# Patient Record
Sex: Female | Born: 1965 | Race: Black or African American | Hispanic: No | Marital: Single | Smoking: Former smoker
Health system: Southern US, Community
[De-identification: ages and names within clinical notes are randomized; demographics above are authoritative.]

## PROBLEM LIST (undated history)

## (undated) DIAGNOSIS — R1310 Dysphagia, unspecified: Secondary | ICD-10-CM

## (undated) DIAGNOSIS — G40909 Epilepsy, unspecified, not intractable, without status epilepticus: Secondary | ICD-10-CM

## (undated) DIAGNOSIS — K219 Gastro-esophageal reflux disease without esophagitis: Secondary | ICD-10-CM

## (undated) DIAGNOSIS — E119 Type 2 diabetes mellitus without complications: Secondary | ICD-10-CM

## (undated) DIAGNOSIS — I2699 Other pulmonary embolism without acute cor pulmonale: Secondary | ICD-10-CM

## (undated) DIAGNOSIS — E785 Hyperlipidemia, unspecified: Secondary | ICD-10-CM

## (undated) DIAGNOSIS — I82409 Acute embolism and thrombosis of unspecified deep veins of unspecified lower extremity: Secondary | ICD-10-CM

## (undated) DIAGNOSIS — I10 Essential (primary) hypertension: Secondary | ICD-10-CM

## (undated) DIAGNOSIS — I609 Nontraumatic subarachnoid hemorrhage, unspecified: Secondary | ICD-10-CM

## (undated) DIAGNOSIS — J9600 Acute respiratory failure, unspecified whether with hypoxia or hypercapnia: Secondary | ICD-10-CM

## (undated) DIAGNOSIS — K589 Irritable bowel syndrome without diarrhea: Secondary | ICD-10-CM

## (undated) DIAGNOSIS — R339 Retention of urine, unspecified: Secondary | ICD-10-CM

## (undated) HISTORY — PX: TRACHEOSTOMY: SUR1362

## (undated) HISTORY — PX: ANEURYSM COILING: SHX5349

## (undated) HISTORY — PX: ABDOMINAL SURGERY: SHX537

## (undated) HISTORY — DX: Other pulmonary embolism without acute cor pulmonale: I26.99

## (undated) HISTORY — DX: Nontraumatic subarachnoid hemorrhage, unspecified: I60.9

## (undated) HISTORY — DX: Acute embolism and thrombosis of unspecified deep veins of unspecified lower extremity: I82.409

---

## 2005-08-30 ENCOUNTER — Ambulatory Visit: Payer: Self-pay | Admitting: Internal Medicine

## 2005-08-30 ENCOUNTER — Ambulatory Visit (HOSPITAL_COMMUNITY): Admission: RE | Admit: 2005-08-30 | Discharge: 2005-09-02 | Payer: Self-pay | Admitting: Emergency Medicine

## 2005-08-30 ENCOUNTER — Inpatient Hospital Stay (HOSPITAL_COMMUNITY): Admission: EM | Admit: 2005-08-30 | Discharge: 2005-09-02 | Payer: Self-pay | Admitting: Emergency Medicine

## 2005-09-10 ENCOUNTER — Ambulatory Visit: Payer: Self-pay | Admitting: Internal Medicine

## 2005-09-18 ENCOUNTER — Encounter: Admission: RE | Admit: 2005-09-18 | Discharge: 2005-09-18 | Payer: Self-pay | Admitting: Obstetrics and Gynecology

## 2005-09-23 ENCOUNTER — Observation Stay (HOSPITAL_COMMUNITY): Admission: AD | Admit: 2005-09-23 | Discharge: 2005-09-24 | Payer: Self-pay | Admitting: Obstetrics and Gynecology

## 2005-09-26 ENCOUNTER — Ambulatory Visit: Payer: Self-pay | Admitting: Gynecology

## 2005-09-26 ENCOUNTER — Inpatient Hospital Stay (HOSPITAL_COMMUNITY): Admission: RE | Admit: 2005-09-26 | Discharge: 2005-09-28 | Payer: Self-pay | Admitting: Gynecology

## 2005-10-05 ENCOUNTER — Ambulatory Visit: Payer: Self-pay | Admitting: Gynecology

## 2005-10-25 ENCOUNTER — Ambulatory Visit: Payer: Self-pay | Admitting: Obstetrics & Gynecology

## 2015-01-13 ENCOUNTER — Encounter (HOSPITAL_COMMUNITY): Payer: Self-pay | Admitting: Nurse Practitioner

## 2015-01-13 ENCOUNTER — Encounter (HOSPITAL_COMMUNITY)
Admission: EM | Disposition: A | Discharge status: Skilled Nursing Facility | Payer: Self-pay | Source: Home / Self Care | Attending: Pulmonary Disease

## 2015-01-13 ENCOUNTER — Inpatient Hospital Stay (HOSPITAL_COMMUNITY)
Admission: EM | Admit: 2015-01-13 | Discharge: 2015-02-14 | DRG: 003 | Disposition: A | Discharge status: Skilled Nursing Facility | Payer: Medicaid Other | Attending: Internal Medicine | Admitting: Internal Medicine

## 2015-01-13 ENCOUNTER — Inpatient Hospital Stay (HOSPITAL_COMMUNITY): Payer: Medicaid Other

## 2015-01-13 ENCOUNTER — Inpatient Hospital Stay (HOSPITAL_COMMUNITY): Payer: Medicaid Other | Admitting: Anesthesiology

## 2015-01-13 ENCOUNTER — Emergency Department (HOSPITAL_COMMUNITY): Payer: Medicaid Other

## 2015-01-13 DIAGNOSIS — I119 Hypertensive heart disease without heart failure: Secondary | ICD-10-CM | POA: Diagnosis present

## 2015-01-13 DIAGNOSIS — I739 Peripheral vascular disease, unspecified: Secondary | ICD-10-CM | POA: Diagnosis present

## 2015-01-13 DIAGNOSIS — R1310 Dysphagia, unspecified: Secondary | ICD-10-CM | POA: Diagnosis present

## 2015-01-13 DIAGNOSIS — T380X5A Adverse effect of glucocorticoids and synthetic analogues, initial encounter: Secondary | ICD-10-CM | POA: Diagnosis present

## 2015-01-13 DIAGNOSIS — R748 Abnormal levels of other serum enzymes: Secondary | ICD-10-CM

## 2015-01-13 DIAGNOSIS — B963 Hemophilus influenzae [H. influenzae] as the cause of diseases classified elsewhere: Secondary | ICD-10-CM | POA: Diagnosis not present

## 2015-01-13 DIAGNOSIS — R110 Nausea: Secondary | ICD-10-CM

## 2015-01-13 DIAGNOSIS — D696 Thrombocytopenia, unspecified: Secondary | ICD-10-CM | POA: Diagnosis present

## 2015-01-13 DIAGNOSIS — R509 Fever, unspecified: Secondary | ICD-10-CM

## 2015-01-13 DIAGNOSIS — D649 Anemia, unspecified: Secondary | ICD-10-CM | POA: Diagnosis present

## 2015-01-13 DIAGNOSIS — I610 Nontraumatic intracerebral hemorrhage in hemisphere, subcortical: Secondary | ICD-10-CM | POA: Diagnosis not present

## 2015-01-13 DIAGNOSIS — R569 Unspecified convulsions: Secondary | ICD-10-CM | POA: Diagnosis present

## 2015-01-13 DIAGNOSIS — E872 Acidosis: Secondary | ICD-10-CM | POA: Diagnosis present

## 2015-01-13 DIAGNOSIS — F17200 Nicotine dependence, unspecified, uncomplicated: Secondary | ICD-10-CM | POA: Diagnosis present

## 2015-01-13 DIAGNOSIS — R7989 Other specified abnormal findings of blood chemistry: Secondary | ICD-10-CM | POA: Diagnosis not present

## 2015-01-13 DIAGNOSIS — J40 Bronchitis, not specified as acute or chronic: Secondary | ICD-10-CM | POA: Diagnosis not present

## 2015-01-13 DIAGNOSIS — I10 Essential (primary) hypertension: Secondary | ICD-10-CM | POA: Diagnosis not present

## 2015-01-13 DIAGNOSIS — A047 Enterocolitis due to Clostridium difficile: Secondary | ICD-10-CM | POA: Diagnosis not present

## 2015-01-13 DIAGNOSIS — Z66 Do not resuscitate: Secondary | ICD-10-CM | POA: Diagnosis not present

## 2015-01-13 DIAGNOSIS — I609 Nontraumatic subarachnoid hemorrhage, unspecified: Secondary | ICD-10-CM | POA: Insufficient documentation

## 2015-01-13 DIAGNOSIS — J9621 Acute and chronic respiratory failure with hypoxia: Secondary | ICD-10-CM | POA: Insufficient documentation

## 2015-01-13 DIAGNOSIS — I959 Hypotension, unspecified: Secondary | ICD-10-CM | POA: Diagnosis not present

## 2015-01-13 DIAGNOSIS — J1100 Influenza due to unidentified influenza virus with unspecified type of pneumonia: Secondary | ICD-10-CM | POA: Diagnosis not present

## 2015-01-13 DIAGNOSIS — G936 Cerebral edema: Secondary | ICD-10-CM | POA: Diagnosis not present

## 2015-01-13 DIAGNOSIS — J9602 Acute respiratory failure with hypercapnia: Secondary | ICD-10-CM | POA: Diagnosis present

## 2015-01-13 DIAGNOSIS — I161 Hypertensive emergency: Secondary | ICD-10-CM | POA: Diagnosis present

## 2015-01-13 DIAGNOSIS — E876 Hypokalemia: Secondary | ICD-10-CM | POA: Diagnosis present

## 2015-01-13 DIAGNOSIS — Z9289 Personal history of other medical treatment: Secondary | ICD-10-CM | POA: Insufficient documentation

## 2015-01-13 DIAGNOSIS — R40242 Glasgow coma scale score 9-12, unspecified time: Secondary | ICD-10-CM | POA: Diagnosis not present

## 2015-01-13 DIAGNOSIS — T829XXA Unspecified complication of cardiac and vascular prosthetic device, implant and graft, initial encounter: Secondary | ICD-10-CM | POA: Insufficient documentation

## 2015-01-13 DIAGNOSIS — R739 Hyperglycemia, unspecified: Secondary | ICD-10-CM | POA: Diagnosis present

## 2015-01-13 DIAGNOSIS — Z9114 Patient's other noncompliance with medication regimen: Secondary | ICD-10-CM | POA: Diagnosis not present

## 2015-01-13 DIAGNOSIS — R4020 Unspecified coma: Secondary | ICD-10-CM | POA: Diagnosis present

## 2015-01-13 DIAGNOSIS — I602 Nontraumatic subarachnoid hemorrhage from anterior communicating artery: Secondary | ICD-10-CM | POA: Diagnosis present

## 2015-01-13 DIAGNOSIS — Z452 Encounter for adjustment and management of vascular access device: Secondary | ICD-10-CM

## 2015-01-13 DIAGNOSIS — B9620 Unspecified Escherichia coli [E. coli] as the cause of diseases classified elsewhere: Secondary | ICD-10-CM | POA: Diagnosis not present

## 2015-01-13 DIAGNOSIS — R339 Retention of urine, unspecified: Secondary | ICD-10-CM | POA: Diagnosis not present

## 2015-01-13 DIAGNOSIS — I619 Nontraumatic intracerebral hemorrhage, unspecified: Secondary | ICD-10-CM

## 2015-01-13 DIAGNOSIS — J9601 Acute respiratory failure with hypoxia: Secondary | ICD-10-CM | POA: Diagnosis present

## 2015-01-13 DIAGNOSIS — I248 Other forms of acute ischemic heart disease: Secondary | ICD-10-CM | POA: Diagnosis present

## 2015-01-13 DIAGNOSIS — Z4659 Encounter for fitting and adjustment of other gastrointestinal appliance and device: Secondary | ICD-10-CM

## 2015-01-13 DIAGNOSIS — N390 Urinary tract infection, site not specified: Secondary | ICD-10-CM | POA: Diagnosis not present

## 2015-01-13 DIAGNOSIS — J9690 Respiratory failure, unspecified, unspecified whether with hypoxia or hypercapnia: Secondary | ICD-10-CM

## 2015-01-13 DIAGNOSIS — G919 Hydrocephalus, unspecified: Secondary | ICD-10-CM

## 2015-01-13 DIAGNOSIS — G9340 Encephalopathy, unspecified: Secondary | ICD-10-CM

## 2015-01-13 DIAGNOSIS — R29818 Other symptoms and signs involving the nervous system: Secondary | ICD-10-CM | POA: Insufficient documentation

## 2015-01-13 DIAGNOSIS — Z930 Tracheostomy status: Secondary | ICD-10-CM | POA: Insufficient documentation

## 2015-01-13 DIAGNOSIS — R4182 Altered mental status, unspecified: Secondary | ICD-10-CM | POA: Diagnosis present

## 2015-01-13 HISTORY — DX: Essential (primary) hypertension: I10

## 2015-01-13 HISTORY — PX: RADIOLOGY WITH ANESTHESIA: SHX6223

## 2015-01-13 LAB — LACTIC ACID, PLASMA: LACTIC ACID, VENOUS: 5.5 mmol/L — AB (ref 0.5–2.0)

## 2015-01-13 LAB — COMPREHENSIVE METABOLIC PANEL
ALBUMIN: 4 g/dL (ref 3.5–5.0)
ALK PHOS: 104 U/L (ref 38–126)
ALT: 26 U/L (ref 14–54)
AST: 33 U/L (ref 15–41)
Anion gap: 16 — ABNORMAL HIGH (ref 5–15)
BILIRUBIN TOTAL: 0.6 mg/dL (ref 0.3–1.2)
BUN: 13 mg/dL (ref 6–20)
CALCIUM: 9.5 mg/dL (ref 8.9–10.3)
CO2: 20 mmol/L — AB (ref 22–32)
CREATININE: 1.11 mg/dL — AB (ref 0.44–1.00)
Chloride: 102 mmol/L (ref 101–111)
GFR calc Af Amer: >60 mL/min (ref >60)
GFR calc non Af Amer: 56 mL/min — ABNORMAL LOW (ref >60)
GLUCOSE: 262 mg/dL — AB (ref 65–99)
Potassium: 3.1 mmol/L — ABNORMAL LOW (ref 3.5–5.1)
SODIUM: 138 mmol/L (ref 135–145)
TOTAL PROTEIN: 7.2 g/dL (ref 6.5–8.1)

## 2015-01-13 LAB — URINALYSIS, ROUTINE W REFLEX MICROSCOPIC
Bilirubin Urine: NEGATIVE
GLUCOSE, UA: 500 mg/dL — AB
Ketones, ur: NEGATIVE mg/dL
LEUKOCYTES UA: NEGATIVE
Nitrite: NEGATIVE
PH: 6 (ref 5.0–8.0)
SPECIFIC GRAVITY, URINE: 1.011 (ref 1.005–1.030)

## 2015-01-13 LAB — RAPID URINE DRUG SCREEN, HOSP PERFORMED
AMPHETAMINES: NOT DETECTED
Barbiturates: NOT DETECTED
Benzodiazepines: POSITIVE — AB
COCAINE: NOT DETECTED
OPIATES: NOT DETECTED
TETRAHYDROCANNABINOL: POSITIVE — AB

## 2015-01-13 LAB — CBC WITH DIFFERENTIAL/PLATELET
BASOS ABS: 0.2 10*3/uL — AB (ref 0.0–0.1)
BASOS PCT: 1 %
EOS ABS: 0.2 10*3/uL (ref 0.0–0.7)
Eosinophils Relative: 1 %
HCT: 44.1 % (ref 36.0–46.0)
HEMOGLOBIN: 14.4 g/dL (ref 12.0–15.0)
LYMPHS ABS: 6.5 10*3/uL — AB (ref 0.7–4.0)
LYMPHS PCT: 43 %
MCH: 31.1 pg (ref 26.0–34.0)
MCHC: 32.7 g/dL (ref 30.0–36.0)
MCV: 95.2 fL (ref 78.0–100.0)
MONO ABS: 1.2 10*3/uL — AB (ref 0.1–1.0)
Monocytes Relative: 8 %
NEUTROS ABS: 6.9 10*3/uL (ref 1.7–7.7)
Neutrophils Relative %: 47 %
Platelets: 246 10*3/uL (ref 150–400)
RBC: 4.63 MIL/uL (ref 3.87–5.11)
RDW: 14.2 % (ref 11.5–15.5)
WBC: 15 10*3/uL — ABNORMAL HIGH (ref 4.0–10.5)

## 2015-01-13 LAB — CBC
HCT: 43.2 % (ref 36.0–46.0)
HEMOGLOBIN: 14.1 g/dL (ref 12.0–15.0)
MCH: 31.1 pg (ref 26.0–34.0)
MCHC: 32.6 g/dL (ref 30.0–36.0)
MCV: 95.2 fL (ref 78.0–100.0)
Platelets: 214 10*3/uL (ref 150–400)
RBC: 4.54 MIL/uL (ref 3.87–5.11)
RDW: 14.2 % (ref 11.5–15.5)
WBC: 25.8 10*3/uL — ABNORMAL HIGH (ref 4.0–10.5)

## 2015-01-13 LAB — I-STAT ARTERIAL BLOOD GAS, ED
ACID-BASE DEFICIT: 9 mmol/L — AB (ref 0.0–2.0)
BICARBONATE: 21.1 meq/L (ref 20.0–24.0)
O2 SAT: 92 %
PO2 ART: 82 mmHg (ref 80.0–100.0)
Patient temperature: 36.6
TCO2: 23 mmol/L (ref 0–100)
pCO2 arterial: 59.8 mmHg (ref 35.0–45.0)
pH, Arterial: 7.152 — CL (ref 7.350–7.450)

## 2015-01-13 LAB — POCT I-STAT 7, (LYTES, BLD GAS, ICA,H+H)
ACID-BASE DEFICIT: 5 mmol/L — AB (ref 0.0–2.0)
BICARBONATE: 20 meq/L (ref 20.0–24.0)
Calcium, Ion: 1.06 mmol/L — ABNORMAL LOW (ref 1.12–1.23)
HCT: 37 % (ref 36.0–46.0)
Hemoglobin: 12.6 g/dL (ref 12.0–15.0)
O2 SAT: 99 %
PCO2 ART: 34.9 mmHg — AB (ref 35.0–45.0)
PH ART: 7.367 (ref 7.350–7.450)
PO2 ART: 122 mmHg — AB (ref 80.0–100.0)
POTASSIUM: 4.1 mmol/L (ref 3.5–5.1)
Sodium: 140 mmol/L (ref 135–145)
TCO2: 21 mmol/L (ref 0–100)

## 2015-01-13 LAB — PROTIME-INR
INR: 1.07 (ref 0.00–1.49)
PROTHROMBIN TIME: 14.1 s (ref 11.6–15.2)

## 2015-01-13 LAB — URINE MICROSCOPIC-ADD ON: Bacteria, UA: NONE SEEN

## 2015-01-13 LAB — PHOSPHORUS: Phosphorus: 2.3 mg/dL — ABNORMAL LOW (ref 2.5–4.6)

## 2015-01-13 LAB — MRSA PCR SCREENING: MRSA BY PCR: NEGATIVE

## 2015-01-13 LAB — I-STAT TROPONIN, ED: Troponin i, poc: 0.07 ng/mL (ref 0.00–0.08)

## 2015-01-13 LAB — MAGNESIUM: Magnesium: 1.7 mg/dL (ref 1.7–2.4)

## 2015-01-13 LAB — APTT: APTT: 28 s (ref 24–37)

## 2015-01-13 LAB — TROPONIN I
TROPONIN I: 2.29 ng/mL — AB (ref <0.031)
Troponin I: 1.51 ng/mL (ref <0.031)

## 2015-01-13 LAB — I-STAT CG4 LACTIC ACID, ED: LACTIC ACID, VENOUS: 7.95 mmol/L — AB (ref 0.5–2.0)

## 2015-01-13 SURGERY — RADIOLOGY WITH ANESTHESIA
Anesthesia: General

## 2015-01-13 MED ORDER — PROPOFOL 1000 MG/100ML IV EMUL
5.0000 ug/kg/min | Freq: Once | INTRAVENOUS | Status: DC
  Administered 2015-01-13: 10 ug/kg/min via INTRAVENOUS

## 2015-01-13 MED ORDER — LEVETIRACETAM 500 MG/5ML IV SOLN
500.0000 mg | Freq: Two times a day (BID) | INTRAVENOUS | Status: DC
  Filled 2015-01-13: qty 5

## 2015-01-13 MED ORDER — ETOMIDATE 2 MG/ML IV SOLN
INTRAVENOUS | Status: AC | PRN
  Administered 2015-01-13: 25 mg via INTRAVENOUS

## 2015-01-13 MED ORDER — ANTISEPTIC ORAL RINSE SOLUTION (CORINZ)
7.0000 mL | OROMUCOSAL | Status: DC
  Administered 2015-01-13 – 2015-01-28 (×144): 7 mL via OROMUCOSAL

## 2015-01-13 MED ORDER — NICARDIPINE HCL IN NACL 20-0.86 MG/200ML-% IV SOLN
INTRAVENOUS | Status: AC
  Administered 2015-01-13: 7.5 mg
  Filled 2015-01-13: qty 200

## 2015-01-13 MED ORDER — ONDANSETRON HCL 4 MG/2ML IJ SOLN
INTRAMUSCULAR | Status: AC
  Filled 2015-01-13: qty 2

## 2015-01-13 MED ORDER — IOHEXOL 300 MG/ML  SOLN
300.0000 mL | Freq: Once | INTRAMUSCULAR | Status: AC | PRN
  Administered 2015-01-13: 80 mL via INTRAVENOUS

## 2015-01-13 MED ORDER — PROPOFOL 1000 MG/100ML IV EMUL
INTRAVENOUS | Status: AC
  Administered 2015-01-13: 40 ug/kg/min via INTRAVENOUS
  Filled 2015-01-13: qty 100

## 2015-01-13 MED ORDER — SODIUM CHLORIDE 0.9 % IV SOLN
500.0000 mg | Freq: Two times a day (BID) | INTRAVENOUS | Status: DC
  Administered 2015-01-13 – 2015-01-15 (×4): 500 mg via INTRAVENOUS
  Filled 2015-01-13 (×5): qty 5

## 2015-01-13 MED ORDER — PANTOPRAZOLE SODIUM 40 MG IV SOLR
40.0000 mg | Freq: Every day | INTRAVENOUS | Status: DC
  Administered 2015-01-13 – 2015-01-14 (×2): 40 mg via INTRAVENOUS
  Filled 2015-01-13 (×2): qty 40

## 2015-01-13 MED ORDER — CEFAZOLIN SODIUM 1-5 GM-% IV SOLN
1.0000 g | Freq: Three times a day (TID) | INTRAVENOUS | Status: DC
  Administered 2015-01-13: 2 g via INTRAVENOUS
  Administered 2015-01-14 – 2015-01-15 (×5): 1 g via INTRAVENOUS
  Filled 2015-01-13 (×8): qty 50

## 2015-01-13 MED ORDER — DEXAMETHASONE SODIUM PHOSPHATE 4 MG/ML IJ SOLN
4.0000 mg | Freq: Four times a day (QID) | INTRAMUSCULAR | Status: DC
  Administered 2015-01-13 – 2015-01-27 (×55): 4 mg via INTRAVENOUS
  Filled 2015-01-13 (×55): qty 1

## 2015-01-13 MED ORDER — CHLORHEXIDINE GLUCONATE 0.12% ORAL RINSE (MEDLINE KIT)
15.0000 mL | Freq: Two times a day (BID) | OROMUCOSAL | Status: DC
  Administered 2015-01-13 – 2015-01-27 (×29): 15 mL via OROMUCOSAL

## 2015-01-13 MED ORDER — PROPOFOL 10 MG/ML IV BOLUS
INTRAVENOUS | Status: AC | PRN
  Administered 2015-01-13: 30 mg via INTRAVENOUS

## 2015-01-13 MED ORDER — DEXAMETHASONE SODIUM PHOSPHATE 4 MG/ML IJ SOLN: 4.0000 mg | Freq: Four times a day (QID) | INTRAMUSCULAR | Status: DC

## 2015-01-13 MED ORDER — CEFAZOLIN (ANCEF) 1 G IV SOLR: 1.0000 g | Freq: Three times a day (TID) | INTRAVENOUS | Status: DC

## 2015-01-13 MED ORDER — NICARDIPINE HCL IN NACL 20-0.86 MG/200ML-% IV SOLN
3.0000 mg/h | INTRAVENOUS | Status: DC
  Administered 2015-01-13: 7.5 mg/h via INTRAVENOUS
  Administered 2015-01-14: 10 mg/h via INTRAVENOUS
  Filled 2015-01-13 (×2): qty 200

## 2015-01-13 MED ORDER — DEXTROSE 5 % IV SOLN
10.0000 mg | INTRAVENOUS | Status: DC | PRN
  Administered 2015-01-13: 25 ug/min via INTRAVENOUS

## 2015-01-13 MED ORDER — ALBUMIN HUMAN 25 % IV SOLN
25.0000 g | Freq: Four times a day (QID) | INTRAVENOUS | Status: DC
  Administered 2015-01-13 – 2015-01-14 (×3): 25 g via INTRAVENOUS
  Filled 2015-01-13 (×11): qty 100

## 2015-01-13 MED ORDER — NICARDIPINE HCL IN NACL 20-0.86 MG/200ML-% IV SOLN
3.0000 mg/h | Freq: Once | INTRAVENOUS | Status: AC
  Administered 2015-01-13: 5 mg/h via INTRAVENOUS
  Filled 2015-01-13: qty 200

## 2015-01-13 MED ORDER — NICARDIPINE HCL IN NACL 20-0.86 MG/200ML-% IV SOLN
INTRAVENOUS | Status: DC | PRN
  Administered 2015-01-13 (×2): 5 mg/h via INTRAVENOUS

## 2015-01-13 MED ORDER — FENTANYL CITRATE (PF) 100 MCG/2ML IJ SOLN
100.0000 ug | Freq: Once | INTRAMUSCULAR | Status: AC
  Administered 2015-01-13: 100 ug via INTRAVENOUS

## 2015-01-13 MED ORDER — MANNITOL 25 % IV SOLN
25.0000 g | Freq: Four times a day (QID) | INTRAVENOUS | Status: DC
Start: 2015-01-13 — End: 2015-01-14
  Administered 2015-01-13 – 2015-01-14 (×2): 25 g via INTRAVENOUS
  Filled 2015-01-13: qty 50
  Filled 2015-01-13: qty 100
  Filled 2015-01-13 (×2): qty 50

## 2015-01-13 MED ORDER — SODIUM BICARBONATE 4.2 % IV SOLN
INTRAVENOUS | Status: DC | PRN
  Administered 2015-01-13: 50 meq via INTRAVENOUS

## 2015-01-13 MED ORDER — DILTIAZEM HCL 100 MG IV SOLR
INTRAVENOUS | Status: AC
  Filled 2015-01-13: qty 100

## 2015-01-13 MED ORDER — ROCURONIUM BROMIDE 50 MG/5ML IV SOLN
INTRAVENOUS | Status: AC | PRN
  Administered 2015-01-13: 80 mg via INTRAVENOUS

## 2015-01-13 MED ORDER — DEXAMETHASONE SODIUM PHOSPHATE 10 MG/ML IJ SOLN
10.0000 mg | Freq: Once | INTRAMUSCULAR | Status: AC
  Administered 2015-01-13: 10 mg via INTRAVENOUS
  Filled 2015-01-13: qty 1

## 2015-01-13 MED ORDER — CEFAZOLIN SODIUM-DEXTROSE 2-3 GM-% IV SOLR
2.0000 g | Freq: Once | INTRAVENOUS | Status: AC
  Administered 2015-01-13: 2 g via INTRAVENOUS
  Filled 2015-01-13: qty 50

## 2015-01-13 MED ORDER — FENTANYL CITRATE (PF) 100 MCG/2ML IJ SOLN
INTRAMUSCULAR | Status: AC
  Filled 2015-01-13: qty 2

## 2015-01-13 MED ORDER — IODIXANOL 320 MG/ML IV SOLN
22.0000 mL | Freq: Once | INTRAVENOUS | Status: AC | PRN
  Administered 2015-01-13: 100 mL via INTRA_ARTERIAL

## 2015-01-13 MED ORDER — FENTANYL CITRATE (PF) 100 MCG/2ML IJ SOLN
INTRAMUSCULAR | Status: DC | PRN
  Administered 2015-01-13: 100 ug via INTRAVENOUS
  Administered 2015-01-13: 150 ug via INTRAVENOUS

## 2015-01-13 MED ORDER — SODIUM CHLORIDE 0.9 % IV BOLUS (SEPSIS)
1000.0000 mL | Freq: Once | INTRAVENOUS | Status: AC
  Administered 2015-01-13: 1000 mL via INTRAVENOUS

## 2015-01-13 MED ORDER — MIDAZOLAM HCL 5 MG/ML IJ SOLN
2.0000 mg | Freq: Once | INTRAMUSCULAR | Status: AC
  Administered 2015-01-13: 2 mg via INTRAVENOUS
  Filled 2015-01-13: qty 1

## 2015-01-13 MED ORDER — CEFAZOLIN (ANCEF) 1 G IV SOLR: 2.0000 g | Freq: Once | INTRAVENOUS | Status: DC

## 2015-01-13 MED ORDER — PROPOFOL 1000 MG/100ML IV EMUL
5.0000 ug/kg/min | INTRAVENOUS | Status: DC
  Administered 2015-01-13: 60 ug/kg/min via INTRAVENOUS
  Administered 2015-01-13: 40 ug/kg/min via INTRAVENOUS
  Administered 2015-01-14 (×2): 50 ug/kg/min via INTRAVENOUS
  Administered 2015-01-14: 30 ug/kg/min via INTRAVENOUS
  Administered 2015-01-14: 60 ug/kg/min via INTRAVENOUS
  Administered 2015-01-15 (×2): 40 ug/kg/min via INTRAVENOUS
  Filled 2015-01-13 (×8): qty 100

## 2015-01-13 MED ORDER — DILTIAZEM HCL 100 MG IV SOLR: 5.0000 mg/h | Freq: Once | INTRAVENOUS | Status: DC

## 2015-01-13 MED ORDER — ROCURONIUM BROMIDE 100 MG/10ML IV SOLN
INTRAVENOUS | Status: DC | PRN
  Administered 2015-01-13: 25 mg via INTRAVENOUS
  Administered 2015-01-13 (×2): 50 mg via INTRAVENOUS

## 2015-01-13 MED ORDER — MIDAZOLAM HCL 2 MG/2ML IJ SOLN
INTRAMUSCULAR | Status: AC
  Filled 2015-01-13: qty 2

## 2015-01-13 MED ORDER — PROPOFOL 500 MG/50ML IV EMUL
INTRAVENOUS | Status: DC | PRN
  Administered 2015-01-13: 25 ug/kg/min via INTRAVENOUS

## 2015-01-13 MED ORDER — SODIUM CHLORIDE 0.9 % IV SOLN
INTRAVENOUS | Status: DC
  Administered 2015-01-13 – 2015-01-14 (×4): via INTRAVENOUS
  Administered 2015-01-15: 100 mL/h via INTRAVENOUS
  Administered 2015-01-16 – 2015-01-29 (×16): via INTRAVENOUS
  Administered 2015-01-30: 150 mL/h via INTRAVENOUS
  Administered 2015-01-30 – 2015-02-07 (×8): via INTRAVENOUS

## 2015-01-13 MED ORDER — SODIUM CHLORIDE 0.9 % IV SOLN
1000.0000 mg | Freq: Once | INTRAVENOUS | Status: AC
  Administered 2015-01-13: 1000 mg via INTRAVENOUS
  Filled 2015-01-13: qty 10

## 2015-01-13 MED ORDER — SODIUM CHLORIDE 0.9 % IV SOLN
250.0000 mL | INTRAVENOUS | Status: DC | PRN
  Administered 2015-01-13: 16:00:00 via INTRAVENOUS

## 2015-01-13 NOTE — Progress Notes (Signed)
Pt transported to IR on vent w/ no apparent complications, reported off to anesthesia-Richard, pt placed on anesthesia vent for procedure.

## 2015-01-13 NOTE — ED Provider Notes (Signed)
CSN: [id number]     Arrival date & time 01/13/15  1052 History   First MD Initiated Contact with Patient 01/13/15 1113     Chief Complaint  Patient presents with  . Altered Mental Status     (Consider location/radiation/quality/duration/timing/severity/associated sxs/prior Treatment) HPI Comments: 49 year old female with unknown past medical history who presents unresponsive. History limited because of the patient's AMS and obtained from EMS. They report that they were called to the scene for a neighbor finding the patient down on the ground. When they arrived, the patient was unresponsive outside of her home with her jaw clenched shut. They noticed unequal pupils. They attempted nasal intubation because of the patient's rigidity without success, so they placed a nasal trumpet and transported here. Patient has intermittently had decerebrate posturing during transport.  LEVEL 5 CAVEAT APPLIES 2/2 UNRESPONSIVENESS  The history is provided by the EMS personnel.    No past medical history on file. No past surgical history on file. No family history on file. Social History  Substance Use Topics  . Smoking status: Not on file  . Smokeless tobacco: Not on file  . Alcohol Use: Not on file   OB History    No data available     Review of Systems  Unable to perform ROS: Patient unresponsive      Allergies  Review of patient's allergies indicates not on file.  Home Medications   Prior to Admission medications   Not on File   BP 190/139 mmHg  Pulse 138  Temp(Src) 98.1 F (36.7 C)  Resp 14  Wt 160 lb (72.576 kg)  SpO2 94% Physical Exam  Constitutional: She appears well-developed and well-nourished.  Sonorous respirations, unresponsive, vomiting  HENT:  Head: Normocephalic and atraumatic.  Moist mucous membranes  Eyes: Conjunctivae are normal.  L pupil fixed and dilated, R pupil miotic  Neck:  In c-collar  Cardiovascular: Regular rhythm and normal heart sounds.   No  murmur heard. tachycardic  Pulmonary/Chest:  Mildly increased WOB, equal breath sounds, coarse  Abdominal: Soft. Bowel sounds are normal. She exhibits no distension. There is no tenderness.  Musculoskeletal: She exhibits no edema.  Neurological:  Unresponsive, occasional decerebrate posturing with R arm, pupils unequal, jaw clenched shut   Skin: Skin is warm and dry. No rash noted.  Nursing note and vitals reviewed.   ED Course  .Intubation Date/Time: 01/13/2015 12:45 PM Performed by: Laurence Spates Authorized by: Laurence Spates Consent: The procedure was performed in an emergent situation. Verbal consent not obtained. Indications: airway protection and  respiratory failure Intubation method: direct Patient status: paralyzed (RSI) Preoxygenation: nonrebreather mask Sedatives: etomidate Paralytic: rocuronium Laryngoscope size: Mac 4 Tube size: 7.5 mm Tube type: cuffed Number of attempts: 1 Cords visualized: yes Post-procedure assessment: chest rise,  ETCO2 monitor and CO2 detector Breath sounds: equal Cuff inflated: yes ETT to teeth: 23 cm Tube secured with: ETT holder Chest x-ray interpreted by me. Chest x-ray findings: endotracheal tube in appropriate position Patient tolerance: Patient tolerated the procedure well with no immediate complications  CRITICAL CARE Performed by: Ambrose Finland Bashir Marchetti   Total critical care time: 60 minutes  Critical care time was exclusive of separately billable procedures and treating other patients.  Critical care was necessary to treat or prevent imminent or life-threatening deterioration.  Critical care was time spent personally by me on the following activities: development of treatment plan with patient and/or surrogate as well as nursing, discussions with consultants, evaluation of patient's response to treatment,  examination of patient, obtaining history from patient or surrogate, ordering and performing treatments and  interventions, ordering and review of laboratory studies, ordering and review of radiographic studies, pulse oximetry and re-evaluation of patient's condition.    (including critical care time) Labs Review Labs Reviewed  COMPREHENSIVE METABOLIC PANEL - Abnormal; Notable for the following:    Potassium 3.1 (*)    CO2 20 (*)    Glucose, Bld 262 (*)    Creatinine, Ser 1.11 (*)    GFR calc non Af Amer 56 (*)    Anion gap 16 (*)    All other components within normal limits  CBC WITH DIFFERENTIAL/PLATELET - Abnormal; Notable for the following:    WBC 15.0 (*)    All other components within normal limits  I-STAT CG4 LACTIC ACID, ED - Abnormal; Notable for the following:    Lactic Acid, Venous 7.95 (*)    All other components within normal limits  PROTIME-INR  APTT  BLOOD GAS, ARTERIAL  URINALYSIS, ROUTINE W REFLEX MICROSCOPIC (NOT AT [facility])  MAGNESIUM  PHOSPHORUS  TROPONIN I  TROPONIN I  TROPONIN I  LACTIC ACID, PLASMA  CBC  URINE RAPID DRUG SCREEN, HOSP PERFORMED  I-STAT TROPOININ, ED    Imaging Review Ct Head Wo Contrast  01/13/2015  CLINICAL DATA:  Found down outside by neighbors after going to the grocery store, elevated blood pressure at presentation EXAM: CT HEAD WITHOUT CONTRAST CT CERVICAL SPINE WITHOUT CONTRAST TECHNIQUE: Multidetector CT imaging of the head and cervical spine was performed following the standard protocol without intravenous contrast. Multiplanar CT image reconstructions of the cervical spine were also generated. COMPARISON:  None FINDINGS: CT HEAD FINDINGS Normal ventricular morphology. Extensive high attenuation subarachnoid hemorrhage identified throughout sulci, sylvian fissures, basilar cisterns, and interhemispheric fissure particularly anteriorly. Findings are highly suspicious for ruptured intracranial aneurysm. No intraparenchymal hemorrhage, mass lesion or evidence acute infarction identified. Suspect minimal blood within fourth ventricle. Visualized  paranasal sinuses and mastoid air cells clear. Calvarial thickening superolateral LEFT orbit, of uncertain chronicity and etiology. No other osseous abnormalities. CT CERVICAL SPINE FINDINGS Endotracheal tube present anterior to cervical spine. Prevertebral soft tissues normal thickness. Vertebral body heights maintained. Minimal disc space narrowing and endplate spur formation C5-C6. No fracture, subluxation or bone destruction. Visualized skullbase intact. Interseptal thickening at lung apices. Subarachnoid hemorrhage extends into the upper cervical spinal canal. IMPRESSION: Extensive high attenuation acute subarachnoid hemorrhage throughout basilar cisterns, sylvian fissures, interhemispheric fissure, and BILATERAL cortical sulci high suspicious for ruptured intracranial aneurysm. No acute intraparenchymal brain abnormalities. Minimal degenerative disc disease changes cervical spine. No acute cervical spine abnormalities. Critical Value/emergent results were called by telephone at the time of interpretation on 01/13/2015 at 1131 hr to Dr. Frederick Peers , who verbally acknowledged these results. Electronically Signed   By: Ulyses Southward M.D.   On: 01/13/2015 11:50   Ct Cervical Spine Wo Contrast  01/13/2015  CLINICAL DATA:  Found down outside by neighbors after going to the grocery store, elevated blood pressure at presentation EXAM: CT HEAD WITHOUT CONTRAST CT CERVICAL SPINE WITHOUT CONTRAST TECHNIQUE: Multidetector CT imaging of the head and cervical spine was performed following the standard protocol without intravenous contrast. Multiplanar CT image reconstructions of the cervical spine were also generated. COMPARISON:  None FINDINGS: CT HEAD FINDINGS Normal ventricular morphology. Extensive high attenuation subarachnoid hemorrhage identified throughout sulci, sylvian fissures, basilar cisterns, and interhemispheric fissure particularly anteriorly. Findings are highly suspicious for ruptured intracranial  aneurysm. No intraparenchymal hemorrhage, mass lesion or evidence  acute infarction identified. Suspect minimal blood within fourth ventricle. Visualized paranasal sinuses and mastoid air cells clear. Calvarial thickening superolateral LEFT orbit, of uncertain chronicity and etiology. No other osseous abnormalities. CT CERVICAL SPINE FINDINGS Endotracheal tube present anterior to cervical spine. Prevertebral soft tissues normal thickness. Vertebral body heights maintained. Minimal disc space narrowing and endplate spur formation C5-C6. No fracture, subluxation or bone destruction. Visualized skullbase intact. Interseptal thickening at lung apices. Subarachnoid hemorrhage extends into the upper cervical spinal canal. IMPRESSION: Extensive high attenuation acute subarachnoid hemorrhage throughout basilar cisterns, sylvian fissures, interhemispheric fissure, and BILATERAL cortical sulci high suspicious for ruptured intracranial aneurysm. No acute intraparenchymal brain abnormalities. Minimal degenerative disc disease changes cervical spine. No acute cervical spine abnormalities. Critical Value/emergent results were called by telephone at the time of interpretation on 01/13/2015 at 1131 hr to Dr. Frederick PeersACHEL Birdell Frasier , who verbally acknowledged these results. Electronically Signed   By: Ulyses SouthwardMark  Boles M.D.   On: 01/13/2015 11:50   Dg Chest Portable 1 View  01/13/2015  CLINICAL DATA:  Check endotracheal tube placement EXAM: PORTABLE CHEST - 1 VIEW COMPARISON:  08/30/2005 FINDINGS: Cardiac shadow remains enlarged. An endotracheal tube is noted 4 cm above the carina. The lungs are well aerated bilaterally with mild peribronchial cuffing bilaterally which may represent bronchitis. No focal confluent infiltrate is seen. IMPRESSION: Endotracheal tube in satisfactory position. Mild central peribronchial cuffing Electronically Signed   By: Alcide CleverMark  Lukens M.D.   On: 01/13/2015 12:02   I have personally reviewed and evaluated these  images and lab results as part of my medical decision-making.   EKG Interpretation None     Medications  ondansetron (ZOFRAN) 4 MG/2ML injection (not administered)  dextrose 5 % with diltiazem (CARDIZEM) ADS Med (not administered)  propofol (DIPRIVAN) 1000 MG/100ML infusion (not administered)  midazolam (VERSED) 2 MG/2ML injection (not administered)  fentaNYL (SUBLIMAZE) 100 MCG/2ML injection (not administered)  dexamethasone (DECADRON) injection 10 mg (not administered)  levETIRAcetam (KEPPRA) 1,000 mg in sodium chloride 0.9 % 100 mL IVPB (not administered)  propofol (DIPRIVAN) 1000 MG/100ML infusion (not administered)  0.9 %  sodium chloride infusion (not administered)  0.9 %  sodium chloride infusion (not administered)  pantoprazole (PROTONIX) injection 40 mg (not administered)  fentaNYL (SUBLIMAZE) injection 100 mcg (100 mcg Intravenous Given 01/13/15 1144)  midazolam (VERSED) injection 2 mg (2 mg Intravenous Given 01/13/15 1143)  nicardipine (CARDENE) 20mg  in 0.86% saline 200ml IV infusion (0.1 mg/ml) (7.5 mg/hr Intravenous Rate/Dose Change 01/13/15 1203)  etomidate (AMIDATE) injection (25 mg Intravenous Given 01/13/15 1103)  rocuronium (ZEMURON) injection (80 mg Intravenous Given 01/13/15 1103)  sodium chloride 0.9 % bolus 1,000 mL (0 mLs Intravenous Stopped 01/13/15 1203)  propofol (DIPRIVAN) 10 mg/mL bolus/IV push ( Intravenous Stopped 01/13/15 1136)    MDM   Final diagnoses:  Subarachnoid hemorrhage (HCC)  Acute respiratory failure with hypoxia and hypercapnia (HCC)  Lactic acidosis  Pt presents via EMS unresponsive after being found down outside by a neighbor. Unknown PMH. On arrival by EMS, the patient was unresponsive with sonorous respirations, she immediately began vomiting and we rolled her to her side. Vital signs notable for severe hypertension and tachycardia. She had unequal pupils with dilated left pupil and decerebrate posturing of right arm. Because of her low  GCS, I immediately prepared for intubation with preoxygenation with nonrebreather. IV established and patient given rocuronium and etomidate for RSI; see procedure note for details. Obtained portable CXR to confirm tube placement. 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 Started propofol drip, nicardipine and contacted critical care and neurosurgery. Labs notable for initial lactate 7.95, pH 7.15 CO2 60-- increased RR on vent. AG 16, likely due to lactic acidosis in combination w/ respiratory acidosis.  Discussed case w/ Dr. Bevely Palmer, NSGY, and per his instruction gave IV decadron and keppra, ordered CTA head. On multiple re-examinations, the patient's BP and HR continue to improve with titration of propofol and nicardipine. I discussed the patient's presentation and findings with her cousin and later with the patient's son. Discussed with critical care and patient admitted in critical condition.  Laurence Spates, MD 01/13/15 [phone]

## 2015-01-13 NOTE — Transfer of Care (Signed)
Immediate Anesthesia Transfer of Care Note  Patient: Rachel Vang  Procedure(s) Performed: Procedure(s): RADIOLOGY WITH ANESTHESIA (N/A)  Patient Location: NICU  Anesthesia Type:General  Level of Consciousness: sedated, unresponsive and Patient remains intubated per anesthesia plan  Airway & Oxygen Therapy: Patient remains intubated per anesthesia plan and Patient placed on Ventilator (see vital sign flow sheet for setting)  Post-op Assessment: Report given to RN and Post -op Vital signs reviewed and stable  Post vital signs: Reviewed and stable  Last Vitals:  Filed Vitals:   01/13/15 1545 01/13/15 1600  BP: 99/68   Pulse: 106 107  Temp: 37.2 C 37.3 C  Resp: 24 24    Complications: No apparent anesthesia complications

## 2015-01-13 NOTE — Procedures (Signed)
Central Venous Catheter Insertion Procedure Note Lucienne MinksStephanie N Paino [id number] 02/17/1965  Procedure: Insertion of Central Venous Catheter Indications: Assessment of intravascular volume, Drug and/or fluid administration and Frequent blood sampling  Procedure Details Consent: Risks of procedure as well as the alternatives and risks of each were explained to the (patient/caregiver).  Consent for procedure obtained. Time Out: Verified patient identification, verified procedure, site/side was marked, verified correct patient position, special equipment/implants available, medications/allergies/relevent history reviewed, required imaging and test results available.  Performed  Maximum sterile technique was used including antiseptics, cap, gloves, gown, hand hygiene, mask and sheet. Skin prep: Chlorhexidine; local anesthetic administered A antimicrobial bonded/coated triple lumen catheter was placed in the left internal jugular vein using the Seldinger technique. Ultrasound guidance used.Yes.   Catheter placed to 20 cm. Blood aspirated via all 3 ports and then flushed x 3. Line sutured x 2 and dressing applied.  Evaluation Blood flow good Complications: No apparent complications Patient did tolerate procedure well. Chest X-ray ordered to verify placement.  CXR: pending.  Brett CanalesSteve Minor ACNP [facility] Pager [phone] till 3 pm If no answer page [phone] 01/13/2015, 12:51 PM  Alyson ReedyWesam G. Yacoub, M.D. [facility]. Pager: [phone]. After hours pager: [phone].

## 2015-01-13 NOTE — Progress Notes (Signed)
Pt transported to IR with [place], RN Jonny RuizJohn, RN , Wyvonnia DuskyJ Koch RT, Respiratory Therapy and anesthesia present at bedside for transport. No complications upon arrival. Received report from Jonny RuizJohn, 3MW RN. Pt vitals stable at this time.

## 2015-01-13 NOTE — ED Notes (Signed)
Pt here found down outside unresponsive pupils unequal at her house , EMS attempted nasal intubation without success pt arrived being bagged

## 2015-01-13 NOTE — Anesthesia Preprocedure Evaluation (Addendum)
Anesthesia Evaluation  Patient identified by MRN, date of birth, ID band Patient awake    Reviewed: Allergy & Precautions, NPO status , Patient's Chart, lab work & pertinent test results  Airway Mallampati: II  TM Distance: >3 FB Neck ROM: Full    Dental no notable dental hx.    Pulmonary neg pulmonary ROS,    Pulmonary exam normal breath sounds clear to auscultation       Cardiovascular hypertension, Pt. on medications Normal cardiovascular exam Rhythm:Regular Rate:Normal     Neuro/Psych negative neurological ROS  negative psych ROS   GI/Hepatic negative GI ROS, Neg liver ROS,   Endo/Other  negative endocrine ROS  Renal/GU negative Renal ROS     Musculoskeletal negative musculoskeletal ROS (+)   Abdominal   Peds  Hematology negative hematology ROS (+)   Anesthesia Other Findings   Reproductive/Obstetrics negative OB ROS                            Anesthesia Physical Anesthesia Plan  ASA: IV and emergent  Anesthesia Plan: General   Post-op Pain Management:    Induction: Intravenous  Airway Management Planned: Oral ETT  Additional Equipment: Arterial line  Intra-op Plan:   Post-operative Plan: Post-operative intubation/ventilation  Informed Consent: I have reviewed the patients History and Physical, chart, labs and discussed the procedure including the risks, benefits and alternatives for the proposed anesthesia with the patient or authorized representative who has indicated his/her understanding and acceptance.   Dental advisory given  Plan Discussed with: CRNA  Anesthesia Plan Comments:        Anesthesia Quick Evaluation

## 2015-01-13 NOTE — ED Notes (Addendum)
Pt belongings: black coat, blue jeans, Keys (black ford key, VIC card, MVP card, ACE card, yellow colored key, silver [employer] key chain) [employer] Card, 2 $5 bills, 3 $1 bills, 9 pennies and 1 quarter

## 2015-01-13 NOTE — Progress Notes (Signed)
Pt returned from IR and placed on previous settings.

## 2015-01-13 NOTE — Consult Note (Signed)
CC:  Chief Complaint  Patient presents with  . Altered Mental Status    HPI: Rachel Vang is a 49 y.o. female with history of HTN and smoking who was reportedly found down, possibly having seizure activity.  It's unknown how long she was down.  She was intubated for airway protection.  By report she had a dilated left pupil at presentation.  PMH: Past Medical History  Diagnosis Date  . Hypertension     PSH: Past Surgical History  Procedure Laterality Date  . Abdominal surgery      SH: Social History  Substance Use Topics  . Smoking status: None  . Smokeless tobacco: None  . Alcohol Use: None    MEDS: Prior to Admission medications   Not on File    ALLERGY: Allergies not on file  ROS: ROS  Cannot obtain because patient is intubated and unresponsive  NEUROLOGIC EXAM: Intubated Unresponsive Pupils 2mm and sluggishly reactive No motor response to painful stimulus  IMGAING: Diffuse SAH with large anterior interhemispheric collection suggestive of anterior communicating artery aneurysm.  No hydrocephalus.  IMPRESSION: - 49 y.o. female with HH5, Fisher 4 subarachnoid hemorrhage.  I suspect she will have a poor prognosis.  PLAN: - 1g Keppra IV - 10mg  Dexamethasone IV - EVD when admitted to ICU - Arteriogram, possible coil embolization by Dr. Conchita ParisNundkumar this afternoon

## 2015-01-13 NOTE — ED Notes (Signed)
Neuro surgery at bedside.

## 2015-01-13 NOTE — Procedures (Signed)
A timeout was performed. The right frontal area was clipped of hair then prepped and draped in the usual sterile fasion. The area of the planned incision and tunneled catheter was injected with lidocaine with epinephrine. An incision was made 10cm behind the glabella on the right mid pupillary line. The pericranium was scraped away. A burrhole was drilled at this location. The hole was cleaned of bone chips. The dura was sharply incised. A ventricular catheter was passed to 6.5cm at the skull and a "pop" was palpated when the ventricle was entered. The stylet was removed and there was brisk return of blood tinged CSF under pressure. The catheter was tunneled posteromedially. The incision was closed with staples and the catheter was secured at the exit site with a purse string suture. The catheter was secured to the scalp with staples. The catheter was connected to an external collection chamber. 

## 2015-01-13 NOTE — Progress Notes (Signed)
Pts history reviewed from the EMR. Pt found down outside this morning. Has history of HTN and smoking.  EXAM:  BP 116/75 mmHg  Pulse 54  Temp(Src) 99 F (37.2 C)  Resp 24  Wt 72.576 kg (160 lb)  SpO2 100%  LMP  (LMP Unknown)  Intubated, breathes over vent Pupils sluggishly reactive No motor responses  IMPRESSION:  [age] y.o. female Hunt-Hess 5, Fisher 3/4 SAH  PLAN: - Will proceed with diagnostic angiogram, possible coiling  I have reviewed the situation with the patient's family. I did briefly discuss the likely poor prognosis given her poor grade on admission. Risks of angiogram/coiling were reviewed including stroke and further hemorrhage. All questions were answered.

## 2015-01-13 NOTE — H&P (Signed)
PULMONARY / CRITICAL CARE MEDICINE   Name: Rachel MinksStephanie N Vang MRN: [id number] DOB: 11/24/1965    ADMISSION DATE:  01/13/2015   REFERRING MD:  EDP  CHIEF COMPLAINT:  AMS  HISTORY OF PRESENT ILLNESS:   49 yo AAF with HTN and non compliant with meds. 1 PD smoker and daily beer drinker. [place] [employer] and was found down with right pupil dilated and jaw clinched. EMS unable to intubate. [facility] and [facility] by EDP. Noted to have BP 245/194 and HR 165. Sedated with diprivan and fentanyl. CT head reported ICH large with suspected aneurysm. NS to evaluate. We will admit to ICU, she may ned to got to OR ? IR prior to ICU. Started on steroids and keppra per edp.   PAST MEDICAL HISTORY :  She  has no past medical history on file. HTN PAST SURGICAL HISTORY: She  has no past surgical history on file.  Allergies not on file  No current facility-administered medications on file prior to encounter.   No current outpatient prescriptions on file prior to encounter.    FAMILY HISTORY:  Her has no family status information on file.   SOCIAL HISTORY: 1 ppd smoker , daily beer drinker. REVIEW OF SYSTEMS:   NA  SUBJECTIVE:  Sedated on vent  VITAL SIGNS: BP 217/170 mmHg  Pulse 149  Resp 14  Wt 160 lb (72.576 kg)  SpO2 93%  HEMODYNAMICS:    VENTILATOR SETTINGS: Vent Mode:  [-] PRVC FiO2 (%):  [100 %] 100 % Set Rate:  [14 bmp] 14 bmp Vt Set:  [500 mL] 500 mL PEEP:  [10 cmH20] 10 cmH20 Plateau Pressure:  [24 cmH20] 24 cmH20  INTAKE / OUTPUT:    PHYSICAL EXAMINATION: General:  Sedated and with NMB. Unresponsive Neuro:  Unresponsive, rt pupil 5 mm, left 3. NMB on board HEENT: ETT in place Cardiovascular: HSR RRR ST 165 Lungs:  CTA Abdomen:Soft NT Musculoskeletal:  Intact Skin: warm and dry  LABS:  BMET No results for input(s): NA, K, CL, CO2, BUN, CREATININE, GLUCOSE in the last 168 hours.  Electrolytes No results for input(s): CALCIUM,  MG, PHOS in the last 168 hours.  CBC  Recent Labs Lab 01/13/15 1100  WBC 15.0*  HGB 14.4  HCT 44.1  PLT 246    Coag's  Recent Labs Lab 01/13/15 1100  APTT 28  INR 1.07    Sepsis Markers  Recent Labs Lab 01/13/15 1123  LATICACIDVEN 7.95*    ABG No results for input(s): PHART, PCO2ART, PO2ART in the last 168 hours.  Liver Enzymes No results for input(s): AST, ALT, ALKPHOS, BILITOT, ALBUMIN in the last 168 hours.  Cardiac Enzymes No results for input(s): TROPONINI, PROBNP in the last 168 hours.  Glucose No results for input(s): GLUCAP in the last 168 hours.  Imaging No results found.   STUDIES:  CT head see report  CULTURES:   ANTIBIOTICS:   SIGNIFICANT EVENTS: 12/15 FOUND DOWN WITH DILATED RT PUPIL  LINES/TUBES: 12/15 ott>>  DISCUSSION: 49 yo AAF with HTN and non compliant with meds. 1 PD smoker and daily beer drinker. She was walking [place] [employer] and was found down with right pupil dilated and jaw clinched. EMS unable to intubate. [facility] by EDP. Noted to have BP 245/194 and HR 165. Sedated with diprivan and fentanyl. CT head reported ICH large with suspected aneurysm. NS to evaluate. We will admit to ICU, she may ned to  got to OR ? IR prior to ICU. Started on steroids and keppra per edp.   ASSESSMENT / PLAN:  NEUROLOGIC A:   ICH, found down with left > right pupil. Suspected aneurysm  Attempted intubation in field by EMS Intubated and sedated ? seizure in field P:   RASS goal: -1 NS consult and neuro consult Admit to NSICU Keppra  Decadron  Further imaging to clarify bleed source. My need IR intervention.  PULMONARY A: VDRF secondary to acute neuro event. P:   Vent bundle  CARDIOVASCULAR A:  Known hypertension. Unknown medications P:  Lower sbp to 140-160 range for now. Neuro to see pt. Use sedation and Cardene drip  RENAL A:   Lactic acidosis. Suspect poor ventilation P:    check  creatine Recheck lactic acid and ABG  GASTROINTESTINAL A:   Gi Protection P:   PPI  HEMATOLOGIC A:   Not on anticoagulation as far as we know P:  Check coags  INFECTIOUS A:   No acute issue P:     ENDOCRINE A:   No acute issue   P:   Check labs for glucose     FAMILY  - Updates: Updated daughter at bedside  - Inter-disciplinary family meet or Palliative Care meeting due by:  day 7  Steve Minor ACNP [facility] PCCM Pager [phone] till 3 pm If no answer page [phone] 01/13/2015, 11:49 AM  Attending Note:  49 year old smoker and drinker hypertensive female that is not complying with her medications who presents to the hospital from the grocery store where she collapsed.  EMS was called but was unable to intubate patient due to jaw clinching.  Patient was brought the ED where she was paralyzed and intubated.  Head CT was done that showed a large SAH likely related to an aneurysm.  NS was called and will take up to the ICU and likely place a IVD then to IR for clipping.  Patient is unresponsive.  Cardene for BP control.  Adjust vent to ABG.  Will need to place an a-line and a central line.  Prognosis guarded.  The patient is critically ill with multiple organ systems failure and requires high complexity decision making for assessment and support, frequent evaluation and titration of therapies, application of advanced monitoring technologies and extensive interpretation of multiple databases.   Critical Care Time devoted to patient care services described in this note is  35  Minutes. This time reflects time of care of this signee Dr Koren Bound. This critical care time does not reflect procedure time, or teaching time or supervisory time of PA/NP/Med student/Med Resident etc but could involve care discussion time.  Alyson Reedy, M.D. [facility] Pulmonary/Critical Care Medicine. Pager: [phone]. After hours pager: [phone].

## 2015-01-13 NOTE — ED Notes (Signed)
Neurosurgery MD at bedside updating family on plan of care

## 2015-01-13 NOTE — Op Note (Signed)
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 Lisbeth RenshawNeelesh Alieah Brinton, MD  HISTORY:  The patient is a 49 y.o. female brought to the ED after being found down. She was comatose, and required intubation. CT demonstrated diffuse subarachnoid hemorrhage. An EVD was placed, and she was then brought for diagnostic angiogram and possible coil embolization.   APPROACH:  The technical aspects of the procedure as well as its potential risks and benefits were reviewed with the patient's family. These risks included but were not limited bleeding, infection, allergic reaction, damage to organs/vital structures, stroke, non-diagnostic procedure, and the catastrophic outcomes of heart attack, coma, and death. With an understanding of these risks, informed consent was obtained and witnessed.   The patient was placed in the supine position on the angiography table and the skin of right groin prepped in the usual sterile fashion. The procedure was performed under general anesthesia.  A 8- French sheath was introduced in the right common femoral artery using Seldinger technique. A fluorophase sequence was used to document the sheath position.   HEPARIN: 0 Units total.  CONTRAST AGENT: 100cc, Omnipaque 300  FLUOROSCOPY TIME: 31.6 combined AP and lateral minutes   CATHETER(S) AND WIRE(S):  5-French JB-1 glidecatheter 0.035" glidewire   90 cm 6 [place] NeuronMax guide sheath 120 cm 6 [place] Berenstein select guide catheter 115 cm 058 catalyst 5 guide catheter 150 cm XT-27 microcatheter XT-17 microcatheter Synchro 2 standard microwire  VESSELS CATHETERIZED:  Right common carotid   Right internal carotid Left common carotid Left internal carotid   Right vertebral   Left vertebral   Right common femoral  Left anterior cerebral artery, A3 segment  VESSELS STUDIED:  Right common carotid, neck Right internal carotid, head Right  vertebral Left common carotid, neck Left internal carotid, head Left internal carotid, 3D rotational angiogram Left vertebral Right femoral  Left internal carotid, pre-embolization Left internal carotid, during embolization Left internal carotid, immediate post embolization Left internal carotid, final control  COILS DEPLOYED (MRI COMPATIBLE): Target 360 Soft 5mm x 10cm Target 360 Ultra 4mm x 10cm Target 360 Ultra 3.685mm x 8cm Target 360 Nano 2mm x 4cm  PROCEDURAL NARRATIVE:  A 5-Fr JB-1 terumo glide catheter was advanced over a 0.035 glidewire into the aortic arch. The above vessels were then sequentially catheterized and cervical/cerebral angiograms taken. After review of images, the catheter was removed without incident.    The guide sheath was then introduced over the select guide catheter and 035 wire. The left common artery was then selected, and the guide sheath was advanced into the left common. Under roadmap guidance, the left internal was selected, and the guide sheath was advanced into the proximal left internal carotid. The select catheter was then removed without incident. The guide catheter was then introduced over the XT-27 microcatheter and Synchro standard microwire. The microcatheter was used to select the cavernous internal carotid artery and subsequently the M3 segment middle cerebral artery. The catalyst guide catheter was then advanced to its final position in the supraclinoid ICA. The XT-27 microcatheter was then removed, and the XT 17 microcatheter was introduced. The aneurysm was then catheterized. The above coils were then sequentially deployed, with angiogram taken to confirm good position. Immediate postembolization angiogram was then taken. The microcatheter was then removed without incident. The guide catheter and sheath were then withdrawn into the distal cervical internal carotid artery, and final control angiogram was taken. The entire catheter construct was then  removed synchronously  without incident.  INTERPRETATION:  Right femoral:  Normal vessel. No significant atherosclerotic disease. Arterial sheath in adequate position.   Right common carotid: neck:   There is no significant stenosis, occlusion, aneurysm or plaque visualized on this injection.    Right internal carotid: head:   Injection reveals the presence of a widely patent ICA, M1, and a small A1 segment. 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 The parenchymal and venous phases are normal. The venous sinuses are widely patent.    Left common carotid: neck:   There is no significant stenosis, occlusion, aneurysm or plaque visualized on this injection.    Left internal carotid: head:   Injection reveals the presence of a widely patent ICA, A1, and M1 segments and their branches. The A2 is noted to be azygous, and there is an aneurysm arising from the proximal left callosomarginal artery. This aneurysm is better delineated on the 3D rotational angiogram, but measures 5.5 x 3.8 mm. The parenchymal and venous phases are normal. The venous sinuses are widely patent.    Left internal carotid, 3D rotation: 3-dimensional rotational angiographic images were reconstructed on an independent workstation for review.  These images again demonstrate the approximately 5-1/2 mm aneurysm which arises from the left callosal marginal artery, at its bifurcation point.  Right vertebral:   Injection reveals the presence of a widely patent vertebral artery. This leads to a widely patent basilar artery that terminates in bilateral P1. The basilar apex is normal. There is no significant stenosis, occlusion, aneurysm, or vascular malformation visualized. The parenchymal and venous phases are normal. The venous sinuses are widely patent.    Left vertebral:    Normal vessel. No PICA aneurysm. See basilar description above.    Left internal carotid, pre-embolization: Again  demonstrated is the azygous A2, with the callosomarginal artery as described above.  Left internal carotid, during embolization: The callosal marginal artery is seen to be progressively excluded by placement of the coils.  There is no corneal prolapse seen.  The callosomarginal branches are widely patent, as is the distal anterior cerebral artery.  Left internal carotid, immediate postembolization: No masses stable within the aneurysm, without any identified aneurysm filling.  All anterior cerebral artery branches are patent.  No filling defects are seen.  Left internal carotid, final control: The left internal carotid artery, anterior cerebral, and middle cerebral arteries are widely patent.  Well mass is stable. No aneurysm filling is seen.  Capillary phase demonstrates normal perfusion in all territories.  Venous phase is unremarkable.  DISPOSITION:  Upon completion of the study, the femoral sheath was removed and hemostasis obtained using a 7-Fr ExoSeal closure device. Good proximal and distal lower extremity pulses were documented upon achievement of hemostasis.   The procedure was well tolerated and no early complications were observed.    The patient was extubated and taken to the postanesthesia care unit in stable hemodynamic condition for further care.  IMPRESSION:  1. 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  No aneurysm filling is seen post embolization.  The preliminary results of this procedure were shared with the patient and the patient's family.

## 2015-01-13 NOTE — Procedures (Signed)
Arterial Catheter Insertion Procedure Note Rachel Vang [id number] 01/01/1966  Procedure: Insertion of Arterial Catheter  Indications: Blood pressure monitoring  Procedure Details Consent: Risks of procedure as well as the alternatives and risks of each were explained to the (patient/caregiver).  Consent for procedure obtained. Time Out: Verified patient identification, verified procedure, site/side was marked, verified correct patient position, special equipment/implants available, medications/allergies/relevent history reviewed, required imaging and test results available.  Performed  Maximum sterile technique was used including antiseptics, cap, gloves, gown, hand hygiene, mask and sheet. Skin prep: Chlorhexidine; local anesthetic administered 20 gauge catheter was inserted into right radial artery using the Seldinger technique.  Evaluation Blood flow good; BP tracing good. Complications: No apparent complications.   Rachel Vang, Rachel Vang 01/13/2015

## 2015-01-13 NOTE — Anesthesia Postprocedure Evaluation (Signed)
Anesthesia Post Note  Patient: Rachel MinksStephanie N Vang  Procedure(s) Performed: Procedure(s) (LRB): RADIOLOGY WITH ANESTHESIA (N/A)  Patient location during evaluation: ICU Anesthesia Type: General Level of consciousness: awake and alert Pain management: pain level controlled Vital Signs Assessment: post-procedure vital signs reviewed and stable Respiratory status: respiratory function stable, patient remains intubated per anesthesia plan and patient on ventilator - see flowsheet for VS Cardiovascular status: blood pressure returned to baseline and stable Postop Assessment: no signs of nausea or vomiting Anesthetic complications: no    Last Vitals:  Filed Vitals:   01/13/15 2045 01/13/15 2100  BP: 109/81 99/71  Pulse: 102 85  Temp: 37 C 37.1 C  Resp: 25 24    Last Pain: There were no vitals filed for this visit.               Shaletha Humble A

## 2015-01-14 ENCOUNTER — Inpatient Hospital Stay (HOSPITAL_COMMUNITY): Payer: Medicaid Other

## 2015-01-14 ENCOUNTER — Encounter (HOSPITAL_COMMUNITY): Payer: Self-pay | Admitting: Neurosurgery

## 2015-01-14 DIAGNOSIS — I609 Nontraumatic subarachnoid hemorrhage, unspecified: Secondary | ICD-10-CM

## 2015-01-14 DIAGNOSIS — Z4659 Encounter for fitting and adjustment of other gastrointestinal appliance and device: Secondary | ICD-10-CM | POA: Insufficient documentation

## 2015-01-14 DIAGNOSIS — J9601 Acute respiratory failure with hypoxia: Secondary | ICD-10-CM | POA: Insufficient documentation

## 2015-01-14 DIAGNOSIS — J9602 Acute respiratory failure with hypercapnia: Secondary | ICD-10-CM

## 2015-01-14 DIAGNOSIS — I6789 Other cerebrovascular disease: Secondary | ICD-10-CM

## 2015-01-14 LAB — BASIC METABOLIC PANEL
Anion gap: 11 (ref 5–15)
BUN: 10 mg/dL (ref 6–20)
CALCIUM: 8.5 mg/dL — AB (ref 8.9–10.3)
CO2: 20 mmol/L — AB (ref 22–32)
CREATININE: 1.01 mg/dL — AB (ref 0.44–1.00)
Chloride: 111 mmol/L (ref 101–111)
GFR calc non Af Amer: >60 mL/min (ref >60)
GLUCOSE: 172 mg/dL — AB (ref 65–99)
Potassium: 3.5 mmol/L (ref 3.5–5.1)
Sodium: 142 mmol/L (ref 135–145)

## 2015-01-14 LAB — TRIGLYCERIDES: Triglycerides: 61 mg/dL (ref <150)

## 2015-01-14 LAB — GLUCOSE, CAPILLARY
GLUCOSE-CAPILLARY: 161 mg/dL — AB (ref 65–99)
Glucose-Capillary: 140 mg/dL — ABNORMAL HIGH (ref 65–99)
Glucose-Capillary: 139 mg/dL — ABNORMAL HIGH (ref 65–99)

## 2015-01-14 LAB — CBC
HEMATOCRIT: 34.7 % — AB (ref 36.0–46.0)
Hemoglobin: 11.7 g/dL — ABNORMAL LOW (ref 12.0–15.0)
MCH: 31.4 pg (ref 26.0–34.0)
MCHC: 33.7 g/dL (ref 30.0–36.0)
MCV: 93 fL (ref 78.0–100.0)
Platelets: 135 10*3/uL — ABNORMAL LOW (ref 150–400)
RBC: 3.73 MIL/uL — ABNORMAL LOW (ref 3.87–5.11)
RDW: 14.4 % (ref 11.5–15.5)
WBC: 16 10*3/uL — ABNORMAL HIGH (ref 4.0–10.5)

## 2015-01-14 LAB — TROPONIN I: TROPONIN I: 2.19 ng/mL — AB (ref <0.031)

## 2015-01-14 LAB — PATHOLOGIST SMEAR REVIEW: PATH REVIEW: NORMAL

## 2015-01-14 MED ORDER — VITAL HIGH PROTEIN PO LIQD
1000.0000 mL | ORAL | Status: DC
  Administered 2015-01-14 – 2015-01-19 (×6): 1000 mL
  Filled 2015-01-14 (×9): qty 1000

## 2015-01-14 MED ORDER — NIMODIPINE 60 MG/20ML PO SOLN
60.0000 mg | ORAL | Status: DC
  Administered 2015-01-14 – 2015-02-08 (×149): 60 mg via ORAL
  Filled 2015-01-14 (×156): qty 20

## 2015-01-14 MED ORDER — NICARDIPINE HCL IN NACL 40-0.83 MG/200ML-% IV SOLN
3.0000 mg/h | INTRAVENOUS | Status: DC
  Administered 2015-01-14: 14 mg/h via INTRAVENOUS
  Administered 2015-01-14 (×2): 8 mg/h via INTRAVENOUS
  Administered 2015-01-14: 12.5 mg/h via INTRAVENOUS
  Administered 2015-01-14: 8 mg/h via INTRAVENOUS
  Administered 2015-01-14: 8.5 mg/h via INTRAVENOUS
  Administered 2015-01-15 (×2): 8 mg/h via INTRAVENOUS
  Administered 2015-01-15: 9 mg/h via INTRAVENOUS
  Administered 2015-01-15: 8 mg/h via INTRAVENOUS
  Administered 2015-01-16: 5 mg/h via INTRAVENOUS
  Administered 2015-01-16: 8 mg/h via INTRAVENOUS
  Administered 2015-01-16: 10 mg/h via INTRAVENOUS
  Administered 2015-01-16: 6 mg/h via INTRAVENOUS
  Administered 2015-01-16: 8 mg/h via INTRAVENOUS
  Administered 2015-01-17 (×2): 7.5 mg/h via INTRAVENOUS
  Filled 2015-01-14 (×19): qty 200

## 2015-01-14 MED ORDER — FENTANYL CITRATE (PF) 100 MCG/2ML IJ SOLN
25.0000 ug | INTRAMUSCULAR | Status: DC | PRN
  Administered 2015-01-14 – 2015-01-15 (×7): 25 ug via INTRAVENOUS
  Filled 2015-01-14 (×8): qty 2

## 2015-01-14 MED ORDER — VITAL HIGH PROTEIN PO LIQD
1000.0000 mL | ORAL | Status: DC
  Filled 2015-01-14 (×3): qty 1000

## 2015-01-14 MED ORDER — INSULIN ASPART 100 UNIT/ML ~~LOC~~ SOLN
0.0000 [IU] | SUBCUTANEOUS | Status: DC
  Administered 2015-01-14 (×3): 1 [IU] via SUBCUTANEOUS
  Administered 2015-01-14: 2 [IU] via SUBCUTANEOUS
  Administered 2015-01-15 (×2): 1 [IU] via SUBCUTANEOUS
  Administered 2015-01-15: 2 [IU] via SUBCUTANEOUS
  Administered 2015-01-15 (×2): 1 [IU] via SUBCUTANEOUS
  Administered 2015-01-15 – 2015-01-16 (×7): 2 [IU] via SUBCUTANEOUS
  Administered 2015-01-17: 4 [IU] via SUBCUTANEOUS
  Administered 2015-01-17: 3 [IU] via SUBCUTANEOUS
  Administered 2015-01-17 – 2015-01-18 (×4): 2 [IU] via SUBCUTANEOUS
  Administered 2015-01-18: 1 [IU] via SUBCUTANEOUS
  Administered 2015-01-18: 2 [IU] via SUBCUTANEOUS
  Administered 2015-01-18: 1 [IU] via SUBCUTANEOUS
  Administered 2015-01-18 – 2015-01-19 (×6): 2 [IU] via SUBCUTANEOUS
  Administered 2015-01-19: 1 [IU] via SUBCUTANEOUS
  Administered 2015-01-19 (×2): 2 [IU] via SUBCUTANEOUS
  Administered 2015-01-20: 3 [IU] via SUBCUTANEOUS
  Administered 2015-01-20: 2 [IU] via SUBCUTANEOUS
  Administered 2015-01-20: 5 [IU] via SUBCUTANEOUS
  Administered 2015-01-20: 3 [IU] via SUBCUTANEOUS
  Administered 2015-01-20: 2 [IU] via SUBCUTANEOUS
  Administered 2015-01-21 (×2): 3 [IU] via SUBCUTANEOUS
  Administered 2015-01-21 (×2): 5 [IU] via SUBCUTANEOUS
  Administered 2015-01-21: 3 [IU] via SUBCUTANEOUS
  Administered 2015-01-21 (×2): 5 [IU] via SUBCUTANEOUS
  Administered 2015-01-22: 2 [IU] via SUBCUTANEOUS
  Administered 2015-01-22: 5 [IU] via SUBCUTANEOUS

## 2015-01-14 MED ORDER — NIMODIPINE 30 MG PO CAPS: 60.0000 mg | ORAL_CAPSULE | ORAL | Status: DC

## 2015-01-14 MED ORDER — NICARDIPINE HCL IN NACL 20-0.86 MG/200ML-% IV SOLN: 3.0000 mg/h | INTRAVENOUS | Status: DC

## 2015-01-14 NOTE — Progress Notes (Signed)
Pt seen and examined. No issues overnight. Remains intubated, on propofol  EXAM: Temp:  [97.9 F (36.6 C)-100.2 F (37.9 C)] 99.5 F (37.5 C) (12/16 1230) Pulse Rate:  [54-133] 91 (12/16 1230) Resp:  [14-28] 24 (12/16 1230) BP: (85-179)/(58-91) 104/63 mmHg (12/16 1230) SpO2:  [98 %-100 %] 98 % (12/16 1230) Arterial Line BP: (82-176)/(53-102) 110/53 mmHg (12/16 1230) FiO2 (%):  [40 %-100 %] 40 % (12/16 1214) Weight:  [73.4 kg (161 lb 13.1 oz)] 73.4 kg (161 lb 13.1 oz) (12/16 0400) Intake/Output      12/15 0701 - 12/16 0700 12/16 0701 - 12/17 0700   I.V. (mL/kg) 3715.8 (50.6) 1196 (16.3)   Other 1000    IV Piggyback 350 50   Total Intake(mL/kg) 5065.8 (69) 1246 (17)   Urine (mL/kg/hr) 3675 1850 (3.6)   Emesis/NG output 400    Drains 38 11 (0)   Total Output 4113 1861   Net +952.8 -615         On low-dose propofol: Opens eyes Pupils sluggish Breathes over vent Withdraws all extremities briskly, Not FC EVD in place, ICP 5-9010mmHg  LABS: Lab Results  Component Value Date   CREATININE 1.01* 01/14/2015   BUN 10 01/14/2015   NA 142 01/14/2015   K 3.5 01/14/2015   CL 111 01/14/2015   CO2 20* 01/14/2015   Lab Results  Component Value Date   WBC 16.0* 01/14/2015   HGB 11.7* 01/14/2015   HCT 34.7* 01/14/2015   MCV 93.0 01/14/2015   PLT 135* 01/14/2015    IMPRESSION: - 49 y.o. female SAH d# 2 s/p coiling distal ACA aneurysm, slightly improved from admission  PLAN: - Cont to wean propofol - Vent mgmt per PCCM - TCD, cont nimotop - Will d/c mannitol/albumin, liberalize systolic BP goal to 160mmHg or less for today

## 2015-01-14 NOTE — Procedures (Signed)
ELECTROENCEPHALOGRAM REPORT  Date of Study: 01/14/2015  Patient's Name: Rachel Vang MRN: [id number] Date of Birth: 08-12-1965  Referring Provider: Dr. Rory Percyaniel Feinstein  Clinical History: This is a 49 year old woman with unresponsiveness, found to have a subarachnoid bleed.   Medications: propofol (DIPRIVAN) 1000 MG/100ML infusion fentaNYL (SUBLIMAZE) injection 25 mcg levETIRAcetam (KEPPRA) 500 mg in sodium chloride 0.9 % 100 mL IVPB ceFAZolin (ANCEF) IVPB 1 g/50 mL premix dexamethasone (DECADRON) injection 4 mg nicardipine (CARDENE) 40mg  in 0.83% saline 200ml IV DOUBLE STRENGTH infusion (0.2 mg/ml) NiMODipine (NYMALIZE) 60 MG/20ML oral solution 60 mg pantoprazole (PROTONIX) injection 40 mg  Technical Summary: A multichannel digital EEG recording measured by the international 10-20 system with electrodes applied with paste and impedances below 5000 ohms performed as portable with EKG monitoring in an intubated and sedated patient.  Hyperventilation and photic stimulation were not performed.  The digital EEG was referentially recorded, reformatted, and digitally filtered in a variety of bipolar and referential montages for optimal display.   Description: The patient is intubated and sedated on Proprofol and Fentanyl during the recording. There is no clear posterior dominant rhythm. The background consists of a large amount of diffuse 4-5 Hz theta and 2-3 Hz delta slowing, with additional focal slowing over the left temporal region. Normal sleep architecture is not seen. There is minimal reactivity to noxious stimulation. Hyperventilation and photic stimulation were not performed.  There were no epileptiform discharges or electrographic seizures seen.    EKG lead was unremarkable.  Impression: This sedated EEG is abnormal due to moderate diffuse slowing of the background with additional focal slowing over the left temporal region.  Clinical Correlation of the above findings  indicates bilateral cerebral dysfunction that is non-specific in etiology and can be seen with hypoxic/ischemic injury, toxic/metabolic encephalopathies, or medication effect. Additional focal slowing over the left temporal region indicates focal cerebral dysfunction in this region suggestive of underlying structural or physiologic abnormality. There were no electrographic seizures seen in this study.    [name], M.D.

## 2015-01-14 NOTE — Progress Notes (Signed)
PULMONARY / CRITICAL CARE MEDICINE   Name: Rachel Vang MRN: [id number] DOB: [birth year]    ADMISSION DATE:  01/13/2015   REFERRING MD:  EDP  CHIEF COMPLAINT:  AMS  HISTORY OF PRESENT ILLNESS:   49 yo AAF with HTN and non compliant with meds. 1 PD smoker and daily beer drinker. She was walking to [employer] and was found down with right pupil dilated and jaw clinched. EMS unable to intubate. Transported  to [facility] and intubated by EDP. Noted to have BP 245/194 and HR 165. Sedated with diprivan and fentanyl. CT head reported ICH large with suspected aneurysm. NS to evaluate. We will admit to ICU, she may ned to got to OR ? IR prior to ICU. Started on steroids and keppra per edp.   SUBJECTIVE:  Sedated on vent. BPs decreased significantly from admission, currently in 90s systolic.  VITAL SIGNS: BP 85/79 mmHg  Pulse 112  Temp(Src) 99.7 F (37.6 C) (Core (Comment))  Resp 21  Wt 161 lb 13.1 oz (73.4 kg)  SpO2 99%  LMP  (LMP Unknown)  HEMODYNAMICS: CVP:  [4 mmHg-17 mmHg] 8 mmHg  VENTILATOR SETTINGS: Vent Mode:  [-] PRVC FiO2 (%):  [40 %-100 %] 40 % Set Rate:  [14 bmp-24 bmp] 24 bmp Vt Set:  [500 mL] 500 mL PEEP:  [5 cmH20-10 cmH20] 5 cmH20 Plateau Pressure:  [23 cmH20-31 cmH20] 31 cmH20  INTAKE / OUTPUT: I/O last 3 completed shifts: In: 5065.8 [I.V.:3715.8; Other:1000; IV Piggyback:350] Out: 4098 [JXBJY:7829; Emesis/NG output:400; Drains:38]  PHYSICAL EXAMINATION: General: middle aged woman sedated on vent Neuro:  Unresponsive. Pupils pinpoint bilaterally.  HEENT: Right frontal head w/ hair removed, ventriculostomy in place, staples present. ETT in place Cardiovascular: tachycardic in 110s, no m/g/r Lungs: coarse breath sounds bilaterally Abdomen: BS hypoactive, soft, obese Musculoskeletal:  No peripheral edema  Skin: warm and dry  LABS:  BMET  Recent Labs Lab 01/13/15 1100 01/13/15 1826 01/14/15 0600  NA 138 140 142  K 3.1* 4.1 3.5  CL 102  --  111   CO2 20*  --  20*  BUN 13  --  10  CREATININE 1.11*  --  1.01*  GLUCOSE 262*  --  172*    Electrolytes  Recent Labs Lab 01/13/15 1100 01/13/15 1328 01/14/15 0600  CALCIUM 9.5  --  8.5*  MG  --  1.7  --   PHOS  --  2.3*  --     CBC  Recent Labs Lab 01/13/15 1100 01/13/15 1328 01/13/15 1826 01/14/15 0600  WBC 15.0* 25.8*  --  16.0*  HGB 14.4 14.1 12.6 11.7*  HCT 44.1 43.2 37.0 34.7*  PLT 246 214  --  135*    Coag's  Recent Labs Lab 01/13/15 1100  APTT 28  INR 1.07    Sepsis Markers  Recent Labs Lab 01/13/15 1123 01/13/15 1500  LATICACIDVEN 7.95* 5.5*    ABG  Recent Labs Lab 01/13/15 1204 01/13/15 1826  PHART 7.152* 7.367  PCO2ART 59.8* 34.9*  PO2ART 82.0 122.0*    Liver Enzymes  Recent Labs Lab 01/13/15 1100  AST 33  ALT 26  ALKPHOS 104  BILITOT 0.6  ALBUMIN 4.0    Cardiac Enzymes  Recent Labs Lab 01/13/15 1328 01/13/15 2010 01/14/15 0030  TROPONINI 1.51* 2.29* 2.19*    Glucose No results for input(s): GLUCAP in the last 168 hours.  Imaging Ct Head Wo Contrast  01/13/2015  CLINICAL DATA:  Found down outside by neighbors after going to  the grocery store, elevated blood pressure at presentation EXAM: CT HEAD WITHOUT CONTRAST CT CERVICAL SPINE WITHOUT CONTRAST TECHNIQUE: Multidetector CT imaging of the head and cervical spine was performed following the standard protocol without intravenous contrast. Multiplanar CT image reconstructions of the cervical spine were also generated. COMPARISON:  None FINDINGS: CT HEAD FINDINGS Normal ventricular morphology. Extensive high attenuation subarachnoid hemorrhage identified throughout sulci, sylvian fissures, basilar cisterns, and interhemispheric fissure particularly anteriorly. Findings are highly suspicious for ruptured intracranial aneurysm. No intraparenchymal hemorrhage, mass lesion or evidence acute infarction identified. Suspect minimal blood within fourth ventricle. Visualized  paranasal sinuses and mastoid air cells clear. Calvarial thickening superolateral LEFT orbit, of uncertain chronicity and etiology. No other osseous abnormalities. CT CERVICAL SPINE FINDINGS Endotracheal tube present anterior to cervical spine. Prevertebral soft tissues normal thickness. Vertebral body heights maintained. Minimal disc space narrowing and endplate spur formation C5-C6. No fracture, subluxation or bone destruction. Visualized skullbase intact. Interseptal thickening at lung apices. Subarachnoid hemorrhage extends into the upper cervical spinal canal. IMPRESSION: Extensive high attenuation acute subarachnoid hemorrhage throughout basilar cisterns, sylvian fissures, interhemispheric fissure, and BILATERAL cortical sulci high suspicious for ruptured intracranial aneurysm. No acute intraparenchymal brain abnormalities. Minimal degenerative disc disease changes cervical spine. No acute cervical spine abnormalities. Critical Value/emergent results were called by telephone at the time of interpretation on 01/13/2015 at 1131 hr to Dr. Frederick Peers , who verbally acknowledged these results. Electronically Signed   By: Ulyses Southward M.D.   On: 01/13/2015 11:50   Ct Cervical Spine Wo Contrast  01/13/2015  CLINICAL DATA:  Found down outside by neighbors after going to the grocery store, elevated blood pressure at presentation EXAM: CT HEAD WITHOUT CONTRAST CT CERVICAL SPINE WITHOUT CONTRAST TECHNIQUE: Multidetector CT imaging of the head and cervical spine was performed following the standard protocol without intravenous contrast. Multiplanar CT image reconstructions of the cervical spine were also generated. COMPARISON:  None FINDINGS: CT HEAD FINDINGS Normal ventricular morphology. Extensive high attenuation subarachnoid hemorrhage identified throughout sulci, sylvian fissures, basilar cisterns, and interhemispheric fissure particularly anteriorly. Findings are highly suspicious for ruptured intracranial  aneurysm. No intraparenchymal hemorrhage, mass lesion or evidence acute infarction identified. Suspect minimal blood within fourth ventricle. Visualized paranasal sinuses and mastoid air cells clear. Calvarial thickening superolateral LEFT orbit, of uncertain chronicity and etiology. No other osseous abnormalities. CT CERVICAL SPINE FINDINGS Endotracheal tube present anterior to cervical spine. Prevertebral soft tissues normal thickness. Vertebral body heights maintained. Minimal disc space narrowing and endplate spur formation C5-C6. No fracture, subluxation or bone destruction. Visualized skullbase intact. Interseptal thickening at lung apices. Subarachnoid hemorrhage extends into the upper cervical spinal canal. IMPRESSION: Extensive high attenuation acute subarachnoid hemorrhage throughout basilar cisterns, sylvian fissures, interhemispheric fissure, and BILATERAL cortical sulci high suspicious for ruptured intracranial aneurysm. No acute intraparenchymal brain abnormalities. Minimal degenerative disc disease changes cervical spine. No acute cervical spine abnormalities. Critical Value/emergent results were called by telephone at the time of interpretation on 01/13/2015 at 1131 hr to Dr. Frederick Peers , who verbally acknowledged these results. Electronically Signed   By: Ulyses Southward M.D.   On: 01/13/2015 11:50   Dg Chest Port 1 View  01/14/2015  CLINICAL DATA:  Respiratory failure. EXAM: PORTABLE CHEST 1 VIEW COMPARISON:  01/13/2015. FINDINGS: Endotracheal tube tip is just above the orifice of the right mainstem bronchus. Proximal retraction of approximately 2 3 cm suggested. Left IJ line and NG tube in stable position. Stable cardiomegaly. Mild bilateral interstitial prominence. Mild congestive heart failure cannot be excluded.  Low lung volumes. No pneumothorax. IMPRESSION: 1. Endotracheal tube tip is at the orifice of the right mainstem bronchus. Proximal repositioning of 2 to 3 cm suggested. Remaining  lines and tubes in stable position. 2. Cardiomegaly with mild bilateral from interstitial prominence. A mild component of congestive heart failure cannot be excluded. 3. Low lung volumes . Critical Value/emergent results were called by telephone at the time of interpretation on 01/14/2015 at 7:45 am to nurse Suzanna, who verbally acknowledged these results. Electronically Signed   By: Maisie Fus  Register   On: 01/14/2015 07:47   Dg Chest Portable 1 View  01/13/2015  CLINICAL DATA:  Central catheter placement EXAM: PORTABLE CHEST 1 VIEW COMPARISON:  Study obtained earlier in the day FINDINGS: Central catheter tip is in the superior vena cava. Nasogastric tube tip and side port are below the diaphragm. Endotracheal tube tip is 2.3 cm above the carina. No pneumothorax. There is mild bibasilar atelectasis. Lungs elsewhere clear. Heart is mildly enlarged with pulmonary vascularity within normal limits. No adenopathy. IMPRESSION: Tube and catheter positions as described without pneumothorax. Mild bibasilar atelectasis. Lungs elsewhere clear. No change in cardiac silhouette. Electronically Signed   By: Bretta Bang III M.D.   On: 01/13/2015 13:36   Dg Chest Portable 1 View  01/13/2015  CLINICAL DATA:  Check endotracheal tube placement EXAM: PORTABLE CHEST - 1 VIEW COMPARISON:  08/30/2005 FINDINGS: Cardiac shadow remains enlarged. An endotracheal tube is noted 4 cm above the carina. The lungs are well aerated bilaterally with mild peribronchial cuffing bilaterally which may represent bronchitis. No focal confluent infiltrate is seen. IMPRESSION: Endotracheal tube in satisfactory position. Mild central peribronchial cuffing Electronically Signed   By: Alcide Clever M.D.   On: 01/13/2015 12:02   Dg Abd Portable 1v  01/13/2015  CLINICAL DATA:  OG tube placement. EXAM: PORTABLE ABDOMEN - 1 VIEW COMPARISON:  08/30/2005 FINDINGS: Enteric tube is present with tip in the right mid abdomen consistent with location in  the second-third portion of the duodenum. Evaluation of the abdomen is limited due to motion artifact but there is no gross evidence of any gaseous distention of bowel. IMPRESSION: Enteric tube tip is in the right mid abdomen consistent with location in the second-third portion of the duodenum. Electronically Signed   By: Burman Nieves M.D.   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 seizure in field P:   RASS goal: -1 Decadron 4 mg Q6H, consider dc Keppra 500 mg Q12H Mannitol 25 g Q6H, consider dc Allow pos balance Propofol, may need top dc as drop noted, consider MAP 75-105 Neurosurg and Neuro following Allow pos balance TCD at day 3 post coil then qod consider albumin dc  PULMONARY A: VDRF secondary to acute SAH P:   Vent support Keep same MV, avoid acidosis Need ABG this AM Need pCXR in am   CARDIOVASCULAR A:  Known hypertension. Unknown medications Elevated troponin - r/o catacholamines induced P:  Tele Nicardipine gtt, see neuro Echo No asa  RENAL A:   Lactic acidosis. Suspect poor ventilation Metabolic acidosis HHH at day  3 post coil At risk CSW / SIADH P:   Chem in AM Some increase volume out already - mannitol Consider dc all MAnnitol Consider dc albumin Avoid any free water Saline cvp assessments, no high goals now  GASTROINTESTINAL A:   Gi Protection P:   PPI Start feeds  HEMATOLOGIC A:   Not on anticoagulation as far as we know Leukocytosis- likely reactive P:  PT/INR  normal scd  INFECTIOUS A:   IVC risk at day 5 incfection P:   Afebrile Leukocytosis likely reactive Consider dc ancef  ENDOCRINE A:   Hyperglycemia P:   Add ISS  FAMILY  - Updates: Updated daughter at bedside  - Inter-disciplinary family meet or Palliative Care meeting due by:  day 7  Rich Numberarly Rivet, MD, MPH Internal Medicine Resident, PGY-II Pager: [phone]  STAFF NOTE: Cindi CarbonI, Daniel Feinstein, MD FACP have personally reviewed patient's available data, including medical history, events of note, physical examination and test results as part of my evaluation. I have discussed with resident/NP and other care providers such as pharmacist, RN and RRT. In addition, I personally evaluated patient and elicited key findings of: neuro exam encouraging, CT head impressive, ivc noted, vasospasm risk at day 3, allow pos balance now, avoid fevers, no free water, TCD day 3, nimodipine consideration as HTN noted anyway, consider goals MAP now 75-105, we are in that range now, seizure treatment keppra maintain, eeg consideration, consider reduce or dc steroids, mannitol, albumin, follow urine output closely once off mannitol, ABg reviewed, keep same MV, avoid any acidosis, may be able to sbt today, no extubation, consider dc ancef, risk infection IVC starts day 5, statr feeds, scd The patient is critically ill with multiple organ systems failure and requires high complexity decision making for assessment and support, frequent evaluation and titration of therapies, application of advanced monitoring technologies and extensive interpretation of multiple databases.   Critical Care Time devoted to patient care services described in this note is 30 Minutes. This time reflects time of care of this signee: Rory Percyaniel Feinstein, MD FACP. This critical care time does not reflect procedure time, or teaching time or supervisory time of PA/NP/Med student/Med Resident etc but could involve care discussion time. Rest per  NP/medical resident whose note is outlined above and that I agree with   Mcarthur Rossettianiel J. Tyson AliasFeinstein, MD, FACP Pgr: [phone] [facility] 01/14/2015 9:40 AM

## 2015-01-14 NOTE — Progress Notes (Signed)
Initial Nutrition Assessment  INTERVENTION:   Initiate Vital High Protein @ 20 ml/hr via OG tube and increase by 10 ml every 4 hours to goal rate of 50 ml/hr.   Tube feeding regimen provides 1200 kcal, 105 grams of protein, and 1003 ml of H2O.  TF regimen and propofol at current rate providing 1775 total kcal/day (99 % of kcal needs)  NUTRITION DIAGNOSIS:   Inadequate oral intake related to inability to eat as evidenced by NPO status.  GOAL:   Patient will meet greater than or equal to 90% of their needs  MONITOR:   TF tolerance, Weight trends, Vent status, Labs  REASON FOR ASSESSMENT:   Consult Enteral/tube feeding initiation and management  ASSESSMENT:   Pt with hx of HTN, non-compliant with medications and daily drinker admitted after being found down with extensive subarachnoid hemorrhage, likely ruptured aneurysm, coil done, drain IVC placed.    Patient is currently intubated on ventilator support MV: 12.4 L/min Temp (24hrs), Avg:99.2 F (37.3 C), Min:97.9 F (36.6 C), Max:100.2 F (37.9 C)  Propofol: 21.8 ml/hr provides: 575 kcal per day from lipid Pt being prepped for EEG currently therefore no nutrition-focused physical exam completed.  Medications reviewed and include: decadron No family at bedside.   Diet Order:  Diet NPO time specified  Skin:  Reviewed, no issues  Last BM:  unknown  Height:   Ht Readings from Last 1 Encounters:  No data found for Ht  Estimated to be 64"  Weight:   Wt Readings from Last 1 Encounters:  01/14/15 161 lb 13.1 oz (73.4 kg)   Ideal Body Weight:     BMI:  There is no height on file to calculate BMI.  Estimated Nutritional Needs:   Kcal:  1785  Protein:  90-110 grams  Fluid:  > 1.8 L/day  EDUCATION NEEDS:   No education needs identified at this time  Kendell BaneHeather Caleigh Rabelo RD, LDN, CNSC [phone] Pager [phone] After Hours Pager

## 2015-01-14 NOTE — Progress Notes (Signed)
   01/14/15 0958  Airway 7.5 mm  Placement Date/Time: 01/13/15 1100   Grade View: Grade 1  Placed By: ED Physician  Laryngoscope Blade: MAC;3  ETT Types: Subglottic;Oral  Size (mm): 7.5 mm  Cuffed: Cuffed  Insertion attempts: 1  Airway Equipment: Stylet  Placement Confirmation: ETCO2...  Secured at (cm) 24 cm (tube was at 26.  Moved to 24.)  Pulled back ett per md request from 26 at the lip to 24.  Patient is showing good exhaled volumes.  RN is aware.

## 2015-01-14 NOTE — Progress Notes (Signed)
EEG completed, results pending. 

## 2015-01-14 NOTE — Progress Notes (Signed)
  Echocardiogram 2D Echocardiogram has been performed.  Janalyn HarderWest, Melissa Tomaselli R 01/14/2015, 2:53 PM

## 2015-01-14 NOTE — Progress Notes (Signed)
Inpatient Diabetes Program Recommendations  AACE/ADA: New Consensus Statement on Inpatient Glycemic Control (2015)  Target Ranges:  Prepandial:   less than 140 mg/dL      Peak postprandial:   less than 180 mg/dL (1-2 hours)      Critically ill patients:  140 - 180 mg/dL  Results for Lucienne MinksHAGLER, Roan N (MRN [id number]) as of 01/14/2015 08:44  Ref. Range 01/13/2015 11:00 01/14/2015 06:00  Glucose Latest Ref Range: 65-99 mg/dL 409262 (H) 811172 (H)   Review of Glycemic Control   Outpatient Diabetes medications: NA Current orders for Inpatient glycemic control: None  Inpatient Diabetes Program Recommendations: Correction (SSI): While inpatient and ordered steroids, please consider ordering CBGs with Novolog correction Q4H; recommend using the ICU Glycemic Control order set. HgbA1C: Please consider ordering an A1C to evaluate glycemic control over the past 2-3 months.   Thanks, Orlando PennerMarie Aidyn Kellis, RN, MSN, CDE Diabetes Coordinator Inpatient Diabetes Program [phone] (Team Pager from 8am to 5pm) [phone] ([facility] office) [phone] [facility] office) [phone] [facility] office)

## 2015-01-15 ENCOUNTER — Inpatient Hospital Stay (HOSPITAL_COMMUNITY): Payer: Medicaid Other

## 2015-01-15 DIAGNOSIS — I609 Nontraumatic subarachnoid hemorrhage, unspecified: Secondary | ICD-10-CM

## 2015-01-15 LAB — GLUCOSE, CAPILLARY
GLUCOSE-CAPILLARY: 136 mg/dL — AB (ref 65–99)
GLUCOSE-CAPILLARY: 145 mg/dL — AB (ref 65–99)
Glucose-Capillary: 158 mg/dL — ABNORMAL HIGH (ref 65–99)
Glucose-Capillary: 166 mg/dL — ABNORMAL HIGH (ref 65–99)
Glucose-Capillary: 143 mg/dL — ABNORMAL HIGH (ref 65–99)
Glucose-Capillary: 138 mg/dL — ABNORMAL HIGH (ref 65–99)

## 2015-01-15 LAB — BASIC METABOLIC PANEL
Anion gap: 8 (ref 5–15)
BUN: 16 mg/dL (ref 6–20)
CALCIUM: 9.4 mg/dL (ref 8.9–10.3)
CO2: 21 mmol/L — AB (ref 22–32)
CREATININE: 0.84 mg/dL (ref 0.44–1.00)
Chloride: 118 mmol/L — ABNORMAL HIGH (ref 101–111)
GFR calc non Af Amer: >60 mL/min (ref >60)
Glucose, Bld: 173 mg/dL — ABNORMAL HIGH (ref 65–99)
Potassium: 3.3 mmol/L — ABNORMAL LOW (ref 3.5–5.1)
SODIUM: 147 mmol/L — AB (ref 135–145)

## 2015-01-15 LAB — CBC
HCT: 35.8 % — ABNORMAL LOW (ref 36.0–46.0)
Hemoglobin: 11.5 g/dL — ABNORMAL LOW (ref 12.0–15.0)
MCH: 30.4 pg (ref 26.0–34.0)
MCHC: 32.1 g/dL (ref 30.0–36.0)
MCV: 94.7 fL (ref 78.0–100.0)
PLATELETS: 161 10*3/uL (ref 150–400)
RBC: 3.78 MIL/uL — AB (ref 3.87–5.11)
RDW: 15.1 % (ref 11.5–15.5)
WBC: 21.3 10*3/uL — AB (ref 4.0–10.5)

## 2015-01-15 LAB — TRIGLYCERIDES: TRIGLYCERIDES: 137 mg/dL (ref <150)

## 2015-01-15 MED ORDER — BISACODYL 10 MG RE SUPP
10.0000 mg | Freq: Every day | RECTAL | Status: DC | PRN
  Administered 2015-01-18: 10 mg via RECTAL
  Filled 2015-01-15: qty 1

## 2015-01-15 MED ORDER — FENTANYL CITRATE (PF) 100 MCG/2ML IJ SOLN: 100.0000 ug | INTRAMUSCULAR | Status: DC | PRN

## 2015-01-15 MED ORDER — SIMVASTATIN 20 MG PO TABS: 20.0000 mg | ORAL_TABLET | Freq: Every day | ORAL | Status: DC

## 2015-01-15 MED ORDER — SENNOSIDES 8.8 MG/5ML PO SYRP: 5.0000 mL | ORAL_SOLUTION | Freq: Two times a day (BID) | ORAL | Status: DC | PRN

## 2015-01-15 MED ORDER — PANTOPRAZOLE SODIUM 40 MG PO PACK
40.0000 mg | PACK | ORAL | Status: DC
  Administered 2015-01-15 – 2015-02-05 (×22): 40 mg
  Filled 2015-01-15 (×20): qty 20

## 2015-01-15 MED ORDER — LEVETIRACETAM 100 MG/ML PO SOLN
500.0000 mg | Freq: Two times a day (BID) | ORAL | Status: DC
  Administered 2015-01-15 – 2015-02-14 (×61): 500 mg
  Filled 2015-01-15 (×74): qty 5

## 2015-01-15 MED ORDER — PROPOFOL 1000 MG/100ML IV EMUL
0.0000 ug/kg/min | INTRAVENOUS | Status: DC
  Administered 2015-01-15: 30 ug/kg/min via INTRAVENOUS

## 2015-01-15 MED ORDER — SODIUM CHLORIDE 0.9 % IV SOLN
25.0000 ug/h | INTRAVENOUS | Status: DC
  Administered 2015-01-15: 25 ug/h via INTRAVENOUS
  Administered 2015-01-16: 40 ug/h via INTRAVENOUS
  Administered 2015-01-18: 100 ug/h via INTRAVENOUS
  Administered 2015-01-21: 25 ug/h via INTRAVENOUS
  Administered 2015-01-22 – 2015-01-24 (×2): 50 ug/h via INTRAVENOUS
  Administered 2015-01-26 – 2015-01-27 (×2): 60 ug/h via INTRAVENOUS
  Administered 2015-01-29: 75 ug/h via INTRAVENOUS
  Filled 2015-01-15 (×9): qty 50

## 2015-01-15 MED ORDER — SIMVASTATIN 20 MG PO TABS
20.0000 mg | ORAL_TABLET | Freq: Every day | ORAL | Status: DC
  Administered 2015-01-15 – 2015-02-13 (×30): 20 mg
  Filled 2015-01-15 (×30): qty 1

## 2015-01-15 MED ORDER — FENTANYL CITRATE (PF) 100 MCG/2ML IJ SOLN
100.0000 ug | INTRAMUSCULAR | Status: DC | PRN
  Administered 2015-01-27: 100 ug via INTRAVENOUS

## 2015-01-15 NOTE — Progress Notes (Signed)
Found ETT at 22cm, advanced to 24cm which was previously documented.

## 2015-01-15 NOTE — Progress Notes (Signed)
PULMONARY / CRITICAL CARE MEDICINE   Name: Rachel Vang MRN: [id number] DOB: 09/21/1965    ADMISSION DATE:  01/13/2015  REFERRING MD:  EDP Lucienne Minks CHIEF COMPLAINT:  AMS  SUBJECTIVE:  Tolerating pressure support.  VITAL SIGNS: BP 128/78 mmHg  Pulse 77  Temp(Src) 98.4 F (36.9 C) (Core (Comment))  Resp 24  Wt 168 lb 14 oz (76.6 kg)  SpO2 99%  LMP  (LMP Unknown)  HEMODYNAMICS: CVP:  [5 mmHg-16 mmHg] 15 mmHg  VENTILATOR SETTINGS: Vent Mode:  [-] PRVC FiO2 (%):  [40 %] 40 % Set Rate:  [24 bmp] 24 bmp Vt Set:  [500 mL] 500 mL PEEP:  [5 cmH20] 5 cmH20 Plateau Pressure:  [15 cmH20-19 cmH20] 18 cmH20  INTAKE / OUTPUT: I/O last 3 completed shifts: In: 9603.7 [I.V.:8303.7; NG/GT:545; IV Piggyback:755] Out: 4098 [JXBJY:[id number] [Urine:6955; Emesis/NG output:702; Drains:95]  PHYSICAL EXAMINATION: General: sedated Neuro: RASS -2, moves extremities, not following commands HEENT: Pupils reactive Cardiovascular: regular, no murmur Lungs: scattered rhonchi Abdomen: soft, non tender Musculoskeletal: no edema Skin: no rashes  LABS:  BMET  Recent Labs Lab 01/13/15 1100 01/13/15 1826 01/14/15 0600  NA 138 140 142  K 3.1* 4.1 3.5  CL 102  --  111  CO2 20*  --  20*  BUN 13  --  10  CREATININE 1.11*  --  1.01*  GLUCOSE 262*  --  172*    Electrolytes  Recent Labs Lab 01/13/15 1100 01/13/15 1328 01/14/15 0600  CALCIUM 9.5  --  8.5*  MG  --  1.7  --   PHOS  --  2.3*  --     CBC  Recent Labs Lab 01/13/15 1100 01/13/15 1328 01/13/15 1826 01/14/15 0600  WBC 15.0* 25.8*  --  16.0*  HGB 14.4 14.1 12.6 11.7*  HCT 44.1 43.2 37.0 34.7*  PLT 246 214  --  135*    Coag's  Recent Labs Lab 01/13/15 1100  APTT 28  INR 1.07    Sepsis Markers  Recent Labs Lab 01/13/15 1123 01/13/15 1500  LATICACIDVEN 7.95* 5.5*    ABG  Recent Labs Lab 01/13/15 1204 01/13/15 1826  PHART 7.152* 7.367  PCO2ART 59.8* 34.9*  PO2ART 82.0 122.0*    Liver Enzymes  Recent  Labs Lab 01/13/15 1100  AST 33  ALT 26  ALKPHOS 104  BILITOT 0.6  ALBUMIN 4.0    Cardiac Enzymes  Recent Labs Lab 01/13/15 1328 01/13/15 2010 01/14/15 0030  TROPONINI 1.51* 2.29* 2.19*    Glucose  Recent Labs Lab 01/14/15 1152 01/14/15 1528 01/14/15 2033 01/14/15 2345 01/15/15 0320 01/15/15 0813  GLUCAP 140* 139* 161* 136* 158* 166*    Imaging No results found.   STUDIES:  12[facility]15 CT [facility] Echo >> severe LVH, EF 60 to 65%, grade 1 diastolic dysfx  CULTURES:  ANTIBIOTICS: 12/15 Cefazolin >>  SIGNIFICANT EVENTS: 12/15 Admit, SAH >> coiling distal ACA aneurysm, IVC drain placed  LINES/TUBES: 12/15 ETT >> 12/15 Rt IJ CVL >> 12/15 IVC >>   DISCUSSION:   49 yo female smoker presented with altered mental status, Rt pupil dilation from HTN emergency (BP 245/194) and SAH.  ASSESSMENT / PLAN:  NEUROLOGIC A:   [facility]. Possible seizure prior to admission. P:   IVC drain, decadron, AEDs per neurology Continue NS at 100 ml/hr for now Continue nimotop Add zocor 12/17 RASS goal 0  PULMONARY A: Compromise airway 2nd to acute encephalopathy. Tobacco abuse. P:  Pressure support wean as tolerated > extubation trial when mental status better F/u CXR  CARDIOVASCULAR A:  HTN emergency. Elevated troponin - demand ischemia. P:  Cardene to keep SBP < 140 Defer cardiology assessment for now  RENAL A:   Lactic acidosis. P:   F/u lactic acid F/u BMET Keep foley in for now >> need to monitor I/O closely during critical illness  GASTROINTESTINAL A:   Nutrition. P:   Tube feeds while on vent Protonix for SUP  HEMATOLOGIC A:   Mild anemia, thrombocytopenia of critical illness P:  F/u CBC SCDs for DVT prevention  INFECTIOUS A:   Post-IVC placement prophylaxis. P:   Ancef per neurosurgery  ENDOCRINE A:   Hyperglycemia. P:   SSI  CC time 34 minutes.  Coralyn Helling, MD [facility]  Pulmonary/Critical Care 01/15/2015, 9:03 AM Pager:  [phone] After 3pm call: [phone]

## 2015-01-15 NOTE — Progress Notes (Addendum)
Transcranial Doppler  Date POD PCO2 HCT BP  MCA ACA PCA OPHT SIPH VERT Basilar  01/15/15 JE     Right  Left   72  105   -47  -61   29  43   16  17   -54     -32     -32 ?           Right  Left                                            Right  Left                                             Right  Left                                             Right  Left                                            Right  Left                                            Right  Left                                        MCA = Middle Cerebral Artery      OPHT = Opthalmic Artery     BASILAR = Basilar Artery   ACA = Anterior Cerebral Artery     SIPH = Carotid Siphon PCA = Posterior Cerebral Artery   VERT = Verterbral Artery                   Normal MCA = 62+\-12 ACA = 50+\-12 PCA = 42+\-23   01/15/15 Unable to insonate left siphon (pt was becoming agitated at this point). Unable to attempt left vertebral due to left neck line. Unsure if basilar mean velocity is accurate due to poor window/ positioning. JE

## 2015-01-15 NOTE — Progress Notes (Signed)
Pt seen and examined. No issues overnight.   EXAM: Temp:  [97.9 F (36.6 C)-99.5 F (37.5 C)] 98.6 F (37 C) (12/17 0900) Pulse Rate:  [38-128] 78 (12/17 0900) Resp:  [11-28] 18 (12/17 0900) BP: (104-179)/(58-95) 127/82 mmHg (12/17 0900) SpO2:  [95 %-100 %] 98 % (12/17 0900) Arterial Line BP: (87-181)/(53-91) 170/67 mmHg (12/17 0900) FiO2 (%):  [40 %] 40 % (12/17 0850) Weight:  [76.6 kg (168 lb 14 oz)] 76.6 kg (168 lb 14 oz) (12/17 0351) Intake/Output      12/16 0701 - 12/17 0700 12/17 0701 - 12/18 0700   I.V. (mL/kg) 5337.9 (69.7) 849.1 (11.1)   Other     NG/GT 545 100   IV Piggyback 405    Total Intake(mL/kg) 6287.9 (82.1) 949.1 (12.4)   Urine (mL/kg/hr) 3780 (2.1)    Emesis/NG output 302 (0.2)    Drains 57 (0) 5 (0)   Total Output 4139 5   Net +2148.9 +944.1          On 40mcg Propofol: Opens eyes Breathing over vent Withdraws BUE/BLE, ?LUE>RUE EVD in place, blood tinged CSF draining  LABS: Lab Results  Component Value Date   CREATININE 1.01* 01/14/2015   BUN 10 01/14/2015   NA 142 01/14/2015   K 3.5 01/14/2015   CL 111 01/14/2015   CO2 20* 01/14/2015   Lab Results  Component Value Date   WBC 16.0* 01/14/2015   HGB 11.7* 01/14/2015   HCT 34.7* 01/14/2015   MCV 93.0 01/14/2015   PLT 135* 01/14/2015    IMPRESSION: - 49 y.o. female SAH d# 2 s/p coiling distal ACA aneurysm. - Remains comatose  PLAN: - Will drop EVD to 0 to drain CSF, may help reduce risk of spasm - Will d/c Ancef - Cont Nimotop/Zocor - Change Propofol to fentanyl gtt - Cont to liberalize SBP goal, ok to be up to 180mmHg - Wean vent per PCCM - TF started

## 2015-01-16 ENCOUNTER — Inpatient Hospital Stay (HOSPITAL_COMMUNITY): Payer: Medicaid Other

## 2015-01-16 LAB — GLUCOSE, CAPILLARY
GLUCOSE-CAPILLARY: 164 mg/dL — AB (ref 65–99)
GLUCOSE-CAPILLARY: 172 mg/dL — AB (ref 65–99)
GLUCOSE-CAPILLARY: 161 mg/dL — AB (ref 65–99)
Glucose-Capillary: 145 mg/dL — ABNORMAL HIGH (ref 65–99)
Glucose-Capillary: 170 mg/dL — ABNORMAL HIGH (ref 65–99)
Glucose-Capillary: 152 mg/dL — ABNORMAL HIGH (ref 65–99)
Glucose-Capillary: 161 mg/dL — ABNORMAL HIGH (ref 65–99)

## 2015-01-16 LAB — LACTIC ACID, PLASMA: Lactic Acid, Venous: 2 mmol/L (ref 0.5–2.0)

## 2015-01-16 MED ORDER — SODIUM CHLORIDE 0.9 % IJ SOLN: 10.0000 mL | INTRAMUSCULAR | Status: DC | PRN

## 2015-01-16 MED ORDER — SODIUM CHLORIDE 0.9 % IJ SOLN
10.0000 mL | Freq: Two times a day (BID) | INTRAMUSCULAR | Status: DC
  Administered 2015-01-16: 20 mL
  Administered 2015-01-17 – 2015-01-19 (×5): 10 mL
  Administered 2015-01-19: 30 mL
  Administered 2015-01-20: 20 mL
  Administered 2015-01-20 – 2015-01-21 (×2): 10 mL
  Administered 2015-01-22: 30 mL
  Administered 2015-01-22: 10 mL
  Administered 2015-01-23: 30 mL
  Administered 2015-01-23 – 2015-01-25 (×4): 10 mL
  Administered 2015-01-26: 30 mL
  Administered 2015-01-27: 10 mL
  Administered 2015-01-27: 30 mL
  Administered 2015-01-28: 20 mL
  Administered 2015-01-28 – 2015-01-30 (×4): 10 mL
  Administered 2015-01-31: 30 mL
  Administered 2015-01-31 – 2015-02-07 (×13): 10 mL

## 2015-01-16 MED ORDER — POTASSIUM CHLORIDE 20 MEQ/15ML (10%) PO SOLN
40.0000 meq | Freq: Once | ORAL | Status: AC
  Administered 2015-01-16: 40 meq
  Filled 2015-01-16: qty 30

## 2015-01-16 NOTE — Progress Notes (Signed)
Peripherally Inserted Central Catheter/Midline Placement  The IV Nurse has discussed with the patient and/or persons authorized to consent for the patient, the purpose of this procedure and the potential benefits and risks involved with this procedure.  The benefits include less needle sticks, lab draws from the catheter and patient may be discharged home with the catheter.  Risks include, but not limited to, infection, bleeding, blood clot (thrombus formation), and puncture of an artery; nerve damage and irregular heat beat.  Alternatives to this procedure were also discussed.  Cons[facility] phone.  PICC/Midline Placement Documentation  PICC Triple Lumen 01/16/15 PICC Right Basilic 40 cm 2 cm (Active)  Indication for Insertion or Continuance of Line Prolonged intravenous therapies 01/16/2015  5:57 PM  Exposed Catheter (cm) 2 cm 01/16/2015  5:57 PM  Site Assessment Clean;Dry;Intact 01/16/2015  5:57 PM  Lumen #1 Status Flushed;Saline locked;Blood return noted 01/16/2015  5:57 PM  Lumen #2 Status Flushed;Saline locked;Blood return noted 01/16/2015  5:57 PM  Lumen #3 Status Flushed;Saline locked;Blood return noted 01/16/2015  5:57 PM  Dressing Type Transparent 01/16/2015  5:57 PM  Dressing Status Clean;Dry;Intact 01/16/2015  5:57 PM  Dressing Change Due 01/23/15 01/16/2015  5:57 PM       Ethelda Chickurrie, Shelene Krage Robert 01/16/2015, 5:59 PM

## 2015-01-16 NOTE — Progress Notes (Signed)
Attempted to call sister for PICC line consent.  No answer from number provided. RN notified.

## 2015-01-16 NOTE — Progress Notes (Signed)
PULMONARY / CRITICAL CARE MEDICINE   Name: NIKOLE SWARTZENTRUBER MRN: [id number] DOB: 1965-02-15    ADMISSION DATE:  01/13/2015  REFERRING MD:  EDP  CHIEF COMPLAINT:  AMS  SUBJECTIVE:  Tolerating pressure support.  VITAL SIGNS: BP 135/77 mmHg  Pulse 65  Temp(Src) 97.9 F (36.6 C) (Axillary)  Resp 16  Wt 166 lb 3.6 oz (75.4 kg)  SpO2 96%  LMP  (LMP Unknown)  HEMODYNAMICS: CVP:  [10 mmHg-15 mmHg] 12 mmHg  VENTILATOR SETTINGS: Vent Mode:  [-] PRVC FiO2 (%):  [40 %] 40 % Set Rate:  [16 bmp] 16 bmp Vt Set:  [500 mL] 500 mL PEEP:  [5 cmH20] 5 cmH20 Pressure Support:  [10 cmH20] 10 cmH20 Plateau Pressure:  [10 cmH20-20 cmH20] 20 cmH20  INTAKE / OUTPUT: I/O last 3 completed shifts: In: 7827.5 [I.V.:5362.5; NG/GT:2260; IV Piggyback:205] Out: 5006 [Urine:4900; Drains:106]  PHYSICAL EXAMINATION: General: sedated Neuro: RASS -2, moves extremities, not following commands HEENT: Pupils pinpoint Cardiovascular: regular, no murmur Lungs: scattered rhonchi Abdomen: soft, non tender Musculoskeletal: no edema Skin: no rashes  LABS:  BMET  Recent Labs Lab 01/13/15 1100 01/13/15 1826 01/14/15 0600 01/15/15 1144  NA 138 140 142 147*  K 3.1* 4.1 3.5 3.3*  CL 102  --  111 118*  CO2 20*  --  20* 21*  BUN 13  --  10 16  CREATININE 1.11*  --  1.01* 0.84  GLUCOSE 262*  --  172* 173*    Electrolytes  Recent Labs Lab 01/13/15 1100 01/13/15 1328 01/14/15 0600 01/15/15 1144  CALCIUM 9.5  --  8.5* 9.4  MG  --  1.7  --   --   PHOS  --  2.3*  --   --     CBC  Recent Labs Lab 01/13/15 1328 01/13/15 1826 01/14/15 0600 01/15/15 1144  WBC 25.8*  --  16.0* 21.3*  HGB 14.1 12.6 11.7* 11.5*  HCT 43.2 37.0 34.7* 35.8*  PLT 214  --  135* 161    Coag's  Recent Labs Lab 01/13/15 1100  APTT 28  INR 1.07    Sepsis Markers  Recent Labs Lab 01/13/15 1123 01/13/15 1500 01/16/15 0600  LATICACIDVEN 7.95* 5.5* 2.0    ABG  Recent Labs Lab 01/13/15 1204  01/13/15 1826  PHART 7.152* 7.367  PCO2ART 59.8* 34.9*  PO2ART 82.0 122.0*    Liver Enzymes  Recent Labs Lab 01/13/15 1100  AST 33  ALT 26  ALKPHOS 104  BILITOT 0.6  ALBUMIN 4.0    Cardiac Enzymes  Recent Labs Lab 01/13/15 1328 01/13/15 2010 01/14/15 0030  TROPONINI 1.51* 2.29* 2.19*    Glucose  Recent Labs Lab 01/15/15 1155 01/15/15 1621 01/15/15 1919 01/15/15 2318 01/16/15 0350 01/16/15 0813  GLUCAP 143* 138* 145* 145* 164* 170*    Imaging No results found.   STUDIES:  12/15 CT head >> [facility] 12/16 Echo >> severe LVH, EF 60 to 65%, grade 1 diastolic dysfx  CULTURES:  ANTIBIOTICS: 12/15 Cefazolin >> 12/17  SIGNIFICANT EVENTS: 12/15 Admit, SAH >> coiling distal ACA aneurysm, IVC drain placed  LINES/TUBES: 12/15 ETT >> 12/15 Rt IJ CVL >> 12/15 IVC >>   DISCUSSION:   49 yo female smoker presented with altered mental status, Rt pupil dilation from HTN emergency (BP 245/194) and SAH.  ASSESSMENT / PLAN:  NEUROLOGIC A:   Acute encephalopathy 2nd to [facility] s/p coiling. Possible seizure prior to admission. P:   IVC drain, decadron, AEDs per neurology Continue NS at  100 ml/hr for now Continue nimotop, zocor RASS goal 0  PULMONARY A: Compromise airway 2nd to acute encephalopathy. Tobacco abuse. P:   Pressure support wean as tolerated > extubation trial when mental status better Might be candidate for early trach if family wishes to continue aggressive care F/u CXR  CARDIOVASCULAR A:  HTN emergency. Elevated troponin - demand ischemia. P:  Cardene to keep SBP < 140 Defer cardiology assessment for now Have PICC line placed  RENAL A:   Lactic acidosis >> resolved. Hypokalemia. P:   Replace electrolytes F/u BMET Keep foley in for now >> need to monitor I/O closely during critical illness  GASTROINTESTINAL A:   Nutrition. P:   Tube feeds while on vent Protonix for SUP  HEMATOLOGIC A:   Mild anemia, thrombocytopenia of  critical illness P:  F/u CBC SCDs for DVT prevention  INFECTIOUS A:   Post-IVC placement prophylaxis >> completed 12/17. P:   Monitor off Abx  ENDOCRINE A:   Hyperglycemia. P:   SSI  CC time 31 minutes.  Coralyn HellingVineet Geral Coker, MD [facility] 01/16/2015, 8:28 AM Pager:  [phone] After 3pm call: [phone]

## 2015-01-16 NOTE — Progress Notes (Signed)
Second attempt to contact sister for PICC placement consent unsuccessful.

## 2015-01-17 ENCOUNTER — Inpatient Hospital Stay (HOSPITAL_COMMUNITY): Payer: Medicaid Other

## 2015-01-17 DIAGNOSIS — I609 Nontraumatic subarachnoid hemorrhage, unspecified: Secondary | ICD-10-CM

## 2015-01-17 LAB — BASIC METABOLIC PANEL
Anion gap: 11 (ref 5–15)
Anion gap: 11 (ref 5–15)
BUN: 22 mg/dL — AB (ref 6–20)
BUN: 22 mg/dL — AB (ref 6–20)
CALCIUM: 9.6 mg/dL (ref 8.9–10.3)
CHLORIDE: 105 mmol/L (ref 101–111)
CO2: 25 mmol/L (ref 22–32)
CO2: 28 mmol/L (ref 22–32)
CREATININE: 0.76 mg/dL (ref 0.44–1.00)
CREATININE: 0.69 mg/dL (ref 0.44–1.00)
Calcium: 9.9 mg/dL (ref 8.9–10.3)
Chloride: 106 mmol/L (ref 101–111)
GFR calc Af Amer: >60 mL/min (ref >60)
GFR calc Af Amer: >60 mL/min (ref >60)
GFR calc non Af Amer: >60 mL/min (ref >60)
GLUCOSE: 271 mg/dL — AB (ref 65–99)
GLUCOSE: 170 mg/dL — AB (ref 65–99)
POTASSIUM: 3.8 mmol/L (ref 3.5–5.1)
Potassium: 3.6 mmol/L (ref 3.5–5.1)
SODIUM: 142 mmol/L (ref 135–145)
Sodium: 144 mmol/L (ref 135–145)

## 2015-01-17 LAB — GLUCOSE, CAPILLARY
GLUCOSE-CAPILLARY: 131 mg/dL — AB (ref 65–99)
GLUCOSE-CAPILLARY: 206 mg/dL — AB (ref 65–99)
Glucose-Capillary: 191 mg/dL — ABNORMAL HIGH (ref 65–99)
Glucose-Capillary: 198 mg/dL — ABNORMAL HIGH (ref 65–99)
Glucose-Capillary: 194 mg/dL — ABNORMAL HIGH (ref 65–99)
Glucose-Capillary: 155 mg/dL — ABNORMAL HIGH (ref 65–99)

## 2015-01-17 LAB — CBC
HCT: 38.3 % (ref 36.0–46.0)
Hemoglobin: 12.4 g/dL (ref 12.0–15.0)
MCH: 31.1 pg (ref 26.0–34.0)
MCHC: 32.4 g/dL (ref 30.0–36.0)
MCV: 96 fL (ref 78.0–100.0)
PLATELETS: 181 10*3/uL (ref 150–400)
RBC: 3.99 MIL/uL (ref 3.87–5.11)
RDW: 15 % (ref 11.5–15.5)
WBC: 31.9 10*3/uL — ABNORMAL HIGH (ref 4.0–10.5)

## 2015-01-17 LAB — MAGNESIUM: MAGNESIUM: 2 mg/dL (ref 1.7–2.4)

## 2015-01-17 MED ORDER — DOPAMINE-DEXTROSE 3.2-5 MG/ML-% IV SOLN
0.0000 ug/kg/min | INTRAVENOUS | Status: DC
  Administered 2015-01-18: 5 ug/kg/min via INTRAVENOUS
  Filled 2015-01-17: qty 250

## 2015-01-17 MED ORDER — POTASSIUM CHLORIDE 20 MEQ/15ML (10%) PO SOLN
20.0000 meq | ORAL | Status: AC
  Administered 2015-01-17 (×2): 20 meq
  Filled 2015-01-17 (×2): qty 15

## 2015-01-17 MED ORDER — SODIUM CHLORIDE 0.9 % IV SOLN: INTRAVENOUS | Status: DC | PRN

## 2015-01-17 NOTE — Clinical Social Work Note (Signed)
CSW received call from family member requesting guidance on applying for Medicaid for the patient. CSW informed family member [place] [name] She plans to start this today. CSW signing off at this time.    [name] Rachel Vang MSW, [place], [place], [phone]

## 2015-01-17 NOTE — Progress Notes (Signed)
During shift change assessment with MD at bedside, pt opened eyes to pain, did not follow commands and minimally withdrew on L side.  Slight flicker R side. PERRL 2 mm sluggish. EVD draining clear CSF. Orders received to titrate cardene off to achieve goal SBP 200, place A-line, and titrate saline for total IVF 150 cc/hr. May need to start pressors per MD if BP goal not achieved. Will continue to monitor. RT notified regarding A-line.

## 2015-01-17 NOTE — Progress Notes (Signed)
New A-line reading higher than cuff, currently 30-40 points higher systolic than Cuff 191/101. MD aware, no new orders received. will continue with goal SBP of 200. MD also updated that pt now more briskly withdrawing (L >R) and required turning fentanyl back on for RASS management. Will continue to monitor.

## 2015-01-17 NOTE — Procedures (Signed)
Arterial Catheter Insertion Procedure Note Lucienne MinksStephanie N Woolstenhulme [id number] 11/16/1965  Procedure: Insertion of Arterial Catheter  Indications: Blood pressure monitoring  Procedure Details Consent: Risks of procedure as well as the alternatives and risks of each were explained to the (patient/caregiver).  Consent for procedure obtained. Time Out: Verified patient identification, verified procedure, site/side was marked, verified correct patient position, special equipment/implants available, medications/allergies/relevent history reviewed, required imaging and test results available.  Performed  Maximum sterile technique was used including antiseptics. Skin prep: Chlorhexidine; local anesthetic administered 20 gauge catheter was inserted into left radial artery using the Seldinger technique.  Evaluation Blood flow good; BP tracing good. Complications: No apparent complications.   Newt LukesGroendal, Caroleann Casler Ann 01/17/2015

## 2015-01-17 NOTE — Progress Notes (Signed)
[facility] FOR AM LAB REPLACEMENT ONLY  The patient does apply for the [facility] Electrolyte Replac[facility] Protocol based on the criteria listed below:   1. Is GFR >/= 40 ml/min? Yes.    Patient's GFR today is >60 2. Is urine output >/= 0.5 ml/kg/hr for the last 6 hours? Yes.   Patient's UOP is 1.5 ml/kg/hr 3. Is BUN < 60 mg/dL? Yes.    Patient's BUN today is 22 4. Abnormal electrolyte(s): K+3.6 5. Ordered repletion with: protocol 6. If a panic level lab has been reported, has the CCM MD in charge been notified? No..   Physician:  Anselm PancoastSommer  Darl Brisbin, Renae Fickleaul Hilliard 01/17/2015 5:45 AM

## 2015-01-17 NOTE — Progress Notes (Signed)
Pt seen and examined. Remains intubated. Per RN, pt has become progressively less responsive to pain on the right side since yesterday.  EXAM: Temp:  [97.9 F (36.6 C)-98.6 F (37 C)] 98.2 F (36.8 C) (12/19 0430) Pulse Rate:  [57-84] 84 (12/19 0430) Resp:  [11-24] 24 (12/19 0430) BP: (124-177)/(72-96) 166/77 mmHg (12/19 0430) SpO2:  [94 %-100 %] 95 % (12/19 0430) Arterial Line BP: (103-191)/(69-121) 136/88 mmHg (12/19 0300) FiO2 (%):  [40 %] 40 % (12/19 0303) Weight:  [75.7 kg (166 lb 14.2 oz)] 75.7 kg (166 lb 14.2 oz) (12/19 0355) Intake/Output      12/18 0701 - 12/19 0700 12/19 0701 - 12/20 0700   I.V. (mL/kg) 3062.9 (40.5) 113.3 (1.5)   NG/GT 1100    Total Intake(mL/kg) 4162.9 (55) 113.3 (1.5)   Urine (mL/kg/hr) 2950 (1.6)    Drains 93 (0.1)    Total Output 3043     Net +1119.9 +113.3         On 3825mcg/hr fentanyl: Eyes open, blinks. Not tracking Pupils reactive W/D to pain LUE/LLE Minimal movements RUE./RLE EVD in place, draining at 0  LABS: Lab Results  Component Value Date   CREATININE 0.76 01/17/2015   BUN 22* 01/17/2015   NA 142 01/17/2015   K 3.6 01/17/2015   CL 106 01/17/2015   CO2 25 01/17/2015   Lab Results  Component Value Date   WBC 31.9* 01/17/2015   HGB 12.4 01/17/2015   HCT 38.3 01/17/2015   MCV 96.0 01/17/2015   PLT 181 01/17/2015    IMPRESSION: - 49 y.o. 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   This patient is at high risk for spasm given the amount of subarachnoid blood on admission. Her presenting high-grade and current condition makes her prognosis relatively poor. I would cont to attempt to treat her spasm medically, but I think endovascular treatment should it come to that would not be  reasonable. If she doesn't respond to medical hyperdynamic therapy and remains in her current state over the next few days, will likely have to discuss goals of care with her family.

## 2015-01-17 NOTE — Progress Notes (Signed)
Foley leaked, UOP increased at this time. Foley milked, emptied. Foley care done, linens changed. MD aware. Orders received for f/u labs this afternoon. Will continue to monitor UOP.

## 2015-01-17 NOTE — Progress Notes (Signed)
Art line pulled because unable to get to work again. No flow to catheter and no reading available. RN aware Art line had to be pulled and site was cleaned and secured per protocol.

## 2015-01-17 NOTE — Progress Notes (Signed)
Spoke with Dr. Franky Machoabbell about patient not meeting SBP goals. Orders received for dopamine. Will monitor.

## 2015-01-17 NOTE — Progress Notes (Signed)
PULMONARY / CRITICAL CARE MEDICINE   Name: Rachel Vang MRN: [id number] DOB: 10/31/1965    ADMISSION DATE:  01/13/2015  REFERRING MD:  EDP  CHIEF COMPLAINT:  AMS  SUBJECTIVE:    VITAL SIGNS: BP 174/95 mmHg  Pulse 80  Temp(Src) 98.6 F (37 C) (Core (Comment))  Resp 16  Wt 75.7 kg (166 lb 14.2 oz)  SpO2 100%  LMP  (LMP Unknown)  HEMODYNAMICS:    VENTILATOR SETTINGS: Vent Mode:  [-] PRVC FiO2 (%):  [40 %] 40 % Set Rate:  [16 bmp] 16 bmp Vt Set:  [500 mL] 500 mL PEEP:  [5 cmH20] 5 cmH20 Pressure Support:  [10 cmH20] 10 cmH20 Plateau Pressure:  [16 cmH20-23 cmH20] 16 cmH20  INTAKE / OUTPUT: I/O last 3 completed shifts: In: 7085.9 [I.V.:5135.9; NG/GT:1950] Out: 4697 [Urine:4550; Drains:147]  PHYSICAL EXAMINATION: General: sedated Neuro: RASS -2, moves extremities, not following commands HEENT: Pupils pinpoint Cardiovascular: regular, no murmur Lungs: scattered rhonchi Abdomen: soft, non tender Musculoskeletal: no edema Skin: no rashes  LABS:  BMET  Recent Labs Lab 01/14/15 0600 01/15/15 1144 01/17/15 0451  NA 142 147* 142  K 3.5 3.3* 3.6  CL 111 118* 106  CO2 20* 21* 25  BUN 10 16 22*  CREATININE 1.01* 0.84 0.76  GLUCOSE 172* 173* 271*    Electrolytes  Recent Labs Lab 01/13/15 1328 01/14/15 0600 01/15/15 1144 01/17/15 0451  CALCIUM  --  8.5* 9.4 9.6  MG 1.7  --   --  2.0  PHOS 2.3*  --   --   --     CBC  Recent Labs Lab 01/14/15 0600 01/15/15 1144 01/17/15 0451  WBC 16.0* 21.3* 31.9*  HGB 11.7* 11.5* 12.4  HCT 34.7* 35.8* 38.3  PLT 135* 161 181    Coag's  Recent Labs Lab 01/13/15 1100  APTT 28  INR 1.07    Sepsis Markers  Recent Labs Lab 01/13/15 1123 01/13/15 1500 01/16/15 0600  LATICACIDVEN 7.95* 5.5* 2.0    ABG  Recent Labs Lab 01/13/15 1204 01/13/15 1826  PHART 7.152* 7.367  PCO2ART 59.8* 34.9*  PO2ART 82.0 122.0*    Liver Enzymes  Recent Labs Lab 01/13/15 1100  AST 33  ALT 26   ALKPHOS 104  BILITOT 0.6  ALBUMIN 4.0    Cardiac Enzymes  Recent Labs Lab 01/13/15 1328 01/13/15 2010 01/14/15 0030  TROPONINI 1.51* 2.29* 2.19*    Glucose  Recent Labs Lab 01/16/15 1138 01/16/15 1551 01/16/15 1929 01/16/15 2323 01/17/15 0309 01/17/15 0731  GLUCAP 172* 152* 161* 161* 191* 198*    Imaging Dg Chest Port 1 View  01/17/2015  CLINICAL DATA:  Acute respiratory failure, acute intracranial hemorrhage. EXAM: PORTABLE CHEST 1 VIEW COMPARISON:  Portable chest x-ray of January 16, 2015 FINDINGS: There remains mild hypo inflation predominantly on the right. The pulmonary interstitial markings are minimally prominent though stable. There is no alveolar pneumonia. The endotracheal tube tip is 1.6 cm above the carina. The cardiac silhouette remains enlarged. The pulmonary vascularity is not engorged. The esophagogastric tube tip projects below the inferior margin of the image. The observed bony thorax is unremarkable. IMPRESSION: 1. The endotracheal tube tip lies approximately 1.6 cm above the carina. Withdrawal by 2 cm is recommended. 2. Mild hypo inflation especially on the right. Minimal stable interstitial prominence bilaterally. There is no alveolar pneumonia. Electronically Signed   By: David  Swazil[place]Jordan M.D.   On: 01/17/2015 08:27     STUDIES:  [facility]  12/16 Echo >> severe LVH, EF 60 to 65%, grade 1 diastolic dysfx  CULTURES:  ANTIBIOTICS: 12/15 Cefazolin >> 12/17  SIGNIFICANT EVENTS: 12/15 Admit, SAH >> coiling distal ACA aneurysm, IVC drain placed  LINES/TUBES: 12/15 ETT >> 12/15 Rt IJ CVL >> 12/15 IVC >>  12/18 R PICC >>   DISCUSSION:   49 yo female smoker presented with altered mental status, Rt pupil dilation from HTN emergency (BP 245/194) and SAH.  ASSESSMENT / PLAN:  NEUROLOGIC A:   Acute encephalopathy 2nd to [facility], s/p coiling. Possible seizure prior to admission. At risk vasospasm, note new decrease in MS 12/19 P:   IVC  drain, decadron, AEDs per neurology NS at 150 ml/hr Continue nimotop, zocor May need to add pressors to maintain new SBP goal of 200 RASS goal 0  PULMONARY A: Compromise airway 2nd to acute encephalopathy. Tobacco abuse. P:   Pressure support wean as tolerated > extubation trial when mental status better but not until then Might be candidate for early trach if family wishes to continue aggressive care F/u CXR  CARDIOVASCULAR A:  HTN emergency. Elevated troponin - demand ischemia. P:  Nicardipine d/c'd 12/19 in setting of new weakness and obtundation. New goal SBP 200, may need to start phenylephrine to maintain Defer cardiology assessment for now  RENAL A:   Lactic acidosis >> resolved. Hypokalemia. P:   Replace electrolytes F/u BMET Keep foley in for now >> need to monitor I/O closely during critical illness  GASTROINTESTINAL A:   Nutrition. P:   Tube feeds while on vent Protonix for SUP  HEMATOLOGIC A:   Mild anemia, thrombocytopenia of critical illness P:  F/u CBC SCDs for DVT prevention  INFECTIOUS A:   Post-IVC placement prophylaxis >> completed 12/17. P:   Monitor off Abx  ENDOCRINE A:   Hyperglycemia. P:   SSI  Independent CC time 35 minutes  Levy Pupa, MD, PhD 01/17/2015, 11:48 AM [facility] [phone] or if no answer [phone]

## 2015-01-17 NOTE — Progress Notes (Signed)
Transcranial Doppler  Date POD PCO2 HCT BP  MCA ACA PCA OPHT SIPH VERT Basilar  01/15/15 JE     Right  Left   72  105   -47  -61   29  43   16  17   -54     -32     -32 ?      01-17-15 jc     Right  Left   65  53   -67  -61   30  33   38  25     24     -47     -44         Right  Left                                             Right  Left                                             Right  Left                                            Right  Left                                            Right  Left                                        MCA = Middle Cerebral Artery      OPHT = Opthalmic Artery     BASILAR = Basilar Artery   ACA = Anterior Cerebral Artery     SIPH = Carotid Siphon PCA = Posterior Cerebral Artery   VERT = Verterbral Artery                   Normal MCA = 62+\-12 ACA = 50+\-12 PCA = 42+\-23   01/15/15 Unable to insonate left siphon (pt was becoming agitated at this point). Unable to attempt left vertebral due to left neck line. Unsure if basilar mean velocity is accurate due to poor window/ positioning. JE   Rachel ParkinHelene Audrina Vang, RVT 01/17/2015 3:33 PM

## 2015-01-18 DIAGNOSIS — I959 Hypotension, unspecified: Secondary | ICD-10-CM

## 2015-01-18 DIAGNOSIS — R4020 Unspecified coma: Secondary | ICD-10-CM

## 2015-01-18 LAB — BASIC METABOLIC PANEL
ANION GAP: 13 (ref 5–15)
BUN: 26 mg/dL — ABNORMAL HIGH (ref 6–20)
CALCIUM: 10.2 mg/dL (ref 8.9–10.3)
CO2: 27 mmol/L (ref 22–32)
Chloride: 105 mmol/L (ref 101–111)
Creatinine, Ser: 0.75 mg/dL (ref 0.44–1.00)
GFR calc Af Amer: >60 mL/min (ref >60)
GFR calc non Af Amer: >60 mL/min (ref >60)
GLUCOSE: 216 mg/dL — AB (ref 65–99)
Potassium: 3.6 mmol/L (ref 3.5–5.1)
Sodium: 145 mmol/L (ref 135–145)

## 2015-01-18 LAB — GLUCOSE, CAPILLARY
GLUCOSE-CAPILLARY: 191 mg/dL — AB (ref 65–99)
GLUCOSE-CAPILLARY: 147 mg/dL — AB (ref 65–99)
Glucose-Capillary: 174 mg/dL — ABNORMAL HIGH (ref 65–99)
Glucose-Capillary: 176 mg/dL — ABNORMAL HIGH (ref 65–99)
Glucose-Capillary: 165 mg/dL — ABNORMAL HIGH (ref 65–99)
Glucose-Capillary: 198 mg/dL — ABNORMAL HIGH (ref 65–99)

## 2015-01-18 LAB — CBC
HEMATOCRIT: 44 % (ref 36.0–46.0)
HEMOGLOBIN: 14.5 g/dL (ref 12.0–15.0)
MCH: 31.3 pg (ref 26.0–34.0)
MCHC: 33 g/dL (ref 30.0–36.0)
MCV: 95 fL (ref 78.0–100.0)
Platelets: 209 10*3/uL (ref 150–400)
RBC: 4.63 MIL/uL (ref 3.87–5.11)
RDW: 14.7 % (ref 11.5–15.5)
WBC: 23.3 10*3/uL — ABNORMAL HIGH (ref 4.0–10.5)

## 2015-01-18 MED ORDER — PHENYLEPHRINE HCL 10 MG/ML IJ SOLN
0.0000 ug/min | INTRAVENOUS | Status: DC
  Administered 2015-01-18: 20 ug/min via INTRAVENOUS
  Administered 2015-01-18: 80 ug/min via INTRAVENOUS
  Administered 2015-01-18: 120 ug/min via INTRAVENOUS
  Administered 2015-01-19 (×2): 140 ug/min via INTRAVENOUS
  Administered 2015-01-19: 120 ug/min via INTRAVENOUS
  Administered 2015-01-20: 190 ug/min via INTRAVENOUS
  Administered 2015-01-20: 195 ug/min via INTRAVENOUS
  Administered 2015-01-20: 180 ug/min via INTRAVENOUS
  Administered 2015-01-20: 185 ug/min via INTRAVENOUS
  Administered 2015-01-20: 225 ug/min via INTRAVENOUS
  Administered 2015-01-20: 195 ug/min via INTRAVENOUS
  Administered 2015-01-21 (×2): 225 ug/min via INTRAVENOUS
  Administered 2015-01-21: 250 ug/min via INTRAVENOUS
  Administered 2015-01-21: 240 ug/min via INTRAVENOUS
  Administered 2015-01-21: 230 ug/min via INTRAVENOUS
  Administered 2015-01-21: 250 ug/min via INTRAVENOUS
  Administered 2015-01-21: 235 ug/min via INTRAVENOUS
  Administered 2015-01-22: 360 ug/min via INTRAVENOUS
  Administered 2015-01-22: 330 ug/min via INTRAVENOUS
  Administered 2015-01-22: 260 ug/min via INTRAVENOUS
  Administered 2015-01-22: 340 ug/min via INTRAVENOUS
  Administered 2015-01-22 (×3): 350 ug/min via INTRAVENOUS
  Administered 2015-01-23: 314.667 ug/min via INTRAVENOUS
  Administered 2015-01-23: 300 ug/min via INTRAVENOUS
  Administered 2015-01-23: 320 ug/min via INTRAVENOUS
  Administered 2015-01-23: 400 ug/min via INTRAVENOUS
  Administered 2015-01-23: 390 ug/min via INTRAVENOUS
  Administered 2015-01-23: 400 ug/min via INTRAVENOUS
  Administered 2015-01-23: 325.333 ug/min via INTRAVENOUS
  Administered 2015-01-23: 380 ug/min via INTRAVENOUS
  Administered 2015-01-23: 320 ug/min via INTRAVENOUS
  Administered 2015-01-23: 390 ug/min via INTRAVENOUS
  Administered 2015-01-23: 320 ug/min via INTRAVENOUS
  Administered 2015-01-23: 400 ug/min via INTRAVENOUS
  Administered 2015-01-24: 200 ug/min via INTRAVENOUS
  Administered 2015-01-24: 225 ug/min via INTRAVENOUS
  Administered 2015-01-24 (×2): 200 ug/min via INTRAVENOUS
  Administered 2015-01-24: 240 ug/min via INTRAVENOUS
  Administered 2015-01-25: 190.133 ug/min via INTRAVENOUS
  Administered 2015-01-25 (×2): 190 ug/min via INTRAVENOUS
  Administered 2015-01-25 (×3): 200 ug/min via INTRAVENOUS
  Administered 2015-01-26: 210 ug/min via INTRAVENOUS
  Administered 2015-01-26: 209.6 ug/min via INTRAVENOUS
  Administered 2015-01-26: 230 ug/min via INTRAVENOUS
  Administered 2015-01-26: 185.067 ug/min via INTRAVENOUS
  Administered 2015-01-26: 210 ug/min via INTRAVENOUS
  Administered 2015-01-26: 220 ug/min via INTRAVENOUS
  Administered 2015-01-27: 90 ug/min via INTRAVENOUS
  Administered 2015-01-27: 260 ug/min via INTRAVENOUS
  Administered 2015-01-27: 220 ug/min via INTRAVENOUS
  Administered 2015-01-27: 230 ug/min via INTRAVENOUS
  Administered 2015-01-27 (×2): 250 ug/min via INTRAVENOUS
  Filled 2015-01-18 (×67): qty 4

## 2015-01-18 NOTE — Progress Notes (Signed)
PULMONARY / CRITICAL CARE MEDICINE   Name: Rachel Vang MRN: [id number] DOB: 01-27-1966    ADMISSION DATE:  01/13/2015  REFERRING MD:  EDP  CHIEF COMPLAINT:  AMS  SUBJECTIVE:  No events overnight, unresponsive.   VITAL SIGNS: BP 176/115 mmHg  Pulse 89  Temp(Src) 100 F (37.8 C) (Core (Comment))  Resp 14  Ht  (1.651 m)  Wt 70.9 kg (156 lb 4.9 oz)  BMI 26.01 kg/m2  SpO2 98%  LMP  (LMP Unknown)  HEMODYNAMICS:    VENTILATOR SETTINGS: Vent Mode:  [-] CPAP;PSV FiO2 (%):  [30 %-40 %] 30 % Set Rate:  [16 bmp] 16 bmp Vt Set:  [500 mL] 500 mL PEEP:  [5 cmH20] 5 cmH20 Pressure Support:  [5 cmH20] 5 cmH20 Plateau Pressure:  [11 cmH20-14 cmH20] 11 cmH20  INTAKE / OUTPUT: I/O last 3 completed shifts: In: 7161.7 [I.V.:5271.7; Other:40; NG/GT:1850] Out: 40981 [Urine:9940; Drains:119]  PHYSICAL EXAMINATION: General: Off sedation, comatose. Neuro: RASS -2, not withdrawing to pain. HEENT: Pupils pinpoint Cardiovascular: regular, no murmur Lungs: scattered rhonchi Abdomen: soft, non tender Musculoskeletal: no edema Skin: no rashes  LABS:  BMET  Recent Labs Lab 01/17/15 0451 01/17/15 1802 01/18/15 0600  NA 142 144 145  K 3.6 3.8 3.6  CL 106 105 105  CO2 BUN 22* 22* 26*  CREATININE 0.76 0.69 0.75  GLUCOSE 271* 170* 216*    Electrolytes  Recent Labs Lab 01/13/15 1328  01/17/15 0451 01/17/15 1802 01/18/15 0600  CALCIUM  --   < > 9.6 9.9 10.2  MG 1.7  --  2.0  --   --   PHOS 2.3*  --   --   --   --   < > = values in this interval not displayed.  CBC  Recent Labs Lab 01/15/15 1144 01/17/15 0451 01/18/15 0600  WBC 21.3* 31.9* 23.3*  HGB 11.5* 12.4 14.5  HCT 35.8* 38.3 44.0  PLT 161 181 209   Coag's  Recent Labs Lab 01/13/15 1100  APTT 28  INR 1.07   Sepsis Markers  Recent Labs Lab 01/13/15 1123 01/13/15 1500 01/16/15 0600  LATICACIDVEN 7.95* 5.5* 2.0   ABG  Recent Labs Lab 01/13/15 1204 01/13/15 1826   PHART 7.152* 7.367  PCO2ART 59.8* 34.9*  PO2ART 82.0 122.0*    Liver Enzymes  Recent Labs Lab 01/13/15 1100  AST 33  ALT 26  ALKPHOS 104  BILITOT 0.6  ALBUMIN 4.0   Cardiac Enzymes  Recent Labs Lab 01/13/15 1328 01/13/15 2010 01/14/15 0030  TROPONINI 1.51* 2.29* 2.19*   Glucose  Recent Labs Lab 01/17/15 1527 01/17/15 2012 01/17/15 2344 01/18/15 0350 01/18/15 0727 01/18/15 1227  GLUCAP 155* 206* 131* 191* 174* 176*   Imaging No results found.  STUDIES:  12/15 CT head >> [facility] 12/16 Echo >> severe LVH, EF 60 to 65%, grade 1 diastolic dysfx  CULTURES:  ANTIBIOTICS: 12/15 Cefazolin >> 12/17  SIGNIFICANT EVENTS: 12/15 Admit, SAH >> coiling distal ACA aneurysm, IVC drain placed  LINES/TUBES: 12/15 ETT >> 12/15 Rt IJ CVL >> 12/15 IVC >>  12/18 R PICC >>   DISCUSSION:   49 yo female smoker presented with altered mental status, Rt pupil dilation from HTN emergency (BP 245/194) and SAH.  ASSESSMENT / PLAN:  NEUROLOGIC A:   Acute encephalopathy 2nd to [facility], s/p coiling. Possible seizure prior to admission. At risk vasospasm, note new decrease in MS 12/19 P:   IVC drain, decadron, AEDs per neurology  NS at 150 ml/hr Continue nimotop, zocor Continue neo for target SBP of 200-220 per neuro's recommendations. RASS goal 0.  PULMONARY A: Compromise airway 2nd to acute encephalopathy. Tobacco abuse. P:   Pressure support wean as tolerated > extubation trial when mental status better but not until then Need to have a conversation with familiy regarding plan of care. Might be candidate for early trach if family wishes to continue aggressive care F/u CXR  CARDIOVASCULAR A:  HTN emergency. Elevated troponin - demand ischemia. P:  Nicardipine d/c'd 12/19 in setting of new weakness and obtundation. New goal SBP 200 Neo to SBP of 200-220. Defer cardiology assessment for now  RENAL A:   Lactic acidosis >> resolved. Hypokalemia. P:   Replace  electrolytes F/u BMET Keep foley in for now >> need to monitor I/O closely during critical illness  GASTROINTESTINAL A:   Nutrition. P:   Tube feeds while on vent Protonix for SUP  HEMATOLOGIC A:   Mild anemia, thrombocytopenia of critical illness P:  F/u CBC SCDs for DVT prevention  INFECTIOUS A:   Post-IVC placement prophylaxis >> completed 12/17. P:   Monitor off Abx  ENDOCRINE A:   Hyperglycemia. P:   SSI  The patient is critically ill with multiple organ systems failure and requires high complexity decision making for assessment and support, frequent evaluation and titration of therapies, application of advanced monitoring technologies and extensive interpretation of multiple databases.   Critical Care Time devoted to patient care services described in this note is  35  Minutes. This time reflects time of care of this signee Dr Koren BoundWesam Yacoub. This critical care time does not reflect procedure time, or teaching time or supervisory time of PA/NP/Med student/Med Resident etc but could involve care discussion time.  Alyson ReedyWesam G. Yacoub, M.D. [facility] Pulmonary/Critical Care Medicine. Pager: [phone]. After hours pager: [phone].

## 2015-01-18 NOTE — Progress Notes (Signed)
Notified Dr. Arsenio LoaderSommer of increased HR as dopamine was being increased. Order received for neo and to titrate dopamine off. Will monitor.

## 2015-01-18 NOTE — Care Management Note (Signed)
Case Management Note  Patient Details  Name: Rachel Vang MRN: [id number] Date of Birth: 11/24/1965  Subjective/Objective:  Pt admitted on 01/10/15 s/p ICH; remains unresponsive on vent.  PTA, pt independent, lives with spouse.                   Action/Plan: Will follow for discharge planning as pt progresses.    Expected Discharge Date:                  Expected Discharge Plan:  Hospice Medical Facility  In-House Referral:  Clinical Social Work  Discharge planning Services  CM Consult  Post Acute Care Choice:    Choice offered to:     DME Arranged:    DME Agency:     HH Arranged:    HH Agency:     Status of Service:  In process, will continue to follow  Medicare Important Message Given:    Date Medicare IM Given:    Medicare IM give by:    Date Additional Medicare IM Given:    Additional Medicare Important Message give by:     If discussed at Long Length of Stay Meetings, dates discussed:    Additional Comments:  Quintella BatonJulie W. Jersey Espinoza, RN, BSN  Trauma/Neuro ICU Case Manager [phone]

## 2015-01-18 NOTE — Progress Notes (Signed)
eLink Physician-Brief Progress Note Patient Name: Lucienne MinksStephanie N Blackston DOB: 01/02/1966 MRN: [id number]   Date of Service  01/18/2015  HPI/Events of Note  Dopamine started by Neurosurgery to push SBP up for cerebral vasospasm. Patient is now tachycardic most likely d/t Beta effect of Dopamine.   eICU Interventions  Will order: 1. Phenylephrine IV infusion. Titrate to SBP 200-220.  2. Wean Dopamine IV infusion off.      Intervention Category Major Interventions: Arrhythmia - evaluation and management  Sommer,Steven Eugene 01/18/2015, 5:59 AM

## 2015-01-19 ENCOUNTER — Ambulatory Visit (HOSPITAL_COMMUNITY): Payer: Medicaid Other | Attending: Neurosurgery

## 2015-01-19 ENCOUNTER — Inpatient Hospital Stay (HOSPITAL_COMMUNITY): Payer: Medicaid Other

## 2015-01-19 DIAGNOSIS — I67848 Other cerebrovascular vasospasm and vasoconstriction: Secondary | ICD-10-CM | POA: Insufficient documentation

## 2015-01-19 DIAGNOSIS — I609 Nontraumatic subarachnoid hemorrhage, unspecified: Secondary | ICD-10-CM

## 2015-01-19 LAB — URINALYSIS, ROUTINE W REFLEX MICROSCOPIC
BILIRUBIN URINE: NEGATIVE
GLUCOSE, UA: NEGATIVE mg/dL
Ketones, ur: NEGATIVE mg/dL
Nitrite: NEGATIVE
PH: 6 (ref 5.0–8.0)
Protein, ur: >300 mg/dL — AB
SPECIFIC GRAVITY, URINE: 1.021 (ref 1.005–1.030)

## 2015-01-19 LAB — BLOOD GAS, ARTERIAL
Acid-Base Excess: 1.7 mmol/L (ref 0.0–2.0)
BICARBONATE: 25.5 meq/L — AB (ref 20.0–24.0)
Drawn by: 24487
FIO2: 0.4
LHR: 16 {breaths}/min
O2 Saturation: 97.8 %
PATIENT TEMPERATURE: 98.6
PCO2 ART: 38.3 mmHg (ref 35.0–45.0)
PEEP: 5 cmH2O
PO2 ART: 107 mmHg — AB (ref 80.0–100.0)
TCO2: 26.7 mmol/L (ref 0–100)
VT: 0.5 mL
pH, Arterial: 7.439 (ref 7.350–7.450)

## 2015-01-19 LAB — BASIC METABOLIC PANEL
Anion gap: 9 (ref 5–15)
BUN: 29 mg/dL — AB (ref 6–20)
CHLORIDE: 111 mmol/L (ref 101–111)
CO2: 28 mmol/L (ref 22–32)
Calcium: 9.6 mg/dL (ref 8.9–10.3)
Creatinine, Ser: 0.63 mg/dL (ref 0.44–1.00)
GFR calc Af Amer: >60 mL/min (ref >60)
GFR calc non Af Amer: >60 mL/min (ref >60)
Glucose, Bld: 148 mg/dL — ABNORMAL HIGH (ref 65–99)
POTASSIUM: 3.3 mmol/L — AB (ref 3.5–5.1)
SODIUM: 148 mmol/L — AB (ref 135–145)

## 2015-01-19 LAB — GLUCOSE, CAPILLARY
GLUCOSE-CAPILLARY: 171 mg/dL — AB (ref 65–99)
GLUCOSE-CAPILLARY: 197 mg/dL — AB (ref 65–99)
GLUCOSE-CAPILLARY: 173 mg/dL — AB (ref 65–99)
GLUCOSE-CAPILLARY: 164 mg/dL — AB (ref 65–99)
Glucose-Capillary: 138 mg/dL — ABNORMAL HIGH (ref 65–99)
Glucose-Capillary: 163 mg/dL — ABNORMAL HIGH (ref 65–99)
Glucose-Capillary: 166 mg/dL — ABNORMAL HIGH (ref 65–99)
Glucose-Capillary: 174 mg/dL — ABNORMAL HIGH (ref 65–99)

## 2015-01-19 LAB — URINE MICROSCOPIC-ADD ON

## 2015-01-19 LAB — CBC
HEMATOCRIT: 44.5 % (ref 36.0–46.0)
HEMOGLOBIN: 14.4 g/dL (ref 12.0–15.0)
MCH: 31.2 pg (ref 26.0–34.0)
MCHC: 32.4 g/dL (ref 30.0–36.0)
MCV: 96.5 fL (ref 78.0–100.0)
Platelets: 242 10*3/uL (ref 150–400)
RBC: 4.61 MIL/uL (ref 3.87–5.11)
RDW: 14.7 % (ref 11.5–15.5)
WBC: 35.4 10*3/uL — ABNORMAL HIGH (ref 4.0–10.5)

## 2015-01-19 LAB — PHOSPHORUS: Phosphorus: 3.4 mg/dL (ref 2.5–4.6)

## 2015-01-19 LAB — C DIFFICILE QUICK SCREEN W PCR REFLEX
C DIFFICILE (CDIFF) INTERP: NEGATIVE
C DIFFICILE (CDIFF) TOXIN: NEGATIVE
C Diff antigen: NEGATIVE

## 2015-01-19 LAB — MAGNESIUM: MAGNESIUM: 2.1 mg/dL (ref 1.7–2.4)

## 2015-01-19 MED ORDER — PIPERACILLIN-TAZOBACTAM 3.375 G IVPB
3.3750 g | Freq: Three times a day (TID) | INTRAVENOUS | Status: DC
  Administered 2015-01-19 – 2015-01-22 (×10): 3.375 g via INTRAVENOUS
  Filled 2015-01-19 (×11): qty 50

## 2015-01-19 MED ORDER — VITAL AF 1.2 CAL PO LIQD
1000.0000 mL | ORAL | Status: DC
  Administered 2015-01-19 – 2015-01-26 (×7): 1000 mL
  Filled 2015-01-19 (×12): qty 1000

## 2015-01-19 MED ORDER — CHLORHEXIDINE GLUCONATE 0.12 % MT SOLN
OROMUCOSAL | Status: AC
  Administered 2015-01-19: 15 mL via OROMUCOSAL
  Filled 2015-01-19: qty 15

## 2015-01-19 MED ORDER — POTASSIUM CHLORIDE 20 MEQ/15ML (10%) PO SOLN
40.0000 meq | Freq: Three times a day (TID) | ORAL | Status: AC
  Administered 2015-01-19 (×2): 40 meq
  Filled 2015-01-19 (×2): qty 30

## 2015-01-19 MED ORDER — FENTANYL BOLUS VIA INFUSION
25.0000 ug | INTRAVENOUS | Status: DC | PRN
  Administered 2015-01-28 (×2): 50 ug via INTRAVENOUS
  Filled 2015-01-19: qty 50

## 2015-01-19 MED ORDER — VANCOMYCIN HCL IN DEXTROSE 750-5 MG/150ML-% IV SOLN
750.0000 mg | Freq: Three times a day (TID) | INTRAVENOUS | Status: DC
  Administered 2015-01-19 – 2015-01-22 (×10): 750 mg via INTRAVENOUS
  Filled 2015-01-19 (×11): qty 150

## 2015-01-19 NOTE — Progress Notes (Signed)
Transcranial Doppler  Date POD PCO2 HCT BP  MCA ACA PCA OPHT SIPH VERT Basilar  01/15/15 JE     Right  Left   72  105   -47  -61   29  43   16  17   -54     -32     -32 ?      01-17-15 hc     Right  Left   65  53   -67  -61   30  33   38  25     24     -47     -44    01/19/15 MS     Right  Left   72  81   -45  -77   32  40   24  31   33  *   *  *   *            Right  Left                                             Right  Left                                            Right  Left                                            Right  Left                                        MCA = Middle Cerebral Artery      OPHT = Opthalmic Artery     BASILAR = Basilar Artery   ACA = Anterior Cerebral Artery     SIPH = Carotid Siphon PCA = Posterior Cerebral Artery   VERT = Verterbral Artery                   Normal MCA = 62+\-12 ACA = 50+\-12 PCA = 42+\-23   01/15/15 Unable to insonate left siphon (pt was becoming agitated at this point). Unable to attempt left vertebral due to left neck line. Unsure if basilar mean velocity is accurate due to poor window/ positioning. JE  01/19/15- * Unable to insonate due to poor patient cooperation and poor acoustic window.  01/19/2015 11:46 AM Gertie FeyMichelle Marieliz Strang, RVT, RDCS, RDMS

## 2015-01-19 NOTE — Progress Notes (Signed)
PULMONARY / CRITICAL CARE MEDICINE   Name: Rachel Vang MRN: [id number] DOB: [birth year]    ADMISSION DATE:  01/13/2015  REFERRING MD:  EDP  CHIEF COMPLAINT:  AMS  SUBJECTIVE:  No events overnight, unresponsive.   VITAL SIGNS: BP 180/83 mmHg  Pulse 41  Temp(Src) 100.8 F (38.2 C) (Core (Comment))  Resp 20  Ht  (1.651 m)  Wt 69.5 kg (153 lb 3.5 oz)  BMI 25.50 kg/m2  SpO2 100%  LMP  (LMP Unknown)  HEMODYNAMICS:    VENTILATOR SETTINGS: Vent Mode:  [-] PRVC FiO2 (%):  [30 %-40 %] 40 % Set Rate:  [16 bmp] 16 bmp Vt Set:  [500 mL] 500 mL PEEP:  [5 cmH20] 5 cmH20 Pressure Support:  [5 cmH20] 5 cmH20 Plateau Pressure:  [14 cmH20] 14 cmH20  INTAKE / OUTPUT: I/O last 3 completed shifts: In: 7179.5 [I.V.:5429.5; NG/GT:1750] Out: 6708 [Urine:6630; Drains:78]  PHYSICAL EXAMINATION: General: Off sedation, comatose. Neuro: RASS -2, withdrawing to pain but not following any commands. HEENT: Pupils pinpoint. Cardiovascular: regular, no murmur. Lungs: scattered rhonchi Abdomen: soft, non tender Musculoskeletal: no edema Skin: no rashes  LABS:  BMET  Recent Labs Lab 01/17/15 1802 01/18/15 0600 01/19/15 0530  NA 144 145 148*  K 3.8 3.6 3.3*  CL 105 105 111  CO2 BUN 22* 26* 29*  CREATININE 0.69 0.75 0.63  GLUCOSE 170* 216* 148*   Electrolytes  Recent Labs Lab 01/13/15 1328  01/17/15 0451 01/17/15 1802 01/18/15 0600 01/19/15 0530  CALCIUM  --   < > 9.6 9.9 10.2 9.6  MG 1.7  --  2.0  --   --  2.1  PHOS 2.3*  --   --   --   --  3.4  < > = values in this interval not displayed.  CBC  Recent Labs Lab 01/17/15 0451 01/18/15 0600 01/19/15 0530  WBC 31.9* 23.3* 35.4*  HGB 12.4 14.5 14.4  HCT 38.3 44.0 44.5  PLT 181 209 242   Coag's  Recent Labs Lab 01/13/15 1100  APTT 28  INR 1.07   Sepsis Markers  Recent Labs Lab 01/13/15 1123 01/13/15 1500 01/16/15 0600  LATICACIDVEN 7.95* 5.5* 2.0   ABG  Recent Labs Lab  01/13/15 1204 01/13/15 1826 01/19/15 0405  PHART 7.152* 7.367 7.439  PCO2ART 59.8* 34.9* 38.3  PO2ART 82.0 122.0* 107*    Liver Enzymes  Recent Labs Lab 01/13/15 1100  AST 33  ALT 26  ALKPHOS 104  BILITOT 0.6  ALBUMIN 4.0   Cardiac Enzymes  Recent Labs Lab 01/13/15 1328 01/13/15 2010 01/14/15 0030  TROPONINI 1.51* 2.29* 2.19*   Glucose  Recent Labs Lab 01/18/15 1227 01/18/15 1556 01/18/15 1938 01/18/15 2329 01/19/15 0358 01/19/15 0846  GLUCAP 176* 165* 147* 198* 138* 166*   Imaging Dg Chest Port 1 View  01/19/2015  CLINICAL DATA:  Acute respiratory failure, subarachnoid hemorrhage. EXAM: PORTABLE CHEST 1 VIEW COMPARISON:  Portable chest x-ray of January 17, 2015 FINDINGS: The lungs are adequately inflated. There is subsegmental atelectasis in the retrocardiac region medially on the left and in the right infrahilar region. There is no significant pleural effusion. There is no pneumothorax. The cardiac silhouette is mildly enlarged. The pulmonary vascularity is not engorged. The endotracheal tube tip lies 2.6 cm above the carina. The esophagogastric tube tip projects below the inferior margin of the image. The PICC line tip projects over the midportion of the SVC. IMPRESSION: Mild bibasilar subsegmental atelectasis or infiltrate.  Stable cardiomegaly without pulmonary edema. The support tubes are in reasonable position. Electronically Signed   By: David  SwazilandJordan M.D.   On: 01/19/2015 07:38    STUDIES[facility]  [facility] Echo >> severe LVH, EF 60 to 65%, grade 1 diastolic dysfx  CULTURES: Blood 12/21>>> Urine 12/21>>> Sputum 12/21>>> C diff 12/21>>>  ANTIBIOTICS: 12/15 Cefazolin >> 12/17 Vanc 12/21>>> Zosyn 12/21>>>  SIGNIFICANT EVENTS: 12/15 Admit, SAH >> coiling distal ACA aneurysm, IVC drain placed  LINES/TUBES: 12/15 ETT >> 12/15 Rt IJ CVL >> 12/15 IVC >>  12/18 R PICC >>   DISCUSSION:   49 yo female smoker presented with altered mental  status, Rt pupil dilation from HTN emergency (BP 245/194) and SAH.  ASSESSMENT / PLAN:  NEUROLOGIC A:   [facility]. Possible seizure prior to admission. At risk vasospasm, note new decrease in MS 12/19 P:   IVC drain, decadron, AEDs per neurology NS at 150 ml/hr Continue neo for target SBP of 200-220 per neuro's recommendations. RASS goal 0.  PULMONARY A: Compromise airway 2nd to acute encephalopathy. Tobacco abuse. P:   Pressure support wean as tolerated > extubation trial when mental status better but not until then Need to have a conversation with familiy regarding plan of care. Might be candidate for early trach if family wishes to continue aggressive care F/u CXR  CARDIOVASCULAR A:  HTN emergency. Elevated troponin - demand ischemia. P:  Nicardipine d/c'd 12/19 in setting of new weakness and obtundation. New goal SBP 200 Neo to SBP of 200-220. Defer cardiology assessment for now  RENAL A:   Lactic acidosis >> resolved. Hypokalemia. P:   Replace electrolytes. F/u BMET. Keep foley in for now >> need to monitor I/O closely during critical illness.  GASTROINTESTINAL A:   Nutrition. P:   Tube feeds while on vent Protonix for SUP  HEMATOLOGIC A:   Mild anemia, thrombocytopenia of critical illness P:  F/u CBC SCDs for DVT prevention  INFECTIOUS A:   Post-IVC placement prophylaxis >> completed 12/17. Febrile overnight. P:   Pan culture. Vanc/zosyn as above. C. Diff testing.  ENDOCRINE A:   Hyperglycemia. P:   SSI  Will need discussion with family regarding trach/peg vs withdrawal.  No family bedside.  The patient is critically ill with multiple organ systems failure and requires high complexity decision making for assessment and support, frequent evaluation and titration of therapies, application of advanced monitoring technologies and extensive interpretation of multiple databases.   Critical Care Time devoted  to patient care services described in this note is  35  Minutes. This time reflects time of care of this signee Dr Koren BoundWesam Kayce Chismar. This critical care time does not reflect procedure time, or teaching time or supervisory time of PA/NP/Med student/Med Resident etc but could involve care discussion time.  Rachel Vang, M.D. [facility] Pulmonary/Critical Care Medicine. Pager: [phone]. After hours pager: [phone].

## 2015-01-19 NOTE — Progress Notes (Signed)
Pt's foley has not been draining well into chamber despite irrigation, milking and bladder scanning pt. Pt's foley leaked. MD made aware and order received to replace foley catheter. Foley d/c'd and RN noted tip occluded, pt had incontinent episode, then cleaned and new foley placed with sterile technique and two staff present during placement of new temp foley.

## 2015-01-19 NOTE — Progress Notes (Signed)
Pt seen and examined. No issues overnight. Remains intubated  EXAM: Temp:  [99.5 F (37.5 C)-101.3 F (38.5 C)] 101.3 F (38.5 C) (12/21 0600) Pulse Rate:  [25-140] 63 (12/21 0727) Resp:  [14-44] 16 (12/21 0727) BP: (149-263)/(77-150) 205/90 mmHg (12/21 0727) SpO2:  [94 %-100 %] 99 % (12/21 0727) FiO2 (%):  [30 %-40 %] 40 % (12/21 0727) Weight:  [69.5 kg (153 lb 3.5 oz)] 69.5 kg (153 lb 3.5 oz) (12/21 0332) Intake/Output      12/20 0701 - 12/21 0700 12/21 0701 - 12/22 0700   I.V. (mL/kg) 3826 (55.1)    Other     NG/GT 1200    Total Intake(mL/kg) 5026 (72.3)    Urine (mL/kg/hr) 3690 (2.2)    Drains 50 (0)    Total Output 3740     Net +1286           Opens eyes spontaneously Blinks to threat Left gaze preference Withdraws all extremities, L>R  LABS: Lab Results  Component Value Date   CREATININE 0.63 01/19/2015   BUN 29* 01/19/2015   NA 148* 01/19/2015   K 3.3* 01/19/2015   CL 111 01/19/2015   CO2 28 01/19/2015   Lab Results  Component Value Date   WBC 35.4* 01/19/2015   HGB 14.4 01/19/2015   HCT 44.5 01/19/2015   MCV 96.5 01/19/2015   PLT 242 01/19/2015   TCD reviewed, velocities WNL  IMPRESSION: - 49 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 Fever could be central but cant r/o infectious etiology  PLAN: - Will continue hyperdynamic therapy - Nimotop/Zocor - Vent mgmt per PCCM - Will send pan cultures

## 2015-01-19 NOTE — Progress Notes (Signed)
Nutrition Follow-up  INTERVENTION:   D/C Vital High Protein  Vital AF 1.2 @ 60 ml/hr Provides: 1728 kcal, 108 grams protein, and 1167 ml H2O.    NUTRITION DIAGNOSIS:   Inadequate oral intake related to inability to eat as evidenced by NPO status. Ongoing.   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   Patient is currently intubated on ventilator support. OG tube in place MV: 9.8 L/min Temp (24hrs), Avg:100.5 F (38.1 C), Min:99.5 F (37.5 C), Max:101.3 F (38.5 C)  Fever and diarrhea overnight, c.diff pending.  Per MD will need to consider trach/PEG or comfort Propofol off Medications reviewed and include: KCl Labs reviewed: potassium low (3.3), sodium elevated (148), magnesium and phosphorus are WNL.   Diet Order:  Diet NPO time specified  Skin:  Reviewed, no issues  Last BM:  12/21 - loose and watery, rectal pouch inserted 12/21  Height:   Ht Readings from Last 1 Encounters:  01/18/15 5' 5"  (1.651 m)   Weight:   Wt Readings from Last 1 Encounters:  01/19/15 153 lb 3.5 oz (69.5 kg)   Ideal Body Weight:  56.8 kg  BMI:  Body mass index is 25.5 kg/(m^2).  Estimated Nutritional Needs:   Kcal:  7944  Protein:  90-110 grams  Fluid:  > 1.8 L/day  EDUCATION NEEDS:   No education needs identified at this time  [place], [place], [place] Pager [phone] After Hours Pager

## 2015-01-19 NOTE — Progress Notes (Signed)
Pt temperature trending up. Pt's skin feels cool despite higher foley temperature.Ice packs placed on pt.  Elink made aware. No new orders at this time. Will continue to monitor.

## 2015-01-19 NOTE — Progress Notes (Signed)
ANTIBIOTIC CONSULT NOTE - INITIAL  Pharmacy Consult for Vanc/Zosyn Indication: rule out sepsis  Not on File  Patient Measurements: Height: 5\' 5"  (165.1 cm) Weight: 153 lb 3.5 oz (69.5 kg) IBW/kg (Calculated) : 57   Vital Signs: Temp: 100.8 F (38.2 C) (12/21 1015) Temp Source: Core (Comment) (12/21 0915) BP: 180/83 mmHg (12/21 1015) Pulse Rate: 41 (12/21 1015) Intake/Output from previous day: 12/20 0701 - 12/21 0700 In: 5026 [I.V.:3826; NG/GT:1200] Out: 3740 [Urine:3690; Drains:50] Intake/Output from this shift: Total I/O In: 715.6 [I.V.:475.6; NG/GT:240] Out: 609 [Urine:600; Drains:9]  Labs:  Recent Labs  01/17/15 0451 01/17/15 1802 01/18/15 0600 01/19/15 0530  WBC 31.9*  --  23.3* 35.4*  HGB 12.4  --  14.5 14.4  PLT 181  --  209 242  CREATININE 0.76 0.69 0.75 0.63   Estimated Creatinine Clearance: 81.4 mL/min (by C-G formula based on Cr of 0.63). No results for input(s): VANCOTROUGH, VANCOPEAK, VANCORANDOM, GENTTROUGH, GENTPEAK, GENTRANDOM, TOBRATROUGH, TOBRAPEAK, TOBRARND, AMIKACINPEAK, AMIKACINTROU, AMIKACIN in the last 72 hours.   Microbiology: Recent Results (from the past 720 hour(s))  MRSA PCR Screening     Status: None   Collection Time: 01/13/15  4:01 PM  Result Value Ref Range Status   MRSA by PCR NEGATIVE NEGATIVE Final    Comment:        The GeneXpert MRSA Assay (FDA approved for NASAL specimens only), is one component of a comprehensive MRSA colonization surveillance program. It is not intended to diagnose MRSA infection nor to guide or monitor treatment for MRSA infections.     Medical History: Past Medical History  Diagnosis Date  . Hypertension     Medications:  Scheduled:  . antiseptic oral rinse  7 mL Mouth Rinse 10 times per day  . chlorhexidine gluconate  15 mL Mouth Rinse BID  . dexamethasone  4 mg Intravenous 4 times per day  . insulin aspart  0-9 Units Subcutaneous 6 times per day  . levETIRAcetam  500 mg Per Tube BID   . NiMODipine  60 mg Oral Q4H  . pantoprazole sodium  40 mg Per Tube Q24H  . potassium chloride  40 mEq Per Tube TID  . simvastatin  20 mg Per Tube q1800  . sodium chloride  10-40 mL Intracatheter Q12H   Infusions:  . sodium chloride 103 mL/hr at 01/19/15 1000  . feeding supplement (VITAL HIGH PROTEIN) 1,000 mL (01/18/15 2000)  . fentaNYL infusion INTRAVENOUS 25 mcg/hr (01/18/15 2045)  . niCARDipine Stopped (01/17/15 0836)  . phenylephrine (NEO-SYNEPHRINE) Adult infusion 130 mcg/min (01/19/15 1015)  . propofol (DIPRIVAN) infusion Stopped (01/15/15 1745)   PRN: Place/Maintain arterial line **AND** sodium chloride, bisacodyl, fentaNYL, fentaNYL (SUBLIMAZE) injection, sennosides, sodium chloride Assessment: 49 YOF that presented with on 12/15 with AMS, now with fevers overnight and sepsis.  Pharmacy is consulted to dose vancomycin and zosyn.   WBC 25.8>16 >21.3>31.9>23.3>35.4, T 101.8  Vanc 12/21 >> Zosyn 12/21 >> Ancef 12/15 > 12/17  Goal of Therapy:  Vancomycin trough level 15-20 mcg/ml  Plan:  Zosyn 3.375 g IV q8h Vanc 750 mg q8h Measure antibiotic drug levels at steady state Follow up culture results, s/sx of worsening infection  Rachel Vang 01/19/2015,10:54 AM

## 2015-01-20 ENCOUNTER — Inpatient Hospital Stay (HOSPITAL_COMMUNITY): Payer: Medicaid Other

## 2015-01-20 LAB — GLUCOSE, CAPILLARY
GLUCOSE-CAPILLARY: 168 mg/dL — AB (ref 65–99)
GLUCOSE-CAPILLARY: 265 mg/dL — AB (ref 65–99)
GLUCOSE-CAPILLARY: 205 mg/dL — AB (ref 65–99)
GLUCOSE-CAPILLARY: 230 mg/dL — AB (ref 65–99)
Glucose-Capillary: 178 mg/dL — ABNORMAL HIGH (ref 65–99)

## 2015-01-20 LAB — BLOOD GAS, ARTERIAL
ACID-BASE DEFICIT: 0.1 mmol/L (ref 0.0–2.0)
Bicarbonate: 23.8 mEq/L (ref 20.0–24.0)
DRAWN BY: 312761
FIO2: 0.4
MECHVT: 500 mL
O2 Saturation: 98.5 %
PEEP/CPAP: 5 cmH2O
PO2 ART: 148 mmHg — AB (ref 80.0–100.0)
Patient temperature: 102.2
RATE: 16 resp/min
TCO2: 24.9 mmol/L (ref 0–100)
pCO2 arterial: 40.8 mmHg (ref 35.0–45.0)
pH, Arterial: 7.394 (ref 7.350–7.450)

## 2015-01-20 LAB — CBC
HCT: 45.7 % (ref 36.0–46.0)
Hemoglobin: 14.9 g/dL (ref 12.0–15.0)
MCH: 31.9 pg (ref 26.0–34.0)
MCHC: 32.6 g/dL (ref 30.0–36.0)
MCV: 97.9 fL (ref 78.0–100.0)
PLATELETS: 222 10*3/uL (ref 150–400)
RBC: 4.67 MIL/uL (ref 3.87–5.11)
RDW: 14.9 % (ref 11.5–15.5)
WBC: 32.6 10*3/uL — AB (ref 4.0–10.5)

## 2015-01-20 LAB — BASIC METABOLIC PANEL
ANION GAP: 13 (ref 5–15)
BUN: 28 mg/dL — ABNORMAL HIGH (ref 6–20)
CALCIUM: 9.4 mg/dL (ref 8.9–10.3)
CO2: 23 mmol/L (ref 22–32)
CREATININE: 0.79 mg/dL (ref 0.44–1.00)
Chloride: 108 mmol/L (ref 101–111)
Glucose, Bld: 295 mg/dL — ABNORMAL HIGH (ref 65–99)
Potassium: 3.3 mmol/L — ABNORMAL LOW (ref 3.5–5.1)
SODIUM: 144 mmol/L (ref 135–145)

## 2015-01-20 LAB — PHOSPHORUS: PHOSPHORUS: 3.4 mg/dL (ref 2.5–4.6)

## 2015-01-20 LAB — MAGNESIUM: MAGNESIUM: 2.1 mg/dL (ref 1.7–2.4)

## 2015-01-20 MED ORDER — LOPERAMIDE HCL 1 MG/5ML PO LIQD
2.0000 mg | ORAL | Status: DC | PRN
  Administered 2015-01-20 – 2015-01-21 (×5): 2 mg via ORAL
  Filled 2015-01-20 (×6): qty 10

## 2015-01-20 MED ORDER — ACETAMINOPHEN 160 MG/5ML PO SOLN
650.0000 mg | ORAL | Status: DC | PRN
  Administered 2015-01-20 – 2015-02-08 (×15): 650 mg
  Filled 2015-01-20 (×18): qty 20.3

## 2015-01-20 MED ORDER — POTASSIUM CHLORIDE 20 MEQ/15ML (10%) PO SOLN
40.0000 meq | Freq: Three times a day (TID) | ORAL | Status: AC
  Administered 2015-01-20 (×2): 40 meq
  Filled 2015-01-20 (×2): qty 30

## 2015-01-20 NOTE — Progress Notes (Signed)
Discussed blood culture results with Dr. Molli KnockYacoub. (G+ cocci in clusters in aerobic bottle).

## 2015-01-20 NOTE — Progress Notes (Addendum)
Pt temps climbing, despite tylenol and cool bath and ice packs and fan. Discussed with Dr. Conchita ParisNundkumar. And e link MD.

## 2015-01-20 NOTE — Progress Notes (Signed)
Pt seen and examined. No issues overnight. Remains unchanged, intubated  EXAM: Temp:  [97.8 F (36.6 C)-102.7 F (39.3 C)] 101.7 F (38.7 C) (12/22 0900) Pulse Rate:  [25-133] 96 (12/22 0900) Resp:  [15-86] 23 (12/22 0900) BP: (129-234)/(78-177) 173/103 mmHg (12/22 0900) SpO2:  [94 %-100 %] 100 % (12/22 0900) FiO2 (%):  [40 %] 40 % (12/22 0724) Weight:  [69.6 kg (153 lb 7 oz)] 69.6 kg (153 lb 7 oz) (12/22 0425) Intake/Output      12/21 0701 - 12/22 0700 12/22 0701 - 12/23 0700   I.V. (mL/kg) 3088.2 (44.4) 135 (1.9)   NG/GT 1524 120   IV Piggyback 537.5    Total Intake(mL/kg) 5149.7 (74) 255 (3.7)   Urine (mL/kg/hr) 2440 (1.5) 250 (1.2)   Drains 45 (0) 3 (0)   Stool 1295 (0.8)    Total Output 3780 253   Net +1369.7 +2        Urine Occurrence 1 x    Stool Occurrence 1 x      Eyes open spontaneously Blinks to threat W/D RUE/RLE > Left side Breathes over vent  LABS: Lab Results  Component Value Date   CREATININE 0.79 01/20/2015   BUN 28* 01/20/2015   NA 144 01/20/2015   K 3.3* 01/20/2015   CL 108 01/20/2015   CO2 23 01/20/2015   Lab Results  Component Value Date   WBC 32.6* 01/20/2015   HGB 14.9 01/20/2015   HCT 45.7 01/20/2015   MCV 97.9 01/20/2015   PLT 222 01/20/2015    IMPRESSION: - 49 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 Had asymmetric exam a few days ago which improved somewhat with initiation of hyperdynamic treatment - Fever, on empiric atiobiotics - VDRF  PLAN: - Cont current hyperdymanic therapy, nimotop/zocor - Will need to discuss with family regarding trach/PEG

## 2015-01-20 NOTE — Progress Notes (Signed)
~  [place] Nurse: E-LINK RN rounding. Pointed out pt has very red hands bilaterally and up to forearms. No other redness noted at time. Noted black raised area to left thumb as well a dark appearing areas to posterior wrist area. Discussed antibiotics/ pressor. Will pass along to MD. ~2015: Dr. Isaiah SergeMannam called this RN. Discussed redness of bilateral hands, and forearms, medications. Instructed to continue to monitor. Also pointed out frequency of bigeminy and trigeminy, previous labs and replacement. Will continue to monitor.

## 2015-01-20 NOTE — Progress Notes (Signed)
PULMONARY / CRITICAL CARE MEDICINE   Name: Rachel Vang MRN: [id number] DOB: 09/24/1965    ADMISSION DATE:  01/13/2015  REFERRING MD:  EDP  CHIEF COMPLAINT:  AMS  SUBJECTIVE:  Significant diarrhea overnight, c. Diff negative.   VITAL SIGNS: BP 173/103 mmHg  Pulse 96  Temp(Src) 101.7 F (38.7 C) (Rectal)  Resp 23  Ht 5\' 5"  (1.651 m)  Wt 69.6 kg (153 lb 7 oz)  BMI 25.53 kg/m2  SpO2 100%  LMP  (LMP Unknown)  HEMODYNAMICS:    VENTILATOR SETTINGS: Vent Mode:  [-] PRVC FiO2 (%):  [40 %] 40 % Set Rate:  [16 bmp] 16 bmp Vt Set:  [500 mL] 500 mL PEEP:  [5 cmH20] 5 cmH20 Plateau Pressure:  [14 cmH20-22 cmH20] 16 cmH20  INTAKE / OUTPUT: I/O last 3 completed shifts: In: 7424 [I.V.:4812.5; NG/GT:2074; IV Piggyback:537.5] Out: 5504 [Urine:4140; Drains:69; Stool:1295]  PHYSICAL EXAMINATION: General: Off sedation, comatose, opens eyes but not purposeful. Neuro: RASS -2, withdrawing to pain but not following any commands. HEENT: Pupils pinpoint. Cardiovascular: regular, no murmur. Lungs: scattered rhonchi Abdomen: soft, non tender Musculoskeletal: no edema Skin: no rashes  LABS:  BMET  Recent Labs Lab 01/18/15 0600 01/19/15 0530 01/20/15 0450  NA 145 148* 144  K 3.6 3.3* 3.3*  CL 105 111 108  CO2 27 28 23   BUN 26* 29* 28*  CREATININE 0.75 0.63 0.79  GLUCOSE 216* 148* 295*   Electrolytes  Recent Labs Lab 01/13/15 1328  01/17/15 0451  01/18/15 0600 01/19/15 0530 01/20/15 0450  CALCIUM  --   < > 9.6  < > 10.2 9.6 9.4  MG 1.7  --  2.0  --   --  2.1 2.1  PHOS 2.3*  --   --   --   --  3.4 3.4  < > = values in this interval not displayed.  CBC  Recent Labs Lab 01/18/15 0600 01/19/15 0530 01/20/15 0450  WBC 23.3* 35.4* 32.6*  HGB 14.5 14.4 14.9  HCT 44.0 44.5 45.7  PLT 209 242 222   Coag's  Recent Labs Lab 01/13/15 1100  APTT 28  INR 1.07   Sepsis Markers  Recent Labs Lab 01/13/15 1123 01/13/15 1500 01/16/15 0600  LATICACIDVEN  7.95* 5.5* 2.0   ABG  Recent Labs Lab 01/13/15 1826 01/19/15 0405 01/20/15 0325  PHART 7.367 7.439 7.394  PCO2ART 34.9* 38.3 40.8  PO2ART 122.0* 107* 148*    Liver Enzymes  Recent Labs Lab 01/13/15 1100  AST 33  ALT 26  ALKPHOS 104  BILITOT 0.6  ALBUMIN 4.0   Cardiac Enzymes  Recent Labs Lab 01/13/15 1328 01/13/15 2010 01/14/15 0030  TROPONINI 1.51* 2.29* 2.19*   Glucose  Recent Labs Lab 01/19/15 1608 01/19/15 1759 01/19/15 1947 01/19/15 2334 01/20/15 0326 01/20/15 0804  GLUCAP 173* 164* 163* 174* 168* 178*   Imaging Dg Chest Port 1 View  01/20/2015  CLINICAL DATA:  Intubated. EXAM: PORTABLE CHEST 1 VIEW COMPARISON:  01/19/2015 FINDINGS: The endotracheal tube lies 1 cm above the carina. Right PICC terminates over the cavoatrial junction. Cardiac silhouette remains mildly enlarged. Enteric tube courses into the left upper abdomen with tip not imaged. There is improved aeration of the right lung base. Retrocardiac opacity in the left lung base is similar to the prior study. No sizable pleural effusion or pneumothorax is identified. IMPRESSION: 1. Endotracheal tube 1 cm above the carina. Suggest retracting slightly. 2. Improved aeration of the right lung base. Unchanged left basilar atelectasis.  Electronically Signed   By: Sebastian Ache M.D.   On: 01/20/2015 07:51    STUDIES:  12/15 CT head >> [facility] 12/16 Echo >> severe LVH, EF 60 to 65%, grade 1 diastolic dysfx  CULTURES: Blood 12/21>>> Urine 12/21>>> Sputum 12/21>>> C diff 12/21>>>negative  ANTIBIOTICS: 12/15 Cefazolin >> 12/17 Vanc 12/21>>> Zosyn 12/21>>>  SIGNIFICANT EVENTS: 12/15 Admit, SAH >> coiling distal ACA aneurysm, IVC drain placed  LINES/TUBES: 12/15 ETT >> 12/15 Rt IJ CVL >> 12/15 IVC >>  12/18 R PICC >>   DISCUSSION:   49 yo female smoker presented with altered mental status, Rt pupil dilation from HTN emergency (BP 245/194) and SAH.  ASSESSMENT / PLAN:  NEUROLOGIC A:   Acute  encephalopathy 2nd to [facility], s/p coiling. Possible seizure prior to admission. At risk vasospasm, note new decrease in MS 12/19 P:   IVC drain, decadron, AEDs per neurology NS at 150 ml/hr Continue neo for target SBP of 200-220 per neuro's recommendations. RASS goal 0.  PULMONARY A: Compromise airway 2nd to acute encephalopathy. Tobacco abuse. P:   Failed PS trials due to tachypnea. Need to have a conversation with familiy regarding plan of care but neuro informed them we need a couple of days prior to serious discussion, so likely not til next week. Might be candidate for early trach if family wishes to continue aggressive care F/u CXR F/U ABG.  CARDIOVASCULAR A:  HTN emergency. Elevated troponin - demand ischemia. P:  Nicardipine d/c'd 12/19 in setting of new weakness and obtundation. New goal SBP 200 Neo to SBP of 200-220. Defer cardiology assessment for now  RENAL A:   Lactic acidosis >> resolved. Hypokalemia. P:   Replace electrolytes. F/u BMET. Keep foley in for now >> need to monitor I/O closely during critical illness.  GASTROINTESTINAL A:   Nutrition. P:   Tube feeds while on vent Protonix for SUP  HEMATOLOGIC A:   Mild anemia, thrombocytopenia of critical illness P:  F/u CBC SCDs for DVT prevention  INFECTIOUS A:   Post-IVC placement prophylaxis >> completed 12/17. Febrile overnight. P:   Pan culture. Vanc/zosyn as above. C. Diff negative.  ENDOCRINE A:   Hyperglycemia. P:   SSI  Will need discussion with family regarding trach/peg vs withdrawal.  No family bedside.  The patient is critically ill with multiple organ systems failure and requires high complexity decision making for assessment and support, frequent evaluation and titration of therapies, application of advanced monitoring technologies and extensive interpretation of multiple databases.   Critical Care Time devoted to patient care services described in this note is  35   Minutes. This time reflects time of care of this signee Dr Koren Bound. This critical care time does not reflect procedure time, or teaching time or supervisory time of PA/NP/Med student/Med Resident etc but could involve care discussion time.  Alyson Reedy, M.D. [facility] Pulmonary/Critical Care Medicine. Pager: [phone]. After hours pager: [phone].

## 2015-01-21 ENCOUNTER — Inpatient Hospital Stay (HOSPITAL_COMMUNITY): Payer: Medicaid Other

## 2015-01-21 DIAGNOSIS — E876 Hypokalemia: Secondary | ICD-10-CM

## 2015-01-21 DIAGNOSIS — I609 Nontraumatic subarachnoid hemorrhage, unspecified: Secondary | ICD-10-CM

## 2015-01-21 LAB — CBC
HCT: 42.6 % (ref 36.0–46.0)
HEMOGLOBIN: 13.9 g/dL (ref 12.0–15.0)
MCH: 31.6 pg (ref 26.0–34.0)
MCHC: 32.6 g/dL (ref 30.0–36.0)
MCV: 96.8 fL (ref 78.0–100.0)
PLATELETS: 225 10*3/uL (ref 150–400)
RBC: 4.4 MIL/uL (ref 3.87–5.11)
RDW: 14.7 % (ref 11.5–15.5)
WBC: 28.2 10*3/uL — ABNORMAL HIGH (ref 4.0–10.5)

## 2015-01-21 LAB — BASIC METABOLIC PANEL
Anion gap: 9 (ref 5–15)
BUN: 21 mg/dL — AB (ref 6–20)
CALCIUM: 8.5 mg/dL — AB (ref 8.9–10.3)
CHLORIDE: 109 mmol/L (ref 101–111)
CO2: 24 mmol/L (ref 22–32)
CREATININE: 0.57 mg/dL (ref 0.44–1.00)
Glucose, Bld: 271 mg/dL — ABNORMAL HIGH (ref 65–99)
Potassium: 3.4 mmol/L — ABNORMAL LOW (ref 3.5–5.1)
SODIUM: 142 mmol/L (ref 135–145)

## 2015-01-21 LAB — PHOSPHORUS: PHOSPHORUS: 2.7 mg/dL (ref 2.5–4.6)

## 2015-01-21 LAB — BLOOD GAS, ARTERIAL
Acid-base deficit: 1.9 mmol/L (ref 0.0–2.0)
Bicarbonate: 21.8 mEq/L (ref 20.0–24.0)
DRAWN BY: 418751
FIO2: 0.4
LHR: 16 {breaths}/min
MECHVT: 500 mL
O2 Saturation: 98.7 %
PATIENT TEMPERATURE: 100.4
PCO2 ART: 35.1 mmHg (ref 35.0–45.0)
PEEP: 5 cmH2O
PO2 ART: 137 mmHg — AB (ref 80.0–100.0)
TCO2: 22.8 mmol/L (ref 0–100)
pH, Arterial: 7.415 (ref 7.350–7.450)

## 2015-01-21 LAB — CULTURE, RESPIRATORY

## 2015-01-21 LAB — GLUCOSE, CAPILLARY
GLUCOSE-CAPILLARY: 219 mg/dL — AB (ref 65–99)
GLUCOSE-CAPILLARY: 243 mg/dL — AB (ref 65–99)
GLUCOSE-CAPILLARY: 273 mg/dL — AB (ref 65–99)
Glucose-Capillary: 268 mg/dL — ABNORMAL HIGH (ref 65–99)
Glucose-Capillary: 241 mg/dL — ABNORMAL HIGH (ref 65–99)
Glucose-Capillary: 262 mg/dL — ABNORMAL HIGH (ref 65–99)

## 2015-01-21 LAB — MAGNESIUM: MAGNESIUM: 1.8 mg/dL (ref 1.7–2.4)

## 2015-01-21 MED ORDER — POTASSIUM PHOSPHATES 15 MMOLE/5ML IV SOLN
30.0000 mmol | Freq: Once | INTRAVENOUS | Status: AC
  Administered 2015-01-21: 30 mmol via INTRAVENOUS
  Filled 2015-01-21: qty 10

## 2015-01-21 MED ORDER — INSULIN GLARGINE 100 UNIT/ML ~~LOC~~ SOLN
5.0000 [IU] | Freq: Two times a day (BID) | SUBCUTANEOUS | Status: DC
  Administered 2015-01-21 (×2): 5 [IU] via SUBCUTANEOUS
  Filled 2015-01-21 (×4): qty 0.05

## 2015-01-21 MED ORDER — MAGNESIUM SULFATE 2 GM/50ML IV SOLN
2.0000 g | Freq: Once | INTRAVENOUS | Status: AC
  Administered 2015-01-21: 2 g via INTRAVENOUS
  Filled 2015-01-21: qty 50

## 2015-01-21 NOTE — Progress Notes (Signed)
Transcranial Doppler  Date POD PCO2 HCT BP  MCA ACA PCA OPHT SIPH VERT Basilar  01/15/15 JE     Right  Left   72  105   -47  -61   29  43   16  17   -54     -32     -32 ?      01-17-15 hc     Right  Left   65  53   -67  -61   30  33   38  25     24     -47     -44    01/19/15 MS     Right  Left   72  81   -45  -77   32  40   24  31   33  *   *  *   *      01/21/15 MS      Right  Left   56  108   -48  -37   61  -21   19  19    *  *   -17  -38   *            Right  Left                                            Right  Left                                            Right  Left                                        MCA = Middle Cerebral Artery      OPHT = Opthalmic Artery     BASILAR = Basilar Artery   ACA = Anterior Cerebral Artery     SIPH = Carotid Siphon PCA = Posterior Cerebral Artery   VERT = Verterbral Artery                   Normal MCA = 62+\-12 ACA = 50+\-12 PCA = 42+\-23   01/15/15 Unable to insonate left siphon (pt was becoming agitated at this point). Unable to attempt left vertebral due to left neck line. Unsure if basilar mean velocity is accurate due to poor window/ positioning. JE  01/19/15- * Unable to insonate due to poor patient cooperation and poor acoustic window.  01/21/15- * Unable to insonate due to poor patient cooperation.  01/21/2015 3:04 PM Gertie FeyMichelle Brodi Nery, RVT, RDCS, RDMS

## 2015-01-21 NOTE — Progress Notes (Signed)
abg collected per Miriam, RRT  

## 2015-01-21 NOTE — Progress Notes (Signed)
Pt seen and examined. No issues overnight, is requiring more pressors this am  EXAM: Temp:  [100.6 F (38.1 C)-103.8 F (39.9 C)] 100.9 F (38.3 C) (12/23 1100) Pulse Rate:  [34-151] 61 (12/23 1100) Resp:  [16-42] 18 (12/23 1100) BP: (121-249)/(75-165) 214/112 mmHg (12/23 1100) SpO2:  [77 %-100 %] 100 % (12/23 1100) FiO2 (%):  [40 %] 40 % (12/23 0715) Weight:  [73.7 kg (162 lb 7.7 oz)] 73.7 kg (162 lb 7.7 oz) (12/23 0600) Intake/Output      12/22 0701 - 12/23 0700 12/23 0701 - 12/24 0700   I.V. (mL/kg) 1944.7 (26.4)    Other 725    NG/GT 960    IV Piggyback 600    Total Intake(mL/kg) 4229.7 (57.4)    Urine (mL/kg/hr) 2075 (1.2) 575 (1.6)   Drains 52 (0) 10 (0)   Stool 900 (0.5)    Total Output 3027 585   Net +1202.7 -585          Opens eyes spontaneously Breathes over vent Left gaze preference Blinks to threat W/D BUE, BLE, L>R  LABS: Lab Results  Component Value Date   CREATININE 0.57 01/21/2015   BUN 21* 01/21/2015   NA 142 01/21/2015   K 3.4* 01/21/2015   CL 109 01/21/2015   CO2 24 01/21/2015   Lab Results  Component Value Date   WBC 28.2* 01/21/2015   HGB 13.9 01/21/2015   HCT 42.6 01/21/2015   MCV 96.8 01/21/2015   PLT 225 01/21/2015    IMPRESSION: - 49 y.o. female SAH d# 8 presenting as H&H 5, coiled distal ACA aneurysm. Remains minimally conscious. - ?SIRS v Sepsis. Has 1 blood culture with (+) Gram stain - VDRF - Remains on hyperdynamic therapy  PLAN: - Cont with goal SBP 200-24820mmHg, pressors PRN - Vent mgmt per PCCM - Cont empiric abx  I did discuss the situation with the patients family this am. At this point she has had no significant improvement in mental status. We will have to decide if we will proceed with trach/PEG or take a palliative approach. They will discuss among other family members. All questions were answered.

## 2015-01-21 NOTE — Care Management (Signed)
UR updated . Missael Ferrari RN BSN [phone]  

## 2015-01-21 NOTE — Progress Notes (Signed)
PULMONARY / CRITICAL CARE MEDICINE   Name: Rachel MinksStephanie N Vang MRN: [id number] DOB: 03/06/1965    ADMISSION DATE:  01/13/2015  REFERRING MD:  EDP  CHIEF COMPLAINT:  AMS  SUBJECTIVE:  Becoming more hypotensive this AM, requiring more pressors.Marland Kitchen.   VITAL SIGNS: BP 222/115 mmHg  Pulse 53  Temp(Src) 100.6 F (38.1 C) (Core (Comment))  Resp 19  Ht 5\' 5"  (1.651 m)  Wt 73.7 kg (162 lb 7.7 oz)  BMI 27.04 kg/m2  SpO2 100%  LMP  (LMP Unknown)  HEMODYNAMICS:    VENTILATOR SETTINGS: Vent Mode:  [-] PRVC FiO2 (%):  [40 %] 40 % Set Rate:  [16 bmp] 16 bmp Vt Set:  [500 mL] 500 mL PEEP:  [5 cmH20] 5 cmH20 Plateau Pressure:  [11 cmH20-16 cmH20] 11 cmH20  INTAKE / OUTPUT: I/O last 3 completed shifts: In: 6963.9 [I.V.:3571.4; Other:725; NG/GT:1680; IV Piggyback:987.5] Out: 5379 [Urine:3185; Drains:69; Stool:2125]  PHYSICAL EXAMINATION: General: Off sedation, comatose, opens eyes but not purposeful. Neuro: RASS -2, withdrawing to pain but not following any commands. HEENT: Pupils pinpoint. Cardiovascular: regular, no murmur. Lungs: scattered rhonchi Abdomen: soft, non tender Musculoskeletal: no edema Skin: no rashes  LABS:  BMET  Recent Labs Lab 01/19/15 0530 01/20/15 0450 01/21/15 0621  NA 148* 144 142  K 3.3* 3.3* 3.4*  CL 111 108 109  CO2 28 23 24   BUN 29* 28* 21*  CREATININE 0.63 0.79 0.57  GLUCOSE 148* 295* 271*   Electrolytes  Recent Labs Lab 01/19/15 0530 01/20/15 0450 01/21/15 0621  CALCIUM 9.6 9.4 8.5*  MG 2.1 2.1 1.8  PHOS 3.4 3.4 2.7   CBC  Recent Labs Lab 01/19/15 0530 01/20/15 0450 01/21/15 0621  WBC 35.4* 32.6* 28.2*  HGB 14.4 14.9 13.9  HCT 44.5 45.7 42.6  PLT 242 222 225   Coag's No results for input(s): APTT, INR in the last 168 hours. Sepsis Markers  Recent Labs Lab 01/16/15 0600  LATICACIDVEN 2.0   ABG  Recent Labs Lab 01/19/15 0405 01/20/15 0325 01/21/15 0430  PHART 7.439 7.394 7.415  PCO2ART 38.3 40.8 35.1   PO2ART 107* 148* 137*   Liver Enzymes No results for input(s): AST, ALT, ALKPHOS, BILITOT, ALBUMIN in the last 168 hours. Cardiac Enzymes No results for input(s): TROPONINI, PROBNP in the last 168 hours. Glucose  Recent Labs Lab 01/20/15 1144 01/20/15 1529 01/20/15 1941 01/20/15 2352 01/21/15 0334 01/21/15 0736  GLUCAP 265* 205* 230* 219* 268* 241*   Imaging Dg Chest Port 1 View  01/21/2015  CLINICAL DATA:  Evaluate ET tube placement EXAM: PORTABLE CHEST 1 VIEW COMPARISON:  01/20/2015 FINDINGS: Right arm PICC line tip is in the cavoatrial junction. ET tube tip is above the carina. There is a nasogastric tube with tip in the stomach. Moderate cardiac enlargement. There is no pleural effusion or edema. No airspace consolidation. IMPRESSION: 1. ET tube tip in satisfactory position above the carina. 2. Stable cardiac enlargement. Electronically Signed   By: Signa Kellaylor  Stroud M.D.   On: 01/21/2015 07:57    STUDIES:  [facility] Echo >> severe LVH, EF 60 to 65%, grade 1 diastolic dysfx  CULTURES: Blood 12/21>>>GPC Sputum 12/21>>>NTD C diff 12/21>>>negative  ANTIBIOTICS: 12/15 Cefazolin >> 12/17 Vanc 12/21>>> Zosyn 12/21>>>  SIGNIFICANT EVENTS: 12/15 Admit, SAH >> coiling distal ACA aneurysm, IVC drain placed  LINES/TUBES: 12/15 ETT >> 12/15 Rt IJ CVL >> 12/15 IVC >>  12/18 R PICC >>   DISCUSSION:   49 yo female smoker  presented with altered mental status, Rt pupil dilation from HTN emergency (BP 245/194) and SAH.  ASSESSMENT / PLAN:  NEUROLOGIC A:   Acute encephalopathy 2nd to [facility], s/p coiling. Possible seizure prior to admission. At risk vasospasm, note new decrease in MS 12/19 P:   IVC drain, decadron, AEDs per neurology NS at 150 ml/hr Continue neo for target SBP of 200-220 per neuro's recommendations. Check CVP. RASS goal 0.  PULMONARY A: Compromise airway 2nd to acute encephalopathy. Tobacco abuse. P:   PS trials but no extubation  given mental status. Family deciding on trach/peg vs comfort. F/u CXR F/U ABG.  CARDIOVASCULAR A:  HTN emergency. Elevated troponin - demand ischemia. More hypotensive this AM and requiring more pressors. P:  Nicardipine d/c'd 12/19 in setting of new weakness and obtundation. New goal SBP 200 Neo to SBP of 200-220. Defer cardiology assessment for now  RENAL A:   Lactic acidosis >> resolved. Hypokalemia. P:   Replace electrolytes. F/u BMET. Keep foley in for now >> need to monitor I/O closely during critical illness.  GASTROINTESTINAL A:   Nutrition. P:   Tube feeds while on vent Protonix for SUP  HEMATOLOGIC A:   Mild anemia, thrombocytopenia of critical illness P:  F/u CBC SCDs for DVT prevention  INFECTIOUS A:   Post-IVC placement prophylaxis >> completed 12/17. Febrile overnight. P:   Pan culture. Vanc/zosyn as above. C. Diff negative.  ENDOCRINE A:   Hyperglycemia. P:   SSI - increase to regular. Add lantus 5 units sq BID.  NSG updated family, remains full code, they are discussing code status at this point.  The patient is critically ill with multiple organ systems failure and requires high complexity decision making for assessment and support, frequent evaluation and titration of therapies, application of advanced monitoring technologies and extensive interpretation of multiple databases.   Critical Care Time devoted to patient care services described in this note is  35  Minutes. This time reflects time of care of this signee Dr Koren Bound. This critical care time does not reflect procedure time, or teaching time or supervisory time of PA/NP/Med student/Med Resident etc but could involve care discussion time.  Alyson Reedy, M.D. [facility] Pulmonary/Critical Care Medicine. Pager: [phone]. After hours pager: [phone].

## 2015-01-21 NOTE — Progress Notes (Signed)
~  100cc of fentanyl wasted in sink, witnessed by Quitman LivingsEllie, Charity fundraiserN. Wasted d/t expiration date

## 2015-01-21 NOTE — Progress Notes (Signed)
I spoke with the patient's son over the phone. I reviewed the patient's condition and reviewed the options at this point including palliative approach vs trach/PEG for continued care. He indicated that he discussed the options with the rest of the family and they would like to proceed with Trach and PEG. All questions were answered.

## 2015-01-21 NOTE — Progress Notes (Signed)
BP quickly dropped from 200 SBP to 130 SBP. Neo adjusted accordingly, watching BP closely.

## 2015-01-22 ENCOUNTER — Inpatient Hospital Stay (HOSPITAL_COMMUNITY): Payer: Medicaid Other

## 2015-01-22 DIAGNOSIS — I619 Nontraumatic intracerebral hemorrhage, unspecified: Secondary | ICD-10-CM

## 2015-01-22 LAB — POCT I-STAT 3, ART BLOOD GAS (G3+)
Bicarbonate: 24.3 mEq/L — ABNORMAL HIGH (ref 20.0–24.0)
O2 SAT: 99 %
PH ART: 7.396 (ref 7.350–7.450)
PO2 ART: 126 mmHg — AB (ref 80.0–100.0)
TCO2: 26 mmol/L (ref 0–100)
pCO2 arterial: 39.7 mmHg (ref 35.0–45.0)

## 2015-01-22 LAB — BASIC METABOLIC PANEL
Anion gap: 9 (ref 5–15)
BUN: 23 mg/dL — AB (ref 6–20)
CHLORIDE: 103 mmol/L (ref 101–111)
CO2: 29 mmol/L (ref 22–32)
Calcium: 9.4 mg/dL (ref 8.9–10.3)
Creatinine, Ser: 0.62 mg/dL (ref 0.44–1.00)
GFR calc Af Amer: >60 mL/min (ref >60)
GFR calc non Af Amer: >60 mL/min (ref >60)
Glucose, Bld: 194 mg/dL — ABNORMAL HIGH (ref 65–99)
POTASSIUM: 3.9 mmol/L (ref 3.5–5.1)
SODIUM: 141 mmol/L (ref 135–145)

## 2015-01-22 LAB — GLUCOSE, CAPILLARY
GLUCOSE-CAPILLARY: 156 mg/dL — AB (ref 65–99)
GLUCOSE-CAPILLARY: 271 mg/dL — AB (ref 65–99)
GLUCOSE-CAPILLARY: 324 mg/dL — AB (ref 65–99)
Glucose-Capillary: 270 mg/dL — ABNORMAL HIGH (ref 65–99)
Glucose-Capillary: 215 mg/dL — ABNORMAL HIGH (ref 65–99)
Glucose-Capillary: 202 mg/dL — ABNORMAL HIGH (ref 65–99)

## 2015-01-22 LAB — CBC
HCT: 45.9 % (ref 36.0–46.0)
HEMOGLOBIN: 15.1 g/dL — AB (ref 12.0–15.0)
MCH: 31.7 pg (ref 26.0–34.0)
MCHC: 32.9 g/dL (ref 30.0–36.0)
MCV: 96.4 fL (ref 78.0–100.0)
Platelets: 269 10*3/uL (ref 150–400)
RBC: 4.76 MIL/uL (ref 3.87–5.11)
RDW: 14.7 % (ref 11.5–15.5)
WBC: 31.6 10*3/uL — ABNORMAL HIGH (ref 4.0–10.5)

## 2015-01-22 LAB — PHOSPHORUS: Phosphorus: 4.6 mg/dL (ref 2.5–4.6)

## 2015-01-22 LAB — MAGNESIUM: MAGNESIUM: 2.1 mg/dL (ref 1.7–2.4)

## 2015-01-22 MED ORDER — INSULIN ASPART 100 UNIT/ML ~~LOC~~ SOLN: 2.0000 [IU] | SUBCUTANEOUS | Status: DC

## 2015-01-22 MED ORDER — LOPERAMIDE HCL 1 MG/5ML PO LIQD
2.0000 mg | Freq: Every day | ORAL | Status: DC
  Administered 2015-01-22 – 2015-01-31 (×10): 2 mg
  Filled 2015-01-22 (×10): qty 10

## 2015-01-22 MED ORDER — INSULIN GLARGINE 100 UNIT/ML ~~LOC~~ SOLN
10.0000 [IU] | Freq: Two times a day (BID) | SUBCUTANEOUS | Status: DC
  Administered 2015-01-22 – 2015-02-14 (×45): 10 [IU] via SUBCUTANEOUS
  Filled 2015-01-22 (×49): qty 0.1

## 2015-01-22 MED ORDER — INSULIN ASPART 100 UNIT/ML ~~LOC~~ SOLN
0.0000 [IU] | SUBCUTANEOUS | Status: DC
  Administered 2015-01-22: 7 [IU] via SUBCUTANEOUS
  Administered 2015-01-22: 15 [IU] via SUBCUTANEOUS
  Administered 2015-01-22: 7 [IU] via SUBCUTANEOUS
  Administered 2015-01-23: 4 [IU] via SUBCUTANEOUS
  Administered 2015-01-23: 7 [IU] via SUBCUTANEOUS
  Administered 2015-01-23: 11 [IU] via SUBCUTANEOUS
  Administered 2015-01-23 (×4): 7 [IU] via SUBCUTANEOUS
  Administered 2015-01-24 (×2): 4 [IU] via SUBCUTANEOUS
  Administered 2015-01-24: 3 [IU] via SUBCUTANEOUS
  Administered 2015-01-24 (×2): 7 [IU] via SUBCUTANEOUS
  Administered 2015-01-24 – 2015-01-25 (×3): 4 [IU] via SUBCUTANEOUS
  Administered 2015-01-25: 7 [IU] via SUBCUTANEOUS
  Administered 2015-01-25: 4 [IU] via SUBCUTANEOUS
  Administered 2015-01-25: 7 [IU] via SUBCUTANEOUS
  Administered 2015-01-25: 4 [IU] via SUBCUTANEOUS
  Administered 2015-01-26: 7 [IU] via SUBCUTANEOUS
  Administered 2015-01-26: 4 [IU] via SUBCUTANEOUS
  Administered 2015-01-26: 7 [IU] via SUBCUTANEOUS
  Administered 2015-01-26 (×2): 4 [IU] via SUBCUTANEOUS
  Administered 2015-01-26: 7 [IU] via SUBCUTANEOUS
  Administered 2015-01-27 (×3): 4 [IU] via SUBCUTANEOUS
  Administered 2015-01-27 (×2): 3 [IU] via SUBCUTANEOUS
  Administered 2015-01-28: 4 [IU] via SUBCUTANEOUS
  Administered 2015-01-28: 3 [IU] via SUBCUTANEOUS
  Administered 2015-01-28 (×4): 4 [IU] via SUBCUTANEOUS
  Administered 2015-01-29 (×2): 3 [IU] via SUBCUTANEOUS
  Administered 2015-01-29: 4 [IU] via SUBCUTANEOUS
  Administered 2015-01-29 (×2): 3 [IU] via SUBCUTANEOUS
  Administered 2015-01-30: 4 [IU] via SUBCUTANEOUS
  Administered 2015-01-30 (×2): 3 [IU] via SUBCUTANEOUS
  Administered 2015-01-30: 4 [IU] via SUBCUTANEOUS
  Administered 2015-01-30 – 2015-02-01 (×7): 3 [IU] via SUBCUTANEOUS
  Administered 2015-02-01: 4 [IU] via SUBCUTANEOUS
  Administered 2015-02-01: 7 [IU] via SUBCUTANEOUS
  Administered 2015-02-02 – 2015-02-03 (×2): 4 [IU] via SUBCUTANEOUS
  Administered 2015-02-03 (×3): 3 [IU] via SUBCUTANEOUS
  Administered 2015-02-03: 4 [IU] via SUBCUTANEOUS
  Administered 2015-02-04 (×2): 3 [IU] via SUBCUTANEOUS
  Administered 2015-02-05 (×5): 4 [IU] via SUBCUTANEOUS
  Administered 2015-02-05: 3 [IU] via SUBCUTANEOUS
  Administered 2015-02-05 – 2015-02-06 (×2): 4 [IU] via SUBCUTANEOUS
  Administered 2015-02-06: 3 [IU] via SUBCUTANEOUS
  Administered 2015-02-06 (×2): 4 [IU] via SUBCUTANEOUS
  Administered 2015-02-07 – 2015-02-08 (×3): 3 [IU] via SUBCUTANEOUS
  Administered 2015-02-08 (×2): 4 [IU] via SUBCUTANEOUS
  Administered 2015-02-09 (×3): 3 [IU] via SUBCUTANEOUS
  Administered 2015-02-09 (×2): 4 [IU] via SUBCUTANEOUS
  Administered 2015-02-09 – 2015-02-10 (×3): 3 [IU] via SUBCUTANEOUS
  Administered 2015-02-10 (×2): 4 [IU] via SUBCUTANEOUS
  Administered 2015-02-10 – 2015-02-11 (×3): 3 [IU] via SUBCUTANEOUS
  Administered 2015-02-11: 4 [IU] via SUBCUTANEOUS
  Administered 2015-02-11 (×3): 3 [IU] via SUBCUTANEOUS
  Administered 2015-02-12: 4 [IU] via SUBCUTANEOUS
  Administered 2015-02-12 (×3): 3 [IU] via SUBCUTANEOUS
  Administered 2015-02-12: 4 [IU] via SUBCUTANEOUS
  Administered 2015-02-12: 3 [IU] via SUBCUTANEOUS
  Administered 2015-02-13 (×3): 4 [IU] via SUBCUTANEOUS
  Administered 2015-02-13 – 2015-02-14 (×5): 3 [IU] via SUBCUTANEOUS
  Administered 2015-02-14 (×2): 4 [IU] via SUBCUTANEOUS

## 2015-01-22 NOTE — Progress Notes (Signed)
~  0310: Called by Dr. Jamison NeighborNestor about [employer]. Discussed Radiologist reported. Instructed to use Central Line and pull RUA PICC line. Repeated and Verbalized understanding. Will continue to monitor.

## 2015-01-22 NOTE — Progress Notes (Signed)
PULMONARY / CRITICAL CARE MEDICINE   Name: Rachel MinksStephanie N Vang MRN: [id number] DOB: 06/24/1965    ADMISSION DATE:  01/13/2015  REFERRING MD:  EDP  CHIEF COMPLAINT:  AMS  SUBJECTIVE:  No events overnight, remains unresponsive.   VITAL SIGNS: BP 190/109 mmHg  Pulse 70  Temp(Src) 97.2 F (36.2 C) (Axillary)  Resp 16  Ht 5\' 5"  (1.651 m)  Wt 71.5 kg (157 lb 10.1 oz)  BMI 26.23 kg/m2  SpO2 100%  LMP  (LMP Unknown)  HEMODYNAMICS: CVP:  [6 mmHg-9 mmHg] 6 mmHg  VENTILATOR SETTINGS: Vent Mode:  [-] PRVC FiO2 (%):  [40 %] 40 % Set Rate:  [16 bmp] 16 bmp Vt Set:  [500 mL] 500 mL PEEP:  [5 cmH20] 5 cmH20 Pressure Support:  [10 cmH20] 10 cmH20 Plateau Pressure:  [5 cmH20-15 cmH20] 13 cmH20  INTAKE / OUTPUT: I/O last 3 completed shifts: In: 8367.9 [I.V.:5727.9; NG/GT:1440; IV Piggyback:1200] Out: 5379 [Urine:4400; Drains:79; Stool:900]  PHYSICAL EXAMINATION: General: Off sedation, comatose, opens eyes but not purposeful. Neuro: RASS -2, withdrawing to pain but not following any commands. HEENT: Pupils pinpoint. Cardiovascular: regular, no murmur. Lungs: scattered rhonchi Abdomen: soft, non tender Musculoskeletal: no edema Skin: no rashes  LABS:  BMET  Recent Labs Lab 01/20/15 0450 01/21/15 0621 01/22/15 0430  NA 144 142 141  K 3.3* 3.4* 3.9  CL 108 109 103  CO2 23 24 29   BUN 28* 21* 23*  CREATININE 0.79 0.57 0.62  GLUCOSE 295* 271* 194*   Electrolytes  Recent Labs Lab 01/20/15 0450 01/21/15 0621 01/22/15 0430  CALCIUM 9.4 8.5* 9.4  MG 2.1 1.8 2.1  PHOS 3.4 2.7 4.6   CBC  Recent Labs Lab 01/20/15 0450 01/21/15 0621 01/22/15 0430  WBC 32.6* 28.2* 31.6*  HGB 14.9 13.9 15.1*  HCT 45.7 42.6 45.9  PLT 222 225 269   Coag's No results for input(s): APTT, INR in the last 168 hours. Sepsis Markers  Recent Labs Lab 01/16/15 0600  LATICACIDVEN 2.0   ABG  Recent Labs Lab 01/20/15 0325 01/21/15 0430 01/22/15 0542  PHART 7.394 7.415 7.396   PCO2ART 40.8 35.1 39.7  PO2ART 148* 137* 126.0*   Liver Enzymes No results for input(s): AST, ALT, ALKPHOS, BILITOT, ALBUMIN in the last 168 hours. Cardiac Enzymes No results for input(s): TROPONINI, PROBNP in the last 168 hours. Glucose  Recent Labs Lab 01/21/15 1108 01/21/15 1516 01/21/15 1924 01/21/15 2325 01/22/15 0319 01/22/15 0719  GLUCAP 243* 273* 262* 270* 156* 271*   Imaging Dg Chest Port 1 View  01/22/2015  CLINICAL DATA:  Central line placement.  Initial encounter. EXAM: PORTABLE CHEST 1 VIEW COMPARISON:  Chest radiograph performed 01/21/2015 FINDINGS: The patient's new right IJ line is noted extending into the right atrium. This could be retracted approximately 5 cm, as deemed clinically appropriate. The endotracheal tube is seen ending 3-4 cm above the carina. A right PICC is noted ending about the proximal SVC. An enteric tube is noted extending below the diaphragm. The lungs are hypoexpanded. Mild left basilar atelectasis is noted. No pleural effusion or pneumothorax is seen. The cardiomediastinal silhouette is mildly enlarged. No acute osseous abnormalities are identified. IMPRESSION: 1. Right IJ line noted extending into the right atrium. This could be retracted approximately 5 cm to the cavoatrial junction, as deemed clinically appropriate. 2. Lungs hypoexpanded, with mild left basilar atelectasis. 3. Mild cardiomegaly. These results were called by telephone at the time of interpretation on 01/22/2015 at 2:19 am to [facility] HospitalJasmine RN on  [id number], who verbally acknowledged these results. Electronically Signed   By: Roanna Raider M.D.   On: 01/22/2015 02:19    STUDIES:  12/15 CT head >> [facility] 12/16 Echo >> severe LVH, EF 60 to 65%, grade 1 diastolic dysfx  CULTURES: Blood 12/21>>>GPC Sputum 12/21>>>NTD C diff 12/21>>>negative  ANTIBIOTICS: 12/15 Cefazolin >> 12/17 Vanc 12/21>>> Zosyn 12/21>>>  SIGNIFICANT EVENTS: 12/15 Admit, SAH >> coiling distal ACA aneurysm, IVC  drain placed  LINES/TUBES: 12/15 ETT >> 12/15 Rt IJ CVL >>12/26 12/15 IVC >>  12/18 R PICC >> 12/23 12/23 R IJ TLC>>>  DISCUSSION:   49 yo female smoker presented with altered mental status, Rt pupil dilation from HTN emergency (BP 245/194) and SAH.  ASSESSMENT / PLAN:  NEUROLOGIC A:   Acute encephalopathy 2nd to [facility], s/p coiling. Possible seizure prior to admission. At risk vasospasm, note new decrease in MS 12/19 P:   IVC drain, decadron, AEDs per neurology NS at 150 ml/hr Continue neo for target SBP of 200-220 per neuro's recommendations. Follow CVP. RASS goal 0.  PULMONARY A: Compromise airway 2nd to acute encephalopathy. Tobacco abuse. P:   PS trials but no extubation given mental status. Family deciding on trach/peg vs comfort. F/u CXR F/U ABG.  CARDIOVASCULAR A:  HTN emergency. Elevated troponin - demand ischemia. More hypotensive this AM and requiring more pressors. P:  Nicardipine d/c'd 12/19 in setting of new weakness and obtundation. New goal SBP 200 Neo to SBP of 200-220. Defer cardiology assessment for now  RENAL A:   Lactic acidosis >> resolved. Hypokalemia. P:   Replace electrolytes. F/u BMET. Keep foley in for now >> need to monitor I/O closely during critical illness.  GASTROINTESTINAL A:   Nutrition. P:   Tube feeds while on vent Protonix for SUP  HEMATOLOGIC A:   Mild anemia, thrombocytopenia of critical illness P:  F/u CBC SCDs for DVT prevention  INFECTIOUS A:   Post-IVC placement prophylaxis >> completed 12/17. Febrile overnight. P:   Pan culture. Vanc/zosyn as above. C. Diff negative.  ENDOCRINE A:   Hyperglycemia. P:   SSI - increase to regular. Add lantus 5 units sq BID.  NSG updated family, remains full code, they are discussing code status at this point.  The patient is critically ill with multiple organ systems failure and requires high complexity decision making for assessment and support, frequent  evaluation and titration of therapies, application of advanced monitoring technologies and extensive interpretation of multiple databases.   Critical Care Time devoted to patient care services described in this note is  35  Minutes. This time reflects time of care of this signee Dr Koren Bound. This critical care time does not reflect procedure time, or teaching time or supervisory time of PA/NP/Med student/Med Resident etc but could involve care discussion time.  Alyson Reedy, M.D. [facility] Pulmonary/Critical Care Medicine. Pager: [phone]. After hours pager: [phone].

## 2015-01-22 NOTE — Progress Notes (Signed)
eLink Physician-Brief Progress Note Patient Name: Rachel MinksStephanie N Vang DOB: 06/12/1965 MRN: [id number]   Date of Service  01/22/2015  HPI/Events of Note  Reviewed Portable CXR after line placement. Tip in RA but no pneumothorax.  eICU Interventions  1. Remove PICC 2. Use CVL 3. Plan to withdraw line 5cm in AM.     Intervention Category Intermediate Interventions: Other:  Lawanda CousinsJennings Bernard Slayden 01/22/2015, 3:10 AM

## 2015-01-22 NOTE — Progress Notes (Signed)
~  2330: Pt restless in bed. Moving LUA and LLE purposefully, pt noncompliant with vent, HR 130-140bpm. Sedation turned back on. Noted RUA PICC almost out. Dr. Jamison NeighborNestor notified via [facility]. Blood returned noted. Instructed to leave PICC in, continue vasopressor and sedation gtt until MD can get to unit. ~0030: Dr. Donnamae JudeLo Verde on unit, assess access. Ordered to place central line. Verbal consent from Dedra Skeensarlton Griffin, son,  obtained by this RN and Bonita QuinLinda, RN.  0200: Central line in, orders placed.  ~0220: Call MD to verify placement of CVC by portable xray, will call back. Radiologist called. Informed this RN that central catheter is right atrium and advised to pull back 5cm. Will update MD.

## 2015-01-22 NOTE — Progress Notes (Signed)
eLink Physician-Brief Progress Note Patient Name: Lucienne MinksStephanie N Loyer DOB: 01/29/1966 MRN: [id number]   Date of Service  01/22/2015  HPI/Events of Note  RN notified that patient's PICC line nearly completely removed. Still able to draw blood back. Receiving Vasopressor infusion & Fentanyl gtt with no peripheral IV access.  eICU Interventions  DLV to place central line ASAP.     Intervention Category Major Interventions: Hypotension - evaluation and management  Lawanda CousinsJennings Melodie Ashworth 01/22/2015, 12:38 AM

## 2015-01-22 NOTE — Procedures (Signed)
CENTRAL VENOUS CATHETER INSERTION   Indication: Vascular Access Consent: Emergent  Time out: yes Appropriate position: yes Hand washing: yes Patient Sterilized and Draped: yes Location: Right Internal jugular # of Attempts: 1 Ultrasound Guidance: yes Wire Confirmed with US: yes Insertion depth: 20 cm All Ports Draw and flush: yes CXR:   Pneumothorax: no  Line position appropriate: yes Line sutured in place: yes EBL: 5 cc Complications: none Patient Tolerated Procedure Well: yes  ** 3 failed attempts of Right subclavian.  Vessel was accessed but unable to advance guidewire after 10 cm this site was aborted.   Galvin Profferaniel LoVerde, DO., MS [facility]

## 2015-01-22 NOTE — Progress Notes (Signed)
Subjective: Patient continues on vent via ETT. Dr. Conchita Paris anticipates need for tracheostomy and PEG. Continues on fentanyl drip and Neo-Synephrine drip. Critical care aspects being managed by CCM.  Objective: Vital signs in last 24 hours: Filed Vitals:   01/22/15 0645 01/22/15 0700 01/22/15 0728 01/22/15 0750  BP: 172/107 190/109  154/101  Pulse: 69 70  64  Temp:   97.2 F (36.2 C)   TempSrc:   Axillary   Resp: Height:      Weight:      SpO2: 100% 100%  100%    Intake/Output from previous day: 12/23 0701 - 12/24 0700 In: 5534.5 [I.V.:4214.5; NG/GT:720; IV Piggyback:600] Out: 3326 [Urine:2775; Drains:51; Stool:500] Intake/Output this shift: Total I/O In: 276.3 [I.V.:216.3; NG/GT:60] Out: 132 [Urine:130; Drains:2]  Physical Exam:  Opening eyes spontaneously, and to voice and stimulation. Not following commands. No attempt at speech.  Withdraws to painful stimulation, left greater than right. No purposeful movement. Left gaze tendency.Pupils 2 mm, equal, round, reactive to light.  CBC  Recent Labs  01/21/15 0621 01/22/15 0430  WBC 28.2* 31.6*  HGB 13.9 15.1*  HCT 42.6 45.9  PLT 225 269   BMET  Recent Labs  01/21/15 0621 01/22/15 0430  NA 142 141  K 3.4* 3.9  CL 109 103  CO2 24 29  GLUCOSE 271* 194*  BUN 21* 23*  CREATININE 0.57 0.62  CALCIUM 8.5* 9.4   ABG    Component Value Date/Time   PHART 7.396 01/22/2015 0542   PCO2ART 39.7 01/22/2015 0542   PO2ART 126.0* 01/22/2015 0542   HCO3 24.3* 01/22/2015 0542   TCO2 26 01/22/2015 0542   ACIDBASEDEF 1.9 01/21/2015 0430   O2SAT 99.0 01/22/2015 0542    Studies/Results: Dg Chest Port 1 View  01/22/2015  CLINICAL DATA:  Central line placement.  Initial encounter. EXAM: PORTABLE CHEST 1 VIEW COMPARISON:  Chest radiograph performed 01/21/2015 FINDINGS: The patient's new right IJ line is noted extending into the right atrium. This could be retracted approximately 5 cm, as deemed clinically  appropriate. The endotracheal tube is seen ending 3-4 cm above the carina. A right PICC is noted ending about the proximal SVC. An enteric tube is noted extending below the diaphragm. The lungs are hypoexpanded. Mild left basilar atelectasis is noted. No pleural effusion or pneumothorax is seen. The cardiomediastinal silhouette is mildly enlarged. No acute osseous abnormalities are identified. IMPRESSION: 1. Right IJ line noted extending into the right atrium. This could be retracted approximately 5 cm to the cavoatrial junction, as deemed clinically appropriate. 2. Lungs hypoexpanded, with mild left basilar atelectasis. 3. Mild cardiomegaly. These results were called by telephone at the time of interpretation on 01/22/2015 at 2:19 am to [facility] on [facility], who verbally acknowledged these results. Electronically Signed   By: Roanna Raider M.D.   On: 01/22/2015 02:19   Dg Chest Port 1 View  01/21/2015  CLINICAL DATA:  Evaluate ET tube placement EXAM: PORTABLE CHEST 1 VIEW COMPARISON:  01/20/2015 FINDINGS: Right arm PICC line tip is in the cavoatrial junction. ET tube tip is above the carina. There is a nasogastric tube with tip in the stomach. Moderate cardiac enlargement. There is no pleural effusion or edema. No airspace consolidation. IMPRESSION: 1. ET tube tip in satisfactory position above the carina. 2. Stable cardiac enlargement. Electronically Signed   By: Signa Kell M.D.   On: 01/21/2015 07:57    Assessment/Plan: Continuing supportive care, day 10 SAH.   Hewitt Shorts,  MD 01/22/2015, 10:06 AM

## 2015-01-23 ENCOUNTER — Inpatient Hospital Stay (HOSPITAL_COMMUNITY): Payer: Medicaid Other

## 2015-01-23 LAB — CBC
HEMATOCRIT: 43.6 % (ref 36.0–46.0)
HEMOGLOBIN: 14.2 g/dL (ref 12.0–15.0)
MCH: 31.3 pg (ref 26.0–34.0)
MCHC: 32.6 g/dL (ref 30.0–36.0)
MCV: 96 fL (ref 78.0–100.0)
Platelets: 263 10*3/uL (ref 150–400)
RBC: 4.54 MIL/uL (ref 3.87–5.11)
RDW: 14.4 % (ref 11.5–15.5)
WBC: 33.7 10*3/uL — ABNORMAL HIGH (ref 4.0–10.5)

## 2015-01-23 LAB — BASIC METABOLIC PANEL
Anion gap: 11 (ref 5–15)
BUN: 24 mg/dL — AB (ref 6–20)
CALCIUM: 9.2 mg/dL (ref 8.9–10.3)
CHLORIDE: 97 mmol/L — AB (ref 101–111)
CO2: 27 mmol/L (ref 22–32)
CREATININE: 0.68 mg/dL (ref 0.44–1.00)
GFR calc non Af Amer: >60 mL/min (ref >60)
Glucose, Bld: 269 mg/dL — ABNORMAL HIGH (ref 65–99)
Potassium: 3.7 mmol/L (ref 3.5–5.1)
Sodium: 135 mmol/L (ref 135–145)

## 2015-01-23 LAB — POCT I-STAT 3, ART BLOOD GAS (G3+)
Acid-Base Excess: 1 mmol/L (ref 0.0–2.0)
Bicarbonate: 26.6 mEq/L — ABNORMAL HIGH (ref 20.0–24.0)
O2 Saturation: 99 %
PCO2 ART: 44.3 mmHg (ref 35.0–45.0)
TCO2: 28 mmol/L (ref 0–100)
pH, Arterial: 7.387 (ref 7.350–7.450)
pO2, Arterial: 141 mmHg — ABNORMAL HIGH (ref 80.0–100.0)

## 2015-01-23 LAB — CULTURE, BLOOD (ROUTINE X 2)

## 2015-01-23 LAB — GLUCOSE, CAPILLARY
GLUCOSE-CAPILLARY: 190 mg/dL — AB (ref 65–99)
GLUCOSE-CAPILLARY: 205 mg/dL — AB (ref 65–99)
GLUCOSE-CAPILLARY: 205 mg/dL — AB (ref 65–99)
GLUCOSE-CAPILLARY: 236 mg/dL — AB (ref 65–99)
GLUCOSE-CAPILLARY: 246 mg/dL — AB (ref 65–99)
GLUCOSE-CAPILLARY: 269 mg/dL — AB (ref 65–99)

## 2015-01-23 LAB — PHOSPHORUS: Phosphorus: 4.3 mg/dL (ref 2.5–4.6)

## 2015-01-23 LAB — MAGNESIUM: Magnesium: 1.9 mg/dL (ref 1.7–2.4)

## 2015-01-23 MED ORDER — PIPERACILLIN-TAZOBACTAM 3.375 G IVPB
3.3750 g | Freq: Three times a day (TID) | INTRAVENOUS | Status: AC
  Administered 2015-01-23 – 2015-01-25 (×7): 3.375 g via INTRAVENOUS
  Filled 2015-01-23 (×7): qty 50

## 2015-01-23 MED ORDER — "THROMBI-PAD 3""X3"" EX PADS"
1.0000 | MEDICATED_PAD | Freq: Once | CUTANEOUS | Status: AC
  Administered 2015-01-23: 1 via TOPICAL
  Filled 2015-01-23: qty 1

## 2015-01-23 MED ORDER — "THROMBI-PAD 3""X3"" EX PADS"
1.0000 | MEDICATED_PAD | Freq: Once | CUTANEOUS | Status: DC
  Filled 2015-01-23 (×2): qty 1

## 2015-01-23 NOTE — Progress Notes (Signed)
PULMONARY / CRITICAL CARE MEDICINE   Name: Rachel Vang MRN: [id number] DOB: Dec 02, 1965    ADMISSION DATE:  01/13/2015  REFERRING MD:  EDP  CHIEF COMPLAINT:  AMS  SUBJECTIVE:  No events overnight, remains unresponsive.   VITAL SIGNS: BP 211/117 mmHg  Pulse 63  Temp(Src) 99 F (37.2 C) (Axillary)  Resp 11  Ht  (1.651 m)  Wt 74.7 kg (164 lb 10.9 oz)  BMI 27.40 kg/m2  SpO2 100%  LMP  (LMP Unknown)  HEMODYNAMICS: CVP:  [7 mmHg-11 mmHg] 11 mmHg  VENTILATOR SETTINGS: Vent Mode:  [-] PRVC FiO2 (%):  [40 %] 40 % Set Rate:  [16 bmp] 16 bmp Vt Set:  [500 mL] 500 mL PEEP:  [5 cmH20] 5 cmH20 Pressure Support:  [5 cmH20-10 cmH20] 5 cmH20 Plateau Pressure:  [11 cmH20-15 cmH20] 11 cmH20  INTAKE / OUTPUT: I/O last 3 completed shifts: In: 7714.9 [I.V.:4692.4; NG/GT:2260; IV Piggyback:762.5] Out: 5261 [Urine:4595; Drains:66; Stool:600]  PHYSICAL EXAMINATION: General: Off sedation, comatose, opens eyes but not purposeful. Neuro: RASS -2, withdrawing to pain but not following any commands. HEENT: Pupils pinpoint. Cardiovascular: regular, no murmur. Lungs: scattered rhonchi Abdomen: soft, non tender Musculoskeletal: no edema Skin: no rashes  LABS:  BMET  Recent Labs Lab 01/21/15 0621 01/22/15 0430 01/23/15 0530  NA 142 141 135  K 3.4* 3.9 3.7  CL 109 103 97*  CO2 BUN 21* 23* 24*  CREATININE 0.57 0.62 0.68  GLUCOSE 271* 194* 269*   Electrolytes  Recent Labs Lab 01/21/15 0621 01/22/15 0430 01/23/15 0530  CALCIUM 8.5* 9.4 9.2  MG 1.8 2.1 1.9  PHOS 2.7 4.6 4.3   CBC  Recent Labs Lab 01/21/15 0621 01/22/15 0430 01/23/15 0530  WBC 28.2* 31.6* 33.7*  HGB 13.9 15.1* 14.2  HCT 42.6 45.9 43.6  PLT 225 269 263   Coag's No results for input(s): APTT, INR in the last 168 hours. Sepsis Markers No results for input(s): LATICACIDVEN, PROCALCITON, O2SATVEN in the last 168 hours. ABG  Recent Labs Lab 01/21/15 0430 01/22/15 0542  01/23/15 0419  PHART 7.415 7.396 7.387  PCO2ART 35.1 39.7 44.3  PO2ART 137* 126.0* 141.0*   Liver Enzymes No results for input(s): AST, ALT, ALKPHOS, BILITOT, ALBUMIN in the last 168 hours. Cardiac Enzymes No results for input(s): TROPONINI, PROBNP in the last 168 hours. Glucose  Recent Labs Lab 01/22/15 1238 01/22/15 1554 01/22/15 1925 01/22/15 2340 01/23/15 0321 01/23/15 0757  GLUCAP 324* 215* 202* 269* 246* 205*   Imaging No results found.  STUDIES:  12/15 CT head >> [facility] 12/16 Echo >> severe LVH, EF 60 to 65%, grade 1 diastolic dysfx  CULTURES: Blood 12/21>>>GPC Sputum 12/21>>>NTD C diff 12/21>>>negative  ANTIBIOTICS: 12/15 Cefazolin >> 12/17 Vanc 12/21>>>12/24 Zosyn 12/21>>>  SIGNIFICANT EVENTS: 12/15 Admit, SAH >> coiling distal ACA aneurysm, IVC drain placed  LINES/TUBES: 12/15 ETT >> 12/15 Rt IJ CVL >>12/26 12/15 IVC >>  12/18 R PICC >> 12/23 12/23 R IJ TLC>>>  DISCUSSION:   49 yo female smoker presented with altered mental status, Rt pupil dilation from HTN emergency (BP 245/194) and SAH.  ASSESSMENT / PLAN:  NEUROLOGIC A:   Acute encephalopathy 2nd to [facility], s/p coiling. Possible seizure prior to admission. At risk vasospasm, note new decrease in MS 12/19 P:   IVC drain, decadron, AEDs per neurology Decrease NS at 50 ml/hr Continue neo for target SBP of 170-180 per neuro's recommendations, discussed with Kritzer. No sedation.  PULMONARY A: Compromise airway  2nd to acute encephalopathy. Tobacco abuse. P:   PS trials but no extubation given mental status. Family deciding on trach/peg vs comfort. F/u CXR F/U ABG.  CARDIOVASCULAR A:  HTN emergency. Elevated troponin - demand ischemia. More hypotensive this AM and requiring more pressors. P:  Nicardipine d/c'd 12/19 in setting of new weakness and obtundation. New goal SBP 200 Neo to SBP of 170-180. Defer cardiology assessment for now.  RENAL A:   Lactic acidosis >>  resolved. Hypokalemia. P:   Replace electrolytes. F/u BMET. Keep foley in for now >> need to monitor I/O closely during critical illness.  GASTROINTESTINAL A:   Nutrition. P:   Tube feeds while on vent Protonix for SUP  HEMATOLOGIC A:   Mild anemia, thrombocytopenia of critical illness P:  F/u CBC SCDs for DVT prevention  INFECTIOUS A:   Post-IVC placement prophylaxis >> completed 12/17. Febrile overnight. P:   Pan culture. Zosyn as above. C. Diff negative.  ENDOCRINE A:   Hyperglycemia. P:   SSI - increase to regular. Continue lantus 5 units sq BID.  NSG updated family, remains full code, they are discussing code status at this point.  The patient is critically ill with multiple organ systems failure and requires high complexity decision making for assessment and support, frequent evaluation and titration of therapies, application of advanced monitoring technologies and extensive interpretation of multiple databases.   Critical Care Time devoted to patient care services described in this note is  35  Minutes. This time reflects time of care of this signee Dr Koren BoundWesam Chester Sibert. This critical care time does not reflect procedure time, or teaching time or supervisory time of PA/NP/Med student/Med Resident etc but could involve care discussion time.  Alyson ReedyWesam G. Shuayb Schepers, M.D. [facility]. Pager: [phone]. After hours pager: [phone].

## 2015-01-23 NOTE — Progress Notes (Signed)
RT found pt's ett at 16 at the lip. Pt cuff deflated and advanced back to 22 at the lip and secured. Pt with coarse bilateral BS throughout. RN made aware. RT will continue to monitor.

## 2015-01-23 NOTE — Progress Notes (Signed)
eLink Physician-Brief Progress Note Patient Name: Rachel MinksStephanie N Vang DOB: 12/23/1965 MRN: [id number]   Date of Service  01/23/2015  HPI/Events of Note  CVL SITE OOZING EASY   eICU Interventions  LOAL PRESSUR X 10 MIN THROMBIPA D AND ADDITIONAL 5 MINP PRESSURE CXR     Intervention Category Intermediate Interventions: Bleeding - evaluation and treatment with blood products  Maddix Heinz 01/23/2015, 5:19 AM

## 2015-01-23 NOTE — Progress Notes (Signed)
Patient ID: Lucienne MinksStephanie N Diener, female   DOB: 12/28/1965, 49 y.o.   MRN: [id number] Subjective: Patient reports unresponsive  Objective: Vital signs in last 24 hours: Temp:  [99 F (37.2 C)-100.4 F (38 C)] 99.3 F (37.4 C) (12/25 1000) Pulse Rate:  [57-92] 65 (12/25 1000) Resp:  [10-24] 12 (12/25 1000) BP: (90-230)/(54-148) 145/114 mmHg (12/25 1000) SpO2:  [100 %] 100 % (12/25 1000) FiO2 (%):  [40 %] 40 % (12/25 0800) Weight:  [74.7 kg (164 lb 10.9 oz)] 74.7 kg (164 lb 10.9 oz) (12/25 0300)  Intake/Output from previous day: 12/24 0701 - 12/25 0700 In: 5591.1 [I.V.:3888.6; NG/GT:1540; IV Piggyback:162.5] Out: 4098 [[id number] [Urine:3445; Drains:41; Stool:400] Intake/Output this shift: Total I/O In: 744.1 [I.V.:539.1; NG/GT:180; IV Piggyback:25] Out: 455 [Urine:450; Drains:5]  no response  Lab Results:  Recent Labs  01/22/15 0430 01/23/15 0530  WBC 31.6* 33.7*  HGB 15.1* 14.2  HCT 45.9 43.6  PLT 269 263   BMET  Recent Labs  01/22/15 0430 01/23/15 0530  NA 141 135  K 3.9 3.7  CL 103 97*  CO2 29 27  GLUCOSE 194* 269*  BUN 23* 24*  CREATININE 0.62 0.68  CALCIUM 9.4 9.2    Studies/Results: Dg Chest Port 1 View  01/23/2015  CLINICAL DATA:  Evaluate ET tube placement EXAM: PORTABLE CHEST 1 VIEW COMPARISON:  01/22/2015 FINDINGS: The endotracheal tube tip is above the carina. There is a right IJ catheter with tip in the right atrium. Nasogastric tube tip is below the field of view. Normal heart size. Mild pulmonary edema is identified, increased from previous exam. IMPRESSION: 1. Stable support apparatus. 2. Mild pulmonary edema. Electronically Signed   By: Signa Kellaylor  Stroud M.D.   On: 01/23/2015 09:55   Dg Chest Port 1 View  01/22/2015  CLINICAL DATA:  Central line placement.  Initial encounter. EXAM: PORTABLE CHEST 1 VIEW COMPARISON:  Chest radiograph performed 01/21/2015 FINDINGS: The patient's new right IJ line is noted extending into the right atrium. This could be retracted  approximately 5 cm, as deemed clinically appropriate. The endotracheal tube is seen ending 3-4 cm above the carina. A right PICC is noted ending about the proximal SVC. An enteric tube is noted extending below the diaphragm. The lungs are hypoexpanded. Mild left basilar atelectasis is noted. No pleural effusion or pneumothorax is seen. The cardiomediastinal silhouette is mildly enlarged. No acute osseous abnormalities are identified. IMPRESSION: 1. Right IJ line noted extending into the right atrium. This could be retracted approximately 5 cm to the cavoatrial junction, as deemed clinically appropriate. 2. Lungs hypoexpanded, with mild left basilar atelectasis. 3. Mild cardiomegaly. These results were called by telephone at the time of interpretation on 01/22/2015 at 2:19 am to [id number], who verbally acknowledged these results. Electronically Signed   By: Roanna RaiderJeffery  Chang M.D.   On: 01/22/2015 02:19    Assessment/Plan: Remains in poor shape; she has been on aggressive HHH therapy for 10 days; will start to slowly lower parameters for blood pressure. Otherwsie stable but doing very poorly.   LOS: 10 days  as above   Reinaldo MeekerKRITZER,Thurston Brendlinger O, MD 01/23/2015, 10:27 AM

## 2015-01-23 NOTE — Progress Notes (Signed)
Patient's blood pressure dropped below order parameters to keep SBP 200-220. Neo gtt maxed at 400 mcg. Increase in urine output, but CVP at 9. No neuro change. Dr. Newell CoralNudelman notified and aware. MD instructed to keep the same BP goal with Neo when possible. Would not like to add more pressors at this time. No new orders received. Will continue to monitor closely.

## 2015-01-24 ENCOUNTER — Inpatient Hospital Stay (HOSPITAL_COMMUNITY): Payer: Medicaid Other

## 2015-01-24 DIAGNOSIS — I609 Nontraumatic subarachnoid hemorrhage, unspecified: Secondary | ICD-10-CM

## 2015-01-24 LAB — CBC
HEMATOCRIT: 42.3 % (ref 36.0–46.0)
HEMOGLOBIN: 14.1 g/dL (ref 12.0–15.0)
MCH: 31.7 pg (ref 26.0–34.0)
MCHC: 33.3 g/dL (ref 30.0–36.0)
MCV: 95.1 fL (ref 78.0–100.0)
Platelets: 261 10*3/uL (ref 150–400)
RBC: 4.45 MIL/uL (ref 3.87–5.11)
RDW: 14.3 % (ref 11.5–15.5)
WBC: 35.8 10*3/uL — ABNORMAL HIGH (ref 4.0–10.5)

## 2015-01-24 LAB — GLUCOSE, CAPILLARY
GLUCOSE-CAPILLARY: 197 mg/dL — AB (ref 65–99)
GLUCOSE-CAPILLARY: 160 mg/dL — AB (ref 65–99)
Glucose-Capillary: 185 mg/dL — ABNORMAL HIGH (ref 65–99)
Glucose-Capillary: 185 mg/dL — ABNORMAL HIGH (ref 65–99)
Glucose-Capillary: 205 mg/dL — ABNORMAL HIGH (ref 65–99)
Glucose-Capillary: 205 mg/dL — ABNORMAL HIGH (ref 65–99)
Glucose-Capillary: 232 mg/dL — ABNORMAL HIGH (ref 65–99)

## 2015-01-24 LAB — BLOOD GAS, ARTERIAL
ACID-BASE EXCESS: 4.8 mmol/L — AB (ref 0.0–2.0)
Bicarbonate: 28.8 mEq/L — ABNORMAL HIGH (ref 20.0–24.0)
DRAWN BY: 42624
FIO2: 0.4
LHR: 16 {breaths}/min
MECHVT: 500 mL
O2 Saturation: 99.1 %
PATIENT TEMPERATURE: 98.6
PCO2 ART: 42.7 mmHg (ref 35.0–45.0)
PEEP/CPAP: 5 cmH2O
PH ART: 7.443 (ref 7.350–7.450)
PO2 ART: 154 mmHg — AB (ref 80.0–100.0)
TCO2: 30.1 mmol/L (ref 0–100)

## 2015-01-24 LAB — CULTURE, BLOOD (ROUTINE X 2): Culture: NO GROWTH

## 2015-01-24 LAB — BASIC METABOLIC PANEL
Anion gap: 10 (ref 5–15)
BUN: 20 mg/dL (ref 6–20)
CALCIUM: 9.3 mg/dL (ref 8.9–10.3)
CHLORIDE: 97 mmol/L — AB (ref 101–111)
CO2: 30 mmol/L (ref 22–32)
CREATININE: 0.66 mg/dL (ref 0.44–1.00)
GFR calc non Af Amer: >60 mL/min (ref >60)
Glucose, Bld: 255 mg/dL — ABNORMAL HIGH (ref 65–99)
Potassium: 4.4 mmol/L (ref 3.5–5.1)
SODIUM: 137 mmol/L (ref 135–145)

## 2015-01-24 LAB — MAGNESIUM: Magnesium: 2 mg/dL (ref 1.7–2.4)

## 2015-01-24 LAB — PHOSPHORUS: PHOSPHORUS: 3.3 mg/dL (ref 2.5–4.6)

## 2015-01-24 NOTE — Progress Notes (Signed)
No acute events AVSS Eyes open spontaneously, blinks to threat No spontaneous movements Withdraws EVD functioning Stable Continue current care

## 2015-01-24 NOTE — Progress Notes (Signed)
Transcranial Doppler  Date POD PCO2 HCT BP  MCA ACA PCA OPHT SIPH VERT Basilar  01/15/15 JE     Right  Left   72  105   -47  -61   29  43   16  17   -54     -32     -32 ?      01-17-15 hc     Right  Left   65  53   -67  -61   30  33   38  25     24     -47     -44    01/19/15 MS     Right  Left   72  81   -45  -77   32  40   24  31   33  *   *  *   *      01/21/15 MS      Right  Left   56  108   -48  -37   61  -21   19  19    *  *   -17  -38   *      01/24/15 MS      Right  Left   112  116   -29  *   29  36   16  22   *  *   *  *   *           Right  Left                                            Right  Left                                        MCA = Middle Cerebral Artery      OPHT = Opthalmic Artery     BASILAR = Basilar Artery   ACA = Anterior Cerebral Artery     SIPH = Carotid Siphon PCA = Posterior Cerebral Artery   VERT = Verterbral Artery                   Normal MCA = 62+\-12 ACA = 50+\-12 PCA = 42+\-23   01/15/15 Unable to insonate left siphon (pt was becoming agitated at this point). Unable to attempt left vertebral due to left neck line. Unsure if basilar mean velocity is accurate due to poor window/ positioning. JE  01/19/15- * Unable to insonate due to poor patient cooperation and poor acoustic window.  01/21/15- * Unable to insonate due to poor patient cooperation.  01/24/15- * Unable to insonate due to poor patient cooperation, bandaging, and patient position.  01/24/2015 10:11 AM Gertie FeyMichelle Mio Schellinger, RVT, RDCS, RDMS

## 2015-01-24 NOTE — Progress Notes (Signed)
PULMONARY / CRITICAL CARE MEDICINE   Name: Rachel Vang MRN: [id number] DOB: 04/26/1965    ADMISSION DATE:  01/13/2015  REFERRING MD:  EDP  CHIEF COMPLAINT:  AMS  SUBJECTIVE:  Tolerating pressure support.  VITAL SIGNS: BP 208/118 mmHg  Pulse 61  Temp(Src) 99.5 F (37.5 C) (Axillary)  Resp 16  Ht  (1.651 m)  Wt 162 lb 14.7 oz (73.9 kg)  BMI 27.11 kg/m2  SpO2 100%  LMP  (LMP Unknown)  HEMODYNAMICS: CVP:  [1 mmHg-7 mmHg] 5 mmHg  VENTILATOR SETTINGS: Vent Mode:  [-] PRVC FiO2 (%):  [30 %-40 %] 30 % Set Rate:  [16 bmp] 16 bmp Vt Set:  [500 mL] 500 mL PEEP:  [5 cmH20] 5 cmH20 Pressure Support:  [5 cmH20-10 cmH20] 5 cmH20 Plateau Pressure:  [12 cmH20-16 cmH20] 14 cmH20  INTAKE / OUTPUT: I/O last 3 completed shifts: In: 8038.3 [I.V.:5525.8; NG/GT:2350; IV Piggyback:162.5] Out: 7010 [Urine:6380; Drains:80; Stool:550]  PHYSICAL EXAMINATION: General: ill appearing Neuro: unresponsive HEENT: Pupils pinpoint. Cardiovascular: regular, no murmur Lungs: scattered rhonchi Abdomen: soft, non tender Musculoskeletal: no edema Skin: no rashes  LABS:  BMET  Recent Labs Lab 01/22/15 0430 01/23/15 0530 01/24/15 0455  NA 141 135 137  K 3.9 3.7 4.4  CL 103 97* 97*  CO2 BUN 23* 24* 20  CREATININE 0.62 0.68 0.66  GLUCOSE 194* 269* 255*   Electrolytes  Recent Labs Lab 01/22/15 0430 01/23/15 0530 01/24/15 0455  CALCIUM 9.4 9.2 9.3  MG 2.1 1.9 2.0  PHOS 4.6 4.3 3.3   CBC  Recent Labs Lab 01/22/15 0430 01/23/15 0530 01/24/15 0455  WBC 31.6* 33.7* 35.8*  HGB 15.1* 14.2 14.1  HCT 45.9 43.6 42.3  PLT 269 263 261   ABG  Recent Labs Lab 01/22/15 0542 01/23/15 0419 01/24/15 0400  PHART 7.396 7.387 7.443  PCO2ART 39.7 44.3 42.7  PO2ART 126.0* 141.0* 154*   Glucose  Recent Labs Lab 01/23/15 0757 01/23/15 1130 01/23/15 1551 01/23/15 1942 01/23/15 2336 01/24/15 0313  GLUCAP 205* 236* 190* 205* 232* 205*   Imaging Dg Chest  Port 1 View  01/24/2015  CLINICAL DATA:  Endotracheal tube placement. EXAM: PORTABLE CHEST 1 VIEW COMPARISON:  01/23/2015 and 01/22/2015. FINDINGS: 0555 hours. The endotracheal tube has been advanced and is now 1 cm above the carina. Right IJ central venous catheter projects to the mid right atrial level. Nasogastric tube projects below the diaphragm, tip not visualized. The heart size and mediastinal contours are stable. There is stable mild atelectasis at both lung bases. No pneumothorax or significant pleural effusion identified. Multiple lines overlie the chest. IMPRESSION: The endotracheal tube is low with its tip within 1 cm of carina. For more optimal positioning, this could be withdrawn 3-4 cm. No other significant changes. Electronically Signed   By: Carey Bullocks M.D.   On: 01/24/2015 08:07    STUDIES:  12/15 CT head >> [facility] 12/16 Echo >> severe LVH, EF 60 to 65%, grade 1 diastolic dysfx  CULTURES: Blood 12/21 >>> GPC >> Sputum 12/21 >>> Haemophilus influenzae C diff 12/21 >>> negative  ANTIBIOTICS: 12/15 Cefazolin >> 12/17 Vanc 12/21>>>12/24 Zosyn 12/21>>>  SIGNIFICANT EVENTS: 12/15 Admit, SAH >> coiling distal ACA aneurysm, IVC drain placed  LINES/TUBES: 12/15 ETT >> 12/15 Rt IJ CVL >>12/26 12/15 IVC >>  12/18 R PICC >> 12/23 12/23 R IJ TLC>>>  DISCUSSION:   49 yo female smoker presented with altered mental status, Rt pupil dilation from HTN emergency (  BP 245/194) and SAH.  ASSESSMENT / PLAN:  NEUROLOGIC A:   [facility] s/p coiling. Possible seizure prior to admission. P:   IVC drain, decadron, AEDs per neurology Continue nimodipine, zocor NS at 50 ml/hr Continue neo for target SBP of >170 per neuro's recommendations in setting of concern for vasospasm  PULMONARY A: Compromise airway 2nd to acute encephalopathy. Tobacco abuse. P:   PS trials but no extubation given mental status Family deciding on trach/peg vs  comfort  CARDIOVASCULAR A:  HTN emergency. Elevated troponin - demand ischemia. P:  Defer cardiology assessment for now  RENAL A:   Lactic acidosis >> resolved. Hypokalemia. P:   Monitor renal fx, urine outpt  GASTROINTESTINAL A:   Nutrition. P:   Tube feeds while on vent Protonix for SUP  HEMATOLOGIC A:   Mild anemia, thrombocytopenia of critical illness >> resolved. P:  F/u CBC SCDs for DVT prevention  INFECTIOUS A:   Post-IVC placement prophylaxis >> completed 12/17. Tracheo-bronchitis with H influenzae in sputum. GPC in blood cx from 12/21. P:   Day 6 of zosyn >> narrow once cx results finalized  ENDOCRINE A:   Steroid induced hyperglycemia. P:   SSI  Goals of Care >> family deciding about trach/PEG versus transition to comfort care.  She likely would not need vent support long term, but would require trach for airway control.  CC time 32 minutes.  Coralyn HellingVineet Rennee Coyne, MD [facility] 01/24/2015, 8:31 AM Pager:  [phone] After 3pm call: [phone]

## 2015-01-25 LAB — GLUCOSE, CAPILLARY
GLUCOSE-CAPILLARY: 200 mg/dL — AB (ref 65–99)
Glucose-Capillary: 216 mg/dL — ABNORMAL HIGH (ref 65–99)
Glucose-Capillary: 201 mg/dL — ABNORMAL HIGH (ref 65–99)
Glucose-Capillary: 158 mg/dL — ABNORMAL HIGH (ref 65–99)
Glucose-Capillary: 185 mg/dL — ABNORMAL HIGH (ref 65–99)
Glucose-Capillary: 200 mg/dL — ABNORMAL HIGH (ref 65–99)

## 2015-01-25 LAB — CBC
HCT: 41.5 % (ref 36.0–46.0)
Hemoglobin: 13.5 g/dL (ref 12.0–15.0)
MCH: 31.2 pg (ref 26.0–34.0)
MCHC: 32.5 g/dL (ref 30.0–36.0)
MCV: 95.8 fL (ref 78.0–100.0)
Platelets: 280 10*3/uL (ref 150–400)
RBC: 4.33 MIL/uL (ref 3.87–5.11)
RDW: 14.4 % (ref 11.5–15.5)
WBC: 37.5 10*3/uL — ABNORMAL HIGH (ref 4.0–10.5)

## 2015-01-25 LAB — BASIC METABOLIC PANEL
Anion gap: 9 (ref 5–15)
BUN: 21 mg/dL — AB (ref 6–20)
CHLORIDE: 97 mmol/L — AB (ref 101–111)
CO2: 31 mmol/L (ref 22–32)
CREATININE: 0.55 mg/dL (ref 0.44–1.00)
Calcium: 9.4 mg/dL (ref 8.9–10.3)
GFR calc Af Amer: >60 mL/min (ref >60)
GLUCOSE: 241 mg/dL — AB (ref 65–99)
Potassium: 3.9 mmol/L (ref 3.5–5.1)
SODIUM: 137 mmol/L (ref 135–145)

## 2015-01-25 MED ORDER — SODIUM CHLORIDE 0.9 % IV BOLUS (SEPSIS)
1000.0000 mL | Freq: Once | INTRAVENOUS | Status: AC
  Administered 2015-01-26: 1000 mL via INTRAVENOUS

## 2015-01-25 NOTE — Progress Notes (Signed)
Inpatient Diabetes Program Recommendations  AACE/ADA: New Consensus Statement on Inpatient Glycemic Control (2015)  Target Ranges:  Prepandial:   less than 140 mg/dL      Peak postprandial:   less than 180 mg/dL (1-2 hours)      Critically ill patients:  140 - 180 mg/dL   Review of Glycemic Control  Results for Rachel Vang, Rachel Vang (MRN [id number]) as of 01/25/2015 12:36  Ref. Range 01/24/2015 15:56 01/24/2015 19:42 01/24/2015 23:26 01/25/2015 03:39 01/25/2015 08:09  Glucose-Capillary Latest Ref Range: 65-99 mg/dL 562205 (H) 130160 (H) 865185 (H) 216 (H) 201 (H)   Needs insulin adjustment.  Inpatient Diabetes Program Recommendations: Increase Lantus to 12 units bid. Add Novolog 3 units Q4H for TF coverage.   Will continue to follow.  Thank you. Ailene Ardshonda Valora Norell, RD, LDN, CDE Inpatient Diabetes Coordinator [phone] you.

## 2015-01-25 NOTE — Progress Notes (Signed)
eLink Physician-Brief Progress Note Patient Name: Rachel MinksStephanie N Knieriem DOB: 03/20/1965 MRN: [id number]   Date of Service  01/25/2015  HPI/Events of Note  Multiple issues: 1. CVP = 3 and 2. Agitation - Fentanyl IV infusion is at goal of 130 mcg/hour.   eICU Interventions  Will order: 1. Bolus with 0.9 NaCl 1 liter IV over 1 hour now.  2. Increase Fentanyl IV infusion ceiling to 200 mcg/hour.      Intervention Category Intermediate Interventions: Hypotension - evaluation and management Minor Interventions: Agitation / anxiety - evaluation and management  Elanda Garmany Eugene 01/25/2015, 11:58 PM

## 2015-01-25 NOTE — Progress Notes (Signed)
PULMONARY / CRITICAL CARE MEDICINE   Name: Rachel Vang MRN: [id number] DOB: [birth year]    ADMISSION DATE:  01/13/2015  REFERRING MD:  EDP  CHIEF COMPLAINT:  AMS  SUBJECTIVE:  Tolerating pressure support.   VITAL SIGNS: BP 128/67 mmHg  Pulse 76  Temp(Src) 99.3 F (37.4 C) (Axillary)  Resp 14  Ht  (1.651 m)  Wt 161 lb 13.1 oz (73.4 kg)  BMI 26.93 kg/m2  SpO2 100%  LMP  (LMP Unknown)  HEMODYNAMICS: CVP:  [5 mmHg-7 mmHg] 7 mmHg  VENTILATOR SETTINGS: Vent Mode:  [-] PRVC FiO2 (%):  [30 %] 30 % Set Rate:  [16 bmp] 16 bmp Vt Set:  [500 mL] 500 mL PEEP:  [5 cmH20] 5 cmH20 Pressure Support:  [8 cmH20] 8 cmH20 Plateau Pressure:  [12 cmH20-26 cmH20] 26 cmH20  INTAKE / OUTPUT: I/O last 3 completed shifts: In: 7773.1 [I.V.:5220.6; NG/GT:2340; IV Piggyback:212.5] Out: 4098 [JXBJY:7829; Drains:157; Stool:950]  PHYSICAL EXAMINATION: General: ill appearing Neuro: opens eyes spontaneously, withdraws to pain, not following commands HEENT: ETT in place Cardiovascular: regular, no murmur Lungs: scattered rhonchi Abdomen: soft, non tender Musculoskeletal: no edema Skin: no rashes  LABS:  BMET  Recent Labs Lab 01/23/15 0530 01/24/15 0455 01/25/15 0355  NA 135 137 137  K 3.7 4.4 3.9  CL 97* 97* 97*  CO2 BUN 24* 20 21*  CREATININE 0.68 0.66 0.55  GLUCOSE 269* 255* 241*   Electrolytes  Recent Labs Lab 01/22/15 0430 01/23/15 0530 01/24/15 0455 01/25/15 0355  CALCIUM 9.4 9.2 9.3 9.4  MG 2.1 1.9 2.0  --   PHOS 4.6 4.3 3.3  --    CBC  Recent Labs Lab 01/23/15 0530 01/24/15 0455 01/25/15 0355  WBC 33.7* 35.8* 37.5*  HGB 14.2 14.1 13.5  HCT 43.6 42.3 41.5  PLT 263 261 280   ABG  Recent Labs Lab 01/22/15 0542 01/23/15 0419 01/24/15 0400  PHART 7.396 7.387 7.443  PCO2ART 39.7 44.3 42.7  PO2ART 126.0* 141.0* 154*   Glucose  Recent Labs Lab 01/24/15 1209 01/24/15 1556 01/24/15 1942 01/24/15 2326 01/25/15 0339  01/25/15 0809  GLUCAP 197* 205* 160* 185* 216* 201*   Imaging No results found.  STUDIES:  12/15 CT head >> [facility] 12/16 Echo >> severe LVH, EF 60 to 65%, grade 1 diastolic dysfx  CULTURES: Blood 12/21 >>> coag neg Staph Sputum 12/21 >>> Haemophilus influenzae C diff 12/21 >>> negative  ANTIBIOTICS: 12/15 Cefazolin >> 12/17 Vanc 12/21>>>12/24 Zosyn 12/21>>> 12/27  SIGNIFICANT EVENTS: 12/15 Admit, SAH >> coiling distal ACA aneurysm, IVC drain placed  LINES/TUBES: 12/15 ETT >> 12/15 Rt IJ CVL >>12/26 12/15 IVC >>  12/18 R PICC >> 12/23 12/23 R IJ TLC>>>  DISCUSSION:   49 yo female smoker presented with altered mental status, Rt pupil dilation from HTN emergency (BP 245/194) and SAH.  ASSESSMENT / PLAN:  NEUROLOGIC A:   Acute encephalopathy 2nd to [facility], s/p coiling. Possible seizure prior to admission. P:   IVC drain, decadron, AEDs per neurology Continue nimodipine, zocor NS at 70 ml/hr Continue neo for target SBP of >170 per neuro's recommendations in setting of concern for vasospasm  PULMONARY A: Compromise airway 2nd to acute encephalopathy. Tobacco abuse. P:   PS trials but no extubation given mental status Family deciding on trach/peg vs comfort care F/u CXR intermittently  CARDIOVASCULAR A:  HTN emergency. Elevated troponin - demand ischemia. P:  Defer cardiology assessment for now  RENAL A:   Lactic  acidosis >> resolved. Hypokalemia. P:   Monitor renal fx, urine outpt  GASTROINTESTINAL A:   Nutrition. P:   Tube feeds while on vent Protonix for SUP  HEMATOLOGIC A:   Mild anemia, thrombocytopenia of critical illness >> resolved. P:  F/u CBC SCDs for DVT prevention  INFECTIOUS A:   Post-IVC placement prophylaxis >> completed 12/17. Tracheo-bronchitis with H influenzae in sputum from 12/21. Coag neg Staph in blood cx from 12/21 >> contaminate. P:   D/c zosyn after dose on 12/27  ENDOCRINE A:   Steroid induced hyperglycemia. P:    SSI  Goals of Care >> family deciding about trach/PEG versus transition to comfort care.  She likely would not need vent support long term, but would require trach for airway control.  CC time 31 minutes.  C[place] [address] [facility] 01/25/2015, 9:34 AM Pager:  [phone] After 3pm call: [phone]

## 2015-01-26 ENCOUNTER — Ambulatory Visit (HOSPITAL_COMMUNITY): Payer: Medicaid Other | Attending: Neurosurgery

## 2015-01-26 ENCOUNTER — Inpatient Hospital Stay (HOSPITAL_COMMUNITY): Payer: Medicaid Other

## 2015-01-26 DIAGNOSIS — I10 Essential (primary) hypertension: Secondary | ICD-10-CM | POA: Insufficient documentation

## 2015-01-26 DIAGNOSIS — I609 Nontraumatic subarachnoid hemorrhage, unspecified: Secondary | ICD-10-CM

## 2015-01-26 LAB — GLUCOSE, CAPILLARY
Glucose-Capillary: 213 mg/dL — ABNORMAL HIGH (ref 65–99)
Glucose-Capillary: 179 mg/dL — ABNORMAL HIGH (ref 65–99)
Glucose-Capillary: 169 mg/dL — ABNORMAL HIGH (ref 65–99)
Glucose-Capillary: 210 mg/dL — ABNORMAL HIGH (ref 65–99)
Glucose-Capillary: 169 mg/dL — ABNORMAL HIGH (ref 65–99)

## 2015-01-26 LAB — BASIC METABOLIC PANEL
ANION GAP: 9 (ref 5–15)
BUN: 19 mg/dL (ref 6–20)
CALCIUM: 9.4 mg/dL (ref 8.9–10.3)
CO2: 29 mmol/L (ref 22–32)
Chloride: 97 mmol/L — ABNORMAL LOW (ref 101–111)
Creatinine, Ser: 0.48 mg/dL (ref 0.44–1.00)
Glucose, Bld: 206 mg/dL — ABNORMAL HIGH (ref 65–99)
Potassium: 4.2 mmol/L (ref 3.5–5.1)
Sodium: 135 mmol/L (ref 135–145)

## 2015-01-26 LAB — APTT: APTT: 25 s (ref 24–37)

## 2015-01-26 LAB — PROTIME-INR
INR: 1 (ref 0.00–1.49)
PROTHROMBIN TIME: 13.4 s (ref 11.6–15.2)

## 2015-01-26 MED ORDER — VECURONIUM BROMIDE 10 MG IV SOLR
10.0000 mg | Freq: Once | INTRAVENOUS | Status: AC
  Administered 2015-01-27: 10 mg via INTRAVENOUS
  Filled 2015-01-26: qty 10

## 2015-01-26 MED ORDER — VITAL AF 1.2 CAL PO LIQD
1000.0000 mL | ORAL | Status: DC
  Administered 2015-01-27 – 2015-02-02 (×7): 1000 mL
  Filled 2015-01-26 (×14): qty 1000

## 2015-01-26 MED ORDER — ETOMIDATE 2 MG/ML IV SOLN
40.0000 mg | Freq: Once | INTRAVENOUS | Status: AC
  Administered 2015-01-27: 20 mg via INTRAVENOUS
  Filled 2015-01-26: qty 20

## 2015-01-26 MED ORDER — PROPOFOL 500 MG/50ML IV EMUL
5.0000 ug/kg/min | Freq: Once | INTRAVENOUS | Status: DC
  Filled 2015-01-26: qty 50

## 2015-01-26 MED ORDER — FENTANYL CITRATE (PF) 100 MCG/2ML IJ SOLN
200.0000 ug | Freq: Once | INTRAMUSCULAR | Status: AC
  Administered 2015-01-27: 100 ug via INTRAVENOUS
  Filled 2015-01-26: qty 4

## 2015-01-26 MED ORDER — MIDAZOLAM HCL 2 MG/2ML IJ SOLN
4.0000 mg | Freq: Once | INTRAMUSCULAR | Status: AC
  Administered 2015-01-27: 2 mg via INTRAVENOUS
  Filled 2015-01-26: qty 4

## 2015-01-26 NOTE — Progress Notes (Signed)
PULMONARY / CRITICAL CARE MEDICINE   Name: Rachel MinksStephanie N Vang MRN: [id number] DOB: 12/20/1965    ADMISSION DATE:  01/13/2015  REFERRING MD:  EDP  CHIEF COMPLAINT:  AMS  SUBJECTIVE:  No further fever.  VITAL SIGNS: BP 151/97 mmHg  Pulse 68  Temp(Src) 99.1 F (37.3 C) (Core (Comment))  Resp 16  Ht 5\' 5"  (1.651 m)  Wt 163 lb 2.3 oz (74 kg)  BMI 27.15 kg/m2  SpO2 100%  LMP  (LMP Unknown)  HEMODYNAMICS: CVP:  [3 mmHg-8 mmHg] 6 mmHg  VENTILATOR SETTINGS: Vent Mode:  [-] PRVC FiO2 (%):  [0.8 %-30 %] 30 % Set Rate:  [16 bmp-1630 bmp] 1630 bmp Vt Set:  [500 mL] 500 mL PEEP:  [5 cmH20] 5 cmH20 Pressure Support:  [5 cmH20] 5 cmH20 Plateau Pressure:  [14 cmH20-16 cmH20] 14 cmH20  INTAKE / OUTPUT: I/O last 3 completed shifts: In: 7710.1 [I.V.:5240.1; NG/GT:2370; IV Piggyback:100] Out: 6486 [Urine:5320; Drains:166; Stool:1000]  PHYSICAL EXAMINATION: General: ill appearing Neuro: opens eyes spontaneously, withdraws to pain, not following commands HEENT: ETT in place Cardiovascular: regular, no murmur Lungs: scattered rhonchi Abdomen: soft, non tender Musculoskeletal: no edema Skin: no rashes  LABS:  BMET  Recent Labs Lab 01/24/15 0455 01/25/15 0355 01/26/15 0550  NA 137 137 135  K 4.4 3.9 4.2  CL 97* 97* 97*  CO2 30 31 29   BUN 20 21* 19  CREATININE 0.66 0.55 0.48  GLUCOSE 255* 241* 206*   Electrolytes  Recent Labs Lab 01/22/15 0430 01/23/15 0530 01/24/15 0455 01/25/15 0355 01/26/15 0550  CALCIUM 9.4 9.2 9.3 9.4 9.4  MG 2.1 1.9 2.0  --   --   PHOS 4.6 4.3 3.3  --   --    CBC  Recent Labs Lab 01/23/15 0530 01/24/15 0455 01/25/15 0355  WBC 33.7* 35.8* 37.5*  HGB 14.2 14.1 13.5  HCT 43.6 42.3 41.5  PLT 263 261 280   ABG  Recent Labs Lab 01/22/15 0542 01/23/15 0419 01/24/15 0400  PHART 7.396 7.387 7.443  PCO2ART 39.7 44.3 42.7  PO2ART 126.0* 141.0* 154*   Glucose  Recent Labs Lab 01/25/15 0809 01/25/15 1255 01/25/15 1712  01/25/15 1933 01/25/15 2326 01/26/15 0331  GLUCAP 201* 158* 185* 200* 200* 213*   Imaging Dg Chest Port 1 View  01/26/2015  CLINICAL DATA:  Respiratory failure. EXAM: PORTABLE CHEST 1 VIEW COMPARISON:  01/24/2015 FINDINGS: Endotracheal tube is 1.9 cm above the carina. Central line tip in the upper right atrium. Small nodular density in the right upper chest probably related to the ECG lead. Streaky densities in the left lower lung are suggestive for atelectasis. Heart size is normal. Negative for a pneumothorax. Nasogastric tube extends into the abdomen. IMPRESSION: Few densities at the left lung base are suggestive for atelectasis. Otherwise, no focal airspace disease. Support apparatuses as described. Subtle nodular density in the right upper chest is probably related to the overlying support apparatus as described. Recommend attention to this area on follow up imaging. Electronically Signed   By: Richarda OverlieAdam  Henn M.D.   On: 01/26/2015 07:42    STUDIES:  [facility] Echo >> severe LVH, EF 60 to 65%, grade 1 diastolic dysfx  CULTURES: Blood 12/21 >>> coag neg Staph Sputum 12/21 >>> Haemophilus influenzae C diff 12/21 >>> negative  ANTIBIOTICS: 12/15 Cefazolin >> 12/17 Vanc 12/21>>>12/24 Zosyn 12/21>>> 12/27  SIGNIFICANT EVENTS: 12/15 Admit, SAH >> coiling distal ACA aneurysm, IVC drain placed  LINES/TUBES: 12/15 ETT >> 12/15  Rt IJ CVL >>12/26 12/15 IVC >>  12/18 R PICC >> 12/23 12/23 R IJ TLC>>>  DISCUSSION:   49 yo female smoker presented with altered mental status, Rt pupil dilation from HTN emergency (BP 245/194) and SAH.  ASSESSMENT / PLAN:  NEUROLOGIC A:   Acute encephalopathy 2nd to [facility], s/p coiling. Possible seizure prior to admission. P:   IVC drain, decadron, AEDs per neurology Continue nimodipine, zocor NS at 60 ml/hr Continue neo for target SBP of >170 per neuro's recommendations in setting of concern for vasospasm  PULMONARY A: Compromise  airway 2nd to acute encephalopathy. Tobacco abuse. P:   PS trials but no extubation given mental status Family deciding on trach/peg vs comfort care F/u CXR intermittently  CARDIOVASCULAR A:  HTN emergency. Elevated troponin - demand ischemia. P:  Defer cardiology assessment for now  RENAL A:   Lactic acidosis >> resolved. Hypokalemia. P:   Monitor renal fx, urine outpt  GASTROINTESTINAL A:   Nutrition. P:   Tube feeds while on vent Protonix for SUP  HEMATOLOGIC A:   Mild anemia, thrombocytopenia of critical illness >> resolved. P:  F/u CBC intermittently SCDs for DVT prevention  INFECTIOUS A:   Post-IVC placement prophylaxis >> completed 12/17. Tracheo-bronchitis with H influenzae in sputum from 12/21 >> completed Abx 12/27. Coag neg Staph in blood cx from 12/21 >> contaminate. P:   Monitor off Abx  ENDOCRINE A:   Steroid induced hyperglycemia. P:   SSI  Goals of Care >> family deciding about trach/PEG versus transition to comfort care.  She likely would not need vent support long term, but would require trach for airway control.  Coralyn Helling, MD [facility] Pulmonary/Critical Care 01/26/2015, 7:52 AM Pager:  [phone] After 3pm call: [phone]

## 2015-01-26 NOTE — Progress Notes (Signed)
Notified MD of CVP 3. Pt BP supported by neo for goal 170-180. Urine Output more than adequate. Orders given for 1 L NS bolus. Will administer and continue to monitor.  Pt also agitated on vent, coughing, biting tube with bite block in place. Orders given to increase fent infusion ceiling to 29700mcg/hr. Will continue to monitor.

## 2015-01-26 NOTE — Progress Notes (Signed)
Pt IVC bag changed. Tedra CoupeLauren Kiser, RN, Danelle BerryJasmine Jones RN witnessed.

## 2015-01-26 NOTE — Progress Notes (Signed)
Transcranial Doppler  Date POD PCO2 HCT BP  MCA ACA PCA OPHT SIPH VERT Basilar  01/15/15 JE     Right  Left   72  105   -47  -61   29  43   16  17   -54     -32     -32 ?      01-17-15 hc     Right  Left   65  53   -67  -61   30  33   38  25     24     -47     -44    01/19/15 MS     Right  Left   72  81   -45  -77   32  40   24  31   33  *   *  *   *      01/21/15 MS      Right  Left   56  108   -48  -37   61  -21   19  19    *  *   -17  -38   *      01/24/15 MS      Right  Left   112  116   -29  *   29  36   16  22   *  *   *  *   *      01/26/15 JE     Right  Left   96  96   -45  -171   40  49   18  21   29  29                    Right  Left                                        MCA = Middle Cerebral Artery      OPHT = Opthalmic Artery     BASILAR = Basilar Artery   ACA = Anterior Cerebral Artery     SIPH = Carotid Siphon PCA = Posterior Cerebral Artery   VERT = Verterbral Artery                   Normal MCA = 62+\-12 ACA = 50+\-12 PCA = 42+\-23   01/15/15 Unable to insonate left siphon (pt was becoming agitated at this point). Unable to attempt left vertebral due to left neck line. Unsure if basilar mean velocity is accurate due to poor window/ positioning. JE  01/19/15- * Unable to insonate due to poor patient cooperation and poor acoustic window.  01/21/15- * Unable to insonate due to poor patient cooperation.  01/24/15- * Unable to insonate due to poor patient cooperation, bandaging, and patient position.  01/26/15- Unable to obtain bilateral vertebrals and basilar due to poor patient cooperation, bandaging, and patient position.

## 2015-01-26 NOTE — Progress Notes (Signed)
Nutrition Follow-up  DOCUMENTATION CODES:   Not applicable  INTERVENTION:  Decrease Vital AF 1.2 formula to new goal rate of 55 ml/hr via OGT to provide 1584 kcal, 99 grams of protein, and 1069 ml of free water.   Recommend stool bulking agent to aid with the loose stools.  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   Patient is currently intubated on ventilator support MV: 8.9 L/min Temp (24hrs), Avg:99.1 F (37.3 C), Min:98.6 F (37 C), Max:99.7 F (37.6 C) Propofol: none  Pt has been tolerating her tube feeds per RN, however continues to have loose stools. Pt is C.diff negative. Recommend a bulking agent (such as fibercon or similar) to aid in the loose stools. Per MD, family deciding about trach/PEG versus transition to comfort care.  Labs and medications reviewed.   Diet Order:  Diet NPO time specified  Skin:   (MASD on buttocks, incision on head)  Last BM:  12/27- loose and watery, rectal pouch 300 ml  Height:   Ht Readings from Last 1 Encounters:  01/18/15 5' 5"  (1.651 m)    Weight:   Wt Readings from Last 1 Encounters:  01/26/15 163 lb 2.3 oz (74 kg)    Ideal Body Weight:  56.8 kg  BMI:  Body mass index is 27.15 kg/(m^2).  Estimated Nutritional Needs:   Kcal:  1597  Protein:  90-110 grams  Fluid:  > 1.6 L/day  EDUCATION NEEDS:   No education needs identified at this time  Corrin Parker, MS, RD, LDN Pager # [phone] After hours/ weekend pager #  [phone]

## 2015-01-27 ENCOUNTER — Inpatient Hospital Stay (HOSPITAL_COMMUNITY): Payer: Medicaid Other

## 2015-01-27 LAB — GLUCOSE, CAPILLARY
GLUCOSE-CAPILLARY: 131 mg/dL — AB (ref 65–99)
GLUCOSE-CAPILLARY: 136 mg/dL — AB (ref 65–99)
GLUCOSE-CAPILLARY: 165 mg/dL — AB (ref 65–99)
GLUCOSE-CAPILLARY: 208 mg/dL — AB (ref 65–99)
Glucose-Capillary: 169 mg/dL — ABNORMAL HIGH (ref 65–99)
Glucose-Capillary: 187 mg/dL — ABNORMAL HIGH (ref 65–99)

## 2015-01-27 MED ORDER — FENTANYL CITRATE (PF) 100 MCG/2ML IJ SOLN: 100.0000 ug | Freq: Once | INTRAMUSCULAR | Status: AC

## 2015-01-27 MED ORDER — DEXAMETHASONE SODIUM PHOSPHATE 4 MG/ML IJ SOLN
2.0000 mg | Freq: Four times a day (QID) | INTRAMUSCULAR | Status: DC
  Administered 2015-01-27 – 2015-01-31 (×16): 2 mg via INTRAVENOUS
  Filled 2015-01-27 (×16): qty 1

## 2015-01-27 MED ORDER — MIDAZOLAM HCL 2 MG/2ML IJ SOLN
2.0000 mg | Freq: Once | INTRAMUSCULAR | Status: AC
  Administered 2015-01-27: 2 mg via INTRAVENOUS

## 2015-01-27 NOTE — Procedures (Signed)
Percutaneous Tracheostomy Placement  Consent from family.  Patient sedated, paralyzed and position.  Placed on 100% FiO2 and RR matched.  Area cleaned and draped.  Lidocaine/epi injected.  Skin incision done followed by blunt dissection.  Trachea palpated then punctured, catheter passed and visualized bronchoscopically.  Wire placed and visualized.  Catheter removed.  Airway then crushed and dilated.  Size 8 cuffed shiley trach placed and visualized bronchoscopically well above carina.  Good volume returns.  Patient tolerated the procedure well without complications.  Minimal blood loss.  CXR ordered and pending.  Tahjae Durr G. Adalynne Steffensmeier, M.D. [facility] Pulmonary/Critical Care Medicine. Pager: [phone]. After hours pager: [phone].  

## 2015-01-27 NOTE — Procedures (Signed)
Bronchoscopy Procedure Note Rachel Vang [id number] 03/07/1965  Procedure: Bronchoscopy Indications: 49 yo with SAH, VDRF, and failure to wean from vent.  Bronchoscopic assistance for percutaneous tracheostomy insertion.  Procedure Details Consent: Risks of procedure as well as the alternatives and risks of each were explained to the (patient/caregiver).  Consent for procedure obtained. Time Out: Verified patient identification, verified procedure, site/side was marked, verified correct patient position, special equipment/implants available, medications/allergies/relevent history reviewed, required imaging and test results available.  Performed  She was placed on 100% FiO2.  Bronchoscope inserted through ETT.  Carina visualized.  ETT withdrawn to 18 cm at lip.  Directly visualized finder needle >> guidewire >> dilators >> tracheostomy insertion into airway.  No evidence of injury to posterior tracheal wall.  Bronchoscope removed from ETT and placed through tracheostomy which was visualized in good position above the carina.   No immediate complications.  Post-procedure chest xray pending.  Rachel HellingVineet Jahlon Baines, MD Rachel Vang 01/27/2015, 9:46 AM Pager:  [phone] After 3pm call: [phone]

## 2015-01-27 NOTE — Progress Notes (Signed)
Found pt on full vent support post trach insertion.  Attempted to wean on psv, but low min. Volume and low RR noted.  Continued on full vent support for now.

## 2015-01-27 NOTE — Progress Notes (Signed)
01/27/2015- Respiratory care note- Respiratory assisted with trach placement at bedside this am using video bronchoscopy with MD.  Pt placed on full support and 100% oxygen prior to procedure.  Pt tolerated well with #8 shiley placed.  No complications noted.  Pt sedated for procedure and remained on full support post procedure with oxygen weaned to 30%.  Obturator at head of bed along with trach supply list.

## 2015-01-27 NOTE — Progress Notes (Signed)
Pt noted to mismatch with inhaled/exhaled Vt.  Inflated trach cuff multiple times, but not able to maintain volumes.  She is otherwise hemodynamically stable.  Her main issue has been airway protection rather than need for assisted ventilation.  Will transition to trach collar as tolerated.  Coralyn HellingVineet Yeimi Debnam, MD [facility] Pul[facility]/Critical [facility] 01/27/2015, 5:11 PM Pager:  [phone] After 3pm call: [phone]

## 2015-01-27 NOTE — Progress Notes (Signed)
PULMONARY / CRITICAL CARE MEDICINE   Name: Rachel MinksStephanie N Vang MRN: [id number] DOB: 03/01/1965    ADMISSION DATE:  01/13/2015  REFERRING MD:  EDP  CHIEF COMPLAINT:  AMS  SUBJECTIVE:  For trach this AM.  VITAL SIGNS: BP 96/71 mmHg  Pulse 112  Temp(Src) 99.1 F (37.3 C) (Core (Comment))  Resp 16  Ht 5\' 5"  (1.651 m)  Wt 154 lb 8.7 oz (70.1 kg)  BMI 25.72 kg/m2  SpO2 100%  LMP  (LMP Unknown)  HEMODYNAMICS: CVP:  [0 mmHg-9 mmHg] 8 mmHg  VENTILATOR SETTINGS: Vent Mode:  [-] PRVC FiO2 (%):  [30 %] 30 % Set Rate:  [16 bmp] 16 bmp Vt Set:  [500 mL] 500 mL PEEP:  [5 cmH20] 5 cmH20 Plateau Pressure:  [15 cmH20-18 cmH20] 15 cmH20  INTAKE / OUTPUT: I/O last 3 completed shifts: In: 7127.5 [I.V.:5302.5; NG/GT:1825] Out: 6824 [Urine:6075; Drains:174; Stool:575]  PHYSICAL EXAMINATION: General: ill appearing Neuro: opens eyes spontaneously, withdraws to pain, not following commands HEENT: ETT in place Cardiovascular: regular, no murmur Lungs: scattered rhonchi Abdomen: soft, non tender Musculoskeletal: no edema Skin: no rashes  LABS:  BMET  Recent Labs Lab 01/24/15 0455 01/25/15 0355 01/26/15 0550  NA 137 137 135  K 4.4 3.9 4.2  CL 97* 97* 97*  CO2 30 31 29   BUN 20 21* 19  CREATININE 0.66 0.55 0.48  GLUCOSE 255* 241* 206*   Electrolytes  Recent Labs Lab 01/22/15 0430 01/23/15 0530 01/24/15 0455 01/25/15 0355 01/26/15 0550  CALCIUM 9.4 9.2 9.3 9.4 9.4  MG 2.1 1.9 2.0  --   --   PHOS 4.6 4.3 3.3  --   --    CBC  Recent Labs Lab 01/23/15 0530 01/24/15 0455 01/25/15 0355  WBC 33.7* 35.8* 37.5*  HGB 14.2 14.1 13.5  HCT 43.6 42.3 41.5  PLT 263 261 280   ABG  Recent Labs Lab 01/22/15 0542 01/23/15 0419 01/24/15 0400  PHART 7.396 7.387 7.443  PCO2ART 39.7 44.3 42.7  PO2ART 126.0* 141.0* 154*   Glucose  Recent Labs Lab 01/26/15 1154 01/26/15 1522 01/26/15 1934 01/26/15 2353 01/27/15 0346 01/27/15 0801  GLUCAP 169* 210* 169* 208*  131* 136*   Imaging No results found.  STUDIES:  [facility] Echo >> severe LVH, EF 60 to 65%, grade 1 diastolic dysfx  CULTURES: Blood 12/21 >>> coag neg Staph Sputum 12/21 >>> Haemophilus influenzae C diff 12/21 >>> negative  ANTIBIOTICS: 12/15 Cefazolin >> 12/17 Vanc 12/21>>>12/24 Zosyn 12/21>>> 12/27  SIGNIFICANT EVENTS: 12/15 Admit, SAH >> coiling distal ACA aneurysm, IVC drain placed  LINES/TUBES: 12/15 ETT >> 12/15 Rt IJ CVL >>12/26 12/15 IVC >>  12/18 R PICC >> 12/23 12/23 R IJ TLC>>>  DISCUSSION:   49 yo female smoker presented with altered mental status, Rt pupil dilation from HTN emergency (BP 245/194) and SAH.  ASSESSMENT / PLAN:  NEUROLOGIC A:   [facility] s/p [facility]. Possible seizure prior to admission. P:   IVC drain, decadron, AEDs per neurology Continue nimodipine, zocor NS at 50 ml/hr Continue neo for target SBP of >170 per neuro's recommendations in setting of concern for vasospasm  PULMONARY A: Compromise airway 2nd to acute encephalopathy. Tobacco abuse. P:   Trach 12/29 >> likely can wean off vent quickly afterward F/u CXR intermittently  CARDIOVASCULAR A:  HTN emergency. Elevated troponin - demand ischemia. P:  Defer cardiology assessment for now  RENAL A:   Lactic acidosis >> resolved. Hypokalemia.  P:   Monitor renal fx, urine outpt  GASTROINTESTINAL A:   Nutrition. P:   Tube feeds while on vent Protonix for SUP  HEMATOLOGIC A:   Mild anemia, thrombocytopenia of critical illness >> resolved. P:  F/u CBC intermittently SCDs for DVT prevention  INFECTIOUS A:   Post-IVC placement prophylaxis >> completed 12/17. Tracheo-bronchitis with H influenzae in sputum from 12/21 >> completed Abx 12/27. Coag neg Staph in blood cx from 12/21 >> contaminate. P:   Monitor off Abx  ENDOCRINE A:   Steroid induced hyperglycemia. P:   SSI    Coralyn Helling, MD [facility]  Pulmonary/Critical Care 01/27/2015, 9:39 AM Pager:  [phone] After 3pm call: [phone]

## 2015-01-27 NOTE — Progress Notes (Signed)
Trach  Cuff requires pressure=56 to maintain seal.  Dr Molli KnockYacoub and RN aware.

## 2015-01-27 NOTE — Progress Notes (Signed)
S/p trach AVSS Eyes open spontaneously Withdraws EVD working Stable Raise drain to 10 cm H2O Taper steroids over the next 10 days, will reduce to 2q6 today

## 2015-01-28 ENCOUNTER — Inpatient Hospital Stay (HOSPITAL_COMMUNITY): Payer: Medicaid Other

## 2015-01-28 ENCOUNTER — Ambulatory Visit (HOSPITAL_COMMUNITY): Payer: Medicaid Other | Attending: Neurosurgery

## 2015-01-28 DIAGNOSIS — I609 Nontraumatic subarachnoid hemorrhage, unspecified: Secondary | ICD-10-CM | POA: Insufficient documentation

## 2015-01-28 LAB — CBC
HCT: 37.3 % (ref 36.0–46.0)
Hemoglobin: 12.4 g/dL (ref 12.0–15.0)
MCH: 31.2 pg (ref 26.0–34.0)
MCHC: 33.2 g/dL (ref 30.0–36.0)
MCV: 93.7 fL (ref 78.0–100.0)
Platelets: 259 10*3/uL (ref 150–400)
RBC: 3.98 MIL/uL (ref 3.87–5.11)
RDW: 14.6 % (ref 11.5–15.5)
WBC: 23.2 10*3/uL — AB (ref 4.0–10.5)

## 2015-01-28 LAB — GLUCOSE, CAPILLARY
GLUCOSE-CAPILLARY: 215 mg/dL — AB (ref 65–99)
GLUCOSE-CAPILLARY: 182 mg/dL — AB (ref 65–99)
GLUCOSE-CAPILLARY: 190 mg/dL — AB (ref 65–99)
GLUCOSE-CAPILLARY: 131 mg/dL — AB (ref 65–99)
GLUCOSE-CAPILLARY: 144 mg/dL — AB (ref 65–99)
Glucose-Capillary: 174 mg/dL — ABNORMAL HIGH (ref 65–99)
Glucose-Capillary: 180 mg/dL — ABNORMAL HIGH (ref 65–99)

## 2015-01-28 LAB — BASIC METABOLIC PANEL
Anion gap: 9 (ref 5–15)
BUN: 23 mg/dL — AB (ref 6–20)
CHLORIDE: 100 mmol/L — AB (ref 101–111)
CO2: 26 mmol/L (ref 22–32)
CREATININE: 0.62 mg/dL (ref 0.44–1.00)
Calcium: 9.2 mg/dL (ref 8.9–10.3)
GFR calc Af Amer: >60 mL/min (ref >60)
GFR calc non Af Amer: >60 mL/min (ref >60)
Glucose, Bld: 237 mg/dL — ABNORMAL HIGH (ref 65–99)
POTASSIUM: 4 mmol/L (ref 3.5–5.1)
SODIUM: 135 mmol/L (ref 135–145)

## 2015-01-28 MED ORDER — CHLORHEXIDINE GLUCONATE 0.12 % MT SOLN
15.0000 mL | Freq: Two times a day (BID) | OROMUCOSAL | Status: DC
  Administered 2015-01-28 – 2015-02-14 (×37): 15 mL via OROMUCOSAL
  Filled 2015-01-28 (×27): qty 15

## 2015-01-28 MED ORDER — CETYLPYRIDINIUM CHLORIDE 0.05 % MT LIQD
7.0000 mL | Freq: Two times a day (BID) | OROMUCOSAL | Status: DC
  Administered 2015-01-28 – 2015-02-13 (×34): 7 mL via OROMUCOSAL

## 2015-01-28 MED ORDER — BETHANECHOL CHLORIDE 10 MG PO TABS
10.0000 mg | ORAL_TABLET | Freq: Three times a day (TID) | ORAL | Status: DC
  Administered 2015-01-28 – 2015-02-14 (×51): 10 mg via ORAL
  Filled 2015-01-28 (×55): qty 1

## 2015-01-28 MED ORDER — PRO-STAT SUGAR FREE PO LIQD
30.0000 mL | Freq: Every day | ORAL | Status: DC
  Administered 2015-01-28 – 2015-02-03 (×7): 30 mL
  Filled 2015-01-28 (×7): qty 30

## 2015-01-28 NOTE — Progress Notes (Signed)
Nutrition Follow-up  INTERVENTION:   Continue Vital AF 1.2 @ 55 ml/hr Add 30 ml Prostat daily  Provides: 1684 kcal, 114 grams protein, and 1069 ml H2O.   NUTRITION DIAGNOSIS:   Inadequate oral intake related to inability to eat as evidenced by NPO status. Ongoing.   GOAL:   Patient will meet greater than or equal to 90% of their needs Met.   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   12/29 trach placed and has been on trach collar since, cortrak tube placed (tip in gastric antrum) Medications reviewed and include: decadron, lantus, novolog, imodium Labs reviewed: CBG's: 182-215 Weight stable.  Pt discussed during ICU rounds and with RN.  Ventric drain still in place.   Diet Order:    NPO  Skin:  Wound (see comment) (MASD on buttocks)  Last BM:  12/29 300 ml and unmeasured stool via rectal tube (placed 12/22)  Height:   Ht Readings from Last 1 Encounters:  01/18/15 5' 5"  (1.651 m)   Weight:   Wt Readings from Last 1 Encounters:  01/28/15 158 lb 8.2 oz (71.9 kg)   Ideal Body Weight:  56.8 kg  BMI:  Body mass index is 26.38 kg/(m^2).  Estimated Nutritional Needs:   Kcal:  1600-1800  Protein:  85-100 grams  Fluid:  > 1.6 L/day  EDUCATION NEEDS:   No education needs identified at this time  [place], [place], [place] Pager [phone] After Hours Pager

## 2015-01-28 NOTE — Progress Notes (Signed)
Transcranial Doppler  Date POD PCO2 HCT BP  MCA ACA PCA OPHT SIPH VERT Basilar  01/15/15 JE     Right  Left   72  105   -47  -61   29  43   16  17   -54     -32     -32 ?      01-17-15 hc     Right  Left   65  53   -67  -61   30  33   38  25     24     -47     -44    01/19/15 MS     Right  Left   72  81   -45  -77   32  40   24  31   33  *   *  *   *      01/21/15 MS      Right  Left   56  108   -48  -37   61  -21   19  19    *  *   -17  -38   *      01/24/15 MS      Right  Left   112  116   -29  *   29  36   16  22   *  *   *  *   *      01/26/15 JE     Right  Left   96  96   -45  -171   40  49   18  21   29  29               01/28/15 MS     Right  Left   58  70   67  *   51  20   23  28    *  *   *  *   *       MCA = Middle Cerebral Artery      OPHT = Opthalmic Artery     BASILAR = Basilar Artery   ACA = Anterior Cerebral Artery     SIPH = Carotid Siphon PCA = Posterior Cerebral Artery   VERT = Verterbral Artery                   Normal MCA = 62+\-12 ACA = 50+\-12 PCA = 42+\-23   01/15/15 Unable to insonate left siphon (pt was becoming agitated at this point). Unable to attempt left vertebral due to left neck line. Unsure if basilar mean velocity is accurate due to poor window/ positioning. JE  01/19/15- * Unable to insonate due to poor patient cooperation and poor acoustic window.  01/21/15- * Unable to insonate due to poor patient cooperation.  01/24/15- * Unable to insonate due to poor patient cooperation, bandaging, and patient position.  01/26/15- Unable to obtain bilateral vertebrals and basilar due to poor patient cooperation, bandaging, and patient position.  01/28/15-*Unable to insonate due to poor patient cooperation, bandaging, and patient position.

## 2015-01-28 NOTE — Progress Notes (Signed)
Orthopedic Tech Progress Note Patient Details:  Rachel Vang 04/25/1965 [id number]  Patient ID: Rachel Vang, female   DOB: 10/31/1965, 49 y.o.   MRN: [id number] Called in bio-tech brace order; spoke with Rachel MaltaStephanie  Kobi Vang 01/28/2015, 9:05 AM

## 2015-01-28 NOTE — Progress Notes (Signed)
PULMONARY / CRITICAL CARE MEDICINE   Name: Rachel MinksStephanie N Vang MRN: [id number] DOB: 04/09/1965    ADMISSION DATE:  01/13/2015  REFERRING MD:  EDP  CHIEF COMPLAINT:  AMS  SUBJECTIVE:  Fever over night.  Not moving as much.  VITAL SIGNS: BP 172/101 mmHg  Pulse 107  Temp(Src) 100.9 F (38.3 C) (Core (Comment))  Resp 26  Ht 5\' 5"  (1.651 m)  Wt 158 lb 8.2 oz (71.9 kg)  BMI 26.38 kg/m2  SpO2 100%  LMP  (LMP Unknown)  VENTILATOR SETTINGS: Vent Mode:  [-] PRVC FiO2 (%):  [30 %-40 %] 40 % Set Rate:  [16 bmp] 16 bmp Vt Set:  [500 mL] 500 mL PEEP:  [5 cmH20] 5 cmH20  INTAKE / OUTPUT: I/O last 3 completed shifts: In: 6371.6 [I.V.:4971.6; NG/GT:1400] Out: 5129 [Urine:4550; Drains:129; Stool:450]  PHYSICAL EXAMINATION: General: ill appearing Neuro: opens eyes spontaneously, withdraws to pain, not following commands HEENT: trach site clean Cardiovascular: regular, no murmur Lungs: scattered rhonchi Abdomen: soft, non tender Musculoskeletal: no edema Skin: no rashes  LABS:  BMET  Recent Labs Lab 01/25/15 0355 01/26/15 0550 01/28/15 0500  NA 137 135 135  K 3.9 4.2 4.0  CL 97* 97* 100*  CO2 31 29 26   BUN 21* 19 23*  CREATININE 0.55 0.48 0.62  GLUCOSE 241* 206* 237*   Electrolytes  Recent Labs Lab 01/22/15 0430 01/23/15 0530 01/24/15 0455 01/25/15 0355 01/26/15 0550 01/28/15 0500  CALCIUM 9.4 9.2 9.3 9.4 9.4 9.2  MG 2.1 1.9 2.0  --   --   --   PHOS 4.6 4.3 3.3  --   --   --    CBC  Recent Labs Lab 01/24/15 0455 01/25/15 0355 01/28/15 0500  WBC 35.8* 37.5* 23.2*  HGB 14.1 13.5 12.4  HCT 42.3 41.5 37.3  PLT 261 280 259   ABG  Recent Labs Lab 01/22/15 0542 01/23/15 0419 01/24/15 0400  PHART 7.396 7.387 7.443  PCO2ART 39.7 44.3 42.7  PO2ART 126.0* 141.0* 154*   Glucose  Recent Labs Lab 01/27/15 1208 01/27/15 1522 01/27/15 1923 01/27/15 2331 01/28/15 0323 01/28/15 0828  GLUCAP 165* 169* 187* 180* 215* 182*   Imaging Ct Head Wo  Contrast  01/28/2015  CLINICAL DATA:  Followup acute subarachnoid hemorrhage and coiling of anterior cerebral artery aneurysm EXAM: CT HEAD WITHOUT CONTRAST TECHNIQUE: Contiguous axial images were obtained from the base of the skull through the vertex without intravenous contrast. COMPARISON:  01/13/2015 FINDINGS: Ventriculostomy from a right frontal approach enters the frontal horn of the right lateral ventricle in has its tip in the region of the foramen of Monro. Ventricles are mildly larger than were seen on the presenting scan. Coils are present at the location of the anterior communicating artery aneurysm. Anterior interhemispheric blood collide is becoming smaller and less dense. Diffuse subarachnoid hemorrhage is becoming less dense. No definable hyper acute hemorrhage. No convexity subdural or effusion. Layering fluid is present in the sphenoid sinus. No CT evidence of ischemic infarction. IMPRESSION: Previous coiling of anterior communicating artery aneurysm. Right frontal ventriculostomy. Ventricular size is slightly larger than on the presenting examination. Diminishing density of diffuse subarachnoid blood and and interhemispheric hematoma anteriorly. Electronically Signed   By: Paulina FusiMark  Shogry M.D.   On: 01/28/2015 07:30   Dg Chest Port 1 View  01/28/2015  CLINICAL DATA:  Respiratory failure. EXAM: PORTABLE CHEST 1 VIEW COMPARISON:  01/27/2015 FINDINGS: Again noted is a tracheostomy tube. Jugular central line tip is in the upper right  atrium. Feeding tube extends into the abdomen. There is mild peribronchial thickening with increased densities at the lung bases. Slightly low lung volumes. Heart size is stable. IMPRESSION: Low lung volumes with mild peribronchial thickening. Cannot exclude mild interstitial edema. Support apparatuses as described. Electronically Signed   By: Richarda Overlie M.D.   On: 01/28/2015 08:04   Dg Chest Port 1 View  01/27/2015  CLINICAL DATA:  New tracheostomy EXAM: PORTABLE  CHEST 1 VIEW COMPARISON:  Chest radiograph from one day prior. FINDINGS: Tracheostomy tube tip overlies the tracheal air column just below the thoracic inlet. Enteric tube enters the stomach with the tip not seen on this image. Right internal jugular central venous catheter terminates at the cavoatrial junction. Stable cardiomediastinal silhouette with normal heart size. No pneumothorax. No pleural effusion. Stable mild left basilar atelectasis. No pulmonary edema. No new lung opacity. IMPRESSION: 1. Well-positioned tracheostomy tube. 2. Mild left basilar atelectasis. Otherwise no active cardiopulmonary disease. Electronically Signed   By: Delbert Phenix M.D.   On: 01/27/2015 11:33   Dg Abd Portable 1v  01/27/2015  CLINICAL DATA:  Patient status post feeding tube placement. EXAM: PORTABLE ABDOMEN - 1 VIEW COMPARISON:  Abdominal radiograph 01/13/2015. FINDINGS: Feeding tube is present with tip projecting at the gastric antrum. Gas is demonstrated throughout the colon. No definite evidence for free intraperitoneal air. Cardiomegaly. Focal consolidative opacity within the left lung base. IMPRESSION: Feeding tube tip projects at the gastric antrum. Focal nodular consolidative opacity within the left lung base potentially secondary to an infectious process or atelectasis. Continued radiographic follow-up is recommended to ensure resolution. Electronically Signed   By: Annia Belt M.D.   On: 01/27/2015 11:39    STUDIES:  12/15 CT head >> [facility] 12/16 Echo >> severe LVH, EF 60 to 65%, grade 1 diastolic dysfx  CULTURES: Blood 12/21 >>> coag neg Staph Sputum 12/21 >>> Haemophilus influenzae C diff 12/21 >>> negative  ANTIBIOTICS: 12/15 Cefazolin >> 12/17 Vanc 12/21>>>12/24 Zosyn 12/21>>> 12/27  SIGNIFICANT EVENTS: 12/15 Admit, SAH >> coiling distal ACA aneurysm, IVC drain placed 12/30 Fever, not moving as much  LINES/TUBES: 12/15 ETT >> 12/29 12/15 Rt IJ CVL >>12/26 12/15 IVC >>  12/18 R PICC >>  12/23 12/23 R IJ TLC>>> 12/29 Trach Ninetta Lights) >>  DISCUSSION:   49 yo female smoker presented with altered mental status, Rt pupil dilation from HTN emergency (BP 245/194) and SAH.  ASSESSMENT / PLAN:  NEUROLOGIC A:   Acute encephalopathy 2nd to [facility], s/p coiling >> mental status worse 12/30. Possible seizure prior to admission. P:   IVC drain, AEDs per neurolosurgery Wean off decadron per neurosurgery Continue nimodipine, zocor NS at 150 ml/hr Goal MAP > 80  PULMONARY A: Compromise airway 2nd to acute encephalopathy. Tobacco abuse. P:   Trach collar as tolerated F/u CXR intermittently  CARDIOVASCULAR A:  HTN emergency. Elevated troponin - demand ischemia. P:  Defer cardiology assessment for now  RENAL A:   Lactic acidosis >> resolved. Hypokalemia >> improved. Urine retention. P:   Monitor renal fx, urine outpt Keep foley in for now Add bethanechol  GASTROINTESTINAL A:   Nutrition. P:   Tube feeds while on vent Protonix for SUP  HEMATOLOGIC A:   Mild anemia, thrombocytopenia of critical illness >> resolved. P:  F/u CBC intermittently SCDs for DVT prevention  INFECTIOUS A:   Post-IVC placement prophylaxis >> completed 12/17. Tracheobronchitis with H influenzae in sputum from 12/21 >> completed Abx 12/27. Coag neg Staph in blood cx from  12/21 >> contaminate. Fever 12/30. P:   If fever recurs, then repeat cx's and consider restarting Abx  ENDOCRINE A:   Steroid induced hyperglycemia. P:   SSI    Coralyn Helling, MD [facility] Pulmonary/Critical Care 01/28/2015, 8:53 AM Pager:  [phone] After 3pm call: [phone]

## 2015-01-28 NOTE — Progress Notes (Signed)
CT reviewed Less blood Ventricles slightly larger Continue draining at popoff 0

## 2015-01-28 NOTE — Progress Notes (Signed)
Spoke with Dr. Bevely Palmeritty about patient not moving left side as well as previous exam. Order to return IVC drain to 0 and go for stat head CT. Will do so.

## 2015-01-28 NOTE — Progress Notes (Signed)
Patient transported from 3M12 to CT and back with no complications. 

## 2015-01-29 LAB — BASIC METABOLIC PANEL
Anion gap: 5 (ref 5–15)
BUN: 18 mg/dL (ref 6–20)
CHLORIDE: 100 mmol/L — AB (ref 101–111)
CO2: 29 mmol/L (ref 22–32)
CREATININE: 0.51 mg/dL (ref 0.44–1.00)
Calcium: 9.1 mg/dL (ref 8.9–10.3)
GFR calc Af Amer: >60 mL/min (ref >60)
GFR calc non Af Amer: >60 mL/min (ref >60)
GLUCOSE: 183 mg/dL — AB (ref 65–99)
Potassium: 4.1 mmol/L (ref 3.5–5.1)
SODIUM: 134 mmol/L — AB (ref 135–145)

## 2015-01-29 LAB — GLUCOSE, CAPILLARY
GLUCOSE-CAPILLARY: 135 mg/dL — AB (ref 65–99)
Glucose-Capillary: 137 mg/dL — ABNORMAL HIGH (ref 65–99)
Glucose-Capillary: 149 mg/dL — ABNORMAL HIGH (ref 65–99)
Glucose-Capillary: 112 mg/dL — ABNORMAL HIGH (ref 65–99)
Glucose-Capillary: 155 mg/dL — ABNORMAL HIGH (ref 65–99)
Glucose-Capillary: 106 mg/dL — ABNORMAL HIGH (ref 65–99)

## 2015-01-29 LAB — CBC
HCT: 34.2 % — ABNORMAL LOW (ref 36.0–46.0)
Hemoglobin: 11.1 g/dL — ABNORMAL LOW (ref 12.0–15.0)
MCH: 30.6 pg (ref 26.0–34.0)
MCHC: 32.5 g/dL (ref 30.0–36.0)
MCV: 94.2 fL (ref 78.0–100.0)
PLATELETS: 261 10*3/uL (ref 150–400)
RBC: 3.63 MIL/uL — ABNORMAL LOW (ref 3.87–5.11)
RDW: 14.5 % (ref 11.5–15.5)
WBC: 21.6 10*3/uL — AB (ref 4.0–10.5)

## 2015-01-29 MED ORDER — FENTANYL CITRATE (PF) 100 MCG/2ML IJ SOLN
50.0000 ug | INTRAMUSCULAR | Status: DC | PRN
  Administered 2015-01-29 – 2015-02-07 (×34): 100 ug via INTRAVENOUS
  Filled 2015-01-29 (×36): qty 2

## 2015-01-29 NOTE — Progress Notes (Signed)
Wasted 250 cc Fentanyl drip. Witness by Babette Relicammy, RN.

## 2015-01-29 NOTE — Progress Notes (Signed)
Per Dr. Craige CottaSood continue foley catheter until Monday.

## 2015-01-29 NOTE — Progress Notes (Signed)
Overall stable. Patient awakens to voice. Appears aware. Still not following commands. Ventricular drain output 5-10 mL/h. Fluid straw-colored.  Overall stable. Continue supportive efforts and external ventricular drainage. Decision regarding continued external drainage versus VP shunting deferred to Dr. Bevely Palmeritty next week.

## 2015-01-29 NOTE — Progress Notes (Signed)
PULMONARY / CRITICAL CARE MEDICINE   Name: Rachel Vang MRN: [id number] DOB: December 26, 1965    ADMISSION DATE:  01/13/2015  REFERRING MD:  EDP  CHIEF COMPLAINT:  AMS  SUBJECTIVE:  No further fever.  RN reports pt tracking better.  VITAL SIGNS: BP 129/86 mmHg  Pulse 106  Temp(Src) 100 F (37.8 C) (Core (Comment))  Resp 17  Ht  (1.651 m)  Wt 158 lb 11.7 oz (72 kg)  BMI 26.41 kg/m2  SpO2 99%  LMP  (LMP Unknown)  VENTILATOR SETTINGS: Vent Mode:  [-]  FiO2 (%):  [28 %-40 %] 28 %  INTAKE / OUTPUT: I/O last 3 completed shifts: In: 7332.4 [I.V.:5407.4; NG/GT:1925] Out: 5335 [Urine:4625; Drains:210; Stool:500]  PHYSICAL EXAMINATION: General: ill appearing Neuro: opens eyes spontaneously, withdraws to pain, not following commands HEENT: trach site clean Cardiovascular: regular, no murmur Lungs: scattered rhonchi Abdomen: soft, non tender Musculoskeletal: no edema Skin: no rashes  LABS:  BMET  Recent Labs Lab 01/26/15 0550 01/28/15 0500 01/29/15 0620  NA 135 135 134*  K 4.2 4.0 4.1  CL 97* 100* 100*  CO2 BUN 19 23* 18  CREATININE 0.48 0.62 0.51  GLUCOSE 206* 237* 183*   Electrolytes  Recent Labs Lab 01/23/15 0530 01/24/15 0455  01/26/15 0550 01/28/15 0500 01/29/15 0620  CALCIUM 9.2 9.3  < > 9.4 9.2 9.1  MG 1.9 2.0  --   --   --   --   PHOS 4.3 3.3  --   --   --   --   < > = values in this interval not displayed.   CBC  Recent Labs Lab 01/25/15 0355 01/28/15 0500 01/29/15 0620  WBC 37.5* 23.2* 21.6*  HGB 13.5 12.4 11.1*  HCT 41.5 37.3 34.2*  PLT 280 259 261   ABG  Recent Labs Lab 01/23/15 0419 01/24/15 0400  PHART 7.387 7.443  PCO2ART 44.3 42.7  PO2ART 141.0* 154*   Glucose  Recent Labs Lab 01/28/15 0828 01/28/15 1224 01/28/15 1549 01/28/15 1950 01/28/15 2324 01/29/15 0343  GLUCAP 182* 190* 174* 131* 144* 135*   Imaging No results found.  STUDIES:  12/15 CT head >> [facility] 12/16 Echo >> severe LVH, EF  60 to 65%, grade 1 diastolic dysfx  CULTURES: Blood 12/21 >>> coag neg Staph Sputum 12/21 >>> Haemophilus influenzae C diff 12/21 >>> negative  ANTIBIOTICS: 12/15 Cefazolin >> 12/17 Vanc 12/21>>>12/24 Zosyn 12/21>>> 12/27  SIGNIFICANT EVENTS: 12/15 Admit, SAH >> coiling distal ACA aneurysm, IVC drain placed 12/30 Fever, not moving as much  LINES/TUBES: 12/15 ETT >> 12/29 12/15 Rt IJ CVL >>12/26 12/15 IVC >>  12/18 R PICC >> 12/23 12/23 R IJ TLC>>> 12/29 Trach Ninetta Lights) >>  DISCUSSION:   49 yo female smoker presented with altered mental status, Rt pupil dilation from HTN emergency (BP 245/194) and SAH.  ASSESSMENT / PLAN:  NEUROLOGIC A:   Acute encephalopathy 2nd to [facility], s/p coiling >> mental status worse 12/30. Possible seizure prior to admission. P:   IVC drain, AEDs per neurolosurgery Wean off decadron per neurosurgery Continue nimodipine, zocor NS at 150 ml/hr Goal MAP > 80  PULMONARY A: Compromise airway 2nd to acute encephalopathy. Tobacco abuse. P:   Trach collar as tolerated F/u CXR intermittently  CARDIOVASCULAR A:  HTN emergency. Elevated troponin - demand ischemia. P:  Defer cardiology assessment for now  RENAL A:   Lactic acidosis >> resolved. Hypokalemia >> improved. Urine retention. P:   Monitor renal fx,  urine outpt Keep foley in for now >> plan to d/c on 1/02 Add bethanechol  GASTROINTESTINAL A:   Nutrition. P:   Tube feeds while on vent Protonix for SUP  HEMATOLOGIC A:   Mild anemia, thrombocytopenia of critical illness >> resolved. P:  F/u CBC intermittently SCDs for DVT prevention  INFECTIOUS A:   Post-IVC placement prophylaxis >> completed 12/17. Tracheobronchitis with H influenzae in sputum from 12/21 >> completed Abx 12/27. Coag neg Staph in blood cx from 12/21 >> contaminate. Fever 12/30. P:   If fever recurs, then repeat cx's and consider restarting Abx  ENDOCRINE A:   Steroid induced hyperglycemia. P:    SSI    Coralyn HellingVineet Matthew Cina, MD [facility] Pulmonary/Critical Care 01/29/2015, 7:18 AM Pager:  [phone] After 3pm call: [phone]

## 2015-01-30 LAB — CBC
HCT: 30.8 % — ABNORMAL LOW (ref 36.0–46.0)
Hemoglobin: 10.1 g/dL — ABNORMAL LOW (ref 12.0–15.0)
MCH: 31.1 pg (ref 26.0–34.0)
MCHC: 32.8 g/dL (ref 30.0–36.0)
MCV: 94.8 fL (ref 78.0–100.0)
PLATELETS: 230 10*3/uL (ref 150–400)
RBC: 3.25 MIL/uL — ABNORMAL LOW (ref 3.87–5.11)
RDW: 14.6 % (ref 11.5–15.5)
WBC: 20.7 10*3/uL — AB (ref 4.0–10.5)

## 2015-01-30 LAB — BASIC METABOLIC PANEL
Anion gap: 10 (ref 5–15)
BUN: 20 mg/dL (ref 6–20)
CHLORIDE: 100 mmol/L — AB (ref 101–111)
CO2: 27 mmol/L (ref 22–32)
CREATININE: 0.59 mg/dL (ref 0.44–1.00)
Calcium: 9.4 mg/dL (ref 8.9–10.3)
GFR calc Af Amer: >60 mL/min (ref >60)
GFR calc non Af Amer: >60 mL/min (ref >60)
Glucose, Bld: 196 mg/dL — ABNORMAL HIGH (ref 65–99)
Potassium: 3.8 mmol/L (ref 3.5–5.1)
SODIUM: 137 mmol/L (ref 135–145)

## 2015-01-30 LAB — GLUCOSE, CAPILLARY
GLUCOSE-CAPILLARY: 125 mg/dL — AB (ref 65–99)
GLUCOSE-CAPILLARY: 133 mg/dL — AB (ref 65–99)
Glucose-Capillary: 160 mg/dL — ABNORMAL HIGH (ref 65–99)
Glucose-Capillary: 124 mg/dL — ABNORMAL HIGH (ref 65–99)
Glucose-Capillary: 168 mg/dL — ABNORMAL HIGH (ref 65–99)
Glucose-Capillary: 116 mg/dL — ABNORMAL HIGH (ref 65–99)

## 2015-01-30 NOTE — Progress Notes (Signed)
Subjective: Patient reports opens eyes, tracks, not following commands  Objective: Vital signs in last 24 hours: Temp:  [98.1 F (36.7 C)-99.9 F (37.7 C)] 99.1 F (37.3 C) (01/01 0900) Pulse Rate:  [99-133] 99 (01/01 0900) Resp:  [15-29] 20 (01/01 0900) BP: (118-166)/(71-105) 127/86 mmHg (01/01 0900) SpO2:  [92 %-100 %] 100 % (01/01 0900) FiO2 (%):  [28 %] 28 % (01/01 0800) Weight:  [70.3 kg (154 lb 15.7 oz)] 70.3 kg (154 lb 15.7 oz) (01/01 0400)  Intake/Output from previous day: 12/31 0701 - 01/01 0700 In: 5141 [I.V.:3766; NG/GT:1375] Out: 5091 [Urine:4340; Drains:151; Stool:600] Intake/Output this shift: Total I/O In: 410 [I.V.:300; NG/GT:110] Out: 510 [Urine:500; Drains:10]  Physical Exam: Weak right arm, withdraws all other extremities.  Not following commands.  IVC draining well.  Lab Results:  Recent Labs  01/29/15 0620 01/30/15 0450  WBC 21.6* 20.7*  HGB 11.1* 10.1*  HCT 34.2* 30.8*  PLT 261 230   BMET  Recent Labs  01/29/15 0620 01/30/15 0450  NA 134* 137  K 4.1 3.8  CL 100* 100*  CO2 29 27  GLUCOSE 183* 196*  BUN 18 20  CREATININE 0.51 0.59  CALCIUM 9.1 9.4    Studies/Results: No results found.  Assessment/Plan: IVC draining well up to 15 cc per hour.  May require shunt.  Continue IVC at 0 cm.    LOS: 17 days    Dorian HeckleSTERN,Concetta Guion D, MD 01/30/2015, 9:31 AM

## 2015-01-30 NOTE — Progress Notes (Signed)
PULMONARY / CRITICAL CARE MEDICINE   Name: Lucienne MinksStephanie N Stahnke MRN: [id number] DOB: 06/07/1965    ADMISSION DATE:  01/13/2015  REFERRING MD:  EDP  CHIEF COMPLAINT:  AMS  SUBJECTIVE:  Intermittently receiving fentanyl for tachycardia.  VITAL SIGNS: BP 132/78 mmHg  Pulse 107  Temp(Src) 99.1 F (37.3 C) (Core (Comment))  Resp 20  Ht 5\' 5"  (1.651 m)  Wt 154 lb 15.7 oz (70.3 kg)  BMI 25.79 kg/m2  SpO2 98%  LMP  (LMP Unknown)  VENTILATOR SETTINGS: Vent Mode:  [-]  FiO2 (%):  [28 %] 28 %  INTAKE / OUTPUT: I/O last 3 completed shifts: In: 7478.5 [I.V.:5498.5; NG/GT:1980] Out: 7281 [Urine:6290; Drains:241; Stool:750]  PHYSICAL EXAMINATION: General: ill appearing Neuro: opens eyes spontaneously, withdraws to pain, not following commands HEENT: trach site clean Cardiovascular: regular, no murmur Lungs: scattered rhonchi Abdomen: soft, non tender Musculoskeletal: no edema Skin: no rashes  LABS:  BMET  Recent Labs Lab 01/28/15 0500 01/29/15 0620 01/30/15 0450  NA 135 134* 137  K 4.0 4.1 3.8  CL 100* 100* 100*  CO2 26 29 27   BUN 23* 18 20  CREATININE 0.62 0.51 0.59  GLUCOSE 237* 183* 196*   Electrolytes  Recent Labs Lab 01/24/15 0455  01/28/15 0500 01/29/15 0620 01/30/15 0450  CALCIUM 9.3  < > 9.2 9.1 9.4  MG 2.0  --   --   --   --   PHOS 3.3  --   --   --   --   < > = values in this interval not displayed.   CBC  Recent Labs Lab 01/28/15 0500 01/29/15 0620 01/30/15 0450  WBC 23.2* 21.6* 20.7*  HGB 12.4 11.1* 10.1*  HCT 37.3 34.2* 30.8*  PLT 259 261 230   ABG  Recent Labs Lab 01/24/15 0400  PHART 7.443  PCO2ART 42.7  PO2ART 154*   Glucose  Recent Labs Lab 01/29/15 1118 01/29/15 1540 01/29/15 1914 01/29/15 2309 01/30/15 0355 01/30/15 0735  GLUCAP 149* 112* 155* 106* 160* 124*   Imaging No results found.  STUDIES:  [facility]16 Echo >> severe LVH, EF 60 to 65%, grade 1 diastolic dysfx  CULTURES: Blood  12/21 >>> coag neg Staph Sputum 12/21 >>> Haemophilus influenzae C diff 12/21 >>> negative  ANTIBIOTICS: 12/15 Cefazolin >> 12/17 Vanc 12/21>>>12/24 Zosyn 12/21>>> 12/27  SIGNIFICANT EVENTS: 12/15 Admit, SAH >> coiling distal ACA aneurysm, IVC drain placed 12/30 Fever, not moving as much  LINES/TUBES: 12/15 ETT >> 12/29 12/15 Rt IJ CVL >>12/26 12/15 IVC >>  12/18 R PICC >> 12/23 12/23 R IJ TLC>>> 12/29 Trach Ninetta Lights(JY) >>  DISCUSSION:   50 yo female smoker presented with altered mental status, Rt pupil dilation from HTN emergency (BP 245/194) and SAH.  ASSESSMENT / PLAN:  NEUROLOGIC A:   [facility] >> mental status worse 12/30. Possible seizure prior to admission. P:   IVC drain, AEDs per neurolosurgery Wean off decadron per neurosurgery Continue nimodipine, zocor NS at 150 ml/hr Goal MAP > 80  PULMONARY A: Compromise airway 2nd to acute encephalopathy. Tobacco abuse. P:   Trach collar as tolerated F/u CXR intermittently  CARDIOVASCULAR A:  HTN emergency. Elevated troponin - demand ischemia. P:  Defer cardiology assessment for now  RENAL A:   Lactic acidosis >> resolved. Hypokalemia >> improved. Urine retention. P:   Monitor renal fx, urine outpt Keep foley in for now >> plan to d/c on 1/02 Added bethanechol  12/30  GASTROINTESTINAL A:   Nutrition. P:   Tube feeds while on vent Protonix for SUP  HEMATOLOGIC A:   Mild anemia, thrombocytopenia of critical illness >> resolved. P:  F/u CBC intermittently SCDs for DVT prevention  INFECTIOUS A:   Post-IVC placement prophylaxis >> completed 12/17. Tracheobronchitis with H influenzae in sputum from 12/21 >> completed Abx 12/27. Coag neg Staph in blood cx from 12/21 >> contaminate. Fever 12/30. P:   If fever recurs, then repeat cx's and consider restarting Abx  ENDOCRINE A:   Steroid induced hyperglycemia. P:   SSI    Coralyn Helling, MD [facility]  Pulmonary/Critical Care 01/30/2015, 8:26 AM Pager:  [phone] After 3pm call: [phone]

## 2015-01-31 ENCOUNTER — Inpatient Hospital Stay (HOSPITAL_COMMUNITY): Payer: Medicaid Other

## 2015-01-31 DIAGNOSIS — Z452 Encounter for adjustment and management of vascular access device: Secondary | ICD-10-CM | POA: Insufficient documentation

## 2015-01-31 DIAGNOSIS — Z4659 Encounter for fitting and adjustment of other gastrointestinal appliance and device: Secondary | ICD-10-CM

## 2015-01-31 DIAGNOSIS — T829XXA Unspecified complication of cardiac and vascular prosthetic device, implant and graft, initial encounter: Secondary | ICD-10-CM | POA: Insufficient documentation

## 2015-01-31 DIAGNOSIS — R29818 Other symptoms and signs involving the nervous system: Secondary | ICD-10-CM | POA: Insufficient documentation

## 2015-01-31 DIAGNOSIS — Z87898 Personal history of other specified conditions: Secondary | ICD-10-CM

## 2015-01-31 DIAGNOSIS — I609 Nontraumatic subarachnoid hemorrhage, unspecified: Secondary | ICD-10-CM

## 2015-01-31 LAB — URINALYSIS, ROUTINE W REFLEX MICROSCOPIC
Bilirubin Urine: NEGATIVE
GLUCOSE, UA: NEGATIVE mg/dL
Ketones, ur: NEGATIVE mg/dL
Nitrite: POSITIVE — AB
PROTEIN: NEGATIVE mg/dL
Specific Gravity, Urine: 1.02 (ref 1.005–1.030)
pH: 5 (ref 5.0–8.0)

## 2015-01-31 LAB — URINE MICROSCOPIC-ADD ON

## 2015-01-31 LAB — GLUCOSE, CAPILLARY
Glucose-Capillary: 135 mg/dL — ABNORMAL HIGH (ref 65–99)
Glucose-Capillary: 115 mg/dL — ABNORMAL HIGH (ref 65–99)
Glucose-Capillary: 131 mg/dL — ABNORMAL HIGH (ref 65–99)
Glucose-Capillary: 119 mg/dL — ABNORMAL HIGH (ref 65–99)
Glucose-Capillary: 127 mg/dL — ABNORMAL HIGH (ref 65–99)

## 2015-01-31 LAB — BASIC METABOLIC PANEL
Anion gap: 8 (ref 5–15)
BUN: 21 mg/dL — AB (ref 6–20)
CHLORIDE: 102 mmol/L (ref 101–111)
CO2: 28 mmol/L (ref 22–32)
CREATININE: 0.54 mg/dL (ref 0.44–1.00)
Calcium: 9.4 mg/dL (ref 8.9–10.3)
GFR calc Af Amer: >60 mL/min (ref >60)
Glucose, Bld: 179 mg/dL — ABNORMAL HIGH (ref 65–99)
Potassium: 3.8 mmol/L (ref 3.5–5.1)
SODIUM: 138 mmol/L (ref 135–145)

## 2015-01-31 LAB — CBC
HCT: 33.1 % — ABNORMAL LOW (ref 36.0–46.0)
Hemoglobin: 10.8 g/dL — ABNORMAL LOW (ref 12.0–15.0)
MCH: 30.9 pg (ref 26.0–34.0)
MCHC: 32.6 g/dL (ref 30.0–36.0)
MCV: 94.6 fL (ref 78.0–100.0)
PLATELETS: 299 10*3/uL (ref 150–400)
RBC: 3.5 MIL/uL — ABNORMAL LOW (ref 3.87–5.11)
RDW: 15 % (ref 11.5–15.5)
WBC: 16.3 10*3/uL — AB (ref 4.0–10.5)

## 2015-01-31 LAB — SODIUM, URINE, RANDOM: Sodium, Ur: 162 mmol/L

## 2015-01-31 LAB — OSMOLALITY, URINE: OSMOLALITY UR: 612 mosm/kg (ref 300–900)

## 2015-01-31 MED ORDER — DEXAMETHASONE SODIUM PHOSPHATE 4 MG/ML IJ SOLN
2.0000 mg | Freq: Two times a day (BID) | INTRAMUSCULAR | Status: DC
  Administered 2015-01-31 – 2015-02-02 (×5): 2 mg via INTRAVENOUS
  Filled 2015-01-31 (×5): qty 1

## 2015-01-31 MED ORDER — LOPERAMIDE HCL 1 MG/5ML PO LIQD
4.0000 mg | Freq: Every day | ORAL | Status: DC
  Administered 2015-02-01 – 2015-02-05 (×5): 4 mg
  Filled 2015-01-31 (×5): qty 20

## 2015-01-31 NOTE — Progress Notes (Signed)
Transcranial Doppler  Date POD PCO2 HCT BP  MCA ACA PCA OPHT SIPH VERT Basilar  01/15/15 JE     Right  Left   72  105   -47  -61   29  43   16  17   -54     -32     -32 ?      01-17-15 hc     Right  Left   65  53   -67  -61   30  33   38  25     24     -47     -44    01/19/15 MS     Right  Left   72  81   -45  -77   32  40   24  31   33  *   *  *   *      01/21/15 MS      Right  Left   56  108   -48  -37   61  -21   19  19    *  *   -17  -38   *      01/24/15 MS      Right  Left   112  116   -29  *   29  36   16  22   *  *   *  *   *      01/26/15 JE     Right  Left   96  96   -45  -171   40  49   18  21   29  29               01/28/15 MS     Right  Left   58  70   67  *   51  20   23  28    *  *   *  *   *      01-31-15 hc     Right  Left   65  75   46  71   *  26   *  *   *  *   *  *   *       MCA = Middle Cerebral Artery      OPHT = Opthalmic Artery     BASILAR = Basilar Artery   ACA = Anterior Cerebral Artery     SIPH = Carotid Siphon PCA = Posterior Cerebral Artery   VERT = Verterbral Artery                   Normal MCA = 62+\-12 ACA = 50+\-12 PCA = 42+\-23   01/15/15 Unable to insonate left siphon (pt was becoming agitated at this point). Unable to attempt left vertebral due to left neck line. Unsure if basilar mean velocity is accurate due to poor window/ positioning. JE  01/19/15- * Unable to insonate due to poor patient cooperation and poor acoustic window.  01/21/15- * Unable to insonate due to poor patient cooperation.  01/24/15- * Unable to insonate due to poor patient cooperation, bandaging, and patient position.  01/26/15- Unable to obtain bilateral vertebrals and basilar due to poor patient cooperation, bandaging, and patient position.  01/28/15-*Unable to insonate due to poor patient cooperation, bandaging, and patient position.    01-31-15-*Unable to insonate due to poort patient cooperation, bandanging and position   [place]  Dylana Shaw, RVT 01/31/2015 10:53 AM

## 2015-01-31 NOTE — Progress Notes (Signed)
PULMONARY / CRITICAL CARE MEDICINE   Name: Rachel Vang MRN: [id number] DOB: 1965-10-18    ADMISSION DATE:  01/13/2015  REFERRING MD:  EDP  CHIEF COMPLAINT:  AMS  SUBJECTIVE:  No change in neurostatus Urine output high Trach collar  VITAL SIGNS: BP 157/104 mmHg  Pulse 118  Temp(Src) 100 F (37.8 C) (Core (Comment))  Resp 25  Ht 5\' 5"  (1.651 m)  Wt 69.1 kg (152 lb 5.4 oz)  BMI 25.35 kg/m2  SpO2 100%  LMP  (LMP Unknown)  VENTILATOR SETTINGS: Vent Mode:  [-]  FiO2 (%):  [28 %] 28 %  INTAKE / OUTPUT: I/O last 3 completed shifts: In: 7380 [I.V.:5400; NG/GT:1980] Out: 7829 [FAOZH:0865; Drains:178; Stool:400]  PHYSICAL EXAMINATION: General: ill appearing Neuro: opens eyes spontaneously, withdraws to pain, not following commands - no changes HEENT: trach site clean Cardiovascular: s1 s2 regular, no murmur Lungs: scattered rhonchi no changes Abdomen: soft, non tender Musculoskeletal: no edema Skin: no rashes  LABS:  BMET  Recent Labs Lab 01/29/15 0620 01/30/15 0450 01/31/15 0405  NA 134* 137 138  K 4.1 3.8 3.8  CL 100* 100* 102  CO2 29 27 28   BUN 18 20 21*  CREATININE 0.51 0.59 0.54  GLUCOSE 183* 196* 179*   Electrolytes  Recent Labs Lab 01/29/15 0620 01/30/15 0450 01/31/15 0405  CALCIUM 9.1 9.4 9.4     CBC  Recent Labs Lab 01/29/15 0620 01/30/15 0450 01/31/15 0405  WBC 21.6* 20.7* 16.3*  HGB 11.1* 10.1* 10.8*  HCT 34.2* 30.8* 33.1*  PLT 261 230 299   ABG No results for input(s): PHART, PCO2ART, PO2ART in the last 168 hours. Glucose  Recent Labs Lab 01/30/15 1606 01/30/15 1927 01/30/15 2317 01/31/15 0314 01/31/15 0757 01/31/15 1220  GLUCAP 168* 133* 116* 135* 115* 131*   Imaging No results found.  STUDIES:  12/15 CT head >> [facility] 12/16 Echo >> severe LVH, EF 60 to 65%, grade 1 diastolic dysfx  CULTURES: Blood 12/21 >>> coag neg Staph Sputum 12/21 >>> Haemophilus influenzae C diff 12/21 >>>  negative  ANTIBIOTICS: 12/15 Cefazolin >> 12/17 Vanc 12/21>>>12/24 Zosyn 12/21>>> 12/27  SIGNIFICANT EVENTS: 12/15 Admit, SAH >> coiling distal ACA aneurysm, IVC drain placed 12/30 Fever, not moving as much 1/2- elevated urine output  LINES/TUBES: 12/15 ETT >> 12/29 12/15 Rt IJ CVL >>12/26 12/15 IVC >>  12/18 R PICC >> 12/23 12/23 R IJ TLC>>> 12/29 Trach Ninetta Lights) >>  DISCUSSION:   50 yo female smoker presented with altered mental status, Rt pupil dilation from HTN emergency (BP 245/194) and SAH.  ASSESSMENT / PLAN:  NEUROLOGIC A:   Acute encephalopathy 2nd to [facility], s/p coiling >> mental status worse 12/30. Possible seizure prior to admission. TCD 1/2 slight elevation MCA , ACA, low clinical suspicion vasospasm and out of typical window spasm P:   IVC drain, AEDs per neurolosurgery Wean off decadron no role Continue nimodipine x 21 days, zocor NS at 150 ml/hr - reduce rate Goal MAP > 70  PULMONARY A: Compromise airway 2nd to acute encephalopathy. Tobacco abuse. P:   Trach collar as tolerated Follow secretions  CARDIOVASCULAR A:  HTN emergency. Elevated troponin - demand ischemia. P:  MAP goal reduction  RENAL A:   Urine retention. High output, likely iatrogenic, at risk SIADH, CSW  P:   Monitor renal fx, urine outpt Keep foley in for now >> plan to d/c on 1/02 bethanechol 12/30 Re assess UA, urine osm, na Reduce saline and allow MAp to drop  GASTROINTESTINAL A:   Nutrition. P:   Tube feeds while on vent Protonix for SUP  HEMATOLOGIC A:   Mild anemia, thrombocytopenia of critical illness >> resolved. At risk hemoconcentration  P:  F/u CBC intermittently SCDs for DVT prevention  INFECTIOUS A:   Post-IVC placement prophylaxis >> completed 12/17. Tracheobronchitis with H influenzae in sputum from 12/21 >> completed Abx 12/27. Coag neg Staph in blood cx from 12/21 >> contaminate. Fever 12/30. P:   If fever recurs, then repeat cx's and consider  restarting Abx for drain with such high risk infection 5 days post placement Send UA  ENDOCRINE A:   Steroid induced hyperglycemia. P:   SSI  Ccm time 30 min   Mcarthur Rossettianiel J. Tyson AliasFeinstein, MD, FACP Pgr: [phone] [facility]

## 2015-01-31 NOTE — Progress Notes (Signed)
Patient ID: Rachel MinksStephanie N Vang, female   DOB: 05/02/1965, 50 y.o.   MRN: [id number] Patient appears alert and face has some animation however does not follow commands Question if patient is a phasic Hemiparetic Continues IVC drainage

## 2015-02-01 ENCOUNTER — Inpatient Hospital Stay (HOSPITAL_COMMUNITY): Payer: Medicaid Other

## 2015-02-01 DIAGNOSIS — G936 Cerebral edema: Secondary | ICD-10-CM | POA: Insufficient documentation

## 2015-02-01 DIAGNOSIS — J9621 Acute and chronic respiratory failure with hypoxia: Secondary | ICD-10-CM | POA: Insufficient documentation

## 2015-02-01 DIAGNOSIS — J9600 Acute respiratory failure, unspecified whether with hypoxia or hypercapnia: Secondary | ICD-10-CM

## 2015-02-01 LAB — CBC WITH DIFFERENTIAL/PLATELET
BASOS PCT: 0 %
Basophils Absolute: 0 10*3/uL (ref 0.0–0.1)
Eosinophils Absolute: 0.1 10*3/uL (ref 0.0–0.7)
Eosinophils Relative: 1 %
HEMATOCRIT: 35.2 % — AB (ref 36.0–46.0)
HEMOGLOBIN: 11.7 g/dL — AB (ref 12.0–15.0)
Lymphocytes Relative: 19 %
Lymphs Abs: 3 10*3/uL (ref 0.7–4.0)
MCH: 31.1 pg (ref 26.0–34.0)
MCHC: 33.2 g/dL (ref 30.0–36.0)
MCV: 93.6 fL (ref 78.0–100.0)
MONOS PCT: 6 %
Monocytes Absolute: 0.9 10*3/uL (ref 0.1–1.0)
NEUTROS ABS: 11.6 10*3/uL — AB (ref 1.7–7.7)
NEUTROS PCT: 74 %
Platelets: 326 10*3/uL (ref 150–400)
RBC: 3.76 MIL/uL — AB (ref 3.87–5.11)
RDW: 14.9 % (ref 11.5–15.5)
WBC: 15.6 10*3/uL — AB (ref 4.0–10.5)

## 2015-02-01 LAB — BASIC METABOLIC PANEL
ANION GAP: 7 (ref 5–15)
BUN: 20 mg/dL (ref 6–20)
CALCIUM: 9.4 mg/dL (ref 8.9–10.3)
CHLORIDE: 101 mmol/L (ref 101–111)
CO2: 28 mmol/L (ref 22–32)
Creatinine, Ser: 0.61 mg/dL (ref 0.44–1.00)
GFR calc non Af Amer: >60 mL/min (ref >60)
Glucose, Bld: 135 mg/dL — ABNORMAL HIGH (ref 65–99)
Potassium: 3.9 mmol/L (ref 3.5–5.1)
Sodium: 136 mmol/L (ref 135–145)

## 2015-02-01 LAB — GLUCOSE, CAPILLARY
GLUCOSE-CAPILLARY: 133 mg/dL — AB (ref 65–99)
GLUCOSE-CAPILLARY: 101 mg/dL — AB (ref 65–99)
GLUCOSE-CAPILLARY: 214 mg/dL — AB (ref 65–99)
Glucose-Capillary: 112 mg/dL — ABNORMAL HIGH (ref 65–99)
Glucose-Capillary: 123 mg/dL — ABNORMAL HIGH (ref 65–99)
Glucose-Capillary: 124 mg/dL — ABNORMAL HIGH (ref 65–99)

## 2015-02-01 MED ORDER — VANCOMYCIN HCL 10 G IV SOLR
1500.0000 mg | Freq: Once | INTRAVENOUS | Status: AC
  Administered 2015-02-01: 1500 mg via INTRAVENOUS
  Filled 2015-02-01: qty 1500

## 2015-02-01 MED ORDER — DEXTROSE 5 % IV SOLN
2.0000 g | Freq: Three times a day (TID) | INTRAVENOUS | Status: DC
  Administered 2015-02-01 – 2015-02-03 (×5): 2 g via INTRAVENOUS
  Filled 2015-02-01 (×8): qty 2

## 2015-02-01 MED ORDER — VANCOMYCIN HCL IN DEXTROSE 750-5 MG/150ML-% IV SOLN
750.0000 mg | Freq: Two times a day (BID) | INTRAVENOUS | Status: DC
  Administered 2015-02-02 – 2015-02-03 (×2): 750 mg via INTRAVENOUS
  Filled 2015-02-01 (×4): qty 150

## 2015-02-01 NOTE — Progress Notes (Signed)
35F foley inserted. Tedra CoupeLauren Kiser, RN assisted.

## 2015-02-01 NOTE — Progress Notes (Signed)
Pt seen and examined. No issues overnight.  EXAM: Temp:  [99.9 F (37.7 C)-102 F (38.9 C)] 102 F (38.9 C) (01/03 1112) Pulse Rate:  [86-128] 128 (01/03 1112) Resp:  [17-31] 28 (01/03 1112) BP: (114-179)/(76-123) 145/83 mmHg (01/03 1112) SpO2:  [96 %-100 %] 98 % (01/03 1112) FiO2 (%):  [28 %] 28 % (01/03 1112) Weight:  [67.3 kg (148 lb 5.9 oz)] 67.3 kg (148 lb 5.9 oz) (01/03 0400) Intake/Output      01/02 0701 - 01/03 0700 01/03 0701 - 01/04 0700   I.V. (mL/kg) 1876.7 (27.9) 60 (0.9)   NG/GT 1215    Total Intake(mL/kg) 3091.7 (45.9) 60 (0.9)   Urine (mL/kg/hr) 3195 (2) 490 (1.1)   Drains 117 (0.1) 6 (0)   Stool 200 (0.1)    Total Output 3512 496   Net -420.3 -436         Awake, alert Does not track, seems to have left gaze preference Move LUE/LLE spontaneously Minimal RUE/RLE movement to pain EVD in place, draininge well. Clear CSF  LABS: Lab Results  Component Value Date   CREATININE 0.61 02/01/2015   BUN 20 02/01/2015   NA 136 02/01/2015   K 3.9 02/01/2015   CL 101 02/01/2015   CO2 28 02/01/2015   CBC    Component Value Date/Time   WBC 15.6* 02/01/2015 0415   RBC 3.76* 02/01/2015 0415   HGB 11.7* 02/01/2015 0415   HCT 35.2* 02/01/2015 0415   PLT 326 02/01/2015 0415   MCV 93.6 02/01/2015 0415   MCH 31.1 02/01/2015 0415   MCHC 33.2 02/01/2015 0415   RDW 14.9 02/01/2015 0415   LYMPHSABS 3.0 02/01/2015 0415   MONOABS 0.9 02/01/2015 0415   EOSABS 0.1 02/01/2015 0415   BASOSABS 0.0 02/01/2015 0415    IMPRESSION: - 50 y.o. female SAH d# 21 s/p ACA coiling. Remains neurologically stable, awake but unresponsive - Febrile, may have UTI. Does not have evidence of ventriculitis as CSF appears clear and no change in neurologic exam.  PLAN: - Will need PEG - Raise EVD to 15mmHg today. Plan on CT head tomorrow prior to clamping.

## 2015-02-01 NOTE — Progress Notes (Signed)
ANTIBIOTIC CONSULT NOTE - INITIAL  Pharmacy Consult for vanc/ceftaz Indication: Possible UTI vs. IVC infxn, low-grade fever  Not on File  Patient Measurements: Height: 5\' 5"  (165.1 cm) Weight: 148 lb 5.9 oz (67.3 kg) IBW/kg (Calculated) : 57  Vital Signs: Temp: 102 F (38.9 C) (01/03 1112) Temp Source: Core (Comment) (01/03 0400) BP: 145/83 mmHg (01/03 1112) Pulse Rate: 128 (01/03 1112) Intake/Output from previous day: 01/02 0701 - 01/03 0700 In: 3091.7 [I.V.:1876.7; NG/GT:1215] Out: 3512 [Urine:3195; Drains:117; Stool:200] Intake/Output from this shift: Total I/O In: 50 [I.V.:50] Out: 496 [Urine:490; Drains:6]  Labs:  Recent Labs  01/30/15 0450 01/31/15 0405 02/01/15 0415  WBC 20.7* 16.3* 15.6*  HGB 10.1* 10.8* 11.7*  PLT 230 299 326  CREATININE 0.59 0.54 0.61   Estimated Creatinine Clearance: 74.9 mL/min (by C-G formula based on Cr of 0.61). No results for input(s): VANCOTROUGH, VANCOPEAK, VANCORANDOM, GENTTROUGH, GENTPEAK, GENTRANDOM, TOBRATROUGH, TOBRAPEAK, TOBRARND, AMIKACINPEAK, AMIKACINTROU, AMIKACIN in the last 72 hours.   Microbiology: Recent Results (from the past 720 hour(s))  MRSA PCR Screening     Status: None   Collection Time: 01/13/15  4:01 PM  Result Value Ref Range Status   MRSA by PCR NEGATIVE NEGATIVE Final    Comment:        The GeneXpert MRSA Assay (FDA approved for NASAL specimens only), is one component of a comprehensive MRSA colonization surveillance program. It is not intended to diagnose MRSA infection nor to guide or monitor treatment for MRSA infections.   Culture, blood (routine x 2)     Status: None   Collection Time: 01/19/15  9:40 AM  Result Value Ref Range Status   Specimen Description BLOOD LEFT HAND  Final   Special Requests BOTTLES DRAWN AEROBIC ONLY 3CCS  Final   Culture  Setup Time   Final    GRAM POSITIVE COCCI IN CLUSTERS AEROBIC BOTTLE ONLY CRITICAL RESULT CALLED TO, READ BACK BY AND VERIFIED WITH: Patty Sermons RN 10:20 01/20/15 ([email or web address])    Culture   Final    STAPHYLOCOCCUS SPECIES (COAGULASE NEGATIVE) THE SIGNIFICANCE OF ISOLATING THIS ORGANISM FROM A SINGLE SET OF BLOOD CULTURES WHEN MULTIPLE SETS ARE DRAWN IS UNCERTAIN. PLEASE NOTIFY THE MICROBIOLOGY DEPARTMENT WITHIN ONE WEEK IF SPECIATION AND SENSITIVITIES ARE REQUIRED.    Report Status 01/23/2015 FINAL  Final  Culture, blood (routine x 2)     Status: None   Collection Time: 01/19/15  9:50 AM  Result Value Ref Range Status   Specimen Description BLOOD LEFT HAND  Final   Special Requests IN PEDIATRIC BOTTLE 2CCS  Final   Culture NO GROWTH 5 DAYS  Final   Report Status 01/24/2015 FINAL  Final  Culture, respiratory (NON-Expectorated)     Status: None   Collection Time: 01/19/15  9:50 AM  Result Value Ref Range Status   Specimen Description ENDOTRACHEAL  Final   Special Requests NONE  Final   Gram Stain   Final    MODERATE WBC PRESENT, PREDOMINANTLY PMN NO SQUAMOUS EPITHELIAL CELLS SEEN ABUNDANT GRAM NEGATIVE COCCOBACILLI Performed at [facility]    Culture   Final    ABUNDANT HAEMOPHILUS INFLUENZAE Note: BETA LACTAMASE NEGATIVE Performed at [facility]    Report Status 01/21/2015 FINAL  Final  C difficile quick scan w PCR reflex     Status: None   Collection Time: 01/19/15  2:40 PM  Result Value Ref Range Status   C Diff antigen NEGATIVE NEGATIVE Final   C Diff toxin NEGATIVE NEGATIVE  Final   C Diff interpretation Negative for toxigenic C. difficile  Final    Medical History: Past Medical History  Diagnosis Date  . Hypertension     Assessment: [age] yof with low-grade fever, concern for UTI vs. IVC infxn. To restart on abx, previously on abx for HCAP. Pharmacy consulted to dose vanc/ceftaz. Tmax/24h 102, WBC down to 15.6. CrCl~75. To re-culture.  Ancef 12/15 > 12/17 Vanc 12/21 >> 12/24; 1/3>> Zosyn 12/21 >>12/27 Ceftaz 1/3 >>  MRSA PCR neg  12/21 resp cx: h.flu 12/21 blood cx: 1/2 CoNS 12/21  cdiff: neg 1/3 BCx2>> 1/3 resp cx>> 1/3 UC>>  Goal of Therapy:  Vancomycin trough level 15-20 mcg/ml  Plan:  -Vanc 1500mg  IV x1; then Vanc 750mg  IV q12h -Ceftaz 2g IV q8h -Monitor clinical progress, c/s, renal function, abx plan/LOT -VT@SS  as indicated  Babs BertinHaley Dorsel Flinn, PharmD, [facility] HospitalBCPS Clinical Pharmacist Pager [phone] 02/01/2015 12:59 PM

## 2015-02-01 NOTE — Progress Notes (Addendum)
PULMONARY / CRITICAL CARE MEDICINE   Name: Rachel MinksStephanie N Shaver MRN: [id number] DOB: 09/10/1965    ADMISSION DATE:  01/13/2015  REFERRING MD:  EDP  CHIEF COMPLAINT:  AMS  SUBJECTIVE:  No change in neurostatus Urine output decreased  fever  VITAL SIGNS: BP 149/96 mmHg  Pulse 103  Temp(Src) 100.8 F (38.2 C) (Core (Comment))  Resp 27  Ht 5\' 5"  (1.651 m)  Wt 148 lb 5.9 oz (67.3 kg)  BMI 24.69 kg/m2  SpO2 98%  LMP  (LMP Unknown)  VENTILATOR SETTINGS: Vent Mode:  [-]  FiO2 (%):  [28 %] 28 %  INTAKE / OUTPUT: I/O last 3 completed shifts: In: 5551.7 [I.V.:3676.7; NG/GT:1875] Out: 5588 [Urine:5195; Drains:193; Stool:200]  PHYSICAL EXAMINATION: General: ill appearing Neuro: opens eyes spontaneously, withdraws to pain, not following commands - no changes HEENT: trach site clean Cardiovascular: s1 s2 regular, no murmur Lungs: scattered rhonchi no changes Abdomen: soft, non tender Musculoskeletal: no edema Skin: no rashes.  LABS:  BMET  Recent Labs Lab 01/30/15 0450 01/31/15 0405 02/01/15 0415  NA 137 138 136  K 3.8 3.8 3.9  CL 100* 102 101  CO2 27 28 28   BUN 20 21* 20  CREATININE 0.59 0.54 0.61  GLUCOSE 196* 179* 135*   Electrolytes  Recent Labs Lab 01/30/15 0450 01/31/15 0405 02/01/15 0415  CALCIUM 9.4 9.4 9.4     CBC  Recent Labs Lab 01/30/15 0450 01/31/15 0405 02/01/15 0415  WBC 20.7* 16.3* 15.6*  HGB 10.1* 10.8* 11.7*  HCT 30.8* 33.1* 35.2*  PLT 230 299 326   ABG No results for input(s): PHART, PCO2ART, PO2ART in the last 168 hours. Glucose  Recent Labs Lab 01/31/15 1220 01/31/15 1654 01/31/15 2015 01/31/15 2355 02/01/15 0342 02/01/15 0755  GLUCAP 131* 119* 127* 112* 133* 101*   Imaging No results found.  STUDIES:  [facility] Echo >> severe LVH, EF 60 to 65%, grade 1 diastolic dysfx  CULTURES: Blood 12/21 >>> coag neg Staph Sputum 12/21 >>> Haemophilus influenzae C diff 12/21 >>>  negative  ANTIBIOTICS: 12/15 Cefazolin >> 12/17 Vanc 12/21>>>12/24 Zosyn 12/21>>> 12/27  SIGNIFICANT EVENTS: 12/15 Admit, SAH >> coiling distal ACA aneurysm, IVC drain placed 12/30 Fever, not moving as much 1/2- elevated urine output  LINES/TUBES: 12/15 ETT >> 12/29 12/15 Rt IJ CVL >>12/26 12/15 IVC >>  12/18 R PICC >> 12/23 12/23 R IJ TLC>>>plan 1/3 12/29 Trach Ninetta Lights(JY) >>  DISCUSSION:   50 yo female smoker presented with altered mental status, Rt pupil dilation from HTN emergency (BP 245/194) and SAH.  ASSESSMENT / PLAN:  NEUROLOGIC A:   [facility] >> mental status worse 12/30. Possible seizure prior to admission. TCD 1/2 slight elevation MCA , ACA, low clinical suspicion vasospasm and out of typical window spasm P:   IVC drain, AEDs per neurolosurgery Wean off decadron no role Continue nimodipine x 21 days likely we can dc zocor in 1 week also NS Goal MAP > 70  PULMONARY A: Compromise airway 2nd to acute encephalopathy. Tobacco abuse. P:   Trach collar as tolerated Follow secretions pcxr neg infiltrate  CARDIOVASCULAR A:  HTN emergency. Elevated troponin - demand ischemia. P:  MAP goal 70, out of HHH  RENAL Lab Results  Component Value Date   CREATININE 0.61 02/01/2015   CREATININE 0.54 01/31/2015   CREATININE 0.59 01/30/2015    A:   Urine retention. High output, likely iatrogenic, at risk SIADH, CSW  P:  Monitor renal fx, urine outpt Keep foley in for now >> plan to d/c on 1/04 bethanechol 12/30 Re assess UA, urine osm, na 1/2 contradictory Reduce saline and allow MAP to drop  GASTROINTESTINAL A:   Nutrition. P:   Tube feeds while on vent Protonix for SUP  HEMATOLOGIC  Recent Labs  01/31/15 0405 02/01/15 0415  HGB 10.8* 11.7*    A:   Mild anemia, thrombocytopenia of critical illness >> resolved. At risk hemoconcentration  P:  F/u CBC intermittently SCDs for DVT prevention  INFECTIOUS A:    Post-IVC placement prophylaxis >> completed 12/17. Tracheobronchitis with H influenzae in sputum from 12/21 >> completed Abx 12/27. Coag neg Staph in blood cx from 12/21 >> contaminate. Fever 12/30. 1/3 low grade temp 100.8 - concern urine P:   1/3 pan culture  ENDOCRINE A:   Steroid induced hyperglycemia. P:   SSI Steve Minor ACNP Adolph Pollack PCCM Pager [phone] till 3 pm If no answer page [phone] 02/01/2015, 10:46 AM   STAFF NOTE: I, Rory Percy, MD FACP have personally reviewed patient's available data, including medical history, events of note, physical examination and test results as part of my evaluation. I have discussed with resident/NP and other care providers such as pharmacist, RN and RRT. In addition, I personally evaluated patient and elicited key findings of: remains on trach collar, off vent, NO HCAP on pcxr, urine maybe source not clear to me, culture urine, drain head more of concern, will add empiric vanc, ceftaz, NS addressing safety to dc, PEG when fevers better, will call trauma, urine na likley relates to iatrogenic , csw possible, not clinically noted however, for CT head in am per NS, dc line neck, foley unfortunately is required with urine output and in bed etc, change foley needed, if we go without foley she will inevitably develop horrible ulcerations skin The patient is critically ill with multiple organ systems failure and requires high complexity decision making for assessment and support, frequent evaluation and titration of therapies, application of advanced monitoring technologies and extensive interpretation of multiple databases.   Critical Care Time devoted to patient care services described in this note is 30 Minutes. This time reflects time of care of this signee: Rory Percy, MD FACP. This critical care time does not reflect procedure time, or teaching time or supervisory time of PA/NP/Med student/Med Resident etc but could involve care  discussion time. Rest per NP/medical resident whose note is outlined above and that I agree with   Mcarthur Rossetti. Tyson Alias, MD, FACP Pgr: [phone] [facility] 02/01/2015 12:33 PM

## 2015-02-01 NOTE — Consult Note (Signed)
Reason for Consult:PEG placement Referring Physician: [name] is an 50 y.o. female.  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 She has not made enough progress to safely swallow. We have been consulted to place a PEG for permanent feeding access.  Past Medical History  Diagnosis Date  . Hypertension     Past Surgical History  Procedure Laterality Date  . Abdominal surgery    . Radiology with anesthesia N/A 01/13/2015    Procedure: RADIOLOGY WITH ANESTHESIA;  Surgeon: Consuella Lose, MD;  Location: [facility];  Service: Radiology;  Laterality: N/A;   Abd scar is lower midline -- should not prove to be an issue during PEG.  History reviewed. No pertinent family history.  Social History:  has no tobacco, alcohol, and drug history on file.  Allergies: Not on File  Medications: I have reviewed the patient's current medications.  Results for orders placed or performed during the hospital encounter of 01/13/15 (from the past 48 hour(s))  Glucose, capillary     Status: Abnormal   Collection Time: 01/30/15  4:06 PM  Result Value Ref Range   Glucose-Capillary 168 (H) 65 - 99 mg/dL   Comment 1 Notify RN   Glucose, capillary     Status: Abnormal   Collection Time: 01/30/15  7:27 PM  Result Value Ref Range   Glucose-Capillary 133 (H) 65 - 99 mg/dL  Glucose, capillary     Status: Abnormal   Collection Time: 01/30/15 11:17 PM  Result Value Ref Range   Glucose-Capillary 116 (H) 65 - 99 mg/dL  Glucose, capillary     Status: Abnormal   Collection Time: 01/31/15  3:14 AM  Result Value Ref Range   Glucose-Capillary 135 (H) 65 - 99 mg/dL  Basic metabolic panel     Status: Abnormal   Collection Time: 01/31/15  4:05 AM  Result Value Ref Range   Sodium 138 135 - 145 mmol/L   Potassium 3.8 3.5 - 5.1 mmol/L   Chloride 102 101 - 111 mmol/L   CO2 28 22 - 32 mmol/L   Glucose, Bld 179 (H) 65 - 99 mg/dL   BUN 21 (H) 6 - 20 mg/dL   Creatinine,  Ser 0.54 0.44 - 1.00 mg/dL   Calcium 9.4 8.9 - 10.3 mg/dL   GFR calc non Af Amer >60 >60 mL/min   GFR calc Af Amer >60 >60 mL/min    Comment: (NOTE) The eGFR has been calculated using the CKD EPI equation. This calculation has not been validated in all clinical situations. eGFR's persistently <60 mL/min signify possible Chronic Kidney Disease.    Anion gap 8 5 - 15  CBC     Status: Abnormal   Collection Time: 01/31/15  4:05 AM  Result Value Ref Range   WBC 16.3 (H) 4.0 - 10.5 K/uL   RBC 3.50 (L) 3.87 - 5.11 MIL/uL   Hemoglobin 10.8 (L) 12.0 - 15.0 g/dL   HCT 33.1 (L) 36.0 - 46.0 %   MCV 94.6 78.0 - 100.0 fL   MCH 30.9 26.0 - 34.0 pg   MCHC 32.6 30.0 - 36.0 g/dL   RDW 15.0 11.5 - 15.5 %   Platelets 299 150 - 400 K/uL  Glucose, capillary     Status: Abnormal   Collection Time: 01/31/15  7:57 AM  Result Value Ref Range   Glucose-Capillary 115 (H) 65 - 99 mg/dL   Comment 1 Document in Chart   Glucose, capillary  Status: Abnormal   Collection Time: 01/31/15 12:20 PM  Result Value Ref Range   Glucose-Capillary 131 (H) 65 - 99 mg/dL   Comment 1 Document in Chart   Sodium, urine, random     Status: None   Collection Time: 01/31/15  2:00 PM  Result Value Ref Range   Sodium, Ur 162 mmol/L  Osmolality, urine     Status: None   Collection Time: 01/31/15  2:00 PM  Result Value Ref Range   Osmolality, Ur 612 300 - 900 mOsm/kg  Urinalysis, Routine w reflex microscopic (not at [facility])     Status: Abnormal   Collection Time: 01/31/15  2:00 PM  Result Value Ref Range   Color, Urine YELLOW YELLOW   APPearance CLOUDY (A) CLEAR   Specific Gravity, Urine 1.020 1.005 - 1.030   pH 5.0 5.0 - 8.0   Glucose, UA NEGATIVE NEGATIVE mg/dL   Hgb urine dipstick SMALL (A) NEGATIVE   Bilirubin Urine NEGATIVE NEGATIVE   Ketones, ur NEGATIVE NEGATIVE mg/dL   Protein, ur NEGATIVE NEGATIVE mg/dL   Nitrite POSITIVE (A) NEGATIVE   Leukocytes, UA SMALL (A) NEGATIVE  Urine microscopic-add on     Status:  Abnormal   Collection Time: 01/31/15  2:00 PM  Result Value Ref Range   Squamous Epithelial / LPF 0-5 (A) NONE SEEN   WBC, UA 6-30 0 - 5 WBC/hpf   RBC / HPF 0-5 0 - 5 RBC/hpf   Bacteria, UA MANY (A) NONE SEEN  Glucose, capillary     Status: Abnormal   Collection Time: 01/31/15  4:54 PM  Result Value Ref Range   Glucose-Capillary 119 (H) 65 - 99 mg/dL   Comment 1 Notify RN    Comment 2 Document in Chart   Glucose, capillary     Status: Abnormal   Collection Time: 01/31/15  8:15 PM  Result Value Ref Range   Glucose-Capillary 127 (H) 65 - 99 mg/dL   Comment 1 Notify RN    Comment 2 Document in Chart   Glucose, capillary     Status: Abnormal   Collection Time: 01/31/15 11:55 PM  Result Value Ref Range   Glucose-Capillary 112 (H) 65 - 99 mg/dL   Comment 1 Notify RN   Glucose, capillary     Status: Abnormal   Collection Time: 02/01/15  3:42 AM  Result Value Ref Range   Glucose-Capillary 133 (H) 65 - 99 mg/dL  Basic metabolic panel     Status: Abnormal   Collection Time: 02/01/15  4:15 AM  Result Value Ref Range   Sodium 136 135 - 145 mmol/L   Potassium 3.9 3.5 - 5.1 mmol/L   Chloride 101 101 - 111 mmol/L   CO2 28 22 - 32 mmol/L   Glucose, Bld 135 (H) 65 - 99 mg/dL   BUN 20 6 - 20 mg/dL   Creatinine, Ser 0.61 0.44 - 1.00 mg/dL   Calcium 9.4 8.9 - 10.3 mg/dL   GFR calc non Af Amer >60 >60 mL/min   GFR calc Af Amer >60 >60 mL/min    Comment: (NOTE) The eGFR has been calculated using the CKD EPI equation. This calculation has not been validated in all clinical situations. eGFR's persistently <60 mL/min signify possible Chronic Kidney Disease.    Anion gap 7 5 - 15  CBC with Differential/Platelet     Status: Abnormal   Collection Time: 02/01/15  4:15 AM  Result Value Ref Range   WBC 15.6 (H) 4.0 - 10.5 K/uL  RBC 3.76 (L) 3.87 - 5.11 MIL/uL   Hemoglobin 11.7 (L) 12.0 - 15.0 g/dL   HCT 35.2 (L) 36.0 - 46.0 %   MCV 93.6 78.0 - 100.0 fL   MCH 31.1 26.0 - 34.0 pg   MCHC 33.2  30.0 - 36.0 g/dL   RDW 14.9 11.5 - 15.5 %   Platelets 326 150 - 400 K/uL   Neutrophils Relative % 74 %   Neutro Abs 11.6 (H) 1.7 - 7.7 K/uL   Lymphocytes Relative 19 %   Lymphs Abs 3.0 0.7 - 4.0 K/uL   Monocytes Relative 6 %   Monocytes Absolute 0.9 0.1 - 1.0 K/uL   Eosinophils Relative 1 %   Eosinophils Absolute 0.1 0.0 - 0.7 K/uL   Basophils Relative 0 %   Basophils Absolute 0.0 0.0 - 0.1 K/uL  Glucose, capillary     Status: Abnormal   Collection Time: 02/01/15  7:55 AM  Result Value Ref Range   Glucose-Capillary 101 (H) 65 - 99 mg/dL  Glucose, capillary     Status: Abnormal   Collection Time: 02/01/15 11:40 AM  Result Value Ref Range   Glucose-Capillary 214 (H) 65 - 99 mg/dL    Dg Chest Port 1 View  02/01/2015  CLINICAL DATA:  Respiratory failure. EXAM: PORTABLE CHEST 1 VIEW COMPARISON:  January 28, 2015. FINDINGS: Stable cardiomediastinal silhouette. No pneumothorax or pleural effusion is noted. Tracheostomy tube is unchanged in position. Distal tip of feeding tube is seen in expected position of distal stomach. Stable position of right internal jugular catheter line with distal tip in expected position of upper right atrium. Both lungs are clear. The visualized skeletal structures are unremarkable. IMPRESSION: Stable support apparatus. No acute cardiopulmonary abnormality seen. Electronically Signed   By: Marijo Conception, M.D.   On: 02/01/2015 12:21    Review of Systems  Unable to perform ROS: mental acuity   Blood pressure 145/83, pulse 128, temperature 102 F (38.9 C), temperature source Core (Comment), resp. rate 28, height 5' 5"  (1.651 m), weight 67.3 kg (148 lb 5.9 oz), SpO2 98 %. Physical Exam  Constitutional: She appears well-developed. No distress.  HENT:  Head: Normocephalic and atraumatic.  Neck: Neck supple.  Cardiovascular: Regular rhythm.  Tachycardia present.  Exam reveals no gallop and no friction rub.   No murmur heard. Respiratory: Effort normal and breath  sounds normal. No respiratory distress. She has no wheezes. She has no rales.  GI: Soft. Bowel sounds are normal. She exhibits no distension. There is no tenderness.  Neurological: She is unresponsive.  Skin: Skin is warm and dry.    Assessment/Plan: Dysphagia -- I spoke with one of her son's who consented for the procedure. Will try to plan for the next couple of days.    Lisette Abu, PA-C Pager: [phone] General Trauma PA Pager: [phone] 02/01/2015, 1:16 PM

## 2015-02-01 NOTE — Progress Notes (Signed)
Per report, pt foley was leaking during day shift. MD wants foley in for pt skin protection. Foley catheter was replaced (13F) on day shift.Marland Kitchen. Upon assessment at shift change, pt bed wet, foley leaking. Spoke with MD about replacing foley with 62F. Will replace foley and continue to monitor.

## 2015-02-02 ENCOUNTER — Inpatient Hospital Stay (HOSPITAL_COMMUNITY): Payer: Medicaid Other

## 2015-02-02 ENCOUNTER — Encounter (HOSPITAL_COMMUNITY)
Admission: EM | Disposition: A | Discharge status: Skilled Nursing Facility | Payer: Self-pay | Source: Home / Self Care | Attending: Pulmonary Disease

## 2015-02-02 ENCOUNTER — Encounter (HOSPITAL_COMMUNITY): Payer: Self-pay | Admitting: *Deleted

## 2015-02-02 ENCOUNTER — Inpatient Hospital Stay (HOSPITAL_COMMUNITY): Payer: Medicaid Other | Admitting: Certified Registered"

## 2015-02-02 DIAGNOSIS — I609 Nontraumatic subarachnoid hemorrhage, unspecified: Secondary | ICD-10-CM

## 2015-02-02 DIAGNOSIS — J9610 Chronic respiratory failure, unspecified whether with hypoxia or hypercapnia: Secondary | ICD-10-CM

## 2015-02-02 DIAGNOSIS — Z930 Tracheostomy status: Secondary | ICD-10-CM

## 2015-02-02 HISTORY — PX: PEG PLACEMENT: SHX5437

## 2015-02-02 HISTORY — PX: ESOPHAGOGASTRODUODENOSCOPY (EGD) WITH PROPOFOL: SHX5813

## 2015-02-02 LAB — PHOSPHORUS: Phosphorus: 4.1 mg/dL (ref 2.5–4.6)

## 2015-02-02 LAB — GLUCOSE, CAPILLARY
GLUCOSE-CAPILLARY: 101 mg/dL — AB (ref 65–99)
Glucose-Capillary: 188 mg/dL — ABNORMAL HIGH (ref 65–99)
Glucose-Capillary: 91 mg/dL (ref 65–99)
Glucose-Capillary: 183 mg/dL — ABNORMAL HIGH (ref 65–99)
Glucose-Capillary: 116 mg/dL — ABNORMAL HIGH (ref 65–99)

## 2015-02-02 LAB — BASIC METABOLIC PANEL
Anion gap: 10 (ref 5–15)
BUN: 19 mg/dL (ref 6–20)
CALCIUM: 9.7 mg/dL (ref 8.9–10.3)
CO2: 27 mmol/L (ref 22–32)
CREATININE: 0.5 mg/dL (ref 0.44–1.00)
Chloride: 104 mmol/L (ref 101–111)
GFR calc Af Amer: >60 mL/min (ref >60)
GFR calc non Af Amer: >60 mL/min (ref >60)
GLUCOSE: 78 mg/dL (ref 65–99)
Potassium: 4.1 mmol/L (ref 3.5–5.1)
Sodium: 141 mmol/L (ref 135–145)

## 2015-02-02 LAB — CBC
HEMATOCRIT: 38.5 % (ref 36.0–46.0)
Hemoglobin: 12.5 g/dL (ref 12.0–15.0)
MCH: 30.8 pg (ref 26.0–34.0)
MCHC: 32.5 g/dL (ref 30.0–36.0)
MCV: 94.8 fL (ref 78.0–100.0)
Platelets: 338 10*3/uL (ref 150–400)
RBC: 4.06 MIL/uL (ref 3.87–5.11)
RDW: 15.1 % (ref 11.5–15.5)
WBC: 15.7 10*3/uL — ABNORMAL HIGH (ref 4.0–10.5)

## 2015-02-02 LAB — MAGNESIUM: Magnesium: 2 mg/dL (ref 1.7–2.4)

## 2015-02-02 SURGERY — ESOPHAGOGASTRODUODENOSCOPY (EGD) WITH PROPOFOL
Anesthesia: Monitor Anesthesia Care

## 2015-02-02 MED ORDER — PROPOFOL 10 MG/ML IV BOLUS
INTRAVENOUS | Status: DC | PRN
  Administered 2015-02-02 (×4): 20 mg via INTRAVENOUS

## 2015-02-02 NOTE — Progress Notes (Signed)
OT Cancellation Note  Patient Details Name: Rachel MinksStephanie N Vang MRN: [id number] DOB: 04/29/1965   Cancelled Treatment:    Reason Eval/Treat Not Completed: Patient at procedure or test/ unavailable - Also note, pt with strict bedrest orders written 01/13/15, please update activity orders if appropriate.  Thanks!  Tovah Slavick [place], OTR/L [phone]  Jeani HawkingConarpe, Spiro Ausborn M 02/02/2015, 1:30 PM

## 2015-02-02 NOTE — Progress Notes (Addendum)
Transcranial Doppler  Date POD PCO2 HCT BP  MCA ACA PCA OPHT SIPH VERT Basilar  01/15/15 JE     Right  Left   72  105   -47  -61   29  43   16  17   -54     -32     -32 ?      01-17-15 hc     Right  Left   65  53   -67  -61   30  33   38  25     24     -47     -44    01/19/15 MS     Right  Left   72  81   -45  -77   32  40   24  31   33  *   *  *   *      01/21/15 MS      Right  Left   56  108   -48  -37   61  -21   19  19    *  *   -17  -38   *      01/24/15 MS      Right  Left   112  116   -29  *   29  36   16  22   *  *   *  *   *      01/26/15 JE     Right  Left   96  96   -45  -171   40  49   18  21   29  29               01/28/15 MS     Right  Left   58  70   67  *   51  20   23  28    *  *   *  *   *      01-31-15 hc     Right  Left   65  75   46  71   *  26   *  *   *  *   *  *   *      02/02/15 MS     Right Left 34 * -22 * * * * * * * * * *   MCA = Middle Cerebral Artery      OPHT = Opthalmic Artery     BASILAR = Basilar Artery   ACA = Anterior Cerebral Artery     SIPH = Carotid Siphon PCA = Posterior Cerebral Artery   VERT = Verterbral Artery                   Normal MCA = 62+\-12 ACA = 50+\-12 PCA = 42+\-23   01/15/15 Unable to insonate left siphon (pt was becoming agitated at this point). Unable to attempt left vertebral due to left neck line. Unsure if basilar mean velocity is accurate due to poor window/ positioning. JE  01/19/15- * Unable to insonate due to poor patient cooperation and poor acoustic window.  01/21/15- * Unable to insonate due to poor patient cooperation.  01/24/15- * Unable to insonate due to poor patient cooperation, bandaging, and patient position.  01/26/15- Unable to obtain bilateral vertebrals and basilar due to poor patient cooperation, bandaging, and patient position.  01/28/15-*Unable to insonate due to poor  patient cooperation, bandaging, and patient position.   01-31-15-*Unable to insonate due to poort patient cooperation, bandanging and position  02/02/15-*Unable to insonate due to extremely poor patient cooperate, bandaging, and position.   02/02/2015 4:05 PM [name], RVT, RDCS, RDMS

## 2015-02-02 NOTE — Anesthesia Postprocedure Evaluation (Signed)
Anesthesia Post Note  Patient: Rachel MinksStephanie N Vang  Procedure(s) Performed: Procedure(s) (LRB): ESOPHAGOGASTRODUODENOSCOPY (EGD) WITH PROPOFOL (N/A) PERCUTANEOUS ENDOSCOPIC GASTROSTOMY (PEG) PLACEMENT (N/A)  Patient location during evaluation: PACU Anesthesia Type: General Level of consciousness: awake Pain management: pain level controlled Vital Signs Assessment: post-procedure vital signs reviewed and stable Respiratory status: spontaneous breathing, nonlabored ventilation, respiratory function stable and aerosol facemask Cardiovascular status: stable and blood pressure returned to baseline Anesthetic complications: no    Last Vitals:  Filed Vitals:   02/02/15 1210 02/02/15 1353  BP: 140/98 129/99  Pulse: 119   Temp:    Resp: 27 28    Last Pain:  Filed Vitals:   02/02/15 1402  PainSc: 0-No pain                 Reino KentJudd, Ezme Duch J

## 2015-02-02 NOTE — Op Note (Signed)
OPERATIVE REPORT  DATE OF OPERATION:  02/02/2015  PATIENT:  Rachel Vang  50 y.o. female  PRE-OPERATIVE DIAGNOSIS:  dysphagia  POST-OPERATIVE DIAGNOSIS:  peg placement  PROCEDURE:  Procedure(s): ESOPHAGOGASTRODUODENOSCOPY (EGD) WITH PROPOFOL PERCUTANEOUS ENDOSCOPIC GASTROSTOMY (PEG) PLACEMENT  SURGEON:  Surgeon(s): Jimmye NormanJames Zenas Santa, MD  ASSISTANLeotis Shames: Jeffery, PA-C  ANESTHESIA:   general and with tracheostomy  EBL: <10 ml  BLOOD ADMINISTERED: none  DRAINS: Gastrostomy Tube   SPECIMEN:  No Specimen  COUNTS CORRECT:  YES  PROCEDURE DETAILS: The patient was taken to the endoscopy suite and the procedure was performed in the bed that the patient was transported event. She was given sedation through her tracheostomy of inhalation gas and IV sedation then her abdomen was prepped and draped in usual sterile manner.  A proper timeout was performed identifying the patient and procedure to be performed. The Pentax [id number] endoscope was passed oral pharyngeally we followed the patient's previous feeding tube into the esophagus, the stomach. It was not in a postpyloric position. We subsequent a past the endoscope through the pylorus into the duodenum where there was no evidence of pathology.  We retrieved the endoscope into the body of the stomach where we could see the impression of the assistance finger on the anterior abdominal wall. Was at that site that incision was made and an angiocatheter passed into the stomach under direct vision. A looped blue wire was passed through the angiocatheter which was snared by the endoscope and port out through the patient's mouth. We note that around the pull-through gastrostomy tube which is subsequently passed through the oropharynx to its resting position on the anterior gastric wall.  Pictures of the tube and is confirmed position are included.  All counts were correct. The patient tolerated procedure well and will be transferred back to the intensive  care unit in stable condition.  PATIENT DISPOSITION:  With tracheostomy back to ICU in stable condition       Lydie Stammen 1/4/20171:47 PM

## 2015-02-02 NOTE — Progress Notes (Signed)
PULMONARY / CRITICAL CARE MEDICINE   Name: Rachel Vang MRN: [id number] DOB: 07/02/1965    ADMISSION DATE:  01/13/2015  REFERRING MD:  EDP  CHIEF COMPLAINT:  AMS   CULTURES: Blood 12/21 >>> coag neg Staph Sputum 12/21 >>> Haemophilus influenzae C diff 12/21 >>> negative   ANTIBIOTICS: 12/15 Cefazolin >> 12/17 Vanc 12/21>>>12/24 Zosyn 12/21>>> 12/27   LINES/TUBES: 12/15 ETT >> 12/29 12/15 Rt IJ CVL >>12/26 12/15 IVC >>  12/18 R PICC >> 12/23 12/23 R IJ TLC>>>plan 1/3 12/29 Trach (JY) >>   SIGNIFICANT EVENTS: 12/15 Admit, SAH >> coiling distal ACA aneurysm, IVC drain placed 12[facility] Echo >> severe LVH, EF 60 to 65%, grade 1 diastolic dysfx 12/29 Trach [place](JY) >> 12/30 Fever, not moving as much 1/2- elevated urine output 02/01/15 -   No change in neurostatus Urine output decreased  Fever Lines removed Foley changed Abx expanded   SUBJECTIVE/OVERNIGHT/INTERVAL HX 02/02/15 - fever improved significantly. For PEG 02/02/15. Per neurosurg - not tracking but moving LEft side spont with minimal movement on right. Today is d#22 s/p ACA coiling.  Neurosrug does not think she has ventriculitis. On ATC  VITAL SIGNS: BP 161/125 mmHg  Pulse 120  Temp(Src) 97.4 F (36.3 C) (Axillary)  Resp 25  Ht 5\' 5"  (1.651 m)  Wt 67.3 kg (148 lb 5.9 oz)  BMI 24.69 kg/m2  SpO2 100%  LMP  (LMP Unknown)  VENTILATOR SETTINGS: Vent Mode:  [-]  FiO2 (%):  [28 %] 28 %  INTAKE / OUTPUT: I/O last 3 completed shifts: In: 3990 [I.V.:1890; NG/GT:1800; IV Piggyback:300] Out: 4183 [Urine:3465; Drains:68; Stool:650]  PHYSICAL EXAMINATION: General: ill appearing Neuro: opens eyes spontaneously, withdraws to pain, not following commands - no changes HEENT: trach site clean Cardiovascular: s1 s2 regular, no murmur Lungs: scattered rhonchi no changes Abdomen: soft, non tender Musculoskeletal: no edema Skin: no rashes.  LABS: PULMONARY No results for input(s): PHART,  PCO2ART, PO2ART, HCO3, TCO2, O2SAT in the last 168 hours.  Invalid input(s): PCO2, PO2  CBC  Recent Labs Lab 01/31/15 0405 02/01/15 0415 02/02/15 0255  HGB 10.8* 11.7* 12.5  HCT 33.1* 35.2* 38.5  WBC 16.3* 15.6* 15.7*  PLT 299 326 338    COAGULATION  Recent Labs Lab 01/26/15 2000  INR 1.00    CARDIAC  No results for input(s): TROPONINI in the last 168 hours. No results for input(s): PROBNP in the last 168 hours.   CHEMISTRY  Recent Labs Lab 01/29/15 0620 01/30/15 0450 01/31/15 0405 02/01/15 0415 02/02/15 0255  NA 134* 137 138 136 141  K 4.1 3.8 3.8 3.9 4.1  CL 100* 100* 102 101 104  CO2 29 27 28 28 27   GLUCOSE 183* 196* 179* 135* 78  BUN 18 20 21* 20 19  CREATININE 0.51 0.59 0.54 0.61 0.50  CALCIUM 9.1 9.4 9.4 9.4 9.7  MG  --   --   --   --  2.0  PHOS  --   --   --   --  4.1   Estimated Creatinine Clearance: 74.9 mL/min (by C-G formula based on Cr of 0.5).   LIVER  Recent Labs Lab 01/26/15 2000  INR 1.00     INFECTIOUS No results for input(s): LATICACIDVEN, PROCALCITON in the last 168 hours.   ENDOCRINE CBG (last 3)   Recent Labs  02/01/15 2312 02/02/15 0352 02/02/15 0842  GLUCAP 188* 101* 91         IMAGING x48h  -  image(s) personally visualized  -   highlighted in bold Dg Chest Port 1 View  02/01/2015  CLINICAL DATA:  Respiratory failure. EXAM: PORTABLE CHEST 1 VIEW COMPARISON:  January 28, 2015. FINDINGS: Stable cardiomediastinal silhouette. No pneumothorax or pleural effusion is noted. Tracheostomy tube is unchanged in position. Distal tip of feeding tube is seen in expected position of distal stomach. Stable position of right internal jugular catheter line with distal tip in expected position of upper right atrium. Both lungs are clear. The visualized skeletal structures are unremarkable. IMPRESSION: Stable support apparatus. No acute cardiopulmonary abnormality seen. Electronically Signed   By: Lupita Raider, M.D.   On:  02/01/2015 12:21        DISCUSSION:   50 yo female smoker presented with altered mental status, Rt pupil dilation from HTN emergency (BP 245/194) and SAH.  ASSESSMENT / PLAN:  NEUROLOGIC A:   Acute encephalopathy 2nd to [facility], s/p coiling >> mental status worse 12/30. Possible seizure prior to admission. TCD 1/2 slight elevation MCA , ACA, low clinical suspicion vasospasm and out of typical window spasm   - no new events P:   IVC drain, AEDs per neurolosurgery Wean off decadron no role Continue nimodipine x 21 days likely we can dc zocor in 1 week also NS Goal MAP > 70  PULMONARY A: Compromise airway 2nd to acute encephalopathy. Tobacco abuse. P:   Trach collar as tolerated Follow secretions pcxr neg infiltrate  CARDIOVASCULAR A:  HTN emergency. Elevated troponin - demand ischemia. P:  MAP goal 70, out of [facility]  RENAL Lab Results  Component Value Date   CREATININE 0.50 02/02/2015   CREATININE 0.61 02/01/2015   CREATININE 0.54 01/31/2015    A:   Urine retention. High output, likely iatrogenic, at risk SIADH, CSW  P:   Monitor renal fx, urine outpt Keep foley in for now >> plan to d/c on 1/04 bethanechol 12/30 Re assess UA, urine osm, na 1/2 contradictory Reduce saline and allow MAP to drop  GASTROINTESTINAL A:   Nutrition. P:   Tube feeds while on vent Protonix for SUP  HEMATOLOGIC  Recent Labs  02/01/15 0415 02/02/15 0255  HGB 11.7* 12.5    A:   Mild anemia, thrombocytopenia of critical illness >> resolved. At risk hemoconcentration  P:  F/u CBC intermittently SCDs for DVT prevention  INFECTIOUS Results for orders placed or performed during the hospital encounter of 01/13/15  MRSA PCR Screening     Status: None   Collection Time: 01/13/15  4:01 PM  Result Value Ref Range Status   MRSA by PCR NEGATIVE NEGATIVE Final    Comment:        The GeneXpert MRSA Assay (FDA approved for NASAL specimens only), is one component of  a comprehensive MRSA colonization surveillance program. It is not intended to diagnose MRSA infection nor to guide or monitor treatment for MRSA infections.   Culture, blood (routine x 2)     Status: None   Collection Time: 01/19/15  9:40 AM  Result Value Ref Range Status   Specimen Description BLOOD LEFT HAND  Final   Special Requests BOTTLES DRAWN AEROBIC ONLY 3CCS  Final   Culture  Setup Time   Final    GRAM POSITIVE COCCI IN CLUSTERS AEROBIC BOTTLE ONLY CRITICAL RESULT CALLED TO, READ BACK BY AND VERIFIED WITH: Patty Sermons RN 10:20 01/20/15 ([email or web address])    Culture   Final    STAPHYLOCOCCUS SPECIES (COAGULASE NEGATIVE) THE SIGNIFICANCE OF ISOLATING THIS ORGANISM  FROM A SINGLE SET OF BLOOD CULTURES WHEN MULTIPLE SETS ARE DRAWN IS UNCERTAIN. PLEASE NOTIFY THE MICROBIOLOGY DEPARTMENT WITHIN ONE WEEK IF SPECIATION AND SENSITIVITIES ARE REQUIRED.    Report Status 01/23/2015 FINAL  Final  Culture, blood (routine x 2)     Status: None   Collection Time: 01/19/15  9:50 AM  Result Value Ref Range Status   Specimen Description BLOOD LEFT HAND  Final   Special Requests IN PEDIATRIC BOTTLE 2CCS  Final   Culture NO GROWTH 5 DAYS  Final   Report Status 01/24/2015 FINAL  Final  Culture, respiratory (NON-Expectorated)     Status: None   Collection Time: 01/19/15  9:50 AM  Result Value Ref Range Status   Specimen Description ENDOTRACHEAL  Final   Special Requests NONE  Final   Gram Stain   Final    MODERATE WBC PRESENT, PREDOMINANTLY PMN NO SQUAMOUS EPITHELIAL CELLS SEEN ABUNDANT GRAM NEGATIVE COCCOBACILLI Performed at [facility]    Culture   Final    ABUNDANT HAEMOPHILUS INFLUENZAE Note: BETA LACTAMASE NEGATIVE Performed at [facility]    Report Status 01/21/2015 FINAL  Final  C difficile quick scan w PCR reflex     Status: None   Collection Time: 01/19/15  2:40 PM  Result Value Ref Range Status   C Diff antigen NEGATIVE NEGATIVE Final   C Diff toxin NEGATIVE  NEGATIVE Final   C Diff interpretation Negative for toxigenic C. difficile  Final  Culture, respiratory (NON-Expectorated)     Status: None (Preliminary result)   Collection Time: 02/01/15 11:15 AM  Result Value Ref Range Status   Specimen Description TRACHEAL ASPIRATE  Final   Special Requests Normal  Final   Gram Stain   Final    ABUNDANT WBC PRESENT,BOTH PMN AND MONONUCLEAR FEW SQUAMOUS EPITHELIAL CELLS PRESENT NO ORGANISMS SEEN Performed at [facility]    Culture   Final    Culture reincubated for better growth Performed at [facility]    Report Status PENDING  Incomplete    A:   Post-IVC placement prophylaxis >> completed 12/17. Tracheobronchitis with H influenzae in sputum from 12/21 >> completed Abx 12/27. Coag neg Staph in blood cx from 12/21 >> contaminate. Fever 12/30. 1/3 low grade temp 100.8 - concern urine   0 - improved fever P:   1/3 pan culture Anti-infectives    Start     Dose/Rate Route Frequency Ordered Stop   02/02/15 0200  vancomycin (VANCOCIN) IVPB 750 mg/150 ml premix     750 mg 150 mL/hr over 60 Minutes Intravenous Every 12 hours 02/01/15 1301     02/01/15 1400  cefTAZidime (FORTAZ) 2 g in dextrose 5 % 50 mL IVPB     2 g 100 mL/hr over 30 Minutes Intravenous 3 times per day 02/01/15 1258     02/01/15 1300  vancomycin (VANCOCIN) 1,500 mg in sodium chloride 0.9 % 500 mL IVPB     1,500 mg 250 mL/hr over 120 Minutes Intravenous  Once 02/01/15 1258 02/01/15 1518   01/23/15 0800  piperacillin-tazobactam (ZOSYN) IVPB 3.375 g     3.375 g 12.5 mL/hr over 240 Minutes Intravenous Every 8 hours 01/23/15 0745 01/25/15 1236   01/19/15 1130  vancomycin (VANCOCIN) IVPB 750 mg/150 ml premix  Status:  Discontinued     750 mg 150 mL/hr over 60 Minutes Intravenous Every 8 hours 01/19/15 1101 01/22/15 1329   01/19/15 1100  piperacillin-tazobactam (ZOSYN) IVPB 3.375 g  Status:  Discontinued     3.375 g 12.5 mL/hr over 240 Minutes Intravenous Every  8 hours 01/19/15 1058 01/22/15 1330   01/13/15 2300  ceFAZolin (ANCEF) IVPB 1 g/50 mL premix  Status:  Discontinued     1 g 100 mL/hr over 30 Minutes Intravenous Every 8 hours 01/13/15 1445 01/15/15 1530   01/13/15 1500  ceFAZolin (ANCEF) IVPB 2 g/50 mL premix     2 g 100 mL/hr over 30 Minutes Intravenous  Once 01/13/15 1445 01/13/15 1545   01/13/15 1445  ceFAZolin (ANCEF) powder 2 g  Status:  Discontinued     2 g Other  Once 01/13/15 1436 01/13/15 1443   01/13/15 1445  ceFAZolin (ANCEF) powder 1 g  Status:  Discontinued     1 g Other 3 times per day 01/13/15 1436 01/13/15 1445      ENDOCRINE A:   Steroid induced hyperglycemia. P:   SSI    Dr. Kalman Shan, M.D., [facility].C.P Pulmonary and Critical Care Medicine Staff Physician [facility] Pager: [phone], If no answer or between  15:00h - 7:00h: call [phone]  02/02/2015 9:57 AM

## 2015-02-02 NOTE — Progress Notes (Signed)
PT Cancellation Note  Patient Details Name: Rachel MinksStephanie N Vang MRN: [id number] DOB: 07/22/1965   Cancelled Treatment:    Reason Eval/Treat Not Completed: Other (comment) (Pt remains on strict bed rest).  Will need updated activity orders before PT evaluation can be completed.  PT will continue to follow acutely.  Michail JewelsAshley Parr PT, DPT [phone] Pager: [phone] 02/02/2015, 9:05 AM

## 2015-02-02 NOTE — Progress Notes (Signed)
Pt seen and examined. No issues overnight. Got PEG today.  EXAM: Temp:  [97.4 F (36.3 C)-101.1 F (38.4 C)] 97.4 F (36.3 C) (01/04 0800) Pulse Rate:  [104-121] 119 (01/04 1210) Resp:  [16-36] 28 (01/04 1353) BP: (124-167)/(83-126) 129/99 mmHg (01/04 1353) SpO2:  [97 %-100 %] 99 % (01/04 1353) FiO2 (%):  [28 %] 28 % (01/04 1210) Weight:  [67.3 kg (148 lb 5.9 oz)] 67.3 kg (148 lb 5.9 oz) (01/04 0340) Intake/Output      01/03 0701 - 01/04 0700 01/04 0701 - 01/05 0700   I.V. (mL/kg) 1210 (18) 100 (1.5)   NG/GT 1025    IV Piggyback 300    Total Intake(mL/kg) 2535 (37.7) 100 (1.5)   Urine (mL/kg/hr) 2165 (1.3) 650 (1.1)   Drains 16 (0) 0 (0)   Stool 450 (0.3)    Total Output 2631 650   Net -96 -550        Urine Occurrence 2 x     Awake, alert Grimaces to pain Not really tracking Spontaneous movements of LUE/LLE Minimal w/d on right EVD in place, ~9cc output over 24hrs @ 15.  LABS: Lab Results  Component Value Date   CREATININE 0.50 02/02/2015   BUN 19 02/02/2015   NA 141 02/02/2015   K 4.1 02/02/2015   CL 104 02/02/2015   CO2 27 02/02/2015   Lab Results  Component Value Date   WBC 15.7* 02/02/2015   HGB 12.5 02/02/2015   HCT 38.5 02/02/2015   MCV 94.8 02/02/2015   PLT 338 02/02/2015    IMAGING: CTH essentially stable, no significant change in ventricular size.  IMPRESSION: - 50 y.o. female SAH d# 22, neurologically stable - Likely UTI  PLAN: - Will clamp EVD today, plan on repeat CT in 24-48 hrs, if stable will d/c drain - Cont abx

## 2015-02-02 NOTE — Anesthesia Preprocedure Evaluation (Addendum)
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 12/16    GI/Hepatic   Endo/Other    Renal/GU      Musculoskeletal   Abdominal   Peds  Hematology   Anesthesia Other Findings   Reproductive/Obstetrics                           Anesthesia Physical Anesthesia Plan  ASA: V  Anesthesia Plan: MAC   Post-op Pain Management:    Induction: Intravenous  Airway Management Planned: Tracheostomy  Additional Equipment:   Intra-op Plan:   Post-operative Plan:   Informed Consent: I have reviewed the patients History and Physical, chart, labs and discussed the procedure including the risks, benefits and alternatives for the proposed anesthesia with the patient or authorized representative who has indicated his/her understanding and acceptance.   Dental advisory given  Plan Discussed with: Anesthesiologist and CRNA  Anesthesia Plan Comments: (Plan for propofol infusion Minimally responsive, has trach in place following neurological injury from hemorrhagic stroke Does have EVD in place and is currently clamped  Now needs sedation during PEG placement for long term GI access)       Anesthesia Quick Evaluation

## 2015-02-02 NOTE — Transfer of Care (Signed)
Immediate Anesthesia Transfer of Care Note  Patient: Lucienne MinksStephanie N Hanska  Procedure(s) Performed: Procedure(s): ESOPHAGOGASTRODUODENOSCOPY (EGD) WITH PROPOFOL (N/A) PERCUTANEOUS ENDOSCOPIC GASTROSTOMY (PEG) PLACEMENT (N/A)  Patient Location: Endoscopy Unit  Anesthesia Type:General  Level of Consciousness: responds to stimulation  Airway & Oxygen Therapy: Patient Spontanous Breathing  Post-op Assessment: Report given to RN and Post -op Vital signs reviewed and stable  Post vital signs: Reviewed and stable  Last Vitals:  Filed Vitals:   02/02/15 0907 02/02/15 1210  BP:  140/98  Pulse: 120 119  Temp:    Resp: 25 27    Complications: No apparent anesthesia complications

## 2015-02-02 NOTE — Progress Notes (Signed)
Clamped drain @ 1520 per Dr. Conchita ParisNundkumar.  Will continue to monitor pt.

## 2015-02-03 ENCOUNTER — Encounter (HOSPITAL_COMMUNITY): Payer: Self-pay | Admitting: General Surgery

## 2015-02-03 DIAGNOSIS — J9620 Acute and chronic respiratory failure, unspecified whether with hypoxia or hypercapnia: Secondary | ICD-10-CM

## 2015-02-03 LAB — CBC WITH DIFFERENTIAL/PLATELET
BASOS ABS: 0 10*3/uL (ref 0.0–0.1)
Basophils Relative: 0 %
EOS ABS: 0 10*3/uL (ref 0.0–0.7)
Eosinophils Relative: 0 %
HEMATOCRIT: 35.2 % — AB (ref 36.0–46.0)
Hemoglobin: 11.7 g/dL — ABNORMAL LOW (ref 12.0–15.0)
LYMPHS ABS: 1.9 10*3/uL (ref 0.7–4.0)
Lymphocytes Relative: 10 %
MCH: 31.9 pg (ref 26.0–34.0)
MCHC: 33.2 g/dL (ref 30.0–36.0)
MCV: 95.9 fL (ref 78.0–100.0)
MONO ABS: 0.9 10*3/uL (ref 0.1–1.0)
Monocytes Relative: 5 %
NEUTROS PCT: 85 %
Neutro Abs: 15.7 10*3/uL — ABNORMAL HIGH (ref 1.7–7.7)
PLATELETS: 311 10*3/uL (ref 150–400)
RBC: 3.67 MIL/uL — AB (ref 3.87–5.11)
RDW: 15.4 % (ref 11.5–15.5)
WBC: 18.5 10*3/uL — AB (ref 4.0–10.5)

## 2015-02-03 LAB — URINE CULTURE: Culture: >=100000

## 2015-02-03 LAB — GLUCOSE, CAPILLARY
GLUCOSE-CAPILLARY: 115 mg/dL — AB (ref 65–99)
GLUCOSE-CAPILLARY: 141 mg/dL — AB (ref 65–99)
GLUCOSE-CAPILLARY: 146 mg/dL — AB (ref 65–99)
Glucose-Capillary: 156 mg/dL — ABNORMAL HIGH (ref 65–99)
Glucose-Capillary: 165 mg/dL — ABNORMAL HIGH (ref 65–99)
Glucose-Capillary: 114 mg/dL — ABNORMAL HIGH (ref 65–99)

## 2015-02-03 LAB — BASIC METABOLIC PANEL
ANION GAP: 11 (ref 5–15)
BUN: 25 mg/dL — ABNORMAL HIGH (ref 6–20)
CO2: 21 mmol/L — ABNORMAL LOW (ref 22–32)
Calcium: 9.3 mg/dL (ref 8.9–10.3)
Chloride: 106 mmol/L (ref 101–111)
Creatinine, Ser: 0.5 mg/dL (ref 0.44–1.00)
Glucose, Bld: 154 mg/dL — ABNORMAL HIGH (ref 65–99)
POTASSIUM: 4.1 mmol/L (ref 3.5–5.1)
SODIUM: 138 mmol/L (ref 135–145)

## 2015-02-03 LAB — PHOSPHORUS: Phosphorus: 3.7 mg/dL (ref 2.5–4.6)

## 2015-02-03 LAB — CULTURE, RESPIRATORY W GRAM STAIN: Culture: NORMAL

## 2015-02-03 LAB — CULTURE, RESPIRATORY: SPECIAL REQUESTS: NORMAL

## 2015-02-03 LAB — MAGNESIUM: MAGNESIUM: 1.9 mg/dL (ref 1.7–2.4)

## 2015-02-03 MED ORDER — DEXAMETHASONE SODIUM PHOSPHATE 4 MG/ML IJ SOLN
1.0000 mg | Freq: Two times a day (BID) | INTRAMUSCULAR | Status: DC
  Administered 2015-02-03 – 2015-02-08 (×11): 1 mg via INTRAVENOUS
  Filled 2015-02-03 (×12): qty 1

## 2015-02-03 MED ORDER — JEVITY 1.2 CAL PO LIQD
1000.0000 mL | ORAL | Status: DC
  Administered 2015-02-03 – 2015-02-08 (×4): 1000 mL
  Administered 2015-02-09: 09:00:00
  Administered 2015-02-09 – 2015-02-12 (×3): 1000 mL
  Filled 2015-02-03 (×10): qty 1000
  Filled 2015-02-03: qty 237
  Filled 2015-02-03 (×5): qty 1000

## 2015-02-03 MED ORDER — MAGNESIUM SULFATE 2 GM/50ML IV SOLN
2.0000 g | Freq: Once | INTRAVENOUS | Status: AC
  Administered 2015-02-03: 2 g via INTRAVENOUS
  Filled 2015-02-03: qty 50

## 2015-02-03 MED ORDER — WHITE PETROLATUM GEL
Status: AC
Start: 2015-02-03 — End: 2015-02-03
  Administered 2015-02-03: 0.2
  Filled 2015-02-03: qty 1

## 2015-02-03 MED ORDER — PRO-STAT SUGAR FREE PO LIQD
30.0000 mL | Freq: Two times a day (BID) | ORAL | Status: DC
  Administered 2015-02-03 – 2015-02-14 (×22): 30 mL
  Filled 2015-02-03 (×22): qty 30

## 2015-02-03 MED ORDER — CEPHALEXIN 250 MG/5ML PO SUSR
500.0000 mg | Freq: Three times a day (TID) | ORAL | Status: AC
  Administered 2015-02-03 – 2015-02-07 (×14): 500 mg via ORAL
  Filled 2015-02-03 (×15): qty 10

## 2015-02-03 NOTE — Progress Notes (Signed)
[facility]  Progress Note   LOS: 21 days   Subjective: Pt awake.  Does not grimace to pain in abdomen.  Nurses/epic documentation says that tube feeds were started 2:37pm.  She has been tolerating tube feeds at 5755mL/hr.  No TF draining around tube.  BM yesterday.  Objective: Vital signs in last 24 hours: Temp:  [98.5 F (36.9 C)-99.5 F (37.5 C)] 98.5 F (36.9 C) (01/05 0400) Pulse Rate:  [94-121] 100 (01/05 0600) Resp:  [23-37] 26 (01/05 0600) BP: (107-168)/(74-106) 116/92 mmHg (01/05 0600) SpO2:  [97 %-100 %] 99 % (01/05 0600) FiO2 (%):  [28 %] 28 % (01/05 0600) Weight:  [68.5 kg (151 lb 0.2 oz)] 68.5 kg (151 lb 0.2 oz) (01/05 0500) Last BM Date: 02/02/15  Lab Results:  CBC  Recent Labs  02/02/15 0255 02/03/15 0249  WBC 15.7* 18.5*  HGB 12.5 11.7*  HCT 38.5 35.2*  PLT 338 311   BMET  Recent Labs  02/02/15 0255 02/03/15 0249  NA 141 138  K 4.1 4.1  CL 104 106  CO2 27 21*  GLUCOSE 78 154*  BUN 19 25*  CREATININE 0.50 0.50  CALCIUM 9.7 9.3    Imaging: Ct Head Wo Contrast  02/02/2015  CLINICAL DATA:  Cerebral edema.  Aneurysm coiling. EXAM: CT HEAD WITHOUT CONTRAST TECHNIQUE: Contiguous axial images were obtained from the base of the skull through the vertex without intravenous contrast. 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 Coil remains in good position. Mild diffuse high-density subarachnoid hemorrhage is unchanged. No new hemorrhage. Right frontal ventricular catheter tip at the foramina [place] unchanged. Ventricles are slightly larger compared with the prior study. Negative for acute infarct.  No shift of the midline structures. Air-fluid level in the sphenoid sinus bilaterally similar to the prior study. 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 No new hemorrhage. Ventricular catheter  remains in good position. Mild ventricular dilatation compared with the recent CT. Electronically Signed   By: Marlan Palauharles  Clark M.D.   On: 02/02/2015 12:29   Dg Chest Port 1 View  02/01/2015  CLINICAL DATA:  Respiratory failure. EXAM: PORTABLE CHEST 1 VIEW COMPARISON:  January 28, 2015. FINDINGS: Stable cardiomediastinal silhouette. No pneumothorax or pleural effusion is noted. Tracheostomy tube is unchanged in position. Distal tip of feeding tube is seen in expected position of distal stomach. Stable position of right internal jugular catheter line with distal tip in expected position of upper right atrium. Both lungs are clear. The visualized skeletal structures are unremarkable. IMPRESSION: Stable support apparatus. No acute cardiopulmonary abnormality seen. Electronically Signed   By: Lupita RaiderJames  Green Jr, M.D.   On: 02/01/2015 12:21     PE: General: pleasant, WD/WN AA female who is laying in bed in NAD Heart: regular, rate, and rhythm.   Lungs: On Trach collar Abd: soft, NT/ND, +BS, PEG tube in LUQ, site is clean, TF going at 7155mL/hr. 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  She was started on TF yesterday afternoon, and has been tolerating them well.  Abdominal binder in place.  Continue flushes, checking residuals.     Jorje GuildMegan Byrl Latin, PA-C Pager: [phone] General Trauma PA Pager: [phone]   02/03/2015

## 2015-02-03 NOTE — Progress Notes (Signed)
PULMONARY / CRITICAL CARE MEDICINE   Name: Rachel Vang MRN: [id number] DOB: 04/14/1965    ADMISSION DATE:  01/13/2015  REFERRING MD:  EDP  CHIEF COMPLAINT:  AMS   LINES/TUBES: 12/15 ETT >> 12/29 12/15 Rt IJ CVL >>12/26 12/15 IVC >>  12/18 R PICC >> 12/23 12/23 R IJ TLC>>>plan 1/3 12/29 Trach (JY) >>   SIGNIFICANT EVENTS: 12/15 Admit, SAH >> coiling distal ACA aneurysm, IVC drain placed 12/15 CT head >> [facility] 12/16 Echo >> severe LVH, EF 60 to 65%, grade 1 diastolic dysfx 12/29 Trach (JY) >> 12/30 Fever, not moving as much 1/2- elevated urine output 02/01/15 -   No change in neurostatus Urine output decreased  Fever Lines removed Foley changed Abx expanded   02/02/15 - fever improved significantly. s/p PEG 02/02/15 by trauma service. . Per neurosurg - not tracking but moving LEft side spont with minimal movement on right. Today is d#22 s/p ACA coiling.  Neurosrug does not think she has ventriculitis. On ATC   SUBJECTIVE/OVERNIGHT/INTERVAL HX 02/03/15 - d#23 s/p ACAP coioling. Afebrile but wbc rising again. EVD now clamped 02/02/15. CT head planned 02/04/15 and if well EVD can be unclamped per neurosurgery. PT eval in progress. Pan sensit e colii in urine. Likely needs placement. Unchanged neuro status  VITAL SIGNS: BP 148/96 mmHg  Pulse 106  Temp(Src) 98.9 F (37.2 C) (Oral)  Resp 25  Ht 5\' 5"  (1.651 m)  Wt 68.5 kg (151 lb 0.2 oz)  BMI 25.13 kg/m2  SpO2 100%  LMP  (LMP Unknown)  VENTILATOR SETTINGS: Vent Mode:  [-]  FiO2 (%):  [28 %] 28 %  INTAKE / OUTPUT: I/O last 3 completed shifts: In: 3466.1 [I.V.:1750; NG/GT:1266.1; IV Piggyback:450] Out: 3025 [Urine:2515; Drains:10; Stool:500]  PHYSICAL EXAMINATION: General: ill appearing Neuro: opens eyes spontaneously, makes faces, spont movement of left, withdraws to pain, not following commands - no changes HEENT: trach site clean Cardiovascular: s1 s2 regular, no murmur Lungs: scattered rhonchi no  changes Abdomen: soft, non tender Musculoskeletal: no edema Skin: no rashes.  LABS: PULMONARY No results for input(s): PHART, PCO2ART, PO2ART, HCO3, TCO2, O2SAT in the last 168 hours.  Invalid input(s): PCO2, PO2  CBC  Recent Labs Lab 02/01/15 0415 02/02/15 0255 02/03/15 0249  HGB 11.7* 12.5 11.7*  HCT 35.2* 38.5 35.2*  WBC 15.6* 15.7* 18.5*  PLT 326 338 311    COAGULATION No results for input(s): INR in the last 168 hours.  CARDIAC  No results for input(s): TROPONINI in the last 168 hours. No results for input(s): PROBNP in the last 168 hours.   CHEMISTRY  Recent Labs Lab 01/30/15 0450 01/31/15 0405 02/01/15 0415 02/02/15 0255 02/03/15 0249  NA 137 138 136 141 138  K 3.8 3.8 3.9 4.1 4.1  CL 100* 102 101 104 106  CO2 27 28 28 27  21*  GLUCOSE 196* 179* 135* 78 154*  BUN 20 21* 20 19 25*  CREATININE 0.59 0.54 0.61 0.50 0.50  CALCIUM 9.4 9.4 9.4 9.7 9.3  MG  --   --   --  2.0 1.9  PHOS  --   --   --  4.1 3.7   Estimated Creatinine Clearance: 80.9 mL/min (by C-G formula based on Cr of 0.5).   LIVER No results for input(s): AST, ALT, ALKPHOS, BILITOT, PROT, ALBUMIN, INR in the last 168 hours.   INFECTIOUS No results for input(s): LATICACIDVEN, PROCALCITON in the last 168 hours.   ENDOCRINE CBG (last 3)   Recent  Labs  02/02/15 2354 02/03/15 0352 02/03/15 0906  GLUCAP 146* 141* 156*         IMAGING x48h  - image(s) personally visualized  -   highlighted in bold Ct Head Wo Contrast  02/02/2015  CLINICAL DATA:  Cerebral edema.  Aneurysm coiling. EXAM: CT HEAD WITHOUT CONTRAST TECHNIQUE: Contiguous axial images were obtained from the base of the skull through the vertex without intravenous contrast. 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 Coil remains in good position. Mild diffuse high-density subarachnoid hemorrhage is unchanged. No new hemorrhage. Right frontal ventricular  catheter tip at the foramina [place] unchanged. Ventricles are slightly larger compared with the prior study. Negative for acute infarct.  No shift of the midline structures. Air-fluid level in the sphenoid sinus bilaterally similar to the prior study. 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 No new hemorrhage. Ventricular catheter remains in good position. Mild ventricular dilatation compared with the recent CT. Electronically Signed   By: Marlan Palauharles  Clark M.D.   On: 02/02/2015 12:29   Dg Chest Port 1 View  02/01/2015  CLINICAL DATA:  Respiratory failure. EXAM: PORTABLE CHEST 1 VIEW COMPARISON:  January 28, 2015. FINDINGS: Stable cardiomediastinal silhouette. No pneumothorax or pleural effusion is noted. Tracheostomy tube is unchanged in position. Distal tip of feeding tube is seen in expected position of distal stomach. Stable position of right internal jugular catheter line with distal tip in expected position of upper right atrium. Both lungs are clear. The visualized skeletal structures are unremarkable. IMPRESSION: Stable support apparatus. No acute cardiopulmonary abnormality seen. Electronically Signed   By: Lupita RaiderJames  Green Jr, M.D.   On: 02/01/2015 12:21        DISCUSSION:   50 yo female smoker presented with altered mental status, Rt pupil dilation from HTN emergency (BP 245/194) and SAH.  ASSESSMENT / PLAN:  NEUROLOGIC A:   [facility], s/p coiling >> mental status worse 12/30. Possible seizure prior to admission. TCD 1/2 slight elevation MCA , ACA, low clinical suspicion vasospasm and out of typical window spasm   - s/p EVD clamping 02/02/15  P:   CT head 02/04/15 and if ok neurosurg will removed EVD drain AEDs per neurolosurgery Wean off decadron no role - dose reduced 02/03/15 Continue nimodipine x 21 days (start date 01/14/15) Continue zocor 80mg  daily x 21 days (start date 01/15/15) - check  CK 02/04/15 NS at 50cc/h Goal MAP > 70  PULMONARY A: Compromise airway 2nd to acute encephalopathy. Tobacco abuse.   - tolerating ATC 02/03/15 P:   Trach collar as tolerated Follow secretions pcxr neg infiltrate  CARDIOVASCULAR A:  HTN emergency. 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 Ur op satisfactory  P:   replet mag Monitor renal fx, urine outpt DC foley 02/03/15 and monitor ( E colii UTI 02/01/15) Bethanechol 12/30 (cholinergic agent for urinary retention) NS at 50cc/h  GASTROINTESTINAL A:   Nutrition.s/p PEG byt Dr Janee Mornhompson 02/02/15  P:   Tube feeds while on vent Protonix for SUP  HEMATOLOGIC  Recent Labs  02/02/15 0255 02/03/15 0249  HGB 12.5 11.7*    A:   Mild anemia, thrombocytopenia of critical illness >> resolved. At risk hemoconcentration  P:  F/u CBC intermittently  SCDs for DVT prevention  INFECTIOUS   CULTURES: MRSAC pcr 12/15 - negative Blood 12/21 >>> coag neg Staph Sputum 12/21 >>> Haemophilus influenzae C diff 12/21 >>> negative ........ Urine 1/3 - pan sensit e colii Blood 1./3 -  Sputum 1/3 - normal flora    A:   Post-IVC placement prophylaxis >> completed 12/17. Tracheobronchitis with H influenzae in sputum from 12/21 >> completed Abx 12/27. Coag neg Staph in blood cx from 12/21 >> contaminate. E Colii UTI 02/01/15   - improved fever curve 02/03/15 afte Rx for e colii UTI 02/01/15  P:    ANTIBIOTICS: 12/15 Cefazolin >> 12/17 Vanc 12/21>>>12/24 Zosyn 12/21>>> 12/27 ............Marland Kitchen vanc 1/3 - 1/5 ceftaz 13 - 1/5 cepharlexin (e coii uti) 1/5  X 5 more days     ENDOCRINE A:   Steroid induced hyperglycemia. P:   SSI   FAMILy  1./5/17- brother updated at bedside. Social worjk and LTAC order placed for disposition,.     Dr. Kalman Shan, M.D., [facility].C.P Pulmonary and Critical Care  Medicine Staff Physician [facility] Pulmonary and Critical Care Pager: [phone], If no answer or between  15:00h - 7:00h: call [phone]  02/03/2015 11:31 AM

## 2015-02-03 NOTE — Evaluation (Signed)
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  pt with Seizure activity per EMS, intubated 12/15 - 12/19, and now post Trach and Peg.  pt with hx of HTN, Etoh, and Medical Non-compliance.   Clinical Impression   Pt admitted with above. She demonstrates the below listed deficits and will benefit from continued OT to maximize safety and independence with BADLs.  Pt presents to OT with Rt gaze preference and Rt neglect, but did track to Rt x 3.  Spontaneous knee/hip extension noted Rt LE x 3 in response to therapist flexing her Rt knee.  She questionable followed 4 one step commands, but there is a possibility that it was coincidentally timed movement as she was unable to replicate this later in session.   Currently, she requires total A for all ADLs.  Unsure of her PLOF, or her social supports at this time as no family is available.   Anticipate she will require SNF level rehab       Follow Up Recommendations  SNF;Supervision/Assistance - 24 hour    Equipment Recommendations  None recommended by OT    Recommendations for Other Services       Precautions / Restrictions Precautions Precautions: Fall;Other (comment) Precaution Comments: Ventric currently clamped.  ?Anoxic Injury as pt unable to be intubated by EMS due to jaw clenched and has to be paralyzed in ED for intubation.        Mobility Bed Mobility Overal bed mobility: Needs Assistance;+2 for physical assistance Bed Mobility: Supine to Sit;Sit to Supine     Supine to sit: Total assist;+2 for physical assistance Sit to supine: Total assist;+2 for physical assistance      Transfers                      Balance                                            ADL Overall ADL's : Needs assistance/impaired Eating/Feeding: NPO   Grooming: Wash/dry face;Bed level;Total assistance Grooming Details (indicate cue type and reason): Pt did not attempt to wash face despite max hand over hand assist  Upper Body Bathing: Total assistance;Bed level   Lower Body Bathing: Total assistance;Bed level   Upper Body Dressing : Total assistance;Bed level   Lower Body Dressing: Total assistance;Bed level   Toilet Transfer: Total assistance   Toileting- Clothing Manipulation and Hygiene: Total assistance;Bed level       Functional mobility during ADLs: Total assistance General ADL Comments: Pt unable to engage in ADL tasks      Vision Additional Comments: Pt with Lt gaze preference.  She will spontaneously and consistently track therapist on her Lt and to midline.  She did track several times just to the Rt of midline.  Head/neck rotated to the Rt.  Pt made eye contact with therapist on her Rt, and did track into the Rt fields when head turned to Rt.   At end of session, pt tracked therapist to the Rt corner of her bed.     Perception Perception Perception Tested?: Yes Perception Deficits: Inattention/neglect Inattention/Neglect: Does not attend to right visual  field;Does not attend to right side of body   Praxis Praxis Praxis tested?: Deficits    Pertinent Vitals/Pain Pain Assessment: Faces Faces Pain Scale: No hurt     Hand Dominance Right   Extremity/Trunk Assessment Upper Extremity Assessment Upper Extremity Assessment: RUE deficits/detail;LUE deficits/detail RUE Deficits / Details: Full PROM.  Pt noted with spasticity vs. minimal spontaneous movement Rt UE after head/neck rotated to the Rt.  RUE Coordination: decreased fine motor;decreased gross motor LUE Deficits / Details: Pt spontaneously moving Lt UE  LUE Coordination: decreased gross motor;decreased fine motor   Lower Extremity Assessment Lower Extremity Assessment:  Defer to PT evaluation RLE Deficits / Details: Pt noted to extend Rt LE x 3 in response to therapist flexing sliding heel up        Communication Communication Communication: Other (comment) (Pt non verbal during OT eval )   Cognition Arousal/Alertness: Awake/alert Behavior During Therapy: Flat affect Overall Cognitive Status: Impaired/Different from baseline Area of Impairment: Attention;Following commands   Current Attention Level: Focused   Following Commands: Follows one step commands inconsistently (questionably followed 4 one step commands )       General Comments: Pt with eyes open.  She potentially followed 4 one step commands to lift her Lt UE up x 2 and to pull it down x 2, and possibly to hold up two fingers.  Occurrences could have been coincidental spontaneous movement timed with the commands as unable to replicate this later in the session    General Comments       Exercises       Shoulder Instructions      Home Living Family/patient expects to be discharged to:: Skilled nursing facility                                 Additional Comments: Pt unable to provide info re: PLOF and no family present       Prior Functioning/Environment          Comments: Assume Independent as notes indicate pt was walking to [employer] PTA.    OT Diagnosis: Generalized weakness;Cognitive deficits;Disturbance of vision;Hemiplegia dominant side   OT Problem List: Decreased strength;Decreased range of motion;Decreased activity tolerance;Impaired balance (sitting and/or standing);Impaired vision/perception;Decreased coordination;Decreased cognition;Decreased safety awareness;Decreased knowledge of use of DME or AE;Impaired sensation;Impaired tone;Impaired UE functional use   OT Treatment/Interventions: Self-care/ADL training;Neuromuscular education;DME and/or AE instruction;Manual therapy;Modalities;Splinting;Therapeutic activities;Cognitive  remediation/compensation;Visual/perceptual remediation/compensation;Patient/family education;Balance training    OT Goals(Current goals can be found in the care plan section) Acute Rehab OT Goals OT Goal Formulation: Patient unable to participate in goal setting Potential to Achieve Goals: Good ADL Goals Additional ADL Goal #1: Pt will follow one step commands at least 50% of the time.  Additional ADL Goal #2: Pt will consistently track therapist/items into Rt visual field with min cues  Additional ADL Goal #3: Family will be independent with PROM Rt UE  Additional ADL Goal #4: Pt will wash face with min A   OT Frequency: Min 2X/week   Barriers to D/C: Other (comment) (unsure of family support )          Co-evaluation              End of Session Nurse Communication: Other (comment) (Pt performance during eval )  Activity Tolerance: Other (comment) (cognitive deficits ) Patient left: in bed;with call bell/phone within reach;with restraints reapplied (Lt mitten applied )   Time: [id number] OT  Time Calculation (min): 23 min Charges:  OT General Charges $OT Visit: 1 Procedure OT Evaluation $OT Eval High Complexity: 1 Procedure OT Treatments $Therapeutic Activity: 8-22 mins G-Codes:    Lemmie Vanlanen M 2015-02-22, 3:51 PM

## 2015-02-03 NOTE — Progress Notes (Signed)
Pt seen and examined. No issues overnight. PEG yesterday, tolerating TF. EVD clamped yesterday without changes  EXAM: Temp:  [98.5 F (36.9 C)-99.5 F (37.5 C)] 98.5 F (36.9 C) (01/05 0400) Pulse Rate:  [94-121] 111 (01/05 0848) Resp:  [23-37] 31 (01/05 0848) BP: (107-168)/(74-106) 138/98 mmHg (01/05 0800) SpO2:  [97 %-100 %] 100 % (01/05 0848) FiO2 (%):  [28 %] 28 % (01/05 0848) Weight:  [68.5 kg (151 lb 0.2 oz)] 68.5 kg (151 lb 0.2 oz) (01/05 0500) Intake/Output      01/04 0701 - 01/05 0700 01/05 0701 - 01/06 0700   I.V. (mL/kg) 1150 (16.8) 100 (1.5)   NG/GT 901.1 55   IV Piggyback 200 50   Total Intake(mL/kg) 2251.1 (32.9) 205 (3)   Urine (mL/kg/hr) 1515 (0.9) 150 (1.1)   Drains 0 (0)    Stool 200 (0.1)    Total Output 1715 150   Net +536.1 +55         Awake, alert Makes facial expressions Not really tracking Spontaneous movements of LUE/LLE Minimal w/d on right EVD in place, clamped  LABS: Lab Results  Component Value Date   CREATININE 0.50 02/03/2015   BUN 25* 02/03/2015   NA 138 02/03/2015   K 4.1 02/03/2015   CL 106 02/03/2015   CO2 21* 02/03/2015   Lab Results  Component Value Date   WBC 18.5* 02/03/2015   HGB 11.7* 02/03/2015   HCT 35.2* 02/03/2015   MCV 95.9 02/03/2015   PLT 311 02/03/2015   IMPRESSION: - 50 y.o. female SAH d# 23, neurologically stable after EVD clamping for <24 hrs - Likely UTI, has been afebrile on abx  PLAN: - Cont EVD clamping. Will plan on repeat CT and d/c tomorrow if remains stable - Cont abx - PT/OT eval/treatment - Will need to work on placement - SW/CMS

## 2015-02-03 NOTE — Progress Notes (Signed)
Received CM consult for SNF placement.  Referral sent to Clinical Social Worker for follow up.    Quintella BatonJulie W. Anitha Kreiser, RN, BSN  Trauma/Neuro ICU Case Manager [phone]

## 2015-02-03 NOTE — Progress Notes (Signed)
Nutrition Follow-up  INTERVENTION:  D/C Vital AF 1.2  Jevity 1.2 @ 50 ml/hr Increase Prostat to 30 ml BID  Provides: 1640 kcal, 97 grams protein, and 972 ml H2O.   NUTRITION DIAGNOSIS:   Inadequate oral intake related to inability to eat as evidenced by NPO status. Ongoing.   GOAL:   Patient will meet greater than or equal to 90% of their needs Met.   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   12/29 trach placed and has been on trach collar since 1/4 PEG placed Medications reviewed and include: imodium Labs reviewed: CBG's: 141-165 Pt discussed during ICU rounds and with RN.  Per RN pt with loose stool, c.diff negative, on imodium, some skin breakdown on buttocks (MASD)  Diet Order:  Diet NPO time specified  Skin:  Wound (see comment) (MASD on buttocks)  Last BM:  12/29 300 ml and unmeasured stool via rectal tube (placed 12/22)  Height:   Ht Readings from Last 1 Encounters:  01/18/15 5' 5"  (1.651 m)   Weight:   Wt Readings from Last 1 Encounters:  02/03/15 151 lb 0.2 oz (68.5 kg)   Ideal Body Weight:  56.8 kg  BMI:  Body mass index is 25.13 kg/(m^2).  Estimated Nutritional Needs:   Kcal:  1600-1800  Protein:  85-100 grams  Fluid:  > 1.6 L/day  EDUCATION NEEDS:   No education needs identified at this time  Geraldine, [place], [place] Pager [phone] After Hours Pager

## 2015-02-03 NOTE — Evaluation (Signed)
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  pt with Seizure activity per EMS, intubated 12/15 - 12/19, and now post Trach and Peg.  pt with hx of HTN, Etoh, and Medical Non-compliance.  Clinical Impression  Pt not following commands at this time.  Pt does visually attend to sound on L side, but only maintains visual attention for seconds.  Pt is able to track to midline, but does not cross midline and does not maintain midline gaze.  Pt does withdrawal to pain in L UE with delay, but on other 3 extremities no withdrawal, only delayed facial grimacing.  Pt on Trach collar throughout session and VSS, however HR maintained in 100 - 120's throughout session.  Pt would benefit from SNF level of care unless she is able to participate more, then possibly CIR.  Will continue to follow.      Follow Up Recommendations SNF    Equipment Recommendations  None recommended by PT    Recommendations for Other Services       Precautions / Restrictions Precautions Precautions: Fall;Other (comment) Precaution Comments: Ventric currently clamped.  ?Anoxic Injury as pt unable to be intubated by EMS due to jaw clenched and has to be paralyzed in ED for intubation.   Restrictions Weight Bearing Restrictions: No      Mobility  Bed Mobility Overal bed mobility: Needs Assistance;+2 for physical assistance Bed Mobility: Supine to Sit;Sit to Supine     Supine to sit: Total assist;+2 for physical assistance Sit to supine: Total assist;+2 for physical assistance   General bed mobility comments: pt without active participation in bed mobility.    Transfers                    Ambulation/Gait                Stairs             Wheelchair Mobility    Modified Rankin (Stroke Patients Only)       Balance Overall balance assessment: Needs assistance Sitting-balance support: Feet supported Sitting balance-Leahy Scale: Zero                                       Pertinent Vitals/Pain Pain Assessment: Faces Faces Pain Scale: No hurt    Home Living Family/patient expects to be discharged to:: Skilled nursing facility                      Prior Function           Comments: Assume Independent as notes indicate pt was walking to [employer] PTA.     Hand Dominance        Extremity/Trunk Assessment   Upper Extremity Assessment: Defer to OT evaluation           Lower Extremity Assessment: RLE deficits/detail;LLE deficits/detail;Difficult to assess due to impaired cognition RLE Deficits / Details: No activie movement noted.  pt does respond to pain with facial grimace, however sluggish and with delay.  No withdrawal.   LLE Deficits / Details: No active movement noted.  pt does respond to pain with facial grimace, however sluggish and  delayed.  No withdrawal.    Cervical / Trunk Assessment: Other exceptions  Communication   Communication:  (Non-verbal throughout session.)  Cognition Arousal/Alertness: Awake/alert Behavior During Therapy: Flat affect Overall Cognitive Status: Difficult to assess Area of Impairment: Following commands       Following Commands:  (Not following any directions)       General Comments: pt Awake, but remains very flat with gaze preference to L side, but can come to midline.  pt does visually attend to sounds on L side, but only maintains attention briefly.  pt without verbalizations throughout session.      General Comments      Exercises        Assessment/Plan    PT Assessment Patient needs continued PT services  PT Diagnosis Generalized weakness;Altered mental status   PT Problem List Decreased strength;Decreased range  of motion;Decreased activity tolerance;Decreased balance;Decreased mobility;Decreased coordination;Decreased cognition;Decreased knowledge of use of DME;Decreased safety awareness;Impaired sensation  PT Treatment Interventions DME instruction;Gait training;Stair training;Functional mobility training;Therapeutic activities;Therapeutic exercise;Balance training;Neuromuscular re-education;Cognitive remediation;Patient/family education   PT Goals (Current goals can be found in the Care Plan section) Acute Rehab PT Goals Patient Stated Goal: pt unable to state. PT Goal Formulation: Patient unable to participate in goal setting Time For Goal Achievement: 02/17/15 Potential to Achieve Goals: Fair    Frequency Min 3X/week   Barriers to discharge        Co-evaluation               End of Session Equipment Utilized During Treatment: Oxygen (Trach collar) Activity Tolerance: Patient tolerated treatment well Patient left: in bed Nurse Communication: Mobility status;Need for lift equipment         Time: [id number] PT Time Calculation (min) (ACUTE ONLY): 25 min   Charges:   PT Evaluation $PT Eval High Complexity: 1 Procedure PT Treatments $Therapeutic Activity: 8-22 mins   PT G CodesSunny Schlein:        Marycarmen Hagey F, [place]PT [phone] 02/03/2015, 10:48 AM

## 2015-02-04 ENCOUNTER — Inpatient Hospital Stay (HOSPITAL_COMMUNITY): Payer: Medicaid Other

## 2015-02-04 ENCOUNTER — Encounter (HOSPITAL_COMMUNITY): Payer: Self-pay

## 2015-02-04 DIAGNOSIS — Z9289 Personal history of other medical treatment: Secondary | ICD-10-CM

## 2015-02-04 DIAGNOSIS — G919 Hydrocephalus, unspecified: Secondary | ICD-10-CM | POA: Insufficient documentation

## 2015-02-04 LAB — CBC WITH DIFFERENTIAL/PLATELET
Basophils Absolute: 0 10*3/uL (ref 0.0–0.1)
Basophils Relative: 0 %
Eosinophils Absolute: 0.2 10*3/uL (ref 0.0–0.7)
Eosinophils Relative: 2 %
HCT: 32.4 % — ABNORMAL LOW (ref 36.0–46.0)
HEMOGLOBIN: 10.5 g/dL — AB (ref 12.0–15.0)
LYMPHS ABS: 2.3 10*3/uL (ref 0.7–4.0)
LYMPHS PCT: 21 %
MCH: 31.2 pg (ref 26.0–34.0)
MCHC: 32.4 g/dL (ref 30.0–36.0)
MCV: 96.1 fL (ref 78.0–100.0)
Monocytes Absolute: 0.7 10*3/uL (ref 0.1–1.0)
Monocytes Relative: 6 %
NEUTROS PCT: 71 %
Neutro Abs: 8 10*3/uL — ABNORMAL HIGH (ref 1.7–7.7)
Platelets: 337 10*3/uL (ref 150–400)
RBC: 3.37 MIL/uL — AB (ref 3.87–5.11)
RDW: 15.7 % — ABNORMAL HIGH (ref 11.5–15.5)
WBC: 11.3 10*3/uL — AB (ref 4.0–10.5)

## 2015-02-04 LAB — BASIC METABOLIC PANEL
Anion gap: 9 (ref 5–15)
BUN: 21 mg/dL — AB (ref 6–20)
CHLORIDE: 111 mmol/L (ref 101–111)
CO2: 22 mmol/L (ref 22–32)
Calcium: 9.2 mg/dL (ref 8.9–10.3)
Creatinine, Ser: 0.55 mg/dL (ref 0.44–1.00)
GFR calc Af Amer: >60 mL/min (ref >60)
GFR calc non Af Amer: >60 mL/min (ref >60)
GLUCOSE: 143 mg/dL — AB (ref 65–99)
POTASSIUM: 3.7 mmol/L (ref 3.5–5.1)
Sodium: 142 mmol/L (ref 135–145)

## 2015-02-04 LAB — GLUCOSE, CAPILLARY
GLUCOSE-CAPILLARY: 131 mg/dL — AB (ref 65–99)
GLUCOSE-CAPILLARY: 138 mg/dL — AB (ref 65–99)
Glucose-Capillary: 102 mg/dL — ABNORMAL HIGH (ref 65–99)
Glucose-Capillary: 94 mg/dL (ref 65–99)
Glucose-Capillary: 101 mg/dL — ABNORMAL HIGH (ref 65–99)
Glucose-Capillary: 124 mg/dL — ABNORMAL HIGH (ref 65–99)

## 2015-02-04 LAB — CK TOTAL AND CKMB (NOT AT ARMC)
CK, MB: 5.4 ng/mL — AB (ref 0.5–5.0)
RELATIVE INDEX: INVALID (ref 0.0–2.5)
Total CK: 33 U/L — ABNORMAL LOW (ref 38–234)

## 2015-02-04 LAB — PHOSPHORUS: Phosphorus: 3.5 mg/dL (ref 2.5–4.6)

## 2015-02-04 LAB — MAGNESIUM: MAGNESIUM: 1.9 mg/dL (ref 1.7–2.4)

## 2015-02-04 MED ORDER — METOPROLOL TARTRATE 1 MG/ML IV SOLN
5.0000 mg | INTRAVENOUS | Status: DC | PRN
  Administered 2015-02-04 – 2015-02-08 (×6): 5 mg via INTRAVENOUS
  Filled 2015-02-04 (×5): qty 5

## 2015-02-04 MED ORDER — METOPROLOL TARTRATE 1 MG/ML IV SOLN
INTRAVENOUS | Status: AC
  Filled 2015-02-04: qty 5

## 2015-02-04 NOTE — Progress Notes (Signed)
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  pt with Seizure activity per EMS, intubated 12/15 - 12/19, and now post Trach and Peg.  pt with hx of HTN, Etoh, and Medical Non-compliance.   OT comments  Pt less responsive today.  Did not follow commands, or appear to follow commands.  Did track therapist into Rt visual field and does make eye contact at midline, but no movement noted Rt UE or LE today.  Will follow.   Follow Up Recommendations  SNF;Supervision/Assistance - 24 hour    Equipment Recommendations  None recommended by OT    Recommendations for Other Services      Precautions / Restrictions Precautions Precautions: Fall;Other (comment) Precaution Comments: Ventric currently clamped.  ?Anoxic Injury as pt unable to be intubated by EMS due to jaw clenched and has to be paralyzed in ED for intubation.         Mobility Bed Mobility   Bed Mobility: Rolling Rolling: Total assist            Transfers                      Balance                                   ADL Overall ADL's : Needs assistance/impaired Eating/Feeding: NPO   Grooming: Wash/dry face;Bed level Grooming Details (indicate cue type and reason): Attempted hand over hand assist, but pt resists moving washcloth to face.  Does not make any attempt to assist with washing face                              Functional mobility during ADLs: Total assistance        Vision                 Additional Comments: Pt continues with Lt gaze preference.  She will consistently make eye contact at midline and will track occasionally into Rt field.      Perception     Praxis      Cognition   Behavior During  Therapy: Flat affect Overall Cognitive Status: Impaired/Different from baseline Area of Impairment: Attention   Current Attention Level: Focused            General Comments: Pt with eyes open 50% of time.  She will make eye contact, and did track to Rt.  She did not attempt, or appear to attempt to follow any commands    Extremity/Trunk Assessment               Exercises Other Exercises Other Exercises: PROM neck to Lt with pt grimmacing.  PROM Rt UE, no movement noted this date    Shoulder Instructions       General Comments      Pertinent Vitals/ Pain       Pain Assessment: Faces Faces Pain Scale: Hurts little more Pain Location: neck with ROM to Rt  Pain Descriptors / Indicators: Grimacing;Guarding Pain Intervention(s): Monitored during session;Repositioned  Home Living  Prior Functioning/Environment              Frequency Min 2X/week     Progress Toward Goals  OT Goals(current goals can now be found in the care plan section)  Progress towards OT goals: Not progressing toward goals - comment  ADL Goals Additional ADL Goal #1: Pt will follow one step commands at least 50% of the time.  Additional ADL Goal #2: Pt will consistently track therapist/items into Rt visual field with min cues  Additional ADL Goal #3: Family will be independent with PROM Rt UE  Additional ADL Goal #4: Pt will wash face with min A   Plan Discharge plan remains appropriate    Co-evaluation                 End of Session     Activity Tolerance Other (comment) (impaired cognition )   Patient Left in bed;with call bell/phone within reach   Nurse Communication Other (comment) (performance with OT )        Time: 1324-4010: 1134-1148 OT Time Calculation (min): 14 min  Charges: OT General Charges $OT Visit: 1 Procedure OT Treatments $Therapeutic Activity: 8-22 mins  Jaylaa Gallion M 02/04/2015, 1:49 PM

## 2015-02-04 NOTE — Progress Notes (Signed)
Pt seen and examined. No issues overnight. EVD remains clamped without changes.  EXAM: Temp:  [98.2 F (36.8 C)-99.6 F (37.6 C)] 99.6 F (37.6 C) (01/06 0800) Pulse Rate:  [93-108] 99 (01/06 0900) Resp:  [18-33] 21 (01/06 0900) BP: (110-145)/(76-97) 137/90 mmHg (01/06 0900) SpO2:  [98 %-100 %] 100 % (01/06 0900) FiO2 (%):  [28 %] 28 % (01/06 1000) Weight:  [69.8 kg (153 lb 14.1 oz)] 69.8 kg (153 lb 14.1 oz) (01/06 0418) Intake/Output      01/05 0701 - 01/06 0700 01/06 0701 - 01/07 0700   I.V. (mL/kg) 1200 (17.2) 200 (2.9)   Other  125   NG/GT 1088.3 200   IV Piggyback 100    Total Intake(mL/kg) 2388.3 (34.2) 525 (7.5)   Urine (mL/kg/hr) 1710 (1) 185 (0.7)   Drains     Stool 950 (0.6)    Total Output 2660 185   Net -271.7 +340         Awake, alert Makes facial expressions Not really tracking Spontaneous movements of LUE/LLE Minimal w/d on right EVD in place, clamped  LABS: Lab Results  Component Value Date   CREATININE 0.55 02/04/2015   BUN 21* 02/04/2015   NA 142 02/04/2015   K 3.7 02/04/2015   CL 111 02/04/2015   CO2 22 02/04/2015   Lab Results  Component Value Date   WBC 11.3* 02/04/2015   HGB 10.5* 02/04/2015   HCT 32.4* 02/04/2015   MCV 96.1 02/04/2015   PLT 337 02/04/2015   IMPRESSION: - 50 y.o. female SAH d# 24, neurologically stable after EVD clamping for nearly 48 hrs. - Likely UTI, has been afebrile on abx, leukocytosis improving.  PLAN: - Repeat CT, will likely d/c EVD - Cont abx - Will plan on SNF placement assuming we're able to remove EVD

## 2015-02-04 NOTE — Progress Notes (Signed)
PULMONARY / CRITICAL CARE MEDICINE   Name: Rachel Vang MRN: [id number] DOB: March 24, 1965    ADMISSION DATE:  01/13/2015  REFERRING MD:  EDP  CHIEF COMPLAINT:  AMS   LINES/TUBES: 12/15 ETT >> 12/29 12/15 Rt IJ CVL >>12/26 12/15 IVC >>  12/18 R PICC >> 12/23 12/23 R IJ TLC>>>plan 1/3 12/29 Trach (JY) >>   SIGNIFICANT EVENTS: 12/15 Admit, SAH >> coiling distal ACA aneurysm, IVC drain placed 12/15 CT head >> [facility] 12/16 Echo >> severe LVH, EF 60 to 65%, grade 1 diastolic dysfx 12/29 Trach (JY) >> 12/30 Fever, not moving as much 1/2- elevated urine output 02/01/15 -   No change in neurostatus Urine output decreased  Fever Lines removed Foley changed Abx expanded   02/02/15 - fever improved significantly. s/p PEG 02/02/15 by trauma service. . Per neurosurg - not tracking but moving LEft side spont with minimal movement on right. Today is d#22 s/p ACA coiling.  Neurosrug does not think she has ventriculitis. On ATC   SUBJECTIVE/OVERNIGHT/INTERVAL HX 02/03/15 - d#23 s/p ACAP coioling. Afebrile but wbc rising again. EVD now clamped 02/02/15. CT head planned 02/04/15 and if well EVD can be unclamped per neurosurgery. PT eval in progress. Pan sensit e colii in urine. Likely needs placement. Unchanged neuro status  VITAL SIGNS: BP 137/90 mmHg  Pulse 99  Temp(Src) 99.6 F (37.6 C) (Oral)  Resp 21  Ht 5\' 5"  (1.651 m)  Wt 153 lb 14.1 oz (69.8 kg)  BMI 25.61 kg/m2  SpO2 100%  LMP  (LMP Unknown)  VENTILATOR SETTINGS: Vent Mode:  [-]  FiO2 (%):  [28 %] 28 %  INTAKE / OUTPUT: I/O last 3 completed shifts: In: 3798.3 [I.V.:1750; NG/GT:1748.3; IV Piggyback:300] Out: 3390 [Urine:2365; Stool:1025]  PHYSICAL EXAMINATION: General: ill appearing Neuro: opens eyes spontaneously, makes faces, spont movement of left, withdraws to pain, not following commands - no changes HEENT: trach site clean Cardiovascular: s1 s2 regular, no murmur Lungs: scattered rhonchi no changes Abdomen: soft,  non tender Musculoskeletal: no edema Skin: no rashes.  LABS: PULMONARY No results for input(s): PHART, PCO2ART, PO2ART, HCO3, TCO2, O2SAT in the last 168 hours.  Invalid input(s): PCO2, PO2  CBC  Recent Labs Lab 02/02/15 0255 02/03/15 0249 02/04/15 0542  HGB 12.5 11.7* 10.5*  HCT 38.5 35.2* 32.4*  WBC 15.7* 18.5* 11.3*  PLT 338 311 337    COAGULATION No results for input(s): INR in the last 168 hours.  CARDIAC  No results for input(s): TROPONINI in the last 168 hours. No results for input(s): PROBNP in the last 168 hours.   CHEMISTRY  Recent Labs Lab 01/31/15 0405 02/01/15 0415 02/02/15 0255 02/03/15 0249 02/04/15 0542  NA 138 136 141 138 142  K 3.8 3.9 4.1 4.1 3.7  CL 102 101 104 106 111  CO2 28 28 27  21* 22  GLUCOSE 179* 135* 78 154* 143*  BUN 21* 20 19 25* 21*  CREATININE 0.54 0.61 0.50 0.50 0.55  CALCIUM 9.4 9.4 9.7 9.3 9.2  MG  --   --  2.0 1.9 1.9  PHOS  --   --  4.1 3.7 3.5   Estimated Creatinine Clearance: 81.6 mL/min (by C-G formula based on Cr of 0.55).   LIVER No results for input(s): AST, ALT, ALKPHOS, BILITOT, PROT, ALBUMIN, INR in the last 168 hours.   INFECTIOUS No results for input(s): LATICACIDVEN, PROCALCITON in the last 168 hours.   ENDOCRINE CBG (last 3)   Recent Labs  02/03/15 1944 02/03/15  2337 02/04/15 0331  GLUCAP 115* 131* 102*         IMAGING x48h  - image(s) personally visualized  -   highlighted in bold Ct Head Wo Contrast  02/02/2015  CLINICAL DATA:  Cerebral edema.  Aneurysm coiling. EXAM: CT HEAD WITHOUT CONTRAST TECHNIQUE: Contiguous axial images were obtained from the base of the skull through the vertex without intravenous contrast. 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 Coil remains in good position. Mild diffuse high-density subarachnoid hemorrhage is unchanged. No new hemorrhage. Right frontal ventricular catheter tip at the  foramina [place] unchanged. Ventricles are slightly larger compared with the prior study. Negative for acute infarct.  No shift of the midline structures. Air-fluid level in the sphenoid sinus bilaterally similar to the prior study. 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 No new hemorrhage. Ventricular catheter remains in good position. Mild ventricular dilatation compared with the recent CT. Electronically Signed   By: Marlan Palauharles  Clark M.D.   On: 02/02/2015 12:29        DISCUSSION:   50 yo female smoker presented with altered mental status, Rt pupil dilation from HTN emergency (BP 245/194) and SAH.  ASSESSMENT / PLAN:  NEUROLOGIC A:   Ac[facility]nd [facility], s/p coiling >> mental status worse 12/30. Possible seizure prior to admission. TCD 1/2 slight elevation MCA , ACA, low clinical suspicion vasospasm and out of typical window spasm   - s/p EVD clamping 02/02/15  P:   CT head 02/04/15 and if ok neurosurg will removed EVD drain AEDs per neurolosurgery Wean off decadron no role - dose reduced 02/03/15 Continue nimodipine x 21 days (start date 01/14/15) Continue zocor 80mg  daily x 21 days (start date 01/15/15) - check CK 02/04/15 NS at 50cc/h Goal MAP > 70  PULMONARY A: Compromise airway 2nd to acute encephalopathy. Tobacco abuse.   - tolerating ATC 02/04/15 P:   Trach collar as tolerated Follow secretions pcxr neg infiltrate  CARDIOVASCULAR A:  HTN emergency. 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 Ur op satisfactory  P:   replet mag Monitor renal fx, urine outpt DC foley 02/03/15 and monitor ( E colii UTI 02/01/15) Bethanechol 12/30 (cholinergic agent for urinary retention) NS at 50cc/h  GASTROINTESTINAL A:   Nutrition.s/p PEG byt Dr Janee Mornhompson 02/02/15  P:    Tube feeds while on vent Protonix for SUP  HEMATOLOGIC  Recent Labs  02/03/15 0249 02/04/15 0542  HGB 11.7* 10.5*    A:   Mild anemia, thrombocytopenia of critical illness >> resolved. At risk hemoconcentration  P:  F/u CBC intermittently SCDs for DVT prevention  INFECTIOUS   CULTURES: MRSAC pcr 12/15 - negative Blood 12/21 >>> coag neg Staph Sputum 12/21 >>> Haemophilus influenzae C diff 12/21 >>> negative ........ Urine 1/3 - pan sensitive e colii Blood 1./3 -  Sputum 1/3 - normal flora    A:   Post-IVC placement prophylaxis >> completed 12/17. Tracheobronchitis with H influenzae in sputum from 12/21 >> completed Abx 12/27. Coag neg Staph in blood cx from 12/21 >> contaminate. E Colii UTI 02/01/15   - improved fever curve  after Rx for e colii UTI 02/01/15  P:    ANTIBIOTICS:  Vanc 12/21>>>12/24 Zosyn 12/21>>> 12/27 .............Marland Kitchen  vanc 1/3 - 1/5 ceftaz 13 - 1/5 cepharlexin (e coii uti) 1/5  X 5 more days     ENDOCRINE A:   Steroid induced hyperglycemia. P:   SSI   FAMILy  1./5/17- brother updated at bedside. Social worjk and LTAC order placed for disposition,.     Brett Canales Minor ACNP [facility] PCCM Pager [phone] till 3 pm If no answer page [phone] 02/04/2015, 10:13 AM   STAFF NOTE: I, Rory Percy, MD FACP have personally reviewed patient's available data, including medical history, events of note, physical examination and test results as part of my evaluation. I have discussed with resident/NP and other care providers such as pharmacist, RN and RRT. In addition, I personally evaluated patient and elicited key findings of: she is more alert overall, maintain trach collar, EVD per NS, for repeat imaging clamped, continued abx keflex for urine, consider total 7 days and repeat UA at end, limit foley when able, if EVD out can move to sdu, follow stool output that was an issue this week, cdiff neg, low threshold to repeat, fevers better after ID  urine, no role HHH at this stage  Mcarthur Rossetti. Tyson Alias, MD, FACP Pgr: [phone],  [facility] 02/04/2015 2:19 PM

## 2015-02-05 ENCOUNTER — Inpatient Hospital Stay (HOSPITAL_COMMUNITY): Payer: Medicaid Other

## 2015-02-05 DIAGNOSIS — R509 Fever, unspecified: Secondary | ICD-10-CM

## 2015-02-05 DIAGNOSIS — A047 Enterocolitis due to Clostridium difficile: Secondary | ICD-10-CM

## 2015-02-05 DIAGNOSIS — R000 Tachycardia, unspecified: Secondary | ICD-10-CM

## 2015-02-05 LAB — URINALYSIS, ROUTINE W REFLEX MICROSCOPIC
Bilirubin Urine: NEGATIVE
Glucose, UA: NEGATIVE mg/dL
Ketones, ur: NEGATIVE mg/dL
NITRITE: NEGATIVE
PROTEIN: NEGATIVE mg/dL
SPECIFIC GRAVITY, URINE: 1.024 (ref 1.005–1.030)
pH: 5 (ref 5.0–8.0)

## 2015-02-05 LAB — BASIC METABOLIC PANEL
Anion gap: 10 (ref 5–15)
BUN: 17 mg/dL (ref 6–20)
CHLORIDE: 106 mmol/L (ref 101–111)
CO2: 23 mmol/L (ref 22–32)
CREATININE: 0.54 mg/dL (ref 0.44–1.00)
Calcium: 9.3 mg/dL (ref 8.9–10.3)
GFR calc Af Amer: >60 mL/min (ref >60)
GFR calc non Af Amer: >60 mL/min (ref >60)
GLUCOSE: 139 mg/dL — AB (ref 65–99)
POTASSIUM: 4.1 mmol/L (ref 3.5–5.1)
Sodium: 139 mmol/L (ref 135–145)

## 2015-02-05 LAB — CBC WITH DIFFERENTIAL/PLATELET
Basophils Absolute: 0 10*3/uL (ref 0.0–0.1)
Basophils Relative: 0 %
EOS PCT: 0 %
Eosinophils Absolute: 0 10*3/uL (ref 0.0–0.7)
HCT: 34 % — ABNORMAL LOW (ref 36.0–46.0)
HEMOGLOBIN: 10.9 g/dL — AB (ref 12.0–15.0)
LYMPHS ABS: 1.7 10*3/uL (ref 0.7–4.0)
LYMPHS PCT: 11 %
MCH: 30.7 pg (ref 26.0–34.0)
MCHC: 32.1 g/dL (ref 30.0–36.0)
MCV: 95.8 fL (ref 78.0–100.0)
MONOS PCT: 7 %
Monocytes Absolute: 1 10*3/uL (ref 0.1–1.0)
Neutro Abs: 12.6 10*3/uL — ABNORMAL HIGH (ref 1.7–7.7)
Neutrophils Relative %: 82 %
PLATELETS: 283 10*3/uL (ref 150–400)
RBC: 3.55 MIL/uL — AB (ref 3.87–5.11)
RDW: 15.5 % (ref 11.5–15.5)
WBC: 15.4 10*3/uL — ABNORMAL HIGH (ref 4.0–10.5)

## 2015-02-05 LAB — PHOSPHORUS: Phosphorus: 3.7 mg/dL (ref 2.5–4.6)

## 2015-02-05 LAB — LACTIC ACID, PLASMA
LACTIC ACID, VENOUS: 1.8 mmol/L (ref 0.5–2.0)
LACTIC ACID, VENOUS: 1.8 mmol/L (ref 0.5–2.0)

## 2015-02-05 LAB — URINE MICROSCOPIC-ADD ON

## 2015-02-05 LAB — GLUCOSE, CAPILLARY
GLUCOSE-CAPILLARY: 141 mg/dL — AB (ref 65–99)
GLUCOSE-CAPILLARY: 190 mg/dL — AB (ref 65–99)
GLUCOSE-CAPILLARY: 157 mg/dL — AB (ref 65–99)
Glucose-Capillary: 173 mg/dL — ABNORMAL HIGH (ref 65–99)
Glucose-Capillary: 152 mg/dL — ABNORMAL HIGH (ref 65–99)
Glucose-Capillary: 183 mg/dL — ABNORMAL HIGH (ref 65–99)

## 2015-02-05 LAB — PROCALCITONIN: Procalcitonin: 0.41 ng/mL

## 2015-02-05 LAB — MAGNESIUM: Magnesium: 1.8 mg/dL (ref 1.7–2.4)

## 2015-02-05 MED ORDER — SODIUM CHLORIDE 0.9 % IV BOLUS (SEPSIS): 1000.0000 mL | Freq: Once | INTRAVENOUS | Status: DC

## 2015-02-05 MED ORDER — VANCOMYCIN 50 MG/ML ORAL SOLUTION
500.0000 mg | Freq: Four times a day (QID) | ORAL | Status: DC
  Administered 2015-02-05 – 2015-02-13 (×31): 500 mg via ORAL
  Filled 2015-02-05 (×37): qty 10

## 2015-02-05 MED ORDER — SODIUM CHLORIDE 0.9 % IV BOLUS (SEPSIS)
500.0000 mL | Freq: Once | INTRAVENOUS | Status: AC
Start: 2015-02-05 — End: 2015-02-05
  Administered 2015-02-05: 500 mL via INTRAVENOUS

## 2015-02-05 MED ORDER — FAMOTIDINE 40 MG/5ML PO SUSR
20.0000 mg | Freq: Two times a day (BID) | ORAL | Status: DC
  Administered 2015-02-05 – 2015-02-14 (×17): 20 mg
  Filled 2015-02-05 (×22): qty 2.5

## 2015-02-05 NOTE — Progress Notes (Signed)
Sutures removed from trach with no complications. Pt stable throughout. RT will continue to monitor.

## 2015-02-05 NOTE — Progress Notes (Signed)
eLink Physician-Brief Progress Note Patient Name: Rachel MinksStephanie N Koslowski DOB: 05/20/1965 MRN: [id number]   Date of Service  02/05/2015  HPI/Events of Note  c diff pos  eICU Interventions  Stop PPI, start H2 blocker for sup     Intervention Category Intermediate Interventions: Other:  Shan Levansatrick Wright 02/05/2015, 6:53 PM

## 2015-02-05 NOTE — Progress Notes (Signed)
CT head reviewed, no significant changes. Ventricular size remains stable. EVD d/c'ed without complication.

## 2015-02-05 NOTE — Progress Notes (Signed)
No acute events EVD removed yesterday 1 time fever to 101.4 yesterday Neurologically unchanged Leukocytosis with white count 15 Neurologically stable We'll order chest x-ray, UA, lactic acid, and give normal saline bolus

## 2015-02-05 NOTE — Progress Notes (Signed)
PULMONARY / CRITICAL CARE MEDICINE   Name: Rachel Vang MRN: [id number] DOB: 10/28/1965    ADMISSION DATE:  01/13/2015  REFERRING MD:  EDP  CHIEF COMPLAINT:  AMS   LINES/TUBES: 12/15 ETT >> 12/29 12/15 Rt IJ CVL >>12/26 12/15 IVC >> 1/6 12/18 R PICC >> 12/23 12/23 R IJ TLC>>>plan 1/3 12/29 Trach (JY) >>   SIGNIFICANT EVENTS: 12/15 Admit, SAH >> coiling distal ACA aneurysm, IVC drain placed 12/15 CT [facility] 12/16 Echo >> severe LVH, EF 60 to 65%, grade 1 diastolic dysfx 12/29 Trach (JY) >> 12/30 Fever, not moving as much 1/2- elevated urine output 02/01/15 -   No change in neurostatus Urine output decreased  Fever Lines removed Foley changed Abx expanded   02/02/15 - fever improved significantly. s/p PEG 02/02/15 by trauma service. . Per neurosurg - not tracking but moving LEft side spont with minimal movement on right. Today is d#22 s/p ACA coiling.  Neurosrug does not think she has ventriculitis. On ATC   SUBJECTIVE/OVERNIGHT/INTERVAL HX 02/03/15 - d#23 s/p ACAP coioling. Afebrile but wbc rising again. EVD now clamped 02/02/15. CT head planned 02/04/15 and if well EVD can be unclamped per neurosurgery. PT eval in progress. Pan sensit e colii in urine. Likely needs placement. Unchanged neuro status  VITAL SIGNS: BP 124/91 mmHg  Pulse 115  Temp(Src) 100.3 F (37.9 C) (Axillary)  Resp 30  Ht 5\' 5"  (1.651 m)  Wt 151 lb 7.3 oz (68.7 kg)  BMI 25.20 kg/m2  SpO2 100%  LMP  (LMP Unknown)  VENTILATOR SETTINGS: Vent Mode:  [-]  FiO2 (%):  [28 %] 28 %  INTAKE / OUTPUT: I/O last 3 completed shifts: In: 3725 [I.V.:1800; Other:125; NG/GT:1800] Out: 4155 [Urine:3195; Stool:960]  PHYSICAL EXAMINATION: General: ill appearing Neuro: opens eyes spontaneously, makes faces, spont movement of left, withdraws to pain, not following commands - no changes. IVC drain out HEENT: trach site clean Cardiovascular: s1 s2 regular, no murmur st 133 Lungs: scattered rhonchi no  changes Abdomen: soft, non tender Musculoskeletal: no edema Skin: no rashes.  LABS: PULMONARY No results for input(s): PHART, PCO2ART, PO2ART, HCO3, TCO2, O2SAT in the last 168 hours.  Invalid input(s): PCO2, PO2  CBC  Recent Labs Lab 02/03/15 0249 02/04/15 0542 02/05/15 0233  HGB 11.7* 10.5* 10.9*  HCT 35.2* 32.4* 34.0*  WBC 18.5* 11.3* 15.4*  PLT 311 337 283    COAGULATION No results for input(s): INR in the last 168 hours.  CARDIAC  No results for input(s): TROPONINI in the last 168 hours. No results for input(s): PROBNP in the last 168 hours.   CHEMISTRY  Recent Labs Lab 02/01/15 0415 02/02/15 0255 02/03/15 0249 02/04/15 0542 02/05/15 0233  NA 136 141 138 142 139  K 3.9 4.1 4.1 3.7 4.1  CL 101 104 106 111 106  CO2 28 27 21* 22 23  GLUCOSE 135* 78 154* 143* 139*  BUN 20 19 25* 21* 17  CREATININE 0.61 0.50 0.50 0.55 0.54  CALCIUM 9.4 9.7 9.3 9.2 9.3  MG  --  2.0 1.9 1.9 1.8  PHOS  --  4.1 3.7 3.5 3.7   Estimated Creatinine Clearance: 81 mL/min (by C-G formula based on Cr of 0.54).   LIVER No results for input(s): AST, ALT, ALKPHOS, BILITOT, PROT, ALBUMIN, INR in the last 168 hours.   INFECTIOUS No results for input(s): LATICACIDVEN, PROCALCITON in the last 168 hours.   ENDOCRINE CBG (last 3)   Recent Labs  02/04/15 2319 02/05/15  0303 02/05/15 0807  GLUCAP 173* 141* 152*         IMAGING x48h  - image(s) personally visualized  -   highlighted in bold Ct Head Wo Contrast  02/04/2015  CLINICAL DATA:  Followup scan.  EVD clamping for 48 hours. EXAM: CT HEAD WITHOUT CONTRAST TECHNIQUE: Contiguous axial images were obtained from the base of the skull through the vertex without intravenous contrast. COMPARISON:  02/02/2015 FINDINGS: Ventriculostomy catheter position is stable, tip near the foramina [place]. Persistent frothy secretions and mucosal thickening in the sphenoid sinuses in this patient with previous nasal intubation. No new  calvarial or orbital findings. 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 There is mild lateral ventriculomegaly, best seen at the temporal horns, which is stable. No third or fourth ventricular enlargement. Scattered subarachnoid hemorrhage which is much improved compared to admission and stable from most recent scan. An ovoid hematoma along the anterior interhemispheric fissure is stable in size and density is fading. Stable low-density in the bilateral inferior frontal lobes. No evidence of acute infarct. IMPRESSION: 1. Stable mild lateral ventriculomegaly. 2. Stable subarachnoid hemorrhage compared to 2 days ago. 3. Sphenoid sinusitis Electronically Signed   By: Marnee Spring M.D.   On: 02/04/2015 15:47   Dg Chest Port 1 View  02/05/2015  CLINICAL DATA:  50 year old female with fever EXAM: PORTABLE CHEST 1 VIEW COMPARISON:  Prior chest x-ray obtained earlier today at 6 a.m. FINDINGS: Tracheostomy tube remains in stable position. Unchanged cardiomegaly. Unchanged bilateral retrocardiac airspace opacities with a linear configuration slightly more prominent on the left than the right remains unchanged. No acute osseous abnormality. IMPRESSION: No interval change in the appearance of the chest compared to earlier this morning. Persistent left greater than right basilar atelectasis. Electronically Signed   By: Malachy Moan M.D.   On: 02/05/2015 08:55   Dg Chest Port 1 View  02/05/2015  CLINICAL DATA:  Respiratory failure EXAM: PORTABLE CHEST 1 VIEW COMPARISON:  02/01/2015 chest radiograph. FINDINGS: Tracheostomy tube tip overlies the tracheal air column 3.5 cm above the carina. Stable cardiomediastinal silhouette with mild cardiomegaly. No pneumothorax. No pleural effusion. Stable mild platelike atelectasis at the left lung base. No pulmonary edema. IMPRESSION:  Well-positioned tracheostomy tube. Stable mild cardiomegaly without overt pulmonary edema. Stable mild platelike atelectasis at the left lung base. Electronically Signed   By: Delbert Phenix M.D.   On: 02/05/2015 07:42        DISCUSSION:   50 yo female smoker presented with altered mental status, Rt pupil dilation from HTN emergency (BP 245/194) and SAH.  ASSESSMENT / PLAN:  NEUROLOGIC A:   Acute encephalopathy 2nd to [facility], s/p coiling >> mental status worse 12/30. Possible seizure prior to admission. TCD 1/2 slight elevation MCA , ACA, low clinical suspicion vasospasm and out of typical window spasm   - s/p EVD clamping 02/02/15  P:   CT head 02/04/15 and if ok neurosurg removed EVD drain 1/6 AEDs per neurolosurgery Wean off decadron no role - dose reduced 02/03/15 Continue nimodipine x 21 days (start date 01/14/15) Continue zocor 80mg  daily x 21 days (start date 01/15/15) - check CK 02/04/15 NS at 50cc/h Goal MAP > 70  PULMONARY A: Compromise airway 2nd to acute encephalopathy. Tobacco abuse.   - tolerating ATC 02/04/15 P:   Trach collar as tolerated Follow secretions pcxr neg infiltrate  CARDIOVASCULAR A:  HTN emergency. 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 Ur op satisfactory  P:   replet mag Monitor renal fx, urine outpt DC foley 02/03/15 and monitor ( E colii UTI 02/01/15) Bethanechol 12/30 (cholinergic agent for urinary retention) NS at 50cc/h  GASTROINTESTINAL A:   Nutrition.s/p PEG byt Dr Janee Morn 02/02/15  P:   Tube feeds while on vent Protonix for SUP  HEMATOLOGIC  Recent Labs  02/04/15 0542 02/05/15 0233  HGB 10.5* 10.9*    A:   Mild anemia, thrombocytopenia of critical  illness >> resolved. At risk hemoconcentration  P:  F/u CBC intermittently SCDs for DVT prevention  INFECTIOUS   CULTURES: MRSAC pcr 12/15 - negative Blood 12/21 >>> coag neg Staph Sputum 12/21 >>> Haemophilus influenzae C diff 12/21 >>> negative ........ Urine 1/3 - pan sensitive e colii Blood 1./3 - ngtd on 1/7 Sputum 1/3 - normal flora ------ 1/7 uc>> 1/7 bc x 2>> 1/7 c dif>>   A:   Post-IVC placement prophylaxis >> completed 12/17. IVC removed 1/6 Tracheobronchitis with H influenzae in sputum from 12/21 >> completed Abx 12/27. Coag neg Staph in blood cx from 12/21 >> contaminate. E Colii UTI 02/01/15   - improved fever curve  after Rx for e colii UTI 02/01/15 - 1/7 t max 100.3 axillary`on decadron therefore will pan culture, IVC out therefore unable to culture csf.  P:    ANTIBIOTICS:  Vanc 12/21>>>12/24 Zosyn 12/21>>> 12/27 ............Marland Kitchen vanc 1/3 - 1/5 ceftaz 13 - 1/5 cepharlexin (e coii uti) 1/5  X 5 days, stop date 1/10 Hold off on new abx for now 1/7     ENDOCRINE CBG (last 3)   Recent Labs  02/04/15 2319 02/05/15 0303 02/05/15 0807  GLUCAP 173* 141* 152*     A:   Steroid induced hyperglycemia. P:   SSI   Family: 1./5/17- brother updated at bedside. Social worjk and LTAC order placed for disposition,.     Brett Canales Minor ACNP [facility] PCCM Pager [phone] till 3 pm If no answer page [phone] 02/05/2015, 9:25 AM

## 2015-02-06 ENCOUNTER — Inpatient Hospital Stay (HOSPITAL_COMMUNITY): Payer: Medicaid Other

## 2015-02-06 LAB — GLUCOSE, CAPILLARY
GLUCOSE-CAPILLARY: 151 mg/dL — AB (ref 65–99)
GLUCOSE-CAPILLARY: 106 mg/dL — AB (ref 65–99)
GLUCOSE-CAPILLARY: 162 mg/dL — AB (ref 65–99)
Glucose-Capillary: 154 mg/dL — ABNORMAL HIGH (ref 65–99)
Glucose-Capillary: 146 mg/dL — ABNORMAL HIGH (ref 65–99)
Glucose-Capillary: 105 mg/dL — ABNORMAL HIGH (ref 65–99)
Glucose-Capillary: 166 mg/dL — ABNORMAL HIGH (ref 65–99)

## 2015-02-06 LAB — BASIC METABOLIC PANEL
Anion gap: 9 (ref 5–15)
BUN: 18 mg/dL (ref 6–20)
CALCIUM: 9.3 mg/dL (ref 8.9–10.3)
CHLORIDE: 115 mmol/L — AB (ref 101–111)
CO2: 18 mmol/L — ABNORMAL LOW (ref 22–32)
CREATININE: 0.54 mg/dL (ref 0.44–1.00)
GFR calc non Af Amer: >60 mL/min (ref >60)
Glucose, Bld: 182 mg/dL — ABNORMAL HIGH (ref 65–99)
Potassium: 3.8 mmol/L (ref 3.5–5.1)
SODIUM: 142 mmol/L (ref 135–145)

## 2015-02-06 LAB — CULTURE, BLOOD (ROUTINE X 2)
Culture: NO GROWTH
Culture: NO GROWTH

## 2015-02-06 LAB — PHOSPHORUS: PHOSPHORUS: 2.6 mg/dL (ref 2.5–4.6)

## 2015-02-06 LAB — URINE CULTURE: CULTURE: NO GROWTH

## 2015-02-06 LAB — C DIFFICILE QUICK SCREEN W PCR REFLEX
C DIFFICILE (CDIFF) TOXIN: POSITIVE — AB
C DIFFICLE (CDIFF) ANTIGEN: POSITIVE — AB
C Diff interpretation: POSITIVE

## 2015-02-06 LAB — CBC WITH DIFFERENTIAL/PLATELET
BASOS ABS: 0 10*3/uL (ref 0.0–0.1)
BASOS PCT: 0 %
EOS ABS: 0 10*3/uL (ref 0.0–0.7)
Eosinophils Relative: 0 %
HCT: 32.3 % — ABNORMAL LOW (ref 36.0–46.0)
Hemoglobin: 11.1 g/dL — ABNORMAL LOW (ref 12.0–15.0)
LYMPHS ABS: 1.4 10*3/uL (ref 0.7–4.0)
Lymphocytes Relative: 5 %
MCH: 32.9 pg (ref 26.0–34.0)
MCHC: 34.4 g/dL (ref 30.0–36.0)
MCV: 95.8 fL (ref 78.0–100.0)
MONO ABS: 1.4 10*3/uL — AB (ref 0.1–1.0)
Monocytes Relative: 5 %
NEUTROS ABS: 24.2 10*3/uL — AB (ref 1.7–7.7)
Neutrophils Relative %: 90 %
Platelets: 367 10*3/uL (ref 150–400)
RBC: 3.37 MIL/uL — ABNORMAL LOW (ref 3.87–5.11)
RDW: 16.6 % — AB (ref 11.5–15.5)
WBC: 27 10*3/uL — ABNORMAL HIGH (ref 4.0–10.5)

## 2015-02-06 LAB — PROCALCITONIN: Procalcitonin: 4.79 ng/mL

## 2015-02-06 LAB — MAGNESIUM: MAGNESIUM: 1.9 mg/dL (ref 1.7–2.4)

## 2015-02-06 NOTE — Progress Notes (Signed)
No acute events MAXIMUM TEMPERATURE 101 Tachycardic Neurologically unchanged Found to have C. difficile in stool Neurologically stable Medical management per PCCM

## 2015-02-06 NOTE — Progress Notes (Signed)
PULMONARY / CRITICAL CARE MEDICINE   Name: Rachel Vang MRN: [id number] DOB: December 15, 1965    ADMISSION DATE:  01/13/2015  REFERRING MD:  EDP  CHIEF COMPLAINT:  AMS   LINES/TUBES: 12/15 ETT >> 12/29 12/15 Rt IJ CVL >>12/26 12/15 IVC >> 1/6 12/18 R PICC >> 12/23 12/23 R IJ TLC>>>plan 1/3 12/29 Trach (JY) >>   SIGNIFICANT EVENTS: 12/15 Admit, SAH >> coiling distal ACA aneurysm, IVC drain placed 12/15 CT head >> [facility] 12/16 Echo >> severe LVH, EF 60 to 65%, grade 1 diastolic dysfx 12/29 Trach (JY) >> 12/30 Fever, not moving as much 1/2- elevated urine output 02/01/15 -   No change in neurostatus Urine output decreased  Fever Lines removed Foley changed Abx expanded 1/7 + c diff  02/02/15 - fever improved significantly. s/p PEG 02/02/15 by trauma service. . Per neurosurg - not tracking but moving LEft side spont with minimal movement on right. Today is d#22 s/p ACA coiling.  Neurosrug does not think she has ventriculitis. On ATC   SUBJECTIVE/OVERNIGHT/INTERVAL HX 02/03/15 - d#23 s/p ACAP coioling. Afebrile but wbc rising again. EVD now clamped 02/02/15. CT head planned 02/04/15 and if well EVD can be unclamped per neurosurgery. PT eval in progress. Pan sensit e colii in urine. Likely needs placement. Unchanged neuro status 1/8 for [facility] when bed available  VITAL SIGNS: BP 117/85 mmHg  Pulse 126  Temp(Src) 99.6 F (37.6 C) (Axillary)  Resp 21  Ht 5\' 5"  (1.651 m)  Wt 147 lb 7.8 oz (66.9 kg)  BMI 24.54 kg/m2  SpO2 100%  LMP  (LMP Unknown)  VENTILATOR SETTINGS: Vent Mode:  [-]  FiO2 (%):  [28 %] 28 %  INTAKE / OUTPUT: I/O last 3 completed shifts: In: 4110 [I.V.:1810; NG/GT:1800; IV Piggyback:500] Out: 3405 [Urine:2820; Drains:400; Stool:185]  PHYSICAL EXAMINATION: General: ill appearing, nsc Neuro: opens eyes spontaneously, makes faces, spont movement of left, withdraws to pain, not following commands - no changes. IVC drain out HEENT: trach site clean, suture  closed Cardiovascular: s1 s2 regular, no murmur st 133 Lungs: scattered rhonchi no changes Abdomen: soft, non tender Musculoskeletal: no edema Skin: no rashes.  LABS: PULMONARY No results for input(s): PHART, PCO2ART, PO2ART, HCO3, TCO2, O2SAT in the last 168 hours.  Invalid input(s): PCO2, PO2  CBC  Recent Labs Lab 02/04/15 0542 02/05/15 0233 02/06/15 0442  HGB 10.5* 10.9* 11.1*  HCT 32.4* 34.0* 32.3*  WBC 11.3* 15.4* 27.0*  PLT 337 283 367    COAGULATION No results for input(s): INR in the last 168 hours.  CARDIAC  No results for input(s): TROPONINI in the last 168 hours. No results for input(s): PROBNP in the last 168 hours.   CHEMISTRY  Recent Labs Lab 02/02/15 0255 02/03/15 0249 02/04/15 0542 02/05/15 0233 02/06/15 0442  NA 141 138 142 139 142  K 4.1 4.1 3.7 4.1 3.8  CL 104 106 111 106 115*  CO2 27 21* 22 23 18*  GLUCOSE 78 154* 143* 139* 182*  BUN 19 25* 21* 17 18  CREATININE 0.50 0.50 0.55 0.54 0.54  CALCIUM 9.7 9.3 9.2 9.3 9.3  MG 2.0 1.9 1.9 1.8 1.9  PHOS 4.1 3.7 3.5 3.7 2.6   Estimated Creatinine Clearance: 74.9 mL/min (by C-G formula based on Cr of 0.54).   LIVER No results for input(s): AST, ALT, ALKPHOS, BILITOT, PROT, ALBUMIN, INR in the last 168 hours.   INFECTIOUS  Recent Labs Lab 02/05/15 1125 02/05/15 1435 02/06/15 0442  LATICACIDVEN 1.8 1.8  --  PROCALCITON 0.41  --  4.79     ENDOCRINE CBG (last 3)   Recent Labs  02/05/15 2254 02/06/15 0413 02/06/15 0730  GLUCAP 154* 151* 106*         IMAGING x48h  - image(s) personally visualized  -   highlighted in bold Ct Head Wo Contrast  02/04/2015  CLINICAL DATA:  Followup scan.  EVD clamping for 48 hours. EXAM: CT HEAD WITHOUT CONTRAST TECHNIQUE: Contiguous axial images were obtained from the base of the skull through the vertex without intravenous contrast. COMPARISON:  02/02/2015 FINDINGS: Ventriculostomy catheter position is stable, tip near the [place].  Persistent frothy secretions and mucosal thickening in the sphenoid sinuses in this patient with previous nasal intubation. No new calvarial or orbital findings. 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 There is mild lateral ventriculomegaly, best seen at the temporal horns, which is stable. No third or fourth ventricular enlargement. Scattered subarachnoid hemorrhage which is much improved compared to admission and stable from most recent scan. An ovoid hematoma along the anterior interhemispheric fissure is stable in size and density is fading. Stable low-density in the bilateral inferior frontal lobes. No evidence of acute infarct. IMPRESSION: 1. Stable mild lateral ventriculomegaly. 2. Stable subarachnoid hemorrhage compared to 2 days ago. 3. Sphenoid sinusitis Electronically Signed   By: Marnee SpringJonathon  Watts M.D.   On: 02/04/2015 15:47   Dg Chest Port 1 View  02/06/2015  CLINICAL DATA:  Patient with history of fever. EXAM: PORTABLE CHEST 1 VIEW COMPARISON:  Chest radiograph 02/05/2015. FINDINGS: Tracheostomy tube terminates the mid trachea. Multiple monitoring leads overlie the patient. Stable enlarged cardiac and mediastinal contours. Elevation of the right hemidiaphragm. Unchanged heterogeneous opacities left lung base. No pleural effusion or pneumothorax. IMPRESSION: Unchanged retrocardiac opacity which may represent atelectasis or infection. Electronically Signed   By: Annia Beltrew  Davis M.D.   On: 02/06/2015 08:51   Dg Chest Port 1 View  02/05/2015  CLINICAL DATA:  50 year old female with fever EXAM: PORTABLE CHEST 1 VIEW COMPARISON:  Prior chest x-ray obtained earlier today at 6 a.m. FINDINGS: Tracheostomy tube remains in stable position. Unchanged cardiomegaly. Unchanged bilateral retrocardiac airspace opacities with a linear configuration slightly more prominent on the  left than the right remains unchanged. No acute osseous abnormality. IMPRESSION: No interval change in the appearance of the chest compared to earlier this morning. Persistent left greater than right basilar atelectasis. Electronically Signed   By: Malachy MoanHeath  McCullough M.D.   On: 02/05/2015 08:55   Dg Chest Port 1 View  02/05/2015  CLINICAL DATA:  Respiratory failure EXAM: PORTABLE CHEST 1 VIEW COMPARISON:  02/01/2015 chest radiograph. FINDINGS: Tracheostomy tube tip overlies the tracheal air column 3.5 cm above the carina. Stable cardiomediastinal silhouette with mild cardiomegaly. No pneumothorax. No pleural effusion. Stable mild platelike atelectasis at the left lung base. No pulmonary edema. IMPRESSION: Well-positioned tracheostomy tube. Stable mild cardiomegaly without overt pulmonary edema. Stable mild platelike atelectasis at the left lung base. Electronically Signed   By: Delbert PhenixJason A Poff M.D.   On: 02/05/2015 07:42        DISCUSSION:   50 yo female smoker presented with altered mental status, Rt pupil dilation from HTN emergency (BP 245/194) and SAH.  ASSESSMENT / PLAN:  NEUROLOGIC A:   [facility]ute [facility], s/p coiling >> mental status worse 12/30. Possible seizure prior to admission. TCD 1/2 slight elevation MCA , ACA, low  clinical suspicion vasospasm and out of typical window spasm   - s/p EVD clamping 02/02/15  P:   CT head 02/04/15 and if ok neurosurg removed EVD drain 1/6 AEDs per neurolosurgery Wean off decadron no role - dose reduced 02/03/15 Continue nimodipine x 21 days (start date 01/14/15) Continue zocor 80mg  daily x 21 days (start date 01/15/15) - check CK 02/04/15 NS at 50cc/h Goal MAP > 70  PULMONARY A: Compromise airway 2nd to acute encephalopathy. Tobacco abuse.   - tolerating ATC 02/04/15 P:   Trach collar as tolerated Follow secretions pcxr neg infiltrate  CARDIOVASCULAR A:  HTN emergency. Elevated troponin  Admit 12/15- demand ischemia (normal echo  01/14/15) 1/7 st 133 with bp150/100, PRN lopressor ordered P:  MAP goal 70, out of [facility] PRN lopressor  RENAL  Recent Labs Lab 02/04/15 0542 02/05/15 0233 02/06/15 0442  NA 142 139 142    A:   At risk SIADH, CSW    02/03/15 - Mild low mag < 2gm%. Ur op satisfactory  P:   replet mag Monitor renal fx, urine outpt DC foley 02/03/15 and monitor ( E colii UTI 02/01/15) Bethanechol 12/30 (cholinergic agent for urinary retention) NS at 50cc/h  GASTROINTESTINAL A:   Nutrition.s/p PEG byt Dr Janee Morn 02/02/15  P:   Tube feeds while on vent Protonix for SUP  HEMATOLOGIC  Recent Labs  02/05/15 0233 02/06/15 0442  HGB 10.9* 11.1*    A:   Mild anemia, thrombocytopenia of critical illness >> resolved. At risk hemoconcentration  P:  F/u CBC intermittently SCDs for DVT prevention  INFECTIOUS   CULTURES: MRSAC pcr 12/15 - negative Blood 12/21 >>> coag neg Staph Sputum 12/21 >>> Haemophilus influenzae C diff 12/21 >>> negative ........ Urine 1/3 - pan sensitive e colii Blood 1./3 - ngtd on 1/7 Sputum 1/3 - normal flora ------ 1/7 uc>> 1/7 bc x 2>> 1/7 c dif>>++   A:   Post-IVC placement prophylaxis >> completed 12/17. IVC removed 1/6 Tracheobronchitis with H influenzae in sputum from 12/21 >> completed Abx 12/27. Coag neg Staph in blood cx from 12/21 >> contaminate. E Colii UTI 02/01/15   - improved fever curve  after Rx for e colii UTI 02/01/15 - 1/7 t max 100.3 axillary`on decadron therefore will pan culture, IVC out therefore unable to culture csf. 1/7 ++cdif  P:    ANTIBIOTICS:  Vanc 12/21>>>12/24 Zosyn 12/21>>> 12/27 ............Marland Kitchen vanc 1/3 - 1/5 ceftaz 13 - 1/5 cepharlexin (e coii uti) 1/5  X 5 days, stop date 1/10 consider dc now with c diff Hold off on new abx for now 1/7 1/7 po vanc for c dif>>     ENDOCRINE CBG (last 3)   Recent Labs  02/05/15 2254 02/06/15 0413 02/06/15 0730  GLUCAP 154* 151* 106*     A:   Steroid induced  hyperglycemia. P:   SSI   Family: 02/03/15- brother updated at bedside. Social worjk and LTAC order placed for disposition,.     Brett Canales Verma Grothaus ACNP [facility] PCCM Pager [phone] till 3 pm If no answer page [phone] 02/06/2015, 11:17 AM

## 2015-02-07 ENCOUNTER — Other Ambulatory Visit (HOSPITAL_COMMUNITY): Payer: Self-pay

## 2015-02-07 LAB — GLUCOSE, CAPILLARY
GLUCOSE-CAPILLARY: 112 mg/dL — AB (ref 65–99)
GLUCOSE-CAPILLARY: 125 mg/dL — AB (ref 65–99)
Glucose-Capillary: 109 mg/dL — ABNORMAL HIGH (ref 65–99)
Glucose-Capillary: 133 mg/dL — ABNORMAL HIGH (ref 65–99)

## 2015-02-07 LAB — PROCALCITONIN: PROCALCITONIN: 2.37 ng/mL

## 2015-02-07 MED ORDER — LIDOCAINE HCL (PF) 1 % IJ SOLN
INTRAMUSCULAR | Status: AC
  Filled 2015-02-07: qty 5

## 2015-02-07 NOTE — Progress Notes (Signed)
PULMONARY / CRITICAL CARE MEDICINE   Name: Rachel Vang MRN: [id number] DOB: 01/04/1966    ADMISSION DATE:  01/13/2015  REFERRING MD:  EDP  CHIEF COMPLAINT:  Altered mental status  SUBJECTIVE: Fever curve better.  VITAL SIGNS: BP 127/93 mmHg  Pulse 104  Temp(Src) 97.9 F (36.6 C) (Oral)  Resp 25  Ht 5\' 5"  (1.651 m)  Wt 147 lb 4.3 oz (66.8 kg)  BMI 24.51 kg/m2  SpO2 100%  LMP  (LMP Unknown)  INTAKE / OUTPUT: I/O last 3 completed shifts: In: 3528.3 [I.V.:1628.3; Other:100; NG/GT:1800] Out: 2670 [Urine:2270; Stool:400]  PHYSICAL EXAMINATION: General: awake Neuro: no following commands HEENT: trach site clean Cardiac: regular, tachycardic Chest: no wheeze Abdomen: G tube site clean Ext: no edema Skin: no rashes   CBC Recent Labs     02/05/15  0233  02/06/15  0442  WBC  15.4*  27.0*  HGB  10.9*  11.1*  HCT  34.0*  32.3*  PLT  283  367    BMET Recent Labs     02/05/15  0233  02/06/15  0442  NA  139  142  K  4.1  3.8  CL  106  115*  CO2  23  18*  BUN  17  18  CREATININE  0.54  0.54  GLUCOSE  139*  182*    Electrolytes Recent Labs     02/05/15  0233  02/06/15  0442  CALCIUM  9.3  9.3  MG  1.8  1.9  PHOS  3.7  2.6    Sepsis Markers Recent Labs     02/05/15  1125  02/06/15  0442  02/07/15  0248  PROCALCITON  0.41  4.79  2.37    Glucose Recent Labs     02/06/15  0730  02/06/15  1136  02/06/15  1629  02/06/15  1933  02/06/15  2310  02/07/15  0407  GLUCAP  106*  146*  162*  105*  166*  109*    Imaging Dg Chest Port 1 View  02/06/2015  CLINICAL DATA:  Patient with history of fever. EXAM: PORTABLE CHEST 1 VIEW COMPARISON:  Chest radiograph 02/05/2015. FINDINGS: Tracheostomy tube terminates the mid trachea. Multiple monitoring leads overlie the patient. Stable enlarged cardiac and mediastinal contours. Elevation of the right hemidiaphragm. Unchanged heterogeneous opacities left lung base. No pleural effusion or pneumothorax.  IMPRESSION: Unchanged retrocardiac opacity which may represent atelectasis or infection. Electronically Signed   By: Annia Beltrew  Davis M.D.   On: 02/06/2015 08:51    LINES/TUBES: 12/15 ETT >> 12/29 12/15 Rt IJ CVL >>12/26 12/15 IVC >> 1/6 12/18 R PICC >> 12/23 12/23 R IJ TLC>>>plan 1/3 12/29 Trach (JY) >>  STUDIES: 12/15 CT head [facility] 12/16 Echo >> severe LVH, EF 60 to 65%, grade 1 diastolic dysfx  SIGNIFICANT EVENTS: 12/15 Admit, SAH >> coiling distal ACA aneurysm, IVC drain placed 12/29 Trach (JY) >> 12/30 Fever 01/03 Fever >> lines changed 01/07 C diff positive 01/09 To telemetry  DISCUSSION:   50 yo female smoker presented with altered mental status, Rt pupil dilation from HTN emergency (BP 245/194) and SAH.  ASSESSMENT / PLAN:  [facility]ute [facility], s/p coiling. Possible seizure prior to admission. P:   Decadron, AED's per neurosurgery Nimotop for 21 days >> started 01/14/15 Zocor for 21 days >> started 01/15/15 Continue NS at 50 ml/hr for now  Compromise airway 2nd to acute encephalopathy. Tobacco abuse. P:   Trach collar as tolerated  HTN emergency >> resolved. Elevated troponin on admission - Likely demand ischemia (normal echo 01/14/15). Sinus tachycardia in setting of sepsis. P:  Monitor hemodynamics  Dysphagia s/p G tube. P: Continue tube feeds  Haemophilus influenzae tracheobronchitis 12/21 >> completed therapy. E coli UTI 1/03. C diff colitis 1/07. P: Day 2 of enteral vancomycin D/c keflex after dose on 1/09 for UTI  Steroid induced hyperglycemia. P: SSI  Irritable bladder. P: Continue bethanechol  DVT prophylaxis >> SCDs SUP >> Pepcid Goals of care >> full code Disposition >> To telemetry.  Will need SNF when okay with neurosurgery.  Will ask [facility] to assume care from 1/10 and PCCM will follow for tracheostomy care.  Coralyn Helling, MD [facility] Pulmonary/Critical Care 02/07/2015, 10:10 AM Pager:  [phone] After 3pm call:  [phone]

## 2015-02-07 NOTE — Care Management Note (Signed)
Case Management Note  Patient Details  Name: Rachel Vang MRN: [id number] Date of Birth: 08/15/1965  Subjective/Objective:  Pt s/p SAH s/p coiling with respiratory failure requiring tracheostomy on 01/27/15.                   Action/Plan: Noted CM referral for LTAC, but pt has no insurance.  Will need SNF placement at dc with LOG.  CSW following pt to facilitate this.    Expected Discharge Date:         Expected Discharge Plan:  Skilled Nursing Facility  In-House Referral:  Clinical Social Work  Discharge planning Services  CM Consult  Post Acute Care Choice:    Choice offered to:     DME Arranged:    DME Agency:     HH Arranged:    HH Agency:     Status of Service:  In process, will continue to follow  Medicare Important Message Given:    Date Medicare IM Given:    Medicare IM give by:    Date Additional Medicare IM Given:    Additional Medicare Important Message give by:     If discussed at Long Length of Stay Meetings, dates discussed:    Additional Comments:  Quintella BatonJulie W. Addilyne Backs, RN, BSN  Trauma/Neuro ICU Case Manager [phone]

## 2015-02-07 NOTE — Progress Notes (Signed)
No issues overnight.  EXAM:  BP 156/97 mmHg  Pulse 100  Temp(Src) 98.8 F (37.1 C) (Axillary)  Resp 17  Ht 5\' 5"  (1.651 m)  Wt 66.8 kg (147 lb 4.3 oz)  BMI 24.51 kg/m2  SpO2 100%  LMP  (LMP Unknown)  Awake, alert Not following commands Moves LUE/LLE spontaneously Minimal W/D on right  IMPRESSION:  [age] y.o. female s/p distal ACA coiling C diff colitis  PLAN: - Cont abx - placement

## 2015-02-07 NOTE — Progress Notes (Signed)
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  pt with Seizure activity per EMS, intubated 12/15 - 12/19, and now post Trach and Peg.  pt with hx of HTN, Etoh, and Medical Non-compliance.    PT Comments    Pt continues to be non-verbal and without participation in mobility.  Pt did seem to follow one direction today, but unable to replicate.  Pt with strong trunk/neck rotation towards L side and resists rotation to R side.  Continue to feel pt will need SNF level of care at D/C.    Follow Up Recommendations  SNF     Equipment Recommendations  None recommended by PT    Recommendations for Other Services       Precautions / Restrictions Precautions Precautions: Fall Precaution Comments: ?Anoxic Injury as pt unable to be intubated by EMS due to jaw clenched and has to be paralyzed in ED for intubation.   Restrictions Weight Bearing Restrictions: No    Mobility  Bed Mobility Overal bed mobility: Needs Assistance;+2 for physical assistance Bed Mobility: Supine to Sit;Sit to Supine;Rolling Rolling: Total assist;+2 for physical assistance   Supine to sit: Total assist;+2 for physical assistance Sit to supine: Total assist;+2 for physical assistance   General bed mobility comments: pt without active participation in bed mobility.    Transfers                    Ambulation/Gait                 Stairs            Wheelchair Mobility    Modified Rankin (Stroke Patients Only)       Balance Overall balance assessment: Needs assistance Sitting-balance support: No upper extremity supported;Feet supported Sitting balance-Leahy Scale: Zero                              Cognition  Arousal/Alertness: Lethargic Behavior During Therapy: Flat affect Overall Cognitive Status: Impaired/Different from baseline Area of Impairment: Following commands       Following Commands: Follows one step commands inconsistently (? followed one direction)       General Comments: pt seemed to follow one direction today, though unable to replicate.  pt with L sided gaze preference, but did track past midline towards R side, but only minimally to R.  No verbalizations throughout session.      Exercises      General Comments        Pertinent Vitals/Pain Pain Assessment: Faces Faces Pain Scale: Hurts little more Pain Location: Grimaces when attempting neck ROM to R side.   Pain Descriptors / Indicators: Grimacing;Guarding Pain Intervention(s): Monitored during session;Repositioned    Home Living                      Prior Function            PT Goals (current goals can now be found in the care plan section) Acute Rehab PT Goals Patient Stated Goal: pt unable to state. PT Goal Formulation: Patient unable to participate in goal setting Time For Goal Achievement: 02/17/15 Potential to Achieve Goals: Fair Progress towards PT goals: Progressing toward goals  Frequency  Min 3X/week    PT Plan Current plan remains appropriate    Co-evaluation             End of Session Equipment Utilized During Treatment: Oxygen (Trach Collar) Activity Tolerance: Patient tolerated treatment well Patient left: in bed     Time: 9604-5409 PT Time Calculation (min) (ACUTE ONLY): 27 min  Charges:  $Therapeutic Activity: 23-37 mins                    G CodesSunny Vang, Rachel Vang 02/07/2015, 3:02 PM

## 2015-02-08 DIAGNOSIS — I610 Nontraumatic intracerebral hemorrhage in hemisphere, subcortical: Secondary | ICD-10-CM

## 2015-02-08 LAB — GLUCOSE, CAPILLARY
GLUCOSE-CAPILLARY: 144 mg/dL — AB (ref 65–99)
GLUCOSE-CAPILLARY: 113 mg/dL — AB (ref 65–99)
GLUCOSE-CAPILLARY: 165 mg/dL — AB (ref 65–99)
Glucose-Capillary: 134 mg/dL — ABNORMAL HIGH (ref 65–99)
Glucose-Capillary: 153 mg/dL — ABNORMAL HIGH (ref 65–99)
Glucose-Capillary: 165 mg/dL — ABNORMAL HIGH (ref 65–99)
Glucose-Capillary: 83 mg/dL (ref 65–99)

## 2015-02-08 LAB — BASIC METABOLIC PANEL
Anion gap: 8 (ref 5–15)
BUN: 20 mg/dL (ref 6–20)
CALCIUM: 8.9 mg/dL (ref 8.9–10.3)
CHLORIDE: 119 mmol/L — AB (ref 101–111)
CO2: 22 mmol/L (ref 22–32)
Creatinine, Ser: 0.53 mg/dL (ref 0.44–1.00)
GFR calc Af Amer: >60 mL/min (ref >60)
GFR calc non Af Amer: >60 mL/min (ref >60)
Glucose, Bld: 129 mg/dL — ABNORMAL HIGH (ref 65–99)
Potassium: 3.7 mmol/L (ref 3.5–5.1)
Sodium: 149 mmol/L — ABNORMAL HIGH (ref 135–145)

## 2015-02-08 LAB — CBC
HEMATOCRIT: 29.4 % — AB (ref 36.0–46.0)
HEMOGLOBIN: 9.5 g/dL — AB (ref 12.0–15.0)
MCH: 31.1 pg (ref 26.0–34.0)
MCHC: 32.3 g/dL (ref 30.0–36.0)
MCV: 96.4 fL (ref 78.0–100.0)
Platelets: 289 10*3/uL (ref 150–400)
RBC: 3.05 MIL/uL — ABNORMAL LOW (ref 3.87–5.11)
RDW: 16 % — ABNORMAL HIGH (ref 11.5–15.5)
WBC: 15.1 10*3/uL — ABNORMAL HIGH (ref 4.0–10.5)

## 2015-02-08 MED ORDER — AMLODIPINE BESYLATE 10 MG PO TABS
10.0000 mg | ORAL_TABLET | Freq: Every day | ORAL | Status: DC
Start: 2015-02-08 — End: 2015-02-14
  Administered 2015-02-08 – 2015-02-14 (×7): 10 mg via ORAL
  Filled 2015-02-08 (×7): qty 1

## 2015-02-08 MED ORDER — METOPROLOL TARTRATE 50 MG PO TABS
100.0000 mg | ORAL_TABLET | Freq: Two times a day (BID) | ORAL | Status: DC
  Administered 2015-02-08 – 2015-02-10 (×5): 100 mg via ORAL
  Filled 2015-02-08 (×6): qty 2

## 2015-02-08 NOTE — Progress Notes (Signed)
Patient Demographics:    Rachel Vang, is a 50 y.o. female, DOB - 1965/08/14, QMV:[id number]  Admit date - 01/13/2015   Admitting Physician Alyson Reedy, MD  Outpatient Primary MD for the patient is No primary care provider on file.  LOS - 26   Chief Complaint  Patient presents with  . Altered Mental Status     Summary  50yr old AA female with H/O HTN who was noncompliant with her medications, one pack-a-day smoker, daily beer drinker, who was shopping at [employer] and fell down, when she was brought to the ER she was found to have right-sided pupil dilation, in the ER workup was consistent with intracranial bleed with suspected aneurysm, she was intubated, she was noted to the ICU, she was seen by trauma and neurosurgery. 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 She was subsequently treated, PEG tube was placed. During her hospital stay she developed C. difficile colitis, influenza infection and Escherichia coli UTI.  She remains unresponsive and only moves her extremities to painful stimuli, she was transferred out of ICU under hospitalist service on 02/08/2015 on day 26 of her hospital stay.    Subjective:    Rachel Vang today remains unresponsive in bed, in no discomfort.   Assessment  & Plan :     1. Subarachnoid bleed. Status post ACA aneurysm coiling and IVC drain placement (now out) . She remains minimally responsive and moves only to painful stimuli, she has trach and PEG. She is currently on pneumonia pain which will be stopped since 21 days are over, Decadron 1 mg IV twice a day which will be stopped, continue Keppra along with statin. I discussed this with neurosurgeon on call Dr. Conchita Paris on 02/08/2015.  Continue supportive care will require placement.   2. Dysphagia - Unable to maintain Airway , low functional state and extreme deconditioning- due to #1 above. Supportive care. Has tracheostomy with trach collar, PEG tube for tube feeds.   3. C. difficile colitis. Continue vancomycin orally why a PEG tube.   4. Essential hypertension. In poor control. Place on Lopressor and Norvasc along with as needed IV Lopressor and monitor.   5. Irritable bladder. Continue bethanechol.   6.Elevated troponin on admission - Likely demand ischemia (normal echo 01/14/15). On beta blocker and statin, cannot use aspirin due to bleed. Currently not a candidate for further workup.   7. Possible seizure prior to admission. On Keppra continue.    8. Haemophilus influenza tracheobronchitis, Escherichia coli UTI. Has finished treatment for both.   9.  Likely undiagnosed DM2 - check A1c, SSI + Lantus   No results found for: HGBA1C  CBG (last 3)   Recent Labs  02/08/15 0309 02/08/15 0836 02/08/15 1346  GLUCAP 144* 113* 165*      Code Status : DNR  Family Communication  : None   Disposition Plan  : SNF  Consults  :  N.Surg, PCCM, Trauma  Procedures  :   LINES/TUBES: 12/15 ETT >> 12/29 12/15 Rt IJ CVL >>12/26 12/15 IVC >> 1/6 12/18 R PICC >> 12/23 12/23 R IJ TLC>>>plan 1/3  Trach [place]) 12/29 >> PEG (JW) 1/4>> Foley 1/3>> PIV x1  STUDIES: 12/15 CT head >> [facility] 12/16 Echo >> severe LVH, EF 60 to 65%, grade 1 diastolic dysfx  SIGNIFICANT EVENTS: 12/15 Admit, [facility] >> coiling distal ACA aneurysm, IVC drain placed 12/29 Trach (JY) >> 12/30 Fever 01/03 Fever >> lines changed 01/07 C diff positive 01/09 To telemetry -   to hospitalist care on 02/08/2015  DVT Prophylaxis  :   SCDs    Lab Results  Component Value Date   PLT 289 02/08/2015    Inpatient Medications  Scheduled Meds: . antiseptic oral rinse  7 mL Mouth Rinse q12n4p  . bethanechol  10 mg Oral 3 times per day    . chlorhexidine  15 mL Mouth Rinse BID  . dexamethasone  1 mg Intravenous Q12H  . famotidine  20 mg Per Tube BID  . feeding supplement (PRO-STAT SUGAR FREE 64)  30 mL Per Tube BID  . insulin aspart  0-20 Units Subcutaneous 6 times per day  . insulin glargine  10 Units Subcutaneous BID  . levETIRAcetam  500 mg Per Tube BID  . NiMODipine  60 mg Oral Q4H  . simvastatin  20 mg Per Tube q1800  . sodium chloride  10-40 mL Intracatheter Q12H  . vancomycin  500 mg Oral 4 times per day   Continuous Infusions: . sodium chloride 50 mL/hr at 02/07/15 1100  . feeding supplement (JEVITY 1.2 CAL) 1,000 mL (02/06/15 1857)   PRN Meds:.acetaminophen (TYLENOL) oral liquid 160 mg/5 mL, fentaNYL (SUBLIMAZE) injection, metoprolol  Antibiotics  :    Anti-infectives    Start     Dose/Rate Route Frequency Ordered Stop   02/05/15 1200  vancomycin (VANCOCIN) 50 mg/mL oral solution 500 mg     500 mg Oral 4 times per day 02/05/15 1141     02/03/15 1400  cephALEXin (KEFLEX) 250 MG/5ML suspension 500 mg     500 mg Oral 3 times per day 02/03/15 1146 02/07/15 2351   02/02/15 0200  vancomycin (VANCOCIN) IVPB 750 mg/150 ml premix  Status:  Discontinued     750 mg 150 mL/hr over 60 Minutes Intravenous Every 12 hours 02/01/15 1301 02/03/15 1147   02/01/15 1400  cefTAZidime (FORTAZ) 2 g in dextrose 5 % 50 mL IVPB  Status:  Discontinued     2 g 100 mL/hr over 30 Minutes Intravenous 3 times per day 02/01/15 1258 02/03/15 1147   02/01/15 1300  vancomycin (VANCOCIN) 1,500 mg in sodium chloride 0.9 % 500 mL IVPB     1,500 mg 250 mL/hr over 120 Minutes Intravenous  Once 02/01/15 1258 02/01/15 1518   01/23/15 0800  piperacillin-tazobactam (ZOSYN) IVPB 3.375 g     3.375 g 12.5 mL/hr over 240 Minutes Intravenous Every 8 hours 01/23/15 0745 01/25/15 1236   01/19/15 1130  vancomycin (VANCOCIN) IVPB 750 mg/150 ml premix  Status:  Discontinued     750 mg 150 mL/hr over 60 Minutes Intravenous Every 8 hours 01/19/15 1101  01/22/15 1329   01/19/15 1100  piperacillin-tazobactam (ZOSYN) IVPB 3.375 g  Status:  Discontinued     3.375 g 12.5 mL/hr over 240 Minutes Intravenous Every 8 hours 01/19/15 1058 01/22/15 1330   01/13/15 2300  ceFAZolin (ANCEF) IVPB 1 g/50 mL premix  Status:  Discontinued     1 g 100 mL/hr over 30 Minutes Intravenous Every 8 hours 01/13/15 1445 01/15/15 1530   01/13/15 1500  ceFAZolin (ANCEF) IVPB 2 g/50 mL premix     2 g 100 mL/hr over 30 Minutes Intravenous  Once 01/13/15 1445 01/13/15 1545   01/13/15 1445  ceFAZolin (ANCEF) powder 2 g  Status:  Discontinued     2 g Other  Once 01/13/15 1436 01/13/15 1443   01/13/15 1445  ceFAZolin (ANCEF) powder 1 g  Status:  Discontinued     1 g Other 3 times per day 01/13/15 1436 01/13/15 1445        Objective:   Filed Vitals:   02/08/15 0600 02/08/15 0814 02/08/15 1036 02/08/15 1045  BP: 157/68  172/111   Pulse: 91 110 106 174  Temp: 98.4 F (36.9 C)  99.1 F (37.3 C)   TempSrc: Axillary  Axillary   Resp: 19 21 20    Height:      Weight: 73.2 kg (161 lb 6 oz)     SpO2: 98% 100% 100%     Wt Readings from Last 3 Encounters:  02/08/15 73.2 kg (161 lb 6 oz)     Intake/Output Summary (Last 24 hours) at 02/08/15 1330 Last data filed at 02/08/15 0900  Gross per 24 hour  Intake      0 ml  Output    575 ml  Net   -575 ml     Physical Exam  Awake but unresponsive, moves all 4 extremities to painful stimuli [place].AT,PERRAL Supple Neck, Trach site clean, No JVD, No cervical lymphadenopathy appriciated.  Symmetrical Chest wall movement, Good air movement bilaterally, CTAB RRR,No Gallops,Rubs or new Murmurs, No Parasternal Heave +ve B.Sounds, Abd Soft, No tenderness, No organomegaly appriciated, No rebound - guarding or rigidity. PEG site clean, Foley in place No Cyanosis, Clubbing or edema, No new Rash or bruise       Data Review:   Micro Results Recent Results (from the past 240 hour(s))  Culture, Urine     Status: None    Collection Time: 02/01/15 10:49 AM  Result Value Ref Range Status   Specimen Description URINE, CATHETERIZED  Final   Special Requests NONE  Final   Culture >=100,000 COLONIES/mL ESCHERICHIA COLI  Final   Report Status 02/03/2015 FINAL  Final   Organism ID, Bacteria ESCHERICHIA COLI  Final      Susceptibility   Escherichia coli - MIC*    AMPICILLIN 4 SENSITIVE Sensitive     CEFAZOLIN <=4 SENSITIVE Sensitive     CEFTRIAXONE <=1 SENSITIVE Sensitive     CIPROFLOXACIN <=0.25 SENSITIVE Sensitive     GENTAMICIN <=1 SENSITIVE Sensitive     IMIPENEM <=0.25 SENSITIVE Sensitive     NITROFURANTOIN <=16 SENSITIVE Sensitive     TRIMETH/SULFA <=20 SENSITIVE Sensitive     AMPICILLIN/SULBACTAM 4 SENSITIVE Sensitive     PIP/TAZO <=4 SENSITIVE Sensitive     * >=100,000 COLONIES/mL ESCHERICHIA COLI  Culture, respiratory (NON-Expectorated)     Status: None   Collection Time: 02/01/15 11:15 AM  Result Value Ref Range Status   Specimen Description TRACHEAL ASPIRATE  Final   Special Requests Normal  Final   Gram Stain   Final    ABUNDANT WBC PRESENT,BOTH PMN AND MONONUCLEAR FEW SQUAMOUS EPITHELIAL CELLS PRESENT NO ORGANISMS SEEN Performed at [facility]    Culture   Final    NORMAL OROPHARYNGEAL FLORA Performed at [facility]    Report Status 02/03/2015 FINAL  Final  Culture, blood (Routine X 2) w Reflex to ID Panel     Status: None   Collection Time: 02/01/15 11:54 AM  Result Value Ref Range Status   Specimen Description BLOOD RIGHT HAND  Final  Special Requests BOTTLES DRAWN AEROBIC AND ANAEROBIC 5CCS  Final   Culture NO GROWTH 5 DAYS  Final   Report Status 02/06/2015 FINAL  Final  Culture, blood (Routine X 2) w Reflex to ID Panel     Status: None   Collection Time: 02/01/15 11:57 AM  Result Value Ref Range Status   Specimen Description BLOOD LEFT HAND  Final   Special Requests BOTTLES DRAWN AEROBIC ONLY 5CCS  Final   Culture NO GROWTH 5 DAYS  Final   Report Status  02/06/2015 FINAL  Final  C difficile quick scan w PCR reflex     Status: Abnormal   Collection Time: 02/05/15  9:45 AM  Result Value Ref Range Status   C Diff antigen POSITIVE (A) NEGATIVE Final   C Diff toxin POSITIVE (A) NEGATIVE Final   C Diff interpretation Positive for toxigenic C. difficile  Final    Comment: CRITICAL RESULT CALLED TO, READ BACK BY AND VERIFIED WITH: SPOKE TO RN A.SMITH @ 8:18 ON 02/06/2015 K.PEELE   Culture, blood (Routine X 2) w Reflex to ID Panel     Status: None (Preliminary result)   Collection Time: 02/05/15 11:25 AM  Result Value Ref Range Status   Specimen Description BLOOD LEFT HAND  Final   Special Requests IN PEDIATRIC BOTTLE 5CC  Final   Culture NO GROWTH 3 DAYS  Final   Report Status PENDING  Incomplete  Culture, blood (Routine X 2) w Reflex to ID Panel     Status: None (Preliminary result)   Collection Time: 02/05/15 11:30 AM  Result Value Ref Range Status   Specimen Description BLOOD LEFT ANTECUBITAL  Final   Special Requests BOTTLES DRAWN AEROBIC AND ANAEROBIC 5CC  Final   Culture NO GROWTH 3 DAYS  Final   Report Status PENDING  Incomplete  Culture, Urine     Status: None   Collection Time: 02/05/15 11:43 AM  Result Value Ref Range Status   Specimen Description URINE, CATHETERIZED  Final   Special Requests NONE  Final   Culture NO GROWTH 1 DAY  Final   Report Status 02/06/2015 FINAL  Final    Radiology Reports Ct Head Wo Contrast  02/04/2015  CLINICAL DATA:  Followup scan.  EVD clamping for 48 hours. EXAM: CT HEAD WITHOUT CONTRAST TECHNIQUE: Contiguous axial images were obtained from the base of the skull through the vertex without intravenous contrast. COMPARISON:  02/02/2015 FINDINGS: Ventriculostomy catheter position is stable, tip near the foramina [place]. Persistent frothy secretions and mucosal thickening in the sphenoid sinuses in this patient with previous nasal intubation. No new calvarial or orbital findings. 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 There is mild lateral ventriculomegaly, best seen at the temporal horns, which is stable. No third or fourth ventricular enlargement. Scattered subarachnoid hemorrhage which is much improved compared to admission and stable from most recent scan. An ovoid hematoma along the anterior interhemispheric fissure is stable in size and density is fading. Stable low-density in the bilateral inferior frontal lobes. No evidence of acute infarct. IMPRESSION: 1. Stable mild lateral ventriculomegaly. 2. Stable subarachnoid hemorrhage compared to 2 days ago. 3. Sphenoid sinusitis Electronically Signed   By: Marnee Spring M.D.   On: 02/04/2015 15:47   Ct Head Wo Contrast  02/02/2015  CLINICAL DATA:  Cerebral edema.  Aneurysm coiling. EXAM: CT HEAD WITHOUT CONTRAST TECHNIQUE: Contiguous axial images were obtained from  the base of the skull through the vertex without intravenous contrast. 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 Coil remains in good position. Mild diffuse high-density subarachnoid hemorrhage is unchanged. No new hemorrhage. Right frontal ventricular catheter tip at the foramina [place] unchanged. Ventricles are slightly larger compared with the prior study. Negative for acute infarct.  No shift of the midline structures. Air-fluid level in the sphenoid sinus bilaterally similar to the prior study. 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 No new hemorrhage. Ventricular catheter remains in good position. Mild ventricular dilatation compared with the recent CT. Electronically Signed   By: Marlan Palau M.D.   On: 02/02/2015 12:29   Ct Head Wo Contrast  01/28/2015  CLINICAL DATA:  Followup acute subarachnoid  hemorrhage and coiling of anterior cerebral artery aneurysm EXAM: CT HEAD WITHOUT CONTRAST TECHNIQUE: Contiguous axial images were obtained from the base of the skull through the vertex without intravenous contrast. COMPARISON:  01/13/2015 FINDINGS: Ventriculostomy from a right frontal approach enters the frontal horn of the right lateral ventricle in has its tip in the region of the foramen of [place]. Ventricles are mildly larger than were seen on the presenting scan. Coils are present at the location of the anterior communicating artery aneurysm. Anterior interhemispheric blood collide is becoming smaller and less dense. Diffuse subarachnoid hemorrhage is becoming less dense. No definable hyper acute hemorrhage. No convexity subdural or effusion. Layering fluid is present in the sphenoid sinus. No CT evidence of ischemic infarction. IMPRESSION: Previous coiling of anterior communicating artery aneurysm. Right frontal ventriculostomy. Ventricular size is slightly larger than on the presenting examination. Diminishing density of diffuse subarachnoid blood and and interhemispheric hematoma anteriorly. Electronically Signed   By: Paulina Fusi M.D.   On: 01/28/2015 07:30   Ct Head Wo Contrast  01/13/2015  CLINICAL DATA:  Found down outside by neighbors after going to the grocery store, elevated blood pressure at presentation EXAM: CT HEAD WITHOUT CONTRAST CT CERVICAL SPINE WITHOUT CONTRAST TECHNIQUE: Multidetector CT imaging of the head and cervical spine was performed following the standard protocol without intravenous contrast. Multiplanar CT image reconstructions of the cervical spine were also generated. COMPARISON:  None FINDINGS: CT HEAD FINDINGS Normal ventricular morphology. Extensive high attenuation subarachnoid hemorrhage identified throughout sulci, sylvian fissures, basilar cisterns, and interhemispheric fissure particularly anteriorly. Findings are highly suspicious for ruptured intracranial aneurysm.  No intraparenchymal hemorrhage, mass lesion or evidence acute infarction identified. Suspect minimal blood within fourth ventricle. Visualized paranasal sinuses and mastoid air cells clear. Calvarial thickening superolateral LEFT orbit, of uncertain chronicity and etiology. No other osseous abnormalities. CT CERVICAL SPINE FINDINGS Endotracheal tube present anterior to cervical spine. Prevertebral soft tissues normal thickness. Vertebral body heights maintained. Minimal disc space narrowing and endplate spur formation C5-C6. No fracture, subluxation or bone destruction. Visualized skullbase intact. Interseptal thickening at lung apices. Subarachnoid hemorrhage extends into the upper cervical spinal canal. IMPRESSION: Extensive high attenuation acute subarachnoid hemorrhage throughout basilar cisterns, sylvian fissures, interhemispheric fissure, and BILATERAL cortical sulci high suspicious for ruptured intracranial aneurysm. No acute intraparenchymal brain abnormalities. Minimal degenerative disc disease changes cervical spine. No acute cervical spine abnormalities. Critical Value/emergent results were called by telephone at the time of interpretation on 01/13/2015 at 1131 hr to Dr. Frederick Peers , who verbally acknowledged these results. Electronically Signed   By: Ulyses Southward M.D.   On: 01/13/2015 11:50   Ct Cervical Spine Wo Contrast  01/13/2015  CLINICAL DATA:  Found down outside by neighbors after going to the grocery store, elevated blood pressure at presentation EXAM: CT HEAD WITHOUT CONTRAST CT CERVICAL SPINE WITHOUT CONTRAST TECHNIQUE: Multidetector CT imaging of the head and cervical spine was performed following the standard protocol without intravenous contrast. Multiplanar CT image reconstructions of the cervical spine were also generated. COMPARISON:  None FINDINGS: CT HEAD FINDINGS Normal ventricular morphology. Extensive high attenuation subarachnoid hemorrhage identified throughout sulci, sylvian  fissures, basilar cisterns, and interhemispheric fissure particularly anteriorly. Findings are highly suspicious for ruptured intracranial aneurysm. No intraparenchymal hemorrhage, mass lesion or evidence acute infarction identified. Suspect minimal blood within fourth ventricle. Visualized paranasal sinuses and mastoid air cells clear. Calvarial thickening superolateral LEFT orbit, of uncertain chronicity and etiology. No other osseous abnormalities. CT CERVICAL SPINE FINDINGS Endotracheal tube present anterior to cervical spine. Prevertebral soft tissues normal thickness. Vertebral body heights maintained. Minimal disc space narrowing and endplate spur formation C5-C6. No fracture, subluxation or bone destruction. Visualized skullbase intact. Interseptal thickening at lung apices. Subarachnoid hemorrhage extends into the upper cervical spinal canal. IMPRESSION: Extensive high attenuation acute subarachnoid hemorrhage throughout basilar cisterns, sylvian fissures, interhemispheric fissure, and BILATERAL cortical sulci high suspicious for ruptured intracranial aneurysm. No acute intraparenchymal brain abnormalities. Minimal degenerative disc disease changes cervical spine. No acute cervical spine abnormalities. Critical Value/emergent results were called by telephone at the time of interpretation on 01/13/2015 at 1131 hr to Dr. Frederick Peers , who verbally acknowledged these results. Electronically Signed   By: Ulyses Southward M.D.   On: 01/13/2015 11:50   Dg Chest Port 1 View  02/06/2015  CLINICAL DATA:  Patient with history of fever. EXAM: PORTABLE CHEST 1 VIEW COMPARISON:  Chest radiograph 02/05/2015. FINDINGS: Tracheostomy tube terminates the mid trachea. Multiple monitoring leads overlie the patient. Stable enlarged cardiac and mediastinal contours. Elevation of the right hemidiaphragm. Unchanged heterogeneous opacities left lung base. No pleural effusion or pneumothorax. IMPRESSION: Unchanged retrocardiac  opacity which may represent atelectasis or infection. Electronically Signed   By: Annia Belt M.D.   On: 02/06/2015 08:51   Dg Chest Port 1 View  02/05/2015  CLINICAL DATA:  50 year old female with fever EXAM: PORTABLE CHEST 1 VIEW COMPARISON:  Prior chest x-ray obtained earlier today at 6 a.m. FINDINGS: Tracheostomy tube remains in stable position. Unchanged cardiomegaly. Unchanged bilateral retrocardiac airspace opacities with a linear configuration slightly more prominent on the left than the right remains unchanged. No acute osseous abnormality. IMPRESSION: No interval change in the appearance of the chest compared to earlier this morning. Persistent left greater than right basilar atelectasis. Electronically Signed   By: Malachy Moan M.D.   On: 02/05/2015 08:55   Dg Chest Port 1 View  02/05/2015  CLINICAL DATA:  Respiratory failure EXAM: PORTABLE CHEST 1 VIEW COMPARISON:  02/01/2015 chest radiograph. FINDINGS: Tracheostomy tube tip overlies the tracheal air column 3.5 cm above the carina. Stable cardiomediastinal silhouette with mild cardiomegaly. No pneumothorax. No pleural effusion. Stable mild platelike atelectasis at the left lung base. No pulmonary edema. IMPRESSION: Well-positioned tracheostomy tube. Stable mild cardiomegaly without overt pulmonary edema. Stable mild platelike atelectasis at the left lung base. Electronically Signed   By: Delbert Phenix M.D.   On: 02/05/2015 07:42   Dg Chest Port 1 View  02/01/2015  CLINICAL DATA:  Respiratory failure. EXAM: PORTABLE CHEST 1 VIEW COMPARISON:  January 28, 2015. FINDINGS: Stable cardiomediastinal silhouette. No pneumothorax or pleural effusion is noted. Tracheostomy tube is unchanged in position. Distal tip of feeding tube  is seen in expected position of distal stomach. Stable position of right internal jugular catheter line with distal tip in expected position of upper right atrium. Both lungs are clear. The visualized skeletal structures are  unremarkable. IMPRESSION: Stable support apparatus. No acute cardiopulmonary abnormality seen. Electronically Signed   By: Lupita RaiderJames  Green Jr, M.D.   On: 02/01/2015 12:21   Dg Chest Port 1 View  01/28/2015  CLINICAL DATA:  Respiratory failure. EXAM: PORTABLE CHEST 1 VIEW COMPARISON:  01/27/2015 FINDINGS: Again noted is a tracheostomy tube. Jugular central line tip is in the upper right atrium. Feeding tube extends into the abdomen. There is mild peribronchial thickening with increased densities at the lung bases. Slightly low lung volumes. Heart size is stable. IMPRESSION: Low lung volumes with mild peribronchial thickening. Cannot exclude mild interstitial edema. Support apparatuses as described. Electronically Signed   By: Richarda OverlieAdam  Henn M.D.   On: 01/28/2015 08:04   Dg Chest Port 1 View  01/27/2015  CLINICAL DATA:  New tracheostomy EXAM: PORTABLE CHEST 1 VIEW COMPARISON:  Chest radiograph from one day prior. FINDINGS: Tracheostomy tube tip overlies the tracheal air column just below the thoracic inlet. Enteric tube enters the stomach with the tip not seen on this image. Right internal jugular central venous catheter terminates at the cavoatrial junction. Stable cardiomediastinal silhouette with normal heart size. No pneumothorax. No pleural effusion. Stable mild left basilar atelectasis. No pulmonary edema. No new lung opacity. IMPRESSION: 1. Well-positioned tracheostomy tube. 2. Mild left basilar atelectasis. Otherwise no active cardiopulmonary disease. Electronically Signed   By: Delbert PhenixJason A Poff M.D.   On: 01/27/2015 11:33   Dg Chest Port 1 View  01/26/2015  CLINICAL DATA:  Respiratory failure. EXAM: PORTABLE CHEST 1 VIEW COMPARISON:  01/24/2015 FINDINGS: Endotracheal tube is 1.9 cm above the carina. Central line tip in the upper right atrium. Small nodular density in the right upper chest probably related to the ECG lead. Streaky densities in the left lower lung are suggestive for atelectasis. Heart size is  normal. Negative for a pneumothorax. Nasogastric tube extends into the abdomen. IMPRESSION: Few densities at the left lung base are suggestive for atelectasis. Otherwise, no focal airspace disease. Support apparatuses as described. Subtle nodular density in the right upper chest is probably related to the overlying support apparatus as described. Recommend attention to this area on follow up imaging. Electronically Signed   By: Richarda OverlieAdam  Henn M.D.   On: 01/26/2015 07:42   Dg Chest Port 1 View  01/24/2015  CLINICAL DATA:  Endotracheal tube placement. EXAM: PORTABLE CHEST 1 VIEW COMPARISON:  01/23/2015 and 01/22/2015. FINDINGS: 0555 hours. The endotracheal tube has been advanced and is now 1 cm above the carina. Right IJ central venous catheter projects to the mid right atrial level. Nasogastric tube projects below the diaphragm, tip not visualized. The heart size and mediastinal contours are stable. There is stable mild atelectasis at both lung bases. No pneumothorax or significant pleural effusion identified. Multiple lines overlie the chest. IMPRESSION: The endotracheal tube is low with its tip within 1 cm of carina. For more optimal positioning, this could be withdrawn 3-4 cm. No other significant changes. Electronically Signed   By: Carey BullocksWilliam  Veazey M.D.   On: 01/24/2015 08:07   Dg Chest Port 1 View  01/23/2015  CLINICAL DATA:  Evaluate ET tube placement EXAM: PORTABLE CHEST 1 VIEW COMPARISON:  01/22/2015 FINDINGS: The endotracheal tube tip is above the carina. There is a right IJ catheter with tip in the right atrium.  Nasogastric tube tip is below the field of view. Normal heart size. Mild pulmonary edema is identified, increased from previous exam. IMPRESSION: 1. Stable support apparatus. 2. Mild pulmonary edema. Electronically Signed   By: Signa Kell M.D.   On: 01/23/2015 09:55   Dg Chest Port 1 View  01/22/2015  CLINICAL DATA:  Central line placement.  Initial encounter. EXAM: PORTABLE CHEST 1 VIEW  COMPARISON:  Chest radiograph performed 01/21/2015 FINDINGS: The patient's new right IJ line is noted extending into the right atrium. This could be retracted approximately 5 cm, as deemed clinically appropriate. The endotracheal tube is seen ending 3-4 cm above the carina. A right PICC is noted ending about the proximal SVC. An enteric tube is noted extending below the diaphragm. The lungs are hypoexpanded. Mild left basilar atelectasis is noted. No pleural effusion or pneumothorax is seen. The cardiomediastinal silhouette is mildly enlarged. No acute osseous abnormalities are identified. IMPRESSION: 1. Right IJ line noted extending into the right atrium. This could be retracted approximately 5 cm to the cavoatrial junction, as deemed clinically appropriate. 2. Lungs hypoexpanded, with mild left basilar atelectasis. 3. Mild cardiomegaly. These results were called by telephone at the time of interpretation on 01/22/2015 at 2:19 am to [facility] on [facility], who verbally acknowledged these results. Electronically Signed   By: Roanna Raider M.D.   On: 01/22/2015 02:19   Dg Chest Port 1 View  01/21/2015  CLINICAL DATA:  Evaluate ET tube placement EXAM: PORTABLE CHEST 1 VIEW COMPARISON:  01/20/2015 FINDINGS: Right arm PICC line tip is in the cavoatrial junction. ET tube tip is above the carina. There is a nasogastric tube with tip in the stomach. Moderate cardiac enlargement. There is no pleural effusion or edema. No airspace consolidation. IMPRESSION: 1. ET tube tip in satisfactory position above the carina. 2. Stable cardiac enlargement. Electronically Signed   By: Signa Kell M.D.   On: 01/21/2015 07:57   Dg Chest Port 1 View  01/20/2015  CLINICAL DATA:  Intubated. EXAM: PORTABLE CHEST 1 VIEW COMPARISON:  01/19/2015 FINDINGS: The endotracheal tube lies 1 cm above the carina. Right PICC terminates over the cavoatrial junction. Cardiac silhouette remains mildly enlarged. Enteric tube courses into the left  upper abdomen with tip not imaged. There is improved aeration of the right lung base. Retrocardiac opacity in the left lung base is similar to the prior study. No sizable pleural effusion or pneumothorax is identified. IMPRESSION: 1. Endotracheal tube 1 cm above the carina. Suggest retracting slightly. 2. Improved aeration of the right lung base. Unchanged left basilar atelectasis. Electronically Signed   By: Sebastian Ache M.D.   On: 01/20/2015 07:51   Dg Chest Port 1 View  01/19/2015  CLINICAL DATA:  Acute respiratory failure, subarachnoid hemorrhage. EXAM: PORTABLE CHEST 1 VIEW COMPARISON:  Portable chest x-ray of January 17, 2015 FINDINGS: The lungs are adequately inflated. There is subsegmental atelectasis in the retrocardiac region medially on the left and in the right infrahilar region. There is no significant pleural effusion. There is no pneumothorax. The cardiac silhouette is mildly enlarged. The pulmonary vascularity is not engorged. The endotracheal tube tip lies 2.6 cm above the carina. The esophagogastric tube tip projects below the inferior margin of the image. The PICC line tip projects over the midportion of the SVC. IMPRESSION: Mild bibasilar subsegmental atelectasis or infiltrate. Stable cardiomegaly without pulmonary edema. The support tubes are in reasonable position. Electronically Signed   By: David  [place] M.D.   On: 01/19/2015 07:38  Dg Chest Port 1 View  01/17/2015  CLINICAL DATA:  Acute respiratory failure, acute intracranial hemorrhage. EXAM: PORTABLE CHEST 1 VIEW COMPARISON:  Portable chest x-ray of January 16, 2015 FINDINGS: There remains mild hypo inflation predominantly on the right. The pulmonary interstitial markings are minimally prominent though stable. There is no alveolar pneumonia. The endotracheal tube tip is 1.6 cm above the carina. The cardiac silhouette remains enlarged. The pulmonary vascularity is not engorged. The esophagogastric tube tip projects below the  inferior margin of the image. The observed bony thorax is unremarkable. IMPRESSION: 1. The endotracheal tube tip lies approximately 1.6 cm above the carina. Withdrawal by 2 cm is recommended. 2. Mild hypo inflation especially on the right. Minimal stable interstitial prominence bilaterally. There is no alveolar pneumonia. Electronically Signed   By: David  [place] M.D.   On: 01/17/2015 08:27   Dg Chest Port 1 View  01/16/2015  CLINICAL DATA:  50 year old female with respiratory failure. EXAM: PORTABLE CHEST 1 VIEW COMPARISON:  01/14/2015 FINDINGS: Cardiomegaly, endotracheal tube with tip 1.8 cm above the carina, left IJ central venous catheter with tip overlying the upper SVC, and NG tube entering the stomach with tip off the field of view again noted. Pulmonary vascular congestion has decreased. Slightly low lung volumes noted with bibasilar atelectasis. There is no evidence of pneumothorax. IMPRESSION: Decreased pulmonary vascular congestion without other significant change. Electronically Signed   By: Harmon Pier M.D.   On: 01/16/2015 08:46   Dg Chest Port 1 View  01/14/2015  CLINICAL DATA:  Respiratory failure. EXAM: PORTABLE CHEST 1 VIEW COMPARISON:  01/13/2015. FINDINGS: Endotracheal tube tip is just above the orifice of the right mainstem bronchus. Proximal retraction of approximately 2 3 cm suggested. Left IJ line and NG tube in stable position. Stable cardiomegaly. Mild bilateral interstitial prominence. Mild congestive heart failure cannot be excluded. Low lung volumes. No pneumothorax. IMPRESSION: 1. Endotracheal tube tip is at the orifice of the right mainstem bronchus. Proximal repositioning of 2 to 3 cm suggested. Remaining lines and tubes in stable position. 2. Cardiomegaly with mild bilateral from interstitial prominence. A mild component of congestive heart failure cannot be excluded. 3. Low lung volumes . Critical Value/emergent results were called by telephone at the time of interpretation  on 01/14/2015 at 7:45 am to nurse Suzanna, who verbally acknowledged these results. Electronically Signed   By: Maisie Fus  Register   On: 01/14/2015 07:47   Dg Chest Portable 1 View  01/13/2015  CLINICAL DATA:  Central catheter placement EXAM: PORTABLE CHEST 1 VIEW COMPARISON:  Study obtained earlier in the day FINDINGS: Central catheter tip is in the superior vena cava. Nasogastric tube tip and side port are below the diaphragm. Endotracheal tube tip is 2.3 cm above the carina. No pneumothorax. There is mild bibasilar atelectasis. Lungs elsewhere clear. Heart is mildly enlarged with pulmonary vascularity within normal limits. No adenopathy. IMPRESSION: Tube and catheter positions as described without pneumothorax. Mild bibasilar atelectasis. Lungs elsewhere clear. No change in cardiac silhouette. Electronically Signed   By: Bretta Bang III M.D.   On: 01/13/2015 13:36   Dg Chest Portable 1 View  01/13/2015  CLINICAL DATA:  Check endotracheal tube placement EXAM: PORTABLE CHEST - 1 VIEW COMPARISON:  08/30/2005 FINDINGS: Cardiac shadow remains enlarged. An endotracheal tube is noted 4 cm above the carina. The lungs are well aerated bilaterally with mild peribronchial cuffing bilaterally which may represent bronchitis. No focal confluent infiltrate is seen. IMPRESSION: Endotracheal tube in satisfactory position. Mild central peribronchial  cuffing Electronically Signed   By: Alcide Clever M.D.   On: 01/13/2015 12:02   Dg Abd Portable 1v  01/27/2015  CLINICAL DATA:  Patient status post feeding tube placement. EXAM: PORTABLE ABDOMEN - 1 VIEW COMPARISON:  Abdominal radiograph 01/13/2015. FINDINGS: Feeding tube is present with tip projecting at the gastric antrum. Gas is demonstrated throughout the colon. No definite evidence for free intraperitoneal air. Cardiomegaly. Focal consolidative opacity within the left lung base. IMPRESSION: Feeding tube tip projects at the gastric antrum. Focal nodular consolidative  opacity within the left lung base potentially secondary to an infectious process or atelectasis. Continued radiographic follow-up is recommended to ensure resolution. Electronically Signed   By: Annia Belt M.D.   On: 01/27/2015 11:39   Dg Abd Portable 1v  01/13/2015  CLINICAL DATA:  OG tube placement. EXAM: PORTABLE ABDOMEN - 1 VIEW COMPARISON:  08/30/2005 FINDINGS: Enteric tube is present with tip in the right mid abdomen consistent with location in the second-third portion of the duodenum. Evaluation of the abdomen is limited due to motion artifact but there is no gross evidence of any gaseous distention of bowel. IMPRESSION: Enteric tube tip is in the right mid abdomen consistent with location in the second-third portion of the duodenum. Electronically Signed   By: Burman Nieves M.D.   On: 01/13/2015 22:48     CBC  Recent Labs Lab 02/03/15 0249 02/04/15 0542 02/05/15 0233 02/06/15 0442 02/08/15 0508  WBC 18.5* 11.3* 15.4* 27.0* 15.1*  HGB 11.7* 10.5* 10.9* 11.1* 9.5*  HCT 35.2* 32.4* 34.0* 32.3* 29.4*  PLT 311 337 283 367 289  MCV 95.9 96.1 95.8 95.8 96.4  MCH 31.9 31.2 30.7 32.9 31.1  MCHC 33.2 32.4 32.1 34.4 32.3  RDW 15.4 15.7* 15.5 16.6* 16.0*  LYMPHSABS 1.9 2.3 1.7 1.4  --   MONOABS 0.9 0.7 1.0 1.4*  --   EOSABS 0.0 0.2 0.0 0.0  --   BASOSABS 0.0 0.0 0.0 0.0  --     Chemistries   Recent Labs Lab 02/02/15 0255 02/03/15 0249 02/04/15 0542 02/05/15 0233 02/06/15 0442 02/08/15 0508  NA 141 138 142 139 142 149*  K 4.1 4.1 3.7 4.1 3.8 3.7  CL 104 106 111 106 115* 119*  CO2 27 21* 22 23 18* 22  GLUCOSE 78 154* 143* 139* 182* 129*  BUN 19 25* 21* 17 18 20   CREATININE 0.50 0.50 0.55 0.54 0.54 0.53  CALCIUM 9.7 9.3 9.2 9.3 9.3 8.9  MG 2.0 1.9 1.9 1.8 1.9  --    ------------------------------------------------------------------------------------------------------------------ estimated creatinine clearance is 83.4 mL/min (by C-G formula based on Cr of  0.53). ------------------------------------------------------------------------------------------------------------------ No results for input(s): HGBA1C in the last 72 hours. ------------------------------------------------------------------------------------------------------------------ No results for input(s): CHOL, HDL, LDLCALC, TRIG, CHOLHDL, LDLDIRECT in the last 72 hours. ------------------------------------------------------------------------------------------------------------------ No results for input(s): TSH, T4TOTAL, T3FREE, THYROIDAB in the last 72 hours.  Invalid input(s): FREET3 ------------------------------------------------------------------------------------------------------------------ No results for input(s): VITAMINB12, FOLATE, FERRITIN, TIBC, IRON, RETICCTPCT in the last 72 hours.  Coagulation profile No results for input(s): INR, PROTIME in the last 168 hours.  No results for input(s): DDIMER in the last 72 hours.  Cardiac Enzymes  Recent Labs Lab 02/04/15 0542  CKMB 5.4*   ------------------------------------------------------------------------------------------------------------------ Invalid input(s): POCBNP   Time Spent in minutes   45   Melea Prezioso K M.D on 02/08/2015 at 1:30 PM  Between 7am to 7pm - Pager - [phone]  After 7pm go to [email or web address] - password [email or web address]  [facility] -  Office  [phone]      

## 2015-02-08 NOTE — Progress Notes (Addendum)
No issues overnight.  EXAM:  BP 138/85 mmHg  Pulse 107  Temp(Src) 99.1 F (37.3 C) (Axillary)  Resp 18  Ht 5\' 5"  (1.651 m)  Wt 73.2 kg (161 lb 6 oz)  BMI 26.85 kg/m2  SpO2 100%  LMP  (LMP Unknown)  Awake, alert Non-responsive, not following commands Moves left, some w/d on right  IMPRESSION:  50 y.o. female s/p SAH, coiling of distal ACA aneurysm C diff colitis No clinical or radiographic evidence of hydrocephalus  PLAN: - Cont abx - Has completed Nimotop course - Can wean decadron to off - Will not need CSF diversion surgery (shunt) - Placement in SNF. Can f/u with me in about 1 month. - [employer] can come out next Monday

## 2015-02-08 NOTE — Progress Notes (Signed)
Nutrition Follow-up   INTERVENTION:  Continue Jevity 1.2 @ 50 ml/hr with 30 ml Prostat  BID to provide 1640 kcal, 97 grams protein, and 972 ml H2O.   Recommend d/c IV fluids and provide 150 ml free water flushes every 4 hours to provide an additional 900 ml free water for a total of 1872 ml of water daily.    NUTRITION DIAGNOSIS:   Inadequate oral intake related to inability to eat as evidenced by NPO status.  Ongoing  GOAL:   Patient will meet greater than or equal to 90% of their needs  Being met  MONITOR:   TF tolerance, Skin, I & O's, Labs, Weight trends  REASON FOR ASSESSMENT:   Consult Enteral/tube feeding initiation and management  ASSESSMENT:   Pt with hx of HTN, non-compliant with medications and daily drinker admitted after being found down with extensive subarachnoid hemorrhage, likely ruptured aneurysm, coil done, drain IVC placed.   Pt continues to receive continuous tube feedings of Jevity 1.2 @ 50 ml/hr via PEG; tolerating. Pt awake but unresponsive. Per nursing notes, she continues to have diarrhea. IV fluids at 50 ml/hr. Pt's weight has fluctuated from 160 lbs to 168 lbs, down to 147 lbs, and back up to 161 lbs. She seems to have some mild muscle wasting of thighs/patellar region. Will continue to monitor weight trends.   Labs: glucose ranging 105 to 182 mg/dL, low hemoglobin, elevated sodium, elevated chloride  Diet Order:  Diet NPO time specified  Skin:  Wound (see comment) (MSAD on buttocks; closed incision on head)  Last BM:  1/9 400 ml via rectal tube  Height:   Ht Readings from Last 1 Encounters:  01/18/15 5' 5"  (1.651 m)    Weight:   Wt Readings from Last 1 Encounters:  02/08/15 161 lb 6 oz (73.2 kg)    Ideal Body Weight:  56.8 kg  BMI:  Body mass index is 26.85 kg/(m^2).  Estimated Nutritional Needs:   Kcal:  1600-1800  Protein:  85-100 grams  Fluid:  > 1.6 L/day  EDUCATION NEEDS:   No education needs identified at this  time  [place], LDN Inpatient Clinical Dietitian Pager: [phone] After Hours Pager: [phone]

## 2015-02-08 NOTE — Progress Notes (Signed)
PULMONARY / CRITICAL CARE MEDICINE   Name: Lucienne MinksStephanie N Seay MRN: [id number] DOB: 02/06/1965    ADMISSION DATE:  01/13/2015  REFERRING MD:  EDP  CHIEF COMPLAINT:  Altered mental status  LINES/TUBES: 12/15 ETT >> 12/29 12/15 Rt IJ CVL >>12/26 12/15 IVC >> 1/6 12/18 R PICC >> 12/23 12/23 R IJ TLC>>>plan 1/3  Trach [place] Lights(JY) 12/29 >> PEG (JW) 1/4>> Foley 1/3>> PIV x1  STUDIES: [facility] Echo >> severe LVH, EF 60 to 65%, grade 1 diastolic dysfx  SIGNIFICANT EVENTS: 12/15 Admit, SAH >> coiling distal ACA aneurysm, IVC drain placed 12/29 Trach (JY) >> 12/30 Fever 01/03 Fever >> lines changed 01/07 C diff positive 01/09 To telemetry  SUBJECTIVE: Patient with altered mentation and unable to provide history. Nurse reports no events overnight. Tolerating tube feeds. Tolerating tracheostomy collar.  REVIEW OF SYSTEMS: Unable to obtain given altered mentation.  VITAL SIGNS: BP 157/68 mmHg  Pulse 110  Temp(Src) 98.4 F (36.9 C) (Axillary)  Resp 21  Ht 5\' 5"  (1.651 m)  Wt 161 lb 6 oz (73.2 kg)  BMI 26.85 kg/m2  SpO2 100%  LMP  (LMP Unknown)  INTAKE / OUTPUT: I/O last 3 completed shifts: In: 1600 [I.V.:800; NG/GT:800] Out: 1775 [Urine:1375; Stool:400]  PHYSICAL EXAMINATION: General:  Awake. No acute distress. Laying in bed with nurse at bedside. Integument:  Warm & dry. No rash on exposed skin.  HEENT:  Tracheostomy in place. No scleral injection. Moist rate. Cardiovascular:  Mild tachycardia but regular rhythm. Normal S1 & S2. No appreciable JVD.  Pulmonary:  Good aeration & clear to auscultation bilaterally. Symmetric chest wall rise on tracheostomy collar. Abdomen: Soft. Normal bowel sounds. PEG tube in place.   CBC Recent Labs     02/06/15  0442  02/08/15  0508  WBC  27.0*  15.1*  HGB  11.1*  9.5*  HCT  32.3*  29.4*  PLT  367  289    BMET Recent Labs     02/06/15  0442  02/08/15  0508  NA  142  149*  K  3.8  3.7  CL  115*  119*  CO2   18*  22  BUN  18  20  CREATININE  0.54  0.53  GLUCOSE  182*  129*    Electrolytes Recent Labs     02/06/15  0442  02/08/15  0508  CALCIUM  9.3  8.9  MG  1.9   --   PHOS  2.6   --     Sepsis Markers Recent Labs     02/05/15  1125  02/06/15  0442  02/07/15  0248  PROCALCITON  0.41  4.79  2.37    Glucose Recent Labs     02/07/15  0828  02/07/15  1758  02/07/15  2028  02/08/15  0025  02/08/15  0309  02/08/15  0836  GLUCAP  133*  112*  125*  165*  144*  113*    Imaging No results found.  ASSESSMENT / PLAN:  50 yo female smoker presented with altered mental status, Rt pupil dilation from HTN emergency (BP 245/194) and SAH. Patient has tolerated transition to tracheostomy collar after percutaneous tracheostomy was placed on 12/29.  1. Compromised Airway:  Secondary to acute encephalopathy. S/P Tracheostomy & PEG placement. Plan for trach change later this week. 2. 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 3. SAH:  S/P Coiling. Care per Neurosurgery.  Remainder of patient's care  per primary service.  Donna Christen Jamison Neighbor, M.D. [facility] Pager:  [phone] After 3pm or if no response, call [phone]  02/08/2015, 9:31 AM

## 2015-02-09 ENCOUNTER — Inpatient Hospital Stay (HOSPITAL_COMMUNITY): Payer: Medicaid Other

## 2015-02-09 LAB — GLUCOSE, CAPILLARY
GLUCOSE-CAPILLARY: 122 mg/dL — AB (ref 65–99)
GLUCOSE-CAPILLARY: 177 mg/dL — AB (ref 65–99)
Glucose-Capillary: 125 mg/dL — ABNORMAL HIGH (ref 65–99)
Glucose-Capillary: 151 mg/dL — ABNORMAL HIGH (ref 65–99)
Glucose-Capillary: 131 mg/dL — ABNORMAL HIGH (ref 65–99)

## 2015-02-09 LAB — HEMOGLOBIN A1C
HEMOGLOBIN A1C: 7.1 % — AB (ref 4.8–5.6)
MEAN PLASMA GLUCOSE: 157 mg/dL

## 2015-02-09 MED ORDER — LISINOPRIL 10 MG PO TABS
10.0000 mg | ORAL_TABLET | Freq: Every day | ORAL | Status: DC
  Administered 2015-02-09 – 2015-02-14 (×6): 10 mg via ORAL
  Filled 2015-02-09 (×6): qty 1

## 2015-02-09 MED ORDER — FENTANYL 25 MCG/HR TD PT72
25.0000 ug | MEDICATED_PATCH | TRANSDERMAL | Status: DC
  Administered 2015-02-09 – 2015-02-12 (×2): 25 ug via TRANSDERMAL
  Filled 2015-02-09 (×2): qty 1

## 2015-02-09 MED ORDER — WHITE PETROLATUM GEL
Status: AC
  Administered 2015-02-09: 0.2
  Filled 2015-02-09: qty 1

## 2015-02-09 NOTE — Progress Notes (Signed)
Patient Demographics:    Rachel Vang, is a 50 y.o. female, DOB - 07/01/1965, NUU:[id number]  Admit date - 01/13/2015   Admitting Physician Alyson Reedy, MD  Outpatient Primary MD for the patient is No primary care provider on file.  LOS - 27   Chief Complaint  Patient presents with  . Altered Mental Status     Summary  50yr old AA female with H/O HTN who was noncompliant with her medications, one pack-a-day smoker, daily beer drinker, who was shopping at [employer] and fell down, when she was brought to the ER she was found to have right-sided pupil dilation, in the ER workup was consistent with intracranial bleed with suspected aneurysm, she was intubated, she was noted to the ICU, she was seen by trauma and neurosurgery. 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 She was subsequently treated, PEG tube was placed. During her hospital stay she developed C. difficile colitis, influenza infection and Escherichia coli UTI.  She remains unresponsive and only moves her extremities to painful stimuli, she was transferred out of ICU under hospitalist service on 02/08/2015 on day 26 of her hospital stay.    Subjective:    Carroll Kinds today remains unresponsive in bed, in no discomfort. Moves all 4 extremities to painful stimuli.   Assessment  & Plan :     1. Subarachnoid bleed. Status post ACA aneurysm coiling and IVC drain placement (now out) . She remains minimally responsive and moves only to painful stimuli, she has trach and PEG. She is currently on pneumonia pain which will be stopped since 21 days are over, Decadron 1 mg IV twice a day which will be stopped, continue Keppra along with statin. I discussed this with  neurosurgeon on call Dr. Conchita Paris on 02/08/2015. Continue supportive care will require placement.   2. Dysphagia - Unable to maintain Airway , low functional state and extreme deconditioning- due to #1 above. Supportive care. Has tracheostomy with trach collar, PEG tube for tube feeds.   3. C. difficile colitis. Continue vancomycin orally why a PEG tube. DC rectal tube on 02/09/2015.   4. Essential hypertension. In poor control. Place on Lopressor and Norvasc along with as needed IV Lopressor and monitor.   5. Irritable bladder. Continue bethanechol.   6.Elevated troponin on admission - Likely demand ischemia (normal echo 01/14/15). On beta blocker and statin, cannot use aspirin due to bleed. Currently not a candidate for further workup.   7. Possible seizure prior to admission. On Keppra continue.    8. Haemophilus influenza tracheobronchitis, Escherichia coli UTI. Has finished treatment for both.   9. Undiagnosed DM2 - SSI + Lantus   Lab Results  Component Value Date   HGBA1C 7.1* 02/08/2015    CBG (last 3)   Recent Labs  02/08/15 2355 02/09/15 0355 02/09/15 0824  GLUCAP 134* 125* 151*      Code Status : DNR  Family Communication  : None   Disposition Plan  : SNF  Consults  :  N.Surg, PCCM, Trauma  Procedures  :   LINES/TUBES: 12/15 ETT >> 12/29 12/15 Rt IJ CVL >>12/26 12/15 IVC >> 1/6 12/18 R PICC >> 12/23 12/23 R  IJ TLC>>>plan 1/3  Trach [place]) 12/29 >> PEG (JW) 1/4>> Foley 1/3>> PIV x1  STUDIES: 12/15 CT head >> [facility] 12/16 Echo >> severe LVH, EF 60 to 65%, grade 1 diastolic dysfx  SIGNIFICANT EVENTS: 12/15 Admit, [facility] >> coiling distal ACA aneurysm, IVC drain placed 12/29 Trach (JY) >> 12/30 Fever 01/03 Fever >> lines changed 01/07 C diff positive 01/09 To telemetry -   to hospitalist care on 02/08/2015  DVT Prophylaxis  :   SCDs    Lab Results  Component Value Date   PLT 289 02/08/2015    Inpatient Medications  Scheduled  Meds: . amLODipine  10 mg Oral Daily  . antiseptic oral rinse  7 mL Mouth Rinse q12n4p  . bethanechol  10 mg Oral 3 times per day  . chlorhexidine  15 mL Mouth Rinse BID  . famotidine  20 mg Per Tube BID  . feeding supplement (PRO-STAT SUGAR FREE 64)  30 mL Per Tube BID  . fentaNYL  25 mcg Transdermal Q72H  . insulin aspart  0-20 Units Subcutaneous 6 times per day  . insulin glargine  10 Units Subcutaneous BID  . levETIRAcetam  500 mg Per Tube BID  . lisinopril  10 mg Oral Daily  . metoprolol tartrate  100 mg Oral BID  . simvastatin  20 mg Per Tube q1800  . vancomycin  500 mg Oral 4 times per day   Continuous Infusions: . feeding supplement (JEVITY 1.2 CAL) 50 mL/hr at 02/09/15 0842   PRN Meds:.acetaminophen (TYLENOL) oral liquid 160 mg/5 mL, metoprolol  Antibiotics  :    Anti-infectives    Start     Dose/Rate Route Frequency Ordered Stop   02/05/15 1200  vancomycin (VANCOCIN) 50 mg/mL oral solution 500 mg     500 mg Oral 4 times per day 02/05/15 1141     02/03/15 1400  cephALEXin (KEFLEX) 250 MG/5ML suspension 500 mg     500 mg Oral 3 times per day 02/03/15 1146 02/07/15 2351   02/02/15 0200  vancomycin (VANCOCIN) IVPB 750 mg/150 ml premix  Status:  Discontinued     750 mg 150 mL/hr over 60 Minutes Intravenous Every 12 hours 02/01/15 1301 02/03/15 1147   02/01/15 1400  cefTAZidime (FORTAZ) 2 g in dextrose 5 % 50 mL IVPB  Status:  Discontinued     2 g 100 mL/hr over 30 Minutes Intravenous 3 times per day 02/01/15 1258 02/03/15 1147   02/01/15 1300  vancomycin (VANCOCIN) 1,500 mg in sodium chloride 0.9 % 500 mL IVPB     1,500 mg 250 mL/hr over 120 Minutes Intravenous  Once 02/01/15 1258 02/01/15 1518   01/23/15 0800  piperacillin-tazobactam (ZOSYN) IVPB 3.375 g     3.375 g 12.5 mL/hr over 240 Minutes Intravenous Every 8 hours 01/23/15 0745 01/25/15 1236   01/19/15 1130  vancomycin (VANCOCIN) IVPB 750 mg/150 ml premix  Status:  Discontinued     750 mg 150 mL/hr over 60  Minutes Intravenous Every 8 hours 01/19/15 1101 01/22/15 1329   01/19/15 1100  piperacillin-tazobactam (ZOSYN) IVPB 3.375 g  Status:  Discontinued     3.375 g 12.5 mL/hr over 240 Minutes Intravenous Every 8 hours 01/19/15 1058 01/22/15 1330   01/13/15 2300  ceFAZolin (ANCEF) IVPB 1 g/50 mL premix  Status:  Discontinued     1 g 100 mL/hr over 30 Minutes Intravenous Every 8 hours 01/13/15 1445 01/15/15 1530   01/13/15 1500  ceFAZolin (ANCEF) IVPB 2 g/50 mL premix  2 g 100 mL/hr over 30 Minutes Intravenous  Once 01/13/15 1445 01/13/15 1545   01/13/15 1445  ceFAZolin (ANCEF) powder 2 g  Status:  Discontinued     2 g Other  Once 01/13/15 1436 01/13/15 1443   01/13/15 1445  ceFAZolin (ANCEF) powder 1 g  Status:  Discontinued     1 g Other 3 times per day 01/13/15 1436 01/13/15 1445        Objective:   Filed Vitals:   02/09/15 0430 02/09/15 0521 02/09/15 0602 02/09/15 0745  BP:   171/96   Pulse:  105 103 105  Temp:   99.1 F (37.3 C)   TempSrc:   Axillary   Resp:  18 18 20   Height:      Weight: 70.1 kg (154 lb 8.7 oz)     SpO2:  100% 100% 100%    Wt Readings from Last 3 Encounters:  02/09/15 70.1 kg (154 lb 8.7 oz)     Intake/Output Summary (Last 24 hours) at 02/09/15 0916 Last data filed at 02/09/15 0603  Gross per 24 hour  Intake    100 ml  Output   1625 ml  Net  -1525 ml     Physical Exam  Awake but unresponsive, moves all 4 extremities to painful stimuli Craig.AT,PERRAL Supple Neck, Trach site clean, No JVD, No cervical lymphadenopathy appriciated.  Symmetrical Chest wall movement, Good air movement bilaterally, CTAB RRR,No Gallops,Rubs or new Murmurs, No Parasternal Heave +ve B.Sounds, Abd Soft, No tenderness, No organomegaly appriciated, No rebound - guarding or rigidity. PEG site clean, Foley in place No Cyanosis, Clubbing or edema, No new Rash or bruise       Data Review:   Micro Results Recent Results (from the past 240 hour(s))  Culture, Urine      Status: None   Collection Time: 02/01/15 10:49 AM  Result Value Ref Range Status   Specimen Description URINE, CATHETERIZED  Final   Special Requests NONE  Final   Culture >=100,000 COLONIES/mL ESCHERICHIA COLI  Final   Report Status 02/03/2015 FINAL  Final   Organism ID, Bacteria ESCHERICHIA COLI  Final      Susceptibility   Escherichia coli - MIC*    AMPICILLIN 4 SENSITIVE Sensitive     CEFAZOLIN <=4 SENSITIVE Sensitive     CEFTRIAXONE <=1 SENSITIVE Sensitive     CIPROFLOXACIN <=0.25 SENSITIVE Sensitive     GENTAMICIN <=1 SENSITIVE Sensitive     IMIPENEM <=0.25 SENSITIVE Sensitive     NITROFURANTOIN <=16 SENSITIVE Sensitive     TRIMETH/SULFA <=20 SENSITIVE Sensitive     AMPICILLIN/SULBACTAM 4 SENSITIVE Sensitive     PIP/TAZO <=4 SENSITIVE Sensitive     * >=100,000 COLONIES/mL ESCHERICHIA COLI  Culture, respiratory (NON-Expectorated)     Status: None   Collection Time: 02/01/15 11:15 AM  Result Value Ref Range Status   Specimen Description TRACHEAL ASPIRATE  Final   Special Requests Normal  Final   Gram Stain   Final    ABUNDANT WBC PRESENT,BOTH PMN AND MONONUCLEAR FEW SQUAMOUS EPITHELIAL CELLS PRESENT NO ORGANISMS SEEN Performed at [facility]    Culture   Final    NORMAL OROPHARYNGEAL FLORA Performed at [facility]    Report Status 02/03/2015 FINAL  Final  Culture, blood (Routine X 2) w Reflex to ID Panel     Status: None   Collection Time: 02/01/15 11:54 AM  Result Value Ref Range Status   Specimen Description BLOOD RIGHT HAND  Final  Special Requests BOTTLES DRAWN AEROBIC AND ANAEROBIC 5CCS  Final   Culture NO GROWTH 5 DAYS  Final   Report Status 02/06/2015 FINAL  Final  Culture, blood (Routine X 2) w Reflex to ID Panel     Status: None   Collection Time: 02/01/15 11:57 AM  Result Value Ref Range Status   Specimen Description BLOOD LEFT HAND  Final   Special Requests BOTTLES DRAWN AEROBIC ONLY 5CCS  Final   Culture NO GROWTH 5 DAYS  Final    Report Status 02/06/2015 FINAL  Final  C difficile quick scan w PCR reflex     Status: Abnormal   Collection Time: 02/05/15  9:45 AM  Result Value Ref Range Status   C Diff antigen POSITIVE (A) NEGATIVE Final   C Diff toxin POSITIVE (A) NEGATIVE Final   C Diff interpretation Positive for toxigenic C. difficile  Final    Comment: CRITICAL RESULT CALLED TO, READ BACK BY AND VERIFIED WITH: SPOKE TO RN A.SMITH @ 8:18 ON 02/06/2015 K.PEELE   Culture, blood (Routine X 2) w Reflex to ID Panel     Status: None (Preliminary result)   Collection Time: 02/05/15 11:25 AM  Result Value Ref Range Status   Specimen Description BLOOD LEFT HAND  Final   Special Requests IN PEDIATRIC BOTTLE 5CC  Final   Culture NO GROWTH 3 DAYS  Final   Report Status PENDING  Incomplete  Culture, blood (Routine X 2) w Reflex to ID Panel     Status: None (Preliminary result)   Collection Time: 02/05/15 11:30 AM  Result Value Ref Range Status   Specimen Description BLOOD LEFT ANTECUBITAL  Final   Special Requests BOTTLES DRAWN AEROBIC AND ANAEROBIC 5CC  Final   Culture NO GROWTH 3 DAYS  Final   Report Status PENDING  Incomplete  Culture, Urine     Status: None   Collection Time: 02/05/15 11:43 AM  Result Value Ref Range Status   Specimen Description URINE, CATHETERIZED  Final   Special Requests NONE  Final   Culture NO GROWTH 1 DAY  Final   Report Status 02/06/2015 FINAL  Final    Radiology Reports Ct Head Wo Contrast  02/04/2015  CLINICAL DATA:  Followup scan.  EVD clamping for 48 hours. EXAM: CT HEAD WITHOUT CONTRAST TECHNIQUE: Contiguous axial images were obtained from the base of the skull through the vertex without intravenous contrast. COMPARISON:  02/02/2015 FINDINGS: Ventriculostomy catheter position is stable, tip near the foramina Cunningham. Persistent frothy secretions and mucosal thickening in the sphenoid sinuses in this patient with previous nasal intubation. No new calvarial or orbital findings.  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 There is mild lateral ventriculomegaly, best seen at the temporal horns, which is stable. No third or fourth ventricular enlargement. Scattered subarachnoid hemorrhage which is much improved compared to admission and stable from most recent scan. An ovoid hematoma along the anterior interhemispheric fissure is stable in size and density is fading. Stable low-density in the bilateral inferior frontal lobes. No evidence of acute infarct. IMPRESSION: 1. Stable mild lateral ventriculomegaly. 2. Stable subarachnoid hemorrhage compared to 2 days ago. 3. Sphenoid sinusitis Electronically Signed   By: Marnee Spring M.D.   On: 02/04/2015 15:47   Ct Head Wo Contrast  02/02/2015  CLINICAL DATA:  Cerebral edema.  Aneurysm coiling. EXAM: CT HEAD WITHOUT CONTRAST TECHNIQUE: Contiguous axial images were obtained from  the base of the skull through the vertex without intravenous contrast. 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 Coil remains in good position. Mild diffuse high-density subarachnoid hemorrhage is unchanged. No new hemorrhage. Right frontal ventricular catheter tip at the foramina Monroe unchanged. Ventricles are slightly larger compared with the prior study. Negative for acute infarct.  No shift of the midline structures. Air-fluid level in the sphenoid sinus bilaterally similar to the prior study. 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 No new hemorrhage. Ventricular catheter remains in good position. Mild ventricular dilatation compared with the recent CT. Electronically Signed   By: Marlan Palau M.D.   On: 02/02/2015 12:29   Ct Head Wo Contrast  01/28/2015  CLINICAL DATA:   Followup acute subarachnoid hemorrhage and coiling of anterior cerebral artery aneurysm EXAM: CT HEAD WITHOUT CONTRAST TECHNIQUE: Contiguous axial images were obtained from the base of the skull through the vertex without intravenous contrast. COMPARISON:  01/13/2015 FINDINGS: Ventriculostomy from a right frontal approach enters the frontal horn of the right lateral ventricle in has its tip in the region of the foramen of Monro. Ventricles are mildly larger than were seen on the presenting scan. Coils are present at the location of the anterior communicating artery aneurysm. Anterior interhemispheric blood collide is becoming smaller and less dense. Diffuse subarachnoid hemorrhage is becoming less dense. No definable hyper acute hemorrhage. No convexity subdural or effusion. Layering fluid is present in the sphenoid sinus. No CT evidence of ischemic infarction. IMPRESSION: Previous coiling of anterior communicating artery aneurysm. Right frontal ventriculostomy. Ventricular size is slightly larger than on the presenting examination. Diminishing density of diffuse subarachnoid blood and and interhemispheric hematoma anteriorly. Electronically Signed   By: Paulina Fusi M.D.   On: 01/28/2015 07:30   Ct Head Wo Contrast  01/13/2015  CLINICAL DATA:  Found down outside by neighbors after going to the grocery store, elevated blood pressure at presentation EXAM: CT HEAD WITHOUT CONTRAST CT CERVICAL SPINE WITHOUT CONTRAST TECHNIQUE: Multidetector CT imaging of the head and cervical spine was performed following the standard protocol without intravenous contrast. Multiplanar CT image reconstructions of the cervical spine were also generated. COMPARISON:  None FINDINGS: CT HEAD FINDINGS Normal ventricular morphology. Extensive high attenuation subarachnoid hemorrhage identified throughout sulci, sylvian fissures, basilar cisterns, and interhemispheric fissure particularly anteriorly. Findings are highly suspicious for  ruptured intracranial aneurysm. No intraparenchymal hemorrhage, mass lesion or evidence acute infarction identified. Suspect minimal blood within fourth ventricle. Visualized paranasal sinuses and mastoid air cells clear. Calvarial thickening superolateral LEFT orbit, of uncertain chronicity and etiology. No other osseous abnormalities. CT CERVICAL SPINE FINDINGS Endotracheal tube present anterior to cervical spine. Prevertebral soft tissues normal thickness. Vertebral body heights maintained. Minimal disc space narrowing and endplate spur formation C5-C6. No fracture, subluxation or bone destruction. Visualized skullbase intact. Interseptal thickening at lung apices. Subarachnoid hemorrhage extends into the upper cervical spinal canal. IMPRESSION: Extensive high attenuation acute subarachnoid hemorrhage throughout basilar cisterns, sylvian fissures, interhemispheric fissure, and BILATERAL cortical sulci high suspicious for ruptured intracranial aneurysm. No acute intraparenchymal brain abnormalities. Minimal degenerative disc disease changes cervical spine. No acute cervical spine abnormalities. Critical Value/emergent results were called by telephone at the time of interpretation on 01/13/2015 at 1131 hr to Dr. Frederick Peers , who verbally acknowledged these results. Electronically Signed   By: Ulyses Southward M.D.   On: 01/13/2015 11:50   Ct Cervical Spine Wo Contrast  01/13/2015  CLINICAL DATA:  Found down outside by neighbors after going to the grocery store, elevated blood pressure at presentation EXAM: CT HEAD WITHOUT CONTRAST CT CERVICAL SPINE WITHOUT CONTRAST TECHNIQUE: Multidetector CT imaging of the head and cervical spine was performed following the standard protocol without intravenous contrast. Multiplanar CT image reconstructions of the cervical spine were also generated. COMPARISON:  None FINDINGS: CT HEAD FINDINGS Normal ventricular morphology. Extensive high attenuation subarachnoid hemorrhage  identified throughout sulci, sylvian fissures, basilar cisterns, and interhemispheric fissure particularly anteriorly. Findings are highly suspicious for ruptured intracranial aneurysm. No intraparenchymal hemorrhage, mass lesion or evidence acute infarction identified. Suspect minimal blood within fourth ventricle. Visualized paranasal sinuses and mastoid air cells clear. Calvarial thickening superolateral LEFT orbit, of uncertain chronicity and etiology. No other osseous abnormalities. CT CERVICAL SPINE FINDINGS Endotracheal tube present anterior to cervical spine. Prevertebral soft tissues normal thickness. Vertebral body heights maintained. Minimal disc space narrowing and endplate spur formation C5-C6. No fracture, subluxation or bone destruction. Visualized skullbase intact. Interseptal thickening at lung apices. Subarachnoid hemorrhage extends into the upper cervical spinal canal. IMPRESSION: Extensive high attenuation acute subarachnoid hemorrhage throughout basilar cisterns, sylvian fissures, interhemispheric fissure, and BILATERAL cortical sulci high suspicious for ruptured intracranial aneurysm. No acute intraparenchymal brain abnormalities. Minimal degenerative disc disease changes cervical spine. No acute cervical spine abnormalities. Critical Value/emergent results were called by telephone at the time of interpretation on 01/13/2015 at 1131 hr to Dr. Frederick Peers , who verbally acknowledged these results. Electronically Signed   By: Ulyses Southward M.D.   On: 01/13/2015 11:50   Dg Chest Port 1 View  02/06/2015  CLINICAL DATA:  Patient with history of fever. EXAM: PORTABLE CHEST 1 VIEW COMPARISON:  Chest radiograph 02/05/2015. FINDINGS: Tracheostomy tube terminates the mid trachea. Multiple monitoring leads overlie the patient. Stable enlarged cardiac and mediastinal contours. Elevation of the right hemidiaphragm. Unchanged heterogeneous opacities left lung base. No pleural effusion or pneumothorax.  IMPRESSION: Unchanged retrocardiac opacity which may represent atelectasis or infection. Electronically Signed   By: Annia Belt M.D.   On: 02/06/2015 08:51   Dg Chest Port 1 View  02/05/2015  CLINICAL DATA:  50 year old female with fever EXAM: PORTABLE CHEST 1 VIEW COMPARISON:  Prior chest x-ray obtained earlier today at 6 a.m. FINDINGS: Tracheostomy tube remains in stable position. Unchanged cardiomegaly. Unchanged bilateral retrocardiac airspace opacities with a linear configuration slightly more prominent on the left than the right remains unchanged. No acute osseous abnormality. IMPRESSION: No interval change in the appearance of the chest compared to earlier this morning. Persistent left greater than right basilar atelectasis. Electronically Signed   By: Malachy Moan M.D.   On: 02/05/2015 08:55   Dg Chest Port 1 View  02/05/2015  CLINICAL DATA:  Respiratory failure EXAM: PORTABLE CHEST 1 VIEW COMPARISON:  02/01/2015 chest radiograph. FINDINGS: Tracheostomy tube tip overlies the tracheal air column 3.5 cm above the carina. Stable cardiomediastinal silhouette with mild cardiomegaly. No pneumothorax. No pleural effusion. Stable mild platelike atelectasis at the left lung base. No pulmonary edema. IMPRESSION: Well-positioned tracheostomy tube. Stable mild cardiomegaly without overt pulmonary edema. Stable mild platelike atelectasis at the left lung base. Electronically Signed   By: Delbert Phenix M.D.   On: 02/05/2015 07:42   Dg Chest Port 1 View  02/01/2015  CLINICAL DATA:  Respiratory failure. EXAM: PORTABLE CHEST 1 VIEW COMPARISON:  January 28, 2015. FINDINGS: Stable cardiomediastinal silhouette. No pneumothorax or pleural effusion is noted. Tracheostomy tube is unchanged in position. Distal tip of feeding tube  is seen in expected position of distal stomach. Stable position of right internal jugular catheter line with distal tip in expected position of upper right atrium. Both lungs are clear. The  visualized skeletal structures are unremarkable. IMPRESSION: Stable support apparatus. No acute cardiopulmonary abnormality seen. Electronically Signed   By: Lupita Raider, M.D.   On: 02/01/2015 12:21   Dg Chest Port 1 View  01/28/2015  CLINICAL DATA:  Respiratory failure. EXAM: PORTABLE CHEST 1 VIEW COMPARISON:  01/27/2015 FINDINGS: Again noted is a tracheostomy tube. Jugular central line tip is in the upper right atrium. Feeding tube extends into the abdomen. There is mild peribronchial thickening with increased densities at the lung bases. Slightly low lung volumes. Heart size is stable. IMPRESSION: Low lung volumes with mild peribronchial thickening. Cannot exclude mild interstitial edema. Support apparatuses as described. Electronically Signed   By: Richarda Overlie M.D.   On: 01/28/2015 08:04   Dg Chest Port 1 View  01/27/2015  CLINICAL DATA:  New tracheostomy EXAM: PORTABLE CHEST 1 VIEW COMPARISON:  Chest radiograph from one day prior. FINDINGS: Tracheostomy tube tip overlies the tracheal air column just below the thoracic inlet. Enteric tube enters the stomach with the tip not seen on this image. Right internal jugular central venous catheter terminates at the cavoatrial junction. Stable cardiomediastinal silhouette with normal heart size. No pneumothorax. No pleural effusion. Stable mild left basilar atelectasis. No pulmonary edema. No new lung opacity. IMPRESSION: 1. Well-positioned tracheostomy tube. 2. Mild left basilar atelectasis. Otherwise no active cardiopulmonary disease. Electronically Signed   By: Delbert Phenix M.D.   On: 01/27/2015 11:33   Dg Chest Port 1 View  01/26/2015  CLINICAL DATA:  Respiratory failure. EXAM: PORTABLE CHEST 1 VIEW COMPARISON:  01/24/2015 FINDINGS: Endotracheal tube is 1.9 cm above the carina. Central line tip in the upper right atrium. Small nodular density in the right upper chest probably related to the ECG lead. Streaky densities in the left lower lung are  suggestive for atelectasis. Heart size is normal. Negative for a pneumothorax. Nasogastric tube extends into the abdomen. IMPRESSION: Few densities at the left lung base are suggestive for atelectasis. Otherwise, no focal airspace disease. Support apparatuses as described. Subtle nodular density in the right upper chest is probably related to the overlying support apparatus as described. Recommend attention to this area on follow up imaging. Electronically Signed   By: Richarda Overlie M.D.   On: 01/26/2015 07:42   Dg Chest Port 1 View  01/24/2015  CLINICAL DATA:  Endotracheal tube placement. EXAM: PORTABLE CHEST 1 VIEW COMPARISON:  01/23/2015 and 01/22/2015. FINDINGS: 0555 hours. The endotracheal tube has been advanced and is now 1 cm above the carina. Right IJ central venous catheter projects to the mid right atrial level. Nasogastric tube projects below the diaphragm, tip not visualized. The heart size and mediastinal contours are stable. There is stable mild atelectasis at both lung bases. No pneumothorax or significant pleural effusion identified. Multiple lines overlie the chest. IMPRESSION: The endotracheal tube is low with its tip within 1 cm of carina. For more optimal positioning, this could be withdrawn 3-4 cm. No other significant changes. Electronically Signed   By: Carey Bullocks M.D.   On: 01/24/2015 08:07   Dg Chest Port 1 View  01/23/2015  CLINICAL DATA:  Evaluate ET tube placement EXAM: PORTABLE CHEST 1 VIEW COMPARISON:  01/22/2015 FINDINGS: The endotracheal tube tip is above the carina. There is a right IJ catheter with tip in the right atrium.  Nasogastric tube tip is below the field of view. Normal heart size. Mild pulmonary edema is identified, increased from previous exam. IMPRESSION: 1. Stable support apparatus. 2. Mild pulmonary edema. Electronically Signed   By: Signa Kellaylor  Stroud M.D.   On: 01/23/2015 09:55   Dg Chest Port 1 View  01/22/2015  CLINICAL DATA:  Central line placement.   Initial encounter. EXAM: PORTABLE CHEST 1 VIEW COMPARISON:  Chest radiograph performed 01/21/2015 FINDINGS: The patient's new right IJ line is noted extending into the right atrium. This could be retracted approximately 5 cm, as deemed clinically appropriate. The endotracheal tube is seen ending 3-4 cm above the carina. A right PICC is noted ending about the proximal SVC. An enteric tube is noted extending below the diaphragm. The lungs are hypoexpanded. Mild left basilar atelectasis is noted. No pleural effusion or pneumothorax is seen. The cardiomediastinal silhouette is mildly enlarged. No acute osseous abnormalities are identified. IMPRESSION: 1. Right IJ line noted extending into the right atrium. This could be retracted approximately 5 cm to the cavoatrial junction, as deemed clinically appropriate. 2. Lungs hypoexpanded, with mild left basilar atelectasis. 3. Mild cardiomegaly. These results were called by telephone at the time of interpretation on 01/22/2015 at 2:19 am to [id number], who verbally acknowledged these results. Electronically Signed   By: Roanna RaiderJeffery  Chang M.D.   On: 01/22/2015 02:19   Dg Chest Port 1 View  01/21/2015  CLINICAL DATA:  Evaluate ET tube placement EXAM: PORTABLE CHEST 1 VIEW COMPARISON:  01/20/2015 FINDINGS: Right arm PICC line tip is in the cavoatrial junction. ET tube tip is above the carina. There is a nasogastric tube with tip in the stomach. Moderate cardiac enlargement. There is no pleural effusion or edema. No airspace consolidation. IMPRESSION: 1. ET tube tip in satisfactory position above the carina. 2. Stable cardiac enlargement. Electronically Signed   By: Signa Kellaylor  Stroud M.D.   On: 01/21/2015 07:57   Dg Chest Port 1 View  01/20/2015  CLINICAL DATA:  Intubated. EXAM: PORTABLE CHEST 1 VIEW COMPARISON:  01/19/2015 FINDINGS: The endotracheal tube lies 1 cm above the carina. Right PICC terminates over the cavoatrial junction. Cardiac silhouette remains mildly  enlarged. Enteric tube courses into the left upper abdomen with tip not imaged. There is improved aeration of the right lung base. Retrocardiac opacity in the left lung base is similar to the prior study. No sizable pleural effusion or pneumothorax is identified. IMPRESSION: 1. Endotracheal tube 1 cm above the carina. Suggest retracting slightly. 2. Improved aeration of the right lung base. Unchanged left basilar atelectasis. Electronically Signed   By: Sebastian AcheAllen  Grady M.D.   On: 01/20/2015 07:51   Dg Chest Port 1 View  01/19/2015  CLINICAL DATA:  Acute respiratory failure, subarachnoid hemorrhage. EXAM: PORTABLE CHEST 1 VIEW COMPARISON:  Portable chest x-ray of January 17, 2015 FINDINGS: The lungs are adequately inflated. There is subsegmental atelectasis in the retrocardiac region medially on the left and in the right infrahilar region. There is no significant pleural effusion. There is no pneumothorax. The cardiac silhouette is mildly enlarged. The pulmonary vascularity is not engorged. The endotracheal tube tip lies 2.6 cm above the carina. The esophagogastric tube tip projects below the inferior margin of the image. The PICC line tip projects over the midportion of the SVC. IMPRESSION: Mild bibasilar subsegmental atelectasis or infiltrate. Stable cardiomegaly without pulmonary edema. The support tubes are in reasonable position. Electronically Signed   By: David  SwazilandJordan M.D.   On: 01/19/2015 07:38  Dg Chest Port 1 View  01/17/2015  CLINICAL DATA:  Acute respiratory failure, acute intracranial hemorrhage. EXAM: PORTABLE CHEST 1 VIEW COMPARISON:  Portable chest x-ray of January 16, 2015 FINDINGS: There remains mild hypo inflation predominantly on the right. The pulmonary interstitial markings are minimally prominent though stable. There is no alveolar pneumonia. The endotracheal tube tip is 1.6 cm above the carina. The cardiac silhouette remains enlarged. The pulmonary vascularity is not engorged. The  esophagogastric tube tip projects below the inferior margin of the image. The observed bony thorax is unremarkable. IMPRESSION: 1. The endotracheal tube tip lies approximately 1.6 cm above the carina. Withdrawal by 2 cm is recommended. 2. Mild hypo inflation especially on the right. Minimal stable interstitial prominence bilaterally. There is no alveolar pneumonia. Electronically Signed   By: David  [place]and M.D.   On: 01/17/2015 08:27   Dg Chest Port 1 View  01/16/2015  CLINICAL DATA:  50 year old female with respiratory failure. EXAM: PORTABLE CHEST 1 VIEW COMPARISON:  01/14/2015 FINDINGS: Cardiomegaly, endotracheal tube with tip 1.8 cm above the carina, left IJ central venous catheter with tip overlying the upper SVC, and NG tube entering the stomach with tip off the field of view again noted. Pulmonary vascular congestion has decreased. Slightly low lung volumes noted with bibasilar atelectasis. There is no evidence of pneumothorax. IMPRESSION: Decreased pulmonary vascular congestion without other significant change. Electronically Signed   By: Harmon Pier M.D.   On: 01/16/2015 08:46   Dg Chest Port 1 View  01/14/2015  CLINICAL DATA:  Respiratory failure. EXAM: PORTABLE CHEST 1 VIEW COMPARISON:  01/13/2015. FINDINGS: Endotracheal tube tip is just above the orifice of the right mainstem bronchus. Proximal retraction of approximately 2 3 cm suggested. Left IJ line and NG tube in stable position. Stable cardiomegaly. Mild bilateral interstitial prominence. Mild congestive heart failure cannot be excluded. Low lung volumes. No pneumothorax. IMPRESSION: 1. Endotracheal tube tip is at the orifice of the right mainstem bronchus. Proximal repositioning of 2 to 3 cm suggested. Remaining lines and tubes in stable position. 2. Cardiomegaly with mild bilateral from interstitial prominence. A mild component of congestive heart failure cannot be excluded. 3. Low lung volumes . Critical Value/emergent results were called  by telephone at the time of interpretation on 01/14/2015 at 7:45 am to nurse Suzanna, who verbally acknowledged these results. Electronically Signed   By: Maisie Fus  Register   On: 01/14/2015 07:47   Dg Chest Portable 1 View  01/13/2015  CLINICAL DATA:  Central catheter placement EXAM: PORTABLE CHEST 1 VIEW COMPARISON:  Study obtained earlier in the day FINDINGS: Central catheter tip is in the superior vena cava. Nasogastric tube tip and side port are below the diaphragm. Endotracheal tube tip is 2.3 cm above the carina. No pneumothorax. There is mild bibasilar atelectasis. Lungs elsewhere clear. Heart is mildly enlarged with pulmonary vascularity within normal limits. No adenopathy. IMPRESSION: Tube and catheter positions as described without pneumothorax. Mild bibasilar atelectasis. Lungs elsewhere clear. No change in cardiac silhouette. Electronically Signed   By: Bretta Bang III M.D.   On: 01/13/2015 13:36   Dg Chest Portable 1 View  01/13/2015  CLINICAL DATA:  Check endotracheal tube placement EXAM: PORTABLE CHEST - 1 VIEW COMPARISON:  08/30/2005 FINDINGS: Cardiac shadow remains enlarged. An endotracheal tube is noted 4 cm above the carina. The lungs are well aerated bilaterally with mild peribronchial cuffing bilaterally which may represent bronchitis. No focal confluent infiltrate is seen. IMPRESSION: Endotracheal tube in satisfactory position. Mild central peribronchial  cuffing Electronically Signed   By: Alcide Clever M.D.   On: 01/13/2015 12:02   Dg Abd Portable 1v  02/09/2015  CLINICAL DATA:  New onset nausea. EXAM: PORTABLE ABDOMEN - 1 VIEW COMPARISON:  01/27/2015 FINDINGS: There is a gastrostomy tube present. There is no bowel dilatation to suggest obstruction. There is no evidence of pneumoperitoneum, portal venous gas or pneumatosis. There are no pathologic calcifications along the expected course of the ureters. The osseous structures are unremarkable. IMPRESSION: Negative.  Electronically Signed   By: Elige Ko   On: 02/09/2015 08:17   Dg Abd Portable 1v  01/27/2015  CLINICAL DATA:  Patient status post feeding tube placement. EXAM: PORTABLE ABDOMEN - 1 VIEW COMPARISON:  Abdominal radiograph 01/13/2015. FINDINGS: Feeding tube is present with tip projecting at the gastric antrum. Gas is demonstrated throughout the colon. No definite evidence for free intraperitoneal air. Cardiomegaly. Focal consolidative opacity within the left lung base. IMPRESSION: Feeding tube tip projects at the gastric antrum. Focal nodular consolidative opacity within the left lung base potentially secondary to an infectious process or atelectasis. Continued radiographic follow-up is recommended to ensure resolution. Electronically Signed   By: Annia Belt M.D.   On: 01/27/2015 11:39   Dg Abd Portable 1v  01/13/2015  CLINICAL DATA:  OG tube placement. EXAM: PORTABLE ABDOMEN - 1 VIEW COMPARISON:  08/30/2005 FINDINGS: Enteric tube is present with tip in the right mid abdomen consistent with location in the second-third portion of the duodenum. Evaluation of the abdomen is limited due to motion artifact but there is no gross evidence of any gaseous distention of bowel. IMPRESSION: Enteric tube tip is in the right mid abdomen consistent with location in the second-third portion of the duodenum. Electronically Signed   By: Burman Nieves M.D.   On: 01/13/2015 22:48     CBC  Recent Labs Lab 02/03/15 0249 02/04/15 0542 02/05/15 0233 02/06/15 0442 02/08/15 0508  WBC 18.5* 11.3* 15.4* 27.0* 15.1*  HGB 11.7* 10.5* 10.9* 11.1* 9.5*  HCT 35.2* 32.4* 34.0* 32.3* 29.4*  PLT 311 337 283 367 289  MCV 95.9 96.1 95.8 95.8 96.4  MCH 31.9 31.2 30.7 32.9 31.1  MCHC 33.2 32.4 32.1 34.4 32.3  RDW 15.4 15.7* 15.5 16.6* 16.0*  LYMPHSABS 1.9 2.3 1.7 1.4  --   MONOABS 0.9 0.7 1.0 1.4*  --   EOSABS 0.0 0.2 0.0 0.0  --   BASOSABS 0.0 0.0 0.0 0.0  --     Chemistries   Recent Labs Lab 02/03/15 0249  02/04/15 0542 02/05/15 0233 02/06/15 0442 02/08/15 0508  NA 138 142 139 142 149*  K 4.1 3.7 4.1 3.8 3.7  CL 106 111 106 115* 119*  CO2 21* 22 23 18* 22  GLUCOSE 154* 143* 139* 182* 129*  BUN 25* 21* 17 18 20   CREATININE 0.50 0.55 0.54 0.54 0.53  CALCIUM 9.3 9.2 9.3 9.3 8.9  MG 1.9 1.9 1.8 1.9  --    ------------------------------------------------------------------------------------------------------------------ estimated creatinine clearance is 81.7 mL/min (by C-G formula based on Cr of 0.53). ------------------------------------------------------------------------------------------------------------------  Recent Labs  02/08/15 0508  HGBA1C 7.1*   ------------------------------------------------------------------------------------------------------------------ No results for input(s): CHOL, HDL, LDLCALC, TRIG, CHOLHDL, LDLDIRECT in the last 72 hours. ------------------------------------------------------------------------------------------------------------------ No results for input(s): TSH, T4TOTAL, T3FREE, THYROIDAB in the last 72 hours.  Invalid input(s): FREET3 ------------------------------------------------------------------------------------------------------------------ No results for input(s): VITAMINB12, FOLATE, FERRITIN, TIBC, IRON, RETICCTPCT in the last 72 hours.  Coagulation profile No results for input(s): INR, PROTIME in the last 168 hours.  No results for input(s): DDIMER in the last 72 hours.  Cardiac Enzymes  Recent Labs Lab 02/04/15 0542  CKMB 5.4*   ------------------------------------------------------------------------------------------------------------------ Invalid input(s): POCBNP   Time Spent in minutes   35   Averill Pons K M.D on 02/09/2015 at 9:16 AM  Between 7am to 7pm - Pager - [phone]  After 7pm go to [email or web address] - password [facility]  [facility] -  Office  [phone]

## 2015-02-09 NOTE — Clinical Social Work Note (Signed)
Clinical Social Worker has contacted Wellsite geologistmedical director to pursue additional facilities for LOG placement. [facility].   Pt's son, Jeannett SeniorStephen upset in regards to pt's medical readiness and discharge planning. Pt's son requesting to speak with Attending MD. CSW notified MD. MD planning to contact son [phone].  CSW to notify RNCM. CSW will continue to follow pt and pt's family for continued support and to facilitate pt's discharge needs.   Derenda FennelBashira Sahan Pen, MSW, LCSWA [phone] 02/09/2015 1:57 PM

## 2015-02-09 NOTE — NC FL2 (Signed)
[place] MEDICAID FL2 LEVEL OF CARE SCREENING TOOL     IDENTIFICATION  Patient Name: Rachel MinksStephanie N Vang Birthdate: 08/25/1965 Sex: female Admission Date (Current Location): 01/13/2015  [facility] [place]   Facility and Address:  [facility], [address], [place], [place] [address]      Provider Number: [id number]  Attending Physician Name and Address:  Leroy SeaPrashant K Singh, MD  Relative Name and Phone Number:       Current Level of Care: Hospital Recommended Level of Care: Skilled Nursing Facility Prior Approval Number:    Date Approved/Denied:   PASRR Number:   [id number]   Discharge Plan: SNF    Current Diagnoses: Patient Active Problem List   Diagnosis Date Noted  . History of ETT   . Hydrocephalus   . 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 Cerebral edema (HCC)   . Respiratory failure (HCC)   . Central line complication   . Encounter for central line care   . Encounter for feeding tube placement   . Neurological abnormality   . Subarachnoid hemorrhage (HCC)   . Acute respiratory failure with hypoxia and hypercapnia (HCC)   . Encounter for orogastric (OG) tube placement   . SAH (subarachnoid hemorrhage) (HCC)   . ICH (intracerebral hemorrhage) (HCC) 01/13/2015    Orientation RESPIRATION BLADDER Height & Weight    Self  Tracheostomy Incontinent 5\' 5"  (165.1 cm) 154 lbs.  BEHAVIORAL SYMPTOMS/MOOD NEUROLOGICAL BOWEL NUTRITION STATUS   (NONE )  (NONE ) Incontinent Diet, Feeding tube  AMBULATORY STATUS COMMUNICATION OF NEEDS Skin   Extensive Assist Non-Verbally Surgical wounds                       Personal Care Assistance Level of Assistance  Bathing, Dressing, Feeding Bathing Assistance: Maximum assistance Feeding assistance: Maximum assistance Dressing Assistance: Maximum assistance     Functional Limitations Info  Speech     Speech Info: Impaired    SPECIAL CARE FACTORS FREQUENCY  PT (By licensed  PT), OT (By licensed OT)     PT Frequency: 3 OT Frequency: 2            Contractures      Additional Factors Info  Code Status, Allergies Code Status Info: FULL CODE  Allergies Info: N/A           Current Medications (02/09/2015):  This is the current hospital active medication list Current Facility-Administered Medications  Medication Dose Route Frequency Provider Last Rate Last Dose  . acetaminophen (TYLENOL) solution 650 mg  650 mg Per Tube Q4H PRN Merwyn Katosavid B Simonds, MD   650 mg at 02/08/15 1053  . amLODipine (NORVASC) tablet 10 mg  10 mg Oral Daily Leroy SeaPrashant K Singh, MD   10 mg at 02/08/15 1632  . antiseptic oral rinse (CPC / CETYLPYRIDINIUM CHLORIDE 0.05%) solution 7 mL  7 mL Mouth Rinse q12n4p Alyson ReedyWesam G Yacoub, MD   7 mL at 02/08/15 1633  . bethanechol (URECHOLINE) tablet 10 mg  10 mg Oral 3 times per day Coralyn HellingVineet Sood, MD   10 mg at 02/09/15 0608  . chlorhexidine (PERIDEX) 0.12 % solution 15 mL  15 mL Mouth Rinse BID Alyson ReedyWesam G Yacoub, MD   15 mL at 02/08/15 2121  . famotidine (PEPCID) 40 MG/5ML suspension 20 mg  20 mg Per Tube BID Storm FriskPatrick E Wright, MD   20 mg at 02/08/15 2137  . feeding supplement (JEVITY 1.2 CAL) liquid  1,000 mL  1,000 mL Per Tube Continuous Arlyss Gandy, RD 50 mL/hr at 02/08/15 2121 1,000 mL at 02/08/15 2121  . feeding supplement (PRO-STAT SUGAR FREE 64) liquid 30 mL  30 mL Per Tube BID Arlyss Gandy, RD   30 mL at 02/08/15 2121  . fentaNYL (DURAGESIC - dosed mcg/hr) patch 25 mcg  25 mcg Transdermal Q72H Leroy Sea, MD   25 mcg at 02/09/15 0820  . insulin aspart (novoLOG) injection 0-20 Units  0-20 Units Subcutaneous 6 times per day Alyson Reedy, MD   3 Units at 02/09/15 0430  . insulin glargine (LANTUS) injection 10 Units  10 Units Subcutaneous BID Alyson Reedy, MD   10 Units at 02/08/15 2137  . levETIRAcetam (KEPPRA) 100 MG/ML solution 500 mg  500 mg Per Tube BID Coralyn Helling, MD   500 mg at 02/08/15 2121  . lisinopril (PRINIVIL,ZESTRIL) tablet 10  mg  10 mg Oral Daily Leroy Sea, MD      . metoprolol (LOPRESSOR) injection 5 mg  5 mg Intravenous Q4H PRN Coralyn Helling, MD   5 mg at 02/08/15 1053  . metoprolol (LOPRESSOR) tablet 100 mg  100 mg Oral BID Leroy Sea, MD   100 mg at 02/08/15 2121  . simvastatin (ZOCOR) tablet 20 mg  20 mg Per Tube q1800 Coralyn Helling, MD   20 mg at 02/08/15 1814  . vancomycin (VANCOCIN) 50 mg/mL oral solution 500 mg  500 mg Oral 4 times per day Lupita Leash, MD   500 mg at 02/09/15 0608     Discharge Medications: Please see discharge summary for a list of discharge medications.  Relevant Imaging Results:  Relevant Lab Results:   Additional Information    Vaughan Browner, LCSW

## 2015-02-09 NOTE — Clinical Social Work Placement (Signed)
   CLINICAL SOCIAL WORK PLACEMENT  NOTE  Date:  02/09/2015  Patient Details  Name: Rachel Vang MRN: [id number] Date of Birth: 12/13/1965  Clinical Social Work is seeking post-discharge placement for this patient at the Skilled  Nursing Facility level of care (*CSW will initial, date and re-position this form in  chart as items are completed):  Yes   Patient/family provided with [facility] Clinical Social Work Department's list of facilities offering this level of care within the geographic area requested by the patient (or if unable, by the patient's family).  Yes   Patient/family informed of their freedom to choose among providers that offer the needed level of care, that participate in Medicare, Medicaid or managed care program needed by the patient, have an available bed and are willing to accept the patient.  Yes   Patient/family informed of [facility]'s ownership interest in [facility] and [facility], as well as of the fact that they are under no obligation to receive care at these facilities.  PASRR submitted to EDS on 02/09/15     PASRR number received on 02/09/15     Existing PASRR number confirmed on       FL2 transmitted to all facilities in geographic area requested by pt/family on 02/09/15     FL2 transmitted to all facilities within larger geographic area on 02/09/15     Patient informed that his/her managed care company has contracts with or will negotiate with certain facilities, including the following:            Patient/family informed of bed offers received.  Patient chooses bed at       Physician recommends and patient chooses bed at      Patient to be transferred to   on  .  Patient to be transferred to facility by       Patient family notified on   of transfer.  Name of family member notified:        PHYSICIAN Please prepare prescriptions, Please prepare priority discharge summary, including medications, Please sign FL2      Additional Comment:    _______________________________________________ Vaughan BrownerNixon, Rachel Debarge A, LCSW 02/09/2015, 10:12 PM

## 2015-02-09 NOTE — Clinical Social Work Note (Signed)
Clinical Social Work Assessment  Patient Details  Name: Rachel MinksStephanie N Vang MRN: [id number] Date of Birth: 05/04/1965  Date of referral:  02/09/15               Reason for consult:  Facility Placement, Discharge Planning                Permission sought to share information with:  Family Supports, Magazine features editoracility Contact Representative, Case Estate manager/land agentManager Permission granted to share information::  Yes, Verbal Permission Granted  Name::     Rachel BlackbirdStephen Vang  Agency::   (SNF's )  Relationship::   (Son)  Contact Information:   [phone]  Housing/Transportation Living arrangements for the past 2 months:  Single Family Home Source of Information:  Adult Children Patient Interpreter Needed:  None Criminal Activity/Legal Involvement Pertinent to Current Situation/Hospitalization:  No - Comment as needed Significant Relationships:  Adult Children Lives with:  Self Do you feel safe going back to the place where you live?  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 Patient unresponsive/non verbal and requiring SNF placement. Family unable to manage patient at home.   Social Worker assessment / plan:  Visual merchandiserClinical Social Worker spoke with patient's son, Rachel SeniorStephen several times throughout the day in regards to post-acute placement for SNF. CSW explained Letter of Guarantee (LOG) placement and potential LOG options. Pt's son expressed desire for pt to remain in [place] area. CSW again reviewed LOG process and option for private pay if family wishes for patient to remain local. Family unable to pay privately. Pt's son expressed further concerns of pt not being medically stable for discharge. CSW shared that per MD pt is stable to transition to the next level of care (SNF). CSW extended offer for pt's son to speak directly to Attending MD for medical updates. Pt's son accepted and CSW paged MD to notify. MD planning to contact family in regards to  discharge planning. CSW has discussed case with MD, RNCM, Medical Director and CSW AD. CSW has also received approval from medical director to [facility] [place] [facility]. Covering CSW to follow up with facilities in the morning.   +C DIFF/ vancomycin Fentanyl patch  Trach at 28% Non-verbal   No further concerns reported by family at this time. CSW will continue to follow pt and pt's family for continued support and to facilitate pt's discharge needs once medically stable.   Employment status:  Unemployed Health and safety inspectornsurance information:  Self Pay (Medicaid Pending) PT Recommendations:  Skilled Nursing Facility Information / Referral to community resources:  Skilled Nursing Facility  Patient/Family's Response to care: Pt disoriented. Pt's son, Rachel SeniorStephen agreeable to SNF however upset regarding out of county placement. Pt's son has fully expressed concerns of believing pt is NOT medically stable. MD informed to contact family.   Patient/Family's Understanding of and Emotional Response to Diagnosis, Current Treatment, and Prognosis:  CSW unsure if family fully understands pt's diagnosis/prognosis and/or if family has realistic expectations moving forward.   Emotional Assessment Appearance:  Appears stated age Attitude/Demeanor/Rapport:  Unable to Assess Affect (typically observed):  Unable to Assess Orientation:  Oriented to Self Alcohol / Substance use:  Not Applicable Psych involvement (Current and /or in the community):  No (Comment)  Discharge Needs  Concerns to be addressed:  Adjustment to Illness, Care Coordination Readmission within the last 30 days:  No Current discharge risk:  Cognitively Impaired, Dependent with Mobility,  Substance Abuse Barriers to Discharge:  No SNF bed   Derenda Fennel, MSW, LCSWA [phone] 02/09/2015 10:15 PM

## 2015-02-09 NOTE — Progress Notes (Addendum)
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  pt with Seizure activity per EMS, intubated 12/15 - 12/19, and now post Trach and Peg.  pt with hx of HTN, Etoh, and Medical Non-compliance.    PT Comments    Pt progressing towards physical therapy goals. Focus of session was repositioning in bed. Was able to tolerate repositioning on her R side and diastolic BP decreased from 102 at beginning of session to 74 at end of session. Head positioned in neutral against the mattress with no pillow (to discourage L cervical rotation and encourage stretch of the L side of neck). Pt appears comfortable and in no distress. Will continue to follow.   Follow Up Recommendations  SNF     Equipment Recommendations  None recommended by PT    Recommendations for Other Services       Precautions / Restrictions Precautions Precautions: Fall Precaution Comments: ?Anoxic Injury as pt unable to be intubated by EMS due to jaw clenched and has to be paralyzed in ED for intubation.   Restrictions Weight Bearing Restrictions: No    Mobility  Bed Mobility Overal bed mobility: Needs Assistance;+2 for physical assistance Bed Mobility: Rolling Rolling: Total assist;+2 for physical assistance         General bed mobility comments: Focus of session was repositioning in bed and rolling to right side for neck stretch/alignment. No active movement during transition to R side.  Transfers                    Ambulation/Gait                 Stairs            Wheelchair Mobility    Modified Rankin (Stroke Patients Only)       Balance                                    Cognition Arousal/Alertness: Lethargic Behavior  During Therapy: Flat affect Overall Cognitive Status: Impaired/Different from baseline Area of Impairment: Attention;Following commands   Current Attention Level: Focused   Following Commands: Follows one step commands inconsistently       General Comments: Pt awake mostly, and was able to squeeze my hand with her L hand when cued (not sure if this was purposeful or reflexive). Not able to do so with the R hand. Looks at therapist briefly to her first name.     Exercises Other Exercises Other Exercises: PROM of the neck to the right for stretch    General Comments General comments (skin integrity, edema, etc.): Diastolic BP upon entering room was 102 (taken ~2 minutes before therapist entered). Check BP again and was 184/99. Upon R-sided roll, BP continued to drop and was stable at 124/74 at end of session. Nursing updated.       Pertinent Vitals/Pain Pain Assessment: Faces Faces Pain Scale: No hurt    Home Living                      Prior Function            PT Goals (current goals can now be found in the  care plan section) Acute Rehab PT Goals Patient Stated Goal: pt unable to state. PT Goal Formulation: Patient unable to participate in goal setting Time For Goal Achievement: 02/17/15 Potential to Achieve Goals: Fair Progress towards PT goals: Progressing toward goals    Frequency  Min 2X/week    PT Plan Frequency needs to be updated    Co-evaluation             End of Session Equipment Utilized During Treatment: Oxygen (trach collar) Activity Tolerance: Patient tolerated treatment well Patient left: in bed     Time: 1106-1130 PT Time Calculation (min) (ACUTE ONLY): 24 min  Charges:  $Therapeutic Activity: 23-37 mins                    G Codes:      Conni SlipperKirkman, Devinne Epstein 02/09/2015, 1:40 PM   Conni SlipperLaura Gracy Ehly, PT, DPT Acute Rehabilitation Services Pager: [phone]

## 2015-02-09 NOTE — Clinical Social Work Note (Signed)
Clinical Social Worker has assessed patient and pt's family. Full psychosocial assessment to follow.   Family agreeable to SNF placement. FL-2 completed and faxed to LOG SNF's. Currently no bed offers.   CSW remains available as needed.  Rachel Vang, MSW, LCSWA [phone] 02/09/2015 11:25 AM

## 2015-02-10 LAB — CULTURE, BLOOD (ROUTINE X 2)
CULTURE: NO GROWTH
CULTURE: NO GROWTH

## 2015-02-10 LAB — COMPREHENSIVE METABOLIC PANEL
ALT: 134 U/L — AB (ref 14–54)
AST: 62 U/L — AB (ref 15–41)
Albumin: 2.3 g/dL — ABNORMAL LOW (ref 3.5–5.0)
Alkaline Phosphatase: 136 U/L — ABNORMAL HIGH (ref 38–126)
Anion gap: 9 (ref 5–15)
BUN: 22 mg/dL — AB (ref 6–20)
CHLORIDE: 112 mmol/L — AB (ref 101–111)
CO2: 25 mmol/L (ref 22–32)
CREATININE: 0.54 mg/dL (ref 0.44–1.00)
Calcium: 9.1 mg/dL (ref 8.9–10.3)
GFR calc non Af Amer: >60 mL/min (ref >60)
Glucose, Bld: 182 mg/dL — ABNORMAL HIGH (ref 65–99)
POTASSIUM: 3.8 mmol/L (ref 3.5–5.1)
SODIUM: 146 mmol/L — AB (ref 135–145)
Total Bilirubin: 1.2 mg/dL (ref 0.3–1.2)
Total Protein: 6.1 g/dL — ABNORMAL LOW (ref 6.5–8.1)

## 2015-02-10 LAB — CBC
HEMATOCRIT: 31.1 % — AB (ref 36.0–46.0)
HEMOGLOBIN: 9.9 g/dL — AB (ref 12.0–15.0)
MCH: 31.1 pg (ref 26.0–34.0)
MCHC: 31.8 g/dL (ref 30.0–36.0)
MCV: 97.8 fL (ref 78.0–100.0)
Platelets: 272 10*3/uL (ref 150–400)
RBC: 3.18 MIL/uL — ABNORMAL LOW (ref 3.87–5.11)
RDW: 16.1 % — AB (ref 11.5–15.5)
WBC: 6.2 10*3/uL (ref 4.0–10.5)

## 2015-02-10 LAB — MAGNESIUM: MAGNESIUM: 2 mg/dL (ref 1.7–2.4)

## 2015-02-10 LAB — GLUCOSE, CAPILLARY
GLUCOSE-CAPILLARY: 163 mg/dL — AB (ref 65–99)
GLUCOSE-CAPILLARY: 145 mg/dL — AB (ref 65–99)
Glucose-Capillary: 138 mg/dL — ABNORMAL HIGH (ref 65–99)
Glucose-Capillary: 142 mg/dL — ABNORMAL HIGH (ref 65–99)
Glucose-Capillary: 151 mg/dL — ABNORMAL HIGH (ref 65–99)
Glucose-Capillary: 93 mg/dL (ref 65–99)

## 2015-02-10 MED ORDER — METOPROLOL TARTRATE 50 MG PO TABS
125.0000 mg | ORAL_TABLET | Freq: Two times a day (BID) | ORAL | Status: DC
  Administered 2015-02-11 – 2015-02-14 (×8): 125 mg via ORAL
  Filled 2015-02-10 (×8): qty 1

## 2015-02-10 MED ORDER — CLONIDINE HCL 0.1 MG PO TABS
0.1000 mg | ORAL_TABLET | Freq: Three times a day (TID) | ORAL | Status: DC
  Administered 2015-02-10 – 2015-02-14 (×13): 0.1 mg via ORAL
  Filled 2015-02-10 (×13): qty 1

## 2015-02-10 NOTE — Procedures (Signed)
Tracheostomy tube change:    The old  # 8 cuffed trach was carefully removed. the tracheostomy site appeared: clean. A new #  6 Cuffed trach was easily placed in the tracheostomy stoma and secured with velcro trach ties. The tracheostomy was patent, good color change observed via EZ-CAP. Pt tolerated the procedure well with no immediate complications.   Will need to be changed to 6 cuffless prior to d/c to SNF.   Simonne MartinetPeter E Donata Reddick ACNP-BC [facility] Pager # [phone] OR # [phone] if no answer

## 2015-02-10 NOTE — Progress Notes (Signed)
Per NP to hold 10pm meds until after pt returns from ultrasound.  Will continue to monitor.   Estanislado EmmsAshley Schwarz, RN

## 2015-02-10 NOTE — Progress Notes (Signed)
Patient Demographics:    Rachel Vang, is a 50 y.o. female, DOB - Mar 09, 1965, ZOX:[id number]  Admit date - 01/13/2015   Admitting Physician Alyson Reedy, MD  Outpatient Primary MD for the patient is No primary care provider on file.  LOS - 28   Chief Complaint  Patient presents with  . Altered Mental Status     Summary  50yr old AA female with H/O HTN who was noncompliant with her medications, one pack-a-day smoker, daily beer drinker, who was shopping at [employer] and fell down, when she was brought to the ER she was found to have right-sided pupil dilation, in the ER workup was consistent with intracranial bleed with suspected aneurysm, she was intubated, she was noted to the ICU, she was seen by trauma and neurosurgery. 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 She was subsequently treated, PEG tube was placed. During her hospital stay she developed C. difficile colitis, influenza infection and Escherichia coli UTI.  She remains unresponsive and only moves her extremities to painful stimuli, she was transferred out of ICU under hospitalist service on 02/08/2015 on day 26 of her hospital stay.    Subjective:    Rachel Vang today remains unresponsive in bed, in no discomfort. Moves all 4 extremities to painful stimuli.   Assessment  & Plan :     1. Subarachnoid bleed. Status post ACA aneurysm coiling and IVC drain placement (now out) . She remains minimally responsive and moves only to painful stimuli, she has trach and PEG. She is currently on pneumonia pain which will be stopped since 21 days are over, Decadron 1 mg IV twice a day which will be stopped, continue Keppra along with statin. I discussed this with  neurosurgeon on call Dr. Conchita Paris on 02/08/2015. Continue supportive care will require placement.   2. Dysphagia - Unable to maintain Airway , low functional state and extreme deconditioning- due to #1 above. Supportive care. Has tracheostomy with trach collar, PEG tube for tube feeds.   3. C. difficile colitis. Continue vancomycin orally why a PEG tube. DC rectal tube on 02/09/2015.   4. Essential hypertension. In poor control. Place on Lopressor and Norvasc along with as needed IV Lopressor and monitor.   5. Irritable bladder. Continue bethanechol.   6.Elevated troponin on admission - Likely demand ischemia (normal echo 01/14/15). On beta blocker and statin, cannot use aspirin due to bleed. Currently not a candidate for further workup.   7. Possible seizure prior to admission. On Keppra continue.    8. Haemophilus influenza tracheobronchitis, Escherichia coli UTI. Has finished treatment for both.   9. Mildly elevated liver enzymes. Patient unable to answer whether she has abdominal pain, right upper quadrant is soft without any rebound or guarding, will trend liver enzymes and check a right upper quadrant ultrasound. Might be elevated likely due to tube feeds. We will monitor.    10. Undiagnosed DM2 - SSI + Lantus   Lab Results  Component Value Date   HGBA1C 7.1* 02/08/2015    CBG (last 3)   Recent Labs  02/10/15 0004 02/10/15 0416 02/10/15 0736  GLUCAP 142* 151* 163*      Code Status :  DNR  Family Communication  :   Called and left a detailed message on son's Mr. Koryn Charlot cell phone on 02/09/2015 phone number [phone].  I called again and talked to the son on 02/10/2015. He was complaining that so far no physician has called him to update and her mother has been admitted for 28 days, I informed him that I resumed his mother's care 2 days ago and I have already called them twice.   He also complains that he was never told when his mother will be  moving out of the ICU. I requested him to get in touch with the ICU team to clarify this.  Further says that he wants his mother to stay in this hospital as he would have hard time commuting to rehabilitation facilities outside [place] as he lives in [place]. I told him to get in touch with social worker for placement issues.   I informed him in detail that his mother is not acutely sick however her long-term prognosis is not very good due to a near nonfunctional state, being bedbound, being fed through tube feeds, she remains high risk for aspiration and decubitus ulcer along with UTI and other potential complications in the future.    Disposition Plan  : SNF  Consults  :  N.Surg, PCCM, Trauma  Procedures  :   LINES/TUBES: 12/15 ETT >> 12/29 12/15 Rt IJ CVL >>12/26 12/15 IVC >> 1/6 12/18 R PICC >> 12/23 12/23 R IJ TLC>>>plan 1/3  Trach (JY) 12/29 >> PEG (JW) 1/4>> Foley 1/3>> PIV x1  STUDIES: 12/15 CT head >> [facility] 12/16 Echo >> severe LVH, EF 60 to 65%, grade 1 diastolic dysfx  SIGNIFICANT EVENTS: 12/15 Admit, [facility] >> coiling distal ACA aneurysm, IVC drain placed 12/29 Trach (JY) >> 12/30 Fever 01/03 Fever >> lines changed 01/07 C diff positive 01/09 To telemetry -   to hospitalist care on 02/08/2015  DVT Prophylaxis  :   SCDs    Lab Results  Component Value Date   PLT 272 02/10/2015    Inpatient Medications  Scheduled Meds: . amLODipine  10 mg Oral Daily  . antiseptic oral rinse  7 mL Mouth Rinse q12n4p  . bethanechol  10 mg Oral 3 times per day  . chlorhexidine  15 mL Mouth Rinse BID  . famotidine  20 mg Per Tube BID  . feeding supplement (PRO-STAT SUGAR FREE 64)  30 mL Per Tube BID  . fentaNYL  25 mcg Transdermal Q72H  . insulin aspart  0-20 Units Subcutaneous 6 times per day  . insulin glargine  10 Units Subcutaneous BID  . levETIRAcetam  500 mg Per Tube BID  . lisinopril  10 mg Oral Daily  . metoprolol tartrate  100 mg Oral BID  . simvastatin   20 mg Per Tube q1800  . vancomycin  500 mg Oral 4 times per day   Continuous Infusions: . feeding supplement (JEVITY 1.2 CAL) 1,000 mL (02/09/15 2214)   PRN Meds:.acetaminophen (TYLENOL) oral liquid 160 mg/5 mL, metoprolol  Antibiotics  :    Anti-infectives    Start     Dose/Rate Route Frequency Ordered Stop   02/05/15 1200  vancomycin (VANCOCIN) 50 mg/mL oral solution 500 mg     500 mg Oral 4 times per day 02/05/15 1141     02/03/15 1400  cephALEXin (KEFLEX) 250 MG/5ML suspension 500 mg     500 mg Oral 3 times per day 02/03/15 1146 02/07/15 2351   02/02/15 0200  vancomycin (VANCOCIN) IVPB 750 mg/150 ml premix  Status:  Discontinued     750 mg 150 mL/hr over 60 Minutes Intravenous Every 12 hours 02/01/15 1301 02/03/15 1147   02/01/15 1400  cefTAZidime (FORTAZ) 2 g in dextrose 5 % 50 mL IVPB  Status:  Discontinued     2 g 100 mL/hr over 30 Minutes Intravenous 3 times per day 02/01/15 1258 02/03/15 1147   02/01/15 1300  vancomycin (VANCOCIN) 1,500 mg in sodium chloride 0.9 % 500 mL IVPB     1,500 mg 250 mL/hr over 120 Minutes Intravenous  Once 02/01/15 1258 02/01/15 1518   01/23/15 0800  piperacillin-tazobactam (ZOSYN) IVPB 3.375 g     3.375 g 12.5 mL/hr over 240 Minutes Intravenous Every 8 hours 01/23/15 0745 01/25/15 1236   01/19/15 1130  vancomycin (VANCOCIN) IVPB 750 mg/150 ml premix  Status:  Discontinued     750 mg 150 mL/hr over 60 Minutes Intravenous Every 8 hours 01/19/15 1101 01/22/15 1329   01/19/15 1100  piperacillin-tazobactam (ZOSYN) IVPB 3.375 g  Status:  Discontinued     3.375 g 12.5 mL/hr over 240 Minutes Intravenous Every 8 hours 01/19/15 1058 01/22/15 1330   01/13/15 2300  ceFAZolin (ANCEF) IVPB 1 g/50 mL premix  Status:  Discontinued     1 g 100 mL/hr over 30 Minutes Intravenous Every 8 hours 01/13/15 1445 01/15/15 1530   01/13/15 1500  ceFAZolin (ANCEF) IVPB 2 g/50 mL premix     2 g 100 mL/hr over 30 Minutes Intravenous  Once 01/13/15 1445 01/13/15 1545    01/13/15 1445  ceFAZolin (ANCEF) powder 2 g  Status:  Discontinued     2 g Other  Once 01/13/15 1436 01/13/15 1443   01/13/15 1445  ceFAZolin (ANCEF) powder 1 g  Status:  Discontinued     1 g Other 3 times per day 01/13/15 1436 01/13/15 1445        Objective:   Filed Vitals:   02/10/15 0441 02/10/15 0451 02/10/15 0459 02/10/15 1007  BP:   153/89   Pulse: 109  100 109  Temp:   99.3 F (37.4 C)   TempSrc:   Axillary   Resp: 18  20 20   Height:      Weight:  69.8 kg (153 lb 14.1 oz)    SpO2: 100%  100% 100%    Wt Readings from Last 3 Encounters:  02/10/15 69.8 kg (153 lb 14.1 oz)     Intake/Output Summary (Last 24 hours) at 02/10/15 1045 Last data filed at 02/10/15 0859  Gross per 24 hour  Intake    200 ml  Output   1700 ml  Net  -1500 ml     Physical Exam  Awake but unresponsive, moves all 4 extremities to painful stimuli [place].AT,PERRAL Supple Neck, Trach site clean, No JVD, No cervical lymphadenopathy appriciated.  Symmetrical Chest wall movement, Good air movement bilaterally, CTAB RRR,No Gallops,Rubs or new Murmurs, No Parasternal Heave +ve B.Sounds, Abd Soft, No tenderness, No organomegaly appriciated, No rebound - guarding or rigidity. PEG site clean, Foley in place No Cyanosis, Clubbing or edema, No new Rash or bruise       Data Review:   Micro Results Recent Results (from the past 240 hour(s))  Culture, Urine     Status: None   Collection Time: 02/01/15 10:49 AM  Result Value Ref Range Status   Specimen Description URINE, CATHETERIZED  Final   Special Requests NONE  Final   Culture >=100,000 COLONIES/mL ESCHERICHIA  COLI  Final   Report Status 02/03/2015 FINAL  Final   Organism ID, Bacteria ESCHERICHIA COLI  Final      Susceptibility   Escherichia coli - MIC*    AMPICILLIN 4 SENSITIVE Sensitive     CEFAZOLIN <=4 SENSITIVE Sensitive     CEFTRIAXONE <=1 SENSITIVE Sensitive     CIPROFLOXACIN <=0.25 SENSITIVE Sensitive     GENTAMICIN <=1 SENSITIVE  Sensitive     IMIPENEM <=0.25 SENSITIVE Sensitive     NITROFURANTOIN <=16 SENSITIVE Sensitive     TRIMETH/SULFA <=20 SENSITIVE Sensitive     AMPICILLIN/SULBACTAM 4 SENSITIVE Sensitive     PIP/TAZO <=4 SENSITIVE Sensitive     * >=100,000 COLONIES/mL ESCHERICHIA COLI  Culture, respiratory (NON-Expectorated)     Status: None   Collection Time: 02/01/15 11:15 AM  Result Value Ref Range Status   Specimen Description TRACHEAL ASPIRATE  Final   Special Requests Normal  Final   Gram Stain   Final    ABUNDANT WBC PRESENT,BOTH PMN AND MONONUCLEAR FEW SQUAMOUS EPITHELIAL CELLS PRESENT NO ORGANISMS SEEN Performed at [facility]    Culture   Final    NORMAL OROPHARYNGEAL FLORA Performed at [facility]    Report Status 02/03/2015 FINAL  Final  Culture, blood (Routine X 2) w Reflex to ID Panel     Status: None   Collection Time: 02/01/15 11:54 AM  Result Value Ref Range Status   Specimen Description BLOOD RIGHT HAND  Final   Special Requests BOTTLES DRAWN AEROBIC AND ANAEROBIC 5CCS  Final   Culture NO GROWTH 5 DAYS  Final   Report Status 02/06/2015 FINAL  Final  Culture, blood (Routine X 2) w Reflex to ID Panel     Status: None   Collection Time: 02/01/15 11:57 AM  Result Value Ref Range Status   Specimen Description BLOOD LEFT HAND  Final   Special Requests BOTTLES DRAWN AEROBIC ONLY 5CCS  Final   Culture NO GROWTH 5 DAYS  Final   Report Status 02/06/2015 FINAL  Final  C difficile quick scan w PCR reflex     Status: Abnormal   Collection Time: 02/05/15  9:45 AM  Result Value Ref Range Status   C Diff antigen POSITIVE (A) NEGATIVE Final   C Diff toxin POSITIVE (A) NEGATIVE Final   C Diff interpretation Positive for toxigenic C. difficile  Final    Comment: CRITICAL RESULT CALLED TO, READ BACK BY AND VERIFIED WITH: SPOKE TO RN A.SMITH @ 8:18 ON 02/06/2015 K.PEELE   Culture, blood (Routine X 2) w Reflex to ID Panel     Status: None (Preliminary result)   Collection  Time: 02/05/15 11:25 AM  Result Value Ref Range Status   Specimen Description BLOOD LEFT HAND  Final   Special Requests IN PEDIATRIC BOTTLE 5CC  Final   Culture NO GROWTH 4 DAYS  Final   Report Status PENDING  Incomplete  Culture, blood (Routine X 2) w Reflex to ID Panel     Status: None (Preliminary result)   Collection Time: 02/05/15 11:30 AM  Result Value Ref Range Status   Specimen Description BLOOD LEFT ANTECUBITAL  Final   Special Requests BOTTLES DRAWN AEROBIC AND ANAEROBIC 5CC  Final   Culture NO GROWTH 4 DAYS  Final   Report Status PENDING  Incomplete  Culture, Urine     Status: None   Collection Time: 02/05/15 11:43 AM  Result Value Ref Range Status   Specimen Description URINE, CATHETERIZED  Final  Special Requests NONE  Final   Culture NO GROWTH 1 DAY  Final   Report Status 02/06/2015 FINAL  Final    Radiology Reports Ct Head Wo Contrast  02/04/2015  CLINICAL DATA:  Followup scan.  EVD clamping for 48 hours. EXAM: CT HEAD WITHOUT CONTRAST TECHNIQUE: Contiguous axial images were obtained from the base of the skull through the vertex without intravenous contrast. COMPARISON:  02/02/2015 FINDINGS: Ventriculostomy catheter position is stable, tip near the foramina [place]. Persistent frothy secretions and mucosal thickening in the sphenoid sinuses in this patient with previous nasal intubation. No new calvarial or orbital findings. 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 There is mild lateral ventriculomegaly, best seen at the temporal horns, which is stable. No third or fourth ventricular enlargement. Scattered subarachnoid hemorrhage which is much improved compared to admission and stable from most recent scan. An ovoid hematoma along the anterior interhemispheric fissure is stable in size and density is fading. Stable low-density in the  bilateral inferior frontal lobes. No evidence of acute infarct. IMPRESSION: 1. Stable mild lateral ventriculomegaly. 2. Stable subarachnoid hemorrhage compared to 2 days ago. 3. Sphenoid sinusitis Electronically Signed   By: Marnee Spring M.D.   On: 02/04/2015 15:47   Ct Head Wo Contrast  02/02/2015  CLINICAL DATA:  Cerebral edema.  Aneurysm coiling. EXAM: CT HEAD WITHOUT CONTRAST TECHNIQUE: Contiguous axial images were obtained from the base of the skull through the vertex without intravenous contrast. 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 Coil remains in good position. Mild diffuse high-density subarachnoid hemorrhage is unchanged. No new hemorrhage. Right frontal ventricular catheter tip at the foramina [place] unchanged. Ventricles are slightly larger compared with the prior study. Negative for acute infarct.  No shift of the midline structures. Air-fluid level in the sphenoid sinus bilaterally similar to the prior study. 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 No new hemorrhage. Ventricular catheter remains in good position. Mild ventricular dilatation compared with the recent CT. Electronically Signed   By: Marlan Palau M.D.   On: 02/02/2015 12:29   Ct Head Wo Contrast  01/28/2015  CLINICAL DATA:  Followup acute subarachnoid hemorrhage and coiling of anterior cerebral artery aneurysm EXAM: CT HEAD WITHOUT CONTRAST TECHNIQUE: Contiguous axial images were obtained from the base of the skull through the vertex without intravenous contrast. COMPARISON:  01/13/2015 FINDINGS: Ventriculostomy from a right frontal approach enters the frontal horn of the right lateral ventricle in has its tip in the region of the foramen of [place]. Ventricles are mildly larger than were seen on the presenting scan. Coils are present at the location of the anterior communicating  artery aneurysm. Anterior interhemispheric blood collide is becoming smaller and less dense. Diffuse subarachnoid hemorrhage is becoming less dense. No definable hyper acute hemorrhage. No convexity subdural or effusion. Layering fluid is present in the sphenoid sinus. No CT evidence of ischemic infarction. IMPRESSION: Previous coiling of anterior communicating artery aneurysm. Right frontal ventriculostomy. Ventricular size is slightly larger than on the presenting examination. Diminishing density of diffuse subarachnoid blood and and interhemispheric hematoma anteriorly. Electronically Signed   By: Paulina Fusi M.D.   On: 01/28/2015 07:30   Ct Head Wo Contrast  01/13/2015  CLINICAL DATA:  Found down outside by neighbors after going to the grocery store, elevated blood pressure at presentation EXAM: CT HEAD  WITHOUT CONTRAST CT CERVICAL SPINE WITHOUT CONTRAST TECHNIQUE: Multidetector CT imaging of the head and cervical spine was performed following the standard protocol without intravenous contrast. Multiplanar CT image reconstructions of the cervical spine were also generated. COMPARISON:  None FINDINGS: CT HEAD FINDINGS Normal ventricular morphology. Extensive high attenuation subarachnoid hemorrhage identified throughout sulci, sylvian fissures, basilar cisterns, and interhemispheric fissure particularly anteriorly. Findings are highly suspicious for ruptured intracranial aneurysm. No intraparenchymal hemorrhage, mass lesion or evidence acute infarction identified. Suspect minimal blood within fourth ventricle. Visualized paranasal sinuses and mastoid air cells clear. Calvarial thickening superolateral LEFT orbit, of uncertain chronicity and etiology. No other osseous abnormalities. CT CERVICAL SPINE FINDINGS Endotracheal tube present anterior to cervical spine. Prevertebral soft tissues normal thickness. Vertebral body heights maintained. Minimal disc space narrowing and endplate spur formation C5-C6. No  fracture, subluxation or bone destruction. Visualized skullbase intact. Interseptal thickening at lung apices. Subarachnoid hemorrhage extends into the upper cervical spinal canal. IMPRESSION: Extensive high attenuation acute subarachnoid hemorrhage throughout basilar cisterns, sylvian fissures, interhemispheric fissure, and BILATERAL cortical sulci high suspicious for ruptured intracranial aneurysm. No acute intraparenchymal brain abnormalities. Minimal degenerative disc disease changes cervical spine. No acute cervical spine abnormalities. Critical Value/emergent results were called by telephone at the time of interpretation on 01/13/2015 at 1131 hr to Dr. Frederick Peers , who verbally acknowledged these results. Electronically Signed   By: Ulyses Southward M.D.   On: 01/13/2015 11:50   Ct Cervical Spine Wo Contrast  01/13/2015  CLINICAL DATA:  Found down outside by neighbors after going to the grocery store, elevated blood pressure at presentation EXAM: CT HEAD WITHOUT CONTRAST CT CERVICAL SPINE WITHOUT CONTRAST TECHNIQUE: Multidetector CT imaging of the head and cervical spine was performed following the standard protocol without intravenous contrast. Multiplanar CT image reconstructions of the cervical spine were also generated. COMPARISON:  None FINDINGS: CT HEAD FINDINGS Normal ventricular morphology. Extensive high attenuation subarachnoid hemorrhage identified throughout sulci, sylvian fissures, basilar cisterns, and interhemispheric fissure particularly anteriorly. Findings are highly suspicious for ruptured intracranial aneurysm. No intraparenchymal hemorrhage, mass lesion or evidence acute infarction identified. Suspect minimal blood within fourth ventricle. Visualized paranasal sinuses and mastoid air cells clear. Calvarial thickening superolateral LEFT orbit, of uncertain chronicity and etiology. No other osseous abnormalities. CT CERVICAL SPINE FINDINGS Endotracheal tube present anterior to cervical  spine. Prevertebral soft tissues normal thickness. Vertebral body heights maintained. Minimal disc space narrowing and endplate spur formation C5-C6. No fracture, subluxation or bone destruction. Visualized skullbase intact. Interseptal thickening at lung apices. Subarachnoid hemorrhage extends into the upper cervical spinal canal. IMPRESSION: Extensive high attenuation acute subarachnoid hemorrhage throughout basilar cisterns, sylvian fissures, interhemispheric fissure, and BILATERAL cortical sulci high suspicious for ruptured intracranial aneurysm. No acute intraparenchymal brain abnormalities. Minimal degenerative disc disease changes cervical spine. No acute cervical spine abnormalities. Critical Value/emergent results were called by telephone at the time of interpretation on 01/13/2015 at 1131 hr to Dr. Frederick Peers , who verbally acknowledged these results. Electronically Signed   By: Ulyses Southward M.D.   On: 01/13/2015 11:50   Dg Chest Port 1 View  02/06/2015  CLINICAL DATA:  Patient with history of fever. EXAM: PORTABLE CHEST 1 VIEW COMPARISON:  Chest radiograph 02/05/2015. FINDINGS: Tracheostomy tube terminates the mid trachea. Multiple monitoring leads overlie the patient. Stable enlarged cardiac and mediastinal contours. Elevation of the right hemidiaphragm. Unchanged heterogeneous opacities left lung base. No pleural effusion or pneumothorax. IMPRESSION: Unchanged retrocardiac opacity which may represent atelectasis or infection. Electronically Signed   By:  Annia Beltrew  Davis M.D.   On: 02/06/2015 08:51   Dg Chest Port 1 View  02/05/2015  CLINICAL DATA:  50 year old female with fever EXAM: PORTABLE CHEST 1 VIEW COMPARISON:  Prior chest x-ray obtained earlier today at 6 a.m. FINDINGS: Tracheostomy tube remains in stable position. Unchanged cardiomegaly. Unchanged bilateral retrocardiac airspace opacities with a linear configuration slightly more prominent on the left than the right remains unchanged. No acute  osseous abnormality. IMPRESSION: No interval change in the appearance of the chest compared to earlier this morning. Persistent left greater than right basilar atelectasis. Electronically Signed   By: Malachy MoanHeath  McCullough M.D.   On: 02/05/2015 08:55   Dg Chest Port 1 View  02/05/2015  CLINICAL DATA:  Respiratory failure EXAM: PORTABLE CHEST 1 VIEW COMPARISON:  02/01/2015 chest radiograph. FINDINGS: Tracheostomy tube tip overlies the tracheal air column 3.5 cm above the carina. Stable cardiomediastinal silhouette with mild cardiomegaly. No pneumothorax. No pleural effusion. Stable mild platelike atelectasis at the left lung base. No pulmonary edema. IMPRESSION: Well-positioned tracheostomy tube. Stable mild cardiomegaly without overt pulmonary edema. Stable mild platelike atelectasis at the left lung base. Electronically Signed   By: Delbert PhenixJason A Poff M.D.   On: 02/05/2015 07:42   Dg Chest Port 1 View  02/01/2015  CLINICAL DATA:  Respiratory failure. EXAM: PORTABLE CHEST 1 VIEW COMPARISON:  January 28, 2015. FINDINGS: Stable cardiomediastinal silhouette. No pneumothorax or pleural effusion is noted. Tracheostomy tube is unchanged in position. Distal tip of feeding tube is seen in expected position of distal stomach. Stable position of right internal jugular catheter line with distal tip in expected position of upper right atrium. Both lungs are clear. The visualized skeletal structures are unremarkable. IMPRESSION: Stable support apparatus. No acute cardiopulmonary abnormality seen. Electronically Signed   By: Lupita RaiderJames  Green Jr, M.D.   On: 02/01/2015 12:21   Dg Chest Port 1 View  01/28/2015  CLINICAL DATA:  Respiratory failure. EXAM: PORTABLE CHEST 1 VIEW COMPARISON:  01/27/2015 FINDINGS: Again noted is a tracheostomy tube. Jugular central line tip is in the upper right atrium. Feeding tube extends into the abdomen. There is mild peribronchial thickening with increased densities at the lung bases. Slightly low lung  volumes. Heart size is stable. IMPRESSION: Low lung volumes with mild peribronchial thickening. Cannot exclude mild interstitial edema. Support apparatuses as described. Electronically Signed   By: Richarda OverlieAdam  Henn M.D.   On: 01/28/2015 08:04   Dg Chest Port 1 View  01/27/2015  CLINICAL DATA:  New tracheostomy EXAM: PORTABLE CHEST 1 VIEW COMPARISON:  Chest radiograph from one day prior. FINDINGS: Tracheostomy tube tip overlies the tracheal air column just below the thoracic inlet. Enteric tube enters the stomach with the tip not seen on this image. Right internal jugular central venous catheter terminates at the cavoatrial junction. Stable cardiomediastinal silhouette with normal heart size. No pneumothorax. No pleural effusion. Stable mild left basilar atelectasis. No pulmonary edema. No new lung opacity. IMPRESSION: 1. Well-positioned tracheostomy tube. 2. Mild left basilar atelectasis. Otherwise no active cardiopulmonary disease. Electronically Signed   By: Delbert PhenixJason A Poff M.D.   On: 01/27/2015 11:33   Dg Chest Port 1 View  01/26/2015  CLINICAL DATA:  Respiratory failure. EXAM: PORTABLE CHEST 1 VIEW COMPARISON:  01/24/2015 FINDINGS: Endotracheal tube is 1.9 cm above the carina. Central line tip in the upper right atrium. Small nodular density in the right upper chest probably related to the ECG lead. Streaky densities in the left lower lung are suggestive for atelectasis. Heart size is  normal. Negative for a pneumothorax. Nasogastric tube extends into the abdomen. IMPRESSION: Few densities at the left lung base are suggestive for atelectasis. Otherwise, no focal airspace disease. Support apparatuses as described. Subtle nodular density in the right upper chest is probably related to the overlying support apparatus as described. Recommend attention to this area on follow up imaging. Electronically Signed   By: Richarda Overlie M.D.   On: 01/26/2015 07:42   Dg Chest Port 1 View  01/24/2015  CLINICAL DATA:   Endotracheal tube placement. EXAM: PORTABLE CHEST 1 VIEW COMPARISON:  01/23/2015 and 01/22/2015. FINDINGS: 0555 hours. The endotracheal tube has been advanced and is now 1 cm above the carina. Right IJ central venous catheter projects to the mid right atrial level. Nasogastric tube projects below the diaphragm, tip not visualized. The heart size and mediastinal contours are stable. There is stable mild atelectasis at both lung bases. No pneumothorax or significant pleural effusion identified. Multiple lines overlie the chest. IMPRESSION: The endotracheal tube is low with its tip within 1 cm of carina. For more optimal positioning, this could be withdrawn 3-4 cm. No other significant changes. Electronically Signed   By: Carey Bullocks M.D.   On: 01/24/2015 08:07   Dg Chest Port 1 View  01/23/2015  CLINICAL DATA:  Evaluate ET tube placement EXAM: PORTABLE CHEST 1 VIEW COMPARISON:  01/22/2015 FINDINGS: The endotracheal tube tip is above the carina. There is a right IJ catheter with tip in the right atrium. Nasogastric tube tip is below the field of view. Normal heart size. Mild pulmonary edema is identified, increased from previous exam. IMPRESSION: 1. Stable support apparatus. 2. Mild pulmonary edema. Electronically Signed   By: Signa Kell M.D.   On: 01/23/2015 09:55   Dg Chest Port 1 View  01/22/2015  CLINICAL DATA:  Central line placement.  Initial encounter. EXAM: PORTABLE CHEST 1 VIEW COMPARISON:  Chest radiograph performed 01/21/2015 FINDINGS: The patient's new right IJ line is noted extending into the right atrium. This could be retracted approximately 5 cm, as deemed clinically appropriate. The endotracheal tube is seen ending 3-4 cm above the carina. A right PICC is noted ending about the proximal SVC. An enteric tube is noted extending below the diaphragm. The lungs are hypoexpanded. Mild left basilar atelectasis is noted. No pleural effusion or pneumothorax is seen. The cardiomediastinal  silhouette is mildly enlarged. No acute osseous abnormalities are identified. IMPRESSION: 1. Right IJ line noted extending into the right atrium. This could be retracted approximately 5 cm to the cavoatrial junction, as deemed clinically appropriate. 2. Lungs hypoexpanded, with mild left basilar atelectasis. 3. Mild cardiomegaly. These results were called by telephone at the time of interpretation on 01/22/2015 at 2:19 am to [facility] on [facility], who verbally acknowledged these results. Electronically Signed   By: Roanna Raider M.D.   On: 01/22/2015 02:19   Dg Chest Port 1 View  01/21/2015  CLINICAL DATA:  Evaluate ET tube placement EXAM: PORTABLE CHEST 1 VIEW COMPARISON:  01/20/2015 FINDINGS: Right arm PICC line tip is in the cavoatrial junction. ET tube tip is above the carina. There is a nasogastric tube with tip in the stomach. Moderate cardiac enlargement. There is no pleural effusion or edema. No airspace consolidation. IMPRESSION: 1. ET tube tip in satisfactory position above the carina. 2. Stable cardiac enlargement. Electronically Signed   By: Signa Kell M.D.   On: 01/21/2015 07:57   Dg Chest Port 1 View  01/20/2015  CLINICAL DATA:  Intubated.  EXAM: PORTABLE CHEST 1 VIEW COMPARISON:  01/19/2015 FINDINGS: The endotracheal tube lies 1 cm above the carina. Right PICC terminates over the cavoatrial junction. Cardiac silhouette remains mildly enlarged. Enteric tube courses into the left upper abdomen with tip not imaged. There is improved aeration of the right lung base. Retrocardiac opacity in the left lung base is similar to the prior study. No sizable pleural effusion or pneumothorax is identified. IMPRESSION: 1. Endotracheal tube 1 cm above the carina. Suggest retracting slightly. 2. Improved aeration of the right lung base. Unchanged left basilar atelectasis. Electronically Signed   By: Sebastian Ache M.D.   On: 01/20/2015 07:51   Dg Chest Port 1 View  01/19/2015  CLINICAL DATA:  Acute  respiratory failure, subarachnoid hemorrhage. EXAM: PORTABLE CHEST 1 VIEW COMPARISON:  Portable chest x-ray of January 17, 2015 FINDINGS: The lungs are adequately inflated. There is subsegmental atelectasis in the retrocardiac region medially on the left and in the right infrahilar region. There is no significant pleural effusion. There is no pneumothorax. The cardiac silhouette is mildly enlarged. The pulmonary vascularity is not engorged. The endotracheal tube tip lies 2.6 cm above the carina. The esophagogastric tube tip projects below the inferior margin of the image. The PICC line tip projects over the midportion of the SVC. IMPRESSION: Mild bibasilar subsegmental atelectasis or infiltrate. Stable cardiomegaly without pulmonary edema. The support tubes are in reasonable position. Electronically Signed   By: David  [place] M.D.   On: 01/19/2015 07:38   Dg Chest Port 1 View  01/17/2015  CLINICAL DATA:  Acute respiratory failure, acute intracranial hemorrhage. EXAM: PORTABLE CHEST 1 VIEW COMPARISON:  Portable chest x-ray of January 16, 2015 FINDINGS: There remains mild hypo inflation predominantly on the right. The pulmonary interstitial markings are minimally prominent though stable. There is no alveolar pneumonia. The endotracheal tube tip is 1.6 cm above the carina. The cardiac silhouette remains enlarged. The pulmonary vascularity is not engorged. The esophagogastric tube tip projects below the inferior margin of the image. The observed bony thorax is unremarkable. IMPRESSION: 1. The endotracheal tube tip lies approximately 1.6 cm above the carina. Withdrawal by 2 cm is recommended. 2. Mild hypo inflation especially on the right. Minimal stable interstitial prominence bilaterally. There is no alveolar pneumonia. Electronically Signed   By: David  [place] M.D.   On: 01/17/2015 08:27   Dg Chest Port 1 View  01/16/2015  CLINICAL DATA:  50 year old female with respiratory failure. EXAM: PORTABLE CHEST 1  VIEW COMPARISON:  01/14/2015 FINDINGS: Cardiomegaly, endotracheal tube with tip 1.8 cm above the carina, left IJ central venous catheter with tip overlying the upper SVC, and NG tube entering the stomach with tip off the field of view again noted. Pulmonary vascular congestion has decreased. Slightly low lung volumes noted with bibasilar atelectasis. There is no evidence of pneumothorax. IMPRESSION: Decreased pulmonary vascular congestion without other significant change. Electronically Signed   By: Harmon Pier M.D.   On: 01/16/2015 08:46   Dg Chest Port 1 View  01/14/2015  CLINICAL DATA:  Respiratory failure. EXAM: PORTABLE CHEST 1 VIEW COMPARISON:  01/13/2015. FINDINGS: Endotracheal tube tip is just above the orifice of the right mainstem bronchus. Proximal retraction of approximately 2 3 cm suggested. Left IJ line and NG tube in stable position. Stable cardiomegaly. Mild bilateral interstitial prominence. Mild congestive heart failure cannot be excluded. Low lung volumes. No pneumothorax. IMPRESSION: 1. Endotracheal tube tip is at the orifice of the right mainstem bronchus. Proximal repositioning of 2 to 3  cm suggested. Remaining lines and tubes in stable position. 2. Cardiomegaly with mild bilateral from interstitial prominence. A mild component of congestive heart failure cannot be excluded. 3. Low lung volumes . Critical Value/emergent results were called by telephone at the time of interpretation on 01/14/2015 at 7:45 am to nurse Suzanna, who verbally acknowledged these results. Electronically Signed   By: Maisie Fus  Register   On: 01/14/2015 07:47   Dg Chest Portable 1 View  01/13/2015  CLINICAL DATA:  Central catheter placement EXAM: PORTABLE CHEST 1 VIEW COMPARISON:  Study obtained earlier in the day FINDINGS: Central catheter tip is in the superior vena cava. Nasogastric tube tip and side port are below the diaphragm. Endotracheal tube tip is 2.3 cm above the carina. No pneumothorax. There is mild  bibasilar atelectasis. Lungs elsewhere clear. Heart is mildly enlarged with pulmonary vascularity within normal limits. No adenopathy. IMPRESSION: Tube and catheter positions as described without pneumothorax. Mild bibasilar atelectasis. Lungs elsewhere clear. No change in cardiac silhouette. Electronically Signed   By: Bretta Bang III M.D.   On: 01/13/2015 13:36   Dg Chest Portable 1 View  01/13/2015  CLINICAL DATA:  Check endotracheal tube placement EXAM: PORTABLE CHEST - 1 VIEW COMPARISON:  08/30/2005 FINDINGS: Cardiac shadow remains enlarged. An endotracheal tube is noted 4 cm above the carina. The lungs are well aerated bilaterally with mild peribronchial cuffing bilaterally which may represent bronchitis. No focal confluent infiltrate is seen. IMPRESSION: Endotracheal tube in satisfactory position. Mild central peribronchial cuffing Electronically Signed   By: Alcide Clever M.D.   On: 01/13/2015 12:02   Dg Abd Portable 1v  02/09/2015  CLINICAL DATA:  New onset nausea. EXAM: PORTABLE ABDOMEN - 1 VIEW COMPARISON:  01/27/2015 FINDINGS: There is a gastrostomy tube present. There is no bowel dilatation to suggest obstruction. There is no evidence of pneumoperitoneum, portal venous gas or pneumatosis. There are no pathologic calcifications along the expected course of the ureters. The osseous structures are unremarkable. IMPRESSION: Negative. Electronically Signed   By: Elige Ko   On: 02/09/2015 08:17   Dg Abd Portable 1v  01/27/2015  CLINICAL DATA:  Patient status post feeding tube placement. EXAM: PORTABLE ABDOMEN - 1 VIEW COMPARISON:  Abdominal radiograph 01/13/2015. FINDINGS: Feeding tube is present with tip projecting at the gastric antrum. Gas is demonstrated throughout the colon. No definite evidence for free intraperitoneal air. Cardiomegaly. Focal consolidative opacity within the left lung base. IMPRESSION: Feeding tube tip projects at the gastric antrum. Focal nodular consolidative  opacity within the left lung base potentially secondary to an infectious process or atelectasis. Continued radiographic follow-up is recommended to ensure resolution. Electronically Signed   By: Annia Belt M.D.   On: 01/27/2015 11:39   Dg Abd Portable 1v  01/13/2015  CLINICAL DATA:  OG tube placement. EXAM: PORTABLE ABDOMEN - 1 VIEW COMPARISON:  08/30/2005 FINDINGS: Enteric tube is present with tip in the right mid abdomen consistent with location in the second-third portion of the duodenum. Evaluation of the abdomen is limited due to motion artifact but there is no gross evidence of any gaseous distention of bowel. IMPRESSION: Enteric tube tip is in the right mid abdomen consistent with location in the second-third portion of the duodenum. Electronically Signed   By: Burman Nieves M.D.   On: 01/13/2015 22:48     CBC  Recent Labs Lab 02/04/15 0542 02/05/15 0233 02/06/15 0442 02/08/15 0508 02/10/15 0603  WBC 11.3* 15.4* 27.0* 15.1* 6.2  HGB 10.5* 10.9* 11.1* 9.5*  9.9*  HCT 32.4* 34.0* 32.3* 29.4* 31.1*  PLT 337 283 367 289 272  MCV 96.1 95.8 95.8 96.4 97.8  MCH 31.2 30.7 32.9 31.1 31.1  MCHC 32.4 32.1 34.4 32.3 31.8  RDW 15.7* 15.5 16.6* 16.0* 16.1*  LYMPHSABS 2.3 1.7 1.4  --   --   MONOABS 0.7 1.0 1.4*  --   --   EOSABS 0.2 0.0 0.0  --   --   BASOSABS 0.0 0.0 0.0  --   --     Chemistries   Recent Labs Lab 02/04/15 0542 02/05/15 0233 02/06/15 0442 02/08/15 0508 02/10/15 0603  NA 142 139 142 149* 146*  K 3.7 4.1 3.8 3.7 3.8  CL 111 106 115* 119* 112*  CO2 22 23 18* 22 25  GLUCOSE 143* 139* 182* 129* 182*  BUN 21* 17 18 20  22*  CREATININE 0.55 0.54 0.54 0.53 0.54  CALCIUM 9.2 9.3 9.3 8.9 9.1  MG 1.9 1.8 1.9  --  2.0  AST  --   --   --   --  62*  ALT  --   --   --   --  134*  ALKPHOS  --   --   --   --  136*  BILITOT  --   --   --   --  1.2    ------------------------------------------------------------------------------------------------------------------ estimated creatinine clearance is 81.6 mL/min (by C-G formula based on Cr of 0.54). ------------------------------------------------------------------------------------------------------------------  Recent Labs  02/08/15 0508  HGBA1C 7.1*   ------------------------------------------------------------------------------------------------------------------ No results for input(s): CHOL, HDL, LDLCALC, TRIG, CHOLHDL, LDLDIRECT in the last 72 hours. ------------------------------------------------------------------------------------------------------------------ No results for input(s): TSH, T4TOTAL, T3FREE, THYROIDAB in the last 72 hours.  Invalid input(s): FREET3 ------------------------------------------------------------------------------------------------------------------ No results for input(s): VITAMINB12, FOLATE, FERRITIN, TIBC, IRON, RETICCTPCT in the last 72 hours.  Coagulation profile No results for input(s): INR, PROTIME in the last 168 hours.  No results for input(s): DDIMER in the last 72 hours.  Cardiac Enzymes  Recent Labs Lab 02/04/15 0542  CKMB 5.4*   ------------------------------------------------------------------------------------------------------------------ Invalid input(s): POCBNP   Time Spent in minutes   35   Suhailah Kwan K M.D on 02/10/2015 at 10:45 AM  Between 7am to 7pm - Pager - [phone]  After 7pm go to [email or web address] - password [facility]  [facility] -  Office  [phone]

## 2015-02-10 NOTE — Progress Notes (Signed)
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  pt with Seizure activity per EMS, intubated 12/15 - 12/19, and now post Trach and Peg.  pt with hx of HTN, Etoh, and Medical Non-compliance.   OT comments  Pt visually tracking therapist into R visual field past midline and able to sustain gaze @ 3-5 seconds. Pt able to sustain gaze briefly on TV at midline before returning to L gaze. Inconsistently following commands with LUE. Recommend nsg keep RUE supported on 2 pillows due to subluxed R shoulder. Continue to recommend rehab at [facility].   Follow Up Recommendations  SNF;Supervision/Assistance - 24 hour    Equipment Recommendations  None recommended by OT    Recommendations for Other Services      Precautions / Restrictions Precautions Precautions: Fall              ADL                                         General ADL Comments: total A with all ADL tasks. Pt held wash cloth and attmepted to wipe mouth once placed toward mouthj      Vision  Able to track therapist into R visual field and maintain gaze at midline. Looking at TV which was positionined in R visual field.                    Perception     Praxis      Cognition   Behavior During Therapy: Flat affect Overall Cognitive Status: Impaired/Different from baseline Area of Impairment: Attention;Following commands   Current Attention Level: Focused    Following Commands: Follows one step commands inconsistently       General Comments: Would squeeze L hand after delay of 5 seoncds. would follow command to look at therapist.     Extremity/Trunk Assessment               Exercises Other Exercises Other Exercises: PROM of the neck  to the right for stretch Other Exercises: RUE PROM - supported on 2 pillows due to subluxed shoulder Other Exercises: LUE AAROM all planes   Shoulder Instructions       General Comments      Pertinent Vitals/ Pain       Pain Assessment: Faces Faces Pain Scale: Hurts little more Pain Location: with cervical ROM  Home Living                                          Prior Functioning/Environment              Frequency Min 2X/week     Progress Toward Goals  OT Goals(current goals can now be found in the care plan section)  Progress towards OT goals: Progressing toward goals  Acute Rehab OT Goals Patient Stated Goal: pt unable to state. OT Goal Formulation: Patient unable to participate in goal setting Potential to Achieve Goals: Good ADL Goals Additional ADL Goal #1: Pt will follow one step commands at least 50% of the time.  Additional  ADL Goal #2: Pt will consistently track therapist/items into Rt visual field with min cues  Additional ADL Goal #3: Family will be independent with PROM Rt UE  Additional ADL Goal #4: Pt will wash face with min A   Plan Discharge plan remains appropriate    Co-evaluation                 End of Session Equipment Utilized During Treatment: Oxygen;Other (comment) (TC/peg)   Activity Tolerance Patient limited by lethargy   Patient Left in bed;with call bell/phone within reach;with bed alarm set   Nurse Communication Mobility status        Time: 1520-1535 OT Time Calculation (min): 15 min  Charges: OT General Charges $OT Visit: 1 Procedure OT Treatments $Therapeutic Activity: 8-22 mins  Kord Monette,HILLARY 02/10/2015, 3:40 PM   [facility] Mikell Camp, OTR/L  [phone] 02/10/2015

## 2015-02-10 NOTE — Progress Notes (Signed)
Pt tube feeding Jevity 1.2  on hold at this time per ultrasound department for test due for midnight.

## 2015-02-10 NOTE — Care Management Note (Signed)
Case Management Note  Patient Details  Name: Rachel Vang MRN: [id number] Date of Birth: 10/23/1965  Subjective/Objective:                    Action/Plan: Plan is SNF at discharge. CM will continue to follow for discharge needs.  Expected Discharge Date:  02/18/15               Expected Discharge Plan:  Skilled Nursing Facility  In-House Referral:  Clinical Social Work  Discharge planning Services  CM Consult  Post Acute Care Choice:    Choice offered to:     DME Arranged:    DME Agency:     HH Arranged:    HH Agency:     Status of Service:  In process, will continue to follow  Medicare Important Message Given:    Date Medicare IM Given:    Medicare IM give by:    Date Additional Medicare IM Given:    Additional Medicare Important Message give by:     If discussed at Long Length of Stay Meetings, dates discussed:    Additional Comments:  Rachel BaloKelli F [name], RN 02/10/2015, 2:30 PM

## 2015-02-10 NOTE — Progress Notes (Signed)
This CSW has communicated with [facility] and awaiting their review and decision.  This CSW rec'd call from son, Rachel Vang, wanting to hear from Doctor which I was able to arrange earlier today.  CSW has stressed to family that patient will need to be released to SNF where we are able to secure a bed- the options are limited due to her Medicaid Pending status. Awaiting further word on SNF beds.    Rachel Vang, MSW, Theresia MajorsLCSWA [phone]

## 2015-02-10 NOTE — Progress Notes (Signed)
CSW has updated patient's son  Jeannett SeniorStephen that we have an offer for SNF bed tomorrow at [facility]; which he states,  "I don't even know where that is".  Patient's son [facility] also [facility] who has offered a bed for Monday- both beds are LOG's with the Medicaid pending. Son requesting that she remain in the hospital until Monday and states "we are willing to pay for her to stay".  CSW has placed a call to Medical Director for further input on disposition and awaiting callback. CSW covering tomorrow to follow up for further dc planning.  Reece LevyJanet Westly Hinnant, MSW, Theresia MajorsLCSWA  [phone]

## 2015-02-10 NOTE — Progress Notes (Signed)
PULMONARY / CRITICAL CARE MEDICINE   Name: Rachel Vang MRN: [id number] DOB: Mar 12, 1965    ADMISSION DATE:  01/13/2015  REFERRING MD:  EDP  CHIEF COMPLAINT:  Altered mental status  LINES/TUBES: 12/15 ETT >> 12/29 12/15 Rt IJ CVL >>12/26 12/15 IVC >> 1/6 12/18 R PICC >> 12/23 12/23 R IJ TLC>>>plan 1/3  Trach [place]) 12/29 >> PEG (JW) 1/4>> Foley 1/3>> PIV x1  STUDIES: 12/15 CT head >> [facility] 12/16 Echo >> severe LVH, EF 60 to 65%, grade 1 diastolic dysfx  SIGNIFICANT EVENTS: 12/15 Admit, SAH >> coiling distal ACA aneurysm, IVC drain placed 12/29 Trach (JY) >> 12/30 Fever 01/03 Fever >> lines changed 01/07 C diff positive 01/09 To telemetry  SUBJECTIVE: Respiratory therapist reports patient has been without humidified oxygen for some time. She has noted increased thick secretions. No other acute events noted.  REVIEW OF SYSTEMS: Unable to obtain given altered mentation.  VITAL SIGNS: BP 153/89 mmHg  Pulse 100  Temp(Src) 99.3 F (37.4 C) (Axillary)  Resp 20  Ht 5\' 5"  (1.651 m)  Wt 153 lb 14.1 oz (69.8 kg)  BMI 25.61 kg/m2  SpO2 100%  LMP  (LMP Unknown)  INTAKE / OUTPUT: I/O last 3 completed shifts: In: 350 [Other:350] Out: 2500 [Urine:2000; Stool:500]  PHYSICAL EXAMINATION: General: Eyes open. No distress. Laying in bed. Integument:  Warm & dry. No rash on exposed skin.  HEENT:  Tracheostomy in place with #8 Shiley cuffed. No scleral injection. Moist mucous membranes. Cardiovascular:  Regular rate. Normal S1 & S2. No appreciable JVD.  Pulmonary:  Clear bilaterally to auscultation anteriorly. Symmetric chest wall rise on tracheostomy collar. Abdomen: Soft. Normal bowel sounds. PEG tube in place.   CBC Recent Labs     02/08/15  0508  02/10/15  0603  WBC  15.1*  6.2  HGB  9.5*  9.9*  HCT  29.4*  31.1*  PLT  289  272    BMET Recent Labs     02/08/15  0508  02/10/15  0603  NA  149*  146*  K  3.7  3.8  CL  119*  112*  CO2  22  25  BUN  20   22*  CREATININE  0.53  0.54  GLUCOSE  129*  182*    Electrolytes Recent Labs     02/08/15  0508  02/10/15  0603  CALCIUM  8.9  9.1  MG   --   2.0    Sepsis Markers No results for input(s): PROCALCITON, O2SATVEN in the last 72 hours.  Invalid input(s): LACTICACIDVEN  Glucose Recent Labs     02/09/15  1223  02/09/15  1623  02/09/15  2123  02/10/15  0004  02/10/15  0416  02/10/15  0736  GLUCAP  122*  131*  177*  142*  151*  163*    Imaging Dg Abd Portable 1v  02/09/2015  CLINICAL DATA:  New onset nausea. EXAM: PORTABLE ABDOMEN - 1 VIEW COMPARISON:  01/27/2015 FINDINGS: There is a gastrostomy tube present. There is no bowel dilatation to suggest obstruction. There is no evidence of pneumoperitoneum, portal venous gas or pneumatosis. There are no pathologic calcifications along the expected course of the ureters. The osseous structures are unremarkable. IMPRESSION: Negative. Electronically Signed   By: Elige Ko   On: 02/09/2015 08:17    ASSESSMENT / PLAN:  50 yo female smoker presented with altered mental status, Rt pupil dilation from HTN emergency (BP 245/194) and SAH. Patient has tolerated  transition to tracheostomy collar after percutaneous tracheostomy was placed on 12/29. Suspect secretions are due to lack of humidified oxygen. No signs of infection with fever or leukocytosis.  1. Compromised Airway:  Secondary to acute encephalopathy. S/P Tracheostomy & PEG placement. Spoke with nurse to request Shiley #6 cuffed tracheostomy be brought to bedside for trach change. 2. 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 His secretions due to improve with humidification recommend repeat chest x-ray imaging. 3. SAH:  S/P Coiling. Care per Neurosurgery.  Remainder of patient's care per primary service.  Donna ChristenJennings E. Jamison NeighborNestor, M.D. [facility] Pager:  [phone] After 3pm or if no response, call [phone]  02/10/2015, 8:12  AM

## 2015-02-11 ENCOUNTER — Inpatient Hospital Stay (HOSPITAL_COMMUNITY): Payer: Medicaid Other

## 2015-02-11 LAB — COMPREHENSIVE METABOLIC PANEL
ALT: 106 U/L — AB (ref 14–54)
ANION GAP: 8 (ref 5–15)
AST: 34 U/L (ref 15–41)
Albumin: 2.3 g/dL — ABNORMAL LOW (ref 3.5–5.0)
Alkaline Phosphatase: 124 U/L (ref 38–126)
BUN: 21 mg/dL — ABNORMAL HIGH (ref 6–20)
CHLORIDE: 109 mmol/L (ref 101–111)
CO2: 27 mmol/L (ref 22–32)
CREATININE: 0.58 mg/dL (ref 0.44–1.00)
Calcium: 9.2 mg/dL (ref 8.9–10.3)
Glucose, Bld: 154 mg/dL — ABNORMAL HIGH (ref 65–99)
POTASSIUM: 3.7 mmol/L (ref 3.5–5.1)
SODIUM: 144 mmol/L (ref 135–145)
Total Bilirubin: 0.5 mg/dL (ref 0.3–1.2)
Total Protein: 6.1 g/dL — ABNORMAL LOW (ref 6.5–8.1)

## 2015-02-11 LAB — GLUCOSE, CAPILLARY
GLUCOSE-CAPILLARY: 130 mg/dL — AB (ref 65–99)
GLUCOSE-CAPILLARY: 135 mg/dL — AB (ref 65–99)
GLUCOSE-CAPILLARY: 151 mg/dL — AB (ref 65–99)
Glucose-Capillary: 143 mg/dL — ABNORMAL HIGH (ref 65–99)
Glucose-Capillary: 136 mg/dL — ABNORMAL HIGH (ref 65–99)
Glucose-Capillary: 124 mg/dL — ABNORMAL HIGH (ref 65–99)
Glucose-Capillary: 139 mg/dL — ABNORMAL HIGH (ref 65–99)
Glucose-Capillary: 139 mg/dL — ABNORMAL HIGH (ref 65–99)
Glucose-Capillary: 147 mg/dL — ABNORMAL HIGH (ref 65–99)

## 2015-02-11 MED ORDER — PRO-STAT SUGAR FREE PO LIQD: 30.0000 mL | Freq: Two times a day (BID) | ORAL | Status: DC

## 2015-02-11 MED ORDER — INSULIN GLARGINE 100 UNIT/ML ~~LOC~~ SOLN: 10.0000 [IU] | Freq: Two times a day (BID) | SUBCUTANEOUS | Status: DC

## 2015-02-11 MED ORDER — JEVITY 1.2 CAL PO LIQD: 1000.0000 mL | ORAL | Status: DC

## 2015-02-11 MED ORDER — INSULIN ASPART 100 UNIT/ML ~~LOC~~ SOLN
0.0000 [IU] | SUBCUTANEOUS | Status: DC
Start: 2015-02-11 — End: 2015-02-22

## 2015-02-11 MED ORDER — SIMVASTATIN 20 MG PO TABS: 20.0000 mg | ORAL_TABLET | Freq: Every day | ORAL | Status: DC

## 2015-02-11 MED ORDER — LEVETIRACETAM 100 MG/ML PO SOLN: 500.0000 mg | Freq: Two times a day (BID) | ORAL | Status: DC

## 2015-02-11 MED ORDER — CETYLPYRIDINIUM CHLORIDE 0.05 % MT LIQD: 7.0000 mL | Freq: Two times a day (BID) | OROMUCOSAL | Status: DC

## 2015-02-11 MED ORDER — VANCOMYCIN 50 MG/ML ORAL SOLUTION: 500.0000 mg | Freq: Four times a day (QID) | ORAL | Status: DC

## 2015-02-11 MED ORDER — LISINOPRIL 10 MG PO TABS: 10.0000 mg | ORAL_TABLET | Freq: Every day | ORAL | Status: DC

## 2015-02-11 MED ORDER — BETHANECHOL CHLORIDE 10 MG PO TABS: 10.0000 mg | ORAL_TABLET | Freq: Three times a day (TID) | ORAL | Status: DC

## 2015-02-11 MED ORDER — METOPROLOL TARTRATE 25 MG PO TABS: 125.0000 mg | ORAL_TABLET | Freq: Two times a day (BID) | ORAL | Status: DC

## 2015-02-11 MED ORDER — AMLODIPINE BESYLATE 10 MG PO TABS: 10.0000 mg | ORAL_TABLET | Freq: Every day | ORAL | Status: DC

## 2015-02-11 MED ORDER — FAMOTIDINE 40 MG/5ML PO SUSR: 20.0000 mg | Freq: Two times a day (BID) | ORAL | Status: DC

## 2015-02-11 MED ORDER — CLONIDINE HCL 0.1 MG PO TABS: 0.1000 mg | ORAL_TABLET | Freq: Three times a day (TID) | ORAL | Status: DC

## 2015-02-11 MED ORDER — FENTANYL 25 MCG/HR TD PT72: 25.0000 ug | MEDICATED_PATCH | TRANSDERMAL | Status: DC

## 2015-02-11 NOTE — Discharge Instructions (Signed)
Follow with SNF MD in 3-4 days days   Get CBC, CMP, 2 view Chest X ray checked  by SNF MD in 3-4 days    Activity: As tolerated with Full fall precautions use walker/cane & assistance as needed   Disposition SNF   Diet:   All medications and tube feeds via PEG tube nothing by mouth by mouth.  For Heart failure patients - Check your Weight same time everyday, if you gain over 2 pounds, or you develop in leg swelling, experience more shortness of breath or chest pain, call your Primary MD immediately. Follow Cardiac Low Salt Diet and 1.5 lit/day fluid restriction.   On your next visit with your primary care physician please Get Medicines reviewed and adjusted.   Please request your Prim.MD to go over all Hospital Tests and Procedure/Radiological results at the follow up, please get all Hospital records sent to your Prim MD by signing hospital release before you go home.   If you experience worsening of your admission symptoms, develop shortness of breath, life threatening emergency, suicidal or homicidal thoughts you must seek medical attention immediately by calling 911 or calling your MD immediately  if symptoms less severe.  You Must read complete instructions/literature along with all the possible adverse reactions/side effects for all the Medicines you take and that have been prescribed to you. Take any new Medicines after you have completely understood and accpet all the possible adverse reactions/side effects.   Do not drive, operating heavy machinery, perform activities at heights, swimming or participation in water activities or provide baby sitting services if your were admitted for syncope or siezures until you have seen by Primary MD or a Neurologist and advised to do so again.  Do not drive when taking Pain medications.    Do not take more than prescribed Pain, Sleep and Anxiety Medications  Special Instructions: If you have smoked or chewed Tobacco  in the last 2 yrs  please stop smoking, stop any regular Alcohol  and or any Recreational drug use.  Wear Seat belts while driving.   Please note  You were cared for by a hospitalist during your hospital stay. If you have any questions about your discharge medications or the care you received while you were in the hospital after you are discharged, you can call the unit and asked to speak with the hospitalist on call if the hospitalist that took care of you is not available. Once you are discharged, your primary care physician will handle any further medical issues. Please note that NO REFILLS for any discharge medications will be authorized once you are discharged, as it is imperative that you return to your primary care physician (or establish a relationship with a primary care physician if you do not have one) for your aftercare needs so that they can reassess your need for medications and monitor your lab values.

## 2015-02-11 NOTE — Progress Notes (Signed)
Pt has not voided in 6 hours. Bladder scanned paged Md. Spoke with MD received verbal order to monitor pt, bladder scan q4hrs, straight cath if more than . Abdomen soft. Pt no response to touch. Will continue to monitor.

## 2015-02-11 NOTE — Progress Notes (Signed)
CSW contacted Pt's son Delman Cheadle([name] Ide  [phone]) to discuss discharge plans for Pt transferring to [facility].   Pt's son was pleasant at first to speak with, however Pt's son's tone became stern and he was firmly denying the Pt's discharge to accepted facility. CSW explained to the Pt's son Jeannett SeniorStephen that this facility is the only facility who an accept Pt today with a LOG. Pt's son discussed with CSW about feeling like the d/c process was moving "too quickly and yaw didn't give us time to make a decision." CSW explained the discharge process and reasons why Pt was difficult to place. CSW explained that since Pt does not have insurance and care for Pt will be completed be Pt's current condition, [facility] will be the best qualified facility with an opening today. CSW emphasized with Pt's son that cost of facility will be covered by the hospital and that it would give the family time to make permanent arrangements if need be.   CSW explained everything to other CSW Minerva Areola(Eric) who will go speak with the family at the bedside.    Lenord CarboCassandra Lilee Aldea LCSWA  [facility]  [phone]

## 2015-02-11 NOTE — Progress Notes (Signed)
Foley removed per MD order.  

## 2015-02-11 NOTE — Progress Notes (Signed)
Spoke with Trey PaulaJeff with Respiratory to notify of new order for 6 cuffless trach to be placed prior to pt tranfer to SNF. SW notified.

## 2015-02-11 NOTE — Clinical Social Work Note (Addendum)
CSW received phone call from Gavin PoundDeborah at [facility], who said patient's son Delman CheadleStephen Nisley who is not agreeing to complete paperwork for admission to facility.  CSW attempted to call patient's son at [phone] to discuss if there is anyone else who can fill out paperwork or if paperwork can be faxed to him, CSW left a message, awaiting call back.  CSW updated clinical social work Chiropodistassistant director Zack Shon BatonBrooks [phone] who had spoken to patient's son earlier in the day about filling out paperwork as well.  CSW to continue to follow patient's progress.  5:15pm CSW spoke to clinical social work Building surveyorassistant director Zack Brooks, who is going to contact admissions Interior and spatial designerdirector of [facility] on Saturday morning to see if there will be a bed available over the weekend and update CSW.  [facility] had informed previous CSW and patient's family that [facility].  CSW was informed by previous Child psychotherapistsocial worker who spoke with patient's son Delman CheadleStephen Appleby who lives out of the area and is in the [employer] that Patient does not have anyone who can provide 24 hour care for patient at home.  CSW to continue to follow patient's progress.  Ervin KnackEric R. Serafin Decatur, MSW, Theresia MajorsLCSWA [phone] 02/11/2015 4:12 PM

## 2015-02-11 NOTE — Procedures (Signed)
Tracheostomy Change Note  Patient Details:   Name: Rachel MinksStephanie N Vang DOB: 05/04/1965 MRN: [id number]    Airway Documentation:     Evaluation  O2 sats: stable throughout Complications: No apparent complications Patient did tolerate procedure well. Bilateral Breath Sounds: Rhonchi Suctioning: Airway  PT sats are stable [name] is secure   [name], Duane LopeJeffrey D 02/11/2015, 12:17 PM

## 2015-02-11 NOTE — Progress Notes (Signed)
Pt to be discharged to SNF today. Instructed by Respiratory to page Md to get an order placed to have 6 cuffed trach to be changed to 6 cuffless trach prior to d/c. Spoke to Md.

## 2015-02-11 NOTE — Discharge Summary (Addendum)
Rachel Vang, is a 50 y.o. female  DOB 07-13-1965  MRN [id number].  Admission date:  01/13/2015  Admitting Physician  Alyson Reedy, MD  Discharge Date:  02/14/2015   Primary MD  No primary care provider on file.  Recommendations for primary care physician for things to follow:   Check CBC, CMP, 2 view chest x-ray within a week.  She remains at extremely high risk for aspiration pneumonia, developing decubitus ulcer, urinary retention/UTI.  Outpatient neurosurgery and urology follow-up.  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 Continue routene daily Trach and PEG site care.  Free water 250cc VIA PEG q6hrs.   Admission Diagnosis  Subarachnoid hemorrhage (HCC) [I60.9] SAH (subarachnoid hemorrhage) (HCC) [I60.9] Acute respiratory failure with hypoxia and hypercapnia (HCC) [J96.01, J96.02]   Discharge Diagnosis  Subarachnoid hemorrhage (HCC) [I60.9] SAH (subarachnoid hemorrhage) (HCC) [I60.9] Acute respiratory failure with hypoxia and hypercapnia (HCC) [J96.01, J96.02]     Active Problems:   ICH (intracerebral hemorrhage) (HCC)   Subarachnoid hemorrhage (HCC)   Acute respiratory failure with hypoxia and hypercapnia (HCC)   Encounter for orogastric (OG) tube placement   SAH (subarachnoid hemorrhage) (HCC)   Central line complication   Encounter for central line care   Encounter for feeding tube placement   Neurological abnormality   Cerebral edema (HCC)   Respiratory failure (HCC)   Tracheostomy in place [facility])   History of ETT   Hydrocephalus      Past Medical History  Diagnosis Date  . Hypertension     Past Surgical History  Procedure Laterality Date  . Abdominal surgery    . Radiology with anesthesia N/A 01/13/2015    Procedure: RADIOLOGY  WITH ANESTHESIA;  Surgeon: Lisbeth Renshaw, MD;  Location: [facility] OR;  Service: Radiology;  Laterality: N/A;  . Esophagogastroduodenoscopy (egd) with propofol N/A 02/02/2015    Procedure: ESOPHAGOGASTRODUODENOSCOPY (EGD) WITH PROPOFOL;  Surgeon: Jimmye Norman, MD;  Location: [facility] ENDOSCOPY;  Service: General;  Laterality: N/A;  . Peg placement N/A 02/02/2015    Procedure: PERCUTANEOUS ENDOSCOPIC GASTROSTOMY (PEG) PLACEMENT;  Surgeon: Jimmye Norman, MD;  Location: [facility] ENDOSCOPY;  Service: General;  Laterality: N/A;       HPI  from the history and physical done on the day of admission:   50yr old AA female with H/O HTN who was noncompliant with her medications, one pack-a-day smoker, daily beer drinker, who was shopping at [employer] and fell down, when she was brought to the ER she was found to have right-sided pupil dilation, in the ER workup was consistent with intracranial bleed with suspected aneurysm, she was intubated, she was noted to the ICU, she was seen by trauma and neurosurgery. 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 She was subsequently treated, PEG tube was placed. During her hospital stay she developed C. difficile colitis, influenza infection and Escherichia coli UTI.  She remains unresponsive and only moves her extremities to  painful stimuli, she was transferred out of ICU under hospitalist service on 02/08/2015 on day 26 of her hospital stay.     Hospital Course:     1. Subarachnoid bleed. Status post ACA aneurysm coiling and IVC drain placement (now out) . She remains minimally responsive and moves only to painful stimuli, she has trach and PEG. She is currently on pneumonia pain which will be stopped since 21 days are over, Decadron 1 mg IV twice a day which will be stopped, continue Keppra along with statin. I discussed this with neurosurgeon on call Dr. Conchita Paris on 02/08/2015. Continue supportive care will require  placement.  Unfortunately she remains at very high risk for developing aspiration pneumonia, decubitus ulcer, urinary retention with UTI, unfortunately her mental status is not likely to improve much and neither is her quality of life. If declines further I have strongly encouraged family to focus on comfort care.   2. Dysphagia - Unable to maintain Airway , low functional state and extreme deconditioning- due to #1 above. Supportive care. Has tracheostomy with trach collar, PEG tube for tube feeds.   3. C. difficile colitis. As placed on oral vancomycin via PEG tube, diarrhea has completely resolved, now constipated and no BM since 02/11/2015. Stop vancomycin. Place on Colace.   4. Essential hypertension. In poor control. Placed on Lopressor, ACE, clonidine and Norvasc along with as needed IV Lopressor and monitor.   5. Irritable bladder. Continue bethanechol.   6.Elevated troponin on admission - Likely demand ischemia (normal echo 01/14/15). On beta blocker and statin, cannot use aspirin due to bleed. Currently not a candidate for further workup.   7. Possible seizure prior to admission. On Keppra continue.    8. Haemophilus influenza tracheobronchitis, Escherichia coli UTI. Has finished treatment for both.   9. Mildly elevated liver enzymes. Patient unable to answer whether she has abdominal pain, right upper quadrant is soft without any rebound or guarding, and is improving, stable right upper quadrant ultrasound. Recheck CMP in a week.    10. Undiagnosed DM2 - SSI + Lantus, continue to monitor CBGs every 4 hours.   11. Urinary retention. Failed Foley catheter removal trial, placed on Flomax, Foley replaced. Outpatient urology follow-up in 1-2 weeks.   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 Continue routene daily Trach and PEG site care.    Family Communication :   Called and left a detailed message on son's Mr. Belma Dyches cell phone on 02/09/2015 phone number [phone].  I called again and talked to the son on 02/10/2015. He was complaining that so far no physician has called him to update and her mother has been admitted for 28 days, I informed him that I resumed his mother's care 2 days ago and I have already called them twice.   He also complains that he was never told when his mother will be moving out of the ICU. I requested him to get in touch with the ICU team to clarify this.  Further says that he wants his mother to stay in this hospital as he would have hard time commuting to rehabilitation facilities outside [place] as he lives in [place]. I told him to get in touch with social worker for placement issues.   I informed him in detail that his mother is not acutely sick however her long-term prognosis is not very good due to a near nonfunctional state, being bedbound, being fed through tube feeds,  she remains high risk for aspiration and decubitus ulcer along with UTI and other potential complications in the future.   Discharge Condition: Guarded  Follow UP  Follow-up Information    Follow up with [facility], NEELESH, C, MD. Schedule an appointment as soon as possible for a visit in 1 week.   Specialty:  Neurosurgery   Contact information:   [address] [place] [address] [phone]        Consults obtained -  N.Surg, PCCM, Trauma  Diet and Activity recommendation: See Discharge Instructions below  Discharge Instructions           Discharge Instructions    Discharge instructions    Complete by:  As directed   Follow with SNF MD in 3-4 days days   Get CBC, CMP, 2 view Chest X ray checked  by SNF MD in 3-4 days    Activity: As tolerated with Full fall precautions use walker/cane & assistance as needed   Disposition SNF   Diet:   All medications and tube feeds via PEG tube nothing by mouth by mouth.  For Heart failure patients - Check  your Weight same time everyday, if you gain over 2 pounds, or you develop in leg swelling, experience more shortness of breath or chest pain, call your Primary MD immediately. Follow Cardiac Low Salt Diet and 1.5 lit/day fluid restriction.   On your next visit with your primary care physician please Get Medicines reviewed and adjusted.   Please request your Prim.MD to go over all Hospital Tests and Procedure/Radiological results at the follow up, please get all Hospital records sent to your Prim MD by signing hospital release before you go home.   If you experience worsening of your admission symptoms, develop shortness of breath, life threatening emergency, suicidal or homicidal thoughts you must seek medical attention immediately by calling 911 or calling your MD immediately  if symptoms less severe.  You Must read complete instructions/literature along with all the possible adverse reactions/side effects for all the Medicines you take and that have been prescribed to you. Take any new Medicines after you have completely understood and accpet all the possible adverse reactions/side effects.   Do not drive, operating heavy machinery, perform activities at heights, swimming or participation in water activities or provide baby sitting services if your were admitted for syncope or siezures until you have seen by Primary MD or a Neurologist and advised to do so again.  Do not drive when taking Pain medications.    Do not take more than prescribed Pain, Sleep and Anxiety Medications  Special Instructions: If you have smoked or chewed Tobacco  in the last 2 yrs please stop smoking, stop any regular Alcohol  and or any Recreational drug use.  Wear Seat belts while driving.   Please note  You were cared for by a hospitalist during your hospital stay. If you have any questions about your discharge medications or the care you received while you were in the hospital after you are discharged, you can  call the unit and asked to speak with the hospitalist on call if the hospitalist that took care of you is not available. Once you are discharged, your primary care physician will handle any further medical issues. Please note that NO REFILLS for any discharge medications will be authorized once you are discharged, as it is imperative that you return to your primary care physician (or establish a relationship with a primary care physician if  you do not have one) for your aftercare needs so that they can reassess your need for medications and monitor your lab values.     Increase activity slowly    Complete by:  As directed              Discharge Medications       Medication List    TAKE these medications        amLODipine 10 MG tablet  Commonly known as:  NORVASC  Take 1 tablet (10 mg total) by mouth daily.     antiseptic oral rinse 0.05 % Liqd solution  Commonly known as:  CPC / CETYLPYRIDINIUM CHLORIDE 0.05%  7 mLs by Mouth Rinse route 2 times daily at 12 noon and 4 pm.     bethanechol 10 MG tablet  Commonly known as:  URECHOLINE  Take 1 tablet (10 mg total) by mouth every 8 (eight) hours.     cloNIDine 0.1 MG tablet  Commonly known as:  CATAPRES  Take 1 tablet (0.1 mg total) by mouth 3 (three) times daily.     docusate sodium 100 MG capsule  Commonly known as:  COLACE  Take 2 capsules (200 mg total) by mouth daily.     famotidine 40 MG/5ML suspension  Commonly known as:  PEPCID  Place 2.5 mLs (20 mg total) into feeding tube 2 (two) times daily.     feeding supplement (JEVITY 1.2 CAL) Liqd  Place 1,000 mLs into feeding tube continuous.     feeding supplement (PRO-STAT SUGAR FREE 64) Liqd  Place 30 mLs into feeding tube 2 (two) times daily.     fentaNYL 25 MCG/HR patch  Commonly known as:  DURAGESIC - dosed mcg/hr  Place 1 patch (25 mcg total) onto the skin every 3 (three) days.     insulin aspart 100 UNIT/ML injection  Commonly known as:  novoLOG  Inject 0-20  Units into the skin every 4 (four) hours.     insulin glargine 100 UNIT/ML injection  Commonly known as:  LANTUS  Inject 0.1 mLs (10 Units total) into the skin 2 (two) times daily.     levETIRAcetam 100 MG/ML solution  Commonly known as:  KEPPRA  Place 5 mLs (500 mg total) into feeding tube 2 (two) times daily.     lisinopril 10 MG tablet  Commonly known as:  PRINIVIL,ZESTRIL  Take 1 tablet (10 mg total) by mouth daily.     metoprolol tartrate 25 MG tablet  Commonly known as:  LOPRESSOR  Take 5 tablets (125 mg total) by mouth 2 (two) times daily.     simvastatin 20 MG tablet  Commonly known as:  ZOCOR  Place 1 tablet (20 mg total) into feeding tube daily at 6 PM.     tamsulosin 0.4 MG Caps capsule  Commonly known as:  FLOMAX  Take 1 capsule (0.4 mg total) by mouth daily.        Major procedures and Radiology Reports - PLEASE review detailed and final reports for all details, in brief -   12/15 ETT >> 12/29 12/15 Rt IJ CVL >>12/26 12/15 IVC >> 1/6 12/18 R PICC >> 12/23 12/23 R IJ TLC>>>plan 1/3  Trach (JY) 12/29 >> PEG (JW) 1/4>> Foley 1/3>> DC 02/11/15 PIV x1  STUDIES: 12/15 CT head >> [facility] 12/16 Echo >> severe LVH, EF 60 to 65%, grade 1 diastolic dysfx  SIGNIFICANT EVENTS: 12/15 Admit, SAH >> coiling distal ACA aneurysm, IVC drain placed 12/29 Trach (JY) >>  12/30 Fever 01/03 Fever >> lines changed 01/07 C diff positive 01/09 To telemetry - to hospitalist care on 02/08/2015    Ct Head Wo Contrast  02/04/2015  CLINICAL DATA:  Followup scan.  EVD clamping for 48 hours. EXAM: CT HEAD WITHOUT CONTRAST TECHNIQUE: Contiguous axial images were obtained from the base of the skull through the vertex without intravenous contrast. COMPARISON:  02/02/2015 FINDINGS: Ventriculostomy catheter position is stable, tip near the foramina Roscoe. Persistent frothy secretions and mucosal thickening in the sphenoid sinuses in this patient with previous nasal intubation. No new  calvarial or orbital findings. 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 There is mild lateral ventriculomegaly, best seen at the temporal horns, which is stable. No third or fourth ventricular enlargement. Scattered subarachnoid hemorrhage which is much improved compared to admission and stable from most recent scan. An ovoid hematoma along the anterior interhemispheric fissure is stable in size and density is fading. Stable low-density in the bilateral inferior frontal lobes. No evidence of acute infarct. IMPRESSION: 1. Stable mild lateral ventriculomegaly. 2. Stable subarachnoid hemorrhage compared to 2 days ago. 3. Sphenoid sinusitis Electronically Signed   By: Marnee Spring M.D.   On: 02/04/2015 15:47   Ct Head Wo Contrast  02/02/2015  CLINICAL DATA:  Cerebral edema.  Aneurysm coiling. EXAM: CT HEAD WITHOUT CONTRAST TECHNIQUE: Contiguous axial images were obtained from the base of the skull through the vertex without intravenous contrast. 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 Coil remains in good position. Mild diffuse high-density subarachnoid hemorrhage is unchanged. No new hemorrhage. Right frontal ventricular catheter tip at the foramina [place] unchanged. Ventricles are slightly larger compared with the prior study. Negative for acute infarct.  No shift of the midline structures. Air-fluid level in the sphenoid sinus bilaterally similar to the prior study. 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 No new hemorrhage. Ventricular catheter remains in good position. Mild ventricular dilatation compared with the recent CT. Electronically Signed   By: Marlan Palau M.D.   On: 02/02/2015 12:29   Ct Head Wo  Contrast  01/28/2015  CLINICAL DATA:  Followup acute subarachnoid hemorrhage and coiling of anterior cerebral artery aneurysm EXAM: CT HEAD WITHOUT CONTRAST TECHNIQUE: Contiguous axial images were obtained from the base of the skull through the vertex without intravenous contrast. COMPARISON:  01/13/2015 FINDINGS: Ventriculostomy from a right frontal approach enters the frontal horn of the right lateral ventricle in has its tip in the region of the foramen of Monro. Ventricles are mildly larger than were seen on the presenting scan. Coils are present at the location of the anterior communicating artery aneurysm. Anterior interhemispheric blood collide is becoming smaller and less dense. Diffuse subarachnoid hemorrhage is becoming less dense. No definable hyper acute hemorrhage. No convexity subdural or effusion. Layering fluid is present in the sphenoid sinus. No CT evidence of ischemic infarction. IMPRESSION: Previous coiling of anterior communicating artery aneurysm. Right frontal ventriculostomy. Ventricular size is slightly larger than on the presenting examination. Diminishing density of diffuse subarachnoid blood and and interhemispheric hematoma anteriorly. Electronically Signed   By: Paulina Fusi M.D.   On: 01/28/2015 07:30   Dg Chest Port 1 View  02/06/2015  CLINICAL DATA:  Patient with history of fever. EXAM: PORTABLE CHEST 1 VIEW COMPARISON:  Chest radiograph 02/05/2015. FINDINGS: Tracheostomy tube terminates the mid trachea.  Multiple monitoring leads overlie the patient. Stable enlarged cardiac and mediastinal contours. Elevation of the right hemidiaphragm. Unchanged heterogeneous opacities left lung base. No pleural effusion or pneumothorax. IMPRESSION: Unchanged retrocardiac opacity which may represent atelectasis or infection. Electronically Signed   By: Annia Belt M.D.   On: 02/06/2015 08:51   Dg Chest Port 1 View  02/05/2015  CLINICAL DATA:  50 year old female with fever EXAM: PORTABLE CHEST  1 VIEW COMPARISON:  Prior chest x-ray obtained earlier today at 6 a.m. FINDINGS: Tracheostomy tube remains in stable position. Unchanged cardiomegaly. Unchanged bilateral retrocardiac airspace opacities with a linear configuration slightly more prominent on the left than the right remains unchanged. No acute osseous abnormality. IMPRESSION: No interval change in the appearance of the chest compared to earlier this morning. Persistent left greater than right basilar atelectasis. Electronically Signed   By: Malachy Moan M.D.   On: 02/05/2015 08:55   Dg Chest Port 1 View  02/05/2015  CLINICAL DATA:  Respiratory failure EXAM: PORTABLE CHEST 1 VIEW COMPARISON:  02/01/2015 chest radiograph. FINDINGS: Tracheostomy tube tip overlies the tracheal air column 3.5 cm above the carina. Stable cardiomediastinal silhouette with mild cardiomegaly. No pneumothorax. No pleural effusion. Stable mild platelike atelectasis at the left lung base. No pulmonary edema. IMPRESSION: Well-positioned tracheostomy tube. Stable mild cardiomegaly without overt pulmonary edema. Stable mild platelike atelectasis at the left lung base. Electronically Signed   By: Delbert Phenix M.D.   On: 02/05/2015 07:42   Dg Chest Port 1 View  02/01/2015  CLINICAL DATA:  Respiratory failure. EXAM: PORTABLE CHEST 1 VIEW COMPARISON:  January 28, 2015. FINDINGS: Stable cardiomediastinal silhouette. No pneumothorax or pleural effusion is noted. Tracheostomy tube is unchanged in position. Distal tip of feeding tube is seen in expected position of distal stomach. Stable position of right internal jugular catheter line with distal tip in expected position of upper right atrium. Both lungs are clear. The visualized skeletal structures are unremarkable. IMPRESSION: Stable support apparatus. No acute cardiopulmonary abnormality seen. Electronically Signed   By: Lupita Raider, M.D.   On: 02/01/2015 12:21   Dg Chest Port 1 View  01/28/2015  CLINICAL DATA:   Respiratory failure. EXAM: PORTABLE CHEST 1 VIEW COMPARISON:  01/27/2015 FINDINGS: Again noted is a tracheostomy tube. Jugular central line tip is in the upper right atrium. Feeding tube extends into the abdomen. There is mild peribronchial thickening with increased densities at the lung bases. Slightly low lung volumes. Heart size is stable. IMPRESSION: Low lung volumes with mild peribronchial thickening. Cannot exclude mild interstitial edema. Support apparatuses as described. Electronically Signed   By: Richarda Overlie M.D.   On: 01/28/2015 08:04   Dg Chest Port 1 View  01/27/2015  CLINICAL DATA:  New tracheostomy EXAM: PORTABLE CHEST 1 VIEW COMPARISON:  Chest radiograph from one day prior. FINDINGS: Tracheostomy tube tip overlies the tracheal air column just below the thoracic inlet. Enteric tube enters the stomach with the tip not seen on this image. Right internal jugular central venous catheter terminates at the cavoatrial junction. Stable cardiomediastinal silhouette with normal heart size. No pneumothorax. No pleural effusion. Stable mild left basilar atelectasis. No pulmonary edema. No new lung opacity. IMPRESSION: 1. Well-positioned tracheostomy tube. 2. Mild left basilar atelectasis. Otherwise no active cardiopulmonary disease. Electronically Signed   By: Delbert Phenix M.D.   On: 01/27/2015 11:33   Dg Chest Port 1 View  01/26/2015  CLINICAL DATA:  Respiratory failure. EXAM: PORTABLE CHEST 1 VIEW COMPARISON:  01/24/2015 FINDINGS: Endotracheal  tube is 1.9 cm above the carina. Central line tip in the upper right atrium. Small nodular density in the right upper chest probably related to the ECG lead. Streaky densities in the left lower lung are suggestive for atelectasis. Heart size is normal. Negative for a pneumothorax. Nasogastric tube extends into the abdomen. IMPRESSION: Few densities at the left lung base are suggestive for atelectasis. Otherwise, no focal airspace disease. Support apparatuses as  described. Subtle nodular density in the right upper chest is probably related to the overlying support apparatus as described. Recommend attention to this area on follow up imaging. Electronically Signed   By: Richarda Overlie M.D.   On: 01/26/2015 07:42   Dg Chest Port 1 View  01/24/2015  CLINICAL DATA:  Endotracheal tube placement. EXAM: PORTABLE CHEST 1 VIEW COMPARISON:  01/23/2015 and 01/22/2015. FINDINGS: 0555 hours. The endotracheal tube has been advanced and is now 1 cm above the carina. Right IJ central venous catheter projects to the mid right atrial level. Nasogastric tube projects below the diaphragm, tip not visualized. The heart size and mediastinal contours are stable. There is stable mild atelectasis at both lung bases. No pneumothorax or significant pleural effusion identified. Multiple lines overlie the chest. IMPRESSION: The endotracheal tube is low with its tip within 1 cm of carina. For more optimal positioning, this could be withdrawn 3-4 cm. No other significant changes. Electronically Signed   By: Carey Bullocks M.D.   On: 01/24/2015 08:07   Dg Chest Port 1 View  01/23/2015  CLINICAL DATA:  Evaluate ET tube placement EXAM: PORTABLE CHEST 1 VIEW COMPARISON:  01/22/2015 FINDINGS: The endotracheal tube tip is above the carina. There is a right IJ catheter with tip in the right atrium. Nasogastric tube tip is below the field of view. Normal heart size. Mild pulmonary edema is identified, increased from previous exam. IMPRESSION: 1. Stable support apparatus. 2. Mild pulmonary edema. Electronically Signed   By: Signa Kell M.D.   On: 01/23/2015 09:55   Dg Chest Port 1 View  01/22/2015  CLINICAL DATA:  Central line placement.  Initial encounter. EXAM: PORTABLE CHEST 1 VIEW COMPARISON:  Chest radiograph performed 01/21/2015 FINDINGS: The patient's new right IJ line is noted extending into the right atrium. This could be retracted approximately 5 cm, as deemed clinically appropriate. The  endotracheal tube is seen ending 3-4 cm above the carina. A right PICC is noted ending about the proximal SVC. An enteric tube is noted extending below the diaphragm. The lungs are hypoexpanded. Mild left basilar atelectasis is noted. No pleural effusion or pneumothorax is seen. The cardiomediastinal silhouette is mildly enlarged. No acute osseous abnormalities are identified. IMPRESSION: 1. Right IJ line noted extending into the right atrium. This could be retracted approximately 5 cm to the cavoatrial junction, as deemed clinically appropriate. 2. Lungs hypoexpanded, with mild left basilar atelectasis. 3. Mild cardiomegaly. These results were called by telephone at the time of interpretation on 01/22/2015 at 2:19 am to [facility] on [facility], who verbally acknowledged these results. Electronically Signed   By: Roanna Raider M.D.   On: 01/22/2015 02:19   Dg Chest Port 1 View  01/21/2015  CLINICAL DATA:  Evaluate ET tube placement EXAM: PORTABLE CHEST 1 VIEW COMPARISON:  01/20/2015 FINDINGS: Right arm PICC line tip is in the cavoatrial junction. ET tube tip is above the carina. There is a nasogastric tube with tip in the stomach. Moderate cardiac enlargement. There is no pleural effusion or edema. No airspace consolidation.  IMPRESSION: 1. ET tube tip in satisfactory position above the carina. 2. Stable cardiac enlargement. Electronically Signed   By: Signa Kellaylor  Stroud M.D.   On: 01/21/2015 07:57   Dg Chest Port 1 View  01/20/2015  CLINICAL DATA:  Intubated. EXAM: PORTABLE CHEST 1 VIEW COMPARISON:  01/19/2015 FINDINGS: The endotracheal tube lies 1 cm above the carina. Right PICC terminates over the cavoatrial junction. Cardiac silhouette remains mildly enlarged. Enteric tube courses into the left upper abdomen with tip not imaged. There is improved aeration of the right lung base. Retrocardiac opacity in the left lung base is similar to the prior study. No sizable pleural effusion or pneumothorax is identified.  IMPRESSION: 1. Endotracheal tube 1 cm above the carina. Suggest retracting slightly. 2. Improved aeration of the right lung base. Unchanged left basilar atelectasis. Electronically Signed   By: Sebastian AcheAllen  Grady M.D.   On: 01/20/2015 07:51   Dg Chest Port 1 View  01/19/2015  CLINICAL DATA:  Acute respiratory failure, subarachnoid hemorrhage. EXAM: PORTABLE CHEST 1 VIEW COMPARISON:  Portable chest x-ray of January 17, 2015 FINDINGS: The lungs are adequately inflated. There is subsegmental atelectasis in the retrocardiac region medially on the left and in the right infrahilar region. There is no significant pleural effusion. There is no pneumothorax. The cardiac silhouette is mildly enlarged. The pulmonary vascularity is not engorged. The endotracheal tube tip lies 2.6 cm above the carina. The esophagogastric tube tip projects below the inferior margin of the image. The PICC line tip projects over the midportion of the SVC. IMPRESSION: Mild bibasilar subsegmental atelectasis or infiltrate. Stable cardiomegaly without pulmonary edema. The support tubes are in reasonable position. Electronically Signed   By: David  SwazilandJordan M.D.   On: 01/19/2015 07:38   Dg Chest Port 1 View  01/17/2015  CLINICAL DATA:  Acute respiratory failure, acute intracranial hemorrhage. EXAM: PORTABLE CHEST 1 VIEW COMPARISON:  Portable chest x-ray of January 16, 2015 FINDINGS: There remains mild hypo inflation predominantly on the right. The pulmonary interstitial markings are minimally prominent though stable. There is no alveolar pneumonia. The endotracheal tube tip is 1.6 cm above the carina. The cardiac silhouette remains enlarged. The pulmonary vascularity is not engorged. The esophagogastric tube tip projects below the inferior margin of the image. The observed bony thorax is unremarkable. IMPRESSION: 1. The endotracheal tube tip lies approximately 1.6 cm above the carina. Withdrawal by 2 cm is recommended. 2. Mild hypo inflation  especially on the right. Minimal stable interstitial prominence bilaterally. There is no alveolar pneumonia. Electronically Signed   By: David  [place]azilandJordan M.D.   On: 01/17/2015 08:27   Dg Chest Port 1 View  01/16/2015  CLINICAL DATA:  50 year old female with respiratory failure. EXAM: PORTABLE CHEST 1 VIEW COMPARISON:  01/14/2015 FINDINGS: Cardiomegaly, endotracheal tube with tip 1.8 cm above the carina, left IJ central venous catheter with tip overlying the upper SVC, and NG tube entering the stomach with tip off the field of view again noted. Pulmonary vascular congestion has decreased. Slightly low lung volumes noted with bibasilar atelectasis. There is no evidence of pneumothorax. IMPRESSION: Decreased pulmonary vascular congestion without other significant change. Electronically Signed   By: Harmon PierJeffrey  Hu M.D.   On: 01/16/2015 08:46   Dg Abd Portable 1v  02/09/2015  CLINICAL DATA:  New onset nausea. EXAM: PORTABLE ABDOMEN - 1 VIEW COMPARISON:  01/27/2015 FINDINGS: There is a gastrostomy tube present. There is no bowel dilatation to suggest obstruction. There is no evidence of pneumoperitoneum, portal venous gas or  pneumatosis. There are no pathologic calcifications along the expected course of the ureters. The osseous structures are unremarkable. IMPRESSION: Negative. Electronically Signed   By: Elige Ko   On: 02/09/2015 08:17   Dg Abd Portable 1v  01/27/2015  CLINICAL DATA:  Patient status post feeding tube placement. EXAM: PORTABLE ABDOMEN - 1 VIEW COMPARISON:  Abdominal radiograph 01/13/2015. FINDINGS: Feeding tube is present with tip projecting at the gastric antrum. Gas is demonstrated throughout the colon. No definite evidence for free intraperitoneal air. Cardiomegaly. Focal consolidative opacity within the left lung base. IMPRESSION: Feeding tube tip projects at the gastric antrum. Focal nodular consolidative opacity within the left lung base potentially secondary to an infectious process or  atelectasis. Continued radiographic follow-up is recommended to ensure resolution. Electronically Signed   By: Annia Belt M.D.   On: 01/27/2015 11:39   US Abdomen Limited Ruq  02/11/2015  CLINICAL DATA:  Elevated liver enzymes. EXAM: US ABDOMEN LIMITED - RIGHT UPPER QUADRANT COMPARISON:  None. FINDINGS: Gallbladder: No gallstones or wall thickening visualized. No sonographic Murphy sign noted by sonographer. Common bile duct: Diameter: 2.7 mm Liver: No focal lesion identified. Within normal limits in parenchymal echogenicity. IMPRESSION: Negative exam. Electronically Signed   By: Maisie Fus  Register   On: 02/11/2015 07:42    Micro Results      Recent Results (from the past 240 hour(s))  C difficile quick scan w PCR reflex     Status: Abnormal   Collection Time: 02/05/15  9:45 AM  Result Value Ref Range Status   C Diff antigen POSITIVE (A) NEGATIVE Final   C Diff toxin POSITIVE (A) NEGATIVE Final   C Diff interpretation Positive for toxigenic C. difficile  Final    Comment: CRITICAL RESULT CALLED TO, READ BACK BY AND VERIFIED WITH: SPOKE TO RN A.SMITH @ 8:18 ON 02/06/2015 K.PEELE   Culture, blood (Routine X 2) w Reflex to ID Panel     Status: None   Collection Time: 02/05/15 11:25 AM  Result Value Ref Range Status   Specimen Description BLOOD LEFT HAND  Final   Special Requests IN PEDIATRIC BOTTLE 5CC  Final   Culture NO GROWTH 5 DAYS  Final   Report Status 02/10/2015 FINAL  Final  Culture, blood (Routine X 2) w Reflex to ID Panel     Status: None   Collection Time: 02/05/15 11:30 AM  Result Value Ref Range Status   Specimen Description BLOOD LEFT ANTECUBITAL  Final   Special Requests BOTTLES DRAWN AEROBIC AND ANAEROBIC 5CC  Final   Culture NO GROWTH 5 DAYS  Final   Report Status 02/10/2015 FINAL  Final  Culture, Urine     Status: None   Collection Time: 02/05/15 11:43 AM  Result Value Ref Range Status   Specimen Description URINE, CATHETERIZED  Final   Special Requests NONE  Final    Culture NO GROWTH 1 DAY  Final   Report Status 02/06/2015 FINAL  Final    Today   Subjective   Carroll Kinds today has no headache,no chest abdominal pain,no new weakness tingling or numbness, feels much better wants to go home today.   Objective   Blood pressure 136/90, pulse 106, temperature 98.1 F (36.7 C), temperature source Axillary, resp. rate 20, height 5\' 5"  (1.651 m), weight 72.2 kg (159 lb 2.8 oz), SpO2 98 %.   Intake/Output Summary (Last 24 hours) at 02/14/15 1009 Last data filed at 02/14/15 0620  Gross per 24 hour  Intake  100 ml  Output    825 ml  Net   -725 ml    Exam Awake , responds to painfull stimuli, [place].AT,  Supple Neck, Tracheostomy in place with #6 cuffless.No JVD, No cervical lymphadenopathy appriciated.  Symmetrical Chest wall movement, Good air movement bilaterally, CTAB RRR,No Gallops,Rubs or new Murmurs, No Parasternal Heave +ve B.Sounds, Abd Soft, Non tender, No organomegaly appriciated, No rebound -guarding or rigidity. PEG site stable No Cyanosis, Clubbing or edema, No new Rash or bruise   Data Review   CBC w Diff:  Lab Results  Component Value Date   WBC 6.2 02/10/2015   HGB 9.9* 02/10/2015   HCT 31.1* 02/10/2015   PLT 272 02/10/2015   LYMPHOPCT 5 02/06/2015   MONOPCT 5 02/06/2015   EOSPCT 0 02/06/2015   BASOPCT 0 02/06/2015    CMP:  Lab Results  Component Value Date   NA 144 02/11/2015   K 3.7 02/11/2015   CL 109 02/11/2015   CO2 27 02/11/2015   BUN 21* 02/11/2015   CREATININE 0.58 02/11/2015   PROT 6.1* 02/11/2015   ALBUMIN 2.3* 02/11/2015   BILITOT 0.5 02/11/2015   ALKPHOS 124 02/11/2015   AST 34 02/11/2015   ALT 106* 02/11/2015    Total Time in preparing paper work, data evaluation and todays exam - 35 minutes  Susa Raring K M.D on 02/14/2015 at 10:09 AM  [facility]   Office  [phone]

## 2015-02-11 NOTE — Progress Notes (Signed)
CM spoke with bedside RN to discuss the need for tracheostomy to be changed to cuffless for planned discharge today.  Bedside RN to page respiratory therapy for change.  Elmer Balesourtney Neftali Thurow RN, MSN [phone]

## 2015-02-12 LAB — GLUCOSE, CAPILLARY
GLUCOSE-CAPILLARY: 136 mg/dL — AB (ref 65–99)
GLUCOSE-CAPILLARY: 143 mg/dL — AB (ref 65–99)
Glucose-Capillary: 138 mg/dL — ABNORMAL HIGH (ref 65–99)
Glucose-Capillary: 149 mg/dL — ABNORMAL HIGH (ref 65–99)
Glucose-Capillary: 158 mg/dL — ABNORMAL HIGH (ref 65–99)
Glucose-Capillary: 153 mg/dL — ABNORMAL HIGH (ref 65–99)

## 2015-02-12 MED ORDER — TAMSULOSIN HCL 0.4 MG PO CAPS: 0.4000 mg | ORAL_CAPSULE | Freq: Every day | ORAL | Status: DC

## 2015-02-12 MED ORDER — TAMSULOSIN HCL 0.4 MG PO CAPS
0.4000 mg | ORAL_CAPSULE | Freq: Every day | ORAL | Status: DC
  Administered 2015-02-12 – 2015-02-14 (×3): 0.4 mg via ORAL
  Filled 2015-02-12 (×3): qty 1

## 2015-02-12 NOTE — Clinical Social Work Note (Addendum)
CSW was informed by [facility] that they can not take patient until Monday.  CSW updated clinical social work Chiropodistassistant director [place] SilosZachary Brooks and physician.  CSW to continue to follow patient's progress throughout discharge planning.  CSW attempted to call patient's son left a message awaiting call back.  Ervin KnackEric R. Yi Falletta, MSW, LCSWA [phone] 02/12/2015 11:15 AM

## 2015-02-12 NOTE — Progress Notes (Signed)
Patient ID: Rachel MinksStephanie N Vang, female   DOB: 01/04/1966, 50 y.o.   MRN: [id number] Subjective:  The patient is attentive but does not follow commands. She is in no apparent distress.  Objective: Vital signs in last 24 hours: Temp:  [98.2 F (36.8 C)-100 F (37.8 C)] 98.2 F (36.8 C) (01/14 0519) Pulse Rate:  [87-102] 88 (01/14 0519) Resp:  [16-20] 16 (01/14 0519) BP: (111-151)/(71-91) 121/72 mmHg (01/14 0519) SpO2:  [99 %-100 %] 99 % (01/14 0519) FiO2 (%):  [28 %] 28 % (01/14 0416)  Intake/Output from previous day: 01/13 0701 - 01/14 0700 In: -  Out: 550 [Urine:550] Intake/Output this shift:    Physical exam the patient is attentive. Glasgow Coma Scale 10, [id number]. She lies is on the left. She had normal reflexes on the right. Her scalp wounds are healing well without drainage.  Lab Results:  Recent Labs  02/10/15 0603  WBC 6.2  HGB 9.9*  HCT 31.1*  PLT 272   BMET  Recent Labs  02/10/15 0603 02/11/15 0455  NA 146* 144  K 3.8 3.7  CL 112* 109  CO2 25 27  GLUCOSE 182* 154*  BUN 22* 21*  CREATININE 0.54 0.58  CALCIUM 9.1 9.2    Studies/Results: [place] Abdomen Limited Ruq  02/11/2015  CLINICAL DATA:  Elevated liver enzymes. EXAM: US ABDOMEN LIMITED - RIGHT UPPER QUADRANT COMPARISON:  None. FINDINGS: Gallbladder: No gallstones or wall thickening visualized. No sonographic Murphy sign noted by sonographer. Common bile duct: Diameter: 2.7 mm Liver: No focal lesion identified. Within normal limits in parenchymal echogenicity. IMPRESSION: Negative exam. Electronically Signed   By: Maisie Fushomas  Register   On: 02/11/2015 07:42    Assessment/Plan: Subarachnoid hemorrhage: She is awaiting nursing home placement.  LOS: 30 days     Reeves Musick D 02/12/2015, 8:08 AM

## 2015-02-12 NOTE — Progress Notes (Signed)
Scanned patient's bladder, amount was 302 ml. Will continue to monitor at this time.

## 2015-02-13 LAB — GLUCOSE, CAPILLARY
GLUCOSE-CAPILLARY: 138 mg/dL — AB (ref 65–99)
GLUCOSE-CAPILLARY: 156 mg/dL — AB (ref 65–99)
GLUCOSE-CAPILLARY: 143 mg/dL — AB (ref 65–99)
GLUCOSE-CAPILLARY: 135 mg/dL — AB (ref 65–99)
Glucose-Capillary: 165 mg/dL — ABNORMAL HIGH (ref 65–99)
Glucose-Capillary: 155 mg/dL — ABNORMAL HIGH (ref 65–99)

## 2015-02-13 MED ORDER — DOCUSATE SODIUM 100 MG PO CAPS
200.0000 mg | ORAL_CAPSULE | Freq: Every day | ORAL | Status: DC
  Administered 2015-02-13: 200 mg via ORAL
  Filled 2015-02-13: qty 2

## 2015-02-13 MED ORDER — DOCUSATE SODIUM 100 MG PO CAPS
200.0000 mg | ORAL_CAPSULE | Freq: Every day | ORAL | Status: DC
Start: 2015-02-13 — End: 2015-02-22

## 2015-02-13 NOTE — Progress Notes (Signed)
Patient ID: Rachel MinksStephanie N Vang, female   DOB: 10/01/1965, 50 y.o.   MRN: [id number] Neuro unchanged. No new medical problems. No f/c

## 2015-02-13 NOTE — Progress Notes (Signed)
Pt is stable at this time sleeping comfortably, no complications noted.

## 2015-02-14 ENCOUNTER — Non-Acute Institutional Stay (SKILLED_NURSING_FACILITY): Payer: Medicaid Other | Admitting: Internal Medicine

## 2015-02-14 DIAGNOSIS — B9620 Unspecified Escherichia coli [E. coli] as the cause of diseases classified elsewhere: Secondary | ICD-10-CM

## 2015-02-14 DIAGNOSIS — A047 Enterocolitis due to Clostridium difficile: Secondary | ICD-10-CM | POA: Diagnosis not present

## 2015-02-14 DIAGNOSIS — I609 Nontraumatic subarachnoid hemorrhage, unspecified: Secondary | ICD-10-CM | POA: Diagnosis not present

## 2015-02-14 DIAGNOSIS — I610 Nontraumatic intracerebral hemorrhage in hemisphere, subcortical: Secondary | ICD-10-CM | POA: Diagnosis not present

## 2015-02-14 DIAGNOSIS — B963 Hemophilus influenzae [H. influenzae] as the cause of diseases classified elsewhere: Secondary | ICD-10-CM

## 2015-02-14 DIAGNOSIS — R778 Other specified abnormalities of plasma proteins: Secondary | ICD-10-CM

## 2015-02-14 DIAGNOSIS — R1310 Dysphagia, unspecified: Secondary | ICD-10-CM

## 2015-02-14 DIAGNOSIS — J9602 Acute respiratory failure with hypercapnia: Secondary | ICD-10-CM

## 2015-02-14 DIAGNOSIS — I10 Essential (primary) hypertension: Secondary | ICD-10-CM

## 2015-02-14 DIAGNOSIS — R569 Unspecified convulsions: Secondary | ICD-10-CM

## 2015-02-14 DIAGNOSIS — J9601 Acute respiratory failure with hypoxia: Secondary | ICD-10-CM

## 2015-02-14 DIAGNOSIS — A0472 Enterocolitis due to Clostridium difficile, not specified as recurrent: Secondary | ICD-10-CM

## 2015-02-14 DIAGNOSIS — J40 Bronchitis, not specified as acute or chronic: Secondary | ICD-10-CM

## 2015-02-14 DIAGNOSIS — R339 Retention of urine, unspecified: Secondary | ICD-10-CM

## 2015-02-14 DIAGNOSIS — R7989 Other specified abnormal findings of blood chemistry: Secondary | ICD-10-CM

## 2015-02-14 DIAGNOSIS — N390 Urinary tract infection, site not specified: Secondary | ICD-10-CM

## 2015-02-14 LAB — GLUCOSE, CAPILLARY
GLUCOSE-CAPILLARY: 136 mg/dL — AB (ref 65–99)
GLUCOSE-CAPILLARY: 161 mg/dL — AB (ref 65–99)
Glucose-Capillary: 133 mg/dL — ABNORMAL HIGH (ref 65–99)

## 2015-02-14 NOTE — Care Management Note (Signed)
Case Management Note  Patient Details  Name: Rachel MinksStephanie N Vang MRN: [id number] Date of Birth: 08/29/1965  Subjective/Objective:                    Action/Plan: Patient discharging to SNF today. No further needs per CM.   Expected Discharge Date:  02/18/15               Expected Discharge Plan:  Skilled Nursing Facility  In-House Referral:  Clinical Social Work  Discharge planning Services  CM Consult  Post Acute Care Choice:    Choice offered to:     DME Arranged:    DME Agency:     HH Arranged:    HH Agency:     Status of Service:  In process, will continue to follow  Medicare Important Message Given:    Date Medicare IM Given:    Medicare IM give by:    Date Additional Medicare IM Given:    Additional Medicare Important Message give by:     If discussed at Long Length of Stay Meetings, dates discussed:    Additional Comments:  Kermit BaloKelli F Roselani Grajeda, RN 02/14/2015, 11:41 AM

## 2015-02-14 NOTE — Progress Notes (Signed)
CSW spoke with the Pt's son Rachel Vang(Rachel Vang) to arrange d/c to [facility].   Pt's son stated that his cousin is going to call Rachel Vang and set up a time to sign paperwork.   Rachel Vang contacted CSW requesting some additional information prior to sending Pt. CSW provided information and faxed d/c summary and SNF report for d/c.   CSW will contact nurse for discharge around 11:30 (per facilities availability.   CSW will continue to follow for d/c planning and support.    Rachel CarboCassandra Giavanni Vang LCSWA  [facility]  [phone]

## 2015-02-14 NOTE — Progress Notes (Signed)
Pt to be d/c today to [facility].  Pt and family agreeable. Confirmed plans with facility.  Plan transfer via EMS.    Leron Croakassandra Khai Arrona LCSWA  [facility]  5C and 6N

## 2015-02-14 NOTE — Progress Notes (Signed)
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 Trach changed to #6 cuffless on 1/13  REVIEW OF SYSTEMS: Unable to obtain given altered mentation.  VITAL SIGNS: BP 136/90 mmHg  Pulse 106  Temp(Src) 98.1 F (36.7 C) (Axillary)  Resp 20  Ht 5\' 5"  (1.651 m)  Wt 159 lb 2.8 oz (72.2 kg)  BMI 26.49 kg/m2  SpO2 98%  LMP  (LMP Unknown)  INTAKE / OUTPUT: I/O last 3 completed shifts: In: 100 [P.O.:100] Out: 1125 [Urine:1125]  PHYSICAL EXAMINATION: General: Eyes open. No distress.  Integument:  Warm & dry. No rash, HEENT:  Tracheostomy in place with #6 cuffless. Moist mucous membranes. Cardiovascular:  Regular rate. No MRG Pulmonary:  Clear bilaterally to auscultation anteriorly.  Abdomen: Soft. Normal bowel sounds. PEG tube in place.   CBC No results for input(s): WBC, HGB, HCT, PLT in the last 72 hours.  BMET No results for input(s): NA, K, CL, CO2, BUN, CREATININE, GLUCOSE in the last 72 hours.  Electrolytes No results for input(s): CALCIUM, MG, PHOS in the last 72 hours.  Sepsis Markers No results for input(s): PROCALCITON, O2SATVEN in the last 72 hours.  Invalid input(s): LACTICACIDVEN  Glucose Recent Labs     02/13/15  1136  02/13/15  1634  02/13/15  2017  02/13/15  2351  02/14/15  0408  02/14/15  0823   GLUCAP  155*  143*  135*  136*  133*  161*    Imaging No results found.  ASSESSMENT / PLAN:  50 yo female smoker presented with altered mental status, Rt pupil dilation from HTN emergency (BP 245/194) and SAH. Patient has tolerated transition to tracheostomy collar after percutaneous tracheostomy was placed on 12/29. Suspect secretions are due to lack of humidified oxygen. No signs of infection with fever or leukocytosis.  1. Compromised Airway:  Secondary to acute encephalopathy. S/P Tracheostomy & PEG placement. Now with #6 cuffless since Friday. 2. 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 SAH:  S/P Coiling. Care per Neurosurgery.  Remainder of patient's care per primary service.  Chilton GreathousePraveen Meeghan Skipper MD [facility] Pager [phone] If no answer or after 3pm call: [phone] 02/14/2015, 8:46 AM

## 2015-02-14 NOTE — Progress Notes (Signed)
Discharge orders received. Report [facility]. IV removed. RT paged to get patient ready for transport. Pt dressed and belongings packed. [facility] transporting pt to SNF.

## 2015-02-14 NOTE — Progress Notes (Signed)
Came to assess pt, pt inner cannula was clean with trach cleaning kit with NS and sterile water. Pt was suctioned, pt was stable throughout procedure no complications noted and O2 saturations were stable throughout. Got back thick sticky clear/tan secretions. After assessment and trach care pt is now back resting comfortably. No distress noted.

## 2015-02-14 NOTE — Progress Notes (Signed)
CSW attenpted to contact Pt's son Jeannett SeniorStephen for d/c planning. Pt's son did not answer the phone. CSW left a message.   CSW contacted Reyne Dumasammy Blakely for d/c planning to [facility]. Ms. Emelia LoronBlakely stated that they are waiting for the bed to be ready and that after her meeting she will contact facility to check on bed availability.   CSW will continue to follow for d/c planning.    Lenord CarboCassandra Sarabi Sockwell LCSWA  [facility]  [phone]

## 2015-02-15 LAB — BASIC METABOLIC PANEL
BUN: 19 mg/dL (ref 4–21)
CREATININE: 0.5 mg/dL (ref 0.5–1.1)
GLUCOSE: 198 mg/dL
POTASSIUM: 4.6 mmol/L (ref 3.4–5.3)
SODIUM: 141 mmol/L (ref 137–147)

## 2015-02-15 LAB — CBC AND DIFFERENTIAL
HCT: 33 % — AB (ref 36–46)
Hemoglobin: 10.7 g/dL — AB (ref 12.0–16.0)
Platelets: 237 10*3/uL (ref 150–399)
WBC: 11.4 10*3/mL

## 2015-02-15 LAB — GLUCOSE, CAPILLARY: GLUCOSE-CAPILLARY: 157 mg/dL — AB (ref 65–99)

## 2015-02-17 ENCOUNTER — Inpatient Hospital Stay (HOSPITAL_COMMUNITY)
Admission: EM | Admit: 2015-02-17 | Discharge: 2015-02-22 | DRG: 871 | Disposition: A | Discharge status: Skilled Nursing Facility | Payer: Medicaid Other | Attending: Internal Medicine | Admitting: Internal Medicine

## 2015-02-17 ENCOUNTER — Inpatient Hospital Stay (HOSPITAL_COMMUNITY): Payer: Medicaid Other

## 2015-02-17 ENCOUNTER — Encounter (HOSPITAL_COMMUNITY): Payer: Self-pay

## 2015-02-17 ENCOUNTER — Emergency Department (HOSPITAL_COMMUNITY): Payer: Medicaid Other

## 2015-02-17 ENCOUNTER — Non-Acute Institutional Stay (SKILLED_NURSING_FACILITY): Payer: Medicaid Other | Admitting: Internal Medicine

## 2015-02-17 DIAGNOSIS — K589 Irritable bowel syndrome without diarrhea: Secondary | ICD-10-CM | POA: Diagnosis present

## 2015-02-17 DIAGNOSIS — Z794 Long term (current) use of insulin: Secondary | ICD-10-CM | POA: Diagnosis not present

## 2015-02-17 DIAGNOSIS — J9621 Acute and chronic respiratory failure with hypoxia: Secondary | ICD-10-CM | POA: Diagnosis not present

## 2015-02-17 DIAGNOSIS — N390 Urinary tract infection, site not specified: Secondary | ICD-10-CM | POA: Diagnosis present

## 2015-02-17 DIAGNOSIS — K5730 Diverticulosis of large intestine without perforation or abscess without bleeding: Secondary | ICD-10-CM | POA: Diagnosis present

## 2015-02-17 DIAGNOSIS — A4151 Sepsis due to Escherichia coli [E. coli]: Principal | ICD-10-CM | POA: Diagnosis present

## 2015-02-17 DIAGNOSIS — Z87891 Personal history of nicotine dependence: Secondary | ICD-10-CM

## 2015-02-17 DIAGNOSIS — I10 Essential (primary) hypertension: Secondary | ICD-10-CM | POA: Diagnosis present

## 2015-02-17 DIAGNOSIS — Z930 Tracheostomy status: Secondary | ICD-10-CM

## 2015-02-17 DIAGNOSIS — G40909 Epilepsy, unspecified, not intractable, without status epilepticus: Secondary | ICD-10-CM | POA: Diagnosis present

## 2015-02-17 DIAGNOSIS — A419 Sepsis, unspecified organism: Secondary | ICD-10-CM | POA: Diagnosis present

## 2015-02-17 DIAGNOSIS — O2230 Deep phlebothrombosis in pregnancy, unspecified trimester: Secondary | ICD-10-CM

## 2015-02-17 DIAGNOSIS — N920 Excessive and frequent menstruation with regular cycle: Secondary | ICD-10-CM

## 2015-02-17 DIAGNOSIS — Z90710 Acquired absence of both cervix and uterus: Secondary | ICD-10-CM | POA: Diagnosis not present

## 2015-02-17 DIAGNOSIS — I619 Nontraumatic intracerebral hemorrhage, unspecified: Secondary | ICD-10-CM

## 2015-02-17 DIAGNOSIS — D62 Acute posthemorrhagic anemia: Secondary | ICD-10-CM | POA: Diagnosis not present

## 2015-02-17 DIAGNOSIS — I82402 Acute embolism and thrombosis of unspecified deep veins of left lower extremity: Secondary | ICD-10-CM | POA: Diagnosis present

## 2015-02-17 DIAGNOSIS — J9610 Chronic respiratory failure, unspecified whether with hypoxia or hypercapnia: Secondary | ICD-10-CM | POA: Diagnosis present

## 2015-02-17 DIAGNOSIS — Z931 Gastrostomy status: Secondary | ICD-10-CM

## 2015-02-17 DIAGNOSIS — R1310 Dysphagia, unspecified: Secondary | ICD-10-CM | POA: Diagnosis present

## 2015-02-17 DIAGNOSIS — Z8249 Family history of ischemic heart disease and other diseases of the circulatory system: Secondary | ICD-10-CM | POA: Diagnosis not present

## 2015-02-17 DIAGNOSIS — K922 Gastrointestinal hemorrhage, unspecified: Secondary | ICD-10-CM

## 2015-02-17 DIAGNOSIS — E785 Hyperlipidemia, unspecified: Secondary | ICD-10-CM | POA: Diagnosis present

## 2015-02-17 DIAGNOSIS — E872 Acidosis: Secondary | ICD-10-CM | POA: Diagnosis present

## 2015-02-17 DIAGNOSIS — E119 Type 2 diabetes mellitus without complications: Secondary | ICD-10-CM | POA: Diagnosis present

## 2015-02-17 DIAGNOSIS — I610 Nontraumatic intracerebral hemorrhage in hemisphere, subcortical: Secondary | ICD-10-CM

## 2015-02-17 DIAGNOSIS — I2699 Other pulmonary embolism without acute cor pulmonale: Secondary | ICD-10-CM | POA: Diagnosis present

## 2015-02-17 DIAGNOSIS — K648 Other hemorrhoids: Secondary | ICD-10-CM | POA: Diagnosis present

## 2015-02-17 HISTORY — DX: Irritable bowel syndrome, unspecified: K58.9

## 2015-02-17 HISTORY — DX: Dysphagia, unspecified: R13.10

## 2015-02-17 HISTORY — DX: Retention of urine, unspecified: R33.9

## 2015-02-17 HISTORY — DX: Epilepsy, unspecified, not intractable, without status epilepticus: G40.909

## 2015-02-17 HISTORY — DX: Hyperlipidemia, unspecified: E78.5

## 2015-02-17 HISTORY — DX: Acute respiratory failure, unspecified whether with hypoxia or hypercapnia: J96.00

## 2015-02-17 HISTORY — DX: Gastro-esophageal reflux disease without esophagitis: K21.9

## 2015-02-17 HISTORY — DX: Nontraumatic subarachnoid hemorrhage, unspecified: I60.9

## 2015-02-17 HISTORY — DX: Type 2 diabetes mellitus without complications: E11.9

## 2015-02-17 LAB — CBC
HCT: 37.4 % (ref 36.0–46.0)
HEMOGLOBIN: 11.9 g/dL — AB (ref 12.0–15.0)
MCH: 30.9 pg (ref 26.0–34.0)
MCHC: 31.8 g/dL (ref 30.0–36.0)
MCV: 97.1 fL (ref 78.0–100.0)
Platelets: 250 10*3/uL (ref 150–400)
RBC: 3.85 MIL/uL — AB (ref 3.87–5.11)
RDW: 16.4 % — ABNORMAL HIGH (ref 11.5–15.5)
WBC: 15.9 10*3/uL — ABNORMAL HIGH (ref 4.0–10.5)

## 2015-02-17 LAB — I-STAT ARTERIAL BLOOD GAS, ED
ACID-BASE EXCESS: 4 mmol/L — AB (ref 0.0–2.0)
Bicarbonate: 27.4 mEq/L — ABNORMAL HIGH (ref 20.0–24.0)
O2 Saturation: 99 %
PCO2 ART: 35.9 mmHg (ref 35.0–45.0)
PH ART: 7.49 — AB (ref 7.350–7.450)
PO2 ART: 150 mmHg — AB (ref 80.0–100.0)
Patient temperature: 98.6
TCO2: 28 mmol/L (ref 0–100)

## 2015-02-17 LAB — COMPREHENSIVE METABOLIC PANEL
ALK PHOS: 144 U/L — AB (ref 38–126)
ALT: 57 U/L — AB (ref 14–54)
ANION GAP: 14 (ref 5–15)
AST: 27 U/L (ref 15–41)
Albumin: 2.9 g/dL — ABNORMAL LOW (ref 3.5–5.0)
BILIRUBIN TOTAL: 0.1 mg/dL — AB (ref 0.3–1.2)
BUN: 22 mg/dL — ABNORMAL HIGH (ref 6–20)
CO2: 25 mmol/L (ref 22–32)
CREATININE: 0.84 mg/dL (ref 0.44–1.00)
Calcium: 9.8 mg/dL (ref 8.9–10.3)
Chloride: 103 mmol/L (ref 101–111)
Glucose, Bld: 201 mg/dL — ABNORMAL HIGH (ref 65–99)
Potassium: 4.4 mmol/L (ref 3.5–5.1)
SODIUM: 142 mmol/L (ref 135–145)
Total Protein: 7.4 g/dL (ref 6.5–8.1)

## 2015-02-17 LAB — PROTIME-INR
INR: 1.21 (ref 0.00–1.49)
PROTHROMBIN TIME: 15.5 s — AB (ref 11.6–15.2)

## 2015-02-17 LAB — URINE MICROSCOPIC-ADD ON

## 2015-02-17 LAB — URINALYSIS, ROUTINE W REFLEX MICROSCOPIC
BILIRUBIN URINE: NEGATIVE
GLUCOSE, UA: NEGATIVE mg/dL
Ketones, ur: NEGATIVE mg/dL
NITRITE: POSITIVE — AB
PH: 7.5 (ref 5.0–8.0)
Protein, ur: 100 mg/dL — AB
SPECIFIC GRAVITY, URINE: 1.023 (ref 1.005–1.030)

## 2015-02-17 LAB — CG4 I-STAT (LACTIC ACID): Lactic Acid, Venous: 1.66 mmol/L (ref 0.5–2.0)

## 2015-02-17 LAB — I-STAT TROPONIN, ED: TROPONIN I, POC: 0.02 ng/mL (ref 0.00–0.08)

## 2015-02-17 LAB — I-STAT CG4 LACTIC ACID, ED: LACTIC ACID, VENOUS: 2.36 mmol/L — AB (ref 0.5–2.0)

## 2015-02-17 LAB — MRSA PCR SCREENING: MRSA by PCR: NEGATIVE

## 2015-02-17 LAB — GLUCOSE, CAPILLARY: GLUCOSE-CAPILLARY: 120 mg/dL — AB (ref 65–99)

## 2015-02-17 LAB — BRAIN NATRIURETIC PEPTIDE: B NATRIURETIC PEPTIDE 5: 55.6 pg/mL (ref 0.0–100.0)

## 2015-02-17 MED ORDER — LEVETIRACETAM 100 MG/ML PO SOLN
500.0000 mg | Freq: Two times a day (BID) | ORAL | Status: DC
  Administered 2015-02-18 – 2015-02-22 (×10): 500 mg
  Filled 2015-02-17 (×13): qty 5

## 2015-02-17 MED ORDER — VANCOMYCIN HCL 10 G IV SOLR
1500.0000 mg | Freq: Once | INTRAVENOUS | Status: AC
  Administered 2015-02-17: 1500 mg via INTRAVENOUS
  Filled 2015-02-17: qty 1500

## 2015-02-17 MED ORDER — SODIUM CHLORIDE 0.9 % IV BOLUS (SEPSIS)
1000.0000 mL | Freq: Once | INTRAVENOUS | Status: AC
  Administered 2015-02-17: 1000 mL via INTRAVENOUS

## 2015-02-17 MED ORDER — PIPERACILLIN-TAZOBACTAM 3.375 G IVPB
3.3750 g | Freq: Three times a day (TID) | INTRAVENOUS | Status: DC
  Administered 2015-02-17 – 2015-02-20 (×8): 3.375 g via INTRAVENOUS
  Filled 2015-02-17 (×11): qty 50

## 2015-02-17 MED ORDER — IOHEXOL 350 MG/ML SOLN
80.0000 mL | Freq: Once | INTRAVENOUS | Status: AC | PRN
  Administered 2015-02-17: 80 mL via INTRAVENOUS

## 2015-02-17 MED ORDER — BETHANECHOL CHLORIDE 10 MG PO TABS
10.0000 mg | ORAL_TABLET | Freq: Three times a day (TID) | ORAL | Status: DC
  Administered 2015-02-17 – 2015-02-22 (×15): 10 mg via ORAL
  Filled 2015-02-17 (×17): qty 1

## 2015-02-17 MED ORDER — SODIUM CHLORIDE 0.9 % IJ SOLN
3.0000 mL | Freq: Two times a day (BID) | INTRAMUSCULAR | Status: DC
  Administered 2015-02-17 – 2015-02-21 (×8): 3 mL via INTRAVENOUS

## 2015-02-17 MED ORDER — FENTANYL 25 MCG/HR TD PT72
25.0000 ug | MEDICATED_PATCH | TRANSDERMAL | Status: DC
  Administered 2015-02-17 – 2015-02-20 (×2): 25 ug via TRANSDERMAL
  Filled 2015-02-17 (×2): qty 1

## 2015-02-17 MED ORDER — SODIUM CHLORIDE 0.9 % IV BOLUS (SEPSIS)
250.0000 mL | Freq: Once | INTRAVENOUS | Status: AC
  Administered 2015-02-17: 250 mL via INTRAVENOUS

## 2015-02-17 MED ORDER — PIPERACILLIN-TAZOBACTAM 3.375 G IVPB 30 MIN
3.3750 g | Freq: Once | INTRAVENOUS | Status: AC
  Administered 2015-02-17: 3.375 g via INTRAVENOUS
  Filled 2015-02-17: qty 50

## 2015-02-17 MED ORDER — METOPROLOL TARTRATE 25 MG PO TABS
125.0000 mg | ORAL_TABLET | Freq: Two times a day (BID) | ORAL | Status: DC
  Administered 2015-02-17 – 2015-02-22 (×10): 125 mg via ORAL
  Filled 2015-02-17 (×10): qty 1

## 2015-02-17 MED ORDER — JEVITY 1.2 CAL PO LIQD
1000.0000 mL | ORAL | Status: DC
  Administered 2015-02-18 (×2): 1000 mL
  Administered 2015-02-20: 15:00:00
  Administered 2015-02-22: 50 mL/h
  Filled 2015-02-17 (×11): qty 1000

## 2015-02-17 MED ORDER — ALBUTEROL SULFATE (2.5 MG/3ML) 0.083% IN NEBU: 2.5000 mg | INHALATION_SOLUTION | RESPIRATORY_TRACT | Status: DC | PRN

## 2015-02-17 MED ORDER — VANCOMYCIN HCL IN DEXTROSE 1-5 GM/200ML-% IV SOLN
1000.0000 mg | Freq: Two times a day (BID) | INTRAVENOUS | Status: DC
  Administered 2015-02-18 – 2015-02-19 (×3): 1000 mg via INTRAVENOUS
  Filled 2015-02-17 (×5): qty 200

## 2015-02-17 MED ORDER — PRO-STAT SUGAR FREE PO LIQD
30.0000 mL | Freq: Two times a day (BID) | ORAL | Status: DC
  Administered 2015-02-17 – 2015-02-22 (×10): 30 mL
  Filled 2015-02-17 (×10): qty 30

## 2015-02-17 MED ORDER — INSULIN ASPART 100 UNIT/ML ~~LOC~~ SOLN
0.0000 [IU] | SUBCUTANEOUS | Status: DC
  Administered 2015-02-18 (×3): 1 [IU] via SUBCUTANEOUS
  Administered 2015-02-18 – 2015-02-19 (×3): 2 [IU] via SUBCUTANEOUS
  Administered 2015-02-19: 1 [IU] via SUBCUTANEOUS
  Administered 2015-02-19 – 2015-02-20 (×2): 2 [IU] via SUBCUTANEOUS
  Administered 2015-02-20 – 2015-02-21 (×3): 1 [IU] via SUBCUTANEOUS
  Administered 2015-02-21: 2 [IU] via SUBCUTANEOUS
  Administered 2015-02-21 – 2015-02-22 (×5): 1 [IU] via SUBCUTANEOUS

## 2015-02-17 MED ORDER — SODIUM CHLORIDE 0.9 % IV SOLN
INTRAVENOUS | Status: DC
  Administered 2015-02-17 – 2015-02-18 (×3): via INTRAVENOUS

## 2015-02-17 NOTE — ED Notes (Signed)
Attempted Report x1.   

## 2015-02-17 NOTE — ED Notes (Signed)
Phlebotomy at the bedside  

## 2015-02-17 NOTE — ED Notes (Signed)
Resident at the bedside

## 2015-02-17 NOTE — H&P (Signed)
Patient Demographics  Rachel Vang, is a 50 y.o. female  MRN: [id number]   DOB - [birth year]  Admit Date - 02/17/2015  Outpatient Primary MD for the patient is No primary care provider on file.   With History of -  Past Medical History  Diagnosis Date  . Hypertension   . Acute respiratory failure (HCC)   . Epilepsy (HCC)   . GERD (gastroesophageal reflux disease)   . Nontraumatic subarachnoid hemorrhage (HCC)   . Urinary retention   . Dysphagia   . Hyperlipidemia   . IBS (irritable bowel syndrome)   . Diabetes mellitus without complication (HCC)     Type 2, W/o complications      Past Surgical History  Procedure Laterality Date  . Radiology with anesthesia N/A 01/13/2015    Procedure: RADIOLOGY WITH ANESTHESIA;  Surgeon: Lisbeth Renshaw, MD;  Location: [facility] OR;  Service: Radiology;  Laterality: N/A;  . Esophagogastroduodenoscopy (egd) with propofol N/A 02/02/2015    Procedure: ESOPHAGOGASTRODUODENOSCOPY (EGD) WITH PROPOFOL;  Surgeon: Jimmye Norman, MD;  Location: [facility] ENDOSCOPY;  Service: General;  Laterality: N/A;  . Peg placement N/A 02/02/2015    Procedure: PERCUTANEOUS ENDOSCOPIC GASTROSTOMY (PEG) PLACEMENT;  Surgeon: Jimmye Norman, MD;  Location: [facility] ENDOSCOPY;  Service: General;  Laterality: N/A;  . Abdominal surgery    . Aneurysm coiling    . Tracheostomy      in for   Chief Complaint  Patient presents with  . Shortness of Breath     HPI  Rachel Vang  is a 50 y.o. female, with past medical history of hypertension, recent lengthy hospitalization, secondary to intracranial hemorrhage, status post tracheostomy, PEG tube and chronic Foley, discharged on 02/14/2015 to SNF, EMS were called secondary to hypoxia, saturating in the 80s on 5 L trach collar, respiratory distress, patient was Ambu bag by EMS, in ED workup significant for fever 100.6, hypoxia, tachypnea, and leukocytosis, ABG showing no evidence of CO2 retention or hypoxia, urinalysis positive, sepsis  protocol was initiated, started on IV vancomycin and Zosyn, ED consulted critical care, who recommended hospitalist to admit to stepdown.    Review of Systems    Patient is noncommunicative, nonverbal, cannot provide any review of system.   Social History Social History  Substance Use Topics  . Smoking status: Former Games developer  . Smokeless tobacco: Never Used  . Alcohol Use: No    Family History Family history significant for hypertension and family.  Prior to Admission medications   Medication Sig Start Date End Date Taking? Authorizing Provider  Amino Acids-Protein Hydrolys (FEEDING SUPPLEMENT, PRO-STAT SUGAR FREE 64,) LIQD Place 30 mLs into feeding tube 2 (two) times daily. 02/11/15  Yes Leroy Sea, MD  bethanechol (URECHOLINE) 10 MG tablet Take 1 tablet (10 mg total) by mouth every 8 (eight) hours. 02/11/15  Yes Leroy Sea, MD  cloNIDine (CATAPRES) 0.1 MG tablet Take 1 tablet (0.1 mg total) by mouth 3 (three) times daily. 02/11/15  Yes Leroy Sea, MD  fentaNYL (DURAGESIC - DOSED MCG/HR) 25 MCG/HR patch Place 1 patch (25 mcg total) onto the skin every 3 (three) days. 02/11/15  Yes Leroy Sea, MD  insulin aspart (NOVOLOG) 100 UNIT/ML injection Inject 0-20 Units into the skin every 4 (four) hours. 02/11/15  Yes Leroy Sea, MD  insulin glargine (LANTUS) 100 UNIT/ML injection Inject 0.1 mLs (10 Units total) into the skin 2 (two) times daily. 02/11/15  Yes Leroy Sea, MD  levETIRAcetam (KEPPRA) 100 MG/ML solution  Place 5 mLs (500 mg total) into feeding tube 2 (two) times daily. 02/11/15  Yes Leroy Sea, MD  metoprolol tartrate (LOPRESSOR) 25 MG tablet Take 5 tablets (125 mg total) by mouth 2 (two) times daily. 02/11/15  Yes Leroy Sea, MD  Nutritional Supplements (FEEDING SUPPLEMENT, JEVITY 1.2 CAL,) LIQD Place 1,000 mLs into feeding tube continuous. 02/11/15  Yes Leroy Sea, MD  amLODipine (NORVASC) 10 MG tablet Take 1 tablet (10 mg total) by  mouth daily. Patient not taking: Reported on 02/17/2015 02/11/15   Leroy Sea, MD  antiseptic oral rinse (CPC / CETYLPYRIDINIUM CHLORIDE 0.05%) 0.05 % LIQD solution 7 mLs by Mouth Rinse route 2 times daily at 12 noon and 4 pm. Patient not taking: Reported on 02/17/2015 02/11/15   Leroy Sea, MD  docusate sodium (COLACE) 100 MG capsule Take 2 capsules (200 mg total) by mouth daily. Patient not taking: Reported on 02/17/2015 02/13/15   Leroy Sea, MD  famotidine (PEPCID) 40 MG/5ML suspension Place 2.5 mLs (20 mg total) into feeding tube 2 (two) times daily. Patient not taking: Reported on 02/17/2015 02/11/15   Leroy Sea, MD  lisinopril (PRINIVIL,ZESTRIL) 10 MG tablet Take 1 tablet (10 mg total) by mouth daily. Patient not taking: Reported on 02/17/2015 02/11/15   Leroy Sea, MD  simvastatin (ZOCOR) 20 MG tablet Place 1 tablet (20 mg total) into feeding tube daily at 6 PM. Patient not taking: Reported on 02/17/2015 02/11/15   Leroy Sea, MD  tamsulosin (FLOMAX) 0.4 MG CAPS capsule Take 1 capsule (0.4 mg total) by mouth daily. Patient not taking: Reported on 02/17/2015 02/12/15   Leroy Sea, MD    No Known Allergies  Physical Exam  Vitals  Blood pressure 132/83, pulse 116, temperature 100.6 F (38.1 C), temperature source Rectal, resp. rate 28, SpO2 100 %.   1. General , frail, chronically ill-appearing female lying in bed in NAD,    2. Patient is nonverbal, noncommunicative, does not follow command .  3. Tracheostomy, no bleeding or significant outside, and with staples in scalp area, from previous neurosurgery. Unable to perform neurological exam as patient does not follow command, will her toes and left lower extremity occasionally, otherwise nonambulatory.  4. Ears and Eyes appear Normal, Conjunctivae clear, . Moist Oral Mucosa.  5. Supple Neck, No JVD, tracheostomy +, size 4 non-cuffed, No cervical lymphadenopathy appriciated, No Carotid Bruits.  6.  Symmetrical Chest wall movement, diminished air entry at bases.  7. Tachycardic but regular, No Gallops, Rubs or Murmurs, No Parasternal Heave.  8. Positive Bowel Sounds, Abdomen Soft, No tenderness, No organomegaly appriciated , PEG +..  9.  No Cyanosis, Normal Skin Turgor, No Skin Rash or Bruise.  10. Good muscle tone,  joints appear normal , no effusions.  11. No Palpable Lymph Nodes in Neck or Axillae    Data Review  CBC  Recent Labs Lab 02/17/15 1518  WBC 15.9*  HGB 11.9*  HCT 37.4  PLT 250  MCV 97.1  MCH 30.9  MCHC 31.8  RDW 16.4*   ------------------------------------------------------------------------------------------------------------------  Chemistries   Recent Labs Lab 02/11/15 0455 02/17/15 1518  NA 144 142  K 3.7 4.4  CL 109 103  CO2 27 25  GLUCOSE 154* 201*  BUN 21* 22*  CREATININE 0.58 0.84  CALCIUM 9.2 9.8  AST 34 27  ALT 106* 57*  ALKPHOS 124 144*  BILITOT 0.5 0.1*   ------------------------------------------------------------------------------------------------------------------ estimated creatinine clearance is 78.9 mL/min (by  C-G formula based on Cr of 0.84). ------------------------------------------------------------------------------------------------------------------ No results for input(s): TSH, T4TOTAL, T3FREE, THYROIDAB in the last 72 hours.  Invalid input(s): FREET3   Coagulation profile  Recent Labs Lab 02/17/15 1636  INR 1.21   ------------------------------------------------------------------------------------------------------------------- No results for input(s): DDIMER in the last 72 hours. -------------------------------------------------------------------------------------------------------------------  Cardiac Enzymes No results for input(s): CKMB, TROPONINI, MYOGLOBIN in the last 168 hours.  Invalid input(s):  CK ------------------------------------------------------------------------------------------------------------------ Invalid input(s): POCBNP   ---------------------------------------------------------------------------------------------------------------  Urinalysis    Component Value Date/Time   COLORURINE AMBER* 02/17/2015 1607   APPEARANCEUR TURBID* 02/17/2015 1607   LABSPEC 1.023 02/17/2015 1607   PHURINE 7.5 02/17/2015 1607   GLUCOSEU NEGATIVE 02/17/2015 1607   HGBUR LARGE* 02/17/2015 1607   BILIRUBINUR NEGATIVE 02/17/2015 1607   KETONESUR NEGATIVE 02/17/2015 1607   PROTEINUR 100* 02/17/2015 1607   NITRITE POSITIVE* 02/17/2015 1607   LEUKOCYTESUR LARGE* 02/17/2015 1607    ----------------------------------------------------------------------------------------------------------------  Imaging results:   Dg Chest Portable 1 View  02/17/2015  CLINICAL DATA:  Shortness of breath.  Trauma.  Unresponsive patient. EXAM: PORTABLE CHEST 1 VIEW COMPARISON:  02/06/2015 FINDINGS: Tracheostomy tube projects along the tracheal air column. Low lung volumes are present, causing crowding of the pulmonary vasculature. Mild enlargement of the cardiopericardial silhouette, without edema. No discrete airspace opacity identified. No pneumothorax. IMPRESSION: 1. Low lung volumes but the lungs appear clear. Tracheostomy tube noted. 2. Mild enlargement of the cardiopericardial silhouette. Electronically Signed   By: Gaylyn Rong M.D.   On: 02/17/2015 15:40        Assessment & Plan  Principal Problem:   Sepsis (HCC) Active Problems:   ICH (intracerebral hemorrhage) (HCC)   Respiratory failure (HCC)   Diabetes mellitus (HCC)  Sepsis - Patient presents with fever, tachypnea, tachycardia, leukocytosis, sepsis protocol initiated, on IV fluids, started on broad-spectrum IV antibiotic vancomycin and Zosyn, chest x-ray with no evidence of pneumonia, check tracheal aspirate , CT chest  pending, urinalysis positive, patient with chronic indwelling Foley, will follow on urine culture, will change Foley catheter, blood cultures were sent, as elevated lactic acid, will trend. - Patient with history of C. difficile colitis, no diarrhea, abdominal is not on physical exam, monitor closely.  Intracranial hemorrhage - As an admission last December with intracranial hemorrhage, SCD for DVT prophylaxis, she is on tube feed, nursing order to discontinue staples from previous surgical site.  Chronic respiratory failure/tracheostomy dependent - Currently patient in no significant respiratory distress, ABG with no evidence of hypoxemia or hypercapnia, will admit to stepdown, respiratory care consulted for trach care, will check tracheal aspirate for possible tracheitis. - Continue with Keppra for seizure prophylaxis   Diabetes mellitus - Continue with insulin sliding scale every 4 hours  Hypertension - Continue with metoprolol, hold amlodipine and clonidine  DVT Prophylaxis SCD  AM Labs Ordered, also please review Full Orders  Family Communication: Admission, patients condition and plan of care including tests being ordered have been discussed with son and brother who indicate understanding and agree with the plan and Code Status.  Code Status Full  Likely DC to  SNF  Condition GUARDED    Time spent in minutes : 65 minutes    Tyiesha Brackney M.D on 02/17/2015 at 6:07 PM  Between 7am to 7pm - Pager - [phone]  After 7pm go to [email or web address] - password [email or web address]  And look for the night coverage person covering me after hours  [facility] Office  [phone]

## 2015-02-17 NOTE — Progress Notes (Signed)
ANTIBIOTIC CONSULT NOTE - INITIAL  Pharmacy Consult for Vancomycin and Zosyn Indication: sepsis  No Known Allergies  Patient Measurements:  Total Body Weight: 72.2 Ideal Body Weight: 57  Vital Signs: Temp: 100.6 F (38.1 C) (01/19 1555) Temp Source: Rectal (01/19 1555) BP: 132/83 mmHg (01/19 1745) Pulse Rate: 116 (01/19 1745) Intake/Output from previous day:   Intake/Output from this shift: Total I/O In: 2000 [I.V.:2000] Out: -   Labs:  Recent Labs  02/17/15 1518  WBC 15.9*  HGB 11.9*  PLT 250  CREATININE 0.84   Estimated Creatinine Clearance: 78.9 mL/min (by C-G formula based on Cr of 0.84). No results for input(s): VANCOTROUGH, VANCOPEAK, VANCORANDOM, GENTTROUGH, GENTPEAK, GENTRANDOM, TOBRATROUGH, TOBRAPEAK, TOBRARND, AMIKACINPEAK, AMIKACINTROU, AMIKACIN in the last 72 hours.   Microbiology: Recent Results (from the past 720 hour(s))  Culture, blood (routine x 2)     Status: None   Collection Time: 01/19/15  9:40 AM  Result Value Ref Range Status   Specimen Description BLOOD LEFT HAND  Final   Special Requests BOTTLES DRAWN AEROBIC ONLY 3CCS  Final   Culture  Setup Time   Final    GRAM POSITIVE COCCI IN CLUSTERS AEROBIC BOTTLE ONLY CRITICAL RESULT CALLED TO, READ BACK BY AND VERIFIED WITH: Patty Sermons RN 10:20 01/20/15 ([email or web address])    Culture   Final    STAPHYLOCOCCUS SPECIES (COAGULASE NEGATIVE) THE SIGNIFICANCE OF ISOLATING THIS ORGANISM FROM A SINGLE SET OF BLOOD CULTURES WHEN MULTIPLE SETS ARE DRAWN IS UNCERTAIN. PLEASE NOTIFY THE MICROBIOLOGY DEPARTMENT WITHIN ONE WEEK IF SPECIATION AND SENSITIVITIES ARE REQUIRED.    Report Status 01/23/2015 FINAL  Final  Culture, blood (routine x 2)     Status: None   Collection Time: 01/19/15  9:50 AM  Result Value Ref Range Status   Specimen Description BLOOD LEFT HAND  Final   Special Requests IN PEDIATRIC BOTTLE 2CCS  Final   Culture NO GROWTH 5 DAYS  Final   Report Status 01/24/2015 FINAL  Final  Culture,  respiratory (NON-Expectorated)     Status: None   Collection Time: 01/19/15  9:50 AM  Result Value Ref Range Status   Specimen Description ENDOTRACHEAL  Final   Special Requests NONE  Final   Gram Stain   Final    MODERATE WBC PRESENT, PREDOMINANTLY PMN NO SQUAMOUS EPITHELIAL CELLS SEEN ABUNDANT GRAM NEGATIVE COCCOBACILLI Performed at [facility]    Culture   Final    ABUNDANT HAEMOPHILUS INFLUENZAE Note: BETA LACTAMASE NEGATIVE Performed at [facility]    Report Status 01/21/2015 FINAL  Final  C difficile quick scan w PCR reflex     Status: None   Collection Time: 01/19/15  2:40 PM  Result Value Ref Range Status   C Diff antigen NEGATIVE NEGATIVE Final   C Diff toxin NEGATIVE NEGATIVE Final   C Diff interpretation Negative for toxigenic C. difficile  Final  Culture, Urine     Status: None   Collection Time: 02/01/15 10:49 AM  Result Value Ref Range Status   Specimen Description URINE, CATHETERIZED  Final   Special Requests NONE  Final   Culture >=100,000 COLONIES/mL ESCHERICHIA COLI  Final   Report Status 02/03/2015 FINAL  Final   Organism ID, Bacteria ESCHERICHIA COLI  Final      Susceptibility   Escherichia coli - MIC*    AMPICILLIN 4 SENSITIVE Sensitive     CEFAZOLIN <=4 SENSITIVE Sensitive     CEFTRIAXONE <=1 SENSITIVE Sensitive     CIPROFLOXACIN <=0.25 SENSITIVE  Sensitive     GENTAMICIN <=1 SENSITIVE Sensitive     IMIPENEM <=0.25 SENSITIVE Sensitive     NITROFURANTOIN <=16 SENSITIVE Sensitive     TRIMETH/SULFA <=20 SENSITIVE Sensitive     AMPICILLIN/SULBACTAM 4 SENSITIVE Sensitive     PIP/TAZO <=4 SENSITIVE Sensitive     * >=100,000 COLONIES/mL ESCHERICHIA COLI  Culture, respiratory (NON-Expectorated)     Status: None   Collection Time: 02/01/15 11:15 AM  Result Value Ref Range Status   Specimen Description TRACHEAL ASPIRATE  Final   Special Requests Normal  Final   Gram Stain   Final    ABUNDANT WBC PRESENT,BOTH PMN AND MONONUCLEAR FEW  SQUAMOUS EPITHELIAL CELLS PRESENT NO ORGANISMS SEEN Performed at [facility]    Culture   Final    NORMAL OROPHARYNGEAL FLORA Performed at [facility]    Report Status 02/03/2015 FINAL  Final  Culture, blood (Routine X 2) w Reflex to ID Panel     Status: None   Collection Time: 02/01/15 11:54 AM  Result Value Ref Range Status   Specimen Description BLOOD RIGHT HAND  Final   Special Requests BOTTLES DRAWN AEROBIC AND ANAEROBIC 5CCS  Final   Culture NO GROWTH 5 DAYS  Final   Report Status 02/06/2015 FINAL  Final  Culture, blood (Routine X 2) w Reflex to ID Panel     Status: None   Collection Time: 02/01/15 11:57 AM  Result Value Ref Range Status   Specimen Description BLOOD LEFT HAND  Final   Special Requests BOTTLES DRAWN AEROBIC ONLY 5CCS  Final   Culture NO GROWTH 5 DAYS  Final   Report Status 02/06/2015 FINAL  Final  C difficile quick scan w PCR reflex     Status: Abnormal   Collection Time: 02/05/15  9:45 AM  Result Value Ref Range Status   C Diff antigen POSITIVE (A) NEGATIVE Final   C Diff toxin POSITIVE (A) NEGATIVE Final   C Diff interpretation Positive for toxigenic C. difficile  Final    Comment: CRITICAL RESULT CALLED TO, READ BACK BY AND VERIFIED WITH: SPOKE TO RN A.SMITH @ 8:18 ON 02/06/2015 K.PEELE   Culture, blood (Routine X 2) w Reflex to ID Panel     Status: None   Collection Time: 02/05/15 11:25 AM  Result Value Ref Range Status   Specimen Description BLOOD LEFT HAND  Final   Special Requests IN PEDIATRIC BOTTLE 5CC  Final   Culture NO GROWTH 5 DAYS  Final   Report Status 02/10/2015 FINAL  Final  Culture, blood (Routine X 2) w Reflex to ID Panel     Status: None   Collection Time: 02/05/15 11:30 AM  Result Value Ref Range Status   Specimen Description BLOOD LEFT ANTECUBITAL  Final   Special Requests BOTTLES DRAWN AEROBIC AND ANAEROBIC 5CC  Final   Culture NO GROWTH 5 DAYS  Final   Report Status 02/10/2015 FINAL  Final  Culture, Urine      Status: None   Collection Time: 02/05/15 11:43 AM  Result Value Ref Range Status   Specimen Description URINE, CATHETERIZED  Final   Special Requests NONE  Final   Culture NO GROWTH 1 DAY  Final   Report Status 02/06/2015 FINAL  Final    Medical History: Past Medical History  Diagnosis Date  . Hypertension   . Acute respiratory failure (HCC)   . Epilepsy (HCC)   . GERD (gastroesophageal reflux disease)   . Nontraumatic subarachnoid hemorrhage (HCC)   .  Urinary retention   . Dysphagia   . Hyperlipidemia   . IBS (irritable bowel syndrome)   . Diabetes mellitus without complication (HCC)     Type 2, W/o complications    Medications:  Prescriptions prior to admission  Medication Sig Dispense Refill Last Dose  . Amino Acids-Protein Hydrolys (FEEDING SUPPLEMENT, PRO-STAT SUGAR FREE 64,) LIQD Place 30 mLs into feeding tube 2 (two) times daily. 900 mL 0 02/17/2015 at Unknown time  . bethanechol (URECHOLINE) 10 MG tablet Take 1 tablet (10 mg total) by mouth every 8 (eight) hours.   02/17/2015 at 0800  . cloNIDine (CATAPRES) 0.1 MG tablet Take 1 tablet (0.1 mg total) by mouth 3 (three) times daily. 60 tablet 11 02/17/2015 at 1000 am  . fentaNYL (DURAGESIC - DOSED MCG/HR) 25 MCG/HR patch Place 1 patch (25 mcg total) onto the skin every 3 (three) days. 5 patch 0 02/14/2015  . insulin aspart (NOVOLOG) 100 UNIT/ML injection Inject 0-20 Units into the skin every 4 (four) hours. 10 mL 11 02/17/2015 at Unknown time  . insulin glargine (LANTUS) 100 UNIT/ML injection Inject 0.1 mLs (10 Units total) into the skin 2 (two) times daily. 10 mL 11 02/17/2015 at 1000  . levETIRAcetam (KEPPRA) 100 MG/ML solution Place 5 mLs (500 mg total) into feeding tube 2 (two) times daily. 473 mL 12 02/17/2015 at Unknown time  . metoprolol tartrate (LOPRESSOR) 25 MG tablet Take 5 tablets (125 mg total) by mouth 2 (two) times daily.   02/17/2015 at 1000 am  . Nutritional Supplements (FEEDING SUPPLEMENT, JEVITY 1.2 CAL,) LIQD  Place 1,000 mLs into feeding tube continuous.  0 02/17/2015 at Unknown time  . amLODipine (NORVASC) 10 MG tablet Take 1 tablet (10 mg total) by mouth daily. (Patient not taking: Reported on 02/17/2015)   Not Taking at Unknown time  . antiseptic oral rinse (CPC / CETYLPYRIDINIUM CHLORIDE 0.05%) 0.05 % LIQD solution 7 mLs by Mouth Rinse route 2 times daily at 12 noon and 4 pm. (Patient not taking: Reported on 02/17/2015)  0 Not Taking at Unknown time  . docusate sodium (COLACE) 100 MG capsule Take 2 capsules (200 mg total) by mouth daily. (Patient not taking: Reported on 02/17/2015) 10 capsule 0 Not Taking at Unknown time  . famotidine (PEPCID) 40 MG/5ML suspension Place 2.5 mLs (20 mg total) into feeding tube 2 (two) times daily. (Patient not taking: Reported on 02/17/2015) 50 mL 0 Not Taking at Unknown time  . lisinopril (PRINIVIL,ZESTRIL) 10 MG tablet Take 1 tablet (10 mg total) by mouth daily. (Patient not taking: Reported on 02/17/2015)   Not Taking at Unknown time  . simvastatin (ZOCOR) 20 MG tablet Place 1 tablet (20 mg total) into feeding tube daily at 6 PM. (Patient not taking: Reported on 02/17/2015) 30 tablet  Not Taking at Unknown time  . tamsulosin (FLOMAX) 0.4 MG CAPS capsule Take 1 capsule (0.4 mg total) by mouth daily. (Patient not taking: Reported on 02/17/2015) 30 capsule  Not Taking at Unknown time   Scheduled:  . bethanechol  10 mg Oral 3 times per day  . feeding supplement (PRO-STAT SUGAR FREE 64)  30 mL Per Tube BID  . fentaNYL  25 mcg Transdermal Q72H  . insulin aspart  0-9 Units Subcutaneous 6 times per day  . levETIRAcetam  500 mg Per Tube BID  . metoprolol tartrate  125 mg Oral BID  . sodium chloride  3 mL Intravenous Q12H   Infusions:  . sodium chloride    .  feeding supplement (JEVITY 1.2 CAL)     Assessment: 49yo F w/ hx of HTN, recent lengthy hospitalization, secondary to intracranial hemorrhage, status post tracheostomy, PEG tube and chronic Foley, discharged on 02/14/15 to  SNF. EMS were called secondary to hypoxia, sat in 80s on 5L trach collar, respiratory distress, pt was Ambu bag by EMS. In ED presented w/ fever, hypoxia, tachypnea, and leukocytosis, ABG showed no evidence of CO2 retention, urinalysis (+). Pharmacy consulted to dose Vanc and Zosyn for sepsis. Afebrile, WBC 15.9, sCr 0.84, CrCl 78.9, LA 2.36>1.66, no cx results.  Pt received Vanc IV 1500mg  and Zosyn IV 3.375g one time doses in the ED.  1/19>>Vancomycin>> 1/19>>Zosyn>>  1/19 Blood(x2)>> 1/19 UC>> 1/19 Expectorated sputum>> 1/19 MRSA PCR>>  Goal of Therapy:  Vancomycin trough level 15-20 mcg/ml  Eradication of infection  Plan:  Vancomycin 1000mg  q12h Zosyn 3.375g IV q8h Measure antibiotic drug levels at steady state Follow up culture results, clinical course, abx tx, renal fxn  Charolette Child, PharmD Student   I agree with the assessment and plan.   Agapito Games, PharmD, BCPS Clinical Pharmacist Pager: [phone] 02/17/2015 9:07 PM

## 2015-02-17 NOTE — ED Provider Notes (Signed)
CSN: [id number]     Arrival date & time 02/17/15  1510 History   First MD Initiated Contact with Patient 02/17/15 1516     Chief Complaint  Patient presents with  . Shortness of Breath     (Consider location/radiation/quality/duration/timing/severity/associated sxs/prior Treatment) HPI   50 y F w PMH recent ICH, tracheostomy, dm, coming in with respiratory distress  Patient lives at a SNF and today was noticed to have increased WOB this afternoon.  EMS was called out and pt was satting in low 80's on 5L trach collar.  Patient was bagged by EMS and they initially felt resistance with bagging that improved on the way to the ED.  Patient is unable to offer any hx 2/2 her recent ICH but she is accompanied by her sons who say that she has had a little bit of cough for the past day.  Past Medical History  Diagnosis Date  . Hypertension   . Acute respiratory failure (HCC)   . Epilepsy (HCC)   . GERD (gastroesophageal reflux disease)   . Nontraumatic subarachnoid hemorrhage (HCC)   . Urinary retention   . Dysphagia   . Hyperlipidemia   . IBS (irritable bowel syndrome)   . Diabetes mellitus without complication (HCC)     Type 2, W/o complications   Past Surgical History  Procedure Laterality Date  . Radiology with anesthesia N/A 01/13/2015    Procedure: RADIOLOGY WITH ANESTHESIA;  Surgeon: Lisbeth Renshaw, MD;  Location: [facility] OR;  Service: Radiology;  Laterality: N/A;  . Esophagogastroduodenoscopy (egd) with propofol N/A 02/02/2015    Procedure: ESOPHAGOGASTRODUODENOSCOPY (EGD) WITH PROPOFOL;  Surgeon: Jimmye Norman, MD;  Location: [facility] ENDOSCOPY;  Service: General;  Laterality: N/A;  . Peg placement N/A 02/02/2015    Procedure: PERCUTANEOUS ENDOSCOPIC GASTROSTOMY (PEG) PLACEMENT;  Surgeon: Jimmye Norman, MD;  Location[facility] ENDOSCOPY;  Service: General;  Laterality: N/A;  . Abdominal surgery    . Aneurysm coiling    . Tracheostomy     No family history on file. Social History  Substance Use  Topics  . Smoking status: Former Games developer  . Smokeless tobacco: Never Used  . Alcohol Use: No   OB History    No data available     Review of Systems  Unable to perform ROS: Patient nonverbal      Allergies  Review of patient's allergies indicates no known allergies.  Home Medications   Prior to Admission medications   Medication Sig Start Date End Date Taking? Authorizing Provider  Amino Acids-Protein Hydrolys (FEEDING SUPPLEMENT, PRO-STAT SUGAR FREE 64,) LIQD Place 30 mLs into feeding tube 2 (two) times daily. 02/11/15  Yes Leroy Sea, MD  bethanechol (URECHOLINE) 10 MG tablet Take 1 tablet (10 mg total) by mouth every 8 (eight) hours. 02/11/15  Yes Leroy Sea, MD  cloNIDine (CATAPRES) 0.1 MG tablet Take 1 tablet (0.1 mg total) by mouth 3 (three) times daily. 02/11/15  Yes Leroy Sea, MD  fentaNYL (DURAGESIC - DOSED MCG/HR) 25 MCG/HR patch Place 1 patch (25 mcg total) onto the skin every 3 (three) days. 02/11/15  Yes Leroy Sea, MD  insulin aspart (NOVOLOG) 100 UNIT/ML injection Inject 0-20 Units into the skin every 4 (four) hours. 02/11/15  Yes Leroy Sea, MD  insulin glargine (LANTUS) 100 UNIT/ML injection Inject 0.1 mLs (10 Units total) into the skin 2 (two) times daily. 02/11/15  Yes Leroy Sea, MD  levETIRAcetam (KEPPRA) 100 MG/ML solution Place 5 mLs (500 mg total) into  feeding tube 2 (two) times daily. 02/11/15  Yes Leroy Sea, MD  metoprolol tartrate (LOPRESSOR) 25 MG tablet Take 5 tablets (125 mg total) by mouth 2 (two) times daily. 02/11/15  Yes Leroy Sea, MD  Nutritional Supplements (FEEDING SUPPLEMENT, JEVITY 1.2 CAL,) LIQD Place 1,000 mLs into feeding tube continuous. 02/11/15  Yes Leroy Sea, MD  amLODipine (NORVASC) 10 MG tablet Take 1 tablet (10 mg total) by mouth daily. Patient not taking: Reported on 02/17/2015 02/11/15   Leroy Sea, MD  antiseptic oral rinse (CPC / CETYLPYRIDINIUM CHLORIDE 0.05%) 0.05 % LIQD  solution 7 mLs by Mouth Rinse route 2 times daily at 12 noon and 4 pm. Patient not taking: Reported on 02/17/2015 02/11/15   Leroy Sea, MD  docusate sodium (COLACE) 100 MG capsule Take 2 capsules (200 mg total) by mouth daily. Patient not taking: Reported on 02/17/2015 02/13/15   Leroy Sea, MD  famotidine (PEPCID) 40 MG/5ML suspension Place 2.5 mLs (20 mg total) into feeding tube 2 (two) times daily. Patient not taking: Reported on 02/17/2015 02/11/15   Leroy Sea, MD  lisinopril (PRINIVIL,ZESTRIL) 10 MG tablet Take 1 tablet (10 mg total) by mouth daily. Patient not taking: Reported on 02/17/2015 02/11/15   Leroy Sea, MD  simvastatin (ZOCOR) 20 MG tablet Place 1 tablet (20 mg total) into feeding tube daily at 6 PM. Patient not taking: Reported on 02/17/2015 02/11/15   Leroy Sea, MD  tamsulosin (FLOMAX) 0.4 MG CAPS capsule Take 1 capsule (0.4 mg total) by mouth daily. Patient not taking: Reported on 02/17/2015 02/12/15   Leroy Sea, MD   BP 126/81 mmHg  Pulse 105  Temp(Src) 100.4 F (38 C) (Rectal)  Resp 27  SpO2 98%  LMP  (LMP Unknown) Physical Exam  HENT:  Head: Normocephalic and atraumatic.  Eyes: EOM are normal. Pupils are equal, round, and reactive to light.  Neck: Normal range of motion. Neck supple.  Trach in place  Cardiovascular: Normal rate and intact distal pulses.   Pulmonary/Chest: She is in respiratory distress.  Rhonchi bilaterally  Abdominal: Soft. There is no tenderness.  Musculoskeletal: Normal range of motion.  Neurological:  Eye open, will look around room  Skin: No rash noted. She is not diaphoretic.  Psychiatric: She has a normal mood and affect.    ED Course  Procedures (including critical care time) Labs Review Labs Reviewed  CBC - Abnormal; Notable for the following:    WBC 15.9 (*)    RBC 3.85 (*)    Hemoglobin 11.9 (*)    RDW 16.4 (*)    All other components within normal limits  COMPREHENSIVE METABOLIC PANEL -  Abnormal; Notable for the following:    Glucose, Bld 201 (*)    BUN 22 (*)    Albumin 2.9 (*)    ALT 57 (*)    Alkaline Phosphatase 144 (*)    Total Bilirubin 0.1 (*)    All other components within normal limits  URINALYSIS, ROUTINE W REFLEX MICROSCOPIC (NOT AT [facility]) - Abnormal; Notable for the following:    Color, Urine AMBER (*)    APPearance TURBID (*)    Hgb urine dipstick LARGE (*)    Protein, ur 100 (*)    Nitrite POSITIVE (*)    Leukocytes, UA LARGE (*)    All other components within normal limits  PROTIME-INR - Abnormal; Notable for the following:    Prothrombin Time 15.5 (*)    All  other components within normal limits  URINE MICROSCOPIC-ADD ON - Abnormal; Notable for the following:    Squamous Epithelial / LPF 0-5 (*)    Bacteria, UA MANY (*)    All other components within normal limits  GLUCOSE, CAPILLARY - Abnormal; Notable for the following:    Glucose-Capillary 120 (*)    All other components within normal limits  I-STAT CG4 LACTIC ACID, ED - Abnormal; Notable for the following:    Lactic Acid, Venous 2.36 (*)    All other components within normal limits  I-STAT ARTERIAL BLOOD GAS, ED - Abnormal; Notable for the following:    pH, Arterial 7.490 (*)    pO2, Arterial 150.0 (*)    Bicarbonate 27.4 (*)    Acid-Base Excess 4.0 (*)    All other components within normal limits  MRSA PCR SCREENING  CULTURE, BLOOD (ROUTINE X 2)  CULTURE, BLOOD (ROUTINE X 2)  URINE CULTURE  CULTURE, EXPECTORATED SPUTUM-ASSESSMENT  BRAIN NATRIURETIC PEPTIDE  CBC  BASIC METABOLIC PANEL  I-STAT TROPOININ, ED  I-STAT CG4 LACTIC ACID, ED  CG4 I-STAT (LACTIC ACID)  I-STAT CG4 LACTIC ACID, ED    Imaging Review Ct Angio Chest Pe W/cm &/or Wo Cm  02/17/2015  CLINICAL DATA:  50 year old female with shallow respirations and shortness of breath. Low oxygen saturations. EXAM: CT ANGIOGRAPHY CHEST WITH CONTRAST TECHNIQUE: Multidetector CT imaging of the chest was performed using the standard  protocol during bolus administration of intravenous contrast. Multiplanar CT image reconstructions and MIPs were obtained to evaluate the vascular anatomy. CONTRAST:  80mL OMNIPAQUE IOHEXOL 350 MG/ML SOLN COMPARISON:  No priors. FINDINGS: Mediastinum/Lymph Nodes: There are filling defects in the distal main pulmonary arteries bilaterally extending into lobar, segmental and subsegmental sized branches throughout most of the lungs. No saddle embolus is identified. Pulmonic trunk measures 2.8 cm in diameter. Right ventricle measures up to 3.2 cm in diameter. Left ventricle measures up to 4.1 cm in diameter. Mild cardiomegaly. Left ventricular concentric hypertrophy. There is no significant pericardial fluid, thickening or pericardial calcification. There is atherosclerosis of the thoracic aorta, the great vessels of the mediastinum and the coronary arteries, including calcified atherosclerotic plaque in the left anterior descending, left circumflex and right coronary arteries. No pathologically enlarged mediastinal or hilar lymph nodes. Esophagus is unremarkable in appearance. No axillary lymphadenopathy. Tracheostomy tube in position with tip terminating approximately 4.6 cm above the carina. Lungs/Pleura: Areas of dependent atelectasis are noted in lower lobes of the lungs bilaterally. No acute consolidative airspace disease. No pleural effusions. No definite suspicious appearing pulmonary nodules or masses. Upper Abdomen: Unremarkable. Musculoskeletal/Soft Tissues: There are no aggressive appearing lytic or blastic lesions noted in the visualized portions of the skeleton. Review of the MIP images confirms the above findings. IMPRESSION: 1. Extensive pulmonary emboli involving distal main pulmonary arteries extending into lobar, segmental and subsegmental sized branches throughout the lungs bilaterally. At this time, there are no overt findings to suggest right heart strain. 2. Cardiomegaly with left ventricular  concentric hypertrophy. 3. Dependent atelectasis throughout the lower lobes of the lungs bilaterally. Critical Value/emergent results were called by telephone at the time of interpretation on 02/17/2015 at 7:40 pm to Dr. Silas Flood, who verbally acknowledged these results. Electronically Signed   By: Trudie Reed M.D.   On: 02/17/2015 19:42   Dg Chest Portable 1 View  02/17/2015  CLINICAL DATA:  Shortness of breath.  Trauma.  Unresponsive patient. EXAM: PORTABLE CHEST 1 VIEW COMPARISON:  02/06/2015 FINDINGS: Tracheostomy tube projects along  the tracheal air column. Low lung volumes are present, causing crowding of the pulmonary vasculature. Mild enlargement of the cardiopericardial silhouette, without edema. No discrete airspace opacity identified. No pneumothorax. IMPRESSION: 1. Low lung volumes but the lungs appear clear. Tracheostomy tube noted. 2. Mild enlargement of the cardiopericardial silhouette. Electronically Signed   By: Gaylyn Rong M.D.   On: 02/17/2015 15:40   I have personally reviewed and evaluated these images and lab results as part of my medical decision-making.   EKG Interpretation   Date/Time:  Thursday February 17 2015 15:19:39 EST Ventricular Rate:  128 PR Interval:  143 QRS Duration: 83 QT Interval:  301 QTC Calculation: 439 R Axis:   60 Text Interpretation:  Sinus tachycardia Multiform ventricular premature  complexes LAE, consider biatrial enlargement Probable LVH with secondary  repol abnrm ST depr, consider ischemia, inferior leads no sig change from  previous. c/w rate related incresed ST depression. Confirmed by Donnald Garre,  MD, [facility] Conners [phone]) on 02/17/2015 4:04:35 PM      MDM   Final diagnoses:  1.  Sepsis (HCC) 2.  Multiple pulmonary emboli     56 y F w PMH recent ICH, tracheostomy, dm, coming in with respiratory distress  Exam as above.  Febrile, tachycardic, tachypnea, rhonchi.  Ddx:  Mucous plug, HCAP, PE  Will obtain cxr, blood gas,  cbc/cmp/lactic acid.  Will cover with vanc/zosyn.  Blood cx x2.  Ua/ucx. Will give 30 ml/kg NS  cxr unremarkable.  Labs sig for lactic acidosis.  Pt covered with vanc/zosyn for possible HCAP and will obtain cta pe study to eval for PE.  Patient able to be weaned down onto trach collar and WOB improved.  Will admit to step down for further eval.  After patient left ED, called by radiology regarding pe study which shows mult segmental pulmonary emboli.  I paged medicine to pass this information along.  The patient likely won't be able to be anticoagulated given recent ICH.  No acute events in the ED   Silas Flood, MD 02/18/15 0045  Arby Barrette, MD 02/21/15 [phone]

## 2015-02-17 NOTE — ED Notes (Signed)
RT suctioned patient to open airway

## 2015-02-17 NOTE — ED Notes (Addendum)
Per EMS, Patient is coming from [facility]. Was last seen normal at 1000. AT 1420, Pt was found with shallow respirations and SOB. Patient was on Trach Oxygen. Pt had saturations in 80s. When EMS arrived, utilized BVM to open up airway. Pt has been alert during treatment. When pt arrived Saturation was at 100% on BVM. Pt is alert. RT at the bedside upon arrival. Vitals per EMS: 148/100, 130 HR, 40 RR Shallow, 100% on BVM, no report of fever. Baseline is non-verbal and non-reactive. Pt is at baseline cognition per facility.

## 2015-02-18 ENCOUNTER — Encounter: Payer: Self-pay | Admitting: Internal Medicine

## 2015-02-18 ENCOUNTER — Inpatient Hospital Stay (HOSPITAL_COMMUNITY): Payer: Medicaid Other

## 2015-02-18 DIAGNOSIS — N390 Urinary tract infection, site not specified: Secondary | ICD-10-CM

## 2015-02-18 DIAGNOSIS — R778 Other specified abnormalities of plasma proteins: Secondary | ICD-10-CM | POA: Insufficient documentation

## 2015-02-18 DIAGNOSIS — A0472 Enterocolitis due to Clostridium difficile, not specified as recurrent: Secondary | ICD-10-CM | POA: Insufficient documentation

## 2015-02-18 DIAGNOSIS — I2699 Other pulmonary embolism without acute cor pulmonale: Secondary | ICD-10-CM

## 2015-02-18 DIAGNOSIS — R1310 Dysphagia, unspecified: Secondary | ICD-10-CM | POA: Insufficient documentation

## 2015-02-18 DIAGNOSIS — R569 Unspecified convulsions: Secondary | ICD-10-CM | POA: Insufficient documentation

## 2015-02-18 DIAGNOSIS — J40 Bronchitis, not specified as acute or chronic: Secondary | ICD-10-CM | POA: Insufficient documentation

## 2015-02-18 DIAGNOSIS — B963 Hemophilus influenzae [H. influenzae] as the cause of diseases classified elsewhere: Secondary | ICD-10-CM | POA: Insufficient documentation

## 2015-02-18 DIAGNOSIS — R7989 Other specified abnormal findings of blood chemistry: Secondary | ICD-10-CM

## 2015-02-18 DIAGNOSIS — R339 Retention of urine, unspecified: Secondary | ICD-10-CM | POA: Insufficient documentation

## 2015-02-18 DIAGNOSIS — I10 Essential (primary) hypertension: Secondary | ICD-10-CM | POA: Insufficient documentation

## 2015-02-18 DIAGNOSIS — B9620 Unspecified Escherichia coli [E. coli] as the cause of diseases classified elsewhere: Secondary | ICD-10-CM | POA: Insufficient documentation

## 2015-02-18 LAB — BASIC METABOLIC PANEL
Anion gap: 10 (ref 5–15)
BUN: 19 mg/dL (ref 6–20)
CALCIUM: 8.7 mg/dL — AB (ref 8.9–10.3)
CO2: 25 mmol/L (ref 22–32)
CREATININE: 0.67 mg/dL (ref 0.44–1.00)
Chloride: 109 mmol/L (ref 101–111)
GFR calc Af Amer: >60 mL/min (ref >60)
GFR calc non Af Amer: >60 mL/min (ref >60)
GLUCOSE: 126 mg/dL — AB (ref 65–99)
Potassium: 3.7 mmol/L (ref 3.5–5.1)
Sodium: 144 mmol/L (ref 135–145)

## 2015-02-18 LAB — CBC
HEMATOCRIT: 28.6 % — AB (ref 36.0–46.0)
Hemoglobin: 9 g/dL — ABNORMAL LOW (ref 12.0–15.0)
MCH: 30.6 pg (ref 26.0–34.0)
MCHC: 31.5 g/dL (ref 30.0–36.0)
MCV: 97.3 fL (ref 78.0–100.0)
PLATELETS: 218 10*3/uL (ref 150–400)
RBC: 2.94 MIL/uL — ABNORMAL LOW (ref 3.87–5.11)
RDW: 16.5 % — AB (ref 11.5–15.5)
WBC: 13.5 10*3/uL — ABNORMAL HIGH (ref 4.0–10.5)

## 2015-02-18 LAB — GLUCOSE, CAPILLARY
GLUCOSE-CAPILLARY: 114 mg/dL — AB (ref 65–99)
GLUCOSE-CAPILLARY: 127 mg/dL — AB (ref 65–99)
GLUCOSE-CAPILLARY: 138 mg/dL — AB (ref 65–99)
GLUCOSE-CAPILLARY: 156 mg/dL — AB (ref 65–99)
Glucose-Capillary: 120 mg/dL — ABNORMAL HIGH (ref 65–99)
Glucose-Capillary: 150 mg/dL — ABNORMAL HIGH (ref 65–99)
Glucose-Capillary: 149 mg/dL — ABNORMAL HIGH (ref 65–99)

## 2015-02-18 LAB — C DIFFICILE QUICK SCREEN W PCR REFLEX
C Diff antigen: NEGATIVE
C Diff interpretation: NEGATIVE
C Diff toxin: NEGATIVE

## 2015-02-18 LAB — HEPARIN LEVEL (UNFRACTIONATED): HEPARIN UNFRACTIONATED: 0.46 [IU]/mL (ref 0.30–0.70)

## 2015-02-18 MED ORDER — HEPARIN (PORCINE) IN NACL 100-0.45 UNIT/ML-% IJ SOLN
1150.0000 [IU]/h | INTRAMUSCULAR | Status: DC
Start: 2015-02-18 — End: 2015-02-19
  Administered 2015-02-18 (×2): 1150 [IU]/h via INTRAVENOUS
  Filled 2015-02-18 (×2): qty 250

## 2015-02-18 MED ORDER — HEPARIN BOLUS VIA INFUSION
4000.0000 [IU] | Freq: Once | INTRAVENOUS | Status: AC
  Administered 2015-02-18: 4000 [IU] via INTRAVENOUS
  Filled 2015-02-18: qty 4000

## 2015-02-18 NOTE — Progress Notes (Signed)
MRN: [id number] Name: Rachel Vang  Sex: female Age: 50 y.o. DOB: 03-16-1965  PSC #: Ronni Rumble Facility/Room: Level Of Care: SNF Provider: Merrilee Seashore D Emergency Contacts: Extended Emergency Contact Information Primary Emergency Contact: Pruitt,Carlton  [place] Mobile Phone: [phone] Relation: Son Secondary Emergency Contact: Debroah [place] Mobile Phone: [phone] Relation: Son  Code Status:   Allergies: Review of patient's allergies indicates no known allergies.  No chief complaint on file.   HPI: Patient is 50 y.o. female with HTN who was noncompliant with her medications, one pack-a-day smoker, daily beer drinker, who was shopping at [employer] and fell down, when she was brought to the ER she was found to have right-sided pupil dilation, in the ER workup was consistent with intracranial bleed with suspected aneurysm, she was intubated, she was admitted to the ICU.  Pt was admitted to [facility] from 12/15-1/16/2017.She was seen by neurosurgery and underwent before ACA aneurysm coiling, she also required IVC drain placement. She was subsequently treated, PEG tube was placed, trach placed. During her hospital stay she developed C. difficile colitis, influenza infection and Escherichia coli UTI. On d/c she remains unresponsive and only moves her extremities to painful stimuli. She is admitted to [facility] for skilled nursing care for her PEG, trach, unresposive status. While at [facility] pt will be fololwed for HTN, tx with norvasc, clonidine,, lisinopril and metoprolol, DM2 , tx with insulin and HLD, tx with zocor.   Past Medical History  Diagnosis Date  . Hypertension   . Acute respiratory failure (HCC)   . Epilepsy (HCC)   . GERD (gastroesophageal reflux disease)   . Nontraumatic subarachnoid hemorrhage (HCC)   . Urinary retention   . Dysphagia   . Hyperlipidemia   . IBS (irritable bowel syndrome)   . Diabetes mellitus without  complication (HCC)     Type 2, W/o complications    Past Surgical History  Procedure Laterality Date  . Radiology with anesthesia N/A 01/13/2015    Procedure: RADIOLOGY WITH ANESTHESIA;  Surgeon: Lisbeth Renshaw, MD;  Location: [facility] OR;  Service: Radiology;  Laterality: N/A;  . Esophagogastroduodenoscopy (egd) with propofol N/A 02/02/2015    Procedure: ESOPHAGOGASTRODUODENOSCOPY (EGD) WITH PROPOFOL;  Surgeon: Jimmye Norman, MD;  Location: [facility] ENDOSCOPY;  Service: General;  Laterality: N/A;  . Peg placement N/A 02/02/2015    Procedure: PERCUTANEOUS ENDOSCOPIC GASTROSTOMY (PEG) PLACEMENT;  Surgeon: Jimmye Norman, MD;  Location: [facility] ENDOSCOPY;  Service: General;  Laterality: N/A;  . Abdominal surgery    . Aneurysm coiling    . Tracheostomy        Medication List    Notice    This visit is during an admission. Changes to the med list made in this visit will be reflected in the After Visit Summary of the admission.     No current facility-administered medications on file prior to visit.   Current Outpatient Prescriptions on File Prior to Visit  Medication Sig Dispense Refill  . Amino Acids-Protein Hydrolys (FEEDING SUPPLEMENT, PRO-STAT SUGAR FREE 64,) LIQD Place 30 mLs into feeding tube 2 (two) times daily. 900 mL 0  . amLODipine (NORVASC) 10 MG tablet Take 1 tablet (10 mg total) by mouth daily. (Patient not taking: Reported on 02/17/2015)    . antiseptic oral rinse (CPC / CETYLPYRIDINIUM CHLORIDE 0.05%) 0.05 % LIQD solution 7 mLs by Mouth Rinse route 2 times daily at 12 noon and 4 pm. (Patient not taking: Reported on 02/17/2015)  0  . bethanechol (URECHOLINE)  10 MG tablet Take 1 tablet (10 mg total) by mouth every 8 (eight) hours.    . cloNIDine (CATAPRES) 0.1 MG tablet Take 1 tablet (0.1 mg total) by mouth 3 (three) times daily. 60 tablet 11  . docusate sodium (COLACE) 100 MG capsule Take 2 capsules (200 mg total) by mouth daily. (Patient not taking: Reported on 02/17/2015) 10 capsule 0  .  famotidine (PEPCID) 40 MG/5ML suspension Place 2.5 mLs (20 mg total) into feeding tube 2 (two) times daily. (Patient not taking: Reported on 02/17/2015) 50 mL 0  . fentaNYL (DURAGESIC - DOSED MCG/HR) 25 MCG/HR patch Place 1 patch (25 mcg total) onto the skin every 3 (three) days. 5 patch 0  . insulin aspart (NOVOLOG) 100 UNIT/ML injection Inject 0-20 Units into the skin every 4 (four) hours. 10 mL 11  . insulin glargine (LANTUS) 100 UNIT/ML injection Inject 0.1 mLs (10 Units total) into the skin 2 (two) times daily. 10 mL 11  . levETIRAcetam (KEPPRA) 100 MG/ML solution Place 5 mLs (500 mg total) into feeding tube 2 (two) times daily. 473 mL 12  . lisinopril (PRINIVIL,ZESTRIL) 10 MG tablet Take 1 tablet (10 mg total) by mouth daily. (Patient not taking: Reported on 02/17/2015)    . metoprolol tartrate (LOPRESSOR) 25 MG tablet Take 5 tablets (125 mg total) by mouth 2 (two) times daily.    . Nutritional Supplements (FEEDING SUPPLEMENT, JEVITY 1.2 CAL,) LIQD Place 1,000 mLs into feeding tube continuous.  0  . simvastatin (ZOCOR) 20 MG tablet Place 1 tablet (20 mg total) into feeding tube daily at 6 PM. (Patient not taking: Reported on 02/17/2015) 30 tablet   . tamsulosin (FLOMAX) 0.4 MG CAPS capsule Take 1 capsule (0.4 mg total) by mouth daily. (Patient not taking: Reported on 02/17/2015) 30 capsule      No orders of the defined types were placed in this encounter.     There is no immunization history on file for this patient.  Social History  Substance Use Topics  . Smoking status: Former Games developer  . Smokeless tobacco: Never Used  . Alcohol Use: No    Family history is + HTN, DM   Review of Systems UTO 2/2 minimally repsonsive state     Filed Vitals:   02/18/15 1052  BP: 95/66  Pulse: 102  Temp: 98.7 F (37.1 C)  Resp: 24    SpO2 Readings from Last 1 Encounters:  02/18/15 100%        Physical Exam  GENERAL APPEARANCE: eyes open No acute distress.  SKIN: No diaphoresis  rash HEAD: Normocephalic, atraumatic  EYES: Conjunctiva/lids clear. Pupils round, reactive. EOMs intact.  EARS: External exam WNL, canals clear. Hearing grossly normal.  NOSE: No deformity or discharge.  MOUTH/THROAT: Lips w/o lesions  RESPIRATORY: Breathing is even, unlabored. Lung sounds are mild rhonchi, trach   CARDIOVASCULAR: Heart RRR no murmurs, rubs or gallops. No peripheral edema.   GASTROINTESTINAL: Abdomen is soft, non-tender, not distended w/ normal bowel sounds, PEG GENITOURINARY: Bladder non tender, not distended  MUSCULOSKELETAL: No abnormal joints or musculature NEUROLOGIC:  Eyes open appeared to respond at least once to voice of family, head is leftward facing , flacid paralysis RUE,RLE, spontaneously moved L hand, has some tone in L leg PSYCHIATRIC: n/a  Patient Active Problem List   Diagnosis Date Noted  . Dysphagia 02/18/2015  . Enteritis due to Clostridium difficile 02/18/2015  . HTN (hypertension) 02/18/2015  . Hemophilus influenzae (H. influenzae) as the cause of diseases classified elsewhere  02/18/2015  . Tracheobronchitis 02/18/2015  . E. coli UTI 02/18/2015  . Seizures (HCC) 02/18/2015  . Elevated troponin 02/18/2015  . Urinary retention 02/18/2015  . Sepsis (HCC) 02/17/2015  . Diabetes mellitus (HCC) 02/17/2015  . History of ETT   . Hydrocephalus   . Tracheostomy in place [facility])   . Cerebral edema (HCC)   . Respiratory failure (HCC)   . Central line complication   . Encounter for central line care   . Encounter for feeding tube placement   . Neurological abnormality   . Subarachnoid hemorrhage (HCC)   . Acute respiratory failure with hypoxia and hypercapnia (HCC)   . Encounter for orogastric (OG) tube placement   . SAH (subarachnoid hemorrhage) (HCC)   . ICH (intracerebral hemorrhage) (HCC) 01/13/2015    CBC    Component Value Date/Time   WBC 13.5* 02/18/2015 0222   RBC 2.94* 02/18/2015 0222   HGB 9.0* 02/18/2015 0222   HCT 28.6* 02/18/2015  0222   PLT 218 02/18/2015 0222   MCV 97.3 02/18/2015 0222   LYMPHSABS 1.4 02/06/2015 0442   MONOABS 1.4* 02/06/2015 0442   EOSABS 0.0 02/06/2015 0442   BASOSABS 0.0 02/06/2015 0442    CMP     Component Value Date/Time   NA 144 02/18/2015 0222   K 3.7 02/18/2015 0222   CL 109 02/18/2015 0222   CO2 25 02/18/2015 0222   GLUCOSE 126* 02/18/2015 0222   BUN 19 02/18/2015 0222   CREATININE 0.67 02/18/2015 0222   CALCIUM 8.7* 02/18/2015 0222   PROT 7.4 02/17/2015 1518   ALBUMIN 2.9* 02/17/2015 1518   AST 27 02/17/2015 1518   ALT 57* 02/17/2015 1518   ALKPHOS 144* 02/17/2015 1518   BILITOT 0.1* 02/17/2015 1518   GFRNONAA >60 02/18/2015 0222   GFRAA >60 02/18/2015 0222    Lab Results  Component Value Date   HGBA1C 7.1* 02/08/2015     Ct Angio Chest Pe W/cm &/or Wo Cm  02/17/2015  CLINICAL DATA:  50 year old female with shallow respirations and shortness of breath. Low oxygen saturations. EXAM: CT ANGIOGRAPHY CHEST WITH CONTRAST TECHNIQUE: Multidetector CT imaging of the chest was performed using the standard protocol during bolus administration of intravenous contrast. Multiplanar CT image reconstructions and MIPs were obtained to evaluate the vascular anatomy. CONTRAST:  80mL OMNIPAQUE IOHEXOL 350 MG/ML SOLN COMPARISON:  No priors. FINDINGS: Mediastinum/Lymph Nodes: There are filling defects in the distal main pulmonary arteries bilaterally extending into lobar, segmental and subsegmental sized branches throughout most of the lungs. No saddle embolus is identified. Pulmonic trunk measures 2.8 cm in diameter. Right ventricle measures up to 3.2 cm in diameter. Left ventricle measures up to 4.1 cm in diameter. Mild cardiomegaly. Left ventricular concentric hypertrophy. There is no significant pericardial fluid, thickening or pericardial calcification. There is atherosclerosis of the thoracic aorta, the great vessels of the mediastinum and the coronary arteries, including calcified  atherosclerotic plaque in the left anterior descending, left circumflex and right coronary arteries. No pathologically enlarged mediastinal or hilar lymph nodes. Esophagus is unremarkable in appearance. No axillary lymphadenopathy. Tracheostomy tube in position with tip terminating approximately 4.6 cm above the carina. Lungs/Pleura: Areas of dependent atelectasis are noted in lower lobes of the lungs bilaterally. No acute consolidative airspace disease. No pleural effusions. No definite suspicious appearing pulmonary nodules or masses. Upper Abdomen: Unremarkable. Musculoskeletal/Soft Tissues: There are no aggressive appearing lytic or blastic lesions noted in the visualized portions of the skeleton. Review of the MIP images confirms the above  findings. IMPRESSION: 1. Extensive pulmonary emboli involving distal main pulmonary arteries extending into lobar, segmental and subsegmental sized branches throughout the lungs bilaterally. At this time, there are no overt findings to suggest right heart strain. 2. Cardiomegaly with left ventricular concentric hypertrophy. 3. Dependent atelectasis throughout the lower lobes of the lungs bilaterally. Critical Value/emergent results were called by telephone at the time of interpretation on 02/17/2015 at 7:40 pm to Dr. Silas Flood, who verbally acknowledged these results. Electronically Signed   By: Trudie Reed M.D.   On: 02/17/2015 19:42   Dg Chest Portable 1 View  02/17/2015  CLINICAL DATA:  Shortness of breath.  Trauma.  Unresponsive patient. EXAM: PORTABLE CHEST 1 VIEW COMPARISON:  02/06/2015 FINDINGS: Tracheostomy tube projects along the tracheal air column. Low lung volumes are present, causing crowding of the pulmonary vasculature. Mild enlargement of the cardiopericardial silhouette, without edema. No discrete airspace opacity identified. No pneumothorax. IMPRESSION: 1. Low lung volumes but the lungs appear clear. Tracheostomy tube noted. 2. Mild enlargement of  the cardiopericardial silhouette. Electronically Signed   By: Gaylyn Rong M.D.   On: 02/17/2015 15:40    Not all labs, radiology exams or other studies done during hospitalization come through on my EPIC note; however they are reviewed by me.    Assessment and Plan  SAH (subarachnoid hemorrhage) (HCC) Status post ACA aneurysm coiling and IVC drain placement (now out) . She remains minimally responsive and moves only to painful stimuli, she has trach and PEG. She is currently on pneumonia pain which will be stopped since 21 days are over, Decadron 1 mg IV twice a day which will be stopped, continue Keppra along with statin. I discussed this with neurosurgeon on call Dr. Conchita Paris on 02/08/2015. Continue supportive care will require placement.  Unfortunately she remains at very high risk for developing aspiration pneumonia, decubitus ulcer, urinary retention with UTI, unfortunately her mental status is not likely to improve much and neither is her quality of life. If declines further I have strongly encouraged family to focus on comfort care SNF - supportive care  ICH (intracerebral hemorrhage) (HCC) Status post ACA aneurysm coiling and IVC drain placement (now out) . She remains minimally responsive and moves only to painful stimuli, she has trach and PEG. She is currently on pneumonia pain which will be stopped since 21 days are over, Decadron 1 mg IV twice a day which will be stopped, continue Keppra along with statin. I discussed this with neurosurgeon on call Dr. Conchita Paris on 02/08/2015. Continue supportive care will require placement.  Unfortunately she remains at very high risk for developing aspiration pneumonia, decubitus ulcer, urinary retention with UTI, unfortunately her mental status is not likely to improve much and neither is her quality of life. If declines further I have strongly encouraged family to focus on comfort care SNF - supportive care  Dysphagia Unable to maintain  Airway , low functional state and extreme deconditioning- due to #1 above. Supportive care. Has tracheostomy with trach collar, PEG tube for tube feeds. SNF - cont PEG tube feeding  Enteritis due to Clostridium difficile As placed on oral vancomycin via PEG tube, diarrhea has completely resolved, now constipated and no BM since 02/11/2015. Stop vancomycin. Place on Colace SNF - monitor for reoccurence C Diff  HTN (hypertension) SNF - well controlled on Lopressor, lisinopril, clonidine and Norvasc  Tracheobronchitis 2/2 h Flu - Abx treatment finished  Hemophilus influenzae (H. influenzae) as the cause of diseases classified elsewhere Tracheobronchitis - abx tx  concluded  E. coli UTI Abx tx concluded prior to d/c  Seizures (HCC) Pt felt to have had a seizure pTA to hospital, has intracranial bleed;SNF- cont keppra 500 BID  Elevated troponin Likely demand ischemia (normal echo 01/14/15). On beta blocker and statin, cannot use aspirin due to bleed. Currently not a candidate for further workup.   Diabetes mellitus (HCC) Undiagnosed DM2 ; SNF -  SSI + Lantus, continue to monitor CBGs every 4 hours.  Urinary retention Failed Foley catheter removal trial, placed on Flomax, Foley replaced. SNF - cont foley;Outpatient urology follow-up in 1-2 weeks.   Acute respiratory failure with hypoxia and hypercapnia (HCC) 2/2 SAH, pt intubated and able to be extubated with now chronic respiratory failure with hypoxia SNF - cont trach, o2 and trach care   Time spent > 45 min;> 50% of time with patient was spent reviewing records, labs, tests and studies, counseling and developing plan of care  Margit Hanks, MD

## 2015-02-18 NOTE — Progress Notes (Signed)
MRN: [id number] Name: Rachel Vang  Sex: female Age: 50 y.o. DOB: January 21, 1966  PSC #: Ronni Rumble Facility/Room:130 Level Of Care: SNF Provider: Merrilee Seashore D Emergency Contacts: Extended Emergency Contact Information Primary Emergency Contact: Zarling,Carlton  [place] Mobile Phone: [phone] Relation: Son Secondary Emergency Contact: Debroah [place] Mobile Phone: [phone] Relation: Son  Code Status:   Allergies: Review of patient's allergies indicates no known allergies.  Chief Complaint  Patient presents with  . Acute Visit    HPI: Patient is 50 y.o. female recent SAH, PEG tube was placed, trach placed. During her hospital stay she developed C. difficile colitis, influenza infection and Escherichia coli UTI. On hospital d/c she remained unresponsive and only moved her extremities to painful stimuli. She was admitted to SNF for skilled nursing care for her PEG, trach, unresposive status. I was asked to see pt urgently for status change. Nursing walked into room to find pt in extreme respiratory distress, unchanged upon my arrival.. RR 40-50, maybe higher, extremely regular, accessory muscle use.. Presumed sudden change. HR 134, O2 sat 88% on 28% to trach.Skin warm, predict febrile, and diaphoretic. O2 via concentrator, can not be increased. BS [facility] called immediately. Suspect PE.  Past Medical History  Diagnosis Date  . Hypertension   . Acute respiratory failure (HCC)   . Epilepsy (HCC)   . GERD (gastroesophageal reflux disease)   . Nontraumatic subarachnoid hemorrhage (HCC)   . Urinary retention   . Dysphagia   . Hyperlipidemia   . IBS (irritable bowel syndrome)   . Diabetes mellitus without complication (HCC)     Type 2, W/o complications    Past Surgical History  Procedure Laterality Date  . Radiology with anesthesia N/A 01/13/2015    Procedure: RADIOLOGY WITH ANESTHESIA;  Surgeon: Lisbeth Renshaw, MD;   Location: [facility] OR;  Service: Radiology;  Laterality: N/A;  . Esophagogastroduodenoscopy (egd) with propofol N/A 02/02/2015    Procedure: ESOPHAGOGASTRODUODENOSCOPY (EGD) WITH PROPOFOL;  Surgeon: Jimmye Norman, MD;  Location: [facility] ENDOSCOPY;  Service: General;  Laterality: N/A;  . Peg placement N/A 02/02/2015    Procedure: PERCUTANEOUS ENDOSCOPIC GASTROSTOMY (PEG) PLACEMENT;  Surgeon: Jimmye Norman, MD;  Location: [facility] ENDOSCOPY;  Service: General;  Laterality: N/A;  . Abdominal surgery    . Aneurysm coiling    . Tracheostomy        Medication List    Notice    This visit is during an admission. Changes to the med list made in this visit will be reflected in the After Visit Summary of the admission.     Current Facility-Administered Medications on File Prior to Visit  Medication Dose Route Frequency Provider Last Rate Last Dose  . 0.9 %  sodium chloride infusion   Intravenous Continuous Starleen Arms, MD 75 mL/hr at 02/18/15 1203    . albuterol (PROVENTIL) (2.5 MG/3ML) 0.083% nebulizer solution 2.5 mg  2.5 mg Nebulization Q2H PRN Starleen Arms, MD      . bethanechol (URECHOLINE) tablet 10 mg  10 mg Oral 3 times per day Starleen Arms, MD   10 mg at 02/18/15 0600  . feeding supplement (JEVITY 1.2 CAL) liquid 1,000 mL  1,000 mL Per Tube Continuous Starleen Arms, MD 50 mL/hr at 02/18/15 0020 1,000 mL at 02/18/15 0020  . feeding supplement (PRO-STAT SUGAR FREE 64) liquid 30 mL  30 mL Per Tube BID Starleen Arms, MD   30 mL at 02/18/15 1021  . fentaNYL (DURAGESIC -  dosed mcg/hr) patch 25 mcg  25 mcg Transdermal Q72H Leana Roe Elgergawy, MD   25 mcg at 02/17/15 2223  . heparin ADULT infusion 100 units/mL (25000 units/250 mL)  1,150 Units/hr Intravenous Continuous Armandina Stammer, RPH 11.5 mL/hr at 02/18/15 0904 1,150 Units/hr at 02/18/15 0904  . insulin aspart (novoLOG) injection 0-9 Units  0-9 Units Subcutaneous 6 times per day Starleen Arms, MD   2 Units at 02/18/15 0850  .  levETIRAcetam (KEPPRA) 100 MG/ML solution 500 mg  500 mg Per Tube BID Starleen Arms, MD   500 mg at 02/18/15 1020  . metoprolol tartrate (LOPRESSOR) tablet 125 mg  125 mg Oral BID Starleen Arms, MD   125 mg at 02/18/15 1021  . piperacillin-tazobactam (ZOSYN) IVPB 3.375 g  3.375 g Intravenous 3 times per day Baldemar Friday, RPH   3.375 g at 02/18/15 0617  . sodium chloride 0.9 % injection 3 mL  3 mL Intravenous Q12H Starleen Arms, MD   3 mL at 02/18/15 1022  . vancomycin (VANCOCIN) IVPB 1000 mg/200 mL premix  1,000 mg Intravenous Q12H Darl Householder Masters, RPH   1,000 mg at 02/18/15 1610   Current Outpatient Prescriptions on File Prior to Visit  Medication Sig Dispense Refill  . Amino Acids-Protein Hydrolys (FEEDING SUPPLEMENT, PRO-STAT SUGAR FREE 64,) LIQD Place 30 mLs into feeding tube 2 (two) times daily. 900 mL 0  . amLODipine (NORVASC) 10 MG tablet Take 1 tablet (10 mg total) by mouth daily. (Patient not taking: Reported on 02/17/2015)    . antiseptic oral rinse (CPC / CETYLPYRIDINIUM CHLORIDE 0.05%) 0.05 % LIQD solution 7 mLs by Mouth Rinse route 2 times daily at 12 noon and 4 pm. (Patient not taking: Reported on 02/17/2015)  0  . bethanechol (URECHOLINE) 10 MG tablet Take 1 tablet (10 mg total) by mouth every 8 (eight) hours.    . cloNIDine (CATAPRES) 0.1 MG tablet Take 1 tablet (0.1 mg total) by mouth 3 (three) times daily. 60 tablet 11  . docusate sodium (COLACE) 100 MG capsule Take 2 capsules (200 mg total) by mouth daily. (Patient not taking: Reported on 02/17/2015) 10 capsule 0  . famotidine (PEPCID) 40 MG/5ML suspension Place 2.5 mLs (20 mg total) into feeding tube 2 (two) times daily. (Patient not taking: Reported on 02/17/2015) 50 mL 0  . fentaNYL (DURAGESIC - DOSED MCG/HR) 25 MCG/HR patch Place 1 patch (25 mcg total) onto the skin every 3 (three) days. 5 patch 0  . insulin aspart (NOVOLOG) 100 UNIT/ML injection Inject 0-20 Units into the skin every 4 (four) hours. 10 mL 11  .  insulin glargine (LANTUS) 100 UNIT/ML injection Inject 0.1 mLs (10 Units total) into the skin 2 (two) times daily. 10 mL 11  . levETIRAcetam (KEPPRA) 100 MG/ML solution Place 5 mLs (500 mg total) into feeding tube 2 (two) times daily. 473 mL 12  . lisinopril (PRINIVIL,ZESTRIL) 10 MG tablet Take 1 tablet (10 mg total) by mouth daily. (Patient not taking: Reported on 02/17/2015)    . metoprolol tartrate (LOPRESSOR) 25 MG tablet Take 5 tablets (125 mg total) by mouth 2 (two) times daily.    . Nutritional Supplements (FEEDING SUPPLEMENT, JEVITY 1.2 CAL,) LIQD Place 1,000 mLs into feeding tube continuous.  0  . simvastatin (ZOCOR) 20 MG tablet Place 1 tablet (20 mg total) into feeding tube daily at 6 PM. (Patient not taking: Reported on 02/17/2015) 30 tablet   . tamsulosin (FLOMAX) 0.4 MG CAPS capsule  Take 1 capsule (0.4 mg total) by mouth daily. (Patient not taking: Reported on 02/17/2015) 30 capsule      No orders of the defined types were placed in this encounter.     There is no immunization history on file for this patient.  Social History  Substance Use Topics  . Smoking status: Former Games developer  . Smokeless tobacco: Never Used  . Alcohol Use: No    Review of Systems  -Pt non verbal; nursing as per HPI    Filed Vitals:   02/18/15 1433  BP: 149/100  Pulse: 133  Resp: 40    Physical Exam  GENERAL APPEARANCE: eyes closed, extreme resp distress  SKIN: + diaphoresis, warm HEENT: Unremarkable RESPIRATORY: labored, rapid , rhonchi CARDIOVASCULAR: Heart reg, very rapid. No peripheral edema  GASTROINTESTINAL: Abdomen is soft, non-tender, not distended w/ normal bowel sounds;PEG.  GENITOURINARY: Bladder non tender, not distended  MUSCULOSKELETAL: No abnormal joints or musculature NEUROLOGIC: unresponsive, did not move except to breath PSYCHIATRIC: n/a  Patient Active Problem List   Diagnosis Date Noted  . Dysphagia 02/18/2015  . Enteritis due to Clostridium difficile 02/18/2015  .  HTN (hypertension) 02/18/2015  . Hemophilus influenzae (H. influenzae) as the cause of diseases classified elsewhere 02/18/2015  . Tracheobronchitis 02/18/2015  . E. coli UTI 02/18/2015  . Seizures (HCC) 02/18/2015  . Elevated troponin 02/18/2015  . Urinary retention 02/18/2015  . Sepsis (HCC) 02/17/2015  . Diabetes mellitus (HCC) 02/17/2015  . History of ETT   . Hydrocephalus   . Tracheostomy in place [employer])   . Cerebral edema (HCC)   . Acute and chronic respiratory failure with hypoxia (HCC)   . Central line complication   . Encounter for central line care   . Encounter for feeding tube placement   . Neurological abnormality   . Subarachnoid hemorrhage (HCC)   . Acute respiratory failure with hypoxia and hypercapnia (HCC)   . Encounter for orogastric (OG) tube placement   . SAH (subarachnoid hemorrhage) (HCC)   . ICH (intracerebral hemorrhage) (HCC) 01/13/2015    CBC    Component Value Date/Time   WBC 13.5* 02/18/2015 0222   RBC 2.94* 02/18/2015 0222   HGB 9.0* 02/18/2015 0222   HCT 28.6* 02/18/2015 0222   PLT 218 02/18/2015 0222   MCV 97.3 02/18/2015 0222   LYMPHSABS 1.4 02/06/2015 0442   MONOABS 1.4* 02/06/2015 0442   EOSABS 0.0 02/06/2015 0442   BASOSABS 0.0 02/06/2015 0442    CMP     Component Value Date/Time   NA 144 02/18/2015 0222   K 3.7 02/18/2015 0222   CL 109 02/18/2015 0222   CO2 25 02/18/2015 0222   GLUCOSE 126* 02/18/2015 0222   BUN 19 02/18/2015 0222   CREATININE 0.67 02/18/2015 0222   CALCIUM 8.7* 02/18/2015 0222   PROT 7.4 02/17/2015 1518   ALBUMIN 2.9* 02/17/2015 1518   AST 27 02/17/2015 1518   ALT 57* 02/17/2015 1518   ALKPHOS 144* 02/17/2015 1518   BILITOT 0.1* 02/17/2015 1518   GFRNONAA >60 02/18/2015 0222   GFRAA >60 02/18/2015 0222    Assessment and Plan  Acute and chronic respiratory failure with hypoxia (HCC) Presumed sudden, suspect PE. Aspiration PNA always possibility, pt appears febrile. Of course, immediate send to  hospital.   Time spent > 35 min;> 50% of time with patient was spent reviewing records, labs, tests and studies, counseling and developing plan of care  Margit Hanks, MD

## 2015-02-18 NOTE — Assessment & Plan Note (Signed)
Failed Foley catheter removal trial, placed on Flomax, Foley replaced. SNF - cont foley;Outpatient urology follow-up in 1-2 weeks.

## 2015-02-18 NOTE — Assessment & Plan Note (Signed)
Unable to maintain Airway , low functional state and extreme deconditioning- due to #1 above. Supportive care. Has tracheostomy with trach collar, PEG tube for tube feeds. SNF - cont PEG tube feeding

## 2015-02-18 NOTE — Assessment & Plan Note (Signed)
Likely demand ischemia (normal echo 01/14/15). On beta blocker and statin, cannot use aspirin due to bleed. Currently not a candidate for further workup.

## 2015-02-18 NOTE — Progress Notes (Signed)
ANTICOAGULATION CONSULT NOTE - Initial Consult  Pharmacy Consult for heparin Indication: pulmonary embolus  No Known Allergies  Patient Measurements: Height:  (165.1 cm) Weight: 152 lb 5.4 oz (69.1 kg) IBW/kg (Calculated) : 57  Vital Signs: Temp: 98.9 F (37.2 C) (01/20 0700) Temp Source: Axillary (01/20 0700) BP: 104/77 mmHg (01/20 0700) Pulse Rate: 102 (01/20 0700)  Labs:  Recent Labs  02/17/15 1518 02/17/15 1636 02/18/15 0222  HGB 11.9*  --  9.0*  HCT 37.4  --  28.6*  PLT 250  --  218  LABPROT  --  15.5*  --   INR  --  1.21  --   CREATININE 0.84  --  0.67    Estimated Creatinine Clearance: 81.2 mL/min (by C-G formula based on Cr of 0.67).   Medical History: Past Medical History  Diagnosis Date  . Hypertension   . Acute respiratory failure (HCC)   . Epilepsy (HCC)   . GERD (gastroesophageal reflux disease)   . Nontraumatic subarachnoid hemorrhage (HCC)   . Urinary retention   . Dysphagia   . Hyperlipidemia   . IBS (irritable bowel syndrome)   . Diabetes mellitus without complication (HCC)     Type 2, W/o complications    Assessment: 50 yo F presents on 1/19 secondary to hypoxia. CT of chest shows extensive PE. Pharmacy consulted to start heparin. No anticoag PTA. Hgb low at 9.0, plts wnl. No s/s of bleed.  Goal of Therapy:  Heparin level 0.3-0.7 units/ml Monitor platelets by anticoagulation protocol: Yes   Plan:  Give 4,000 unit heparin BOLUS Start heparin gtt at 1,150 units/hr Check 6 hr HL Monitor daily HL, CBC, s/s of bleed  Enzo Bi, PharmD, [facility] Clinical Pharmacist Pager [phone] 02/18/2015 7:47 AM

## 2015-02-18 NOTE — Assessment & Plan Note (Signed)
Pt felt to have had a seizure pTA to hospital, has intracranial bleed;SNF- cont keppra 500 BID

## 2015-02-18 NOTE — Assessment & Plan Note (Signed)
Status post ACA aneurysm coiling and IVC drain placement (now out) . She remains minimally responsive and moves only to painful stimuli, she has trach and PEG. She is currently on pneumonia pain which will be stopped since 21 days are over, Decadron 1 mg IV twice a day which will be stopped, continue Keppra along with statin. I discussed this with neurosurgeon on call Dr. Conchita Paris on 02/08/2015. Continue supportive care will require placement.  Unfortunately she remains at very high risk for developing aspiration pneumonia, decubitus ulcer, urinary retention with UTI, unfortunately her mental status is not likely to improve much and neither is her quality of life. If declines further I have strongly encouraged family to focus on comfort care SNF - supportive care

## 2015-02-18 NOTE — Progress Notes (Signed)
Patient Demographics  Rachel Vang, is a 50 y.o. female, DOB - October 26, 1965, ZOX:[id number]  Admit date - 02/17/2015   Admitting Physician Rachel Arms, MD  Outpatient Primary MD for the patient is No primary care provider on file.  LOS - 1   Chief Complaint  Patient presents with  . Shortness of Breath       Admission HPI/Brief narrative: 50 y.o. female, with past medical history of hypertension, recent lengthy hospitalization, secondary to intracranial hemorrhage, status post tracheostomy, PEG tube and chronic Foley, discharged on 02/14/2015 to SNF, presents with respiratory distress, CTA chest significant for extensive bilateral PE, as well sepsis secondary to UTI.  Subjective:   Rachel Vang today on communicative, can't provide any complaints . Assessment & Plan    Principal Problem:   Sepsis (HCC) Active Problems:   ICH (intracerebral hemorrhage) (HCC)   Respiratory failure (HCC)   Diabetes mellitus (HCC)  Sepsis  - Presents with fever, tachypnea, tachycardia and leukocytosis  - Most likely related to UTI , follow on urine cultures. - follow on Blood cultures, tracheal aspirate . - On broad-spectrum antibiotics vancomycin and Zosyn, would discontinue vancomycin in 24 hours if cultures remain negative.  Chronic respiratory failure/tracheostomy dependent - Oxygen demand at baseline, remains on aerosolized trach collar. - Continue with tracheostomy care, when necessary nebulizers.  Pulmonary embolism/left lower extremity DVT - CTA chest significant for extensive bilateral pulmonary embolism extending into lobar, segmental and subsegmental sinus branches throughout the lungs bilaterally. - Patient with recent intracranial hemorrhage, this was discussed with her primary neurosurgeon Dr. 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 - This was discussed with patient sister, explained risks versus benefits, and she is agreeable to plan. - Venous Doppler significant for left lower extremity DVT  Recent intracranial hemorrhage - Continue with supportive care. - Continue with tube feed - Remove staples from surgical site  Diabetes mellitus - Continue with insulin sliding scale every 4 hours  Hypertension - Continue with metoprolol, hold amlodipine and clonidine   Code Status: Full  Family Communication: spoke with sister Ms Rachel Vang via phone  Disposition Plan:back to SNF when stable.   Procedures  none   Consults   none   Medications  Scheduled Meds: . bethanechol  10 mg Oral 3 times per day  . feeding supplement (PRO-STAT SUGAR FREE 64)  30 mL Per Tube BID  . fentaNYL  25 mcg Transdermal Q72H  . insulin aspart  0-9 Units Subcutaneous 6 times per day  . levETIRAcetam  500 mg Per Tube BID  . metoprolol tartrate  125 mg Oral BID  . piperacillin-tazobactam (ZOSYN)  IV  3.375 g Intravenous 3 times per day  . sodium chloride  3 mL Intravenous Q12H  . vancomycin  1,000 mg Intravenous Q12H   Continuous Infusions: . sodium chloride 75 mL/hr at 02/17/15 2222  . feeding supplement (JEVITY 1.2 CAL) 1,000 mL (02/18/15 0020)  . heparin 1,150 Units/hr (02/18/15 0904)   PRN Meds:.albuterol  DVT Prophylaxis on heparin GTT  Lab Results  Component Value Date   PLT 218 02/18/2015    Antibiotics    Anti-infectives  Start     Dose/Rate Route Frequency Ordered Stop   02/18/15 0400  vancomycin (VANCOCIN) IVPB 1000 mg/200 mL premix     1,000 mg 200 mL/hr over 60 Minutes Intravenous Every 12 hours 02/17/15 2108     02/17/15 2200  piperacillin-tazobactam (ZOSYN) IVPB 3.375 g     3.375 g 12.5 mL/hr over 240 Minutes Intravenous 3 times per day 02/17/15 2108     02/17/15 1530  vancomycin (VANCOCIN) 1,500 mg in sodium chloride 0.9 % 500 mL IVPB      1,500 mg 250 mL/hr over 120 Minutes Intravenous  Once 02/17/15 1518 02/17/15 1803   02/17/15 1530  piperacillin-tazobactam (ZOSYN) IVPB 3.375 g     3.375 g 100 mL/hr over 30 Minutes Intravenous  Once 02/17/15 1518 02/17/15 1600          Objective:   Filed Vitals:   02/18/15 0326 02/18/15 0400 02/18/15 0700 02/18/15 0840  BP:  141/78 104/77 110/68  Pulse: 97 98 102   Temp:  99.5 F (37.5 C) 98.9 F (37.2 C)   TempSrc:  Axillary Axillary   Resp: Height:      Weight:      SpO2: 99% 97% 98%     Wt Readings from Last 3 Encounters:  02/17/15 69.1 kg (152 lb 5.4 oz)  02/14/15 72.2 kg (159 lb 2.8 oz)     Intake/Output Summary (Last 24 hours) at 02/18/15 1044 Last data filed at 02/18/15 0743  Gross per 24 hour  Intake 2855.83 ml  Output   1900 ml  Net 955.83 ml     Physical Exam Laying in bed in no apparent distress, appears comfortable  Supple Neck,No JVD,tracheostomy +, site looks clean  Symmetrical Chest wall movement, Good air movement bilaterally, no wheezing. RRR,No Gallops,Rubs or new Murmurs, No Parasternal Heave +ve B.Sounds, Abd Soft, No tenderness, PEG +,No rebound - guarding or rigidity. No Cyanosis, Clubbing or edema, No new Rash or bruise     Data Review   Micro Results Recent Results (from the past 240 hour(s))  MRSA PCR Screening     Status: None   Collection Time: 02/17/15  8:13 PM  Result Value Ref Range Status   MRSA by PCR NEGATIVE NEGATIVE Final    Comment:        The GeneXpert MRSA Assay (FDA approved for NASAL specimens only), is one component of a comprehensive MRSA colonization surveillance program. It is not intended to diagnose MRSA infection nor to guide or monitor treatment for MRSA infections.     Radiology Reports Ct Head Wo Contrast  02/04/2015  CLINICAL DATA:  Followup scan.  EVD clamping for 48 hours. EXAM: CT HEAD WITHOUT CONTRAST TECHNIQUE: Contiguous axial images were obtained from the base of the skull  through the vertex without intravenous contrast. COMPARISON:  02/02/2015 FINDINGS: Ventriculostomy catheter position is stable, tip near the foramina [place]. Persistent frothy secretions and mucosal thickening in the sphenoid sinuses in this patient with previous nasal intubation. No new calvarial or orbital findings. 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 There is mild lateral ventriculomegaly, best seen at the temporal horns, which is stable. No third or fourth ventricular enlargement. Scattered subarachnoid hemorrhage which is much improved compared to admission and stable from most recent scan. An ovoid hematoma along the anterior interhemispheric fissure is stable in size and density is fading. Stable low-density in  the bilateral inferior frontal lobes. No evidence of acute infarct. IMPRESSION: 1. Stable mild lateral ventriculomegaly. 2. Stable subarachnoid hemorrhage compared to 2 days ago. 3. Sphenoid sinusitis Electronically Signed   By: Marnee Spring M.D.   On: 02/04/2015 15:47   Ct Head Wo Contrast  02/02/2015  CLINICAL DATA:  Cerebral edema.  Aneurysm coiling. EXAM: CT HEAD WITHOUT CONTRAST TECHNIQUE: Contiguous axial images were obtained from the base of the skull through the vertex without intravenous contrast. 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 Coil remains in good position. Mild diffuse high-density subarachnoid hemorrhage is unchanged. No new hemorrhage. Right frontal ventricular catheter tip at the foramina [place] unchanged. Ventricles are slightly larger compared with the prior study. Negative for acute infarct.  No shift of the midline structures. Air-fluid level in the sphenoid sinus bilaterally similar to the prior study. IMPRESSION: Anterior communicating artery aneurysm  coiling with resolving diffuse subarachnoid hemorrhage unchanged. No new hemorrhage. Ventricular catheter remains in good position. Mild ventricular dilatation compared with the recent CT. Electronically Signed   By: Marlan Palau M.D.   On: 02/02/2015 12:29   Ct Head Wo Contrast  01/28/2015  CLINICAL DATA:  Followup acute subarachnoid hemorrhage and coiling of anterior cerebral artery aneurysm EXAM: CT HEAD WITHOUT CONTRAST TECHNIQUE: Contiguous axial images were obtained from the base of the skull through the vertex without intravenous contrast. COMPARISON:  01/13/2015 FINDINGS: Ventriculostomy from a right frontal approach enters the frontal horn of the right lateral ventricle in has its tip in the region of the foramen of [place]. Ventricles are mildly larger than were seen on the presenting scan. Coils are present at the location of the anterior communicating artery aneurysm. Anterior interhemispheric blood collide is becoming smaller and less dense. Diffuse subarachnoid hemorrhage is becoming less dense. No definable hyper acute hemorrhage. No convexity subdural or effusion. Layering fluid is present in the sphenoid sinus. No CT evidence of ischemic infarction. IMPRESSION: Previous coiling of anterior communicating artery aneurysm. Right frontal ventriculostomy. Ventricular size is slightly larger than on the presenting examination. Diminishing density of diffuse subarachnoid blood and and interhemispheric hematoma anteriorly. Electronically Signed   By: Paulina Fusi M.D.   On: 01/28/2015 07:30   Ct Angio Chest Pe W/cm &/or Wo Cm  02/17/2015  CLINICAL DATA:  50 year old female with shallow respirations and shortness of breath. Low oxygen saturations. EXAM: CT ANGIOGRAPHY CHEST WITH CONTRAST TECHNIQUE: Multidetector CT imaging of the chest was performed using the standard protocol during bolus administration of intravenous contrast. Multiplanar CT image reconstructions and MIPs were obtained to evaluate  the vascular anatomy. CONTRAST:  80mL OMNIPAQUE IOHEXOL 350 MG/ML SOLN COMPARISON:  No priors. FINDINGS: Mediastinum/Lymph Nodes: There are filling defects in the distal main pulmonary arteries bilaterally extending into lobar, segmental and subsegmental sized branches throughout most of the lungs. No saddle embolus is identified. Pulmonic trunk measures 2.8 cm in diameter. Right ventricle measures up to 3.2 cm in diameter. Left ventricle measures up to 4.1 cm in diameter. Mild cardiomegaly. Left ventricular concentric hypertrophy. There is no significant pericardial fluid, thickening or pericardial calcification. There is atherosclerosis of the thoracic aorta, the great vessels of the mediastinum and the coronary arteries, including calcified atherosclerotic plaque in the left anterior descending, left circumflex and right coronary arteries. No pathologically enlarged mediastinal or hilar lymph nodes. Esophagus is unremarkable in appearance. No axillary lymphadenopathy. Tracheostomy tube in position with tip terminating approximately 4.6 cm above the carina. Lungs/Pleura: Areas  of dependent atelectasis are noted in lower lobes of the lungs bilaterally. No acute consolidative airspace disease. No pleural effusions. No definite suspicious appearing pulmonary nodules or masses. Upper Abdomen: Unremarkable. Musculoskeletal/Soft Tissues: There are no aggressive appearing lytic or blastic lesions noted in the visualized portions of the skeleton. Review of the MIP images confirms the above findings. IMPRESSION: 1. Extensive pulmonary emboli involving distal main pulmonary arteries extending into lobar, segmental and subsegmental sized branches throughout the lungs bilaterally. At this time, there are no overt findings to suggest right heart strain. 2. Cardiomegaly with left ventricular concentric hypertrophy. 3. Dependent atelectasis throughout the lower lobes of the lungs bilaterally. Critical Value/emergent results were  called by telephone at the time of interpretation on 02/17/2015 at 7:40 pm to Dr. Silas Flood, who verbally acknowledged these results. Electronically Signed   By: Trudie Reed M.D.   On: 02/17/2015 19:42   Dg Chest Portable 1 View  02/17/2015  CLINICAL DATA:  Shortness of breath.  Trauma.  Unresponsive patient. EXAM: PORTABLE CHEST 1 VIEW COMPARISON:  02/06/2015 FINDINGS: Tracheostomy tube projects along the tracheal air column. Low lung volumes are present, causing crowding of the pulmonary vasculature. Mild enlargement of the cardiopericardial silhouette, without edema. No discrete airspace opacity identified. No pneumothorax. IMPRESSION: 1. Low lung volumes but the lungs appear clear. Tracheostomy tube noted. 2. Mild enlargement of the cardiopericardial silhouette. Electronically Signed   By: Gaylyn Rong M.D.   On: 02/17/2015 15:40   Dg Chest Port 1 View  02/06/2015  CLINICAL DATA:  Patient with history of fever. EXAM: PORTABLE CHEST 1 VIEW COMPARISON:  Chest radiograph 02/05/2015. FINDINGS: Tracheostomy tube terminates the mid trachea. Multiple monitoring leads overlie the patient. Stable enlarged cardiac and mediastinal contours. Elevation of the right hemidiaphragm. Unchanged heterogeneous opacities left lung base. No pleural effusion or pneumothorax. IMPRESSION: Unchanged retrocardiac opacity which may represent atelectasis or infection. Electronically Signed   By: Annia Belt M.D.   On: 02/06/2015 08:51   Dg Chest Port 1 View  02/05/2015  CLINICAL DATA:  50 year old female with fever EXAM: PORTABLE CHEST 1 VIEW COMPARISON:  Prior chest x-ray obtained earlier today at 6 a.m. FINDINGS: Tracheostomy tube remains in stable position. Unchanged cardiomegaly. Unchanged bilateral retrocardiac airspace opacities with a linear configuration slightly more prominent on the left than the right remains unchanged. No acute osseous abnormality. IMPRESSION: No interval change in the appearance of the chest  compared to earlier this morning. Persistent left greater than right basilar atelectasis. Electronically Signed   By: Malachy Moan M.D.   On: 02/05/2015 08:55   Dg Chest Port 1 View  02/05/2015  CLINICAL DATA:  Respiratory failure EXAM: PORTABLE CHEST 1 VIEW COMPARISON:  02/01/2015 chest radiograph. FINDINGS: Tracheostomy tube tip overlies the tracheal air column 3.5 cm above the carina. Stable cardiomediastinal silhouette with mild cardiomegaly. No pneumothorax. No pleural effusion. Stable mild platelike atelectasis at the left lung base. No pulmonary edema. IMPRESSION: Well-positioned tracheostomy tube. Stable mild cardiomegaly without overt pulmonary edema. Stable mild platelike atelectasis at the left lung base. Electronically Signed   By: Delbert Phenix M.D.   On: 02/05/2015 07:42   Dg Chest Port 1 View  02/01/2015  CLINICAL DATA:  Respiratory failure. EXAM: PORTABLE CHEST 1 VIEW COMPARISON:  January 28, 2015. FINDINGS: Stable cardiomediastinal silhouette. No pneumothorax or pleural effusion is noted. Tracheostomy tube is unchanged in position. Distal tip of feeding tube is seen in expected position of distal stomach. Stable position of right internal jugular catheter line  with distal tip in expected position of upper right atrium. Both lungs are clear. The visualized skeletal structures are unremarkable. IMPRESSION: Stable support apparatus. No acute cardiopulmonary abnormality seen. Electronically Signed   By: Lupita Raider, M.D.   On: 02/01/2015 12:21   Dg Chest Port 1 View  01/28/2015  CLINICAL DATA:  Respiratory failure. EXAM: PORTABLE CHEST 1 VIEW COMPARISON:  01/27/2015 FINDINGS: Again noted is a tracheostomy tube. Jugular central line tip is in the upper right atrium. Feeding tube extends into the abdomen. There is mild peribronchial thickening with increased densities at the lung bases. Slightly low lung volumes. Heart size is stable. IMPRESSION: Low lung volumes with mild peribronchial  thickening. Cannot exclude mild interstitial edema. Support apparatuses as described. Electronically Signed   By: Richarda Overlie M.D.   On: 01/28/2015 08:04   Dg Chest Port 1 View  01/27/2015  CLINICAL DATA:  New tracheostomy EXAM: PORTABLE CHEST 1 VIEW COMPARISON:  Chest radiograph from one day prior. FINDINGS: Tracheostomy tube tip overlies the tracheal air column just below the thoracic inlet. Enteric tube enters the stomach with the tip not seen on this image. Right internal jugular central venous catheter terminates at the cavoatrial junction. Stable cardiomediastinal silhouette with normal heart size. No pneumothorax. No pleural effusion. Stable mild left basilar atelectasis. No pulmonary edema. No new lung opacity. IMPRESSION: 1. Well-positioned tracheostomy tube. 2. Mild left basilar atelectasis. Otherwise no active cardiopulmonary disease. Electronically Signed   By: Delbert Phenix M.D.   On: 01/27/2015 11:33   Dg Chest Port 1 View  01/26/2015  CLINICAL DATA:  Respiratory failure. EXAM: PORTABLE CHEST 1 VIEW COMPARISON:  01/24/2015 FINDINGS: Endotracheal tube is 1.9 cm above the carina. Central line tip in the upper right atrium. Small nodular density in the right upper chest probably related to the ECG lead. Streaky densities in the left lower lung are suggestive for atelectasis. Heart size is normal. Negative for a pneumothorax. Nasogastric tube extends into the abdomen. IMPRESSION: Few densities at the left lung base are suggestive for atelectasis. Otherwise, no focal airspace disease. Support apparatuses as described. Subtle nodular density in the right upper chest is probably related to the overlying support apparatus as described. Recommend attention to this area on follow up imaging. Electronically Signed   By: Richarda Overlie M.D.   On: 01/26/2015 07:42   Dg Chest Port 1 View  01/24/2015  CLINICAL DATA:  Endotracheal tube placement. EXAM: PORTABLE CHEST 1 VIEW COMPARISON:  01/23/2015 and  01/22/2015. FINDINGS: 0555 hours. The endotracheal tube has been advanced and is now 1 cm above the carina. Right IJ central venous catheter projects to the mid right atrial level. Nasogastric tube projects below the diaphragm, tip not visualized. The heart size and mediastinal contours are stable. There is stable mild atelectasis at both lung bases. No pneumothorax or significant pleural effusion identified. Multiple lines overlie the chest. IMPRESSION: The endotracheal tube is low with its tip within 1 cm of carina. For more optimal positioning, this could be withdrawn 3-4 cm. No other significant changes. Electronically Signed   By: Carey Bullocks M.D.   On: 01/24/2015 08:07   Dg Chest Port 1 View  01/23/2015  CLINICAL DATA:  Evaluate ET tube placement EXAM: PORTABLE CHEST 1 VIEW COMPARISON:  01/22/2015 FINDINGS: The endotracheal tube tip is above the carina. There is a right IJ catheter with tip in the right atrium. Nasogastric tube tip is below the field of view. Normal heart size. Mild pulmonary edema is  identified, increased from previous exam. IMPRESSION: 1. Stable support apparatus. 2. Mild pulmonary edema. Electronically Signed   By: Signa Kell M.D.   On: 01/23/2015 09:55   Dg Chest Port 1 View  01/22/2015  CLINICAL DATA:  Central line placement.  Initial encounter. EXAM: PORTABLE CHEST 1 VIEW COMPARISON:  Chest radiograph performed 01/21/2015 FINDINGS: The patient's new right IJ line is noted extending into the right atrium. This could be retracted approximately 5 cm, as deemed clinically appropriate. The endotracheal tube is seen ending 3-4 cm above the carina. A right PICC is noted ending about the proximal SVC. An enteric tube is noted extending below the diaphragm. The lungs are hypoexpanded. Mild left basilar atelectasis is noted. No pleural effusion or pneumothorax is seen. The cardiomediastinal silhouette is mildly enlarged. No acute osseous abnormalities are identified. IMPRESSION: 1.  Right IJ line noted extending into the right atrium. This could be retracted approximately 5 cm to the cavoatrial junction, as deemed clinically appropriate. 2. Lungs hypoexpanded, with mild left basilar atelectasis. 3. Mild cardiomegaly. These results were called by telephone at the time of interpretation on 01/22/2015 at 2:19 am to [facility], who verbally acknowledged these results. Electronically Signed   By: Roanna Raider M.D.   On: 01/22/2015 02:19   Dg Chest Port 1 View  01/21/2015  CLINICAL DATA:  Evaluate ET tube placement EXAM: PORTABLE CHEST 1 VIEW COMPARISON:  01/20/2015 FINDINGS: Right arm PICC line tip is in the cavoatrial junction. ET tube tip is above the carina. There is a nasogastric tube with tip in the stomach. Moderate cardiac enlargement. There is no pleural effusion or edema. No airspace consolidation. IMPRESSION: 1. ET tube tip in satisfactory position above the carina. 2. Stable cardiac enlargement. Electronically Signed   By: Signa Kell M.D.   On: 01/21/2015 07:57   Dg Chest Port 1 View  01/20/2015  CLINICAL DATA:  Intubated. EXAM: PORTABLE CHEST 1 VIEW COMPARISON:  01/19/2015 FINDINGS: The endotracheal tube lies 1 cm above the carina. Right PICC terminates over the cavoatrial junction. Cardiac silhouette remains mildly enlarged. Enteric tube courses into the left upper abdomen with tip not imaged. There is improved aeration of the right lung base. Retrocardiac opacity in the left lung base is similar to the prior study. No sizable pleural effusion or pneumothorax is identified. IMPRESSION: 1. Endotracheal tube 1 cm above the carina. Suggest retracting slightly. 2. Improved aeration of the right lung base. Unchanged left basilar atelectasis. Electronically Signed   By: Sebastian Ache M.D.   On: 01/20/2015 07:51   Dg Abd Portable 1v  02/09/2015  CLINICAL DATA:  New onset nausea. EXAM: PORTABLE ABDOMEN - 1 VIEW COMPARISON:  01/27/2015 FINDINGS: There is a gastrostomy  tube present. There is no bowel dilatation to suggest obstruction. There is no evidence of pneumoperitoneum, portal venous gas or pneumatosis. There are no pathologic calcifications along the expected course of the ureters. The osseous structures are unremarkable. IMPRESSION: Negative. Electronically Signed   By: Elige Ko   On: 02/09/2015 08:17   Dg Abd Portable 1v  01/27/2015  CLINICAL DATA:  Patient status post feeding tube placement. EXAM: PORTABLE ABDOMEN - 1 VIEW COMPARISON:  Abdominal radiograph 01/13/2015. FINDINGS: Feeding tube is present with tip projecting at the gastric antrum. Gas is demonstrated throughout the colon. No definite evidence for free intraperitoneal air. Cardiomegaly. Focal consolidative opacity within the left lung base. IMPRESSION: Feeding tube tip projects at the gastric antrum. Focal nodular consolidative opacity within the  left lung base potentially secondary to an infectious process or atelectasis. Continued radiographic follow-up is recommended to ensure resolution. Electronically Signed   By: Annia Belt M.D.   On: 01/27/2015 11:39   US Abdomen Limited Ruq  02/11/2015  CLINICAL DATA:  Elevated liver enzymes. EXAM: US ABDOMEN LIMITED - RIGHT UPPER QUADRANT COMPARISON:  None. FINDINGS: Gallbladder: No gallstones or wall thickening visualized. No sonographic Murphy sign noted by sonographer. Common bile duct: Diameter: 2.7 mm Liver: No focal lesion identified. Within normal limits in parenchymal echogenicity. IMPRESSION: Negative exam. Electronically Signed   By: Maisie Fus  Register   On: 02/11/2015 07:42     CBC  Recent Labs Lab 02/17/15 1518 02/18/15 0222  WBC 15.9* 13.5*  HGB 11.9* 9.0*  HCT 37.4 28.6*  PLT 250 218  MCV 97.1 97.3  MCH 30.9 30.6  MCHC 31.8 31.5  RDW 16.4* 16.5*    Chemistries   Recent Labs Lab 02/17/15 1518 02/18/15 0222  NA 142 144  K 4.4 3.7  CL 103 109  CO2 25 25  GLUCOSE 201* 126*  BUN 22* 19  CREATININE 0.84 0.67  CALCIUM  9.8 8.7*  AST 27  --   ALT 57*  --   ALKPHOS 144*  --   BILITOT 0.1*  --    ------------------------------------------------------------------------------------------------------------------ estimated creatinine clearance is 81.2 mL/min (by C-G formula based on Cr of 0.67). ------------------------------------------------------------------------------------------------------------------ No results for input(s): HGBA1C in the last 72 hours. ------------------------------------------------------------------------------------------------------------------ No results for input(s): CHOL, HDL, LDLCALC, TRIG, CHOLHDL, LDLDIRECT in the last 72 hours. ------------------------------------------------------------------------------------------------------------------ No results for input(s): TSH, T4TOTAL, T3FREE, THYROIDAB in the last 72 hours.  Invalid input(s): FREET3 ------------------------------------------------------------------------------------------------------------------ No results for input(s): VITAMINB12, FOLATE, FERRITIN, TIBC, IRON, RETICCTPCT in the last 72 hours.  Coagulation profile  Recent Labs Lab 02/17/15 1636  INR 1.21    No results for input(s): DDIMER in the last 72 hours.  Cardiac Enzymes No results for input(s): CKMB, TROPONINI, MYOGLOBIN in the last 168 hours.  Invalid input(s): CK ------------------------------------------------------------------------------------------------------------------ Invalid input(s): POCBNP     Time Spent in minutes   35 minutes   Odies Desa M.D on 02/18/2015 at 10:44 AM  Between 7am to 7pm - Pager - [phone]  After 7pm go to [email or web address] - password [facility]  [facility]   Office  [phone]

## 2015-02-18 NOTE — Progress Notes (Signed)
Pts history reviewed, spoke with primary service. Pt is known to me from recent admission for high-grade SAH. 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 She did not require permanent CSF diversion. She remained relatively obtunded throughout her hospitalization. Most recent CT on 1/6 was unremarkable. She has been re-admitted from her SNF for hypoxia and found to have large bilateral PE. I believe her risk of further intracranial hemorrhage is low given the aneurysm is occluded and I would certainly proceed with anticoagulation if needed as this is probably a much greater threat to her than the risk of intracranial hemorrhage.

## 2015-02-18 NOTE — Assessment & Plan Note (Signed)
Status post ACA aneurysm coiling and IVC drain placement (now out) . She remains minimally responsive and moves only to painful stimuli, she has trach and PEG. She is currently on pneumonia pain which will be stopped since 21 days are over, Decadron 1 mg IV twice a day which will be stopped, continue Keppra along with statin. I discussed this with neurosurgeon on call Dr. [email or web address] on 02/08/2015. Continue supportive care will require placement.  Unfortunately she remains at very high risk for developing aspiration pneumonia, decubitus ulcer, urinary retention with UTI, unfortunately her mental status is not likely to improve much and neither is her quality of life. If declines further I have strongly encouraged family to focus on comfort care SNF - supportive care 

## 2015-02-18 NOTE — Assessment & Plan Note (Signed)
2/2 SAH, pt intubated and able to be extubated with now chronic respiratory failure with hypoxia SNF - cont trach, o2 and trach care

## 2015-02-18 NOTE — Progress Notes (Signed)
Utilization Review Completed.Rachel Vang T1/20/2017  

## 2015-02-18 NOTE — Progress Notes (Signed)
02/18/2015 Patient foley cath 16 french that she came into the hospital with was removed at 1220. The tech had empty 250cc from the bag. When foley was removed she had 50cc of clear yellow urine. New foley cath 14 french placed on 02/18/2015 and she had a small amount of clear, yellow urine. [facility] RN.

## 2015-02-18 NOTE — Progress Notes (Signed)
02/18/2015 Vascular tech called the ultrasound that was performed on patient lower extremity. Rn notified Dr Randol Kern at 1120. The preliminary finding said DVT noted in the left common femoral, proximal femoral profunda femoral. Lovie Macadamia RN

## 2015-02-18 NOTE — Progress Notes (Signed)
D/W Dr Conchita Paris regarding new extensive PE findings and anticoagulation recommendation in the setting of recent ICH, at this point he agrees with anticoagulation given her event was more than month ago, and her  extensive and life threathing PE. Huey Bienenstock MD

## 2015-02-18 NOTE — Assessment & Plan Note (Signed)
Abx tx concluded prior to d/c

## 2015-02-18 NOTE — Assessment & Plan Note (Signed)
2/2 h Flu - Abx treatment finished

## 2015-02-18 NOTE — Assessment & Plan Note (Signed)
As placed on oral vancomycin via PEG tube, diarrhea has completely resolved, now constipated and no BM since 02/11/2015. Stop vancomycin. Place on Colace SNF - monitor for reoccurence C Diff

## 2015-02-18 NOTE — Assessment & Plan Note (Signed)
Undiagnosed DM2 ; SNF -  SSI + Lantus, continue to monitor CBGs every 4 hours.

## 2015-02-18 NOTE — Progress Notes (Signed)
*  PRELIMINARY RESULTS* Vascular Ultrasound Lower extremity venous duplex has been completed.  Preliminary findings: DVT noted in the left common femoral, proximal femoral and proximal profunda femoral.  No DVT RLE.   Farrel Demark, RDMS, RVT  02/18/2015, 10:51 AM

## 2015-02-18 NOTE — Assessment & Plan Note (Signed)
Tracheobronchitis - abx tx concluded

## 2015-02-18 NOTE — Assessment & Plan Note (Signed)
SNF - well controlled on Lopressor, lisinopril, clonidine and Norvasc

## 2015-02-18 NOTE — Assessment & Plan Note (Addendum)
Presumed sudden, suspect PE. Aspiration PNA always possibility, pt appears febrile. Of course, immediate send to hospital.

## 2015-02-19 ENCOUNTER — Inpatient Hospital Stay (HOSPITAL_COMMUNITY): Payer: Medicaid Other

## 2015-02-19 DIAGNOSIS — I2699 Other pulmonary embolism without acute cor pulmonale: Secondary | ICD-10-CM

## 2015-02-19 DIAGNOSIS — O2230 Deep phlebothrombosis in pregnancy, unspecified trimester: Secondary | ICD-10-CM

## 2015-02-19 DIAGNOSIS — J9621 Acute and chronic respiratory failure with hypoxia: Secondary | ICD-10-CM

## 2015-02-19 DIAGNOSIS — D62 Acute posthemorrhagic anemia: Secondary | ICD-10-CM

## 2015-02-19 DIAGNOSIS — K922 Gastrointestinal hemorrhage, unspecified: Secondary | ICD-10-CM

## 2015-02-19 HISTORY — DX: Other pulmonary embolism without acute cor pulmonale: I26.99

## 2015-02-19 LAB — CBC WITH DIFFERENTIAL/PLATELET
Basophils Absolute: 0 10*3/uL (ref 0.0–0.1)
Basophils Relative: 0 %
EOS ABS: 0.1 10*3/uL (ref 0.0–0.7)
EOS PCT: 1 %
HCT: 25.3 % — ABNORMAL LOW (ref 36.0–46.0)
Hemoglobin: 8 g/dL — ABNORMAL LOW (ref 12.0–15.0)
LYMPHS ABS: 2.2 10*3/uL (ref 0.7–4.0)
LYMPHS PCT: 18 %
MCH: 30.3 pg (ref 26.0–34.0)
MCHC: 31.6 g/dL (ref 30.0–36.0)
MCV: 95.8 fL (ref 78.0–100.0)
MONO ABS: 1 10*3/uL (ref 0.1–1.0)
MONOS PCT: 8 %
Neutro Abs: 8.9 10*3/uL — ABNORMAL HIGH (ref 1.7–7.7)
Neutrophils Relative %: 73 %
PLATELETS: 203 10*3/uL (ref 150–400)
RBC: 2.64 MIL/uL — ABNORMAL LOW (ref 3.87–5.11)
RDW: 16 % — ABNORMAL HIGH (ref 11.5–15.5)
WBC: 12.2 10*3/uL — AB (ref 4.0–10.5)

## 2015-02-19 LAB — CBC
HCT: 25.2 % — ABNORMAL LOW (ref 36.0–46.0)
Hemoglobin: 8 g/dL — ABNORMAL LOW (ref 12.0–15.0)
MCH: 30.4 pg (ref 26.0–34.0)
MCHC: 31.7 g/dL (ref 30.0–36.0)
MCV: 95.8 fL (ref 78.0–100.0)
PLATELETS: 199 10*3/uL (ref 150–400)
RBC: 2.63 MIL/uL — AB (ref 3.87–5.11)
RDW: 16 % — ABNORMAL HIGH (ref 11.5–15.5)
WBC: 11.9 10*3/uL — AB (ref 4.0–10.5)

## 2015-02-19 LAB — BASIC METABOLIC PANEL
Anion gap: 11 (ref 5–15)
BUN: 13 mg/dL (ref 6–20)
CHLORIDE: 108 mmol/L (ref 101–111)
CO2: 22 mmol/L (ref 22–32)
CREATININE: 0.47 mg/dL (ref 0.44–1.00)
Calcium: 8.6 mg/dL — ABNORMAL LOW (ref 8.9–10.3)
GFR calc Af Amer: >60 mL/min (ref >60)
GFR calc non Af Amer: >60 mL/min (ref >60)
GLUCOSE: 205 mg/dL — AB (ref 65–99)
POTASSIUM: 3.6 mmol/L (ref 3.5–5.1)
SODIUM: 141 mmol/L (ref 135–145)

## 2015-02-19 LAB — HEMOGLOBIN: Hemoglobin: 7.5 g/dL — ABNORMAL LOW (ref 12.0–15.0)

## 2015-02-19 LAB — HEPARIN LEVEL (UNFRACTIONATED): HEPARIN UNFRACTIONATED: 0.16 [IU]/mL — AB (ref 0.30–0.70)

## 2015-02-19 LAB — GLUCOSE, CAPILLARY
GLUCOSE-CAPILLARY: 158 mg/dL — AB (ref 65–99)
GLUCOSE-CAPILLARY: 156 mg/dL — AB (ref 65–99)
GLUCOSE-CAPILLARY: 126 mg/dL — AB (ref 65–99)
Glucose-Capillary: 125 mg/dL — ABNORMAL HIGH (ref 65–99)
Glucose-Capillary: 136 mg/dL — ABNORMAL HIGH (ref 65–99)
Glucose-Capillary: 184 mg/dL — ABNORMAL HIGH (ref 65–99)

## 2015-02-19 LAB — PREPARE RBC (CROSSMATCH)

## 2015-02-19 MED ORDER — PEG 3350-KCL-NA BICARB-NACL 420 G PO SOLR
2000.0000 mL | Freq: Once | ORAL | Status: AC
  Administered 2015-02-19: 2000 mL via ORAL
  Filled 2015-02-19: qty 4000

## 2015-02-19 MED ORDER — MEGESTROL ACETATE 40 MG PO TABS
80.0000 mg | ORAL_TABLET | Freq: Three times a day (TID) | ORAL | Status: DC
  Administered 2015-02-19: 80 mg via ORAL
  Filled 2015-02-19 (×4): qty 2

## 2015-02-19 MED ORDER — KCL IN DEXTROSE-NACL 20-5-0.9 MEQ/L-%-% IV SOLN
INTRAVENOUS | Status: DC
  Administered 2015-02-19 – 2015-02-20 (×3): via INTRAVENOUS
  Filled 2015-02-19 (×4): qty 1000

## 2015-02-19 MED ORDER — METOCLOPRAMIDE HCL 5 MG/ML IJ SOLN
5.0000 mg | Freq: Once | INTRAMUSCULAR | Status: AC
  Administered 2015-02-20: 5 mg via INTRAVENOUS
  Filled 2015-02-19: qty 2

## 2015-02-19 MED ORDER — METOCLOPRAMIDE HCL 5 MG/ML IJ SOLN
5.0000 mg | Freq: Once | INTRAMUSCULAR | Status: AC
  Administered 2015-02-19: 5 mg via INTRAVENOUS
  Filled 2015-02-19: qty 2

## 2015-02-19 MED ORDER — PEG 3350-KCL-NA BICARB-NACL 420 G PO SOLR
2000.0000 mL | Freq: Once | ORAL | Status: AC
  Administered 2015-02-20: 2000 mL via ORAL

## 2015-02-19 MED ORDER — SODIUM CHLORIDE 0.9 % IV SOLN
Freq: Once | INTRAVENOUS | Status: AC
  Administered 2015-02-19: 17:00:00 via INTRAVENOUS

## 2015-02-19 NOTE — Plan of Care (Signed)
Problem: Fluid Volume: Goal: Hemodynamic stability will improve Outcome: Progressing Patient currently receiving IVF.

## 2015-02-19 NOTE — Consult Note (Signed)
Consultation  Referring Provider:     [facility] Primary Care Physician:  No primary care provider on file. Primary Gastroenterologist:        none Reason for Consultation:     GI bleed     Impression / Plan:   Lower GI bleed in setting of heparin started for bilateral pulmonary emboli Acute blood loss anemia on top of chronic anemia Recent subarachnoid hemorrhage/cerebral aneurysm rupture C diff PTA - negative now S/p PEG and tracheostomy Unable to consent   Colonoscopy tomorrow - off heparin The risks and benefits as well as alternatives of endoscopic procedure(s) have been discussed and reviewed. All questions answered. The patient's son agrees to proceed. I have discussed w/ both sons today. She is at higher risk of problems given co-morbidities.  Recheck Hgb 1200 - could need transfusion Type and screen ordered Stop enteric precautions - C dif screen negative  She did have some diarrhea prior to the bleeding and given her scenario could have ischemic colitis - BP's here not too low but might have been at SNF          HPI:   Rachel Vang is a 50 y.o. female with recent intracranial hemorrhage as above, readmitted 1/19 post dc 1/13 with bilat pulm emboli. Started on heparin and after a few loose stools started to have copious rectal bleeding reported. Hgb 9 to 8.  She is unable to communicate. Heparin has been stopped. No melena.     Past Medical History  Diagnosis Date  . Hypertension   . Acute respiratory failure (HCC)   . Epilepsy (HCC)   . GERD (gastroesophageal reflux disease)   . Nontraumatic subarachnoid hemorrhage (HCC)   . Urinary retention   . Dysphagia   . Hyperlipidemia   . IBS (irritable bowel syndrome)   . Diabetes mellitus without complication (HCC)     Type 2, W/o complications    Past Surgical History  Procedure Laterality Date  . Radiology with anesthesia N/A 01/13/2015    Procedure: RADIOLOGY WITH ANESTHESIA;  Surgeon: Lisbeth Renshaw, MD;  Location: [facility] OR;  Service: Radiology;  Laterality: N/A;  . Esophagogastroduodenoscopy (egd) with propofol N/A 02/02/2015    Procedure: ESOPHAGOGASTRODUODENOSCOPY (EGD) WITH PROPOFOL;  Surgeon: Jimmye Norman, MD;  Location: [facility] ENDOSCOPY;  Service: General;  Laterality: N/A;  . Peg placement N/A 02/02/2015    Procedure: PERCUTANEOUS ENDOSCOPIC GASTROSTOMY (PEG) PLACEMENT;  Surgeon: Jimmye Norman, MD;  Location: [facility] ENDOSCOPY;  Service: General;  Laterality: N/A;  . Abdominal surgery    . Aneurysm coiling    . Tracheostomy      Not contributory  Social History  Substance Use Topics  . Smoking status: Former Games developer  . Smokeless tobacco: Never Used  . Alcohol Use: No    Prior to Admission medications   Medication Sig Start Date End Date Taking? Authorizing Provider  Amino Acids-Protein Hydrolys (FEEDING SUPPLEMENT, PRO-STAT SUGAR FREE 64,) LIQD Place 30 mLs into feeding tube 2 (two) times daily. 02/11/15  Yes Leroy Sea, MD  bethanechol (URECHOLINE) 10 MG tablet Take 1 tablet (10 mg total) by mouth every 8 (eight) hours. 02/11/15  Yes Leroy Sea, MD  cloNIDine (CATAPRES) 0.1 MG tablet Take 1 tablet (0.1 mg total) by mouth 3 (three) times daily. 02/11/15  Yes Leroy Sea, MD  fentaNYL (DURAGESIC - DOSED MCG/HR) 25 MCG/HR patch Place 1 patch (25 mcg total) onto the skin every 3 (three) days. 02/11/15  Yes Prashant Curlene Labrum,  MD  insulin aspart (NOVOLOG) 100 UNIT/ML injection Inject 0-20 Units into the skin every 4 (four) hours. 02/11/15  Yes Leroy Sea, MD  insulin glargine (LANTUS) 100 UNIT/ML injection Inject 0.1 mLs (10 Units total) into the skin 2 (two) times daily. 02/11/15  Yes Leroy Sea, MD  levETIRAcetam (KEPPRA) 100 MG/ML solution Place 5 mLs (500 mg total) into feeding tube 2 (two) times daily. 02/11/15  Yes Leroy Sea, MD  metoprolol tartrate (LOPRESSOR) 25 MG tablet Take 5 tablets (125 mg total) by mouth 2 (two) times daily. 02/11/15  Yes Leroy Sea, MD  Nutritional Supplements (FEEDING SUPPLEMENT, JEVITY 1.2 CAL,) LIQD Place 1,000 mLs into feeding tube continuous. 02/11/15  Yes Leroy Sea, MD  amLODipine (NORVASC) 10 MG tablet Take 1 tablet (10 mg total) by mouth daily. Patient not taking: Reported on 02/17/2015 02/11/15   Leroy Sea, MD  antiseptic oral rinse (CPC / CETYLPYRIDINIUM CHLORIDE 0.05%) 0.05 % LIQD solution 7 mLs by Mouth Rinse route 2 times daily at 12 noon and 4 pm. Patient not taking: Reported on 02/17/2015 02/11/15   Leroy Sea, MD  docusate sodium (COLACE) 100 MG capsule Take 2 capsules (200 mg total) by mouth daily. Patient not taking: Reported on 02/17/2015 02/13/15   Leroy Sea, MD  famotidine (PEPCID) 40 MG/5ML suspension Place 2.5 mLs (20 mg total) into feeding tube 2 (two) times daily. Patient not taking: Reported on 02/17/2015 02/11/15   Leroy Sea, MD  lisinopril (PRINIVIL,ZESTRIL) 10 MG tablet Take 1 tablet (10 mg total) by mouth daily. Patient not taking: Reported on 02/17/2015 02/11/15   Leroy Sea, MD  simvastatin (ZOCOR) 20 MG tablet Place 1 tablet (20 mg total) into feeding tube daily at 6 PM. Patient not taking: Reported on 02/17/2015 02/11/15   Leroy Sea, MD  tamsulosin (FLOMAX) 0.4 MG CAPS capsule Take 1 capsule (0.4 mg total) by mouth daily. Patient not taking: Reported on 02/17/2015 02/12/15   Leroy Sea, MD    Current Facility-Administered Medications  Medication Dose Route Frequency Provider Last Rate Last Dose  . albuterol (PROVENTIL) (2.5 MG/3ML) 0.083% nebulizer solution 2.5 mg  2.5 mg Nebulization Q2H PRN Starleen Arms, MD      . bethanechol (URECHOLINE) tablet 10 mg  10 mg Oral 3 times per day Starleen Arms, MD   10 mg at 02/19/15 0503  . dextrose 5 % and 0.9 % NaCl with KCl 20 mEq/L infusion   Intravenous Continuous Iva Boop, MD      . feeding supplement (JEVITY 1.2 CAL) liquid 1,000 mL  1,000 mL Per Tube Continuous Starleen Arms, MD  50 mL/hr at 02/18/15 2348 1,000 mL at 02/18/15 2348  . feeding supplement (PRO-STAT SUGAR FREE 64) liquid 30 mL  30 mL Per Tube BID Starleen Arms, MD   30 mL at 02/19/15 1057  . fentaNYL (DURAGESIC - dosed mcg/hr) patch 25 mcg  25 mcg Transdermal Q72H Starleen Arms, MD   25 mcg at 02/17/15 2223  . insulin aspart (novoLOG) injection 0-9 Units  0-9 Units Subcutaneous 6 times per day Starleen Arms, MD   1 Units at 02/19/15 0829  . levETIRAcetam (KEPPRA) 100 MG/ML solution 500 mg  500 mg Per Tube BID Starleen Arms, MD   500 mg at 02/19/15 1058  . megestrol (MEGACE) tablet 80 mg  80 mg Oral TID PC Ugonna A Anyanwu, MD   80 mg at  02/19/15 1059  . metoCLOPramide (REGLAN) injection 5 mg  5 mg Intravenous Once Iva Boop, MD      . Melene Muller ON 02/20/2015] metoCLOPramide (REGLAN) injection 5 mg  5 mg Intravenous Once Iva Boop, MD      . metoprolol tartrate (LOPRESSOR) tablet 125 mg  125 mg Oral BID Starleen Arms, MD   125 mg at 02/19/15 1058  . piperacillin-tazobactam (ZOSYN) IVPB 3.375 g  3.375 g Intravenous 3 times per day Baldemar Friday, RPH   3.375 g at 02/19/15 0502  . polyethylene glycol-electrolytes (NuLYTELY/GoLYTELY) solution 2,000 mL  2,000 mL Oral Once Iva Boop, MD      . Melene Muller ON 02/20/2015] polyethylene glycol-electrolytes (NuLYTELY/GoLYTELY) solution 2,000 mL  2,000 mL Oral Once Iva Boop, MD      . sodium chloride 0.9 % injection 3 mL  3 mL Intravenous Q12H Starleen Arms, MD   3 mL at 02/18/15 1022  . vancomycin (VANCOCIN) IVPB 1000 mg/200 mL premix  1,000 mg Intravenous Q12H Darl Householder Masters, RPH   1,000 mg at 02/19/15 0359    Allergies as of 02/17/2015  . (No Known Allergies)     Review of Systems:    This is positive for those things mentioned in the HPI, also positive for tracheostomy, PEG. All other review of systems are negative/unobtainable      Physical Exam:  Vital signs in last 24 hours: Temp:  [97.3 F (36.3 C)-99.2 F  (37.3 C)] 98.7 F (37.1 C) (01/21 0700) Pulse Rate:  [50-133] 105 (01/21 1000) Resp:  [19-42] 30 (01/21 1000) BP: (100-152)/(70-103) 122/76 mmHg (01/21 1000) SpO2:  [94 %-100 %] 100 % (01/21 1000) FiO2 (%):  [28 %] 28 % (01/21 0803) Last BM Date: 02/18/15  General:  Chronically and acutely ill - does not speak Eyes:  anicteric. ENT:   Mouth and posterior pharynx free of lesions.  Neck:   trachostomy  Lungs: Clear to auscultation bilaterally.anterior Heart:  S1S2, no rubs, murmurs, gallops. Abdomen:  soft, non-tender, no hepatosplenomegaly, hernia, or mass and BS+. Gastrostomy in LUQ Rectal: Reddish liquid Extremities:   no edema Skin   no rash. Neuro:  Non-communicative, left gaze predominance .   Data Reviewed:   LAB RESULTS:  Recent Labs  02/18/15 0222 02/19/15 0130 02/19/15 0453  WBC 13.5* 12.2* 11.9*  HGB 9.0* 8.0* 8.0*  HCT 28.6* 25.3* 25.2*  PLT 218 203 199   BMET  Recent Labs  02/17/15 1518 02/18/15 0222 02/19/15 0453  NA 142 144 141  K 4.4 3.7 3.6  CL 103 109 108  CO2 GLUCOSE 201* 126* 205*  BUN 22* 19 13  CREATININE 0.84 0.67 0.47  CALCIUM 9.8 8.7* 8.6*   LFT  Recent Labs  02/17/15 1518  PROT 7.4  ALBUMIN 2.9*  AST 27  ALT 57*  ALKPHOS 144*  BILITOT 0.1*   PT/INR  Recent Labs  02/17/15 1636  LABPROT 15.5*  INR 1.21    STUDIES: Ct Angio Chest Pe W/cm &/or Wo Cm  02/17/2015  CLINICAL DATA:  50 year old female with shallow respirations and shortness of breath. Low oxygen saturations. EXAM: CT ANGIOGRAPHY CHEST WITH CONTRAST TECHNIQUE: Multidetector CT imaging of the chest was performed using the standard protocol during bolus administration of intravenous contrast. Multiplanar CT image reconstructions and MIPs were obtained to evaluate the vascular anatomy. CONTRAST:  80mL OMNIPAQUE IOHEXOL 350 MG/ML SOLN COMPARISON:  No priors. FINDINGS: Mediastinum/Lymph Nodes: There are filling  defects in the distal main pulmonary  arteries bilaterally extending into lobar, segmental and subsegmental sized branches throughout most of the lungs. No saddle embolus is identified. Pulmonic trunk measures 2.8 cm in diameter. Right ventricle measures up to 3.2 cm in diameter. Left ventricle measures up to 4.1 cm in diameter. Mild cardiomegaly. Left ventricular concentric hypertrophy. There is no significant pericardial fluid, thickening or pericardial calcification. There is atherosclerosis of the thoracic aorta, the great vessels of the mediastinum and the coronary arteries, including calcified atherosclerotic plaque in the left anterior descending, left circumflex and right coronary arteries. No pathologically enlarged mediastinal or hilar lymph nodes. Esophagus is unremarkable in appearance. No axillary lymphadenopathy. Tracheostomy tube in position with tip terminating approximately 4.6 cm above the carina. Lungs/Pleura: Areas of dependent atelectasis are noted in lower lobes of the lungs bilaterally. No acute consolidative airspace disease. No pleural effusions. No definite suspicious appearing pulmonary nodules or masses. Upper Abdomen: Unremarkable. Musculoskeletal/Soft Tissues: There are no aggressive appearing lytic or blastic lesions noted in the visualized portions of the skeleton. Review of the MIP images confirms the above findings. IMPRESSION: 1. Extensive pulmonary emboli involving distal main pulmonary arteries extending into lobar, segmental and subsegmental sized branches throughout the lungs bilaterally. At this time, there are no overt findings to suggest right heart strain. 2. Cardiomegaly with left ventricular concentric hypertrophy. 3. Dependent atelectasis throughout the lower lobes of the lungs bilaterally. Critical Value/emergent results were called by telephone at the time of interpretation on 02/17/2015 at 7:40 pm to Dr. Silas Flood, who verbally acknowledged these results. Electronically Signed   By: Trudie Reed M.D.    On: 02/17/2015 19:42   Dg Chest Portable 1 View  02/17/2015  CLINICAL DATA:  Shortness of breath.  Trauma.  Unresponsive patient. EXAM: PORTABLE CHEST 1 VIEW COMPARISON:  02/06/2015 FINDINGS: Tracheostomy tube projects along the tracheal air column. Low lung volumes are present, causing crowding of the pulmonary vasculature. Mild enlargement of the cardiopericardial silhouette, without edema. No discrete airspace opacity identified. No pneumothorax. IMPRESSION: 1. Low lung volumes but the lungs appear clear. Tracheostomy tube noted. 2. Mild enlargement of the cardiopericardial silhouette. Electronically Signed   By: Gaylyn Rong M.D.   On: 02/17/2015 15:40     PREVIOUS ENDOSCOPIES:            PEG EGD - negative 02/02/2015 dr. Lindie Spruce   Thanks   LOS: 2 days    Sena Slate, MD, [facility] @  02/19/2015, 11:06 AM

## 2015-02-19 NOTE — Progress Notes (Signed)
ANTICOAGULATION CONSULT NOTE - Initial Consult  Pharmacy Consult for heparin Indication: pulmonary embolus  No Known Allergies  Patient Measurements: Height:  (165.1 cm) Weight: 152 lb 5.4 oz (69.1 kg) IBW/kg (Calculated) : 57  Vital Signs: Temp: 97.8 F (36.6 C) (01/21 1200) Temp Source: Axillary (01/21 1200) BP: 114/80 mmHg (01/21 1200) Pulse Rate: 91 (01/21 1200)  Labs:  Recent Labs  02/17/15 1518 02/17/15 1636 02/18/15 0222 02/18/15 1620 02/19/15 0130 02/19/15 0453  HGB 11.9*  --  9.0*  --  8.0* 8.0*  HCT 37.4  --  28.6*  --  25.3* 25.2*  PLT 250  --  218  --  203 199  LABPROT  --  15.5*  --   --   --   --   INR  --  1.21  --   --   --   --   HEPARINUNFRC  --   --   --  0.46 0.16*  --   CREATININE 0.84  --  0.67  --   --  0.47    Estimated Creatinine Clearance: 81.2 mL/min (by C-G formula based on Cr of 0.47).   Medical History: Past Medical History  Diagnosis Date  . Hypertension   . Acute respiratory failure (HCC)   . Epilepsy (HCC)   . GERD (gastroesophageal reflux disease)   . Nontraumatic subarachnoid hemorrhage (HCC)   . Urinary retention   . Dysphagia   . Hyperlipidemia   . IBS (irritable bowel syndrome)   . Diabetes mellitus without complication (HCC)     Type 2, W/o complications    Assessment: 50 yo F presents on 1/19 secondary to hypoxia. CT of chest shows extensive PE. Heparin started but now with significant hematochezia. Hgb low at 8.0, plts wnl. Currently holding heparin. GI consulted and plan a colonoscopy on 1/22  Goal of Therapy:  Heparin level 0.3-0.7 units/ml Monitor platelets by anticoagulation protocol: Yes   Plan:  Hold heparin gtt for now F/U restart for PE when able Monitor CBC, s/s of bleed  Enzo Bi, PharmD, BCPS Clinical Pharmacist Pager [phone] 02/19/2015 1:04 PM

## 2015-02-19 NOTE — Progress Notes (Signed)
Gram positive cocci and clusters in Aerobic Blood Culture called into on call MD.

## 2015-02-19 NOTE — Progress Notes (Signed)
Patient had large blood clots coming from vaginal region.  MD called and STAT labs drawn. (See MAR)

## 2015-02-19 NOTE — Progress Notes (Addendum)
Patient Demographics  Rachel Vang, is a 50 y.o. female, DOB - [birth year], ZDG:[id number]  Admit date - 02/17/2015   Admitting Physician Starleen Arms, MD  Outpatient Primary MD for the patient is No primary care provider on file.  LOS - 2   Chief Complaint  Patient presents with  . Shortness of Breath       Admission HPI/Brief narrative: 50 y.o. female, with past medical history of hypertension, recent lengthy hospitalization, secondary to intracranial hemorrhage, status post tracheostomy, PEG tube and chronic Foley, discharged on 02/14/2015 to SNF, presents with respiratory distress, CTA chest significant for extensive bilateral PE, as well sepsis secondary to UTI, cleared by neurosurgery to start on anticoagulation, she developed lower GI bleed(hematochezia) on heparin drip which is currently on hold.  Subjective:   Rachel Vang today on communicative, can't provide any complaints . Assessment & Plan    Principal Problem:   Sepsis (HCC) Active Problems:   ICH (intracerebral hemorrhage) (HCC)   Acute and chronic respiratory failure with hypoxia (HCC)   Diabetes mellitus (HCC)  Sepsis  - Presents with fever, tachypnea, tachycardia and leukocytosis  - Most likely related to UTI ,  - urine culture growing gram-negative rods - follow on Blood cultures, tracheal aspirate . - On broad-spectrum antibiotics vancomycin and Zosyn, stop IV vancomycin, continue with IV Zosyn pending gram-negative fraud sensitivity .  Chronic respiratory failure/tracheostomy dependent - Oxygen demand at baseline, remains on aerosolized trach collar. - Continue with tracheostomy care, when necessary nebulizers.  Pulmonary embolism/left lower extremity DVT - CTA chest significant for extensive bilateral pulmonary embolism extending into lobar, segmental and subsegmental sinus branches throughout the lungs  bilaterally. - Patient with recent intracranial hemorrhage, this was discussed with her primary neurosurgeon Dr. 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 - This was discussed with patient sister and son , explained risks versus benefits, and they are agreeable to plan. - Venous Doppler significant for left lower extremity DVT venous Doppler right - Heparin GTT currently on hold given hematochezia developed overnight.  Lower GI bleed - In the setting of heparin drip secondary to bilateral PE. - GI consult appreciated, plan for colonoscopy in a.m. - Monitor hemoglobin closely and transfuse as needed. - Continue to hold heparin drip giving her significant hematochezia. - Question about vaginal bleed, patient with known history of hysterectomy, as well patient with hematochezia on digital rectal exam, pelvic ultrasound pending.  Recent intracranial hemorrhage - Continue with supportive care. - Continue with tube feed - Remove staples from surgical site  Diabetes mellitus - Continue with insulin sliding scale every 4 hours  Hypertension - Continue with metoprolol, hold amlodipine and clonidine   Code Status: Full  Family Communication: spoke with Aunt at bedside.  Disposition Plan:back to SNF when stable.   Procedures  none   Consults   none   Medications  Scheduled Meds: . bethanechol  10 mg Oral 3 times per day  . feeding supplement (PRO-STAT SUGAR FREE 64)  30 mL Per Tube BID  . fentaNYL  25 mcg Transdermal Q72H  . insulin aspart  0-9 Units Subcutaneous 6 times per day  . levETIRAcetam  500 mg Per Tube BID  . metoCLOPramide (REGLAN) injection  5 mg Intravenous Once  . [START ON 02/20/2015] metoCLOPramide (REGLAN) injection  5 mg Intravenous Once  . metoprolol tartrate  125 mg Oral BID  . piperacillin-tazobactam (ZOSYN)  IV  3.375 g Intravenous 3 times per day   . polyethylene glycol-electrolytes  2,000 mL Oral Once  . [START ON 02/20/2015] polyethylene glycol-electrolytes  2,000 mL Oral Once  . sodium chloride  3 mL Intravenous Q12H   Continuous Infusions: . dextrose 5 % and 0.9 % NaCl with KCl 20 mEq/L    . feeding supplement (JEVITY 1.2 CAL) 1,000 mL (02/18/15 2348)   PRN Meds:.albuterol  DVT Prophylaxis SCD right lower extremity  Lab Results  Component Value Date   PLT 199 02/19/2015    Antibiotics    Anti-infectives    Start     Dose/Rate Route Frequency Ordered Stop   02/18/15 0400  vancomycin (VANCOCIN) IVPB 1000 mg/200 mL premix  Status:  Discontinued     1,000 mg 200 mL/hr over 60 Minutes Intravenous Every 12 hours 02/17/15 2108 02/19/15 1120   02/17/15 2200  piperacillin-tazobactam (ZOSYN) IVPB 3.375 g     3.375 g 12.5 mL/hr over 240 Minutes Intravenous 3 times per day 02/17/15 2108     02/17/15 1530  vancomycin (VANCOCIN) 1,500 mg in sodium chloride 0.9 % 500 mL IVPB     1,500 mg 250 mL/hr over 120 Minutes Intravenous  Once 02/17/15 1518 02/17/15 1803   02/17/15 1530  piperacillin-tazobactam (ZOSYN) IVPB 3.375 g     3.375 g 100 mL/hr over 30 Minutes Intravenous  Once 02/17/15 1518 02/17/15 1600          Objective:   Filed Vitals:   02/19/15 0800 02/19/15 0803 02/19/15 0900 02/19/15 1000  BP: 125/83  135/88 122/76  Pulse: 111 50 110 105  Temp:      TempSrc:      Resp: 25 19 19 30   Height:      Weight:      SpO2: 97% 98% 100% 100%    Wt Readings from Last 3 Encounters:  02/17/15 69.1 kg (152 lb 5.4 oz)  02/14/15 72.2 kg (159 lb 2.8 oz)     Intake/Output Summary (Last 24 hours) at 02/19/15 1122 Last data filed at 02/19/15 0600  Gross per 24 hour  Intake 3448.44 ml  Output   1300 ml  Net 2148.44 ml     Physical Exam Laying in bed in no apparent distress, appears comfortable  Supple Neck,No JVD,tracheostomy +, site looks clean  Symmetrical Chest wall movement, Good air movement bilaterally, no  wheezing. RRR,No Gallops,Rubs or new Murmurs, No Parasternal Heave +ve B.Sounds, Abd Soft, No tenderness, PEG +,No rebound - guarding or rigidity. Significant amount of hematochezia on digital rectal exam. No Cyanosis, Clubbing or edema, No new Rash or bruise     Data Review   Micro Results Recent Results (from the past 240 hour(s))  Blood culture (routine x 2)     Status: None (Preliminary result)   Collection Time: 02/17/15  3:18 PM  Result Value Ref Range Status   Specimen Description BLOOD RIGHT HAND  Final   Special Requests BOTTLES DRAWN AEROBIC AND ANAEROBIC 5CC  Final   Culture  Setup Time   Final    GRAM POSITIVE COCCI IN CLUSTERS AEROBIC BOTTLE ONLY CRITICAL RESULT CALLED TO, READ BACK BY AND VERIFIED WITH: Foye Deer RN 2013 02/18/15 A BROWNING    Culture  Final    GRAM POSITIVE COCCI CULTURE REINCUBATED FOR BETTER GROWTH    Report Status PENDING  Incomplete  Blood culture (routine x 2)     Status: None (Preliminary result)   Collection Time: 02/17/15  3:20 PM  Result Value Ref Range Status   Specimen Description BLOOD BLOOD LEFT FOREARM  Final   Special Requests   Final    BOTTLES DRAWN AEROBIC AND ANAEROBIC 5CC AER 4CC ANA   Culture NO GROWTH < 24 HOURS  Final   Report Status PENDING  Incomplete  Urine culture     Status: None (Preliminary result)   Collection Time: 02/17/15  4:07 PM  Result Value Ref Range Status   Specimen Description URINE, CATHETERIZED  Final   Special Requests NONE  Final   Culture >=100,000 COLONIES/mL GRAM NEGATIVE RODS  Final   Report Status PENDING  Incomplete  MRSA PCR Screening     Status: None   Collection Time: 02/17/15  8:13 PM  Result Value Ref Range Status   MRSA by PCR NEGATIVE NEGATIVE Final    Comment:        The GeneXpert MRSA Assay (FDA approved for NASAL specimens only), is one component of a comprehensive MRSA colonization surveillance program. It is not intended to diagnose MRSA infection nor to guide or monitor  treatment for MRSA infections.   C difficile quick scan w PCR reflex     Status: None   Collection Time: 02/18/15  8:34 PM  Result Value Ref Range Status   C Diff antigen NEGATIVE NEGATIVE Final   C Diff toxin NEGATIVE NEGATIVE Final   C Diff interpretation Negative for toxigenic C. difficile  Final    Radiology Reports Ct Head Wo Contrast  02/04/2015  CLINICAL DATA:  Followup scan.  EVD clamping for 48 hours. EXAM: CT HEAD WITHOUT CONTRAST TECHNIQUE: Contiguous axial images were obtained from the base of the skull through the vertex without intravenous contrast. COMPARISON:  02/02/2015 FINDINGS: Ventriculostomy catheter position is stable, tip near the foramina [place]. Persistent frothy secretions and mucosal thickening in the sphenoid sinuses in this patient with previous nasal intubation. No new calvarial or orbital findings. 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 There is mild lateral ventriculomegaly, best seen at the temporal horns, which is stable. No third or fourth ventricular enlargement. Scattered subarachnoid hemorrhage which is much improved compared to admission and stable from most recent scan. An ovoid hematoma along the anterior interhemispheric fissure is stable in size and density is fading. Stable low-density in the bilateral inferior frontal lobes. No evidence of acute infarct. IMPRESSION: 1. Stable mild lateral ventriculomegaly. 2. Stable subarachnoid hemorrhage compared to 2 days ago. 3. Sphenoid sinusitis Electronically Signed   By: Marnee Spring M.D.   On: 02/04/2015 15:47   Ct Head Wo Contrast  02/02/2015  CLINICAL DATA:  Cerebral edema.  Aneurysm coiling. EXAM: CT HEAD WITHOUT CONTRAST TECHNIQUE: Contiguous axial images were obtained from the base of the skull through the vertex without intravenous contrast. 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 Coil remains in good position. Mild diffuse high-density subarachnoid hemorrhage is unchanged. No new hemorrhage. Right frontal ventricular catheter tip at the foramina [place] unchanged. Ventricles are slightly larger compared with the prior study. Negative for acute infarct.  No shift of the midline structures. Air-fluid level in the sphenoid sinus bilaterally  similar to the prior study. 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 No new hemorrhage. Ventricular catheter remains in good position. Mild ventricular dilatation compared with the recent CT. Electronically Signed   By: Marlan Palau M.D.   On: 02/02/2015 12:29   Ct Head Wo Contrast  01/28/2015  CLINICAL DATA:  Followup acute subarachnoid hemorrhage and coiling of anterior cerebral artery aneurysm EXAM: CT HEAD WITHOUT CONTRAST TECHNIQUE: Contiguous axial images were obtained from the base of the skull through the vertex without intravenous contrast. COMPARISON:  01/13/2015 FINDINGS: Ventriculostomy from a right frontal approach enters the frontal horn of the right lateral ventricle in has its tip in the region of the foramen of [place]. Ventricles are mildly larger than were seen on the presenting scan. Coils are present at the location of the anterior communicating artery aneurysm. Anterior interhemispheric blood collide is becoming smaller and less dense. Diffuse subarachnoid hemorrhage is becoming less dense. No definable hyper acute hemorrhage. No convexity subdural or effusion. Layering fluid is present in the sphenoid sinus. No CT evidence of ischemic infarction. IMPRESSION: Previous coiling of anterior communicating artery aneurysm. Right frontal ventriculostomy. Ventricular size is slightly larger than on the presenting examination. Diminishing density of diffuse subarachnoid blood  and and interhemispheric hematoma anteriorly. Electronically Signed   By: Paulina Fusi M.D.   On: 01/28/2015 07:30   Ct Angio Chest Pe W/cm &/or Wo Cm  02/17/2015  CLINICAL DATA:  50 year old female with shallow respirations and shortness of breath. Low oxygen saturations. EXAM: CT ANGIOGRAPHY CHEST WITH CONTRAST TECHNIQUE: Multidetector CT imaging of the chest was performed using the standard protocol during bolus administration of intravenous contrast. Multiplanar CT image reconstructions and MIPs were obtained to evaluate the vascular anatomy. CONTRAST:  80mL OMNIPAQUE IOHEXOL 350 MG/ML SOLN COMPARISON:  No priors. FINDINGS: Mediastinum/Lymph Nodes: There are filling defects in the distal main pulmonary arteries bilaterally extending into lobar, segmental and subsegmental sized branches throughout most of the lungs. No saddle embolus is identified. Pulmonic trunk measures 2.8 cm in diameter. Right ventricle measures up to 3.2 cm in diameter. Left ventricle measures up to 4.1 cm in diameter. Mild cardiomegaly. Left ventricular concentric hypertrophy. There is no significant pericardial fluid, thickening or pericardial calcification. There is atherosclerosis of the thoracic aorta, the great vessels of the mediastinum and the coronary arteries, including calcified atherosclerotic plaque in the left anterior descending, left circumflex and right coronary arteries. No pathologically enlarged mediastinal or hilar lymph nodes. Esophagus is unremarkable in appearance. No axillary lymphadenopathy. Tracheostomy tube in position with tip terminating approximately 4.6 cm above the carina. Lungs/Pleura: Areas of dependent atelectasis are noted in lower lobes of the lungs bilaterally. No acute consolidative airspace disease. No pleural effusions. No definite suspicious appearing pulmonary nodules or masses. Upper Abdomen: Unremarkable. Musculoskeletal/Soft Tissues: There are no aggressive appearing lytic or blastic lesions  noted in the visualized portions of the skeleton. Review of the MIP images confirms the above findings. IMPRESSION: 1. Extensive pulmonary emboli involving distal main pulmonary arteries extending into lobar, segmental and subsegmental sized branches throughout the lungs bilaterally. At this time, there are no overt findings to suggest right heart strain. 2. Cardiomegaly with left ventricular concentric hypertrophy. 3. Dependent atelectasis throughout the lower lobes of the lungs bilaterally. Critical Value/emergent results were called by telephone at the time of interpretation on 02/17/2015 at 7:40 pm to Dr. Silas Flood, who verbally acknowledged these results. Electronically Signed   By: Brayton Mars.D.  On: 02/17/2015 19:42   Dg Chest Portable 1 View  02/17/2015  CLINICAL DATA:  Shortness of breath.  Trauma.  Unresponsive patient. EXAM: PORTABLE CHEST 1 VIEW COMPARISON:  02/06/2015 FINDINGS: Tracheostomy tube projects along the tracheal air column. Low lung volumes are present, causing crowding of the pulmonary vasculature. Mild enlargement of the cardiopericardial silhouette, without edema. No discrete airspace opacity identified. No pneumothorax. IMPRESSION: 1. Low lung volumes but the lungs appear clear. Tracheostomy tube noted. 2. Mild enlargement of the cardiopericardial silhouette. Electronically Signed   By: Gaylyn Rong M.D.   On: 02/17/2015 15:40   Dg Chest Port 1 View  02/06/2015  CLINICAL DATA:  Patient with history of fever. EXAM: PORTABLE CHEST 1 VIEW COMPARISON:  Chest radiograph 02/05/2015. FINDINGS: Tracheostomy tube terminates the mid trachea. Multiple monitoring leads overlie the patient. Stable enlarged cardiac and mediastinal contours. Elevation of the right hemidiaphragm. Unchanged heterogeneous opacities left lung base. No pleural effusion or pneumothorax. IMPRESSION: Unchanged retrocardiac opacity which may represent atelectasis or infection. Electronically Signed   By:  Annia Belt M.D.   On: 02/06/2015 08:51   Dg Chest Port 1 View  02/05/2015  CLINICAL DATA:  50 year old female with fever EXAM: PORTABLE CHEST 1 VIEW COMPARISON:  Prior chest x-ray obtained earlier today at 6 a.m. FINDINGS: Tracheostomy tube remains in stable position. Unchanged cardiomegaly. Unchanged bilateral retrocardiac airspace opacities with a linear configuration slightly more prominent on the left than the right remains unchanged. No acute osseous abnormality. IMPRESSION: No interval change in the appearance of the chest compared to earlier this morning. Persistent left greater than right basilar atelectasis. Electronically Signed   By: Malachy Moan M.D.   On: 02/05/2015 08:55   Dg Chest Port 1 View  02/05/2015  CLINICAL DATA:  Respiratory failure EXAM: PORTABLE CHEST 1 VIEW COMPARISON:  02/01/2015 chest radiograph. FINDINGS: Tracheostomy tube tip overlies the tracheal air column 3.5 cm above the carina. Stable cardiomediastinal silhouette with mild cardiomegaly. No pneumothorax. No pleural effusion. Stable mild platelike atelectasis at the left lung base. No pulmonary edema. IMPRESSION: Well-positioned tracheostomy tube. Stable mild cardiomegaly without overt pulmonary edema. Stable mild platelike atelectasis at the left lung base. Electronically Signed   By: Delbert Phenix M.D.   On: 02/05/2015 07:42   Dg Chest Port 1 View  02/01/2015  CLINICAL DATA:  Respiratory failure. EXAM: PORTABLE CHEST 1 VIEW COMPARISON:  January 28, 2015. FINDINGS: Stable cardiomediastinal silhouette. No pneumothorax or pleural effusion is noted. Tracheostomy tube is unchanged in position. Distal tip of feeding tube is seen in expected position of distal stomach. Stable position of right internal jugular catheter line with distal tip in expected position of upper right atrium. Both lungs are clear. The visualized skeletal structures are unremarkable. IMPRESSION: Stable support apparatus. No acute cardiopulmonary  abnormality seen. Electronically Signed   By: Lupita Raider, M.D.   On: 02/01/2015 12:21   Dg Chest Port 1 View  01/28/2015  CLINICAL DATA:  Respiratory failure. EXAM: PORTABLE CHEST 1 VIEW COMPARISON:  01/27/2015 FINDINGS: Again noted is a tracheostomy tube. Jugular central line tip is in the upper right atrium. Feeding tube extends into the abdomen. There is mild peribronchial thickening with increased densities at the lung bases. Slightly low lung volumes. Heart size is stable. IMPRESSION: Low lung volumes with mild peribronchial thickening. Cannot exclude mild interstitial edema. Support apparatuses as described. Electronically Signed   By: Richarda Overlie M.D.   On: 01/28/2015 08:04   Dg Chest [facility] [address]  01/27/2015  CLINICAL DATA:  New tracheostomy EXAM: PORTABLE CHEST 1 VIEW COMPARISON:  Chest radiograph from one day prior. FINDINGS: Tracheostomy tube tip overlies the tracheal air column just below the thoracic inlet. Enteric tube enters the stomach with the tip not seen on this image. Right internal jugular central venous catheter terminates at the cavoatrial junction. Stable cardiomediastinal silhouette with normal heart size. No pneumothorax. No pleural effusion. Stable mild left basilar atelectasis. No pulmonary edema. No new lung opacity. IMPRESSION: 1. Well-positioned tracheostomy tube. 2. Mild left basilar atelectasis. Otherwise no active cardiopulmonary disease. Electronically Signed   By: Delbert Phenix M.D.   On: 01/27/2015 11:33   Dg Chest Port 1 View  01/26/2015  CLINICAL DATA:  Respiratory failure. EXAM: PORTABLE CHEST 1 VIEW COMPARISON:  01/24/2015 FINDINGS: Endotracheal tube is 1.9 cm above the carina. Central line tip in the upper right atrium. Small nodular density in the right upper chest probably related to the ECG lead. Streaky densities in the left lower lung are suggestive for atelectasis. Heart size is normal. Negative for a pneumothorax. Nasogastric tube extends into the  abdomen. IMPRESSION: Few densities at the left lung base are suggestive for atelectasis. Otherwise, no focal airspace disease. Support apparatuses as described. Subtle nodular density in the right upper chest is probably related to the overlying support apparatus as described. Recommend attention to this area on follow up imaging. Electronically Signed   By: Richarda Overlie M.D.   On: 01/26/2015 07:42   Dg Chest Port 1 View  01/24/2015  CLINICAL DATA:  Endotracheal tube placement. EXAM: PORTABLE CHEST 1 VIEW COMPARISON:  01/23/2015 and 01/22/2015. FINDINGS: 0555 hours. The endotracheal tube has been advanced and is now 1 cm above the carina. Right IJ central venous catheter projects to the mid right atrial level. Nasogastric tube projects below the diaphragm, tip not visualized. The heart size and mediastinal contours are stable. There is stable mild atelectasis at both lung bases. No pneumothorax or significant pleural effusion identified. Multiple lines overlie the chest. IMPRESSION: The endotracheal tube is low with its tip within 1 cm of carina. For more optimal positioning, this could be withdrawn 3-4 cm. No other significant changes. Electronically Signed   By: Carey Bullocks M.D.   On: 01/24/2015 08:07   Dg Chest Port 1 View  01/23/2015  CLINICAL DATA:  Evaluate ET tube placement EXAM: PORTABLE CHEST 1 VIEW COMPARISON:  01/22/2015 FINDINGS: The endotracheal tube tip is above the carina. There is a right IJ catheter with tip in the right atrium. Nasogastric tube tip is below the field of view. Normal heart size. Mild pulmonary edema is identified, increased from previous exam. IMPRESSION: 1. Stable support apparatus. 2. Mild pulmonary edema. Electronically Signed   By: Signa Kell M.D.   On: 01/23/2015 09:55   Dg Chest Port 1 View  01/22/2015  CLINICAL DATA:  Central line placement.  Initial encounter. EXAM: PORTABLE CHEST 1 VIEW COMPARISON:  Chest radiograph performed 01/21/2015 FINDINGS: The  patient's new right IJ line is noted extending into the right atrium. This could be retracted approximately 5 cm, as deemed clinically appropriate. The endotracheal tube is seen ending 3-4 cm above the carina. A right PICC is noted ending about the proximal SVC. An enteric tube is noted extending below the diaphragm. The lungs are hypoexpanded. Mild left basilar atelectasis is noted. No pleural effusion or pneumothorax is seen. The cardiomediastinal silhouette is mildly enlarged. No acute osseous abnormalities are identified. IMPRESSION: 1. Right IJ line noted extending  into the right atrium. This could be retracted approximately 5 cm to the cavoatrial junction, as deemed clinically appropriate. 2. Lungs hypoexpanded, with mild left basilar atelectasis. 3. Mild cardiomegaly. These results were called by telephone at the time of interpretation on 01/22/2015 at 2:19 am to [facility], who verbally acknowledged these results. Electronically Signed   By: Roanna Raider M.D.   On: 01/22/2015 02:19   Dg Chest Port 1 View  01/21/2015  CLINICAL DATA:  Evaluate ET tube placement EXAM: PORTABLE CHEST 1 VIEW COMPARISON:  01/20/2015 FINDINGS: Right arm PICC line tip is in the cavoatrial junction. ET tube tip is above the carina. There is a nasogastric tube with tip in the stomach. Moderate cardiac enlargement. There is no pleural effusion or edema. No airspace consolidation. IMPRESSION: 1. ET tube tip in satisfactory position above the carina. 2. Stable cardiac enlargement. Electronically Signed   By: Signa Kell M.D.   On: 01/21/2015 07:57   Dg Abd Portable 1v  02/09/2015  CLINICAL DATA:  New onset nausea. EXAM: PORTABLE ABDOMEN - 1 VIEW COMPARISON:  01/27/2015 FINDINGS: There is a gastrostomy tube present. There is no bowel dilatation to suggest obstruction. There is no evidence of pneumoperitoneum, portal venous gas or pneumatosis. There are no pathologic calcifications along the expected course of the  ureters. The osseous structures are unremarkable. IMPRESSION: Negative. Electronically Signed   By: Elige Ko   On: 02/09/2015 08:17   Dg Abd Portable 1v  01/27/2015  CLINICAL DATA:  Patient status post feeding tube placement. EXAM: PORTABLE ABDOMEN - 1 VIEW COMPARISON:  Abdominal radiograph 01/13/2015. FINDINGS: Feeding tube is present with tip projecting at the gastric antrum. Gas is demonstrated throughout the colon. No definite evidence for free intraperitoneal air. Cardiomegaly. Focal consolidative opacity within the left lung base. IMPRESSION: Feeding tube tip projects at the gastric antrum. Focal nodular consolidative opacity within the left lung base potentially secondary to an infectious process or atelectasis. Continued radiographic follow-up is recommended to ensure resolution. Electronically Signed   By: Annia Belt M.D.   On: 01/27/2015 11:39   US Abdomen Limited Ruq  02/11/2015  CLINICAL DATA:  Elevated liver enzymes. EXAM: US ABDOMEN LIMITED - RIGHT UPPER QUADRANT COMPARISON:  None. FINDINGS: Gallbladder: No gallstones or wall thickening visualized. No sonographic Murphy sign noted by sonographer. Common bile duct: Diameter: 2.7 mm Liver: No focal lesion identified. Within normal limits in parenchymal echogenicity. IMPRESSION: Negative exam. Electronically Signed   ByMaisie Fus  Register   On: 02/11/2015 07:42     CBC  Recent Labs Lab 02/17/15 1518 02/18/15 0222 02/19/15 0130 02/19/15 0453  WBC 15.9* 13.5* 12.2* 11.9*  HGB 11.9* 9.0* 8.0* 8.0*  HCT 37.4 28.6* 25.3* 25.2*  PLT 250 218 203 199  MCV 97.1 97.3 95.8 95.8  MCH 30.9 30.6 30.3 30.4  MCHC 31.8 31.5 31.6 31.7  RDW 16.4* 16.5* 16.0* 16.0*  LYMPHSABS  --   --  2.2  --   MONOABS  --   --  1.0  --   EOSABS  --   --  0.1  --   BASOSABS  --   --  0.0  --     Chemistries   Recent Labs Lab 02/17/15 1518 02/18/15 0222 02/19/15 0453  NA 142 144 141  K 4.4 3.7 3.6  CL 103 109 108  CO2 GLUCOSE 201* 126*  205*  BUN 22* 19 13  CREATININE 0.84 0.67 0.47  CALCIUM 9.8  8.7* 8.6*  AST 27  --   --   ALT 57*  --   --   ALKPHOS 144*  --   --   BILITOT 0.1*  --   --    ------------------------------------------------------------------------------------------------------------------ estimated creatinine clearance is 81.2 mL/min (by C-G formula based on Cr of 0.47). ------------------------------------------------------------------------------------------------------------------ No results for input(s): HGBA1C in the last 72 hours. ------------------------------------------------------------------------------------------------------------------ No results for input(s): CHOL, HDL, LDLCALC, TRIG, CHOLHDL, LDLDIRECT in the last 72 hours. ------------------------------------------------------------------------------------------------------------------ No results for input(s): TSH, T4TOTAL, T3FREE, THYROIDAB in the last 72 hours.  Invalid input(s): FREET3 ------------------------------------------------------------------------------------------------------------------ No results for input(s): VITAMINB12, FOLATE, FERRITIN, TIBC, IRON, RETICCTPCT in the last 72 hours.  Coagulation profile  Recent Labs Lab 02/17/15 1636  INR 1.21    No results for input(s): DDIMER in the last 72 hours.  Cardiac Enzymes No results for input(s): CKMB, TROPONINI, MYOGLOBIN in the last 168 hours.  Invalid input(s): CK ------------------------------------------------------------------------------------------------------------------ Invalid input(s): POCBNP     Time Spent in minutes   35 minutes   Aianna Fahs M.D on 02/19/2015 at 11:22 AM  Between 7am to 7pm - Pager - [phone]  After 7pm go to [email or web address] - password [facility]  [facility]   Office  [phone]

## 2015-02-19 NOTE — Progress Notes (Signed)
02/19/2015  While changing patient she had maroon color blood from rectum. Dr Randol Kern was made aware. [facility] RN.

## 2015-02-19 NOTE — Progress Notes (Signed)
Patient had another bloody discharge with large clots from her vaginal region; MD aware.

## 2015-02-20 ENCOUNTER — Inpatient Hospital Stay (HOSPITAL_COMMUNITY): Payer: Medicaid Other

## 2015-02-20 ENCOUNTER — Encounter (HOSPITAL_COMMUNITY): Payer: Self-pay

## 2015-02-20 ENCOUNTER — Encounter (HOSPITAL_COMMUNITY)
Admission: EM | Disposition: A | Discharge status: Skilled Nursing Facility | Payer: Self-pay | Source: Home / Self Care | Attending: Internal Medicine

## 2015-02-20 DIAGNOSIS — K648 Other hemorrhoids: Secondary | ICD-10-CM | POA: Insufficient documentation

## 2015-02-20 DIAGNOSIS — K5730 Diverticulosis of large intestine without perforation or abscess without bleeding: Secondary | ICD-10-CM

## 2015-02-20 HISTORY — PX: COLONOSCOPY: SHX5424

## 2015-02-20 LAB — BASIC METABOLIC PANEL
ANION GAP: 11 (ref 5–15)
BUN: 6 mg/dL (ref 6–20)
CALCIUM: 9 mg/dL (ref 8.9–10.3)
CO2: 20 mmol/L — AB (ref 22–32)
Chloride: 113 mmol/L — ABNORMAL HIGH (ref 101–111)
Creatinine, Ser: 0.55 mg/dL (ref 0.44–1.00)
GFR calc Af Amer: >60 mL/min (ref >60)
GFR calc non Af Amer: >60 mL/min (ref >60)
GLUCOSE: 133 mg/dL — AB (ref 65–99)
Potassium: 5 mmol/L (ref 3.5–5.1)
Sodium: 144 mmol/L (ref 135–145)

## 2015-02-20 LAB — CBC
HEMATOCRIT: 32.5 % — AB (ref 36.0–46.0)
HEMOGLOBIN: 11.1 g/dL — AB (ref 12.0–15.0)
MCH: 30.7 pg (ref 26.0–34.0)
MCHC: 34.2 g/dL (ref 30.0–36.0)
MCV: 89.8 fL (ref 78.0–100.0)
Platelets: 213 10*3/uL (ref 150–400)
RBC: 3.62 MIL/uL — ABNORMAL LOW (ref 3.87–5.11)
RDW: 15.9 % — ABNORMAL HIGH (ref 11.5–15.5)
WBC: 20.1 10*3/uL — ABNORMAL HIGH (ref 4.0–10.5)

## 2015-02-20 LAB — GLUCOSE, CAPILLARY
GLUCOSE-CAPILLARY: 151 mg/dL — AB (ref 65–99)
Glucose-Capillary: 133 mg/dL — ABNORMAL HIGH (ref 65–99)
Glucose-Capillary: 112 mg/dL — ABNORMAL HIGH (ref 65–99)
Glucose-Capillary: 99 mg/dL (ref 65–99)
Glucose-Capillary: 143 mg/dL — ABNORMAL HIGH (ref 65–99)

## 2015-02-20 LAB — CULTURE, BLOOD (ROUTINE X 2)

## 2015-02-20 LAB — URINE CULTURE: Culture: >=100000

## 2015-02-20 LAB — HEMOGLOBIN AND HEMATOCRIT, BLOOD
HEMATOCRIT: 29.3 % — AB (ref 36.0–46.0)
HEMATOCRIT: 28.9 % — AB (ref 36.0–46.0)
Hemoglobin: 9.9 g/dL — ABNORMAL LOW (ref 12.0–15.0)
Hemoglobin: 9.5 g/dL — ABNORMAL LOW (ref 12.0–15.0)

## 2015-02-20 LAB — HEPARIN LEVEL (UNFRACTIONATED)

## 2015-02-20 SURGERY — COLONOSCOPY
Anesthesia: Moderate Sedation

## 2015-02-20 MED ORDER — FENTANYL CITRATE (PF) 100 MCG/2ML IJ SOLN
INTRAMUSCULAR | Status: DC | PRN
  Administered 2015-02-20 (×2): 25 ug via INTRAVENOUS

## 2015-02-20 MED ORDER — DEXTROSE 5 % IV SOLN
1.0000 g | INTRAVENOUS | Status: DC
  Administered 2015-02-20 – 2015-02-21 (×2): 1 g via INTRAVENOUS
  Filled 2015-02-20 (×3): qty 10

## 2015-02-20 MED ORDER — HYDROCORTISONE 2.5 % RE CREA
TOPICAL_CREAM | Freq: Two times a day (BID) | RECTAL | Status: DC
  Administered 2015-02-20 – 2015-02-22 (×5): via RECTAL
  Filled 2015-02-20: qty 28.35

## 2015-02-20 MED ORDER — MIDAZOLAM HCL 5 MG/ML IJ SOLN
INTRAMUSCULAR | Status: AC
  Filled 2015-02-20: qty 2

## 2015-02-20 MED ORDER — DIPHENHYDRAMINE HCL 50 MG/ML IJ SOLN
INTRAMUSCULAR | Status: AC
  Filled 2015-02-20: qty 1

## 2015-02-20 MED ORDER — FENTANYL CITRATE (PF) 100 MCG/2ML IJ SOLN
INTRAMUSCULAR | Status: AC
  Filled 2015-02-20: qty 4

## 2015-02-20 MED ORDER — MIDAZOLAM HCL 5 MG/5ML IJ SOLN
INTRAMUSCULAR | Status: DC | PRN
  Administered 2015-02-20 (×2): 2 mg via INTRAVENOUS

## 2015-02-20 MED ORDER — HEPARIN (PORCINE) IN NACL 100-0.45 UNIT/ML-% IJ SOLN
1450.0000 [IU]/h | INTRAMUSCULAR | Status: DC
  Administered 2015-02-20: 1150 [IU]/h via INTRAVENOUS
  Administered 2015-02-21 (×2): 1450 [IU]/h via INTRAVENOUS
  Filled 2015-02-20 (×3): qty 250

## 2015-02-20 MED ORDER — SODIUM CHLORIDE 0.9 % IV SOLN
INTRAVENOUS | Status: DC
  Administered 2015-02-20 – 2015-02-21 (×3): via INTRAVENOUS

## 2015-02-20 MED ORDER — SODIUM CHLORIDE 0.9 % IV SOLN: INTRAVENOUS | Status: DC

## 2015-02-20 NOTE — Progress Notes (Signed)
ANTICOAGULATION CONSULT NOTE - Initial Consult  Pharmacy Consult for heparin Indication: pulmonary embolus  No Known Allergies  Patient Measurements: Height:  (165.1 cm) Weight: 152 lb 5.4 oz (69.1 kg) IBW/kg (Calculated) : 57  Vital Signs: Temp: 98.2 F (36.8 C) (01/22 0700) Temp Source: Oral (01/22 0700) BP: 176/128 mmHg (01/22 0700) Pulse Rate: 45 (01/22 0700)  Labs:  Recent Labs  02/17/15 1518 02/17/15 1636 02/18/15 0222 02/18/15 1620 02/19/15 0130 02/19/15 0453 02/19/15 1307  HGB 11.9*  --  9.0*  --  8.0* 8.0* 7.5*  HCT 37.4  --  28.6*  --  25.3* 25.2*  --   PLT 250  --  218  --  203 199  --   LABPROT  --  15.5*  --   --   --   --   --   INR  --  1.21  --   --   --   --   --   HEPARINUNFRC  --   --   --  0.46 0.16*  --   --   CREATININE 0.84  --  0.67  --   --  0.47  --     Estimated Creatinine Clearance: 81.2 mL/min (by C-G formula based on Cr of 0.47).   Medical History: Past Medical History  Diagnosis Date  . Hypertension   . Acute respiratory failure (HCC)   . Epilepsy (HCC)   . GERD (gastroesophageal reflux disease)   . Nontraumatic subarachnoid hemorrhage (HCC)   . Urinary retention   . Dysphagia   . Hyperlipidemia   . IBS (irritable bowel syndrome)   . Diabetes mellitus without complication (HCC)     Type 2, W/o complications    Assessment: 50 yo F presents on 1/19 secondary to hypoxia. CT of chest shows extensive PE. Heparin started but now with significant hematochezia. Hgb low at 7.5, plts wnl. Currently holding heparin. Had some maroon colored blood from rectum last night. GI consulted and plan a colonoscopy on 1/22  Goal of Therapy:  Heparin level 0.3-0.7 units/ml Monitor platelets by anticoagulation protocol: Yes   Plan:  Hold heparin gtt for now F/U restart for PE when able Monitor CBC, s/s of bleed  Enzo Bi, PharmD, BCPS Clinical Pharmacist Pager [phone] 02/20/2015 8:11 AM

## 2015-02-20 NOTE — Progress Notes (Signed)
Could not get to pt.  Procedure in process in pt's room

## 2015-02-20 NOTE — Op Note (Signed)
[facility] [address] [place], [address]   COLONOSCOPY PROCEDURE REPORT  PATIENT: Rachel, Vang  MR#: [id number] BIRTHDATE: October 17, 1965 , 49  yrs. old GENDER: female ENDOSCOPIST: Iva Boop, MD, [facility] PROCEDURE DATE:  02/20/2015 PROCEDURE:   Colonoscopy, diagnostic First Screening Colonoscopy - Avg.  risk and is 50 yrs.  old or older - No.  Prior Negative Screening - Now for repeat screening. N/A  History of Adenoma - Now for follow-up colonoscopy & has been > or = to 3 yrs.  N/A  Polyps removed today? No Recommend repeat exam, <10 yrs? No ASA CLASS:   Class III INDICATIONS:Evaluation of unexplained GI bleeding and Patient is not applicable for Colorectal Neoplasm Risk Assessment for this procedure. MEDICATIONS: Fentanyl 50 mcg IV and Versed 4 mg IV   administered by RN under my supervision> 15 < 30 min  DESCRIPTION OF PROCEDURE:   After the risks benefits and alternatives of the procedure were thoroughly explained, informed consent was obtained.  The digital rectal exam revealed no abnormalities of the rectum.   The Pentax Ped Colon [id number] endoscope was introduced through the anus and advanced to the cecum, which was identified by both the appendix and ileocecal valve. No adverse events experienced.   The quality of the prep was adequate (Nulytley was used)  The instrument was then slowly withdrawn as the colon was fully examined. Estimated blood loss is zero unless otherwise noted in this procedure report.      COLON FINDINGS: There was mild diverticulosis noted in the sigmoid colon.   Internal hemorrhoids were found.   The examination was otherwise normal.  Retroflexed views revealed internal hemorrhoids. Withdrawal time = per RN notes   The scope was withdrawn and the procedure completed. COMPLICATIONS: There were no immediate complications.  ENDOSCOPIC IMPRESSION: 1.   Mild diverticulosis was noted in the sigmoid colon -  could have bled also 2.   Internal hemorrhoids - suspect cause of bleeding 3.   The examination was otherwise normal  RECOMMENDATIONS: Treat hemorrhoids w/ hydrocortisone cream, resume heparin for bilateral pulmonary emboli  eSigned:  Iva Boop, MD, [facility] 02/20/2015 1:02 PM

## 2015-02-20 NOTE — Brief Op Note (Addendum)
02/17/2015 - 02/20/2015  12:06 PM  PATIENT:  Rachel Vang  50 y.o. female  PRE-OPERATIVE DIAGNOSIS:  Lower GI bleed  POST-OPERATIVE DIAGNOSIS:  diverticulosis and hemorrhoids  PROCEDURE:  Procedure(s): COLONOSCOPY (N/A)  SURGEON:  Surgeon(s) and Role:    * Iva Boop, MD - Primary   ANESTHESIA:   Moderate sedation 50 uq fentanyl and 4 mg Versed  Findings - exam to cecum  Adequate prep  1) Internal hemorrhoids 2) Mild sigmoid diverticulosis 3) Otherwise normal  Think bleeding probably hemorrhoidal - anorectal   Will Tx hemorrhoids with hydrocortisone cream  Dr. Loreta Ave or [place] can f/u prn

## 2015-02-20 NOTE — Progress Notes (Signed)
Pharmacy Antibiotic Follow-up Note  Rachel Vang is a 50 y.o. year-old female admitted on 02/17/2015.  The patient is currently on day #4 of abx for sepsis.  Assessment/Plan: Day #4 of abx for sepsis. Afebrile, WBC elevated at 11.9. Awaiting sensitivities to urine cx to de-escalate abx.  Plan: Continue Zosyn 3.375 gm IV q8h (4 hour infusion) Monitor clinical picture, renal function F/U C&S, abx deescalation / LOT  Temp (24hrs), Avg:98.5 F (36.9 C), Min:97.8 F (36.6 C), Max:99.1 F (37.3 C)   Recent Labs Lab 02/17/15 1518 02/18/15 0222 02/19/15 0130 02/19/15 0453  WBC 15.9* 13.5* 12.2* 11.9*    Recent Labs Lab 02/17/15 1518 02/18/15 0222 02/19/15 0453  CREATININE 0.84 0.67 0.47   Estimated Creatinine Clearance: 81.2 mL/min (by C-G formula based on Cr of 0.47).    No Known Allergies  Antimicrobials this admission: 1/19 vanc >> 1/21 1/19 zosyn >>  Microbiology results: Blood cx > 1/2 GPC in clusters (most likely contaminant) Urine cx > 100k of E. coli  Thank you for allowing pharmacy to be a part of this patient's care.  Enzo Bi, PharmD, BCPS Clinical Pharmacist Pager [phone] 02/20/2015 8:12 AM

## 2015-02-20 NOTE — Progress Notes (Signed)
ANTICOAGULATION CONSULT NOTE - Follow Up Consult  Pharmacy Consult for heparin Indication: pulmonary embolus  No Known Allergies  Patient Measurements: Height:  (165.1 cm) Weight: 152 lb 5.4 oz (69.1 kg) IBW/kg (Calculated) : 57  Vital Signs: Temp: 98.2 F (36.8 C) (01/22 0700) Temp Source: Oral (01/22 0700) BP: 129/72 mmHg (01/22 1205) Pulse Rate: 94 (01/22 1205)  Labs:  Recent Labs  02/17/15 1636 02/18/15 0222 02/18/15 1620 02/19/15 0130 02/19/15 0453  02/20/15 0948 02/20/15 1053  HGB  --  9.0*  --  8.0* 8.0*  < > 11.1* 9.9*  HCT  --  28.6*  --  25.3* 25.2*  --  32.5* 29.3*  PLT  --  218  --  203 199  --  213  --   LABPROT 15.5*  --   --   --   --   --   --   --   INR 1.21  --   --   --   --   --   --   --   HEPARINUNFRC  --   --  0.46 0.16*  --   --   --   --   CREATININE  --  0.67  --   --  0.47  --  0.55  --   < > = values in this interval not displayed.  Estimated Creatinine Clearance: 81.2 mL/min (by C-G formula based on Cr of 0.55).  Assessment: 50 yo F presents on 1/19 secondary to hypoxia. CT of chest shows extensive PE. Heparin started but now with significant hematochezia. Hgb low at 7.5, plts wnl. Currently holding heparin. Had some maroon colored blood from rectum last night. GI consulted and did colonoscopy today which showed internal hemorrhoids, mild sigmoid diverticulosis, but otherwise normal. Plan to restart heparin gtt now per GI.  Goal of Therapy:  Heparin level 0.3-0.7 units/ml Monitor platelets by anticoagulation protocol: Yes   Plan:  No heparin BOLUS Restart heparin gtt at 1,150 units/hr Check 6 hr HL Monitor daily HL, CBC, s/s of bleed  Enzo Bi, PharmD, BCPS Clinical Pharmacist Pager [phone] 02/20/2015 1:26 PM

## 2015-02-20 NOTE — Progress Notes (Signed)
ANTICOAGULATION CONSULT NOTE - Follow Up Consult  Pharmacy Consult for heparin Indication: pulmonary embolus  No Known Allergies  Patient Measurements: Height:  (165.1 cm) Weight: 152 lb 5.4 oz (69.1 kg) IBW/kg (Calculated) : 57  Heparin Dosing Weight: 69.1 kg  Vital Signs: Temp: 98.9 F (37.2 C) (01/22 2011) Temp Source: Oral (01/22 2011) BP: 148/97 mmHg (01/22 2147) Pulse Rate: 115 (01/22 2147)  Labs:  Recent Labs  02/18/15 0222 02/18/15 1620 02/19/15 0130 02/19/15 0453  02/20/15 0948 02/20/15 1053 02/20/15 1833 02/20/15 2036  HGB 9.0*  --  8.0* 8.0*  < > 11.1* 9.9* 9.5*  --   HCT 28.6*  --  25.3* 25.2*  --  32.5* 29.3* 28.9*  --   PLT 218  --  203 199  --  213  --   --   --   HEPARINUNFRC  --  0.46 0.16*  --   --   --   --   --  <0.10*  CREATININE 0.67  --   --  0.47  --  0.55  --   --   --   < > = values in this interval not displayed.  Estimated Creatinine Clearance: 81.2 mL/min (by C-G formula based on Cr of 0.55).  Assessment: 50 yo F presents on 1/19 secondary to hypoxia. CT of chest shows extensive PE. Heparin started but then held with significant hematochezia. Hgb low at 7.5, plts wnl.  Had some maroon colored blood from rectum last night. GI consulted and did colonoscopy today which showed internal hemorrhoids, mild sigmoid diverticulosis, but otherwise normal. Heparin restarted per GI.   Heparin level this evening is SUBtherapeutic (HL <0.1, goal of 0.3-0.7). RN reports some minimal rectal bleeding thought to be more related to hemorrhoids now. No interruptions in the drip noted per RN report.   Goal of Therapy:  Heparin level 0.3-0.7 units/ml Monitor platelets by anticoagulation protocol: Yes   Plan:  1.Increase Heparin to 1350 units/hr (13.5 ml/hr) 2. Will continue to monitor for any signs/symptoms of bleeding and will follow up with heparin level in 6 hours   Georgina Pillion, PharmD, BCPS Clinical Pharmacist Pager: [phone] 02/20/2015  10:05 PM

## 2015-02-20 NOTE — Progress Notes (Signed)
Patient Demographics  Rachel Vang, is a 50 y.o. female, DOB - [birth year], ZOX:[id number]  Admit date - 02/17/2015   Admitting Physician Starleen Arms, MD  Outpatient Primary MD for the patient is No primary care provider on file.  LOS - 3   Chief Complaint  Patient presents with  . Shortness of Breath       Admission HPI/Brief narrative: 50 y.o. female, with past medical history of hypertension, recent lengthy hospitalization, secondary to intracranial hemorrhage, status post tracheostomy, PEG tube and chronic Foley, discharged on 02/14/2015 to SNF, presents with respiratory distress, CTA chest significant for extensive bilateral PE, as well sepsis secondary to UTI, cleared by neurosurgery to start on anticoagulation, she developed lower GI bleed(hematochezia) on heparin drip , required 2 units PRBC on 1/21 ,colonoscopy significant for internal hemorrhoid, resumed on anticoagulation.  Subjective:   Rachel Vang today on communicative, can't provide any complaints . Assessment & Plan    Principal Problem:   Sepsis (HCC) Active Problems:   ICH (intracerebral hemorrhage) (HCC)   Acute and chronic respiratory failure with hypoxia (HCC)   Diabetes mellitus (HCC)   Acute pulmonary embolism (HCC)   DVT (deep vein thrombosis) in pregnancy   Lower GI bleeding   Hemorrhoids with complication   Diverticulosis of colon without hemorrhage  Sepsis  - Presents with fever, tachypnea, tachycardia and leukocytosis  - Most likely related to UTI ,  - urine culture growing Escherichia coli and Enterobacter, both of them sensitive to Rocephin. - Blood culture growing Staphylococcus coag negative, most likely contaminant, no indication for treatment. - On broad-spectrum antibiotics vancomycin and Zosyn, IV vancomycin stopped 1/21, will transition Zosyn to Rocephin today  - Leukocytosis of 20,000, most  likely reactive secondary to transfusion, recheck in a.m. she is afebrile.  Chronic respiratory failure/tracheostomy dependent - Oxygen demand at baseline, remains on aerosolized trach collar. - Continue with tracheostomy care, when necessary nebulizers.  Pulmonary embolism/left lower extremity DVT - CTA chest significant for extensive bilateral pulmonary embolism extending into lobar, segmental and subsegmental sinus branches throughout the lungs bilaterally. - Patient with recent intracranial hemorrhage, this was discussed with her primary neurosurgeon Dr. 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 - This was discussed with patient sister and son , explained risks versus benefits, and they are agreeable to plan. - Venous Doppler significant for left lower extremity DVT venous Doppler right - Resume on heparin GTT status post colonoscopy.  Lower GI bleed - In the setting of heparin drip secondary to bilateral PE. - GI consult appreciated, colonoscopy 1/22 significant for diverticulosis and internal hemorrhoid, no evidence of active GI bleed. - Transfused 2 units PRBC on 1/21. - Monitor hemoglobin closely and transfuse as needed. - Unlikely vaginal bleed , patient with known history of hysterectomy, as well patient with hematochezia on digital rectal exam, pelvic ultrasound confirms hysterectomy history.  Recent intracranial hemorrhage - Continue with supportive care. - Continue with tube feed - Remove staples from surgical site  Diabetes mellitus - Continue with insulin sliding scale every 4 hours  Hypertension - Continue with metoprolol, hold amlodipine and clonidine   Code Status: Full  Family Communication:  spoke with son at bedside.  Disposition Plan:back to SNF when stable.  Overall guarded prognosis in this female with extensive bilateral PE, evidence of GI bleed  with anticoagulation, as well vegetative  State, with high chance of recurrence of her UTI, aspiration, pneumonia, pressure ulcer.   Procedures  Colonoscopy 1/22 by Dr. Leone Payor 2. PRBC transfusion 1/21 Consults   GI   Medications  Scheduled Meds: . bethanechol  10 mg Oral 3 times per day  . feeding supplement (PRO-STAT SUGAR FREE 64)  30 mL Per Tube BID  . fentaNYL  25 mcg Transdermal Q72H  . hydrocortisone   Rectal BID  . insulin aspart  0-9 Units Subcutaneous 6 times per day  . levETIRAcetam  500 mg Per Tube BID  . metoprolol tartrate  125 mg Oral BID  . piperacillin-tazobactam (ZOSYN)  IV  3.375 g Intravenous 3 times per day  . sodium chloride  3 mL Intravenous Q12H   Continuous Infusions: . sodium chloride    . feeding supplement (JEVITY 1.2 CAL) 1,000 mL (02/18/15 2348)  . heparin     PRN Meds:.albuterol  DVT Prophylaxis SCD right lower extremity  Lab Results  Component Value Date   PLT 213 02/20/2015    Antibiotics    Anti-infectives    Start     Dose/Rate Route Frequency Ordered Stop   02/18/15 0400  vancomycin (VANCOCIN) IVPB 1000 mg/200 mL premix  Status:  Discontinued     1,000 mg 200 mL/hr over 60 Minutes Intravenous Every 12 hours 02/17/15 2108 02/19/15 1120   02/17/15 2200  piperacillin-tazobactam (ZOSYN) IVPB 3.375 g     3.375 g 12.5 mL/hr over 240 Minutes Intravenous 3 times per day 02/17/15 2108     02/17/15 1530  vancomycin (VANCOCIN) 1,500 mg in sodium chloride 0.9 % 500 mL IVPB     1,500 mg 250 mL/hr over 120 Minutes Intravenous  Once 02/17/15 1518 02/17/15 1803   02/17/15 1530  piperacillin-tazobactam (ZOSYN) IVPB 3.375 g     3.375 g 100 mL/hr over 30 Minutes Intravenous  Once 02/17/15 1518 02/17/15 1600          Objective:   Filed Vitals:   02/20/15 1150 02/20/15 1200 02/20/15 1205 02/20/15 1300  BP: 142/85 135/68 129/72 137/84  Pulse: 96 95 94 92  Temp:    98.5 F (36.9 C)  TempSrc:    Axillary  Resp: 38 46 49 25  Height:       Weight:      SpO2: 99% 100% 98% 100%    Wt Readings from Last 3 Encounters:  02/17/15 69.1 kg (152 lb 5.4 oz)  02/14/15 72.2 kg (159 lb 2.8 oz)     Intake/Output Summary (Last 24 hours) at 02/20/15 1405 Last data filed at 02/20/15 4696  Gross per 24 hour  Intake   2625 ml  Output   2650 ml  Net    -25 ml     Physical Exam Laying in bed in no apparent distress, appears comfortable  Supple Neck,No JVD,tracheostomy +, site looks clean  Symmetrical Chest wall movement, Good air movement bilaterally, no wheezing. RRR,No Gallops,Rubs or new Murmurs, No Parasternal Heave +ve B.Sounds, Abd Soft, No tenderness, PEG +,No rebound - guarding or rigidity.  No Cyanosis, Clubbing or edema, No new Rash or bruise     Data Review   Micro Results Recent Results (from the past 240 hour(s))  Blood culture (routine x 2)     Status: None   Collection  Time: 02/17/15  3:18 PM  Result Value Ref Range Status   Specimen Description BLOOD RIGHT HAND  Final   Special Requests BOTTLES DRAWN AEROBIC AND ANAEROBIC 5CC  Final   Culture  Setup Time   Final    GRAM POSITIVE COCCI IN CLUSTERS AEROBIC BOTTLE ONLY CRITICAL RESULT CALLED TO, READ BACK BY AND VERIFIED WITH: Foye Deer RN 2013 02/18/15 A BROWNING    Culture   Final    STAPHYLOCOCCUS SPECIES (COAGULASE NEGATIVE) THE SIGNIFICANCE OF ISOLATING THIS ORGANISM FROM A SINGLE SET OF BLOOD CULTURES WHEN MULTIPLE SETS ARE DRAWN IS UNCERTAIN. PLEASE NOTIFY THE MICROBIOLOGY DEPARTMENT WITHIN ONE WEEK IF SPECIATION AND SENSITIVITIES ARE REQUIRED.    Report Status 02/20/2015 FINAL  Final  Blood culture (routine x 2)     Status: None (Preliminary result)   Collection Time: 02/17/15  3:20 PM  Result Value Ref Range Status   Specimen Description BLOOD BLOOD LEFT FOREARM  Final   Special Requests   Final    BOTTLES DRAWN AEROBIC AND ANAEROBIC 5CC AER 4CC ANA   Culture NO GROWTH 2 DAYS  Final   Report Status PENDING  Incomplete  Urine culture     Status:  None   Collection Time: 02/17/15  4:07 PM  Result Value Ref Range Status   Specimen Description URINE, CATHETERIZED  Final   Special Requests NONE  Final   Culture   Final    >=100,000 COLONIES/mL ESCHERICHIA COLI >=100,000 COLONIES/mL ENTEROBACTER CLOACAE    Report Status 02/20/2015 FINAL  Final   Organism ID, Bacteria ESCHERICHIA COLI  Final   Organism ID, Bacteria ENTEROBACTER CLOACAE  Final      Susceptibility   Enterobacter cloacae - MIC*    CEFAZOLIN >=64 RESISTANT Resistant     CEFTRIAXONE <=1 SENSITIVE Sensitive     CIPROFLOXACIN <=0.25 SENSITIVE Sensitive     GENTAMICIN <=1 SENSITIVE Sensitive     IMIPENEM 0.5 SENSITIVE Sensitive     NITROFURANTOIN 64 INTERMEDIATE Intermediate     TRIMETH/SULFA <=20 SENSITIVE Sensitive     PIP/TAZO 8 SENSITIVE Sensitive     * >=100,000 COLONIES/mL ENTEROBACTER CLOACAE   Escherichia coli - MIC*    AMPICILLIN 8 SENSITIVE Sensitive     CEFAZOLIN <=4 SENSITIVE Sensitive     CEFTRIAXONE <=1 SENSITIVE Sensitive     CIPROFLOXACIN <=0.25 SENSITIVE Sensitive     GENTAMICIN <=1 SENSITIVE Sensitive     IMIPENEM <=0.25 SENSITIVE Sensitive     NITROFURANTOIN <=16 SENSITIVE Sensitive     TRIMETH/SULFA <=20 SENSITIVE Sensitive     AMPICILLIN/SULBACTAM 4 SENSITIVE Sensitive     PIP/TAZO <=4 SENSITIVE Sensitive     * >=100,000 COLONIES/mL ESCHERICHIA COLI  MRSA PCR Screening     Status: None   Collection Time: 02/17/15  8:13 PM  Result Value Ref Range Status   MRSA by PCR NEGATIVE NEGATIVE Final    Comment:        The GeneXpert MRSA Assay (FDA approved for NASAL specimens only), is one component of a comprehensive MRSA colonization surveillance program. It is not intended to diagnose MRSA infection nor to guide or monitor treatment for MRSA infections.   C difficile quick scan w PCR reflex     Status: None   Collection Time: 02/18/15  8:34 PM  Result Value Ref Range Status   C Diff antigen NEGATIVE NEGATIVE Final   C Diff toxin NEGATIVE  NEGATIVE Final   C Diff interpretation Negative for toxigenic C. difficile  Final  Radiology Reports Ct Head Wo Contrast  02/04/2015  CLINICAL DATA:  Followup scan.  EVD clamping for 48 hours. EXAM: CT HEAD WITHOUT CONTRAST TECHNIQUE: Contiguous axial images were obtained from the base of the skull through the vertex without intravenous contrast. COMPARISON:  02/02/2015 FINDINGS: Ventriculostomy catheter position is stable, tip near the foramina [place]. Persistent frothy secretions and mucosal thickening in the sphenoid sinuses in this patient with previous nasal intubation. No new calvarial or orbital findings. 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 There is mild lateral ventriculomegaly, best seen at the temporal horns, which is stable. No third or fourth ventricular enlargement. Scattered subarachnoid hemorrhage which is much improved compared to admission and stable from most recent scan. An ovoid hematoma along the anterior interhemispheric fissure is stable in size and density is fading. Stable low-density in the bilateral inferior frontal lobes. No evidence of acute infarct. IMPRESSION: 1. Stable mild lateral ventriculomegaly. 2. Stable subarachnoid hemorrhage compared to 2 days ago. 3. Sphenoid sinusitis Electronically Signed   By: Marnee Spring M.D.   On: 02/04/2015 15:47   Ct Head Wo Contrast  02/02/2015  CLINICAL DATA:  Cerebral edema.  Aneurysm coiling. EXAM: CT HEAD WITHOUT CONTRAST TECHNIQUE: Contiguous axial images were obtained from the base of the skull through the vertex without intravenous contrast. 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 Coil remains in good position. Mild diffuse high-density subarachnoid hemorrhage is unchanged. No new hemorrhage. Right frontal  ventricular catheter tip at the foramina [place] unchanged. Ventricles are slightly larger compared with the prior study. Negative for acute infarct.  No shift of the midline structures. Air-fluid level in the sphenoid sinus bilaterally similar to the prior study. 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 No new hemorrhage. Ventricular catheter remains in good position. Mild ventricular dilatation compared with the recent CT. Electronically Signed   By: Marlan Palau M.D.   On: 02/02/2015 12:29   Ct Head Wo Contrast  01/28/2015  CLINICAL DATA:  Followup acute subarachnoid hemorrhage and coiling of anterior cerebral artery aneurysm EXAM: CT HEAD WITHOUT CONTRAST TECHNIQUE: Contiguous axial images were obtained from the base of the skull through the vertex without intravenous contrast. COMPARISON:  01/13/2015 FINDINGS: Ventriculostomy from a right frontal approach enters the frontal horn of the right lateral ventricle in has its tip in the region of the foramen of [place]. Ventricles are mildly larger than were seen on the presenting scan. Coils are present at the location of the anterior communicating artery aneurysm. Anterior interhemispheric blood collide is becoming smaller and less dense. Diffuse subarachnoid hemorrhage is becoming less dense. No definable hyper acute hemorrhage. No convexity subdural or effusion. Layering fluid is present in the sphenoid sinus. No CT evidence of ischemic infarction. IMPRESSION: Previous coiling of anterior communicating artery aneurysm. Right frontal ventriculostomy. Ventricular size is slightly larger than on the presenting examination. Diminishing density of diffuse subarachnoid blood and and interhemispheric hematoma anteriorly. Electronically Signed   By: Paulina Fusi M.D.   On: 01/28/2015 07:30   Ct Angio Chest Pe W/cm &/or Wo Cm  02/17/2015  CLINICAL DATA:  50 year old female with shallow  respirations and shortness of breath. Low oxygen saturations. EXAM: CT ANGIOGRAPHY CHEST WITH CONTRAST TECHNIQUE: Multidetector CT imaging of the chest was performed using the standard protocol during bolus administration of intravenous contrast. Multiplanar CT image  reconstructions and MIPs were obtained to evaluate the vascular anatomy. CONTRAST:  80mL OMNIPAQUE IOHEXOL 350 MG/ML SOLN COMPARISON:  No priors. FINDINGS: Mediastinum/Lymph Nodes: There are filling defects in the distal main pulmonary arteries bilaterally extending into lobar, segmental and subsegmental sized branches throughout most of the lungs. No saddle embolus is identified. Pulmonic trunk measures 2.8 cm in diameter. Right ventricle measures up to 3.2 cm in diameter. Left ventricle measures up to 4.1 cm in diameter. Mild cardiomegaly. Left ventricular concentric hypertrophy. There is no significant pericardial fluid, thickening or pericardial calcification. There is atherosclerosis of the thoracic aorta, the great vessels of the mediastinum and the coronary arteries, including calcified atherosclerotic plaque in the left anterior descending, left circumflex and right coronary arteries. No pathologically enlarged mediastinal or hilar lymph nodes. Esophagus is unremarkable in appearance. No axillary lymphadenopathy. Tracheostomy tube in position with tip terminating approximately 4.6 cm above the carina. Lungs/Pleura: Areas of dependent atelectasis are noted in lower lobes of the lungs bilaterally. No acute consolidative airspace disease. No pleural effusions. No definite suspicious appearing pulmonary nodules or masses. Upper Abdomen: Unremarkable. Musculoskeletal/Soft Tissues: There are no aggressive appearing lytic or blastic lesions noted in the visualized portions of the skeleton. Review of the MIP images confirms the above findings. IMPRESSION: 1. Extensive pulmonary emboli involving distal main pulmonary arteries extending into lobar,  segmental and subsegmental sized branches throughout the lungs bilaterally. At this time, there are no overt findings to suggest right heart strain. 2. Cardiomegaly with left ventricular concentric hypertrophy. 3. Dependent atelectasis throughout the lower lobes of the lungs bilaterally. Critical Value/emergent results were called by telephone at the time of interpretation on 02/17/2015 at 7:40 pm to Dr. Silas Flood, who verbally acknowledged these results. Electronically Signed   By: Trudie Reed M.D.   On: 02/17/2015 19:42   US Pelvis Complete  02/19/2015  CLINICAL DATA:  Bleeding. Unsure of bleeding is vaginal or rectal. Verify history of hysterectomy. EXAM: TRANSABDOMINAL ULTRASOUND OF PELVIS TECHNIQUE: Transabdominal ultrasound examination of the pelvis was performed including evaluation of the uterus, ovaries, adnexal regions, and pelvic cul-de-sac. COMPARISON:  None. FINDINGS: Uterus Not seen, presumed hysterectomy. No mass or fluid identified within the midline pelvis. Right ovary Not seen. No mass or free fluid identified in the right adnexal region. Fairly prominent fluid-filled bowel loops are seen within the right pelvis. Left ovary Not seen. No mass or free fluid identified in the left adnexal region. Fairly prominent fluid-filled bowel loops are also appreciated in the left hemipelvis. Other findings:  No free fluid seen. IMPRESSION: 1. Uterus not identified, presumed hysterectomy. No mass or free fluid seen within the midline pelvis. 2. Neither ovary is seen, perhaps bilateral oophorectomies, perhaps obscured by the fairly prominent fluid-filled bowel loops in the pelvis. No mass or free fluid seen within either adnexal region. Electronically Signed   By: Bary Richard M.D.   On: 02/19/2015 16:25   Dg Chest Portable 1 View  02/20/2015  CLINICAL DATA:  50 year old female with possible aspiration. EXAM: PORTABLE CHEST 1 VIEW COMPARISON:  Radiograph dated 02/17/2015 and chest CT dated 02/17/2015  for go FINDINGS: Tracheostomy with tip above the carina. Single portable view of the chest does not demonstrate a focal consolidation. There is no pleural effusion or pneumothorax. Mild cardiomegaly. No acute osseous pathology. IMPRESSION: No focal consolidation. Electronically Signed   By: Elgie Collard M.D.   On: 02/20/2015 06:04   Dg Chest Portable 1 View  02/17/2015  CLINICAL DATA:  Shortness of breath.  Trauma.  Unresponsive patient. EXAM: PORTABLE CHEST 1 VIEW COMPARISON:  02/06/2015 FINDINGS: Tracheostomy tube projects along the tracheal air column. Low lung volumes are present, causing crowding of the pulmonary vasculature. Mild enlargement of the cardiopericardial silhouette, without edema. No discrete airspace opacity identified. No pneumothorax. IMPRESSION: 1. Low lung volumes but the lungs appear clear. Tracheostomy tube noted. 2. Mild enlargement of the cardiopericardial silhouette. Electronically Signed   By: Gaylyn Rong M.D.   On: 02/17/2015 15:40   Dg Chest Port 1 View  02/06/2015  CLINICAL DATA:  Patient with history of fever. EXAM: PORTABLE CHEST 1 VIEW COMPARISON:  Chest radiograph 02/05/2015. FINDINGS: Tracheostomy tube terminates the mid trachea. Multiple monitoring leads overlie the patient. Stable enlarged cardiac and mediastinal contours. Elevation of the right hemidiaphragm. Unchanged heterogeneous opacities left lung base. No pleural effusion or pneumothorax. IMPRESSION: Unchanged retrocardiac opacity which may represent atelectasis or infection. Electronically Signed   By: Annia Belt M.D.   On: 02/06/2015 08:51   Dg Chest Port 1 View  02/05/2015  CLINICAL DATA:  50 year old female with fever EXAM: PORTABLE CHEST 1 VIEW COMPARISON:  Prior chest x-ray obtained earlier today at 6 a.m. FINDINGS: Tracheostomy tube remains in stable position. Unchanged cardiomegaly. Unchanged bilateral retrocardiac airspace opacities with a linear configuration slightly more prominent on the  left than the right remains unchanged. No acute osseous abnormality. IMPRESSION: No interval change in the appearance of the chest compared to earlier this morning. Persistent left greater than right basilar atelectasis. Electronically Signed   By: Malachy Moan M.D.   On: 02/05/2015 08:55   Dg Chest Port 1 View  02/05/2015  CLINICAL DATA:  Respiratory failure EXAM: PORTABLE CHEST 1 VIEW COMPARISON:  02/01/2015 chest radiograph. FINDINGS: Tracheostomy tube tip overlies the tracheal air column 3.5 cm above the carina. Stable cardiomediastinal silhouette with mild cardiomegaly. No pneumothorax. No pleural effusion. Stable mild platelike atelectasis at the left lung base. No pulmonary edema. IMPRESSION: Well-positioned tracheostomy tube. Stable mild cardiomegaly without overt pulmonary edema. Stable mild platelike atelectasis at the left lung base. Electronically Signed   By: Delbert Phenix M.D.   On: 02/05/2015 07:42   Dg Chest Port 1 View  02/01/2015  CLINICAL DATA:  Respiratory failure. EXAM: PORTABLE CHEST 1 VIEW COMPARISON:  January 28, 2015. FINDINGS: Stable cardiomediastinal silhouette. No pneumothorax or pleural effusion is noted. Tracheostomy tube is unchanged in position. Distal tip of feeding tube is seen in expected position of distal stomach. Stable position of right internal jugular catheter line with distal tip in expected position of upper right atrium. Both lungs are clear. The visualized skeletal structures are unremarkable. IMPRESSION: Stable support apparatus. No acute cardiopulmonary abnormality seen. Electronically Signed   By: Lupita Raider, M.D.   On: 02/01/2015 12:21   Dg Chest Port 1 View  01/28/2015  CLINICAL DATA:  Respiratory failure. EXAM: PORTABLE CHEST 1 VIEW COMPARISON:  01/27/2015 FINDINGS: Again noted is a tracheostomy tube. Jugular central line tip is in the upper right atrium. Feeding tube extends into the abdomen. There is mild peribronchial thickening with increased  densities at the lung bases. Slightly low lung volumes. Heart size is stable. IMPRESSION: Low lung volumes with mild peribronchial thickening. Cannot exclude mild interstitial edema. Support apparatuses as described. Electronically Signed   By: Richarda Overlie M.D.   On: 01/28/2015 08:04   Dg Chest Port 1 View  01/27/2015  CLINICAL DATA:  New tracheostomy EXAM: PORTABLE CHEST 1 VIEW COMPARISON:  Chest radiograph from one day prior.  FINDINGS: Tracheostomy tube tip overlies the tracheal air column just below the thoracic inlet. Enteric tube enters the stomach with the tip not seen on this image. Right internal jugular central venous catheter terminates at the cavoatrial junction. Stable cardiomediastinal silhouette with normal heart size. No pneumothorax. No pleural effusion. Stable mild left basilar atelectasis. No pulmonary edema. No new lung opacity. IMPRESSION: 1. Well-positioned tracheostomy tube. 2. Mild left basilar atelectasis. Otherwise no active cardiopulmonary disease. Electronically Signed   By: Delbert Phenix M.D.   On: 01/27/2015 11:33   Dg Chest Port 1 View  01/26/2015  CLINICAL DATA:  Respiratory failure. EXAM: PORTABLE CHEST 1 VIEW COMPARISON:  01/24/2015 FINDINGS: Endotracheal tube is 1.9 cm above the carina. Central line tip in the upper right atrium. Small nodular density in the right upper chest probably related to the ECG lead. Streaky densities in the left lower lung are suggestive for atelectasis. Heart size is normal. Negative for a pneumothorax. Nasogastric tube extends into the abdomen. IMPRESSION: Few densities at the left lung base are suggestive for atelectasis. Otherwise, no focal airspace disease. Support apparatuses as described. Subtle nodular density in the right upper chest is probably related to the overlying support apparatus as described. Recommend attention to this area on follow up imaging. Electronically Signed   By: Richarda Overlie M.D.   On: 01/26/2015 07:42   Dg Chest Port 1  View  01/24/2015  CLINICAL DATA:  Endotracheal tube placement. EXAM: PORTABLE CHEST 1 VIEW COMPARISON:  01/23/2015 and 01/22/2015. FINDINGS: 0555 hours. The endotracheal tube has been advanced and is now 1 cm above the carina. Right IJ central venous catheter projects to the mid right atrial level. Nasogastric tube projects below the diaphragm, tip not visualized. The heart size and mediastinal contours are stable. There is stable mild atelectasis at both lung bases. No pneumothorax or significant pleural effusion identified. Multiple lines overlie the chest. IMPRESSION: The endotracheal tube is low with its tip within 1 cm of carina. For more optimal positioning, this could be withdrawn 3-4 cm. No other significant changes. Electronically Signed   By: Carey Bullocks M.D.   On: 01/24/2015 08:07   Dg Chest Port 1 View  01/23/2015  CLINICAL DATA:  Evaluate ET tube placement EXAM: PORTABLE CHEST 1 VIEW COMPARISON:  01/22/2015 FINDINGS: The endotracheal tube tip is above the carina. There is a right IJ catheter with tip in the right atrium. Nasogastric tube tip is below the field of view. Normal heart size. Mild pulmonary edema is identified, increased from previous exam. IMPRESSION: 1. Stable support apparatus. 2. Mild pulmonary edema. Electronically Signed   By: Signa Kell M.D.   On: 01/23/2015 09:55   Dg Chest Port 1 View  01/22/2015  CLINICAL DATA:  Central line placement.  Initial encounter. EXAM: PORTABLE CHEST 1 VIEW COMPARISON:  Chest radiograph performed 01/21/2015 FINDINGS: The patient's new right IJ line is noted extending into the right atrium. This could be retracted approximately 5 cm, as deemed clinically appropriate. The endotracheal tube is seen ending 3-4 cm above the carina. A right PICC is noted ending about the proximal SVC. An enteric tube is noted extending below the diaphragm. The lungs are hypoexpanded. Mild left basilar atelectasis is noted. No pleural effusion or pneumothorax is  seen. The cardiomediastinal silhouette is mildly enlarged. No acute osseous abnormalities are identified. IMPRESSION: 1. Right IJ line noted extending into the right atrium. This could be retracted approximately 5 cm to the cavoatrial junction, as deemed clinically appropriate. 2.  Lungs hypoexpanded, with mild left basilar atelectasis. 3. Mild cardiomegaly. These results were called by telephone at the time of interpretation on 01/22/2015 at 2:19 am to [facility] on [facility], who verbally acknowledged these results. Electronically Signed   By: Roanna Raider M.D.   On: 01/22/2015 02:19   Dg Abd Portable 1v  02/09/2015  CLINICAL DATA:  New onset nausea. EXAM: PORTABLE ABDOMEN - 1 VIEW COMPARISON:  01/27/2015 FINDINGS: There is a gastrostomy tube present. There is no bowel dilatation to suggest obstruction. There is no evidence of pneumoperitoneum, portal venous gas or pneumatosis. There are no pathologic calcifications along the expected course of the ureters. The osseous structures are unremarkable. IMPRESSION: Negative. Electronically Signed   By: Elige Ko   On: 02/09/2015 08:17   Dg Abd Portable 1v  01/27/2015  CLINICAL DATA:  Patient status post feeding tube placement. EXAM: PORTABLE ABDOMEN - 1 VIEW COMPARISON:  Abdominal radiograph 01/13/2015. FINDINGS: Feeding tube is present with tip projecting at the gastric antrum. Gas is demonstrated throughout the colon. No definite evidence for free intraperitoneal air. Cardiomegaly. Focal consolidative opacity within the left lung base. IMPRESSION: Feeding tube tip projects at the gastric antrum. Focal nodular consolidative opacity within the left lung base potentially secondary to an infectious process or atelectasis. Continued radiographic follow-up is recommended to ensure resolution. Electronically Signed   By: Annia Belt M.D.   On: 01/27/2015 11:39   US Abdomen Limited Ruq  02/11/2015  CLINICAL DATA:  Elevated liver enzymes. EXAM: US ABDOMEN LIMITED -  RIGHT UPPER QUADRANT COMPARISON:  None. FINDINGS: Gallbladder: No gallstones or wall thickening visualized. No sonographic Murphy sign noted by sonographer. Common bile duct: Diameter: 2.7 mm Liver: No focal lesion identified. Within normal limits in parenchymal echogenicity. IMPRESSION: Negative exam. Electronically Signed   ByMaisie Fus  Register   On: 02/11/2015 07:42     CBC  Recent Labs Lab 02/17/15 1518 02/18/15 0222 02/19/15 0130 02/19/15 0453 02/19/15 1307 02/20/15 0948 02/20/15 1053  WBC 15.9* 13.5* 12.2* 11.9*  --  20.1*  --   HGB 11.9* 9.0* 8.0* 8.0* 7.5* 11.1* 9.9*  HCT 37.4 28.6* 25.3* 25.2*  --  32.5* 29.3*  PLT 250 218 203 199  --  213  --   MCV 97.1 97.3 95.8 95.8  --  89.8  --   MCH 30.9 30.6 30.3 30.4  --  30.7  --   MCHC 31.8 31.5 31.6 31.7  --  34.2  --   RDW 16.4* 16.5* 16.0* 16.0*  --  15.9*  --   LYMPHSABS  --   --  2.2  --   --   --   --   MONOABS  --   --  1.0  --   --   --   --   EOSABS  --   --  0.1  --   --   --   --   BASOSABS  --   --  0.0  --   --   --   --     Chemistries   Recent Labs Lab 02/17/15 1518 02/18/15 0222 02/19/15 0453 02/20/15 0948  NA 142 144 141 144  K 4.4 3.7 3.6 5.0  CL 103 109 108 113*  CO2 25 25 22  20*  GLUCOSE 201* 126* 205* 133*  BUN 22* 19 13 6   CREATININE 0.84 0.67 0.47 0.55  CALCIUM 9.8 8.7* 8.6* 9.0  AST 27  --   --   --  ALT 57*  --   --   --   ALKPHOS 144*  --   --   --   BILITOT 0.1*  --   --   --    ------------------------------------------------------------------------------------------------------------------ estimated creatinine clearance is 81.2 mL/min (by C-G formula based on Cr of 0.55). ------------------------------------------------------------------------------------------------------------------ No results for input(s): HGBA1C in the last 72 hours. ------------------------------------------------------------------------------------------------------------------ No results for input(s): CHOL,  HDL, LDLCALC, TRIG, CHOLHDL, LDLDIRECT in the last 72 hours. ------------------------------------------------------------------------------------------------------------------ No results for input(s): TSH, T4TOTAL, T3FREE, THYROIDAB in the last 72 hours.  Invalid input(s): FREET3 ------------------------------------------------------------------------------------------------------------------ No results for input(s): VITAMINB12, FOLATE, FERRITIN, TIBC, IRON, RETICCTPCT in the last 72 hours.  Coagulation profile  Recent Labs Lab 02/17/15 1636  INR 1.21    No results for input(s): DDIMER in the last 72 hours.  Cardiac Enzymes No results for input(s): CKMB, TROPONINI, MYOGLOBIN in the last 168 hours.  Invalid input(s): CK ------------------------------------------------------------------------------------------------------------------ Invalid input(s): POCBNP     Time Spent in minutes   30 minutes   Blaise Grieshaber M.D on 02/20/2015 at 2:05 PM  Between 7am to 7pm - Pager - [phone]  After 7pm go to [email or web address] - password [facility]  [facility]   Office  [phone]

## 2015-02-20 NOTE — Progress Notes (Signed)
Pt tolerated procedure well. Vital signs are stable; 96 NSR, 99% on tracheostomy collar, RR 27, BP 123/76. Pt received 50 Fen and 4 Versed. Report given to ICU RN upon departure.

## 2015-02-20 NOTE — Progress Notes (Signed)
Patient vomited moderate amount. Patient did have a desaturation so oxygen was increased to 40%

## 2015-02-21 ENCOUNTER — Encounter (HOSPITAL_COMMUNITY): Payer: Self-pay | Admitting: Internal Medicine

## 2015-02-21 DIAGNOSIS — A4151 Sepsis due to Escherichia coli [E. coli]: Principal | ICD-10-CM

## 2015-02-21 DIAGNOSIS — I2690 Septic pulmonary embolism without acute cor pulmonale: Secondary | ICD-10-CM

## 2015-02-21 DIAGNOSIS — E0801 Diabetes mellitus due to underlying condition with hyperosmolarity with coma: Secondary | ICD-10-CM

## 2015-02-21 LAB — TYPE AND SCREEN
ABO/RH(D): O NEG
Antibody Screen: NEGATIVE
UNIT DIVISION: 0
Unit division: 0

## 2015-02-21 LAB — GLUCOSE, CAPILLARY
GLUCOSE-CAPILLARY: 178 mg/dL — AB (ref 65–99)
GLUCOSE-CAPILLARY: 123 mg/dL — AB (ref 65–99)
Glucose-Capillary: 121 mg/dL — ABNORMAL HIGH (ref 65–99)
Glucose-Capillary: 125 mg/dL — ABNORMAL HIGH (ref 65–99)
Glucose-Capillary: 140 mg/dL — ABNORMAL HIGH (ref 65–99)
Glucose-Capillary: 140 mg/dL — ABNORMAL HIGH (ref 65–99)
Glucose-Capillary: 143 mg/dL — ABNORMAL HIGH (ref 65–99)

## 2015-02-21 LAB — HEMOGLOBIN AND HEMATOCRIT, BLOOD
HCT: 30.9 % — ABNORMAL LOW (ref 36.0–46.0)
HEMOGLOBIN: 9.7 g/dL — AB (ref 12.0–15.0)

## 2015-02-21 LAB — CBC
HEMATOCRIT: 30 % — AB (ref 36.0–46.0)
HEMOGLOBIN: 9.9 g/dL — AB (ref 12.0–15.0)
MCH: 30.3 pg (ref 26.0–34.0)
MCHC: 33 g/dL (ref 30.0–36.0)
MCV: 91.7 fL (ref 78.0–100.0)
Platelets: 197 10*3/uL (ref 150–400)
RBC: 3.27 MIL/uL — ABNORMAL LOW (ref 3.87–5.11)
RDW: 16 % — ABNORMAL HIGH (ref 11.5–15.5)
WBC: 12.3 10*3/uL — ABNORMAL HIGH (ref 4.0–10.5)

## 2015-02-21 LAB — HEPARIN LEVEL (UNFRACTIONATED)
HEPARIN UNFRACTIONATED: 0.29 [IU]/mL — AB (ref 0.30–0.70)
HEPARIN UNFRACTIONATED: 0.36 [IU]/mL (ref 0.30–0.70)
Heparin Unfractionated: 0.51 IU/mL (ref 0.30–0.70)

## 2015-02-21 MED ORDER — AMLODIPINE BESYLATE 10 MG PO TABS
10.0000 mg | ORAL_TABLET | Freq: Every day | ORAL | Status: DC
  Administered 2015-02-21 – 2015-02-22 (×2): 10 mg
  Filled 2015-02-21 (×2): qty 1

## 2015-02-21 NOTE — Progress Notes (Addendum)
Patient Demographics  Rachel Vang, is a 50 y.o. female, DOB - 1965/02/16, ZOX:[id number]  Admit date - 02/17/2015   Admitting Physician Starleen Arms, MD  Outpatient Primary MD for the patient is No primary care provider on file.  LOS - 4   Chief Complaint  Patient presents with  . Shortness of Breath       Admission HPI/Brief narrative: 50 y.o. female, with past medical history of hypertension, recent lengthy hospitalization, secondary to intracranial hemorrhage, status post tracheostomy, PEG tube and chronic Foley, discharged on 02/14/2015 to SNF, presents with respiratory distress, CTA chest significant for extensive bilateral PE, as well sepsis secondary to UTI, cleared by neurosurgery to start on anticoagulation, she developed lower GI bleed(hematochezia) on heparin drip , required 2 units PRBC on 1/21 ,colonoscopy significant for internal hemorrhoid, resumed on anticoagulation, hemoglobin remained stable, no recurrence of GI bleed.  Subjective:   Rachel Vang is noncommunicative, can't provide any complaints , no significant events overnight Assessment & Plan    Principal Problem:   Sepsis (HCC) Active Problems:   ICH (intracerebral hemorrhage) (HCC)   Acute and chronic respiratory failure with hypoxia (HCC)   Diabetes mellitus (HCC)   Acute pulmonary embolism (HCC)   DVT (deep vein thrombosis) in pregnancy   Lower GI bleeding   Hemorrhoids with complication   Diverticulosis of colon without hemorrhage  Sepsis  - Presents with fever, tachypnea, tachycardia and leukocytosis  - Most likely related to UTI ,  - urine culture growing Escherichia coli and Enterobacter, both of them sensitive to Rocephin. - Blood culture growing Staphylococcus coag negative, most likely contaminant,  - On broad-spectrum antibiotics vancomycin and Zosyn, IV vancomycin stopped 1/21, transitioned  Zosyn to Rocephin 1/22  - Leukocytosis of 20,000, most likely reactive secondary to transfusion, is 12.3 this a.m.  Chronic respiratory failure/tracheostomy dependent - Oxygen demand at baseline, remains on aerosolized trach collar. - Continue with tracheostomy care, when necessary nebulizers.  Pulmonary embolism/left lower extremity DVT - CTA chest significant for extensive bilateral pulmonary embolism extending into lobar, segmental and subsegmental sinus branches throughout the lungs bilaterally. - Patient with recent intracranial hemorrhage, this was discussed with her primary neurosurgeon Dr. 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 - This was discussed with patient sister and son , explained risks versus benefits, and they are agreeable to plan. - Venous Doppler significant for left lower extremity DVT venous Doppler right - Resumed on heparin GTT status post colonoscopy, hemoglobin remained stable, no evidence of recurrent bleed . - Will transition to warfarin next 24 hours if remains stable and no recurrence of GI bleed.  Lower GI bleed - In the setting of heparin drip secondary to bilateral PE. - GI consult appreciated, colonoscopy 1/22 significant for diverticulosis and internal hemorrhoid, no evidence of active GI bleed. - Transfused 2 units PRBC on 1/21. - Monitor hemoglobin closely and transfuse as needed. -  patient with known history of hysterectomy, as well patient with hematochezia on digital rectal exam, pelvic ultrasound confirms hysterectomy history, unlikely Vaginal bleed .  Recent intracranial hemorrhage - Continue with supportive care. - Continue with tube feed - Remove staples from  surgical site  Diabetes mellitus - Continue with insulin sliding scale every 4 hours  Hypertension -  started to increase, will resume on home medication including  metoprolol, amlodipine , continue to hold clonidine .  Code Status: Full  Family Communication: spoke with Aunt at bedside.  Disposition Plan:back to SNF when stable.  Overall guarded prognosis in this female with extensive bilateral PE, evidence of GI bleed with anticoagulation, as well vegetative  State, with high chance of recurrence of her UTI, aspiration, pneumonia, pressure ulcer.   Procedures  Colonoscopy 1/22 by Dr. Leone Payor 2. PRBC transfusion 1/21 Consults   GI   Medications  Scheduled Meds: . bethanechol  10 mg Oral 3 times per day  . cefTRIAXone (ROCEPHIN)  IV  1 g Intravenous Q24H  . feeding supplement (PRO-STAT SUGAR FREE 64)  30 mL Per Tube BID  . fentaNYL  25 mcg Transdermal Q72H  . hydrocortisone   Rectal BID  . insulin aspart  0-9 Units Subcutaneous 6 times per day  . levETIRAcetam  500 mg Per Tube BID  . metoprolol tartrate  125 mg Oral BID  . sodium chloride  3 mL Intravenous Q12H   Continuous Infusions: . sodium chloride 50 mL/hr at 02/21/15 0941  . feeding supplement (JEVITY 1.2 CAL) 50 mL/hr at 02/20/15 1521  . heparin 1,450 Units/hr (02/21/15 0941)   PRN Meds:.albuterol  DVT Prophylaxis SCD right lower extremity  Lab Results  Component Value Date   PLT 197 02/21/2015    Antibiotics    Anti-infectives    Start     Dose/Rate Route Frequency Ordered Stop   02/20/15 1500  cefTRIAXone (ROCEPHIN) 1 g in dextrose 5 % 50 mL IVPB     1 g 100 mL/hr over 30 Minutes Intravenous Every 24 hours 02/20/15 1417     02/18/15 0400  vancomycin (VANCOCIN) IVPB 1000 mg/200 mL premix  Status:  Discontinued     1,000 mg 200 mL/hr over 60 Minutes Intravenous Every 12 hours 02/17/15 2108 02/19/15 1120   02/17/15 2200  piperacillin-tazobactam (ZOSYN) IVPB 3.375 g  Status:  Discontinued     3.375 g 12.5 mL/hr over 240 Minutes Intravenous 3 times per day 02/17/15 2108 02/20/15 1417   02/17/15 1530  vancomycin (VANCOCIN) 1,500 mg in sodium chloride 0.9 % 500 mL IVPB      1,500 mg 250 mL/hr over 120 Minutes Intravenous  Once 02/17/15 1518 02/17/15 1803   02/17/15 1530  piperacillin-tazobactam (ZOSYN) IVPB 3.375 g     3.375 g 100 mL/hr over 30 Minutes Intravenous  Once 02/17/15 1518 02/17/15 1600          Objective:   Filed Vitals:   02/21/15 0900 02/21/15 0941 02/21/15 1000 02/21/15 1100  BP: 153/95  140/93 154/100  Pulse: 101 99 93 87  Temp:      TempSrc:      Resp: 23  24 27   Height:      Weight:      SpO2: 100%  98% 100%    Wt Readings from Last 3 Encounters:  02/17/15 69.1 kg (152 lb 5.4 oz)  02/14/15 72.2 kg (159 lb 2.8 oz)     Intake/Output Summary (Last 24 hours) at 02/21/15 1142 Last data filed at 02/21/15 0900  Gross per 24 hour  Intake 2231.64 ml  Output   3151 ml  Net -919.36 ml     Physical Exam Laying in bed in no apparent distress, appears comfortable  Supple Neck,No JVD,tracheostomy +, site looks  clean  Symmetrical Chest wall movement, Good air movement bilaterally, no wheezing. RRR,No Gallops,Rubs or new Murmurs, No Parasternal Heave +ve B.Sounds, Abd Soft, No tenderness, PEG +,No rebound - guarding or rigidity.  No Cyanosis, Clubbing or edema, No new Rash or bruise     Data Review   Micro Results Recent Results (from the past 240 hour(s))  Blood culture (routine x 2)     Status: None   Collection Time: 02/17/15  3:18 PM  Result Value Ref Range Status   Specimen Description BLOOD RIGHT HAND  Final   Special Requests BOTTLES DRAWN AEROBIC AND ANAEROBIC 5CC  Final   Culture  Setup Time   Final    GRAM POSITIVE COCCI IN CLUSTERS AEROBIC BOTTLE ONLY CRITICAL RESULT CALLED TO, READ BACK BY AND VERIFIED WITH: Foye Deer RN 2013 02/18/15 A BROWNING    Culture   Final    STAPHYLOCOCCUS SPECIES (COAGULASE NEGATIVE) THE SIGNIFICANCE OF ISOLATING THIS ORGANISM FROM A SINGLE SET OF BLOOD CULTURES WHEN MULTIPLE SETS ARE DRAWN IS UNCERTAIN. PLEASE NOTIFY THE MICROBIOLOGY DEPARTMENT WITHIN ONE WEEK IF SPECIATION AND  SENSITIVITIES ARE REQUIRED.    Report Status 02/20/2015 FINAL  Final  Blood culture (routine x 2)     Status: None (Preliminary result)   Collection Time: 02/17/15  3:20 PM  Result Value Ref Range Status   Specimen Description BLOOD BLOOD LEFT FOREARM  Final   Special Requests   Final    BOTTLES DRAWN AEROBIC AND ANAEROBIC 5CC AER 4CC ANA   Culture NO GROWTH 3 DAYS  Final   Report Status PENDING  Incomplete  Urine culture     Status: None   Collection Time: 02/17/15  4:07 PM  Result Value Ref Range Status   Specimen Description URINE, CATHETERIZED  Final   Special Requests NONE  Final   Culture   Final    >=100,000 COLONIES/mL ESCHERICHIA COLI >=100,000 COLONIES/mL ENTEROBACTER CLOACAE    Report Status 02/20/2015 FINAL  Final   Organism ID, Bacteria ESCHERICHIA COLI  Final   Organism ID, Bacteria ENTEROBACTER CLOACAE  Final      Susceptibility   Enterobacter cloacae - MIC*    CEFAZOLIN >=64 RESISTANT Resistant     CEFTRIAXONE <=1 SENSITIVE Sensitive     CIPROFLOXACIN <=0.25 SENSITIVE Sensitive     GENTAMICIN <=1 SENSITIVE Sensitive     IMIPENEM 0.5 SENSITIVE Sensitive     NITROFURANTOIN 64 INTERMEDIATE Intermediate     TRIMETH/SULFA <=20 SENSITIVE Sensitive     PIP/TAZO 8 SENSITIVE Sensitive     * >=100,000 COLONIES/mL ENTEROBACTER CLOACAE   Escherichia coli - MIC*    AMPICILLIN 8 SENSITIVE Sensitive     CEFAZOLIN <=4 SENSITIVE Sensitive     CEFTRIAXONE <=1 SENSITIVE Sensitive     CIPROFLOXACIN <=0.25 SENSITIVE Sensitive     GENTAMICIN <=1 SENSITIVE Sensitive     IMIPENEM <=0.25 SENSITIVE Sensitive     NITROFURANTOIN <=16 SENSITIVE Sensitive     TRIMETH/SULFA <=20 SENSITIVE Sensitive     AMPICILLIN/SULBACTAM 4 SENSITIVE Sensitive     PIP/TAZO <=4 SENSITIVE Sensitive     * >=100,000 COLONIES/mL ESCHERICHIA COLI  MRSA PCR Screening     Status: None   Collection Time: 02/17/15  8:13 PM  Result Value Ref Range Status   MRSA by PCR NEGATIVE NEGATIVE Final    Comment:         The GeneXpert MRSA Assay (FDA approved for NASAL specimens only), is one component of a comprehensive MRSA colonization surveillance program.  It is not intended to diagnose MRSA infection nor to guide or monitor treatment for MRSA infections.   C difficile quick scan w PCR reflex     Status: None   Collection Time: 02/18/15  8:34 PM  Result Value Ref Range Status   C Diff antigen NEGATIVE NEGATIVE Final   C Diff toxin NEGATIVE NEGATIVE Final   C Diff interpretation Negative for toxigenic C. difficile  Final    Radiology Reports Ct Head Wo Contrast  02/04/2015  CLINICAL DATA:  Followup scan.  EVD clamping for 48 hours. EXAM: CT HEAD WITHOUT CONTRAST TECHNIQUE: Contiguous axial images were obtained from the base of the skull through the vertex without intravenous contrast. COMPARISON:  02/02/2015 FINDINGS: Ventriculostomy catheter position is stable, tip near the foramina Bassett. Persistent frothy secretions and mucosal thickening in the sphenoid sinuses in this patient with previous nasal intubation. No new calvarial or orbital findings. 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 There is mild lateral ventriculomegaly, best seen at the temporal horns, which is stable. No third or fourth ventricular enlargement. Scattered subarachnoid hemorrhage which is much improved compared to admission and stable from most recent scan. An ovoid hematoma along the anterior interhemispheric fissure is stable in size and density is fading. Stable low-density in the bilateral inferior frontal lobes. No evidence of acute infarct. IMPRESSION: 1. Stable mild lateral ventriculomegaly. 2. Stable subarachnoid hemorrhage compared to 2 days ago. 3. Sphenoid sinusitis Electronically Signed   By: Marnee Spring M.D.   On: 02/04/2015 15:47   Ct Head Wo Contrast  02/02/2015  CLINICAL  DATA:  Cerebral edema.  Aneurysm coiling. EXAM: CT HEAD WITHOUT CONTRAST TECHNIQUE: Contiguous axial images were obtained from the base of the skull through the vertex without intravenous contrast. 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 Coil remains in good position. Mild diffuse high-density subarachnoid hemorrhage is unchanged. No new hemorrhage. Right frontal ventricular catheter tip at the foramina [place] unchanged. Ventricles are slightly larger compared with the prior study. Negative for acute infarct.  No shift of the midline structures. Air-fluid level in the sphenoid sinus bilaterally similar to the prior study. 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 No new hemorrhage. Ventricular catheter remains in good position. Mild ventricular dilatation compared with the recent CT. Electronically Signed   By: Marlan Palau M.D.   On: 02/02/2015 12:29   Ct Head Wo Contrast  01/28/2015  CLINICAL DATA:  Followup acute subarachnoid hemorrhage and coiling of anterior cerebral artery aneurysm EXAM: CT HEAD WITHOUT CONTRAST TECHNIQUE: Contiguous axial images were obtained from the base of the skull through the vertex without intravenous contrast. COMPARISON:  01/13/2015 FINDINGS: Ventriculostomy from a right frontal approach enters the frontal horn of the right lateral ventricle in has its tip in the region of the foramen of Monro. Ventricles are mildly larger than were seen on the presenting scan. Coils are present at the location of the anterior communicating artery aneurysm. Anterior interhemispheric blood collide is becoming smaller and less dense. Diffuse subarachnoid hemorrhage is becoming less dense. No definable hyper acute hemorrhage. No convexity subdural or effusion. Layering fluid is present in the sphenoid sinus. No CT evidence of ischemic infarction.  IMPRESSION: Previous coiling of anterior communicating artery aneurysm. Right frontal ventriculostomy. Ventricular size is slightly larger than on the presenting examination. Diminishing density of diffuse  subarachnoid blood and and interhemispheric hematoma anteriorly. Electronically Signed   By: Paulina Fusi M.D.   On: 01/28/2015 07:30   Ct Angio Chest Pe W/cm &/or Wo Cm  02/17/2015  CLINICAL DATA:  50 year old female with shallow respirations and shortness of breath. Low oxygen saturations. EXAM: CT ANGIOGRAPHY CHEST WITH CONTRAST TECHNIQUE: Multidetector CT imaging of the chest was performed using the standard protocol during bolus administration of intravenous contrast. Multiplanar CT image reconstructions and MIPs were obtained to evaluate the vascular anatomy. CONTRAST:  80mL OMNIPAQUE IOHEXOL 350 MG/ML SOLN COMPARISON:  No priors. FINDINGS: Mediastinum/Lymph Nodes: There are filling defects in the distal main pulmonary arteries bilaterally extending into lobar, segmental and subsegmental sized branches throughout most of the lungs. No saddle embolus is identified. Pulmonic trunk measures 2.8 cm in diameter. Right ventricle measures up to 3.2 cm in diameter. Left ventricle measures up to 4.1 cm in diameter. Mild cardiomegaly. Left ventricular concentric hypertrophy. There is no significant pericardial fluid, thickening or pericardial calcification. There is atherosclerosis of the thoracic aorta, the great vessels of the mediastinum and the coronary arteries, including calcified atherosclerotic plaque in the left anterior descending, left circumflex and right coronary arteries. No pathologically enlarged mediastinal or hilar lymph nodes. Esophagus is unremarkable in appearance. No axillary lymphadenopathy. Tracheostomy tube in position with tip terminating approximately 4.6 cm above the carina. Lungs/Pleura: Areas of dependent atelectasis are noted in lower lobes of the lungs bilaterally. No acute  consolidative airspace disease. No pleural effusions. No definite suspicious appearing pulmonary nodules or masses. Upper Abdomen: Unremarkable. Musculoskeletal/Soft Tissues: There are no aggressive appearing lytic or blastic lesions noted in the visualized portions of the skeleton. Review of the MIP images confirms the above findings. IMPRESSION: 1. Extensive pulmonary emboli involving distal main pulmonary arteries extending into lobar, segmental and subsegmental sized branches throughout the lungs bilaterally. At this time, there are no overt findings to suggest right heart strain. 2. Cardiomegaly with left ventricular concentric hypertrophy. 3. Dependent atelectasis throughout the lower lobes of the lungs bilaterally. Critical Value/emergent results were called by telephone at the time of interpretation on 02/17/2015 at 7:40 pm to Dr. Silas Flood, who verbally acknowledged these results. Electronically Signed   By: Trudie Reed M.D.   On: 02/17/2015 19:42   US Pelvis Complete  02/19/2015  CLINICAL DATA:  Bleeding. Unsure of bleeding is vaginal or rectal. Verify history of hysterectomy. EXAM: TRANSABDOMINAL ULTRASOUND OF PELVIS TECHNIQUE: Transabdominal ultrasound examination of the pelvis was performed including evaluation of the uterus, ovaries, adnexal regions, and pelvic cul-de-sac. COMPARISON:  None. FINDINGS: Uterus Not seen, presumed hysterectomy. No mass or fluid identified within the midline pelvis. Right ovary Not seen. No mass or free fluid identified in the right adnexal region. Fairly prominent fluid-filled bowel loops are seen within the right pelvis. Left ovary Not seen. No mass or free fluid identified in the left adnexal region. Fairly prominent fluid-filled bowel loops are also appreciated in the left hemipelvis. Other findings:  No free fluid seen. IMPRESSION: 1. Uterus not identified, presumed hysterectomy. No mass or free fluid seen within the midline pelvis. 2. Neither ovary is seen,  perhaps bilateral oophorectomies, perhaps obscured by the fairly prominent fluid-filled bowel loops in the pelvis. No mass or free fluid seen within either adnexal region. Electronically Signed   By: Bary Richard M.D.   On: 02/19/2015 16:25   Dg Chest Portable 1 View  02/20/2015  CLINICAL DATA:  50 year old female with possible aspiration. EXAM: PORTABLE CHEST 1 VIEW COMPARISON:  Radiograph dated 02/17/2015 and chest CT dated 02/17/2015 for go FINDINGS: Tracheostomy with tip above the carina. Single portable view of the chest does not demonstrate a focal consolidation. There is no pleural effusion or pneumothorax. Mild cardiomegaly. No acute osseous pathology. IMPRESSION: No focal consolidation. Electronically Signed   By: Elgie Collard M.D.   On: 02/20/2015 06:04   Dg Chest Portable 1 View  02/17/2015  CLINICAL DATA:  Shortness of breath.  Trauma.  Unresponsive patient. EXAM: PORTABLE CHEST 1 VIEW COMPARISON:  02/06/2015 FINDINGS: Tracheostomy tube projects along the tracheal air column. Low lung volumes are present, causing crowding of the pulmonary vasculature. Mild enlargement of the cardiopericardial silhouette, without edema. No discrete airspace opacity identified. No pneumothorax. IMPRESSION: 1. Low lung volumes but the lungs appear clear. Tracheostomy tube noted. 2. Mild enlargement of the cardiopericardial silhouette. Electronically Signed   By: Gaylyn Rong M.D.   On: 02/17/2015 15:40   Dg Chest Port 1 View  02/06/2015  CLINICAL DATA:  Patient with history of fever. EXAM: PORTABLE CHEST 1 VIEW COMPARISON:  Chest radiograph 02/05/2015. FINDINGS: Tracheostomy tube terminates the mid trachea. Multiple monitoring leads overlie the patient. Stable enlarged cardiac and mediastinal contours. Elevation of the right hemidiaphragm. Unchanged heterogeneous opacities left lung base. No pleural effusion or pneumothorax. IMPRESSION: Unchanged retrocardiac opacity which may represent atelectasis or  infection. Electronically Signed   By: Annia Belt M.D.   On: 02/06/2015 08:51   Dg Chest Port 1 View  02/05/2015  CLINICAL DATA:  50 year old female with fever EXAM: PORTABLE CHEST 1 VIEW COMPARISON:  Prior chest x-ray obtained earlier today at 6 a.m. FINDINGS: Tracheostomy tube remains in stable position. Unchanged cardiomegaly. Unchanged bilateral retrocardiac airspace opacities with a linear configuration slightly more prominent on the left than the right remains unchanged. No acute osseous abnormality. IMPRESSION: No interval change in the appearance of the chest compared to earlier this morning. Persistent left greater than right basilar atelectasis. Electronically Signed   By: Malachy Moan M.D.   On: 02/05/2015 08:55   Dg Chest Port 1 View  02/05/2015  CLINICAL DATA:  Respiratory failure EXAM: PORTABLE CHEST 1 VIEW COMPARISON:  02/01/2015 chest radiograph. FINDINGS: Tracheostomy tube tip overlies the tracheal air column 3.5 cm above the carina. Stable cardiomediastinal silhouette with mild cardiomegaly. No pneumothorax. No pleural effusion. Stable mild platelike atelectasis at the left lung base. No pulmonary edema. IMPRESSION: Well-positioned tracheostomy tube. Stable mild cardiomegaly without overt pulmonary edema. Stable mild platelike atelectasis at the left lung base. Electronically Signed   By: Delbert Phenix M.D.   On: 02/05/2015 07:42   Dg Chest Port 1 View  02/01/2015  CLINICAL DATA:  Respiratory failure. EXAM: PORTABLE CHEST 1 VIEW COMPARISON:  January 28, 2015. FINDINGS: Stable cardiomediastinal silhouette. No pneumothorax or pleural effusion is noted. Tracheostomy tube is unchanged in position. Distal tip of feeding tube is seen in expected position of distal stomach. Stable position of right internal jugular catheter line with distal tip in expected position of upper right atrium. Both lungs are clear. The visualized skeletal structures are unremarkable. IMPRESSION: Stable support  apparatus. No acute cardiopulmonary abnormality seen. Electronically Signed   By: Lupita Raider, M.D.   On: 02/01/2015 12:21   Dg Chest Port 1 View  01/28/2015  CLINICAL DATA:  Respiratory failure. EXAM: PORTABLE CHEST 1 VIEW COMPARISON:  01/27/2015 FINDINGS: Again noted is a tracheostomy tube. Jugular central line tip is in the upper right atrium. Feeding tube extends into the abdomen. There is  mild peribronchial thickening with increased densities at the lung bases. Slightly low lung volumes. Heart size is stable. IMPRESSION: Low lung volumes with mild peribronchial thickening. Cannot exclude mild interstitial edema. Support apparatuses as described. Electronically Signed   By: Richarda Overlie M.D.   On: 01/28/2015 08:04   Dg Chest Port 1 View  01/27/2015  CLINICAL DATA:  New tracheostomy EXAM: PORTABLE CHEST 1 VIEW COMPARISON:  Chest radiograph from one day prior. FINDINGS: Tracheostomy tube tip overlies the tracheal air column just below the thoracic inlet. Enteric tube enters the stomach with the tip not seen on this image. Right internal jugular central venous catheter terminates at the cavoatrial junction. Stable cardiomediastinal silhouette with normal heart size. No pneumothorax. No pleural effusion. Stable mild left basilar atelectasis. No pulmonary edema. No new lung opacity. IMPRESSION: 1. Well-positioned tracheostomy tube. 2. Mild left basilar atelectasis. Otherwise no active cardiopulmonary disease. Electronically Signed   By: Delbert Phenix M.D.   On: 01/27/2015 11:33   Dg Chest Port 1 View  01/26/2015  CLINICAL DATA:  Respiratory failure. EXAM: PORTABLE CHEST 1 VIEW COMPARISON:  01/24/2015 FINDINGS: Endotracheal tube is 1.9 cm above the carina. Central line tip in the upper right atrium. Small nodular density in the right upper chest probably related to the ECG lead. Streaky densities in the left lower lung are suggestive for atelectasis. Heart size is normal. Negative for a pneumothorax.  Nasogastric tube extends into the abdomen. IMPRESSION: Few densities at the left lung base are suggestive for atelectasis. Otherwise, no focal airspace disease. Support apparatuses as described. Subtle nodular density in the right upper chest is probably related to the overlying support apparatus as described. Recommend attention to this area on follow up imaging. Electronically Signed   By: Richarda Overlie M.D.   On: 01/26/2015 07:42   Dg Chest Port 1 View  01/24/2015  CLINICAL DATA:  Endotracheal tube placement. EXAM: PORTABLE CHEST 1 VIEW COMPARISON:  01/23/2015 and 01/22/2015. FINDINGS: 0555 hours. The endotracheal tube has been advanced and is now 1 cm above the carina. Right IJ central venous catheter projects to the mid right atrial level. Nasogastric tube projects below the diaphragm, tip not visualized. The heart size and mediastinal contours are stable. There is stable mild atelectasis at both lung bases. No pneumothorax or significant pleural effusion identified. Multiple lines overlie the chest. IMPRESSION: The endotracheal tube is low with its tip within 1 cm of carina. For more optimal positioning, this could be withdrawn 3-4 cm. No other significant changes. Electronically Signed   By: Carey Bullocks M.D.   On: 01/24/2015 08:07   Dg Chest Port 1 View  01/23/2015  CLINICAL DATA:  Evaluate ET tube placement EXAM: PORTABLE CHEST 1 VIEW COMPARISON:  01/22/2015 FINDINGS: The endotracheal tube tip is above the carina. There is a right IJ catheter with tip in the right atrium. Nasogastric tube tip is below the field of view. Normal heart size. Mild pulmonary edema is identified, increased from previous exam. IMPRESSION: 1. Stable support apparatus. 2. Mild pulmonary edema. Electronically Signed   By: Signa Kell M.D.   On: 01/23/2015 09:55   Dg Abd Portable 1v  02/09/2015  CLINICAL DATA:  New onset nausea. EXAM: PORTABLE ABDOMEN - 1 VIEW COMPARISON:  01/27/2015 FINDINGS: There is a gastrostomy tube  present. There is no bowel dilatation to suggest obstruction. There is no evidence of pneumoperitoneum, portal venous gas or pneumatosis. There are no pathologic calcifications along the expected course of the ureters. The osseous  structures are unremarkable. IMPRESSION: Negative. Electronically Signed   By: Elige Ko   On: 02/09/2015 08:17   Dg Abd Portable 1v  01/27/2015  CLINICAL DATA:  Patient status post feeding tube placement. EXAM: PORTABLE ABDOMEN - 1 VIEW COMPARISON:  Abdominal radiograph 01/13/2015. FINDINGS: Feeding tube is present with tip projecting at the gastric antrum. Gas is demonstrated throughout the colon. No definite evidence for free intraperitoneal air. Cardiomegaly. Focal consolidative opacity within the left lung base. IMPRESSION: Feeding tube tip projects at the gastric antrum. Focal nodular consolidative opacity within the left lung base potentially secondary to an infectious process or atelectasis. Continued radiographic follow-up is recommended to ensure resolution. Electronically Signed   By: Annia Belt M.D.   On: 01/27/2015 11:39   US Abdomen Limited Ruq  02/11/2015  CLINICAL DATA:  Elevated liver enzymes. EXAM: US ABDOMEN LIMITED - RIGHT UPPER QUADRANT COMPARISON:  None. FINDINGS: Gallbladder: No gallstones or wall thickening visualized. No sonographic Murphy sign noted by sonographer. Common bile duct: Diameter: 2.7 mm Liver: No focal lesion identified. Within normal limits in parenchymal echogenicity. IMPRESSION: Negative exam. Electronically Signed   ByMaisie Fus  Register   On: 02/11/2015 07:42     CBC  Recent Labs Lab 02/18/15 0222 02/19/15 0130 02/19/15 0453 02/19/15 1307 02/20/15 0948 02/20/15 1053 02/20/15 1833 02/21/15 0610  WBC 13.5* 12.2* 11.9*  --  20.1*  --   --  12.3*  HGB 9.0* 8.0* 8.0* 7.5* 11.1* 9.9* 9.5* 9.9*  HCT 28.6* 25.3* 25.2*  --  32.5* 29.3* 28.9* 30.0*  PLT 218 203 199  --  213  --   --  197  MCV 97.3 95.8 95.8  --  89.8  --   --   91.7  MCH 30.6 30.3 30.4  --  30.7  --   --  30.3  MCHC 31.5 31.6 31.7  --  34.2  --   --  33.0  RDW 16.5* 16.0* 16.0*  --  15.9*  --   --  16.0*  LYMPHSABS  --  2.2  --   --   --   --   --   --   MONOABS  --  1.0  --   --   --   --   --   --   EOSABS  --  0.1  --   --   --   --   --   --   BASOSABS  --  0.0  --   --   --   --   --   --     Chemistries   Recent Labs Lab 02/17/15 1518 02/18/15 0222 02/19/15 0453 02/20/15 0948  NA 142 144 141 144  K 4.4 3.7 3.6 5.0  CL 103 109 108 113*  CO2 25 25 22  20*  GLUCOSE 201* 126* 205* 133*  BUN 22* 19 13 6   CREATININE 0.84 0.67 0.47 0.55  CALCIUM 9.8 8.7* 8.6* 9.0  AST 27  --   --   --   ALT 57*  --   --   --   ALKPHOS 144*  --   --   --   BILITOT 0.1*  --   --   --    ------------------------------------------------------------------------------------------------------------------ estimated creatinine clearance is 81.2 mL/min (by C-G formula based on Cr of 0.55). ------------------------------------------------------------------------------------------------------------------ No results for input(s): HGBA1C in the last 72 hours. ------------------------------------------------------------------------------------------------------------------ No results for input(s): CHOL, HDL, LDLCALC, TRIG, CHOLHDL, LDLDIRECT in the last 72 hours. ------------------------------------------------------------------------------------------------------------------  No results for input(s): TSH, T4TOTAL, T3FREE, THYROIDAB in the last 72 hours.  Invalid input(s): FREET3 ------------------------------------------------------------------------------------------------------------------ No results for input(s): VITAMINB12, FOLATE, FERRITIN, TIBC, IRON, RETICCTPCT in the last 72 hours.  Coagulation profile  Recent Labs Lab 02/17/15 1636  INR 1.21    No results for input(s): DDIMER in the last 72 hours.  Cardiac Enzymes No results for input(s):  CKMB, TROPONINI, MYOGLOBIN in the last 168 hours.  Invalid input(s): CK ------------------------------------------------------------------------------------------------------------------ Invalid input(s): POCBNP     Time Spent in minutes   25 minutes   Octavia Mottola M.D on 02/21/2015 at 11:42 AM  Between 7am to 7pm - Pager - [phone]  After 7pm go to [email or web address] - password [facility]  [facility]   Office  [phone]

## 2015-02-21 NOTE — Progress Notes (Signed)
ANTICOAGULATION CONSULT NOTE - Follow Up Consult  Pharmacy Consult for Heparin  Indication: pulmonary embolus  No Known Allergies  Patient Measurements: Height:  (165.1 cm) Weight: 152 lb 5.4 oz (69.1 kg) IBW/kg (Calculated) : 57  Vital Signs: Temp: 98.1 F (36.7 C) (01/23 0330) Temp Source: Axillary (01/23 0330) BP: 168/89 mmHg (01/23 0330) Pulse Rate: 99 (01/23 0500)  Labs:  Recent Labs  02/19/15 0130 02/19/15 0453  02/20/15 0948 02/20/15 1053 02/20/15 1833 02/20/15 2036 02/21/15 0610 02/21/15 0611  HGB 8.0* 8.0*  < > 11.1* 9.9* 9.5*  --  9.9*  --   HCT 25.3* 25.2*  --  32.5* 29.3* 28.9*  --  30.0*  --   PLT 203 199  --  213  --   --   --  197  --   HEPARINUNFRC 0.16*  --   --   --   --   --  <0.10*  --  0.29*  CREATININE  --  0.47  --  0.55  --   --   --   --   --   < > = values in this interval not displayed.  Estimated Creatinine Clearance: 81.2 mL/min (by C-G formula based on Cr of 0.55).   Assessment: Heparin drip is sub-therapeutic despite rate increase, hemorrhoidal bleeding stopped, no issues per RN>   Goal of Therapy:  Heparin level 0.3-0.7 units/ml Monitor platelets by anticoagulation protocol: Yes   Plan:  -Increase heparin slightly to 1450 units/hr -1300 HL -Continue to monitor for bleeding  Abran Duke 02/21/2015,6:33 AM

## 2015-02-21 NOTE — NC FL2 (Signed)
[place] MEDICAID FL2 LEVEL OF CARE SCREENING TOOL     IDENTIFICATION  Patient Name: Rachel Vang Birthdate: 03-06-1965 Sex: female Admission Date (Current Location): 02/17/2015  [facility] and [place] Number:  Producer, television/film/video and Address:  [facility], [address], [place], [place] [address]      Provider Number: [id number]  Attending Physician Name and Address:  Starleen Arms, MD  Relative Name and Phone Number:  [name], son, [phone]    Current Level of Care: Hospital Recommended Level of Care: Skilled Nursing Facility Prior Approval Number:    Date Approved/Denied:   PASRR Number: [id number]  Discharge Plan: SNF    Current Diagnoses: Patient Active Problem List   Diagnosis Date Noted  . Hemorrhoids with complication   . Diverticulosis of colon without hemorrhage   . Acute pulmonary embolism (HCC) 02/19/2015  . DVT (deep vein thrombosis) in pregnancy 02/19/2015  . Lower GI bleeding 02/19/2015  . Dysphagia 02/18/2015  . Enteritis due to Clostridium difficile 02/18/2015  . HTN (hypertension) 02/18/2015  . Hemophilus influenzae (H. influenzae) as the cause of diseases classified elsewhere 02/18/2015  . Tracheobronchitis 02/18/2015  . E. coli UTI 02/18/2015  . Seizures (HCC) 02/18/2015  . Elevated troponin 02/18/2015  . Urinary retention 02/18/2015  . Sepsis (HCC) 02/17/2015  . Diabetes mellitus (HCC) 02/17/2015  . History of ETT   . Hydrocephalus   . Tracheostomy in place [facility])   . Cerebral edema (HCC)   . Acute and chronic respiratory failure with hypoxia (HCC)   . Central line complication   . Encounter for central line care   . Encounter for feeding tube placement   . Neurological abnormality   . Subarachnoid hemorrhage (HCC)   . Acute respiratory failure with hypoxia and hypercapnia (HCC)   . Encounter for orogastric (OG) tube placement   . SAH (subarachnoid hemorrhage) (HCC)   . ICH (intracerebral  hemorrhage) (HCC) 01/13/2015    Orientation RESPIRATION BLADDER Height & Weight     (Unable to follow commands; traech;)  Tracheostomy Indwelling catheter, Incontinent (Urinary catheter)  (165.1 cm) 152 lbs.  BEHAVIORAL SYMPTOMS/MOOD NEUROLOGICAL BOWEL NUTRITION STATUS   (N/A)   Continent (Gastrostomy)  (Please see DC summary)  AMBULATORY STATUS COMMUNICATION OF NEEDS Skin   Extensive Assist Non-Verbally Surgical wounds                       Personal Care Assistance Level of Assistance  Bathing, Feeding, Dressing Bathing Assistance: Maximum assistance Feeding assistance: Maximum assistance Dressing Assistance: Maximum assistance     Functional Limitations Info  Speech     Speech Info: Impaired (Intubated)    SPECIAL CARE FACTORS FREQUENCY  PT (By licensed PT)     PT Frequency: 3 OT Frequency: 2            Contractures      Additional Factors Info  Code Status, Allergies, Insulin Sliding Scale Code Status Info: Full Allergies Info: NKA   Insulin Sliding Scale Info: insulin aspart (novoLOG) injection 0-9 Units 6 times per day       Current Medications (02/21/2015):  This is the current hospital active medication list Current Facility-Administered Medications  Medication Dose Route Frequency Provider Last Rate Last Dose  . 0.9 %  sodium chloride infusion   Intravenous Continuous Starleen Arms, MD 50 mL/hr at 02/21/15 1100    . albuterol (PROVENTIL) (2.5 MG/3ML) 0.083% nebulizer solution 2.5 mg  2.5 mg  Nebulization Q2H PRN Starleen Arms, MD      . amLODipine (NORVASC) tablet 10 mg  10 mg Per Tube Daily Starleen Arms, MD      . bethanechol (URECHOLINE) tablet 10 mg  10 mg Oral 3 times per day Starleen Arms, MD   10 mg at 02/21/15 0553  . cefTRIAXone (ROCEPHIN) 1 g in dextrose 5 % 50 mL IVPB  1 g Intravenous Q24H Starleen Arms, MD   1 g at 02/20/15 1449  . feeding supplement (JEVITY 1.2 CAL) liquid 1,000 mL  1,000 mL Per Tube  Continuous Starleen Arms, MD 50 mL/hr at 02/21/15 1100 1,000 mL at 02/21/15 1100  . feeding supplement (PRO-STAT SUGAR FREE 64) liquid 30 mL  30 mL Per Tube BID Starleen Arms, MD   30 mL at 02/21/15 0942  . fentaNYL (DURAGESIC - dosed mcg/hr) patch 25 mcg  25 mcg Transdermal Q72H Leana Roe Elgergawy, MD   25 mcg at 02/20/15 2007  . heparin ADULT infusion 100 units/mL (25000 units/250 mL)  1,450 Units/hr Intravenous Continuous Stevphen Rochester, RPH 14.5 mL/hr at 02/21/15 1100 1,450 Units/hr at 02/21/15 1100  . hydrocortisone (ANUSOL-HC) 2.5 % rectal cream   Rectal BID Iva Boop, MD      . insulin aspart (novoLOG) injection 0-9 Units  0-9 Units Subcutaneous 6 times per day Starleen Arms, MD   2 Units at 02/21/15 [phone]  . levETIRAcetam (KEPPRA) 100 MG/ML solution 500 mg  500 mg Per Tube BID Starleen Arms, MD   500 mg at 02/21/15 0957  . metoprolol tartrate (LOPRESSOR) tablet 125 mg  125 mg Oral BID Starleen Arms, MD   125 mg at 02/21/15 0941  . sodium chloride 0.9 % injection 3 mL  3 mL Intravenous Q12H Starleen Arms, MD   3 mL at 02/21/15 1478     Discharge Medications: Please see discharge summary for a list of discharge medications.  Relevant Imaging Results:  Relevant Lab Results:   Additional Information SSN: [id number] [place], [place]

## 2015-02-21 NOTE — Progress Notes (Signed)
ANTICOAGULATION CONSULT NOTE - Follow Up Consult  Pharmacy Consult for Heparin  Indication: pulmonary embolus  No Known Allergies  Patient Measurements: Height:  (165.1 cm) Weight: 152 lb 5.4 oz (69.1 kg) IBW/kg (Calculated) : 57  Vital Signs: Temp: 98.8 F (37.1 C) (01/23 2000) Temp Source: Oral (01/23 2000) BP: 152/95 mmHg (01/23 2000) Pulse Rate: 98 (01/23 2000)  Labs:  Recent Labs  02/19/15 0453  02/20/15 0948  02/20/15 1833  02/21/15 0610 02/21/15 0611 02/21/15 1400 02/21/15 2030  HGB 8.0*  < > 11.1*  < > 9.5*  --  9.9*  --   --  9.7*  HCT 25.2*  --  32.5*  < > 28.9*  --  30.0*  --   --  30.9*  PLT 199  --  213  --   --   --  197  --   --   --   HEPARINUNFRC  --   --   --   --   --   < >  --  0.29* 0.51 0.36  CREATININE 0.47  --  0.55  --   --   --   --   --   --   --   < > = values in this interval not displayed.  Estimated Creatinine Clearance: 81.2 mL/min (by C-G formula based on Cr of 0.55).   Assessment: 50 yo F presents on 1/19 secondary to hypoxia. CT of chest shows extensive PE. Heparin started but then held with significant hematochezia. Had some maroon colored blood from rectum last night. GI consulted and did colonoscopy which showed internal hemorrhoids, mild sigmoid diverticulosis, but otherwise normal. Heparin restarted per GI.   Currently on IV heparin at 1450 units/hr. Heparin level remains therapeutic.   Goal of Therapy:  Heparin level 0.3-0.7 units/ml Monitor platelets by anticoagulation protocol: Yes   Plan:  - Continue IV heparin at 1450 units/hr. Avoid boluses when adjusting  - F/u AM heparin level and CBC  Lysle Pearl, PharmD, BCPS Pager # [phone] 02/21/2015 9:17 PM

## 2015-02-21 NOTE — Progress Notes (Signed)
ANTICOAGULATION CONSULT NOTE - Follow Up Consult  Pharmacy Consult for Heparin  Indication: pulmonary embolus  No Known Allergies  Patient Measurements: Height:  (165.1 cm) Weight: 152 lb 5.4 oz (69.1 kg) IBW/kg (Calculated) : 57  Vital Signs: Temp: 97.7 F (36.5 C) (01/23 1244) Temp Source: Axillary (01/23 1244) BP: 140/98 mmHg (01/23 1500) Pulse Rate: 85 (01/23 1500)  Labs:  Recent Labs  02/19/15 0453  02/20/15 0948 02/20/15 1053 02/20/15 1833 02/20/15 2036 02/21/15 0610 02/21/15 0611 02/21/15 1400  HGB 8.0*  < > 11.1* 9.9* 9.5*  --  9.9*  --   --   HCT 25.2*  --  32.5* 29.3* 28.9*  --  30.0*  --   --   PLT 199  --  213  --   --   --  197  --   --   HEPARINUNFRC  --   --   --   --   --  <0.10*  --  0.29* 0.51  CREATININE 0.47  --  0.55  --   --   --   --   --   --   < > = values in this interval not displayed.  Estimated Creatinine Clearance: 81.2 mL/min (by C-G formula based on Cr of 0.55).   Assessment: 50 yo F presents on 1/19 secondary to hypoxia. CT of chest shows extensive PE. Heparin started but then held with significant hematochezia. Had some maroon colored blood from rectum last night. GI consulted and did colonoscopy which showed internal hemorrhoids, mild sigmoid diverticulosis, but otherwise normal. Heparin restarted per GI.   Currently on IV heparin at 1450 units/hr. Heparin level is now therapeutic. RN reports no s/s of bleeding  Goal of Therapy:  Heparin level 0.3-0.7 units/ml Monitor platelets by anticoagulation protocol: Yes   Plan:  -Continue IV heparin at 1450 units/hr. Avoid boluses when adjusting  -2000 confirmatory HL  -Continue to monitor for bleeding  Vinnie Level, PharmD., BCPS Clinical Pharmacist Pager [phone]

## 2015-02-22 LAB — GLUCOSE, CAPILLARY
GLUCOSE-CAPILLARY: 131 mg/dL — AB (ref 65–99)
Glucose-Capillary: 138 mg/dL — ABNORMAL HIGH (ref 65–99)
Glucose-Capillary: 129 mg/dL — ABNORMAL HIGH (ref 65–99)

## 2015-02-22 LAB — CBC
HEMATOCRIT: 32.3 % — AB (ref 36.0–46.0)
HEMOGLOBIN: 10.2 g/dL — AB (ref 12.0–15.0)
MCH: 29.3 pg (ref 26.0–34.0)
MCHC: 31.6 g/dL (ref 30.0–36.0)
MCV: 92.8 fL (ref 78.0–100.0)
Platelets: 256 10*3/uL (ref 150–400)
RBC: 3.48 MIL/uL — ABNORMAL LOW (ref 3.87–5.11)
RDW: 16 % — ABNORMAL HIGH (ref 11.5–15.5)
WBC: 10.4 10*3/uL (ref 4.0–10.5)

## 2015-02-22 LAB — BASIC METABOLIC PANEL
Anion gap: 9 (ref 5–15)
BUN: 7 mg/dL (ref 6–20)
CHLORIDE: 106 mmol/L (ref 101–111)
CO2: 24 mmol/L (ref 22–32)
CREATININE: 0.5 mg/dL (ref 0.44–1.00)
Calcium: 9 mg/dL (ref 8.9–10.3)
Glucose, Bld: 158 mg/dL — ABNORMAL HIGH (ref 65–99)
POTASSIUM: 3.5 mmol/L (ref 3.5–5.1)
SODIUM: 139 mmol/L (ref 135–145)

## 2015-02-22 LAB — PROTIME-INR
INR: 1.14 (ref 0.00–1.49)
PROTHROMBIN TIME: 14.8 s (ref 11.6–15.2)

## 2015-02-22 LAB — CULTURE, BLOOD (ROUTINE X 2): CULTURE: NO GROWTH

## 2015-02-22 LAB — HEPARIN LEVEL (UNFRACTIONATED): HEPARIN UNFRACTIONATED: 0.41 [IU]/mL (ref 0.30–0.70)

## 2015-02-22 MED ORDER — ENOXAPARIN SODIUM 100 MG/ML ~~LOC~~ SOLN: 100.0000 mg | SUBCUTANEOUS | Status: DC

## 2015-02-22 MED ORDER — CLONIDINE HCL 0.1 MG PO TABS
0.1000 mg | ORAL_TABLET | Freq: Two times a day (BID) | ORAL | Status: DC
Start: 2015-02-22 — End: 2015-06-23

## 2015-02-22 MED ORDER — WARFARIN SODIUM 5 MG PO TABS: ORAL_TABLET | ORAL | Status: DC

## 2015-02-22 MED ORDER — WARFARIN SODIUM 5 MG PO TABS: 5.0000 mg | ORAL_TABLET | Freq: Once | ORAL | Status: DC

## 2015-02-22 MED ORDER — INSULIN ASPART 100 UNIT/ML ~~LOC~~ SOLN: 0.0000 [IU] | SUBCUTANEOUS | Status: DC

## 2015-02-22 MED ORDER — INSULIN GLARGINE 100 UNIT/ML ~~LOC~~ SOLN: 10.0000 [IU] | Freq: Every day | SUBCUTANEOUS | Status: DC

## 2015-02-22 MED ORDER — WARFARIN - PHARMACIST DOSING INPATIENT: Freq: Every day | Status: DC

## 2015-02-22 MED ORDER — ENOXAPARIN SODIUM 80 MG/0.8ML ~~LOC~~ SOLN
1.0000 mg/kg | Freq: Two times a day (BID) | SUBCUTANEOUS | Status: DC
  Administered 2015-02-22: 70 mg via SUBCUTANEOUS
  Filled 2015-02-22 (×2): qty 0.8

## 2015-02-22 MED ORDER — HYDROCORTISONE 2.5 % RE CREA
TOPICAL_CREAM | Freq: Two times a day (BID) | RECTAL | Status: DC
Start: 2015-02-22 — End: 2015-05-13

## 2015-02-22 MED ORDER — FENTANYL 25 MCG/HR TD PT72: 25.0000 ug | MEDICATED_PATCH | TRANSDERMAL | Status: DC

## 2015-02-22 NOTE — Progress Notes (Signed)
Report called to [facility] and patient transported by [facility]

## 2015-02-22 NOTE — Discharge Instructions (Signed)
Follow with SNF physician in 2-3 days at facility - Please check daily INR starting from 1/26 - Please check CBC, BMP in 48 hours   Activity: As tolerated with Full fall precautions use walker/cane & assistance as needed   Disposition SNF   Diet: Patient is strict nothing by mouth , oral feeding and medication has to be done through PEG tube.  - continue with tracheostomy collar as per SNF protocol.  For Heart failure patients - Check your Weight same time everyday, if you gain over 2 pounds, or you develop in leg swelling, experience more shortness of breath or chest pain, call your Primary MD immediately. Follow Cardiac Low Salt Diet and 1.5 lit/day fluid restriction.   On your next visit with your primary care physician please Get Medicines reviewed and adjusted.   Please request your Prim.MD to go over all Hospital Tests and Procedure/Radiological results at the follow up, please get all Hospital records sent to your Prim MD by signing hospital release before you go home.   If you experience worsening of your admission symptoms, develop shortness of breath, life threatening emergency, suicidal or homicidal thoughts you must seek medical attention immediately by calling 911 or calling your MD immediately  if symptoms less severe.  You Must read complete instructions/literature along with all the possible adverse reactions/side effects for all the Medicines you take and that have been prescribed to you. Take any new Medicines after you have completely understood and accpet all the possible adverse reactions/side effects.   Do not drive, operating heavy machinery, perform activities at heights, swimming or participation in water activities or provide baby sitting services if your were admitted for syncope or siezures until you have seen by Primary MD or a Neurologist and advised to do so again.  Do not drive when taking Pain medications.    Do not take more than prescribed Pain,  Sleep and Anxiety Medications  Special Instructions: If you have smoked or chewed Tobacco  in the last 2 yrs please stop smoking, stop any regular Alcohol  and or any Recreational drug use.  Wear Seat belts while driving.   Please note  You were cared for by a hospitalist during your hospital stay. If you have any questions about your discharge medications or the care you received while you were in the hospital after you are discharged, you can call the unit and asked to speak with the hospitalist on call if the hospitalist that took care of you is not available. Once you are discharged, your primary care physician will handle any further medical issues. Please note that NO REFILLS for any discharge medications will be authorized once you are discharged, as it is imperative that you return to your primary care physician (or establish a relationship with a primary care physician if you do not have one) for your aftercare needs so that they can reassess your need for medications and monitor your lab values.  Information on my medicine - Coumadin   (Warfarin)  This medication education was reviewed with me or my healthcare representative as part of my discharge preparation.  The pharmacist that spoke with me during my hospital stay was:  Sampson Si, [facility]  Why was Coumadin prescribed for you? Coumadin was prescribed for you because you have a blood clot or a medical condition that can cause an increased risk of forming blood clots. Blood clots can cause serious health problems by blocking the flow of blood to the heart, lung,  or brain. Coumadin can prevent harmful blood clots from forming. As a reminder your indication for Coumadin is:   Pulmonary Embolism Treatment  What test will check on my response to Coumadin? While on Coumadin (warfarin) you will need to have an INR test regularly to ensure that your dose is keeping you in the desired range. The INR (international normalized ratio)  number is calculated from the result of the laboratory test called prothrombin time (PT).  If an INR APPOINTMENT HAS NOT ALREADY BEEN MADE FOR YOU please schedule an appointment to have this lab work done by your health care provider within 7 days. Your INR goal is usually a number between:  2 to 3 or your provider may give you a more narrow range like 2-2.5.  Ask your health care provider during an office visit what your goal INR is.  What  do you need to  know  About  COUMADIN? Take Coumadin (warfarin) exactly as prescribed by your healthcare provider about the same time each day.  DO NOT stop taking without talking to the doctor who prescribed the medication.  Stopping without other blood clot prevention medication to take the place of Coumadin may increase your risk of developing a new clot or stroke.  Get refills before you run out.  What do you do if you miss a dose? If you miss a dose, take it as soon as you remember on the same day then continue your regularly scheduled regimen the next day.  Do not take two doses of Coumadin at the same time.  Important Safety Information A possible side effect of Coumadin (Warfarin) is an increased risk of bleeding. You should call your healthcare provider right away if you experience any of the following: ? Bleeding from an injury or your nose that does not stop. ? Unusual colored urine (red or dark brown) or unusual colored stools (red or black). ? Unusual bruising for unknown reasons. ? A serious fall or if you hit your head (even if there is no bleeding).  Some foods or medicines interact with Coumadin (warfarin) and might alter your response to warfarin. To help avoid this: ? Eat a balanced diet, maintaining a consistent amount of Vitamin K. ? Notify your provider about major diet changes you plan to make. ? Avoid alcohol or limit your intake to 1 drink for women and 2 drinks for men per day. (1 drink is 5 oz. wine, 12 oz. beer, or 1.5 oz.  liquor.)  Make sure that ANY health care provider who prescribes medication for you knows that you are taking Coumadin (warfarin).  Also make sure the healthcare provider who is monitoring your Coumadin knows when you have started a new medication including herbals and non-prescription products.  Coumadin (Warfarin)  Major Drug Interactions  Increased Warfarin Effect Decreased Warfarin Effect  Alcohol (large quantities) Antibiotics (esp. Septra/Bactrim, Flagyl, Cipro) Amiodarone (Cordarone) Aspirin (ASA) Cimetidine (Tagamet) Megestrol (Megace) NSAIDs (ibuprofen, naproxen, etc.) Piroxicam (Feldene) Propafenone (Rythmol SR) Propranolol (Inderal) Isoniazid (INH) Posaconazole (Noxafil) Barbiturates (Phenobarbital) Carbamazepine (Tegretol) Chlordiazepoxide (Librium) Cholestyramine (Questran) Griseofulvin Oral Contraceptives Rifampin Sucralfate (Carafate) Vitamin K   Coumadin (Warfarin) Major Herbal Interactions  Increased Warfarin Effect Decreased Warfarin Effect  Garlic Ginseng Ginkgo biloba Coenzyme Q10 Green tea St. Johns wort    Coumadin (Warfarin) FOOD Interactions  Eat a consistent number of servings per week of foods HIGH in Vitamin K (1 serving =  cup)  Collards (cooked, or boiled & drained) Kale (cooked, or boiled & drained)  Mustard greens (cooked, or boiled & drained) Parsley *serving size only =  cup Spinach (cooked, or boiled & drained) Swiss chard (cooked, or boiled & drained) Turnip greens (cooked, or boiled & drained)  Eat a consistent number of servings per week of foods MEDIUM-HIGH in Vitamin K (1 serving = 1 cup)  Asparagus (cooked, or boiled & drained) Broccoli (cooked, boiled & drained, or raw & chopped) Brussel sprouts (cooked, or boiled & drained) *serving size only =  cup Lettuce, raw (green leaf, endive, romaine) Spinach, raw Turnip greens, raw & chopped   These websites have more information on Coumadin (warfarin):   http://www.king-russell.com/; https://www.hines.net/;

## 2015-02-22 NOTE — Progress Notes (Signed)
CSW informed pt is ready for return to [facility] today- confirmed availability with facility  CSW left message for son, [name], concerning return to rehab- awaiting return call  Merlyn Lot, [facility] Clinical Social Worker [phone]

## 2015-02-22 NOTE — Progress Notes (Addendum)
ANTICOAGULATION CONSULT NOTE - Follow Up Consult  Pharmacy Consult for Heparin/Warfarin  Indication: pulmonary embolus  No Known Allergies  Patient Measurements: Height:  (165.1 cm) Weight: 152 lb 5.4 oz (69.1 kg) IBW/kg (Calculated) : 57  Vital Signs: Temp: 98.8 F (37.1 C) (01/24 0331) Temp Source: Oral (01/24 0331) BP: 146/97 mmHg (01/24 0600) Pulse Rate: 90 (01/24 0600)  Labs:  Recent Labs  02/20/15 0948  02/21/15 0610  02/21/15 1400 02/21/15 2030 02/22/15 0536  HGB 11.1*  < > 9.9*  --   --  9.7* 10.2*  HCT 32.5*  < > 30.0*  --   --  30.9* 32.3*  PLT 213  --  197  --   --   --  256  HEPARINUNFRC  --   < >  --   < > 0.51 0.36 0.41  CREATININE 0.55  --   --   --   --   --   --   < > = values in this interval not displayed.  Estimated Creatinine Clearance: 81.2 mL/min (by C-G formula based on Cr of 0.55).   Assessment: Heparin drip is therapeutic, now starting warfarin, will be day 1/5 overlap  Goal of Therapy:  Heparin level 0.3-0.7 units/ml Monitor platelets by anticoagulation protocol: Yes   Plan:  -Cont heparin slightly at 1450 units/hr -Daily CBC/HL -Warfarin 5 mg PO x 1 at 1800 -Daily PT/INR -Monitor for bleeding -Pt will need 5 day overlap with heparin/warfarin given new PE  Abran Duke 02/22/2015,7:01 AM  ============================= Addendum 7:18 AM Changing heparin to Lovenox -Lovenox 70 mg subcutaneous q12h Abran Duke, PharmD

## 2015-02-22 NOTE — Discharge Summary (Signed)
DEANDRA GOERING, is a 50 y.o. female  DOB Jun 01, 1965  MRN [id number].  Admission date:  02/17/2015  Admitting Physician  Starleen Arms, MD  Discharge Date:  02/22/2015   Primary MD  No primary care provider on file.  Recommendations for primary care physician for things to follow:  - Patient to be seen by SNF physician in 2-3 days - Continue monitor INR daily, with start date 1/26, patient started on warfarin 5 mg oral at 6 PM, first dose tonight, while she is being bridged with Lovenox 1.5 mg/Kg Q24H (100mg  [place] daily), discontinue Lovenox when once INR is therapeutic(equal or more than 2) - Please CBC, Q48H X2.   Admission Diagnosis  Sepsis (HCC) [A41.9]   Discharge Diagnosis  Sepsis (HCC) [A41.9]   Principal Problem:   Sepsis (HCC) Active Problems:   ICH (intracerebral hemorrhage) (HCC)   Acute and chronic respiratory failure with hypoxia (HCC)   Diabetes mellitus (HCC)   Acute pulmonary embolism (HCC)   DVT (deep vein thrombosis) in pregnancy   Lower GI bleeding   Hemorrhoids with complication   Diverticulosis of colon without hemorrhage      Past Medical History  Diagnosis Date  . Hypertension   . Acute respiratory failure (HCC)   . Epilepsy (HCC)   . GERD (gastroesophageal reflux disease)   . Nontraumatic subarachnoid hemorrhage (HCC)   . Urinary retention   . Dysphagia   . Hyperlipidemia   . IBS (irritable bowel syndrome)   . Diabetes mellitus without complication (HCC)     Type 2, W/o complications    Past Surgical History  Procedure Laterality Date  . Radiology with anesthesia N/A 01/13/2015    Procedure: RADIOLOGY WITH ANESTHESIA;  Surgeon: Lisbeth Renshaw, MD;  Location: [facility] OR;  Service: Radiology;  Laterality: N/A;  . Esophagogastroduodenoscopy (egd) with propofol N/A 02/02/2015    Procedure: ESOPHAGOGASTRODUODENOSCOPY (EGD) WITH PROPOFOL;  Surgeon: Jimmye Norman, MD;   Location: [facility] ENDOSCOPY;  Service: General;  Laterality: N/A;  . Peg placement N/A 02/02/2015    Procedure: PERCUTANEOUS ENDOSCOPIC GASTROSTOMY (PEG) PLACEMENT;  Surgeon: Jimmye Norman, MD;  Location: [facility] ENDOSCOPY;  Service: General;  Laterality: N/A;  . Abdominal surgery    . Aneurysm coiling    . Tracheostomy    . Colonoscopy N/A 02/20/2015    Procedure: COLONOSCOPY;  Surgeon: Iva Boop, MD;  Location: [facility] ENDOSCOPY;  Service: Endoscopy;  Laterality: N/A;       History of present illness and  Hospital Course:     Kindly see H&P for history of present illness and admission details, please review complete Labs, Consult reports and Test reports for all details in brief  HPI  from the history and physical done on the day of admission 02/17/2015  Lavanna Rog is a 50 y.o. female, with past medical history of hypertension, recent lengthy hospitalization, secondary to intracranial hemorrhage, status post tracheostomy, PEG tube and chronic Foley, discharged on 02/14/2015 to SNF, EMS were called secondary to hypoxia, saturating in the 80s on 5  L trach collar, respiratory distress, patient was Ambu bag by EMS, in ED workup significant for fever 100.6, hypoxia, tachypnea, and leukocytosis, ABG showing no evidence of CO2 retention or hypoxia, urinalysis positive, sepsis protocol was initiated, started on IV vancomycin and Zosyn, ED consulted critical care, who recommended hospitalist to admit to stepdown.   Hospital Course  50 y.o. female, with past medical history of hypertension, recent lengthy hospitalization, secondary to intracranial hemorrhage, status post tracheostomy, PEG tube and chronic Foley, discharged on 02/14/2015 to SNF, presents with respiratory distress, CTA chest significant for extensive bilateral PE, as well sepsis secondary to UTI, cleared by neurosurgery to start on anticoagulation, she developed lower GI bleed(hematochezia) on heparin drip , required 2 units PRBC on 1/21  ,colonoscopy significant for internal hemorrhoid, resumed on anticoagulation, hemoglobin remained stable, no recurrence of GI bleed.  Sepsis  - Presents with fever, tachypnea, tachycardia and leukocytosis  -  related to UTI ,  - urine culture growing Escherichia coli and Enterobacter, both of them sensitive to Rocephin. - Blood culture growing Staphylococcus coag negative, most likely contaminant,  - Initially on broad-spectrum antibiotics vancomycin and Zosyn, IV vancomycin stopped 1/21, transitioned Zosyn to Rocephin 1/22 >1/24, no further antibiotics on discharge.  - physiology of  sepsis resolved  Pulmonary embolism/left lower extremity DVT - CTA chest significant for extensive bilateral pulmonary embolism extending into lobar, segmental and subsegmental sinus branches throughout the lungs bilaterally. - Patient with recent intracranial hemorrhage, this was discussed with her primary neurosurgeon Dr. 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 - This was discussed with patient sister and son , explained risks versus benefits, and they are agreeable to plan. - Venous Doppler significant for left lower extremity DVT venous Doppler right - Resumed on heparin GTT status post colonoscopy, hemoglobin remained stable, no evidence of recurrent bleed . - long Term anticoagulation plan is with warfarin as it easily reversible, will bridge with Lovenox as INR is therapeutic. - INR to be monitored daily at SNF once INR is therapeutic , then discontinue Lovenox .  Chronic respiratory failure/tracheostomy dependent - Oxygen demand at baseline, remains on aerosolized trach collar. - Continue with tracheostomy care, when necessary nebulizers.   Lower GI bleed - In the setting of heparin drip secondary to bilateral PE. - GI consult appreciated, colonoscopy 1/22 significant for diverticulosis  and internal hemorrhoid, no evidence of active GI bleed. - Transfused 2 units PRBC on 1/21. - patient with known history of hysterectomy, as well patient with hematochezia on digital rectal exam, pelvic ultrasound confirms hysterectomy history, unlikely Vaginal bleed .  Recent intracranial hemorrhage - Continue with supportive care. - Continue with tube feed - Removed staples from neurosurgical site  Diabetes mellitus - CBG has been acceptable during hospital stay, insulin regimen changed on discharge, Lantus 10 units subcutaneous daily, to continue with lower insulin sliding scale  Hypertension - Initially soft, then started to increase, resumed on metoprolol, clonidine, started on amlodipine.   Discharge Condition:  Overall guarded prognosis in this female with extensive bilateral PE, ICH and  GI bleed with anticoagulation, as well vegetative State, with high chance of recurrence of her UTI, aspiration, pneumonia, pressure ulcer, this was communicated to the family.  Follow UP  Follow-up Information    Follow up with [facility], NEELESH, C, MD In 1 week.   Specialty:  Neurosurgery   Contact information:   [address]  [address] [place] [address] [phone]         Discharge Instructions  and  Discharge Medications     Discharge Instructions    Discharge instructions    Complete by:  As directed   Follow with SNF physician in 2-3 days at facility - Please check daily INR starting from 1/26 - Please check CBC, BMP in 48 hours   Activity: As tolerated with Full fall precautions use walker/cane & assistance as needed   Disposition SNF   Diet: Patient is strict nothing by mouth , oral feeding and medication has to be done through PEG tube.  - continue with tracheostomy collar as per SNF protocol.  For Heart failure patients - Check your Weight same time everyday, if you gain over 2 pounds, or you develop in leg swelling, experience more shortness of  breath or chest pain, call your Primary MD immediately. Follow Cardiac Low Salt Diet and 1.5 lit/day fluid restriction.   On your next visit with your primary care physician please Get Medicines reviewed and adjusted.   Please request your Prim.MD to go over all Hospital Tests and Procedure/Radiological results at the follow up, please get all Hospital records sent to your Prim MD by signing hospital release before you go home.   If you experience worsening of your admission symptoms, develop shortness of breath, life threatening emergency, suicidal or homicidal thoughts you must seek medical attention immediately by calling 911 or calling your MD immediately  if symptoms less severe.  You Must read complete instructions/literature along with all the possible adverse reactions/side effects for all the Medicines you take and that have been prescribed to you. Take any new Medicines after you have completely understood and accpet all the possible adverse reactions/side effects.   Do not drive, operating heavy machinery, perform activities at heights, swimming or participation in water activities or provide baby sitting services if your were admitted for syncope or siezures until you have seen by Primary MD or a Neurologist and advised to do so again.  Do not drive when taking Pain medications.    Do not take more than prescribed Pain, Sleep and Anxiety Medications  Special Instructions: If you have smoked or chewed Tobacco  in the last 2 yrs please stop smoking, stop any regular Alcohol  and or any Recreational drug use.  Wear Seat belts while driving.   Please note  You were cared for by a hospitalist during your hospital stay. If you have any questions about your discharge medications or the care you received while you were in the hospital after you are discharged, you can call the unit and asked to speak with the hospitalist on call if the hospitalist that took care of you is not  available. Once you are discharged, your primary care physician will handle any further medical issues. Please note that NO REFILLS for any discharge medications will be authorized once you are discharged, as it is imperative that you return to your primary care physician (or establish a relationship with a primary care physician if you do not have one) for your aftercare needs so that they can reassess your need for medications and monitor your lab values.     Increase activity slowly    Complete by:  As directed             Medication List    STOP taking these medications        antiseptic oral rinse 0.05 % Liqd solution  Commonly known as:  CPC / CETYLPYRIDINIUM CHLORIDE 0.05%     docusate sodium 100 MG capsule  Commonly known as:  COLACE     famotidine 40 MG/5ML suspension  Commonly known as:  PEPCID     lisinopril 10 MG tablet  Commonly known as:  PRINIVIL,ZESTRIL     simvastatin 20 MG tablet  Commonly known as:  ZOCOR     tamsulosin 0.4 MG Caps capsule  Commonly known as:  FLOMAX      TAKE these medications        amLODipine 10 MG tablet  Commonly known as:  NORVASC  Take 1 tablet (10 mg total) by mouth daily.     bethanechol 10 MG tablet  Commonly known as:  URECHOLINE  Take 1 tablet (10 mg total) by mouth every 8 (eight) hours.     cloNIDine 0.1 MG tablet  Commonly known as:  CATAPRES  Take 1 tablet (0.1 mg total) by mouth 2 (two) times daily.     enoxaparin 100 MG/ML injection  Commonly known as:  LOVENOX  Inject 1 mL (100 mg total) into the skin daily. Lovenox  to be stopped when INR is equal or more than 2, as patient is on warfarin.  Start taking on:  02/23/2015     feeding supplement (PRO-STAT SUGAR FREE 64) Liqd  Place 30 mLs into feeding tube 2 (two) times daily.     fentaNYL 25 MCG/HR patch  Commonly known as:  DURAGESIC - dosed mcg/hr  Place 1 patch (25 mcg total) onto the skin every 3 (three) days.     hydrocortisone 2.5 % rectal cream    Commonly known as:  ANUSOL-HC  Place rectally 2 (two) times daily. For 3 days then stop     insulin aspart 100 UNIT/ML injection  Commonly known as:  novoLOG  Inject 0-9 Units into the skin every 4 (four) hours.     insulin glargine 100 UNIT/ML injection  Commonly known as:  LANTUS  Inject 0.1 mLs (10 Units total) into the skin daily.     levETIRAcetam 100 MG/ML solution  Commonly known as:  KEPPRA  Place 5 mLs (500 mg total) into feeding tube 2 (two) times daily.     metoprolol tartrate 25 MG tablet  Commonly known as:  LOPRESSOR  Take 5 tablets (125 mg total) by mouth 2 (two) times daily.     warfarin 5 MG tablet  Commonly known as:  COUMADIN  Please give 5 mg at 6 PM, patient INR to be monitored daily,  and dose to be adjusted keep INR value 2-3.      ASK your doctor about these medications        feeding supplement (JEVITY 1.2 CAL) Liqd  Place 1,000 mLs into feeding tube continuous.          Diet and Activity recommendation: See Discharge Instructions above   Consults obtained -  - GI - [facility] with [facility] CM and GYN via phone.  Major procedures and Radiology Reports - PLEASE review detailed and final reports for all details, in brief -  - Colonoscopy 1/22 by Dr. Leone Payor 1. Mild diverticulosis was noted in the sigmoid colon - could have bled also 2. Internal hemorrhoids - suspect cause of bleeding 3. The examination was otherwise normal  - PRBC transfusion 1/21   Ct Head Wo Contrast  02/04/2015  CLINICAL DATA:  Followup scan.  EVD clamping for 48 hours. EXAM: CT HEAD WITHOUT CONTRAST TECHNIQUE: Contiguous axial  images were obtained from the base of the skull through the vertex without intravenous contrast. COMPARISON:  02/02/2015 FINDINGS: Ventriculostomy catheter position is stable, tip near the foramina [place]. Persistent frothy secretions and mucosal thickening in the sphenoid sinuses in this patient with previous nasal intubation. No new calvarial or orbital  findings. 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 There is mild lateral ventriculomegaly, best seen at the temporal horns, which is stable. No third or fourth ventricular enlargement. Scattered subarachnoid hemorrhage which is much improved compared to admission and stable from most recent scan. An ovoid hematoma along the anterior interhemispheric fissure is stable in size and density is fading. Stable low-density in the bilateral inferior frontal lobes. No evidence of acute infarct. IMPRESSION: 1. Stable mild lateral ventriculomegaly. 2. Stable subarachnoid hemorrhage compared to 2 days ago. 3. Sphenoid sinusitis Electronically Signed   By: Marnee Spring M.D.   On: 02/04/2015 15:47   Ct Head Wo Contrast  02/02/2015  CLINICAL DATA:  Cerebral edema.  Aneurysm coiling. EXAM: CT HEAD WITHOUT CONTRAST TECHNIQUE: Contiguous axial images were obtained from the base of the skull through the vertex without intravenous contrast. 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 Coil remains in good position. Mild diffuse high-density subarachnoid hemorrhage is unchanged. No new hemorrhage. Right frontal ventricular catheter tip at the foramina [place] unchanged. Ventricles are slightly larger compared with the prior study. Negative for acute infarct.  No shift of the midline structures. Air-fluid level in the sphenoid sinus bilaterally similar to the prior study. 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 No new hemorrhage. Ventricular catheter remains in good position. Mild ventricular dilatation compared with the recent CT. Electronically Signed   By: Marlan Palau M.D.   On: 02/02/2015 12:29   Ct Head Wo Contrast  01/28/2015  CLINICAL  DATA:  Followup acute subarachnoid hemorrhage and coiling of anterior cerebral artery aneurysm EXAM: CT HEAD WITHOUT CONTRAST TECHNIQUE: Contiguous axial images were obtained from the base of the skull through the vertex without intravenous contrast. COMPARISON:  01/13/2015 FINDINGS: Ventriculostomy from a right frontal approach enters the frontal horn of the right lateral ventricle in has its tip in the region of the foramen of [place]. Ventricles are mildly larger than were seen on the presenting scan. Coils are present at the location of the anterior communicating artery aneurysm. Anterior interhemispheric blood collide is becoming smaller and less dense. Diffuse subarachnoid hemorrhage is becoming less dense. No definable hyper acute hemorrhage. No convexity subdural or effusion. Layering fluid is present in the sphenoid sinus. No CT evidence of ischemic infarction. IMPRESSION: Previous coiling of anterior communicating artery aneurysm. Right frontal ventriculostomy. Ventricular size is slightly larger than on the presenting examination. Diminishing density of diffuse subarachnoid blood and and interhemispheric hematoma anteriorly. Electronically Signed   By: Paulina Fusi M.D.   On: 01/28/2015 07:30   Ct Angio Chest Pe W/cm &/or Wo Cm  02/17/2015  CLINICAL DATA:  50 year old female with shallow respirations and shortness of breath. Low oxygen saturations. EXAM: CT ANGIOGRAPHY CHEST WITH CONTRAST TECHNIQUE: Multidetector CT imaging of the chest was performed using the standard protocol during bolus administration of intravenous contrast. Multiplanar CT image reconstructions and MIPs were obtained to evaluate the vascular anatomy. CONTRAST:  80mL OMNIPAQUE IOHEXOL 350 MG/ML SOLN COMPARISON:  No priors. FINDINGS: Mediastinum/Lymph Nodes: There are filling  defects in the distal main pulmonary arteries bilaterally extending into lobar, segmental and subsegmental sized branches throughout most of the lungs. No saddle  embolus is identified. Pulmonic trunk measures 2.8 cm in diameter. Right ventricle measures up to 3.2 cm in diameter. Left ventricle measures up to 4.1 cm in diameter. Mild cardiomegaly. Left ventricular concentric hypertrophy. There is no significant pericardial fluid, thickening or pericardial calcification. There is atherosclerosis of the thoracic aorta, the great vessels of the mediastinum and the coronary arteries, including calcified atherosclerotic plaque in the left anterior descending, left circumflex and right coronary arteries. No pathologically enlarged mediastinal or hilar lymph nodes. Esophagus is unremarkable in appearance. No axillary lymphadenopathy. Tracheostomy tube in position with tip terminating approximately 4.6 cm above the carina. Lungs/Pleura: Areas of dependent atelectasis are noted in lower lobes of the lungs bilaterally. No acute consolidative airspace disease. No pleural effusions. No definite suspicious appearing pulmonary nodules or masses. Upper Abdomen: Unremarkable. Musculoskeletal/Soft Tissues: There are no aggressive appearing lytic or blastic lesions noted in the visualized portions of the skeleton. Review of the MIP images confirms the above findings. IMPRESSION: 1. Extensive pulmonary emboli involving distal main pulmonary arteries extending into lobar, segmental and subsegmental sized branches throughout the lungs bilaterally. At this time, there are no overt findings to suggest right heart strain. 2. Cardiomegaly with left ventricular concentric hypertrophy. 3. Dependent atelectasis throughout the lower lobes of the lungs bilaterally. Critical Value/emergent results were called by telephone at the time of interpretation on 02/17/2015 at 7:40 pm to Dr. Silas Flood, who verbally acknowledged these results. Electronically Signed   By: Trudie Reed M.D.   On: 02/17/2015 19:42   US Pelvis Complete  02/19/2015  CLINICAL DATA:  Bleeding. Unsure of bleeding is vaginal or  rectal. Verify history of hysterectomy. EXAM: TRANSABDOMINAL ULTRASOUND OF PELVIS TECHNIQUE: Transabdominal ultrasound examination of the pelvis was performed including evaluation of the uterus, ovaries, adnexal regions, and pelvic cul-de-sac. COMPARISON:  None. FINDINGS: Uterus Not seen, presumed hysterectomy. No mass or fluid identified within the midline pelvis. Right ovary Not seen. No mass or free fluid identified in the right adnexal region. Fairly prominent fluid-filled bowel loops are seen within the right pelvis. Left ovary Not seen. No mass or free fluid identified in the left adnexal region. Fairly prominent fluid-filled bowel loops are also appreciated in the left hemipelvis. Other findings:  No free fluid seen. IMPRESSION: 1. Uterus not identified, presumed hysterectomy. No mass or free fluid seen within the midline pelvis. 2. Neither ovary is seen, perhaps bilateral oophorectomies, perhaps obscured by the fairly prominent fluid-filled bowel loops in the pelvis. No mass or free fluid seen within either adnexal region. Electronically Signed   By: Bary Richard M.D.   On: 02/19/2015 16:25   Dg Chest Portable 1 View  02/20/2015  CLINICAL DATA:  50 year old female with possible aspiration. EXAM: PORTABLE CHEST 1 VIEW COMPARISON:  Radiograph dated 02/17/2015 and chest CT dated 02/17/2015 for go FINDINGS: Tracheostomy with tip above the carina. Single portable view of the chest does not demonstrate a focal consolidation. There is no pleural effusion or pneumothorax. Mild cardiomegaly. No acute osseous pathology. IMPRESSION: No focal consolidation. Electronically Signed   By: Elgie Collard M.D.   On: 02/20/2015 06:04   Dg Chest Portable 1 View  02/17/2015  CLINICAL DATA:  Shortness of breath.  Trauma.  Unresponsive patient. EXAM: PORTABLE CHEST 1 VIEW COMPARISON:  02/06/2015 FINDINGS: Tracheostomy tube projects along the tracheal air column. Low lung volumes are present, causing  crowding of the  pulmonary vasculature. Mild enlargement of the cardiopericardial silhouette, without edema. No discrete airspace opacity identified. No pneumothorax. IMPRESSION: 1. Low lung volumes but the lungs appear clear. Tracheostomy tube noted. 2. Mild enlargement of the cardiopericardial silhouette. Electronically Signed   By: Gaylyn Rong M.D.   On: 02/17/2015 15:40   Dg Chest Port 1 View  02/06/2015  CLINICAL DATA:  Patient with history of fever. EXAM: PORTABLE CHEST 1 VIEW COMPARISON:  Chest radiograph 02/05/2015. FINDINGS: Tracheostomy tube terminates the mid trachea. Multiple monitoring leads overlie the patient. Stable enlarged cardiac and mediastinal contours. Elevation of the right hemidiaphragm. Unchanged heterogeneous opacities left lung base. No pleural effusion or pneumothorax. IMPRESSION: Unchanged retrocardiac opacity which may represent atelectasis or infection. Electronically Signed   By: Annia Belt M.D.   On: 02/06/2015 08:51   Dg Chest Port 1 View  02/05/2015  CLINICAL DATA:  50 year old female with fever EXAM: PORTABLE CHEST 1 VIEW COMPARISON:  Prior chest x-ray obtained earlier today at 6 a.m. FINDINGS: Tracheostomy tube remains in stable position. Unchanged cardiomegaly. Unchanged bilateral retrocardiac airspace opacities with a linear configuration slightly more prominent on the left than the right remains unchanged. No acute osseous abnormality. IMPRESSION: No interval change in the appearance of the chest compared to earlier this morning. Persistent left greater than right basilar atelectasis. Electronically Signed   By: Malachy Moan M.D.   On: 02/05/2015 08:55   Dg Chest Port 1 View  02/05/2015  CLINICAL DATA:  Respiratory failure EXAM: PORTABLE CHEST 1 VIEW COMPARISON:  02/01/2015 chest radiograph. FINDINGS: Tracheostomy tube tip overlies the tracheal air column 3.5 cm above the carina. Stable cardiomediastinal silhouette with mild cardiomegaly. No pneumothorax. No pleural  effusion. Stable mild platelike atelectasis at the left lung base. No pulmonary edema. IMPRESSION: Well-positioned tracheostomy tube. Stable mild cardiomegaly without overt pulmonary edema. Stable mild platelike atelectasis at the left lung base. Electronically Signed   By: Delbert Phenix M.D.   On: 02/05/2015 07:42   Dg Chest Port 1 View  02/01/2015  CLINICAL DATA:  Respiratory failure. EXAM: PORTABLE CHEST 1 VIEW COMPARISON:  January 28, 2015. FINDINGS: Stable cardiomediastinal silhouette. No pneumothorax or pleural effusion is noted. Tracheostomy tube is unchanged in position. Distal tip of feeding tube is seen in expected position of distal stomach. Stable position of right internal jugular catheter line with distal tip in expected position of upper right atrium. Both lungs are clear. The visualized skeletal structures are unremarkable. IMPRESSION: Stable support apparatus. No acute cardiopulmonary abnormality seen. Electronically Signed   By: Lupita Raider, M.D.   On: 02/01/2015 12:21   Dg Chest Port 1 View  01/28/2015  CLINICAL DATA:  Respiratory failure. EXAM: PORTABLE CHEST 1 VIEW COMPARISON:  01/27/2015 FINDINGS: Again noted is a tracheostomy tube. Jugular central line tip is in the upper right atrium. Feeding tube extends into the abdomen. There is mild peribronchial thickening with increased densities at the lung bases. Slightly low lung volumes. Heart size is stable. IMPRESSION: Low lung volumes with mild peribronchial thickening. Cannot exclude mild interstitial edema. Support apparatuses as described. Electronically Signed   By: Richarda Overlie M.D.   On: 01/28/2015 08:04   Dg Chest Port 1 View  01/27/2015  CLINICAL DATA:  New tracheostomy EXAM: PORTABLE CHEST 1 VIEW COMPARISON:  Chest radiograph from one day prior. FINDINGS: Tracheostomy tube tip overlies the tracheal air column just below the thoracic inlet. Enteric tube enters the stomach with the tip not seen on this image.  Right internal  jugular central venous catheter terminates at the cavoatrial junction. Stable cardiomediastinal silhouette with normal heart size. No pneumothorax. No pleural effusion. Stable mild left basilar atelectasis. No pulmonary edema. No new lung opacity. IMPRESSION: 1. Well-positioned tracheostomy tube. 2. Mild left basilar atelectasis. Otherwise no active cardiopulmonary disease. Electronically Signed   By: Delbert Phenix M.D.   On: 01/27/2015 11:33   Dg Chest Port 1 View  01/26/2015  CLINICAL DATA:  Respiratory failure. EXAM: PORTABLE CHEST 1 VIEW COMPARISON:  01/24/2015 FINDINGS: Endotracheal tube is 1.9 cm above the carina. Central line tip in the upper right atrium. Small nodular density in the right upper chest probably related to the ECG lead. Streaky densities in the left lower lung are suggestive for atelectasis. Heart size is normal. Negative for a pneumothorax. Nasogastric tube extends into the abdomen. IMPRESSION: Few densities at the left lung base are suggestive for atelectasis. Otherwise, no focal airspace disease. Support apparatuses as described. Subtle nodular density in the right upper chest is probably related to the overlying support apparatus as described. Recommend attention to this area on follow up imaging. Electronically Signed   By: Richarda Overlie M.D.   On: 01/26/2015 07:42   Dg Chest Port 1 View  01/24/2015  CLINICAL DATA:  Endotracheal tube placement. EXAM: PORTABLE CHEST 1 VIEW COMPARISON:  01/23/2015 and 01/22/2015. FINDINGS: 0555 hours. The endotracheal tube has been advanced and is now 1 cm above the carina. Right IJ central venous catheter projects to the mid right atrial level. Nasogastric tube projects below the diaphragm, tip not visualized. The heart size and mediastinal contours are stable. There is stable mild atelectasis at both lung bases. No pneumothorax or significant pleural effusion identified. Multiple lines overlie the chest. IMPRESSION: The endotracheal tube is low with  its tip within 1 cm of carina. For more optimal positioning, this could be withdrawn 3-4 cm. No other significant changes. Electronically Signed   By: Carey Bullocks M.D.   On: 01/24/2015 08:07   Dg Abd Portable 1v  02/09/2015  CLINICAL DATA:  New onset nausea. EXAM: PORTABLE ABDOMEN - 1 VIEW COMPARISON:  01/27/2015 FINDINGS: There is a gastrostomy tube present. There is no bowel dilatation to suggest obstruction. There is no evidence of pneumoperitoneum, portal venous gas or pneumatosis. There are no pathologic calcifications along the expected course of the ureters. The osseous structures are unremarkable. IMPRESSION: Negative. Electronically Signed   By: Elige Ko   On: 02/09/2015 08:17   Dg Abd Portable 1v  01/27/2015  CLINICAL DATA:  Patient status post feeding tube placement. EXAM: PORTABLE ABDOMEN - 1 VIEW COMPARISON:  Abdominal radiograph 01/13/2015. FINDINGS: Feeding tube is present with tip projecting at the gastric antrum. Gas is demonstrated throughout the colon. No definite evidence for free intraperitoneal air. Cardiomegaly. Focal consolidative opacity within the left lung base. IMPRESSION: Feeding tube tip projects at the gastric antrum. Focal nodular consolidative opacity within the left lung base potentially secondary to an infectious process or atelectasis. Continued radiographic follow-up is recommended to ensure resolution. Electronically Signed   By: Annia Belt M.D.   On: 01/27/2015 11:39   US Abdomen Limited Ruq  02/11/2015  CLINICAL DATA:  Elevated liver enzymes. EXAM: US ABDOMEN LIMITED - RIGHT UPPER QUADRANT COMPARISON:  None. FINDINGS: Gallbladder: No gallstones or wall thickening visualized. No sonographic Murphy sign noted by sonographer. Common bile duct: Diameter: 2.7 mm Liver: No focal lesion identified. Within normal limits in parenchymal echogenicity. IMPRESSION: Negative exam. Electronically Signed  ByMaisie Fus  Register   On: 02/11/2015 07:42    Micro Results      Recent Results (from the past 240 hour(s))  Blood culture (routine x 2)     Status: None   Collection Time: 02/17/15  3:18 PM  Result Value Ref Range Status   Specimen Description BLOOD RIGHT HAND  Final   Special Requests BOTTLES DRAWN AEROBIC AND ANAEROBIC 5CC  Final   Culture  Setup Time   Final    GRAM POSITIVE COCCI IN CLUSTERS AEROBIC BOTTLE ONLY CRITICAL RESULT CALLED TO, READ BACK BY AND VERIFIED WITH: Foye Deer RN 2013 02/18/15 A BROWNING    Culture   Final    STAPHYLOCOCCUS SPECIES (COAGULASE NEGATIVE) THE SIGNIFICANCE OF ISOLATING THIS ORGANISM FROM A SINGLE SET OF BLOOD CULTURES WHEN MULTIPLE SETS ARE DRAWN IS UNCERTAIN. PLEASE NOTIFY THE MICROBIOLOGY DEPARTMENT WITHIN ONE WEEK IF SPECIATION AND SENSITIVITIES ARE REQUIRED.    Report Status 02/20/2015 FINAL  Final  Blood culture (routine x 2)     Status: None (Preliminary result)   Collection Time: 02/17/15  3:20 PM  Result Value Ref Range Status   Specimen Description BLOOD BLOOD LEFT FOREARM  Final   Special Requests   Final    BOTTLES DRAWN AEROBIC AND ANAEROBIC 5CC AER 4CC ANA   Culture NO GROWTH 4 DAYS  Final   Report Status PENDING  Incomplete  Urine culture     Status: None   Collection Time: 02/17/15  4:07 PM  Result Value Ref Range Status   Specimen Description URINE, CATHETERIZED  Final   Special Requests NONE  Final   Culture   Final    >=100,000 COLONIES/mL ESCHERICHIA COLI >=100,000 COLONIES/mL ENTEROBACTER CLOACAE    Report Status 02/20/2015 FINAL  Final   Organism ID, Bacteria ESCHERICHIA COLI  Final   Organism ID, Bacteria ENTEROBACTER CLOACAE  Final      Susceptibility   Enterobacter cloacae - MIC*    CEFAZOLIN >=64 RESISTANT Resistant     CEFTRIAXONE <=1 SENSITIVE Sensitive     CIPROFLOXACIN <=0.25 SENSITIVE Sensitive     GENTAMICIN <=1 SENSITIVE Sensitive     IMIPENEM 0.5 SENSITIVE Sensitive     NITROFURANTOIN 64 INTERMEDIATE Intermediate     TRIMETH/SULFA <=20 SENSITIVE Sensitive      PIP/TAZO 8 SENSITIVE Sensitive     * >=100,000 COLONIES/mL ENTEROBACTER CLOACAE   Escherichia coli - MIC*    AMPICILLIN 8 SENSITIVE Sensitive     CEFAZOLIN <=4 SENSITIVE Sensitive     CEFTRIAXONE <=1 SENSITIVE Sensitive     CIPROFLOXACIN <=0.25 SENSITIVE Sensitive     GENTAMICIN <=1 SENSITIVE Sensitive     IMIPENEM <=0.25 SENSITIVE Sensitive     NITROFURANTOIN <=16 SENSITIVE Sensitive     TRIMETH/SULFA <=20 SENSITIVE Sensitive     AMPICILLIN/SULBACTAM 4 SENSITIVE Sensitive     PIP/TAZO <=4 SENSITIVE Sensitive     * >=100,000 COLONIES/mL ESCHERICHIA COLI  MRSA PCR Screening     Status: None   Collection Time: 02/17/15  8:13 PM  Result Value Ref Range Status   MRSA by PCR NEGATIVE NEGATIVE Final    Comment:        The GeneXpert MRSA Assay (FDA approved for NASAL specimens only), is one component of a comprehensive MRSA colonization surveillance program. It is not intended to diagnose MRSA infection nor to guide or monitor treatment for MRSA infections.   C difficile quick scan w PCR reflex     Status: None   Collection Time:  02/18/15  8:34 PM  Result Value Ref Range Status   C Diff antigen NEGATIVE NEGATIVE Final   C Diff toxin NEGATIVE NEGATIVE Final   C Diff interpretation Negative for toxigenic C. difficile  Final  Culture, respiratory (NON-Expectorated)     Status: None (Preliminary result)   Collection Time: 02/21/15  6:33 AM  Result Value Ref Range Status   Specimen Description TRACHEAL ASPIRATE  Final   Special Requests NONE  Final   Gram Stain   Final    MODERATE WBC PRESENT,BOTH PMN AND MONONUCLEAR NO SQUAMOUS EPITHELIAL CELLS SEEN RARE GRAM NEGATIVE RODS Performed at [facility]    Culture   Final    Culture reincubated for better growth Performed at [facility]    Report Status PENDING  Incomplete       Today   Subjective:   Syra Sirmons is noncommunicative, can't provide any complaints , no significant events overnight    Objective:   Blood pressure 157/101, pulse 97, temperature 99.2 F (37.3 C), temperature source Oral, resp. rate 24, height  (1.651 m), weight 69.1 kg (152 lb 5.4 oz), SpO2 99 %.   Intake/Output Summary (Last 24 hours) at 02/22/15 1015 Last data filed at 02/22/15 0700  Gross per 24 hour  Intake 2404.5 ml  Output   2800 ml  Net -395.5 ml    Exam Laying in bed in no apparent distress, appears comfortable  Supple Neck,No JVD,tracheostomy +, site looks clean  Symmetrical Chest wall movement, Good air movement bilaterally, no wheezing. RRR,No Gallops,Rubs or new Murmurs, No Parasternal Heave +ve B.Sounds, Abd Soft, No tenderness, PEG +,No rebound - guarding or rigidity.  No Cyanosis, Clubbing or edema, No new Rash or bruise  Data Review   CBC w Diff: Lab Results  Component Value Date   WBC 10.4 02/22/2015   HGB 10.2* 02/22/2015   HCT 32.3* 02/22/2015   PLT 256 02/22/2015   LYMPHOPCT 18 02/19/2015   MONOPCT 8 02/19/2015   EOSPCT 1 02/19/2015   BASOPCT 0 02/19/2015    CMP: Lab Results  Component Value Date   NA 139 02/22/2015   K 3.5 02/22/2015   CL 106 02/22/2015   CO2 24 02/22/2015   BUN 7 02/22/2015   CREATININE 0.50 02/22/2015   PROT 7.4 02/17/2015   ALBUMIN 2.9* 02/17/2015   BILITOT 0.1* 02/17/2015   ALKPHOS 144* 02/17/2015   AST 27 02/17/2015   ALT 57* 02/17/2015  .   Total Time in preparing paper work, data evaluation and todays exam - 35 minutes  Selby Foisy M.D on 02/22/2015 at 10:15 AM  [facility]   Office  [phone]

## 2015-02-22 NOTE — Progress Notes (Signed)
Patient will discharge to [facility] Anticipated discharge date:1/24 Family notified: pt son Transportation by [employer]- scheduled for 2:30pm  CSW signing off.  Merlyn Lot, LCSWA Clinical Social Worker [phone]

## 2015-02-23 ENCOUNTER — Encounter: Payer: Self-pay | Admitting: Adult Health

## 2015-02-23 ENCOUNTER — Non-Acute Institutional Stay (SKILLED_NURSING_FACILITY): Payer: Medicaid Other | Admitting: Adult Health

## 2015-02-23 DIAGNOSIS — I10 Essential (primary) hypertension: Secondary | ICD-10-CM

## 2015-02-23 DIAGNOSIS — E1159 Type 2 diabetes mellitus with other circulatory complications: Secondary | ICD-10-CM | POA: Diagnosis not present

## 2015-02-23 DIAGNOSIS — I610 Nontraumatic intracerebral hemorrhage in hemisphere, subcortical: Secondary | ICD-10-CM

## 2015-02-23 DIAGNOSIS — J9611 Chronic respiratory failure with hypoxia: Secondary | ICD-10-CM | POA: Diagnosis not present

## 2015-02-23 DIAGNOSIS — I824Y3 Acute embolism and thrombosis of unspecified deep veins of proximal lower extremity, bilateral: Secondary | ICD-10-CM | POA: Diagnosis not present

## 2015-02-23 DIAGNOSIS — R1310 Dysphagia, unspecified: Secondary | ICD-10-CM | POA: Diagnosis not present

## 2015-02-23 DIAGNOSIS — I2692 Saddle embolus of pulmonary artery without acute cor pulmonale: Secondary | ICD-10-CM

## 2015-02-23 DIAGNOSIS — R339 Retention of urine, unspecified: Secondary | ICD-10-CM

## 2015-02-24 ENCOUNTER — Non-Acute Institutional Stay (SKILLED_NURSING_FACILITY): Payer: Medicaid Other | Admitting: Internal Medicine

## 2015-02-24 DIAGNOSIS — A4151 Sepsis due to Escherichia coli [E. coli]: Secondary | ICD-10-CM | POA: Diagnosis not present

## 2015-02-24 DIAGNOSIS — I2692 Saddle embolus of pulmonary artery without acute cor pulmonale: Secondary | ICD-10-CM | POA: Diagnosis not present

## 2015-02-24 DIAGNOSIS — J9611 Chronic respiratory failure with hypoxia: Secondary | ICD-10-CM

## 2015-02-24 DIAGNOSIS — R569 Unspecified convulsions: Secondary | ICD-10-CM | POA: Diagnosis not present

## 2015-02-24 DIAGNOSIS — K922 Gastrointestinal hemorrhage, unspecified: Secondary | ICD-10-CM

## 2015-02-24 DIAGNOSIS — I10 Essential (primary) hypertension: Secondary | ICD-10-CM

## 2015-02-24 DIAGNOSIS — I610 Nontraumatic intracerebral hemorrhage in hemisphere, subcortical: Secondary | ICD-10-CM | POA: Diagnosis not present

## 2015-02-24 DIAGNOSIS — E1159 Type 2 diabetes mellitus with other circulatory complications: Secondary | ICD-10-CM | POA: Diagnosis not present

## 2015-02-24 LAB — CULTURE, RESPIRATORY W GRAM STAIN

## 2015-02-24 LAB — CULTURE, RESPIRATORY

## 2015-02-28 NOTE — Progress Notes (Signed)
MRN: [id number] Name: Rachel Vang  Sex: female Age: 50 y.o. DOB: 08-07-1965  PSC #: Ronni Rumble Facility/Room: Level Of Care: SNF Provider: Merrilee Seashore D Emergency Contacts: Extended Emergency Contact Information Primary Emergency Contact: Watt,Carlton  [place] Mobile Phone: [phone] Relation: Son Secondary Emergency Contact: Debroah [place] Mobile Phone: [phone] Relation: Son  Code Status:   Allergies: Review of patient's allergies indicates no known allergies.  Chief Complaint  Patient presents with  . Readmit To SNF    HPI: Patient is 50 y.o. female with past medical history of hypertension, recent lengthy hospitalization, secondary to intracranial hemorrhage, status post tracheostomy, PEG tube and chronic Foley, discharged on 02/14/2015 to SNF, EMS were called secondary to hypoxia, saturating in the 80s on 5 L trach collar, respiratory distress, patient was Ambu bag by EMS, in ED workup significant for fever 100.6, hypoxia, tachypnea, and leukocytosis. Pt was admitted to [facility] from 1/19-1/24 where she was treated for sepsis related to UTI and was dx with B PE. Hospital course was complicated by acute blood loss anemia form heparin drip requring transfusion. Hb was stable after. Pt is admitted to SNF for care of her vegitative state along with many complicated problems. While at SNF pt will be follwed for HTN, tx with norvasc,clonidine and metoprolol, DM, tx with insulin and seizures tx with keppra.  Past Medical History  Diagnosis Date  . Hypertension   . Acute respiratory failure (HCC)   . Epilepsy (HCC)   . GERD (gastroesophageal reflux disease)   . Nontraumatic subarachnoid hemorrhage (HCC)   . Urinary retention   . Dysphagia   . Hyperlipidemia   . IBS (irritable bowel syndrome)   . Diabetes mellitus without complication (HCC)     Type 2, W/o complications    Past Surgical History  Procedure Laterality Date   . Radiology with anesthesia N/A 01/13/2015    Procedure: RADIOLOGY WITH ANESTHESIA;  Surgeon: Lisbeth Renshaw, MD;  Location: [facility] OR;  Service: Radiology;  Laterality: N/A;  . Esophagogastroduodenoscopy (egd) with propofol N/A 02/02/2015    Procedure: ESOPHAGOGASTRODUODENOSCOPY (EGD) WITH PROPOFOL;  Surgeon: Jimmye Norman, MD;  Location: [facility] ENDOSCOPY;  Service: General;  Laterality: N/A;  . Peg placement N/A 02/02/2015    Procedure: PERCUTANEOUS ENDOSCOPIC GASTROSTOMY (PEG) PLACEMENT;  Surgeon: Jimmye Norman, MD;  Location: [facility] ENDOSCOPY;  Service: General;  Laterality: N/A;  . Abdominal surgery    . Aneurysm coiling    . Tracheostomy    . Colonoscopy N/A 02/20/2015    Procedure: COLONOSCOPY;  Surgeon: Iva Boop, MD;  Location: [facility] ENDOSCOPY;  Service: Endoscopy;  Laterality: N/A;      Medication List       This list is accurate as of: 02/24/15 11:59 PM.  Always use your most recent med list.               amLODipine 10 MG tablet  Commonly known as:  NORVASC  Take 1 tablet (10 mg total) by mouth daily.     bethanechol 10 MG tablet  Commonly known as:  URECHOLINE  Take 1 tablet (10 mg total) by mouth every 8 (eight) hours.     cloNIDine 0.1 MG tablet  Commonly known as:  CATAPRES  Take 1 tablet (0.1 mg total) by mouth 2 (two) times daily.     enoxaparin 100 MG/ML injection  Commonly known as:  LOVENOX  Inject 1 mL (100 mg total) into the skin daily. Lovenox  to be stopped when  INR is equal or more than 2, as patient is on warfarin.     feeding supplement (JEVITY 1.2 CAL) Liqd  Place 1,000 mLs into feeding tube continuous.     feeding supplement (PRO-STAT SUGAR FREE 64) Liqd  Place 30 mLs into feeding tube 2 (two) times daily.     fentaNYL 25 MCG/HR patch  Commonly known as:  DURAGESIC - dosed mcg/hr  Place 1 patch (25 mcg total) onto the skin every 3 (three) days.     hydrocortisone 2.5 % rectal cream  Commonly known as:  ANUSOL-HC  Place rectally 2 (two) times daily.  For 3 days then stop     insulin aspart 100 UNIT/ML injection  Commonly known as:  novoLOG  Inject 0-9 Units into the skin every 4 (four) hours.     insulin glargine 100 UNIT/ML injection  Commonly known as:  LANTUS  Inject 0.1 mLs (10 Units total) into the skin daily.     levETIRAcetam 100 MG/ML solution  Commonly known as:  KEPPRA  Place 5 mLs (500 mg total) into feeding tube 2 (two) times daily.     metoprolol tartrate 25 MG tablet  Commonly known as:  LOPRESSOR  Take 5 tablets (125 mg total) by mouth 2 (two) times daily.     warfarin 5 MG tablet  Commonly known as:  COUMADIN  Please give 5 mg at 6 PM, patient INR to be monitored daily,  and dose to be adjusted keep INR value 2-3.        No orders of the defined types were placed in this encounter.     There is no immunization history on file for this patient.  Social History  Substance Use Topics  . Smoking status: Former Games developer  . Smokeless tobacco: Never Used  . Alcohol Use: No    Family history is + HTN, DM   Review of Systems UTO, pt vegetative; per nursing no concerns    Filed Vitals:   02/28/15 1905  BP: 147/100  Pulse: 106  Temp: 98.9 F (37.2 C)  Resp: 20    SpO2 Readings from Last 1 Encounters:  02/28/15 97%        Physical Exam  GENERAL APPEARANCE: eyes open, vegetative   No acute distress.  SKIN: No diaphoresis rash HEAD: Normocephalic, atraumatic , turned chronically to the left EYES: Conjunctiva/lids clear. Pupils round, reactive. EOM without purpose  EARS: External exam WNL, canals clear. Hearing grossly normal- pt appears to respond at times to voice NOSE: No deformity or discharge.  MOUTH/THROAT: Lips w/o lesions  RESPIRATORY: Breathing is even, unlabored. Lung sounds are rhonchi   CARDIOVASCULAR: Heart RRR no murmurs, rubs or gallops. No peripheral edema.   GASTROINTESTINAL: Abdomen is soft, non-tender, not distended w/ normal bowel sounds;PEG tube. GENITOURINARY: Bladder  non tender, not distended  MUSCULOSKELETAL: No abnormal joints or musculature NEUROLOGIC:  Pt vegetative, sometimes moves L hand PSYCHIATRIC: n/a  Patient Active Problem List   Diagnosis Date Noted  . Chronic respiratory failure (HCC) 03/06/2015  . Hemorrhoids with complication   . Diverticulosis of colon without hemorrhage   . Acute pulmonary embolism (HCC) 02/19/2015  . DVT (deep vein thrombosis) in pregnancy 02/19/2015  . Lower GI bleeding 02/19/2015  . Dysphagia 02/18/2015  . Enteritis due to Clostridium difficile 02/18/2015  . HTN (hypertension) 02/18/2015  . Hemophilus influenzae (H. influenzae) as the cause of diseases classified elsewhere 02/18/2015  . Tracheobronchitis 02/18/2015  . E. coli UTI 02/18/2015  . Seizures (HCC)  02/18/2015  . Elevated troponin 02/18/2015  . Urinary retention 02/18/2015  . Sepsis (HCC) 02/17/2015  . Diabetes mellitus (HCC) 02/17/2015  . History of ETT   . Hydrocephalus   . Tracheostomy in place [facility])   . Cerebral edema (HCC)   . Acute and chronic respiratory failure with hypoxia (HCC)   . Central line complication   . Encounter for central line care   . Encounter for feeding tube placement   . Neurological abnormality   . Subarachnoid hemorrhage (HCC)   . Acute respiratory failure with hypoxia and hypercapnia (HCC)   . Encounter for orogastric (OG) tube placement   . SAH (subarachnoid hemorrhage) (HCC)   . ICH (intracerebral hemorrhage) (HCC) 01/13/2015    CBC    Component Value Date/Time   WBC 10.4 02/22/2015 0536   RBC 3.48* 02/22/2015 0536   HGB 10.2* 02/22/2015 0536   HCT 32.3* 02/22/2015 0536   PLT 256 02/22/2015 0536   MCV 92.8 02/22/2015 0536   LYMPHSABS 2.2 02/19/2015 0130   MONOABS 1.0 02/19/2015 0130   EOSABS 0.1 02/19/2015 0130   BASOSABS 0.0 02/19/2015 0130    CMP     Component Value Date/Time   NA 139 02/22/2015 0536   K 3.5 02/22/2015 0536   CL 106 02/22/2015 0536   CO2 24 02/22/2015 0536   GLUCOSE 158*  02/22/2015 0536   BUN 7 02/22/2015 0536   CREATININE 0.50 02/22/2015 0536   CALCIUM 9.0 02/22/2015 0536   PROT 7.4 02/17/2015 1518   ALBUMIN 2.9* 02/17/2015 1518   AST 27 02/17/2015 1518   ALT 57* 02/17/2015 1518   ALKPHOS 144* 02/17/2015 1518   BILITOT 0.1* 02/17/2015 1518   GFRNONAA >60 02/22/2015 0536   GFRAA >60 02/22/2015 0536    Lab Results  Component Value Date   HGBA1C 7.1* 02/08/2015     Ct Angio Chest Pe W/cm &/or Wo Cm  02/17/2015  CLINICAL DATA:  50 year old female with shallow respirations and shortness of breath. Low oxygen saturations. EXAM: CT ANGIOGRAPHY CHEST WITH CONTRAST TECHNIQUE: Multidetector CT imaging of the chest was performed using the standard protocol during bolus administration of intravenous contrast. Multiplanar CT image reconstructions and MIPs were obtained to evaluate the vascular anatomy. CONTRAST:  80mL OMNIPAQUE IOHEXOL 350 MG/ML SOLN COMPARISON:  No priors. FINDINGS: Mediastinum/Lymph Nodes: There are filling defects in the distal main pulmonary arteries bilaterally extending into lobar, segmental and subsegmental sized branches throughout most of the lungs. No saddle embolus is identified. Pulmonic trunk measures 2.8 cm in diameter. Right ventricle measures up to 3.2 cm in diameter. Left ventricle measures up to 4.1 cm in diameter. Mild cardiomegaly. Left ventricular concentric hypertrophy. There is no significant pericardial fluid, thickening or pericardial calcification. There is atherosclerosis of the thoracic aorta, the great vessels of the mediastinum and the coronary arteries, including calcified atherosclerotic plaque in the left anterior descending, left circumflex and right coronary arteries. No pathologically enlarged mediastinal or hilar lymph nodes. Esophagus is unremarkable in appearance. No axillary lymphadenopathy. Tracheostomy tube in position with tip terminating approximately 4.6 cm above the carina. Lungs/Pleura: Areas of dependent  atelectasis are noted in lower lobes of the lungs bilaterally. No acute consolidative airspace disease. No pleural effusions. No definite suspicious appearing pulmonary nodules or masses. Upper Abdomen: Unremarkable. Musculoskeletal/Soft Tissues: There are no aggressive appearing lytic or blastic lesions noted in the visualized portions of the skeleton. Review of the MIP images confirms the above findings. IMPRESSION: 1. Extensive pulmonary emboli involving distal main pulmonary  arteries extending into lobar, segmental and subsegmental sized branches throughout the lungs bilaterally. At this time, there are no overt findings to suggest right heart strain. 2. Cardiomegaly with left ventricular concentric hypertrophy. 3. Dependent atelectasis throughout the lower lobes of the lungs bilaterally. Critical Value/emergent results were called by telephone at the time of interpretation on 02/17/2015 at 7:40 pm to Dr. Silas Flood, who verbally acknowledged these results. Electronically Signed   By: Trudie Reed M.D.   On: 02/17/2015 19:42   Dg Chest Portable 1 View  02/17/2015  CLINICAL DATA:  Shortness of breath.  Trauma.  Unresponsive patient. EXAM: PORTABLE CHEST 1 VIEW COMPARISON:  02/06/2015 FINDINGS: Tracheostomy tube projects along the tracheal air column. Low lung volumes are present, causing crowding of the pulmonary vasculature. Mild enlargement of the cardiopericardial silhouette, without edema. No discrete airspace opacity identified. No pneumothorax. IMPRESSION: 1. Low lung volumes but the lungs appear clear. Tracheostomy tube noted. 2. Mild enlargement of the cardiopericardial silhouette. Electronically Signed   By: Gaylyn Rong M.D.   On: 02/17/2015 15:40    Not all labs, radiology exams or other studies done during hospitalization come through on my EPIC note; however they are reviewed by me.    Assessment and Plan  Sepsis (HCC) Presents with fever, tachypnea, tachycardia and  leukocytosis  - related to UTI ,  - urine culture growing Escherichia coli and Enterobacter, both of them sensitive to Rocephin. - Blood culture growing Staphylococcus coag negative, most likely contaminant,  - Initially on broad-spectrum antibiotics vancomycin and Zosyn, IV vancomycin stopped 1/21, transitioned Zosyn to Rocephin 1/22 >1/24, no further antibiotics on discharge.  - physiology of sepsis resolved  Acute pulmonary embolism (HCC) CTA chest significant for extensive bilateral pulmonary embolism extending into lobar, segmental and subsegmental sinus branches throughout the lungs bilaterally. - Patient with recent intracranial hemorrhage, this was discussed with her primary neurosurgeon Dr. 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 - This was discussed with patient sister and son , explained risks versus benefits, and they are agreeable to plan. - Venous Doppler significant for left lower extremity DVT venous Doppler right - Resumed on heparin GTT status post colonoscopy, hemoglobin remained stable, no evidence of recurrent bleed . - long Term anticoagulation plan is with warfarin as it easily reversible, will bridge with Lovenox as INR is therapeutic. SNF-INR to be monitored daily; once INR is therapeutic , then discontinue Lovenox;warfarin will be actively titrated   Chronic respiratory failure (HCC) Oxygen demand at baseline, remains on aerosolized trach collar. SNF -  Continue with tracheostomy care, when necessary nebulizers.   Lower GI bleeding In the setting of heparin drip secondary to bilateral PE. - GI consult appreciated, colonoscopy 1/22 significant for diverticulosis and internal hemorrhoid, no evidence of active GI bleed. - Transfused 2 units PRBC on 1/21. - patient with known history of hysterectomy, as well patient with hematochezia on digital rectal  exam, pelvic ultrasound confirms hysterectomy history, unlikely Vaginal bleed . SNF - follow up CBC  ICH (intracerebral hemorrhage) (HCC) SNF -Continue with supportive care. - Continue with tube feed  Diabetes mellitus (HCC) CBG has been acceptable during hospital stay, insulin regimen changed on discharge,;SNF -  Lantus 10 units subcutaneous daily, to continue with lower insulin sliding scale   HTN (hypertension) SNF - cont  Metoprolol 125 mg BID, clonidine 0.1 mg BID, and amlodipine.10 mg daily  Seizures (HCC) SNF -  stable; cont keppra 500 mg BID   Time spent > 45 min;> 50% of time with patient was spent reviewing records, labs, tests and studies, counseling and developing plan of care  Margit Hanks, MD

## 2015-03-06 ENCOUNTER — Encounter: Payer: Self-pay | Admitting: Internal Medicine

## 2015-03-06 DIAGNOSIS — J9610 Chronic respiratory failure, unspecified whether with hypoxia or hypercapnia: Secondary | ICD-10-CM | POA: Insufficient documentation

## 2015-03-06 NOTE — Assessment & Plan Note (Signed)
In the setting of heparin drip secondary to bilateral PE. - GI consult appreciated, colonoscopy 1/22 significant for diverticulosis and internal hemorrhoid, no evidence of active GI bleed. - Transfused 2 units PRBC on 1/21. - patient with known history of hysterectomy, as well patient with hematochezia on digital rectal exam, pelvic ultrasound confirms hysterectomy history, unlikely Vaginal bleed . SNF - follow up CBC

## 2015-03-06 NOTE — Assessment & Plan Note (Signed)
Presents with fever, tachypnea, tachycardia and leukocytosis  - related to UTI ,  - urine culture growing Escherichia coli and Enterobacter, both of them sensitive to Rocephin. - Blood culture growing Staphylococcus coag negative, most likely contaminant,  - Initially on broad-spectrum antibiotics vancomycin and Zosyn, IV vancomycin stopped 1/21, transitioned Zosyn to Rocephin 1/22 >1/24, no further antibiotics on discharge.  - physiology of sepsis resolved

## 2015-03-06 NOTE — Assessment & Plan Note (Signed)
SNF - cont  Metoprolol 125 mg BID, clonidine 0.1 mg BID, and amlodipine.10 mg daily

## 2015-03-06 NOTE — Assessment & Plan Note (Signed)
CBG has been acceptable during hospital stay, insulin regimen changed on discharge,;SNF -  Lantus 10 units subcutaneous daily, to continue with lower insulin sliding scale

## 2015-03-06 NOTE — Assessment & Plan Note (Signed)
SNF -Continue with supportive care. - Continue with tube feed

## 2015-03-06 NOTE — Assessment & Plan Note (Signed)
Oxygen demand at baseline, remains on aerosolized trach collar. SNF -  Continue with tracheostomy care, when necessary nebulizers.

## 2015-03-06 NOTE — Assessment & Plan Note (Signed)
SNF - stable; cont keppra 500 mg BID

## 2015-03-06 NOTE — Assessment & Plan Note (Signed)
CTA chest significant for extensive bilateral pulmonary embolism extending into lobar, segmental and subsegmental sinus branches throughout the lungs bilaterally. - Patient with recent intracranial hemorrhage, this was discussed with her primary neurosurgeon Dr. 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 - This was discussed with patient sister and son , explained risks versus benefits, and they are agreeable to plan. - Venous Doppler significant for left lower extremity DVT venous Doppler right - Resumed on heparin GTT status post colonoscopy, hemoglobin remained stable, no evidence of recurrent bleed . - long Term anticoagulation plan is with warfarin as it easily reversible, will bridge with Lovenox as INR is therapeutic. SNF-INR to be monitored daily; once INR is therapeutic , then discontinue Lovenox;warfarin will be actively titrated

## 2015-03-11 LAB — POCT INR: INR: 1.7 — AB (ref 0.9–1.1)

## 2015-03-11 LAB — PROTIME-INR: Protime: 19.5 seconds — AB (ref 10.0–13.8)

## 2015-03-14 ENCOUNTER — Inpatient Hospital Stay (HOSPITAL_COMMUNITY)
Admission: EM | Admit: 2015-03-14 | Discharge: 2015-03-22 | DRG: 206 | Disposition: A | Discharge status: Skilled Nursing Facility | Payer: Medicaid Other | Attending: Pulmonary Disease | Admitting: Pulmonary Disease

## 2015-03-14 ENCOUNTER — Encounter (HOSPITAL_COMMUNITY): Payer: Self-pay | Admitting: Emergency Medicine

## 2015-03-14 DIAGNOSIS — Z79891 Long term (current) use of opiate analgesic: Secondary | ICD-10-CM

## 2015-03-14 DIAGNOSIS — Z7401 Bed confinement status: Secondary | ICD-10-CM

## 2015-03-14 DIAGNOSIS — Z79899 Other long term (current) drug therapy: Secondary | ICD-10-CM

## 2015-03-14 DIAGNOSIS — Z86711 Personal history of pulmonary embolism: Secondary | ICD-10-CM

## 2015-03-14 DIAGNOSIS — E46 Unspecified protein-calorie malnutrition: Secondary | ICD-10-CM | POA: Diagnosis present

## 2015-03-14 DIAGNOSIS — N390 Urinary tract infection, site not specified: Secondary | ICD-10-CM | POA: Insufficient documentation

## 2015-03-14 DIAGNOSIS — E119 Type 2 diabetes mellitus without complications: Secondary | ICD-10-CM | POA: Diagnosis present

## 2015-03-14 DIAGNOSIS — Z7901 Long term (current) use of anticoagulants: Secondary | ICD-10-CM

## 2015-03-14 DIAGNOSIS — E1165 Type 2 diabetes mellitus with hyperglycemia: Secondary | ICD-10-CM | POA: Diagnosis present

## 2015-03-14 DIAGNOSIS — Z6823 Body mass index (BMI) 23.0-23.9, adult: Secondary | ICD-10-CM | POA: Diagnosis not present

## 2015-03-14 DIAGNOSIS — B9689 Other specified bacterial agents as the cause of diseases classified elsewhere: Secondary | ICD-10-CM | POA: Diagnosis present

## 2015-03-14 DIAGNOSIS — Z794 Long term (current) use of insulin: Secondary | ICD-10-CM

## 2015-03-14 DIAGNOSIS — Z9981 Dependence on supplemental oxygen: Secondary | ICD-10-CM | POA: Diagnosis not present

## 2015-03-14 DIAGNOSIS — L97419 Non-pressure chronic ulcer of right heel and midfoot with unspecified severity: Secondary | ICD-10-CM | POA: Diagnosis present

## 2015-03-14 DIAGNOSIS — D649 Anemia, unspecified: Secondary | ICD-10-CM | POA: Diagnosis present

## 2015-03-14 DIAGNOSIS — Z87891 Personal history of nicotine dependence: Secondary | ICD-10-CM | POA: Diagnosis not present

## 2015-03-14 DIAGNOSIS — J9509 Other tracheostomy complication: Principal | ICD-10-CM | POA: Diagnosis present

## 2015-03-14 DIAGNOSIS — Z86718 Personal history of other venous thrombosis and embolism: Secondary | ICD-10-CM

## 2015-03-14 DIAGNOSIS — I69298 Other sequelae of other nontraumatic intracranial hemorrhage: Secondary | ICD-10-CM | POA: Diagnosis not present

## 2015-03-14 DIAGNOSIS — R400 Somnolence: Secondary | ICD-10-CM | POA: Insufficient documentation

## 2015-03-14 DIAGNOSIS — R403 Persistent vegetative state: Secondary | ICD-10-CM | POA: Diagnosis present

## 2015-03-14 DIAGNOSIS — B9620 Unspecified Escherichia coli [E. coli] as the cause of diseases classified elsewhere: Secondary | ICD-10-CM | POA: Diagnosis present

## 2015-03-14 DIAGNOSIS — G4710 Hypersomnia, unspecified: Secondary | ICD-10-CM | POA: Diagnosis not present

## 2015-03-14 DIAGNOSIS — E11621 Type 2 diabetes mellitus with foot ulcer: Secondary | ICD-10-CM | POA: Diagnosis present

## 2015-03-14 DIAGNOSIS — R000 Tachycardia, unspecified: Secondary | ICD-10-CM | POA: Diagnosis present

## 2015-03-14 DIAGNOSIS — K219 Gastro-esophageal reflux disease without esophagitis: Secondary | ICD-10-CM | POA: Diagnosis present

## 2015-03-14 DIAGNOSIS — G40909 Epilepsy, unspecified, not intractable, without status epilepticus: Secondary | ICD-10-CM | POA: Diagnosis present

## 2015-03-14 DIAGNOSIS — R1310 Dysphagia, unspecified: Secondary | ICD-10-CM | POA: Diagnosis present

## 2015-03-14 DIAGNOSIS — L89152 Pressure ulcer of sacral region, stage 2: Secondary | ICD-10-CM | POA: Diagnosis present

## 2015-03-14 DIAGNOSIS — Y838 Other surgical procedures as the cause of abnormal reaction of the patient, or of later complication, without mention of misadventure at the time of the procedure: Secondary | ICD-10-CM | POA: Diagnosis present

## 2015-03-14 DIAGNOSIS — G8929 Other chronic pain: Secondary | ICD-10-CM | POA: Diagnosis present

## 2015-03-14 DIAGNOSIS — Z936 Other artificial openings of urinary tract status: Secondary | ICD-10-CM | POA: Diagnosis not present

## 2015-03-14 DIAGNOSIS — A047 Enterocolitis due to Clostridium difficile: Secondary | ICD-10-CM | POA: Diagnosis not present

## 2015-03-14 DIAGNOSIS — E785 Hyperlipidemia, unspecified: Secondary | ICD-10-CM | POA: Diagnosis present

## 2015-03-14 DIAGNOSIS — K589 Irritable bowel syndrome without diarrhea: Secondary | ICD-10-CM | POA: Diagnosis present

## 2015-03-14 DIAGNOSIS — Z931 Gastrostomy status: Secondary | ICD-10-CM

## 2015-03-14 DIAGNOSIS — L8990 Pressure ulcer of unspecified site, unspecified stage: Secondary | ICD-10-CM | POA: Insufficient documentation

## 2015-03-14 DIAGNOSIS — J9500 Unspecified tracheostomy complication: Secondary | ICD-10-CM

## 2015-03-14 DIAGNOSIS — I10 Essential (primary) hypertension: Secondary | ICD-10-CM | POA: Diagnosis present

## 2015-03-14 LAB — CBC WITH DIFFERENTIAL/PLATELET
BASOS ABS: 0 10*3/uL (ref 0.0–0.1)
Basophils Relative: 0 %
EOS PCT: 1 %
Eosinophils Absolute: 0.2 10*3/uL (ref 0.0–0.7)
HEMATOCRIT: 34.2 % — AB (ref 36.0–46.0)
HEMOGLOBIN: 10.8 g/dL — AB (ref 12.0–15.0)
LYMPHS ABS: 2.6 10*3/uL (ref 0.7–4.0)
LYMPHS PCT: 13 %
MCH: 28.5 pg (ref 26.0–34.0)
MCHC: 31.6 g/dL (ref 30.0–36.0)
MCV: 90.2 fL (ref 78.0–100.0)
Monocytes Absolute: 1.8 10*3/uL — ABNORMAL HIGH (ref 0.1–1.0)
Monocytes Relative: 9 %
NEUTROS ABS: 15.4 10*3/uL — AB (ref 1.7–7.7)
NEUTROS PCT: 77 %
PLATELETS: 313 10*3/uL (ref 150–400)
RBC: 3.79 MIL/uL — AB (ref 3.87–5.11)
RDW: 15.7 % — ABNORMAL HIGH (ref 11.5–15.5)
WBC: 20.1 10*3/uL — AB (ref 4.0–10.5)

## 2015-03-14 LAB — COMPREHENSIVE METABOLIC PANEL
ALT: 45 U/L (ref 14–54)
ANION GAP: 8 (ref 5–15)
AST: 24 U/L (ref 15–41)
Albumin: 3.1 g/dL — ABNORMAL LOW (ref 3.5–5.0)
Alkaline Phosphatase: 144 U/L — ABNORMAL HIGH (ref 38–126)
BILIRUBIN TOTAL: 0.7 mg/dL (ref 0.3–1.2)
BUN: 13 mg/dL (ref 6–20)
CHLORIDE: 97 mmol/L — AB (ref 101–111)
CO2: 26 mmol/L (ref 22–32)
Calcium: 9.3 mg/dL (ref 8.9–10.3)
Creatinine, Ser: 0.46 mg/dL (ref 0.44–1.00)
Glucose, Bld: 119 mg/dL — ABNORMAL HIGH (ref 65–99)
POTASSIUM: 4.1 mmol/L (ref 3.5–5.1)
Sodium: 131 mmol/L — ABNORMAL LOW (ref 135–145)
TOTAL PROTEIN: 8.1 g/dL (ref 6.5–8.1)

## 2015-03-14 LAB — BLOOD GAS, ARTERIAL
ACID-BASE EXCESS: 5.2 mmol/L — AB (ref 0.0–2.0)
BICARBONATE: 28 meq/L — AB (ref 20.0–24.0)
Drawn by: 307971
FIO2: 0.21
O2 SAT: 95.8 %
PATIENT TEMPERATURE: 98.6
TCO2: 24.2 mmol/L (ref 0–100)
pCO2 arterial: 35.6 mmHg (ref 35.0–45.0)
pH, Arterial: 7.507 — ABNORMAL HIGH (ref 7.350–7.450)
pO2, Arterial: 78.4 mmHg — ABNORMAL LOW (ref 80.0–100.0)

## 2015-03-14 LAB — PROTIME-INR
INR: 1.51 — AB (ref 0.00–1.49)
PROTHROMBIN TIME: 18.3 s — AB (ref 11.6–15.2)

## 2015-03-14 LAB — APTT: aPTT: 39 seconds — ABNORMAL HIGH (ref 24–37)

## 2015-03-14 MED ORDER — MIDAZOLAM HCL 2 MG/2ML IJ SOLN: 4.0000 mg | Freq: Once | INTRAMUSCULAR | Status: DC

## 2015-03-14 MED ORDER — ETOMIDATE 2 MG/ML IV SOLN: 40.0000 mg | Freq: Once | INTRAVENOUS | Status: DC

## 2015-03-14 MED ORDER — SODIUM CHLORIDE 0.9 % IV SOLN: 250.0000 mL | INTRAVENOUS | Status: DC | PRN

## 2015-03-14 MED ORDER — VECURONIUM BROMIDE 10 MG IV SOLR: 10.0000 mg | Freq: Once | INTRAVENOUS | Status: DC

## 2015-03-14 MED ORDER — PROPOFOL 1000 MG/100ML IV EMUL
30.0000 ug/kg/min | INTRAVENOUS | Status: DC
  Filled 2015-03-14: qty 50

## 2015-03-14 MED ORDER — PROPOFOL 500 MG/50ML IV EMUL: 30.0000 ug/kg/min | INTRAVENOUS | Status: DC

## 2015-03-14 MED ORDER — ONDANSETRON HCL 4 MG/2ML IJ SOLN: 4.0000 mg | Freq: Four times a day (QID) | INTRAMUSCULAR | Status: DC | PRN

## 2015-03-14 MED ORDER — FENTANYL CITRATE (PF) 100 MCG/2ML IJ SOLN: 200.0000 ug | Freq: Once | INTRAMUSCULAR | Status: DC

## 2015-03-14 MED ORDER — ACETAMINOPHEN 325 MG PO TABS
650.0000 mg | ORAL_TABLET | ORAL | Status: DC | PRN
  Administered 2015-03-19: 650 mg via ORAL
  Filled 2015-03-14: qty 2

## 2015-03-14 MED ORDER — DEXTROSE-NACL 5-0.45 % IV SOLN
INTRAVENOUS | Status: DC
  Administered 2015-03-15 (×2): via INTRAVENOUS

## 2015-03-14 NOTE — ED Notes (Addendum)
Respiratory unable to replace trach.  EDP notified and contacted Pulmonary for evaluation.  4mm Uncuffed trach and trach tie located at bedside.

## 2015-03-14 NOTE — ED Notes (Addendum)
Pt from [facility]. Pt pulled out her trachea earlier today. Small amount of bleeding at site. A&O to baseline. Mostly nonverbal. Hx of cerebral hemorrhage. No other complaints. Unable to add EMS vitals in chart - BP 140/92, O2 99% on room air. HR 102

## 2015-03-14 NOTE — Progress Notes (Signed)
CSW attempted to speak with patient at bedside. However, patient was asleep, and there was no family present.  CSW consulted with nurse who states patient has not been effectively comunicative. CSW checked patient's chart which confirms that she is from [place].  Trish Mage [phone] ED CSW 03/14/2015 7:53 PM

## 2015-03-14 NOTE — H&P (Signed)
Name: Rachel Vang MRN: [id number] DOB: 07/12/1965    ADMISSION DATE:  03/14/2015   CHIEF COMPLAINT:  Pt's shiley tracheostomy fell off.   HISTORY OF PRESENT ILLNESS:    Rachel Vang is a 51 y.o. female, with past medical history of hypertension, recent lengthy hospitalization in 12/2014, secondary to intracranial hemorrhage, status post tracheostomy, PEG tube and chronic Foley, discharged on 02/14/2015 to SNF. Pt was admitted in 01/2015 for resp failure 2 to saddle embolus/ LLE DVT. She was discharged on 02/22/15 to SNF.  Patient was brought in to ER today  with a tracheostomy tube became out. Reportedly found to the nursing home without today. 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  I spoke with the son -- he states she was her usual self. No recent signs or sx of infxn. Pt has a PEG tube as well. Apparently, pt pulled out her trache tube today.   At the ER, her sats were 98-99% on RA. Her stoma was very small. She had a 4 Shiley. We tried to probe the stoma but could not insert any finger. She was protecting her airway, moving chest symmetrically.   She is on coumadin but no labs yet at the time of examination.  Pt is in a vegetative state so limited history. I reviewed chart as well as talking with pt's son.     PAST MEDICAL HISTORY :   has a past medical history of Hypertension; Acute respiratory failure (HCC); Epilepsy (HCC); GERD (gastroesophageal reflux disease); Nontraumatic subarachnoid hemorrhage (HCC); Urinary retention; Dysphagia; Hyperlipidemia; IBS (irritable bowel syndrome); and Diabetes mellitus without complication (HCC).  has past surgical history that includes Radiology with anesthesia (N/A, 01/13/2015); Esophagogastroduodenoscopy (egd) with propofol (N/A, 02/02/2015); PEG placement (N/A, 02/02/2015); Abdominal surgery; Aneurysm coiling; Tracheostomy; and Colonoscopy (N/A, 02/20/2015). Prior to Admission medications   Medication Sig Start  Date End Date Taking? Authorizing Provider  Amino Acids-Protein Hydrolys (FEEDING SUPPLEMENT, PRO-STAT SUGAR FREE 64,) LIQD Place 30 mLs into feeding tube 2 (two) times daily. 02/11/15  Yes Leroy Sea, MD  amLODipine (NORVASC) 10 MG tablet Take 1 tablet (10 mg total) by mouth daily. Patient taking differently: Place 10 mg into feeding tube daily.  02/11/15  Yes Leroy Sea, MD  bethanechol (URECHOLINE) 10 MG tablet Take 1 tablet (10 mg total) by mouth every 8 (eight) hours. Patient taking differently: Place 10 mg into feeding tube every 8 (eight) hours.  02/11/15  Yes Leroy Sea, MD  cloNIDine (CATAPRES) 0.1 MG tablet Take 1 tablet (0.1 mg total) by mouth 2 (two) times daily. Patient taking differently: Place 0.1 mg into feeding tube every 8 (eight) hours.  02/22/15  Yes Leana Roe Elgergawy, MD  enoxaparin (LOVENOX) 100 MG/ML injection Inject 1 mL (100 mg total) into the skin daily. Lovenox  to be stopped when INR is equal or more than 2, as patient is on warfarin. 02/23/15  Yes Starleen Arms, MD  fentaNYL (DURAGESIC - DOSED MCG/HR) 25 MCG/HR patch Place 1 patch (25 mcg total) onto the skin every 3 (three) days. 02/22/15  Yes Leana Roe Elgergawy, MD  insulin aspart (NOVOLOG) 100 UNIT/ML injection Inject 0-9 Units into the skin every 4 (four) hours. Patient taking differently: Inject 5 Units into the skin every 4 (four) hours. For cbg >=150 02/22/15  Yes Leana Roe Elgergawy, MD  insulin glargine (LANTUS) 100 UNIT/ML injection Inject 0.1 mLs (10 Units total) into the skin daily. 02/22/15  Yes Dawood S Elgergawy,  MD  levETIRAcetam (KEPPRA) 100 MG/ML solution Place 5 mLs (500 mg total) into feeding tube 2 (two) times daily. 02/11/15  Yes Leroy Sea, MD  metoprolol tartrate (LOPRESSOR) 25 MG tablet Take 5 tablets (125 mg total) by mouth 2 (two) times daily. Patient taking differently: Place 125 mg into feeding tube 2 (two) times daily.  02/11/15  Yes Leroy Sea, MD  warfarin  (COUMADIN) 4 MG tablet Take 8 mg by mouth daily at 6 PM.   Yes Historical Provider, MD  hydrocortisone (ANUSOL-HC) 2.5 % rectal cream Place rectally 2 (two) times daily. For 3 days then stop Patient not taking: Reported on 03/14/2015 02/22/15   Leana Roe Elgergawy, MD  Nutritional Supplements (FEEDING SUPPLEMENT, JEVITY 1.2 CAL,) LIQD Place 1,000 mLs into feeding tube continuous. Patient not taking: Reported on 03/14/2015 02/11/15   Leroy Sea, MD  warfarin (COUMADIN) 5 MG tablet Please give 5 mg at 6 PM, patient INR to be monitored daily,  and dose to be adjusted keep INR value 2-3. Patient not taking: Reported on 03/14/2015 02/22/15   Starleen Arms, MD   No Known Allergies  FAMILY HISTORY:  family history is not on file.  Son denied any family history.   SOCIAL HISTORY:  reports that she has quit smoking. She has never used smokeless tobacco. She reports that she does not drink alcohol.  Limited as pt is non communicative.   REVIEW OF SYSTEMS:   Limited as pt is non communicative. (-) recent cough, SOB, fever, chills, bleeding.  Tolerating TF.  Rest of ROS (-) per son.   Constitutional: Negative for fever, chills, weight loss, malaise/fatigue and diaphoresis.  HENT: Negative for hearing loss, ear pain, nosebleeds, congestion, sore throat, neck pain, tinnitus and ear discharge.   Eyes: Negative for blurred vision, double vision, photophobia, pain, discharge and redness.  Respiratory: Negative for cough, hemoptysis, sputum production, shortness of breath, wheezing and stridor.   Cardiovascular: Negative for chest pain, palpitations, orthopnea, claudication, leg swelling and PND.  Gastrointestinal: Negative for heartburn, nausea, vomiting, abdominal pain, diarrhea, constipation, blood in stool and melena.  Genitourinary: Negative for dysuria, urgency, frequency, hematuria and flank pain.  Musculoskeletal: Negative for myalgias, back pain, joint pain and falls.  Skin: Negative for  itching and rash.  Neurological: Negative for dizziness, tingling, tremors, sensory change, speech change, focal weakness, seizures, loss of consciousness, weakness and headaches.  Endo/Heme/Allergies: Negative for environmental allergies and polydipsia. Does not bruise/bleed easily.  SUBJECTIVE:   VITAL SIGNS: Temp:  [98.9 F (37.2 C)] 98.9 F (37.2 C) (02/13 1717) Pulse Rate:  [90-97] 90 (02/13 1717) Resp:  [14-18] 18 (02/13 1717) BP: (126-138)/(90-94) 138/94 mmHg (02/13 1717) SpO2:  [98 %-99 %] 98 % (02/13 1717) Weight:  [154 lb (69.854 kg)] 154 lb (69.854 kg) (02/13 1753)  PHYSICAL EXAMINATION: Vitals:  Filed Vitals:   03/14/15 1603 03/14/15 1610 03/14/15 1717 03/14/15 1753  BP:  126/90 138/94   Pulse:  97 90   Temp:   98.9 F (37.2 C)   TempSrc:   Oral   Resp:  14 18   Weight:    154 lb (69.854 kg)  SpO2: 99% 99% 98%     Constitutional/General:  Pleasant, well-nourished, well-developed, not in any distress,  Comfortably seating.  Well kempt  Body mass index is 25.63 kg/(m^2). Wt Readings from Last 3 Encounters:  03/14/15 154 lb (69.854 kg)  02/23/15 154 lb (69.854 kg)  02/17/15 152 lb 5.4 oz (69.1 kg)  Neck circumference:   HEENT: Pupils equal and reactive to light and accommodation. Anicteric sclerae. Normal nasal mucosa.   No oral  lesions,  mouth clear,  oropharynx clear, no postnasal drip. (-) Oral thrush. No dental caries.  Airway - Mallampati class III Preferential gaze to left.   Neck: (+) small stoma -- some old blood. Not actively bleeding. Midline trachea. No JVD, (-) LAD. (-) bruits appreciated.  Respiratory/Chest: Grossly normal chest. (-) deformity. (-) Accessory muscle use.  Symmetric expansion. (-) Tenderness on palpation.  Resonant on percussion.  Diminished BS on both lower lung zones. (-) wheezing, rhonchi (-) egophony Bibasilar crackles.   Cardiovascular: Regular rate and  rhythm, heart sounds normal, no murmur or gallops,    Gastrointestinal:  Normal bowel sounds. Soft, non-tender. No hepatosplenomegaly.  (-) masses.   Musculoskeletal:  Normal muscle tone. Normal gait.   Extremities: Grossly normal. (-) clubbing, cyanosis.  (-) edema  Skin: (-) rash,lesions seen.   Neurological/Psychiatric : limited as pt is non communicative.    Recent Labs Lab 03/14/15 1750  NA 131*  K 4.1  CL 97*  CO2 26  BUN 13  CREATININE 0.46  GLUCOSE 119*    Recent Labs Lab 03/14/15 1750  HGB 10.8*  HCT 34.2*  WBC 20.1*  PLT 313   No results found.  ASSESSMENT / PLAN: 54F, chronically bed bound (vegetative state) 2 to IC bleed (12/2014), with complicated hospitalization in 12/2014. Pt also with PE and LLL DVT in 01/2015 admission.  Pt has a trache at a SNF and she pulled it out today. Also, has a PEG. No signs and sx of infections.  Apparently, pt was O2 dependent at SNF. At the ED, her O2 sats were 97-99 % on RA. Not sure if she has OSA.  A> S/P Tracheostomy for prolonged intubation/vegetative state/IC bleed (12/2014)       S/P Accidental removal of trache tube (03/14/15) by pt       PE, LLL DVT 91/2017)       Not sure if with OSA.       S/P PEG (12/2014)       Deconditioning, bed bound   P> 1. I extensively d/w son [name] re: over all condition of pt. He makes decision together with his brother. He wants to see how she does w/o trache.  If she decompensates, he is OK with doing trache.  I think, given the fact that pt is a full code, plan to go ahead with the trache tomorrow. I spoke with Dr. Oswaldo Conroy -- plan to do it tomorrow. Pt may have a difficult airway and son knows the risks and cx with procedure.  2. Check INR. Needs correction if > 1.5 3. Cont other meds. 4. Keep NPO after MN. 5. No signs or sx of infxn. Will hold off on abx for now.  6. Admit to SD to better observe and monitor airway. 7. Check abg -- see if she retains co2.    Lance Sell A. Christene Slates, MD Pulmonary and Critical Care  Medicine [facility] Pager [phone] [phone], Fax: [phone]       Pulmonary and Critical Care Medicine [facility] Pager: [phone]  03/14/2015, 6:32 PM

## 2015-03-14 NOTE — ED Notes (Signed)
Triage vitals just put in.

## 2015-03-14 NOTE — Progress Notes (Signed)
Called to replace trach . Upon arrival to room patient breathing with O2 sats 96% on room air. Inspected stoma and found stoma had already closed. CCm entered room and confirmed stoma closure. CCM asked for a 2lpm Spencer and observe patient for awhile while they held family conference.

## 2015-03-14 NOTE — ED Provider Notes (Signed)
CSN: [id number]     Arrival date & time 03/14/15  1547 History   First MD Initiated Contact with Patient 03/14/15 1616     Chief Complaint  Patient presents with  . Tracheostomy Tube Change     The history is provided by the patient.   Patient presents with a tracheostomy tube became out. Reportedly found to the nursing home without today. She is reportedly in a vegetative state after a intracranial hemorrhage.    Past Medical History  Diagnosis Date  . Hypertension   . Acute respiratory failure (HCC)   . Epilepsy (HCC)   . GERD (gastroesophageal reflux disease)   . Nontraumatic subarachnoid hemorrhage (HCC)   . Urinary retention   . Dysphagia   . Hyperlipidemia   . IBS (irritable bowel syndrome)   . Diabetes mellitus without complication (HCC)     Type 2, W/o complications   Past Surgical History  Procedure Laterality Date  . Radiology with anesthesia N/A 01/13/2015    Procedure: RADIOLOGY WITH ANESTHESIA;  Surgeon: Lisbeth Renshaw, MD;  Location[facility] OR;  Service: Radiology;  Laterality: N/A;  . Esophagogastroduodenoscopy (egd) with propofol N/A 02/02/2015    Procedure: ESOPHAGOGASTRODUODENOSCOPY (EGD) WITH PROPOFOL;  Surgeon: Jimmye Norman, MD;  Location[facility] ENDOSCOPY;  Service: General;  Laterality: N/A;  . Peg placement N/A 02/02/2015    Procedure: PERCUTANEOUS ENDOSCOPIC GASTROSTOMY (PEG) PLACEMENT;  Surgeon: Jimmye Norman, MD;  Location[facility] ENDOSCOPY;  Service: General;  Laterality: N/A;  . Abdominal surgery    . Aneurysm coiling    . Tracheostomy    . Colonoscopy N/A 02/20/2015    Procedure: COLONOSCOPY;  Surgeon: Iva Boop, MD;  Location: [facility] ENDOSCOPY;  Service: Endoscopy;  Laterality: N/A;   No family history on file. Social History  Substance Use Topics  . Smoking status: Former Games developer  . Smokeless tobacco: Never Used  . Alcohol Use: No   OB History    No data available     Review of Systems  Unable to perform ROS: Patient nonverbal      Allergies   Review of patient's allergies indicates no known allergies.  Home Medications   Prior to Admission medications   Medication Sig Start Date End Date Taking? Authorizing Provider  Amino Acids-Protein Hydrolys (FEEDING SUPPLEMENT, PRO-STAT SUGAR FREE 64,) LIQD Place 30 mLs into feeding tube 2 (two) times daily. 02/11/15  Yes Leroy Sea, MD  amLODipine (NORVASC) 10 MG tablet Take 1 tablet (10 mg total) by mouth daily. Patient taking differently: Place 10 mg into feeding tube daily.  02/11/15  Yes Leroy Sea, MD  bethanechol (URECHOLINE) 10 MG tablet Take 1 tablet (10 mg total) by mouth every 8 (eight) hours. Patient taking differently: Place 10 mg into feeding tube every 8 (eight) hours.  02/11/15  Yes Leroy Sea, MD  cloNIDine (CATAPRES) 0.1 MG tablet Take 1 tablet (0.1 mg total) by mouth 2 (two) times daily. Patient taking differently: Place 0.1 mg into feeding tube every 8 (eight) hours.  02/22/15  Yes Leana Roe Elgergawy, MD  enoxaparin (LOVENOX) 100 MG/ML injection Inject 1 mL (100 mg total) into the skin daily. Lovenox  to be stopped when INR is equal or more than 2, as patient is on warfarin. 02/23/15  Yes Starleen Arms, MD  fentaNYL (DURAGESIC - DOSED MCG/HR) 25 MCG/HR patch Place 1 patch (25 mcg total) onto the skin every 3 (three) days. 02/22/15  Yes Leana Roe Elgergawy, MD  insulin aspart (NOVOLOG) 100 UNIT/ML  injection Inject 0-9 Units into the skin every 4 (four) hours. Patient taking differently: Inject 5 Units into the skin every 4 (four) hours. For cbg >=150 02/22/15  Yes Leana Roe Elgergawy, MD  insulin glargine (LANTUS) 100 UNIT/ML injection Inject 0.1 mLs (10 Units total) into the skin daily. 02/22/15  Yes Starleen Arms, MD  levETIRAcetam (KEPPRA) 100 MG/ML solution Place 5 mLs (500 mg total) into feeding tube 2 (two) times daily. 02/11/15  Yes Leroy Sea, MD  metoprolol tartrate (LOPRESSOR) 25 MG tablet Take 5 tablets (125 mg total) by mouth 2 (two) times  daily. Patient taking differently: Place 125 mg into feeding tube 2 (two) times daily.  02/11/15  Yes Leroy Sea, MD  warfarin (COUMADIN) 4 MG tablet Take 8 mg by mouth daily at 6 PM.   Yes Historical Provider, MD  hydrocortisone (ANUSOL-HC) 2.5 % rectal cream Place rectally 2 (two) times daily. For 3 days then stop Patient not taking: Reported on 03/14/2015 02/22/15   Leana Roe Elgergawy, MD  Nutritional Supplements (FEEDING SUPPLEMENT, JEVITY 1.2 CAL,) LIQD Place 1,000 mLs into feeding tube continuous. Patient not taking: Reported on 03/14/2015 02/11/15   Leroy Sea, MD  warfarin (COUMADIN) 5 MG tablet Please give 5 mg at 6 PM, patient INR to be monitored daily,  and dose to be adjusted keep INR value 2-3. Patient not taking: Reported on 03/14/2015 02/22/15   Leana Roe Elgergawy, MD   BP 131/94 mmHg  Pulse 124  Temp(Src) 98.9 F (37.2 C) (Oral)  Resp 26  Wt 154 lb (69.854 kg)  SpO2 97%  LMP  (LMP Unknown) Physical Exam  Constitutional: She appears well-developed.  Neck:  Tracheostomy hole on neck.  no air escaping from the neck.  Cardiovascular: Normal rate.   Pulmonary/Chest: Effort normal.  Abdominal: There is no tenderness.  Neurological:  Patient was able to answer some questions.   Skin: Skin is warm.    ED Course  Procedures (including critical care time) Labs Review Labs Reviewed  BLOOD GAS, ARTERIAL - Abnormal; Notable for the following:    pH, Arterial 7.507 (*)    pO2, Arterial 78.4 (*)    Bicarbonate 28.0 (*)    Acid-Base Excess 5.2 (*)    All other components within normal limits  COMPREHENSIVE METABOLIC PANEL - Abnormal; Notable for the following:    Sodium 131 (*)    Chloride 97 (*)    Glucose, Bld 119 (*)    Albumin 3.1 (*)    Alkaline Phosphatase 144 (*)    All other components within normal limits  CBC WITH DIFFERENTIAL/PLATELET - Abnormal; Notable for the following:    WBC 20.1 (*)    RBC 3.79 (*)    Hemoglobin 10.8 (*)    HCT 34.2 (*)    RDW  15.7 (*)    Neutro Abs 15.4 (*)    Monocytes Absolute 1.8 (*)    All other components within normal limits  PROTIME-INR - Abnormal; Notable for the following:    Prothrombin Time 18.3 (*)    INR 1.51 (*)    All other components within normal limits  APTT - Abnormal; Notable for the following:    aPTT 39 (*)    All other components within normal limits  CBC  BASIC METABOLIC PANEL    Imaging Review No results found. I have personally reviewed and evaluated these images and lab results as part of my medical decision-making.   EKG Interpretation None  MDM   Final diagnoses:  Tracheostomy complication, unspecified complication type [facility])    Patient had her tracheostomy come out. Unable to replace. Has been admitted by pulmonary for replacement.    Benjiman Core, MD 03/15/15 [phone]

## 2015-03-15 ENCOUNTER — Encounter (HOSPITAL_COMMUNITY): Payer: Self-pay

## 2015-03-15 ENCOUNTER — Inpatient Hospital Stay (HOSPITAL_COMMUNITY): Payer: Medicaid Other

## 2015-03-15 DIAGNOSIS — I2699 Other pulmonary embolism without acute cor pulmonale: Secondary | ICD-10-CM

## 2015-03-15 DIAGNOSIS — J9500 Unspecified tracheostomy complication: Secondary | ICD-10-CM

## 2015-03-15 DIAGNOSIS — L8990 Pressure ulcer of unspecified site, unspecified stage: Secondary | ICD-10-CM | POA: Insufficient documentation

## 2015-03-15 LAB — BASIC METABOLIC PANEL
Anion gap: 11 (ref 5–15)
BUN: 14 mg/dL (ref 6–20)
CALCIUM: 9.8 mg/dL (ref 8.9–10.3)
CO2: 27 mmol/L (ref 22–32)
CREATININE: 0.5 mg/dL (ref 0.44–1.00)
Chloride: 97 mmol/L — ABNORMAL LOW (ref 101–111)
GFR calc non Af Amer: >60 mL/min (ref >60)
Glucose, Bld: 166 mg/dL — ABNORMAL HIGH (ref 65–99)
Potassium: 4.2 mmol/L (ref 3.5–5.1)
SODIUM: 135 mmol/L (ref 135–145)

## 2015-03-15 LAB — MRSA PCR SCREENING: MRSA by PCR: NEGATIVE

## 2015-03-15 LAB — CBC
HEMATOCRIT: 39.4 % (ref 36.0–46.0)
Hemoglobin: 12.1 g/dL (ref 12.0–15.0)
MCH: 28.7 pg (ref 26.0–34.0)
MCHC: 30.7 g/dL (ref 30.0–36.0)
MCV: 93.4 fL (ref 78.0–100.0)
Platelets: 393 10*3/uL (ref 150–400)
RBC: 4.22 MIL/uL (ref 3.87–5.11)
RDW: 16.2 % — AB (ref 11.5–15.5)
WBC: 21.2 10*3/uL — ABNORMAL HIGH (ref 4.0–10.5)

## 2015-03-15 MED ORDER — CHLORHEXIDINE GLUCONATE 0.12 % MT SOLN
15.0000 mL | Freq: Two times a day (BID) | OROMUCOSAL | Status: DC
  Administered 2015-03-15 – 2015-03-22 (×14): 15 mL via OROMUCOSAL
  Filled 2015-03-15 (×13): qty 15

## 2015-03-15 MED ORDER — AMLODIPINE BESYLATE 10 MG PO TABS
10.0000 mg | ORAL_TABLET | Freq: Every day | ORAL | Status: DC
  Administered 2015-03-15 – 2015-03-22 (×8): 10 mg
  Filled 2015-03-15 (×8): qty 1

## 2015-03-15 MED ORDER — WARFARIN - PHARMACIST DOSING INPATIENT
Freq: Every day | Status: DC
  Administered 2015-03-21: 1

## 2015-03-15 MED ORDER — LEVETIRACETAM 100 MG/ML PO SOLN
500.0000 mg | Freq: Two times a day (BID) | ORAL | Status: DC
  Administered 2015-03-15 – 2015-03-22 (×14): 500 mg
  Filled 2015-03-15 (×15): qty 5

## 2015-03-15 MED ORDER — VITAL HIGH PROTEIN PO LIQD
1000.0000 mL | ORAL | Status: DC
  Filled 2015-03-15: qty 1000

## 2015-03-15 MED ORDER — JEVITY 1.2 CAL PO LIQD
1000.0000 mL | ORAL | Status: DC
  Administered 2015-03-15 – 2015-03-22 (×8): 1000 mL
  Filled 2015-03-15 (×9): qty 1000

## 2015-03-15 MED ORDER — WARFARIN SODIUM 4 MG PO TABS
8.0000 mg | ORAL_TABLET | Freq: Once | ORAL | Status: AC
  Administered 2015-03-15: 8 mg via ORAL
  Filled 2015-03-15: qty 2

## 2015-03-15 MED ORDER — BETHANECHOL CHLORIDE 10 MG PO TABS
10.0000 mg | ORAL_TABLET | Freq: Three times a day (TID) | ORAL | Status: DC
  Administered 2015-03-15 – 2015-03-22 (×21): 10 mg
  Filled 2015-03-15 (×25): qty 1

## 2015-03-15 MED ORDER — CLONIDINE HCL 0.1 MG PO TABS
0.1000 mg | ORAL_TABLET | Freq: Three times a day (TID) | ORAL | Status: DC
  Administered 2015-03-15 – 2015-03-22 (×21): 0.1 mg
  Filled 2015-03-15 (×24): qty 1

## 2015-03-15 MED ORDER — METOPROLOL TARTRATE 25 MG PO TABS: 125.0000 mg | ORAL_TABLET | Freq: Two times a day (BID) | ORAL | Status: DC

## 2015-03-15 MED ORDER — METOPROLOL TARTRATE 25 MG/10 ML ORAL SUSPENSION
125.0000 mg | Freq: Two times a day (BID) | ORAL | Status: DC
  Administered 2015-03-15 – 2015-03-21 (×13): 125 mg
  Filled 2015-03-15 (×18): qty 60

## 2015-03-15 MED ORDER — PRO-STAT SUGAR FREE PO LIQD: 30.0000 mL | Freq: Two times a day (BID) | ORAL | Status: DC

## 2015-03-15 MED ORDER — CETYLPYRIDINIUM CHLORIDE 0.05 % MT LIQD
7.0000 mL | Freq: Two times a day (BID) | OROMUCOSAL | Status: DC
  Administered 2015-03-16 – 2015-03-21 (×11): 7 mL via OROMUCOSAL

## 2015-03-15 NOTE — Progress Notes (Signed)
Name: Rachel Vang MRN: [id number] DOB: December 26, 1965    ADMISSION DATE:  03/14/2015   CHIEF COMPLAINT:  Tracheostomy dislodgement    SUBJECTIVE: RN reports no acute events.  Patient respiratory status stable on room air.    VITAL SIGNS: Temp:  [98.9 F (37.2 C)-100 F (37.8 C)] 100 F (37.8 C) (02/14 0403) Pulse Rate:  [90-124] 119 (02/14 0403) Resp:  [14-29] 14 (02/14 0403) BP: (117-156)/(86-99) 142/88 mmHg (02/14 0403) SpO2:  [95 %-99 %] 97 % (02/14 0403) Weight:  [143 lb 15.4 oz (65.3 kg)-154 lb (69.854 kg)] 143 lb 15.4 oz (65.3 kg) (02/14 0232)  PHYSICAL EXAMINATION: General: chronically ill appearing female in NAD Neuro: awake, smiles when spoken to, nods yes/no to questions, reached up and grabbed my badge, verbalized "alright" when I said I would see her later CV: s1s2 rrr, no m/r/g PULM: even/non-labored, lungs bilaterally clear, moving good air, no wheezing, able to phonate intermittently GI: obese, soft, bsx4 active  Extremities: warm/dry, no edema     Recent Labs Lab 03/14/15 1750 03/15/15 0328  NA 131* 135  K 4.1 4.2  CL 97* 97*  CO2 26 27  BUN 13 14  CREATININE 0.46 0.50  GLUCOSE 119* 166*    Recent Labs Lab 03/14/15 1750 03/15/15 0328  HGB 10.8* 12.1  HCT 34.2* 39.4  WBC 20.1* 21.2*  PLT 313 393   Dg Chest Port 1 View  03/15/2015  CLINICAL DATA:  Tracheostomy dislodgement. EXAM: PORTABLE CHEST 1 VIEW COMPARISON:  02/20/2015 FINDINGS: The tracheostomy tube is currently absent.  No endotracheal tube. Linear subsegmental atelectasis at the left lung base. Mild enlargement of the cardiopericardial silhouette, without edema. No pneumomediastinum are pneumothorax. IMPRESSION: 1. Tracheostomy tube is newly absent. No pneumomediastinum or pneumothorax. 2. Stable mild enlargement of the cardiopericardial silhouette. 3. Linear subsegmental atelectasis in the left lower lobe. Electronically Signed   By: Gaylyn Rong M.D.   On: 03/15/2015 07:27     ASSESSMENT / PLAN:  DISCUSSION:  45F, chronically bed bound secondary to IC bleed (12/2014), with complicated hospitalization in 12/2014. Pt also with PE and LLL DVT in 01/2015 admission on coumadin.  Discharged with a trach to SNF and she pulled it out 2/14. Also, has a PEG. No signs and sx of infections.   Apparently, pt was O2 dependent at SNF. At the ED, her O2 sats were 97-99 % on RA. Unclear if she has OSA.    S/P Tracheostomy for prolonged intubation / IC bleed (12/2014) S/P Accidental removal of trach (03/14/15) by patient Unclear if baseline OSA  Plan: [name] out since 2/13 without any distress, remains on room air.  No secretions.  Further discussion with family and they agree to hold off on replacement of tracheostomy at this time.  If she were to decompensate, the family would be ok with replacement in the future. Pulmonary hygiene as able - upright positioning, mobilize Aspiration precautions  Keep trach site covered until completely closed   PE, LLL DVT (01/2015) on Coumadin   Plan: Resume Coumadin per pharmacy 2/14  Trend INR    S/P PEG (12/2014) Protein Calorie Malnutrition   Plan: Resume TF  Nutrition consult  PEG care per protocol   Deconditioning, bed bound Hx CVA / ICH  Plan: Chair position as tolerated  Continue home keppra    HTN   Plan: Resume home norvasc, clonidine, metoprolol   GLOBAL:  Updated son Rachel Vang) on plan of care.  He is in agreement with plan and  understands that in the future she could need support but at this point does not.  Hold trach placement for now.  Attempted to call Rachel Vang as well, left message. If she continues to do well, may be able to discharge back to SNF tomorrow 2/15.     Rachel Brim, NP-C [facility] Pgr: [phone] or if no answer [phone] 03/15/2015, 11:51 AM  PCCM Attending Note: Patient seen and examined with nurse practitioner. Please refer to her progress note which I reviewed  in detail. Status post tracheal dislodgment. Patient reportedly verbalizing earlier. Appears comfortable on room air with no signs of distress.   BP 113/79 mmHg  Pulse 137  Temp(Src) 98.7 F (37.1 C) (Axillary)  Resp 25  Ht  (1.651 m)  Wt 143 lb 15.4 oz (65.3 kg)  BMI 23.96 kg/m2  SpO2 99%  LMP  (LMP Unknown) Gen.: Laying in bed. Head turned to left side. No distress. Pulmonary: Normal work of breathing on room air. Symmetric chest rise. Neurological: Patient tracks to voice. Does not verbalize. Spontaneously moving extremity.  CBC Latest Ref Rng 03/15/2015 03/14/2015 02/22/2015  WBC 4.0 - 10.5 K/uL 21.2(H) 20.1(H) 10.4  Hemoglobin 12.0 - 15.0 g/dL 14.7 10.8(L) 10.2(L)  Hematocrit 36.0 - 46.0 % 39.4 34.2(L) 32.3(L)  Platelets 150 - 400 K/uL 393 313 76   A/P:  50 year old female status post CVA/ICH and tracheostomy. Status post tracheostomy dislodgment. Doing well. No signs of distress. NP discussed leaving out tracheostomy with patient's son and feel this is reasonable at this time.  1. Status post tracheostomy: Placed for prolonged intubation from intracranial hemorrhage. Plan to leave tracheostomy out for now. With close monitoring. 2. PE/left lower extremity DVT: Resuming home Coumadin per pharmacy protocol. 3. CVA/ICH: Continuing Keppra. 4. Diet: Resuming tube feeds via PEG.  Donna Christen Jamison Neighbor, M.D. [place] Pager:  [phone] After 3pm or if no response, call [phone]

## 2015-03-15 NOTE — Progress Notes (Addendum)
Initial Nutrition Assessment  INTERVENTION:   Initiate Jevity 1.2 @ 25 ml/hr via PEG and increase by 10 ml every 4 hours to goal rate of 65 ml/hr.  D/c Prostat Recommend free water flushes to 100 ml every 4 hours.  Tube feeding regimen provides 1872 kcal (100% of needs), 87 grams of protein, and 1859 ml of H2O.   RD to continue to monitor  NUTRITION DIAGNOSIS:   Inadequate oral intake related to inability to eat as evidenced by NPO status.  GOAL:   Patient will meet greater than or equal to 90% of their needs  MONITOR:   Labs, Weight trends, I & O's, Skin  REASON FOR ASSESSMENT:   Low Braden    ASSESSMENT:   50 y.o. female, with past medical history of hypertension, recent lengthy hospitalization in 12/2014, secondary to intracranial hemorrhage, status post tracheostomy, PEG tube and chronic Foley, discharged on 02/14/2015 to SNF. Pt was admitted in 01/2015 for resp failure 2 to saddle embolus/ LLE DVT  Pt in room being assessed by RT. Unable to speak with patient at this time. Per H&P, pt not very communicative. Per chart review, pt with unstageable wound on heel and stage II sacral ulcers. PEG was placed during previous admission in December. Was recommended to use Jevity 1.2 @ 50 ml/hr with 30 ml Prostat BID. Pt removed tracheostomy. There is a hold on replacing her trach at this time.   Per weight history, pt has lost 16 lb since 1/16 (10% weight loss x 1 month, significant for time frame). Will perform NFPE at follow-up.   Labs reviewed: CBGs: 129-138  Diet Order:  Diet NPO time specified  Skin:  Wound (see comment) (Unstageable heel ulcer, Stage II sacral ulcer)  Last BM:  2/14  Height:   Ht Readings from Last 1 Encounters:  03/15/15  (1.651 m)    Weight:   Wt Readings from Last 1 Encounters:  03/15/15 143 lb 15.4 oz (65.3 kg)    Ideal Body Weight:  56.8 kg  BMI:  Body mass index is 23.96 kg/(m^2).  Estimated Nutritional Needs:   Kcal:   1600-1800  Protein:  85-95g  Fluid:  1.8L/day  EDUCATION NEEDS:   No education needs identified at this time  Tilda Franco, MS, RD, LDN Pager: [phone] After Hours Pager: [phone]

## 2015-03-15 NOTE — Progress Notes (Signed)
Stoma site covered with gauze. Site looks dry, no swelling, dried blood. Patient is tolerating well on room air. No distress at this time. HR 127. BBS clear and diminished in the bases. RT will continue to monitor.

## 2015-03-15 NOTE — Progress Notes (Signed)
ANTICOAGULATION CONSULT NOTE - Initial Consult  Pharmacy Consult for Warfarin Indication: DVT/PE  No Known Allergies  Patient Measurements: Height:  (165.1 cm) Weight: 143 lb 15.4 oz (65.3 kg) IBW/kg (Calculated) : 57  Vital Signs: Temp: 100 F (37.8 C) (02/14 0403) Temp Source: Axillary (02/14 0403) BP: 142/88 mmHg (02/14 0403) Pulse Rate: 119 (02/14 0403)  Labs:  Recent Labs  03/14/15 1750 03/14/15 1907 03/15/15 0328  HGB 10.8*  --  12.1  HCT 34.2*  --  39.4  PLT 313  --  393  APTT  --  39*  --   LABPROT  --  18.3*  --   INR  --  1.51*  --   CREATININE 0.46  --  0.50    Estimated Creatinine Clearance: 74.9 mL/min (by C-G formula based on Cr of 0.5).   Medical History: Past Medical History  Diagnosis Date  . Hypertension   . Acute respiratory failure (HCC)   . Epilepsy (HCC)   . GERD (gastroesophageal reflux disease)   . Nontraumatic subarachnoid hemorrhage (HCC)   . Urinary retention   . Dysphagia   . Hyperlipidemia   . IBS (irritable bowel syndrome)   . Diabetes mellitus without complication (HCC)     Type 2, W/o complications    Medications:  Scheduled:  . amLODipine  10 mg Per Tube Daily  . bethanechol  10 mg Per Tube 3 times per day  . cloNIDine  0.1 mg Per Tube 3 times per day  . etomidate  40 mg Intravenous Once  . feeding supplement (PRO-STAT SUGAR FREE 64)  30 mL Per Tube BID  . fentaNYL (SUBLIMAZE) injection  200 mcg Intravenous Once  . levETIRAcetam  500 mg Per Tube BID  . metoprolol tartrate  125 mg Per Tube BID  . midazolam  4 mg Intravenous Once  . vecuronium  10 mg Intravenous Once   Infusions:  . dextrose 5 % and 0.45% NaCl 50 mL/hr at 03/15/15 0100  . 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  She is s/p tracheostomy, PEG tube and chronic Foley, discharged on 02/14/2015 to SNF. Pt was admitted in 01/2015 for  resp failure 2 to saddle embolus/ LLE DVT for which she was started on warfarin. She was discharged on 02/22/15 to SNF.  She was readmitted 2/14 after pulling out her trach.  Warfarin was initially held until plans were determined for replacing trach.  PTA dose of warfarin reported as  daily, INR subtherapeutic (1.5) on admit.  Since patient is in no distress with trach out, PCCM will hold off on replacement.  Pharmacy has been consulted to resume warfarin dosing.  Goal of Therapy:  INR 2-3   Plan:   Warfarin  PO x 1 today  Daily PT/INR  Monitor closely for signs/symptoms of bleeding  Loralee Pacas, PharmD, BCPS Pager: [phone] 03/15/2015,12:19 PM

## 2015-03-15 NOTE — Care Management Note (Signed)
Case Management Note  Patient Details  Name: Rachel Vang MRN: [id number] Date of Birth: 23-Dec-1965  Subjective/Objective:             Sepsis protocol       Action/Plan:Date: March 15, 2015 Chart reviewed for concurrent status and case management needs. Will continue to follow patient for changes and needs: Marcelle Smiling, BSN, RN, [place]   [phone]   Expected Discharge Date:   (unknown)               Expected Discharge Plan:  Home/Self Care  In-House Referral:  NA  Discharge planning Services  CM Consult  Post Acute Care Choice:  NA Choice offered to:  NA  DME Arranged:    DME Agency:     HH Arranged:    HH Agency:     Status of Service:  Completed, signed off  Medicare Important Message Given:    Date Medicare IM Given:    Medicare IM give by:    Date Additional Medicare IM Given:    Additional Medicare Important Message give by:     If discussed at Long Length of Stay Meetings, dates discussed:    Additional Comments:  Golda Acre, RN 03/15/2015, 9:45 AM

## 2015-03-16 ENCOUNTER — Inpatient Hospital Stay (HOSPITAL_COMMUNITY): Payer: Medicaid Other

## 2015-03-16 DIAGNOSIS — J9509 Other tracheostomy complication: Principal | ICD-10-CM

## 2015-03-16 DIAGNOSIS — N390 Urinary tract infection, site not specified: Secondary | ICD-10-CM

## 2015-03-16 LAB — URINE MICROSCOPIC-ADD ON

## 2015-03-16 LAB — GLUCOSE, CAPILLARY
GLUCOSE-CAPILLARY: 172 mg/dL — AB (ref 65–99)
GLUCOSE-CAPILLARY: 195 mg/dL — AB (ref 65–99)
GLUCOSE-CAPILLARY: 129 mg/dL — AB (ref 65–99)
GLUCOSE-CAPILLARY: 127 mg/dL — AB (ref 65–99)
Glucose-Capillary: 164 mg/dL — ABNORMAL HIGH (ref 65–99)
Glucose-Capillary: 169 mg/dL — ABNORMAL HIGH (ref 65–99)

## 2015-03-16 LAB — BASIC METABOLIC PANEL
Anion gap: 11 (ref 5–15)
BUN: 14 mg/dL (ref 6–20)
CALCIUM: 9.3 mg/dL (ref 8.9–10.3)
CO2: 24 mmol/L (ref 22–32)
CREATININE: 0.51 mg/dL (ref 0.44–1.00)
Chloride: 99 mmol/L — ABNORMAL LOW (ref 101–111)
GFR calc Af Amer: >60 mL/min (ref >60)
Glucose, Bld: 213 mg/dL — ABNORMAL HIGH (ref 65–99)
POTASSIUM: 3.8 mmol/L (ref 3.5–5.1)
SODIUM: 134 mmol/L — AB (ref 135–145)

## 2015-03-16 LAB — URINALYSIS, ROUTINE W REFLEX MICROSCOPIC
BILIRUBIN URINE: NEGATIVE
Glucose, UA: NEGATIVE mg/dL
Ketones, ur: NEGATIVE mg/dL
NITRITE: NEGATIVE
PROTEIN: 100 mg/dL — AB
Specific Gravity, Urine: 1.025 (ref 1.005–1.030)
pH: 5 (ref 5.0–8.0)

## 2015-03-16 LAB — CBC
HCT: 36.8 % (ref 36.0–46.0)
HEMOGLOBIN: 11.3 g/dL — AB (ref 12.0–15.0)
MCH: 28.7 pg (ref 26.0–34.0)
MCHC: 30.7 g/dL (ref 30.0–36.0)
MCV: 93.4 fL (ref 78.0–100.0)
Platelets: 414 10*3/uL — ABNORMAL HIGH (ref 150–400)
RBC: 3.94 MIL/uL (ref 3.87–5.11)
RDW: 16.3 % — ABNORMAL HIGH (ref 11.5–15.5)
WBC: 23.4 10*3/uL — ABNORMAL HIGH (ref 4.0–10.5)

## 2015-03-16 LAB — PROTIME-INR
INR: 1.8 — ABNORMAL HIGH (ref 0.00–1.49)
PROTHROMBIN TIME: 20.8 s — AB (ref 11.6–15.2)

## 2015-03-16 MED ORDER — WARFARIN SODIUM 4 MG PO TABS
8.0000 mg | ORAL_TABLET | Freq: Once | ORAL | Status: AC
  Administered 2015-03-16: 8 mg via ORAL
  Filled 2015-03-16: qty 2

## 2015-03-16 MED ORDER — METOPROLOL TARTRATE 1 MG/ML IV SOLN
2.5000 mg | INTRAVENOUS | Status: DC | PRN
  Administered 2015-03-16: 2.5 mg via INTRAVENOUS
  Filled 2015-03-16 (×2): qty 5

## 2015-03-16 MED ORDER — PIPERACILLIN-TAZOBACTAM 3.375 G IVPB
3.3750 g | Freq: Three times a day (TID) | INTRAVENOUS | Status: DC
  Administered 2015-03-16 – 2015-03-20 (×12): 3.375 g via INTRAVENOUS
  Filled 2015-03-16 (×13): qty 50

## 2015-03-16 MED ORDER — FENTANYL 12 MCG/HR TD PT72
12.5000 ug | MEDICATED_PATCH | TRANSDERMAL | Status: DC
  Administered 2015-03-16: 12.5 ug via TRANSDERMAL
  Filled 2015-03-16 (×2): qty 1

## 2015-03-16 MED ORDER — LACTATED RINGERS IV SOLN
INTRAVENOUS | Status: DC
  Administered 2015-03-16 – 2015-03-19 (×5): via INTRAVENOUS

## 2015-03-16 NOTE — Progress Notes (Signed)
UA concerning for UTI, urine culture pending. WBC rising.  Hold discharge for now.    Plan: Await urine culture Hold discharge 2/15 Begin zosyn IV    Canary Brim, NP-C [facility] Pgr: [phone] or if no answer [phone] 03/16/2015, 12:33 PM

## 2015-03-16 NOTE — Progress Notes (Signed)
ANTICOAGULATION CONSULT NOTE   Pharmacy Consult for Warfarin Indication: DVT/PE  No Known Allergies  Patient Measurements: Height:  (165.1 cm) Weight: 141 lb 8.6 oz (64.2 kg) IBW/kg (Calculated) : 57  Vital Signs: Temp: 98 F (36.7 C) (02/15 0800) Temp Source: Oral (02/15 0002) BP: 109/77 mmHg (02/15 1000) Pulse Rate: 104 (02/15 1000)  Labs:  Recent Labs  03/14/15 1750 03/14/15 1907 03/15/15 0328 03/16/15 0314 03/16/15 0633  HGB 10.8*  --  12.1 11.3*  --   HCT 34.2*  --  39.4 36.8  --   PLT 313  --  393 414*  --   APTT  --  39*  --   --   --   LABPROT  --  18.3*  --  20.8*  --   INR  --  1.51*  --  1.80*  --   CREATININE 0.46  --  0.50  --  0.51    Estimated Creatinine Clearance: 74.9 mL/min (by C-G formula based on Cr of 0.51).   Medications:  Scheduled:  . amLODipine  10 mg Per Tube Daily  . antiseptic oral rinse  7 mL Mouth Rinse q12n4p  . bethanechol  10 mg Per Tube 3 times per day  . chlorhexidine  15 mL Mouth Rinse BID  . cloNIDine  0.1 mg Per Tube 3 times per day  . fentaNYL  12.5 mcg Transdermal Q72H  . levETIRAcetam  500 mg Per Tube BID  . metoprolol tartrate  125 mg Per Tube BID  . Warfarin - Pharmacist Dosing Inpatient   Does not apply q1800   Infusions:  . feeding supplement (JEVITY 1.2 CAL) 1,000 mL (03/16/15 0807)  . 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  She is s/p tracheostomy, PEG tube and chronic Foley, discharged on 02/14/2015 to SNF. Pt was admitted in 01/2015 for resp failure 2 to saddle embolus/ LLE DVT for which she was started on warfarin. She was discharged on 02/22/15 to SNF.  She was readmitted 2/14 after pulling out her trach.  Warfarin was initially held until plans were determined for replacing trach.  PTA dose of warfarin reported as  daily, INR subtherapeutic  (1.5) on admit.  Since patient is in no distress with trach out, PCCM will hold off on replacement.  Pharmacy has been consulted to resume warfarin dosing.  Today, 03/16/2015:  INR subtherapeutic but trending up  CBC: Hgb and Pltc WNL  TF resumed 2/14   No major drug interactions   Goal of Therapy:  INR 2-3   Plan:   Warfarin  PO x 1 today (same as prior to admission dose as reported on SNF MAR)  Daily PT/INR - watch INR trend  Monitor closely for signs/symptoms of bleeding  Juliette Alcide, PharmD, BCPS.   Pager: [phone] 03/16/2015 11:55 AM

## 2015-03-16 NOTE — Progress Notes (Signed)
Inpatient Diabetes Program Recommendations  AACE/ADA: New Consensus Statement on Inpatient Glycemic Control (2015)  Target Ranges:  Prepandial:   less than 140 mg/dL      Peak postprandial:   less than 180 mg/dL (1-2 hours)      Critically ill patients:  140 - 180 mg/dL   Review of Glycemic Control   Results for JALEIA, HANKE (MRN [id number]) as of 03/16/2015 12:19  Ref. Range 03/16/2015 00:01 03/16/2015 03:54 03/16/2015 08:07 03/16/2015 11:54  Glucose-Capillary Latest Ref Range: 65-99 mg/dL 952 (H) 841 (H) 324 (H) 169 (H)   Inpatient Diabetes Program Recommendations:    Please consider Novolog sensitive Q4H (home meds) Lantus 5 units Q24H. Home meds - 10 units.  Will follow. Thank you. Ailene Ards, RD, LDN, CDE Inpatient Diabetes Coordinator [phone]

## 2015-03-16 NOTE — Clinical Social Work Note (Signed)
Clinical Social Work Assessment  Patient Details  Name: Rachel Vang MRN: [id number] Date of Birth: 10-01-1965  Date of referral:  03/16/15               Reason for consult:  Discharge Planning, Facility Placement                Permission sought to share information with:  Facility Industrial/product designer granted to share information::     Name::        Agency::     Relationship::     Contact Information:     Housing/Transportation Living arrangements for the past 2 months:  Skilled Building surveyor of Information:  Facility, Adult Children Patient Interpreter Needed:  None Criminal Activity/Legal Involvement Pertinent to Current Situation/Hospitalization:  No - Comment as needed Significant Relationships:  Adult Children Lives with:  Facility Resident Do you feel safe going back to the place where you live?  Yes Need for family participation in patient care:  Yes (Comment)  Care giving concerns: No concerns reported at this time.    Social Worker assessment / plan: Pt hospitalized on 03/14/15 due to the accidental removal of trach tube by pt. Pt is from [facility]. Pt is unable to participate in d.c planning. CSW has spoken with pt's sons, Rachel Vang and Rachel Vang to assist with d/c planning. MD plans to d/c pt back to SNF today. Family is in agreement plan. SNF contacted and updated. SNF is able to accept pt today. [facility] has provided 30 day LOG. CSW will assist with d/c planning to SNF today.  Employment status:  Unemployed Health and safety inspector:  Other (Comment Required) (SNF LOG) PT Recommendations:  Not assessed at this time Information / Referral to community resources:     Patient/Family's Response to care:  Family is in agreement with plan for pt to return to SNF today.  Patient/Family's Understanding of and Emotional Response to Diagnosis, Current Treatment, and Prognosis:  Family is aware of pt's medical status. Pt no longer requires trach.    Emotional Assessment Appearance:  Appears stated age Attitude/Demeanor/Rapport:  Unable to Assess Affect (typically observed):  Unable to Assess Orientation:    Alcohol / Substance use:  Not Applicable Psych involvement (Current and /or in the community):  No (Comment)  Discharge Needs  Concerns to be addressed:  No discharge needs identified Readmission within the last 30 days:  Yes Current discharge risk:  None Barriers to Discharge:  No Barriers Identified   Royetta Asal, LCSW  [phone] 03/16/2015, 11:24 AM

## 2015-03-16 NOTE — NC FL2 (Signed)
[place] MEDICAID FL2 LEVEL OF CARE SCREENING TOOL     IDENTIFICATION  Patient Name: Rachel Vang Birthdate: 04-10-1965 Sex: female Admission Date (Current Location): 03/14/2015  [facility] and [place] Number:  Producer, television/film/video and Address:  [facility],  [address] [place], [place] [address]      Provider Number: [id number]  Attending Physician Name and Address:  Louann Sjogren, MD  Relative Name and Phone Number:  [name], son, [phone]    Current Level of Care: Hospital Recommended Level of Care: Skilled Nursing Facility Prior Approval Number:    Date Approved/Denied:   PASRR Number: [id number]  Discharge Plan: SNF    Current Diagnoses: Patient Active Problem List   Diagnosis Date Noted  . Pressure ulcer 03/15/2015  . Tracheostomy complication (HCC) 03/14/2015  . Chronic respiratory failure (HCC) 03/06/2015  . Hemorrhoids with complication   . Diverticulosis of colon without hemorrhage   . Acute pulmonary embolism (HCC) 02/19/2015  . DVT (deep vein thrombosis) in pregnancy 02/19/2015  . Lower GI bleeding 02/19/2015  . Dysphagia 02/18/2015  . Enteritis due to Clostridium difficile 02/18/2015  . HTN (hypertension) 02/18/2015  . Hemophilus influenzae (H. influenzae) as the cause of diseases classified elsewhere 02/18/2015  . Tracheobronchitis 02/18/2015  . E. coli UTI 02/18/2015  . Seizures (HCC) 02/18/2015  . Elevated troponin 02/18/2015  . Urinary retention 02/18/2015  . Sepsis (HCC) 02/17/2015  . Diabetes mellitus (HCC) 02/17/2015  . History of ETT   . Hydrocephalus   . Tracheostomy in place [facility])   . Cerebral edema (HCC)   . Acute and chronic respiratory failure with hypoxia (HCC)   . Central line complication   . Encounter for central line care   . Encounter for feeding tube placement   . Neurological abnormality   . Subarachnoid hemorrhage (HCC)   . Acute respiratory failure with hypoxia and hypercapnia (HCC)   .  Encounter for orogastric (OG) tube placement   . SAH (subarachnoid hemorrhage) (HCC)   . ICH (intracerebral hemorrhage) (HCC) 01/13/2015    Orientation RESPIRATION BLADDER Height & Weight        Normal Indwelling catheter Weight: 64.2 kg (141 lb 8.6 oz) Height:   (165.1 cm)  BEHAVIORAL SYMPTOMS/MOOD NEUROLOGICAL BOWEL NUTRITION STATUS  Other (Comment) (No Behaviors)   Incontinent Feeding tube  AMBULATORY STATUS COMMUNICATION OF NEEDS Skin   Extensive Assist Non-Verbally Surgical wounds (Dressing covering trach site.)                       Personal Care Assistance Level of Assistance  Bathing, Feeding, Dressing, Total care Bathing Assistance: Maximum assistance Feeding assistance: Maximum assistance Dressing Assistance: Maximum assistance Total Care Assistance: Maximum assistance   Functional Limitations Info  Sight, Hearing, Speech Sight Info: Adequate Hearing Info: Adequate Speech Info: Impaired    SPECIAL CARE FACTORS FREQUENCY                       Contractures Contractures Info: Not present    Additional Factors Info  Insulin Sliding Scale, Code Status Code Status Info: Full Code Allergies Info: NKA           Current Medications (03/16/2015):  This is the current hospital active medication list Current Facility-Administered Medications  Medication Dose Route Frequency Provider Last Rate Last Dose  . 0.9 %  sodium chloride infusion  250 mL Intravenous PRN Jeanella Craze, NP      .  acetaminophen (TYLENOL) tablet 650 mg  650 mg Oral Q4H PRN Brandi L Ollis, NPJeanella CrazeODipine (NORVASC) tablet 10 mg  10 mg Per Tube Daily Jeanella Craze, NP   10 mg at 03/16/15 0929  . antiseptic oral rinse (CPC / CETYLPYRIDINIUM CHLORIDE 0.05%) solution 7 mL  7 mL Mouth Rinse q12n4p Jose Angelo A de [place], MD      . bethanechol (URECHOLINE) tablet 10 mg  10 mg Per Tube 3 times per day Jeanella Craze, NP   10 mg at 03/16/15 0656  . chlorhexidine (PERIDEX) 0.12 %  solution 15 mL  15 mL Mouth Rinse BID Jose Alexis Frock, MD   15 mL at 03/16/15 0930  . cloNIDine (CATAPRES) tablet 0.1 mg  0.1 mg Per Tube 3 times per day Jeanella Craze, NP   0.1 mg at 03/16/15 0656  . feeding supplement (JEVITY 1.2 CAL) liquid 1,000 mL  1,000 mL Per Tube Continuous Jose Alexis Frock, MD 65 mL/hr at 03/16/15 0807 1,000 mL at 03/16/15 0807  . fentaNYL (DURAGESIC - dosed mcg/hr) 12.5 mcg  12.5 mcg Transdermal Q72H Jeanella Craze, NP      . lactated ringers infusion   Intravenous Continuous Roslynn Amble, MD 50 mL/hr at 03/16/15 [phone]    . levETIRAcetam (KEPPRA) 100 MG/ML solution 500 mg  500 mg Per Tube BID Jeanella Craze, NP   500 mg at 03/16/15 0929  . metoprolol (LOPRESSOR) injection 2.5 mg  2.5 mg Intravenous Q3H PRN Karl Ito, MD   2.5 mg at 03/16/15 0448  . metoprolol tartrate (LOPRESSOR) 25 mg/10 mL oral suspension 125 mg  125 mg Per Tube BID Jose Alexis Frock, MD   125 mg at 03/16/15 9604  . ondansetron (ZOFRAN) injection 4 mg  4 mg Intravenous Q6H PRN Jeanella Craze, NP      . Warfarin - Pharmacist Dosing Inpatient   Does not apply q1800 Rollene Fare, [facility]   0  at 03/15/15 1408     Discharge Medications: Please see discharge summary for a list of discharge medications.  Relevant Imaging Results:  Relevant Lab Results:   Additional Information SSN: [id number] Dickey Gave, LCSW

## 2015-03-16 NOTE — Progress Notes (Signed)
Pharmacy Antibiotic Note  Rachel Vang is a 50 y.o. female admitted on 03/14/2015 with possible UTI.  Pharmacy has been consulted for piperacillin/tazobactam dosing. She came to hospital for dislodged tracheostomy.  She is bedbound following ICH.  Recently hospitalized for sepsis with  E. Coli and enterobacter in urine cx.  Has urinary catheter.  WBC elevated  Plan:  Zosyn 3.375gm IV q8h over 4h infusion  Height:  (165.1 cm) Weight: 141 lb 8.6 oz (64.2 kg) IBW/kg (Calculated) : 57  Temp (24hrs), Avg:98.7 F (37.1 C), Min:98 F (36.7 C), Max:99.4 F (37.4 C)   Recent Labs Lab 03/14/15 1750 03/15/15 0328 03/16/15 0314 03/16/15 0633  WBC 20.1* 21.2* 23.4*  --   CREATININE 0.46 0.50  --  0.51    Estimated Creatinine Clearance: 74.9 mL/min (by C-G formula based on Cr of 0.51).    No Known Allergies  Antimicrobials this admission: 2/15 >> pip/tazo  Dose adjustments this admission:   Microbiology results: 1/19 urine: E. Coli (pan-sens), E. Cloacae (R to cefazolin, I to NTF) 1/26 respiratory: E. Cloacae (R to cefazolin only) 2/15 urine:   Thank you for allowing pharmacy to be a part of this patient's care.  Juliette Alcide, PharmD, BCPS.   Pager: [phone] 03/16/2015 12:36 PM

## 2015-03-16 NOTE — Clinical Documentation Improvement (Signed)
Critical Care Please update your documentation within the medical record to reflect your response to this query. Thank you  Can the diagnosis of pressure ulcer be further specified?   Document if pressure ulcer with stage is Present on Admission   Document Site with laterality - Elbow, Back (upper/lower), Sacral, Hip, Buttock, Ankle, Heel, Head, Other (Specify)  Pressure Ulcer Stage - Stage1, Stage 2, Stage 3, Stage 4, Unstageable, Unspecified, Unable to Clinically Determine  Document any associated diagnoses/conditions such as: with gangrene  Other  Clinically Undetermined  Supporting Information: Noted per Nutrition eval 03/15/15.Marland KitchenMarland Kitchen"Skin: Wound (see comment) (Unstageable heel ulcer, Stage II sacral ulcer)..."  Please exercise your independent, professional judgment when responding. A specific answer is not anticipated or expected.  Thank You,  Toribio Harbour, RN, BSN, CCDS Certified Clinical Documentation Specialist [place]: Health Information Management [phone]

## 2015-03-16 NOTE — Progress Notes (Signed)
eLink Physician-Brief Progress Note Patient Name: Rachel Vang DOB: [birth year] MRN: [id number]   Date of Service  03/16/2015  HPI/Events of Note  Sinus Tachycardia - HR = 135. No fever. Patient is already on Metoprolol 125 mg PO Q 12 hours.   eICU Interventions  Will add Metoprolol 2.5 mg IV Q 3 hours PRN HR > 115.     Intervention Category Major Interventions: Arrhythmia - evaluation and management  Ry Moody Eugene 03/16/2015, 4:25 AM

## 2015-03-16 NOTE — [place] FL2 (Deleted)
[place] MEDICAID FL2 LEVEL OF CARE SCREENING TOOL     IDENTIFICATION  Patient Name: Rachel Vang Birthdate: 04-10-1965 Sex: female Admission Date (Current Location): 03/14/2015  [facility] and [place] Number:  Producer, television/film/video and Address:  [facility],  [address] [place], [place] [address]      Provider Number: [id number]  Attending Physician Name and Address:  Louann Sjogren, MD  Relative Name and Phone Number:  [name], son, [phone]    Current Level of Care: Hospital Recommended Level of Care: Skilled Nursing Facility Prior Approval Number:    Date Approved/Denied:   PASRR Number: [id number]  Discharge Plan: SNF    Current Diagnoses: Patient Active Problem List   Diagnosis Date Noted  . Pressure ulcer 03/15/2015  . Tracheostomy complication (HCC) 03/14/2015  . Chronic respiratory failure (HCC) 03/06/2015  . Hemorrhoids with complication   . Diverticulosis of colon without hemorrhage   . Acute pulmonary embolism (HCC) 02/19/2015  . DVT (deep vein thrombosis) in pregnancy 02/19/2015  . Lower GI bleeding 02/19/2015  . Dysphagia 02/18/2015  . Enteritis due to Clostridium difficile 02/18/2015  . HTN (hypertension) 02/18/2015  . Hemophilus influenzae (H. influenzae) as the cause of diseases classified elsewhere 02/18/2015  . Tracheobronchitis 02/18/2015  . E. coli UTI 02/18/2015  . Seizures (HCC) 02/18/2015  . Elevated troponin 02/18/2015  . Urinary retention 02/18/2015  . Sepsis (HCC) 02/17/2015  . Diabetes mellitus (HCC) 02/17/2015  . History of ETT   . Hydrocephalus   . Tracheostomy in place [facility])   . Cerebral edema (HCC)   . Acute and chronic respiratory failure with hypoxia (HCC)   . Central line complication   . Encounter for central line care   . Encounter for feeding tube placement   . Neurological abnormality   . Subarachnoid hemorrhage (HCC)   . Acute respiratory failure with hypoxia and hypercapnia (HCC)   .  Encounter for orogastric (OG) tube placement   . SAH (subarachnoid hemorrhage) (HCC)   . ICH (intracerebral hemorrhage) (HCC) 01/13/2015    Orientation RESPIRATION BLADDER Height & Weight        Normal Indwelling catheter Weight: 64.2 kg (141 lb 8.6 oz) Height:   (165.1 cm)  BEHAVIORAL SYMPTOMS/MOOD NEUROLOGICAL BOWEL NUTRITION STATUS  Other (Comment) (No Behaviors)   Incontinent Feeding tube  AMBULATORY STATUS COMMUNICATION OF NEEDS Skin   Extensive Assist Non-Verbally Surgical wounds (Dressing covering trach site.)                       Personal Care Assistance Level of Assistance  Bathing, Feeding, Dressing, Total care Bathing Assistance: Maximum assistance Feeding assistance: Maximum assistance Dressing Assistance: Maximum assistance Total Care Assistance: Maximum assistance   Functional Limitations Info  Sight, Hearing, Speech Sight Info: Adequate Hearing Info: Adequate Speech Info: Impaired    SPECIAL CARE FACTORS FREQUENCY                       Contractures Contractures Info: Not present    Additional Factors Info  Insulin Sliding Scale, Code Status Code Status Info: Full Code Allergies Info: NKA           Current Medications (03/16/2015):  This is the current hospital active medication list Current Facility-Administered Medications  Medication Dose Route Frequency Provider Last Rate Last Dose  . 0.9 %  sodium chloride infusion  250 mL Intravenous PRN Jeanella Craze, NP      .  acetaminophen (TYLENOL) tablet 650 mg  650 mg Oral Q4H PRN Brandi L Ollis, NP      . amLODipine (NORVASC) tablet 10 mg  10 mg Per Tube Daily Brandi L Ollis, NP   10 mg at 03/16/15 0929  . antiseptic oral rinse (CPC / CETYLPYRIDINIUM CHLORIDE 0.05%) solution 7 mL  7 mL Mouth Rinse q12n4p Jose Angelo A de Dios, MD      . bethanechol (URECHOLINE) tablet 10 mg  10 mg Per Tube 3 times per day Brandi L Ollis, NP   10 mg at 03/16/15 0656  . chlorhexidine (PERIDEX) 0.12 %  solution 15 mL  15 mL Mouth Rinse BID Jose Angelo A de Dios, MD   15 mL at 03/16/15 0930  . cloNIDine (CATAPRES) tablet 0.1 mg  0.1 mg Per Tube 3 times per day Brandi L Ollis, NP   0.1 mg at 03/16/15 0656  . feeding supplement (JEVITY 1.2 CAL) liquid 1,000 mL  1,000 mL Per Tube Continuous Jose Angelo A de Dios, MD 65 mL/hr at 03/16/15 0807 1,000 mL at 03/16/15 0807  . fentaNYL (DURAGESIC - dosed mcg/hr) 12.5 mcg  12.5 mcg Transdermal Q72H Brandi L Ollis, NP      . lactated ringers infusion   Intravenous Continuous Jennings E Nestor, MD 50 mL/hr at 03/16/15 0927    . levETIRAcetam (KEPPRA) 100 MG/ML solution 500 mg  500 mg Per Tube BID Brandi L Ollis, NP   500 mg at 03/16/15 0929  . metoprolol (LOPRESSOR) injection 2.5 mg  2.5 mg Intravenous Q3H PRN Steven E Sommer, MD   2.5 mg at 03/16/15 0448  . metoprolol tartrate (LOPRESSOR) 25 mg/10 mL oral suspension 125 mg  125 mg Per Tube BID Jose Angelo A de Dios, MD   125 mg at 03/16/15 0927  . ondansetron (ZOFRAN) injection 4 mg  4 mg Intravenous Q6H PRN Brandi L Ollis, NP      . Warfarin - Pharmacist Dosing Inpatient   Does not apply q1800 Erin R Williamson, RPH   0  at 03/15/15 1408     Discharge Medications: Please see discharge summary for a list of discharge medications.  Relevant Imaging Results:  Relevant Lab Results:   Additional Information SSN: [id number]  Carly Sabo Lee, LCSW     

## 2015-03-16 NOTE — Progress Notes (Signed)
CSW assisting with d/c planning. D/c is on hold due to medical concerns. NSG to contact family with update. CSW has contacted [facility] with update.   Cori Razor LCSW [phone]

## 2015-03-16 NOTE — Progress Notes (Signed)
Name: Rachel Vang MRN: [id number] DOB: [birth year]    ADMISSION DATE:  03/14/2015   CHIEF COMPLAINT:  Tracheostomy dislodgement    SUBJECTIVE: RN reports no acute events.  Patient respiratory status stable on room air.  WBC 23k  VITAL SIGNS: Temp:  [98 F (36.7 C)-99.4 F (37.4 C)] 98 F (36.7 C) (02/15 0800) Pulse Rate:  [122-137] 133 (02/15 0800) Resp:  [23-31] 27 (02/15 0800) BP: (113-146)/(79-97) 129/87 mmHg (02/15 0800) SpO2:  [96 %-100 %] 100 % (02/15 0800) Weight:  [141 lb 8.6 oz (64.2 kg)] 141 lb 8.6 oz (64.2 kg) (02/15 0215)  PHYSICAL EXAMINATION: General: chronically ill appearing female in NAD Neuro: awake, smiles when spoken to, nods yes/no to questions CV: s1s2 rrr, no m/r/g PULM: even/non-labored, lungs bilaterally clear, moving good air, no wheezing, able to phonate intermittently GI: obese, soft, bsx4 active  Extremities: warm/dry, no edema     Recent Labs Lab 03/14/15 1750 03/15/15 0328 03/16/15 0633  NA 131* 135 134*  K 4.1 4.2 3.8  CL 97* 97* 99*  CO2 BUN CREATININE 0.46 0.50 0.51  GLUCOSE 119* 166* 213*    Recent Labs Lab 03/14/15 1750 03/15/15 0328 03/16/15 0314  HGB 10.8* 12.1 11.3*  HCT 34.2* 39.4 36.8  WBC 20.1* 21.2* 23.4*  PLT 313 393 414*   Dg Chest Port 1 View  03/16/2015  CLINICAL DATA:  Hypertension.  Reported tracheostomy complication EXAM: PORTABLE CHEST 1 VIEW COMPARISON:  February 20, 2015 and March 15, 2015 FINDINGS: There is no demonstrable tracheostomy or endotracheal tube. Lungs are clear. Heart is borderline prominent with pulmonary vascularity within normal limits. No adenopathy. No pneumothorax or pneumomediastinum. IMPRESSION: Lungs clear.  Heart prominent but stable. Electronically Signed   By: Bretta Bang III M.D.   On: 03/16/2015 07:24   Dg Chest Port 1 View  03/15/2015  CLINICAL DATA:  Tracheostomy dislodgement. EXAM: PORTABLE CHEST 1 VIEW COMPARISON:  02/20/2015 FINDINGS: The  tracheostomy tube is currently absent.  No endotracheal tube. Linear subsegmental atelectasis at the left lung base. Mild enlargement of the cardiopericardial silhouette, without edema. No pneumomediastinum are pneumothorax. IMPRESSION: 1. Tracheostomy tube is newly absent. No pneumomediastinum or pneumothorax. 2. Stable mild enlargement of the cardiopericardial silhouette. 3. Linear subsegmental atelectasis in the left lower lobe. Electronically Signed   By: Gaylyn Rong M.D.   On: 03/15/2015 07:27    ASSESSMENT / PLAN:  DISCUSSION:  56F, chronically bed bound secondary to IC bleed (12/2014), with complicated hospitalization in 12/2014. Pt also with PE and LLL DVT in 01/2015 admission on coumadin.  Discharged with a trach to SNF and she pulled it out 2/14. Also, has a PEG. No signs and sx of infections.  Apparently, pt was O2 dependent at SNF. At the ED, her O2 sats were 97-99 % on RA. Unclear if she has OSA.    S/P Tracheostomy for prolonged intubation / IC bleed (12/2014) S/P Accidental removal of trach (03/14/15) by patient Unclear if baseline OSA  Plan: [name] out since 2/13 without any distress, remains on room air.  No secretions.  Further discussion with family and they agree to hold off on replacement of tracheostomy at this time.  If she were to decompensate, the family would be ok with replacement in the future. Pulmonary hygiene as able - upright positioning, mobilize Aspiration precautions  Keep trach site covered until completely closed   Leukocytosis - admit WBC 20k, rise to 23k 2/15  Plan:  Replace foley catheter Assess UA post replacement  PE, LLL DVT (01/2015) on Coumadin   Plan: Resume Coumadin per pharmacy 2/14  Trend INR    S/P PEG (12/2014) Protein Calorie Malnutrition   Plan: Resume TF  Nutrition consult  PEG care per protocol   Deconditioning, bed bound Hx CVA / ICH Chronic Pain - ? Difficult to assess, on fentanyl patch, pt denies  pain  Plan: Chair position as tolerated  Continue home keppra  Resume fentanyl patch at 12.5 mcg, recommend taper to off    HTN Tachycardia    Plan: Resume home norvasc, clonidine, metoprolol   Stage II Sacral Ulcer - present on admission Unstageable R Heel Ulcer - present on admission   Plan: Protective sacral dressing  WOC consult for heel ulcer recomendations  GLOBAL:   No family at bedside am 2/15.  Pending results of UA, may be able to return to [facility] 2/15.     Canary Brim, NP-C [place] [facility] Pgr: [phone] or if no answer [phone] 03/16/2015, 10:19 AM   PCCM Attending Note: Patient seen and examined with nurse practitioner. Please refer to her progress note which I reviewed in detail. Status post tracheal dislodgment. Patient reportedly verbalizing earlier. Appears comfortable on room air with no signs of distress.   BP 109/77 mmHg  Pulse 104  Temp(Src) 98 F (36.7 C) (Oral)  Resp 26  Ht  (1.651 m)  Wt 141 lb 8.6 oz (64.2 kg)  BMI 23.55 kg/m2  SpO2 100%  LMP  (LMP Unknown) Gen.: Laying in bed. Head turned to left side. No distress. Pulmonary: Normal work of breathing on room air. Symmetric chest rise. Neurological: Patient tracks to voice. Does not verbalize. Spontaneously moving extremity.  CBC Latest Ref Rng 03/16/2015 03/15/2015 03/14/2015  WBC 4.0 - 10.5 K/uL 23.4(H) 21.2(H) 20.1(H)  Hemoglobin 12.0 - 15.0 g/dL 11.3(L) 12.1 10.8(L)  Hematocrit 36.0 - 46.0 % 36.8 39.4 34.2(L)  Platelets 150 - 400 K/uL 414(H) 393 313    BMP Latest Ref Rng 03/16/2015 03/15/2015 03/14/2015  Glucose 65 - 99 mg/dL 454(U) 981(X) 914(N)  BUN 6 - 20 mg/dL Creatinine 0.44 - 1.00 mg/dL 8.29 5.62 1.30  Sodium 135 - 145 mmol/L 134(L) 135 131(L)  Potassium 3.5 - 5.1 mmol/L 3.8 4.2 4.1  Chloride 101 - 111 mmol/L 99(L) 97(L) 97(L)  CO2 22 - 32 mmol/L Calcium 8.9 - 10.3 mg/dL 9.3 9.8 9.4    A/P: 50 year old female status post CVA/ICH and  tracheostomy. Status post tracheostomy dislodgment. Patient may have a urinary tract infection. Foley catheter change.  1. Status post tracheostomy: Placed for prolonged intubation from intracranial hemorrhage. Plan to leave tracheostomy out for now. With close monitoring. 2. Probable UTI: Empiric Zosyn. Sending urine culture. Foley changed.  3. PE/left lower extremity DVT: Coumadin per pharmacy protocol. 4. CVA/ICH: Continuing Keppra. 5. Diet: Continuing tube feeds via PEG. 6. Disposition:  D/C patient back to SNF once urine culture results.  Donna Christen Jamison Neighbor, M.D. [place] [facility] Pager: [phone] After 3pm or if no response, call [phone]

## 2015-03-16 NOTE — Consult Note (Signed)
WOC wound consult note Reason for Consult:Right heel deep tissue injury, present on admission.  Wound type:deep tissue injury present on admission Pressure Ulcer POA: Yes Measurement: 4 cm x 2.2 cm intact maroon discoloration, present on admission.  Wound ZOX:WRUEAV dark wound center Drainage (amount, consistency, odor) none Periwound:Blanchable erythema Dressing procedure/placement/frequency: Offload pressure to bilateral heels.  Prevalon boot to bilateral heels  Will not follow at this time.  Please re-consult if needed.  Maple Hudson RN BSN CWON Pager [phone]

## 2015-03-17 DIAGNOSIS — R739 Hyperglycemia, unspecified: Secondary | ICD-10-CM

## 2015-03-17 DIAGNOSIS — T8351XA Infection and inflammatory reaction due to indwelling urinary catheter, initial encounter: Secondary | ICD-10-CM

## 2015-03-17 LAB — CBC
HEMATOCRIT: 34.6 % — AB (ref 36.0–46.0)
HEMOGLOBIN: 10.9 g/dL — AB (ref 12.0–15.0)
MCH: 29.1 pg (ref 26.0–34.0)
MCHC: 31.5 g/dL (ref 30.0–36.0)
MCV: 92.5 fL (ref 78.0–100.0)
Platelets: 309 10*3/uL (ref 150–400)
RBC: 3.74 MIL/uL — AB (ref 3.87–5.11)
RDW: 16 % — ABNORMAL HIGH (ref 11.5–15.5)
WBC: 19.7 10*3/uL — ABNORMAL HIGH (ref 4.0–10.5)

## 2015-03-17 LAB — BASIC METABOLIC PANEL
ANION GAP: 11 (ref 5–15)
BUN: 14 mg/dL (ref 6–20)
CALCIUM: 8.8 mg/dL — AB (ref 8.9–10.3)
CO2: 24 mmol/L (ref 22–32)
Chloride: 100 mmol/L — ABNORMAL LOW (ref 101–111)
Creatinine, Ser: 0.49 mg/dL (ref 0.44–1.00)
GFR calc non Af Amer: >60 mL/min (ref >60)
Glucose, Bld: 193 mg/dL — ABNORMAL HIGH (ref 65–99)
POTASSIUM: 3.5 mmol/L (ref 3.5–5.1)
Sodium: 135 mmol/L (ref 135–145)

## 2015-03-17 LAB — GLUCOSE, CAPILLARY
GLUCOSE-CAPILLARY: 191 mg/dL — AB (ref 65–99)
GLUCOSE-CAPILLARY: 232 mg/dL — AB (ref 65–99)
Glucose-Capillary: 222 mg/dL — ABNORMAL HIGH (ref 65–99)
Glucose-Capillary: 230 mg/dL — ABNORMAL HIGH (ref 65–99)
Glucose-Capillary: 145 mg/dL — ABNORMAL HIGH (ref 65–99)
Glucose-Capillary: 137 mg/dL — ABNORMAL HIGH (ref 65–99)

## 2015-03-17 LAB — PROTIME-INR
INR: 1.86 — ABNORMAL HIGH (ref 0.00–1.49)
PROTHROMBIN TIME: 21.4 s — AB (ref 11.6–15.2)

## 2015-03-17 MED ORDER — INSULIN ASPART 100 UNIT/ML ~~LOC~~ SOLN
0.0000 [IU] | SUBCUTANEOUS | Status: DC
  Administered 2015-03-17: 3 [IU] via SUBCUTANEOUS
  Administered 2015-03-17 – 2015-03-18 (×4): 1 [IU] via SUBCUTANEOUS
  Administered 2015-03-18 (×2): 3 [IU] via SUBCUTANEOUS
  Administered 2015-03-19 (×2): 2 [IU] via SUBCUTANEOUS
  Administered 2015-03-19: 1 [IU] via SUBCUTANEOUS
  Administered 2015-03-19 – 2015-03-20 (×6): 2 [IU] via SUBCUTANEOUS
  Administered 2015-03-20: 1 [IU] via SUBCUTANEOUS
  Administered 2015-03-20: 2 [IU] via SUBCUTANEOUS
  Administered 2015-03-20 – 2015-03-21 (×2): 1 [IU] via SUBCUTANEOUS
  Administered 2015-03-21: 2 [IU] via SUBCUTANEOUS
  Administered 2015-03-21 (×2): 1 [IU] via SUBCUTANEOUS
  Administered 2015-03-21: 2 [IU] via SUBCUTANEOUS
  Administered 2015-03-22: 3 [IU] via SUBCUTANEOUS
  Administered 2015-03-22: 2 [IU] via SUBCUTANEOUS
  Administered 2015-03-22: 1 [IU] via SUBCUTANEOUS

## 2015-03-17 MED ORDER — WARFARIN SODIUM 4 MG PO TABS
8.0000 mg | ORAL_TABLET | Freq: Once | ORAL | Status: AC
  Administered 2015-03-17: 8 mg via ORAL
  Filled 2015-03-17: qty 2

## 2015-03-17 NOTE — Progress Notes (Signed)
Pt trans from ICU/STEPDOWN to 6E. NSG reports that pt is not ready for d/c today. [facility] updated. SNF bed will be available tomorrow if pt is stable for d/c.  Cori Razor LCSW [phone]

## 2015-03-17 NOTE — Progress Notes (Signed)
Name: Rachel Vang MRN: [id number] DOB: 1965/11/12    ADMISSION DATE:  03/14/2015   CHIEF COMPLAINT:  Tracheostomy dislodgement    SUBJECTIVE: RN reports no acute events.  Pt more alert, responsive / communicative.  Urine culture pending   VITAL SIGNS: Temp:  [98.8 F (37.1 C)-100.4 F (38 C)] 98.8 F (37.1 C) (02/16 0704) Pulse Rate:  [101-114] 112 (02/16 0800) Resp:  [20-33] 26 (02/16 0800) BP: (106-138)/(62-95) 125/89 mmHg (02/16 0800) SpO2:  [97 %-100 %] 100 % (02/16 0800) Weight:  [157 lb 10.1 oz (71.5 kg)] 157 lb 10.1 oz (71.5 kg) (02/16 0800)  PHYSICAL EXAMINATION: General: chronically ill appearing female in NAD Neuro: awake, smiles when spoken to, nods yes/no to questions, will phonate some simple answers but slow to respond CV: s1s2 rrr, no m/r/g PULM: even/non-labored, lungs bilaterally clear, moving good air, no wheezing, able to phonate intermittently GI: obese, soft, bsx4 active  Extremities: warm/dry, no edema     Recent Labs Lab 03/15/15 0328 03/16/15 0633 03/17/15 0306  NA 135 134* 135  K 4.2 3.8 3.5  CL 97* 99* 100*  CO2 BUN CREATININE 0.50 0.51 0.49  GLUCOSE 166* 213* 193*    Recent Labs Lab 03/15/15 0328 03/16/15 0314 03/17/15 0306  HGB 12.1 11.3* 10.9*  HCT 39.4 36.8 34.6*  WBC 21.2* 23.4* 19.7*  PLT 393 414* 309   Dg Chest Port 1 View  03/16/2015  CLINICAL DATA:  Hypertension.  Reported tracheostomy complication EXAM: PORTABLE CHEST 1 VIEW COMPARISON:  February 20, 2015 and March 15, 2015 FINDINGS: There is no demonstrable tracheostomy or endotracheal tube. Lungs are clear. Heart is borderline prominent with pulmonary vascularity within normal limits. No adenopathy. No pneumothorax or pneumomediastinum. IMPRESSION: Lungs clear.  Heart prominent but stable. Electronically Signed   By: Bretta Bang III M.D.   On: 03/16/2015 07:24    ASSESSMENT / PLAN:  DISCUSSION:  69F, chronically bed bound secondary  to IC bleed (12/2014), with complicated hospitalization in 12/2014. Pt also with PE and LLL DVT in 01/2015 admission on coumadin.  Discharged with a trach to SNF and she pulled it out 2/14. Also, has a PEG. No signs and sx of infections.  Apparently, pt was O2 dependent at SNF. At the ED, her O2 sats were 97-99 % on RA. Unclear if she has OSA.    S/P Tracheostomy for prolonged intubation / IC bleed (12/2014) S/P Accidental removal of trach (03/14/15) by patient Unclear if baseline OSA  Plan: [name] out since 2/13 without any distress, remains on room air.  No secretions.  After further discussion with family and they agree to hold off on replacement of tracheostomy at this time.  If she were to decompensate, the family would be ok with replacement in the future. Pulmonary hygiene as able - upright positioning, mobilize Aspiration precautions  Keep trach site covered until completely closed   Leukocytosis - admit WBC 20k, rise to 23k 2/15, improving.    Plan:  Replace foley catheter Assess UA / UC post replacement Await UC prior to d/c Zosyn D2/x  PE, LLL DVT (01/2015) on Coumadin  Anemia   Plan: Resume Coumadin per pharmacy 2/14  Trend INR  Trend CBC   S/P PEG (12/2014) Protein Calorie Malnutrition   Plan: Resume TF  Nutrition consult  PEG care per protocol   Deconditioning, bed bound Hx CVA / ICH Chronic Pain - ? Difficult to assess, on fentanyl patch, pt  denies pain  Plan: Chair position as tolerated  Continue home keppra  Resume fentanyl patch at 12.5 mcg, recommend taper to off    HTN Tachycardia    Plan: Resume home norvasc, clonidine, metoprolol   Stage II Sacral Ulcer - present on admission Unstageable R Heel Ulcer - present on admission   Plan: Protective sacral dressing  WOC consult for heel ulcer recomendations  GLOBAL:   No family at bedside am 2/16.  Pending results of UC, may be able to return to [facility] 2/16.     Canary Brim, NP-C [place]  [facility] Pgr: [phone] or if no answer [phone] 03/17/2015, 9:58 AM   PCCM Attending Note: Patient seen and examined with nurse practitioner. Please refer to her progress note which I reviewed in detail. Patient with improving interaction. Still not answering questions appropriately for me.  BP 132/91 mmHg  Pulse 117  Temp(Src) 98.8 F (37.1 C) (Axillary)  Resp 27  Ht  (1.651 m)  Wt 157 lb 10.1 oz (71.5 kg)  BMI 26.23 kg/m2  SpO2 97%  LMP  (LMP Unknown) Gen.: Laying in bed. No distress. Awake & smiling. Pulmonary: Normal work of breathing on room air. Symmetric chest rise. Neurological: Patient tracks to voice. Attempts to answer questions. Spontaneously moving extremity.  CBC Latest Ref Rng 03/17/2015 03/16/2015 03/15/2015  WBC 4.0 - 10.5 K/uL 19.7(H) 23.4(H) 21.2(H)  Hemoglobin 12.0 - 15.0 g/dL 10.9(L) 11.3(L) 12.1  Hematocrit 36.0 - 46.0 % 34.6(L) 36.8 39.4  Platelets 150 - 400 K/uL 309 414(H) 393    BMP Latest Ref Rng 03/17/2015 03/16/2015 03/15/2015  Glucose 65 - 99 mg/dL 454(U) 981(X) 914(N)  BUN 6 - 20 mg/dL Creatinine 0.44 - 1.00 mg/dL 8.29 5.62 1.30  Sodium 135 - 145 mmol/L 135 134(L) 135  Potassium 3.5 - 5.1 mmol/L 3.5 3.8 4.2  Chloride 101 - 111 mmol/L 100(L) 99(L) 97(L)  CO2 22 - 32 mmol/L Calcium 8.9 - 10.3 mg/dL 8.6(V) 9.3 9.39    A/P: 50 year old female status post CVA/ICH and tracheostomy. Status post tracheostomy dislodgment. Respiratory status is stable. Patient's leukocytosis improving indicating response to UTI treatment. Awaiting urine culture results before planning disposition.   1. Status post tracheostomy: Placed for prolonged intubation from intracranial hemorrhage. Plan to leave tracheostomy out for now.  2. Probable UTI: Empiric Zosyn. Foley changed 2/15. Awaiting culture results.  3. PE/left lower extremity DVT: Coumadin per pharmacy protocol. 4. CVA/ICH: Continuing Keppra. 5. Hyperglycemia:  Continuing  accu-checks q4hr w/ SSI per algorithm. 6. Diet: Continuing tube feeds via PEG. 7. Disposition: D/C patient back to SNF once urine culture results.  Donna Christen Jamison Neighbor, M.D. [place] [facility] Pager: [phone] After 3pm or if no response, call [phone]

## 2015-03-17 NOTE — Progress Notes (Signed)
Nutrition Follow-up  DOCUMENTATION CODES:   Not applicable  INTERVENTION:  -Continue Jevity 1.2 @ 65 mL/hr via PEG - free water every 4 hours  -TF regimen provides 1872 calories, 87g protein, and 1859 ml H2O.  -RD to continue to monitor for needs.   NUTRITION DIAGNOSIS:   Inadequate oral intake related to inability to eat as evidenced by NPO status.  ongoing  GOAL:   Patient will meet greater than or equal to 90% of their needs  meeting  MONITOR:   Labs, Weight trends, I & O's, Skin  REASON FOR ASSESSMENT:   Low Braden, Consult Enteral/tube feeding initiation and management  ASSESSMENT:   50 y.o. female, with past medical history of hypertension, recent lengthy hospitalization in 12/2014, secondary to intracranial hemorrhage, status post tracheostomy, PEG tube and chronic Foley, discharged on 02/14/2015 to SNF. Pt was admitted in 01/2015 for resp failure 2 to saddle embolus/ LLE DVT  2/16 - Pt has been fine without trach at this time. Tolerating Jevity 1.2 no issues. Pt is non-verbal but occasionally nods or shakes head to questions.  Nutrition-Focused physical exam completed. Findings are mild fat depletion, mild muscle depletion, and no edema.   No family at bedside. Per NP note, concern for UTI preventing discharge.   Labs and Medications reviewed. K, Mg, Phos WNL   2/14 -   Pt in room being assessed by RT. Unable to speak with patient at this time. Per H&P, pt not very communicative. Per chart review, pt with unstageable wound on heel and stage II sacral ulcers. PEG was placed during previous admission in December. Was recommended to use Jevity 1.2 @ 50 ml/hr with 30 ml Prostat BID. Pt removed tracheostomy. There is a hold on replacing her trach at this time.   Per weight history, pt has lost 16 lb since 1/16 (10% weight loss x 1 month, significant for time frame). Will perform NFPE at follow-up.   Labs reviewed: Diet Order:     Skin:  Wound (see  comment) (Unstageable heel ulcer, Stage II sacral ulcer)  Last BM:  2/14  Height:   Ht Readings from Last 1 Encounters:  03/15/15  (1.651 m)    Weight:   Wt Readings from Last 1 Encounters:  03/17/15 157 lb 10.1 oz (71.5 kg)    Ideal Body Weight:  56.8 kg  BMI:  Body mass index is 26.23 kg/(m^2).  Estimated Nutritional Needs:   Kcal:  1800-2000  Protein:  85-95g  Fluid:  1.8L/day  EDUCATION NEEDS:   No education needs identified at this time  Dionne Ano. Evans Levee, MS, RD LDN After Hours/Weekend Pager [phone]

## 2015-03-17 NOTE — Progress Notes (Signed)
ANTICOAGULATION CONSULT NOTE   Pharmacy Consult for Warfarin Indication: DVT/PE  No Known Allergies  Patient Measurements: Height:  (165.1 cm) Weight: 157 lb 10.1 oz (71.5 kg) IBW/kg (Calculated) : 57  Vital Signs: Temp: 98.8 F (37.1 C) (02/16 0704) Temp Source: Axillary (02/16 0704) BP: 132/91 mmHg (02/16 1000) Pulse Rate: 117 (02/16 1000)  Labs:  Recent Labs  03/14/15 1907 03/15/15 0328 03/16/15 0314 03/16/15 0633 03/17/15 0306  HGB  --  12.1 11.3*  --  10.9*  HCT  --  39.4 36.8  --  34.6*  PLT  --  393 414*  --  309  APTT 39*  --   --   --   --   LABPROT 18.3*  --  20.8*  --  21.4*  INR 1.51*  --  1.80*  --  1.86*  CREATININE  --  0.50  --  0.51 0.49    Estimated Creatinine Clearance: 82.5 mL/min (by C-G formula based on Cr of 0.49).   Medications:  Scheduled:  . amLODipine  10 mg Per Tube Daily  . antiseptic oral rinse  7 mL Mouth Rinse q12n4p  . bethanechol  10 mg Per Tube 3 times per day  . chlorhexidine  15 mL Mouth Rinse BID  . cloNIDine  0.1 mg Per Tube 3 times per day  . fentaNYL  12.5 mcg Transdermal Q72H  . levETIRAcetam  500 mg Per Tube BID  . metoprolol tartrate  125 mg Per Tube BID  . piperacillin-tazobactam (ZOSYN)  IV  3.375 g Intravenous 3 times per day  . Warfarin - Pharmacist Dosing Inpatient   Does not apply q1800   Infusions:  . feeding supplement (JEVITY 1.2 CAL) 1,000 mL (03/17/15 0846)  . 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  She is s/p tracheostomy, PEG tube and chronic Foley, discharged on 02/14/2015 to SNF. Pt was admitted in 01/2015 for resp failure 2 to saddle embolus/ LLE DVT for which she was started on warfarin. She was discharged on 02/22/15 to SNF.  She was readmitted 2/14 after pulling out her trach.  Warfarin was initially held until plans were determined  for replacing trach.  PTA dose of warfarin reported as  daily, INR subtherapeutic (1.5) on admit.  Since patient is in no distress with trach out, PCCM will hold off on replacement.  Pharmacy has been consulted to resume warfarin dosing.  Today, 03/17/2015:  INR subtherapeutic but trending up toward goal  CBC: Hgb relatively stable and Pltc WNL  TF resumed 2/14   No major drug interactions   Goal of Therapy:  INR 2-3   Plan:   Warfarin  PO x 1 today (same as prior to admission dose as reported on SNF MAR)  Daily PT/INR - watch INR trend  Monitor closely for signs/symptoms of bleeding  Juliette Alcide, PharmD, BCPS.   Pager: [phone] 03/17/2015 10:57 AM

## 2015-03-17 NOTE — Progress Notes (Signed)
Inpatient Diabetes Program Recommendations  AACE/ADA: New Consensus Statement on Inpatient Glycemic Control (2015)  Target Ranges:  Prepandial:   less than 140 mg/dL      Peak postprandial:   less than 180 mg/dL (1-2 hours)      Critically ill patients:  140 - 180 mg/dL   Review of Glycemic Control  Results for Rachel Vang, Rachel Vang (MRN [id number]) as of 03/17/2015 12:12  Ref. Range 03/16/2015 19:56 03/16/2015 23:56 03/17/2015 03:27 03/17/2015 08:29  Glucose-Capillary Latest Ref Range: 65-99 mg/dL 409 (H) 811 (H) 914 (H) 222 (H)   Needs TF coverage.  Inpatient Diabetes Program Recommendations:    Add Novolog 3 units Q4H for TF coverage.  Will follow. Thank you. Ailene Ards, RD, LDN, CDE Inpatient Diabetes Coordinator [phone]

## 2015-03-18 LAB — GLUCOSE, CAPILLARY
GLUCOSE-CAPILLARY: 139 mg/dL — AB (ref 65–99)
GLUCOSE-CAPILLARY: 240 mg/dL — AB (ref 65–99)
GLUCOSE-CAPILLARY: 89 mg/dL (ref 65–99)
Glucose-Capillary: 100 mg/dL — ABNORMAL HIGH (ref 65–99)
Glucose-Capillary: 136 mg/dL — ABNORMAL HIGH (ref 65–99)
Glucose-Capillary: 210 mg/dL — ABNORMAL HIGH (ref 65–99)

## 2015-03-18 LAB — URINE CULTURE

## 2015-03-18 LAB — PROTIME-INR
INR: 1.89 — AB (ref 0.00–1.49)
PROTHROMBIN TIME: 21.6 s — AB (ref 11.6–15.2)

## 2015-03-18 MED ORDER — WARFARIN SODIUM 6 MG PO TABS
9.0000 mg | ORAL_TABLET | Freq: Once | ORAL | Status: AC
  Administered 2015-03-18: 9 mg via ORAL
  Filled 2015-03-18: qty 1

## 2015-03-18 NOTE — Progress Notes (Signed)
Name: Rachel Vang MRN: [id number] DOB: Mar 13, 1965    ADMISSION DATE:  03/14/2015   CHIEF COMPLAINT:  Tracheostomy dislodgement    SUBJECTIVE:   Pt reports "feeling bad" today.  No acute events.  Denies SOB.  VITAL SIGNS: Temp:  [98.2 F (36.8 C)-99.9 F (37.7 C)] 99.9 F (37.7 C) (02/17 1239) Pulse Rate:  [29-108] 95 (02/17 1239) Resp:  [20-25] 20 (02/17 1239) BP: (120-146)/(66-87) 126/66 mmHg (02/17 1239) SpO2:  [94 %-100 %] 100 % (02/17 1239) Weight:  [162 lb 0.6 oz (73.5 kg)] 162 lb 0.6 oz (73.5 kg) (02/17 0700)  PHYSICAL EXAMINATION: General: chronically ill appearing female in NAD Neuro: awake, nods yes/no to questions, will phonate some simple answers but slow to respond CV: s1s2 rrr, no m/r/g PULM: even/non-labored, lungs bilaterally clear, moving good air, no wheezing, able to phonate intermittently GI: obese, soft, bsx4 active  Extremities: warm/dry, no edema     Recent Labs Lab 03/15/15 0328 03/16/15 0633 03/17/15 0306  NA 135 134* 135  K 4.2 3.8 3.5  CL 97* 99* 100*  CO2 BUN CREATININE 0.50 0.51 0.49  GLUCOSE 166* 213* 193*    Recent Labs Lab 03/15/15 0328 03/16/15 0314 03/17/15 0306  HGB 12.1 11.3* 10.9*  HCT 39.4 36.8 34.6*  WBC 21.2* 23.4* 19.7*  PLT 393 414* 309   No results found.  ASSESSMENT / PLAN:  DISCUSSION:  16F, chronically bed bound secondary to IC bleed (12/2014), with complicated hospitalization in 12/2014. Pt also with PE and LLL DVT in 01/2015 admission on coumadin.  Discharged with a trach to SNF and she pulled it out 2/14. Also, has a PEG. No signs and sx of infections.  Apparently, pt was O2 dependent at SNF. At the ED, her O2 sats were 97-99 % on RA. Unclear if she has baseline OSA.    S/P Tracheostomy for prolonged intubation / IC bleed (12/2014) S/P Accidental removal of trach (03/14/15) by patient Unclear if baseline OSA  Plan: [name] out since 2/13 without any distress, remains on room  air.  No secretions.  After further discussion with family and they agree to hold off on replacement of tracheostomy at this time.  If she were to decompensate, the family would be ok with replacement in the future. Pulmonary hygiene as able - upright positioning, mobilize Aspiration precautions  Keep trach site covered until completely closed   Leukocytosis - admit WBC 20k, rise to 23k 2/15, improving.   E-Coli UTI - 100k E-Coli with sensitivities pending  Plan:  Foley replaced 2/15 Await UC prior to d/c Zosyn D3/x  PE, LLL DVT (01/2015) on Coumadin  Anemia   Plan: Coumadin per pharmacy   Trend INR  Trend CBC   S/P PEG (12/2014) Protein Calorie Malnutrition   Plan: Continue TF  Nutrition consult  PEG care per protocol   Deconditioning, bed bound Hx CVA / ICH Chronic Pain - ? Difficult to assess, on fentanyl patch, pt denies pain  Plan: Chair position as tolerated  Continue home keppra  Resume fentanyl patch at 12.5 mcg, recommend taper to off    HTN Tachycardia    Plan: Resume home norvasc, clonidine, metoprolol   Stage II Sacral Ulcer - present on admission Unstageable R Heel Ulcer - present SONIA BROMELLPlan: Protective sacral dressing  WOC consult for heel ulcer recomendations  GLOBAL:   No family at bedside am 2/17.  Pending results of UC, will be  able to return to SNF.     Canary Brim, NP-C [facility] Pgr: [phone] or if no answer [phone] 03/18/2015, 1:33 PM  PCCM Attending Note: Patient seen and examined with nurse practitioner. Please refer to her progress note which I reviewed in detail. Patient with improving interaction. Still not answering questions appropriately for me.  BP 126/83 mmHg  Pulse 95  Temp(Src) 99.5 F (37.5 C) (Oral)  Resp 18  Ht  (1.651 m)  Wt 162 lb 0.6 oz (73.5 kg)  BMI 26.96 kg/m2  SpO2 100%  LMP  (LMP Unknown) Gen.: Sitting up in bed. No distress. Appears comfortable. Pulmonary:  Normal work of breathing on room air. Symmetric chest rise. Neurological: Patient tracks to voice. Attempts to answer questions. Smiling.  CBC Latest Ref Rng 03/17/2015 03/16/2015 03/15/2015  WBC 4.0 - 10.5 K/uL 19.7(H) 23.4(H) 21.2(H)  Hemoglobin 12.0 - 15.0 g/dL 10.9(L) 11.3(L) 12.1  Hematocrit 36.0 - 46.0 % 34.6(L) 36.8 39.4  Platelets 150 - 400 K/uL 309 414(H) 393   BMP Latest Ref Rng 03/17/2015 03/16/2015 03/15/2015  Glucose 65 - 99 mg/dL 454(U) 981(X) 914(N)  BUN 6 - 20 mg/dL Creatinine 0.44 - 1.00 mg/dL 8.29 5.62 1.30  Sodium 135 - 145 mmol/L 135 134(L) 135  Potassium 3.5 - 5.1 mmol/L 3.5 3.8 4.2  Chloride 101 - 111 mmol/L 100(L) 99(L) 97(L)  CO2 22 - 32 mmol/L Calcium 8.9 - 10.3 mg/dL 8.6(V) 9.3 9.107    A/P: 50 year old female status post CVA/ICH and tracheostomy. Status post tracheostomy dislodgment. Her respiratory status continues to remain stable. Awaiting finalization of urine culture before discharge back to skilled nursing facility given potential for resistant organisms.  1. Status post tracheostomy: Placed for prolonged intubation from intracranial hemorrhage. Plan to leave tracheostomy out for now.  2. Probable UTI: Empiric Zosyn. Foley changed 2/15. Awaiting culture results.  3. PE/left lower extremity DVT: Coumadin per pharmacy protocol. 4. CVA/ICH: Continuing Keppra. 5. Hyperglycemia: Continuing accu-checks q4hr w/ SSI per algorithm. 6. Diet: Continuing tube feeds via PEG. 7. Disposition: D/C patient back to SNF once urine culture results.  Donna Christen Jamison Neighbor, M.D. [facility] Pager: [phone] After 3pm or if no response, call [phone]

## 2015-03-18 NOTE — Progress Notes (Signed)
ANTICOAGULATION CONSULT NOTE   Pharmacy Consult for Warfarin Indication: DVT/PE  No Known Allergies  Patient Measurements: Height:  (165.1 cm) Weight: 162 lb 0.6 oz (73.5 kg) IBW/kg (Calculated) : 57  Vital Signs: Temp: 98.8 F (37.1 C) (02/17 0618) Temp Source: Axillary (02/17 0618) BP: 122/72 mmHg (02/17 0618) Pulse Rate: 101 (02/17 0618)  Labs:  Recent Labs  03/16/15 0314 03/16/15 1610 03/17/15 0306 03/18/15 0354  HGB 11.3*  --  10.9*  --   HCT 36.8  --  34.6*  --   PLT 414*  --  309  --   LABPROT 20.8*  --  21.4* 21.6*  INR 1.80*  --  1.86* 1.89*  CREATININE  --  0.51 0.49  --     Estimated Creatinine Clearance: 83.5 mL/min (by C-G formula based on Cr of 0.49).   Medications:  Scheduled:  . amLODipine  10 mg Per Tube Daily  . antiseptic oral rinse  7 mL Mouth Rinse q12n4p  . bethanechol  10 mg Per Tube 3 times per day  . chlorhexidine  15 mL Mouth Rinse BID  . cloNIDine  0.1 mg Per Tube 3 times per day  . fentaNYL  12.5 mcg Transdermal Q72H  . insulin aspart  0-9 Units Subcutaneous 6 times per day  . levETIRAcetam  500 mg Per Tube BID  . metoprolol tartrate  125 mg Per Tube BID  . piperacillin-tazobactam (ZOSYN)  IV  3.375 g Intravenous 3 times per day  . Warfarin - Pharmacist Dosing Inpatient   Does not apply q1800   Infusions:  . feeding supplement (JEVITY 1.2 CAL) 1,000 mL (03/18/15 0025)  . 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  She is s/p tracheostomy, PEG tube and chronic Foley, discharged on 02/14/2015 to SNF. Pt was admitted in 01/2015 for resp failure 2 to saddle embolus/ LLE DVT for which she was started on warfarin. She was discharged on 02/22/15 to SNF.  She was readmitted 2/14 after pulling out her trach.  Warfarin was initially held until plans were determined for replacing  trach.  PTA dose of warfarin reported as  daily, INR subtherapeutic (1.5) on admit.  Since patient is in no distress with trach out, PCCM will hold off on replacement.  Pharmacy has been consulted to resume warfarin dosing.  Today, 03/18/2015:  INR subtherapeutic (1.89) and not moving much - likely due to resuming TF  CBC: Hgb relatively stable and Pltc WNL (2/16)  TF resumed 2/14   No major drug interactions   Goal of Therapy:  INR 2-3   Plan:   Boost Warfarin slightly to  PO x 1 today   Daily PT/INR   Monitor closely for signs/symptoms of bleeding  Loralee Pacas, PharmD, BCPS Pager: [phone] 03/18/2015 10:22 AM

## 2015-03-18 NOTE — Progress Notes (Signed)
Pharmacy Antibiotic Note  Rachel Vang is a 50 y.o. female admitted on 03/14/2015 with possible UTI.  Pharmacy has been consulted for piperacillin/tazobactam dosing. She came to hospital for dislodged tracheostomy.  She is bedbound following ICH.  Recently hospitalized for sepsis with  E. Coli and enterobacter in urine cx.  Has urinary catheter.  Afebrile since 2/15, WBC elevated but improving, renal function stable.  Plan:  Zosyn 3.375gm IV q8h over 4h infusion  F/u final urine cultures and de-escalate as appropriate  Height:  (165.1 cm) Weight: 162 lb 0.6 oz (73.5 kg) IBW/kg (Calculated) : 57  Temp (24hrs), Avg:98.7 F (37.1 C), Min:98.2 F (36.8 C), Max:98.9 F (37.2 C)   Recent Labs Lab 03/14/15 1750 03/15/15 0328 03/16/15 0314 03/16/15 0633 03/17/15 0306  WBC 20.1* 21.2* 23.4*  --  19.7*  CREATININE 0.46 0.50  --  0.51 0.49    Estimated Creatinine Clearance: 83.5 mL/min (by C-G formula based on Cr of 0.49).    No Known Allergies  Antimicrobials this admission: 2/15 >> pip/tazo  Dose adjustments this admission:   Microbiology results: 1/19 urine: E. Coli (pan-sens), E. Cloacae (R to cefazolin, I to NTF) 1/26 respiratory: E. Cloacae (R to cefazolin only) 2/15 urine: >100k colonies GNR (re-incubated for better growth)  Thank you for allowing pharmacy to be a part of this patient's care.  Loralee Pacas, PharmD, BCPS Pager: [phone]  03/18/2015 10:27 AM

## 2015-03-19 DIAGNOSIS — N390 Urinary tract infection, site not specified: Secondary | ICD-10-CM | POA: Insufficient documentation

## 2015-03-19 DIAGNOSIS — R400 Somnolence: Secondary | ICD-10-CM | POA: Insufficient documentation

## 2015-03-19 LAB — URINE CULTURE: Culture: >=100000

## 2015-03-19 LAB — GLUCOSE, CAPILLARY
GLUCOSE-CAPILLARY: 181 mg/dL — AB (ref 65–99)
GLUCOSE-CAPILLARY: 159 mg/dL — AB (ref 65–99)
GLUCOSE-CAPILLARY: 141 mg/dL — AB (ref 65–99)
Glucose-Capillary: 154 mg/dL — ABNORMAL HIGH (ref 65–99)
Glucose-Capillary: 155 mg/dL — ABNORMAL HIGH (ref 65–99)
Glucose-Capillary: 157 mg/dL — ABNORMAL HIGH (ref 65–99)
Glucose-Capillary: 177 mg/dL — ABNORMAL HIGH (ref 65–99)

## 2015-03-19 LAB — PROTIME-INR
INR: 2.12 — AB (ref 0.00–1.49)
PROTHROMBIN TIME: 23.6 s — AB (ref 11.6–15.2)

## 2015-03-19 LAB — BLOOD GAS, ARTERIAL
ACID-BASE EXCESS: 0.8 mmol/L (ref 0.0–2.0)
BICARBONATE: 24.1 meq/L — AB (ref 20.0–24.0)
FIO2: 0.21
O2 Saturation: 96.7 %
PCO2 ART: 35.3 mmHg (ref 35.0–45.0)
PH ART: 7.449 (ref 7.350–7.450)
PO2 ART: 90.4 mmHg (ref 80.0–100.0)
Patient temperature: 98.6
TCO2: 22.2 mmol/L (ref 0–100)

## 2015-03-19 MED ORDER — WARFARIN SODIUM 4 MG PO TABS
8.0000 mg | ORAL_TABLET | Freq: Once | ORAL | Status: AC
  Administered 2015-03-19: 8 mg via ORAL
  Filled 2015-03-19: qty 2

## 2015-03-19 MED ORDER — LIP MEDEX EX OINT
TOPICAL_OINTMENT | CUTANEOUS | Status: AC
  Administered 2015-03-19: 20:00:00
  Filled 2015-03-19: qty 7

## 2015-03-19 NOTE — Progress Notes (Signed)
ANTICOAGULATION CONSULT NOTE   Pharmacy Consult for Warfarin Indication: DVT/PE  No Known Allergies  Patient Measurements: Height:  (165.1 cm) Weight: 161 lb 2.5 oz (73.1 kg) IBW/kg (Calculated) : 57  Vital Signs: Temp: 97.4 F (36.3 C) (02/18 1427) Temp Source: Axillary (02/18 1427) BP: 105/76 mmHg (02/18 1427) Pulse Rate: 94 (02/18 1427)  Labs:  Recent Labs  03/17/15 0306 03/18/15 0354 03/19/15 0430  HGB 10.9*  --   --   HCT 34.6*  --   --   PLT 309  --   --   LABPROT 21.4* 21.6* 23.6*  INR 1.86* 1.89* 2.12*  CREATININE 0.49  --   --     Estimated Creatinine Clearance: 83.3 mL/min (by C-G formula based on Cr of 0.49).   Medications:  Scheduled:  . amLODipine  10 mg Per Tube Daily  . antiseptic oral rinse  7 mL Mouth Rinse q12n4p  . bethanechol  10 mg Per Tube 3 times per day  . chlorhexidine  15 mL Mouth Rinse BID  . cloNIDine  0.1 mg Per Tube 3 times per day  . insulin aspart  0-9 Units Subcutaneous 6 times per day  . levETIRAcetam  500 mg Per Tube BID  . metoprolol tartrate  125 mg Per Tube BID  . piperacillin-tazobactam (ZOSYN)  IV  3.375 g Intravenous 3 times per day  . 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  She was admitted in 01/2015 for resp failure 2 to saddle embolus/ LLE DVT for which she was started on warfarin. She was discharged on 02/22/15 to SNF. She was readmitted 2/14 after pulling out her trach.  Warfarin was initially held until plans were determined for replacing trach.  PTA dose of warfarin reported as  daily, INR subtherapeutic (1.5) on admit.  Since patient is in no distress with trach out, PCCM will hold off on replacement.  Pharmacy has been consulted to resume warfarin dosing.  Today, 03/19/2015:  INR 2.12, increased to therapeutic range.  Required boosted dose,  likely due to TF.  CBC: Hgb relatively stable and Pltc WNL (2/16)  TF resumed 2/14   No major drug interactions   Goal of Therapy:  INR 2-3   Plan:   Warfarin  PO x 1 today at 1800  Daily PT/INR   Monitor closely for signs/symptoms of bleeding   Lynann Beaver PharmD, BCPS Pager [phone] 03/19/2015 2:50 PM

## 2015-03-19 NOTE — Progress Notes (Signed)
Utilization review completed.  

## 2015-03-19 NOTE — Progress Notes (Signed)
Name: Rachel Vang MRN: [id number] DOB: Jan 05, 1966    ADMISSION DATE:  03/14/2015   CHIEF COMPLAINT:  Tracheostomy dislodgement   SUBJECTIVE:    Hypersomnolence this am  VITAL SIGNS: Temp:  [97.9 F (36.6 C)-99.9 F (37.7 C)] 97.9 F (36.6 C) (02/18 0501) Pulse Rate:  [95-107] 107 (02/18 0501) Resp:  [18-20] 18 (02/18 0501) BP: (124-145)/(66-94) 145/94 mmHg (02/18 0501) SpO2:  [100 %] 100 % (02/18 0501)  PHYSICAL EXAMINATION: General: chronically ill appearing female, asleep HEENT: stoma is closed Neuro: asleep, difficult to wake but opened eyes, not interacting CV: s1s2 rrr, no m/r/g PULM: even/non-labored, lungs bilaterally clear, moving good air, snoring respirations, no witnessed apneas GI: obese, soft, bsx4 active  Extremities: warm/dry, no edema     Recent Labs Lab 03/15/15 0328 03/16/15 0633 03/17/15 0306  NA 135 134* 135  K 4.2 3.8 3.5  CL 97* 99* 100*  CO2 BUN CREATININE 0.50 0.51 0.49  GLUCOSE 166* 213* 193*    Recent Labs Lab 03/15/15 0328 03/16/15 0314 03/17/15 0306  HGB 12.1 11.3* 10.9*  HCT 39.4 36.8 34.6*  WBC 21.2* 23.4* 19.7*  PLT 393 414* 309   No results found.  ASSESSMENT / PLAN:  DISCUSSION:  72F, chronically bed bound secondary to IC bleed (12/2014), with complicated hospitalization in 12/2014. Pt also with PE and LLL DVT in 01/2015 admission on coumadin.  Discharged with a trach to SNF and she pulled it out 2/14. Also, has a PEG. No signs and sx of infections.  Apparently, pt was O2 dependent at SNF. At the ED, her O2 sats were 97-99 % on RA. Unclear if she has baseline OSA. Trach left out, following    S/P Tracheostomy for prolonged intubation / IC bleed (12/2014) S/P Accidental removal of trach (03/14/15) by patient Unclear if baseline OSA  Plan: [name] out since 2/13 without any distress, remains on room air.  No secretions.  Hypersomnolent am 2/18, ? Whether she is ventilating adequately. Probably  not a BiPAP candidate due to airway protection. Would consider trach replacement at some point if she is hypercapneic Pulmonary hygiene as able - upright positioning, mobilize Aspiration precautions    Leukocytosis - admit WBC 20k, rise to 23k 2/15, improving.   E-Coli UTI - 100k E-Coli with sensitivities pending  Plan:  Foley replaced 2/15 Await UC prior to d/c Zosyn D4/x Recheck WBC on 2/19  PE, LLL DVT (01/2015) on Coumadin  Anemia   Plan: Coumadin per pharmacy   Trend INR  Trend CBC   S/P PEG (12/2014) Protein Calorie Malnutrition   Plan: Continue TF  Nutrition consult  PEG care per protocol   Deconditioning, bed bound Hx CVA / ICH Chronic Pain - ? Difficult to assess, on fentanyl patch, pt denies pain  Plan: Chair position as tolerated  Continue home keppra  Fentanyl patch at 12.5 mcg > remove 2/18   HTN Tachycardia    Plan: Home norvasc, clonidine, metoprolol   Stage II Sacral Ulcer - present on admission Unstageable R Heel Ulcer - present on admission   Plan: Protective sacral dressing  WOC consult for heel ulcer recomendations  GLOBAL:   Discussed with son at bedside 2/18. Concerned that she may be hypercapneic. I will d/c fentanyl patch, check ABG. Will have to decide what this means w regard to possible trach-redo.   Levy Pupa, MD, PhD 03/19/2015, 10:28 AM [facility] [phone] or if no answer  [phone]  

## 2015-03-20 DIAGNOSIS — L8990 Pressure ulcer of unspecified site, unspecified stage: Secondary | ICD-10-CM

## 2015-03-20 LAB — CBC
HCT: 32 % — ABNORMAL LOW (ref 36.0–46.0)
Hemoglobin: 10 g/dL — ABNORMAL LOW (ref 12.0–15.0)
MCH: 29 pg (ref 26.0–34.0)
MCHC: 31.3 g/dL (ref 30.0–36.0)
MCV: 92.8 fL (ref 78.0–100.0)
PLATELETS: 312 10*3/uL (ref 150–400)
RBC: 3.45 MIL/uL — AB (ref 3.87–5.11)
RDW: 16.2 % — AB (ref 11.5–15.5)
WBC: 15.1 10*3/uL — AB (ref 4.0–10.5)

## 2015-03-20 LAB — GLUCOSE, CAPILLARY
GLUCOSE-CAPILLARY: 158 mg/dL — AB (ref 65–99)
Glucose-Capillary: 149 mg/dL — ABNORMAL HIGH (ref 65–99)
Glucose-Capillary: 174 mg/dL — ABNORMAL HIGH (ref 65–99)
Glucose-Capillary: 106 mg/dL — ABNORMAL HIGH (ref 65–99)
Glucose-Capillary: 149 mg/dL — ABNORMAL HIGH (ref 65–99)
Glucose-Capillary: 189 mg/dL — ABNORMAL HIGH (ref 65–99)

## 2015-03-20 LAB — PROTIME-INR
INR: 2.15 — AB (ref 0.00–1.49)
Prothrombin Time: 23.8 seconds — ABNORMAL HIGH (ref 11.6–15.2)

## 2015-03-20 LAB — C DIFFICILE QUICK SCREEN W PCR REFLEX
C DIFFICLE (CDIFF) ANTIGEN: POSITIVE — AB
C Diff interpretation: POSITIVE
C Diff toxin: POSITIVE — AB

## 2015-03-20 MED ORDER — VANCOMYCIN 50 MG/ML ORAL SOLUTION: 125.0000 mg | Freq: Every day | ORAL | Status: DC

## 2015-03-20 MED ORDER — WARFARIN SODIUM 6 MG PO TABS
6.0000 mg | ORAL_TABLET | Freq: Once | ORAL | Status: AC
  Administered 2015-03-20: 6 mg via ORAL
  Filled 2015-03-20: qty 1

## 2015-03-20 MED ORDER — CIPROFLOXACIN IN D5W 400 MG/200ML IV SOLN
400.0000 mg | Freq: Two times a day (BID) | INTRAVENOUS | Status: DC
  Administered 2015-03-20 – 2015-03-22 (×5): 400 mg via INTRAVENOUS
  Filled 2015-03-20 (×6): qty 200

## 2015-03-20 MED ORDER — VANCOMYCIN 50 MG/ML ORAL SOLUTION: 125.0000 mg | ORAL | Status: DC

## 2015-03-20 MED ORDER — VANCOMYCIN 50 MG/ML ORAL SOLUTION
125.0000 mg | Freq: Four times a day (QID) | ORAL | Status: DC
  Administered 2015-03-20 – 2015-03-22 (×6): 125 mg via ORAL
  Filled 2015-03-20 (×9): qty 2.5

## 2015-03-20 MED ORDER — VANCOMYCIN 50 MG/ML ORAL SOLUTION: 125.0000 mg | Freq: Two times a day (BID) | ORAL | Status: DC

## 2015-03-20 MED ORDER — VANCOMYCIN 50 MG/ML ORAL SOLUTION: 125.0000 mg | Freq: Four times a day (QID) | ORAL | Status: DC

## 2015-03-20 NOTE — Progress Notes (Signed)
Name: Rachel Vang MRN: [id number] DOB: [birth year]    ADMISSION DATE:  03/14/2015   CHIEF COMPLAINT:  Tracheostomy dislodgement   SUBJECTIVE:    ABG from 2/18 reassuring Easier to wake this am Note new cx data, Enterobacter in urine in addition to E coli  VITAL SIGNS: Temp:  [97.4 F (36.3 C)-99.3 F (37.4 C)] 98.8 F (37.1 C) (02/19 0532) Pulse Rate:  [92-102] 92 (02/19 0532) Resp:  [15-18] 16 (02/19 0532) BP: (105-150)/(76-82) 133/81 mmHg (02/19 0532) SpO2:  [99 %-100 %] 100 % (02/19 0532) Weight:  [71.9 kg (158 lb 8.2 oz)-73.1 kg (161 lb 2.5 oz)] 71.9 kg (158 lb 8.2 oz) (02/19 0500)  PHYSICAL EXAMINATION: General: chronically ill appearing female, asleep HEENT: stoma is closed Neuro: asleep, easier to wake 2/19 am but she will not speak CV: s1s2 rrr, no m/r/g PULM: even/non-labored, lungs bilaterally clear, moving good air, no witnessed apneas GI: obese, soft, bsx4 active  Extremities: warm/dry, no edema     Recent Labs Lab 03/15/15 0328 03/16/15 0633 03/17/15 0306  NA 135 134* 135  K 4.2 3.8 3.5  CL 97* 99* 100*  CO2 BUN CREATININE 0.50 0.51 0.49  GLUCOSE 166* 213* 193*    Recent Labs Lab 03/16/15 0314 03/17/15 0306 03/20/15 0507  HGB 11.3* 10.9* 10.0*  HCT 36.8 34.6* 32.0*  WBC 23.4* 19.7* 15.1*  PLT 414* 309 312    Recent Labs Lab 03/14/15 1800 03/19/15 1103  PHART 7.507* 7.449  PCO2ART 35.6 35.3  PO2ART 78.4* 90.4  HCO3 28.0* 24.1*  TCO2 24.2 22.2  O2SAT 95.8 96.7    No results found.  ASSESSMENT / PLAN:  DISCUSSION:  30F, chronically bed bound secondary to IC bleed (12/2014), with complicated hospitalization in 12/2014. Pt also with PE and LLL DVT in 01/2015 admission on coumadin.  Discharged with a trach to SNF and she pulled it out 2/14. Also, has a PEG. No signs and sx of infections.  Apparently, pt was O2 dependent at SNF. At the ED, her O2 sats were 97-99 % on RA. Unclear if she has baseline OSA. Trach  left out, following   S/P Tracheostomy for prolonged intubation / IC bleed (12/2014) S/P Accidental removal of trach (03/14/15) by patient Unclear if baseline OSA  Plan: [name] out since 2/13 without any distress, remains on room air.  No secretions.  Hypersomnolent am 2/18, but ABG reassuring and improved some 2/19 am. Seems reasonable to continue to observe and avoid trach Pulmonary hygiene as able - upright positioning, mobilize Aspiration precautions    Leukocytosis - admit WBC 20k, rise to 23k 2/15, improving.   E-Coli UTI - 100k E-Coli >> pan sensitive Enterobacter UTI, new data as of 2/19, R to zosyn  Plan:  Foley replaced 2/15 Stop Zosyn 2/19 as enterobacter not covered; cipro should cover both organisms, will start 2/19 Follow WBC intermittently  PE, LLL DVT (01/2015) on Coumadin  Anemia   Plan: Coumadin per pharmacy   Trend INR  Trend CBC   S/P PEG (12/2014) Protein Calorie Malnutrition   Plan: Continue TF  Nutrition consult  PEG care per protocol   Deconditioning, bed bound Hx CVA / ICH Chronic Pain - ? Difficult to assess, on fentanyl patch, pt denies pain  Plan: Chair position as tolerated  Continue home keppra  Fentanyl patch at 12.5 mcg > removed 2/18, would d/c altogether on transition back to SNF   HTN Tachycardia  Plan: Home norvasc, clonidine, metoprolol   Stage II Sacral Ulcer - present on admission Unstageable R Heel Ulcer - present on admission   Plan: Protective sacral dressing  WOC consult for heel ulcer recomendations  GLOBAL:   Discussed with son at bedside 2/18. Change abx to cipro on 2/19. Back to SNF without trach as long as stable.    Levy Pupa, MD, PhD 03/20/2015, 7:44 AM [facility] [phone] or if no answer [phone]

## 2015-03-20 NOTE — Progress Notes (Signed)
ANTICOAGULATION CONSULT NOTE   Pharmacy Consult for Warfarin Indication: DVT/PE  No Known Allergies  Patient Measurements: Height:  (165.1 cm) Weight: 158 lb 8.2 oz (71.9 kg) IBW/kg (Calculated) : 57  Vital Signs: Temp: 98.8 F (37.1 C) (02/19 0532) Temp Source: Oral (02/19 0532) BP: 133/81 mmHg (02/19 0532) Pulse Rate: 92 (02/19 0532)  Labs:  Recent Labs  03/18/15 0354 03/19/15 0430 03/20/15 0507  HGB  --   --  10.0*  HCT  --   --  32.0*  PLT  --   --  312  LABPROT 21.6* 23.6* 23.8*  INR 1.89* 2.12* 2.15*    Estimated Creatinine Clearance: 82.7 mL/min (by C-G formula based on Cr of 0.49).   Medications:  Scheduled:  . amLODipine  10 mg Per Tube Daily  . antiseptic oral rinse  7 mL Mouth Rinse q12n4p  . bethanechol  10 mg Per Tube 3 times per day  . chlorhexidine  15 mL Mouth Rinse BID  . ciprofloxacin  400 mg Intravenous Q12H  . cloNIDine  0.1 mg Per Tube 3 times per day  . insulin aspart  0-9 Units Subcutaneous 6 times per day  . levETIRAcetam  500 mg Per Tube BID  . metoprolol tartrate  125 mg Per Tube BID  . 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  She was admitted in 01/2015 for resp failure 2 to saddle embolus/ LLE DVT for which she was started on warfarin. She was discharged on 02/22/15 to SNF. She was readmitted 2/14 after pulling out her trach.  Warfarin was initially held until plans were determined for replacing trach.  PTA dose of warfarin reported as  daily, INR subtherapeutic (1.5) on admit.  Since patient is in no distress with trach out, PCCM will hold off on replacement.  Pharmacy has been consulted to resume warfarin dosing.  Today, 03/20/2015:  INR 2.15, remains therapeutic  CBC: Hgb decreased but relatively stable and Pltc WNL (2/19)  Diet:  TF resumed 2/14   Drug-drug  interactions: Tube feeds may decrease warfarin absorption.  Cipro (started 2/19) will likely increase INR; warfarin dose will be decreased in anticipation of this interaction.  Note, when cipro is stopped, dose should be re-adjusted.    Goal of Therapy:  INR 2-3   Plan:   Warfarin  PO x 1 today at 1800  Daily PT/INR   Monitor closely for signs/symptoms of bleeding  For discharge, recommend warfarin  daily (reduced dose d/t cipro interaction) with close INR f/u in 3 days.   Lynann Beaver PharmD, BCPS Pager [phone] 03/20/2015 8:03 AM

## 2015-03-21 DIAGNOSIS — A047 Enterocolitis due to Clostridium difficile: Secondary | ICD-10-CM

## 2015-03-21 LAB — BASIC METABOLIC PANEL
ANION GAP: 9 (ref 5–15)
BUN: 9 mg/dL (ref 6–20)
CO2: 27 mmol/L (ref 22–32)
Calcium: 9.1 mg/dL (ref 8.9–10.3)
Chloride: 103 mmol/L (ref 101–111)
Creatinine, Ser: 0.49 mg/dL (ref 0.44–1.00)
Glucose, Bld: 161 mg/dL — ABNORMAL HIGH (ref 65–99)
POTASSIUM: 3.7 mmol/L (ref 3.5–5.1)
SODIUM: 139 mmol/L (ref 135–145)

## 2015-03-21 LAB — PROTIME-INR
INR: 2.03 — AB (ref 0.00–1.49)
Prothrombin Time: 22.8 seconds — ABNORMAL HIGH (ref 11.6–15.2)

## 2015-03-21 LAB — GLUCOSE, CAPILLARY
GLUCOSE-CAPILLARY: 152 mg/dL — AB (ref 65–99)
Glucose-Capillary: 134 mg/dL — ABNORMAL HIGH (ref 65–99)
Glucose-Capillary: 144 mg/dL — ABNORMAL HIGH (ref 65–99)

## 2015-03-21 MED ORDER — WARFARIN SODIUM 6 MG PO TABS
6.0000 mg | ORAL_TABLET | Freq: Once | ORAL | Status: AC
  Administered 2015-03-21: 6 mg via ORAL
  Filled 2015-03-21: qty 1

## 2015-03-21 NOTE — Care Management Note (Signed)
Case Management Note  Patient Details  Name: Rachel Vang MRN: [id number] Date of Birth: 1965-09-05  Subjective/Objective:  Admitted post Tracheostomy dislodgement at SNF                Action/Plan: Discharge planning per CSW  Expected Discharge Date:   (unknown)               Expected Discharge Plan:  Skilled Nursing Facility  In-House Referral:  NA  Discharge planning Services  CM Consult  Post Acute Care Choice:  NA Choice offered to:  NA  DME Arranged:  N/A DME Agency:  NA  HH Arranged:  NA HH Agency:  NA  Status of Service:  Completed, signed off  Medicare Important Message Given:    Date Medicare IM Given:    Medicare IM give by:    Date Additional Medicare IM Given:    Additional Medicare Important Message give by:     If discussed at Long Length of Stay Meetings, dates discussed:    Additional Comments:  Alexis Goodell, RN 03/21/2015, 10:35 AM [phone]

## 2015-03-21 NOTE — Progress Notes (Signed)
Name: Rachel Vang MRN: [id number] DOB: 03-Sep-1965    ADMISSION DATE:  03/14/2015   CHIEF COMPLAINT:  Tracheostomy dislodgement   SUBJECTIVE:    No acute events Has c diff  VITAL SIGNS: Temp:  [98.3 F (36.8 C)-98.7 F (37.1 C)] 98.3 F (36.8 C) (02/20 1442) Pulse Rate:  [92-100] 98 (02/20 1442) Resp:  [15-18] 15 (02/20 1442) BP: (118-141)/(69-93) 124/83 mmHg (02/20 1442) SpO2:  [100 %] 100 % (02/20 1442) Weight:  [68.7 kg (151 lb 7.3 oz)] 68.7 kg (151 lb 7.3 oz) (02/20 0500)  PHYSICAL EXAMINATION: General: chronically ill appearing female, awake, non-verbal PULM: CTA B HENT: Trach dressing c/d/i PULM: CTA B CV: RRR, no mgr GI: BS+, soft, nontender MSK: normal bulk and tone Neuro: awake, tracks, moves spontaneously but doesn't talk to me   Recent Labs Lab 03/15/15 0328 03/16/15 0633 03/17/15 0306  NA 135 134* 135  K 4.2 3.8 3.5  CL 97* 99* 100*  CO2 BUN CREATININE 0.50 0.51 0.49  GLUCOSE 166* 213* 193*    Recent Labs Lab 03/16/15 0314 03/17/15 0306 03/20/15 0507  HGB 11.3* 10.9* 10.0*  HCT 36.8 34.6* 32.0*  WBC 23.4* 19.7* 15.1*  PLT 414* 309 312    Recent Labs Lab 03/14/15 1800 03/19/15 1103  PHART 7.507* 7.449  PCO2ART 35.6 35.3  PO2ART 78.4* 90.4  HCO3 28.0* 24.1*  TCO2 24.2 22.2  O2SAT 95.8 96.7    No results found.  ASSESSMENT / PLAN:  DISCUSSION:  70F, chronically bed bound secondary to IC bleed (12/2014), with complicated hospitalization in 12/2014. Pt also with PE and LLL DVT in 01/2015 admission on coumadin.  Discharged with a trach to SNF and she pulled it out 2/14. Also, has a PEG. No signs and sx of infections.  Apparently, pt was O2 dependent at SNF. At the ED, her O2 sats were 97-99 % on RA. Unclear if she has baseline OSA. Trach left out, following and now doing OK without it.  Now has C diff  C diff, new problem. 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  No secretions.  Hypersomnolent am 2/18, but ABG reassuring and improved some 2/19 am. Seems reasonable to continue to observe and avoid trach Pulmonary hygiene as able - upright positioning, mobilize Aspiration precautions    Leukocytosis - admit WBC 20k, rise to 23k 2/15, improving.   E-Coli UTI - 100k E-Coli >> pan sensitive Enterobacter UTI, new data as of 2/19, R to zosyn  Plan:  Foley replaced 2/15 Continue cipro for 7 day course Follow WBC intermittently  PE, LLL DVT (01/2015) on Coumadin  Anemia   Plan: Coumadin per pharmacy   Trend INR  Trend CBC   S/P PEG (12/2014) Protein Calorie Malnutrition   Plan: Continue TF  Nutrition consult  PEG care per protocol   Deconditioning, bed bound Hx CVA / ICH Chronic Pain - ? Difficult to assess, on fentanyl patch, pt denies pain  Plan: Chair position as tolerated  Continue home keppra  Fentanyl patch at 12.5 mcg > removed 2/18, would d/c altogether on transition back to SNF   HTN Tachycardia  Plan: Home norvasc, clonidine, metoprolol   Stage II Sacral Ulcer - present on admission Unstageable R Heel Ulcer - present on admission   Plan: Protective sacral dressing  WOC consult for heel ulcer recomendations  GLOBAL:   No family bedside 2/20   Heber [place], MD [place] [facility] Pager: [phone] Cell: [phone] After 3pm or if no response, call [phone]

## 2015-03-21 NOTE — Progress Notes (Signed)
ANTICOAGULATION CONSULT NOTE   Pharmacy Consult for Warfarin Indication: DVT/PE  No Known Allergies  Patient Measurements: Height:  (165.1 cm) Weight: 151 lb 7.3 oz (68.7 kg) IBW/kg (Calculated) : 57  Vital Signs: Temp: 98.5 F (36.9 C) (02/20 0526) Temp Source: Axillary (02/20 0526) BP: 128/88 mmHg (02/20 0526) Pulse Rate: 100 (02/19 2227)  Labs:  Recent Labs  03/19/15 0430 03/20/15 0507 03/21/15 0415  HGB  --  10.0*  --   HCT  --  32.0*  --   PLT  --  312  --   LABPROT 23.6* 23.8* 22.8*  INR 2.12* 2.15* 2.03*    Estimated Creatinine Clearance: 81 mL/min (by C-G formula based on Cr of 0.49).   Medications:  Scheduled:  . amLODipine  10 mg Per Tube Daily  . antiseptic oral rinse  7 mL Mouth Rinse q12n4p  . bethanechol  10 mg Per Tube 3 times per day  . chlorhexidine  15 mL Mouth Rinse BID  . ciprofloxacin  400 mg Intravenous Q12H  . cloNIDine  0.1 mg Per Tube 3 times per day  . insulin aspart  0-9 Units Subcutaneous 6 times per day  . levETIRAcetam  500 mg Per Tube BID  . metoprolol tartrate  125 mg Per Tube BID  . vancomycin  125 mg Oral QID   Followed by  . [START ON 04/03/2015] vancomycin  125 mg Oral BID   Followed by  . [START ON 04/11/2015] vancomycin  125 mg Oral Daily   Followed by  . [START ON 04/18/2015] vancomycin  125 mg Oral QODAY   Followed by  . [START ON 04/26/2015] vancomycin  125 mg Oral Q3 days  . 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  She was admitted in 01/2015 for resp failure 2 to saddle embolus/ LLE DVT for which she was started on warfarin. She was discharged on 02/22/15 to SNF. She was readmitted 2/14 after pulling out her trach.  Warfarin was initially held until plans were determined for replacing trach.  PTA dose of warfarin reported as  daily, INR subtherapeutic  (1.5) on admit.  Since patient is in no distress with trach out, PCCM will hold off on replacement.  Pharmacy has been consulted to resume warfarin dosing.  Today, 03/21/2015:  INR 2.03, remains therapeutic  CBC: Hgb decreased but relatively stable and Pltc WNL (2/19)  Diet:  TF resumed 2/14   Drug-drug interactions: Tube feeds may decrease warfarin absorption.  Cipro (started 2/19) will likely increase INR; warfarin dose will be decreased in anticipation of this interaction.  Note, when cipro is stopped, dose should be re-adjusted.    Goal of Therapy:  INR 2-3   Plan:   Warfarin  PO x 1 today at 1800  Daily PT/INR   Monitor closely for signs/symptoms of bleeding  For discharge, recommend warfarin  daily (reduced dose d/t cipro interaction) with close INR f/u in 3 days.   Arley Phenix RPh 03/21/2015, 7:25 AM Pager [phone]

## 2015-03-22 LAB — BASIC METABOLIC PANEL
Anion gap: 10 (ref 5–15)
BUN: 9 mg/dL (ref 6–20)
CHLORIDE: 103 mmol/L (ref 101–111)
CO2: 25 mmol/L (ref 22–32)
CREATININE: 0.48 mg/dL (ref 0.44–1.00)
Calcium: 9.5 mg/dL (ref 8.9–10.3)
GFR calc Af Amer: >60 mL/min (ref >60)
GFR calc non Af Amer: >60 mL/min (ref >60)
Glucose, Bld: 145 mg/dL — ABNORMAL HIGH (ref 65–99)
Potassium: 4.1 mmol/L (ref 3.5–5.1)
SODIUM: 138 mmol/L (ref 135–145)

## 2015-03-22 LAB — GLUCOSE, CAPILLARY
GLUCOSE-CAPILLARY: 137 mg/dL — AB (ref 65–99)
GLUCOSE-CAPILLARY: 158 mg/dL — AB (ref 65–99)
GLUCOSE-CAPILLARY: 219 mg/dL — AB (ref 65–99)
Glucose-Capillary: 137 mg/dL — ABNORMAL HIGH (ref 65–99)
Glucose-Capillary: 151 mg/dL — ABNORMAL HIGH (ref 65–99)
Glucose-Capillary: 212 mg/dL — ABNORMAL HIGH (ref 65–99)

## 2015-03-22 LAB — PROTIME-INR
INR: 1.56 — AB (ref 0.00–1.49)
Prothrombin Time: 18.8 seconds — ABNORMAL HIGH (ref 11.6–15.2)

## 2015-03-22 MED ORDER — CIPROFLOXACIN HCL 500 MG PO TABS: 500.0000 mg | ORAL_TABLET | Freq: Two times a day (BID) | ORAL | Status: AC

## 2015-03-22 MED ORDER — WARFARIN SODIUM 4 MG PO TABS
8.0000 mg | ORAL_TABLET | Freq: Once | ORAL | Status: DC
  Filled 2015-03-22: qty 2

## 2015-03-22 MED ORDER — JEVITY 1.2 CAL PO LIQD: 1000.0000 mL | ORAL | Status: DC

## 2015-03-22 MED ORDER — WARFARIN SODIUM 4 MG PO TABS: 8.0000 mg | ORAL_TABLET | Freq: Once | ORAL | Status: DC

## 2015-03-22 MED ORDER — VANCOMYCIN 50 MG/ML ORAL SOLUTION: 125.0000 mg | Freq: Four times a day (QID) | ORAL | Status: DC

## 2015-03-22 MED ORDER — ACETAMINOPHEN 325 MG PO TABS: 650.0000 mg | ORAL_TABLET | ORAL | Status: DC | PRN

## 2015-03-22 MED ORDER — METOPROLOL TARTRATE 25 MG/10 ML ORAL SUSPENSION: 125.0000 mg | Freq: Two times a day (BID) | ORAL | Status: DC

## 2015-03-22 MED ORDER — METOPROLOL TARTRATE 25 MG/10 ML ORAL SUSPENSION
125.0000 mg | Freq: Two times a day (BID) | ORAL | Status: DC
  Administered 2015-03-22: 125 mg
  Filled 2015-03-22 (×2): qty 60

## 2015-03-22 NOTE — Progress Notes (Signed)
ANTICOAGULATION CONSULT NOTE   Pharmacy Consult for Warfarin Indication: DVT/PE  No Known Allergies  Patient Measurements: Height:  (165.1 cm) Weight: 157 lb 3 oz (71.3 kg) IBW/kg (Calculated) : 57  Vital Signs: Temp: 98.7 F (37.1 C) (02/21 0630) Temp Source: Oral (02/21 0630) BP: 153/86 mmHg (02/21 0630) Pulse Rate: 103 (02/21 0630)  Labs:  Recent Labs  03/20/15 0507 03/21/15 0415 03/21/15 1550 03/22/15 0417  HGB 10.0*  --   --   --   HCT 32.0*  --   --   --   PLT 312  --   --   --   LABPROT 23.8* 22.8*  --  18.8*  INR 2.15* 2.03*  --  1.56*  CREATININE  --   --  0.49 0.48    Estimated Creatinine Clearance: 82.3 mL/min (by C-G formula based on Cr of 0.48).   Medications:  Scheduled:  . amLODipine  10 mg Per Tube Daily  . antiseptic oral rinse  7 mL Mouth Rinse q12n4p  . bethanechol  10 mg Per Tube 3 times per day  . chlorhexidine  15 mL Mouth Rinse BID  . ciprofloxacin  400 mg Intravenous Q12H  . cloNIDine  0.1 mg Per Tube 3 times per day  . insulin aspart  0-9 Units Subcutaneous 6 times per day  . levETIRAcetam  500 mg Per Tube BID  . metoprolol tartrate  125 mg Per Tube BID  . vancomycin  125 mg Oral QID   Followed by  . [START ON 04/03/2015] vancomycin  125 mg Oral BID   Followed by  . [START ON 04/11/2015] vancomycin  125 mg Oral Daily   Followed by  . [START ON 04/18/2015] vancomycin  125 mg Oral QODAY   Followed by  . [START ON 04/26/2015] vancomycin  125 mg Oral Q3 days  . 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  She was admitted in 01/2015 for resp failure 2 to saddle embolus/ LLE DVT for which she was started on warfarin. She was discharged on 02/22/15 to SNF. She was readmitted 2/14 after pulling out her trach.  Warfarin was initially held until plans were determined for replacing  trach.  PTA dose of warfarin reported as  daily, INR subtherapeutic (1.5) on admit.  Since patient is in no distress with trach out, PCCM will hold off on replacement.  Pharmacy has been consulted to resume warfarin dosing.  Today, 03/22/2015:  INR 1.56, subtherapeutic  CBC: Hgb decreased but relatively stable and Pltc WNL (2/19)  Diet:  TF resumed 2/14   Drug-drug interactions: Tube feeds may decrease warfarin absorption.  Cipro (started 2/19) will likely increase INR;     Goal of Therapy:  INR 2-3   Plan:   Warfarin  PO x 1 today at 1800  Daily PT/INR   Monitor closely for signs/symptoms of bleeding   recommend close monitoring of INR when discharged due to cipro interaction  Arley Phenix RPh 03/22/2015, 8:30 AM Pager [phone]

## 2015-03-22 NOTE — Progress Notes (Signed)
Report/Update given to Shannon at [facility]. All questions answered. Pt discharged with PTAR in NAD.

## 2015-03-22 NOTE — Progress Notes (Signed)
[facility] is able to admit pt today if stable for d/c. CSW will assist with d/c planning to SNF.  Cori Razor LCSW [phone]

## 2015-03-22 NOTE — Discharge Summary (Signed)
Physician Discharge Summary       Patient ID: Rachel Vang MRN: [id number] DOB/AGE: [birth year] 50 y.o.  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 Pt also with PE and LLL DVT in 01/2015 admission on coumadin. Discharged with a trach to SNF and she pulled it out 2/14. Also, has a PEG. No signs and sx of infections. Apparently, pt was O2 dependent at SNF. At the ED, her O2 sats were 97-99 % on RA. Unclear if she has baseline OSA. Trach left out. Admitted for monitoring to assure airway protection. ER workup was notable for UTI as well as Cdiff. She was initially started on zosyn on 2/15 . Cultures grew out both e-coli and enterobacter. The enterobacter was resistant to zosyn so she was switched to cipro which both organisms were sensitive too. Her foley was also replaced on 2/15. Subsequently she began to have loose stools and was found to have C diff on 2/19. For this she was started on oral vanc via feeding tube. She was monitored over the weekend to ensure stability of electrolytes and blood counts. As of 2.21 she was deemed stable for d/c back to SNF with the current plan as outlined below.    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 No secretions. Hypersomnolent am 2/18, but ABG reassuring and improved some 2/19 am. Seems reasonable to continue to observe and avoid trach Plan Pulmonary hygiene as able - upright positioning, mobilize Aspiration precautions     E-Coli & Enterobacter UTI (resistent to zosyn) Foley replaced 2/15 Plan:  Continue cipro thru 2/25 Follow WBC intermittently  C diff, new problem. Minimal stool output Plan Oral vanc 14 days after completing cipro   PE, LLL DVT (01/2015) on Coumadin  Anemia  Plan: Coumadin per pharmacy  Trend INR (consider close monitoring while on cipro) Trend CBC   S/P PEG (12/2014) Protein Calorie Malnutrition  Plan: Continue TF  Nutrition consult  PEG care per protocol   Deconditioning, bed bound Hx CVA / ICH Chronic Pain - ? Difficult to assess, on fentanyl patch, pt denies pain Plan: Chair position as tolerated  Continue home keppra    HTN Tachycardia  Plan: Home norvasc, clonidine, metoprolol   Stage II Sacral Ulcer - present on admission Unstageable R Heel Ulcer - present on admission  Plan: Protective sacral dressing  WOC consult for heel ulcer recomendations    Significant Hospital tests/ studies  Consults: none   Discharge Exam: BP 158/93 mmHg  Pulse 102  Temp(Src) 98.7 F (37.1 C) (Oral)  Resp 20  Ht  (1.651 m)  Wt 157 lb 3 oz (71.3 kg)  BMI 26.16 kg/m2  SpO2 100%  LMP  (LMP Unknown)  General: chronically ill appearing female, awake, non-verbal PULM: CTA B HENT:  Trach dressing c/d/i PULM: CTA B CV: RRR, no mgr GI: BS+, soft, nontender MSK: normal bulk and tone Neuro: awake, tracks, moves spontaneously but doesn't talk, does nod and respond appropriately.   Labs at discharge Lab Results  Component Value Date   CREATININE 0.48 03/22/2015   BUN 9 03/22/2015   NA 138 03/22/2015   K 4.1 03/22/2015   CL 103 03/22/2015   CO2 25 03/22/2015   Lab Results  Component Value  Date   WBC 15.1* 03/20/2015   HGB 10.0* 03/20/2015   HCT 32.0* 03/20/2015   MCV 92.8 03/20/2015   PLT 312 03/20/2015   Lab Results  Component Value Date   ALT 45 03/14/2015   AST 24 03/14/2015   ALKPHOS 144* 03/14/2015   BILITOT 0.7 03/14/2015   Lab Results  Component Value Date   INR 1.56* 03/22/2015   INR 2.03* 03/21/2015   INR 2.15* 03/20/2015    Current radiology studies No results found.  Disposition:  03-Skilled Nursing Facility      Discharge Instructions    Increase activity slowly    Complete by:  As directed             Medication List    STOP taking these medications        enoxaparin 100 MG/ML injection  Commonly known as:  LOVENOX     fentaNYL 25 MCG/HR patch  Commonly known as:  DURAGESIC - dosed mcg/hr     metoprolol tartrate 25 MG tablet  Commonly known as:  LOPRESSOR  Replaced by:  metoprolol tartrate 25 mg/10 mL Susp      TAKE these medications        acetaminophen 325 MG tablet  Commonly known as:  TYLENOL  Place 2 tablets (650 mg total) into feeding tube every 4 (four) hours as needed for mild pain (temp > 101.5).     amLODipine 10 MG tablet  Commonly known as:  NORVASC  Take 1 tablet (10 mg total) by mouth daily.     bethanechol 10 MG tablet  Commonly known as:  URECHOLINE  Take 1 tablet (10 mg total) by mouth every 8 (eight) hours.     ciprofloxacin 500 MG tablet  Commonly known as:  CIPRO  Place 1 tablet (500 mg total) into feeding tube 2 (two) times daily.     cloNIDine 0.1 MG tablet  Commonly known as:  CATAPRES  Take 1 tablet (0.1 mg total) by mouth 2 (two) times daily.     feeding supplement (JEVITY 1.2 CAL) Liqd  Place 1,000 mLs into feeding tube continuous.     feeding supplement (JEVITY 1.2 CAL) Liqd  Place 1,000 mLs into feeding tube continuous.     feeding supplement (PRO-STAT SUGAR FREE 64) Liqd  Place 30 mLs into feeding tube 2 (two) times daily.     hydrocortisone 2.5 % rectal cream  Commonly known  as:  ANUSOL-HC  Place rectally 2 (two) times daily. For 3 days then stop     insulin aspart 100 UNIT/ML injection  Commonly known as:  novoLOG  Inject 0-9 Units into the skin every 4 (four) hours.     insulin glargine 100 UNIT/ML injection  Commonly known as:  LANTUS  Inject 0.1 mLs (10 Units total) into the skin daily.     levETIRAcetam 100 MG/ML solution  Commonly known as:  KEPPRA  Place 5 mLs (500 mg total) into feeding tube 2 (two) times daily.     metoprolol tartrate 25  mg/10 mL Susp  Commonly known as:  LOPRESSOR  Place 50 mLs (125 mg total) into feeding tube 2 (two) times daily.     vancomycin 50 mg/mL oral solution  Commonly known as:  VANCOCIN  Take 2.5 mLs (125 mg total) by mouth 4 (four) times daily.        warfarin 4 MG tablet  Commonly known as:  COUMADIN  Take 2 tablets (8 mg total)  Via tube daily (adjust per INR)         Discharged Condition: good  Physician Statement:   The Patient was personally examined, the discharge assessment and plan has been personally reviewed and I agree with ACNP Romy Ipock's assessment and plan. > 30 minutes of time have been dedicated to discharge assessment, planning and discharge instructions.   Signed: Shelby Mattocks 03/22/2015, 12:53 PM

## 2015-03-23 ENCOUNTER — Non-Acute Institutional Stay (SKILLED_NURSING_FACILITY): Payer: Medicaid Other | Admitting: Adult Health

## 2015-03-23 ENCOUNTER — Encounter: Payer: Self-pay | Admitting: Adult Health

## 2015-03-23 DIAGNOSIS — I824Y2 Acute embolism and thrombosis of unspecified deep veins of left proximal lower extremity: Secondary | ICD-10-CM

## 2015-03-23 DIAGNOSIS — I610 Nontraumatic intracerebral hemorrhage in hemisphere, subcortical: Secondary | ICD-10-CM

## 2015-03-23 DIAGNOSIS — N390 Urinary tract infection, site not specified: Secondary | ICD-10-CM | POA: Diagnosis not present

## 2015-03-23 DIAGNOSIS — I10 Essential (primary) hypertension: Secondary | ICD-10-CM

## 2015-03-23 DIAGNOSIS — E1159 Type 2 diabetes mellitus with other circulatory complications: Secondary | ICD-10-CM | POA: Diagnosis not present

## 2015-03-23 DIAGNOSIS — A047 Enterocolitis due to Clostridium difficile: Secondary | ICD-10-CM | POA: Diagnosis not present

## 2015-03-23 DIAGNOSIS — R1310 Dysphagia, unspecified: Secondary | ICD-10-CM

## 2015-03-23 DIAGNOSIS — J9621 Acute and chronic respiratory failure with hypoxia: Secondary | ICD-10-CM | POA: Diagnosis not present

## 2015-03-23 DIAGNOSIS — A0472 Enterocolitis due to Clostridium difficile, not specified as recurrent: Secondary | ICD-10-CM

## 2015-03-23 NOTE — Progress Notes (Signed)
CSW assisted with d/c planning to [facility] on 03/22/15. Pt required PTAR transport. Medical necessity form completed. Son, [name], contacted and was in agreement with d/c plan. Pt was unable to participate in d/c planning due to cognitive deficits. D/C Summary was sent to SNF for review prior to d/c. Scripts included in d/c packet. [facility] has provided a 30 day LOG.   Cori Razor LCSW [phone]

## 2015-03-23 NOTE — Progress Notes (Signed)
Patient ID: Rachel Vang, female   DOB: 1965/08/11, 50 y.o.   MRN: [id number]   Facility:[facility]       No Known Allergies  Chief Complaint  Patient presents with  . Hospitalization Follow-up    HPI:  She is a long term resident of this facility who was hospitalized after pulling out her trach. She has been able to leave the trach out. She was treated for c-diff and uti. She is unable to participate in the hpi or ros. There are no nursing concerns at this time.    Past Medical History  Diagnosis Date  . Hypertension   . Acute respiratory failure (HCC)   . Epilepsy (HCC)   . GERD (gastroesophageal reflux disease)   . Nontraumatic subarachnoid hemorrhage (HCC)   . Urinary retention   . Dysphagia   . Hyperlipidemia   . IBS (irritable bowel syndrome)   . Diabetes mellitus without complication (HCC)     Type 2, W/o complications    Past Surgical History  Procedure Laterality Date  . Radiology with anesthesia N/A 01/13/2015    Procedure: RADIOLOGY WITH ANESTHESIA;  Surgeon: Lisbeth Renshaw, MD;  Location: [facility] OR;  Service: Radiology;  Laterality: N/A;  . Esophagogastroduodenoscopy (egd) with propofol N/A 02/02/2015    Procedure: ESOPHAGOGASTRODUODENOSCOPY (EGD) WITH PROPOFOL;  Surgeon: Jimmye Norman, MD;  Location: [facility] ENDOSCOPY;  Service: General;  Laterality: N/A;  . Peg placement N/A 02/02/2015    Procedure: PERCUTANEOUS ENDOSCOPIC GASTROSTOMY (PEG) PLACEMENT;  Surgeon: Jimmye Norman, MD;  Location: [facility] ENDOSCOPY;  Service: General;  Laterality: N/A;  . Abdominal surgery    . Aneurysm coiling    . Tracheostomy    . Colonoscopy N/A 02/20/2015    Procedure: COLONOSCOPY;  Surgeon: Iva Boop, MD;  Location: [facility] ENDOSCOPY;  Service: Endoscopy;  Laterality: N/A;    VITAL SIGNS BP 173/93 mmHg  Pulse 95  Temp(Src) 98.5 F (36.9 C) (Oral)  Resp 20  Ht  (1.6 m)  Wt 145 lb (65.772 kg)  BMI 25.69 kg/m2  SpO2 95%  Patient's Medications  New Prescriptions   No  medications on file  Previous Medications   ACETAMINOPHEN (TYLENOL) 325 MG TABLET    Place 2 tablets (650 mg total) into feeding tube every 4 (four) hours as needed for mild pain (temp > 101.5).   AMINO ACIDS-PROTEIN HYDROLYS (FEEDING SUPPLEMENT, PRO-STAT SUGAR FREE 64,) LIQD    Place 30 mLs into feeding tube 2 (two) times daily.   AMLODIPINE (NORVASC) 10 MG TABLET    Take 1 tablet (10 mg total) by mouth daily.   BETHANECHOL (URECHOLINE) 10 MG TABLET    Take 1 tablet (10 mg total) by mouth every 8 (eight) hours.   CIPROFLOXACIN (CIPRO) 500 MG TABLET    Place 1 tablet (500 mg total) into feeding tube 2 (two) times daily.   CLONIDINE (CATAPRES) 0.1 MG TABLET    Take 1 tablet (0.1 mg total) by mouth 2 (two) times daily.   HYDROCORTISONE (ANUSOL-HC) 2.5 % RECTAL CREAM    Place rectally 2 (two) times daily. For 3 days then stop   INSULIN ASPART (NOVOLOG) 100 UNIT/ML INJECTION    Inject 0-9 Units into the skin every 4 (four) hours.   INSULIN GLARGINE (LANTUS) 100 UNIT/ML INJECTION    Inject 0.1 mLs (10 Units total) into the skin daily.   LEVETIRACETAM (KEPPRA) 100 MG/ML SOLUTION    Place 5 mLs (500 mg total) into feeding tube 2 (two) times daily.   METOPROLOL  TARTRATE (LOPRESSOR) 25 MG/10 ML SUSP    Place 50 mLs (125 mg total) into feeding tube 2 (two) times daily.   NUTRITIONAL SUPPLEMENTS (FEEDING SUPPLEMENT, JEVITY 1.2 CAL,) LIQD    Place 1,000 mLs into feeding tube continuous.   NUTRITIONAL SUPPLEMENTS (FEEDING SUPPLEMENT, JEVITY 1.2 CAL,) LIQD    Place 1,000 mLs into feeding tube continuous.   VANCOMYCIN (VANCOCIN) 50 MG/ML ORAL SOLUTION    Take 2.5 mLs (125 mg total) by mouth 4 (four) times daily.   WARFARIN (COUMADIN) 4 MG TABLET    Take 2 tablets (8 mg total) by mouth one time only at 6 PM.  Modified Medications   No medications on file  Discontinued Medications     SIGNIFICANT DIAGNOSTIC EXAMS  02-17-15: ct angio of chest: 1. Extensive pulmonary emboli involving distal main pulmonary  arteries extending into lobar, segmental and subsegmental sized branches throughout the lungs bilaterally. At this time, there are no overt findings to suggest right heart strain. 2. Cardiomegaly with left ventricular concentric hypertrophy. 3. Dependent atelectasis throughout the lower lobes of the lungs bilaterally.  02-18-15: bilateral lower extremity doppler: Findings consistent with acute deep vein thrombosis involving the left common femoral vein, left proximal profunda femoris vein, and left proximal femoral vein. Incidental findings are consistent with: enlarged lymph node on the right. - No evidence of deep vein thrombosis involving the right lower extremity. - No evidence of Baker&'s cyst on the right or left.  02-19-15: pelvic ultrasound: 1. Uterus not identified, presumed hysterectomy. No mass or free fluid seen within the midline pelvis. 2. Neither ovary is seen, perhaps bilateral oophorectomies, perhaps obscured by the fairly prominent fluid-filled bowel loops in the pelvis. No mass or free fluid seen within either adnexal region.  03-15-15: chest x-ray: 1. Tracheostomy tube is newly absent. No pneumomediastinum or pneumothorax. 2. Stable mild enlargement of the cardiopericardial silhouette. 3. Linear subsegmental atelectasis in the left lower lobe.  03-16-15: chest x-ray: Lungs clear.  Heart prominent but stable   LABS REVIEWED:   02-08-15: hgb a1c 7.1 03-14-15: wbc 20.1; hgb 10.8; hct 34.2; mcv 90.2; plt 313; glucose 119; bun 13; creat 0.46; k+ 4.1; na++131; liver normal albumin 3.1 03-20-15: wbc 15.1; hgb 10.0; hct 32.0; mcv 92.8; plt 312; stool for c-diff: +  03-21-15: glucose 161; bun 9; creat 0.46; k+ 3.7; na++139        Review of Systems  Unable to perform ROS: patient nonverbal     Physical Exam  Constitutional: No distress.  Eyes: Conjunctivae are normal.  Neck: Neck supple. No JVD present. No thyromegaly present.  Cardiovascular: Normal rate, regular rhythm and  intact distal pulses.   Respiratory: Effort normal and breath sounds normal. No respiratory distress. She has no wheezes.  Her trach is out   GI: Soft. Bowel sounds are normal. She exhibits no distension. There is no tenderness.  Musculoskeletal: She exhibits no edema.  Right hemiparesis   Lymphadenopathy:    She has no cervical adenopathy.  Neurological: She is alert.  Skin: Skin is warm and dry. She is not diaphoretic.  Psychiatric: She has a normal mood and affect.      ASSESSMENT/ PLAN:  1. Hypertension: will continue clonidine 0.1 mg twice daily and norvasc 10 mg daily and lopressor 125 mg twice daily   2. Diabetes :will continue lantus 10 units daily; novolog SSI hgb a1c is 7.1  3. Seizure: no reports of seizure activity present; will continue keppra 500 mg twice daily   4.  CVA: subarachnoid hemorrhage: is neurologically without change. Has right hemiparesis;   5. Chronic respiratory failure: her trach is out; she is maintaining her 02 sats. Will monitor  6. Dysphagia: is npo; is dependent upon peg tube feedings for her nutritional status no signs of aspiration present  7. UTI: is on urecholine 10 mg three times daily and is on cipro 500 mg twice daily   8. PE and left lower extremity dvt: is presently stable is on long term coumadin therapy.   9. c-diff infection: will complete vancomycin and will continue florastor will monitor    Time spent with patient  50   minutes >50% time spent counseling; reviewing medical record; tests; labs; and developing future plan of care     Synthia Innocent NP [facility]  Contact [phone] Monday through Friday 8am- 5pm  After hours call [phone]

## 2015-03-24 ENCOUNTER — Encounter: Payer: Self-pay | Admitting: Internal Medicine

## 2015-03-24 ENCOUNTER — Non-Acute Institutional Stay (SKILLED_NURSING_FACILITY): Payer: Medicaid Other | Admitting: Internal Medicine

## 2015-03-24 DIAGNOSIS — B9689 Other specified bacterial agents as the cause of diseases classified elsewhere: Secondary | ICD-10-CM | POA: Diagnosis not present

## 2015-03-24 DIAGNOSIS — A047 Enterocolitis due to Clostridium difficile: Secondary | ICD-10-CM | POA: Diagnosis not present

## 2015-03-24 DIAGNOSIS — I610 Nontraumatic intracerebral hemorrhage in hemisphere, subcortical: Secondary | ICD-10-CM | POA: Diagnosis not present

## 2015-03-24 DIAGNOSIS — I2692 Saddle embolus of pulmonary artery without acute cor pulmonale: Secondary | ICD-10-CM | POA: Diagnosis not present

## 2015-03-24 DIAGNOSIS — R569 Unspecified convulsions: Secondary | ICD-10-CM

## 2015-03-24 DIAGNOSIS — E43 Unspecified severe protein-calorie malnutrition: Secondary | ICD-10-CM | POA: Diagnosis not present

## 2015-03-24 DIAGNOSIS — J9509 Other tracheostomy complication: Secondary | ICD-10-CM

## 2015-03-24 DIAGNOSIS — B9620 Unspecified Escherichia coli [E. coli] as the cause of diseases classified elsewhere: Secondary | ICD-10-CM | POA: Diagnosis not present

## 2015-03-24 DIAGNOSIS — N3090 Cystitis, unspecified without hematuria: Secondary | ICD-10-CM

## 2015-03-24 DIAGNOSIS — N390 Urinary tract infection, site not specified: Secondary | ICD-10-CM

## 2015-03-24 DIAGNOSIS — I10 Essential (primary) hypertension: Secondary | ICD-10-CM | POA: Diagnosis not present

## 2015-03-24 DIAGNOSIS — L8990 Pressure ulcer of unspecified site, unspecified stage: Secondary | ICD-10-CM

## 2015-03-24 DIAGNOSIS — A0472 Enterocolitis due to Clostridium difficile, not specified as recurrent: Secondary | ICD-10-CM

## 2015-03-24 NOTE — Progress Notes (Signed)
MRN: [id number] Name: Rachel Vang  Sex: female Age: 50 y.o. DOB: 03/12/1965  PSC #: Ronni Rumble Facility/Room:111 Level Of Care: SNF Provider: Merrilee Seashore D Emergency Contacts: Extended Emergency Contact Information Primary Emergency Contact: Asante,Carlton  [place] Mobile Phone: [phone] Relation: Son Secondary Emergency Contact: Debroah [place] Mobile Phone: [phone] Relation: Son  Code Status:   Allergies: Review of patient's allergies indicates no known allergies.  Chief Complaint  Patient presents with  . Readmit To SNF    HPI: Patient is 50 y.o. 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 Pt was admitted to [facility] from 2/13-21 where it was determined that pt would do OK without trach. Also pt was dx and treated for a E Coli and Klebsiella UTI and C. Diff colitis. Pt is admitted back to SNF for care of vegetative state with PEG.While art SNF pt will be followed for s/p PE, tx with coumadin, HTN, tx with norvasc, clonidine and metoprolol seizures, tx with keppra.  Past Medical History  Diagnosis Date  . Hypertension   . Acute respiratory failure (HCC)   . Epilepsy (HCC)   . GERD (gastroesophageal reflux disease)   . Nontraumatic subarachnoid hemorrhage (HCC)   . Urinary retention   . Dysphagia   . Hyperlipidemia   . IBS (irritable bowel syndrome)   . Diabetes mellitus without complication (HCC)     Type 2, W/o complications    Past Surgical History  Procedure Laterality Date  . Radiology with anesthesia N/A 01/13/2015    Procedure: RADIOLOGY WITH ANESTHESIA;  Surgeon: Lisbeth Renshaw, MD;  Location: [facility] OR;  Service: Radiology;  Laterality: N/A;  . Esophagogastroduodenoscopy (egd) with propofol N/A 02/02/2015    Procedure: ESOPHAGOGASTRODUODENOSCOPY (EGD) WITH PROPOFOL;  Surgeon: Jimmye Norman, MD;  Location: [facility] ENDOSCOPY;  Service: General;  Laterality: N/A;  . Peg  placement N/A 02/02/2015    Procedure: PERCUTANEOUS ENDOSCOPIC GASTROSTOMY (PEG) PLACEMENT;  Surgeon: Jimmye Norman, MD;  Location: [facility] ENDOSCOPY;  Service: General;  Laterality: N/A;  . Abdominal surgery    . Aneurysm coiling    . Tracheostomy    . Colonoscopy N/A 02/20/2015    Procedure: COLONOSCOPY;  Surgeon: Iva Boop, MD;  Location: [facility] ENDOSCOPY;  Service: Endoscopy;  Laterality: N/A;      Medication List       This list is accurate as of: 03/24/15 11:59 PM.  Always use your most recent med list.               acetaminophen 325 MG tablet  Commonly known as:  TYLENOL  Place 2 tablets (650 mg total) into feeding tube every 4 (four) hours as needed for mild pain (temp > 101.5).     amLODipine 10 MG tablet  Commonly known as:  NORVASC  Take 1 tablet (10 mg total) by mouth daily.     bethanechol 10 MG tablet  Commonly known as:  URECHOLINE  Take 1 tablet (10 mg total) by mouth every 8 (eight) hours.     ciprofloxacin 500 MG tablet  Commonly known as:  CIPRO  Place 1 tablet (500 mg total) into feeding tube 2 (two) times daily.     cloNIDine 0.1 MG tablet  Commonly known as:  CATAPRES  Take 1 tablet (0.1 mg total) by mouth 2 (two) times daily.     feeding supplement (JEVITY 1.2 CAL) Liqd  Place 1,000 mLs into feeding tube continuous.  feeding supplement (JEVITY 1.2 CAL) Liqd  Place 1,000 mLs into feeding tube continuous.     feeding supplement (PRO-STAT SUGAR FREE 64) Liqd  Place 30 mLs into feeding tube 2 (two) times daily.     hydrocortisone 2.5 % rectal cream  Commonly known as:  ANUSOL-HC  Place rectally 2 (two) times daily. For 3 days then stop     insulin aspart 100 UNIT/ML injection  Commonly known as:  novoLOG  Inject 0-9 Units into the skin every 4 (four) hours.     insulin glargine 100 UNIT/ML injection  Commonly known as:  LANTUS  Inject 0.1 mLs (10 Units total) into the skin daily.     levETIRAcetam 100 MG/ML solution  Commonly known as:  KEPPRA   Place 5 mLs (500 mg total) into feeding tube 2 (two) times daily.     metoprolol tartrate 25 mg/10 mL Susp  Commonly known as:  LOPRESSOR  Place 50 mLs (125 mg total) into feeding tube 2 (two) times daily.     vancomycin 50 mg/mL oral solution  Commonly known as:  VANCOCIN  Take 2.5 mLs (125 mg total) by mouth 4 (four) times daily.     warfarin 4 MG tablet  Commonly known as:  COUMADIN  Take 2 tablets (8 mg total) by mouth one time only at 6 PM.        No orders of the defined types were placed in this encounter.     There is no immunization history on file for this patient.  Social History  Substance Use Topics  . Smoking status: Former Games developer  . Smokeless tobacco: Never Used  . Alcohol Use: No    Family history is + HTN, DM  Review of Systems  UTO 2/2 vegetative    Filed Vitals:   03/25/15 0716  BP: 110/69  Pulse: 78  Temp: 96.9 F (36.1 C)  Resp: 20    SpO2 Readings from Last 1 Encounters:  03/23/15 95%        Physical Exam  GENERAL APPEARANCE: eyes openNo acute distress.  SKIN: No diaphoresis rash HEAD: Normocephalic, atraumatic  EYES: Conjunctiva/lids clear; eye move randomly, did not track  EARS: External exam WNL, canals clear. Hearing grossly normal- occ some apparent response to voice  NOSE: No deformity or discharge.  MOUTH/THROAT: Lips w/o lesions; no ttrch  RESPIRATORY: Breathing is even, unlabored. Lung sounds are clear   CARDIOVASCULAR: Heart RRR no murmurs, rubs or gallops. No peripheral edema.   GASTROINTESTINAL: Abdomen is soft, non-tender, not distended w/ normal bowel sounds; PEG GENITOURINARY: Bladder non tender, not distended  MUSCULOSKELETAL: No abnormal joints or musculature NEUROLOGIC:  Vegetative, did not move during interview; head always looking to Left PSYCHIATRIC: n/a  Patient Active Problem List   Diagnosis Date Noted  . Klebsiella cystitis 03/25/2015  . Protein-calorie malnutrition, severe (HCC) 03/25/2015  .  UTI (lower urinary tract infection)   . Somnolence   . Pressure ulcer 03/15/2015  . Tracheostomy complication (HCC) 03/14/2015  . Chronic respiratory failure (HCC) 03/06/2015  . Hemorrhoids with complication   . Diverticulosis of colon without hemorrhage   . Acute pulmonary embolism (HCC) 02/19/2015  . DVT (deep vein thrombosis) in pregnancy 02/19/2015  . Lower GI bleeding 02/19/2015  . Dysphagia 02/18/2015  . Enteritis due to Clostridium difficile 02/18/2015  . HTN (hypertension) 02/18/2015  . Hemophilus influenzae (H. influenzae) as the cause of diseases classified elsewhere 02/18/2015  . Tracheobronchitis 02/18/2015  . E. coli UTI 02/18/2015  .  Seizures (HCC) 02/18/2015  . Elevated troponin 02/18/2015  . Urinary retention 02/18/2015  . Sepsis (HCC) 02/17/2015  . Diabetes mellitus (HCC) 02/17/2015  . History of ETT   . Hydrocephalus   . Tracheostomy in place [facility])   . Cerebral edema (HCC)   . Acute and chronic respiratory failure with hypoxia (HCC)   . Central line complication   . Encounter for central line care   . Encounter for feeding tube placement   . Neurological abnormality   . Subarachnoid hemorrhage (HCC)   . Acute respiratory failure with hypoxia and hypercapnia (HCC)   . Encounter for orogastric (OG) tube placement   . SAH (subarachnoid hemorrhage) (HCC)   . ICH (intracerebral hemorrhage) (HCC) 01/13/2015    CBC    Component Value Date/Time   WBC 15.1* 03/20/2015 0507   RBC 3.45* 03/20/2015 0507   HGB 10.0* 03/20/2015 0507   HCT 32.0* 03/20/2015 0507   PLT 312 03/20/2015 0507   MCV 92.8 03/20/2015 0507   LYMPHSABS 2.6 03/14/2015 1750   MONOABS 1.8* 03/14/2015 1750   EOSABS 0.2 03/14/2015 1750   BASOSABS 0.0 03/14/2015 1750    CMP     Component Value Date/Time   NA 138 03/22/2015 0417   K 4.1 03/22/2015 0417   CL 103 03/22/2015 0417   CO2 25 03/22/2015 0417   GLUCOSE 145* 03/22/2015 0417   BUN 9 03/22/2015 0417   CREATININE 0.48 03/22/2015  0417   CALCIUM 9.5 03/22/2015 0417   PROT 8.1 03/14/2015 1750   ALBUMIN 3.1* 03/14/2015 1750   AST 24 03/14/2015 1750   ALT 45 03/14/2015 1750   ALKPHOS 144* 03/14/2015 1750   BILITOT 0.7 03/14/2015 1750   GFRNONAA >60 03/22/2015 0417   GFRAA >60 03/22/2015 0417    Lab Results  Component Value Date   HGBA1C 7.1* 02/08/2015     Dg Chest Port 1 View  03/15/2015  CLINICAL DATA:  Tracheostomy dislodgement. EXAM: PORTABLE CHEST 1 VIEW COMPARISON:  02/20/2015 FINDINGS: The tracheostomy tube is currently absent.  No endotracheal tube. Linear subsegmental atelectasis at the left lung base. Mild enlargement of the cardiopericardial silhouette, without edema. No pneumomediastinum are pneumothorax. IMPRESSION: 1. Tracheostomy tube is newly absent. No pneumomediastinum or pneumothorax. 2. Stable mild enlargement of the cardiopericardial silhouette. 3. Linear subsegmental atelectasis in the left lower lobe. Electronically Signed   By: Gaylyn Rong M.D.   On: 03/15/2015 07:27    Not all labs, radiology exams or other studies done during hospitalization come through on my EPIC note; however they are reviewed by me.    Assessment and Plan  Tracheostomy complication (HCC) Trach out since 2/13 without any distress, remains on room air. No secretions. Hypersomnolent am 2/18, but ABG reassuring and improved some 2/19 am. Seems reasonable to continue to observe and avoid trach Plan Pulmonary hygiene as able - upright positioning, mobilize Aspiration precautions   E. coli UTI (resistent to zosyn) Foley replaced 2/15 SNF - Continue cipro thru 2/25  Klebsiella cystitis  (resistent to zosyn) Foley replaced 2/15 SNF - Continue cipro thru 2/25  Enteritis due to Clostridium difficile Minimal stool output SNF - Oral vanc 14 days after completing cipro  Acute pulmonary embolism (HCC) SNF - cont coumadin which will be actively titrated  Protein-calorie malnutrition, severe (HCC) SNF -  Continue TF  Nutrition consult  PEG care per protocol  HTN (hypertension) SNF - controlled; cont norvasc 10 mg, clonidine  0.1 mg BID, metoprolol 125 mg BID  Pressure ulcer  Stage II Sacral Ulcer - present on admission Unstageable R Heel Ulcer - present on admission  SNF - Protective sacral dressing  WOC consult for heel ulcer recomendations  ICH (intracerebral hemorrhage) (HCC) SNF - so far bedbound;supportive care; trach out, PEG feeding  Seizures (HCC) SNF - cont Keppra 500 mg BID   Time spent >45 min ;> 50% of time with patient was spent reviewing records, labs, tests and studies, counseling and developing plan of care  Margit Hanks, MD

## 2015-03-25 ENCOUNTER — Encounter: Payer: Self-pay | Admitting: Internal Medicine

## 2015-03-25 DIAGNOSIS — B9689 Other specified bacterial agents as the cause of diseases classified elsewhere: Secondary | ICD-10-CM | POA: Insufficient documentation

## 2015-03-25 DIAGNOSIS — E43 Unspecified severe protein-calorie malnutrition: Secondary | ICD-10-CM | POA: Insufficient documentation

## 2015-03-25 DIAGNOSIS — N3090 Cystitis, unspecified without hematuria: Secondary | ICD-10-CM

## 2015-03-25 NOTE — Assessment & Plan Note (Signed)
Stage II Sacral Ulcer - present on admission Unstageable R Heel Ulcer - present on admission  SNF - Protective sacral dressing  WOC consult for heel ulcer recomendations

## 2015-03-25 NOTE — Assessment & Plan Note (Signed)
(  resistent to zosyn) Foley replaced 2/15 SNF - Continue cipro thru 2/25

## 2015-03-25 NOTE — Assessment & Plan Note (Signed)
SNF - cont coumadin which will be actively titrated

## 2015-03-25 NOTE — Assessment & Plan Note (Signed)
SNF - Continue TF  Nutrition consult  PEG care per protocol

## 2015-03-25 NOTE — Assessment & Plan Note (Signed)
SNF - cont Keppra 500 mg BID

## 2015-03-25 NOTE — Assessment & Plan Note (Signed)
SNF - so far bedbound;supportive care; trach out, PEG feeding

## 2015-03-25 NOTE — Assessment & Plan Note (Signed)
Minimal stool output SNF - Oral vanc 14 days after completing cipro

## 2015-03-25 NOTE — Assessment & Plan Note (Signed)
SNF - controlled; cont norvasc 10 mg, clonidine  0.1 mg BID, metoprolol 125 mg BID

## 2015-03-25 NOTE — Assessment & Plan Note (Signed)
(  resistent to zosyn) Foley replaced 2/15 SNF - Continue cipro thru 2/25 

## 2015-03-25 NOTE — Assessment & Plan Note (Signed)
Rachel Vang out since 2/13 without any distress, remains on room air. No secretions. Hypersomnolent am 2/18, but ABG reassuring and improved some 2/19 am. Seems reasonable to continue to observe and avoid trach Plan Pulmonary hygiene as able - upright positioning, mobilize Aspiration precautions

## 2015-04-05 DIAGNOSIS — I82409 Acute embolism and thrombosis of unspecified deep veins of unspecified lower extremity: Secondary | ICD-10-CM | POA: Insufficient documentation

## 2015-04-05 HISTORY — DX: Acute embolism and thrombosis of unspecified deep veins of unspecified lower extremity: I82.409

## 2015-04-05 NOTE — Progress Notes (Signed)
Patient ID: Rachel Vang, female   DOB: Sep 25, 1965, 50 y.o.   MRN: [id number]    Facility:  [facility]       No Known Allergies  Chief Complaint  Patient presents with  . Hospitalization Follow-up    HPI:  She has suffered an intracranial hemorrhage on 01-13-15; she has suffered a PE and lower extremities dvt. She then suffered a lower GI bleed due to diverticulosis and internal hemorrhoids. She will be on coumadin therapy and will need lovenox to bridge. She is unable to participate in the hpi or ros. There are no nursing concerns at this time.    Past Medical History  Diagnosis Date  . Hypertension   . Acute respiratory failure ([place])   . Epilepsy ([place])   . GERD (gastroesophageal reflux disease)   . Nontraumatic subarachnoid hemorrhage ([place])   . Urinary retention   . Dysphagia   . Hyperlipidemia   . IBS (irritable bowel syndrome)   . Diabetes mellitus without complication (HCC)     Type 2, W/o complications    Past Surgical History  Procedure Laterality Date  . Radiology with anesthesia N/A 01/13/2015    Procedure: RADIOLOGY WITH ANESTHESIA;  Surgeon: Consuella Lose, MD;  Location: [place];  Service: Radiology;  Laterality: N/A;  . Esophagogastroduodenoscopy (egd) with propofol N/A 02/02/2015    Procedure: ESOPHAGOGASTRODUODENOSCOPY (EGD) WITH PROPOFOL;  Surgeon: Judeth Horn, MD;  Location: [place];  Service: General;  Laterality: N/A;  . Peg placement N/A 02/02/2015    Procedure: PERCUTANEOUS ENDOSCOPIC GASTROSTOMY (PEG) PLACEMENT;  Surgeon: Judeth Horn, MD;  Location: [facility] ENDOSCOPY;  Service: General;  Laterality: N/A;  . Abdominal surgery    . Aneurysm coiling    . Tracheostomy    . Colonoscopy N/A 02/20/2015    Procedure: COLONOSCOPY;  Surgeon: Gatha Mayer, MD;  Location: [place];  Service: Endoscopy;  Laterality: N/A;    VITAL SIGNS Pulse 100  Resp 22  Ht 5' 5"  (1.651 m)  Wt 154 lb (69.854 kg)  BMI 25.63 kg/m2  LMP  (LMP Unknown)  Blood  pressure 160/90    Patient's Medications  New Prescriptions  Previous Medications   AMINO ACIDS-PROTEIN HYDROLYS (FEEDING SUPPLEMENT, PRO-STAT SUGAR FREE 64,) LIQD    Place 30 mLs into feeding tube 2 (two) times daily.   AMLODIPINE (NORVASC) 10 MG TABLET    Take 1 tablet (10 mg total) by mouth daily.   BETHANECHOL (URECHOLINE) 10 MG TABLET    Take 1 tablet (10 mg total) by mouth every 8 (eight) hours.   CLONIDINE (CATAPRES) 0.1 MG TABLET    Take 1 tablet (0.1 mg total) by mouth 2 (two) times daily.   duragesic patch 25 mcg patch 25 mcg every 3 days   HYDROCORTISONE (ANUSOL-HC) 2.5 % RECTAL CREAM    Place rectally 2 (two) times daily. For 3 days then stop 02-26-15   INSULIN ASPART (NOVOLOG) 100 UNIT/ML INJECTION    Inject 5 units every 4 hours for cbg >150   INSULIN GLARGINE (LANTUS) 100 UNIT/ML INJECTION    Inject 0.1 mLs (10 Units total) into the skin daily.   Lopressor 125 mg  125 mg twice daily    LEVETIRACETAM (KEPPRA) 100 MG/ML SOLUTION    Place 5 mLs (500 mg total) into feeding tube 2 (two) times daily.   Coumadin  5 mg every pm    NUTRITIONAL SUPPLEMENTS (FEEDING SUPPLEMENT, JEVITY 1.2 CAL,) LIQD    Place 1,000 mLs into feeding tube continuous. 50 mL  per hour   Modified Medications   No medications on file  Discontinued Medications     SIGNIFICANT DIAGNOSTIC EXAMS  01-14-15: 2-d echo: - Left ventricle: The cavity size was normal. Wall thickness was increased in a pattern of severe LVH. Systolic function was normal. The estimated ejection fraction was in the range of 60  to 65%. Doppler parameters are consistent with abnormal left ventricular relaxation (grade 1 diastolic dysfunction).  02-02-15: ct of head; Anterior communicating artery aneurysm coiling with resolving diffuse subarachnoid hemorrhage unchanged. No new hemorrhage. Ventricular catheter remains in good position. Mild ventricular dilatation compared with the recent CT.  02-04-15: ct of head: 1. Stable mild lateral  ventriculomegaly. 2. Stable subarachnoid hemorrhage compared to 2 days ago. 3. Sphenoid sinusitis  02-11-15: abdominal ultrasound: Negative exam.  02-17-15: ct angio of chest: 1. Extensive pulmonary emboli involving distal main pulmonary arteries extending into lobar, segmental and subsegmental sized branches throughout the lungs bilaterally. At this time, there are no overt findings to suggest right heart strain. 2. Cardiomegaly with left ventricular concentric hypertrophy. 3. Dependent atelectasis throughout the lower lobes of the lungs bilaterally.  02-18-15: bilateral lower extremity doppler: Findings consistent with acute deep vein thrombosis involving the  left common femoral vein, left proximal profunda femoris vein, and left proximal femoral vein. Incidental findings are consistent with: enlarged lymph node on the right. - No evidence of deep vein thrombosis involving the right lower extremity. - No evidence of Baker&'s cyst on the right or left.  02-19-15: ultrasound of pelvis: 1. Uterus not identified, presumed hysterectomy. No mass or free fluid seen within the midline pelvis. 2. Neither ovary is seen, perhaps bilateral oophorectomies, perhaps obscured by the fairly prominent fluid-filled bowel loops in the pelvis. No mass or free fluid seen within either adnexal region.  02-20-15: chest x-ray: No focal consolidation.  02-20-15: colonoscopy: mild diverticulosis in sigmoid colon. Internal hemorrhoids    LABS REVIEWED:   02-08-15: hgb a1c 7.1 02-11-15: glucose 154; bun 21; creat 0.58; k+ 3.7; na++144; alt 106; albumin 2.3 02-17-15: wbc 15.9; hgb 11.9; hct 37.4; mcv 97.1; plt 250; glucose 201; bun 22; creat 0.84; k+ 4.4; na++142; ast 27; alt 57; alk phos 144; albumin 2.9; urine culture: e-coli; and enterobacter cloacae 02-22-15: wbc 10.4; hgb 10.2; hct 32.3; mcv 92.8; plt 256; glucose 158; bun 7; creat 0.50; k+ 3.5; na++139        Review of Systems  Unable to perform ROS: patient  nonverbal    Physical Exam  Constitutional: No distress.  Eyes: Conjunctivae are normal.  Neck: Neck supple. No JVD present. No thyromegaly present.  Cardiovascular: Normal rate, regular rhythm and intact distal pulses.   Respiratory: Effort normal and breath sounds normal. No respiratory distress. She has no wheezes.  Has trach   GI: Soft. Bowel sounds are normal. She exhibits no distension. There is no tenderness.  Peg tube present   Genitourinary:  Has foley   Musculoskeletal: She exhibits no edema.  No movement of extremities   Lymphadenopathy:    She has no cervical adenopathy.  Neurological:  Not certain if she is aware of surroundings  Does hold her head to the left   Skin: Skin is warm and dry. She is not diaphoretic.  Psychiatric: She has a normal mood and affect.      ASSESSMENT/ PLAN:  1. Seizure: no reports of seizure activity: will continue keppra 500 mg twice daily and will monitor   2. Hypertension: will continue norvasc 10 mg  daily lopressor 125 mg twice daily will increase clonidine to 0.1 mg three times daily   3. Diabetes: will continue lantus 10 units daily and novolog 5 units every 4 hours for cbg >150 hgb a1c is 7.1  4. Urine retention: has foley; will continue bethanechol 10 mg three times daily   5. 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 Chronic respiratory failure: is peg tube dependent; will monitor  7. Acute pulmonary embolism; and DVT; will continue coumadin therapy; and will lovenox therapy to bridge until inr therapeutic  8. Intracranial hemorrhage: without change in status; I am not certain is she is aware of her surroundings.     Time spent with patient  50   minutes >50% time spent counseling; reviewing medical record; tests; labs; and developing future plan of care     Ok Edwards NP [facility]  Contact [phone] Monday through Friday 8am- 5pm  After  hours call [phone]

## 2015-04-07 ENCOUNTER — Other Ambulatory Visit (HOSPITAL_COMMUNITY): Payer: Self-pay | Admitting: Internal Medicine

## 2015-04-07 DIAGNOSIS — R1310 Dysphagia, unspecified: Secondary | ICD-10-CM

## 2015-04-14 ENCOUNTER — Ambulatory Visit (HOSPITAL_COMMUNITY)
Admission: RE | Admit: 2015-04-14 | Discharge: 2015-04-14 | Disposition: A | Discharge status: Home / Self Care | Payer: Medicaid Other | Source: Ambulatory Visit | Attending: Internal Medicine | Admitting: Internal Medicine

## 2015-04-14 DIAGNOSIS — R1310 Dysphagia, unspecified: Secondary | ICD-10-CM | POA: Diagnosis not present

## 2015-04-18 LAB — PROTIME-INR: Protime: 34.6 seconds — AB (ref 10.0–13.8)

## 2015-04-18 LAB — POCT INR: INR: 3.4 — AB (ref 0.9–1.1)

## 2015-05-02 ENCOUNTER — Non-Acute Institutional Stay (SKILLED_NURSING_FACILITY): Payer: Medicaid Other | Admitting: Adult Health

## 2015-05-02 ENCOUNTER — Encounter: Payer: Self-pay | Admitting: Adult Health

## 2015-05-02 DIAGNOSIS — I2692 Saddle embolus of pulmonary artery without acute cor pulmonale: Secondary | ICD-10-CM

## 2015-05-02 DIAGNOSIS — R1310 Dysphagia, unspecified: Secondary | ICD-10-CM | POA: Diagnosis not present

## 2015-05-02 DIAGNOSIS — R569 Unspecified convulsions: Secondary | ICD-10-CM | POA: Diagnosis not present

## 2015-05-02 DIAGNOSIS — I10 Essential (primary) hypertension: Secondary | ICD-10-CM | POA: Diagnosis not present

## 2015-05-02 DIAGNOSIS — E1149 Type 2 diabetes mellitus with other diabetic neurological complication: Secondary | ICD-10-CM | POA: Diagnosis not present

## 2015-05-02 DIAGNOSIS — N390 Urinary tract infection, site not specified: Secondary | ICD-10-CM

## 2015-05-02 DIAGNOSIS — I609 Nontraumatic subarachnoid hemorrhage, unspecified: Secondary | ICD-10-CM

## 2015-05-02 DIAGNOSIS — Z931 Gastrostomy status: Secondary | ICD-10-CM | POA: Diagnosis not present

## 2015-05-02 DIAGNOSIS — I824Y2 Acute embolism and thrombosis of unspecified deep veins of left proximal lower extremity: Secondary | ICD-10-CM | POA: Diagnosis not present

## 2015-05-02 NOTE — Progress Notes (Signed)
Patient ID: Rachel Vang, female   DOB: [birth year], 50 y.o.   MRN: [id number]    Facility:  [facility]       No Known Allergies  Chief Complaint  Patient presents with  . Medical Management of Chronic Issues    Follow up    HPI:  She is a long term resident of this facility being seen for the management of her chronic illnesses. She is doing well. She is more engaging in her surroundings. She is attempting to interact with those around her. She is moving more. She does remain dependent upon her tube feeding for her nutritional support. She is unable to participate in the hpi or ros. There are no nursing concerns at this time.   Past Medical History  Diagnosis Date  . Hypertension   . Acute respiratory failure (HCC)   . Epilepsy (HCC)   . GERD (gastroesophageal reflux disease)   . Nontraumatic subarachnoid hemorrhage (HCC)   . Urinary retention   . Dysphagia   . Hyperlipidemia   . IBS (irritable bowel syndrome)   . Diabetes mellitus without complication (HCC)     Type 2, W/o complications    Past Surgical History  Procedure Laterality Date  . Radiology with anesthesia N/A 01/13/2015    Procedure: RADIOLOGY WITH ANESTHESIA;  Surgeon: Lisbeth Renshaw, MD;  Location: [facility] OR;  Service: Radiology;  Laterality: N/A;  . Esophagogastroduodenoscopy (egd) with propofol N/A 02/02/2015    Procedure: ESOPHAGOGASTRODUODENOSCOPY (EGD) WITH PROPOFOL;  Surgeon: Jimmye Norman, MD;  Location: [facility] ENDOSCOPY;  Service: General;  Laterality: N/A;  . Peg placement N/A 02/02/2015    Procedure: PERCUTANEOUS ENDOSCOPIC GASTROSTOMY (PEG) PLACEMENT;  Surgeon: Jimmye Norman, MD;  Location: [facility] ENDOSCOPY;  Service: General;  Laterality: N/A;  . Abdominal surgery    . Aneurysm coiling    . Tracheostomy    . Colonoscopy N/A 02/20/2015    Procedure: COLONOSCOPY;  Surgeon: Iva Boop, MD;  Location: [facility] ENDOSCOPY;  Service: Endoscopy;  Laterality: N/A;    VITAL SIGNS BP 116/88 mmHg  Pulse 89  Temp(Src)  97 F (36.1 C) (Oral)  Resp 18  Ht  (1.6 m)  Wt 141 lb (63.957 kg)  BMI 24.98 kg/m2  SpO2 98%  LMP  (LMP Unknown)  Patient's Medications  New Prescriptions   No medications on file  Previous Medications   ACETAMINOPHEN (TYLENOL) 325 MG TABLET    Place 2 tablets (650 mg total) into feeding tube every 4 (four) hours as needed for mild pain (temp > 101.5).   AMINO ACIDS-PROTEIN HYDROLYS (FEEDING SUPPLEMENT, PRO-STAT SUGAR FREE 64,) LIQD    Place 30 mLs into feeding tube 2 (two) times daily.   AMLODIPINE (NORVASC) 10 MG TABLET    Take 1 tablet (10 mg total) by mouth daily.   BETHANECHOL (URECHOLINE) 10 MG TABLET    Take 1 tablet (10 mg total) by mouth every 8 (eight) hours.   CLONIDINE (CATAPRES) 0.1 MG TABLET    Take 1 tablet (0.1 mg total) by mouth 2 (two) times daily.   HYDROCORTISONE (ANUSOL-HC) 2.5 % RECTAL CREAM    Place rectally 2 (two) times daily. For 3 days then stop   INSULIN ASPART (NOVOLOG) 100 UNIT/ML INJECTION    Inject 0-9 Units into the skin every 4 (four) hours.   INSULIN GLARGINE (LANTUS) 100 UNIT/ML INJECTION    Inject 0.1 mLs (10 Units total) into the skin daily.   LEVETIRACETAM (KEPPRA) 100 MG/ML SOLUTION    Place 5 mLs (  500 mg total) into feeding tube 2 (two) times daily.   METOPROLOL TARTRATE (LOPRESSOR) 25 MG/10 ML SUSP    Place 50 mLs (125 mg total) into feeding tube 2 (two) times daily.   NUTRITIONAL SUPPLEMENTS (FEEDING SUPPLEMENT, JEVITY 1.2 CAL,) LIQD    Place 1,000 mLs into feeding tube continuous.   WARFARIN SODIUM PO    Take 11 mg by mouth daily.  Modified Medications   No medications on file  Discontinued Medications     SIGNIFICANT DIAGNOSTIC EXAMS  02-17-15: ct angio of chest: 1. Extensive pulmonary emboli involving distal main pulmonary arteries extending into lobar, segmental and subsegmental sized branches throughout the lungs bilaterally. At this time, there are no overt findings to suggest right heart strain. 2. Cardiomegaly with left  ventricular concentric hypertrophy. 3. Dependent atelectasis throughout the lower lobes of the lungs bilaterally.  02-18-15: bilateral lower extremity doppler: Findings consistent with acute deep vein thrombosis involving the left common femoral vein, left proximal profunda femoris vein, and left proximal femoral vein. Incidental findings are consistent with: enlarged lymph node on the right. - No evidence of deep vein thrombosis involving the right lower extremity. - No evidence of Baker&'s cyst on the right or left.  02-19-15: pelvic ultrasound: 1. Uterus not identified, presumed hysterectomy. No mass or free fluid seen within the midline pelvis. 2. Neither ovary is seen, perhaps bilateral oophorectomies, perhaps obscured by the fairly prominent fluid-filled bowel loops in the pelvis. No mass or free fluid seen within either adnexal region.  03-15-15: chest x-ray: 1. Tracheostomy tube is newly absent. No pneumomediastinum or pneumothorax. 2. Stable mild enlargement of the cardiopericardial silhouette. 3. Linear subsegmental atelectasis in the left lower lobe.  03-16-15: chest x-ray: Lungs clear.  Heart prominent but stable   LABS REVIEWED:   02-08-15: hgb a1c 7.1 03-14-15: wbc 20.1; hgb 10.8; hct 34.2; mcv 90.2; plt 313; glucose 119; bun 13; creat 0.46; k+ 4.1; na++131; liver normal albumin 3.1 03-20-15: wbc 15.1; hgb 10.0; hct 32.0; mcv 92.8; plt 312; stool for c-diff: +  03-21-15: glucose 161; bun 9; creat 0.46; k+ 3.7; na++139  04-18-15: INR 3.4        Review of Systems  Unable to perform ROS: patient nonverbal     Physical Exam  Constitutional: No distress.  Eyes: Conjunctivae are normal.  Neck: Neck supple. No JVD present. No thyromegaly present.  Cardiovascular: Normal rate, regular rhythm and intact distal pulses.   Respiratory: Effort normal and breath sounds normal. No respiratory distress. She has no wheezes.  Her trach is out   GI: Soft. Bowel sounds are normal. She  exhibits no distension. There is no tenderness.  Has peg tube  Musculoskeletal: She exhibits no edema.  Right hemiparesis   Lymphadenopathy:    She has no cervical adenopathy.  Neurological: She is alert.  Skin: Skin is warm and dry. She is not diaphoretic.  Psychiatric: She has a normal mood and affect.      ASSESSMENT/ PLAN:  1. Hypertension: will continue clonidine 0.1 mg twice daily and norvasc 10 mg daily and lopressor 125 mg twice daily   2. Diabetes :will continue lantus 10 units daily; novolog SSI hgb a1c is 7.1  3. Seizure: no reports of seizure activity present; will continue keppra 500 mg twice daily   4. CVA: subarachnoid hemorrhage: is neurologically without change. Has right hemiparesis;   5. Chronic respiratory failure: her trach is out; she is maintaining her 02 sats. Will monitor  6. Dysphagia: is  npo; is dependent upon peg tube feedings for her nutritional status no signs of aspiration present  7. UTI: is on urecholine 10 mg three times daily    8. PE and left lower extremity dvt: is presently stable is on long term coumadin therapy.      Synthia Innocent NP [facility]  Contact [phone] Monday through Friday 8am- 5pm  After hours call [phone]

## 2015-05-04 ENCOUNTER — Inpatient Hospital Stay (HOSPITAL_COMMUNITY): Admit: 2015-05-04 | Payer: Self-pay

## 2015-05-10 ENCOUNTER — Other Ambulatory Visit: Payer: Self-pay | Admitting: Internal Medicine

## 2015-05-10 DIAGNOSIS — K9423 Gastrostomy malfunction: Secondary | ICD-10-CM

## 2015-05-10 LAB — PROTIME-INR: PROTIME: 31.3 s — AB (ref 10.0–13.8)

## 2015-05-10 LAB — POCT INR: INR: 3 — AB (ref <=1.1)

## 2015-05-11 ENCOUNTER — Ambulatory Visit (HOSPITAL_COMMUNITY)
Admission: RE | Admit: 2015-05-11 | Discharge: 2015-05-11 | Disposition: A | Discharge status: Home / Self Care | Payer: Medicaid Other | Source: Ambulatory Visit | Attending: Internal Medicine | Admitting: Internal Medicine

## 2015-05-11 DIAGNOSIS — K9423 Gastrostomy malfunction: Secondary | ICD-10-CM | POA: Diagnosis present

## 2015-05-11 MED ORDER — IOPAMIDOL (ISOVUE-300) INJECTION 61%
INTRAVENOUS | Status: AC
  Filled 2015-05-11: qty 50

## 2015-05-11 MED ORDER — IOPAMIDOL (ISOVUE-300) INJECTION 61%
INTRAVENOUS | Status: AC
  Administered 2015-05-11: 20 mL
  Filled 2015-05-11: qty 50

## 2015-05-11 MED ORDER — LIDOCAINE VISCOUS 2 % MT SOLN
OROMUCOSAL | Status: AC
  Filled 2015-05-11: qty 15

## 2015-05-13 ENCOUNTER — Emergency Department (HOSPITAL_COMMUNITY): Payer: Medicaid Other

## 2015-05-13 ENCOUNTER — Encounter (HOSPITAL_COMMUNITY): Payer: Self-pay

## 2015-05-13 ENCOUNTER — Emergency Department (HOSPITAL_COMMUNITY)
Admission: EM | Admit: 2015-05-13 | Discharge: 2015-05-14 | Disposition: A | Discharge status: Home / Self Care | Payer: Medicaid Other | Attending: Emergency Medicine | Admitting: Emergency Medicine

## 2015-05-13 DIAGNOSIS — K5669 Other intestinal obstruction: Secondary | ICD-10-CM | POA: Insufficient documentation

## 2015-05-13 DIAGNOSIS — G40909 Epilepsy, unspecified, not intractable, without status epilepticus: Secondary | ICD-10-CM | POA: Insufficient documentation

## 2015-05-13 DIAGNOSIS — Z794 Long term (current) use of insulin: Secondary | ICD-10-CM | POA: Insufficient documentation

## 2015-05-13 DIAGNOSIS — K9420 Gastrostomy complication, unspecified: Secondary | ICD-10-CM

## 2015-05-13 DIAGNOSIS — E119 Type 2 diabetes mellitus without complications: Secondary | ICD-10-CM | POA: Diagnosis not present

## 2015-05-13 DIAGNOSIS — Z87891 Personal history of nicotine dependence: Secondary | ICD-10-CM | POA: Diagnosis not present

## 2015-05-13 DIAGNOSIS — I10 Essential (primary) hypertension: Secondary | ICD-10-CM | POA: Insufficient documentation

## 2015-05-13 DIAGNOSIS — K9423 Gastrostomy malfunction: Secondary | ICD-10-CM

## 2015-05-13 DIAGNOSIS — K56600 Partial intestinal obstruction, unspecified as to cause: Secondary | ICD-10-CM

## 2015-05-13 DIAGNOSIS — Z79899 Other long term (current) drug therapy: Secondary | ICD-10-CM | POA: Insufficient documentation

## 2015-05-13 DIAGNOSIS — Z431 Encounter for attention to gastrostomy: Secondary | ICD-10-CM | POA: Diagnosis present

## 2015-05-13 DIAGNOSIS — K219 Gastro-esophageal reflux disease without esophagitis: Secondary | ICD-10-CM | POA: Insufficient documentation

## 2015-05-13 DIAGNOSIS — Z9889 Other specified postprocedural states: Secondary | ICD-10-CM | POA: Insufficient documentation

## 2015-05-13 DIAGNOSIS — Z7901 Long term (current) use of anticoagulants: Secondary | ICD-10-CM | POA: Diagnosis not present

## 2015-05-13 DIAGNOSIS — Z8709 Personal history of other diseases of the respiratory system: Secondary | ICD-10-CM | POA: Diagnosis not present

## 2015-05-13 LAB — BASIC METABOLIC PANEL
ANION GAP: 11 (ref 5–15)
BUN: 10 mg/dL (ref 6–20)
CALCIUM: 9.7 mg/dL (ref 8.9–10.3)
CO2: 28 mmol/L (ref 22–32)
Chloride: 100 mmol/L — ABNORMAL LOW (ref 101–111)
Creatinine, Ser: 0.69 mg/dL (ref 0.44–1.00)
Glucose, Bld: 113 mg/dL — ABNORMAL HIGH (ref 65–99)
Potassium: 3.4 mmol/L — ABNORMAL LOW (ref 3.5–5.1)
SODIUM: 139 mmol/L (ref 135–145)

## 2015-05-13 LAB — CBC WITH DIFFERENTIAL/PLATELET
BASOS ABS: 0 10*3/uL (ref 0.0–0.1)
BASOS PCT: 0 %
EOS ABS: 0.2 10*3/uL (ref 0.0–0.7)
Eosinophils Relative: 2 %
HEMATOCRIT: 40.3 % (ref 36.0–46.0)
HEMOGLOBIN: 12.6 g/dL (ref 12.0–15.0)
Lymphocytes Relative: 31 %
Lymphs Abs: 2.4 10*3/uL (ref 0.7–4.0)
MCH: 27.2 pg (ref 26.0–34.0)
MCHC: 31.3 g/dL (ref 30.0–36.0)
MCV: 86.9 fL (ref 78.0–100.0)
Monocytes Absolute: 0.8 10*3/uL (ref 0.1–1.0)
Monocytes Relative: 10 %
NEUTROS ABS: 4.5 10*3/uL (ref 1.7–7.7)
NEUTROS PCT: 57 %
Platelets: 309 10*3/uL (ref 150–400)
RBC: 4.64 MIL/uL (ref 3.87–5.11)
RDW: 16.3 % — AB (ref 11.5–15.5)
WBC: 8 10*3/uL (ref 4.0–10.5)

## 2015-05-13 LAB — POCT INR: INR: 2.7 — AB (ref <=1.1)

## 2015-05-13 LAB — PROTIME-INR: PROTIME: 29 s — AB (ref 10.0–13.8)

## 2015-05-13 LAB — HEPATIC FUNCTION PANEL
ALBUMIN: 3.4 g/dL — AB (ref 3.5–5.0)
ALK PHOS: 91 U/L (ref 38–126)
ALT: 19 U/L (ref 14–54)
AST: 17 U/L (ref 15–41)
BILIRUBIN TOTAL: 0.6 mg/dL (ref 0.3–1.2)
Bilirubin, Direct: 0.1 mg/dL (ref 0.1–0.5)
Indirect Bilirubin: 0.5 mg/dL (ref 0.3–0.9)
TOTAL PROTEIN: 7.4 g/dL (ref 6.5–8.1)

## 2015-05-13 LAB — LIPASE, BLOOD: LIPASE: 19 U/L (ref 11–51)

## 2015-05-13 MED ORDER — IOPAMIDOL (ISOVUE-300) INJECTION 61%
INTRAVENOUS | Status: AC
  Administered 2015-05-13: 100 mL
  Filled 2015-05-13: qty 100

## 2015-05-13 MED ORDER — IOHEXOL 180 MG/ML  SOLN: 20.0000 mL | Freq: Once | INTRAMUSCULAR | Status: DC | PRN

## 2015-05-13 MED ORDER — LIDOCAINE VISCOUS 2 % MT SOLN
OROMUCOSAL | Status: AC
  Filled 2015-05-13: qty 15

## 2015-05-13 MED ORDER — SODIUM CHLORIDE 0.9 % IV BOLUS (SEPSIS)
250.0000 mL | Freq: Once | INTRAVENOUS | Status: AC
  Administered 2015-05-13: 250 mL via INTRAVENOUS

## 2015-05-13 MED ORDER — IOPAMIDOL (ISOVUE-300) INJECTION 61%
INTRAVENOUS | Status: AC
  Administered 2015-05-13: 50 mL via GASTROSTOMY
  Filled 2015-05-13: qty 50

## 2015-05-13 MED ORDER — ONDANSETRON HCL 4 MG/2ML IJ SOLN
4.0000 mg | Freq: Once | INTRAMUSCULAR | Status: AC
  Administered 2015-05-13: 4 mg via INTRAVENOUS
  Filled 2015-05-13: qty 2

## 2015-05-13 MED ORDER — SODIUM CHLORIDE 0.9 % IV SOLN: INTRAVENOUS | Status: DC

## 2015-05-13 MED ORDER — IOPAMIDOL (ISOVUE-300) INJECTION 61%
INTRAVENOUS | Status: AC
  Administered 2015-05-13: 10 mL
  Filled 2015-05-13: qty 50

## 2015-05-13 MED ORDER — SODIUM CHLORIDE 0.9 % IV SOLN
INTRAVENOUS | Status: DC
  Administered 2015-05-13: 20:00:00 via INTRAVENOUS

## 2015-05-13 NOTE — ED Notes (Signed)
Pt presents to er via ems for replacement of her peg tube that she pulled out unknown size, patient is from [facility] facility for [facility]va and is at her baseline neurologically

## 2015-05-13 NOTE — Procedures (Signed)
Interventional Radiology Procedure Note  Procedure: Rescue of displaced balloon retention percutaneous gastrostomy tube.  Placed 23F balloon retention, with 8cc fluid into the balloon.  Complications: None Recommendations:  - Ok to use tube.  - Consider binder to decrease risk of further displacement.    Signed,  Yvone NeuJaime S. Loreta AveWagner, DO

## 2015-05-13 NOTE — ED Provider Notes (Signed)
CSN: [id number]     Arrival date & time 05/13/15  1534 History   First MD Initiated Contact with Patient 05/13/15 1541     Chief Complaint  Patient presents with  . peg tube replacement      (Consider location/radiation/quality/duration/timing/severity/associated sxs/prior Treatment) Patient is a 50 y.o. female presenting with GI illness. The history is provided by the nursing home.  GI Problem This is a new problem. The current episode started 6 to 12 hours ago. The problem occurs constantly. The problem has not changed since onset.Associated symptoms comments: Removed gastrostomy tube, pulled out per nursing facility report. Nothing aggravates the symptoms. Nothing relieves the symptoms. She has tried nothing for the symptoms.    Past Medical History  Diagnosis Date  . Hypertension   . Acute respiratory failure (HCC)   . Epilepsy (HCC)   . GERD (gastroesophageal reflux disease)   . Nontraumatic subarachnoid hemorrhage (HCC)   . Urinary retention   . Dysphagia   . Hyperlipidemia   . IBS (irritable bowel syndrome)   . Diabetes mellitus without complication (HCC)     Type 2, W/o complications   Past Surgical History  Procedure Laterality Date  . Radiology with anesthesia N/A 01/13/2015    Procedure: RADIOLOGY WITH ANESTHESIA;  Surgeon: Lisbeth Renshaw, MD;  Location: [facility] OR;  Service: Radiology;  Laterality: N/A;  . Esophagogastroduodenoscopy (egd) with propofol N/A 02/02/2015    Procedure: ESOPHAGOGASTRODUODENOSCOPY (EGD) WITH PROPOFOL;  Surgeon: Jimmye Norman, MD;  Location: [facility] ENDOSCOPY;  Service: General;  Laterality: N/A;  . Peg placement N/A 02/02/2015    Procedure: PERCUTANEOUS ENDOSCOPIC GASTROSTOMY (PEG) PLACEMENT;  Surgeon: Jimmye Norman, MD;  Location: [facility] ENDOSCOPY;  Service: General;  Laterality: N/A;  . Abdominal surgery    . Aneurysm coiling    . Tracheostomy    . Colonoscopy N/A 02/20/2015    Procedure: COLONOSCOPY;  Surgeon: Iva Boop, MD;  Location: [facility] ENDOSCOPY;   Service: Endoscopy;  Laterality: N/A;   No family history on file. Social History  Substance Use Topics  . Smoking status: Former Games developer  . Smokeless tobacco: Never Used  . Alcohol Use: No   OB History    No data available     Review of Systems  Unable to perform ROS: Mental status change  Level 5 exception, patient verbal but poor historian, unable to provide further history    Allergies  Review of patient's allergies indicates no known allergies.  Home Medications   Prior to Admission medications   Medication Sig Start Date End Date Taking? Authorizing Provider  acetaminophen (TYLENOL) 325 MG tablet Place 2 tablets (650 mg total) into feeding tube every 4 (four) hours as needed for mild pain (temp > 101.5). 03/22/15   Simonne Martinet, NP  Amino Acids-Protein Hydrolys (FEEDING SUPPLEMENT, PRO-STAT SUGAR FREE 64,) LIQD Place 30 mLs into feeding tube 2 (two) times daily. 02/11/15   Leroy Sea, MD  amLODipine (NORVASC) 10 MG tablet Take 1 tablet (10 mg total) by mouth daily. Patient taking differently: Place 10 mg into feeding tube daily.  02/11/15   Leroy Sea, MD  bethanechol (URECHOLINE) 10 MG tablet Take 1 tablet (10 mg total) by mouth every 8 (eight) hours. Patient taking differently: Place 10 mg into feeding tube every 8 (eight) hours.  02/11/15   Leroy Sea, MD  cloNIDine (CATAPRES) 0.1 MG tablet Take 1 tablet (0.1 mg total) by mouth 2 (two) times daily. Patient taking differently: Place 0.1 mg into feeding tube  every 8 (eight) hours.  02/22/15   Leana Roeawood S Elgergawy, MD  hydrocortisone (ANUSOL-HC) 2.5 % rectal cream Place rectally 2 (two) times daily. For 3 days then stop 02/22/15   Starleen Armsawood S Elgergawy, MD  insulin aspart (NOVOLOG) 100 UNIT/ML injection Inject 0-9 Units into the skin every 4 (four) hours. Patient taking differently: Inject 5 Units into the skin every 4 (four) hours. For cbg >=150 02/22/15   Leana Roeawood S Elgergawy, MD  insulin glargine (LANTUS) 100  UNIT/ML injection Inject 0.1 mLs (10 Units total) into the skin daily. 02/22/15   Leana Roeawood S Elgergawy, MD  levETIRAcetam (KEPPRA) 100 MG/ML solution Place 5 mLs (500 mg total) into feeding tube 2 (two) times daily. 02/11/15   Leroy SeaPrashant K Singh, MD  metoprolol tartrate (LOPRESSOR) 25 mg/10 mL SUSP Place 50 mLs (125 mg total) into feeding tube 2 (two) times daily. 03/22/15   Simonne MartinetPeter E Babcock, NP  Nutritional Supplements (FEEDING SUPPLEMENT, JEVITY 1.2 CAL,) LIQD Place 1,000 mLs into feeding tube continuous. 03/22/15   Simonne MartinetPeter E Babcock, NP  vancomycin (VANCOCIN) 50 mg/mL oral solution Take 2.5 mLs (125 mg total) by mouth 4 (four) times daily. Patient not taking: Reported on 05/02/2015 03/22/15   Simonne MartinetPeter E Babcock, NP  WARFARIN SODIUM PO Take 11 mg by mouth daily.    Historical Provider, MD   BP 127/81 mmHg  Pulse 93  Temp(Src) 98 F (36.7 C) (Axillary)  Resp 18  Ht 5\' 6"  (1.676 m)  Wt 150 lb (68.04 kg)  BMI 24.22 kg/m2  SpO2 100%  LMP  (LMP Unknown) Physical Exam  Constitutional: She appears well-developed and well-nourished. No distress.  HENT:  Head: Normocephalic.  Eyes: Conjunctivae are normal.  Neck: Neck supple. No tracheal deviation present.  Cardiovascular: Normal rate, regular rhythm and normal heart sounds.   Pulmonary/Chest: Effort normal and breath sounds normal. No respiratory distress.  Abdominal: Soft. She exhibits no distension. There is no tenderness. There is no rigidity, no rebound and no guarding.  g-tube site in tact, no drainage, induration, or erythema  Neurological: She is alert. She has normal strength. She is disoriented.  Skin: Skin is warm and dry.  Vitals reviewed.   ED Course  Gastrostomy tube replacement Date/Time: 05/13/2015 4:46 PM Performed by: Lyndal PulleyKNOTT, Verley Pariseau Authorized by: Lyndal PulleyKNOTT, Atlas Kuc Consent: Verbal consent obtained. Required items: required blood products, implants, devices, and special equipment available Patient identity confirmed: arm band, provided  demographic data and hospital-assigned identification number Time out: Immediately prior to procedure a "time out" was called to verify the correct patient, procedure, equipment, support staff and site/side marked as required. Local anesthesia used: no Patient sedated: no Patient tolerance: Patient tolerated the procedure well with no immediate complications Comments: Attempted 22 Fr foley, unable to pass, 18 Fr foley passed without difficulty, XR obtained to confirm   (including critical care time)  Labs Review Labs Reviewed - No data to display  Imaging Review No results found. I have personally reviewed and evaluated these images and lab results as part of my medical decision-making.   EKG Interpretation None      MDM   Final diagnoses:  Malfunction of gastrostomy tube (HCC)  Gastrostomy complication (HCC)    50 y.o. female presents with removed gastrostomy tube. Initially placed 3 months ago, replaced recently by IR. Had 24 Fr previously. 22 Fr foley unable to advance through opening so 18 Fr catheter was placed without meeting any resistance. IR consulted who agreed to replace tube formally under fluoroscopy. Confirmed in gastric  lumen, Radiology called with possible abnormal finding, repeat study ordered per their request. Care transferred to Dr Deretha Emory pending IR tube placement and repeat abdominal films.     Lyndal Pulley, MD 05/13/15 2036

## 2015-05-14 NOTE — Discharge Instructions (Signed)
Gastrostomy Tube Replacement A gastrostomy tube replacement is a procedure to change the tube that goes into your stomach. This may be a planned procedure, or it may be an emergency procedure if your tube has come out or is not working.  [facility] KNOW ABOUT:  Any allergies you have.  All medicines you are taking, including vitamins, herbs, eye drops, creams, and over-the-counter medicines.  Previous problems you or members of your family have had with the use of anesthetics.  Any blood disorders you have.  Any surgeries you have had.  Medical conditions you have. RISKS AND COMPLICATIONS Generally, this is a safe procedure. However, problems can occur and include:  Bleeding.  Infection.  Leaking. BEFORE THE PROCEDURE  Let your health care provider know how long you have had your gastrostomy tube. If your tube has been in place for less than two weeks, you may need another surgical placement procedure, not just a replacement.  If your tube has come out at home, bring the tube with you so your health care provider can see the type you are using. PROCEDURE  You may be given a medicine that numbs the insertion area (local anesthetic).  If your gastrostomy tube is partially displaced, or is in place but not working, it will be removed.  If you have an inflatable tube, your health care provider may deflate the balloon at the end of the tube with a syringe.  Your health care provider will apply pressure to your belly as the tube is pulled out. Then the health care provider will gently probe the opening of your gastrostomy to check it.  The opening for the gastrostomy tube may be lubricated with a jelly-like ointment.  You will get a new tube.  If the new tube does not go in easily, you may have a smaller tube put in to keep the track open.  The new tube will be secured in place. AFTER THE PROCEDURE  You may have an X-ray to make sure the new tube is in the  right place and is working well. To do this, your health care provider will put a liquid that shows up on X-rays through the tube. The X-ray checks that the fluid is not leaking outside of your stomach.   This information is not intended to replace advice given to you by your health care provider. Make sure you discuss any questions you have with your health care provider.   Interventional radiology replace the G-tube. And is functional. Extensive workup with concerns for what ended up being chronic abdominal bowel distention without any acute process or evidence of bowel obstruction.   Document Released: 10/10/2000 Document Revised: 02/05/2014 Document Reviewed: 05/27/2013 Elsevier Interactive Patient Education [employer]2016 [employer]vier Inc.

## 2015-05-14 NOTE — ED Notes (Signed)
[facility]

## 2015-05-14 NOTE — ED Provider Notes (Signed)
Results for orders placed or performed during the hospital encounter of 05/13/15  Lipase, blood  Result Value Ref Range   Lipase 19 11 - 51 U/L  Basic metabolic panel  Result Value Ref Range   Sodium 139 135 - 145 mmol/L   Potassium 3.4 (L) 3.5 - 5.1 mmol/L   Chloride 100 (L) 101 - 111 mmol/L   CO2 28 22 - 32 mmol/L   Glucose, Bld 113 (H) 65 - 99 mg/dL   BUN 10 6 - 20 mg/dL   Creatinine, Ser 2.13 0.44 - 1.00 mg/dL   Calcium 9.7 8.9 - 08.6 mg/dL   GFR calc non Af Amer >60 >60 mL/min   GFR calc Af Amer >60 >60 mL/min   Anion gap 11 5 - 15  CBC with Differential/Platelet  Result Value Ref Range   WBC 8.0 4.0 - 10.5 K/uL   RBC 4.64 3.87 - 5.11 MIL/uL   Hemoglobin 12.6 12.0 - 15.0 g/dL   HCT 57.8 46.9 - 62.9 %   MCV 86.9 78.0 - 100.0 fL   MCH 27.2 26.0 - 34.0 pg   MCHC 31.3 30.0 - 36.0 g/dL   RDW 52.8 (H) 41.3 - 24.4 %   Platelets 309 150 - 400 K/uL   Neutrophils Relative % 57 %   Neutro Abs 4.5 1.7 - 7.7 K/uL   Lymphocytes Relative 31 %   Lymphs Abs 2.4 0.7 - 4.0 K/uL   Monocytes Relative 10 %   Monocytes Absolute 0.8 0.1 - 1.0 K/uL   Eosinophils Relative 2 %   Eosinophils Absolute 0.2 0.0 - 0.7 K/uL   Basophils Relative 0 %   Basophils Absolute 0.0 0.0 - 0.1 K/uL  Hepatic function panel  Result Value Ref Range   Total Protein 7.4 6.5 - 8.1 g/dL   Albumin 3.4 (L) 3.5 - 5.0 g/dL   AST 17 15 - 41 U/L   ALT 19 14 - 54 U/L   Alkaline Phosphatase 91 38 - 126 U/L   Total Bilirubin 0.6 0.3 - 1.2 mg/dL   Bilirubin, Direct 0.1 0.1 - 0.5 mg/dL   Indirect Bilirubin 0.5 0.3 - 0.9 mg/dL   Dg Abd 1 View  0/10/2723  CLINICAL DATA:  Evaluate for free intraperitoneal air. EXAM: ABDOMEN - 1 VIEW COMPARISON:  Earlier same day abdominal radiograph FINDINGS: Two large areas of lucency are demonstrated non dependently on the decubitus images. These may represent either massively distended loops of bowel or potentially free intraperitoneal air. Oral contrast material is demonstrated within  the bowel. Lung bases are clear. IMPRESSION: Lucency within the nondependent location on the decubitus view may resent markedly dilated loops of small bowel or free intraperitoneal air. Given that these findings are significantly changed when compared with more remote abdominal radiographs, and free intraperitoneal air is of concern, recommend evaluation with dedicated abdominal CT. Critical Value/emergent results were called by telephone at the time of interpretation on 05/13/2015 at 6:53 pm to Dr. Kizzie Furnish, who verbally acknowledged these results. Electronically Signed   By: Annia Belt M.D.   On: 05/13/2015 18:59   Dg Abd 1 View  05/13/2015  CLINICAL DATA:  Confirm G tube placement. EXAM: ABDOMEN - 1 VIEW COMPARISON:  None. FINDINGS: Foley catheter balloon appears located within the stomach as confirmed with injection of contrast material. There is a large amount of lucency overlying the central abdomen. Large amount of stool within the rectum. Suggestion of dilatation of multiple small bowel loops within the central abdomen. Unremarkable osseous  skeleton. IMPRESSION: Large amount of lucency within the central aspect of the abdomen raises the possibility of free intraperitoneal air. Recommend additional evaluation with upright or decubitus images. Foley catheter balloon appears in appropriate position. Large amount of stool in the rectum as can be seen with constipation. Critical Value/emergent results were called by telephone at the time of interpretation on 05/13/2015 at 5:20 pm to Dr. Clydene PughKnott, who verbally acknowledged these results. Electronically Signed   By: Annia Beltrew  Davis M.D.   On: 05/13/2015 17:24   Ct Abdomen Pelvis W Contrast  05/13/2015  CLINICAL DATA:  Abdominal distension, GI distress. EXAM: CT ABDOMEN AND PELVIS WITH CONTRAST TECHNIQUE: Multidetector CT imaging of the abdomen and pelvis was performed using the standard protocol following bolus administration of intravenous contrast. CONTRAST:  80  cc Isovue-300. COMPARISON:  None. FINDINGS: Lower chest: Lung bases show no acute findings. Heart is mildly enlarged. Coronary artery calcification. No pericardial or pleural effusion. Hepatobiliary: Liver and gallbladder are unremarkable. No biliary ductal dilatation. Pancreas: Negative. Spleen: Negative. Adrenals/Urinary Tract: Adrenal glands and kidneys are unremarkable. Ureters are decompressed. Foley catheter is seen in a decompressed bladder. Stomach/Bowel: Percutaneous gastrostomy. Stomach, duodenum and jejunum are decompressed. There are rather dilated and contrast filled loops of small bowel in the mid abdomen, with rapid decompression to normal caliber small bowel in the low central abdomen (series 201, images 56-57), as well as swirling of the decompressed small bowel, vessels and mesenteric fat (images 59-72). Distal ileum appears decompressed. Contrast passes into the colon. Vascular/Lymphatic: Atherosclerotic calcification of the arterial vasculature without abdominal aortic aneurysm. No pathologically enlarged lymph nodes. Reproductive: Uterus appears to be surgically absent. Ovaries are visualized. Other: Mild presacral edema. No free fluid. Mesenteries and peritoneum are otherwise unremarkable. Musculoskeletal: No worrisome lytic or sclerotic lesions. IMPRESSION: 1. Dilated mid small bowel with decompressed proximal and distal small bowel. Rapid decompression to normal caliber small bowel with a twisted appearance of the adjacent mesentery and vasculature, raising the question of a small bowel mesentery-type internal hernia with at least partial obstruction. Contrast does pass into the colon. 2. Coronary artery calcification. Electronically Signed   By: Leanna BattlesMelinda  Blietz M.D.   On: 05/13/2015 23:13   Ir Replc Gastro/colonic Tube Percut W/fluoro  05/11/2015  INDICATION: Gastrostomy malfunction, accidentally removed, chronic tube feeds EXAM: GASTROSTOMY CATHETER REPLACEMENT MEDICATIONS: None.  ANESTHESIA/SEDATION: None. CONTRAST:  10 ISOVUE-300 IOPAMIDOL (ISOVUE-300) INJECTION 61% - administered into the gastric lumen. FLUOROSCOPY TIME:  Fluoroscopy Time: 30 second COMPLICATIONS: None immediate. PROCEDURE: Informed written consent was obtained from the patient after a thorough discussion of the procedural risks, benefits and alternatives. All questions were addressed. Maximal Sterile Barrier Technique was utilized including caps, mask, sterile gowns, sterile gloves, sterile drape, hand hygiene and skin antiseptic. A timeout was performed prior to the initiation of the procedure. the existing percutaneous tract was cannulated with a catheter. Amplatz guidewire inserted. Tract dilatation performed a 20 [place]French. Eighteen French balloon retention gastrostomy advanced. Retention balloon inflated with 10 cc saline and retracted against anterior gastric wall. This was secured externally. Contrast injection confirms position. IMPRESSION: Successful fluoroscopic replacement of an 6418 French balloon retention gastrostomy Electronically Signed   By: Judie PetitM.  Shick M.D.   On: 05/11/2015 10:00   Dg Swallowing Func-speech Pathology  04/14/2015  Objective Swallowing Evaluation: Type of Study: MBS-Modified Barium Swallow Study Patient Details Name: Rachel Vang MRN: [id number] Date of Birth: 07/06/1965 Today's Date: 04/14/2015 Time: SLP Start Time (ACUTE ONLY): 1126-SLP Stop Time (ACUTE ONLY): 1152 SLP Time  Calculation (min) (ACUTE ONLY): 26 min Past Medical History: Past Medical History Diagnosis Date . Hypertension  . Acute respiratory failure (HCC)  . Epilepsy (HCC)  . GERD (gastroesophageal reflux disease)  . Nontraumatic subarachnoid hemorrhage (HCC)  . Urinary retention  . Dysphagia  . Hyperlipidemia  . IBS (irritable bowel syndrome)  . Diabetes mellitus without complication (HCC)    Type 2, W/o complications Past Surgical History: Past Surgical History Procedure Laterality Date . Radiology with anesthesia N/A  01/13/2015   Procedure: RADIOLOGY WITH ANESTHESIA;  Surgeon: Lisbeth Renshaw, MD;  [facility] OR;  Service: Radiology;  Laterality: N/A; . Esophagogastroduodenoscopy (egd) with propofol N/A 02/02/2015   Procedure: ESOPHAGOGASTRODUODENOSCOPY (EGD) WITH PROPOFOL;  Surgeon: Jimmye Norman, MD;  Location[facility] ENDOSCOPY;  Service: General;  Laterality: N/A; . Peg placement N/A 02/02/2015   Procedure: PERCUTANEOUS ENDOSCOPIC GASTROSTOMY (PEG) PLACEMENT;  Surgeon: Jimmye Norman, MD;  Location: [facility] ENDOSCOPY;  Service: General;  Laterality: N/A; . Abdominal surgery   . Aneurysm coiling   . Tracheostomy   . Colonoscopy N/A 02/20/2015   Procedure: COLONOSCOPY;  Surgeon: Iva Boop, MD;  Location: [facility] ENDOSCOPY;  Service: Endoscopy;  Laterality: N/A; HPI: 50 yo female with h/o ICH in 12/2014, now with PEG, s/p trach/decannulation. Note from SLP at SNF indicates that she has been tolerating trials of ice chips and thin liquids. She presents for OP MBS to determine readiness for a PO diet. Subjective: pt alert, not consistently following commands or communicating Assessment / Plan / Recommendation CHL IP CLINICAL IMPRESSIONS 04/14/2015 Therapy Diagnosis Moderate oral phase dysphagia;Severe oral phase dysphagia;Mild pharyngeal phase dysphagia Clinical Impression Pt has a moderate-severe oral dysphagia with mild pharyngeal impairments. Mod-Max cues provided for alertness/sustained attention and initiation of swallow with nectar thick liquids and purees. Physical assist provided for self-feeding provided minimal improvements in bolus awareness with thicker consistencies. Pt did have improved automaticity and timing with self-fed trials of thin liquids with no airway compromise observed. Recommend to initiate thin liquid diet. SLP may wish to try therapeutic trials of puree as well.  Impact on safety and function Moderate aspiration risk   CHL IP TREATMENT RECOMMENDATION 04/14/2015 Treatment Recommendations Defer treatment plan to f/u with  SLP   Prognosis 04/14/2015 Prognosis for Safe Diet Advancement Fair Barriers to Reach Goals Severity of deficits;Cognitive deficits Barriers/Prognosis Comment -- CHL IP DIET RECOMMENDATION 04/14/2015 SLP Diet Recommendations Thin liquid Liquid Administration via Cup;Straw Medication Administration Other (Comment) Compensations Minimize environmental distractions;Slow rate;Small sips/bites Postural Changes Remain semi-upright after after feeds/meals (Comment)   CHL IP OTHER RECOMMENDATIONS 04/14/2015 Recommended Consults -- Oral Care Recommendations Oral care BID Other Recommendations --   CHL IP FOLLOW UP RECOMMENDATIONS 04/14/2015 Follow up Recommendations Skilled Nursing facility   No flowsheet data found.     CHL IP ORAL PHASE 04/14/2015 Oral Phase Impaired Oral - Pudding Teaspoon -- Oral - Pudding Cup -- Oral - Honey Teaspoon -- Oral - Honey Cup -- Oral - Nectar Teaspoon -- Oral - Nectar Cup Reduced posterior propulsion;Holding of bolus;Lingual/palatal residue;Delayed oral transit Oral - Nectar Straw -- Oral - Thin Teaspoon -- Oral - Thin Cup Delayed oral transit Oral - Thin Straw Delayed oral transit Oral - Puree Reduced posterior propulsion;Holding of bolus;Lingual/palatal residue;Delayed oral transit Oral - Mech Soft -- Oral - Regular -- Oral - Multi-Consistency -- Oral - Pill -- Oral Phase - Comment --  CHL IP PHARYNGEAL PHASE 04/14/2015 Pharyngeal Phase Impaired Pharyngeal- Pudding Teaspoon -- Pharyngeal -- Pharyngeal- Pudding Cup -- Pharyngeal -- Pharyngeal- Honey Teaspoon --  Pharyngeal -- Pharyngeal- Honey Cup -- Pharyngeal -- Pharyngeal- Nectar Teaspoon -- Pharyngeal -- Pharyngeal- Nectar Cup Delayed swallow initiation-vallecula;Penetration/Aspiration before swallow Pharyngeal Material enters airway, remains ABOVE vocal cords then ejected out Pharyngeal- Nectar Straw -- Pharyngeal -- Pharyngeal- Thin Teaspoon -- Pharyngeal -- Pharyngeal- Thin Cup Delayed swallow initiation-vallecula Pharyngeal -- Pharyngeal-  Thin Straw Delayed swallow initiation-vallecula Pharyngeal -- Pharyngeal- Puree Delayed swallow initiation-vallecula Pharyngeal -- Pharyngeal- Mechanical Soft -- Pharyngeal -- Pharyngeal- Regular -- Pharyngeal -- Pharyngeal- Multi-consistency -- Pharyngeal -- Pharyngeal- Pill -- Pharyngeal -- Pharyngeal Comment --  CHL IP CERVICAL ESOPHAGEAL PHASE 04/14/2015 Cervical Esophageal Phase WFL Pudding Teaspoon -- Pudding Cup -- Honey Teaspoon -- Honey Cup -- Nectar Teaspoon -- Nectar Cup -- Nectar Straw -- Thin Teaspoon -- Thin Cup -- Thin Straw -- Puree -- Mechanical Soft -- Regular -- Multi-consistency -- Pill -- Cervical Esophageal Comment -- CHL IP GO 04/14/2015 Functional Assessment Tool Used skilled clinical judgment Functional Limitations Swallowing Swallow Current Status (Z6109) CL Swallow Goal Status (U0454) CL Swallow Discharge Status (U9811) CL Motor Speech Current Status (B1478) (None) Motor Speech Goal Status (G9562) (None) Motor Speech Goal Status (Z3086) (None) Spoken Language Comprehension Current Status (V7846) (None) Spoken Language Comprehension Goal Status (N6295) (None) Spoken Language Comprehension Discharge Status (M8413) (None) Spoken Language Expression Current Status (K4401) (None) Spoken Language Expression Goal Status (U2725) (None) Spoken Language Expression Discharge Status (D6644) (None) Attention Current Status (I3474) (None) Attention Goal Status (Q5956) (None) Attention Discharge Status (L8756) (None) Memory Current Status (E3329) (None) Memory Goal Status (J1884) (None) Memory Discharge Status (Z6606) (None) Voice Current Status (T0160) (None) Voice Goal Status (F0932) (None) Voice Discharge Status (T5573) (None) Other Speech-Language Pathology Functional Limitation (U2025) (None) Other Speech-Language Pathology Functional Limitation Goal Status (K2706) (None) Other Speech-Language Pathology Functional Limitation Discharge Status [phone]) (None) Maxcine Ham, M.A. CCC-SLP [phone]  Maxcine Ham 04/14/2015, 12:18 PM               The patient had her G-tube replaced by interventional radiology. An extensive workup for concerns for air-fluid levels in the abdomen or bowel obstruction eventually led to CT scan. CT scan reviewed by on call general surgery Dr. remarriage. Stated that is no evidence of any bowel obstruction or any acute process. We feel this is all chronic. Patient without any vomiting patient without any significant abdominal distention. Feel this is all baseline based on her history of the nontraumatic traumatic subarachnoid hemorrhage and her bedridden state. Patient is stable for discharge back to the [facility] nursing facility. The patient does have her G-tube replaced. And it can be used  Vanetta Mulders, MD 05/14/15 [phone]

## 2015-05-14 NOTE — ED Notes (Signed)
[facility].  [facility]

## 2015-05-18 LAB — POCT INR: INR: 2 — AB (ref <=1.1)

## 2015-05-18 LAB — PROTIME-INR
PROTIME: 22.6 s — AB (ref 10.0–13.8)
Protime: 22.6 seconds — AB (ref 10.0–13.8)

## 2015-05-20 DIAGNOSIS — I10 Essential (primary) hypertension: Secondary | ICD-10-CM | POA: Insufficient documentation

## 2015-05-20 DIAGNOSIS — Z931 Gastrostomy status: Secondary | ICD-10-CM | POA: Insufficient documentation

## 2015-05-20 DIAGNOSIS — E1149 Type 2 diabetes mellitus with other diabetic neurological complication: Secondary | ICD-10-CM | POA: Insufficient documentation

## 2015-05-25 LAB — PROTIME-INR: Protime: 36.3 seconds — AB (ref 10.0–13.8)

## 2015-05-25 LAB — POCT INR: INR: 3.6 — AB (ref <=1.1)

## 2015-06-22 ENCOUNTER — Emergency Department (HOSPITAL_COMMUNITY): Payer: Medicaid Other

## 2015-06-22 ENCOUNTER — Encounter (HOSPITAL_COMMUNITY): Payer: Self-pay | Admitting: Emergency Medicine

## 2015-06-22 ENCOUNTER — Emergency Department (HOSPITAL_COMMUNITY)
Admission: EM | Admit: 2015-06-22 | Discharge: 2015-06-23 | Disposition: A | Discharge status: Home / Self Care | Payer: Medicaid Other | Attending: Emergency Medicine | Admitting: Emergency Medicine

## 2015-06-22 DIAGNOSIS — Z7901 Long term (current) use of anticoagulants: Secondary | ICD-10-CM | POA: Diagnosis not present

## 2015-06-22 DIAGNOSIS — Z794 Long term (current) use of insulin: Secondary | ICD-10-CM | POA: Insufficient documentation

## 2015-06-22 DIAGNOSIS — E119 Type 2 diabetes mellitus without complications: Secondary | ICD-10-CM | POA: Diagnosis not present

## 2015-06-22 DIAGNOSIS — Z87891 Personal history of nicotine dependence: Secondary | ICD-10-CM | POA: Insufficient documentation

## 2015-06-22 DIAGNOSIS — K9423 Gastrostomy malfunction: Secondary | ICD-10-CM | POA: Diagnosis present

## 2015-06-22 DIAGNOSIS — I10 Essential (primary) hypertension: Secondary | ICD-10-CM | POA: Insufficient documentation

## 2015-06-22 DIAGNOSIS — E785 Hyperlipidemia, unspecified: Secondary | ICD-10-CM | POA: Diagnosis not present

## 2015-06-22 MED ORDER — DIATRIZOATE MEGLUMINE & SODIUM 66-10 % PO SOLN
30.0000 mL | Freq: Once | ORAL | Status: AC
  Administered 2015-06-22: 30 mL via ORAL

## 2015-06-22 NOTE — ED Provider Notes (Signed)
CSN: [id number]     Arrival date & time 06/22/15  2058 History   First MD Initiated Contact with Patient 06/22/15 2109     Chief Complaint  Patient presents with  . pulled out peg tube      (Consider location/radiation/quality/duration/timing/severity/associated sxs/prior Treatment) HPI Comments: 50 year old female presents for G-tube displacement. Patient is not able to give any history. She is reportedly at her mental status baseline. It was noticed at her nursing facility that her G-tube was pulled out reportedly about an hour and a half ago.   Past Medical History  Diagnosis Date  . Hypertension   . Acute respiratory failure (HCC)   . Epilepsy (HCC)   . GERD (gastroesophageal reflux disease)   . Nontraumatic subarachnoid hemorrhage (HCC)   . Urinary retention   . Dysphagia   . Hyperlipidemia   . IBS (irritable bowel syndrome)   . Diabetes mellitus without complication (HCC)     Type 2, W/o complications   Past Surgical History  Procedure Laterality Date  . Radiology with anesthesia N/A 01/13/2015    Procedure: RADIOLOGY WITH ANESTHESIA;  Surgeon: Lisbeth RenshawNeelesh Nundkumar, MD;  [facility];  Service: Radiology;  Laterality: N/A;  . Esophagogastroduodenoscopy (egd) with propofol N/A 02/02/2015    Procedure: ESOPHAGOGASTRODUODENOSCOPY (EGD) WITH PROPOFOL;  Surgeon: Jimmye NormanJames Wyatt, MD;  [facility];  Service: General;  Laterality: N/A;  . Peg placement N/A 02/02/2015    Procedure: PERCUTANEOUS ENDOSCOPIC GASTROSTOMY (PEG) PLACEMENT;  Surgeon: Jimmye NormanJames Wyatt, MD;  [facility];  Service: General;  Laterality: N/A;  . Abdominal surgery    . Aneurysm coiling    . Tracheostomy    . Colonoscopy N/A 02/20/2015    Procedure: COLONOSCOPY;  Surgeon: Iva Booparl E Gessner, MD;  [facility];  Service: Endoscopy;  Laterality: N/A;   History reviewed. No pertinent family history. Social History  Substance Use Topics  . Smoking status: Former Games developermoker  . Smokeless tobacco:  Never Used  . Alcohol Use: No   OB History    No data available     Review of Systems  Unable to perform ROS: Other  Altered mental status at baseline    Allergies  Review of patient's allergies indicates no known allergies.  Home Medications   Prior to Admission medications   Medication Sig Start Date End Date Taking? Authorizing Provider  amLODipine (NORVASC) 10 MG tablet Take 1 tablet (10 mg total) by mouth daily. Patient taking differently: Place 10 mg into feeding tube daily.  02/11/15  Yes Leroy SeaPrashant K Singh, MD  bethanechol (URECHOLINE) 10 MG tablet Take 1 tablet (10 mg total) by mouth every 8 (eight) hours. Patient taking differently: Place 10 mg into feeding tube every 8 (eight) hours. Midnight, 8am, 4pm 02/11/15  Yes Leroy SeaPrashant K Singh, MD  cloNIDine (CATAPRES) 0.1 MG tablet Take 1 tablet (0.1 mg total) by mouth 2 (two) times daily. Patient taking differently: Place 0.1 mg into feeding tube 2 (two) times daily.  02/22/15  Yes Leana Roeawood S Elgergawy, MD  insulin aspart (NOVOLOG) 100 UNIT/ML injection Inject 0-9 Units into the skin every 4 (four) hours. Patient taking differently: Inject 5 Units into the skin every 4 (four) hours. For cbg >=150 02/22/15  Yes Leana Roeawood S Elgergawy, MD  insulin glargine (LANTUS) 100 UNIT/ML injection Inject 0.1 mLs (10 Units total) into the skin daily. Patient taking differently: Inject 10 Units into the skin at bedtime.  02/22/15  Yes Starleen Armsawood S Elgergawy, MD  levETIRAcetam (KEPPRA) 100 MG/ML solution Place 5  mLs (500 mg total) into feeding tube 2 (two) times daily. 02/11/15  Yes Leroy Sea, MD  metoprolol tartrate (LOPRESSOR) 25 mg/10 mL SUSP Place 50 mLs (125 mg total) into feeding tube 2 (two) times daily. 03/22/15  Yes Simonne Martinet, NP  Nutritional Supplements (NUTRITIONAL SUPPLEMENT PO) Place 30 mLs into feeding tube 2 (two) times daily. "protein liquid" to promote wound healing   Yes Historical Provider, MD  warfarin (COUMADIN) 10 MG tablet Take 10  mg by mouth at bedtime.    Yes Historical Provider, MD  acetaminophen (TYLENOL) 325 MG tablet Place 2 tablets (650 mg total) into feeding tube every 4 (four) hours as needed for mild pain (temp > 101.5). Patient not taking: Reported on 06/22/2015 03/22/15   Simonne Martinet, NP  Amino Acids-Protein Hydrolys (FEEDING SUPPLEMENT, PRO-STAT SUGAR FREE 64,) LIQD Place 30 mLs into feeding tube 2 (two) times daily. Patient not taking: Reported on 05/13/2015 02/11/15   Leroy Sea, MD  Nutritional Supplements (FEEDING SUPPLEMENT, JEVITY 1.2 CAL,) LIQD Place 1,000 mLs into feeding tube continuous. Patient not taking: Reported on 06/22/2015 03/22/15   Simonne Martinet, NP   BP 112/84 mmHg  Pulse 104  Temp(Src) 98.1 F (36.7 C) (Oral)  Resp 16  SpO2 95%  LMP  (LMP Unknown) Physical Exam  ED Course  Gastrostomy tube replacement Date/Time: 06/22/2015 9:36 PM Performed by: Leta Baptist Authorized by: Leta Baptist Patient identity confirmed: verbally with patient and arm band Local anesthesia used: no Patient sedated: no Patient tolerance: Patient tolerated the procedure well with no immediate complications Comments: Unable to pass a 18 Fr G tube.  14 Fr foley passed without complication or resistance   (including critical care time) Labs Review Labs Reviewed - No data to display  Imaging Review Dg Abd 1 View  06/22/2015  CLINICAL DATA:  Evaluate for free peritoneal air. EXAM: ABDOMEN - 1 VIEW COMPARISON:  05/13/2015 and 06/22/2015 as well as CT 05/13/2015 FINDINGS: Left lateral decubitus films demonstrate patient's gastrostomy tube with tip just right of midline over the upper abdomen. Moderate air-fluid level over the right colon. No evidence of free peritoneal air. Contrast and stool within the colon. Remainder of the exam is unchanged. IMPRESSION: Nonobstructive bowel gas pattern.  No free peritoneal air. Electronically Signed   By: Elberta Fortis M.D.   On: 06/22/2015 22:48   Dg Abd 1  View  06/22/2015  CLINICAL DATA:  Status post reinsertion of PEG tube. Initial encounter. EXAM: ABDOMEN - 1 VIEW COMPARISON:  CT of the abdomen and pelvis from 05/13/2015 FINDINGS: The patient's G-tube appears to have been advanced since the prior study, with the balloon noted about the pylorus. Administered contrast is seen filling the duodenum. The visualized bowel gas pattern is grossly unremarkable. However, there is an unusual pattern of air along the left side of the abdomen, and free intra-abdominal air cannot be excluded. No acute osseous abnormalities are identified. IMPRESSION: 1. G-tube appears to have been advanced since the prior study, with the balloon seen about the pylorus. Administered contrast is seen filling the duodenum. 2. Unusual pattern of air along the left side of the abdomen, and free intra-abdominal air cannot be excluded. Upright or decubitus view of the abdomen is recommended for further evaluation. These results were called by telephone at the time of interpretation on 06/22/2015 at 10:09 pm to Dr. Tyrone Apple, who verbally acknowledged these results. Electronically Signed   By: Beryle Beams.D.  On: 06/22/2015 22:09   I have personally reviewed and evaluated these images and lab results as part of my medical decision-making.   EKG Interpretation None      MDM  Patient seen and evaluated in stable condition. Unable to pass gastrostomy tube. 14 French Foley catheter placed without difficulty. X-rays confirmed placement. Patient discharged back to her facility. An order was placed for placement of a gastrostomy tube outpatient by IR. Instruction was given on her discharge papers to call IR to arrange for this appointment. Final diagnoses:  PEG tube malfunction (HCC)    1. Gastrostomy tube displacement    Leta Baptist, MD 06/22/15 2336

## 2015-06-22 NOTE — ED Notes (Signed)
[facility]. EMS reports patient pulled out her peg tub about an hour and a half ago. EMS reports patient is non ambulatory and has AMS at baseline per nursing home. Patient is also on coumadin.

## 2015-06-22 NOTE — ED Notes (Signed)
[facility]

## 2015-06-22 NOTE — ED Notes (Signed)
Peg tube site cleaned and dressed with 4x4 dressing. Patient tolerated well.

## 2015-06-22 NOTE — ED Notes (Signed)
[facility] for [facility]

## 2015-06-22 NOTE — Discharge Instructions (Signed)
You were seen and evaluated tonight for dislodgment of your gastrostomy tube. This has been replaced with a Foley catheter to keep the tract open. This needs to be replaced with a proper gastrostomy tube. This was unable to be accomplished in the emergency department this evening. An order has been placed for you to have this done by the interventional radiologist outpatient. Please call the interventional radiology suite to arrange a time to have this completed.  Gastrostomy Tube Replacement A gastrostomy tube replacement is a procedure to change the tube that goes into your stomach. This may be a planned procedure, or it may be an emergency procedure if your tube has come out or is not working.  [facility] KNOW ABOUT:  Any allergies you have.  All medicines you are taking, including vitamins, herbs, eye drops, creams, and over-the-counter medicines.  Previous problems you or members of your family have had with the use of anesthetics.  Any blood disorders you have.  Any surgeries you have had.  Medical conditions you have. RISKS AND COMPLICATIONS Generally, this is a safe procedure. However, problems can occur and include:  Bleeding.  Infection.  Leaking. BEFORE THE PROCEDURE  Let your health care provider know how long you have had your gastrostomy tube. If your tube has been in place for less than two weeks, you may need another surgical placement procedure, not just a replacement.  If your tube has come out at home, bring the tube with you so your health care provider can see the type you are using. PROCEDURE  You may be given a medicine that numbs the insertion area (local anesthetic).  If your gastrostomy tube is partially displaced, or is in place but not working, it will be removed.  If you have an inflatable tube, your health care provider may deflate the balloon at the end of the tube with a syringe.  Your health care provider will apply pressure  to your belly as the tube is pulled out. Then the health care provider will gently probe the opening of your gastrostomy to check it.  The opening for the gastrostomy tube may be lubricated with a jelly-like ointment.  You will get a new tube.  If the new tube does not go in easily, you may have a smaller tube put in to keep the track open.  The new tube will be secured in place. AFTER THE PROCEDURE  You may have an X-ray to make sure the new tube is in the right place and is working well. To do this, your health care provider will put a liquid that shows up on X-rays through the tube. The X-ray checks that the fluid is not leaking outside of your stomach.   This information is not intended to replace advice given to you by your health care provider. Make sure you discuss any questions you have with your health care provider.   Document Released: 10/10/2000 Document Revised: 02/05/2014 Document Reviewed: 05/27/2013 Elsevier Interactive Patient Education [employer]2016 Elsevier Inc.

## 2015-06-23 ENCOUNTER — Encounter: Payer: Self-pay | Admitting: Internal Medicine

## 2015-06-23 ENCOUNTER — Non-Acute Institutional Stay (SKILLED_NURSING_FACILITY): Payer: Medicaid Other | Admitting: Internal Medicine

## 2015-06-23 DIAGNOSIS — I2692 Saddle embolus of pulmonary artery without acute cor pulmonale: Secondary | ICD-10-CM | POA: Diagnosis not present

## 2015-06-23 DIAGNOSIS — R569 Unspecified convulsions: Secondary | ICD-10-CM

## 2015-06-23 DIAGNOSIS — I609 Nontraumatic subarachnoid hemorrhage, unspecified: Secondary | ICD-10-CM

## 2015-06-23 NOTE — Progress Notes (Signed)
MRN: [id number] Name: Rachel Vang  Sex: female Age: 50 y.o. DOB: 1965/02/28  PSC #: Ronni Rumble Facility/Room:111 B Level Of Care: SNF Provider: Randon Goldsmith. Lyn Hollingshead, MD Emergency Contacts: Extended Emergency Contact Information Primary Emergency Contact: Keisling,Carlton  [place] Mobile Phone: [phone] Relation: Son Secondary Emergency Contact: Debroah Ball[place] Mobile Phone: [phone] Relation: Son  Code Status: Full Code  Allergies: Review of patient's allergies indicates no known allergies.  Chief Complaint  Patient presents with  . Medical Management of Chronic Issues    Routine Visit    HPI: Patient is 50 y.o. female who is ercovering from a devastating SAH who is being seen for routine issues of SAH, PE, and seizures.   Past Medical History  Diagnosis Date  . Hypertension   . Acute respiratory failure (HCC)   . Epilepsy (HCC)   . GERD (gastroesophageal reflux disease)   . Nontraumatic subarachnoid hemorrhage (HCC)   . Urinary retention   . Dysphagia   . Hyperlipidemia   . IBS (irritable bowel syndrome)   . Diabetes mellitus without complication (HCC)     Type 2, W/o complications    Past Surgical History  Procedure Laterality Date  . Radiology with anesthesia N/A 01/13/2015    Procedure: RADIOLOGY WITH ANESTHESIA;  Surgeon: Lisbeth Renshaw, MD;  Location: [facility] OR;  Service: Radiology;  Laterality: N/A;  . Esophagogastroduodenoscopy (egd) with propofol N/A 02/02/2015    Procedure: ESOPHAGOGASTRODUODENOSCOPY (EGD) WITH PROPOFOL;  Surgeon: Jimmye Norman, MD;  Location: [facility] ENDOSCOPY;  Service: General;  Laterality: N/A;  . Peg placement N/A 02/02/2015    Procedure: PERCUTANEOUS ENDOSCOPIC GASTROSTOMY (PEG) PLACEMENT;  Surgeon: Jimmye Norman, MD;  Location: [facility] ENDOSCOPY;  Service: General;  Laterality: N/A;  . Abdominal surgery    . Aneurysm coiling    . Tracheostomy    . Colonoscopy N/A 02/20/2015    Procedure:  COLONOSCOPY;  Surgeon: Iva Boop, MD;  Location: [facility] ENDOSCOPY;  Service: Endoscopy;  Laterality: N/A;      Medication List       This list is accurate as of: 06/23/15 11:59 PM.  Always use your most recent med list.               acetaminophen 325 MG tablet  Commonly known as:  TYLENOL  Place 2 tablets (650 mg total) into feeding tube every 4 (four) hours as needed for mild pain (temp > 101.5).     amLODipine 10 MG tablet  Commonly known as:  NORVASC  10 mg by Feeding Tube route daily.     bethanechol 10 MG tablet  Commonly known as:  URECHOLINE  10 mg by PEG Tube route every 8 (eight) hours.     cloNIDine 0.1 MG tablet  Commonly known as:  CATAPRES  0.1 mg by PEG Tube route 2 (two) times daily.     collagenase ointment  Commonly known as:  SANTYL  Apply to right heel topically every day shift for wound care     feeding supplement (PRO-STAT SUGAR FREE 64) Liqd  Place 30 mLs into feeding tube 2 (two) times daily.     insulin aspart 100 UNIT/ML injection  Commonly known as:  novoLOG  Inject 5 Units into the skin every 4 (four) hours. Give for blood sugar > 150     insulin glargine 100 UNIT/ML injection  Commonly known as:  LANTUS  Inject 10 Units into the skin at bedtime.     levETIRAcetam 100 MG/ML solution  Commonly known as:  KEPPRA  Place 5 mLs (500 mg total) into feeding tube 2 (two) times daily.     metoprolol tartrate 25 mg/10 mL Susp  Commonly known as:  LOPRESSOR  Place 50 mLs (125 mg total) into feeding tube 2 (two) times daily.     NUTRITIONAL SUPPLEMENT PO  Place 30 mLs into feeding tube 2 (two) times daily. "protein liquid" to promote wound healing     feeding supplement (JEVITY 1.5 CAL) Liqd  Give 1.5 @@ 80 cc/hr every 12 hours (6 pm - 6 am)     WARFARIN SODIUM PO  Take 9 mg by mouth at bedtime.        Meds ordered this encounter  Medications  . amLODipine (NORVASC) 10 MG tablet    Sig: 10 mg by Feeding Tube route daily.  .  bethanechol (URECHOLINE) 10 MG tablet    Sig: 10 mg by PEG Tube route every 8 (eight) hours.  . cloNIDine (CATAPRES) 0.1 MG tablet    Sig: 0.1 mg by PEG Tube route 2 (two) times daily.  Barron Alvine. WARFARIN SODIUM PO    Sig: Take 9 mg by mouth at bedtime.  . insulin aspart (NOVOLOG) 100 UNIT/ML injection    Sig: Inject 5 Units into the skin every 4 (four) hours. Give for blood sugar > 150  . insulin glargine (LANTUS) 100 UNIT/ML injection    Sig: Inject 10 Units into the skin at bedtime.  . collagenase (SANTYL) ointment    Sig: Apply to right heel topically every day shift for wound care  . Nutritional Supplements (FEEDING SUPPLEMENT, JEVITY 1.5 CAL,) LIQD    Sig: Give 1.5 @@ 80 cc/hr every 12 hours (6 pm - 6 am)    Immunization History  Administered Date(s) Administered  . Influenza-Unspecified 03/09/2015    Social History  Substance Use Topics  . Smoking status: Former Games developermoker  . Smokeless tobacco: Never Used  . Alcohol Use: No    Review of Systems  DATA OBTAINED: from  nurse GENERAL:  no fevers, fatigue, appetite changes SKIN: No itching, rash HEENT: No complaint RESPIRATORY: No cough, wheezing, SOB CARDIAC: No chest pain, palpitations, lower extremity edema  GI: No abdominal pain, No N/V/D or constipation, No heartburn or reflux  GU: No dysuria, frequency or urgency, or incontinence  MUSCULOSKELETAL: No unrelieved bone/joint pain NEUROLOGIC: No headache, dizziness  PSYCHIATRIC: No overt anxiety or sadness  Filed Vitals:   06/23/15 1134  BP: 121/70  Pulse: 72  Temp: 97.8 F (36.6 C)  Resp: 16    Physical Exam  GENERAL APPEARANCE: Alert,non conversant but did make eye contact, No acute distress  SKIN: No diaphoresis rash HEENT: Unremarkable; trach out RESPIRATORY: Breathing is even, unlabored. Lung sounds are clear   CARDIOVASCULAR: Heart RRR no murmurs, rubs or gallops. No peripheral edema  GASTROINTESTINAL: Abdomen is soft, non-tender, not distended w/ normal bowel  sounds; PEG tube  GENITOURINARY: Bladder non tender, not distended  MUSCULOSKELETAL: No abnormal joints or musculature NEUROLOGIC: Cranial nerves 2-12 grossly intact; R heiparesis PSYCHIATRIC: Mood and affect better than usual TBI, no behavioral issues   Patient Active Problem List   Diagnosis Date Noted  . Type II diabetes mellitus with neurological manifestations (HCC) 05/20/2015  . Essential hypertension, benign 05/20/2015  . S/P percutaneous endoscopic gastrostomy (PEG) tube placement (HCC) 05/20/2015  . DVT (deep venous thrombosis) (HCC) 04/05/2015  . Protein-calorie malnutrition, severe (HCC) 03/25/2015  . UTI (lower urinary tract infection)   . Acute pulmonary embolism (  HCC) 02/19/2015  . Lower GI bleeding 02/19/2015  . Dysphagia 02/18/2015  . Seizures (HCC) 02/18/2015  . Urinary retention 02/18/2015  . History of ETT   . Hydrocephalus   . Neurological abnormality   . Subarachnoid hemorrhage (HCC)   . SAH (subarachnoid hemorrhage) (HCC)     CBC    Component Value Date/Time   WBC 8.0 05/13/2015 1931   RBC 4.64 05/13/2015 1931   HGB 12.6 05/13/2015 1931   HCT 40.3 05/13/2015 1931   PLT 309 05/13/2015 1931   MCV 86.9 05/13/2015 1931   LYMPHSABS 2.4 05/13/2015 1931   MONOABS 0.8 05/13/2015 1931   EOSABS 0.2 05/13/2015 1931   BASOSABS 0.0 05/13/2015 1931    CMP     Component Value Date/Time   NA 139 05/13/2015 1931   K 3.4* 05/13/2015 1931   CL 100* 05/13/2015 1931   CO2 28 05/13/2015 1931   GLUCOSE 113* 05/13/2015 1931   BUN 10 05/13/2015 1931   CREATININE 0.69 05/13/2015 1931   CALCIUM 9.7 05/13/2015 1931   PROT 7.4 05/13/2015 1931   ALBUMIN 3.4* 05/13/2015 1931   AST 17 05/13/2015 1931   ALT 19 05/13/2015 1931   ALKPHOS 91 05/13/2015 1931   BILITOT 0.6 05/13/2015 1931   GFRNONAA >60 05/13/2015 1931   GFRAA >60 05/13/2015 1931    Assessment and Plan  SAH (subarachnoid hemorrhage) (HCC) Pt is making progress. She can sit up on BR to eat and I'm told  can speak although she didn't speak with me today.; will cont to monitor  Acute pulmonary embolism (HCC) Pt is till on coumadin which is being actively titrated  Seizures (HCC) No reported seizures;cont keppra 500 mg BID    Randon Goldsmith. Lyn Hollingshead, MD

## 2015-06-25 ENCOUNTER — Encounter: Payer: Self-pay | Admitting: Internal Medicine

## 2015-06-25 NOTE — Assessment & Plan Note (Signed)
Pt is making progress. She can sit up on BR to eat and I'm told can speak although she didn't speak with me today.; will cont to monitor

## 2015-06-25 NOTE — Assessment & Plan Note (Signed)
No reported seizures ; cont keppra 500 mg BID 

## 2015-06-25 NOTE — Assessment & Plan Note (Signed)
Pt is till on coumadin which is being actively titrated

## 2015-06-28 ENCOUNTER — Other Ambulatory Visit: Payer: Self-pay | Admitting: Physician Assistant

## 2015-06-28 ENCOUNTER — Encounter: Payer: Self-pay | Admitting: Adult Health

## 2015-06-28 ENCOUNTER — Non-Acute Institutional Stay (SKILLED_NURSING_FACILITY): Payer: Medicaid Other | Admitting: Adult Health

## 2015-06-28 DIAGNOSIS — I2692 Saddle embolus of pulmonary artery without acute cor pulmonale: Secondary | ICD-10-CM | POA: Diagnosis not present

## 2015-06-28 DIAGNOSIS — I609 Nontraumatic subarachnoid hemorrhage, unspecified: Secondary | ICD-10-CM | POA: Diagnosis not present

## 2015-06-28 DIAGNOSIS — I824Y2 Acute embolism and thrombosis of unspecified deep veins of left proximal lower extremity: Secondary | ICD-10-CM | POA: Diagnosis not present

## 2015-06-28 LAB — PROTIME-INR: PROTIME: 17.1 s — AB (ref 10.0–13.8)

## 2015-06-28 LAB — POCT INR: INR: 1.4 — AB (ref 0.9–1.1)

## 2015-06-28 NOTE — Progress Notes (Signed)
Patient ID: Rachel Vang, female   DOB: Jun 07, 1965, 50 y.o.   MRN: [id number]   Location:  [facility] Room Number: 111-B Place of Service:  SNF (31)   CODE STATUS: DNR  No Known Allergies  Chief Complaint  Patient presents with  . Acute Visit    Coumadin Management    HPI:  Her inr today is 1.4; on 06-24-15 was 1.4; she is presently taking 10 mg coumadin daily. She is not a candidate for xarelto or eliquis due to her history of bleed. I am concerned that she is not therapeutic despite being on a high dose of coumadin. I have spoken with Dr. Lyn Hollingshead regarding; we both agree that the most appropriate therapy for her is long term lovenox therapy. If she were to be discharged to home; she would need to be followed by the coumadin clinic.    Past Medical History  Diagnosis Date  . Hypertension   . Acute respiratory failure (HCC)   . Epilepsy (HCC)   . GERD (gastroesophageal reflux disease)   . Nontraumatic subarachnoid hemorrhage (HCC)   . Urinary retention   . Dysphagia   . Hyperlipidemia   . IBS (irritable bowel syndrome)   . Diabetes mellitus without complication (HCC)     Type 2, W/o complications    Past Surgical History  Procedure Laterality Date  . Radiology with anesthesia N/A 01/13/2015    Procedure: RADIOLOGY WITH ANESTHESIA;  Surgeon: Lisbeth Renshaw, MD;  [facility]: [facility] OR;  Service: Radiology;  Laterality: N/A;  . Esophagogastroduodenoscopy (egd) with propofol N/A 02/02/2015    Procedure: ESOPHAGOGASTRODUODENOSCOPY (EGD) WITH PROPOFOL;  Surgeon: Jimmye Norman, MD;  Location: [facility] ENDOSCOPY;  Service: General;  Laterality: N/A;  . Peg placement N/A 02/02/2015    Procedure: PERCUTANEOUS ENDOSCOPIC GASTROSTOMY (PEG) PLACEMENT;  Surgeon: Jimmye Norman, MD;  Location: [facility] ENDOSCOPY;  Service: General;  Laterality: N/A;  . Abdominal surgery    . Aneurysm coiling    . Tracheostomy    . Colonoscopy N/A 02/20/2015    Procedure: COLONOSCOPY;   Surgeon: Iva Boop, MD;  Location: [facility] ENDOSCOPY;  Service: Endoscopy;  Laterality: N/A;    Social History   Social History  . Marital Status: Single    Spouse Name: N/A  . Number of Children: N/A  . Years of Education: N/A   Occupational History  . Not on file.   Social History Main Topics  . Smoking status: Former Games developer  . Smokeless tobacco: Never Used  . Alcohol Use: No  . Drug Use: Not on file  . Sexual Activity: Not on file   Other Topics Concern  . Not on file   Social History Narrative   History reviewed. No pertinent family history.    VITAL SIGNS BP 121/70 mmHg  Pulse 72  Temp(Src) 97.8 F (36.6 C) (Oral)  Resp 16  Ht  (1.6 m)  Wt 136 lb 2 oz (61.746 kg)  BMI 24.12 kg/m2  SpO2 99%  LMP  (LMP Unknown)  Patient's Medications  New Prescriptions   No medications on file  Previous Medications   ACETAMINOPHEN (TYLENOL) 325 MG TABLET    Place 2 tablets (650 mg total) into feeding tube every 4 (four) hours as needed for mild pain (temp > 101.5).   AMINO ACIDS-PROTEIN HYDROLYS (FEEDING SUPPLEMENT, PRO-STAT SUGAR FREE 64,) LIQD    Place 30 mLs into feeding tube 2 (two) times daily.   AMLODIPINE (NORVASC) 10 MG TABLET  10 mg by Feeding Tube route daily.   BETHANECHOL (URECHOLINE) 10 MG TABLET    10 mg by PEG Tube route every 8 (eight) hours.   CLONIDINE (CATAPRES) 0.1 MG TABLET    0.1 mg by PEG Tube route 2 (two) times daily.   COLLAGENASE (SANTYL) OINTMENT    Apply to right heel topically every day shift for wound care   INSULIN ASPART (NOVOLOG) 100 UNIT/ML INJECTION    Inject 5 Units into the skin 4 (four) times daily. Give for blood sugar > 150   INSULIN GLARGINE (LANTUS) 100 UNIT/ML INJECTION    Inject 10 Units into the skin at bedtime.   LEVETIRACETAM (KEPPRA) 100 MG/ML SOLUTION    Place 5 mLs (500 mg total) into feeding tube 2 (two) times daily.   METOPROLOL TARTRATE (LOPRESSOR) 25 MG/10 ML SUSP    Place 50 mLs (125 mg total) into feeding tube 2  (two) times daily.   NUTRITIONAL SUPPLEMENTS (FEEDING SUPPLEMENT, JEVITY 1.5 CAL,) LIQD    Give 1.5 @@ 80 cc/hr every 12 hours (6 pm - 6 am)   NUTRITIONAL SUPPLEMENTS (NUTRITIONAL SUPPLEMENT PO)    Place 30 mLs into feeding tube 2 (two) times daily. "protein liquid" to promote wound healing   WARFARIN SODIUM PO    Take 9 mg by mouth at bedtime.  Modified Medications   No medications on file  Discontinued Medications   No medications on file     SIGNIFICANT DIAGNOSTIC EXAMS  02-17-15: ct angio of chest: 1. Extensive pulmonary emboli involving distal main pulmonary arteries extending into lobar, segmental and subsegmental sized branches throughout the lungs bilaterally. At this time, there are no overt findings to suggest right heart strain. 2. Cardiomegaly with left ventricular concentric hypertrophy. 3. Dependent atelectasis throughout the lower lobes of the lungs bilaterally.  02-18-15: bilateral lower extremity doppler: Findings consistent with acute deep vein thrombosis involving the left common femoral vein, left proximal profunda femoris vein, and left proximal femoral vein. Incidental findings are consistent with: enlarged lymph node on the right. - No evidence of deep vein thrombosis involving the right lower extremity. - No evidence of Baker&'s cyst on the right or left.  02-19-15: pelvic ultrasound: 1. Uterus not identified, presumed hysterectomy. No mass or free fluid seen within the midline pelvis. 2. Neither ovary is seen, perhaps bilateral oophorectomies, perhaps obscured by the fairly prominent fluid-filled bowel loops in the pelvis. No mass or free fluid seen within either adnexal region.  03-15-15: chest x-ray: 1. Tracheostomy tube is newly absent. No pneumomediastinum or pneumothorax. 2. Stable mild enlargement of the cardiopericardial silhouette. 3. Linear subsegmental atelectasis in the left lower lobe.  03-16-15: chest x-ray: Lungs clear.  Heart prominent but  stable   LABS REVIEWED:   02-08-15: hgb a1c 7.1 03-14-15: wbc 20.1; hgb 10.8; hct 34.2; mcv 90.2; plt 313; glucose 119; bun 13; creat 0.46; k+ 4.1; na++131; liver normal albumin 3.1 03-20-15: wbc 15.1; hgb 10.0; hct 32.0; mcv 92.8; plt 312; stool for c-diff: +  03-21-15: glucose 161; bun 9; creat 0.46; k+ 3.7; na++139  04-18-15: INR 3.4        Review of Systems  Unable to perform ROS: patient nonverbal     Physical Exam  Constitutional: No distress.  Eyes: Conjunctivae are normal.  Neck: Neck supple. No JVD present. No thyromegaly present.  Cardiovascular: Normal rate, regular rhythm and intact distal pulses.   Respiratory: Effort normal and breath sounds normal. No respiratory distress. She has no wheezes.  Her trach is out   GI: Soft. Bowel sounds are normal. She exhibits no distension. There is no tenderness.  Has peg tube  Musculoskeletal: She exhibits no edema.  Right hemiparesis   Lymphadenopathy:    She has no cervical adenopathy.  Neurological: She is alert.  Skin: Skin is warm and dry. She is not diaphoretic.  Psychiatric: She has a normal mood and affect.      ASSESSMENT/ PLAN:  1. CVA: subarachnoid hemorrhage: is neurologically without change. Has right hemiparesis;   2. PE and left lower extremity dvt: is presently stable will stop the coumadin therapy at this time and will start her on lovenol 60 mg twice daily and will continue to monitor her status.      Synthia Innocent NP [facility]  Contact [phone] Monday through Friday 8am- 5pm  After hours call [phone]

## 2015-06-29 ENCOUNTER — Ambulatory Visit (HOSPITAL_COMMUNITY)
Admission: RE | Admit: 2015-06-29 | Discharge: 2015-06-29 | Disposition: A | Discharge status: Home / Self Care | Payer: Medicaid Other | Source: Ambulatory Visit | Attending: Emergency Medicine | Admitting: Emergency Medicine

## 2015-06-29 ENCOUNTER — Other Ambulatory Visit (HOSPITAL_COMMUNITY): Payer: Self-pay | Admitting: Emergency Medicine

## 2015-06-29 DIAGNOSIS — R1310 Dysphagia, unspecified: Secondary | ICD-10-CM

## 2015-06-29 DIAGNOSIS — Z431 Encounter for attention to gastrostomy: Secondary | ICD-10-CM | POA: Insufficient documentation

## 2015-06-29 MED ORDER — LIDOCAINE VISCOUS 2 % MT SOLN
OROMUCOSAL | Status: AC
  Filled 2015-06-29: qty 15

## 2015-06-29 MED ORDER — IOPAMIDOL (ISOVUE-300) INJECTION 61%
10.0000 mL | Freq: Once | INTRAVENOUS | Status: AC | PRN
  Administered 2015-06-29: 10 mL

## 2015-06-29 NOTE — Procedures (Signed)
Successful replacement of the 16 fr gtube No comp Stable Ready for use

## 2015-07-25 ENCOUNTER — Encounter: Payer: Self-pay | Admitting: Internal Medicine

## 2015-07-25 ENCOUNTER — Non-Acute Institutional Stay (SKILLED_NURSING_FACILITY): Payer: Medicaid Other | Admitting: Internal Medicine

## 2015-07-25 DIAGNOSIS — E1149 Type 2 diabetes mellitus with other diabetic neurological complication: Secondary | ICD-10-CM

## 2015-07-25 DIAGNOSIS — J9611 Chronic respiratory failure with hypoxia: Secondary | ICD-10-CM

## 2015-07-25 DIAGNOSIS — I10 Essential (primary) hypertension: Secondary | ICD-10-CM | POA: Diagnosis not present

## 2015-07-25 NOTE — Progress Notes (Signed)
MRN: [id number] Name: Rachel Vang  Sex: female Age: 50 y.o. DOB: 05/11/1965  PSC #:  Facility/Room: [facility] / 111B Level Of Care: SNF Provider: Randon GoldsmithAnne D. Lyn HollingsheadAlexander, MD Emergency Contacts: Extended Emergency Contact Information Primary Emergency Contact: Pridgeon,Carlton  [place] Mobile Phone: [phone] Relation: Son Secondary Emergency Contact: Debroah BallerHagler,[name]  [place] Mobile Phone: [phone] Relation: Son  Code Status:   Allergies: Review of patient's allergies indicates no known allergies.  Chief Complaint  Patient presents with  . Medical Management of Chronic Issues    Routine Visit    HPI: Patient is 50 y.o. female who is being seen for routine issues of respiratory failure, HTN and DM2.  Past Medical History  Diagnosis Date  . Hypertension   . Acute respiratory failure (HCC)   . Epilepsy (HCC)   . GERD (gastroesophageal reflux disease)   . Nontraumatic subarachnoid hemorrhage (HCC)   . Urinary retention   . Dysphagia   . Hyperlipidemia   . IBS (irritable bowel syndrome)   . Diabetes mellitus without complication (HCC)     Type 2, W/o complications    Past Surgical History  Procedure Laterality Date  . Radiology with anesthesia N/A 01/13/2015    Procedure: RADIOLOGY WITH ANESTHESIA;  Surgeon: Lisbeth RenshawNeelesh Nundkumar, MD;  [facility];  Service: Radiology;  Laterality: N/A;  . Esophagogastroduodenoscopy (egd) with propofol N/A 02/02/2015    Procedure: ESOPHAGOGASTRODUODENOSCOPY (EGD) WITH PROPOFOL;  Surgeon: Jimmye NormanJames Wyatt, MD;  [facility];  Service: General;  Laterality: N/A;  . Peg placement N/A 02/02/2015    Procedure: PERCUTANEOUS ENDOSCOPIC GASTROSTOMY (PEG) PLACEMENT;  Surgeon: Jimmye NormanJames Wyatt, MD;  [facility];  Service: General;  Laterality: N/A;  . Abdominal surgery    . Aneurysm coiling    . Tracheostomy    . Colonoscopy N/A 02/20/2015    Procedure: COLONOSCOPY;  Surgeon: Iva Booparl E Gessner, MD;   [facility];  Service: Endoscopy;  Laterality: N/A;      Medication List       This list is accurate as of: 07/25/15 11:59 PM.  Always use your most recent med list.               acetaminophen 325 MG tablet  Commonly known as:  TYLENOL  Take 650 mg by mouth every 4 (four) hours as needed for mild pain or fever.     amLODipine 10 MG tablet  Commonly known as:  NORVASC  10 mg by Feeding Tube route daily.     bethanechol 10 MG tablet  Commonly known as:  URECHOLINE  10 mg by PEG Tube route every 8 (eight) hours.     cloNIDine 0.1 MG tablet  Commonly known as:  CATAPRES  0.1 mg by PEG Tube route 2 (two) times daily.     collagenase ointment  Commonly known as:  SANTYL  Apply to right heel topically every day shift for wound care     insulin aspart 100 UNIT/ML injection  Commonly known as:  novoLOG  Inject 5 Units into the skin 4 (four) times daily. Give for blood sugar > 150     insulin glargine 100 UNIT/ML injection  Commonly known as:  LANTUS  Inject 10 Units into the skin at bedtime.     levETIRAcetam 100 MG/ML solution  Commonly known as:  KEPPRA  Place 5 mLs (500 mg total) into feeding tube 2 (two) times daily.     LOVENOX 60 MG/0.6ML injection  Generic drug:  enoxaparin  Inject 60 mg into the skin every 12 (twelve) hours.     metoprolol tartrate 25 mg/10 mL Susp  Commonly known as:  LOPRESSOR  Place 50 mLs (125 mg total) into feeding tube 2 (two) times daily.     NUTRITIONAL SUPPLEMENT PO  Place 30 mLs into feeding tube 2 (two) times daily. "protein liquid" to promote wound healing     feeding supplement (JEVITY 1.5 CAL) Liqd  Give 1 can (250 ml) of Jevity 1.5 bolus via peg tube at midnight and also give 1.2 bolus feedings 6 times daily        Meds ordered this encounter  Medications  . Nutritional Supplements (FEEDING SUPPLEMENT, JEVITY 1.5 CAL,) LIQD    Sig: Give 1 can (250 ml) of Jevity 1.5 bolus via peg tube at midnight and also give  1.2 bolus feedings 6 times daily  . enoxaparin (LOVENOX) 60 MG/0.6ML injection    Sig: Inject 60 mg into the skin every 12 (twelve) hours.  Marland Kitchen acetaminophen (TYLENOL) 325 MG tablet    Sig: Take 650 mg by mouth every 4 (four) hours as needed for mild pain or fever.    Immunization History  Administered Date(s) Administered  . Influenza-Unspecified 03/09/2015    Social History  Substance Use Topics  . Smoking status: Former Games developer  . Smokeless tobacco: Never Used  . Alcohol Use: No    Review of Systems Pt can't fully participate-she speaks but it is very hard to get much info    Filed Vitals:   07/25/15 1238  BP: 101/64  Pulse: 86  Temp: 98.5 F (36.9 C)  Resp: 18    Physical Exam  GENERAL APPEARANCE: Alert, conversant, No acute distress  SKIN: No diaphoresis rash HEENT: Unremarkable RESPIRATORY: Breathing is even, unlabored. Lung sounds are clear ; trach out CARDIOVASCULAR: Heart RRR no murmurs, rubs or gallops. No peripheral edema  GASTROINTESTINAL: Abdomen is soft, non-tender, not distended w/ normal bowel sounds.;PEG out  GENITOURINARY: Bladder non tender, not distended  MUSCULOSKELETAL: No abnormal joints or musculature NEUROLOGIC: Cranial nerves 2-12 grossly intact except pt is verbal but most of it can't be understood by me, but still an improvement; R hemiparesis PSYCHIATRIC: Mood and affect appropriate to situation, no behavioral issues  Patient Active Problem List   Diagnosis Date Noted  . Chronic respiratory failure with hypoxia (HCC) 08/08/2015  . Type II diabetes mellitus with neurological manifestations (HCC) 05/20/2015  . Essential hypertension, benign 05/20/2015  . S/P percutaneous endoscopic gastrostomy (PEG) tube placement (HCC) 05/20/2015  . DVT (deep venous thrombosis) (HCC) 04/05/2015  . Protein-calorie malnutrition, severe (HCC) 03/25/2015  . UTI (lower urinary tract infection)   . Acute pulmonary embolism (HCC) 02/19/2015  . Lower GI bleeding  02/19/2015  . Dysphagia 02/18/2015  . Seizures (HCC) 02/18/2015  . Urinary retention 02/18/2015  . History of ETT   . Hydrocephalus   . Neurological abnormality   . Subarachnoid hemorrhage (HCC)   . SAH (subarachnoid hemorrhage) (HCC)     CBC    Component Value Date/Time   WBC 8.0 05/13/2015 1931   WBC 11.4 02/15/2015   RBC 4.64 05/13/2015 1931   HGB 12.6 05/13/2015 1931   HCT 40.3 05/13/2015 1931   PLT 309 05/13/2015 1931   MCV 86.9 05/13/2015 1931   LYMPHSABS 2.4 05/13/2015 1931   MONOABS 0.8 05/13/2015 1931   EOSABS 0.2 05/13/2015 1931   BASOSABS 0.0 05/13/2015 1931    CMP     Component Value Date/Time   NA  139 05/13/2015 1931   NA 141 02/15/2015   K 3.4* 05/13/2015 1931   CL 100* 05/13/2015 1931   CO2 28 05/13/2015 1931   GLUCOSE 113* 05/13/2015 1931   BUN 10 05/13/2015 1931   BUN 19 02/15/2015   CREATININE 0.69 05/13/2015 1931   CREATININE 0.5 02/15/2015   CALCIUM 9.7 05/13/2015 1931   PROT 7.4 05/13/2015 1931   ALBUMIN 3.4* 05/13/2015 1931   AST 17 05/13/2015 1931   ALT 19 05/13/2015 1931   ALKPHOS 91 05/13/2015 1931   BILITOT 0.6 05/13/2015 1931   GFRNONAA >60 05/13/2015 1931   GFRAA >60 05/13/2015 1931    Assessment and Plan  Hypertension: will continue clonidine 0.1 mg twice daily and norvasc 10 mg daily and lopressor 125 mg twice daily    Diabetes :will continue lantus 10 units daily; novolog SSI hgb a1c is 7.1  Chronic respiratory failure: her trach is out; she is maintaining her 02 sats. Will monitor    Morgan Rennert D. Lyn HollingsheadAlexander, MD

## 2015-08-08 ENCOUNTER — Encounter: Payer: Self-pay | Admitting: Internal Medicine

## 2015-08-08 ENCOUNTER — Non-Acute Institutional Stay (SKILLED_NURSING_FACILITY): Payer: Medicaid Other | Admitting: Internal Medicine

## 2015-08-08 DIAGNOSIS — Z1612 Extended spectrum beta lactamase (ESBL) resistance: Secondary | ICD-10-CM | POA: Diagnosis not present

## 2015-08-08 DIAGNOSIS — J9611 Chronic respiratory failure with hypoxia: Secondary | ICD-10-CM | POA: Insufficient documentation

## 2015-08-08 DIAGNOSIS — A498 Other bacterial infections of unspecified site: Secondary | ICD-10-CM | POA: Diagnosis not present

## 2015-08-08 NOTE — Progress Notes (Signed)
MRN: [id number] Name: Rachel Vang  Sex: female Age: 50 y.o. DOB: 1965-04-22  PSC #:  Facility/Room: [facility] / 111 B Level Of Care: SNF Provider: Randon Goldsmith. Lyn Hollingshead, MD Emergency Contacts: Extended Emergency Contact Information Primary Emergency Contact: Chartrand,Carlton  [place] Mobile Phone: [phone] Relation: Son Secondary Emergency Contact: Debroah Baller [place] Mobile Phone: [phone] Relation: Son  Code Status: Full Code  Allergies: Review of patient's allergies indicates no known allergies.  Chief Complaint  Patient presents with  . Acute Visit    Acute     HPI: Patient is 50 y.o. female who is being seen because her cx came back today with >100,00 ESBL E Coli. None of the nurses here know anything about why urine was done. They deny pt's MS is changed. Pt can't tell me relialy about any symptoms.  Past Medical History:  Diagnosis Date  . Acute respiratory failure (HCC)   . Diabetes mellitus without complication (HCC)    Type 2, W/o complications  . Dysphagia   . Epilepsy (HCC)   . GERD (gastroesophageal reflux disease)   . Hyperlipidemia   . Hypertension   . IBS (irritable bowel syndrome)   . Nontraumatic subarachnoid hemorrhage (HCC)   . Urinary retention     Past Surgical History:  Procedure Laterality Date  . ABDOMINAL SURGERY    . ANEURYSM COILING    . COLONOSCOPY N/A 02/20/2015   Procedure: COLONOSCOPY;  Surgeon: Iva Boop, MD;  Location: [facility] ENDOSCOPY;  Service: Endoscopy;  Laterality: N/A;  . ESOPHAGOGASTRODUODENOSCOPY (EGD) WITH PROPOFOL N/A 02/02/2015   Procedure: ESOPHAGOGASTRODUODENOSCOPY (EGD) WITH PROPOFOL;  Surgeon: Jimmye Norman, MD;  Location: [facility] ENDOSCOPY;  Service: General;  Laterality: N/A;  . IR GENERIC HISTORICAL  08/26/2015   IR GASTRIC TUBE PERC CHG W/O IMG GUIDE 08/26/2015 Irish Lack, MD [facility]-INTERV RAD  . PEG PLACEMENT N/A 02/02/2015   Procedure: PERCUTANEOUS ENDOSCOPIC GASTROSTOMY  (PEG) PLACEMENT;  Surgeon: Jimmye Norman, MD;  Location: [facility] ENDOSCOPY;  Service: General;  Laterality: N/A;  . RADIOLOGY WITH ANESTHESIA N/A 01/13/2015   Procedure: RADIOLOGY WITH ANESTHESIA;  Surgeon: Lisbeth Renshaw, MD;  Location: [facility] OR;  Service: Radiology;  Laterality: N/A;  . TRACHEOSTOMY        Medication List       Accurate as of 08/08/15 11:59 PM. Always use your most recent med list.          acetaminophen 325 MG tablet Commonly known as:  TYLENOL Take 650 mg by mouth every 4 (four) hours as needed for mild pain or fever.   amLODipine 10 MG tablet Commonly known as:  NORVASC 10 mg by Feeding Tube route daily.   bethanechol 10 MG tablet Commonly known as:  URECHOLINE 10 mg by PEG Tube route every 8 (eight) hours.   cloNIDine 0.1 MG tablet Commonly known as:  CATAPRES 0.1 mg by PEG Tube route 2 (two) times daily.   collagenase ointment Commonly known as:  SANTYL Apply 1 application topically 4 (four) times daily. Apply to right heel topically every day shift for wound care   insulin aspart 100 UNIT/ML injection Commonly known as:  novoLOG Inject 5 Units into the skin 4 (four) times daily. Reported on 08/12/2015   insulin glargine 100 UNIT/ML injection Commonly known as:  LANTUS Inject 10 Units into the skin at bedtime.   levETIRAcetam 100 MG/ML solution Commonly known as:  KEPPRA Place 5 mLs (500 mg total) into feeding tube 2 (two) times daily.  LOVENOX 60 MG/0.6ML injection Generic drug:  enoxaparin Inject 60 mg into the skin every 12 (twelve) hours.   metoprolol tartrate 25 mg/10 mL Susp Commonly known as:  LOPRESSOR Place 50 mLs (125 mg total) into feeding tube 2 (two) times daily.   NUTRITIONAL SUPPLEMENT PO Place 30 mLs into feeding tube 2 (two) times daily. "protein liquid" to promote wound healing   feeding supplement (JEVITY 1.5 CAL) Liqd Give 1 can (250 ml) of Jevity 1.5 bolus via peg tube at midnight and also give 1.2 bolus feedings 6 times  daily       No orders of the defined types were placed in this encounter.   Immunization History  Administered Date(s) Administered  . Influenza-Unspecified 03/09/2015    Social History  Substance Use Topics  . Smoking status: Former Games developermoker  . Smokeless tobacco: Never Used  . Alcohol use No    Review of Systems  UT fully participate; can say words, some make sense some don't     Vitals:   08/08/15 1601  BP: 110/64  Pulse: 89  Resp: 17  Temp: 97.9 F (36.6 C)    Physical Exam  GENERAL APPEARANCE: Alert, conversant, No acute distress  SKIN: No diaphoresis rash HEENT: Unremarkable RESPIRATORY: Breathing is even, unlabored. Lung sounds are clear   CARDIOVASCULAR: Heart RRR no murmurs, rubs or gallops. No peripheral edema  GASTROINTESTINAL: Abdomen is soft, non-tender, not distended w/ normal bowel sounds;PEG  GENITOURINARY: Bladder non tender, not distended  MUSCULOSKELETAL: No abnormal joints or musculature NEUROLOGIC: Cranial nerves 2-12 grossly intact;pt is verbal but most can't be understood; R hemiparesis PSYCHIATRIC: Mood and affect appropriate to situation, no behavioral issues  Patient Active Problem List   Diagnosis Date Noted  . Infection due to ESBL-producing Escherichia coli 08/27/2015  . Chronic respiratory failure with hypoxia (HCC) 08/08/2015  . Type II diabetes mellitus with neurological manifestations (HCC) 05/20/2015  . Essential hypertension, benign 05/20/2015  . S/P percutaneous endoscopic gastrostomy (PEG) tube placement (HCC) 05/20/2015  . DVT (deep venous thrombosis) (HCC) 04/05/2015  . Protein-calorie malnutrition, severe (HCC) 03/25/2015  . UTI (lower urinary tract infection)   . Acute pulmonary embolism (HCC) 02/19/2015  . Lower GI bleeding 02/19/2015  . Dysphagia 02/18/2015  . Seizures (HCC) 02/18/2015  . Urinary retention 02/18/2015  . History of ETT   . Hydrocephalus   . Neurological abnormality   . Subarachnoid hemorrhage  (HCC)   . SAH (subarachnoid hemorrhage) (HCC)     CBC    Component Value Date/Time   WBC 8.0 05/13/2015 1931   RBC 4.64 05/13/2015 1931   HGB 12.6 05/13/2015 1931   HCT 40.3 05/13/2015 1931   PLT 309 05/13/2015 1931   MCV 86.9 05/13/2015 1931   LYMPHSABS 2.4 05/13/2015 1931   MONOABS 0.8 05/13/2015 1931   EOSABS 0.2 05/13/2015 1931   BASOSABS 0.0 05/13/2015 1931    CMP     Component Value Date/Time   NA 139 05/13/2015 1931   NA 141 02/15/2015   K 3.4 (L) 05/13/2015 1931   CL 100 (L) 05/13/2015 1931   CO2 28 05/13/2015 1931   GLUCOSE 113 (H) 05/13/2015 1931   BUN 10 05/13/2015 1931   BUN 19 02/15/2015   CREATININE 0.69 05/13/2015 1931   CALCIUM 9.7 05/13/2015 1931   PROT 7.4 05/13/2015 1931   ALBUMIN 3.4 (L) 05/13/2015 1931   AST 17 05/13/2015 1931   ALT 19 05/13/2015 1931   ALKPHOS 91 05/13/2015 1931   BILITOT 0.6 05/13/2015  1931   GFRNONAA >60 05/13/2015 1931   GFRAA >60 05/13/2015 1931    Assessment and Plan  Infection due to ESBL-producing Escherichia coli Will tx with Ertpenam1 gm IM/IV/PICC for 10 days. It is late in the day so whatever access they can get is fine. If not an IV then will go with IM until a PICC can be placed.  Time spent > 25 min;> 50% of time with patient was spent reviewing records, labs, tests and studies, counseling and developing plan of care  Randon Goldsmith. Lyn Hollingshead, MD

## 2015-08-12 ENCOUNTER — Encounter: Payer: Self-pay | Admitting: Adult Health

## 2015-08-12 ENCOUNTER — Non-Acute Institutional Stay (SKILLED_NURSING_FACILITY): Payer: Medicaid Other | Admitting: Adult Health

## 2015-08-12 DIAGNOSIS — E1149 Type 2 diabetes mellitus with other diabetic neurological complication: Secondary | ICD-10-CM | POA: Diagnosis not present

## 2015-08-12 NOTE — Progress Notes (Signed)
Patient ID: Rachel MinksStephanie N Vang, female   DOB: 02/26/1965, 50 y.o.   MRN: [id number]   Location:   [facility] Room Number: 111-B Place of Service:  SNF (31)   CODE STATUS: Full Code  No Known Allergies  Chief Complaint  Patient presents with  . Acute Visit    Diabetes    HPI:  I have been asked to review her diabetes. Her readings are fairly well controlled.  She is peg tube dependent for her nutritional support. There are no reports of low glucose readings. She is unable to participate in the phi or ros.    Past Medical History:  Diagnosis Date  . Acute respiratory failure (HCC)   . Diabetes mellitus without complication (HCC)    Type 2, W/o complications  . Dysphagia   . Epilepsy (HCC)   . GERD (gastroesophageal reflux disease)   . Hyperlipidemia   . Hypertension   . IBS (irritable bowel syndrome)   . Nontraumatic subarachnoid hemorrhage (HCC)   . Urinary retention     Past Surgical History:  Procedure Laterality Date  . ABDOMINAL SURGERY    . ANEURYSM COILING    . COLONOSCOPY N/A 02/20/2015   Procedure: COLONOSCOPY;  Surgeon: Iva Booparl E Gessner, MD;  [facility];  Service: Endoscopy;  Laterality: N/A;  . ESOPHAGOGASTRODUODENOSCOPY (EGD) WITH PROPOFOL N/A 02/02/2015   Procedure: ESOPHAGOGASTRODUODENOSCOPY (EGD) WITH PROPOFOL;  Surgeon: Jimmye NormanJames Wyatt, MD;  [facility];  Service: General;  Laterality: N/A;  . IR GENERIC HISTORICAL  08/26/2015   IR GASTRIC TUBE PERC CHG W/O IMG GUIDE 08/26/2015 Irish LackGlenn Yamagata, MD [facility]-INTERV RAD  . PEG PLACEMENT N/A 02/02/2015   Procedure: PERCUTANEOUS ENDOSCOPIC GASTROSTOMY (PEG) PLACEMENT;  Surgeon: Jimmye NormanJames Wyatt, MD;  [facility];  Service: General;  Laterality: N/A;  . RADIOLOGY WITH ANESTHESIA N/A 01/13/2015   Procedure: RADIOLOGY WITH ANESTHESIA;  Surgeon: Lisbeth RenshawNeelesh Nundkumar, MD;  Location: [facility] OR;  Service: Radiology;  Laterality: N/A;  . TRACHEOSTOMY      Social History   Social History  . Marital  status: Single    Spouse name: N/A  . Number of children: N/A  . Years of education: N/A   Occupational History  . Not on file.   Social History Main Topics  . Smoking status: Former Games developermoker  . Smokeless tobacco: Never Used  . Alcohol use No  . Drug use: Unknown  . Sexual activity: Not on file   Other Topics Concern  . Not on file   Social History Narrative  . No narrative on file   History reviewed. No pertinent family history.    VITAL SIGNS BP 111/67   Pulse 71   Temp 98.4 F (36.9 C) (Oral)   Resp 19   Ht 5\' 3"  (1.6 m)   Wt 122 lb (55.3 kg)   LMP  (LMP Unknown)   SpO2 98%   BMI 21.61 kg/m   Patient's Medications  New Prescriptions   No medications on file  Previous Medications   ACETAMINOPHEN (TYLENOL) 325 MG TABLET    Take 650 mg by mouth every 4 (four) hours as needed for mild pain or fever.   AMLODIPINE (NORVASC) 10 MG TABLET    10 mg by Feeding Tube route daily.   BETHANECHOL (URECHOLINE) 10 MG TABLET    10 mg by PEG Tube route every 8 (eight) hours.    CLONIDINE (CATAPRES) 0.1 MG TABLET    0.1 mg by PEG Tube route 2 (two) times daily.  ENOXAPARIN (LOVENOX) 60 MG/0.6ML INJECTION    Inject 60 mg into the skin every 12 (twelve) hours.   INSULIN ASPART (NOVOLOG) 100 UNIT/ML INJECTION    Inject 0-5 Units into the skin 4 (four) times daily. Inject as per sliding scale: if  0-149 =0 units; 150-450 = 5 units, subcutaneously four times a day. Notify MD if CBG is < or = to 60, and if CBG is > or = to 450    INSULIN GLARGINE (LANTUS) 100 UNIT/ML INJECTION    Inject 10 Units into the skin at bedtime.   LEVETIRACETAM (KEPPRA) 100 MG/ML SOLUTION    Place 5 mLs (500 mg total) into feeding tube 2 (two) times daily.   METOPROLOL TARTRATE (LOPRESSOR) 25 MG/10 ML SUSP    Place 50 mLs (125 mg total) into feeding tube 2 (two) times daily.   MULTIPLE VITAMINS-MINERALS (DECUBI-VITE) CAPS    Take 1 capsule by mouth daily.   NUTRITIONAL SUPPLEMENTS (NUTRITIONAL SUPPLEMENT PO)     Place 30 mLs into feeding tube 2 (two) times daily. "protein liquid" to promote wound healing  Modified Medications   No medications on file  Discontinued Medications   COLLAGENASE (SANTYL) OINTMENT    Apply 1 application topically 4 (four) times daily. Apply to right heel topically every day shift for wound care    INSULIN ASPART (NOVOLOG) 100 UNIT/ML INJECTION    Inject 5 Units into the skin 4 (four) times daily. Reported on 08/12/2015   NUTRITIONAL SUPPLEMENTS (FEEDING SUPPLEMENT, JEVITY 1.5 CAL,) LIQD    Give 1 can (250 ml) of Jevity 1.5 bolus via peg tube at midnight and also give 1.2 bolus feedings 6 times daily     SIGNIFICANT DIAGNOSTIC EXAMS  02-17-15: ct angio of chest: 1. Extensive pulmonary emboli involving distal main pulmonary arteries extending into lobar, segmental and subsegmental sized branches throughout the lungs bilaterally. At this time, there are no overt findings to suggest right heart strain. 2. Cardiomegaly with left ventricular concentric hypertrophy. 3. Dependent atelectasis throughout the lower lobes of the lungs bilaterally.  02-18-15: bilateral lower extremity doppler: Findings consistent with acute deep vein thrombosis involving the left common femoral vein, left proximal profunda femoris vein, and left proximal femoral vein. Incidental findings are consistent with: enlarged lymph node on the right. - No evidence of deep vein thrombosis involving the right lower extremity. - No evidence of Baker&'s cyst on the right or left.  02-19-15: pelvic ultrasound: 1. Uterus not identified, presumed hysterectomy. No mass or free fluid seen within the midline pelvis. 2. Neither ovary is seen, perhaps bilateral oophorectomies, perhaps obscured by the fairly prominent fluid-filled bowel loops in the pelvis. No mass or free fluid seen within either adnexal region.  03-15-15: chest x-ray: 1. Tracheostomy tube is newly absent. No pneumomediastinum or pneumothorax. 2. Stable mild  enlargement of the cardiopericardial silhouette. 3. Linear subsegmental atelectasis in the left lower lobe.  03-16-15: chest x-ray: Lungs clear.  Heart prominent but stable   LABS REVIEWED:   02-08-15: hgb a1c 7.1 03-14-15: wbc 20.1; hgb 10.8; hct 34.2; mcv 90.2; plt 313; glucose 119; bun 13; creat 0.46; k+ 4.1; na++131; liver normal albumin 3.1 03-20-15: wbc 15.1; hgb 10.0; hct 32.0; mcv 92.8; plt 312; stool for c-diff: +  03-21-15: glucose 161; bun 9; creat 0.46; k+ 3.7; na++139  04-18-15: INR 3.4  08-06-15: urine culture; e-coli:       Review of Systems  Unable to perform ROS: patient nonverbal     Physical Exam  Constitutional: No distress.  Eyes: Conjunctivae are normal.  Neck: Neck supple. No JVD present. No thyromegaly present.  Cardiovascular: Normal rate, regular rhythm and intact distal pulses.   Respiratory: Effort normal and breath sounds normal. No respiratory distress. She has no wheezes.  GI: Soft. Bowel sounds are normal. She exhibits no distension. There is no tenderness.  Has peg tube  Musculoskeletal: She exhibits no edema.  Right hemiparesis   Lymphadenopathy:    She has no cervical adenopathy.  Neurological: She is alert.  Skin: Skin is warm and dry. She is not diaphoretic.  Psychiatric: She has a normal mood and affect.      ASSESSMENT/ PLAN:  1. Diabetes: hgb a1c is 7.1; will continue lantus 10 units daily and novolog 5 units every 6 hours for cbg >=150.    Will check cbc ;cmp; hgb a1c; lipids; micro-albumin     Synthia Innocent NP [facility]  Contact [phone] Monday through Friday 8am- 5pm  After hours call [phone]

## 2015-08-13 LAB — CBC AND DIFFERENTIAL
HCT: 36 % (ref 36–46)
Hemoglobin: 10.6 g/dL — AB (ref 12.0–16.0)
Platelets: 330 10*3/uL (ref 150–399)
WBC: 6.7 10^3/mL

## 2015-08-13 LAB — BASIC METABOLIC PANEL
BUN: 17 mg/dL (ref 4–21)
Creatinine: 0.7 mg/dL (ref 0.5–1.1)
GLUCOSE: 121 mg/dL
POTASSIUM: 4 mmol/L (ref 3.4–5.3)
SODIUM: 143 mmol/L (ref 137–147)

## 2015-08-13 LAB — HEPATIC FUNCTION PANEL
ALT: 10 U/L (ref 7–35)
AST: 13 U/L (ref 13–35)
Alkaline Phosphatase: 95 U/L (ref 25–125)
Bilirubin, Total: 0.2 mg/dL

## 2015-08-25 ENCOUNTER — Encounter (HOSPITAL_COMMUNITY): Payer: Self-pay | Admitting: Emergency Medicine

## 2015-08-25 ENCOUNTER — Emergency Department (HOSPITAL_COMMUNITY): Payer: Medicaid Other

## 2015-08-25 ENCOUNTER — Emergency Department (HOSPITAL_COMMUNITY)
Admission: EM | Admit: 2015-08-25 | Discharge: 2015-08-26 | Disposition: A | Discharge status: Home / Self Care | Payer: Medicaid Other | Attending: Emergency Medicine | Admitting: Emergency Medicine

## 2015-08-25 DIAGNOSIS — Z79899 Other long term (current) drug therapy: Secondary | ICD-10-CM | POA: Diagnosis not present

## 2015-08-25 DIAGNOSIS — I10 Essential (primary) hypertension: Secondary | ICD-10-CM | POA: Insufficient documentation

## 2015-08-25 DIAGNOSIS — E1149 Type 2 diabetes mellitus with other diabetic neurological complication: Secondary | ICD-10-CM | POA: Diagnosis not present

## 2015-08-25 DIAGNOSIS — Z794 Long term (current) use of insulin: Secondary | ICD-10-CM | POA: Insufficient documentation

## 2015-08-25 DIAGNOSIS — K9423 Gastrostomy malfunction: Secondary | ICD-10-CM | POA: Diagnosis not present

## 2015-08-25 DIAGNOSIS — Z87891 Personal history of nicotine dependence: Secondary | ICD-10-CM | POA: Insufficient documentation

## 2015-08-25 DIAGNOSIS — Z431 Encounter for attention to gastrostomy: Secondary | ICD-10-CM | POA: Diagnosis present

## 2015-08-25 MED ORDER — DIATRIZOATE MEGLUMINE & SODIUM 66-10 % PO SOLN
30.0000 mL | Freq: Once | ORAL | Status: AC
  Administered 2015-08-25: 30 mL via JEJUNOSTOMY

## 2015-08-25 NOTE — ED Provider Notes (Signed)
[facility] DEPT Provider Note   CSN: [id number] Arrival date & time: 08/25/15  1849  By signing my name below, I, Gwenlyn Fudge, attest that this documentation has been prepared under the direction and in the presence of [employer], PA-C. Electronically Signed: Gwenlyn Fudge, ED Scribe. 08/25/15. 10:37 PM.  First Provider Contact:  8:25 PM    LEVEL 5 CAVEAT:  DUE TO NEUROLOGICAL ABNORMALITY  History   Chief Complaint Chief Complaint  Patient presents with  . pulled out feeding tube    The history is provided by the patient and the nursing home. No language interpreter was used.    HPI Comments: Rachel Vang is a 50 y.o. female with PMHx  IBS, HTN, and GERD of who presents to the Emergency Department after displacing her feeding tube one hour PTA. Per nursing facility, pt is reported to have pulled out her feeding tube. Patient alert. She denies pain.   Past Medical History:  Diagnosis Date  . Acute respiratory failure ([place])   . Diabetes mellitus without complication (HCC)    Type 2, W/o complications  . Dysphagia   . Epilepsy ([place])   . GERD (gastroesophageal reflux disease)   . Hyperlipidemia   . Hypertension   . IBS (irritable bowel syndrome)   . Nontraumatic subarachnoid hemorrhage ([place])   . Urinary retention     Patient Active Problem List   Diagnosis Date Noted  . Chronic respiratory failure with hypoxia ([place]) 08/08/2015  . Type II diabetes mellitus with neurological manifestations ([place]) 05/20/2015  . Essential hypertension, benign 05/20/2015  . S/P percutaneous endoscopic gastrostomy (PEG) tube placement ([place]) 05/20/2015  . DVT (deep venous thrombosis) ([place]) 04/05/2015  . Protein-calorie malnutrition, severe ([place]) 03/25/2015  . UTI (lower urinary tract infection)   . Acute pulmonary embolism ([place]) 02/19/2015  . Lower GI bleeding 02/19/2015  . Dysphagia 02/18/2015  . Seizures ([place]) 02/18/2015  . Urinary retention 02/18/2015  . History of ETT   .  Hydrocephalus   . Neurological abnormality   . Subarachnoid hemorrhage ([place])   . SAH (subarachnoid hemorrhage) ([place])     Past Surgical History:  Procedure Laterality Date  . ABDOMINAL SURGERY    . ANEURYSM COILING    . COLONOSCOPY N/A 02/20/2015   Procedure: COLONOSCOPY;  Surgeon: Gatha Mayer, MD;  Location: [place];  Service: Endoscopy;  Laterality: N/A;  . ESOPHAGOGASTRODUODENOSCOPY (EGD) WITH PROPOFOL N/A 02/02/2015   Procedure: ESOPHAGOGASTRODUODENOSCOPY (EGD) WITH PROPOFOL;  Surgeon: Judeth Horn, MD;  Location[facility] ENDOSCOPY;  Service: General;  Laterality: N/A;  . PEG PLACEMENT N/A 02/02/2015   Procedure: PERCUTANEOUS ENDOSCOPIC GASTROSTOMY (PEG) PLACEMENT;  Surgeon: Judeth Horn, MD;  Location: [place];  Service: General;  Laterality: N/A;  . RADIOLOGY WITH ANESTHESIA N/A 01/13/2015   Procedure: RADIOLOGY WITH ANESTHESIA;  Surgeon: Consuella Lose, MD;  Location: [place];  Service: Radiology;  Laterality: N/A;  . TRACHEOSTOMY      OB History    No data available       Home Medications    Prior to Admission medications   Medication Sig Start Date End Date Taking? Authorizing Provider  acetaminophen (TYLENOL) 325 MG tablet Take 650 mg by mouth every 4 (four) hours as needed for mild pain or fever.   Yes Historical Provider, MD  amLODipine (NORVASC) 10 MG tablet 10 mg by Feeding Tube route daily.   Yes Historical Provider, MD  bethanechol (URECHOLINE) 10 MG tablet 10 mg by PEG Tube route every 8 (eight) hours.  Yes Historical Provider, MD  cloNIDine (CATAPRES) 0.1 MG tablet 0.1 mg by PEG Tube route 2 (two) times daily.   Yes Historical Provider, MD  enoxaparin (LOVENOX) 60 MG/0.6ML injection Inject 60 mg into the skin every 12 (twelve) hours.   Yes Historical Provider, MD  insulin aspart (NOVOLOG) 100 unit/mL injection Inject 0-5 Units into the skin 4 (four) times daily. Inject as per sliding scale: if  0-149 =0 units; 150-450 = 5 units, subcutaneously four times a  day. Notify MD if CBG is < or = to 60, and if CBG is > or = to 450    Yes Historical Provider, MD  insulin glargine (LANTUS) 100 UNIT/ML injection Inject 10 Units into the skin at bedtime.   Yes Historical Provider, MD  levETIRAcetam (KEPPRA) 100 MG/ML solution Place 5 mLs (500 mg total) into feeding tube 2 (two) times daily. 02/11/15  Yes Thurnell Lose, MD  metoprolol tartrate (LOPRESSOR) 25 mg/10 mL SUSP Place 50 mLs (125 mg total) into feeding tube 2 (two) times daily. 03/22/15  Yes Erick Colace, NP  Multiple Vitamins-Minerals (DECUBI-VITE) CAPS Take 1 capsule by mouth daily.   Yes Historical Provider, MD  Nutritional Supplements (NUTRITIONAL SUPPLEMENT PO) Place 30 mLs into feeding tube 2 (two) times daily. "protein liquid" to promote wound healing   Yes Historical Provider, MD    Family History No family history on file.  Social History Social History  Substance Use Topics  . Smoking status: Former Research scientist (life sciences)  . Smokeless tobacco: Never Used  . Alcohol use No     Allergies   Review of patient's allergies indicates no known allergies.   Review of Systems Review of Systems  Unable to perform ROS: Other    Physical Exam Updated Vital Signs BP 112/74 (BP Location: Right Arm)   Pulse 74   Temp 98.2 F (36.8 C) (Oral)   Resp 16   LMP  (LMP Unknown)   SpO2 100%   Physical Exam  Constitutional: She appears well-developed and well-nourished. No distress.  Nontoxic appearing  HENT:  Head: Normocephalic and atraumatic.  Eyes: Conjunctivae and EOM are normal. No scleral icterus.  Neck: Normal range of motion.  Cardiovascular: Normal rate.   Pulmonary/Chest: Effort normal. No respiratory distress.  Respirations even and unlabored  Abdominal: She exhibits no distension.  Soft abdomen. No signs of TTP. G-tube stoma appears appropriate; no surrounding erythema or heat to touch. No purulent drainage. No abdominal rigidity.  Musculoskeletal: Normal range of motion.    Neurological: She is alert.  Skin: Skin is warm and dry. No rash noted. She is not diaphoretic. No erythema. No pallor.  Psychiatric: She has a normal mood and affect. Her behavior is normal.  Nursing note and vitals reviewed.    ED Treatments / Results  DIAGNOSTIC STUDIES: Oxygen Saturation is 99% on RA, normal by my interpretation.    COORDINATION OF CARE: 8:24 PM Discussed treatment plan with pt at bedside which includes replacement of the feeding tube and pt agreed to plan.  Labs (all labs ordered are listed, but only abnormal results are displayed) Labs Reviewed - No data to display  EKG  EKG Interpretation None       Radiology Dg Abd 1 View  Result Date: 08/25/2015 CLINICAL DATA:  50 year old female with PEG tube malfunction. EXAM: ABDOMEN - 1 VIEW COMPARISON:  Radiograph dated 06/29/2015 FINDINGS: A percutaneous gastrostomy is noted in the left upper abdomen. Contrast injected through the tube opacifies the stomach. There is  extraluminal contrast over the left flank area likely contamination over the patient skin and a dressing. Multiple dilated air-filled loops of small bowel noted throughout the abdomen compatible with an ileus or small-bowel obstruction. No free air identified. No definite free intraperitoneal contrast seen. There is degenerative changes of the spine. No acute fracture a 7 mm cyst is radiodense focus over the left iliac bone corresponds to the density seen on the prior radiograph. IMPRESSION: Injected contrast through the PEG opacifies the stomach. No free intraperitoneal contrast or free air identified. Multiple air distended loops of small bowel compatible with obstruction versus ileus. Clinical correlation and follow-up recommended. Electronically Signed   By: Anner Crete M.D.   On: 08/25/2015 22:32   Procedures Gastrostomy tube replacement Date/Time: 08/25/2015 9:54 PM Performed by: Antonietta Breach Authorized by: Antonietta Breach  Consent: The  procedure was performed in an emergent situation. Risks and benefits: risks, benefits and alternatives were discussed Procedure consent: procedure consent matches procedure scheduled Relevant documents: relevant documents present and verified Test results: test results available and properly labeled Site marked: the operative site was marked Imaging studies: imaging studies available Required items: required blood products, implants, devices, and special equipment available Patient identity confirmed: arm band Time out: Immediately prior to procedure a "time out" was called to verify the correct patient, procedure, equipment, support staff and site/side marked as required. Preparation: Patient was prepped and draped in the usual sterile fashion. Local anesthesia used: no  Anesthesia: Local anesthesia used: no  Sedation: Patient sedated: no Comments: Attempted to pass 16Fr G-tube which met resistance. Subsequently able to pass 14Fr foley without resistance. Patient tolerated well without immediate complications.    (including critical care time)  Medications Ordered in ED Medications  diatrizoate meglumine-sodium (GASTROGRAFIN) 66-10 % solution 30 mL (30 mLs Per J Tube Given 08/25/15 2206)    Initial Impression / Assessment and Plan / ED Course  I have reviewed the triage vital signs and the nursing notes.  Pertinent labs & imaging results that were available during my care of the patient were reviewed by me and considered in my medical decision making (see chart for details).  Clinical Course    50 year old female presents to the emergency department from her nursing facility after pulling out her G-tube prior to arrival. Facility did not send the patient's G-tube nor were they aware of what size G-tube the patient had prior to arrival. Attempted to pass a 16 [place] G-tube, but was met with increased resistance. Able to pass a 14 [place] Foley without difficulty or resistance.  Patient tolerated well.  Placement confirmed by x-ray with Gastrografin in the stomach. There is evidence of ileus versus small bowel obstruction on x-ray. Upon review of prior imaging, this is consistent with prior evaluations. Patient has also had a CT scan in the past 3 months for concern or obstruction based on x-ray findings. CT was negative for mechanical obstruction during this encounter. Facility did not report any emesis and patient has had no vomiting in the ED. Her abdomen is soft and nontender.  Discussed need for outpatient IR gastrostomy tube replacement with Dr. Pascal Lux of radiology. He has sent a message to the PA service who will contact the patient's facility to coordinate formal G-tube placement by IR. No indication for further emergent workup at this time. Patient stable for discharge back to her facility  Final Clinical Impressions(s) / ED Diagnoses   Final diagnoses:  PEG tube malfunction [facility])    New Prescriptions New  Prescriptions   No medications on file    I personally performed the services described in this documentation, which was scribed in my presence. The recorded information has been reviewed and is accurate.       Antonietta Breach, PA-C 08/26/15 0023    Charlesetta Shanks, MD 09/01/15 [phone]

## 2015-08-25 NOTE — ED Notes (Signed)
RN and PA at bedside to place gtube.

## 2015-08-25 NOTE — Discharge Instructions (Signed)
We were unable to place a new gastrostomy tube in the emergency department. A Foley has been placed in the interim until you are able to have a new gastrostomy tube placed by IR. Interventional radiology should be contacting your facility to coordinate outpatient gastrostomy tube placement. Return to the emergency department as needed for worsening symptoms.

## 2015-08-25 NOTE — ED Notes (Signed)
X-ray at bedside

## 2015-08-25 NOTE — ED Notes (Signed)
PTAR Called 

## 2015-08-25 NOTE — ED Triage Notes (Signed)
Patient here with complaints of feeding tube dislodgement. [facility] staff reports patient pulled out feeding tube 1 hour ago. Patient has pediatric tube.

## 2015-08-25 NOTE — ED Notes (Signed)
Bed: WA03 Expected date:  Expected time:  Means of arrival:  Comments: EMS from [facility]-pulled out feeding tube

## 2015-08-26 ENCOUNTER — Encounter (HOSPITAL_COMMUNITY): Payer: Self-pay | Admitting: Interventional Radiology

## 2015-08-26 ENCOUNTER — Ambulatory Visit (HOSPITAL_COMMUNITY)
Admission: RE | Admit: 2015-08-26 | Discharge: 2015-08-26 | Disposition: A | Discharge status: Home / Self Care | Payer: Medicaid Other | Source: Ambulatory Visit | Attending: Emergency Medicine | Admitting: Emergency Medicine

## 2015-08-26 ENCOUNTER — Other Ambulatory Visit (HOSPITAL_COMMUNITY): Payer: Self-pay | Admitting: Emergency Medicine

## 2015-08-26 DIAGNOSIS — R633 Feeding difficulties, unspecified: Secondary | ICD-10-CM

## 2015-08-26 HISTORY — PX: IR GENERIC HISTORICAL: IMG1180011

## 2015-08-26 MED ORDER — LIDOCAINE VISCOUS 2 % MT SOLN
OROMUCOSAL | Status: AC
  Filled 2015-08-26: qty 15

## 2015-08-26 MED ORDER — LIDOCAINE VISCOUS 2 % MT SOLN
OROMUCOSAL | Status: AC | PRN
  Administered 2015-08-26: 3 mL via OROMUCOSAL

## 2015-08-27 ENCOUNTER — Encounter: Payer: Self-pay | Admitting: Internal Medicine

## 2015-08-27 DIAGNOSIS — A498 Other bacterial infections of unspecified site: Secondary | ICD-10-CM | POA: Insufficient documentation

## 2015-08-27 DIAGNOSIS — Z1612 Extended spectrum beta lactamase (ESBL) resistance: Principal | ICD-10-CM

## 2015-08-27 NOTE — Assessment & Plan Note (Addendum)
Will tx with Ertpenam1 gm IM/IV/PICC for 10 days. It is late in the day so whatever access they can get is fine. If not an IV then will go with IM until a PICC can be placed.

## 2015-08-27 NOTE — Progress Notes (Signed)
This encounter was created in error - please disregard.

## 2015-08-30 ENCOUNTER — Encounter: Payer: Self-pay | Admitting: Adult Health

## 2015-08-30 ENCOUNTER — Non-Acute Institutional Stay (SKILLED_NURSING_FACILITY): Payer: Medicaid Other | Admitting: Adult Health

## 2015-08-30 DIAGNOSIS — R339 Retention of urine, unspecified: Secondary | ICD-10-CM

## 2015-08-30 DIAGNOSIS — I10 Essential (primary) hypertension: Secondary | ICD-10-CM | POA: Diagnosis not present

## 2015-08-30 DIAGNOSIS — I69351 Hemiplegia and hemiparesis following cerebral infarction affecting right dominant side: Secondary | ICD-10-CM | POA: Diagnosis not present

## 2015-08-30 DIAGNOSIS — I825Y2 Chronic embolism and thrombosis of unspecified deep veins of left proximal lower extremity: Secondary | ICD-10-CM | POA: Diagnosis not present

## 2015-08-30 DIAGNOSIS — E1149 Type 2 diabetes mellitus with other diabetic neurological complication: Secondary | ICD-10-CM | POA: Diagnosis not present

## 2015-08-30 DIAGNOSIS — I2692 Saddle embolus of pulmonary artery without acute cor pulmonale: Secondary | ICD-10-CM

## 2015-08-30 DIAGNOSIS — I609 Nontraumatic subarachnoid hemorrhage, unspecified: Secondary | ICD-10-CM

## 2015-08-30 DIAGNOSIS — R1310 Dysphagia, unspecified: Secondary | ICD-10-CM | POA: Diagnosis not present

## 2015-08-30 DIAGNOSIS — Z931 Gastrostomy status: Secondary | ICD-10-CM | POA: Diagnosis not present

## 2015-08-30 NOTE — Progress Notes (Signed)
Patient ID: Rachel Vang, female   DOB: 08-13-1965, 50 y.o.   MRN: [id number]   Location:   [facility] Room Number: 111-B Place of Service:  SNF (31)   CODE STATUS: Full Code  No Known Allergies  Chief Complaint  Patient presents with  . Medical Management of Chronic Issues    Follow up    HPI:  She is a long term resident of this facility being seen for the management of her chronic illnesses. Overall her status is stable. Her weight is 118 pounds. She is unable to fully participate in the hpi or ros. There are no nursing concerns at this time.   Past Medical History:  Diagnosis Date  . Acute respiratory failure (HCC)   . Diabetes mellitus without complication (HCC)    Type 2, W/o complications  . Dysphagia   . Epilepsy (HCC)   . GERD (gastroesophageal reflux disease)   . Hyperlipidemia   . Hypertension   . IBS (irritable bowel syndrome)   . Nontraumatic subarachnoid hemorrhage (HCC)   . Urinary retention     Past Surgical History:  Procedure Laterality Date  . ABDOMINAL SURGERY    . ANEURYSM COILING    . COLONOSCOPY N/A 02/20/2015   Procedure: COLONOSCOPY;  Surgeon: Iva Boop, MD;  Location: [facility] ENDOSCOPY;  Service: Endoscopy;  Laterality: N/A;  . ESOPHAGOGASTRODUODENOSCOPY (EGD) WITH PROPOFOL N/A 02/02/2015   Procedure: ESOPHAGOGASTRODUODENOSCOPY (EGD) WITH PROPOFOL;  Surgeon: Jimmye Norman, MD;  Location: [facility] ENDOSCOPY;  Service: General;  Laterality: N/A;  . IR GENERIC HISTORICAL  08/26/2015   IR GASTRIC TUBE PERC CHG W/O IMG GUIDE 08/26/2015 Irish Lack, MD [facility]-INTERV RAD  . PEG PLACEMENT N/A 02/02/2015   Procedure: PERCUTANEOUS ENDOSCOPIC GASTROSTOMY (PEG) PLACEMENT;  Surgeon: Jimmye Norman, MD;  Location: [facility] ENDOSCOPY;  Service: General;  Laterality: N/A;  . RADIOLOGY WITH ANESTHESIA N/A 01/13/2015   Procedure: RADIOLOGY WITH ANESTHESIA;  Surgeon: Lisbeth Renshaw, MD;  Location: [facility] OR;  Service: Radiology;  Laterality: N/A;  . TRACHEOSTOMY       Social History   Social History  . Marital status: Single    Spouse name: N/A  . Number of children: N/A  . Years of education: N/A   Occupational History  . Not on file.   Social History Main Topics  . Smoking status: Former Games developer  . Smokeless tobacco: Never Used  . Alcohol use No  . Drug use: Unknown  . Sexual activity: Not on file   Other Topics Concern  . Not on file   Social History Narrative  . No narrative on file   History reviewed. No pertinent family history.    VITAL SIGNS BP 127/60   Pulse 69   Temp 97.4 F (36.3 C) (Oral)   Resp 18   Ht 5\' 3"  (1.6 m)   Wt 118 lb (53.5 kg)   LMP  (LMP Unknown)   SpO2 97%   BMI 20.90 kg/m   Patient's Medications  New Prescriptions   No medications on file  Previous Medications   ACETAMINOPHEN (TYLENOL) 325 MG TABLET    Take 650 mg by mouth every 4 (four) hours as needed for mild pain or fever.   AMLODIPINE (NORVASC) 10 MG TABLET    10 mg by Feeding Tube route daily.   BETHANECHOL (URECHOLINE) 10 MG TABLET    10 mg by PEG Tube route every 8 (eight) hours.    CLONIDINE (CATAPRES) 0.1 MG TABLET    0.1 mg by PEG  Tube route 2 (two) times daily.   ENOXAPARIN (LOVENOX) 60 MG/0.6ML INJECTION    Inject 60 mg into the skin every 12 (twelve) hours.   INSULIN ASPART (NOVOLOG) 100 UNIT/ML INJECTION    Inject 5 Units into the skin 4 (four) times daily. Inject as per sliding scale: if  0-149 =0 units; 150-450 = 5 units, subcutaneously four times a day. Notify MD if CBG is < or = to 60, and if CBG is > or = to 450    INSULIN GLARGINE (LANTUS) 100 UNIT/ML INJECTION    Inject 10 Units into the skin at bedtime.   LEVETIRACETAM (KEPPRA) 100 MG/ML SOLUTION    Place 5 mLs (500 mg total) into feeding tube 2 (two) times daily.   METOPROLOL TARTRATE (LOPRESSOR) 25 MG/10 ML SUSP    Place 50 mLs (125 mg total) into feeding tube 2 (two) times daily.   MULTIPLE VITAMINS-MINERALS (DECUBI-VITE) CAPS    Take 1 capsule by mouth daily.    NUTRITIONAL SUPPLEMENTS (NUTRITIONAL SUPPLEMENT PO)    Place 30 mLs into feeding tube 2 (two) times daily. "protein liquid" to promote wound healing  Modified Medications   No medications on file  Discontinued Medications   No medications on file     SIGNIFICANT DIAGNOSTIC EXAMS  02-17-15: ct angio of chest: 1. Extensive pulmonary emboli involving distal main pulmonary arteries extending into lobar, segmental and subsegmental sized branches throughout the lungs bilaterally. At this time, there are no overt findings to suggest right heart strain. 2. Cardiomegaly with left ventricular concentric hypertrophy. 3. Dependent atelectasis throughout the lower lobes of the lungs bilaterally.  02-18-15: bilateral lower extremity doppler: Findings consistent with acute deep vein thrombosis involving the left common femoral vein, left proximal profunda femoris vein, and left proximal femoral vein. Incidental findings are consistent with: enlarged lymph node on the right. - No evidence of deep vein thrombosis involving the right lower extremity. - No evidence of Baker&'s cyst on the right or left.  02-19-15: pelvic ultrasound: 1. Uterus not identified, presumed hysterectomy. No mass or free fluid seen within the midline pelvis. 2. Neither ovary is seen, perhaps bilateral oophorectomies, perhaps obscured by the fairly prominent fluid-filled bowel loops in the pelvis. No mass or free fluid seen within either adnexal region.  03-15-15: chest x-ray: 1. Tracheostomy tube is newly absent. No pneumomediastinum or pneumothorax. 2. Stable mild enlargement of the cardiopericardial silhouette. 3. Linear subsegmental atelectasis in the left lower lobe.  03-16-15: chest x-ray: Lungs clear.  Heart prominent but stable   LABS REVIEWED:   02-08-15: hgb a1c 7.1 03-14-15: wbc 20.1; hgb 10.8; hct 34.2; mcv 90.2; plt 313; glucose 119; bun 13; creat 0.46; k+ 4.1; na++131; liver normal albumin 3.1 03-20-15: wbc 15.1; hgb 10.0;  hct 32.0; mcv 92.8; plt 312; stool for c-diff: +  03-21-15: glucose 161; bun 9; creat 0.46; k+ 3.7; na++139  04-18-15: INR 3.4  08-06-15: urine culture; e-coli: ESBL: imipenem hgb a1c 5.5; urine micro-albumin 144.1 08-13-15: wbc 6.7 hgb 10.6; hct 35.9; mcv 89.8; plt 330; glucose 121; bun 16.7; creat 0.67; k+ 4.0; na++ 143; liver normal albumin 3.5        Review of Systems  Unable to perform ROS: patient nonverbal     Physical Exam  Constitutional: No distress.  Eyes: Conjunctivae are normal.  Neck: Neck supple. No JVD present. No thyromegaly present.  Cardiovascular: Normal rate, regular rhythm and intact distal pulses.   Respiratory: Effort normal and breath sounds normal. No respiratory distress.  She has no wheezes.  GI: Soft. Bowel sounds are normal. She exhibits no distension. There is no tenderness.  Has peg tube  Musculoskeletal: She exhibits no edema.  Right hemiparesis   Lymphadenopathy:    She has no cervical adenopathy.  Neurological: She is alert.  Skin: Skin is warm and dry. She is not diaphoretic.  Psychiatric: She has a normal mood and affect.      ASSESSMENT/ PLAN:  1. Diabetes: hgb a1c is 5.5; will continue lantus 10 units daily and novolog 5 units every 6 hours for cbg >=150. \  2. Seizure: no reports of seizure activity present: will continue keppra 500 mg twice daily and will monitor   3. 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  3. Hypertension: will continue lopressor 125 mg twice daily and clonidine 0.1 mg twice daily norvasc 10 mg daily   4.  Dysphagia: her po intake is not adequate to maintain her body weight; she is dependent upon peg tube feedings.  5. SAH: is neurologically without change; has right hemiparesis; will monitor her status.    6. UTI/urine retention: will continue urecholine 10 mg every 8 hours  She has been treated for uti this month    7.  Weight loss: will continue her tube feeding per dietician recommendations. Will continue to monitor her status.     Synthia Innocent NP [facility]  Contact [phone] Monday through Friday 8am- 5pm  After hours call [phone]

## 2015-09-13 ENCOUNTER — Emergency Department (HOSPITAL_COMMUNITY): Payer: Medicaid Other

## 2015-09-13 ENCOUNTER — Encounter (HOSPITAL_COMMUNITY): Payer: Self-pay

## 2015-09-13 ENCOUNTER — Emergency Department (HOSPITAL_COMMUNITY)
Admission: EM | Admit: 2015-09-13 | Discharge: 2015-09-14 | Disposition: A | Discharge status: Home / Self Care | Payer: Medicaid Other | Attending: Emergency Medicine | Admitting: Emergency Medicine

## 2015-09-13 DIAGNOSIS — E119 Type 2 diabetes mellitus without complications: Secondary | ICD-10-CM | POA: Insufficient documentation

## 2015-09-13 DIAGNOSIS — Z7984 Long term (current) use of oral hypoglycemic drugs: Secondary | ICD-10-CM | POA: Diagnosis not present

## 2015-09-13 DIAGNOSIS — Z7901 Long term (current) use of anticoagulants: Secondary | ICD-10-CM | POA: Diagnosis not present

## 2015-09-13 DIAGNOSIS — Z87891 Personal history of nicotine dependence: Secondary | ICD-10-CM | POA: Diagnosis not present

## 2015-09-13 DIAGNOSIS — I10 Essential (primary) hypertension: Secondary | ICD-10-CM | POA: Insufficient documentation

## 2015-09-13 DIAGNOSIS — K9423 Gastrostomy malfunction: Secondary | ICD-10-CM | POA: Diagnosis not present

## 2015-09-13 DIAGNOSIS — Z79899 Other long term (current) drug therapy: Secondary | ICD-10-CM | POA: Diagnosis not present

## 2015-09-13 MED ORDER — DIATRIZOATE MEGLUMINE & SODIUM 66-10 % PO SOLN
30.0000 mL | Freq: Once | ORAL | Status: AC
  Administered 2015-09-13: 30 mL via ORAL

## 2015-09-13 NOTE — Discharge Instructions (Signed)
Interventional radiology will call you tomorrow to schedule a follow up appointment for G tube placement.

## 2015-09-13 NOTE — ED Notes (Signed)
[facility] called for patient transport 

## 2015-09-13 NOTE — ED Triage Notes (Signed)
Per EMS- [facility] Patient  Pulled g-tube out. Patient has a history of dementia.

## 2015-09-13 NOTE — ED Provider Notes (Signed)
[facility]-EMERGENCY DEPT Provider Note   CSN: [id number] Arrival date & time: 09/13/15  1858     History   Chief Complaint Chief Complaint  Patient presents with  . pulled g-tube out    HPI Rachel Vang is a 50 y.o. female.  HPI  LEVEL 5 CAVEAT--Dementia  Rachel Vang is a 50 y.o. female with PMH significant for Dysphagia, DM, IBS, notraumatic subarachnoid hemorrhage, HTN, HLD who presents with G-tube displacement.  She reports it came out about an hour ago.  She denies any fever, chills, N/V/D, or abdominal pain. Per chart review, patient seen frequently in ED for this.  Past Medical History:  Diagnosis Date  . Acute respiratory failure (HCC)   . Diabetes mellitus without complication (HCC)    Type 2, W/o complications  . Dysphagia   . Epilepsy (HCC)   . GERD (gastroesophageal reflux disease)   . Hyperlipidemia   . Hypertension   . IBS (irritable bowel syndrome)   . Nontraumatic subarachnoid hemorrhage (HCC)   . Urinary retention     Patient Active Problem List   Diagnosis Date Noted  . Infection due to ESBL-producing Escherichia coli 08/27/2015  . Chronic respiratory failure with hypoxia (HCC) 08/08/2015  . Type II diabetes mellitus with neurological manifestations (HCC) 05/20/2015  . Essential hypertension, benign 05/20/2015  . S/P percutaneous endoscopic gastrostomy (PEG) tube placement (HCC) 05/20/2015  . DVT (deep venous thrombosis) (HCC) 04/05/2015  . Protein-calorie malnutrition, severe (HCC) 03/25/2015  . UTI (lower urinary tract infection)   . Acute pulmonary embolism (HCC) 02/19/2015  . Lower GI bleeding 02/19/2015  . Dysphagia 02/18/2015  . Seizures (HCC) 02/18/2015  . Urinary retention 02/18/2015  . History of ETT   . Hydrocephalus   . Neurological abnormality   . Subarachnoid hemorrhage (HCC)   . SAH (subarachnoid hemorrhage) (HCC)     Past Surgical History:  Procedure Laterality Date  . ABDOMINAL SURGERY    . ANEURYSM COILING    .  COLONOSCOPY N/A 02/20/2015   Procedure: COLONOSCOPY;  Surgeon: Iva Booparl E Gessner, MD;  [facility];  Service: Endoscopy;  Laterality: N/A;  . ESOPHAGOGASTRODUODENOSCOPY (EGD) WITH PROPOFOL N/A 02/02/2015   Procedure: ESOPHAGOGASTRODUODENOSCOPY (EGD) WITH PROPOFOL;  Surgeon: Jimmye NormanJames Wyatt, MD;  [facility];  Service: General;  Laterality: N/A;  . IR GENERIC HISTORICAL  08/26/2015   IR GASTRIC TUBE PERC CHG W/O IMG GUIDE 08/26/2015 Irish LackGlenn Yamagata, MD [facility]-INTERV RAD  . PEG PLACEMENT N/A 02/02/2015   Procedure: PERCUTANEOUS ENDOSCOPIC GASTROSTOMY (PEG) PLACEMENT;  Surgeon: Jimmye NormanJames Wyatt, MD;  [facility];  Service: General;  Laterality: N/A;  . RADIOLOGY WITH ANESTHESIA N/A 01/13/2015   Procedure: RADIOLOGY WITH ANESTHESIA;  Surgeon: Lisbeth RenshawNeelesh Nundkumar, MD;  Location: [facility] OR;  Service: Radiology;  Laterality: N/A;  . TRACHEOSTOMY      OB History    No data available       Home Medications    Prior to Admission medications   Medication Sig Start Date End Date Taking? Authorizing Provider  acetaminophen (TYLENOL) 325 MG tablet Take 650 mg by mouth every 4 (four) hours as needed for mild pain or fever.   Yes Historical Provider, MD  amLODipine (NORVASC) 10 MG tablet 10 mg by Feeding Tube route daily.   Yes Historical Provider, MD  bethanechol (URECHOLINE) 10 MG tablet 10 mg by PEG Tube route every 8 (eight) hours.    Yes Historical Provider, MD  cloNIDine (CATAPRES) 0.1 MG tablet Take 0.1 mg by mouth 2 (two) times  daily.    Yes Historical Provider, MD  enoxaparin (LOVENOX) 60 MG/0.6ML injection Inject 60 mg into the skin every 12 (twelve) hours.   Yes Historical Provider, MD  insulin aspart (NOVOLOG) 100 unit/mL injection Inject 5 Units into the skin 4 (four) times daily. Inject as per sliding scale: if  0-149 =0 units; 150-450 = 5 units, subcutaneously four times a day. Notify MD if CBG is < or = to 60, and if CBG is > or = to 450    Yes Historical Provider, MD  insulin  glargine (LANTUS) 100 UNIT/ML injection Inject 10 Units into the skin at bedtime.   Yes Historical Provider, MD  levETIRAcetam (KEPPRA) 100 MG/ML solution Place 5 mLs (500 mg total) into feeding tube 2 (two) times daily. 02/11/15  Yes Leroy Sea, MD  metoprolol tartrate (LOPRESSOR) 25 mg/10 mL SUSP Place 50 mLs (125 mg total) into feeding tube 2 (two) times daily. 03/22/15  Yes Simonne Martinet, NP  Multiple Vitamins-Minerals (DECUBI-VITE) CAPS Take 1 capsule by mouth daily.   Yes Historical Provider, MD  Nutritional Supplements (NUTRITIONAL SUPPLEMENT PO) Place 30 mLs into feeding tube 3 (three) times daily. "protein liquid" to promote wound healing    Yes Historical Provider, MD  Skin Protectants, Misc. (CALAZIME SKIN PROTECTANT EX) Apply 1 application topically 2 (two) times daily.   Yes Historical Provider, MD    Family History No family history on file.  Social History Social History  Substance Use Topics  . Smoking status: Former Games developer  . Smokeless tobacco: Never Used  . Alcohol use No     Allergies   Review of patient's allergies indicates no known allergies.   Review of Systems Review of Systems All other systems negative unless otherwise stated in HPI   Physical Exam Updated Vital Signs BP 110/91 (BP Location: Left Arm)   Pulse 104   Temp 98 F (36.7 C) (Oral)   Resp 18   LMP  (LMP Unknown)   SpO2 100%   Physical Exam  Constitutional: She is oriented to person, place, and time. She appears well-developed and well-nourished.  Non-toxic appearance. She does not have a sickly appearance. She does not appear ill.  HENT:  Head: Normocephalic and atraumatic.  Mouth/Throat: Oropharynx is clear and moist.  Eyes: Conjunctivae are normal.  Neck: Normal range of motion. Neck supple.  Cardiovascular: Normal rate and regular rhythm.   Pulmonary/Chest: Effort normal and breath sounds normal. No accessory muscle usage or stridor. No respiratory distress. She has no  wheezes. She has no rhonchi. She has no rales.  Abdominal: Soft. Bowel sounds are normal. She exhibits no distension. There is no tenderness. There is no rebound and no guarding.  G tube stoma without surrounding erythema, drainage, or warmth.  Musculoskeletal: Normal range of motion.  Lymphadenopathy:    She has no cervical adenopathy.  Neurological: She is alert and oriented to person, place, and time.  Speech clear without dysarthria.  Skin: Skin is warm and dry.  Psychiatric: She has a normal mood and affect. Her behavior is normal.     ED Treatments / Results  Labs (all labs ordered are listed, but only abnormal results are displayed) Labs Reviewed - No data to display  EKG  EKG Interpretation None       Radiology Dg Abdomen 1 View  Result Date: 09/13/2015 CLINICAL DATA:  Check gastric tube placement EXAM: ABDOMEN - 1 VIEW COMPARISON:  08/25/2015 FINDINGS: Contrast injected through the gastric tube. Contrast fills  the stomach. The tube enters the distal body of the stomach. Improvement in colonic dilatation compatible with ileus. IMPRESSION: Gastric tube is within the stomach.  No extravasation of contrast. Electronically Signed   By: Marlan Palauharles  Clark M.D.   On: 09/13/2015 21:28    Procedures Procedures (including critical care time)  Gastrostomy tube replacement Performed by: Dr. Eudelia Bunchardama Consent: Verbal consent obtained. Risks and benefits: risks, benefits and alternatives were discussed Required items: required blood products, implants, devices, and special equipment available Patient identity confirmed: hospital-assigned identification number Time out: Immediately prior to procedure a "time out" was called to verify the correct patient, procedure, equipment, support staff and site/side marked as required. Preparation: Patient was prepped and draped in the usual sterile fashion. Patient tolerance: Patient tolerated the procedure well with no immediate  complications.  Comments: Attempted 14 Fr g-tube with resistance.  Able to pass 12 french Gastrostomy tube placed without difficulty   Medications Ordered in ED Medications  diatrizoate meglumine-sodium (GASTROGRAFIN) 66-10 % solution 30 mL (30 mLs Oral Given by Other 09/13/15 2110)  diatrizoate meglumine-sodium (GASTROGRAFIN) 66-10 % solution 30 mL (30 mLs Oral Given 09/13/15 2058)     Initial Impression / Assessment and Plan / ED Course  I have reviewed the triage vital signs and the nursing notes.  Pertinent labs & imaging results that were available during my care of the patient were reviewed by me and considered in my medical decision making (see chart for details).  Clinical Course   Patient presents needing G tube replaced.  Pulled out around 5 PM.  No fever, abdominal pain, N/V/D.  Well appearing G tube site. Able to pass 12 Fr foley, confirmed placement with abdominal plain films. Discussed case with Dr. Grace IsaacWatts, on call for IR, to establish outpatient follow up for G tube placement. Evaluation does not show pathology requiring ongoing emergent intervention or admission. Pt is hemodynamically stable and mentating appropriately. Discussed findings/results and plan with patient/guardian, who agrees with plan. All questions answered. Return precautions discussed and outpatient follow up given.     Final Clinical Impressions(s) / ED Diagnoses   Final diagnoses:  Malfunction of gastrostomy tube [facility](HCC)    New Prescriptions New Prescriptions   No medications on file     Cheri FowlerKayla Kairo Laubacher, Cordelia Poche-C 09/13/15 2318    Nira ConnPedro Eduardo Cardama, MD 09/14/15 [phone]

## 2015-09-14 ENCOUNTER — Other Ambulatory Visit (HOSPITAL_COMMUNITY): Payer: Self-pay | Admitting: Interventional Radiology

## 2015-09-14 ENCOUNTER — Encounter (HOSPITAL_COMMUNITY): Payer: Self-pay | Admitting: Radiology

## 2015-09-14 ENCOUNTER — Ambulatory Visit (HOSPITAL_COMMUNITY)
Admission: RE | Admit: 2015-09-14 | Discharge: 2015-09-14 | Disposition: A | Discharge status: Home / Self Care | Payer: Medicaid Other | Source: Ambulatory Visit | Attending: Interventional Radiology | Admitting: Interventional Radiology

## 2015-09-14 DIAGNOSIS — I69391 Dysphagia following cerebral infarction: Secondary | ICD-10-CM | POA: Insufficient documentation

## 2015-09-14 DIAGNOSIS — R633 Feeding difficulties, unspecified: Secondary | ICD-10-CM

## 2015-09-14 DIAGNOSIS — R1310 Dysphagia, unspecified: Secondary | ICD-10-CM | POA: Insufficient documentation

## 2015-09-14 DIAGNOSIS — Z431 Encounter for attention to gastrostomy: Secondary | ICD-10-CM | POA: Diagnosis present

## 2015-09-14 HISTORY — PX: IR GENERIC HISTORICAL: IMG1180011

## 2015-09-14 NOTE — ED Notes (Signed)
Patient was alert, oriented and stable upon discharge. RN went over AVS and patient had no further questions.  

## 2015-09-14 NOTE — Procedures (Signed)
16 French balloon retention Foley catheter currently in place of previously placed gastrostomy tube which was dislodged 8/15. A new 16 F  Entuit balloon retention gastrostomy tube was placed today without immediate complications. Gauze dressing applied to site.

## 2015-09-25 DIAGNOSIS — I69351 Hemiplegia and hemiparesis following cerebral infarction affecting right dominant side: Secondary | ICD-10-CM | POA: Insufficient documentation

## 2015-09-30 ENCOUNTER — Encounter (HOSPITAL_COMMUNITY): Payer: Self-pay | Admitting: *Deleted

## 2015-09-30 ENCOUNTER — Emergency Department (HOSPITAL_COMMUNITY): Payer: Medicaid Other

## 2015-09-30 ENCOUNTER — Emergency Department (HOSPITAL_COMMUNITY)
Admission: EM | Admit: 2015-09-30 | Discharge: 2015-10-01 | Disposition: A | Discharge status: Home / Self Care | Payer: Medicaid Other | Attending: Emergency Medicine | Admitting: Emergency Medicine

## 2015-09-30 DIAGNOSIS — T85528A Displacement of other gastrointestinal prosthetic devices, implants and grafts, initial encounter: Secondary | ICD-10-CM

## 2015-09-30 DIAGNOSIS — E119 Type 2 diabetes mellitus without complications: Secondary | ICD-10-CM | POA: Insufficient documentation

## 2015-09-30 DIAGNOSIS — Z79899 Other long term (current) drug therapy: Secondary | ICD-10-CM | POA: Diagnosis not present

## 2015-09-30 DIAGNOSIS — I10 Essential (primary) hypertension: Secondary | ICD-10-CM | POA: Insufficient documentation

## 2015-09-30 DIAGNOSIS — K9423 Gastrostomy malfunction: Secondary | ICD-10-CM | POA: Diagnosis present

## 2015-09-30 DIAGNOSIS — Z431 Encounter for attention to gastrostomy: Secondary | ICD-10-CM

## 2015-09-30 DIAGNOSIS — Z7984 Long term (current) use of oral hypoglycemic drugs: Secondary | ICD-10-CM | POA: Diagnosis not present

## 2015-09-30 DIAGNOSIS — Z87891 Personal history of nicotine dependence: Secondary | ICD-10-CM | POA: Insufficient documentation

## 2015-09-30 DIAGNOSIS — Z09 Encounter for follow-up examination after completed treatment for conditions other than malignant neoplasm: Secondary | ICD-10-CM

## 2015-09-30 MED ORDER — IOPAMIDOL (ISOVUE-300) INJECTION 61%
30.0000 mL | Freq: Once | INTRAVENOUS | Status: AC | PRN
  Administered 2015-09-30: 30 mL

## 2015-09-30 NOTE — ED Notes (Signed)
Bed: [facility] Expected date:  Expected time:  Means of arrival:  Comments: 

## 2015-09-30 NOTE — ED Provider Notes (Signed)
[facility] DEPT Provider Note   CSN: [id number] Arrival date & time: 09/30/15  1751     History   Chief Complaint Chief Complaint  Patient presents Rachel  . Other    GI tube pulled out    HPI PAMLA PANGLE is a 50 y.o. Vang.  HPI   MINOLA GUIN is a 50 y.o. Vang, Rachel a history of DM  presenting to the ED Rachel G-tube dislodgment. Patient is from [facility], a skilled nursing facility, and the G-tube dislodgment was first noticed around 1700 today. Patient denies any pain. States that her G-tube was pulled out accidentally but does not remember how or exactly when. Denies abdominal pain, vomiting, fever/chills, or any other complaints.     Past Medical History:  Diagnosis Date  . Acute respiratory failure (HCC)   . Diabetes mellitus without complication (HCC)    Type 2, W/o complications  . Dysphagia   . Epilepsy (HCC)   . GERD (gastroesophageal reflux disease)   . Hyperlipidemia   . Hypertension   . IBS (irritable bowel syndrome)   . Nontraumatic subarachnoid hemorrhage (HCC)   . Urinary retention     Patient Active Problem List   Diagnosis Date Noted  . Hemiparesis affecting right side as late effect of stroke (HCC) 09/25/2015  . Infection due to ESBL-producing Escherichia coli 08/27/2015  . Chronic respiratory failure Rachel hypoxia (HCC) 08/08/2015  . Type II diabetes mellitus Rachel neurological manifestations (HCC) 05/20/2015  . Essential hypertension, benign 05/20/2015  . S/P percutaneous endoscopic gastrostomy (PEG) tube placement (HCC) 05/20/2015  . DVT (deep venous thrombosis) (HCC) 04/05/2015  . Protein-calorie malnutrition, severe (HCC) 03/25/2015  . UTI (lower urinary tract infection)   . Acute pulmonary embolism (HCC) 02/19/2015  . Lower GI bleeding 02/19/2015  . Dysphagia 02/18/2015  . Seizures (HCC) 02/18/2015  . Urinary retention 02/18/2015  . History of ETT   . Hydrocephalus   . Neurological abnormality   . Subarachnoid  hemorrhage (HCC)   . SAH (subarachnoid hemorrhage) (HCC)     Past Surgical History:  Procedure Laterality Date  . ABDOMINAL SURGERY    . ANEURYSM COILING    . COLONOSCOPY N/A 02/20/2015   Procedure: COLONOSCOPY;  Surgeon: Iva Boop, MD;  Location: [facility] ENDOSCOPY;  Service: Endoscopy;  Laterality: N/A;  . ESOPHAGOGASTRODUODENOSCOPY (EGD) Rachel PROPOFOL N/A 02/02/2015   Procedure: ESOPHAGOGASTRODUODENOSCOPY (EGD) Rachel PROPOFOL;  Surgeon: Jimmye Norman, MD;  Location: [facility] ENDOSCOPY;  Service: General;  Laterality: N/A;  . IR GENERIC HISTORICAL  08/26/2015   IR GASTRIC TUBE PERC CHG W/O IMG GUIDE 08/26/2015 Irish Lack, MD [facility]INTERV RAD  . IR GENERIC HISTORICAL  09/14/2015   IR REPLC GASTRO/COLONIC TUBE PERCUT W/FLUORO 09/14/2015 Darrell K Allred, PA-C [facility]INTERV RAD  . PEG PLACEMENT N/A 02/02/2015   Procedure: PERCUTANEOUS ENDOSCOPIC GASTROSTOMY (PEG) PLACEMENT;  Surgeon: Jimmye Norman, MD;  Location: [facility] ENDOSCOPY;  Service: General;  Laterality: N/A;  . RADIOLOGY Rachel ANESTHESIA N/A 01/13/2015   Procedure: RADIOLOGY Rachel ANESTHESIA;  Surgeon: Lisbeth Renshaw, MD;  Location: [facility] OR;  Service: Radiology;  Laterality: N/A;  . TRACHEOSTOMY      OB History    No data available       Home Medications    Prior to Admission medications   Medication Sig Start Date End Date Taking? Authorizing Provider  cloNIDine (CATAPRES) 0.1 MG tablet Take 0.1 mg by mouth 2 (two) times daily.    Yes Historical Provider, MD  acetaminophen (TYLENOL) 325 MG tablet Take 650  mg by mouth every 4 (four) hours as needed for mild pain or fever.    Historical Provider, MD  amLODipine (NORVASC) 10 MG tablet 10 mg by Feeding Tube route daily.    Historical Provider, MD  bethanechol (URECHOLINE) 10 MG tablet 10 mg by PEG Tube route every 8 (eight) hours.     Historical Provider, MD  enoxaparin (LOVENOX) 60 MG/0.6ML injection Inject 60 mg into the skin every 12 (twelve) hours.    Historical Provider, MD  insulin aspart  (NOVOLOG) 100 unit/mL injection Inject 5 Units into the skin 4 (four) times daily. Inject as per sliding scale: if  0-149 =0 units; 150-450 = 5 units, subcutaneously four times a day. Notify MD if CBG is < or = to 60, and if CBG is > or = to 450     Historical Provider, MD  insulin glargine (LANTUS) 100 UNIT/ML injection Inject 10 Units into the skin at bedtime.    Historical Provider, MD  levETIRAcetam (KEPPRA) 100 MG/ML solution Place 5 mLs (500 mg total) into feeding tube 2 (two) times daily. 02/11/15   Leroy SeaPrashant K Singh, MD  metoprolol tartrate (LOPRESSOR) 25 mg/10 mL SUSP Place 50 mLs (125 mg total) into feeding tube 2 (two) times daily. 03/22/15   Simonne MartinetPeter E Babcock, NP  Multiple Vitamins-Minerals (DECUBI-VITE) CAPS Take 1 capsule by mouth daily.    Historical Provider, MD  Nutritional Supplements (NUTRITIONAL SUPPLEMENT PO) Place 30 mLs into feeding tube 3 (three) times daily. "protein liquid" to promote wound healing     Historical Provider, MD  Skin Protectants, Misc. (CALAZIME SKIN PROTECTANT EX) Apply 1 application topically 2 (two) times daily.    Historical Provider, MD    Family History No family history on file.  Social History Social History  Substance Use Topics  . Smoking status: Former Games developermoker  . Smokeless tobacco: Never Used  . Alcohol use No     Allergies   Review of patient's allergies indicates no known allergies.   Review of Systems Review of Systems  Gastrointestinal: Negative for abdominal pain, nausea and vomiting.       G-tube dislodgment  All other systems reviewed and are negative.    Physical Exam Updated Vital Signs BP 101/67 (BP Location: Right Arm)   Pulse 90   Temp 99.4 F (37.4 C) (Oral)   Resp 17   LMP  (LMP Unknown)   SpO2 100%   Physical Exam  Constitutional: She appears well-developed and well-nourished. No distress.  HENT:  Head: Normocephalic and atraumatic.  Eyes: Conjunctivae are normal.  Neck: Neck supple.  Cardiovascular: Normal  rate, regular rhythm, normal heart sounds and intact distal pulses.   Pulmonary/Chest: Effort normal and breath sounds normal. No respiratory distress.  Abdominal: Soft. There is no tenderness. There is no guarding.  G-tube stoma intact Rachel no active hemorrhage or exudate. No surrounding erythema or swelling.  Musculoskeletal: She exhibits no edema or tenderness.  Lymphadenopathy:    She has no cervical adenopathy.  Neurological: She is alert.  Skin: Skin is warm and dry. She is not diaphoretic.  Psychiatric: She has a normal mood and affect. Her behavior is normal.  Nursing note and vitals reviewed.    ED Treatments / Results  Labs (all labs ordered are listed, but only abnormal results are displayed) Labs Reviewed - No data to display  EKG  EKG Interpretation None       Radiology Dg Abd 1 View  Result Date: 09/30/2015 CLINICAL DATA:  Status  post G-tube placement EXAM: ABDOMEN - 1 VIEW COMPARISON:  09/13/2015 FINDINGS: Contrast opacifies the stomach, confirming intraluminal placement. IMPRESSION: Gastrostomy tube in the stomach. Electronically Signed   By: Charline Bills M.D.   On: 09/30/2015 21:52    Procedures FEEDING TUBE REPLACEMENT Date/Time: 09/30/2015 6:35 PM Performed by: Anselm Pancoast Authorized by: Harolyn Rutherford C  Consent: Verbal consent obtained. Risks and benefits: risks, benefits and alternatives were discussed Consent given by: patient Patient identity confirmed: verbally Rachel patient and arm band Indications: tube dislodged and tube removed by patient Local anesthesia used: no  Anesthesia: Local anesthesia used: no  Sedation: Patient sedated: no Tube type: gastrostomy Patient position: supine Procedure type: replacement Tube size: 16 Fr Bulb inflation volume: 5 (ml) Bulb inflation fluid: normal saline Placement/position confirmation: x-ray Tube placement difficulty: moderate Patient tolerance: Patient tolerated the procedure well Rachel no  immediate complications Comments: This procedure was performed by Dr. Ranae Palms Rachel my assistance.  New: [place] 16 fr bolus Gtube Old: [employer] 16 fr 5-7 mL     (including critical care time)  Medications Ordered in ED Medications  iopamidol (ISOVUE-300) 61 % injection 30 mL (30 mLs Other Contrast Given 09/30/15 2133)     Initial Impression / Assessment and Plan / ED Course  I have reviewed the triage vital signs and the nursing notes.  Pertinent labs & imaging results that were available during my care of the patient were reviewed by me and considered in my medical decision making (see chart for details).  Clinical Course    ALLETTA MATTOS presents for dislodgment of her G-tube.   Gastrostomy tube replaced. Confirmed on xray.    Patient's gastrostomy (PEG) tube was placed by Jimmye Norman, MD on 02/02/2015.  Final Clinical Impressions(s) / ED Diagnoses   Final diagnoses:  Dislodged gastrostomy tube [facility])    New Prescriptions New Prescriptions   No medications on file     Concepcion Living 09/30/15 2204    Loren Racer, MD 10/02/15 1750

## 2015-09-30 NOTE — ED Triage Notes (Signed)
Per EMS - [facility] at [address] After removing her G-tube around 1700 today.  Patient has hx of same.  Patient has hx of dysphagia.  Patient's CBG was 126, hx of DM.  BP 97/66, HR 91, RR 12, 98% on RA.  Patient is alert and oriented to baseline per staff.

## 2015-09-30 NOTE — Discharge Instructions (Signed)
You have been seen today due to your gastrostomy tube becoming dislodged. This tube was replaced. The x-ray confirmed proper placement. Follow up with PCP as needed.

## 2015-09-30 NOTE — ED Notes (Signed)
Bed: WA02 Expected date:  Expected time:  Means of arrival:  Comments: G tube dislodged

## 2015-10-05 ENCOUNTER — Non-Acute Institutional Stay (SKILLED_NURSING_FACILITY): Payer: Medicaid Other | Admitting: Adult Health

## 2015-10-05 ENCOUNTER — Encounter: Payer: Self-pay | Admitting: Adult Health

## 2015-10-05 DIAGNOSIS — R1310 Dysphagia, unspecified: Secondary | ICD-10-CM | POA: Diagnosis not present

## 2015-10-05 DIAGNOSIS — Z931 Gastrostomy status: Secondary | ICD-10-CM | POA: Diagnosis not present

## 2015-10-05 DIAGNOSIS — I609 Nontraumatic subarachnoid hemorrhage, unspecified: Secondary | ICD-10-CM | POA: Diagnosis not present

## 2015-10-05 DIAGNOSIS — I2692 Saddle embolus of pulmonary artery without acute cor pulmonale: Secondary | ICD-10-CM

## 2015-10-05 DIAGNOSIS — R339 Retention of urine, unspecified: Secondary | ICD-10-CM

## 2015-10-05 DIAGNOSIS — I10 Essential (primary) hypertension: Secondary | ICD-10-CM | POA: Diagnosis not present

## 2015-10-05 DIAGNOSIS — R569 Unspecified convulsions: Secondary | ICD-10-CM | POA: Diagnosis not present

## 2015-10-05 DIAGNOSIS — I69351 Hemiplegia and hemiparesis following cerebral infarction affecting right dominant side: Secondary | ICD-10-CM | POA: Diagnosis not present

## 2015-10-05 DIAGNOSIS — I825Y2 Chronic embolism and thrombosis of unspecified deep veins of left proximal lower extremity: Secondary | ICD-10-CM

## 2015-10-05 DIAGNOSIS — E1149 Type 2 diabetes mellitus with other diabetic neurological complication: Secondary | ICD-10-CM | POA: Diagnosis not present

## 2015-10-05 NOTE — Progress Notes (Signed)
Patient ID: Rachel Vang, female   DOB: October 25, 1965, 50 y.o.   MRN: [id number]    Location:   [facility] Room Number: 111-B Place of Service:  SNF (31)   CODE STATUS: Full Code  No Known Allergies  Chief Complaint  Patient presents with  . Medical Management of Chronic Issues    Follow up    HPI:  She is a long term resident of this facility being seen for the management of her chronic illnesses. Overall there is little change in her status. She does continue to require her tube feeding at night to provide nutritional support. She is unable to participate in the hpi or ros. There are no nursing concerns at this time.    Past Medical History:  Diagnosis Date  . Acute respiratory failure (HCC)   . Diabetes mellitus without complication (HCC)    Type 2, W/o complications  . Dysphagia   . Epilepsy (HCC)   . GERD (gastroesophageal reflux disease)   . Hyperlipidemia   . Hypertension   . IBS (irritable bowel syndrome)   . Nontraumatic subarachnoid hemorrhage (HCC)   . Urinary retention     Past Surgical History:  Procedure Laterality Date  . ABDOMINAL SURGERY    . ANEURYSM COILING    . COLONOSCOPY N/A 02/20/2015   Procedure: COLONOSCOPY;  Surgeon: Iva Boop, MD;  Location[facility] ENDOSCOPY;  Service: Endoscopy;  Laterality: N/A;  . ESOPHAGOGASTRODUODENOSCOPY (EGD) WITH PROPOFOL N/A 02/02/2015   Procedure: ESOPHAGOGASTRODUODENOSCOPY (EGD) WITH PROPOFOL;  Surgeon: Jimmye Norman, MD;  Location: [facility] ENDOSCOPY;  Service: General;  Laterality: N/A;  . IR GENERIC HISTORICAL  08/26/2015   IR GASTRIC TUBE PERC CHG W/O IMG GUIDE 08/26/2015 Irish Lack, MD [facility]INTER[facility]  . IR GENERIC HISTORICAL  09/14/2015   IR REPLC GASTRO/COLONIC TUBE PERCUT W/FLUORO 09/14/2015 Darrell K Allred, PA-C [facility]INTER[facility]  . PEG PLACEMENT N/A 02/02/2015   Procedure: PERCUTANEOUS ENDOSCOPIC GASTROSTOMY (PEG) PLACEMENT;  Surgeon: Jimmye Norman, MD;  Location: [facility] ENDOSCOPY;  Service: General;  Laterality:  N/A;  . RADIOLOGY WITH ANESTHESIA N/A 01/13/2015   Procedure: RADIOLOGY WITH ANESTHESIA;  Surgeon: Lisbeth Renshaw, MD;  Location: [facility] OR;  Service: Radiology;  Laterality: N/A;  . TRACHEOSTOMY      Social History   Social History  . Marital status: Single    Spouse name: N/A  . Number of children: N/A  . Years of education: N/A   Occupational History  . Not on file.   Social History Main Topics  . Smoking status: Former Games developer  . Smokeless tobacco: Never Used  . Alcohol use No  . Drug use: No  . Sexual activity: Not on file   Other Topics Concern  . Not on file   Social History Narrative  . No narrative on file   History reviewed. No pertinent family history.    VITAL SIGNS BP 125/86   Pulse (!) 110   Temp 98 F (36.7 C) (Oral)   Resp 18   Ht 5\' 3"  (1.6 m)   Wt 116 lb 1 oz (52.6 kg)   LMP  (LMP Unknown)   SpO2 97%   BMI 20.56 kg/m   Patient's Medications  New Prescriptions   No medications on file  Previous Medications   ACETAMINOPHEN (TYLENOL) 325 MG TABLET    Take 650 mg by mouth every 4 (four) hours as needed for mild pain or fever.   AMLODIPINE (NORVASC) 10 MG TABLET    10 mg by Feeding  Tube route daily.   BETHANECHOL (URECHOLINE) 10 MG TABLET    10 mg by PEG Tube route every 8 (eight) hours.    CLONIDINE (CATAPRES) 0.1 MG TABLET    Take 0.1 mg by mouth 2 (two) times daily.    ENOXAPARIN (LOVENOX) 60 MG/0.6ML INJECTION    Inject 60 mg into the skin every 12 (twelve) hours.   INSULIN ASPART (NOVOLOG) 100 UNIT/ML INJECTION    Inject 5 Units into the skin 4 (four) times daily. Inject as per sliding scale: if  0-149 =0 units; 150-450 = 5 units, subcutaneously four times a day. Notify MD if CBG is < or = to 60, and if CBG is > or = to 450    INSULIN GLARGINE (LANTUS) 100 UNIT/ML INJECTION    Inject 10 Units into the skin at bedtime.   LEVETIRACETAM (KEPPRA) 100 MG/ML SOLUTION    Place 5 mLs (500 mg total) into feeding tube 2 (two) times daily.   METOPROLOL  TARTRATE (LOPRESSOR) 25 MG/10 ML SUSP    Place 50 mLs (125 mg total) into feeding tube 2 (two) times daily.   MULTIPLE VITAMINS-MINERALS (DECUBI-VITE) CAPS    Take 1 capsule by mouth daily.   NUTRITIONAL SUPPLEMENTS (NUTRITIONAL SUPPLEMENT PO)    Place 60 mLs into feeding tube 3 (three) times daily. "protein liquid" to promote wound healing    SKIN PROTECTANTS, MISC. (CALAZIME SKIN PROTECTANT EX)    Apply 1 application topically 2 (two) times daily.  Modified Medications   No medications on file  Discontinued Medications   No medications on file     SIGNIFICANT DIAGNOSTIC EXAMS  02-17-15: ct angio of chest: 1. Extensive pulmonary emboli involving distal main pulmonary arteries extending into lobar, segmental and subsegmental sized branches throughout the lungs bilaterally. At this time, there are no overt findings to suggest right heart strain. 2. Cardiomegaly with left ventricular concentric hypertrophy. 3. Dependent atelectasis throughout the lower lobes of the lungs bilaterally.  02-18-15: bilateral lower extremity doppler: Findings consistent with acute deep vein thrombosis involving the left common femoral vein, left proximal profunda femoris vein, and left proximal femoral vein. Incidental findings are consistent with: enlarged lymph node on the right. - No evidence of deep vein thrombosis involving the right lower extremity. - No evidence of Baker&'s cyst on the right or left.  02-19-15: pelvic ultrasound: 1. Uterus not identified, presumed hysterectomy. No mass or free fluid seen within the midline pelvis. 2. Neither ovary is seen, perhaps bilateral oophorectomies, perhaps obscured by the fairly prominent fluid-filled bowel loops in the pelvis. No mass or free fluid seen within either adnexal region.  03-15-15: chest x-ray: 1. Tracheostomy tube is newly absent. No pneumomediastinum or pneumothorax. 2. Stable mild enlargement of the cardiopericardial silhouette. 3. Linear subsegmental  atelectasis in the left lower lobe.  03-16-15: chest x-ray: Lungs clear.  Heart prominent but stable   LABS REVIEWED:   02-08-15: hgb a1c 7.1 03-14-15: wbc 20.1; hgb 10.8; hct 34.2; mcv 90.2; plt 313; glucose 119; bun 13; creat 0.46; k+ 4.1; na++131; liver normal albumin 3.1 03-20-15: wbc 15.1; hgb 10.0; hct 32.0; mcv 92.8; plt 312; stool for c-diff: +  03-21-15: glucose 161; bun 9; creat 0.46; k+ 3.7; na++139  04-18-15: INR 3.4  08-06-15: urine culture; e-coli: ESBL: imipenem   hgb a1c 5.5; urine micro-albumin 144.1 08-13-15: wbc 6.7 hgb 10.6; hct 35.9; mcv 89.8; plt 330; glucose 121; bun 16.7; creat 0.67; k+ 4.0; na++ 143; liver normal albumin 3.5  Review of Systems  Unable to perform ROS: patient nonverbal     Physical Exam  Constitutional: No distress.  Eyes: Conjunctivae are normal.  Neck: Neck supple. No JVD present. No thyromegaly present.  Cardiovascular: Normal rate, regular rhythm and intact distal pulses.   Respiratory: Effort normal and breath sounds normal. No respiratory distress. She has no wheezes.  GI: Soft. Bowel sounds are normal. She exhibits no distension. There is no tenderness.  Has peg tube  Musculoskeletal: She exhibits no edema.  Right hemiparesis   Lymphadenopathy:    She has no cervical adenopathy.  Neurological: She is alert.  Skin: Skin is warm and dry. She is not diaphoretic.  Psychiatric: She has a normal mood and affect.      ASSESSMENT/ PLAN:  1. Diabetes: hgb a1c is 5.5; will continue lantus 10 units daily and novolog 5 units every 6 hours for cbg >=150.   2. Seizure: no reports of seizure activity present: will continue keppra 500 mg twice daily and will monitor   3. 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  3. Hypertension:  will continue lopressor 125 mg twice daily and clonidine 0.1 mg twice daily norvasc 10 mg daily   4.  Dysphagia: her po intake is not adequate to maintain her body weight; she is dependent upon peg tube feedings.  5. SAH: is neurologically without change; has right hemiparesis; will monitor her status.   6. UTI/urine retention: will continue urecholine 10 mg every 8 hours    7.  Weight loss: will continue her tube feeding per dietician recommendations. Will continue to monitor her status.    Will check lipids   Synthia Innocenteborah Green NP [facility]  Contact [phone] Monday through Friday 8am- 5pm  After hours call [phone]

## 2015-10-14 ENCOUNTER — Non-Acute Institutional Stay (SKILLED_NURSING_FACILITY): Payer: Medicaid Other | Admitting: Internal Medicine

## 2015-10-14 DIAGNOSIS — E1149 Type 2 diabetes mellitus with other diabetic neurological complication: Secondary | ICD-10-CM

## 2015-10-14 NOTE — Progress Notes (Signed)
Patient ID: Rachel Vang, female   DOB: 1965-09-04, 50 y.o.   MRN: [id number]    Location:   [facility] Room Number: 111-B Place of Service:  SNF (31) Provider:  Edmon Crape, PA-C  No PCP Per Patient  Patient Care Team: No Pcp Per Patient as PCP - General (General Practice)  Extended Emergency Contact Information Primary Emergency Contact: Littman,Carlton  [place] Mobile Phone: [phone] Relation: Son Secondary Emergency Contact: Debroah Ball[place] Mobile Phone: [phone] Relation: Son  Goals of care: Advanced Directive information Advanced Directives 10/14/2015  Does patient have an advance directive? No  Type of Advance Directive -  Does patient want to make changes to advanced directive? -  Copy of advanced directive(s) in chart? -  Would patient like information on creating an advanced directive? -     Chief Complaint  Patient presents with  . Acute Visit    Questionable blood sugar    HPI:  Pt is a 50 y.o. female seen today for an acute visit for A reported blood sugar 39-however on recheck it was 100 and 1I suspect this was more a glucometer variation possibly not enough blood sample for the original reading.  She is on Lantus 10 units daily at bedtime for have reviewed her blood sugars and they appear relatively well controlled largely in the 100s although I do see a 57 at 6:30 PM on September 13 I also see more distant ones were blood sugars are in the 70s. Note she is also on NovoLog insulin with meals if CBG is greater than 150 she gets 5 units  Currently she is in bed she is comfortable she is alert nursing staff has not really noted any changes here   Past Medical History:  Diagnosis Date  . Acute respiratory failure (HCC)   . Diabetes mellitus without complication (HCC)    Type 2, W/o complications  . Dysphagia   . Epilepsy (HCC)   . GERD (gastroesophageal reflux disease)   . Hyperlipidemia    . Hypertension   . IBS (irritable bowel syndrome)   . Nontraumatic subarachnoid hemorrhage (HCC)   . Urinary retention    Past Surgical History:  Procedure Laterality Date  . ABDOMINAL SURGERY    . ANEURYSM COILING    . COLONOSCOPY N/A 02/20/2015   Procedure: COLONOSCOPY;  Surgeon: Iva Boop, MD;  Location: [facility] ENDOSCOPY;  Service: Endoscopy;  Laterality: N/A;  . ESOPHAGOGASTRODUODENOSCOPY (EGD) WITH PROPOFOL N/A 02/02/2015   Procedure: ESOPHAGOGASTRODUODENOSCOPY (EGD) WITH PROPOFOL;  Surgeon: Jimmye Norman, MD;  Location: [facility] ENDOSCOPY;  Service: General;  Laterality: N/A;  . IR GENERIC HISTORICAL  08/26/2015   IR GASTRIC TUBE PERC CHG W/O IMG GUIDE 08/26/2015 Irish Lack, MD [facility]INTER[facility] RAD  . IR GENERIC HISTORICAL  09/14/2015   IR REPLC GASTRO/COLONIC TUBE PERCUT W/FLUORO 09/14/2015 Darrell K Allred, PA-C [facility]INTER[facility]  . PEG PLACEMENT N/A 02/02/2015   Procedure: PERCUTANEOUS ENDOSCOPIC GASTROSTOMY (PEG) PLACEMENT;  Surgeon: Jimmye Norman, MD;  Location: [facility] ENDOSCOPY;  Service: General;  Laterality: N/A;  . RADIOLOGY WITH ANESTHESIA N/A 01/13/2015   Procedure: RADIOLOGY WITH ANESTHESIA;  Surgeon: Lisbeth Renshaw, MD;  Location: [facility] OR;  Service: Radiology;  Laterality: N/A;  . TRACHEOSTOMY      No Known Allergies    Medication List       Accurate as of 10/14/15 12:56 PM. Always use your most recent med list.          acetaminophen 325 MG  tablet Commonly known as:  TYLENOL Take 650 mg by mouth every 4 (four) hours as needed for mild pain or fever.   amLODipine 10 MG tablet Commonly known as:  NORVASC 10 mg by Feeding Tube route daily.   bethanechol 10 MG tablet Commonly known as:  URECHOLINE 10 mg by PEG Tube route every 8 (eight) hours.   CALAZIME SKIN PROTECTANT EX Apply 1 application topically 2 (two) times daily.   cloNIDine 0.1 MG tablet Commonly known as:  CATAPRES Take 0.1 mg by mouth 2 (two) times daily.   DECUBI-VITE Caps Take 1 capsule by mouth daily.     insulin aspart 100 unit/mL injection Commonly known as:  novoLOG Inject 5 Units into the skin 4 (four) times daily. Inject as per sliding scale: if  0-149 =0 units; 150-450 = 5 units, subcutaneously four times a day. Notify MD if CBG is < or = to 60, and if CBG is > or = to 450   insulin glargine 100 UNIT/ML injection Commonly known as:  LANTUS Inject 10 Units into the skin at bedtime.   levETIRAcetam 100 MG/ML solution Commonly known as:  KEPPRA Place 5 mLs (500 mg total) into feeding tube 2 (two) times daily.   LOVENOX 60 MG/0.6ML injection Generic drug:  enoxaparin Inject 60 mg into the skin every 12 (twelve) hours.   metoprolol 100 MG tablet Commonly known as:  LOPRESSOR Place 100 mg into feeding tube 2 (two) times daily.   metoprolol tartrate 25 MG tablet Commonly known as:  LOPRESSOR Place 25 mg into feeding tube 2 (two) times daily.   NUTRITIONAL SUPPLEMENT PO Place 60 mLs into feeding tube 3 (three) times daily. "protein liquid" to promote wound healing       Review of Systems   Patient is largely nonverbal and difficult to obtain but she is at her baseline is bright alert responsive does not show any signs of hypoglycemia Immunization History  Administered Date(s) Administered  . Influenza-Unspecified 03/09/2015   Pertinent  Health Maintenance Due  Topic Date Due  . HEMOGLOBIN A1C  08/08/2015  . FOOT EXAM  01/30/2016 (Originally 05/04/1973)  . MAMMOGRAM  01/30/2016 (Originally 05/04/2013)  . PAP SMEAR  01/30/2016 (Originally 05/04/1984)  . OPHTHALMOLOGY EXAM  01/30/2016 (Originally 05/04/1973)  . URINE MICROALBUMIN  01/30/2016 (Originally 05/04/1973)  . INFLUENZA VACCINE  03/22/2016 (Originally 08/30/2015)  . COLONOSCOPY  02/19/2025   No flowsheet data found. Functional Status Survey:     She is afebrile pulse 96 respirations 18 Physical Exam   Constitutional: No distress. IV bed comfortably.  Her skin is warm and dry Eyes: Conjunctivae are normal.  Neck:  Neck supple. No JVD present. No thyromegaly present.  Cardiovascular: Normal rate, regular rhythm and intact distal pulses.   Respiratory: Effort normal and breath sounds normal. No respiratory distress. She has no wheezes.  GI: Soft. Bowel sounds are normal. She exhibits no distension. There is no tenderness.  Has peg tube  Musculoskeletal: She exhibits no edema.  Right hemiparesis   L ymphadenopathy:    She has no cervical adenopathy.  Neurological: She is alert.   Psychiatric: She has a normal mood and affect.     Labs reviewed:  Recent Labs  02/04/15 0542 02/05/15 0233 02/06/15 0442  02/10/15 0603  03/21/15 1550 03/22/15 0417 05/13/15 1931 08/13/15  NA 142 139 142  < > 146*  < > 139 138 139 143  K 3.7 4.1 3.8  < > 3.8  < > 3.7 4.1  3.4* 4.0  CL 111 106 115*  < > 112*  < > 103 103 100*  --   CO2 22 23 18*  < > 25  < > 27 25 28   --   GLUCOSE 143* 139* 182*  < > 182*  < > 161* 145* 113*  --   BUN 21* 17 18  < > 22*  < > 9 9 10 17   CREATININE 0.55 0.54 0.54  < > 0.54  < > 0.49 0.48 0.69 0.7  CALCIUM 9.2 9.3 9.3  < > 9.1  < > 9.1 9.5 9.7  --   MG 1.9 1.8 1.9  --  2.0  --   --   --   --   --   PHOS 3.5 3.7 2.6  --   --   --   --   --   --   --   < > = values in this interval not displayed.  Recent Labs  02/17/15 1518 03/14/15 1750 05/13/15 1931 08/13/15  AST 27 24 17 13   ALT 57* 45 19 10  ALKPHOS 144* 144* 91 95  BILITOT 0.1* 0.7 0.6  --   PROT 7.4 8.1 7.4  --   ALBUMIN 2.9* 3.1* 3.4*  --     Recent Labs  02/19/15 0130  03/14/15 1750  03/17/15 0306 03/20/15 0507 05/13/15 1931 08/13/15  WBC 12.2*  < > 20.1*  < > 19.7* 15.1* 8.0 6.7  NEUTROABS 8.9*  --  15.4*  --   --   --  4.5  --   HGB 8.0*  < > 10.8*  < > 10.9* 10.0* 12.6 10.6*  HCT 25.3*  < > 34.2*  < > 34.6* 32.0* 40.3 36  MCV 95.8  < > 90.2  < > 92.5 92.8 86.9  --   PLT 203  < > 313  < > 309 312 309 330  < > = values in this interval not displayed. No results found for: TSH Lab Results  Component  Value Date   HGBA1C 7.1 (H) 02/08/2015   Lab Results  Component Value Date   TRIG 137 01/15/2015    Significant Diagnostic Results in last 30 days:  Dg Abd 1 View  Result Date: 09/30/2015 CLINICAL DATA:  Status post G-tube placement EXAM: ABDOMEN - 1 VIEW COMPARISON:  09/13/2015 FINDINGS: Contrast opacifies the stomach, confirming intraluminal placement. IMPRESSION: Gastrostomy tube in the stomach. Electronically Signed   By: Charline Bills M.D.   On: 09/30/2015 21:52   Ir Replc Gastro/colonic Tube Percut W/fluoro  Result Date: 09/14/2015 INDICATION: Patient with history of prior CVA and dysphagia ; recent dislodgement of chronic indwelling balloon retention gastrostomy tube with temporary replacement of 16 French Foley catheter in the ED on 09/13/15; patient returns today for gastrostomy tube exchange. EXAM: GASTROSTOMY CATHETER REPLACEMENT MEDICATIONS: None ANESTHESIA/SEDATION: None CONTRAST:  None FLUOROSCOPY TIME:  None COMPLICATIONS: None immediate. PROCEDURE: The pre-existing Foley catheter was removed. A new 16 French balloon retention gastrostomy tube was advanced through the tract and into the stomach. The retention balloon was inflated with 6 mL of saline . IMPRESSION: Replacement of gastrostomy tube with advancement of new 16 French balloon retention catheter into the stomach. Gauze dressing applied to site. Read by: Jeananne Rama, PA-C Electronically Signed   By: Malachy Moan M.D.   On: 09/14/2015 15:45    Assessment/Plan  #1-history of diabetes type 2-blood sugars look fairly stable however I am concerned occasionally of lower readings  in the 61s and 70s will reduce her Lantus down to 5 units a day-continue her NovoLog with meals and monitor we may have to adjust her Lantus back up but would like to avoid any hypoglycemia.  [id number]

## 2015-11-01 ENCOUNTER — Non-Acute Institutional Stay (SKILLED_NURSING_FACILITY): Payer: Medicaid Other | Admitting: Internal Medicine

## 2015-11-01 ENCOUNTER — Encounter: Payer: Self-pay | Admitting: Internal Medicine

## 2015-11-01 DIAGNOSIS — I609 Nontraumatic subarachnoid hemorrhage, unspecified: Secondary | ICD-10-CM

## 2015-11-01 DIAGNOSIS — E1149 Type 2 diabetes mellitus with other diabetic neurological complication: Secondary | ICD-10-CM | POA: Diagnosis not present

## 2015-11-01 DIAGNOSIS — R569 Unspecified convulsions: Secondary | ICD-10-CM

## 2015-11-01 DIAGNOSIS — I69351 Hemiplegia and hemiparesis following cerebral infarction affecting right dominant side: Secondary | ICD-10-CM | POA: Diagnosis not present

## 2015-11-01 DIAGNOSIS — I10 Essential (primary) hypertension: Secondary | ICD-10-CM

## 2015-11-01 NOTE — Progress Notes (Signed)
This is a routine visit.  Level care skilled.  Facility is [facility] of chronic medical issues including diabetes type 2-seizure disorder-history DVT-hypertension-dysphagia-SAH-weight loss.  History of present illness.  Patient is a pleasant 50 year old female with the above diagnoses-she is a long-term resident of the facility-she still requires tube feedings at night provide nutritional support-she cannot really participate in her history of present illness review of systems-per discussion with nursing however she has been stable with no nursing concerns at this time.  She does have a history of type 2 diabetes is on Lantus 5 units a day we reduce this secondary to concerns about lower blood sugars at times nonetheless her blood sugars appear to be still somewhat low often in the 80s per recent readings-we'll discontinue the Lantus and monitor  Past Medical History:  Diagnosis Date  . Acute respiratory failure (HCC)   . Diabetes mellitus without complication (HCC)    Type 2, W/o complications  . Dysphagia   . Epilepsy (HCC)   . GERD (gastroesophageal reflux disease)   . Hyperlipidemia   . Hypertension   . IBS (irritable bowel syndrome)   . Nontraumatic subarachnoid hemorrhage (HCC)   . Urinary retention         Past Surgical History:  Procedure Laterality Date  . ABDOMINAL SURGERY    . ANEURYSM COILING    . COLONOSCOPY N/A 02/20/2015   Procedure: COLONOSCOPY;  Surgeon: Iva Boop, MD;  Location: [facility] ENDOSCOPY;  Service: Endoscopy;  Laterality: N/A;  . ESOPHAGOGASTRODUODENOSCOPY (EGD) WITH PROPOFOL N/A 02/02/2015   Procedure: ESOPHAGOGASTRODUODENOSCOPY (EGD) WITH PROPOFOL;  Surgeon: Jimmye Norman, MD;  Location: [facility] ENDOSCOPY;  Service: General;  Laterality: N/A;  . IR GENERIC HISTORICAL  08/26/2015   IR GASTRIC TUBE PERC CHG W/O IMG GUIDE 08/26/2015 Irish Lack, MD [facility]  . IR GENERIC HISTORICAL   09/14/2015   IR REPLC GASTRO/COLONIC TUBE PERCUT W/FLUORO 09/14/2015 Darrell K Allred, PA-C [facility]  . PEG PLACEMENT N/A 02/02/2015   Procedure: PERCUTANEOUS ENDOSCOPIC GASTROSTOMY (PEG) PLACEMENT;  Surgeon: Jimmye Norman, MD;  Location: [facility] ENDOSCOPY;  Service: General;  Laterality: N/A;  . RADIOLOGY WITH ANESTHESIA N/A 01/13/2015   Procedure: RADIOLOGY WITH ANESTHESIA;  Surgeon: Lisbeth Renshaw, MD;  Location: [facility] OR;  Service: Radiology;  Laterality: N/A;  . TRACHEOSTOMY      No Known Allergies        Medication List                    acetaminophen 325 MG tablet Commonly known as:  TYLENOL Take 650 mg by mouth every 4 (four) hours as needed for mild pain or fever.  amLODipine 10 MG tablet Commonly known as:  NORVASC 10 mg by Feeding Tube route daily.  bethanechol 10 MG tablet Commonly known as:  URECHOLINE 10 mg by PEG Tube route every 8 (eight) hours.  CALAZIME SKIN PROTECTANT EX Apply 1 application topically 2 (two) times daily.  cloNIDine 0.1 MG tablet Commonly known as:  CATAPRES Take 0.1 mg by mouth 2 (two) times daily.  DECUBI-VITE Caps Take 1 capsule by mouth daily.  insulin aspart 100 unit/mL injection Commonly known as:  novoLOG Inject 5 Units into the skin 4 (four) times daily. Inject as per sliding scale: if  0-149 =0 units; 150-450 = 5 units, subcutaneously four times a day. Notify MD if CBG is < or = to 60, and if CBG is > or = to 450   .  levETIRAcetam 100 MG/ML solution Commonly known as:  KEPPRA Place 5 mLs (500 mg total) into feeding tube 2 (two) times daily.  LOVENOX 60 MG/0.6ML injection Generic drug:  enoxaparin Inject 60 mg into the skin every 12 (twelve) hours.  metoprolol 125 MG tablet Commonly known as:  LOPRESSOR Place 125 mg into feeding tube 2 (two) times daily.  metoprolol tartrate 25 MG tablet Commonly known as:  LOPRESSOR Place 25 mg into feeding tube 2 (two) times daily.  NUTRITIONAL SUPPLEMENT PO Place 60 mLs  into feeding tube 3 (three) times daily. "protein liquid" to promote wound healing      Review of Systems   Patient is largely nonverbal and difficult to obtain but she is at her baseline is bright alert responsive -Nursing staff has not noted any recent concerns Immunization History  Administered Date(s) Administered  . Influenza-Unspecified 03/09/2015       Pertinent  Health Maintenance Due  Topic Date Due  . HEMOGLOBIN A1C  08/08/2015  . FOOT EXAM  01/30/2016 (Originally 05/04/1973)  . MAMMOGRAM  01/30/2016 (Originally 05/04/2013)  . PAP SMEAR  01/30/2016 (Originally 05/04/1984)  . OPHTHALMOLOGY EXAM  01/30/2016 (Originally 05/04/1973)  . URINE MICROALBUMIN  01/30/2016 (Originally 05/04/1973)  . INFLUENZA VACCINE  03/22/2016 (Originally 08/30/2015)  . COLONOSCOPY  02/19/2025   No flowsheet data found. Functional Status Survey:     Temperature is 98.1 pulse 76 respirations 18 blood pressure 121/72 her weight is 116.6 this appears to be a loss of approximately 10 pounds since early July Physical Exam   Constitutional: No distress. Sitting comfortably in her wheelchair.  Her skin is warm and dry Eyes: Conjunctivae are normal.   Cardiovascular: Normal rate, regular rhythm and intact distal pulses.  Respiratory: Effort normal and breath sounds normal. No respiratory distress. She has no wheezes.  GI: Soft. Bowel sounds are normal. She exhibits no distension. There is no tenderness.  Has peg tube appears unremarkable without any surrounding drainage or erythema  Musculoskeletal: She exhibits no edema.  Right hemiparesis  L ymphadenopathy:  She has no cervical adenopathy.  Neurological: She is alert.   Psychiatric: She has a normal mood and affect. Pleasant smiling follow simple verbal commands     Labs reviewed:  Recent Labs (within last 365 days)   Recent Labs  02/04/15 0542 02/05/15 0233 02/06/15 0442  02/10/15 0603  03/21/15 1550 03/22/15 0417  05/13/15 1931 08/13/15  NA 142 139 142  < > 146*  < > 139 138 139 143  K 3.7 4.1 3.8  < > 3.8  < > 3.7 4.1 3.4* 4.0  CL 111 106 115*  < > 112*  < > 103 103 100*  --   CO2 22 23 18*  < > 25  < > 27 25 28   --   GLUCOSE 143* 139* 182*  < > 182*  < > 161* 145* 113*  --   BUN 21* 17 18  < > 22*  < > 9 9 10 17   CREATININE 0.55 0.54 0.54  < > 0.54  < > 0.49 0.48 0.69 0.7  CALCIUM 9.2 9.3 9.3  < > 9.1  < > 9.1 9.5 9.7  --   MG 1.9 1.8 1.9  --  2.0  --   --   --   --   --   PHOS 3.5 3.7 2.6  --   --   --   --   --   --   --   < > =  values in this interval not displayed.    Recent Labs (within last 365 days)   Recent Labs  02/17/15 1518 03/14/15 1750 05/13/15 1931 08/13/15  AST 27 24 17 13   ALT 57* 45 19 10  ALKPHOS 144* 144* 91 95  BILITOT 0.1* 0.7 0.6  --   PROT 7.4 8.1 7.4  --   ALBUMIN 2.9* 3.1* 3.4*  --       Recent Labs (within last 365 days)   Recent Labs  02/19/15 0130  03/14/15 1750  03/17/15 0306 03/20/15 0507 05/13/15 1931 08/13/15  WBC 12.2*  < > 20.1*  < > 19.7* 15.1* 8.0 6.7  NEUTROABS 8.9*  --  15.4*  --   --   --  4.5  --   HGB 8.0*  < > 10.8*  < > 10.9* 10.0* 12.6 10.6*  HCT 25.3*  < > 34.2*  < > 34.6* 32.0* 40.3 36  MCV 95.8  < > 90.2  < > 92.5 92.8 86.9  --   PLT 203  < > 313  < > 309 312 309 330  < > = values in this interval not displayed.   Recent Labs  No results found for: TSH   Recent Labs       Lab Results  Component Value Date   HGBA1C 7.1 (H) 02/08/2015     Recent Labs       Lab Results  Component Value Date   TRIG 137 01/15/2015      Significant Diagnostic Results in last 30 days:   Imaging Results  Dg Abd 1 View  Result Date: 09/30/2015 CLINICAL DATA:  Status post G-tube placement EXAM: ABDOMEN - 1 VIEW COMPARISON:  09/13/2015 FINDINGS: Contrast opacifies the stomach, confirming intraluminal placement. IMPRESSION: Gastrostomy tube in the stomach. Electronically Signed   By: Charline Bills M.D.   On: 09/30/2015  21:52   Ir Replc Gastro/colonic Tube Percut W/fluoro  Result Date: 09/14/2015 INDICATION: Patient with history of prior CVA and dysphagia ; recent dislodgement of chronic indwelling balloon retention gastrostomy tube with temporary replacement of 16 French Foley catheter in the ED on 09/13/15; patient returns today for gastrostomy tube exchange. EXAM: GASTROSTOMY CATHETER REPLACEMENT MEDICATIONS: None ANESTHESIA/SEDATION: None CONTRAST:  None FLUOROSCOPY TIME:  None COMPLICATIONS: None immediate. PROCEDURE: The pre-existing Foley catheter was removed. A new 16 French balloon retention gastrostomy tube was advanced through the tract and into the stomach. The retention balloon was inflated with 6 mL of saline . IMPRESSION: Replacement of gastrostomy tube with advancement of new 16 French balloon retention catheter into the stomach. Gauze dressing applied to site. Read by: Jeananne Rama, PA-C Electronically Signed   By: Malachy Moan M.D.   On: 09/14/2015 15:45    Assessment and plan 1. Diabetes: hgb a1c is 5.5; blood sugars continue to be somewhat low in the 80s on low-dose Lantus 5 units we'll discontinue the Lantus and continue  to monitor CBGs notify provider if CBG greater than 250- obtain log blood sugar for provider review next week  2. Seizure: no reports of seizure activity present: will continue keppra 500 mg twice daily and will monitor   3. 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  3. Hypertension: will continue lopressor 125 mg twice daily and clonidine 0.1  mg twice daily norvasc 10 mg daily   4.  Dysphagia: her po intake is not adequate to maintain her body weight; she is dependent upon peg tube feedings it appears she's lost about 10 pounds since July-Will warrant  dietary follow-up  5. SAH: is neurologically without change; has right hemiparesis; will monitor her status.   6. UTI/urine retention: will continue urecholine 10 mg every 8 hours    7.  Weight loss: will continue her tube feeding per dietician recommendations. Will continue to monitor her status.   Of note Will update a BMP as well as CBC for updated values with her history of hypertension-as well as chronic anticoagulation with Lovenox.  [id number]

## 2015-11-22 ENCOUNTER — Non-Acute Institutional Stay (SKILLED_NURSING_FACILITY): Payer: Medicaid Other | Admitting: Internal Medicine

## 2015-11-22 ENCOUNTER — Encounter: Payer: Self-pay | Admitting: Internal Medicine

## 2015-11-22 DIAGNOSIS — E1149 Type 2 diabetes mellitus with other diabetic neurological complication: Secondary | ICD-10-CM

## 2015-11-22 DIAGNOSIS — R451 Restlessness and agitation: Secondary | ICD-10-CM | POA: Diagnosis not present

## 2015-11-22 DIAGNOSIS — Z7901 Long term (current) use of anticoagulants: Secondary | ICD-10-CM

## 2015-11-22 NOTE — Progress Notes (Signed)
Patient ID: Rachel Vang, female   DOB: 1965/11/29, 50 y.o.   MRN: [id number]   Location:  [facility] Room Number: 111-B Place of Service:  SNF (31) Provider:  Edmon Crape, PA-C  No PCP Per Patient  Patient Care Team: No Pcp Per Patient as PCP - General (General Practice)  Extended Emergency Contact Information Primary Emergency Contact: Vang,Rachel  [place] Mobile Phone: [phone] Relation: Son Secondary Emergency Contact: Rachel Vang [place] Mobile Phone: [phone] Relation: Son  Code Status:  Full Code Goals of care: Advanced Directive information Advanced Directives 11/22/2015  Does patient have an advance directive? No  Type of Advance Directive -  Does patient want to make changes to advanced directive? -  Copy of advanced directive(s) in chart? -  Would patient like information on creating an advanced directive? No - patient declined information     Chief Complaint  Patient presents with  . Acute Visit    Acute   To follow-up insomnia-agitation HPI:  Pt is a 50 y.o. female seen today for an acute visit for nursing note stating that she's had increased insomnia and agitation.  However speaking with nursing today they feel this was more situational  and that she does not appear to have any increased anxiety or agitation --apparently this may have been a one time episode with certain nurse.  Rachel Vang today appears to be smiling and pleasant in good spirits and denies having persistent insomnia or agitation she is pleasant and cooperative as she usually is.  She also is a type II diabetic we have been titrating down her Lantus secondary to use stable somewhat low blood sugars in the past she is now  on 5 units of Lantus and blood sugars continue to be stable it was 101 this morning-120 for this noon-yesterday morning sugar was 210 later in the day was in the mid 100s.  I sees sometimes  80s and 90s-any reading above 200 appears to be quite rare.   Her other medical issues continue to be stable she does have a history seizure but no recent seizures-she does have neurologic deficits with a history of subarachnoid hemorrhage does require tube feedings at night but this appears to be stable.  Vital signs continued to be stable   Past Medical History:  Diagnosis Date  . Acute respiratory failure (HCC)   . Diabetes mellitus without complication (HCC)    Type 2, W/o complications  . Dysphagia   . Epilepsy (HCC)   . GERD (gastroesophageal reflux disease)   . Hyperlipidemia   . Hypertension   . IBS (irritable bowel syndrome)   . Nontraumatic subarachnoid hemorrhage (HCC)   . Urinary retention    Past Surgical History:  Procedure Laterality Date  . ABDOMINAL SURGERY    . ANEURYSM COILING    . COLONOSCOPY N/A 02/20/2015   Procedure: COLONOSCOPY;  Surgeon: Iva Boop, MD;  Location: [facility] ENDOSCOPY;  Service: Endoscopy;  Laterality: N/A;  . ESOPHAGOGASTRODUODENOSCOPY (EGD) WITH PROPOFOL N/A 02/02/2015   Procedure: ESOPHAGOGASTRODUODENOSCOPY (EGD) WITH PROPOFOL;  Surgeon: Jimmye Norman, MD;  Location: [facility] ENDOSCOPY;  Service: General;  Laterality: N/A;  . IR GENERIC HISTORICAL  08/26/2015   IR GASTRIC TUBE PERC CHG W/O IMG GUIDE 08/26/2015 Irish Lack, MD [facility]INTERV RAD  . IR GENERIC HISTORICAL  09/14/2015   IR REPLC GASTRO/COLONIC TUBE PERCUT W/FLUORO 09/14/2015 Darrell K Allred, PA-C [facility]INTERV RAD  . PEG PLACEMENT N/A 02/02/2015   Procedure: PERCUTANEOUS ENDOSCOPIC  GASTROSTOMY (PEG) PLACEMENT;  Surgeon: Jimmye Norman, MD;  Location: [facility] ENDOSCOPY;  Service: General;  Laterality: N/A;  . RADIOLOGY WITH ANESTHESIA N/A 01/13/2015   Procedure: RADIOLOGY WITH ANESTHESIA;  Surgeon: Lisbeth Renshaw, MD;  Location: [facility] OR;  Service: Radiology;  Laterality: N/A;  . TRACHEOSTOMY      No Known Allergies    Medication List       Accurate as of 11/22/15 11:04 AM. Always use your most  recent med list.          acetaminophen 325 MG tablet Commonly known as:  TYLENOL Take 650 mg by mouth every 4 (four) hours as needed for mild pain or fever.   amLODipine 10 MG tablet Commonly known as:  NORVASC 10 mg by Feeding Tube route daily.   bethanechol 10 MG tablet Commonly known as:  URECHOLINE 10 mg by PEG Tube route every 8 (eight) hours.   CALAZIME SKIN PROTECTANT EX Apply 1 application topically 2 (two) times daily.   cloNIDine 0.1 MG tablet Commonly known as:  CATAPRES Take 0.1 mg by mouth 2 (two) times daily.   DECUBI-VITE Caps Take 1 capsule by mouth daily.   insulin glargine 100 UNIT/ML injection Commonly known as:  LANTUS Inject 5 Units into the skin at bedtime.   levETIRAcetam 100 MG/ML solution Commonly known as:  KEPPRA Place 5 mLs (500 mg total) into feeding tube 2 (two) times daily.   LOVENOX 60 MG/0.6ML injection Generic drug:  enoxaparin Inject 60 mg into the skin every 12 (twelve) hours.   metoprolol 100 MG tablet Commonly known as:  LOPRESSOR Place 100 mg into feeding tube 2 (two) times daily.   metoprolol tartrate 25 MG tablet Commonly known as:  LOPRESSOR Place 25 mg into feeding tube 2 (two) times daily.   NUTRITIONAL SUPPLEMENT PO Place 60 mLs into feeding tube 3 (three) times daily. "protein liquid" to promote wound healing       Review of Systems  This is limited secondary to patient's limited verbalizations however she was quite adamant she was having any agitation or insomnia saying that this was not the case was more very transitory it appears Indications this was more of a one time episode.  She is not complaining of any pain at this time apparently is tolerating her PEG feedings well.       Immunization History  Administered Date(s) Administered  . Influenza-Unspecified 03/09/2015   Pertinent  Health Maintenance Due  Topic Date Due  . HEMOGLOBIN A1C  08/08/2015  . FOOT EXAM  01/30/2016 (Originally 05/04/1973)    . MAMMOGRAM  01/30/2016 (Originally 05/04/2013)  . PAP SMEAR  01/30/2016 (Originally 05/04/1984)  . OPHTHALMOLOGY EXAM  01/30/2016 (Originally 05/04/1973)  . URINE MICROALBUMIN  01/30/2016 (Originally 05/04/1973)  . INFLUENZA VACCINE  03/22/2016 (Originally 08/30/2015)  . COLONOSCOPY  02/19/2025   No flowsheet data found. Functional Status Survey:    Vitals:   11/22/15 1100  BP: 118/60  Pulse: 78  Resp: 16  Temp: 97.2 F (36.2 C)  TempSrc: Oral  SpO2: 98%  Weight: 117 lb (53.1 kg)  Height: 5\' 3"  (1.6 m)   Body mass index is 20.73 kg/m. Physical Exam   Constitutional: No distress.  lying comfortably in bed She continues to pleasant smiling Her skin is warm and dry Eyes: Conjunctivae are normal.   Cardiovascular: Normal rate, regular rhythm and intact distal pulses.  Respiratory: Effort normal and breath sounds normal. No respiratory distress. She has no wheezes.  GI: Soft. Bowel sounds  are normal. She exhibits no distension. There is no tenderness.  Has peg tube appears unremarkable without any surrounding drainage or erythema -does appear lower abdomen there is a bit of contact erythema from the diaper staff is addressing this with topical treatment  Musculoskeletal: She exhibits no edema.  Right hemiparesis  L   Psychiatric: She has a normal mood and affect. Pleasant smiling follow simple verbal commands   Labs reviewed:  Recent Labs  02/04/15 0542 02/05/15 0233 02/06/15 0442  02/10/15 0603  03/21/15 1550 03/22/15 0417 05/13/15 1931 08/13/15  NA 142 139 142  < > 146*  < > 139 138 139 143  K 3.7 4.1 3.8  < > 3.8  < > 3.7 4.1 3.4* 4.0  CL 111 106 115*  < > 112*  < > 103 103 100*  --   CO2 22 23 18*  < > 25  < > 27 25 28   --   GLUCOSE 143* 139* 182*  < > 182*  < > 161* 145* 113*  --   BUN 21* 17 18  < > 22*  < > 9 9 10 17   CREATININE 0.55 0.54 0.54  < > 0.54  < > 0.49 0.48 0.69 0.7  CALCIUM 9.2 9.3 9.3  < > 9.1  < > 9.1 9.5 9.7  --   MG 1.9 1.8 1.9  --  2.0   --   --   --   --   --   PHOS 3.5 3.7 2.6  --   --   --   --   --   --   --   < > = values in this interval not displayed.  Recent Labs  02/17/15 1518 03/14/15 1750 05/13/15 1931 08/13/15  AST 27 24 17 13   ALT 57* 45 19 10  ALKPHOS 144* 144* 91 95  BILITOT 0.1* 0.7 0.6  --   PROT 7.4 8.1 7.4  --   ALBUMIN 2.9* 3.1* 3.4*  --     Recent Labs  02/19/15 0130  03/14/15 1750  03/17/15 0306 03/20/15 0507 05/13/15 1931 08/13/15  WBC 12.2*  < > 20.1*  < > 19.7* 15.1* 8.0 6.7  NEUTROABS 8.9*  --  15.4*  --   --   --  4.5  --   HGB 8.0*  < > 10.8*  < > 10.9* 10.0* 12.6 10.6*  HCT 25.3*  < > 34.2*  < > 34.6* 32.0* 40.3 36  MCV 95.8  < > 90.2  < > 92.5 92.8 86.9  --   PLT 203  < > 313  < > 309 312 309 330  < > = values in this interval not displayed. No results found for: TSH Lab Results  Component Value Date   HGBA1C 7.1 (H) 02/08/2015   Lab Results  Component Value Date   TRIG 137 01/15/2015    Significant Diagnostic Results in last 30 days:  No results found.  Assessment/Plan #1-diabetes type 2-as noted above her blood sugars are stable actually borderline low at times-it appears during last visit Lantus was discontinued but I do not see that [facility] was [facility] will discontinue the Lantus and monitor  #2 agitation and insomnia-this appears to have been very transitory I did discuss this with patient as well as nursing staff today they have not reported really any changes from her baseline which is a pleasant disposition-Will monitor-she denied insomnia today when I spoke with her.  #3  I do note CBC and BMP apparently were ordered during last visit I do not see this is been updated will rewrite an order to obtain this for baseline values. I note she continues on Lovenox for DVT and PE history and not a candidate for Coumadin therapy Xarelto orEliquist apparently secondary to a history of GI bleed and difficulty obtaining an adequate INR with Coumadin.  [id number] note  greater than 25 minutes spent assessing patient-discussing her status with nursing staff-reviewing her chart-and coordinating plan of care

## 2015-11-23 LAB — BASIC METABOLIC PANEL
BUN: 28 mg/dL — AB (ref 4–21)
Creatinine: 0.9 mg/dL (ref 0.5–1.1)
GLUCOSE: 92 mg/dL
Potassium: 4.3 mmol/L (ref 3.4–5.3)
SODIUM: 148 mmol/L — AB (ref 137–147)

## 2015-11-23 LAB — CBC AND DIFFERENTIAL
HEMATOCRIT: 44 % (ref 36–46)
HEMOGLOBIN: 13.8 g/dL (ref 12.0–16.0)
PLATELETS: 225 10*3/uL (ref 150–399)
WBC: 6.3 10^3/mL

## 2015-11-30 ENCOUNTER — Non-Acute Institutional Stay (SKILLED_NURSING_FACILITY): Payer: Medicaid Other | Admitting: Adult Health

## 2015-11-30 ENCOUNTER — Encounter: Payer: Self-pay | Admitting: Adult Health

## 2015-11-30 DIAGNOSIS — I609 Nontraumatic subarachnoid hemorrhage, unspecified: Secondary | ICD-10-CM | POA: Diagnosis not present

## 2015-11-30 DIAGNOSIS — E1149 Type 2 diabetes mellitus with other diabetic neurological complication: Secondary | ICD-10-CM

## 2015-11-30 DIAGNOSIS — I825Y2 Chronic embolism and thrombosis of unspecified deep veins of left proximal lower extremity: Secondary | ICD-10-CM

## 2015-11-30 DIAGNOSIS — R569 Unspecified convulsions: Secondary | ICD-10-CM | POA: Diagnosis not present

## 2015-11-30 DIAGNOSIS — I69351 Hemiplegia and hemiparesis following cerebral infarction affecting right dominant side: Secondary | ICD-10-CM

## 2015-11-30 DIAGNOSIS — I2692 Saddle embolus of pulmonary artery without acute cor pulmonale: Secondary | ICD-10-CM | POA: Diagnosis not present

## 2015-11-30 DIAGNOSIS — R339 Retention of urine, unspecified: Secondary | ICD-10-CM | POA: Diagnosis not present

## 2015-12-26 ENCOUNTER — Encounter: Payer: Self-pay | Admitting: Adult Health

## 2015-12-26 NOTE — Progress Notes (Signed)
Location:   [facility] Room Number: 111-B Place of Service:  SNF (31)   CODE STATUS: full code   No Known Allergies  Chief Complaint  Patient presents with  . Medical Management of Chronic Issues    Follow up    HPI:  She is a long term resident of this facility being seen for the management of her chronic illnesses. Overall her status is stable. She is aware of her surroundings and others. She does get out of bed daily. There are no nursing concerns at this time.    Past Medical History:  Diagnosis Date  . Acute respiratory failure (HCC)   . Diabetes mellitus without complication (HCC)    Type 2, W/o complications  . Dysphagia   . Epilepsy (HCC)   . GERD (gastroesophageal reflux disease)   . Hyperlipidemia   . Hypertension   . IBS (irritable bowel syndrome)   . Nontraumatic subarachnoid hemorrhage (HCC)   . Urinary retention     Past Surgical History:  Procedure Laterality Date  . ABDOMINAL SURGERY    . ANEURYSM COILING    . COLONOSCOPY N/A 02/20/2015   Procedure: COLONOSCOPY;  Surgeon: Iva Booparl E Gessner, MD;  [facility];  Service: Endoscopy;  Laterality: N/A;  . ESOPHAGOGASTRODUODENOSCOPY (EGD) WITH PROPOFOL N/A 02/02/2015   Procedure: ESOPHAGOGASTRODUODENOSCOPY (EGD) WITH PROPOFOL;  Surgeon: Jimmye NormanJames Wyatt, MD;  [facility];  Service: General;  Laterality: N/A;  . IR GENERIC HISTORICAL  08/26/2015   IR GASTRIC TUBE PERC CHG W/O IMG GUIDE 08/26/2015 Irish LackGlenn Yamagata, MD [facility]INTER[facility]  . IR GENERIC HISTORICAL  09/14/2015   IR REPLC GASTRO/COLONIC TUBE PERCUT W/FLUORO 09/14/2015 Darrell K Allred, PA-C [facility]  . PEG PLACEMENT N/A 02/02/2015   Procedure: PERCUTANEOUS ENDOSCOPIC GASTROSTOMY (PEG) PLACEMENT;  Surgeon: Jimmye NormanJames Wyatt, MD;  [facility];  Service: General;  Laterality: N/A;  . RADIOLOGY WITH ANESTHESIA N/A 01/13/2015   Procedure: RADIOLOGY WITH ANESTHESIA;  Surgeon: Lisbeth RenshawNeelesh Nundkumar, MD;  Location: [facility] OR;  Service:  Radiology;  Laterality: N/A;  . TRACHEOSTOMY      Social History   Social History  . Marital status: Single    Spouse name: N/A  . Number of children: N/A  . Years of education: N/A   Occupational History  . Not on file.   Social History Main Topics  . Smoking status: Former Games developermoker  . Smokeless tobacco: Never Used  . Alcohol use No  . Drug use: No  . Sexual activity: Not on file   Other Topics Concern  . Not on file   Social History Narrative  . No narrative on file   History reviewed. No pertinent family history.    VITAL SIGNS BP 114/69   Pulse 64   Temp 97.4 F (36.3 C) (Oral)   Resp 18   Ht 5\' 3"  (1.6 m)   Wt 117 lb (53.1 kg)   LMP  (LMP Unknown)   SpO2 98%   BMI 20.73 kg/m   Patient's Medications  New Prescriptions   No medications on file  Previous Medications   ACETAMINOPHEN (TYLENOL) 325 MG TABLET    Take 650 mg by mouth every 4 (four) hours as needed for mild pain or fever.   AMLODIPINE (NORVASC) 10 MG TABLET    10 mg by Feeding Tube route daily.   BETHANECHOL (URECHOLINE) 10 MG TABLET    10 mg by PEG Tube route every 8 (eight) hours.    CLONIDINE (CATAPRES) 0.1 MG TABLET  Take 0.1 mg by mouth 2 (two) times daily.    ENOXAPARIN (LOVENOX) 60 MG/0.6ML INJECTION    Inject 60 mg into the skin every 12 (twelve) hours.   LEVETIRACETAM (KEPPRA) 100 MG/ML SOLUTION    Place 5 mLs (500 mg total) into feeding tube 2 (two) times daily.   METOPROLOL (LOPRESSOR) 100 MG TABLET    Place 100 mg into feeding tube 2 (two) times daily.   METOPROLOL TARTRATE (LOPRESSOR) 25 MG TABLET    Place 25 mg into feeding tube 2 (two) times daily.   MULTIPLE VITAMINS-MINERALS (DECUBI-VITE) CAPS    Take 1 capsule by mouth daily.   NUTRITIONAL SUPPLEMENTS (NUTRITIONAL SUPPLEMENT PO)    Place 60 mLs into feeding tube 3 (three) times daily. "protein liquid" to promote wound healing    SKIN PROTECTANTS, MISC. (CALAZIME SKIN PROTECTANT EX)    Apply 1 application topically 2 (two) times  daily.  Modified Medications   No medications on file  Discontinued Medications   INSULIN GLARGINE (LANTUS) 100 UNIT/ML INJECTION    Inject 5 Units into the skin at bedtime.      SIGNIFICANT DIAGNOSTIC EXAMS   02-17-15: ct angio of chest: 1. Extensive pulmonary emboli involving distal main pulmonary arteries extending into lobar, segmental and subsegmental sized branches throughout the lungs bilaterally. At this time, there are no overt findings to suggest right heart strain. 2. Cardiomegaly with left ventricular concentric hypertrophy. 3. Dependent atelectasis throughout the lower lobes of the lungs bilaterally.  02-18-15: bilateral lower extremity doppler: Findings consistent with acute deep vein thrombosis involving the left common femoral vein, left proximal profunda femoris vein, and left proximal femoral vein. Incidental findings are consistent with: enlarged lymph node on the right. - No evidence of deep vein thrombosis involving the right lower extremity. - No evidence of Baker&'s cyst on the right or left.  02-19-15: pelvic ultrasound: 1. Uterus not identified, presumed hysterectomy. No mass or free fluid seen within the midline pelvis. 2. Neither ovary is seen, perhaps bilateral oophorectomies, perhaps obscured by the fairly prominent fluid-filled bowel loops in the pelvis. No mass or free fluid seen within either adnexal region.  03-15-15: chest x-ray: 1. Tracheostomy tube is newly absent. No pneumomediastinum or pneumothorax. 2. Stable mild enlargement of the cardiopericardial silhouette. 3. Linear subsegmental atelectasis in the left lower lobe.  03-16-15: chest x-ray: Lungs clear.  Heart prominent but stable   LABS REVIEWED:   02-08-15: hgb a1c 7.1 03-14-15: wbc 20.1; hgb 10.8; hct 34.2; mcv 90.2; plt 313; glucose 119; bun 13; creat 0.46; k+ 4.1; na++131; liver normal albumin 3.1 03-20-15: wbc 15.1; hgb 10.0; hct 32.0; mcv 92.8; plt 312; stool for c-diff: +  03-21-15: glucose  161; bun 9; creat 0.46; k+ 3.7; na++139  04-18-15: INR 3.4  08-06-15: urine culture; e-coli: ESBL: imipenem   hgb a1c 5.5; urine micro-albumin 144.1 08-13-15: wbc 6.7 hgb 10.6; hct 35.9; mcv 89.8; plt 330; glucose 121; bun 16.7; creat 0.67; k+ 4.0; na++ 143; liver normal albumin 3.5   11-02-15: wbc 8.2; hgb 11.8; hct 37.3; mcv 89.3; plt 220; glucose 109; bun 16.6; creat 0.88; k+ 5.2; na++ 136 11-23-15: wbc 6.3; hgb 13.8; hct 43.9; mcv 96.8; plt 225; glucose 92; bun 27.6; creat 0.86; k+ 4.3; na++ 148      Review of Systems  Unable to perform ROS: patient nonverbal     Physical Exam  Constitutional: No distress.  Eyes: Conjunctivae are normal.  Neck: Neck supple. No JVD present. No thyromegaly present.  Cardiovascular: Normal rate, regular rhythm and intact distal pulses.   Respiratory: Effort normal and breath sounds normal. No respiratory distress. She has no wheezes.  GI: Soft. Bowel sounds are normal. She exhibits no distension. There is no tenderness.  Has peg tube  Musculoskeletal: She exhibits no edema.  Right hemiparesis   Lymphadenopathy:    She has no cervical adenopathy.  Neurological: She is alert.  Skin: Skin is warm and dry. She is not diaphoretic.  Psychiatric: She has a normal mood and affect.      ASSESSMENT/ PLAN:  1. Diabetes: hgb a1c is 5.5; is currently off medications; will continue to monitor her status.   2. Seizure: no reports of seizure activity present: will continue keppra 500 mg twice daily and will monitor   3. 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  3. Hypertension: will continue lopressor 25 mg twice daily and clonidine 0.1 mg twice daily norvasc 10 mg daily   4.  Dysphagia: no signs of aspiration present  her po intake is not adequate  to maintain her body weight; she is dependent upon peg tube feedings.  5. SAH: is neurologically without change; has right hemiparesis; will monitor her status.   6. UTI/urine retention: will continue urecholine 10 mg every 8 hours    7.  Weight loss:  Her weight is 117 pounds. will continue her tube feeding per dietician recommendations. Will continue to monitor her status.     Synthia Innocent NP [facility]  Contact [phone] Monday through Friday 8am- 5pm  After hours call [phone]

## 2016-01-03 ENCOUNTER — Encounter: Payer: Self-pay | Admitting: Internal Medicine

## 2016-01-03 ENCOUNTER — Non-Acute Institutional Stay (SKILLED_NURSING_FACILITY): Payer: Medicaid Other | Admitting: Internal Medicine

## 2016-01-03 DIAGNOSIS — R569 Unspecified convulsions: Secondary | ICD-10-CM

## 2016-01-03 DIAGNOSIS — E1149 Type 2 diabetes mellitus with other diabetic neurological complication: Secondary | ICD-10-CM

## 2016-01-03 DIAGNOSIS — I10 Essential (primary) hypertension: Secondary | ICD-10-CM

## 2016-01-03 DIAGNOSIS — I825Y2 Chronic embolism and thrombosis of unspecified deep veins of left proximal lower extremity: Secondary | ICD-10-CM

## 2016-01-03 DIAGNOSIS — R1310 Dysphagia, unspecified: Secondary | ICD-10-CM

## 2016-01-03 DIAGNOSIS — I609 Nontraumatic subarachnoid hemorrhage, unspecified: Secondary | ICD-10-CM

## 2016-01-03 NOTE — Progress Notes (Signed)
Patient ID: Rachel Vang, female   DOB: 24-Oct-1965, 50 y.o.   MRN: [id number]   Location:  [facility] Room Number: 111-B Place of Service:  SNF (31) Provider:  Edmon Crape, PA-C  No PCP Per Patient  Patient Care Team: No Pcp Per Patient as PCP - General (General Practice)  Extended Emergency Contact Information Primary Emergency Contact: Simer,Carlton  [place] Mobile Phone: [phone] Relation: Son Secondary Emergency Contact: Debroah Ball[place] Mobile Phone: [phone] Relation: Son  Code Status:  Full Code Goals of care: Advanced Directive information Advanced Directives 01/03/2016  Does Patient Have a Medical Advance Directive? No  Type of Advance Directive -  Does patient want to make changes to medical advance directive? -  Copy of Healthcare Power of Attorney in Chart? -  Would patient like information on creating a medical advance directive? -     Chief Complaint  Patient presents with  . Medical Management of Chronic Issues    Follow up  Of chronic medical conditions including history of DVT pulmonary embolism-SAH-right-sided hemiparalysis seizure disorder-weight loss-dysphagia- HPI:  Pt is a 50 y.o. female seen today for medical management of chronic diseases.  As noted above-she continues to have a period of stability-nursing staff does not report any recent acute issues.  She continues on tube feedings per dietary recommendations since she cannot maintain her weight and nutritional status just by mouth with a history of dysphagia. Her weight is stable at 117 pounds  She is on long-term Lovenox secondary to history DVT and pulmonary embolism-she's not a candidate for Xarelto or  Eliquis  with a history of GI bleed-not appropriate for Coumadin secondary to unattainable to obtain an adequate INR.  Her hypertension appears controlled on Lopressor 125 mg twice a day and clonidine 0.1 mg twice  a day as well as Norvasc milligrams a day recent blood pressure 120/80.  She does have a history of SAH this appears to be unchanged she does have some right-sided weakness  She also has a history of diabetes type 2 hemoglobin A1c most recently 5.5-July 2017 she's not currently on any medication -Lantus was discontinued secondary to borderline low CBGs     Past Medical History:  Diagnosis Date  . Acute respiratory failure (HCC)   . Diabetes mellitus without complication (HCC)    Type 2, W/o complications  . DVT (deep venous thrombosis) (HCC) 04/05/2015  . Dysphagia   . Epilepsy (HCC)   . GERD (gastroesophageal reflux disease)   . Hyperlipidemia   . Hypertension   . IBS (irritable bowel syndrome)   . Nontraumatic subarachnoid hemorrhage (HCC)   . Urinary retention    Past Surgical History:  Procedure Laterality Date  . ABDOMINAL SURGERY    . ANEURYSM COILING    . COLONOSCOPY N/A 02/20/2015   Procedure: COLONOSCOPY;  Surgeon: Iva Boop, MD;  Location: [facility] ENDOSCOPY;  Service: Endoscopy;  Laterality: N/A;  . ESOPHAGOGASTRODUODENOSCOPY (EGD) WITH PROPOFOL N/A 02/02/2015   Procedure: ESOPHAGOGASTRODUODENOSCOPY (EGD) WITH PROPOFOL;  Surgeon: Jimmye Norman, MD;  Location: [facility] ENDOSCOPY;  Service: General;  Laterality: N/A;  . IR GENERIC HISTORICAL  08/26/2015   IR GASTRIC TUBE PERC CHG W/O IMG GUIDE 08/26/2015 Irish Lack, MD [facility]-INTERV RAD  . IR GENERIC HISTORICAL  09/14/2015   IR REPLC GASTRO/COLONIC TUBE PERCUT W/FLUORO 09/14/2015 Darrell K Allred, PA-C [facility]-INTERV RAD  . PEG PLACEMENT N/A 02/02/2015   Procedure: PERCUTANEOUS ENDOSCOPIC GASTROSTOMY (PEG) PLACEMENT;  Surgeon: Jimmye Norman,  MD;  Location: [facility] ENDOSCOPY;  Service: General;  Laterality: N/A;  . RADIOLOGY WITH ANESTHESIA N/A 01/13/2015   Procedure: RADIOLOGY WITH ANESTHESIA;  Surgeon: Lisbeth RenshawNeelesh Nundkumar, MD;  [facility];  Service: Radiology;  Laterality: N/A;  . TRACHEOSTOMY      No Known Allergies    Medication List         Accurate as of 01/03/16  4:21 PM. Always use your most recent med list.          acetaminophen 325 MG tablet Commonly known as:  TYLENOL Take 650 mg by mouth every 4 (four) hours as needed for mild pain or fever.   amLODipine 10 MG tablet Commonly known as:  NORVASC 10 mg by Feeding Tube route daily.   bethanechol 10 MG tablet Commonly known as:  URECHOLINE 10 mg by PEG Tube route every 8 (eight) hours.   CALAZIME SKIN PROTECTANT EX Apply 1 application topically 2 (two) times daily.   cloNIDine 0.1 MG tablet Commonly known as:  CATAPRES Take 0.1 mg by mouth 2 (two) times daily.   DECUBI-VITE Caps Take 1 capsule by mouth daily.   hydrocortisone cream 1 % Apply 1 application topically 3 (three) times daily.   levETIRAcetam 100 MG/ML solution Commonly known as:  KEPPRA Place 5 mLs (500 mg total) into feeding tube 2 (two) times daily.   LOVENOX 60 MG/0.6ML injection Generic drug:  enoxaparin Inject 60 mg into the skin every 12 (twelve) hours.   metoprolol 100 MG tablet Commonly known as:  LOPRESSOR Place 100 mg into feeding tube 2 (two) times daily.   metoprolol tartrate 25 MG tablet Commonly known as:  LOPRESSOR Place 25 mg into feeding tube 2 (two) times daily.   NUTRITIONAL SUPPLEMENT PO Place 60 mLs into feeding tube 3 (three) times daily. "protein liquid" to promote wound healing       Review of Systems  very limited Secondary to Having limited ability to speak-however when asked if she's having any chest pain abdominal pain or discomfort she states no---- continues to be pleasant smiling  Immunization History  Administered Date(s) Administered  . Influenza-Unspecified 03/09/2015   Pertinent  Health Maintenance Due  Topic Date Due  . HEMOGLOBIN A1C  08/08/2015  . FOOT EXAM  01/30/2016 (Originally 05/04/1973)  . MAMMOGRAM  01/30/2016 (Originally 05/04/2013)  . PAP SMEAR  01/30/2016 (Originally 05/04/1984)  . OPHTHALMOLOGY EXAM  01/30/2016 (Originally  05/04/1973)  . URINE MICROALBUMIN  01/30/2016 (Originally 05/04/1973)  . COLONOSCOPY  02/19/2025  . INFLUENZA VACCINE  Completed   No flowsheet data found. Functional Status Survey:    Vitals:   01/03/16 1616  BP: 120/80  Pulse: 88  Resp: 18  Temp: 97.8 F (36.6 C)  TempSrc: Oral  SpO2: 98%  Weight: 117 lb 6 oz (53.2 kg)  Height: 5\' 3"  (1.6 m)   Body mass index is 20.79 kg/m. Physical Exam Constitutional: No distress. lying comfortably in bed She continues to pleasant smiling Her skin is warm and dry she does have some crusting of her right heel Eyes: Conjunctivae are normal.   Cardiovascular: Normal rate, regular rhythm and intact distal pulses.  Respiratory: Effort normal and breath sounds normal. No respiratory distress. She has no wheezes.  GI: Soft. Bowel sounds are normal. She exhibits no distension. There is no tenderness.  Has peg tube appears unremarkable without any surrounding drainage or erythema--she does have a protective binder applied  Musculoskeletal: She exhibits no edema.  Right hemiparesis --but does have some movement  and strength especially right upper extremity  L   Psychiatric: She has a normal mood and affect.Pleasant smiling follow simple verbal commands  Labs reviewed:  Recent Labs  02/04/15 0542 02/05/15 0233 02/06/15 0442  02/10/15 0603  03/21/15 1550 03/22/15 0417 05/13/15 1931 08/13/15 11/23/15  NA 142 139 142  < > 146*  < > 139 138 139 143 148*  K 3.7 4.1 3.8  < > 3.8  < > 3.7 4.1 3.4* 4.0 4.3  CL 111 106 115*  < > 112*  < > 103 103 100*  --   --   CO2 22 23 18*  < > 25  < > 27 25 28   --   --   GLUCOSE 143* 139* 182*  < > 182*  < > 161* 145* 113*  --   --   BUN 21* 17 18  < > 22*  < > 9 9 10 17  28*  CREATININE 0.55 0.54 0.54  < > 0.54  < > 0.49 0.48 0.69 0.7 0.9  CALCIUM 9.2 9.3 9.3  < > 9.1  < > 9.1 9.5 9.7  --   --   MG 1.9 1.8 1.9  --  2.0  --   --   --   --   --   --   PHOS 3.5 3.7 2.6  --   --   --   --   --   --    --   --   < > = values in this interval not displayed.  Recent Labs  02/17/15 1518 03/14/15 1750 05/13/15 1931 08/13/15  AST 27 24 17 13   ALT 57* 45 19 10  ALKPHOS 144* 144* 91 95  BILITOT 0.1* 0.7 0.6  --   PROT 7.4 8.1 7.4  --   ALBUMIN 2.9* 3.1* 3.4*  --     Recent Labs  02/19/15 0130  03/14/15 1750  03/17/15 0306 03/20/15 0507 05/13/15 1931 08/13/15 11/23/15  WBC 12.2*  < > 20.1*  < > 19.7* 15.1* 8.0 6.7 6.3  NEUTROABS 8.9*  --  15.4*  --   --   --  4.5  --   --   HGB 8.0*  < > 10.8*  < > 10.9* 10.0* 12.6 10.6* 13.8  HCT 25.3*  < > 34.2*  < > 34.6* 32.0* 40.3 36 44  MCV 95.8  < > 90.2  < > 92.5 92.8 86.9  --   --   PLT 203  < > 313  < > 309 312 309 330 225  < > = values in this interval not displayed. No results found for: TSH Lab Results  Component Value Date   HGBA1C 7.1 (H) 02/08/2015   Lab Results  Component Value Date   TRIG 137 01/15/2015    Significant Diagnostic Results in last 30 days:  No results found.  Assessment/Plan   1. Diabetes: hgb a1c is 5.5H per July lab; is currently off medications; will continue to monitor her status at one point she had been on low-dose Lantus but there was concern about some low blood sugars.  Will update hemoglobin A1c and also check CBGs QD  2. Seizure: no reports of seizure activity present: will continue keppra 500 mg twice daily and will monitor   3. 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  3. Hypertension: will continue lopressor125 mg twice daily and clonidine 0.1 mg twice daily norvasc 10 mg daily   4.  Dysphagia: no signs of aspiration present  her po intake is not adequate to maintain her body weight; she is dependent upon peg tube feedings.  5. SAH: is neurologically without change; has  right hemiparesis; will monitor her status does have some right-sided strength.   6. UTI/urine retention: will continue urecholine 10 mg every 8 hours    7.  Weight loss:  Her weight is stable at  117 pounds. will continue her tube feeding per dietician recommendations. Will continue to monitor her status.  #8 mild hypernatremia of 148 on lab done in late October will update this she does not appear to be overtly dry again is receiving PEG feedings   [id number]

## 2016-02-23 ENCOUNTER — Non-Acute Institutional Stay (SKILLED_NURSING_FACILITY): Payer: Medicaid Other | Admitting: Adult Health

## 2016-02-23 DIAGNOSIS — R1310 Dysphagia, unspecified: Secondary | ICD-10-CM | POA: Diagnosis not present

## 2016-02-23 DIAGNOSIS — I825Y2 Chronic embolism and thrombosis of unspecified deep veins of left proximal lower extremity: Secondary | ICD-10-CM | POA: Diagnosis not present

## 2016-02-23 DIAGNOSIS — R569 Unspecified convulsions: Secondary | ICD-10-CM

## 2016-02-23 DIAGNOSIS — E1149 Type 2 diabetes mellitus with other diabetic neurological complication: Secondary | ICD-10-CM

## 2016-02-23 DIAGNOSIS — I609 Nontraumatic subarachnoid hemorrhage, unspecified: Secondary | ICD-10-CM | POA: Diagnosis not present

## 2016-02-23 DIAGNOSIS — I2692 Saddle embolus of pulmonary artery without acute cor pulmonale: Secondary | ICD-10-CM

## 2016-02-23 DIAGNOSIS — Z931 Gastrostomy status: Secondary | ICD-10-CM

## 2016-02-23 DIAGNOSIS — I69351 Hemiplegia and hemiparesis following cerebral infarction affecting right dominant side: Secondary | ICD-10-CM

## 2016-03-17 ENCOUNTER — Emergency Department (HOSPITAL_COMMUNITY)
Admission: EM | Admit: 2016-03-17 | Discharge: 2016-03-18 | Disposition: A | Discharge status: Home / Self Care | Payer: Medicaid Other | Attending: Emergency Medicine | Admitting: Emergency Medicine

## 2016-03-17 ENCOUNTER — Encounter (HOSPITAL_COMMUNITY): Payer: Self-pay | Admitting: Emergency Medicine

## 2016-03-17 ENCOUNTER — Emergency Department (HOSPITAL_COMMUNITY): Payer: Medicaid Other

## 2016-03-17 DIAGNOSIS — E119 Type 2 diabetes mellitus without complications: Secondary | ICD-10-CM | POA: Diagnosis not present

## 2016-03-17 DIAGNOSIS — Z0189 Encounter for other specified special examinations: Secondary | ICD-10-CM

## 2016-03-17 DIAGNOSIS — K9429 Other complications of gastrostomy: Secondary | ICD-10-CM | POA: Insufficient documentation

## 2016-03-17 DIAGNOSIS — Z431 Encounter for attention to gastrostomy: Secondary | ICD-10-CM

## 2016-03-17 DIAGNOSIS — I10 Essential (primary) hypertension: Secondary | ICD-10-CM | POA: Diagnosis not present

## 2016-03-17 DIAGNOSIS — Z87891 Personal history of nicotine dependence: Secondary | ICD-10-CM | POA: Insufficient documentation

## 2016-03-17 DIAGNOSIS — T85528A Displacement of other gastrointestinal prosthetic devices, implants and grafts, initial encounter: Secondary | ICD-10-CM

## 2016-03-17 MED ORDER — IOPAMIDOL (ISOVUE-300) INJECTION 61%
INTRAVENOUS | Status: AC
  Filled 2016-03-17: qty 30

## 2016-03-17 MED ORDER — LIDOCAINE HCL 2 % EX GEL
1.0000 "application " | Freq: Once | CUTANEOUS | Status: AC
  Administered 2016-03-17: 1 via TOPICAL
  Filled 2016-03-17: qty 11

## 2016-03-17 NOTE — ED Triage Notes (Signed)
Brought in by EMS from [facility] for replacement of pulled out G-Tube.  Pt accidentally pulled out G-Tube, approximately 45 minutes ago.  Facility staff did not attempt to reinsert another g-tube.

## 2016-03-17 NOTE — ED Notes (Signed)
Bed: WA09 Expected date:  Expected time:  Means of arrival:  Comments: 51 yo F/ G-Tube placement

## 2016-03-17 NOTE — ED Provider Notes (Signed)
[facility]-EMERGENCY DEPT Provider Note   CSN: [id number] Arrival date & time: 03/17/16  2228  By signing my name below, I, Linna DarnerRussell Turner, attest that this documentation has been prepared under the direction and in the presence of Terance HartKelly Marceil Welp, PA-C. Electronically Signed: Linna Darnerussell Turner, Scribe. 03/17/2016. 10:57 PM.  History   Chief Complaint Chief Complaint  Patient presents with  . G-Tube Pulled Out    The history is provided by the patient. No language interpreter was used.     HPI Comments: Lucienne MinksStephanie N Casebolt is a 51 y.o. 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 She states she accidentally pulled her G-tube out possibly while sleeping. Pt reports some abdominal pain around the insertion site and notes this is baseline for her. She denies fever. [facility]  Past Medical History:  Diagnosis Date  . Acute respiratory failure (HCC)   . Diabetes mellitus without complication (HCC)    Type 2, W/o complications  . DVT (deep venous thrombosis) (HCC) 04/05/2015  . Dysphagia   . Epilepsy (HCC)   . GERD (gastroesophageal reflux disease)   . Hyperlipidemia   . Hypertension   . IBS (irritable bowel syndrome)   . Nontraumatic subarachnoid hemorrhage (HCC)   . Urinary retention     Patient Active Problem List   Diagnosis Date Noted  . Hemiparesis affecting right side as late effect of stroke (HCC) 09/25/2015  . Infection due to ESBL-producing Escherichia coli 08/27/2015  . Chronic respiratory failure with hypoxia (HCC) 08/08/2015  . Type II diabetes mellitus with neurological manifestations (HCC) 05/20/2015  . Essential hypertension, benign 05/20/2015  . S/P percutaneous endoscopic gastrostomy (PEG) tube placement (HCC) 05/20/2015  . DVT (deep venous thrombosis) (HCC) 04/05/2015  .  Protein-calorie malnutrition, severe (HCC) 03/25/2015  . UTI (lower urinary tract infection)   . Acute pulmonary embolism (HCC) 02/19/2015  . Lower GI bleeding 02/19/2015  . Dysphagia 02/18/2015  . Seizures (HCC) 02/18/2015  . Urinary retention 02/18/2015  . History of ETT   . Hydrocephalus   . Neurological abnormality   . Subarachnoid hemorrhage (HCC)   . SAH (subarachnoid hemorrhage) (HCC)     Past Surgical History:  Procedure Laterality Date  . ABDOMINAL SURGERY    . ANEURYSM COILING    . COLONOSCOPY N/A 02/20/2015   Procedure: COLONOSCOPY;  Surgeon: Iva Booparl E Gessner, MD;  [facility];  Service: Endoscopy;  Laterality: N/A;  . ESOPHAGOGASTRODUODENOSCOPY (EGD) WITH PROPOFOL N/A 02/02/2015   Procedure: ESOPHAGOGASTRODUODENOSCOPY (EGD) WITH PROPOFOL;  Surgeon: Jimmye NormanJames Wyatt, MD;  [facility];  Service: General;  Laterality: N/A;  . IR GENERIC HISTORICAL  08/26/2015   IR GASTRIC TUBE PERC CHG W/O IMG GUIDE 08/26/2015 Irish LackGlenn Yamagata, MD [facility]INTERV RAD  . IR GENERIC HISTORICAL  09/14/2015   IR REPLC GASTRO/COLONIC TUBE PERCUT W/FLUORO 09/14/2015 Darrell K Allred, PA-C [facility]INTERV RAD  . PEG PLACEMENT N/A 02/02/2015   Procedure: PERCUTANEOUS ENDOSCOPIC GASTROSTOMY (PEG) PLACEMENT;  Surgeon: Jimmye NormanJames Wyatt, MD;  [facility];  Service: General;  Laterality: N/A;  . RADIOLOGY WITH ANESTHESIA N/A 01/13/2015   Procedure: RADIOLOGY WITH ANESTHESIA;  Surgeon: Lisbeth RenshawNeelesh Nundkumar, MD;  Location: [facility] OR;  Service: Radiology;  Laterality: N/A;  . TRACHEOSTOMY      OB History    No data available       Home  Medications    Prior to Admission medications   Medication Sig Start Date End Date Taking? Authorizing Provider  acetaminophen (TYLENOL) 325 MG tablet Take 650 mg by mouth every 4 (four) hours as needed for mild pain or fever.    Historical Provider, MD  amLODipine (NORVASC) 10 MG tablet 10 mg by Feeding Tube route daily.    Historical Provider, MD  bethanechol  (URECHOLINE) 10 MG tablet 10 mg by PEG Tube route every 8 (eight) hours.     Historical Provider, MD  cloNIDine (CATAPRES) 0.1 MG tablet Take 0.1 mg by mouth 2 (two) times daily.     Historical Provider, MD  enoxaparin (LOVENOX) 60 MG/0.6ML injection Inject 60 mg into the skin every 12 (twelve) hours.    Historical Provider, MD  hydrocortisone cream 1 % Apply 1 application topically 3 (three) times daily.    Historical Provider, MD  levETIRAcetam (KEPPRA) 100 MG/ML solution Place 5 mLs (500 mg total) into feeding tube 2 (two) times daily. 02/11/15   Leroy Sea, MD  metoprolol (LOPRESSOR) 100 MG tablet Place 100 mg into feeding tube 2 (two) times daily.    Historical Provider, MD  metoprolol tartrate (LOPRESSOR) 25 MG tablet Place 25 mg into feeding tube 2 (two) times daily.    Historical Provider, MD  Multiple Vitamins-Minerals (DECUBI-VITE) CAPS Take 1 capsule by mouth daily.    Historical Provider, MD  Nutritional Supplements (NUTRITIONAL SUPPLEMENT PO) Place 60 mLs into feeding tube 3 (three) times daily. "protein liquid" to promote wound healing     Historical Provider, MD  Skin Protectants, Misc. (CALAZIME SKIN PROTECTANT EX) Apply 1 application topically 2 (two) times daily.    Historical Provider, MD    Family History History reviewed. No pertinent family history.  Social History Social History  Substance Use Topics  . Smoking status: Former Games developer  . Smokeless tobacco: Never Used  . Alcohol use No     Allergies   Patient has no known allergies.   Review of Systems Review of Systems  Constitutional: Negative for fever.  Gastrointestinal: Positive for abdominal pain. Negative for blood in stool, constipation, diarrhea, nausea and vomiting.  All other systems reviewed and are negative.    Physical Exam Updated Vital Signs BP 122/87   Pulse 93   Temp 97.5 F (36.4 C) (Oral)   Resp 20   LMP  (LMP Unknown)   SpO2 100%   Physical Exam  Constitutional: She is  oriented to person, place, and time. No distress.  Chronically ill appearing  HENT:  Head: Normocephalic and atraumatic.  Eyes: Conjunctivae and EOM are normal.  Neck: Neck supple. No tracheal deviation present.  Cardiovascular: Normal rate.   Pulmonary/Chest: Effort normal. No respiratory distress.  Abdominal: Soft. She exhibits no distension and no mass. There is tenderness (mild tenderness around stoma). There is no rebound and no guarding. No hernia.  Stoma is pink without evidence of infection  Musculoskeletal: Normal range of motion.  Neurological: She is alert and oriented to person, place, and time.  Skin: Skin is warm and dry.  Psychiatric: She has a normal mood and affect. Her behavior is normal.  Nursing note and vitals reviewed.   ED Treatments / Results  Labs (all labs ordered are listed, but only abnormal results are displayed) Labs Reviewed - No data to display  EKG  EKG Interpretation None       Radiology Dg Abdomen Peg Tube Location  Result Date: 03/18/2016 CLINICAL DATA:  Confirm G-tube  placement. EXAM: ABDOMEN - 1 VIEW COMPARISON:  09/30/2015 FINDINGS: Contrast material was apparently been injected into the gastrostomy tube. Contrast material is demonstrated in the stomach. This suggests location of the gastrostomy tube tip in the stomach. No contrast extravasation is demonstrated. Scattered gas-filled mid abdominal bowel loops are nonspecific without dilatation. This likely represents mild ileus or enteritis. IMPRESSION: Gastrostomy tube tip is in the stomach. No contrast extravasation is demonstrated. Electronically Signed   By: Burman Nieves M.D.   On: 03/18/2016 00:18    Procedures FEEDING TUBE REPLACEMENT Date/Time: 03/17/2016 11:50 PM Performed by: Terance Hart MARIE Authorized by: Terance Hart MARIE  Consent: Verbal consent obtained. Risks and benefits: risks, benefits and alternatives were discussed Consent given by: patient Patient  understanding: patient states understanding of the procedure being performed Site marked: the operative site was marked Imaging studies: imaging studies available Patient identity confirmed: arm band Time out: Immediately prior to procedure a "time out" was called to verify the correct patient, procedure, equipment, support staff and site/side marked as required. Preparation: Patient was prepped and draped in the usual sterile fashion. Indications: tube removed by patient Local anesthesia used: yes  Anesthesia: Local anesthesia used: yes Local Anesthetic: topical anesthetic (lidocaine jelly)  Sedation: Patient sedated: no Tube type: gastrostomy Patient position: supine Procedure type: replacement Tube size: 16 Fr (Mickey) Endoscope used: no Bulb inflation volume: 5 (ml) Bulb inflation fluid: normal saline Placement/position confirmation: x-ray Tube placement difficulty: moderate Patient tolerance: Patient tolerated the procedure well with no immediate complications    (including critical care time)   DIAGNOSTIC STUDIES: Oxygen Saturation is 100% on RA, normal by my interpretation.    COORDINATION OF CARE: 11:05 PM Discussed treatment plan with pt at bedside and pt agreed to plan.  Medications Ordered in ED Medications  lidocaine (XYLOCAINE) 2 % jelly 1 application (1 application Topical Given by Other 03/17/16 2328)     Initial Impression / Assessment and Plan / ED Course  I have reviewed the triage vital signs and the nursing notes.  Pertinent labs & imaging results that were available during my care of the patient were reviewed by me and considered in my medical decision making (see chart for details).  51 year old female presents with dislodged G-tube since last night. Attempted to place tube back in and was unsuccessful. Attempted 12 and 14 G French foley which was able to be inserted with some resistance. Shared visit with Dr. Mora Bellman who also attempted to place  original G-tube and was unsuccessful. Attempted to insert 16 g Mic-key g tube which was successful. Placement was confirmed with abdominal xray. Will d/c patient back to SNF. Return precautions given.  Final Clinical Impressions(s) / ED Diagnoses   Final diagnoses:  Dislodged gastrostomy tube [facility])    New Prescriptions New Prescriptions   No medications on file   I personally performed the services described in this documentation, which was scribed in my presence. The recorded information has been reviewed and is accurate.    Bethel Born, PA-C 03/18/16 1610    Tomasita Crumble, MD 03/18/16 [phone]

## 2016-03-18 NOTE — ED Notes (Signed)
[facility] here to [facility].

## 2016-03-18 NOTE — ED Notes (Signed)
[facility] staff was called and notified of pt's discharge.

## 2016-03-18 NOTE — Progress Notes (Signed)
Location:   [facility]   Place of Service:  SNF (31)   CODE STATUS: full code   No Known Allergies  Chief Complaint  Patient presents with  . Medical Management of Chronic Issues    HPI:  She is a long term resident of this facility being seen for the management of her chronic illnesses. Overall there is little change in her status. She does get out of bed daily. She is unable to participate in the hpi or ros. There are no nursing concerns today.    Past Medical History:  Diagnosis Date  . Acute respiratory failure (HCC)   . Diabetes mellitus without complication (HCC)    Type 2, W/o complications  . DVT (deep venous thrombosis) (HCC) 04/05/2015  . Dysphagia   . Epilepsy (HCC)   . GERD (gastroesophageal reflux disease)   . Hyperlipidemia   . Hypertension   . IBS (irritable bowel syndrome)   . Nontraumatic subarachnoid hemorrhage (HCC)   . Urinary retention     Past Surgical History:  Procedure Laterality Date  . ABDOMINAL SURGERY    . ANEURYSM COILING    . COLONOSCOPY N/A 02/20/2015   Procedure: COLONOSCOPY;  Surgeon: Iva Boop, MD;  Location: [facility] ENDOSCOPY;  Service: Endoscopy;  Laterality: N/A;  . ESOPHAGOGASTRODUODENOSCOPY (EGD) WITH PROPOFOL N/A 02/02/2015   Procedure: ESOPHAGOGASTRODUODENOSCOPY (EGD) WITH PROPOFOL;  Surgeon: Jimmye Norman, MD;  Location: [facility] ENDOSCOPY;  Service: General;  Laterality: N/A;  . IR GENERIC HISTORICAL  08/26/2015   IR GASTRIC TUBE PERC CHG W/O IMG GUIDE 08/26/2015 Irish Lack, MD [facility]  . IR GENERIC HISTORICAL  09/14/2015   IR REPLC GASTRO/COLONIC TUBE PERCUT W/FLUORO 09/14/2015 Darrell K Allred, PA-C [facility]  . PEG PLACEMENT N/A 02/02/2015   Procedure: PERCUTANEOUS ENDOSCOPIC GASTROSTOMY (PEG) PLACEMENT;  Surgeon: Jimmye Norman, MD;  Location: [facility] ENDOSCOPY;  Service: General;  Laterality: N/A;  . RADIOLOGY WITH ANESTHESIA N/A 01/13/2015   Procedure: RADIOLOGY WITH ANESTHESIA;  Surgeon: Lisbeth Renshaw, MD;  Location: [facility]  OR;  Service: Radiology;  Laterality: N/A;  . TRACHEOSTOMY      Social History   Social History  . Marital status: Single    Spouse name: N/A  . Number of children: N/A  . Years of education: N/A   Occupational History  . Not on file.   Social History Main Topics  . Smoking status: Former Games developer  . Smokeless tobacco: Never Used  . Alcohol use No  . Drug use: No  . Sexual activity: Not on file   Other Topics Concern  . Not on file   Social History Narrative  . No narrative on file   No family history on file.    VITAL SIGNS BP (!) 122/59   Pulse 60   Temp 98 F (36.7 C)   Resp 18   Ht 5' (1.524 m)   Wt 113 lb 9.6 oz (51.5 kg)   LMP  (LMP Unknown)   SpO2 99%   BMI 22.19 kg/m   Patient's Medications  New Prescriptions   No medications on file  Previous Medications   ACETAMINOPHEN (TYLENOL) 325 MG TABLET    Take 650 mg by mouth every 4 (four) hours as needed for mild pain or fever.   AMLODIPINE (NORVASC) 10 MG TABLET    10 mg by Feeding Tube route daily.   BETHANECHOL (URECHOLINE) 10 MG TABLET    10 mg by PEG Tube route every 8 (eight) hours.    CLONIDINE (CATAPRES) 0.1  MG TABLET    Take 0.1 mg by mouth 2 (two) times daily.    ENOXAPARIN (LOVENOX) 60 MG/0.6ML INJECTION    Inject 60 mg into the skin every 12 (twelve) hours.   HYDROCORTISONE CREAM 1 %    Apply 1 application topically 3 (three) times daily.   LEVETIRACETAM (KEPPRA) 100 MG/ML SOLUTION    Place 5 mLs (500 mg total) into feeding tube 2 (two) times daily.   METOPROLOL (LOPRESSOR) 100 MG TABLET    Place 100 mg into feeding tube 2 (two) times daily.   METOPROLOL TARTRATE (LOPRESSOR) 25 MG TABLET    Place 25 mg into feeding tube 2 (two) times daily.   MULTIPLE VITAMINS-MINERALS (DECUBI-VITE) CAPS    Take 1 capsule by mouth daily.   NUTRITIONAL SUPPLEMENTS (NUTRITIONAL SUPPLEMENT PO)    Place 60 mLs into feeding tube 3 (three) times daily. "protein liquid" to promote wound healing    SKIN PROTECTANTS,  MISC. (CALAZIME SKIN PROTECTANT EX)    Apply 1 application topically 2 (two) times daily.  Modified Medications   No medications on file  Discontinued Medications   No medications on file     SIGNIFICANT DIAGNOSTIC EXAMS   02-17-15: ct angio of chest: 1. Extensive pulmonary emboli involving distal main pulmonary arteries extending into lobar, segmental and subsegmental sized branches throughout the lungs bilaterally. At this time, there are no overt findings to suggest right heart strain. 2. Cardiomegaly with left ventricular concentric hypertrophy. 3. Dependent atelectasis throughout the lower lobes of the lungs bilaterally.  02-18-15: bilateral lower extremity doppler: Findings consistent with acute deep vein thrombosis involving the left common femoral vein, left proximal profunda femoris vein, and left proximal femoral vein. Incidental findings are consistent with: enlarged lymph node on the right. - No evidence of deep vein thrombosis involving the right lower extremity. - No evidence of Baker&'s cyst on the right or left.  02-19-15: pelvic ultrasound: 1. Uterus not identified, presumed hysterectomy. No mass or free fluid seen within the midline pelvis. 2. Neither ovary is seen, perhaps bilateral oophorectomies, perhaps obscured by the fairly prominent fluid-filled bowel loops in the pelvis. No mass or free fluid seen within either adnexal region.  03-15-15: chest x-ray: 1. Tracheostomy tube is newly absent. No pneumomediastinum or pneumothorax. 2. Stable mild enlargement of the cardiopericardial silhouette. 3. Linear subsegmental atelectasis in the left lower lobe.  03-16-15: chest x-ray: Lungs clear.  Heart prominent but stable   LABS REVIEWED:   02-08-15: hgb a1c 7.1 03-14-15: wbc 20.1; hgb 10.8; hct 34.2; mcv 90.2; plt 313; glucose 119; bun 13; creat 0.46; k+ 4.1; na++131; liver normal albumin 3.1 03-20-15: wbc 15.1; hgb 10.0; hct 32.0; mcv 92.8; plt 312; stool for c-diff: +    03-21-15: glucose 161; bun 9; creat 0.46; k+ 3.7; na++139  04-18-15: INR 3.4  08-06-15: urine culture; e-coli: ESBL: imipenem   hgb a1c 5.5; urine micro-albumin 144.1 08-13-15: wbc 6.7 hgb 10.6; hct 35.9; mcv 89.8; plt 330; glucose 121; bun 16.7; creat 0.67; k+ 4.0; na++ 143; liver normal albumin 3.5   11-02-15: wbc 8.2; hgb 11.8; hct 37.3; mcv 89.3; plt 220; glucose 109; bun 16.6; creat 0.88; k+ 5.2; na++ 136 11-23-15: wbc 6.3; hgb 13.8; hct 43.9; mcv 96.8; plt 225; glucose 92; bun 27.6; creat 0.86; k+ 4.3; na++ 148      Review of Systems  Unable to perform ROS: patient nonverbal     Physical Exam  Constitutional: No distress.  Eyes: Conjunctivae are normal.  Neck:  Neck supple. No JVD present. No thyromegaly present.  Cardiovascular: Normal rate, regular rhythm and intact distal pulses.   Respiratory: Effort normal and breath sounds normal. No respiratory distress. She has no wheezes.  GI: Soft. Bowel sounds are normal. She exhibits no distension. There is no tenderness.  Has peg tube  Musculoskeletal: She exhibits no edema.  Right hemiparesis   Lymphadenopathy:    She has no cervical adenopathy.  Neurological: She is alert.  Skin: Skin is warm and dry. She is not diaphoretic.  Psychiatric: She has a normal mood and affect.      ASSESSMENT/ PLAN:  1. Diabetes: hgb a1c is 5.5; is currently off medications; will continue to monitor her status.   2. Seizure: no reports of seizure activity present: will continue keppra 500 mg twice daily and will monitor   3. 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  3. Hypertension: will continue lopressor 25 mg twice daily and clonidine 0.1 mg twice daily norvasc 10 mg daily   4.  Dysphagia: no signs of aspiration present  her po intake  is not adequate to maintain her body weight; she is dependent upon peg tube feedings.  5. SAH: is neurologically without change; has right hemiparesis; will monitor her status.   6. UTI/urine retention: will continue urecholine 10 mg every 8 hours    7.  Weight loss:  Her weight is 113 pounds. will continue her tube feeding per dietician recommendations. Will continue to monitor her status.    Will check hgb a1c lipids     Synthia Innocenteborah Green NP [facility]  Contact [phone] Monday through Friday 8am- 5pm  After hours call [phone]

## 2016-03-18 NOTE — ED Notes (Signed)
[facility] was called and notified of pt's need of transportation back to [facility].

## 2016-03-27 ENCOUNTER — Non-Acute Institutional Stay (SKILLED_NURSING_FACILITY): Payer: Medicaid Other | Admitting: Adult Health

## 2016-03-27 ENCOUNTER — Encounter: Payer: Self-pay | Admitting: Adult Health

## 2016-03-27 DIAGNOSIS — I2692 Saddle embolus of pulmonary artery without acute cor pulmonale: Secondary | ICD-10-CM | POA: Diagnosis not present

## 2016-03-27 DIAGNOSIS — R569 Unspecified convulsions: Secondary | ICD-10-CM

## 2016-03-27 DIAGNOSIS — E1149 Type 2 diabetes mellitus with other diabetic neurological complication: Secondary | ICD-10-CM | POA: Diagnosis not present

## 2016-03-27 DIAGNOSIS — I825Y2 Chronic embolism and thrombosis of unspecified deep veins of left proximal lower extremity: Secondary | ICD-10-CM | POA: Diagnosis not present

## 2016-03-27 DIAGNOSIS — Z931 Gastrostomy status: Secondary | ICD-10-CM | POA: Diagnosis not present

## 2016-03-27 DIAGNOSIS — I609 Nontraumatic subarachnoid hemorrhage, unspecified: Secondary | ICD-10-CM | POA: Diagnosis not present

## 2016-03-27 DIAGNOSIS — I69351 Hemiplegia and hemiparesis following cerebral infarction affecting right dominant side: Secondary | ICD-10-CM | POA: Diagnosis not present

## 2016-03-27 DIAGNOSIS — I10 Essential (primary) hypertension: Secondary | ICD-10-CM

## 2016-03-27 DIAGNOSIS — R1314 Dysphagia, pharyngoesophageal phase: Secondary | ICD-10-CM

## 2016-03-27 NOTE — Progress Notes (Signed)
Location:   [facility] Room Number: 204 A Place of Service:  SNF (31)   CODE STATUS: Full Code  No Known Allergies  Chief Complaint  Patient presents with  . Medical Management of Chronic Issues    Routine Visit    HPI:  She is a long term resident of this facility being seen for the management of her chronic illnesses. Overall there is little change in her status. She is unable to participate in the hpi or ros. She does get out of bed daily. There are no nursing concerns at this time.    Past Medical History:  Diagnosis Date  . Acute respiratory failure (HCC)   . Diabetes mellitus without complication (HCC)    Type 2, W/o complications  . DVT (deep venous thrombosis) (HCC) 04/05/2015  . Dysphagia   . Epilepsy (HCC)   . GERD (gastroesophageal reflux disease)   . Hyperlipidemia   . Hypertension   . IBS (irritable bowel syndrome)   . Nontraumatic subarachnoid hemorrhage (HCC)   . Urinary retention     Past Surgical History:  Procedure Laterality Date  . ABDOMINAL SURGERY    . ANEURYSM COILING    . COLONOSCOPY N/A 02/20/2015   Procedure: COLONOSCOPY;  Surgeon: Iva Boop, MD;  Location: [facility] ENDOSCOPY;  Service: Endoscopy;  Laterality: N/A;  . ESOPHAGOGASTRODUODENOSCOPY (EGD) WITH PROPOFOL N/A 02/02/2015   Procedure: ESOPHAGOGASTRODUODENOSCOPY (EGD) WITH PROPOFOL;  Surgeon: Jimmye Norman, MD;  Location: [facility] ENDOSCOPY;  Service: General;  Laterality: N/A;  . IR GENERIC HISTORICAL  08/26/2015   IR GASTRIC TUBE PERC CHG W/O IMG GUIDE 08/26/2015 Irish Lack, MD [facility]  . IR GENERIC HISTORICAL  09/14/2015   IR REPLC GASTRO/COLONIC TUBE PERCUT W/FLUORO 09/14/2015 Darrell K Allred, PA-C [facility]  . PEG PLACEMENT N/A 02/02/2015   Procedure: PERCUTANEOUS ENDOSCOPIC GASTROSTOMY (PEG) PLACEMENT;  Surgeon: Jimmye Norman, MD;  Location: [facility] ENDOSCOPY;  Service: General;  Laterality: N/A;  . RADIOLOGY WITH ANESTHESIA N/A 01/13/2015   Procedure: RADIOLOGY WITH  ANESTHESIA;  Surgeon: Lisbeth Renshaw, MD;  Location: [facility] OR;  Service: Radiology;  Laterality: N/A;  . TRACHEOSTOMY      Social History   Social History  . Marital status: Single    Spouse name: N/A  . Number of children: N/A  . Years of education: N/A   Occupational History  . Not on file.   Social History Main Topics  . Smoking status: Former Games developer  . Smokeless tobacco: Never Used  . Alcohol use No  . Drug use: No  . Sexual activity: Not on file   Other Topics Concern  . Not on file   Social History Narrative  . No narrative on file   History reviewed. No pertinent family history.    VITAL SIGNS BP 118/74   Pulse 66   Temp 98 F (36.7 C)   Resp 18   Ht 5\' 3"  (1.6 m)   Wt 106 lb 9.6 oz (48.4 kg)   LMP  (LMP Unknown)   SpO2 98%   BMI 18.88 kg/m   Patient's Medications  New Prescriptions   No medications on file  Previous Medications   ACETAMINOPHEN (TYLENOL) 325 MG TABLET    Take 650 mg by mouth every 4 (four) hours as needed for mild pain or fever.   AMLODIPINE (NORVASC) 10 MG TABLET    10 mg by Feeding Tube route daily.   BETHANECHOL (URECHOLINE) 10 MG TABLET    10 mg by PEG Tube route  every 8 (eight) hours.    CLONIDINE (CATAPRES) 0.1 MG TABLET    Take 0.1 mg by mouth 2 (two) times daily.    ENOXAPARIN (LOVENOX) 60 MG/0.6ML INJECTION    Inject 60 mg into the skin every 12 (twelve) hours.   HYDROCORTISONE CREAM 1 %    Apply 1 application topically 3 (three) times daily.   LEVETIRACETAM (KEPPRA) 100 MG/ML SOLUTION    Place 5 mLs (500 mg total) into feeding tube 2 (two) times daily.   METOPROLOL (LOPRESSOR) 100 MG TABLET    Place 100 mg into feeding tube 2 (two) times daily.   METOPROLOL TARTRATE (LOPRESSOR) 25 MG TABLET    Place 25 mg into feeding tube 2 (two) times daily.   MULTIPLE VITAMINS-MINERALS (DECUBI-VITE) CAPS    Take 1 capsule by mouth daily.   NUTRITIONAL SUPPLEMENTS (NUTRITIONAL SUPPLEMENT PO)    Place 60 mLs into feeding tube 3 (three)  times daily. "protein liquid" to promote wound healing    SKIN PROTECTANTS, MISC. (CALAZIME SKIN PROTECTANT EX)    Apply 1 application topically 2 (two) times daily.  Modified Medications   No medications on file  Discontinued Medications   No medications on file     SIGNIFICANT DIAGNOSTIC EXAMS  02-17-15: ct angio of chest: 1. Extensive pulmonary emboli involving distal main pulmonary arteries extending into lobar, segmental and subsegmental sized branches throughout the lungs bilaterally. At this time, there are no overt findings to suggest right heart strain. 2. Cardiomegaly with left ventricular concentric hypertrophy. 3. Dependent atelectasis throughout the lower lobes of the lungs bilaterally.  02-18-15: bilateral lower extremity doppler: Findings consistent with acute deep vein thrombosis involving the left common femoral vein, left proximal profunda femoris vein, and left proximal femoral vein. Incidental findings are consistent with: enlarged lymph node on the right. - No evidence of deep vein thrombosis involving the right lower extremity. - No evidence of Baker&'s cyst on the right or left.  02-19-15: pelvic ultrasound: 1. Uterus not identified, presumed hysterectomy. No mass or free fluid seen within the midline pelvis. 2. Neither ovary is seen, perhaps bilateral oophorectomies, perhaps obscured by the fairly prominent fluid-filled bowel loops in the pelvis. No mass or free fluid seen within either adnexal region.  03-15-15: chest x-ray: 1. Tracheostomy tube is newly absent. No pneumomediastinum or pneumothorax. 2. Stable mild enlargement of the cardiopericardial silhouette. 3. Linear subsegmental atelectasis in the left lower lobe.  03-16-15: chest x-ray: Lungs clear.  Heart prominent but stable   LABS REVIEWED:   03-14-15: wbc 20.1; hgb 10.8; hct 34.2; mcv 90.2; plt 313; glucose 119; bun 13; creat 0.46; k+ 4.1; na++131; liver normal albumin 3.1 03-20-15: wbc 15.1; hgb 10.0; hct  32.0; mcv 92.8; plt 312; stool for c-diff: +  03-21-15: glucose 161; bun 9; creat 0.46; k+ 3.7; na++139  04-18-15: INR 3.4  08-06-15: urine culture; e-coli: ESBL: imipenem   hgb a1c 5.5; urine micro-albumin 144.1 08-13-15: wbc 6.7 hgb 10.6; hct 35.9; mcv 89.8; plt 330; glucose 121; bun 16.7; creat 0.67; k+ 4.0; na++ 143; liver normal albumin 3.5   11-02-15: wbc 8.2; hgb 11.8; hct 37.3; mcv 89.3; plt 220; glucose 109; bun 16.6; creat 0.88; k+ 5.2; na++ 136 11-23-15: wbc 6.3; hgb 13.8; hct 43.9; mcv 96.8; plt 225; glucose 92; bun 27.6; creat 0.86; k+ 4.3; na++ 148      Review of Systems  Unable to perform ROS: patient nonverbal     Physical Exam  Constitutional: No distress.  Eyes: Conjunctivae  are normal.  Neck: Neck supple. No JVD present. No thyromegaly present.  Cardiovascular: Normal rate, regular rhythm and intact distal pulses.   Respiratory: Effort normal and breath sounds normal. No respiratory distress. She has no wheezes.  GI: Soft. Bowel sounds are normal. She exhibits no distension. There is no tenderness.  Has peg tube  Musculoskeletal: She exhibits no edema.  Right hemiparesis   Lymphadenopathy:    She has no cervical adenopathy.  Neurological: She is alert.  Skin: Skin is warm and dry. She is not diaphoretic.  Psychiatric: She has a normal mood and affect.      ASSESSMENT/ PLAN:  1. Diabetes: hgb a1c is 5.5; is currently off medications; will continue to monitor her status.   2. Seizure: no reports of seizure activity present: will continue keppra 500 mg twice daily and will monitor   3. 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  3. Hypertension: will continue lopressor 25 mg twice daily and clonidine 0.1 mg twice daily norvasc 10 mg daily    4.  Dysphagia: no signs of aspiration present  her po intake is not adequate to maintain her body weight; she is dependent upon peg tube feedings.  5. SAH: is neurologically without change; has right hemiparesis; will monitor her status.   6. UTI/urine retention: will continue urecholine 10 mg every 8 hours    7.  Weight loss:  Her weight is 106 pounds. will continue her tube feeding per dietician recommendations. Will continue to monitor her status.      Synthia Innocent NP [facility]  Contact [phone] Monday through Friday 8am- 5pm  After hours call [phone]

## 2016-04-25 ENCOUNTER — Non-Acute Institutional Stay (SKILLED_NURSING_FACILITY): Payer: Medicaid Other | Admitting: Adult Health

## 2016-04-25 ENCOUNTER — Encounter: Payer: Self-pay | Admitting: Adult Health

## 2016-04-25 DIAGNOSIS — I69351 Hemiplegia and hemiparesis following cerebral infarction affecting right dominant side: Secondary | ICD-10-CM | POA: Diagnosis not present

## 2016-04-25 DIAGNOSIS — Z931 Gastrostomy status: Secondary | ICD-10-CM | POA: Diagnosis not present

## 2016-04-25 DIAGNOSIS — I2692 Saddle embolus of pulmonary artery without acute cor pulmonale: Secondary | ICD-10-CM | POA: Diagnosis not present

## 2016-04-25 DIAGNOSIS — I825Y2 Chronic embolism and thrombosis of unspecified deep veins of left proximal lower extremity: Secondary | ICD-10-CM | POA: Diagnosis not present

## 2016-04-25 DIAGNOSIS — R569 Unspecified convulsions: Secondary | ICD-10-CM

## 2016-04-25 DIAGNOSIS — E43 Unspecified severe protein-calorie malnutrition: Secondary | ICD-10-CM | POA: Diagnosis not present

## 2016-04-25 DIAGNOSIS — I609 Nontraumatic subarachnoid hemorrhage, unspecified: Secondary | ICD-10-CM | POA: Diagnosis not present

## 2016-04-25 DIAGNOSIS — E1149 Type 2 diabetes mellitus with other diabetic neurological complication: Secondary | ICD-10-CM | POA: Diagnosis not present

## 2016-04-25 NOTE — Progress Notes (Signed)
Provider:  Wyatt Portela, NP Location:  [facility] Room Number: 204 A Place of Service:  SNF (31)   PCP: Kirt Boys, DO Patient Care Team: Kirt Boys, DO as PCP - General (Internal Medicine) Sharee Holster, NP as Nurse Practitioner (Geriatric Medicine) [facility] (Skilled Nursing Facility)  Extended Emergency Contact Information Primary Emergency Contact: Silas [place] Mobile Phone: [phone] Relation: Son Secondary Emergency Contact: Debroah Ball[place] of [place] Mobile Phone: [phone] Relation: Son  Code Status: Full Code Goals of Care: Advanced Directive information Advanced Directives 04/25/2016  Does Patient Have a Medical Advance Directive? Yes  Type of Advance Directive Out of facility DNR (pink MOST or yellow form)  Does patient want to make changes to medical advance directive? No - Patient declined  Copy of Healthcare Power of Attorney in Chart? -  Would patient like information on creating a medical advance directive? -      No Known Allergies   Chief Complaint  Patient presents with  . Annual Exam    Yearly Exam    HPI: Patient is a 51 y.o. female seen today for an annual comprehensive examination. There is not change in overall status. She has not been hospitalized over the past year. She is unable to participate in the hpi or ros. There are no nursing concerns at this time.   Past Medical History:  Diagnosis Date  . Acute respiratory failure (HCC)   . Diabetes mellitus without complication (HCC)    Type 2, W/o complications  . DVT (deep venous thrombosis) (HCC) 04/05/2015  . Dysphagia   . Epilepsy (HCC)   . GERD (gastroesophageal reflux disease)   . Hyperlipidemia   . Hypertension   . IBS (irritable bowel syndrome)   . Nontraumatic subarachnoid hemorrhage (HCC)   . Urinary retention    Past Surgical History:  Procedure Laterality Date  . ABDOMINAL SURGERY    .  ANEURYSM COILING    . COLONOSCOPY N/A 02/20/2015   Procedure: COLONOSCOPY;  Surgeon: Iva Boop, MD;  Location: [facility] ENDOSCOPY;  Service: Endoscopy;  Laterality: N/A;  . ESOPHAGOGASTRODUODENOSCOPY (EGD) WITH PROPOFOL N/A 02/02/2015   Procedure: ESOPHAGOGASTRODUODENOSCOPY (EGD) WITH PROPOFOL;  Surgeon: Jimmye Norman, MD;  Location: [facility] ENDOSCOPY;  Service: General;  Laterality: N/A;  . IR GENERIC HISTORICAL  08/26/2015   IR GASTRIC TUBE PERC CHG W/O IMG GUIDE 08/26/2015 Irish Lack, MD [facility]INTERV RAD  . IR GENERIC HISTORICAL  09/14/2015   IR REPLC GASTRO/COLONIC TUBE PERCUT W/FLUORO 09/14/2015 Darrell K Allred, PA-C [facility]INTERV RAD  . PEG PLACEMENT N/A 02/02/2015   Procedure: PERCUTANEOUS ENDOSCOPIC GASTROSTOMY (PEG) PLACEMENT;  Surgeon: Jimmye Norman, MD;  Location: [facility] ENDOSCOPY;  Service: General;  Laterality: N/A;  . RADIOLOGY WITH ANESTHESIA N/A 01/13/2015   Procedure: RADIOLOGY WITH ANESTHESIA;  Surgeon: Lisbeth Renshaw, MD;  Location: [facility] OR;  Service: Radiology;  Laterality: N/A;  . TRACHEOSTOMY      reports that she has quit smoking. She has never used smokeless tobacco. She reports that she does not drink alcohol or use drugs. Social History   Social History  . Marital status: Single    Spouse name: N/A  . Number of children: N/A  . Years of education: N/A   Occupational History  . Not on file.   Social History Main Topics  . Smoking status: Former Games developer  . Smokeless tobacco: Never Used  . Alcohol use No  . Drug use: No  . Sexual activity: Not on file  Other Topics Concern  . Not on file   Social History Narrative  . No narrative on file   History reviewed. No pertinent family history.  Vitals:   04/25/16 1228  BP: 132/66  Pulse: 62  Resp: 17  Temp: 98.2 F (36.8 C)  SpO2: 99%  Weight: 106 lb (48.1 kg)  Height: 5\' 3"  (1.6 m)   Body mass index is 18.78 kg/m.    Outpatient Encounter Prescriptions as of 04/25/2016  Medication Sig  . acetaminophen (TYLENOL) 325 MG  tablet Take 650 mg by mouth every 4 (four) hours as needed for mild pain or fever.  . bethanechol (URECHOLINE) 10 MG tablet 10 mg by PEG Tube route every 8 (eight) hours.   . cloNIDine (CATAPRES) 0.1 MG tablet Take 0.1 mg by mouth 2 (two) times daily.   Marland Kitchen. enoxaparin (LOVENOX) 60 MG/0.6ML injection Inject 50 mg into the skin every 12 (twelve) hours.   . hydrocortisone cream 1 % Apply 1 application topically 3 (three) times daily.  Marland Kitchen. levETIRAcetam (KEPPRA) 100 MG/ML solution Place 5 mLs (500 mg total) into feeding tube 2 (two) times daily.  . metoprolol (LOPRESSOR) 100 MG tablet Place 100 mg into feeding tube 2 (two) times daily.  . metoprolol tartrate (LOPRESSOR) 25 MG tablet Place 25 mg into feeding tube 2 (two) times daily.  . Multiple Vitamins-Minerals (DECUBI-VITE) CAPS Take 1 capsule by mouth daily.  . Skin Protectants, Misc. (CALAZIME SKIN PROTECTANT EX) Apply 1 application topically 2 (two) times daily.   No facility-administered encounter medications on file as of 04/25/2016.     SIGNIFICANT DIAGNOSTIC EXAMS 02-17-15: ct angio of chest: 1. Extensive pulmonary emboli involving distal main pulmonary arteries extending into lobar, segmental and subsegmental sized branches throughout the lungs bilaterally. At this time, there are no overt findings to suggest right heart strain. 2. Cardiomegaly with left ventricular concentric hypertrophy. 3. Dependent atelectasis throughout the lower lobes of the lungs bilaterally.  02-18-15: bilateral lower extremity doppler: Findings consistent with acute deep vein thrombosis involving the left common femoral vein, left proximal profunda femoris vein, and left proximal femoral vein. Incidental findings are consistent with: enlarged lymph node on the right. - No evidence of deep vein thrombosis involving the right lower extremity. - No evidence of Baker&'s cyst on the right or left.  02-19-15: pelvic ultrasound: 1. Uterus not identified, presumed hysterectomy.  No mass or free fluid seen within the midline pelvis. 2. Neither ovary is seen, perhaps bilateral oophorectomies, perhaps obscured by the fairly prominent fluid-filled bowel loops in the pelvis. No mass or free fluid seen within either adnexal region.  03-15-15: chest x-ray: 1. Tracheostomy tube is newly absent. No pneumomediastinum or pneumothorax. 2. Stable mild enlargement of the cardiopericardial silhouette. 3. Linear subsegmental atelectasis in the left lower lobe.  03-16-15: chest x-ray: Lungs clear.  Heart prominent but stable   LABS REVIEWED:   04-18-15: INR 3.4  08-06-15: urine culture; e-coli: ESBL: imipenem   hgb a1c 5.5; urine micro-albumin 144.1 08-13-15: wbc 6.7 hgb 10.6; hct 35.9; mcv 89.8; plt 330; glucose 121; bun 16.7; creat 0.67; k+ 4.0; na++ 143; liver normal albumin 3.5   11-02-15: wbc 8.2; hgb 11.8; hct 37.3; mcv 89.3; plt 220; glucose 109; bun 16.6; creat 0.88; k+ 5.2; na++ 136 11-23-15: wbc 6.3; hgb 13.8; hct 43.9; mcv 96.8; plt 225; glucose 92; bun 27.6; creat 0.86; k+ 4.3; na++ 148      Review of Systems  Unable to perform ROS: patient nonverbal  Physical Exam  Constitutional: No distress.  Eyes: Conjunctivae are normal.  Neck: Neck supple. No JVD present. No thyromegaly present.  Cardiovascular: Normal rate, regular rhythm and intact distal pulses.   Respiratory: Effort normal and breath sounds normal. No respiratory distress. She has no wheezes.  GI: Soft. Bowel sounds are normal. She exhibits no distension. There is no tenderness.  Has peg tube  Musculoskeletal: She exhibits no edema.  Right hemiparesis   Lymphadenopathy:    She has no cervical adenopathy.  Neurological: She is alert.  Skin: Skin is warm and dry. She is not diaphoretic.  Psychiatric: She has a normal mood and affect.      ASSESSMENT/ PLAN:  1. Diabetes: hgb a1c is 5.5; is currently off medications; will continue to monitor her status.   2. Seizure: no reports of seizure  activity present: will continue keppra 500 mg twice daily and will monitor   3. 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  3. Hypertension: will continue lopressor 25 mg twice daily and clonidine 0.1 mg twice daily norvasc 10 mg daily   4.  Dysphagia: no signs of aspiration present  her po intake is not adequate to maintain her body weight; she is dependent upon peg tube feedings.  5. SAH: is neurologically without change; has right hemiparesis; will monitor her status.   6. UTI/urine retention: will continue urecholine 10 mg every 8 hours  Doe get I/O cath twice daily   7.  Weight loss:  Her weight is 106 pounds. will continue her tube feeding per dietician recommendations. Will continue to monitor her status. Will have dietary evaluate  for weight loss   Will setup screening mammogram; pap smear; cbc; cmp; hgb a1c urine mirco-albumin fobt    Time spent with patient 45  minutes >50% time spent counseling; reviewing medical record; tests; labs; and developing future plan of care   MD is aware of resident's narcotic use and is in agreement with current plan of care. We will wean dosage as appropriate for resident     Synthia Innocent NP [facility]  Contact [phone] Monday through Friday 8am- 5pm  After hours call [phone]

## 2016-04-26 LAB — BASIC METABOLIC PANEL
BUN: 10 mg/dL (ref 4–21)
Creatinine: 0.7 mg/dL (ref 0.5–1.1)
Glucose: 94 mg/dL
Potassium: 4.2 mmol/L (ref 3.4–5.3)
Sodium: 142 mmol/L (ref 137–147)

## 2016-04-26 LAB — HEPATIC FUNCTION PANEL
ALK PHOS: 85 U/L (ref 25–125)
ALT: 9 U/L (ref 7–35)
AST: 11 U/L — AB (ref 13–35)

## 2016-04-26 LAB — LIPID PANEL
Cholesterol: 177 mg/dL (ref 0–200)
HDL: 33 mg/dL — AB (ref 35–70)
LDL CALC: 124 mg/dL
Triglycerides: 101 mg/dL (ref 40–160)

## 2016-04-26 LAB — CBC AND DIFFERENTIAL
HEMATOCRIT: 38 % (ref 36–46)
Hemoglobin: 12.6 g/dL (ref 12.0–16.0)
Platelets: 231 10*3/uL (ref 150–399)
WBC: 6.9 10^3/mL

## 2016-04-26 LAB — HM HEPATITIS C SCREENING LAB: HM Hepatitis Screen: NEGATIVE

## 2016-05-16 IMAGING — CT CT ANGIO CHEST
1 of 8 series · 17 of 36 positions shown · IV contrast (Iohexol (Omnipaque 350))
Comparison: No priors.

CLINICAL DATA: 49-year-old female with shallow respirations and
shortness of breath. Low oxygen saturations.

EXAM:
CT ANGIOGRAPHY CHEST WITH CONTRAST
TECHNIQUE: Multidetector CT imaging of the chest was performed using the
standard protocol during bolus administration of intravenous
contrast. Multiplanar CT image reconstructions and MIPs were
obtained to evaluate the vascular anatomy.
CONTRAST:  80mL OMNIPAQUE IOHEXOL 350 MG/ML SOLN

[Series 406: thins pacs · axial · 0.58mm/px · z∈[+11,+235]mm · 17 of 252 slices shown]
[im 14/252  lung]
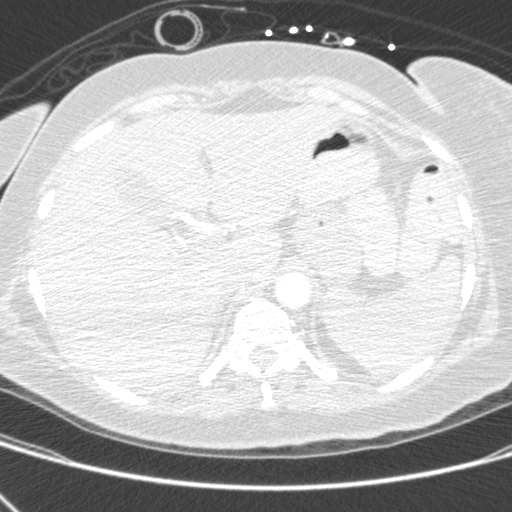
[im 28/252  mediastinal]
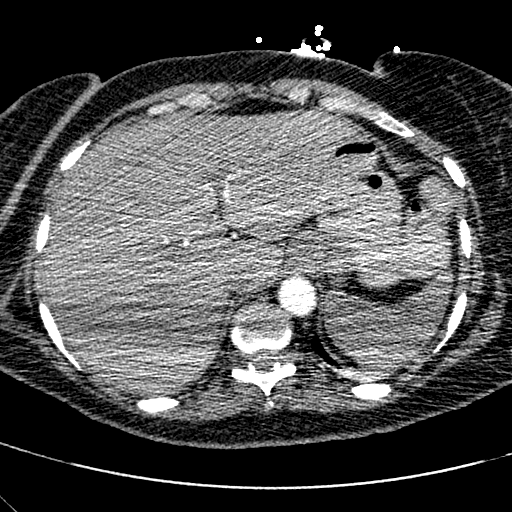
[im 42/252  lung]
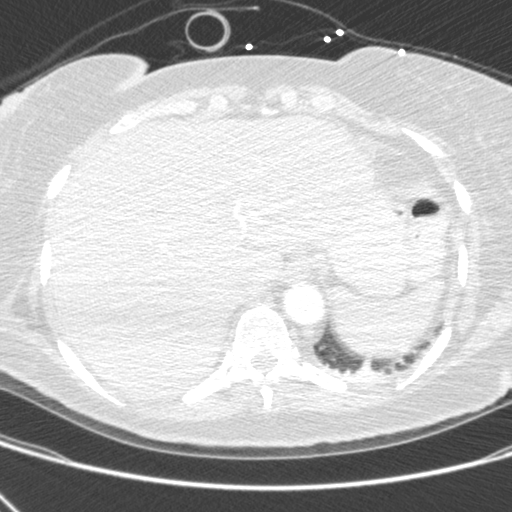
[im 56/252  mediastinal]
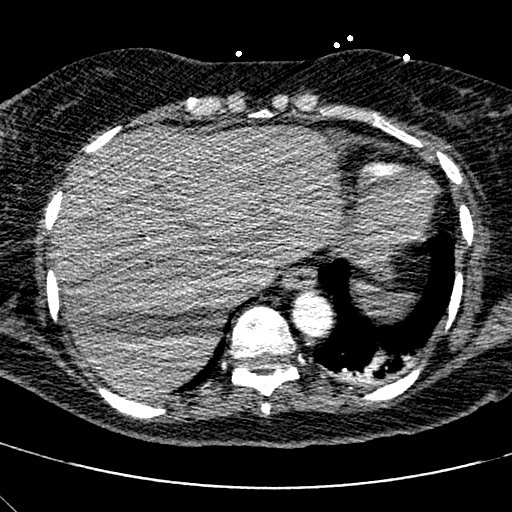
[im 70/252  lung]
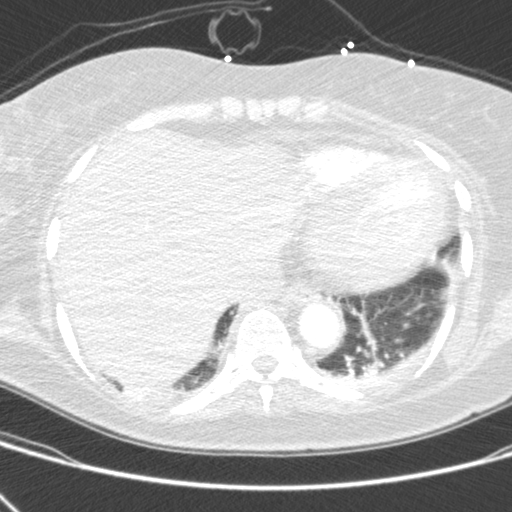
[im 84/252  mediastinal]
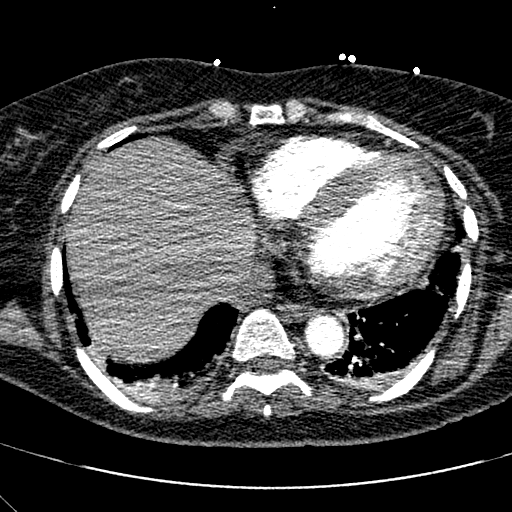
[im 98/252  lung]
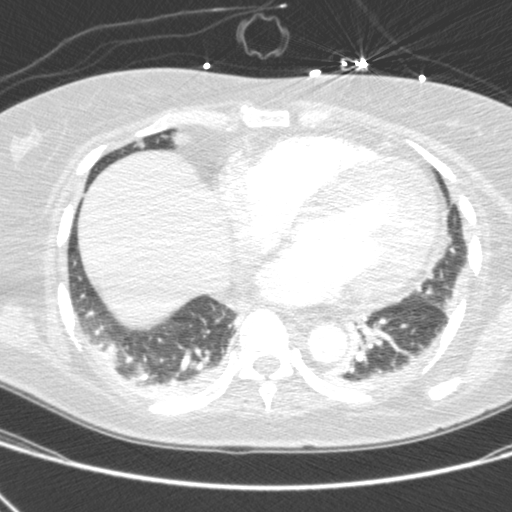
[im 112/252  mediastinal]
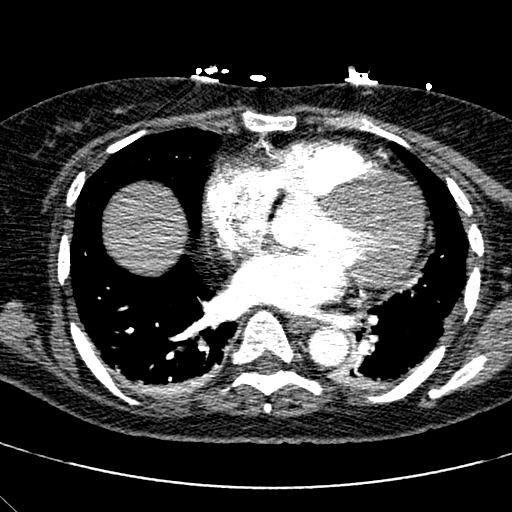
[im 126/252  lung]
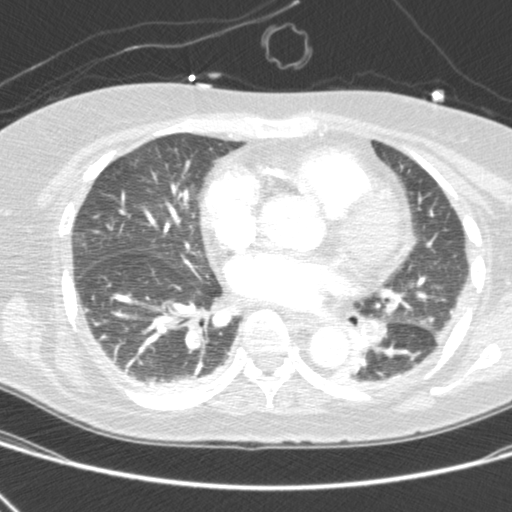
[im 140/252  mediastinal]
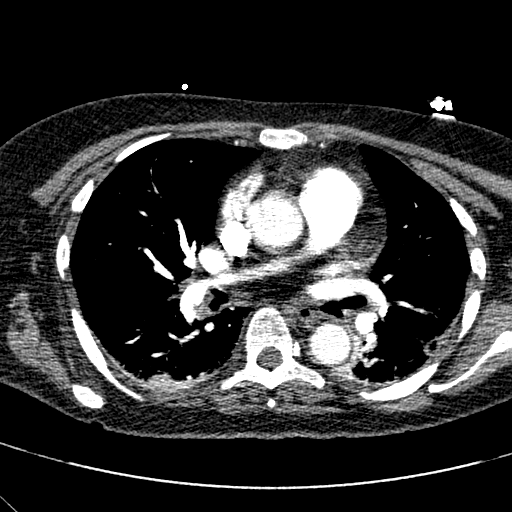
[im 154/252  lung]
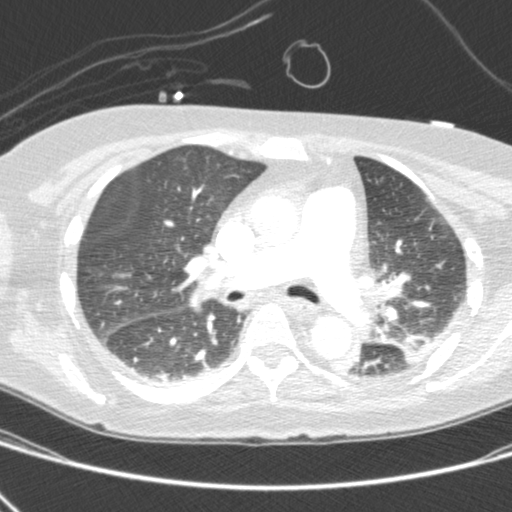
[im 168/252  mediastinal]
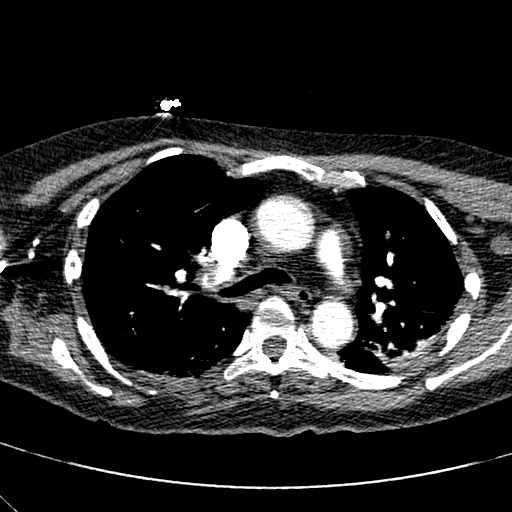
[im 182/252  lung]
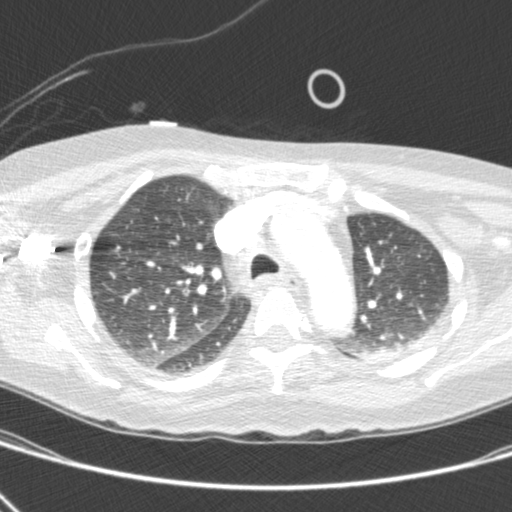
[im 196/252  mediastinal]
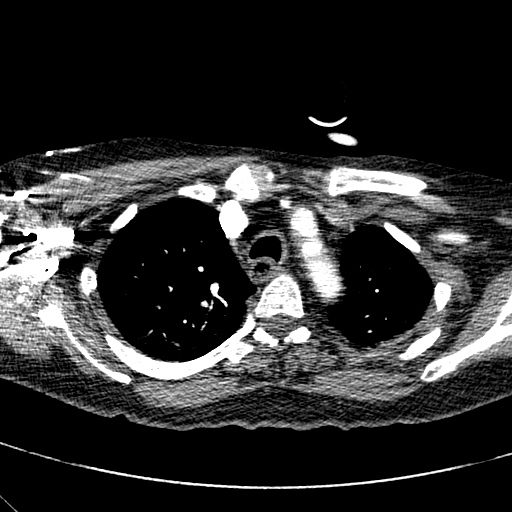
[im 210/252  lung]
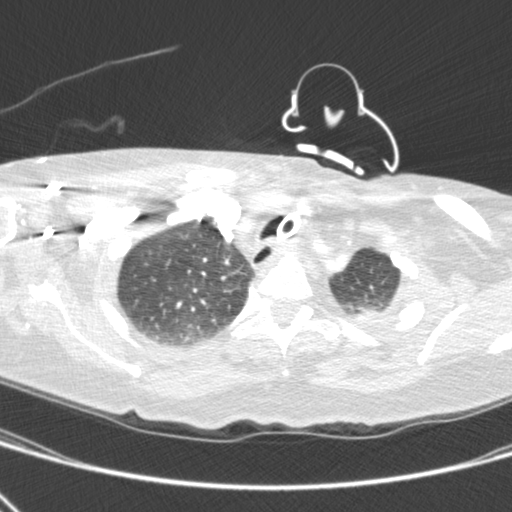
[im 224/252  mediastinal]
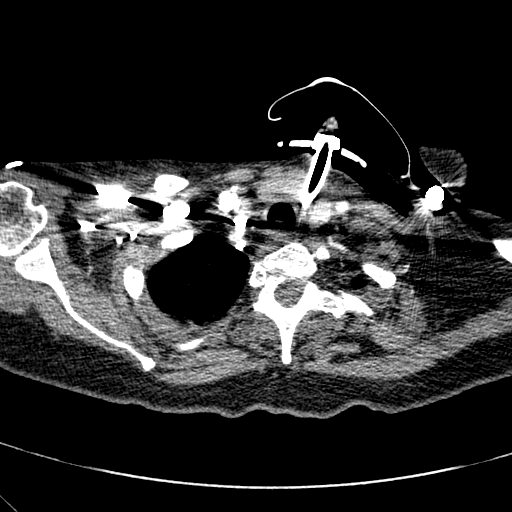
[im 238/252  lung]
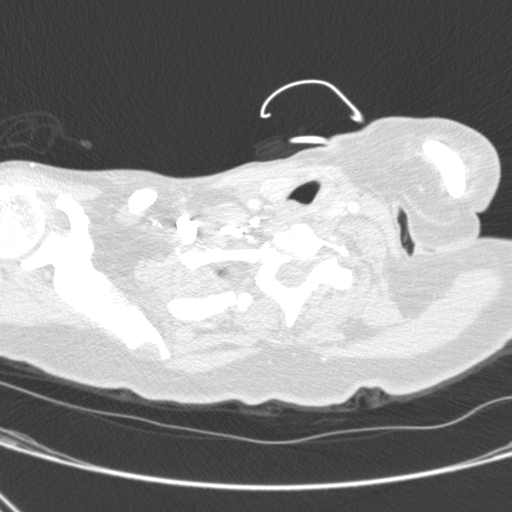

[17 of 36 positions shown; findings below may reference images not displayed]

FINDINGS: Mediastinum/Lymph Nodes: There are filling defects in the distal
main pulmonary arteries bilaterally extending into lobar, segmental
and subsegmental sized branches throughout most of the lungs. No
saddle embolus is identified. Pulmonic trunk measures 2.8 cm in
diameter. Right ventricle measures up to 3.2 cm in diameter. Left
ventricle measures up to 4.1 cm in diameter. Mild cardiomegaly. Left
ventricular concentric hypertrophy. There is no significant
pericardial fluid, thickening or pericardial calcification. There is
atherosclerosis of the thoracic aorta, the great vessels of the
mediastinum and the coronary arteries, including calcified
atherosclerotic plaque in the left anterior descending, left
circumflex and right coronary arteries. No pathologically enlarged
mediastinal or hilar lymph nodes. Esophagus is unremarkable in
appearance. No axillary lymphadenopathy. Tracheostomy tube in
position with tip terminating approximately 4.6 cm above the carina.

Lungs/Pleura: Areas of dependent atelectasis are noted in lower
lobes of the lungs bilaterally. No acute consolidative airspace
disease. No pleural effusions. No definite suspicious appearing
pulmonary nodules or masses.

Upper Abdomen: Unremarkable.

Musculoskeletal/Soft Tissues: There are no aggressive appearing
lytic or blastic lesions noted in the visualized portions of the
skeleton.

Review of the MIP images confirms the above findings.
IMPRESSION: 1. Extensive pulmonary emboli involving distal main pulmonary
arteries extending into lobar, segmental and subsegmental sized
branches throughout the lungs bilaterally. At this time, there are
no overt findings to suggest right heart strain.
2. Cardiomegaly with left ventricular concentric hypertrophy.
3. Dependent atelectasis throughout the lower lobes of the lungs
bilaterally.
Critical Value/emergent results were called by telephone at the time
of interpretation on 02/17/2015 at [DATE] to Dr. DLEKE TIGER, who
verbally acknowledged these results.

## 2016-05-18 ENCOUNTER — Encounter: Payer: Self-pay | Admitting: Adult Health

## 2016-05-18 ENCOUNTER — Non-Acute Institutional Stay (SKILLED_NURSING_FACILITY): Payer: Medicaid Other | Admitting: Adult Health

## 2016-05-18 DIAGNOSIS — R569 Unspecified convulsions: Secondary | ICD-10-CM

## 2016-05-18 DIAGNOSIS — I609 Nontraumatic subarachnoid hemorrhage, unspecified: Secondary | ICD-10-CM

## 2016-05-18 DIAGNOSIS — I825Y2 Chronic embolism and thrombosis of unspecified deep veins of left proximal lower extremity: Secondary | ICD-10-CM | POA: Diagnosis not present

## 2016-05-18 DIAGNOSIS — R1314 Dysphagia, pharyngoesophageal phase: Secondary | ICD-10-CM | POA: Diagnosis not present

## 2016-05-18 DIAGNOSIS — E1149 Type 2 diabetes mellitus with other diabetic neurological complication: Secondary | ICD-10-CM | POA: Diagnosis not present

## 2016-05-18 NOTE — Progress Notes (Signed)
Location:   [facility] Room Number: 204 A Place of Service:  SNF (31)   CODE STATUS: Full code  No Known Allergies  Chief Complaint  Patient presents with  . Medical Management of Chronic Issues    1 month follow up    HPI:  She is a long term resident of this facility being seen for the management of her chronic illnesses. She remains without significant change. She is unable to participate in the hpi or ros due to aphasia. She does get out of bed daily. There are no nursing concerns at this time.    Past Medical History:  Diagnosis Date  . Acute respiratory failure (HCC)   . Diabetes mellitus without complication (HCC)    Type 2, W/o complications  . DVT (deep venous thrombosis) (HCC) 04/05/2015  . Dysphagia   . Epilepsy (HCC)   . GERD (gastroesophageal reflux disease)   . Hyperlipidemia   . Hypertension   . IBS (irritable bowel syndrome)   . Nontraumatic subarachnoid hemorrhage (HCC)   . Urinary retention     Past Surgical History:  Procedure Laterality Date  . ABDOMINAL SURGERY    . ANEURYSM COILING    . COLONOSCOPY N/A 02/20/2015   Procedure: COLONOSCOPY;  Surgeon: Iva Boop, MD;  Location: [facility] ENDOSCOPY;  Service: Endoscopy;  Laterality: N/A;  . ESOPHAGOGASTRODUODENOSCOPY (EGD) WITH PROPOFOL N/A 02/02/2015   Procedure: ESOPHAGOGASTRODUODENOSCOPY (EGD) WITH PROPOFOL;  Surgeon: Jimmye Norman, MD;  Location: [facility] ENDOSCOPY;  Service: General;  Laterality: N/A;  . IR GENERIC HISTORICAL  08/26/2015   IR GASTRIC TUBE PERC CHG W/O IMG GUIDE 08/26/2015 Irish Lack, MD [facility]INTER[facility]  . IR GENERIC HISTORICAL  09/14/2015   IR REPLC GASTRO/COLONIC TUBE PERCUT W/FLUORO 09/14/2015 Darrell K Allred, PA-C [facility]  . PEG PLACEMENT N/A 02/02/2015   Procedure: PERCUTANEOUS ENDOSCOPIC GASTROSTOMY (PEG) PLACEMENT;  Surgeon: Jimmye Norman, MD;  Location: [facility] ENDOSCOPY;  Service: General;  Laterality: N/A;  . RADIOLOGY WITH ANESTHESIA N/A 01/13/2015   Procedure: RADIOLOGY  WITH ANESTHESIA;  Surgeon: Lisbeth Renshaw, MD;  Location: [facility] OR;  Service: Radiology;  Laterality: N/A;  . TRACHEOSTOMY      Social History   Social History  . Marital status: Single    Spouse name: N/A  . Number of children: N/A  . Years of education: N/A   Occupational History  . Not on file.   Social History Main Topics  . Smoking status: Former Games developer  . Smokeless tobacco: Never Used  . Alcohol use No  . Drug use: No  . Sexual activity: Not on file   Other Topics Concern  . Not on file   Social History Narrative  . No narrative on file   History reviewed. No pertinent family history.    VITAL SIGNS BP 116/65   Pulse 61   Temp 97.6 F (36.4 C)   Resp 16   Ht  (1.6 m)   Wt 104 lb 9.6 oz (47.4 kg)   LMP  (LMP Unknown)   SpO2 98%   BMI 18.53 kg/m   Patient's Medications  New Prescriptions   No medications on file  Previous Medications   ACETAMINOPHEN (TYLENOL) 325 MG TABLET    Take 650 mg by mouth every 4 (four) hours as needed for mild pain or fever.   AMINO ACIDS-PROTEIN HYDROLYS (FEEDING SUPPLEMENT, PRO-STAT SUGAR FREE 64,) LIQD    Take 60 mLs by mouth 3 (three) times daily with meals.   AMLODIPINE (NORVASC) 5  MG TABLET    Take 5 mg by mouth daily.   BETHANECHOL (URECHOLINE) 10 MG TABLET    10 mg by PEG Tube route every 8 (eight) hours.    CLONIDINE (CATAPRES) 0.1 MG TABLET    Take 0.1 mg by mouth 2 (two) times daily.    ENOXAPARIN (LOVENOX) 60 MG/0.6ML INJECTION    Inject 50 mg into the skin every 12 (twelve) hours.    HYDROCORTISONE CREAM 1 %    Apply 1 application topically 3 (three) times daily.   LEVETIRACETAM (KEPPRA) 100 MG/ML SOLUTION    Place 5 mLs (500 mg total) into feeding tube 2 (two) times daily.   METOPROLOL (LOPRESSOR) 100 MG TABLET    Place 100 mg into feeding tube 2 (two) times daily.   METOPROLOL TARTRATE (LOPRESSOR) 25 MG TABLET    Place 25 mg into feeding tube 2 (two) times daily.   MULTIPLE VITAMINS-MINERALS (DECUBI-VITE)  CAPS    Take 1 capsule by mouth daily.   SKIN PROTECTANTS, MISC. (CALAZIME SKIN PROTECTANT EX)    Apply 1 application topically 2 (two) times daily.   UNABLE TO FIND    HSG Puree diet - HSG Puree texture, Regular consistency, may have pleasure finger foods with supervision  Modified Medications   No medications on file  Discontinued Medications   AMLODIPINE (NORVASC) 10 MG TABLET    10 mg by Feeding Tube route daily.   NUTRITIONAL SUPPLEMENTS (NUTRITIONAL SUPPLEMENT PO)    Place 60 mLs into feeding tube 3 (three) times daily. "protein liquid" to promote wound healing      SIGNIFICANT DIAGNOSTIC EXAMS  02-17-15: ct angio of chest: 1. Extensive pulmonary emboli involving distal main pulmonary arteries extending into lobar, segmental and subsegmental sized branches throughout the lungs bilaterally. At this time, there are no overt findings to suggest right heart strain. 2. Cardiomegaly with left ventricular concentric hypertrophy. 3. Dependent atelectasis throughout the lower lobes of the lungs bilaterally.  02-18-15: bilateral lower extremity doppler: Findings consistent with acute deep vein thrombosis involving the left common femoral vein, left proximal profunda femoris vein, and left proximal femoral vein. Incidental findings are consistent with: enlarged lymph node on the right. - No evidence of deep vein thrombosis involving the right lower extremity. - No evidence of Baker&'s cyst on the right or left.  02-19-15: pelvic ultrasound: 1. Uterus not identified, presumed hysterectomy. No mass or free fluid seen within the midline pelvis. 2. Neither ovary is seen, perhaps bilateral oophorectomies, perhaps obscured by the fairly prominent fluid-filled bowel loops in the pelvis. No mass or free fluid seen within either adnexal region.  03-15-15: chest x-ray: 1. Tracheostomy tube is newly absent. No pneumomediastinum or pneumothorax. 2. Stable mild enlargement of the cardiopericardial silhouette. 3.  Linear subsegmental atelectasis in the left lower lobe.  03-16-15: chest x-ray: Lungs clear.  Heart prominent but stable   LABS REVIEWED:   08-06-15: urine culture; e-coli: ESBL: imipenem   hgb a1c 5.5; urine micro-albumin 144.1 08-13-15: wbc 6.7 hgb 10.6; hct 35.9; mcv 89.8; plt 330; glucose 121; bun 16.7; creat 0.67; k+ 4.0; na++ 143; liver normal albumin 3.5   11-02-15: wbc 8.2; hgb 11.8; hct 37.3; mcv 89.3; plt 220; glucose 109; bun 16.6; creat 0.88; k+ 5.2; na++ 136 11-23-15: wbc 6.3; hgb 13.8; hct 43.9; mcv 96.8; plt 225; glucose 92; bun 27.6; creat 0.86; k+ 4.3; na++ 148  04-26-16: wbc 6.9; hgb 12.;6 hct 38.0; mcv 94.4; plt 231; glucose 94; bun 10.0; creat 0.65; k+ 4.2;  na++ 142; liver normal albumin 4.2; chol 177; lld 124; trig 101; hdl 33     Review of Systems  Unable to perform ROS: patient nonverbal     Physical Exam  Constitutional: No distress.  Eyes: Conjunctivae are normal.  Neck: Neck supple. No JVD present. No thyromegaly present.  Cardiovascular: Normal rate, regular rhythm and intact distal pulses.   Respiratory: Effort normal and breath sounds normal. No respiratory distress. She has no wheezes.  GI: Soft. Bowel sounds are normal. She exhibits no distension. There is no tenderness.  Has peg tube  Musculoskeletal: She exhibits no edema.  Right hemiparesis   Lymphadenopathy:    She has no cervical adenopathy.  Neurological: She is alert.  Skin: Skin is warm and dry. She is not diaphoretic.  Psychiatric: She has a normal mood and affect.      ASSESSMENT/ PLAN:  1. Diabetes: hgb a1c is 5.5; is currently off medications; will continue to monitor her status.   2. Seizure: no reports of seizure activity present: will continue keppra 500 mg twice daily and will monitor   3. DVT/PE: she does require long term anticoagulation therapy; will continue lovenox 50  mg twice daily (weigt based); is not a candidate for xarelto; or eliquis due to her history of GI bleed; she  is not appropriate for coumadin therapy; unable  to obtain adequate INR.  3. Hypertension: will continue lopressor 125 mg twice daily and clonidine 0.1 mg twice daily norvasc 5 mg daily   4.  Dysphagia: no signs of aspiration present  her po intake is not adequate to maintain her body weight; she is dependent upon peg tube feedings.  5. SAH: is neurologically without change; has right hemiparesis; will monitor her status.   6. UTI/urine retention: will continue urecholine 10 mg every 8 hours  Does get I/O cath twice daily   7.  Weight loss:  Her weight is 104 pounds. will continue her tube feeding per dietician recommendations. Will continue to monitor her status. Will have dietary evaluate  for weight loss      Synthia Innocent NP [facility]  Contact [phone] Monday through Friday 8am- 5pm  After hours call [phone]

## 2016-06-03 ENCOUNTER — Encounter: Payer: Self-pay | Admitting: Adult Health

## 2016-06-06 ENCOUNTER — Encounter: Payer: Self-pay | Admitting: Internal Medicine

## 2016-06-08 ENCOUNTER — Encounter (HOSPITAL_COMMUNITY): Payer: Self-pay | Admitting: Emergency Medicine

## 2016-06-08 ENCOUNTER — Emergency Department (HOSPITAL_COMMUNITY): Payer: Medicaid Other

## 2016-06-08 ENCOUNTER — Emergency Department (HOSPITAL_COMMUNITY)
Admission: EM | Admit: 2016-06-08 | Discharge: 2016-06-08 | Disposition: A | Discharge status: Home / Self Care | Payer: Medicaid Other | Attending: Emergency Medicine | Admitting: Emergency Medicine

## 2016-06-08 DIAGNOSIS — I619 Nontraumatic intracerebral hemorrhage, unspecified: Secondary | ICD-10-CM | POA: Diagnosis not present

## 2016-06-08 DIAGNOSIS — Z87891 Personal history of nicotine dependence: Secondary | ICD-10-CM | POA: Insufficient documentation

## 2016-06-08 DIAGNOSIS — I10 Essential (primary) hypertension: Secondary | ICD-10-CM | POA: Insufficient documentation

## 2016-06-08 DIAGNOSIS — K9423 Gastrostomy malfunction: Secondary | ICD-10-CM | POA: Diagnosis present

## 2016-06-08 DIAGNOSIS — Z79899 Other long term (current) drug therapy: Secondary | ICD-10-CM | POA: Diagnosis not present

## 2016-06-08 DIAGNOSIS — E119 Type 2 diabetes mellitus without complications: Secondary | ICD-10-CM | POA: Insufficient documentation

## 2016-06-08 HISTORY — PX: IR REPLACE G-TUBE SIMPLE WO FLUORO: IMG2323

## 2016-06-08 NOTE — ED Notes (Addendum)
Dr Rubin PayorPickering was unable to place G tube back into place. Will consult with IR for placement

## 2016-06-08 NOTE — ED Notes (Signed)
Bed: [id number] Expected date:  Expected time:  Means of arrival:  Comments: 51 yo feeding tube pulled out

## 2016-06-08 NOTE — Discharge Instructions (Signed)
PEG tube has been replaced. It can be used.

## 2016-06-08 NOTE — ED Provider Notes (Signed)
[facility] Provider Note   CSN: [id number] Arrival date & time: 06/08/16  0901     History   Chief Complaint Chief Complaint  Patient presents with  . feeding tube pulled out    HPI Rachel Vang is a 51 y.o. female.  HPI Patient presents from nursing home. PEG tube came out this morning. Unsure if it came out this morning or last night. Slight pain at the site but otherwise feels well. She does eat some orally per the patient.   Past Medical History:  Diagnosis Date  . Acute pulmonary embolism (HCC) 02/19/2015  . Acute respiratory failure (HCC)   . Diabetes mellitus without complication (HCC)    Type 2, W/o complications  . DVT (deep venous thrombosis) (HCC) 04/05/2015  . Dysphagia   . Epilepsy (HCC)   . GERD (gastroesophageal reflux disease)   . Hyperlipidemia   . Hypertension   . IBS (irritable bowel syndrome)   . Nontraumatic subarachnoid hemorrhage (HCC)   . SAH (subarachnoid hemorrhage) (HCC)   . Urinary retention     Patient Active Problem List   Diagnosis Date Noted  . Hemiparesis affecting right side as late effect of stroke (HCC) 09/25/2015  . Type II diabetes mellitus with neurological manifestations (HCC) 05/20/2015  . Essential hypertension, benign 05/20/2015  . S/P percutaneous endoscopic gastrostomy (PEG) tube placement (HCC) 05/20/2015  . DVT (deep venous thrombosis) (HCC) 04/05/2015  . Protein-calorie malnutrition, severe (HCC) 03/25/2015  . Acute pulmonary embolism (HCC) 02/19/2015  . Dysphagia 02/18/2015  . Seizures (HCC) 02/18/2015  . History of ETT   . Subarachnoid hemorrhage (HCC)   . SAH (subarachnoid hemorrhage) (HCC)     Past Surgical History:  Procedure Laterality Date  . ABDOMINAL SURGERY    . ANEURYSM COILING    . COLONOSCOPY N/A 02/20/2015   Procedure: COLONOSCOPY;  Surgeon: Iva Booparl E Gessner, MD;  [facility];  Service: Endoscopy;  Laterality: N/A;  . ESOPHAGOGASTRODUODENOSCOPY (EGD) WITH PROPOFOL N/A  02/02/2015   Procedure: ESOPHAGOGASTRODUODENOSCOPY (EGD) WITH PROPOFOL;  Surgeon: Jimmye NormanJames Wyatt, MD;  [facility];  Service: General;  Laterality: N/A;  . IR GASTRIC TUBE PERC CHG W/O IMG GUIDE  06/08/2016  . IR GENERIC HISTORICAL  08/26/2015   IR GASTRIC TUBE PERC CHG W/O IMG GUIDE 08/26/2015 Irish LackGlenn Yamagata, MD [facility]-INTERV RAD  . IR GENERIC HISTORICAL  09/14/2015   IR REPLC GASTRO/COLONIC TUBE PERCUT W/FLUORO 09/14/2015 Darrell K Allred, PA-C [facility]-INTERV RAD  . PEG PLACEMENT N/A 02/02/2015   Procedure: PERCUTANEOUS ENDOSCOPIC GASTROSTOMY (PEG) PLACEMENT;  Surgeon: Jimmye NormanJames Wyatt, MD;  [facility];  Service: General;  Laterality: N/A;  . RADIOLOGY WITH ANESTHESIA N/A 01/13/2015   Procedure: RADIOLOGY WITH ANESTHESIA;  Surgeon: Lisbeth RenshawNeelesh Nundkumar, MD;  Location: [facility] OR;  Service: Radiology;  Laterality: N/A;  . TRACHEOSTOMY      OB History    No data available       Home Medications    Prior to Admission medications   Medication Sig Start Date End Date Taking? Authorizing Provider  acetaminophen (TYLENOL) 325 MG tablet Take 650 mg by mouth every 4 (four) hours as needed for mild pain or fever.   Yes [provider]  Amino Acids-Protein Hydrolys (FEEDING SUPPLEMENT, PRO-STAT SUGAR FREE 64,) LIQD Take 60 mLs by mouth 3 (three) times daily with meals.   Yes [provider]  amLODipine (NORVASC) 5 MG tablet Take 5 mg by mouth daily.   Yes [provider]  bethanechol (URECHOLINE) 10 MG tablet  10 mg by PEG Tube route every 8 (eight) hours.    Yes [provider]  cloNIDine (CATAPRES) 0.1 MG tablet Take 0.1 mg by mouth 2 (two) times daily.    Yes [provider]  enoxaparin (LOVENOX) 60 MG/0.6ML injection Inject 50 mg into the skin 2 (two) times daily.    Yes [provider]  hydrocortisone cream 1 % Apply 1 application topically 3 (three) times daily. Apply to abdomen for rash   Yes [provider]  levETIRAcetam (KEPPRA) 100  MG/ML solution Place 5 mLs (500 mg total) into feeding tube 2 (two) times daily. 02/11/15  Yes Leroy Sea, MD  metoprolol (LOPRESSOR) 100 MG tablet Place 100 mg into feeding tube 2 (two) times daily.   Yes [provider]  metoprolol tartrate (LOPRESSOR) 25 MG tablet Place 25 mg into feeding tube 2 (two) times daily.   Yes [provider]  Multiple Vitamins-Minerals (DECUBI-VITE) CAPS Take 1 capsule by mouth daily.   Yes [provider]  Nutritional Supplements (TWOCAL HN) LIQD Place 250 mLs into feeding tube 3 (three) times daily.   Yes [provider]  Nutritional Supplements (TWOCAL HN) LIQD Take 60 mLs by mouth See admin instructions. Give 72ml/hr every 10 hours at night   Yes [provider]  Skin Protectants, Misc. (CALAZIME SKIN PROTECTANT EX) Apply 1 application topically 2 (two) times daily. Apply to buttocks and left groin for irritation   Yes [provider]  sodium chloride 0.9 % injection Inject 150 mLs into the vein every 4 (four) hours.   Yes [provider]  UNABLE TO FIND HSG Puree diet - HSG Puree texture, Regular consistency, may have pleasure finger foods with supervision   Yes [provider]  WATER FOR INJECTION STERILE IJ Place 20 mLs into feeding tube every hour.   Yes [provider]    Family History No family history on file.  Social History Social History  Substance Use Topics  . Smoking status: Former Games developer  . Smokeless tobacco: Never Used  . Alcohol use No     Allergies   Patient has no known allergies.   Review of Systems Review of Systems  Constitutional: Negative for chills and fever.  Gastrointestinal: Negative for abdominal pain.  Genitourinary: Negative for flank pain.     Physical Exam Updated Vital Signs BP 116/79 (BP Location: Left Arm)   Pulse 67   Temp 97.8 F (36.6 C) (Oral)   Resp 16   LMP  (LMP Unknown)   SpO2 100%   Physical Exam    Constitutional: She appears well-developed.  HENT:  Head: Normocephalic.  Patient is grinding her teeth.  Cardiovascular: Normal rate.   Pulmonary/Chest: Effort normal.  Abdominal:  Left upper quadrant stoma for PEG tube. Slight abdominal tenderness.  Neurological: She is alert.  Patient awake and pleasant.  Skin: Capillary refill takes less than 2 seconds.     ED Treatments / Results  Labs (all labs ordered are listed, but only abnormal results are displayed) Labs Reviewed - No data to display  EKG  EKG Interpretation None       Radiology No results found.  Procedures Procedures (including critical care time)  Medications Ordered in ED Medications - No data to display   Initial Impression / Assessment and Plan / ED Course  I have reviewed the triage vital signs and the nursing notes.  Pertinent labs & imaging results that were available during my care of the  patient were reviewed by me and considered in my medical decision making (see chart for details).     Patient is a nursing home resident after intracranial hemorrhage. PEG tube has come out. Unable to replace. Will discuss with IR about replacing.  Ir has replaced. D/c  Final Clinical Impressions(s) / ED Diagnoses   Final diagnoses:  PEG tube malfunction (HCC)  ICH (intracerebral hemorrhage) [facility])    New Prescriptions Discharge Medication List as of 06/08/2016 11:49 AM       Benjiman Core, MD 06/11/16 [phone]

## 2016-06-08 NOTE — ED Notes (Signed)
[facility] called to transport pt back to [facility] 

## 2016-06-08 NOTE — Procedures (Signed)
66F balloon-retention gastrostomy tube replaced at bedside with no complications. No EBL Flushed well with gastric contents pulled back Ready for immediate use  Rachel Vang 12:04 PM 06/08/2016

## 2016-06-08 NOTE — ED Triage Notes (Signed)
Pt from [facility] via EMS. Per EMS, pt reports that her feeding tube came out this am. Pt is A&O and in NAD. Pt denies pain.

## 2016-06-13 ENCOUNTER — Encounter: Payer: Self-pay | Admitting: Adult Health

## 2016-06-13 ENCOUNTER — Non-Acute Institutional Stay (SKILLED_NURSING_FACILITY): Payer: Medicaid Other | Admitting: Adult Health

## 2016-06-13 DIAGNOSIS — R634 Abnormal weight loss: Secondary | ICD-10-CM

## 2016-06-13 NOTE — Progress Notes (Signed)
Location:   [facility] Room Number: 204 A Place of Service:  SNF (31)   CODE STATUS: Full Code  No Known Allergies  Chief Complaint  Patient presents with  . Acute Visit    Weight Loss    HPI:  I have been asked to see her for her weight loss. Her weight in Jan 2018: 113 pounds her weight today is 102 pounds. She is tube fed for her nutritional support. She has had n/v during the night and her tube feeding was held. She does tell me that she has pain present.    Past Medical History:  Diagnosis Date  . Acute pulmonary embolism (HCC) 02/19/2015  . Acute respiratory failure (HCC)   . Diabetes mellitus without complication (HCC)    Type 2, W/o complications  . DVT (deep venous thrombosis) (HCC) 04/05/2015  . Dysphagia   . Epilepsy (HCC)   . GERD (gastroesophageal reflux disease)   . Hyperlipidemia   . Hypertension   . IBS (irritable bowel syndrome)   . Nontraumatic subarachnoid hemorrhage (HCC)   . SAH (subarachnoid hemorrhage) (HCC)   . Urinary retention     Past Surgical History:  Procedure Laterality Date  . ABDOMINAL SURGERY    . ANEURYSM COILING    . COLONOSCOPY N/A 02/20/2015   Procedure: COLONOSCOPY;  Surgeon: Iva Boop, MD;  Location: [facility] ENDOSCOPY;  Service: Endoscopy;  Laterality: N/A;  . ESOPHAGOGASTRODUODENOSCOPY (EGD) WITH PROPOFOL N/A 02/02/2015   Procedure: ESOPHAGOGASTRODUODENOSCOPY (EGD) WITH PROPOFOL;  Surgeon: Jimmye Norman, MD;  Location[facility] ENDOSCOPY;  Service: General;  Laterality: N/A;  . IR GASTRIC TUBE PERC CHG W/O IMG GUIDE  06/08/2016  . IR GENERIC HISTORICAL  08/26/2015   IR GASTRIC TUBE PERC CHG W/O IMG GUIDE 08/26/2015 Irish Lack, MD [facility]  . IR GENERIC HISTORICAL  09/14/2015   IR REPLC GASTRO/COLONIC TUBE PERCUT W/FLUORO 09/14/2015 Darrell K Allred, PA-C [facility]  . PEG PLACEMENT N/A 02/02/2015   Procedure: PERCUTANEOUS ENDOSCOPIC GASTROSTOMY (PEG) PLACEMENT;  Surgeon: Jimmye Norman, MD;  Location: [facility] ENDOSCOPY;   Service: General;  Laterality: N/A;  . RADIOLOGY WITH ANESTHESIA N/A 01/13/2015   Procedure: RADIOLOGY WITH ANESTHESIA;  Surgeon: Lisbeth Renshaw, MD;  Location: [facility] OR;  Service: Radiology;  Laterality: N/A;  . TRACHEOSTOMY      Social History   Social History  . Marital status: Single    Spouse name: N/A  . Number of children: N/A  . Years of education: N/A   Occupational History  . Not on file.   Social History Main Topics  . Smoking status: Former Games developer  . Smokeless tobacco: Never Used  . Alcohol use No  . Drug use: No  . Sexual activity: Not on file   Other Topics Concern  . Not on file   Social History Narrative  . No narrative on file   History reviewed. No pertinent family history.    VITAL SIGNS BP 110/74   Pulse (!) 102   Temp 98.2 F (36.8 C)   Resp 18   Ht 5\' 3"  (1.6 m)   Wt 102 lb (46.3 kg)   LMP  (LMP Unknown)   SpO2 98%   BMI 18.07 kg/m   Patient's Medications  New Prescriptions   No medications on file  Previous Medications   ACETAMINOPHEN (TYLENOL) 325 MG TABLET    Take 650 mg by mouth every 4 (four) hours as needed for mild pain or fever.   AMINO ACIDS-PROTEIN HYDROLYS (FEEDING SUPPLEMENT, PRO-STAT  SUGAR FREE 64,) LIQD    Take 60 mLs by mouth 3 (three) times daily with meals.   AMLODIPINE (NORVASC) 5 MG TABLET    Take 5 mg by mouth daily.   BETHANECHOL (URECHOLINE) 10 MG TABLET    10 mg by PEG Tube route every 8 (eight) hours.    CLONIDINE (CATAPRES) 0.1 MG TABLET    Take 0.1 mg by mouth 2 (two) times daily.    ENOXAPARIN (LOVENOX) 60 MG/0.6ML INJECTION    Inject 50 mg into the skin 2 (two) times daily.    HYDROCORTISONE CREAM 1 %    Apply 1 application topically 3 (three) times daily. Apply to abdomen for rash   LEVETIRACETAM (KEPPRA) 100 MG/ML SOLUTION    Place 5 mLs (500 mg total) into feeding tube 2 (two) times daily.   METOPROLOL (LOPRESSOR) 100 MG TABLET    Place 100 mg into feeding tube 2 (two) times daily.   METOPROLOL TARTRATE  (LOPRESSOR) 25 MG TABLET    Place 25 mg into feeding tube 2 (two) times daily.   MULTIPLE VITAMINS-MINERALS (DECUBI-VITE) CAPS    Take 1 capsule by mouth daily.   NUTRITIONAL SUPPLEMENTS (FEEDING SUPPLEMENT, JEVITY 1.2 CAL,) LIQD    Give 5580ml/hr x 16 hours from 4:00 PM to 8:00 AM   SKIN PROTECTANTS, MISC. (CALAZIME SKIN PROTECTANT EX)    Apply 1 application topically 2 (two) times daily. Apply to buttocks and left groin for irritation   SODIUM CHLORIDE 0.9 % INJECTION    Inject 150 mLs into the vein every 4 (four) hours.   UNABLE TO FIND    HSG Puree diet - HSG Puree texture, Regular consistency, may have pleasure finger foods with supervision   WATER FOR INJECTION STERILE IJ    Place 20 mLs into feeding tube every hour.  Modified Medications   No medications on file  Discontinued Medications   NUTRITIONAL SUPPLEMENTS (TWOCAL HN) LIQD    Place 250 mLs into feeding tube 3 (three) times daily.   NUTRITIONAL SUPPLEMENTS (TWOCAL HN) LIQD    Take 60 mLs by mouth See admin instructions. Give 3460ml/hr every 10 hours at night     SIGNIFICANT DIAGNOSTIC EXAMS  02-17-15: ct angio of chest: 1. Extensive pulmonary emboli involving distal main pulmonary arteries extending into lobar, segmental and subsegmental sized branches throughout the lungs bilaterally. At this time, there are no overt findings to suggest right heart strain. 2. Cardiomegaly with left ventricular concentric hypertrophy. 3. Dependent atelectasis throughout the lower lobes of the lungs bilaterally.  02-18-15: bilateral lower extremity doppler: Findings consistent with acute deep vein thrombosis involving the left common femoral vein, left proximal profunda femoris vein, and left proximal femoral vein. Incidental findings are consistent with: enlarged lymph node on the right. - No evidence of deep vein thrombosis involving the right lower extremity. - No evidence of Baker&'s cyst on the right or left.  02-19-15: pelvic ultrasound: 1. Uterus  not identified, presumed hysterectomy. No mass or free fluid seen within the midline pelvis. 2. Neither ovary is seen, perhaps bilateral oophorectomies, perhaps obscured by the fairly prominent fluid-filled bowel loops in the pelvis. No mass or free fluid seen within either adnexal region.  03-15-15: chest x-ray: 1. Tracheostomy tube is newly absent. No pneumomediastinum or pneumothorax. 2. Stable mild enlargement of the cardiopericardial silhouette. 3. Linear subsegmental atelectasis in the left lower lobe.  03-16-15: chest x-ray: Lungs clear.  Heart prominent but stable   LABS REVIEWED:   08-06-15: urine culture; e-coli:  ESBL: imipenem   hgb a1c 5.5; urine micro-albumin 144.1 08-13-15: wbc 6.7 hgb 10.6; hct 35.9; mcv 89.8; plt 330; glucose 121; bun 16.7; creat 0.67; k+ 4.0; na++ 143; liver normal albumin 3.5   11-02-15: wbc 8.2; hgb 11.8; hct 37.3; mcv 89.3; plt 220; glucose 109; bun 16.6; creat 0.88; k+ 5.2; na++ 136 11-23-15: wbc 6.3; hgb 13.8; hct 43.9; mcv 96.8; plt 225; glucose 92; bun 27.6; creat 0.86; k+ 4.3; na++ 148  04-26-16: wbc 6.9; hgb 12.;6 hct 38.0; mcv 94.4; plt 231; glucose 94; bun 10.0; creat 0.65; k+ 4.2; na++ 142; liver normal albumin 4.2; chol 177; lld 124; trig 101; hdl 33     Review of Systems  Unable to perform ROS: patient nonverbal     Physical Exam  Constitutional: No distress.  Eyes: Conjunctivae are normal.  Neck: Neck supple. No JVD present. No thyromegaly present.  Cardiovascular: Normal rate, regular rhythm and intact distal pulses.   Respiratory: Effort normal and breath sounds normal. No respiratory distress. She has no wheezes.  GI: abdomen is tender to palpation; bowel sounds are present. Large hard stool in rectum   Has peg tube  Musculoskeletal: She exhibits no edema.  Right hemiparesis   Lymphadenopathy:    She has no cervical adenopathy.  Neurological: She is alert.  Skin: Skin is warm and dry. She is not diaphoretic.  Psychiatric: She has a  normal mood and affect.      ASSESSMENT/ PLAN:  7.  Weight loss:  Her weight is 102 pounds. Will continue tube feeing jevity 1.2 at 80 cc hour 4pm through 8 am. Will get cbc; cmp; vit B 12 folate; tsh; hgb a1c  pre-albumin will get ct of chest abdomen and pelvis with contrast and will continue to monitor her status.    MD is aware of resident's narcotic use and is in agreement with current plan of care. We will attempt to wean resident as apropriate    Synthia Innocent NP [facility]  Contact [phone] Monday through Friday 8am- 5pm  After hours call [phone]

## 2016-06-14 LAB — CBC AND DIFFERENTIAL
HCT: 37 % (ref 36–46)
Hemoglobin: 11.9 g/dL — AB (ref 12.0–16.0)
NEUTROS ABS: 5 /uL
WBC: 8.4 10*3/mL

## 2016-06-14 LAB — LIPID PANEL
Cholesterol: 155 mg/dL (ref 0–200)
HDL: 34 mg/dL — AB (ref 35–70)
LDL Cholesterol: 85 mg/dL
Triglycerides: 181 mg/dL — AB (ref 40–160)

## 2016-06-14 LAB — HEPATIC FUNCTION PANEL
ALK PHOS: 118 U/L (ref 25–125)
ALT: 11 U/L (ref 7–35)
AST: 12 U/L — AB (ref 13–35)
Bilirubin, Total: 4.2 mg/dL

## 2016-06-14 LAB — BASIC METABOLIC PANEL
BUN: 31 mg/dL — AB (ref 4–21)
CREATININE: 0.6 mg/dL (ref 0.5–1.1)
GLUCOSE: 134 mg/dL
POTASSIUM: 4.5 mmol/L (ref 3.4–5.3)
Sodium: 149 mmol/L — AB (ref 137–147)

## 2016-06-14 LAB — VITAMIN B12: VITAMIN B 12: 521

## 2016-06-14 LAB — HM MAMMOGRAPHY

## 2016-06-15 ENCOUNTER — Encounter: Payer: Self-pay | Admitting: *Deleted

## 2016-06-19 ENCOUNTER — Non-Acute Institutional Stay (SKILLED_NURSING_FACILITY): Payer: Medicaid Other | Admitting: Adult Health

## 2016-06-19 ENCOUNTER — Encounter: Payer: Self-pay | Admitting: Adult Health

## 2016-06-19 DIAGNOSIS — E43 Unspecified severe protein-calorie malnutrition: Secondary | ICD-10-CM

## 2016-06-19 DIAGNOSIS — R634 Abnormal weight loss: Secondary | ICD-10-CM

## 2016-06-19 DIAGNOSIS — I69351 Hemiplegia and hemiparesis following cerebral infarction affecting right dominant side: Secondary | ICD-10-CM | POA: Diagnosis not present

## 2016-06-19 DIAGNOSIS — I609 Nontraumatic subarachnoid hemorrhage, unspecified: Secondary | ICD-10-CM | POA: Diagnosis not present

## 2016-06-19 DIAGNOSIS — I2782 Chronic pulmonary embolism: Secondary | ICD-10-CM | POA: Diagnosis not present

## 2016-06-19 DIAGNOSIS — R569 Unspecified convulsions: Secondary | ICD-10-CM

## 2016-06-19 DIAGNOSIS — R1314 Dysphagia, pharyngoesophageal phase: Secondary | ICD-10-CM | POA: Diagnosis not present

## 2016-06-19 NOTE — Progress Notes (Signed)
Location:   [facility] Room Number: 204 A Place of Service:  SNF (31)   CODE STATUS: Full Code  No Known Allergies  Chief Complaint  Patient presents with  . Medical Management of Chronic Issues    1 month follow up    HPI:  She is a long term resident of this facility being seen for the management of her chronic illnesses.  Her ct scans are pending. She has been losing weight; her current weight is 102 pounds. She is unable to fully participate in the hpi or ros. She is grinding her teeth today.   Past Medical History:  Diagnosis Date  . Acute pulmonary embolism (HCC) 02/19/2015  . Acute respiratory failure (HCC)   . Diabetes mellitus without complication (HCC)    Type 2, W/o complications  . DVT (deep venous thrombosis) (HCC) 04/05/2015  . Dysphagia   . Epilepsy (HCC)   . GERD (gastroesophageal reflux disease)   . Hyperlipidemia   . Hypertension   . IBS (irritable bowel syndrome)   . Nontraumatic subarachnoid hemorrhage (HCC)   . SAH (subarachnoid hemorrhage) (HCC)   . Urinary retention     Past Surgical History:  Procedure Laterality Date  . ABDOMINAL SURGERY    . ANEURYSM COILING    . COLONOSCOPY N/A 02/20/2015   Procedure: COLONOSCOPY;  Surgeon: Iva Boop, MD;  Location: [facility] ENDOSCOPY;  Service: Endoscopy;  Laterality: N/A;  . ESOPHAGOGASTRODUODENOSCOPY (EGD) WITH PROPOFOL N/A 02/02/2015   Procedure: ESOPHAGOGASTRODUODENOSCOPY (EGD) WITH PROPOFOL;  Surgeon: Jimmye Norman, MD;  Location: [facility] ENDOSCOPY;  Service: General;  Laterality: N/A;  . IR GASTRIC TUBE PERC CHG W/O IMG GUIDE  06/08/2016  . IR GENERIC HISTORICAL  08/26/2015   IR GASTRIC TUBE PERC CHG W/O IMG GUIDE 08/26/2015 Irish Lack, MD [facility]INTER[facility]  . IR GENERIC HISTORICAL  09/14/2015   IR REPLC GASTRO/COLONIC TUBE PERCUT W/FLUORO 09/14/2015 Darrell K Allred, PA-C [facility]INTER[facility]  . PEG PLACEMENT N/A 02/02/2015   Procedure: PERCUTANEOUS ENDOSCOPIC GASTROSTOMY (PEG) PLACEMENT;  Surgeon: Jimmye Norman,  MD;  Location: [facility] ENDOSCOPY;  Service: General;  Laterality: N/A;  . RADIOLOGY WITH ANESTHESIA N/A 01/13/2015   Procedure: RADIOLOGY WITH ANESTHESIA;  Surgeon: Lisbeth Renshaw, MD;  Location: [facility] OR;  Service: Radiology;  Laterality: N/A;  . TRACHEOSTOMY      Social History   Social History  . Marital status: Single    Spouse name: N/A  . Number of children: N/A  . Years of education: N/A   Occupational History  . Not on file.   Social History Main Topics  . Smoking status: Former Games developer  . Smokeless tobacco: Never Used  . Alcohol use No  . Drug use: No  . Sexual activity: Not on file   Other Topics Concern  . Not on file   Social History Narrative  . No narrative on file   History reviewed. No pertinent family history.    VITAL SIGNS BP 110/74   Pulse (!) 102   Temp 98.2 F (36.8 C)   Resp 18   Ht 5\' 3"  (1.6 m)   Wt 102 lb (46.3 kg)   LMP  (LMP Unknown)   SpO2 98%   BMI 18.07 kg/m   Patient's Medications  New Prescriptions   No medications on file  Previous Medications   ACETAMINOPHEN (TYLENOL) 325 MG TABLET    Take 650 mg by mouth every 4 (four) hours as needed for mild pain or fever.   AMINO ACIDS-PROTEIN HYDROLYS (FEEDING  SUPPLEMENT, PRO-STAT SUGAR FREE 64,) LIQD    Take 60 mLs by mouth 3 (three) times daily with meals.   AMLODIPINE (NORVASC) 5 MG TABLET    Take 5 mg by mouth daily.   BETHANECHOL (URECHOLINE) 10 MG TABLET    10 mg by PEG Tube route every 8 (eight) hours.    CLONIDINE (CATAPRES) 0.1 MG TABLET    Take 0.1 mg by mouth 2 (two) times daily.    COLCHICINE 0.6 MG TABLET    Give 0.1 mg by G-tube two times a day   ENOXAPARIN (LOVENOX) 60 MG/0.6ML INJECTION    Inject 50 mg into the skin 2 (two) times daily.    HYDROCORTISONE CREAM 1 %    Apply 1 application topically 3 (three) times daily. Apply to abdomen for rash   LEVETIRACETAM (KEPPRA) 100 MG/ML SOLUTION    Place 5 mLs (500 mg total) into feeding tube 2 (two) times daily.   METOPROLOL  (LOPRESSOR) 100 MG TABLET    Place 100 mg into feeding tube 2 (two) times daily.   METOPROLOL TARTRATE (LOPRESSOR) 25 MG TABLET    Place 25 mg into feeding tube 2 (two) times daily.   MULTIPLE VITAMINS-MINERALS (DECUBI-VITE) CAPS    Take 1 capsule by mouth daily.   NUTRITIONAL SUPPLEMENTS (FEEDING SUPPLEMENT, JEVITY 1.2 CAL,) LIQD    Give 4480ml/hr x 16 hours from 4:00 PM to 8:00 AM   SKIN PROTECTANTS, MISC. (CALAZIME SKIN PROTECTANT EX)    Apply 1 application topically 2 (two) times daily. Apply to buttocks and left groin for irritation   UNABLE TO FIND    HSG Puree diet - HSG Puree texture, Regular consistency, may have pleasure finger foods with supervision   UNABLE TO FIND    Enternal Feet Order - Give 200 ml if water via G-Tube every 4 hours   WATER FOR INJECTION STERILE IJ    Place 20 mLs into feeding tube every hour.  Modified Medications   No medications on file  Discontinued Medications   SODIUM CHLORIDE 0.9 % INJECTION    Inject 150 mLs into the vein every 4 (four) hours.     SIGNIFICANT DIAGNOSTIC EXAMS  02-17-15: ct angio of chest: 1. Extensive pulmonary emboli involving distal main pulmonary arteries extending into lobar, segmental and subsegmental sized branches throughout the lungs bilaterally. At this time, there are no overt findings to suggest right heart strain. 2. Cardiomegaly with left ventricular concentric hypertrophy. 3. Dependent atelectasis throughout the lower lobes of the lungs bilaterally.  02-18-15: bilateral lower extremity doppler: Findings consistent with acute deep vein thrombosis involving the left common femoral vein, left proximal profunda femoris vein, and left proximal femoral vein. Incidental findings are consistent with: enlarged lymph node on the right. - No evidence of deep vein thrombosis involving the right lower extremity. - No evidence of Baker&'s cyst on the right or left.  02-19-15: pelvic ultrasound: 1. Uterus not identified, presumed  hysterectomy. No mass or free fluid seen within the midline pelvis. 2. Neither ovary is seen, perhaps bilateral oophorectomies, perhaps obscured by the fairly prominent fluid-filled bowel loops in the pelvis. No mass or free fluid seen within either adnexal region.  03-15-15: chest x-ray: 1. Tracheostomy tube is newly absent. No pneumomediastinum or pneumothorax. 2. Stable mild enlargement of the cardiopericardial silhouette. 3. Linear subsegmental atelectasis in the left lower lobe.  03-16-15: chest x-ray: Lungs clear.  Heart prominent but stable  06-14-16: diagnostic right mammogram: benign cyst   LABS REVIEWED:  08-06-15: urine culture; e-coli: ESBL: imipenem   hgb a1c 5.5; urine micro-albumin 144.1 08-13-15: wbc 6.7 hgb 10.6; hct 35.9; mcv 89.8; plt 330; glucose 121; bun 16.7; creat 0.67; k+ 4.0; na++ 143; liver normal albumin 3.5   11-02-15: wbc 8.2; hgb 11.8; hct 37.3; mcv 89.3; plt 220; glucose 109; bun 16.6; creat 0.88; k+ 5.2; na++ 136 11-23-15: wbc 6.3; hgb 13.8; hct 43.9; mcv 96.8; plt 225; glucose 92; bun 27.6; creat 0.86; k+ 4.3; na++ 148  04-26-16: wbc 6.9; hgb 12.;6 hct 38.0; mcv 94.4; plt 231; glucose 94; bun 10.0; creat 0.65; k+ 4.2; na++ 142; liver normal albumin 4.2; chol 177; lld 124; trig 101; hdl 33  06-14-16: wbc 8.4; hgb 11.9; hct 36.6; mcv 95.4; plt 203; glucose 134; bun 30.7; creat 0.62; k+ 4.5 ;na++ 149; liver normal albumin 4.2; vit B 12: 521; folate 19.1; chol 155; ldl 85; trig 181; hdl 34; hgb a1c 5.7     Review of Systems  Unable to perform ROS: patient nonverbal     Physical Exam  Constitutional: No distress.  Frail   Eyes: Conjunctivae are normal.  Neck: Neck supple. No JVD present. No thyromegaly present.  Cardiovascular: Regular rhythm, normal heart sounds and intact distal pulses.   Slightly tachycardic   Respiratory: Effort normal and breath sounds normal. No respiratory distress. She has no wheezes.  GI: Soft. Bowel sounds are normal. She exhibits no  distension. There is no tenderness.  Peg tube present   Musculoskeletal: She exhibits no edema.  Right hemiplegia   Lymphadenopathy:    She has no cervical adenopathy.  Neurological: She is alert.  Skin: Skin is warm and dry. She is not diaphoretic.  Psychiatric: She has a normal mood and affect.      ASSESSMENT/ PLAN:   1. Diabetes: hgb a1c is 5.7 (previous 5.5) ; is currently off medications; will continue to monitor her status.  ldl is 85   2. Seizure: no reports of seizure activity present: will continue keppra 500 mg twice daily and will monitor   3. 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  3. Hypertension:b/p110/74   will continue lopressor 125 mg twice daily and clonidine 0.1 mg twice daily norvasc 5 mg daily   4.  Dysphagia: no signs of aspiration present  her po intake is not adequate to maintain her body weight; she is dependent upon peg tube feedings.  5. SAH: is neurologically without change; has right hemiparesis; will monitor her status.   6. UTI/urine retention: will continue urecholine 10 mg every 8 hours  Does get I/O cath twice daily   7.  Weight loss:  Her weight is 102 pounds. will continue her tube feeding per dietician recommendations her blood work is normal awaiting ct scans.     MD is aware of resident's narcotic use and is in agreement with current plan of care. We will attempt to wean resident as apropriate     Synthia Innocent NP [facility]  Contact [phone] Monday through Friday 8am- 5pm  After hours call [phone]

## 2016-06-22 ENCOUNTER — Non-Acute Institutional Stay (SKILLED_NURSING_FACILITY): Payer: Medicaid Other | Admitting: Adult Health

## 2016-06-22 ENCOUNTER — Encounter: Payer: Self-pay | Admitting: Adult Health

## 2016-06-22 DIAGNOSIS — I609 Nontraumatic subarachnoid hemorrhage, unspecified: Secondary | ICD-10-CM

## 2016-06-22 DIAGNOSIS — R1314 Dysphagia, pharyngoesophageal phase: Secondary | ICD-10-CM | POA: Diagnosis not present

## 2016-06-22 DIAGNOSIS — R634 Abnormal weight loss: Secondary | ICD-10-CM | POA: Diagnosis not present

## 2016-06-22 NOTE — Progress Notes (Signed)
Location:   [facility] Room Number: 204 A Place of Service:  SNF (31)   CODE STATUS: Full Code  No Known Allergies  Chief Complaint  Patient presents with  . Acute Visit    Staff Concerns    HPI:  She has been losing weight. She is on long term tube feeding for her nutritional support. Staff reports to me that she has been pulling apart her tube feeding apart and letting the feeding run on the floor on the bed. She told me that she is afraid that with the feeding attached to the peg tube she will pull it out at night. She would benefit from an abdominal binder to protect the tube while she sleeps and she has agreed to this plan.    Past Medical History:  Diagnosis Date  . Acute pulmonary embolism (HCC) 02/19/2015  . Acute respiratory failure (HCC)   . Diabetes mellitus without complication (HCC)    Type 2, W/o complications  . DVT (deep venous thrombosis) (HCC) 04/05/2015  . Dysphagia   . Epilepsy (HCC)   . GERD (gastroesophageal reflux disease)   . Hyperlipidemia   . Hypertension   . IBS (irritable bowel syndrome)   . Nontraumatic subarachnoid hemorrhage (HCC)   . SAH (subarachnoid hemorrhage) (HCC)   . Urinary retention     Past Surgical History:  Procedure Laterality Date  . ABDOMINAL SURGERY    . ANEURYSM COILING    . COLONOSCOPY N/A 02/20/2015   Procedure: COLONOSCOPY;  Surgeon: Iva Booparl E Gessner, MD;  [facility];  Service: Endoscopy;  Laterality: N/A;  . ESOPHAGOGASTRODUODENOSCOPY (EGD) WITH PROPOFOL N/A 02/02/2015   Procedure: ESOPHAGOGASTRODUODENOSCOPY (EGD) WITH PROPOFOL;  Surgeon: Jimmye NormanJames Wyatt, MD;  [facility];  Service: General;  Laterality: N/A;  . IR GASTRIC TUBE PERC CHG W/O IMG GUIDE  06/08/2016  . IR GENERIC HISTORICAL  08/26/2015   IR GASTRIC TUBE PERC CHG W/O IMG GUIDE 08/26/2015 Irish LackGlenn Yamagata, MD [facility]INTER[facility]  . IR GENERIC HISTORICAL  09/14/2015   IR REPLC GASTRO/COLONIC TUBE PERCUT W/FLUORO 09/14/2015 Darrell K Allred,  PA-C [facility]  . PEG PLACEMENT N/A 02/02/2015   Procedure: PERCUTANEOUS ENDOSCOPIC GASTROSTOMY (PEG) PLACEMENT;  Surgeon: Jimmye NormanJames Wyatt, MD;  [facility];  Service: General;  Laterality: N/A;  . RADIOLOGY WITH ANESTHESIA N/A 01/13/2015   Procedure: RADIOLOGY WITH ANESTHESIA;  Surgeon: Lisbeth RenshawNeelesh Nundkumar, MD;  Location: [facility] OR;  Service: Radiology;  Laterality: N/A;  . TRACHEOSTOMY      Social History   Social History  . Marital status: Single    Spouse name: N/A  . Number of children: N/A  . Years of education: N/A   Occupational History  . Not on file.   Social History Main Topics  . Smoking status: Former Games developermoker  . Smokeless tobacco: Never Used  . Alcohol use No  . Drug use: No  . Sexual activity: Not on file   Other Topics Concern  . Not on file   Social History Narrative  . No narrative on file   History reviewed. No pertinent family history.    VITAL SIGNS BP 110/74   Pulse (!) 102   Temp 98.2 F (36.8 C)   Resp 18   Ht 5\' 3"  (1.6 m)   Wt 102 lb (46.3 kg)   LMP  (LMP Unknown)   SpO2 98%   BMI 18.07 kg/m   Patient's Medications  New Prescriptions   No medications on file  Previous Medications   ACETAMINOPHEN (  TYLENOL) 325 MG TABLET    Place 650 mg into feeding tube every 4 (four) hours as needed for mild pain or fever.    AMINO ACIDS-PROTEIN HYDROLYS (FEEDING SUPPLEMENT, PRO-STAT SUGAR FREE 64,) LIQD    Place 60 mLs into feeding tube 3 (three) times daily with meals.    AMLODIPINE (NORVASC) 5 MG TABLET    Place 5 mg into feeding tube daily.    BETHANECHOL (URECHOLINE) 10 MG TABLET    10 mg by PEG Tube route every 8 (eight) hours.    CLONIDINE (CATAPRES) 0.1 MG TABLET    Place 0.1 mg into feeding tube 2 (two) times daily.    ENOXAPARIN (LOVENOX) 60 MG/0.6ML INJECTION    Inject 50 mg into the skin 2 (two) times daily.    HYDROCORTISONE CREAM 1 %    Apply 1 application topically 3 (three) times daily. Apply to abdomen for rash   LEVETIRACETAM  (KEPPRA) 100 MG/ML SOLUTION    Place 5 mLs (500 mg total) into feeding tube 2 (two) times daily.   METOPROLOL (LOPRESSOR) 100 MG TABLET    Place 100 mg into feeding tube 2 (two) times daily.   METOPROLOL TARTRATE (LOPRESSOR) 25 MG TABLET    Place 25 mg into feeding tube 2 (two) times daily.   MULTIPLE VITAMINS-MINERALS (DECUBI-VITE) CAPS    Place 1 capsule into feeding tube daily.    NUTRITIONAL SUPPLEMENTS (FEEDING SUPPLEMENT, JEVITY 1.2 CAL,) LIQD    Give 51ml/hr x 16 hours from 4:00 PM to 8:00 AM   POLYETHYLENE GLYCOL (MIRALAX / GLYCOLAX) PACKET    Place 17 g into feeding tube daily.   SKIN PROTECTANTS, MISC. (CALAZIME SKIN PROTECTANT EX)    Apply 1 application topically 2 (two) times daily. Apply to buttocks and left groin for irritation   UNABLE TO FIND    HSG Puree diet - HSG Puree texture, Regular consistency, may have pleasure finger foods with supervision   UNABLE TO FIND    Enternal Feet Order - Give 200 ml if water via G-Tube every 4 hours   WATER FOR INJECTION STERILE IJ    Place 20 mLs into feeding tube every hour.  Modified Medications   No medications on file  Discontinued Medications   COLCHICINE 0.6 MG TABLET    Give 0.1 mg by G-tube two times a day     SIGNIFICANT DIAGNOSTIC EXAMS   02-17-15: ct angio of chest: 1. Extensive pulmonary emboli involving distal main pulmonary arteries extending into lobar, segmental and subsegmental sized branches throughout the lungs bilaterally. At this time, there are no overt findings to suggest right heart strain. 2. Cardiomegaly with left ventricular concentric hypertrophy. 3. Dependent atelectasis throughout the lower lobes of the lungs bilaterally.  02-18-15: bilateral lower extremity doppler: Findings consistent with acute deep vein thrombosis involving the left common femoral vein, left proximal profunda femoris vein, and left proximal femoral vein. Incidental findings are consistent with: enlarged lymph node on the right. - No evidence  of deep vein thrombosis involving the right lower extremity. - No evidence of Baker&'s cyst on the right or left.  02-19-15: pelvic ultrasound: 1. Uterus not identified, presumed hysterectomy. No mass or free fluid seen within the midline pelvis. 2. Neither ovary is seen, perhaps bilateral oophorectomies, perhaps obscured by the fairly prominent fluid-filled bowel loops in the pelvis. No mass or free fluid seen within either adnexal region.  03-15-15: chest x-ray: 1. Tracheostomy tube is newly absent. No pneumomediastinum or pneumothorax. 2. Stable mild enlargement  of the cardiopericardial silhouette. 3. Linear subsegmental atelectasis in the left lower lobe.  03-16-15: chest x-ray: Lungs clear.  Heart prominent but stable  06-14-16: diagnostic right mammogram: benign cyst   LABS REVIEWED:   08-06-15: urine culture; e-coli: ESBL: imipenem   hgb a1c 5.5; urine micro-albumin 144.1 08-13-15: wbc 6.7 hgb 10.6; hct 35.9; mcv 89.8; plt 330; glucose 121; bun 16.7; creat 0.67; k+ 4.0; na++ 143; liver normal albumin 3.5   11-02-15: wbc 8.2; hgb 11.8; hct 37.3; mcv 89.3; plt 220; glucose 109; bun 16.6; creat 0.88; k+ 5.2; na++ 136 11-23-15: wbc 6.3; hgb 13.8; hct 43.9; mcv 96.8; plt 225; glucose 92; bun 27.6; creat 0.86; k+ 4.3; na++ 148  04-26-16: wbc 6.9; hgb 12.;6 hct 38.0; mcv 94.4; plt 231; glucose 94; bun 10.0; creat 0.65; k+ 4.2; na++ 142; liver normal albumin 4.2; chol 177; lld 124; trig 101; hdl 33  06-14-16: wbc 8.4; hgb 11.9; hct 36.6; mcv 95.4; plt 203; glucose 134; bun 30.7; creat 0.62; k+ 4.5 ;na++ 149; liver normal albumin 4.2; vit B 12: 521; folate 19.1; chol 155; ldl 85; trig 181; hdl 34; hgb a1c 5.7     Review of Systems  Unable to perform ROS: patient nonverbal     Physical Exam  Constitutional: No distress.  Frail   Eyes: Conjunctivae are normal.  Neck: Neck supple. No JVD present. No thyromegaly present.  Cardiovascular: Regular rhythm, normal heart sounds and intact distal  pulses.   Slightly tachycardic   Respiratory: Effort normal and breath sounds normal. No respiratory distress. She has no wheezes.  GI: Soft. Bowel sounds are normal. She exhibits no distension. There is no tenderness.  Peg tube present   Musculoskeletal: She exhibits no edema.  Right hemiplegia   Lymphadenopathy:    She has no cervical adenopathy.  Neurological: She is alert.  Skin: Skin is warm and dry. She is not diaphoretic.  Psychiatric: She has a normal mood and affect.      ASSESSMENT/ PLAN:  1.  Dysphagia: no signs of aspiration present  her po intake is not adequate to maintain her body weight; she is dependent upon peg tube feedings.  2. SAH: is neurologically without change; has right hemiparesis; will monitor her status.   3.  Weight loss:  Her weight is 102 pounds. Will have nursing place an abdominal binder at night to provide her with emotional comfort for the fear of pulling out her peg tube. Will continue to monitor her status.     Time spent with patient 40   minutes >50% time spent counseling; reviewing medical record; tests; labs; and developing future plan of care    Synthia Innocent NP [facility]  Contact [phone] Monday through Friday 8am- 5pm  After hours call [phone]

## 2016-07-03 DIAGNOSIS — I2782 Chronic pulmonary embolism: Secondary | ICD-10-CM | POA: Insufficient documentation

## 2016-07-15 ENCOUNTER — Encounter (HOSPITAL_COMMUNITY): Payer: Self-pay | Admitting: Emergency Medicine

## 2016-07-15 ENCOUNTER — Emergency Department (HOSPITAL_COMMUNITY)
Admission: EM | Admit: 2016-07-15 | Discharge: 2016-07-15 | Disposition: A | Discharge status: Home / Self Care | Payer: Medicaid Other | Attending: Emergency Medicine | Admitting: Emergency Medicine

## 2016-07-15 DIAGNOSIS — K9423 Gastrostomy malfunction: Secondary | ICD-10-CM | POA: Diagnosis present

## 2016-07-15 DIAGNOSIS — I10 Essential (primary) hypertension: Secondary | ICD-10-CM | POA: Insufficient documentation

## 2016-07-15 DIAGNOSIS — Z79899 Other long term (current) drug therapy: Secondary | ICD-10-CM | POA: Insufficient documentation

## 2016-07-15 DIAGNOSIS — E119 Type 2 diabetes mellitus without complications: Secondary | ICD-10-CM | POA: Insufficient documentation

## 2016-07-15 DIAGNOSIS — Z87891 Personal history of nicotine dependence: Secondary | ICD-10-CM | POA: Insufficient documentation

## 2016-07-15 NOTE — ED Triage Notes (Signed)
Brought in by EMS from [facility] ([address]) with request to change peg tube due to "some issue".  RN at the facility reported that the piston syringe keeps on "popping out" during feeding or flushing.  Pt's peg tube appears intact.

## 2016-07-15 NOTE — ED Notes (Signed)
Bed: [id number] Expected date:  Expected time:  Means of arrival:  Comments: 51 yo peg tube removal

## 2016-07-15 NOTE — ED Notes (Signed)
Pt's peg tube intact and patent---- flushes without difficulty via gravity, and piston syringe remained connected to peg tube during flush.

## 2016-07-15 NOTE — ED Provider Notes (Signed)
[facility]-EMERGENCY DEPT Provider Note   CSN: [id number] Arrival date & time: 07/15/16  0449     History   Chief Complaint Chief Complaint  Patient presents with  . PEG Tube Issue    HPI Rachel Vang is a 51 y.o. female.  [age] yoF with a chief complaint of a PEG tube malfunction. Per the nursing home they're having issues with the connector from the tube to the port in which fluid is administered. Level V caveat dementia.   The history is provided by the nursing home and the patient.  Illness  This is a new problem. The current episode started 1 to 2 hours ago. The problem occurs constantly. The problem has not changed since onset.Pertinent negatives include no chest pain, no headaches and no shortness of breath. Nothing aggravates the symptoms. Nothing relieves the symptoms. She has tried nothing for the symptoms. The treatment provided no relief.    Past Medical History:  Diagnosis Date  . Acute pulmonary embolism (HCC) 02/19/2015  . Acute respiratory failure (HCC)   . Diabetes mellitus without complication (HCC)    Type 2, W/o complications  . DVT (deep venous thrombosis) (HCC) 04/05/2015  . Dysphagia   . Epilepsy (HCC)   . GERD (gastroesophageal reflux disease)   . Hyperlipidemia   . Hypertension   . IBS (irritable bowel syndrome)   . Nontraumatic subarachnoid hemorrhage (HCC)   . SAH (subarachnoid hemorrhage) (HCC)   . Urinary retention     Patient Active Problem List   Diagnosis Date Noted  . Chronic pulmonary embolism (HCC) 07/03/2016  . Weight loss, non-intentional 06/13/2016  . Hemiparesis affecting right side as late effect of stroke (HCC) 09/25/2015  . Type II diabetes mellitus with neurological manifestations (HCC) 05/20/2015  . Essential hypertension, benign 05/20/2015  . S/P percutaneous endoscopic gastrostomy (PEG) tube placement (HCC) 05/20/2015  . DVT (deep venous thrombosis) (HCC) 04/05/2015  . Protein-calorie malnutrition, severe (HCC) 03/25/2015    . Dysphagia 02/18/2015  . Seizures (HCC) 02/18/2015  . History of ETT   . Subarachnoid hemorrhage [facility](HCC)     Past Surgical History:  Procedure Laterality Date  . ABDOMINAL SURGERY    . ANEURYSM COILING    . COLONOSCOPY N/A 02/20/2015   Procedure: COLONOSCOPY;  Surgeon: Iva Booparl E Gessner, MD;  [facility];  Service: Endoscopy;  Laterality: N/A;  . ESOPHAGOGASTRODUODENOSCOPY (EGD) WITH PROPOFOL N/A 02/02/2015   Procedure: ESOPHAGOGASTRODUODENOSCOPY (EGD) WITH PROPOFOL;  Surgeon: Jimmye NormanJames Wyatt, MD;  [facility];  Service: General;  Laterality: N/A;  . IR GASTRIC TUBE PERC CHG W/O IMG GUIDE  06/08/2016  . IR GENERIC HISTORICAL  08/26/2015   IR GASTRIC TUBE PERC CHG W/O IMG GUIDE 08/26/2015 Irish LackGlenn Yamagata, MD [facility]-INTERV RAD  . IR GENERIC HISTORICAL  09/14/2015   IR REPLC GASTRO/COLONIC TUBE PERCUT W/FLUORO 09/14/2015 Darrell K Allred, PA-C [facility]-INTERV RAD  . PEG PLACEMENT N/A 02/02/2015   Procedure: PERCUTANEOUS ENDOSCOPIC GASTROSTOMY (PEG) PLACEMENT;  Surgeon: Jimmye NormanJames Wyatt, MD;  [facility];  Service: General;  Laterality: N/A;  . RADIOLOGY WITH ANESTHESIA N/A 01/13/2015   Procedure: RADIOLOGY WITH ANESTHESIA;  Surgeon: Lisbeth RenshawNeelesh Nundkumar, MD;  Location: [facility] OR;  Service: Radiology;  Laterality: N/A;  . TRACHEOSTOMY      OB History    No data available       Home Medications    Prior to Admission medications   Medication Sig Start Date End Date Taking? Authorizing Provider  acetaminophen (TYLENOL) 325 MG tablet Place 650 mg into feeding  tube every 4 (four) hours as needed for mild pain or fever.     [provider]  Amino Acids-Protein Hydrolys (FEEDING SUPPLEMENT, PRO-STAT SUGAR FREE 64,) LIQD Place 60 mLs into feeding tube 3 (three) times daily with meals.     [provider]  amLODipine (NORVASC) 5 MG tablet Place 5 mg into feeding tube daily.     [provider]  bethanechol (URECHOLINE) 10 MG tablet 10 mg by PEG Tube route every 8  (eight) hours.     [provider]  cloNIDine (CATAPRES) 0.1 MG tablet Place 0.1 mg into feeding tube 2 (two) times daily.     [provider]  enoxaparin (LOVENOX) 60 MG/0.6ML injection Inject 50 mg into the skin 2 (two) times daily.     [provider]  hydrocortisone cream 1 % Apply 1 application topically 3 (three) times daily. Apply to abdomen for rash    [provider]  levETIRAcetam (KEPPRA) 100 MG/ML solution Place 5 mLs (500 mg total) into feeding tube 2 (two) times daily. 02/11/15   Leroy Sea, MD  metoprolol (LOPRESSOR) 100 MG tablet Place 100 mg into feeding tube 2 (two) times daily.    [provider]  metoprolol tartrate (LOPRESSOR) 25 MG tablet Place 25 mg into feeding tube 2 (two) times daily.    [provider]  Multiple Vitamins-Minerals (DECUBI-VITE) CAPS Place 1 capsule into feeding tube daily.     [provider]  Nutritional Supplements (FEEDING SUPPLEMENT, JEVITY 1.2 CAL,) LIQD Give 61ml/hr x 16 hours from 4:00 PM to 8:00 AM    [provider]  polyethylene glycol (MIRALAX / GLYCOLAX) packet Place 17 g into feeding tube daily.    [provider]  Skin Protectants, Misc. (CALAZIME SKIN PROTECTANT EX) Apply 1 application topically 2 (two) times daily. Apply to buttocks and left groin for irritation    [provider]  UNABLE TO FIND HSG Puree diet - HSG Puree texture, Regular consistency, may have pleasure finger foods with supervision    [provider]  UNABLE TO FIND Enternal Feet Order - Give 200 ml if water via G-Tube every 4 hours    [provider]  WATER FOR INJECTION STERILE IJ Place 20 mLs into feeding tube every hour.    [provider]    Family History History reviewed. No pertinent family history.  Social History Social History  Substance Use Topics  . Smoking status: Former Games developer  . Smokeless tobacco: Never Used  . Alcohol use No      Allergies   Patient has no known allergies.   Review of Systems Review of Systems  Constitutional: Negative for chills and fever.  HENT: Negative for congestion and rhinorrhea.   Eyes: Negative for redness and visual disturbance.  Respiratory: Negative for shortness of breath and wheezing.   Cardiovascular: Negative for chest pain and palpitations.  Gastrointestinal: Negative for nausea and vomiting.  Genitourinary: Negative for dysuria and urgency.  Musculoskeletal: Negative for arthralgias and myalgias.  Skin: Negative for pallor and wound.  Neurological: Negative for dizziness and headaches.     Physical Exam Updated Vital Signs BP 115/80 (BP Location: Left Arm)   Pulse 65   Temp 98 F (36.7 C) (Oral)   Resp 16   Ht 5\' 5"  (1.651 m)   Wt 47.6 kg (105 lb)   LMP  (LMP Unknown)   SpO2 99%   BMI 17.47 kg/m   Physical Exam  Constitutional: She  is oriented to person, place, and time. She appears well-developed and well-nourished. No distress.  HENT:  Head: Normocephalic and atraumatic.  Eyes: EOM are normal. Pupils are equal, round, and reactive to light.  Neck: Normal range of motion. Neck supple.  Cardiovascular: Normal rate and regular rhythm.  Exam reveals no gallop and no friction rub.   No murmur heard. Pulmonary/Chest: Effort normal. She has no wheezes. She has no rales.  Abdominal: Soft. She exhibits no distension. There is no tenderness. There is no guarding.  PEG in place, drains to gravity, flushes without difficulty.  Musculoskeletal: She exhibits no edema or tenderness.  Neurological: She is alert and oriented to person, place, and time.  Skin: Skin is warm and dry. She is not diaphoretic.  Psychiatric: She has a normal mood and affect. Her behavior is normal.  Nursing note and vitals reviewed.    ED Treatments / Results  Labs (all labs ordered are listed, but only abnormal results are displayed) Labs Reviewed - No data to display  EKG  EKG  Interpretation None       Radiology No results found.  Procedures Procedures (including critical care time)  Medications Ordered in ED Medications - No data to display   Initial Impression / Assessment and Plan / ED Course  I have reviewed the triage vital signs and the nursing notes.  Pertinent labs & imaging results that were available during my care of the patient were reviewed by me and considered in my medical decision making (see chart for details).     51 yo F With a chief complaint of a PEG tube malfunction. Transient gravity without difficulty. Also flushes. Seems to be working normally here. We'll have her follow-up with her family physician.  6:09 AM:  I have discussed the diagnosis/risks/treatment options with the patient and family and believe the pt to be eligible for discharge home to follow-up with PCP. We also discussed returning to the ED immediately if new or worsening sx occur. We discussed the sx which are most concerning (e.g., sudden worsening pain, fever, inability to tolerate by mouth) that necessitate immediate return. Medications administered to the patient during their visit and any new prescriptions provided to the patient are listed below.  Medications given during this visit Medications - No data to display   The patient appears reasonably screen and/or stabilized for discharge and I doubt any other medical condition or other [facility] requiring further screening, evaluation, or treatment in the ED at this time prior to discharge.    Final Clinical Impressions(s) / ED Diagnoses   Final diagnoses:  PEG tube malfunction [facility])    New Prescriptions New Prescriptions   No medications on file     Melene Plan, DO 07/15/16 1610

## 2016-07-15 NOTE — ED Notes (Signed)
[facility] was called for pt's transportation back to [facility] facility.

## 2016-07-15 NOTE — ED Notes (Signed)
[facility] here to transport pt back to [facility].

## 2016-07-15 NOTE — Discharge Instructions (Signed)
We found not issues with troubleshooting your PEG tube.  Discuss with your PCP.

## 2016-07-25 ENCOUNTER — Non-Acute Institutional Stay (SKILLED_NURSING_FACILITY): Payer: Medicaid Other | Admitting: Adult Health

## 2016-07-25 ENCOUNTER — Encounter: Payer: Self-pay | Admitting: Adult Health

## 2016-07-25 DIAGNOSIS — I609 Nontraumatic subarachnoid hemorrhage, unspecified: Secondary | ICD-10-CM

## 2016-07-25 DIAGNOSIS — I2782 Chronic pulmonary embolism: Secondary | ICD-10-CM | POA: Diagnosis not present

## 2016-07-25 DIAGNOSIS — I10 Essential (primary) hypertension: Secondary | ICD-10-CM | POA: Diagnosis not present

## 2016-07-25 DIAGNOSIS — I69351 Hemiplegia and hemiparesis following cerebral infarction affecting right dominant side: Secondary | ICD-10-CM

## 2016-07-25 DIAGNOSIS — E43 Unspecified severe protein-calorie malnutrition: Secondary | ICD-10-CM

## 2016-07-25 DIAGNOSIS — R569 Unspecified convulsions: Secondary | ICD-10-CM

## 2016-07-25 DIAGNOSIS — E1149 Type 2 diabetes mellitus with other diabetic neurological complication: Secondary | ICD-10-CM

## 2016-07-25 DIAGNOSIS — R634 Abnormal weight loss: Secondary | ICD-10-CM

## 2016-07-25 NOTE — Progress Notes (Signed)
Location:   [facility] Room Number: 204 A Place of Service:  SNF (31)   CODE STATUS:  Full Code  No Known Allergies  Chief Complaint  Patient presents with  . Medical Management of Chronic Issues    1 month follow up    HPI:  She is a 51 year old long term resident of this facility being seen for the management of her chronic illnesses. She has gained a slight amount of weight from May 2018:  102 pounds to her weight on 07-25-16: 104 pounds. She is allowing her tube feeing to ran during the night.  There are no nursing concerns at this time.    Past Medical History:  Diagnosis Date  . Acute pulmonary embolism (HCC) 02/19/2015  . Acute respiratory failure (HCC)   . Diabetes mellitus without complication (HCC)    Type 2, W/o complications  . DVT (deep venous thrombosis) (HCC) 04/05/2015  . Dysphagia   . Epilepsy (HCC)   . GERD (gastroesophageal reflux disease)   . Hyperlipidemia   . Hypertension   . IBS (irritable bowel syndrome)   . Nontraumatic subarachnoid hemorrhage (HCC)   . SAH (subarachnoid hemorrhage) (HCC)   . Urinary retention     Past Surgical History:  Procedure Laterality Date  . ABDOMINAL SURGERY    . ANEURYSM COILING    . COLONOSCOPY N/A 02/20/2015   Procedure: COLONOSCOPY;  Surgeon: Iva Boop, MD;  Location[facility] END[facility]COPY;  Service: Endoscopy;  Laterality: N/A;  . ESOPHAGOGASTRODUODENOSCOPY (EGD) WITH PROPOFOL N/A 02/02/2015   Procedure: ESOPHAGOGASTRODUODENOSCOPY (EGD) WITH PROPOFOL;  Surgeon: Jimmye Norman, MD;  Location: [facility] ENDOSCOPY;  Service: General;  Laterality: N/A;  . IR GASTRIC TUBE PERC CHG W/O IMG GUIDE  06/08/2016  . IR GENERIC HISTORICAL  08/26/2015   IR GASTRIC TUBE PERC CHG W/O IMG GUIDE 08/26/2015 Irish Lack, MD [facility]INTERV RAD  . IR GENERIC HISTORICAL  09/14/2015   IR REPLC GASTRO/COLONIC TUBE PERCUT W/FLUORO 09/14/2015 Darrell K Allred, PA-C [facility]INTERV RAD  . PEG PLACEMENT N/A 02/02/2015   Procedure: PERCUTANEOUS ENDOSCOPIC  GASTROSTOMY (PEG) PLACEMENT;  Surgeon: Jimmye Norman, MD;  Location: [facility] ENDOSCOPY;  Service: General;  Laterality: N/A;  . RADIOLOGY WITH ANESTHESIA N/A 01/13/2015   Procedure: RADIOLOGY WITH ANESTHESIA;  Surgeon: Lisbeth Renshaw, MD;  Location: [facility] OR;  Service: Radiology;  Laterality: N/A;  . TRACHEOSTOMY      Social History   Social History  . Marital status: Single    Spouse name: N/A  . Number of children: N/A  . Years of education: N/A   Occupational History  . Not on file.   Social History Main Topics  . Smoking status: Former Games developer  . Smokeless tobacco: Never Used  . Alcohol use No  . Drug use: No  . Sexual activity: Not on file   Other Topics Concern  . Not on file   Social History Narrative  . No narrative on file   History reviewed. No pertinent family history.    VITAL SIGNS BP 120/68   Pulse 74   Temp 99 F (37.2 C)   Resp 17   Ht 5\' 5"  (1.651 m)   Wt 104 lb 9.6 oz (47.4 kg)   LMP  (LMP Unknown)   SpO2 98%   BMI 17.41 kg/m   Patient's Medications  New Prescriptions   No medications on file  Previous Medications   ACETAMINOPHEN (TYLENOL) 325 MG TABLET    Place 650 mg into feeding tube every 4 (four)  hours as needed for mild pain or fever.    AMINO ACIDS-PROTEIN HYDROLYS (FEEDING SUPPLEMENT, PRO-STAT SUGAR FREE 64,) LIQD    Place 60 mLs into feeding tube 3 (three) times daily with meals.    AMLODIPINE (NORVASC) 5 MG TABLET    Place 5 mg into feeding tube daily.    BETHANECHOL (URECHOLINE) 10 MG TABLET    10 mg by PEG Tube route every 8 (eight) hours.    CLONIDINE (CATAPRES) 0.1 MG TABLET    Place 0.1 mg into feeding tube 2 (two) times daily.    ENOXAPARIN (LOVENOX) 60 MG/0.6ML INJECTION    Inject 50 mg into the skin 2 (two) times daily.    HYDROCORTISONE CREAM 1 %    Apply 1 application topically 3 (three) times daily. Apply to abdomen for rash   LEVETIRACETAM (KEPPRA) 100 MG/ML SOLUTION    Place 5 mLs (500 mg total) into feeding tube 2 (two) times  daily.   METOPROLOL (LOPRESSOR) 100 MG TABLET    Place 100 mg into feeding tube 2 (two) times daily.   METOPROLOL TARTRATE (LOPRESSOR) 25 MG TABLET    Place 25 mg into feeding tube 2 (two) times daily.   MULTIPLE VITAMINS-MINERALS (DECUBI-VITE) CAPS    Place 1 capsule into feeding tube daily.    NUTRITIONAL SUPPLEMENTS (FEEDING SUPPLEMENT, JEVITY 1.2 CAL,) LIQD    Give 15ml/hr x 16 hours from 4:00 PM to 8:00 AM   POLYETHYLENE GLYCOL (MIRALAX / GLYCOLAX) PACKET    Place 17 g into feeding tube daily.   SKIN PROTECTANTS, MISC. (CALAZIME SKIN PROTECTANT EX)    Apply 1 application topically 2 (two) times daily. Apply to buttocks and left groin for irritation   UNABLE TO FIND    HSG Puree diet - HSG Puree texture, Regular consistency, may have pleasure finger foods with supervision   UNABLE TO FIND    Enternal Feet Order - Give 200 ml if water via G-Tube every 4 hours   WATER FOR INJECTION STERILE IJ    Place 20 mLs into feeding tube every hour.  Modified Medications   No medications on file  Discontinued Medications   No medications on file     SIGNIFICANT DIAGNOSTIC EXAMS  02-17-15: ct angio of chest: 1. Extensive pulmonary emboli involving distal main pulmonary arteries extending into lobar, segmental and subsegmental sized branches throughout the lungs bilaterally. At this time, there are no overt findings to suggest right heart strain. 2. Cardiomegaly with left ventricular concentric hypertrophy. 3. Dependent atelectasis throughout the lower lobes of the lungs bilaterally.  02-18-15: bilateral lower extremity doppler: Findings consistent with acute deep vein thrombosis involving the left common femoral vein, left proximal profunda femoris vein, and left proximal femoral vein. Incidental findings are consistent with: enlarged lymph node on the right. - No evidence of deep vein thrombosis involving the right lower extremity. - No evidence of Baker&'s cyst on the right or left.  02-19-15: pelvic  ultrasound: 1. Uterus not identified, presumed hysterectomy. No mass or free fluid seen within the midline pelvis. 2. Neither ovary is seen, perhaps bilateral oophorectomies, perhaps obscured by the fairly prominent fluid-filled bowel loops in the pelvis. No mass or free fluid seen within either adnexal region.  03-15-15: chest x-ray: 1. Tracheostomy tube is newly absent. No pneumomediastinum or pneumothorax. 2. Stable mild enlargement of the cardiopericardial silhouette. 3. Linear subsegmental atelectasis in the left lower lobe.  03-16-15: chest x-ray: Lungs clear.  Heart prominent but stable  06-14-16: diagnostic right mammogram:  benign cyst   LABS REVIEWED:   08-06-15: urine culture; e-coli: ESBL: imipenem   hgb a1c 5.5; urine micro-albumin 144.1 08-13-15: wbc 6.7 hgb 10.6; hct 35.9; mcv 89.8; plt 330; glucose 121; bun 16.7; creat 0.67; k+ 4.0; na++ 143; liver normal albumin 3.5   11-02-15: wbc 8.2; hgb 11.8; hct 37.3; mcv 89.3; plt 220; glucose 109; bun 16.6; creat 0.88; k+ 5.2; na++ 136 11-23-15: wbc 6.3; hgb 13.8; hct 43.9; mcv 96.8; plt 225; glucose 92; bun 27.6; creat 0.86; k+ 4.3; na++ 148  04-26-16: wbc 6.9; hgb 12.;6 hct 38.0; mcv 94.4; plt 231; glucose 94; bun 10.0; creat 0.65; k+ 4.2; na++ 142; liver normal albumin 4.2; chol 177; lld 124; trig 101; hdl 33  06-14-16: wbc 8.4; hgb 11.9; hct 36.6; mcv 95.4; plt 203; glucose 134; bun 30.7; creat 0.62; k+ 4.5 ;na++ 149; liver normal albumin 4.2; vit B 12: 521; folate 19.1; chol 155; ldl 85; trig 181; hdl 34; hgb a1c 5.7     Review of Systems  Unable to perform ROS: patient nonverbal     Physical Exam  Constitutional: No distress.  Frail   Eyes: Conjunctivae are normal.  Neck: Neck supple. No JVD present. No thyromegaly present.  Cardiovascular: Regular rhythm, normal heart sounds and intact distal pulses.   Slightly tachycardic   Respiratory: Effort normal and breath sounds normal. No respiratory distress. She has no wheezes.  GI:  Soft. Bowel sounds are normal. She exhibits no distension. There is no tenderness.  Peg tube present   Musculoskeletal: She exhibits no edema.  Right hemiplegia   Lymphadenopathy:    She has no cervical adenopathy.  Neurological: She is alert.  Skin: Skin is warm and dry. She is not diaphoretic.  Psychiatric: She has a normal mood and affect.      ASSESSMENT/ PLAN:   1. Diabetes: hgb a1c is 5.7 (previous 5.5) ; is currently off medications; will continue to monitor her status.  ldl is 85   2. Seizure: no reports of seizure activity present: will continue keppra 500 mg twice daily and will monitor   3. 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  3. Hypertension:b/p120/68   will continue lopressor 125 mg twice daily and clonidine 0.1 mg twice daily norvasc 5 mg daily   4.  Dysphagia: no signs of aspiration present  her po intake is not adequate to maintain her body weight; she is dependent upon peg tube feedings.  5. SAH: is neurologically without change; has right hemiparesis; will monitor her status.   6. UTI/urine retention: will continue urecholine 10 mg every 8 hours  No longer requiring I/O cath.   7.  Weight loss:  Her weight is 142 pounds. will continue her current plan of care and will continue to monitor her status.    MD is aware of resident's narcotic use and is in agreement with current plan of care. We will attempt to wean resident as apropriate     Synthia Innocenteborah Kimble Hitchens NP [facility]  Contact [phone] Monday through Friday 8am- 5pm  After hours call [phone]

## 2016-08-02 ENCOUNTER — Encounter: Payer: Self-pay | Admitting: Internal Medicine

## 2016-08-02 ENCOUNTER — Non-Acute Institutional Stay (SKILLED_NURSING_FACILITY): Payer: Medicaid Other | Admitting: Internal Medicine

## 2016-08-02 DIAGNOSIS — M25432 Effusion, left wrist: Secondary | ICD-10-CM

## 2016-08-02 DIAGNOSIS — Z8673 Personal history of transient ischemic attack (TIA), and cerebral infarction without residual deficits: Secondary | ICD-10-CM | POA: Diagnosis not present

## 2016-08-02 DIAGNOSIS — M25532 Pain in left wrist: Secondary | ICD-10-CM

## 2016-08-02 DIAGNOSIS — R2232 Localized swelling, mass and lump, left upper limb: Secondary | ICD-10-CM

## 2016-08-02 DIAGNOSIS — I825Y2 Chronic embolism and thrombosis of unspecified deep veins of left proximal lower extremity: Secondary | ICD-10-CM | POA: Diagnosis not present

## 2016-08-02 DIAGNOSIS — I2782 Chronic pulmonary embolism: Secondary | ICD-10-CM

## 2016-08-02 NOTE — Progress Notes (Signed)
DATE:   August 02, 2016  Location:   [facility] Room Number: 204 A Place of Service: SNF (31)   Extended Emergency Contact Information Primary Emergency Contact: Enderle,Carlton  [place] Mobile Phone: [phone] Relation: Son Secondary Emergency Contact: Debroah Baller [place] Mobile Phone: [phone] Relation: Son  Gaffer Does Patient Have a Programmer, multimedia?: Yes, Type of Advance Directive: Out of facility DNR (pink MOST or yellow form), Pre-existing out of facility DNR order (yellow form or pink MOST form): Pink MOST form placed in chart (order not valid for inpatient use), Does patient want to make changes to medical advance directive?: No - Patient declined  Chief Complaint  Patient presents with  . Acute Visit    Swollen Left Hand    HPI:  51 yo female long term resident seen today for left wrist/hand swelling noted today by nursing. She has pain with movement of fingers. No CP, SOB or falls. No f/c. She is on lovenox BID for PE. She is a poor historian due to expressive aphasia. Hx obtained from chart. She gets TF via Peg. CBGs 90-120s  DM - diet controlled. Hgb A1c 5.7% (previous 5.5). 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  Hypertension - stable on lopressor 125 mg twice daily (100+25mg  BID) and clonidine 0.1 mg twice daily; norvasc 5 mg daily   Dysphagia - s/p PEG as her po intake is not adequate to maintain her body weight; she is dependent upon TF  Hx SAH - stable. She has right hemiparesis and expressive aphasia    Past Medical History:  Diagnosis Date  . Acute pulmonary embolism (HCC) 02/19/2015  . Acute respiratory failure (HCC)   . Diabetes mellitus without complication (HCC)     Type 2, W/o complications  . DVT (deep venous thrombosis) (HCC) 04/05/2015  . Dysphagia   . Epilepsy (HCC)   . GERD (gastroesophageal reflux disease)   . Hyperlipidemia   . Hypertension   . IBS (irritable bowel syndrome)   . Nontraumatic subarachnoid hemorrhage (HCC)   . SAH (subarachnoid hemorrhage) (HCC)   . Urinary retention     Past Surgical History:  Procedure Laterality Date  . ABDOMINAL SURGERY    . ANEURYSM COILING    . COLONOSCOPY N/A 02/20/2015   Procedure: COLONOSCOPY;  Surgeon: Iva Boop, MD;  Location: [facility] ENDOSCOPY;  Service: Endoscopy;  Laterality: N/A;  . ESOPHAGOGASTRODUODENOSCOPY (EGD) WITH PROPOFOL N/A 02/02/2015   Procedure: ESOPHAGOGASTRODUODENOSCOPY (EGD) WITH PROPOFOL;  Surgeon: Jimmye Norman, MD;  Location: [facility] ENDOSCOPY;  Service: General;  Laterality: N/A;  . IR GASTRIC TUBE PERC CHG W/O IMG GUIDE  06/08/2016  . IR GENERIC HISTORICAL  08/26/2015   IR GASTRIC TUBE PERC CHG W/O IMG GUIDE 08/26/2015 Irish Lack, MD [facility]INTERV RAD  . IR GENERIC HISTORICAL  09/14/2015   IR REPLC GASTRO/COLONIC TUBE PERCUT W/FLUORO 09/14/2015 Darrell K Allred, PA-C [facility]INTERV RAD  . PEG PLACEMENT N/A 02/02/2015   Procedure: PERCUTANEOUS ENDOSCOPIC GASTROSTOMY (PEG) PLACEMENT;  Surgeon: Jimmye Norman, MD;  Location: [facility] ENDOSCOPY;  Service: General;  Laterality: N/A;  . RADIOLOGY WITH ANESTHESIA N/A 01/13/2015   Procedure: RADIOLOGY WITH ANESTHESIA;  Surgeon: Lisbeth Renshaw, MD;  Location: [facility] OR;  Service: Radiology;  Laterality: N/A;  .  TRACHEOSTOMY      Patient Care Team: Kirt Boysarter, Teniyah Seivert, DO as PCP - General (Internal Medicine) Chilton SiGreen, Chong Sicilianeborah S, NP as Nurse Practitioner (Geriatric Medicine) Center[facility] (Skilled Nursing Facility)  Social History   Social History  . Marital status: Single    Spouse name: N/A  . Number of children: N/A  . Years of education: N/A   Occupational History  . Not on file.   Social History Main Topics  . Smoking status: Former  Games developermoker  . Smokeless tobacco: Never Used  . Alcohol use No  . Drug use: No  . Sexual activity: Not on file   Other Topics Concern  . Not on file   Social History Narrative  . No narrative on file     reports that she has quit smoking. She has never used smokeless tobacco. She reports that she does not drink alcohol or use drugs.  History reviewed. No pertinent family history. No family status information on file.    Immunization History  Administered Date(s) Administered  . Influenza-Unspecified 03/09/2015, 11/09/2015  . PPD Test 10/07/2015, 10/14/2015    No Known Allergies  Medications: Patient's Medications  New Prescriptions   No medications on file  Previous Medications   ACETAMINOPHEN (TYLENOL) 325 MG TABLET    Place 650 mg into feeding tube every 4 (four) hours as needed for mild pain or fever.    AMINO ACIDS-PROTEIN HYDROLYS (FEEDING SUPPLEMENT, PRO-STAT SUGAR FREE 64,) LIQD    Place 60 mLs into feeding tube 3 (three) times daily with meals.    AMLODIPINE (NORVASC) 5 MG TABLET    Place 5 mg into feeding tube daily.    BETHANECHOL (URECHOLINE) 10 MG TABLET    10 mg by PEG Tube route every 8 (eight) hours.    CLONIDINE (CATAPRES) 0.1 MG TABLET    Place 0.1 mg into feeding tube 2 (two) times daily.    ENOXAPARIN (LOVENOX) 60 MG/0.6ML INJECTION    Inject 50 mg into the skin 2 (two) times daily.    HYDROCORTISONE CREAM 1 %    Apply 1 application topically 3 (three) times daily. Apply to abdomen for rash   LEVETIRACETAM (KEPPRA) 100 MG/ML SOLUTION    Place 5 mLs (500 mg total) into feeding tube 2 (two) times daily.   METOPROLOL (LOPRESSOR) 100 MG TABLET    Place 100 mg into feeding tube 2 (two) times daily.   METOPROLOL TARTRATE (LOPRESSOR) 25 MG TABLET    Place 25 mg into feeding tube 2 (two) times daily.   MULTIPLE VITAMINS-MINERALS (DECUBI-VITE) CAPS    Place 1 capsule into feeding tube daily.    NUTRITIONAL SUPPLEMENTS (FEEDING SUPPLEMENT, JEVITY 1.2 CAL,) LIQD    Give  5480ml/hr x 16 hours from 4:00 PM to 8:00 AM   POLYETHYLENE GLYCOL (MIRALAX / GLYCOLAX) PACKET    Place 17 g into feeding tube daily.   SKIN PROTECTANTS, MISC. (CALAZIME SKIN PROTECTANT EX)    Apply 1 application topically 2 (two) times daily. Apply to buttocks and left groin for irritation   UNABLE TO FIND    HSG Puree diet - HSG Puree texture, Regular consistency, may have pleasure finger foods with supervision   UNABLE TO FIND    Enternal Feet Order - Give 200 ml if water via G-Tube every 4 hours   WATER FOR INJECTION STERILE IJ    Place 20 mLs into feeding tube every hour.  Modified Medications   No medications on file  Discontinued Medications  No medications on file    Review of Systems  Unable to perform ROS: Other (expressive aphasia)    Vitals:   08/02/16 1159  BP: 124/68  Pulse: 76  Resp: 18  Temp: 98 F (36.7 C)  SpO2: 97%  Weight: 107 lb 12.8 oz (48.9 kg)  Height: 5\' 5"  (1.651 m)   Body mass index is 17.94 kg/m.  Physical Exam  Constitutional: She appears well-developed.  Frail appearing in NAD, sitting up in bed  Neck: Neck supple.  Cardiovascular: Normal rate, regular rhythm and intact distal pulses.  Exam reveals no gallop and no friction rub.   Murmur (1/6 SEM) heard. Distal left forearm/hand swelling with reduced supination and flexion/extension of wrist; left anterior wrist TTP over median nerve with vessel engorgement; no TTP over bone; no increased warmth to touch; left fingers x5 flexedat PIP and DIP joints and pt experiences pain with passive movement; ulnar and radial pulses palpable; FROM at elbow joint b/l; poor left grip strength;   Pulmonary/Chest: Effort normal and breath sounds normal. No respiratory distress. She has no wheezes. She has no rales. She exhibits no tenderness.  Abdominal: Soft. Bowel sounds are normal. She exhibits no distension and no mass. There is no tenderness. There is no rebound and no guarding.  Peg tube intact and clamped with  no d/c, redness or swelling at insertion site  Musculoskeletal: She exhibits edema.  Lymphadenopathy:    She has no cervical adenopathy.  Neurological: She is alert.  Skin: Skin is warm and dry. No rash noted.  Psychiatric: She has a normal mood and affect. Her behavior is normal. Thought content normal.     Labs reviewed: Nursing Home on 06/19/2016  Component Date Value Ref Range Status  . Hemoglobin 06/14/2016 11.9* 12.0 - 16.0 g/dL Final  . HCT 53/66/4403 37  36 - 46 % Final  . Neutrophils Absolute 06/14/2016 5  /L Final  . WBC 06/14/2016 8.4  10^3/mL Final  . Glucose 06/14/2016 134  mg/dL Final  . BUN 47/42/5956 31* 4 - 21 mg/dL Final  . Creatinine 38/75/6433 0.6  0.5 - 1.1 mg/dL Final  . Potassium 29/51/8841 4.5  3.4 - 5.3 mmol/L Final  . Sodium 06/14/2016 149* 137 - 147 mmol/L Final  . Triglycerides 06/14/2016 181* 40 - 160 mg/dL Final  . Cholesterol 66/06/3014 155  0 - 200 mg/dL Final  . HDL 02/06/3233 34* 35 - 70 mg/dL Final  . LDL Cholesterol 06/14/2016 85  mg/dL Final  . Alkaline Phosphatase 06/14/2016 118  25 - 125 U/L Final  . ALT 06/14/2016 11  7 - 35 U/L Final  . AST 06/14/2016 12* 13 - 35 U/L Final  . Bilirubin, Total 06/14/2016 4.2  mg/dL Final  . Vitamin T-73 22/03/5425 521   Final  Abstract on 06/15/2016  Component Date Value Ref Range Status  . HM Mammogram 06/14/2016 0-4 Bi-Rad  0-4 Bi-Rad, Self Reported Normal Final   [facility]  Abstract on 05/04/2016  Component Date Value Ref Range Status  . Hemoglobin 04/26/2016 12.6  12.0 - 16.0 g/dL Final  . HCT 07/22/7626 38  36 - 46 % Final  . Platelets 04/26/2016 231  150 - 399 K/L Final  . WBC 04/26/2016 6.9  10^3/mL Final  . Glucose 04/26/2016 94  mg/dL Final  . BUN 31/51/7616 10  4 - 21 mg/dL Final  . Creatinine 07/37/1062 0.7  0.5 - 1.1 mg/dL Final  . Potassium 69/48/5462 4.2  3.4 - 5.3 mmol/L Final  .  Sodium 04/26/2016 142  137 - 147 mmol/L Final  . Alkaline Phosphatase 04/26/2016 85  25 - 125 U/L Final  .  ALT 04/26/2016 9  7 - 35 U/L Final  . AST 04/26/2016 11* 13 - 35 U/L Final  Abstract on 05/04/2016  Component Date Value Ref Range Status  . Triglycerides 04/26/2016 101  40 - 160 mg/dL Final  . Cholesterol 11/91/4782 177  0 - 200 mg/dL Final  . HDL 95/62/1308 33* 35 - 70 mg/dL Final  . LDL Cholesterol 04/26/2016 124  mg/dL Final    No results found.   Assessment/Plan   ICD-10-CM   1. Localized swelling on left hand R22.32   2. Pain and swelling of left wrist M25.532    M25.432   3. Other chronic pulmonary embolism without acute cor pulmonale (HCC) I27.82   4. Chronic deep vein thrombosis (DVT) of proximal vein of left lower extremity (HCC) I82.5Y2   5. History of stroke Z86.73     Check left wrist/hand xray  Check STAT LUE venous doppler US to r/o DVT  Cont other meds as ordered  Will follow   Nakeesha Bowler S. Ancil Linsey  [facility] and Adult Medicine [address] [place], [place] [address] [phone] Cell (Monday-Friday 8 AM - 5 PM) [phone] After 5 PM and follow prompts

## 2016-08-08 ENCOUNTER — Other Ambulatory Visit: Payer: Self-pay | Admitting: Adult Health

## 2016-08-08 DIAGNOSIS — R634 Abnormal weight loss: Secondary | ICD-10-CM

## 2016-08-10 ENCOUNTER — Inpatient Hospital Stay: Admission: RE | Admit: 2016-08-10 | Payer: Medicaid Other | Source: Ambulatory Visit

## 2016-08-10 ENCOUNTER — Other Ambulatory Visit: Payer: Medicaid Other

## 2016-08-16 ENCOUNTER — Encounter: Payer: Self-pay | Admitting: Adult Health

## 2016-08-16 ENCOUNTER — Non-Acute Institutional Stay (SKILLED_NURSING_FACILITY): Payer: Medicaid Other | Admitting: Internal Medicine

## 2016-08-16 ENCOUNTER — Encounter: Payer: Self-pay | Admitting: Internal Medicine

## 2016-08-16 DIAGNOSIS — Z8673 Personal history of transient ischemic attack (TIA), and cerebral infarction without residual deficits: Secondary | ICD-10-CM | POA: Diagnosis not present

## 2016-08-16 DIAGNOSIS — M25432 Effusion, left wrist: Secondary | ICD-10-CM | POA: Insufficient documentation

## 2016-08-16 DIAGNOSIS — R2232 Localized swelling, mass and lump, left upper limb: Secondary | ICD-10-CM

## 2016-08-16 DIAGNOSIS — M25532 Pain in left wrist: Secondary | ICD-10-CM | POA: Diagnosis not present

## 2016-08-16 DIAGNOSIS — M659 Synovitis and tenosynovitis, unspecified: Secondary | ICD-10-CM | POA: Diagnosis not present

## 2016-08-16 NOTE — Progress Notes (Signed)
Entered in error

## 2016-08-16 NOTE — Progress Notes (Signed)
DATE:  August 16, 2016  Location:   [facility] Room Number: 204 A Place of Service: SNF (31)   Extended Emergency Contact Information Primary Emergency Contact: Swaby,Carlton  [place] Mobile Phone: [phone] Relation: Son Secondary Emergency Contact: Debroah Baller [place] Mobile Phone: [phone] Relation: Son  Gaffer Does Patient Have a Programmer, multimedia?: Yes, Type of Advance Directive: Out of facility DNR (pink MOST or yellow form), Pre-existing out of facility DNR order (yellow form or pink MOST form): Pink MOST form placed in chart (order not valid for inpatient use), Does patient want to make changes to medical advance directive?: No - Patient declined  Chief Complaint  Patient presents with  . Acute Visit    Left hand swelling and pain    HPI:  51 yo female long term resident seen today for left hand pain and swelling x 2 weeks. She was seen on 7/5th for similar reason. Her LUE US doppler was neg for DVT. Left hand xray on 08/15/16 shows mild OA but no acute process. She reports pain and inability to fully open hand and extend fingers. Nursing noted left lateral thumb blood blister yesterday. She has a hx stroke with right hemiparesis/expressive aphasia. 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 She is not a xeralto/coumadin candidate. She is a poor historian due to expressive aphasia. Hx obtained from chart.   Past Medical History:  Diagnosis Date  . Acute pulmonary embolism (HCC) 02/19/2015  . Acute respiratory failure (HCC)   . Diabetes mellitus without complication (HCC)    Type 2, W/o complications  . DVT (deep venous thrombosis) (HCC) 04/05/2015  . Dysphagia   . Epilepsy (HCC)   . GERD (gastroesophageal reflux disease)   . Hyperlipidemia   . Hypertension   . IBS (irritable bowel syndrome)   . Nontraumatic subarachnoid hemorrhage (HCC)   . SAH (subarachnoid hemorrhage)  (HCC)   . Urinary retention     Past Surgical History:  Procedure Laterality Date  . ABDOMINAL SURGERY    . ANEURYSM COILING    . COLONOSCOPY N/A 02/20/2015   Procedure: COLONOSCOPY;  Surgeon: Iva Boop, MD;  Location: [facility] ENDOSCOPY;  Service: Endoscopy;  Laterality: N/A;  . ESOPHAGOGASTRODUODENOSCOPY (EGD) WITH PROPOFOL N/A 02/02/2015   Procedure: ESOPHAGOGASTRODUODENOSCOPY (EGD) WITH PROPOFOL;  Surgeon: Jimmye Norman, MD;  Location: [facility] ENDOSCOPY;  Service: General;  Laterality: N/A;  . IR GASTRIC TUBE PERC CHG W/O IMG GUIDE  06/08/2016  . IR GENERIC HISTORICAL  08/26/2015   IR GASTRIC TUBE PERC CHG W/O IMG GUIDE 08/26/2015 Irish Lack, MD [facility]  . IR GENERIC HISTORICAL  09/14/2015   IR REPLC GASTRO/COLONIC TUBE PERCUT W/FLUORO 09/14/2015 Darrell K Allred, PA-C [facility]  . PEG PLACEMENT N/A 02/02/2015   Procedure: PERCUTANEOUS ENDOSCOPIC GASTROSTOMY (PEG) PLACEMENT;  Surgeon: Jimmye Norman, MD;  Location: [facility] ENDOSCOPY;  Service: General;  Laterality: N/A;  . RADIOLOGY WITH ANESTHESIA N/A 01/13/2015   Procedure: RADIOLOGY WITH ANESTHESIA;  Surgeon: Lisbeth Renshaw, MD;  Location: [facility] OR;  Service: Radiology;  Laterality: N/A;  . TRACHEOSTOMY      Patient Care Team: Kirt Boys, DO as PCP - General (Internal Medicine) Chilton Si Chong Sicilian, NP as Nurse Practitioner (Geriatric Medicine) [facility] (Sk[facility] Nursing Facility)  Social History   Social History  . Marital status: Single    Spouse name: N/A  . Number of children: N/A  . Years of education: N/A   Occupational  History  . Not on file.   Social History Main Topics  . Smoking status: Former Games developermoker  . Smokeless tobacco: Never Used  . Alcohol use No  . Drug use: No  . Sexual activity: Not on file   Other Topics Concern  . Not on file   Social History Narrative  . No narrative on file     reports that she has quit smoking. She has never used smokeless tobacco. She reports that she does not  drink alcohol or use drugs.  History reviewed. No pertinent family history. No family status information on file.    Immunization History  Administered Date(s) Administered  . Influenza-Unspecified 03/09/2015, 11/09/2015  . PPD Test 10/07/2015, 10/14/2015    No Known Allergies  Medications: Patient's Medications  New Prescriptions   No medications on file  Previous Medications   ACETAMINOPHEN (TYLENOL) 325 MG TABLET    Place 650 mg into feeding tube every 4 (four) hours as needed for mild pain or fever.    AMINO ACIDS-PROTEIN HYDROLYS (FEEDING SUPPLEMENT, PRO-STAT SUGAR FREE 64,) LIQD    Place 60 mLs into feeding tube 3 (three) times daily with meals.    AMLODIPINE (NORVASC) 5 MG TABLET    Place 5 mg into feeding tube daily.    BETHANECHOL (URECHOLINE) 10 MG TABLET    10 mg by PEG Tube route every 8 (eight) hours.    CLONIDINE (CATAPRES) 0.1 MG TABLET    Place 0.1 mg into feeding tube 2 (two) times daily.    ENOXAPARIN (LOVENOX) 60 MG/0.6ML INJECTION    Inject 50 mg into the skin 2 (two) times daily.    HYDROCORTISONE CREAM 1 %    Apply 1 application topically 3 (three) times daily. Apply to abdomen for rash   LEVETIRACETAM (KEPPRA) 100 MG/ML SOLUTION    Place 5 mLs (500 mg total) into feeding tube 2 (two) times daily.   METOPROLOL (LOPRESSOR) 100 MG TABLET    Place 100 mg into feeding tube 2 (two) times daily.   METOPROLOL TARTRATE (LOPRESSOR) 25 MG TABLET    Place 25 mg into feeding tube 2 (two) times daily.   MULTIPLE VITAMINS-MINERALS (DECUBI-VITE) CAPS    Place 1 capsule into feeding tube daily.    NUTRITIONAL SUPPLEMENTS (FEEDING SUPPLEMENT, JEVITY 1.2 CAL,) LIQD    Give 580ml/hr x 16 hours from 4:00 PM to 8:00 AM   POLYETHYLENE GLYCOL (MIRALAX / GLYCOLAX) PACKET    Place 17 g into feeding tube daily.   SKIN PROTECTANTS, MISC. (CALAZIME SKIN PROTECTANT EX)    Apply 1 application topically 2 (two) times daily. Apply to buttocks and left groin for irritation   UNABLE TO FIND     HSG Puree diet - HSG Puree texture, Regular consistency, may have pleasure finger foods with supervision   UNABLE TO FIND    Enternal Feet Order - Give 200 ml if water via G-Tube every 4 hours   WATER FOR INJECTION STERILE IJ    Place 30-60 mLs into feeding tube. Flush with 30-60 ml water before and after meds, before initiating feedings or when there is an interruption of feeding to maintain patency.  Also flush every shift with 5-10 ml water between each medication  Modified Medications   No medications on file  Discontinued Medications   No medications on file    Review of Systems  Unable to perform ROS: Other (expressive aphasia)    Vitals:   08/16/16 1156  BP: 123/68  Pulse: 66  Resp: 18  Temp: 98 F (36.7 C)  SpO2: 99%  Weight: 113 lb 3.2 oz (51.3 kg)  Height: 5\' 5"  (1.651 m)   Body mass index is 18.84 kg/m.  Physical Exam  Constitutional: She appears well-developed and well-nourished.  Musculoskeletal: She exhibits edema and tenderness.  Left hand with flexed MCP, PIP joints and TTP with active> passive extension; (+) Finklestein Test; poor grip strength; posterior wrist swelling with reduced flexion and hypertrophy of extensor retinaculum; radial/ulnar pulses intact; supination/pronation intact  Neurological: She is alert.  Skin: Skin is warm and dry. No rash noted.  Psychiatric: She has a normal mood and affect. Her behavior is normal.     Labs reviewed: Nursing Home on 06/19/2016  Component Date Value Ref Range Status  . Hemoglobin 06/14/2016 11.9* 12.0 - 16.0 g/dL Final  . HCT 16/10/9602 37  36 - 46 % Final  . Neutrophils Absolute 06/14/2016 5  /L Final  . WBC 06/14/2016 8.4  10^3/mL Final  . Glucose 06/14/2016 134  mg/dL Final  . BUN 54/09/8117 31* 4 - 21 mg/dL Final  . Creatinine 14/78/2956 0.6  0.5 - 1.1 mg/dL Final  . Potassium 21/30/8657 4.5  3.4 - 5.3 mmol/L Final  . Sodium 06/14/2016 149* 137 - 147 mmol/L Final  . Triglycerides 06/14/2016 181* 40 -  160 mg/dL Final  . Cholesterol 84/69/6295 155  0 - 200 mg/dL Final  . HDL 28/41/3244 34* 35 - 70 mg/dL Final  . LDL Cholesterol 06/14/2016 85  mg/dL Final  . Alkaline Phosphatase 06/14/2016 118  25 - 125 U/L Final  . ALT 06/14/2016 11  7 - 35 U/L Final  . AST 06/14/2016 12* 13 - 35 U/L Final  . Bilirubin, Total 06/14/2016 4.2  mg/dL Final  . Vitamin W-10 27/25/3664 521   Final  Abstract on 06/15/2016  Component Date Value Ref Range Status  . HM Mammogram 06/14/2016 0-4 Bi-Rad  0-4 Bi-Rad, Self Reported Normal Final   [facility]    No results found.   Assessment/Plan   ICD-10-CM   1. Tenosynovitis of finger and hand M65.9   2. Pain and swelling of left wrist M25.532    M25.432   3. Localized swelling on left hand R22.32   4. History of stroke Z86.73    with right hemiparesis    Refer to Ortho hand specialist for further eval  Cont pain control as ordered  Will follow  Klarissa Mcilvain S. Ancil Linsey  [facility] and Adult Medicine [address] [place], [place] [address] [phone] Cell (Monday-Friday 8 AM - 5 PM) [phone] After 5 PM and follow prompts

## 2016-08-20 ENCOUNTER — Non-Acute Institutional Stay (SKILLED_NURSING_FACILITY): Payer: Medicaid Other | Admitting: Adult Health

## 2016-08-20 ENCOUNTER — Encounter: Payer: Self-pay | Admitting: Adult Health

## 2016-08-20 DIAGNOSIS — I825Y2 Chronic embolism and thrombosis of unspecified deep veins of left proximal lower extremity: Secondary | ICD-10-CM | POA: Diagnosis not present

## 2016-08-20 DIAGNOSIS — I2782 Chronic pulmonary embolism: Secondary | ICD-10-CM

## 2016-08-20 DIAGNOSIS — R1314 Dysphagia, pharyngoesophageal phase: Secondary | ICD-10-CM | POA: Diagnosis not present

## 2016-08-20 DIAGNOSIS — I10 Essential (primary) hypertension: Secondary | ICD-10-CM | POA: Diagnosis not present

## 2016-08-20 DIAGNOSIS — R569 Unspecified convulsions: Secondary | ICD-10-CM | POA: Diagnosis not present

## 2016-08-20 DIAGNOSIS — E1149 Type 2 diabetes mellitus with other diabetic neurological complication: Secondary | ICD-10-CM | POA: Diagnosis not present

## 2016-08-20 NOTE — Progress Notes (Signed)
Location:   [facility] Room Number: 204 A Place of Service:      CODE STATUS: Full Code  No Known Allergies  Chief Complaint  Patient presents with  . Medical Management of Chronic Issues    1 month follow up    HPI:  She is a 51 year old long term resident of this facility being seen for the management of her chronic illnesses diabetes; seizure; pe; dvt; hypertension; dysphagia . She has been losing weight. She is awaiting to see hand specialist for her left hand pain. She does get out of bed daily. She has been refusing her peg tube feedings at times. She is unable to fully participate in the hpi or ros.    Past Medical History:  Diagnosis Date  . Acute pulmonary embolism (HCC) 02/19/2015  . Acute respiratory failure (HCC)   . Diabetes mellitus without complication (HCC)    Type 2, W/o complications  . DVT (deep venous thrombosis) (HCC) 04/05/2015  . Dysphagia   . Epilepsy (HCC)   . GERD (gastroesophageal reflux disease)   . Hyperlipidemia   . Hypertension   . IBS (irritable bowel syndrome)   . Nontraumatic subarachnoid hemorrhage (HCC)   . SAH (subarachnoid hemorrhage) (HCC)   . Urinary retention     Past Surgical History:  Procedure Laterality Date  . ABDOMINAL SURGERY    . ANEURYSM COILING    . COLONOSCOPY N/A 02/20/2015   Procedure: COLONOSCOPY;  Surgeon: Iva Booparl E Gessner, MD;  [facility];  Service: Endoscopy;  Laterality: N/A;  . ESOPHAGOGASTRODUODENOSCOPY (EGD) WITH PROPOFOL N/A 02/02/2015   Procedure: ESOPHAGOGASTRODUODENOSCOPY (EGD) WITH PROPOFOL;  Surgeon: Jimmye NormanJames Wyatt, MD;  [facility];  Service: General;  Laterality: N/A;  . IR GASTRIC TUBE PERC CHG W/O IMG GUIDE  06/08/2016  . IR GENERIC HISTORICAL  08/26/2015   IR GASTRIC TUBE PERC CHG W/O IMG GUIDE 08/26/2015 Irish LackGlenn Yamagata, MD [facility]INTER[facility]  . IR GENERIC HISTORICAL  09/14/2015   IR REPLC GASTRO/COLONIC TUBE PERCUT W/FLUORO 09/14/2015 Darrell K Allred, PA-C [facility]  . PEG  PLACEMENT N/A 02/02/2015   Procedure: PERCUTANEOUS ENDOSCOPIC GASTROSTOMY (PEG) PLACEMENT;  Surgeon: Jimmye NormanJames Wyatt, MD;  [facility];  Service: General;  Laterality: N/A;  . RADIOLOGY WITH ANESTHESIA N/A 01/13/2015   Procedure: RADIOLOGY WITH ANESTHESIA;  Surgeon: Lisbeth RenshawNeelesh Nundkumar, MD;  Location: [facility] OR;  Service: Radiology;  Laterality: N/A;  . TRACHEOSTOMY      Social History   Social History  . Marital status: Single    Spouse name: N/A  . Number of children: N/A  . Years of education: N/A   Occupational History  . Not on file.   Social History Main Topics  . Smoking status: Former Games developermoker  . Smokeless tobacco: Never Used  . Alcohol use No  . Drug use: No  . Sexual activity: Not on file   Other Topics Concern  . Not on file   Social History Narrative  . No narrative on file   History reviewed. No pertinent family history.    VITAL SIGNS BP 123/68   Pulse 66   Temp 98 F (36.7 C)   Resp 18   Ht 5' (1.524 m)   Wt 113 lb 3.2 oz (51.3 kg)   LMP  (LMP Unknown)   SpO2 99%   BMI 22.11 kg/m   Patient's Medications  New Prescriptions   No medications on file  Previous Medications   ACETAMINOPHEN (TYLENOL) 325 MG TABLET  Place 650 mg into feeding tube every 4 (four) hours as needed for mild pain or fever.    AMINO ACIDS-PROTEIN HYDROLYS (FEEDING SUPPLEMENT, PRO-STAT SUGAR FREE 64,) LIQD    Place 60 mLs into feeding tube 3 (three) times daily with meals.    AMLODIPINE (NORVASC) 5 MG TABLET    Place 5 mg into feeding tube daily.    BETHANECHOL (URECHOLINE) 10 MG TABLET    10 mg by PEG Tube route every 8 (eight) hours.    CLONIDINE (CATAPRES) 0.1 MG TABLET    Place 0.1 mg into feeding tube 2 (two) times daily.    ENOXAPARIN (LOVENOX) 60 MG/0.6ML INJECTION    Inject 50 mg into the skin 2 (two) times daily.    HYDROCORTISONE CREAM 1 %    Apply 1 application topically 3 (three) times daily. Apply to abdomen for rash   LEVETIRACETAM (KEPPRA) 100 MG/ML SOLUTION     Place 5 mLs (500 mg total) into feeding tube 2 (two) times daily.   METOPROLOL (LOPRESSOR) 100 MG TABLET    Place 100 mg into feeding tube 2 (two) times daily.   METOPROLOL TARTRATE (LOPRESSOR) 25 MG TABLET    Place 25 mg into feeding tube 2 (two) times daily.   MULTIPLE VITAMINS-MINERALS (DECUBI-VITE) CAPS    Place 1 capsule into feeding tube daily.    NUTRITIONAL SUPPLEMENTS (FEEDING SUPPLEMENT, JEVITY 1.2 CAL,) LIQD    Give 16ml/hr x 16 hours from 4:00 PM to 8:00 AM   OXYCODONE (OXY IR/ROXICODONE) 5 MG IMMEDIATE RELEASE TABLET    Give 1 tablet via PEG-Tube every 4-8 hours as needed for pain   POLYETHYLENE GLYCOL (MIRALAX / GLYCOLAX) PACKET    Place 17 g into feeding tube daily.   SKIN PROTECTANTS, MISC. (CALAZIME SKIN PROTECTANT EX)    Apply 1 application topically 2 (two) times daily. Apply to buttocks and left groin for irritation   UNABLE TO FIND    HSG Puree diet - HSG Puree texture, Regular consistency, may have pleasure finger foods with supervision   UNABLE TO FIND    Enternal Feet Order - Give 200 ml if water via G-Tube every 4 hours   WATER FOR INJECTION STERILE IJ    Place 30-60 mLs into feeding tube. Flush with 30-60 ml water before and after meds, before initiating feedings or when there is an interruption of feeding to maintain patency.  Also flush every shift with 5-10 ml water between each medication  Modified Medications   No medications on file  Discontinued Medications   No medications on file     SIGNIFICANT DIAGNOSTIC EXAMS  PREVIOUS  02-17-15: ct angio of chest: 1. Extensive pulmonary emboli involving distal main pulmonary arteries extending into lobar, segmental and subsegmental sized branches throughout the lungs bilaterally. At this time, there are no overt findings to suggest right heart strain. 2. Cardiomegaly with left ventricular concentric hypertrophy. 3. Dependent atelectasis throughout the lower lobes of the lungs bilaterally.  02-18-15: bilateral lower  extremity doppler: Findings consistent with acute deep vein thrombosis involving the left common femoral vein, left proximal profunda femoris vein, and left proximal femoral vein. Incidental findings are consistent with: enlarged lymph node on the right. - No evidence of deep vein thrombosis involving the right lower extremity. - No evidence of Baker&'s cyst on the right or left.  02-19-15: pelvic ultrasound: 1. Uterus not identified, presumed hysterectomy. No mass or free fluid seen within the midline pelvis. 2. Neither ovary is seen, perhaps bilateral oophorectomies,  perhaps obscured by the fairly prominent fluid-filled bowel loops in the pelvis. No mass or free fluid seen within either adnexal region.  03-15-15: chest x-ray: 1. Tracheostomy tube is newly absent. No pneumomediastinum or pneumothorax. 2. Stable mild enlargement of the cardiopericardial silhouette. 3. Linear subsegmental atelectasis in the left lower lobe.  03-16-15: chest x-ray: Lungs clear.  Heart prominent but stable  06-14-16: diagnostic right mammogram: benign cyst  TODAY  08-02-16: left upper extremity doppler: negative for dvt  08-02-16: left hand x-ray: mild osteoarthritis   08-15-16: left hand x-ray: mild osteoarthritis no change    LABS REVIEWED: PREVIOUS   08-06-15: urine culture; e-coli: ESBL: imipenem   hgb a1c 5.5; urine micro-albumin 144.1 08-13-15: wbc 6.7 hgb 10.6; hct 35.9; mcv 89.8; plt 330; glucose 121; bun 16.7; creat 0.67; k+ 4.0; na++ 143; liver normal albumin 3.5   11-02-15: wbc 8.2; hgb 11.8; hct 37.3; mcv 89.3; plt 220; glucose 109; bun 16.6; creat 0.88; k+ 5.2; na++ 136 11-23-15: wbc 6.3; hgb 13.8; hct 43.9; mcv 96.8; plt 225; glucose 92; bun 27.6; creat 0.86; k+ 4.3; na++ 148  04-26-16: wbc 6.9; hgb 12.;6 hct 38.0; mcv 94.4; plt 231; glucose 94; bun 10.0; creat 0.65; k+ 4.2; na++ 142; liver normal albumin 4.2; chol 177; lld 124; trig 101; hdl 33  06-14-16: wbc 8.4; hgb 11.9; hct 36.6; mcv 95.4; plt 203;  glucose 134; bun 30.7; creat 0.62; k+ 4.5 ;na++ 149; liver normal albumin 4.2; vit B 12: 521; folate 19.1; chol 155; ldl 85; trig 181; hdl 34; hgb a1c 5.7   NO NEW LABS     Review of Systems  Unable to perform ROS: Other (aphasia )     Physical Exam  Constitutional: No distress.  Frail   Eyes: Conjunctivae are normal.  Neck: Neck supple. No JVD present. No thyromegaly present.  Cardiovascular: Normal rate, regular rhythm and intact distal pulses.   Respiratory: Effort normal and breath sounds normal. No respiratory distress. She has no wheezes.  GI: Soft. Bowel sounds are normal. She exhibits no distension. There is no tenderness.  Peg tube present   Musculoskeletal: She exhibits no edema.  Able to move left  extremities  Has right hemiplegia   Lymphadenopathy:    She has no cervical adenopathy.  Neurological: She is alert.  Skin: Skin is warm and dry. She is not diaphoretic.  Psychiatric: She has a normal mood and affect.     ASSESSMENT/ PLAN:  TODAY  1. Diabetes: is stable  hgb a1c is 5.7 (previous 5.5) ; is currently off medications; will continue to monitor her status.  ldl is 85   2. Seizure: is stable  no reports of seizure activity present: will continue keppra 500 mg twice daily and will monitor   3. DVT/PE: is stable  she does require long term anticoagulation therapy; will continue lovenox 50  mg twice daily (weigt based); is not a candidate for xarelto; or eliquis due to her history of GI bleed; she is not appropriate for coumadin therapy; unable  to obtain adequate INR.  3. Hypertension: stable b/p123/68   will continue lopressor 125 mg twice daily and clonidine 0.1 mg twice daily norvasc 5 mg daily   4.  Dysphagia: stable  no signs of aspiration present  her po intake is not adequate to maintain her body weight; she is dependent upon peg tube feeding.  PREVIOUS  5. SAH: is neurologically without change; has right hemiparesis; will monitor her status.   6.  UTI/urine retention: will continue urecholine 10 mg every 8 hours  No longer requiring I/O cath.   7.  Weight loss:  Her weight is 113 pounds. will continue her current plan of care and will continue to monitor her status.      Synthia Innocent NP [facility]  Contact [phone] Monday through Friday 8am- 5pm  After hours call [phone]

## 2016-08-21 ENCOUNTER — Encounter: Payer: Self-pay | Admitting: Adult Health

## 2016-08-21 ENCOUNTER — Non-Acute Institutional Stay (SKILLED_NURSING_FACILITY): Payer: Medicaid Other | Admitting: Adult Health

## 2016-08-21 DIAGNOSIS — I639 Cerebral infarction, unspecified: Secondary | ICD-10-CM

## 2016-08-21 DIAGNOSIS — K219 Gastro-esophageal reflux disease without esophagitis: Secondary | ICD-10-CM | POA: Diagnosis not present

## 2016-08-21 DIAGNOSIS — R634 Abnormal weight loss: Secondary | ICD-10-CM | POA: Diagnosis not present

## 2016-08-21 DIAGNOSIS — E43 Unspecified severe protein-calorie malnutrition: Secondary | ICD-10-CM | POA: Diagnosis not present

## 2016-08-21 NOTE — Progress Notes (Signed)
Location:   [facility] Room Number: 204 A Place of Service:  SNF (31)   CODE STATUS: Full Code  No Known Allergies  Chief Complaint  Patient presents with  . Acute Visit    Care Plan Meeting    HPI:  We have all come together to discuss her overall status. She has been declining her tube feeds and is not eating orally. She is telling that there is pain present and nausea. We are here to discuss ways to improve her feeding situation. She does not want the tube feedings stopped. We are going to look into different formulations with dietary to see if one them could be better tolerated. She does indicate that she is having mouth pain. Her family is interested in a palliative care.    Past Medical History:  Diagnosis Date  . Acute pulmonary embolism (HCC) 02/19/2015  . Acute respiratory failure (HCC)   . Diabetes mellitus without complication (HCC)    Type 2, W/o complications  . DVT (deep venous thrombosis) (HCC) 04/05/2015  . Dysphagia   . Epilepsy (HCC)   . GERD (gastroesophageal reflux disease)   . Hyperlipidemia   . Hypertension   . IBS (irritable bowel syndrome)   . Nontraumatic subarachnoid hemorrhage (HCC)   . SAH (subarachnoid hemorrhage) (HCC)   . Urinary retention     Past Surgical History:  Procedure Laterality Date  . ABDOMINAL SURGERY    . ANEURYSM COILING    . COLONOSCOPY N/A 02/20/2015   Procedure: COLONOSCOPY;  Surgeon: Iva Boop, MD;  Location[facility] ENDOSCOPY;  Service: Endoscopy;  Laterality: N/A;  . ESOPHAGOGASTRODUODENOSCOPY (EGD) WITH PROPOFOL N/A 02/02/2015   Procedure: ESOPHAGOGASTRODUODENOSCOPY (EGD) WITH PROPOFOL;  Surgeon: Jimmye Norman, MD;  Location: [facility] ENDOSCOPY;  Service: General;  Laterality: N/A;  . IR GASTRIC TUBE PERC CHG W/O IMG GUIDE  06/08/2016  . IR GENERIC HISTORICAL  08/26/2015   IR GASTRIC TUBE PERC CHG W/O IMG GUIDE 08/26/2015 Irish Lack, MD [facility]  . IR GENERIC HISTORICAL  09/14/2015   IR REPLC GASTRO/COLONIC  TUBE PERCUT W/FLUORO 09/14/2015 Darrell K Allred, PA-C [facility]  . PEG PLACEMENT N/A 02/02/2015   Procedure: PERCUTANEOUS ENDOSCOPIC GASTROSTOMY (PEG) PLACEMENT;  Surgeon: Jimmye Norman, MD;  Location: [facility] ENDOSCOPY;  Service: General;  Laterality: N/A;  . RADIOLOGY WITH ANESTHESIA N/A 01/13/2015   Procedure: RADIOLOGY WITH ANESTHESIA;  Surgeon: Lisbeth Renshaw, MD;  Location: [facility] OR;  Service: Radiology;  Laterality: N/A;  . TRACHEOSTOMY      Social History   Social History  . Marital status: Single    Spouse name: N/A  . Number of children: N/A  . Years of education: N/A   Occupational History  . Not on file.   Social History Main Topics  . Smoking status: Former Games developer  . Smokeless tobacco: Never Used  . Alcohol use No  . Drug use: No  . Sexual activity: Not on file   Other Topics Concern  . Not on file   Social History Narrative  . No narrative on file   History reviewed. No pertinent family history.    VITAL SIGNS BP 123/68   Pulse 66   Temp 98 F (36.7 C)   Resp 18   Ht 5' (1.524 m)   Wt 113 lb 3.2 oz (51.3 kg)   LMP  (LMP Unknown)   SpO2 99%   BMI 22.11 kg/m   Patient's Medications  New Prescriptions   No medications on file  Previous Medications  ACETAMINOPHEN (TYLENOL) 325 MG TABLET    Place 650 mg into feeding tube every 4 (four) hours as needed for mild pain or fever.    AMINO ACIDS-PROTEIN HYDROLYS (FEEDING SUPPLEMENT, PRO-STAT SUGAR FREE 64,) LIQD    Place 60 mLs into feeding tube 3 (three) times daily with meals.    AMLODIPINE (NORVASC) 5 MG TABLET    Place 5 mg into feeding tube daily.    BETHANECHOL (URECHOLINE) 10 MG TABLET    10 mg by PEG Tube route every 8 (eight) hours.    CLONIDINE (CATAPRES) 0.1 MG TABLET    Place 0.1 mg into feeding tube 2 (two) times daily.    ENOXAPARIN (LOVENOX) 60 MG/0.6ML INJECTION    Inject 50 mg into the skin 2 (two) times daily.    HYDROCORTISONE CREAM 1 %    Apply 1 application topically 3 (three) times  daily. Apply to abdomen for rash   LEVETIRACETAM (KEPPRA) 100 MG/ML SOLUTION    Place 5 mLs (500 mg total) into feeding tube 2 (two) times daily.   METOPROLOL (LOPRESSOR) 100 MG TABLET    Place 100 mg into feeding tube 2 (two) times daily.   METOPROLOL TARTRATE (LOPRESSOR) 25 MG TABLET    Place 25 mg into feeding tube 2 (two) times daily.   MULTIPLE VITAMINS-MINERALS (DECUBI-VITE) CAPS    Place 1 capsule into feeding tube daily.    NUTRITIONAL SUPPLEMENTS (FEEDING SUPPLEMENT, JEVITY 1.2 CAL,) LIQD    Give 13ml/hr x 16 hours from 4:00 PM to 8:00 AM   OXYCODONE (OXY IR/ROXICODONE) 5 MG IMMEDIATE RELEASE TABLET    Give 1 tablet via PEG-Tube every 4-8 hours as needed for pain   POLYETHYLENE GLYCOL (MIRALAX / GLYCOLAX) PACKET    Place 17 g into feeding tube daily.   SKIN PROTECTANTS, MISC. (CALAZIME SKIN PROTECTANT EX)    Apply 1 application topically 2 (two) times daily. Apply to buttocks and left groin for irritation   UNABLE TO FIND    HSG Puree diet - HSG Puree texture, Regular consistency, may have pleasure finger foods with supervision   UNABLE TO FIND    Enternal Feet Order - Give 200 ml if water via G-Tube every 4 hours   WATER FOR INJECTION STERILE IJ    Place 30-60 mLs into feeding tube. Flush with 30-60 ml water before and after meds, before initiating feedings or when there is an interruption of feeding to maintain patency.  Also flush every shift with 5-10 ml water between each medication  Modified Medications   No medications on file  Discontinued Medications   No medications on file     SIGNIFICANT DIAGNOSTIC EXAMS    PREVIOUS  02-17-15: ct angio of chest: 1. Extensive pulmonary emboli involving distal main pulmonary arteries extending into lobar, segmental and subsegmental sized branches throughout the lungs bilaterally. At this time, there are no overt findings to suggest right heart strain. 2. Cardiomegaly with left ventricular concentric hypertrophy. 3. Dependent atelectasis  throughout the lower lobes of the lungs bilaterally.  02-18-15: bilateral lower extremity doppler: Findings consistent with acute deep vein thrombosis involving the left common femoral vein, left proximal profunda femoris vein, and left proximal femoral vein. Incidental findings are consistent with: enlarged lymph node on the right. - No evidence of deep vein thrombosis involving the right lower extremity. - No evidence of Baker&'s cyst on the right or left.  02-19-15: pelvic ultrasound: 1. Uterus not identified, presumed hysterectomy. No mass or free fluid seen within the  midline pelvis. 2. Neither ovary is seen, perhaps bilateral oophorectomies, perhaps obscured by the fairly prominent fluid-filled bowel loops in the pelvis. No mass or free fluid seen within either adnexal region.  03-15-15: chest x-ray: 1. Tracheostomy tube is newly absent. No pneumomediastinum or pneumothorax. 2. Stable mild enlargement of the cardiopericardial silhouette. 3. Linear subsegmental atelectasis in the left lower lobe.  03-16-15: chest x-ray: Lungs clear.  Heart prominent but stable  06-14-16: diagnostic right mammogram: benign cyst  08-02-16: left upper extremity doppler: negative for dvt  08-02-16: left hand x-ray: mild osteoarthritis   08-15-16: left hand x-ray: mild osteoarthritis no change   NO NEW EXAMS   LABS REVIEWED: PREVIOUS   08-06-15: urine culture; e-coli: ESBL: imipenem   hgb a1c 5.5; urine micro-albumin 144.1 08-13-15: wbc 6.7 hgb 10.6; hct 35.9; mcv 89.8; plt 330; glucose 121; bun 16.7; creat 0.67; k+ 4.0; na++ 143; liver normal albumin 3.5   11-02-15: wbc 8.2; hgb 11.8; hct 37.3; mcv 89.3; plt 220; glucose 109; bun 16.6; creat 0.88; k+ 5.2; na++ 136 11-23-15: wbc 6.3; hgb 13.8; hct 43.9; mcv 96.8; plt 225; glucose 92; bun 27.6; creat 0.86; k+ 4.3; na++ 148  04-26-16: wbc 6.9; hgb 12.;6 hct 38.0; mcv 94.4; plt 231; glucose 94; bun 10.0; creat 0.65; k+ 4.2; na++ 142; liver normal albumin 4.2; chol 177;  lld 124; trig 101; hdl 33  06-14-16: wbc 8.4; hgb 11.9; hct 36.6; mcv 95.4; plt 203; glucose 134; bun 30.7; creat 0.62; k+ 4.5 ;na++ 149; liver normal albumin 4.2; vit B 12: 521; folate 19.1; chol 155; ldl 85; trig 181; hdl 34; hgb a1c 5.7   NO NEW LABS   Review of Systems  Unable to perform ROS: Other (expressive aphasia )     Physical Exam  Constitutional: No distress.  Frail   Eyes: Conjunctivae are normal.  Neck: Neck supple. No JVD present. No thyromegaly present.  Cardiovascular: Normal rate, regular rhythm and intact distal pulses.   Respiratory: Effort normal and breath sounds normal. No respiratory distress. She has no wheezes.  GI: Soft. Bowel sounds are normal. She exhibits no distension. There is no tenderness.  Peg tube placement   Musculoskeletal: She exhibits no edema.  Able to move left  extremities  Has right hemiplegia    Lymphadenopathy:    She has no cervical adenopathy.  Neurological: She is alert.  Skin: Skin is warm and dry. She is not diaphoretic.  Psychiatric: She has a normal mood and affect.     ASSESSMENT/ PLAN:  1. cva 2. Protein calorie malnutrition 3. Weight loss 4. GERD    1. Will have her see dentist of her oral pain 2. Have speech therapy see her for possible upgrade in diet 3. Will give her zofran 4 mg and tylenol 650 mg prior to tube feeding 4. Will begin protonix 40 mg daily  5. Will have dietary see her for possible formula change.    Time spent with care plan 40 minutes    Synthia Innocenteborah Rutger Salton NP [facility]  Contact [phone] Monday through Friday 8am- 5pm  After hours call [phone]

## 2016-08-27 ENCOUNTER — Encounter: Payer: Self-pay | Admitting: Internal Medicine

## 2016-08-27 ENCOUNTER — Non-Acute Institutional Stay (SKILLED_NURSING_FACILITY): Payer: Medicaid Other | Admitting: Internal Medicine

## 2016-08-27 DIAGNOSIS — R6884 Jaw pain: Secondary | ICD-10-CM

## 2016-08-27 DIAGNOSIS — M26629 Arthralgia of temporomandibular joint, unspecified side: Secondary | ICD-10-CM | POA: Diagnosis not present

## 2016-08-27 DIAGNOSIS — F458 Other somatoform disorders: Secondary | ICD-10-CM

## 2016-08-27 DIAGNOSIS — R1310 Dysphagia, unspecified: Secondary | ICD-10-CM

## 2016-08-27 NOTE — Progress Notes (Signed)
Patient ID: Rachel Vang, female   DOB: 01/26/1966, 51 y.o.   MRN: [id number]    DATE:  08/27/2016  Location:    [facility] Room Number: 204 A Place of Service: SNF (31)   Extended Emergency Contact Information Primary Emergency Contact: Hornbrook,Carlton  [place] Mobile Phone: [phone] Relation: Son Secondary Emergency Contact: Debroah BallerHagler,[name]  [place] Mobile Phone: [phone] Relation: Son  GafferAdvanced Directive information Does Patient Have a Programmer, multimediaMedical Advance Directive?: Yes, Type of Advance Directive: Out of facility DNR (pink MOST or yellow form) (Full Code), Pre-existing out of facility DNR order (yellow form or pink MOST form): Pink MOST form placed in chart (order not valid for inpatient use), Does patient want to make changes to medical advance directive?: No - Patient declined  Chief Complaint  Patient presents with  . Acute Visit    Mouth pain    HPI:  51 yo female long term resident seen today for jaw pain. Pt c/o b/l jaw pain that interferes with meals. She has poor dentition and is awaiting appt with dentist. ST is following pt and noted decreased aperture, gum redness and jaw pain. Pt constantly grinds teeth. No f/c. No d/c. She is a poor historian due to expressive aphasia. Hx obtained from chart  DM - diet controlled. 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  Hypertension - stable on lopressor 125 mg twice daily; clonidine 0.1 mg twice daily; norvasc 5 mg daily   Dysphagia - greatly reduced po intake and is  dependent upon peg tube feeding.  Hx SAH - she has right hemiparesis. stable  Hx recurrent UTI/urine  retention - takes urecholine 10 mg every 8 hours; she no longer requires I/O cath.   Weight loss - related to dysphagia; cont nutritional supplements as ordered  Past Medical History:  Diagnosis Date  . Acute pulmonary embolism (HCC) 02/19/2015  . Acute respiratory failure (HCC)   . Diabetes mellitus without complication (HCC)    Type 2, W/o complications  . DVT (deep venous thrombosis) (HCC) 04/05/2015  . Dysphagia   . Epilepsy (HCC)   . GERD (gastroesophageal reflux disease)   . Hyperlipidemia   . Hypertension   . IBS (irritable bowel syndrome)   . Nontraumatic subarachnoid hemorrhage (HCC)   . SAH (subarachnoid hemorrhage) (HCC)   . Urinary retention     Past Surgical History:  Procedure Laterality Date  . ABDOMINAL SURGERY    . ANEURYSM COILING    . COLONOSCOPY N/A 02/20/2015   Procedure: COLONOSCOPY;  Surgeon: Iva Booparl E Gessner, MD;  [facility];  Service: Endoscopy;  Laterality: N/A;  . ESOPHAGOGASTRODUODENOSCOPY (EGD) WITH PROPOFOL N/A 02/02/2015   Procedure: ESOPHAGOGASTRODUODENOSCOPY (EGD) WITH PROPOFOL;  Surgeon: Jimmye NormanJames Wyatt, MD;  [facility];  Service: General;  Laterality: N/A;  . IR GASTRIC TUBE PERC CHG W/O IMG GUIDE  06/08/2016  . IR GENERIC HISTORICAL  08/26/2015   IR GASTRIC TUBE PERC CHG W/O IMG GUIDE 08/26/2015 Irish LackGlenn Yamagata, MD [facility]INTERV RAD  . IR GENERIC HISTORICAL  09/14/2015   IR REPLC GASTRO/COLONIC TUBE PERCUT W/FLUORO 09/14/2015 Darrell K Allred, PA-C [facility]INTERV  RAD  . PEG PLACEMENT N/A 02/02/2015   Procedure: PERCUTANEOUS ENDOSCOPIC GASTROSTOMY (PEG) PLACEMENT;  Surgeon: Jimmye NormanJames Wyatt, MD;  [facility];  Service: General;  Laterality: N/A;  . RADIOLOGY WITH ANESTHESIA N/A 01/13/2015   Procedure: RADIOLOGY WITH ANESTHESIA;  Surgeon: Lisbeth RenshawNeelesh Nundkumar, MD;  Location: [facility] OR;  Service: Radiology;  Laterality: N/A;  . TRACHEOSTOMY      Patient Care Team: Kirt Boysarter, Consuelo Suthers, DO as PCP - General (Internal Medicine) Chilton SiGreen, Chong Sicilianeborah S, NP as  Nurse Practitioner (Geriatric Medicine) Center, [facility])  Social History   Social History  . Marital status: Single    Spouse name: N/A  . Number of children: N/A  . Years of education: N/A   Occupational History  . Not on file.   Social History Main Topics  . Smoking status: Former Games developermoker  . Smokeless tobacco: Never Used  . Alcohol use No  . Drug use: No  . Sexual activity: Not on file   Other Topics Concern  . Not on file   Social History Narrative  . No narrative on file     reports that she has quit smoking. She has never used smokeless tobacco. She reports that she does not drink alcohol or use drugs.  History reviewed. No pertinent family history. No family status information on file.    Immunization History  Administered Date(s) Administered  . Influenza-Unspecified 03/09/2015, 11/09/2015  . PPD Test 10/07/2015, 10/14/2015    No Known Allergies  Medications: Patient's Medications  New Prescriptions   No medications on file  Previous Medications   ACETAMINOPHEN (TYLENOL) 325 MG TABLET    Place 650 mg into feeding tube every 4 (four) hours as needed for mild pain or fever.    AMINO ACIDS-PROTEIN HYDROLYS (FEEDING SUPPLEMENT, PRO-STAT SUGAR FREE 64,) LIQD    Place 60 mLs into feeding tube 3 (three) times daily with meals.    AMLODIPINE (NORVASC) 5 MG TABLET    Place 5 mg into feeding tube daily.    BETHANECHOL (URECHOLINE) 10 MG TABLET    10 mg by PEG Tube route every 8 (eight) hours.    CLONIDINE (CATAPRES) 0.1 MG TABLET    Place 0.1 mg into feeding tube 2 (two) times daily.    ENOXAPARIN (LOVENOX) 60 MG/0.6ML INJECTION    Inject 50 mg into the skin 2 (two) times daily.    HYDROCORTISONE CREAM 1 %    Apply 1 application topically 3 (three) times daily. Apply to abdomen for rash   LEVETIRACETAM (KEPPRA) 100 MG/ML SOLUTION    Place 5 mLs (500 mg total) into feeding tube 2 (two) times daily.   METOPROLOL (LOPRESSOR) 100 MG  TABLET    Place 100 mg into feeding tube 2 (two) times daily.   METOPROLOL TARTRATE (LOPRESSOR) 25 MG TABLET    Place 25 mg into feeding tube 2 (two) times daily.   MULTIPLE VITAMINS-MINERALS (DECUBI-VITE) CAPS    Place 1 capsule into feeding tube daily.    NUTRITIONAL SUPPLEMENTS (FEEDING SUPPLEMENT, JEVITY 1.2 CAL,) LIQD    Give 6480ml/hr x 16 hours from 4:00 PM to 8:00 AM   ONDANSETRON (ZOFRAN) 4 MG TABLET    Take 4 mg by mouth daily.   OXYCODONE (OXY IR/ROXICODONE) 5 MG IMMEDIATE RELEASE TABLET    Give 1 tablet via PEG-Tube every 4-8 hours as needed for pain   PANTOPRAZOLE (PROTONIX) 40 MG TABLET    40 mg daily. Per G-tube   POLYETHYLENE GLYCOL (MIRALAX / GLYCOLAX) PACKET  Place 17 g into feeding tube daily.   SKIN PROTECTANTS, MISC. (CALAZIME SKIN PROTECTANT EX)    Apply 1 application topically 2 (two) times daily. Apply to buttocks and left groin for irritation   UNABLE TO FIND    HSG Puree diet - HSG Puree texture, Regular consistency, may have pleasure finger foods with supervision   UNABLE TO FIND    Enternal Feet Order - Give 200 ml if water via G-Tube every 4 hours   WATER FOR INJECTION STERILE IJ    Place 30-60 mLs into feeding tube. Flush with 30-60 ml water before and after meds, before initiating feedings or when there is an interruption of feeding to maintain patency.  Also flush every shift with 5-10 ml water between each medication  Modified Medications   No medications on file  Discontinued Medications   No medications on file    Review of Systems  Unable to perform ROS: Other (expressive aphasia)    Vitals:   08/27/16 1216  BP: 122/64  Pulse: 60  Resp: 18  Temp: 97.6 F (36.4 C)  TempSrc: Oral  SpO2: 98%  Weight: 114 lb 9.6 oz (52 kg)  Height: 5' (1.524 m)   Body mass index is 22.38 kg/m.  Physical Exam  Constitutional: She appears well-developed.  Frail appearing sitting in w/c in NAD  HENT:  Decreased aperture of mouth. Gum redness noted but no obvious  d/c. She is audibly grinding teeth. B/l TMJ TTP with swelling and hypertrophy of masseter and pterygoid muscles.   Musculoskeletal: She exhibits edema and tenderness.  Neurological: She is alert.  Skin: Skin is warm and dry. No rash noted.  Psychiatric: She has a normal mood and affect. Her behavior is normal. Thought content normal.     Labs reviewed: Nursing Home on 06/19/2016  Component Date Value Ref Range Status  . Hemoglobin 06/14/2016 11.9* 12.0 - 16.0 g/dL Final  . HCT 84/13/2440 37  36 - 46 % Final  . Neutrophils Absolute 06/14/2016 5  /L Final  . WBC 06/14/2016 8.4  10^3/mL Final  . Glucose 06/14/2016 134  mg/dL Final  . BUN 11/25/2534 31* 4 - 21 mg/dL Final  . Creatinine 64/40/3474 0.6  0.5 - 1.1 mg/dL Final  . Potassium 25/95/6387 4.5  3.4 - 5.3 mmol/L Final  . Sodium 06/14/2016 149* 137 - 147 mmol/L Final  . Triglycerides 06/14/2016 181* 40 - 160 mg/dL Final  . Cholesterol 56/43/3295 155  0 - 200 mg/dL Final  . HDL 18/84/1660 34* 35 - 70 mg/dL Final  . LDL Cholesterol 06/14/2016 85  mg/dL Final  . Alkaline Phosphatase 06/14/2016 118  25 - 125 U/L Final  . ALT 06/14/2016 11  7 - 35 U/L Final  . AST 06/14/2016 12* 13 - 35 U/L Final  . Bilirubin, Total 06/14/2016 4.2  mg/dL Final  . Vitamin Y-30 16/01/930 521   Final  Abstract on 06/15/2016  Component Date Value Ref Range Status  . HM Mammogram 06/14/2016 0-4 Bi-Rad  0-4 Bi-Rad, Self Reported Normal Final   [facility]    No results found.   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 Jaw pain R68.84   3. Bruxism (teeth grinding) F45.8   4. Dysphagia, unspecified type R13.10      PT eval and tx TMJ pain  Cont current meds as ordered  Once TMJ tx, will need to refer to dentist vs oral surgeon  Cont TF as ordered  Will follow   Aryka Coonradt S. Ancil Linsey  [facility] [address] [place], [place] [address] [phone] Cell  (Monday-Friday 8 AM - 5 PM) [phone] After 5 PM and follow prompts

## 2016-08-30 DIAGNOSIS — K219 Gastro-esophageal reflux disease without esophagitis: Secondary | ICD-10-CM | POA: Insufficient documentation

## 2016-08-30 DIAGNOSIS — I639 Cerebral infarction, unspecified: Secondary | ICD-10-CM | POA: Insufficient documentation

## 2016-09-03 ENCOUNTER — Encounter: Payer: Self-pay | Admitting: Adult Health

## 2016-09-03 ENCOUNTER — Non-Acute Institutional Stay (SKILLED_NURSING_FACILITY): Payer: Medicaid Other | Admitting: Adult Health

## 2016-09-03 DIAGNOSIS — R053 Chronic cough: Secondary | ICD-10-CM

## 2016-09-03 DIAGNOSIS — R05 Cough: Secondary | ICD-10-CM

## 2016-09-03 NOTE — Progress Notes (Signed)
Location:   [facility] Room Number: 204 A Place of Service:  SNF (31)   CODE STATUS: Full Code  No Known Allergies  Chief Complaint  Patient presents with  . Acute Visit    cough and congestion    HPI:  Staff reports that for the past couple of days she has been having a cough. There are no reports of fevers present and no reports of sputum present. The staff did initiate standing orders for robitussin without any relief. She is unable to fully participate in the hpi or ros; but denies any shortness of breath.  She does have a history of aspiration in the past.    Past Medical History:  Diagnosis Date  . Acute pulmonary embolism (HCC) 02/19/2015  . Acute respiratory failure (HCC)   . Diabetes mellitus without complication (HCC)    Type 2, W/o complications  . DVT (deep venous thrombosis) (HCC) 04/05/2015  . Dysphagia   . Epilepsy (HCC)   . GERD (gastroesophageal reflux disease)   . Hyperlipidemia   . Hypertension   . IBS (irritable bowel syndrome)   . Nontraumatic subarachnoid hemorrhage (HCC)   . SAH (subarachnoid hemorrhage) (HCC)   . Urinary retention     Past Surgical History:  Procedure Laterality Date  . ABDOMINAL SURGERY    . ANEURYSM COILING    . COLONOSCOPY N/A 02/20/2015   Procedure: COLONOSCOPY;  Surgeon: Iva Boop, MD;  Location: [facility] ENDOSCOPY;  Service: Endoscopy;  Laterality: N/A;  . ESOPHAGOGASTRODUODENOSCOPY (EGD) WITH PROPOFOL N/A 02/02/2015   Procedure: ESOPHAGOGASTRODUODENOSCOPY (EGD) WITH PROPOFOL;  Surgeon: Jimmye Norman, MD;  Location: [facility] ENDOSCOPY;  Service: General;  Laterality: N/A;  . IR GASTRIC TUBE PERC CHG W/O IMG GUIDE  06/08/2016  . IR GENERIC HISTORICAL  08/26/2015   IR GASTRIC TUBE PERC CHG W/O IMG GUIDE 08/26/2015 Irish Lack, MD [facility]  . IR GENERIC HISTORICAL  09/14/2015   IR REPLC GASTRO/COLONIC TUBE PERCUT W/FLUORO 09/14/2015 Darrell K Allred, PA-C [facility]  . PEG PLACEMENT N/A 02/02/2015   Procedure:  PERCUTANEOUS ENDOSCOPIC GASTROSTOMY (PEG) PLACEMENT;  Surgeon: Jimmye Norman, MD;  Location: [facility] ENDOSCOPY;  Service: General;  Laterality: N/A;  . RADIOLOGY WITH ANESTHESIA N/A 01/13/2015   Procedure: RADIOLOGY WITH ANESTHESIA;  Surgeon: Lisbeth Renshaw, MD;  Location: [facility] OR;  Service: Radiology;  Laterality: N/A;  . TRACHEOSTOMY      Social History   Social History  . Marital status: Single    Spouse name: N/A  . Number of children: N/A  . Years of education: N/A   Occupational History  . Not on file.   Social History Main Topics  . Smoking status: Former Games developer  . Smokeless tobacco: Never Used  . Alcohol use No  . Drug use: No  . Sexual activity: Not on file   Other Topics Concern  . Not on file   Social History Narrative  . No narrative on file   History reviewed. No pertinent family history.    VITAL SIGNS BP 117/65   Pulse 65   Temp 97.7 F (36.5 C)   Resp 18   Ht 5' (1.524 m)   Wt 114 lb 9.6 oz (52 kg)   LMP  (LMP Unknown)   SpO2 95%   BMI 22.38 kg/m   Patient's Medications  New Prescriptions   No medications on file  Previous Medications   ACETAMINOPHEN (TYLENOL) 325 MG TABLET    Take 650 mg by mouth daily. And every 4 hours  as needed for mild pain,fever   AMINO ACIDS-PROTEIN HYDROLYS (FEEDING SUPPLEMENT, PRO-STAT SUGAR FREE 64,) LIQD    Place 60 mLs into feeding tube 3 (three) times daily with meals.    AMLODIPINE (NORVASC) 5 MG TABLET    Place 5 mg into feeding tube daily.    BETHANECHOL (URECHOLINE) 10 MG TABLET    10 mg by PEG Tube route every 8 (eight) hours.    CLONIDINE (CATAPRES) 0.1 MG TABLET    Place 0.1 mg into feeding tube 2 (two) times daily.    ENOXAPARIN (LOVENOX) 60 MG/0.6ML INJECTION    Inject 50 mg into the skin 2 (two) times daily.    HYDROCORTISONE CREAM 1 %    Apply 1 application topically 3 (three) times daily. Apply to abdomen for rash   LEVETIRACETAM (KEPPRA) 100 MG/ML SOLUTION    Place 5 mLs (500 mg total) into feeding tube 2  (two) times daily.   METOPROLOL (LOPRESSOR) 100 MG TABLET    Place 100 mg into feeding tube 2 (two) times daily.   METOPROLOL TARTRATE (LOPRESSOR) 25 MG TABLET    Place 25 mg into feeding tube 2 (two) times daily.   MULTIPLE VITAMINS-MINERALS (DECUBI-VITE) CAPS    Place 1 capsule into feeding tube daily.    NUTRITIONAL SUPPLEMENTS (FEEDING SUPPLEMENT, JEVITY 1.2 CAL,) LIQD    Give 7680ml/hr x 16 hours from 4:00 PM to 8:00 AM   ONDANSETRON (ZOFRAN) 4 MG TABLET    Take 4 mg by mouth daily.   OXYCODONE (OXY IR/ROXICODONE) 5 MG IMMEDIATE RELEASE TABLET    Give 1 tablet via PEG-Tube every 4-8 hours as needed for pain   PANTOPRAZOLE (PROTONIX) 40 MG TABLET    40 mg daily. Per G-tube   POLYETHYLENE GLYCOL (MIRALAX / GLYCOLAX) PACKET    Place 17 g into feeding tube daily.   UNABLE TO FIND    HSG Puree diet - HSG Puree texture, Regular consistency, may have pleasure finger foods with supervision   UNABLE TO FIND    Enternal Feet Order - Give 200 ml if water via G-Tube every 4 hours   WATER FOR INJECTION STERILE IJ    Place 30-60 mLs into feeding tube. Flush with 30-60 ml water before and after meds, before initiating feedings or when there is an interruption of feeding to maintain patency.  Also flush every shift with 5-10 ml water between each medication  Modified Medications   No medications on file  Discontinued Medications   SKIN PROTECTANTS, MISC. (CALAZIME SKIN PROTECTANT EX)    Apply 1 application topically 2 (two) times daily. Apply to buttocks and left groin for irritation     SIGNIFICANT DIAGNOSTIC EXAMS  PREVIOUS  02-17-15: ct angio of chest: 1. Extensive pulmonary emboli involving distal main pulmonary arteries extending into lobar, segmental and subsegmental sized branches throughout the lungs bilaterally. At this time, there are no overt findings to suggest right heart strain. 2. Cardiomegaly with left ventricular concentric hypertrophy. 3. Dependent atelectasis throughout the lower lobes  of the lungs bilaterally.  02-18-15: bilateral lower extremity doppler: Findings consistent with acute deep vein thrombosis involving the left common femoral vein, left proximal profunda femoris vein, and left proximal femoral vein. Incidental findings are consistent with: enlarged lymph node on the right. - No evidence of deep vein thrombosis involving the right lower extremity. - No evidence of Baker&'s cyst on the right or left.  02-19-15: pelvic ultrasound: 1. Uterus not identified, presumed hysterectomy. No mass or free fluid seen  within the midline pelvis. 2. Neither ovary is seen, perhaps bilateral oophorectomies, perhaps obscured by the fairly prominent fluid-filled bowel loops in the pelvis. No mass or free fluid seen within either adnexal region.  06-14-16: diagnostic right mammogram: benign cyst  08-02-16: left upper extremity doppler: negative for dvt  08-02-16: left hand x-ray: mild osteoarthritis   08-15-16: left hand x-ray: mild osteoarthritis no change   NO NEW EXAMS   LABS REVIEWED: PREVIOUS    11-02-15: wbc 8.2; hgb 11.8; hct 37.3; mcv 89.3; plt 220; glucose 109; bun 16.6; creat 0.88; k+ 5.2; na++ 136 11-23-15: wbc 6.3; hgb 13.8; hct 43.9; mcv 96.8; plt 225; glucose 92; bun 27.6; creat 0.86; k+ 4.3; na++ 148  04-26-16: wbc 6.9; hgb 12.;6 hct 38.0; mcv 94.4; plt 231; glucose 94; bun 10.0; creat 0.65; k+ 4.2; na++ 142; liver normal albumin 4.2; chol 177; lld 124; trig 101; hdl 33  06-14-16: wbc 8.4; hgb 11.9; hct 36.6; mcv 95.4; plt 203; glucose 134; bun 30.7; creat 0.62; k+ 4.5 ;na++ 149; liver normal albumin 4.2; vit B 12: 521; folate 19.1; chol 155; ldl 85; trig 181; hdl 34; hgb a1c 5.7   NO NEW LABS   Review of Systems  Unable to perform ROS: Other (has aphasia )   Physical Exam  Constitutional: No distress.  Eyes: Conjunctivae are normal.  Neck: Neck supple. No JVD present. No thyromegaly present.  Cardiovascular: Normal rate, regular rhythm and intact distal pulses.     Respiratory: Effort normal. No respiratory distress. She has no wheezes.  Breath sounds diminished in bases   GI: Soft. Bowel sounds are normal. She exhibits no distension. There is no tenderness.  Has peg tube placed   Musculoskeletal: She exhibits no edema.  Right hemiparesis Is able to move left extremities    Lymphadenopathy:    She has no cervical adenopathy.  Neurological: She is alert.  Skin: Skin is warm and dry. She is not diaphoretic.  Psychiatric: She has a normal mood and affect.    ASSESSMENT/ PLAN:  1. Cough: I question possible pneumonia: will get a chest x-ray and will treat as indicated; will continue robitussin for her cough and will continue to monitor her status.     Synthia Innocent NP [facility]  Contact [phone] Monday through Friday 8am- 5pm  After hours call [phone]

## 2016-09-14 ENCOUNTER — Encounter: Payer: Self-pay | Admitting: Adult Health

## 2016-09-14 ENCOUNTER — Non-Acute Institutional Stay (SKILLED_NURSING_FACILITY): Payer: Medicaid Other | Admitting: Adult Health

## 2016-09-14 DIAGNOSIS — I69351 Hemiplegia and hemiparesis following cerebral infarction affecting right dominant side: Secondary | ICD-10-CM

## 2016-09-14 DIAGNOSIS — R634 Abnormal weight loss: Secondary | ICD-10-CM | POA: Diagnosis not present

## 2016-09-14 DIAGNOSIS — R339 Retention of urine, unspecified: Secondary | ICD-10-CM

## 2016-09-14 DIAGNOSIS — I609 Nontraumatic subarachnoid hemorrhage, unspecified: Secondary | ICD-10-CM | POA: Diagnosis not present

## 2016-09-14 NOTE — Progress Notes (Signed)
Location:   [facility] Room Number: 204 A Place of Service:  SNF (31)   CODE STATUS: Full Code  No Known Allergies  Chief Complaint  Patient presents with  . Medical Management of Chronic Issues    *sah; urine retention; weight loss     HPI:  She is a 51 year old long term resident of this facility being seen for the management of her chronic illnesses: sah; urine retention; weight loss .  She is unable to participate in the hpi or ros. There are no reports of her declining feedings; behavioral issues; no indications of pain present. There are no nursing concerns at this time.    Past Medical History:  Diagnosis Date  . Acute pulmonary embolism (HCC) 02/19/2015  . Acute respiratory failure (HCC)   . Diabetes mellitus without complication (HCC)    Type 2, W/o complications  . DVT (deep venous thrombosis) (HCC) 04/05/2015  . Dysphagia   . Epilepsy (HCC)   . GERD (gastroesophageal reflux disease)   . Hyperlipidemia   . Hypertension   . IBS (irritable bowel syndrome)   . Nontraumatic subarachnoid hemorrhage (HCC)   . SAH (subarachnoid hemorrhage) (HCC)   . Urinary retention     Past Surgical History:  Procedure Laterality Date  . ABDOMINAL SURGERY    . ANEURYSM COILING    . COLONOSCOPY N/A 02/20/2015   Procedure: COLONOSCOPY;  Surgeon: Iva Boop, MD;  Location: [facility] ENDOSCOPY;  Service: Endoscopy;  Laterality: N/A;  . ESOPHAGOGASTRODUODENOSCOPY (EGD) WITH PROPOFOL N/A 02/02/2015   Procedure: ESOPHAGOGASTRODUODENOSCOPY (EGD) WITH PROPOFOL;  Surgeon: Jimmye Norman, MD;  Location: [facility] ENDOSCOPY;  Service: General;  Laterality: N/A;  . IR GASTRIC TUBE PERC CHG W/O IMG GUIDE  06/08/2016  . IR GENERIC HISTORICAL  08/26/2015   IR GASTRIC TUBE PERC CHG W/O IMG GUIDE 08/26/2015 Irish Lack, MD [facility]INTER[facility]  . IR GENERIC HISTORICAL  09/14/2015   IR REPLC GASTRO/COLONIC TUBE PERCUT W/FLUORO 09/14/2015 Darrell K Allred, PA-C [facility]  . PEG PLACEMENT N/A 02/02/2015   Procedure: PERCUTANEOUS ENDOSCOPIC GASTROSTOMY (PEG) PLACEMENT;  Surgeon: Jimmye Norman, MD;  Location: [facility] ENDOSCOPY;  Service: General;  Laterality: N/A;  . RADIOLOGY WITH ANESTHESIA N/A 01/13/2015   Procedure: RADIOLOGY WITH ANESTHESIA;  Surgeon: Lisbeth Renshaw, MD;  Location: [facility] OR;  Service: Radiology;  Laterality: N/A;  . TRACHEOSTOMY      Social History   Social History  . Marital status: Single    Spouse name: N/A  . Number of children: N/A  . Years of education: N/A   Occupational History  . Not on file.   Social History Main Topics  . Smoking status: Former Games developer  . Smokeless tobacco: Never Used  . Alcohol use No  . Drug use: No  . Sexual activity: Not on file   Other Topics Concern  . Not on file   Social History Narrative  . No narrative on file   History reviewed. No pertinent family history.    VITAL SIGNS BP 128/76   Pulse 74   Temp (!) 97.1 F (36.2 C)   Resp 17   Ht 5' (1.524 m)   Wt 123 lb 11.2 oz (56.1 kg)   LMP  (LMP Unknown)   SpO2 98%   BMI 24.16 kg/m   Patient's Medications  New Prescriptions   No medications on file  Previous Medications   ACETAMINOPHEN (TYLENOL) 325 MG TABLET    Take 650 mg by mouth daily. And every 4 hours  as needed for mild pain,fever   AMINO ACIDS-PROTEIN HYDROLYS (FEEDING SUPPLEMENT, PRO-STAT SUGAR FREE 64,) LIQD    Place 60 mLs into feeding tube 3 (three) times daily with meals.    AMLODIPINE (NORVASC) 5 MG TABLET    Place 5 mg into feeding tube daily.    BETHANECHOL (URECHOLINE) 10 MG TABLET    10 mg by PEG Tube route every 8 (eight) hours.    CLONIDINE (CATAPRES) 0.1 MG TABLET    Place 0.1 mg into feeding tube 2 (two) times daily.    ENOXAPARIN (LOVENOX) 60 MG/0.6ML INJECTION    Inject 50 mg into the skin 2 (two) times daily.    HYDROCORTISONE CREAM 1 %    Apply 1 application topically 3 (three) times daily. Apply to abdomen for rash   LEVETIRACETAM (KEPPRA) 100 MG/ML SOLUTION    Place 5 mLs (500 mg total)  into feeding tube 2 (two) times daily.   METOPROLOL (LOPRESSOR) 100 MG TABLET    Place 100 mg into feeding tube 2 (two) times daily.   METOPROLOL TARTRATE (LOPRESSOR) 25 MG TABLET    Place 25 mg into feeding tube 2 (two) times daily.   MULTIPLE VITAMINS-MINERALS (DECUBI-VITE) CAPS    Place 1 capsule into feeding tube daily.    NUTRITIONAL SUPPLEMENTS (FEEDING SUPPLEMENT, JEVITY 1.2 CAL,) LIQD    Give 186ml/hr x 12 hours from 8:00 PM to 8:00 AM   ONDANSETRON (ZOFRAN) 4 MG TABLET    Take 4 mg by mouth daily.   OXYCODONE (OXY IR/ROXICODONE) 5 MG IMMEDIATE RELEASE TABLET    Give 1 tablet via PEG-Tube every 4-8 hours as needed for pain   PANTOPRAZOLE (PROTONIX) 40 MG TABLET    40 mg daily. Per G-tube   POLYETHYLENE GLYCOL (MIRALAX / GLYCOLAX) PACKET    Place 17 g into feeding tube daily.   UNABLE TO FIND    HSG Puree diet - HSG Puree texture, Regular consistency, may have pleasure finger foods with supervision   UNABLE TO FIND    Enternal Feet Order - Give 200 ml if water via G-Tube every 4 hours   WATER FOR INJECTION STERILE IJ    Place 30-60 mLs into feeding tube. Flush with 30-60 ml water before and after meds, before initiating feedings or when there is an interruption of feeding to maintain patency.  Also flush every shift with 5-10 ml water between each medication  Modified Medications   No medications on file  Discontinued Medications   No medications on file     SIGNIFICANT DIAGNOSTIC EXAMS   PREVIOUS  02-17-15: ct angio of chest: 1. Extensive pulmonary emboli involving distal main pulmonary arteries extending into lobar, segmental and subsegmental sized branches throughout the lungs bilaterally. At this time, there are no overt findings to suggest right heart strain. 2. Cardiomegaly with left ventricular concentric hypertrophy. 3. Dependent atelectasis throughout the lower lobes of the lungs bilaterally.  02-18-15: bilateral lower extremity doppler: Findings consistent with acute deep  vein thrombosis involving the left common femoral vein, left proximal profunda femoris vein, and left proximal femoral vein. Incidental findings are consistent with: enlarged lymph node on the right. - No evidence of deep vein thrombosis involving the right lower extremity. - No evidence of Baker&'s cyst on the right or left.  02-19-15: pelvic ultrasound: 1. Uterus not identified, presumed hysterectomy. No mass or free fluid seen within the midline pelvis. 2. Neither ovary is seen, perhaps bilateral oophorectomies, perhaps obscured by the fairly prominent fluid-filled bowel loops  in the pelvis. No mass or free fluid seen within either adnexal region.  06-14-16: diagnostic right mammogram: benign cyst  08-02-16: left upper extremity doppler: negative for dvt  08-02-16: left hand x-ray: mild osteoarthritis   08-15-16: left hand x-ray: mild osteoarthritis no change   TODAY:  09-04-16: chest x-ray:  cardio retrocardiac opacities    LABS REVIEWED: PREVIOUS    11-02-15: wbc 8.2; hgb 11.8; hct 37.3; mcv 89.3; plt 220; glucose 109; bun 16.6; creat 0.88; k+ 5.2; na++ 136 11-23-15: wbc 6.3; hgb 13.8; hct 43.9; mcv 96.8; plt 225; glucose 92; bun 27.6; creat 0.86; k+ 4.3; na++ 148  04-26-16: wbc 6.9; hgb 12.;6 hct 38.0; mcv 94.4; plt 231; glucose 94; bun 10.0; creat 0.65; k+ 4.2; na++ 142; liver normal albumin 4.2; chol 177; lld 124; trig 101; hdl 33  06-14-16: wbc 8.4; hgb 11.9; hct 36.6; mcv 95.4; plt 203; glucose 134; bun 30.7; creat 0.62; k+ 4.5 ;na++ 149; liver normal albumin 4.2; vit B 12: 521; folate 19.1; chol 155; ldl 85; trig 181; hdl 34; hgb a1c 5.7   NO NEW LABS   Review of Systems  Unable to perform ROS: Other (has aphasia )   Physical Exam  Constitutional: No distress.  Eyes: Conjunctivae are normal.  Neck: Neck supple. No JVD present. No thyromegaly present.  Cardiovascular: Normal rate, regular rhythm and intact distal pulses.   Respiratory: Effort normal. No respiratory distress. She has  no wheezes.  Breath sounds diminished in bases   GI: Soft. Bowel sounds are normal. She exhibits no distension. There is no tenderness.  Has peg tube placed no signs of infection present  Musculoskeletal: She exhibits no edema.  Right hemiparesis Is able to move left extremities   Lymphadenopathy:    She has no cervical adenopathy.  Neurological: She is alert.  Skin: Skin is warm and dry. She is not diaphoretic.  Psychiatric: She has a normal mood and affect.   ASSESSMENT/ PLAN:  TODAY  1. SAH: is neurologically without change; has right hemiparesis; will monitor her status.   2. UTI/urine retention: stable is currently off all medications; will not make changes will not make changes.   3.  Weight loss:  Her weight is 123 pounds. Has improved; is now allowing for peg tube feedings.  will continue her current plan of care and will continue to monitor her status.    PREVIOUS  4. Diabetes: is stable  hgb a1c is 5.7 (previous 5.5) ; is currently off medications; will continue to monitor her status.  ldl is 85   5. Seizure: is stable  no reports of seizure activity present: will continue keppra 500 mg twice daily and will monitor   6. DVT/PE: is stable  she does require long term anticoagulation therapy; will continue lovenox 50  mg twice daily (weigt based); is not a candidate for xarelto; or eliquis due to her history of GI bleed; she is not appropriate for coumadin therapy; unable  to obtain adequate INR.  7. Hypertension: stable b/p128/76   will continue lopressor 125 mg twice daily and clonidine 0.1 mg twice daily norvasc 5 mg daily   8.  Dysphagia: stable  no signs of aspiration present  her po intake is not adequate to maintain her body weight; she is dependent upon peg tube feeding.  Will check hgb a1c; urine micro-albumin fobt   MD is aware of resident's narcotic use and is in agreement with current plan of care. We will attempt to wean  resident as apropriate   Synthia Innocent NP [facility]  Contact [phone] Monday through Friday 8am- 5pm  After hours call [phone]

## 2016-09-15 LAB — HEMOGLOBIN A1C: Hemoglobin A1C: 5.4

## 2016-09-17 DIAGNOSIS — R053 Chronic cough: Secondary | ICD-10-CM | POA: Insufficient documentation

## 2016-09-17 DIAGNOSIS — R05 Cough: Secondary | ICD-10-CM | POA: Insufficient documentation

## 2016-09-23 ENCOUNTER — Encounter: Payer: Self-pay | Admitting: Adult Health

## 2016-09-23 NOTE — Progress Notes (Signed)
Location:   [facility] Room Number: 204 A Place of Service:  SNF (31)   CODE STATUS: Full Code  No Known Allergies  Chief Complaint  Patient presents with  . Medical Management of Chronic Issues           HPI:    Past Medical History:  Diagnosis Date  . Acute pulmonary embolism (HCC) 02/19/2015  . Acute respiratory failure (HCC)   . Diabetes mellitus without complication (HCC)    Type 2, W/o complications  . DVT (deep venous thrombosis) (HCC) 04/05/2015  . Dysphagia   . Epilepsy (HCC)   . GERD (gastroesophageal reflux disease)   . Hyperlipidemia   . Hypertension   . IBS (irritable bowel syndrome)   . Nontraumatic subarachnoid hemorrhage (HCC)   . SAH (subarachnoid hemorrhage) (HCC)   . Urinary retention     Past Surgical History:  Procedure Laterality Date  . ABDOMINAL SURGERY    . ANEURYSM COILING    . COLONOSCOPY N/A 02/20/2015   Procedure: COLONOSCOPY;  Surgeon: Iva Boop, MD;  Location: [facility] ENDOSCOPY;  Service: Endoscopy;  Laterality: N/A;  . ESOPHAGOGASTRODUODENOSCOPY (EGD) WITH PROPOFOL N/A 02/02/2015   Procedure: ESOPHAGOGASTRODUODENOSCOPY (EGD) WITH PROPOFOL;  Surgeon: Jimmye Norman, MD;  Location: [facility] ENDOSCOPY;  Service: General;  Laterality: N/A;  . IR GASTRIC TUBE PERC CHG W/O IMG GUIDE  06/08/2016  . IR GENERIC HISTORICAL  08/26/2015   IR GASTRIC TUBE PERC CHG W/O IMG GUIDE 08/26/2015 Irish Lack, MD [facility]  . IR GENERIC HISTORICAL  09/14/2015   IR REPLC GASTRO/COLONIC TUBE PERCUT W/FLUORO 09/14/2015 Darrell K Allred, PA-C [facility]  . PEG PLACEMENT N/A 02/02/2015   Procedure: PERCUTANEOUS ENDOSCOPIC GASTROSTOMY (PEG) PLACEMENT;  Surgeon: Jimmye Norman, MD;  Location: [facility] ENDOSCOPY;  Service: General;  Laterality: N/A;  . RADIOLOGY WITH ANESTHESIA N/A 01/13/2015   Procedure: RADIOLOGY WITH ANESTHESIA;  Surgeon: Lisbeth Renshaw, MD;  Location: [facility] OR;  Service: Radiology;  Laterality: N/A;  . TRACHEOSTOMY      Social History    Social History  . Marital status: Single    Spouse name: N/A  . Number of children: N/A  . Years of education: N/A   Occupational History  . Not on file.   Social History Main Topics  . Smoking status: Former Games developer  . Smokeless tobacco: Never Used  . Alcohol use No  . Drug use: No  . Sexual activity: Not on file   Other Topics Concern  . Not on file   Social History Narrative  . No narrative on file   History reviewed. No pertinent family history.    VITAL SIGNS BP 130/74   Pulse 80   Temp 98.4 F (36.9 C)   Resp 18   Ht 5' (1.524 m)   Wt 120 lb 3.2 oz (54.5 kg)   LMP  (LMP Unknown)   SpO2 98%   BMI 23.47 kg/m   Patient's Medications  New Prescriptions   No medications on file  Previous Medications   ACETAMINOPHEN (TYLENOL) 325 MG TABLET    Place 650 mg into feeding tube daily. And every 4 hours as needed for mild pain,fever   AMINO ACIDS-PROTEIN HYDROLYS (FEEDING SUPPLEMENT, PRO-STAT SUGAR FREE 64,) LIQD    Place 60 mLs into feeding tube 3 (three) times daily with meals.    AMLODIPINE (NORVASC) 5 MG TABLET    Place 5 mg into feeding tube daily.    BETHANECHOL (URECHOLINE) 10 MG TABLET    10 mg  by PEG Tube route every 8 (eight) hours.    CLONIDINE (CATAPRES) 0.1 MG TABLET    Place 0.1 mg into feeding tube 2 (two) times daily.    ENOXAPARIN (LOVENOX) 60 MG/0.6ML INJECTION    Inject 50 mg into the skin 2 (two) times daily.    HYDROCORTISONE CREAM 1 %    Apply 1 application topically 3 (three) times daily. Apply to abdomen for rash   LEVETIRACETAM (KEPPRA) 100 MG/ML SOLUTION    Place 5 mLs (500 mg total) into feeding tube 2 (two) times daily.   METOPROLOL (LOPRESSOR) 100 MG TABLET    Place 100 mg into feeding tube 2 (two) times daily.   METOPROLOL TARTRATE (LOPRESSOR) 25 MG TABLET    Place 25 mg into feeding tube 2 (two) times daily.   MULTIPLE VITAMINS-MINERALS (DECUBI-VITE) CAPS    Place 1 capsule into feeding tube daily.    NUTRITIONAL SUPPLEMENTS (FEEDING  SUPPLEMENT, JEVITY 1.2 CAL,) LIQD    Give 131ml/hr x 12 hours from 8:00 PM to 8:00 AM   ONDANSETRON (ZOFRAN) 4 MG TABLET    Take 4 mg by mouth daily.   OXYCODONE (OXY IR/ROXICODONE) 5 MG IMMEDIATE RELEASE TABLET    Give 1 tablet via PEG-Tube every 4-8 hours as needed for pain   PANTOPRAZOLE SODIUM POWD    Give 1 packet (40 mg) via G-Tube one time a day for GERD   POLYETHYLENE GLYCOL (MIRALAX / GLYCOLAX) PACKET    Place 17 g into feeding tube daily.   UNABLE TO FIND    HSG Puree diet - HSG Puree texture, Regular consistency, may have pleasure finger foods with supervision   UNABLE TO FIND    Enternal Feet Order - Give 200 ml if water via G-Tube every 4 hours   WATER FOR INJECTION STERILE IJ    Place 30-60 mLs into feeding tube. Flush with 30-60 ml water before and after meds, before initiating feedings or when there is an interruption of feeding to maintain patency.  Also flush every shift with 5-10 ml water between each medication  Modified Medications   No medications on file  Discontinued Medications   PANTOPRAZOLE (PROTONIX) 40 MG TABLET    40 mg daily. Per G-tube     SIGNIFICANT DIAGNOSTIC EXAMS       ASSESSMENT/ PLAN:    MD is aware of resident's narcotic use and is in agreement with current plan of care. We will attempt to wean resident as apropriate   Synthia Innocent NP [facility] Adult Medicine  Contact [phone] Monday through Friday 8am- 5pm  After hours call [phone]

## 2016-09-24 LAB — MICROALBUMIN, URINE

## 2016-10-06 NOTE — Progress Notes (Signed)
This encounter was created in error - please disregard.

## 2016-10-11 LAB — BASIC METABOLIC PANEL
BUN: 26 — AB (ref 4–21)
Creatinine: 0.6 (ref 0.5–1.1)
GLUCOSE: 133
POTASSIUM: 4.8 (ref 3.4–5.3)
SODIUM: 140 (ref 137–147)

## 2016-10-11 LAB — HEPATIC FUNCTION PANEL
ALT: 10 (ref 7–35)
AST: 9 — AB (ref 13–35)
Alkaline Phosphatase: 111 (ref 25–125)
BILIRUBIN, TOTAL: 0.2

## 2016-10-17 ENCOUNTER — Encounter: Payer: Self-pay | Admitting: Adult Health

## 2016-10-17 ENCOUNTER — Non-Acute Institutional Stay (SKILLED_NURSING_FACILITY): Payer: Medicaid Other | Admitting: Adult Health

## 2016-10-17 DIAGNOSIS — I2782 Chronic pulmonary embolism: Secondary | ICD-10-CM | POA: Diagnosis not present

## 2016-10-17 DIAGNOSIS — R569 Unspecified convulsions: Secondary | ICD-10-CM

## 2016-10-17 DIAGNOSIS — I639 Cerebral infarction, unspecified: Secondary | ICD-10-CM | POA: Diagnosis not present

## 2016-10-17 DIAGNOSIS — I609 Nontraumatic subarachnoid hemorrhage, unspecified: Secondary | ICD-10-CM | POA: Diagnosis not present

## 2016-10-17 DIAGNOSIS — R634 Abnormal weight loss: Secondary | ICD-10-CM | POA: Diagnosis not present

## 2016-10-17 NOTE — Progress Notes (Signed)
Location:   [facility] Room Number: 204 A Place of Service:  SNF (31)   CODE STATUS: Full Code  No Known Allergies  Chief Complaint  Patient presents with  . Acute Visit    Care Plan Meeting    HPI:  We are having a care plan meeting for her; this is her quarterly meeting. Her family is not present at this time. She is unable to fully participate in the hpi or ros. She is doing well; she is gaining weight once again. She does allow for her tube feeding. The care plan team doe not have any concerns at this time. There are no reports of behavioral issues; or pain from the nursing staff.   Past Medical History:  Diagnosis Date  . Acute pulmonary embolism (HCC) 02/19/2015  . Acute respiratory failure (HCC)   . Diabetes mellitus without complication (HCC)    Type 2, W/o complications  . DVT (deep venous thrombosis) (HCC) 04/05/2015  . Dysphagia   . Epilepsy (HCC)   . GERD (gastroesophageal reflux disease)   . Hyperlipidemia   . Hypertension   . IBS (irritable bowel syndrome)   . Nontraumatic subarachnoid hemorrhage (HCC)   . SAH (subarachnoid hemorrhage) (HCC)   . Urinary retention     Past Surgical History:  Procedure Laterality Date  . ABDOMINAL SURGERY    . ANEURYSM COILING    . COLONOSCOPY N/A 02/20/2015   Procedure: COLONOSCOPY;  Surgeon: Iva Boop, MD;  Location: [facility] ENDOSCOPY;  Service: Endoscopy;  Laterality: N/A;  . ESOPHAGOGASTRODUODENOSCOPY (EGD) WITH PROPOFOL N/A 02/02/2015   Procedure: ESOPHAGOGASTRODUODENOSCOPY (EGD) WITH PROPOFOL;  Surgeon: Jimmye Norman, MD;  Location: [facility] ENDOSCOPY;  Service: General;  Laterality: N/A;  . IR GASTRIC TUBE PERC CHG W/O IMG GUIDE  06/08/2016  . IR GENERIC HISTORICAL  08/26/2015   IR GASTRIC TUBE PERC CHG W/O IMG GUIDE 08/26/2015 Irish Lack, MD [facility]INTERV RAD  . IR GENERIC HISTORICAL  09/14/2015   IR REPLC GASTRO/COLONIC TUBE PERCUT W/FLUORO 09/14/2015 Darrell K Allred, PA-C [facility]INTERV RAD  . PEG PLACEMENT N/A 02/02/2015     Procedure: PERCUTANEOUS ENDOSCOPIC GASTROSTOMY (PEG) PLACEMENT;  Surgeon: Jimmye Norman, MD;  Location: [facility] ENDOSCOPY;  Service: General;  Laterality: N/A;  . RADIOLOGY WITH ANESTHESIA N/A 01/13/2015   Procedure: RADIOLOGY WITH ANESTHESIA;  Surgeon: Lisbeth Renshaw, MD;  Location: [facility] OR;  Service: Radiology;  Laterality: N/A;  . TRACHEOSTOMY      Social History   Social History  . Marital status: Single    Spouse name: N/A  . Number of children: N/A  . Years of education: N/A   Occupational History  . Not on file.   Social History Main Topics  . Smoking status: Former Games developer  . Smokeless tobacco: Never Used  . Alcohol use No  . Drug use: No  . Sexual activity: Not on file   Other Topics Concern  . Not on file   Social History Narrative  . No narrative on file   History reviewed. No pertinent family history.    VITAL SIGNS BP 110/88   Pulse (!) 103   Temp (!) 97.4 F (36.3 C)   Resp (!) 22   Ht 5' (1.524 m)   Wt 115 lb (52.2 kg)   LMP  (LMP Unknown)   SpO2 95%   BMI 22.46 kg/m   Patient's Medications  New Prescriptions   No medications on file  Previous Medications   ACETAMINOPHEN (TYLENOL) 325 MG TABLET    Place  650 mg into feeding tube daily. And every 4 hours as needed for mild pain,fever   AMINO ACIDS-PROTEIN HYDROLYS (FEEDING SUPPLEMENT, PRO-STAT SUGAR FREE 64,) LIQD    Place 60 mLs into feeding tube 3 (three) times daily with meals.    AMLODIPINE (NORVASC) 5 MG TABLET    Place 5 mg into feeding tube daily.    BETHANECHOL (URECHOLINE) 10 MG TABLET    10 mg by PEG Tube route every 8 (eight) hours.    CLONIDINE (CATAPRES) 0.1 MG TABLET    Place 0.1 mg into feeding tube 2 (two) times daily.    ENOXAPARIN (LOVENOX) 60 MG/0.6ML INJECTION    Inject 60 mg into the skin 2 (two) times daily.    GABAPENTIN (NEURONTIN) 100 MG CAPSULE    Take 100 mg by mouth 2 (two) times daily.   HYDROCORTISONE CREAM 1 %    Apply 1 application topically 3 (three) times daily.  Apply to abdomen for rash   LEVETIRACETAM (KEPPRA) 100 MG/ML SOLUTION    Place 5 mLs (500 mg total) into feeding tube 2 (two) times daily.   METOPROLOL (LOPRESSOR) 100 MG TABLET    Place 100 mg into feeding tube 2 (two) times daily.   METOPROLOL TARTRATE (LOPRESSOR) 25 MG TABLET    Place 25 mg into feeding tube 2 (two) times daily.   MULTIPLE VITAMINS-MINERALS (DECUBI-VITE) CAPS    Place 1 capsule into feeding tube daily.    NUTRITIONAL SUPPLEMENTS (FEEDING SUPPLEMENT, JEVITY 1.2 CAL,) LIQD    100 mls/hour, for 12 hours/day.  On at 8pm, off at 8am.  May substitute Jevity 1.5 for two cal until tow cal becomes available   ONDANSETRON (ZOFRAN) 4 MG TABLET    Take 4 mg by mouth daily.   OXYCODONE (OXY IR/ROXICODONE) 5 MG IMMEDIATE RELEASE TABLET    Give 1 tablet via PEG-Tube every 4-8 hours as needed for pain   PANTOPRAZOLE SODIUM POWD    Give 1 packet (40 mg) via G-Tube one time a day for GERD   POLYETHYLENE GLYCOL (MIRALAX / GLYCOLAX) PACKET    Place 17 g into feeding tube daily.   TIZANIDINE (ZANAFLEX) 4 MG TABLET    Take 4 mg by mouth 3 (three) times daily. Hold for sedation   UNABLE TO FIND    HSG Puree diet - HSG Puree texture, Regular consistency, may have pleasure finger foods with supervision   UNABLE TO FIND    Enternal Feet Order - Give 200 ml if water via G-Tube every 4 hours   WATER FOR INJECTION STERILE IJ    Place 30-60 mLs into feeding tube. Flush with 30-60 ml water before and after meds, before initiating feedings or when there is an interruption of feeding to maintain patency.  Also flush every shift with 5-10 ml water between each medication  Modified Medications   No medications on file  Discontinued Medications   NUTRITIONAL SUPPLEMENTS (FEEDING SUPPLEMENT, JEVITY 1.2 CAL,) LIQD    Give 172ml/hr x 12 hours from 8:00 PM to 8:00 AM     SIGNIFICANT DIAGNOSTIC EXAMS  PREVIOUS  02-17-15: ct angio of chest: 1. Extensive pulmonary emboli involving distal main pulmonary arteries  extending into lobar, segmental and subsegmental sized branches throughout the lungs bilaterally. At this time, there are no overt findings to suggest right heart strain. 2. Cardiomegaly with left ventricular concentric hypertrophy. 3. Dependent atelectasis throughout the lower lobes of the lungs bilaterally.  02-18-15: bilateral lower extremity doppler: Findings consistent with acute deep vein  thrombosis involving the left common femoral vein, left proximal profunda femoris vein, and left proximal femoral vein. Incidental findings are consistent with: enlarged lymph node on the right. - No evidence of deep vein thrombosis involving the right lower extremity. - No evidence of Baker&'s cyst on the right or left.  02-19-15: pelvic ultrasound: 1. Uterus not identified, presumed hysterectomy. No mass or free fluid seen within the midline pelvis. 2. Neither ovary is seen, perhaps bilateral oophorectomies, perhaps obscured by the fairly prominent fluid-filled bowel loops in the pelvis. No mass or free fluid seen within either adnexal region.  06-14-16: diagnostic right mammogram: benign cyst  08-02-16: left upper extremity doppler: negative for dvt  08-02-16: left hand x-ray: mild osteoarthritis   08-15-16: left hand x-ray: mild osteoarthritis no change   09-04-16: chest x-ray:  cardio retrocardiac opacities  NO NEW EXAMS     LABS REVIEWED: PREVIOUS    11-02-15: wbc 8.2; hgb 11.8; hct 37.3; mcv 89.3; plt 220; glucose 109; bun 16.6; creat 0.88; k+ 5.2; na++ 136 11-23-15: wbc 6.3; hgb 13.8; hct 43.9; mcv 96.8; plt 225; glucose 92; bun 27.6; creat 0.86; k+ 4.3; na++ 148  04-26-16: wbc 6.9; hgb 12.;6 hct 38.0; mcv 94.4; plt 231; glucose 94; bun 10.0; creat 0.65; k+ 4.2; na++ 142; liver normal albumin 4.2; chol 177; lld 124; trig 101; hdl 33  06-14-16: wbc 8.4; hgb 11.9; hct 36.6; mcv 95.4; plt 203; glucose 134; bun 30.7; creat 0.62; k+ 4.5 ;na++ 149; liver normal albumin 4.2; vit B 12: 521; folate 19.1; chol  155; ldl 85; trig 181; hdl 34; hgb a1c 5.7   NO NEW LABS    Review of Systems  Unable to perform ROS: Other (aphasia )  Physical Exam  Constitutional: No distress.  Eyes: Conjunctivae are normal.  Neck: Neck supple. No JVD present. No thyromegaly present.  Cardiovascular: Normal rate, regular rhythm and intact distal pulses.   Respiratory: Effort normal and breath sounds normal. No respiratory distress. She has no wheezes.  GI: Soft. Bowel sounds are normal. She exhibits no distension. There is no tenderness.  Peg tube present; without signs of infection   Musculoskeletal: She exhibits no edema.  Has right hemiparesis   Lymphadenopathy:    She has no cervical adenopathy.  Neurological: She is alert.  Skin: Skin is warm and dry. She is not diaphoretic.  Psychiatric: She has a normal mood and affect.     ASSESSMENT/ PLAN:  TODAY  1. SAH:  2.  Weight loss:   3. Seizure:  4. DVT/PE:  Her care plan is currently up to date.  Will not make any changes; in her care plan  40 minutes spent with resident and and care plan: reviewing medical records; education of staff regarding her tube feeding and her weight gain; understanding verbalized.   MD is aware of resident's narcotic use and is in agreement with current plan of care. We will attempt to wean resident as apropriate    Synthia Innocent NP [facility]  Contact [phone] Monday through Friday 8am- 5pm  After hours call [phone]

## 2016-10-18 ENCOUNTER — Other Ambulatory Visit: Payer: Self-pay

## 2016-10-18 ENCOUNTER — Encounter: Payer: Self-pay | Admitting: Adult Health

## 2016-10-18 ENCOUNTER — Non-Acute Institutional Stay (SKILLED_NURSING_FACILITY): Payer: Medicaid Other | Admitting: Adult Health

## 2016-10-18 DIAGNOSIS — E1149 Type 2 diabetes mellitus with other diabetic neurological complication: Secondary | ICD-10-CM

## 2016-10-18 DIAGNOSIS — M21371 Foot drop, right foot: Secondary | ICD-10-CM

## 2016-10-18 MED ORDER — OXYCODONE HCL 5 MG PO TABS: ORAL_TABLET | ORAL | 0 refills | Status: DC

## 2016-10-18 NOTE — Progress Notes (Signed)
Location:   [facility] Room Number: 204 A Place of Service:  SNF (31)   CODE STATUS: Full Code  No Known Allergies  Chief Complaint  Patient presents with  . Medical Management of Chronic Issues    DM follow up    HPI:  She is a diet controlled diabetic. She presently does not take medications for her diabetes. Her hgb a1c is 5.6; with ldl 85. There are no reports of altered cbgs. Her right ankle and foot are tender to palpation and range of motion. There is no swelling present. She is unable to fully participate in the hpi ros   Past Medical History:  Diagnosis Date  . Acute pulmonary embolism (HCC) 02/19/2015  . Acute respiratory failure (HCC)   . Diabetes mellitus without complication (HCC)    Type 2, W/o complications  . DVT (deep venous thrombosis) (HCC) 04/05/2015  . Dysphagia   . Epilepsy (HCC)   . GERD (gastroesophageal reflux disease)   . Hyperlipidemia   . Hypertension   . IBS (irritable bowel syndrome)   . Nontraumatic subarachnoid hemorrhage (HCC)   . SAH (subarachnoid hemorrhage) (HCC)   . Urinary retention     Past Surgical History:  Procedure Laterality Date  . ABDOMINAL SURGERY    . ANEURYSM COILING    . COLONOSCOPY N/A 02/20/2015   Procedure: COLONOSCOPY;  Surgeon: Iva Boop, MD;  Location: [facility] ENDOSCOPY;  Service: Endoscopy;  Laterality: N/A;  . ESOPHAGOGASTRODUODENOSCOPY (EGD) WITH PROPOFOL N/A 02/02/2015   Procedure: ESOPHAGOGASTRODUODENOSCOPY (EGD) WITH PROPOFOL;  Surgeon: Jimmye Norman, MD;  Location: [facility] ENDOSCOPY;  Service: General;  Laterality: N/A;  . IR GASTRIC TUBE PERC CHG W/O IMG GUIDE  06/08/2016  . IR GENERIC HISTORICAL  08/26/2015   IR GASTRIC TUBE PERC CHG W/O IMG GUIDE 08/26/2015 Irish Lack, MD [facility]  . IR GENERIC HISTORICAL  09/14/2015   IR REPLC GASTRO/COLONIC TUBE PERCUT W/FLUORO 09/14/2015 Darrell K Allred, PA-C [facility]  . PEG PLACEMENT N/A 02/02/2015   Procedure: PERCUTANEOUS ENDOSCOPIC GASTROSTOMY (PEG)  PLACEMENT;  Surgeon: Jimmye Norman, MD;  Location: [facility] ENDOSCOPY;  Service: General;  Laterality: N/A;  . RADIOLOGY WITH ANESTHESIA N/A 01/13/2015   Procedure: RADIOLOGY WITH ANESTHESIA;  Surgeon: Lisbeth Renshaw, MD;  Location: [facility] OR;  Service: Radiology;  Laterality: N/A;  . TRACHEOSTOMY      Social History   Social History  . Marital status: Single    Spouse name: N/A  . Number of children: N/A  . Years of education: N/A   Occupational History  . Not on file.   Social History Main Topics  . Smoking status: Former Games developer  . Smokeless tobacco: Never Used  . Alcohol use No  . Drug use: No  . Sexual activity: Not on file   Other Topics Concern  . Not on file   Social History Narrative  . No narrative on file   History reviewed. No pertinent family history.    VITAL SIGNS BP 110/88   Pulse (!) 103   Temp (!) 97.4 F (36.3 C)   Resp (!) 22   Ht 5' (1.524 m)   Wt 115 lb (52.2 kg)   LMP  (LMP Unknown)   SpO2 95%   BMI 22.46 kg/m   Patient's Medications  New Prescriptions   No medications on file  Previous Medications   ACETAMINOPHEN (TYLENOL) 325 MG TABLET    Place 650 mg into feeding tube daily. And every 4 hours as needed for mild pain,fever  AMINO ACIDS-PROTEIN HYDROLYS (FEEDING SUPPLEMENT, PRO-STAT SUGAR FREE 64,) LIQD    Place 60 mLs into feeding tube 3 (three) times daily with meals.    AMLODIPINE (NORVASC) 5 MG TABLET    Place 5 mg into feeding tube daily.    BETHANECHOL (URECHOLINE) 10 MG TABLET    10 mg by PEG Tube route every 8 (eight) hours.    CLONIDINE (CATAPRES) 0.1 MG TABLET    Place 0.1 mg into feeding tube 2 (two) times daily.    ENOXAPARIN (LOVENOX) 60 MG/0.6ML INJECTION    Inject 60 mg into the skin 2 (two) times daily.    GABAPENTIN (NEURONTIN) 100 MG CAPSULE    Take 100 mg by mouth 2 (two) times daily.   HYDROCORTISONE CREAM 1 %    Apply 1 application topically 3 (three) times daily. Apply to abdomen for rash   LEVETIRACETAM (KEPPRA) 100  MG/ML SOLUTION    Place 5 mLs (500 mg total) into feeding tube 2 (two) times daily.   METOPROLOL (LOPRESSOR) 100 MG TABLET    Place 100 mg into feeding tube 2 (two) times daily.   METOPROLOL TARTRATE (LOPRESSOR) 25 MG TABLET    Place 25 mg into feeding tube 2 (two) times daily.   MULTIPLE VITAMINS-MINERALS (DECUBI-VITE) CAPS    Place 1 capsule into feeding tube daily.    NUTRITIONAL SUPPLEMENTS (FEEDING SUPPLEMENT, JEVITY 1.2 CAL,) LIQD    100 mls/hour, for 12 hours/day.  On at 8pm, off at 8am.  May substitute Jevity 1.5 for two cal until tow cal becomes available   ONDANSETRON (ZOFRAN) 4 MG TABLET    Take 4 mg by mouth daily.   OXYCODONE (OXY IR/ROXICODONE) 5 MG IMMEDIATE RELEASE TABLET    Give 1 tablet via PEG-Tube every 8 hours and every 4 hours as needed for pain   PANTOPRAZOLE SODIUM POWD    Give 1 packet (40 mg) via G-Tube one time a day for GERD   POLYETHYLENE GLYCOL (MIRALAX / GLYCOLAX) PACKET    Place 17 g into feeding tube daily.   TIZANIDINE (ZANAFLEX) 4 MG TABLET    Take 4 mg by mouth 3 (three) times daily. Hold for sedation   UNABLE TO FIND    HSG Puree diet - HSG Puree texture, Regular consistency, may have pleasure finger foods with supervision   UNABLE TO FIND    Enternal Feet Order - Give 200 ml if water via G-Tube every 4 hours   WATER FOR INJECTION STERILE IJ    Place 30-60 mLs into feeding tube. Flush with 30-60 ml water before and after meds, before initiating feedings or when there is an interruption of feeding to maintain patency.  Also flush every shift with 5-10 ml water between each medication  Modified Medications   No medications on file  Discontinued Medications   No medications on file     SIGNIFICANT DIAGNOSTIC EXAMS  PREVIOUS  02-17-15: ct angio of chest: 1. Extensive pulmonary emboli involving distal main pulmonary arteries extending into lobar, segmental and subsegmental sized branches throughout the lungs bilaterally. At this time, there are no overt findings  to suggest right heart strain. 2. Cardiomegaly with left ventricular concentric hypertrophy. 3. Dependent atelectasis throughout the lower lobes of the lungs bilaterally.  02-18-15: bilateral lower extremity doppler: Findings consistent with acute deep vein thrombosis involving the left common femoral vein, left proximal profunda femoris vein, and left proximal femoral vein. Incidental findings are consistent with: enlarged lymph node on the right. - No evidence  of deep vein thrombosis involving the right lower extremity. - No evidence of Baker&'s cyst on the right or left.  02-19-15: pelvic ultrasound: 1. Uterus not identified, presumed hysterectomy. No mass or free fluid seen within the midline pelvis. 2. Neither ovary is seen, perhaps bilateral oophorectomies, perhaps obscured by the fairly prominent fluid-filled bowel loops in the pelvis. No mass or free fluid seen within either adnexal region.  06-14-16: diagnostic right mammogram: benign cyst  08-02-16: left upper extremity doppler: negative for dvt  08-02-16: left hand x-ray: mild osteoarthritis   08-15-16: left hand x-ray: mild osteoarthritis no change   09-04-16: chest x-ray:  cardio retrocardiac opacities  NO NEW EXAMS     LABS REVIEWED: PREVIOUS    11-02-15: wbc 8.2; hgb 11.8; hct 37.3; mcv 89.3; plt 220; glucose 109; bun 16.6; creat 0.88; k+ 5.2; na++ 136 11-23-15: wbc 6.3; hgb 13.8; hct 43.9; mcv 96.8; plt 225; glucose 92; bun 27.6; creat 0.86; k+ 4.3; na++ 148  04-26-16: wbc 6.9; hgb 12.;6 hct 38.0; mcv 94.4; plt 231; glucose 94; bun 10.0; creat 0.65; k+ 4.2; na++ 142; liver normal albumin 4.2; chol 177; lld 124; trig 101; hdl 33  06-14-16: wbc 8.4; hgb 11.9; hct 36.6; mcv 95.4; plt 203; glucose 134; bun 30.7; creat 0.62; k+ 4.5 ;na++ 149; liver normal albumin 4.2; vit B 12: 521; folate 19.1; chol 155; ldl 85; trig 181; hdl 34; hgb a1c 5.7   NO NEW LABS  Review of Systems  Unable to perform ROS: Other (has aphasia)    Physical  Exam  Constitutional: No distress.  Eyes: Conjunctivae are normal.  Neck: Neck supple. No JVD present. No thyromegaly present.  Cardiovascular: Normal rate, regular rhythm and intact distal pulses.   Respiratory: Effort normal and breath sounds normal. No respiratory distress. She has no wheezes.  GI: Soft. Bowel sounds are normal. She exhibits no distension. There is no tenderness.  Peg tube present without signs of infection present   Musculoskeletal: She exhibits no edema.  Right hemiparesis Right foot pain is tender to palpation and range of motion is guarded   Lymphadenopathy:    She has no cervical adenopathy.  Neurological: She is alert.  Skin: Skin is warm and dry. She is not diaphoretic.  Psychiatric: She has a normal mood and affect.     ASSESSMENT/ PLAN:  1. Diabetes: is stable  hgb a1c is 5.7 (previous 5.5) ; is currently off medications; will continue to monitor her status.  ldl is 85   2. Drop foot: will have therapy evaluate and treat as indicated for supportive devices.  Diabetic foot exam performed.      MD is aware of resident's narcotic use and is in agreement with current plan of care. We will attempt to wean resident as apropriate   Synthia Innocent NP [facility]  Contact [phone] Monday through Friday 8am- 5pm  After hours call [phone]

## 2016-10-18 NOTE — Telephone Encounter (Signed)
RX faxed to [facility] @ [phone], phone number [phone] 

## 2016-10-22 LAB — HEPATIC FUNCTION PANEL
ALT: 11 (ref 7–35)
AST: 11 — AB (ref 13–35)
Alkaline Phosphatase: 128 — AB (ref 25–125)
Bilirubin, Total: 0.2

## 2016-10-22 LAB — BASIC METABOLIC PANEL
BUN: 29 — AB (ref 4–21)
Creatinine: 0.6 (ref 0.5–1.1)
Glucose: 131
POTASSIUM: 4.7 (ref 3.4–5.3)
SODIUM: 141 (ref 137–147)

## 2016-10-23 ENCOUNTER — Other Ambulatory Visit: Payer: Self-pay

## 2016-10-23 MED ORDER — OXYCODONE HCL 5 MG PO TABS: 5.0000 mg | ORAL_TABLET | ORAL | 0 refills | Status: DC | PRN

## 2016-10-23 NOTE — Telephone Encounter (Signed)
RX faxed to [facility] @ [phone], phone number [phone] 

## 2016-10-24 ENCOUNTER — Emergency Department (HOSPITAL_COMMUNITY)
Admission: EM | Admit: 2016-10-24 | Discharge: 2016-10-24 | Disposition: A | Discharge status: Home / Self Care | Payer: Medicaid Other | Attending: Emergency Medicine | Admitting: Emergency Medicine

## 2016-10-24 ENCOUNTER — Emergency Department (HOSPITAL_COMMUNITY): Payer: Medicaid Other

## 2016-10-24 ENCOUNTER — Encounter (HOSPITAL_COMMUNITY): Payer: Self-pay

## 2016-10-24 DIAGNOSIS — Z87891 Personal history of nicotine dependence: Secondary | ICD-10-CM | POA: Insufficient documentation

## 2016-10-24 DIAGNOSIS — K9423 Gastrostomy malfunction: Secondary | ICD-10-CM | POA: Diagnosis not present

## 2016-10-24 DIAGNOSIS — Z79899 Other long term (current) drug therapy: Secondary | ICD-10-CM | POA: Insufficient documentation

## 2016-10-24 DIAGNOSIS — I10 Essential (primary) hypertension: Secondary | ICD-10-CM | POA: Insufficient documentation

## 2016-10-24 DIAGNOSIS — E1149 Type 2 diabetes mellitus with other diabetic neurological complication: Secondary | ICD-10-CM | POA: Diagnosis not present

## 2016-10-24 HISTORY — PX: IR REPLACE G-TUBE SIMPLE WO FLUORO: IMG2323

## 2016-10-24 LAB — CBG MONITORING, ED: GLUCOSE-CAPILLARY: 105 mg/dL — AB (ref 65–99)

## 2016-10-24 MED ORDER — BETHANECHOL 1 MG/ML PEDIATRIC ORAL SUSPENSION: 10.0000 mg | Freq: Once | ORAL | Status: DC

## 2016-10-24 MED ORDER — OXYCODONE HCL 5 MG/5ML PO SOLN: 5.0000 mg | Freq: Once | ORAL | Status: DC

## 2016-10-24 MED ORDER — BETHANECHOL CHLORIDE 10 MG PO TABS
10.0000 mg | ORAL_TABLET | Freq: Once | ORAL | Status: DC
  Filled 2016-10-24: qty 1

## 2016-10-24 MED ORDER — OXYCODONE HCL 5 MG/5ML PO SOLN
5.0000 mg | Freq: Once | ORAL | Status: AC
  Administered 2016-10-24: 5 mg
  Filled 2016-10-24: qty 5

## 2016-10-24 MED ORDER — BETHANECHOL CHLORIDE 10 MG PO TABS
10.0000 mg | ORAL_TABLET | Freq: Once | ORAL | Status: AC
  Administered 2016-10-24: 10 mg
  Filled 2016-10-24: qty 1

## 2016-10-24 MED ORDER — LEVETIRACETAM 100 MG/ML PO SOLN
500.0000 mg | Freq: Once | ORAL | Status: AC
  Administered 2016-10-24: 500 mg
  Filled 2016-10-24: qty 5

## 2016-10-24 MED ORDER — LEVETIRACETAM 100 MG/ML PO SOLN
500.0000 mg | Freq: Once | ORAL | Status: DC
  Filled 2016-10-24: qty 5

## 2016-10-24 NOTE — ED Provider Notes (Signed)
[facility]-EMERGENCY DEPT Provider Note   CSN: [id number] Arrival date & time: 10/24/16  1303     History   Chief Complaint No chief complaint on file.   HPI Rachel Vang is a 51 y.o. female.  HPI   51 yo F with extensive PMHx as below here with leaking G tube. Pt has 64F G-Tube in place. It reportedly started leaking from a crack in the external tubing yesterday. It has been leaking since. Pt is o/w without complaints. She denies any abdominal pain. No recent trauma to the area. No vomiting. Of note, she has not received her meds today.  Level 5 caveat invoked as remainder of history, ROS, and physical exam limited due to patient's aphasia, mental status.   Past Medical History:  Diagnosis Date  . Acute pulmonary embolism (HCC) 02/19/2015  . Acute respiratory failure (HCC)   . Diabetes mellitus without complication (HCC)    Type 2, W/o complications  . DVT (deep venous thrombosis) (HCC) 04/05/2015  . Dysphagia   . Epilepsy (HCC)   . GERD (gastroesophageal reflux disease)   . Hyperlipidemia   . Hypertension   . IBS (irritable bowel syndrome)   . Nontraumatic subarachnoid hemorrhage (HCC)   . SAH (subarachnoid hemorrhage) (HCC)   . Urinary retention     Patient Active Problem List   Diagnosis Date Noted  . Foot drop, right foot 10/18/2016  . Cough, persistent 09/17/2016  . CVA (cerebrovascular accident) (HCC) 08/30/2016  . GERD without esophagitis 08/30/2016  . History of stroke 08/16/2016  . Localized swelling on left hand 08/16/2016  . Pain and swelling of left wrist 08/16/2016  . Chronic pulmonary embolism (HCC) 07/03/2016  . Weight loss, non-intentional 06/13/2016  . Hemiparesis affecting right side as late effect of stroke (HCC) 09/25/2015  . Type II diabetes mellitus with neurological manifestations (HCC) 05/20/2015  . Essential hypertension, benign 05/20/2015  . S/P percutaneous endoscopic gastrostomy (PEG) tube placement (HCC) 05/20/2015  . DVT (deep venous  thrombosis) (HCC) 04/05/2015  . Protein-calorie malnutrition, severe (HCC) 03/25/2015  . Dysphagia 02/18/2015  . Seizures (HCC) 02/18/2015  . Urine retention 02/18/2015  . History of ETT   . Subarachnoid hemorrhage [facility])     Past Surgical History:  Procedure Laterality Date  . ABDOMINAL SURGERY    . ANEURYSM COILING    . COLONOSCOPY N/A 02/20/2015   Procedure: COLONOSCOPY;  Surgeon: Iva Boop, MD;  Location[facility] ENDOSCOPY;  Service: Endoscopy;  Laterality: N/A;  . ESOPHAGOGASTRODUODENOSCOPY (EGD) WITH PROPOFOL N/A 02/02/2015   Procedure: ESOPHAGOGASTRODUODENOSCOPY (EGD) WITH PROPOFOL;  Surgeon: Jimmye Norman, MD;  Location: [facility] ENDOSCOPY;  Service: General;  Laterality: N/A;  . IR GASTRIC TUBE PERC CHG W/O IMG GUIDE  06/08/2016  . IR GASTRIC TUBE PERC CHG W/O IMG GUIDE  10/24/2016  . IR GASTRIC TUBE PERC CHG W/O IMG GUIDE  10/24/2016  . IR GENERIC HISTORICAL  08/26/2015   IR GASTRIC TUBE PERC CHG W/O IMG GUIDE 08/26/2015 Irish Lack, MD [facility]-INTERV RAD  . IR GENERIC HISTORICAL  09/14/2015   IR REPLC GASTRO/COLONIC TUBE PERCUT W/FLUORO 09/14/2015 Darrell K Allred, PA-C [facility]-INTERV RAD  . PEG PLACEMENT N/A 02/02/2015   Procedure: PERCUTANEOUS ENDOSCOPIC GASTROSTOMY (PEG) PLACEMENT;  Surgeon: Jimmye Norman, MD;  Location: [facility] ENDOSCOPY;  Service: General;  Laterality: N/A;  . RADIOLOGY WITH ANESTHESIA N/A 01/13/2015   Procedure: RADIOLOGY WITH ANESTHESIA;  Surgeon: Lisbeth Renshaw, MD;  Location: [facility] OR;  Service: Radiology;  Laterality: N/A;  . TRACHEOSTOMY  OB History    No data available       Home Medications    Prior to Admission medications   Medication Sig Start Date End Date Taking? Authorizing Provider  acetaminophen (TYLENOL) 325 MG tablet Place 650 mg into feeding tube daily. And every 4 hours as needed for mild pain,fever   Yes [provider]  Amino Acids-Protein Hydrolys (FEEDING SUPPLEMENT, PRO-STAT SUGAR FREE 64,) LIQD Place 60 mLs into feeding tube 3 (three)  times daily with meals.    Yes [provider]  amLODipine (NORVASC) 5 MG tablet Place 5 mg into feeding tube daily.    Yes [provider]  bethanechol (URECHOLINE) 10 MG tablet 10 mg by PEG Tube route every 8 (eight) hours.    Yes [provider]  cloNIDine (CATAPRES) 0.1 MG tablet Place 0.1 mg into feeding tube 2 (two) times daily.    Yes [provider]  enoxaparin (LOVENOX) 60 MG/0.6ML injection Inject 60 mg into the skin 2 (two) times daily.    Yes [provider]  gabapentin (NEURONTIN) 100 MG capsule Take 100 mg by mouth 2 (two) times daily.   Yes [provider]  hydrocortisone cream 1 % Apply 1 application topically 3 (three) times daily. Apply to abdomen for rash   Yes [provider]  levETIRAcetam (KEPPRA) 100 MG/ML solution Place 5 mLs (500 mg total) into feeding tube 2 (two) times daily. 02/11/15  Yes Leroy Sea, MD  metoprolol (LOPRESSOR) 100 MG tablet Place 100 mg into feeding tube 2 (two) times daily.   Yes [provider]  metoprolol tartrate (LOPRESSOR) 25 MG tablet Place 25 mg into feeding tube 2 (two) times daily.   Yes [provider]  Multiple Vitamins-Minerals (DECUBI-VITE) CAPS Place 1 capsule into feeding tube daily.    Yes [provider]  ondansetron (ZOFRAN) 4 MG tablet Take 4 mg by mouth daily.   Yes [provider]  oxyCODONE (OXY IR/ROXICODONE) 5 MG immediate release tablet Give 1 tablet via PEG-Tube every 8 hours and every 4 hours as needed for pain 10/18/16  Yes Sharee Holster, NP  Pantoprazole Sodium POWD Give 1 packet (40 mg) via G-Tube one time a day for GERD   Yes [provider]  polyethylene glycol (MIRALAX / GLYCOLAX) packet Place 17 g into feeding tube daily.   Yes [provider]  UNABLE TO FIND Enternal Feet Order - Give 200 ml if water via G-Tube every 4 hours   Yes [provider]  WATER FOR INJECTION STERILE IJ Place 30-60  mLs into feeding tube. Flush with 30-60 ml water before and after meds, before initiating feedings or when there is an interruption of feeding to maintain patency.  Also flush every shift with 5-10 ml water between each medication   Yes [provider]  Nutritional Supplements (FEEDING SUPPLEMENT, JEVITY 1.2 CAL,) LIQD 100 mls/hour, for 12 hours/day.  On at 8pm, off at 8am.  May substitute Jevity 1.5 for two cal until tow cal becomes available    [provider]  oxyCODONE (ROXICODONE) 5 MG immediate release tablet Place 1 tablet (5 mg total) into feeding tube every 4 (four) hours as needed for severe pain. 10/23/16   Sharee Holster, NP  UNABLE TO FIND HSG Puree diet - HSG Puree texture, Regular consistency, may have pleasure finger foods with supervision    [provider]    Family History History reviewed. No pertinent family history.  Social  History Social History  Substance Use Topics  . Smoking status: Former Games developer  . Smokeless tobacco: Never Used  . Alcohol use No     Allergies   Patient has no known allergies.   Review of Systems Review of Systems  Constitutional: Negative for chills and fever.  HENT: Negative for congestion, rhinorrhea and sore throat.   Eyes: Negative for visual disturbance.  Respiratory: Negative for cough, shortness of breath and wheezing.   Cardiovascular: Negative for chest pain and leg swelling.  Gastrointestinal: Negative for abdominal pain, diarrhea, nausea and vomiting.  Genitourinary: Negative for dysuria, flank pain, vaginal bleeding and vaginal discharge.  Musculoskeletal: Negative for neck pain.  Skin: Negative for rash.  Allergic/Immunologic: Negative for immunocompromised state.  Neurological: Negative for syncope and headaches.  Hematological: Does not bruise/bleed easily.  All other systems reviewed and are negative.    Physical Exam Updated Vital Signs BP 122/75 (BP Location: Right Arm)   Pulse 70    Temp 98 F (36.7 C) (Oral)   Resp 18   LMP  (LMP Unknown)   SpO2 99%   Physical Exam  Constitutional: She is oriented to person, place, and time. She appears well-developed and well-nourished. No distress.  HENT:  Head: Normocephalic and atraumatic.  Eyes: Conjunctivae are normal.  Neck: Neck supple.  Cardiovascular: Normal rate, regular rhythm and normal heart sounds.   Pulmonary/Chest: Effort normal. No respiratory distress. She has no wheezes.  Abdominal: She exhibits no distension.  G-Tube site c/d/i. The external portion of the G tube catheter has a nick/breakdown along external portion. No trauma at the skin.  Musculoskeletal: She exhibits no edema.  Neurological: She is alert and oriented to person, place, and time. She exhibits normal muscle tone.  Skin: Skin is warm. Capillary refill takes less than 2 seconds. No rash noted.  Nursing note and vitals reviewed.    ED Treatments / Results  Labs (all labs ordered are listed, but only abnormal results are displayed) Labs Reviewed  CBG MONITORING, ED - Abnormal; Notable for the following:       Result Value   Glucose-Capillary 105 (*)    All other components within normal limits    EKG  EKG Interpretation None       Radiology Ir Gastric Tube Perc Chg W/o Img Guide  Result Date: 10/24/2016 INDICATION: Patient with history of puncture hole and subsequent leaking at external portion of gastrostomy tube . Request now made for gastrostomy tube exchange. EXAM: GASTROSTOMY TUBE EXCHANGE MEDICATIONS: None ANESTHESIA/SEDATION: None CONTRAST:  None FLUOROSCOPY TIME:  None COMPLICATIONS: None immediate. PROCEDURE: Informed consent was obtained from the patient after a thorough discussion of the procedural risks, benefits and alternatives. The gastrostomy tube balloon was deflated and the existing tube was removed in its entirety. A new 16 French balloon retention gastrostomy tube was inserted and approximately 5 cc of saline placed  into balloon . The tube was secured to the skin site. IMPRESSION: Successful exchange of existing 97 French balloon retention gastrostomy tube for new 16 French balloon retention gastrostomy tube. Read by: Jeananne Rama, PA-C Electronically Signed   By: Malachy Moan M.D.   On: 10/24/2016 16:37   Ir Gastric Tube Perc Chg W/o Img Guide  Result Date: 10/24/2016 Laqueta Due, PA-C     10/24/2016  2:29 PM Patient's existing 16 French balloon retention gastrostomy tube was exchanged out for new 16 French balloon retention gastrostomy tube secondary to leaking hole in outer tubing.No immediate complications.  Procedures Procedures (including critical care time)  Medications Ordered in ED Medications  bethanechol (URECHOLINE) tablet 10 mg (10 mg Per Tube Given 10/24/16 1447)  levETIRAcetam (KEPPRA) 100 MG/ML solution 500 mg (500 mg Per Tube Given 10/24/16 1447)  oxyCODONE (ROXICODONE) 5 MG/5ML solution 5 mg (5 mg Per Tube Given 10/24/16 1447)     Initial Impression / Assessment and Plan / ED Course  I have reviewed the triage vital signs and the nursing notes.  Pertinent labs & imaging results that were available during my care of the patient were reviewed by me and considered in my medical decision making (see chart for details).    51 yo F with PMHx as above here with leaking from nick/crack in G tube site. Discussed with IR as they have replaced tube multiple times in place. Tube replaced at bedside, tolerated well. Home AED and meds given. D/c with outpt follow-up.  Final Clinical Impressions(s) / ED Diagnoses   Final diagnoses:  Malfunction of gastrostomy tube [facility])    New Prescriptions Discharge Medication List as of 10/24/2016  2:43 PM       Shaune Pollack, MD 10/24/16 2325

## 2016-10-24 NOTE — ED Notes (Signed)
IR physician changed out patient G-tube at bedside.

## 2016-10-24 NOTE — ED Triage Notes (Signed)
Patient BIB EMS from [facility] on [address]. Patient was sent to [facility] due to "split/leaking G-tube." Patient's last documented feeding was last night. Patient has history of diabetes, epilepsy, essential hypertension, and dysphagia. Patient denies pain at this time.

## 2016-10-24 NOTE — ED Notes (Signed)
[facility] has been called regarding patient's transport back to [facility]. Report called to Laney Potash, RN at [facility].

## 2016-10-24 NOTE — Procedures (Deleted)
Patient's existing 16 French balloon retention gastrostomy tube was exchanged out for new 16 French balloon retention gastrostomy tube secondary to leaking hole in outer tubing.No immediate complications.

## 2016-10-24 NOTE — ED Notes (Signed)
Bed: WA06 Expected date:  Expected time:  Means of arrival:  Comments: G-tube replacement

## 2016-10-25 ENCOUNTER — Encounter (HOSPITAL_COMMUNITY): Payer: Self-pay

## 2016-10-27 NOTE — Progress Notes (Signed)
Location:   [facility]    Place of Service:  SNF (31)   CODE STATUS: full code   No Known Allergies  Chief Complaint  Patient presents with  . Acute Visit    care plan meeting     HPI:  We have come together to have her quarterly care plan meeting. Her family is unable to attend. Her care plan has been reviewed. Her weight is doing well; and is beginning to gain weight. She is allowing for her tube feedings nightly.   Past Medical History:  Diagnosis Date  . Acute pulmonary embolism (HCC) 02/19/2015  . Acute respiratory failure (HCC)   . Diabetes mellitus without complication (HCC)    Type 2, W/o complications  . DVT (deep venous thrombosis) (HCC) 04/05/2015  . Dysphagia   . Epilepsy (HCC)   . GERD (gastroesophageal reflux disease)   . Hyperlipidemia   . Hypertension   . IBS (irritable bowel syndrome)   . Nontraumatic subarachnoid hemorrhage (HCC)   . SAH (subarachnoid hemorrhage) (HCC)   . Urinary retention     Past Surgical History:  Procedure Laterality Date  . ABDOMINAL SURGERY    . ANEURYSM COILING    . COLONOSCOPY N/A 02/20/2015   Procedure: COLONOSCOPY;  Surgeon: Iva Boop, MD;  Location: [facility] ENDOSCOPY;  Service: Endoscopy;  Laterality: N/A;  . ESOPHAGOGASTRODUODENOSCOPY (EGD) WITH PROPOFOL N/A 02/02/2015   Procedure: ESOPHAGOGASTRODUODENOSCOPY (EGD) WITH PROPOFOL;  Surgeon: Jimmye Norman, MD;  Location: [facility] ENDOSCOPY;  Service: General;  Laterality: N/A;  . IR GASTRIC TUBE PERC CHG W/O IMG GUIDE  06/08/2016  . IR GASTRIC TUBE PERC CHG W/O IMG GUIDE  10/24/2016  . IR GENERIC HISTORICAL  08/26/2015   IR GASTRIC TUBE PERC CHG W/O IMG GUIDE 08/26/2015 Irish Lack, MD [facility]-INTERV RAD  . IR GENERIC HISTORICAL  09/14/2015   IR REPLC GASTRO/COLONIC TUBE PERCUT W/FLUORO 09/14/2015 Darrell K Allred, PA-C [facility]-INTERV RAD  . PEG PLACEMENT N/A 02/02/2015   Procedure: PERCUTANEOUS ENDOSCOPIC GASTROSTOMY (PEG) PLACEMENT;  Surgeon: Jimmye Norman, MD;  Location: [facility] ENDOSCOPY;  Service:  General;  Laterality: N/A;  . RADIOLOGY WITH ANESTHESIA N/A 01/13/2015   Procedure: RADIOLOGY WITH ANESTHESIA;  Surgeon: Lisbeth Renshaw, MD;  Location: [facility] OR;  Service: Radiology;  Laterality: N/A;  . TRACHEOSTOMY      Social History   Social History  . Marital status: Single    Spouse name: N/A  . Number of children: N/A  . Years of education: N/A   Occupational History  . Not on file.   Social History Main Topics  . Smoking status: Former Games developer  . Smokeless tobacco: Never Used  . Alcohol use No  . Drug use: No  . Sexual activity: Not on file   Other Topics Concern  . Not on file   Social History Narrative  . No narrative on file   No family history on file.    VITAL SIGNS BP 122/67   Pulse 70   Resp 14   Ht 5' (1.524 m)   Wt 115 lb (52.2 kg)   LMP  (LMP Unknown)   BMI 22.46 kg/m   Patient's Medications  New Prescriptions   No medications on file  Previous Medications   ACETAMINOPHEN (TYLENOL) 325 MG TABLET    Place 650 mg into feeding tube daily. And every 4 hours as needed for mild pain,fever   AMINO ACIDS-PROTEIN HYDROLYS (FEEDING SUPPLEMENT, PRO-STAT SUGAR FREE 64,) LIQD    Place 60 mLs into feeding tube 3 (  three) times daily with meals.    AMLODIPINE (NORVASC) 5 MG TABLET    Place 5 mg into feeding tube daily.    BETHANECHOL (URECHOLINE) 10 MG TABLET    10 mg by PEG Tube route every 8 (eight) hours.    CLONIDINE (CATAPRES) 0.1 MG TABLET    Place 0.1 mg into feeding tube 2 (two) times daily.    ENOXAPARIN (LOVENOX) 60 MG/0.6ML INJECTION    Inject 60 mg into the skin 2 (two) times daily.    GABAPENTIN (NEURONTIN) 100 MG CAPSULE    Take 100 mg by mouth 2 (two) times daily.   HYDROCORTISONE CREAM 1 %    Apply 1 application topically 3 (three) times daily. Apply to abdomen for rash   LEVETIRACETAM (KEPPRA) 100 MG/ML SOLUTION    Place 5 mLs (500 mg total) into feeding tube 2 (two) times daily.   METOPROLOL (LOPRESSOR) 100 MG TABLET    Place 100 mg into  feeding tube 2 (two) times daily.   METOPROLOL TARTRATE (LOPRESSOR) 25 MG TABLET    Place 25 mg into feeding tube 2 (two) times daily.   MULTIPLE VITAMINS-MINERALS (DECUBI-VITE) CAPS    Place 1 capsule into feeding tube daily.    NUTRITIONAL SUPPLEMENTS (FEEDING SUPPLEMENT, JEVITY 1.2 CAL,) LIQD    100 mls/hour, for 12 hours/day.  On at 8pm, off at 8am.  May substitute Jevity 1.5 for two cal until tow cal becomes available   ONDANSETRON (ZOFRAN) 4 MG TABLET    Take 4 mg by mouth daily.   OXYCODONE (OXY IR/ROXICODONE) 5 MG IMMEDIATE RELEASE TABLET    Give 1 tablet via PEG-Tube every 8 hours and every 4 hours as needed for pain   OXYCODONE (ROXICODONE) 5 MG IMMEDIATE RELEASE TABLET    Place 1 tablet (5 mg total) into feeding tube every 4 (four) hours as needed for severe pain.   PANTOPRAZOLE SODIUM POWD    Give 1 packet (40 mg) via G-Tube one time a day for GERD   POLYETHYLENE GLYCOL (MIRALAX / GLYCOLAX) PACKET    Place 17 g into feeding tube daily.   UNABLE TO FIND    HSG Puree diet - HSG Puree texture, Regular consistency, may have pleasure finger foods with supervision   UNABLE TO FIND    Enternal Feet Order - Give 200 ml if water via G-Tube every 4 hours   WATER FOR INJECTION STERILE IJ    Place 30-60 mLs into feeding tube. Flush with 30-60 ml water before and after meds, before initiating feedings or when there is an interruption of feeding to maintain patency.  Also flush every shift with 5-10 ml water between each medication  Modified Medications   No medications on file  Discontinued Medications   No medications on file     SIGNIFICANT DIAGNOSTIC EXAMS       ASSESSMENT/ PLAN:    MD is aware of resident's narcotic use and is in agreement with current plan of care. We will attempt to wean resident as apropriate   Synthia Innocent NP [facility] Adult Medicine  Contact [phone] Monday through Friday 8am- 5pm  After hours call [phone]    This encounter was created in error  - please disregard.

## 2016-10-29 ENCOUNTER — Other Ambulatory Visit: Payer: Self-pay

## 2016-10-29 MED ORDER — OXYCODONE HCL 5 MG PO TABS: 5.0000 mg | ORAL_TABLET | ORAL | 0 refills | Status: DC | PRN

## 2016-10-29 NOTE — Telephone Encounter (Signed)
RX faxed to [facility] @ [phone], phone number [phone] 

## 2016-11-12 ENCOUNTER — Other Ambulatory Visit: Payer: Self-pay

## 2016-11-12 MED ORDER — OXYCODONE HCL 5 MG PO TABS: ORAL_TABLET | ORAL | 0 refills | Status: DC

## 2016-11-26 ENCOUNTER — Encounter: Payer: Self-pay | Admitting: Internal Medicine

## 2016-11-26 ENCOUNTER — Non-Acute Institutional Stay (SKILLED_NURSING_FACILITY): Payer: Medicaid Other | Admitting: Internal Medicine

## 2016-11-26 DIAGNOSIS — M659 Synovitis and tenosynovitis, unspecified: Secondary | ICD-10-CM | POA: Diagnosis not present

## 2016-11-26 DIAGNOSIS — E1149 Type 2 diabetes mellitus with other diabetic neurological complication: Secondary | ICD-10-CM

## 2016-11-26 DIAGNOSIS — M545 Low back pain: Secondary | ICD-10-CM | POA: Diagnosis not present

## 2016-11-26 DIAGNOSIS — R339 Retention of urine, unspecified: Secondary | ICD-10-CM | POA: Diagnosis not present

## 2016-11-26 DIAGNOSIS — I825Y2 Chronic embolism and thrombosis of unspecified deep veins of left proximal lower extremity: Secondary | ICD-10-CM | POA: Diagnosis not present

## 2016-11-26 DIAGNOSIS — I69351 Hemiplegia and hemiparesis following cerebral infarction affecting right dominant side: Secondary | ICD-10-CM | POA: Diagnosis not present

## 2016-11-26 DIAGNOSIS — Z8673 Personal history of transient ischemic attack (TIA), and cerebral infarction without residual deficits: Secondary | ICD-10-CM

## 2016-11-26 NOTE — Progress Notes (Addendum)
Patient ID: Rachel Vang, female   DOB: 01/05/1966, 51 y.o.   MRN: [id number]     DATE:  November 26, 2016  Location:   [facility] Room Number: 204 A Place of Service: SNF (31)   Extended Emergency Contact Information Primary Emergency Contact: Masaki,Carlton  [place] Mobile Phone: [phone] Relation: Son Secondary Emergency Contact: Debroah [place] Mobile Phone: [phone] Relation: Son  Gaffer Does Patient Have a Programmer, multimedia?: Yes, Type of Advance Directive: Out of facility DNR (pink MOST or yellow form), Pre-existing out of facility DNR order (yellow form or pink MOST form): Pink MOST form placed in chart (order not valid for inpatient use), Does patient want to make changes to medical advance directive?: No - Patient declined  Chief Complaint  Patient presents with  . Medical Management of Chronic Issues    1 month follow up for Anemia, Type 2 Diabetes Mellitus, Hypertension, IBS, Epilepsy, GERD, Hyperlipidemia,     HPI:  51 yo female long term resident seen today for f/u. She c/o LUE and lower back pain today. No change in urinary habits. No nursing issues. No falls. She is a poor historian due to expressive aphasia. Hx obtained from chart  Hx SAH - she has right hemiparesis and expressive aphasia. No new sx's.  Chronic back pain - no new numbness/tingling. Pain is controlled on oxycodone IR and gabapentin  Recurrent UTI/urine retention - no recent urine retention. She does c/o LBP today  Weight loss - stable on TF. Weight 122 lb. Albumin 4.3  DM - diet controlled. A1c 5.7%. CBGs 80-160s. No low BS reactions. LDL 85. She has neuropathy and takes gabapentin. 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 Acute pulmonary embolism (HCC) 02/19/2015  . Acute respiratory failure (HCC)   . Diabetes mellitus without complication (HCC)    Type 2, W/o complications  . DVT (deep venous thrombosis) (HCC) 04/05/2015  . Dysphagia   . Epilepsy (HCC)   . GERD (gastroesophageal reflux disease)   . Hyperlipidemia   . Hypertension   . IBS (irritable bowel syndrome)   . Nontraumatic subarachnoid hemorrhage (HCC)   . SAH (subarachnoid hemorrhage) (HCC)   . Urinary retention     Past Surgical History:  Procedure Laterality Date  . ABDOMINAL SURGERY    . ANEURYSM COILING    . COLONOSCOPY N/A 02/20/2015   Procedure: COLONOSCOPY;  Surgeon: Iva Boop, MD;  Location: [facility] ENDOSCOPY;  Service: Endoscopy;  Laterality: N/A;  . ESOPHAGOGASTRODUODENOSCOPY (EGD) WITH PROPOFOL N/A 02/02/2015   Procedure: ESOPHAGOGASTRODUODENOSCOPY (EGD) WITH PROPOFOL;  Surgeon: Jimmye Norman, MD;  Location: [facility] ENDOSCOPY;  Service: General;  Laterality: N/A;  . IR GASTRIC TUBE PERC CHG W/O IMG GUIDE  06/08/2016  . IR GASTRIC TUBE PERC CHG W/O IMG GUIDE  10/24/2016  . IR GENERIC HISTORICAL  08/26/2015  IR GASTRIC TUBE PERC CHG W/O IMG GUIDE 08/26/2015 Irish Lack, MD [facility]INTER[facility] RAD  . IR GENERIC HISTORICAL  09/14/2015   IR REPLC GASTRO/COLONIC TUBE PERCUT W/FLUORO 09/14/2015 Darrell K Allred, PA-C [facility]INTER[facility]  . PEG PLACEMENT N/A 02/02/2015   Procedure: PERCUTANEOUS ENDOSCOPIC GASTROSTOMY (PEG) PLACEMENT;  Surgeon: Jimmye Norman, MD;  Location: [facility] ENDOSCOPY;  Service: General;  Laterality: N/A;  . RADIOLOGY WITH ANESTHESIA N/A 01/13/2015   Procedure: RADIOLOGY WITH ANESTHESIA;   Surgeon: Lisbeth Renshaw, MD;  Location: [facility] OR;  Service: Radiology;  Laterality: N/A;  . TRACHEOSTOMY      Patient Care Team: Kirt Boys, DO as PCP - General (Internal Medicine) Chilton Si Chong Sicilian, NP as Nurse Practitioner (Geriatric Medicine) Center, [facility])  Social History   Social History  . Marital status: Single    Spouse name: N/A  . Number of children: N/A  . Years of education: N/A   Occupational History  . Not on file.   Social History Main Topics  . Smoking status: Former Games developer  . Smokeless tobacco: Never Used  . Alcohol use No  . Drug use: No  . Sexual activity: Not on file   Other Topics Concern  . Not on file   Social History Narrative  . No narrative on file     reports that she has quit smoking. She has never used smokeless tobacco. She reports that she does not drink alcohol or use drugs.  History reviewed. No pertinent family history. No family status information on file.    Immunization History  Administered Date(s) Administered  . Influenza-Unspecified 03/09/2015, 11/09/2015, 11/12/2016  . PPD Test 10/07/2015, 10/14/2015    No Known Allergies  Medications: Patient's Medications  New Prescriptions   No medications on file  Previous Medications   ACETAMINOPHEN (TYLENOL) 325 MG TABLET    Place 650 mg into feeding tube daily. And every 4 hours as needed for mild pain,fever   AMINO ACIDS-PROTEIN HYDROLYS (FEEDING SUPPLEMENT, PRO-STAT SUGAR FREE 64,) LIQD    Place 60 mLs into feeding tube 3 (three) times daily with meals.    AMLODIPINE (NORVASC) 5 MG TABLET    Place 5 mg into feeding tube daily.    BETHANECHOL (URECHOLINE) 10 MG TABLET    10 mg by PEG Tube route every 8 (eight) hours.    CLONIDINE (CATAPRES) 0.1 MG TABLET    Place 0.1 mg into feeding tube 2 (two) times daily.    ENOXAPARIN (LOVENOX) 60 MG/0.6ML INJECTION    Inject 60 mg into the skin 2 (two) times daily.    GABAPENTIN (NEURONTIN) 100  MG CAPSULE    Take 100 mg by mouth 2 (two) times daily.   HYDROCORTISONE CREAM 1 %    Apply 1 application topically 3 (three) times daily. Apply to abdomen for rash   LEVETIRACETAM (KEPPRA) 100 MG/ML SOLUTION    Place 5 mLs (500 mg total) into feeding tube 2 (two) times daily.   METOPROLOL (LOPRESSOR) 100 MG TABLET    Place 100 mg into feeding tube 2 (two) times daily.   METOPROLOL TARTRATE (LOPRESSOR) 25 MG TABLET    Place 25 mg into feeding tube 2 (two) times daily.   MULTIPLE VITAMINS-MINERALS (DECUBI-VITE) CAPS    Place 1 capsule into feeding tube daily.    NUTRITIONAL SUPPLEMENTS (FEEDING SUPPLEMENT, JEVITY 1.2 CAL,) LIQD    100 mls/hour, for 12 hours/day.  On at 8pm, off at 8am.  May substitute Jevity 1.5 for two cal until tow  cal becomes available   ONDANSETRON (ZOFRAN) 4 MG TABLET    Take 4 mg by mouth daily.   OXYCODONE (OXY IR/ROXICODONE) 5 MG IMMEDIATE RELEASE TABLET    Give 1 tablet via PEG-Tube every 8 hours and every 4 hours as needed for pain   OXYCODONE (ROXICODONE) 5 MG IMMEDIATE RELEASE TABLET    Place 1 tablet (5 mg total) into feeding tube every 4 (four) hours as needed for severe pain.   PANTOPRAZOLE SODIUM POWD    Give 1 packet (40 mg) via G-Tube one time a day for GERD   POLYETHYLENE GLYCOL (MIRALAX / GLYCOLAX) PACKET    Place 17 g into feeding tube daily.   TIZANIDINE (ZANAFLEX) 4 MG TABLET    Place 4 mg into feeding tube 4 (four) times daily. Hold if Sleepy   UNABLE TO FIND    HSG Puree diet - HSG Puree texture, Regular consistency, may have pleasure finger foods with supervision   UNABLE TO FIND    Enternal Feet Order - Give 200 ml if water via G-Tube every 4 hours   WATER FOR INJECTION STERILE IJ    Place 30-60 mLs into feeding tube. Flush with 30-60 ml water before and after meds, before initiating feedings or when there is an interruption of feeding to maintain patency.  Also flush every shift with 5-10 ml water between each medication  Modified Medications   No  medications on file  Discontinued Medications   No medications on file    Review of Systems  Unable to perform ROS: Other (expressive aphasia)    Vitals:   11/26/16 1210  BP: 136/65  Pulse: 70  Resp: 17  Temp: 98.4 F (36.9 C)  SpO2: 97%  Weight: 122 lb (55.3 kg)  Height: 5' (1.524 m)   Body mass index is 23.83 kg/m.  Physical Exam  Constitutional: She appears well-developed and well-nourished.  Frail appearing sitting up in bed in NAD  HENT:  Mouth/Throat: Oropharynx is clear and moist. No oropharyngeal exudate.  MMM; no oral thrush  Eyes: Pupils are equal, round, and reactive to light. No scleral icterus.  Neck: Neck supple. Carotid bruit is present (b/l systolic). No tracheal deviation present.  Cardiovascular: Normal rate, regular rhythm, normal heart sounds and intact distal pulses.  Exam reveals no gallop and no friction rub.   No murmur heard. No LE edema b/l. no calf TTP.   Pulmonary/Chest: Effort normal and breath sounds normal. No stridor. No respiratory distress. She has no wheezes. She has no rales.  Abdominal: Soft. Normal appearance and bowel sounds are normal. She exhibits no distension and no mass. There is no hepatomegaly. There is no tenderness. There is no rigidity, no rebound and no guarding. No hernia.  Musculoskeletal: She exhibits deformity (LUE splint intact).  Lymphadenopathy:    She has no cervical adenopathy.  Neurological: She is alert.  Right hemiparesis; expressive aphasia  Skin: Skin is warm and dry. No rash noted.  Psychiatric: She has a normal mood and affect. Her behavior is normal.     Labs reviewed: Admission on 10/24/2016, Discharged on 10/24/2016  Component Date Value Ref Range Status  . Glucose-Capillary 10/24/2016 105* 65 - 99 mg/dL Final  Abstract on 16/10/9602  Component Date Value Ref Range Status  . Glucose 10/22/2016 131   Final  . BUN 10/22/2016 29* 4 - 21 Final  . Creatinine 10/22/2016 0.6  0.5 - 1.1 Final  .  Potassium 10/22/2016 4.7  3.4 - 5.3 Final  .  Sodium 10/22/2016 141  137 - 147 Final  . Alkaline Phosphatase 10/22/2016 128* 25 - 125 Final  . ALT 10/22/2016 11  7 - 35 Final  . AST 10/22/2016 11* 13 - 35 Final  . Bilirubin, Total 10/22/2016 0.2   Final   <  Abstract on 10/12/2016  Component Date Value Ref Range Status  . Glucose 10/11/2016 133   Final  . BUN 10/11/2016 26* 4 - 21 Final  . Creatinine 10/11/2016 0.6  0.5 - 1.1 Final  . Potassium 10/11/2016 4.8  3.4 - 5.3 Final  . Sodium 10/11/2016 140  137 - 147 Final  . Alkaline Phosphatase 10/11/2016 111  25 - 125 Final  . ALT 10/11/2016 10  7 - 35 Final  . AST 10/11/2016 9* 13 - 35 Final  . Bilirubin, Total 10/11/2016 0.2   Final   <  Abstract on 09/24/2016  Component Date Value Ref Range Status  . Microalb, Ur 09/24/2016 <1.2   Final  Abstract on 09/17/2016  Component Date Value Ref Range Status  . Hemoglobin A1C 09/15/2016 5.4   Final    No results found.   Assessment/Plan   ICD-10-CM   1. Bilateral low back pain, unspecified chronicity, with sciatica presence unspecified M54.5   2. Urine retention R33.9   3. 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 Chronic deep vein thrombosis (DVT) of proximal vein of left lower extremity (HCC) I82.5Y2   5. History of stroke Z86.73   6. Hemiparesis affecting right side as late effect of stroke (HCC) I69.351   7. Tenosynovitis of finger and hand M65.9     Check UA c and s to r/o UTI  Cont other meds as ordered  PT/OT as indicated  F/u with specialists as scheduled  Will follow  Mateya Torti S. Ancil Linseyarter, D. O., F. A. C. O. I.  [facility] [address] [place], [place] [address] [phone] Cell (Monday-Friday 8 AM - 5 PM) [phone] After 5 PM and follow prompts

## 2016-11-29 ENCOUNTER — Encounter: Payer: Self-pay | Admitting: Adult Health

## 2016-11-29 NOTE — Progress Notes (Signed)
Location:   [facility] Room Number: 204 A Place of Service:  SNF (31)   CODE STATUS: Full Code  No Known Allergies  Chief Complaint  Patient presents with  . Acute Visit    Mouth Pain    HPI:    Past Medical History:  Diagnosis Date  . Acute pulmonary embolism (HCC) 02/19/2015  . Acute respiratory failure (HCC)   . Diabetes mellitus without complication (HCC)    Type 2, W/o complications  . DVT (deep venous thrombosis) (HCC) 04/05/2015  . Dysphagia   . Epilepsy (HCC)   . GERD (gastroesophageal reflux disease)   . Hyperlipidemia   . Hypertension   . IBS (irritable bowel syndrome)   . Nontraumatic subarachnoid hemorrhage (HCC)   . SAH (subarachnoid hemorrhage) (HCC)   . Urinary retention     Past Surgical History:  Procedure Laterality Date  . ABDOMINAL SURGERY    . ANEURYSM COILING    . COLONOSCOPY N/A 02/20/2015   Procedure: COLONOSCOPY;  Surgeon: Iva Boop, MD;  Location: [place] ENDOSCOPY;  Service: Endoscopy;  Laterality: N/A;  . ESOPHAGOGASTRODUODENOSCOPY (EGD) WITH PROPOFOL N/A 02/02/2015   Procedure: ESOPHAGOGASTRODUODENOSCOPY (EGD) WITH PROPOFOL;  Surgeon: Jimmye Norman, MD;  Location: [facility] ENDOSCOPY;  Service: General;  Laterality: N/A;  . IR GASTRIC TUBE PERC CHG W/O IMG GUIDE  06/08/2016  . IR GASTRIC TUBE PERC CHG W/O IMG GUIDE  10/24/2016  . IR GENERIC HISTORICAL  08/26/2015   IR GASTRIC TUBE PERC CHG W/O IMG GUIDE 08/26/2015 Irish Lack, MD [facility]  . IR GENERIC HISTORICAL  09/14/2015   IR REPLC GASTRO/COLONIC TUBE PERCUT W/FLUORO 09/14/2015 Darrell K Allred, PA-C [facility]  . PEG PLACEMENT N/A 02/02/2015   Procedure: PERCUTANEOUS ENDOSCOPIC GASTROSTOMY (PEG) PLACEMENT;  Surgeon: Jimmye Norman, MD;  Location: [facility] ENDOSCOPY;  Service: General;  Laterality: N/A;  . RADIOLOGY WITH ANESTHESIA N/A 01/13/2015   Procedure: RADIOLOGY WITH ANESTHESIA;  Surgeon: Lisbeth Renshaw, MD;  Location: [facility] OR;  Service: Radiology;  Laterality: N/A;  .  TRACHEOSTOMY      Social History   Social History  . Marital status: Single    Spouse name: N/A  . Number of children: N/A  . Years of education: N/A   Occupational History  . Not on file.   Social History Main Topics  . Smoking status: Former Games developer  . Smokeless tobacco: Never Used  . Alcohol use No  . Drug use: No  . Sexual activity: Not on file   Other Topics Concern  . Not on file   Social History Narrative  . No narrative on file   History reviewed. No pertinent family history.    VITAL SIGNS BP 134/68   Pulse 72   Temp 97.9 F (36.6 C)   Resp 18   Ht 5' (1.524 m)   Wt 122 lb (55.3 kg)   LMP  (LMP Unknown)   SpO2 98%   BMI 23.83 kg/m   Patient's Medications  New Prescriptions   No medications on file  Previous Medications   ACETAMINOPHEN (TYLENOL) 325 MG TABLET    Place 650 mg into feeding tube daily. And every 4 hours as needed for mild pain,fever   AMINO ACIDS-PROTEIN HYDROLYS (FEEDING SUPPLEMENT, PRO-STAT SUGAR FREE 64,) LIQD    Place 60 mLs into feeding tube 3 (three) times daily with meals.    AMLODIPINE (NORVASC) 5 MG TABLET    Place 5 mg into feeding tube daily.    BETHANECHOL (URECHOLINE) 10 MG TABLET  10 mg by PEG Tube route every 8 (eight) hours.    CLONIDINE (CATAPRES) 0.1 MG TABLET    Place 0.1 mg into feeding tube 2 (two) times daily.    ENOXAPARIN (LOVENOX) 60 MG/0.6ML INJECTION    Inject 60 mg into the skin 2 (two) times daily.    GABAPENTIN (NEURONTIN) 100 MG CAPSULE    Take 100 mg by mouth 2 (two) times daily.   HYDROCORTISONE CREAM 1 %    Apply 1 application topically 3 (three) times daily. Apply to abdomen for rash   LEVETIRACETAM (KEPPRA) 100 MG/ML SOLUTION    Place 5 mLs (500 mg total) into feeding tube 2 (two) times daily.   METOPROLOL (LOPRESSOR) 100 MG TABLET    Place 100 mg into feeding tube 2 (two) times daily.   METOPROLOL TARTRATE (LOPRESSOR) 25 MG TABLET    Place 25 mg into feeding tube 2 (two) times daily.   MULTIPLE  VITAMINS-MINERALS (DECUBI-VITE) CAPS    Place 1 capsule into feeding tube daily.    NUTRITIONAL SUPPLEMENTS (FEEDING SUPPLEMENT, JEVITY 1.2 CAL,) LIQD    100 mls/hour, for 12 hours/day.  On at 8pm, off at 8am.  May substitute Jevity 1.5 for two cal until tow cal becomes available   ONDANSETRON (ZOFRAN) 4 MG TABLET    Take 4 mg by mouth daily.   OXYCODONE (OXY IR/ROXICODONE) 5 MG IMMEDIATE RELEASE TABLET    Give 1 tablet via PEG-Tube every 8 hours and every 4 hours as needed for pain   OXYCODONE (ROXICODONE) 5 MG IMMEDIATE RELEASE TABLET    Place 1 tablet (5 mg total) into feeding tube every 4 (four) hours as needed for severe pain.   PANTOPRAZOLE SODIUM POWD    Give 1 packet (40 mg) via G-Tube one time a day for GERD   POLYETHYLENE GLYCOL (MIRALAX / GLYCOLAX) PACKET    Place 17 g into feeding tube daily.   TIZANIDINE (ZANAFLEX) 4 MG TABLET    Place 4 mg into feeding tube 4 (four) times daily. Hold if Sleepy   UNABLE TO FIND    HSG Puree diet - HSG Puree texture, Regular consistency, may have pleasure finger foods with supervision   UNABLE TO FIND    Enternal Feet Order - Give 200 ml if water via G-Tube every 4 hours   WATER FOR INJECTION STERILE IJ    Place 30-60 mLs into feeding tube. Flush with 30-60 ml water before and after meds, before initiating feedings or when there is an interruption of feeding to maintain patency.  Also flush every shift with 5-10 ml water between each medication  Modified Medications   No medications on file  Discontinued Medications   No medications on file     SIGNIFICANT DIAGNOSTIC EXAMS  PREVIOUS  02-17-15: ct angio of chest: 1. Extensive pulmonary emboli involving distal main pulmonary arteries extending into lobar, segmental and subsegmental sized branches throughout the lungs bilaterally. At this time, there are no overt findings to suggest right heart strain. 2. Cardiomegaly with left ventricular concentric hypertrophy. 3. Dependent atelectasis throughout  the lower lobes of the lungs bilaterally.  02-18-15: bilateral lower extremity doppler: Findings consistent with acute deep vein thrombosis involving the left common femoral vein, left proximal profunda femoris vein, and left proximal femoral vein. Incidental findings are consistent with: enlarged lymph node on the right. - No evidence of deep vein thrombosis involving the right lower extremity. - No evidence of Baker&'s cyst on the right or left.  02-19-15: pelvic  ultrasound: 1. Uterus not identified, presumed hysterectomy. No mass or free fluid seen within the midline pelvis. 2. Neither ovary is seen, perhaps bilateral oophorectomies, perhaps obscured by the fairly prominent fluid-filled bowel loops in the pelvis. No mass or free fluid seen within either adnexal region.  06-14-16: diagnostic right mammogram: benign cyst  08-02-16: left upper extremity doppler: negative for dvt  08-02-16: left hand x-ray: mild osteoarthritis   08-15-16: left hand x-ray: mild osteoarthritis no change   09-04-16: chest x-ray:  cardio retrocardiac opacities  NO NEW EXAMS     LABS REVIEWED: PREVIOUS    11-02-15: wbc 8.2; hgb 11.8; hct 37.3; mcv 89.3; plt 220; glucose 109; bun 16.6; creat 0.88; k+ 5.2; na++ 136 11-23-15: wbc 6.3; hgb 13.8; hct 43.9; mcv 96.8; plt 225; glucose 92; bun 27.6; creat 0.86; k+ 4.3; na++ 148  04-26-16: wbc 6.9; hgb 12.;6 hct 38.0; mcv 94.4; plt 231; glucose 94; bun 10.0; creat 0.65; k+ 4.2; na++ 142; liver normal albumin 4.2; chol 177; lld 124; trig 101; hdl 33  06-14-16: wbc 8.4; hgb 11.9; hct 36.6; mcv 95.4; plt 203; glucose 134; bun 30.7; creat 0.62; k+ 4.5 ;na++ 149; liver normal albumin 4.2; vit B 12: 521; folate 19.1; chol 155; ldl 85; trig 181; hdl 34; hgb a1c 5.7   NO NEW LABS  Review of Systems  Unable to perform ROS: Other (has aphasia)    Physical Exam  Constitutional: No distress.  Eyes: Conjunctivae are normal.  Neck: Neck supple. No JVD present. No thyromegaly present.    Cardiovascular: Normal rate, regular rhythm and intact distal pulses.   Respiratory: Effort normal and breath sounds normal. No respiratory distress. She has no wheezes.  GI: Soft. Bowel sounds are normal. She exhibits no distension. There is no tenderness.  Peg tube present without signs of infection present   Musculoskeletal: She exhibits no edema.  Right hemiparesis Right foot pain is tender to palpation and range of motion is guarded   Lymphadenopathy:    She has no cervical adenopathy.  Neurological: She is alert.  Skin: Skin is warm and dry. She is not diaphoretic.  Psychiatric: She has a normal mood and affect.     ASSESSMENT/ PLAN:  1. Diabetes: is stable  hgb a1c is 5.7 (previous 5.5) ; is currently off medications; will continue to monitor her status.  ldl is 85   2. Drop foot: will have therapy evaluate and treat as indicated for supportive devices.  Diabetic foot exam performed.     MD is aware of resident's narcotic use and is in agreement with current plan of care. We will attempt to wean resident as apropriate     Synthia Innocent NP [facility]  Contact [phone] Monday through Friday 8am- 5pm  After hours call [phone]

## 2016-12-03 ENCOUNTER — Other Ambulatory Visit: Payer: Self-pay

## 2016-12-03 MED ORDER — OXYCODONE HCL 5 MG PO TABS: ORAL_TABLET | ORAL | 0 refills | Status: DC

## 2016-12-03 NOTE — Telephone Encounter (Signed)
RX faxed to [facility] @ [phone], phone number [phone] 

## 2016-12-09 ENCOUNTER — Encounter: Payer: Self-pay | Admitting: Internal Medicine

## 2016-12-11 ENCOUNTER — Other Ambulatory Visit: Payer: Self-pay

## 2016-12-11 MED ORDER — OXYCODONE HCL 5 MG PO TABS: ORAL_TABLET | ORAL | 0 refills | Status: DC

## 2016-12-11 NOTE — Telephone Encounter (Signed)
RX faxed to [facility] @ [phone], phone number [phone] 

## 2016-12-20 NOTE — Progress Notes (Signed)
This encounter was created in error - please disregard.

## 2016-12-26 ENCOUNTER — Encounter: Payer: Self-pay | Admitting: Adult Health

## 2016-12-26 ENCOUNTER — Non-Acute Institutional Stay (SKILLED_NURSING_FACILITY): Payer: Medicaid Other | Admitting: Adult Health

## 2016-12-26 DIAGNOSIS — E1149 Type 2 diabetes mellitus with other diabetic neurological complication: Secondary | ICD-10-CM | POA: Diagnosis not present

## 2016-12-26 DIAGNOSIS — I825Y2 Chronic embolism and thrombosis of unspecified deep veins of left proximal lower extremity: Secondary | ICD-10-CM

## 2016-12-26 DIAGNOSIS — I2782 Chronic pulmonary embolism: Secondary | ICD-10-CM

## 2016-12-26 DIAGNOSIS — R569 Unspecified convulsions: Secondary | ICD-10-CM | POA: Diagnosis not present

## 2016-12-26 NOTE — Progress Notes (Signed)
Location:   [facility] Room Number: 204 A Place of Service:  SNF (31)   CODE STATUS: Full Code  No Known Allergies  Chief Complaint  Patient presents with  . Medical Management of Chronic Issues    Diabetes; seizures; dvt/pe    HPI:  She is a 51 year old long term resident of this facility being seen for the management of her chronic illnesses: dvt/pe; seizures; diabetes type 2 with neurological manifestations.  She is unable to fully participate in the hpi or ros. There are no reports of pain; nausea vomiting; and no reports of behavioral issues. There are no nursing concerns at this time.   Past Medical History:  Diagnosis Date  . Acute pulmonary embolism (HCC) 02/19/2015  . Acute respiratory failure (HCC)   . Diabetes mellitus without complication (HCC)    Type 2, W/o complications  . DVT (deep venous thrombosis) (HCC) 04/05/2015  . Dysphagia   . Epilepsy (HCC)   . GERD (gastroesophageal reflux disease)   . Hyperlipidemia   . Hypertension   . IBS (irritable bowel syndrome)   . Nontraumatic subarachnoid hemorrhage (HCC)   . SAH (subarachnoid hemorrhage) (HCC)   . Urinary retention     Past Surgical History:  Procedure Laterality Date  . ABDOMINAL SURGERY    . ANEURYSM COILING    . COLONOSCOPY N/A 02/20/2015   Procedure: COLONOSCOPY;  Surgeon: Iva Booparl E Gessner, MD;  [facility];  Service: Endoscopy;  Laterality: N/A;  . ESOPHAGOGASTRODUODENOSCOPY (EGD) WITH PROPOFOL N/A 02/02/2015   Procedure: ESOPHAGOGASTRODUODENOSCOPY (EGD) WITH PROPOFOL;  Surgeon: Jimmye NormanJames Wyatt, MD;  [facility];  Service: General;  Laterality: N/A;  . IR GASTRIC TUBE PERC CHG W/O IMG GUIDE  06/08/2016  . IR GASTRIC TUBE PERC CHG W/O IMG GUIDE  10/24/2016  . IR GENERIC HISTORICAL  08/26/2015   IR GASTRIC TUBE PERC CHG W/O IMG GUIDE 08/26/2015 Irish LackGlenn Yamagata, MD [facility]-INTERV RAD  . IR GENERIC HISTORICAL  09/14/2015   IR REPLC GASTRO/COLONIC TUBE PERCUT W/FLUORO 09/14/2015 Darrell  K Allred, PA-C [facility]-INTERV RAD  . PEG PLACEMENT N/A 02/02/2015   Procedure: PERCUTANEOUS ENDOSCOPIC GASTROSTOMY (PEG) PLACEMENT;  Surgeon: Jimmye NormanJames Wyatt, MD;  [facility];  Service: General;  Laterality: N/A;  . RADIOLOGY WITH ANESTHESIA N/A 01/13/2015   Procedure: RADIOLOGY WITH ANESTHESIA;  Surgeon: Lisbeth RenshawNeelesh Nundkumar, MD;  Location: [facility] OR;  Service: Radiology;  Laterality: N/A;  . TRACHEOSTOMY      Social History   Socioeconomic History  . Marital status: Single    Spouse name: Not on file  . Number of children: Not on file  . Years of education: Not on file  . Highest education level: Not on file  Social Needs  . Financial resource strain: Not on file  . Food insecurity - worry: Not on file  . Food insecurity - inability: Not on file  . Transportation needs - medical: Not on file  . Transportation needs - non-medical: Not on file  Occupational History  . Not on file  Tobacco Use  . Smoking status: Former Games developermoker  . Smokeless tobacco: Never Used  Substance and Sexual Activity  . Alcohol use: No  . Drug use: No  . Sexual activity: Not on file  Other Topics Concern  . Not on file  Social History Narrative  . Not on file   History reviewed. No pertinent family history.    VITAL SIGNS BP 128/80   Pulse 68   Temp 98.2 F (36.8 C)  Resp 16   Ht 5' (1.524 m)   Wt 127 lb 6.4 oz (57.8 kg)   LMP  (LMP Unknown)   SpO2 98%   BMI 24.88 kg/m   Outpatient Encounter Medications as of 12/26/2016  Medication Sig  . acetaminophen (TYLENOL) 325 MG tablet Place 650 mg into feeding tube daily. And every 4 hours as needed for mild pain,fever  . Amino Acids-Protein Hydrolys (FEEDING SUPPLEMENT, PRO-STAT SUGAR FREE 64,) LIQD Place 60 mLs into feeding tube 3 (three) times daily with meals.   Marland Kitchen. amLODipine (NORVASC) 5 MG tablet Place 5 mg into feeding tube daily.   . bethanechol (URECHOLINE) 10 MG tablet 10 mg by PEG Tube route every 8 (eight) hours.   . cloNIDine (CATAPRES) 0.1  MG tablet Place 0.1 mg into feeding tube 2 (two) times daily.   Marland Kitchen. enoxaparin (LOVENOX) 60 MG/0.6ML injection Inject 60 mg into the skin 2 (two) times daily.   Marland Kitchen. gabapentin (NEURONTIN) 100 MG capsule Take 300 mg by mouth 2 (two) times daily. Hold if Lathargic  . hydrocortisone cream 1 % Apply 1 application topically 3 (three) times daily. Apply to abdomen for rash  . levETIRAcetam (KEPPRA) 100 MG/ML solution Place 5 mLs (500 mg total) into feeding tube 2 (two) times daily.  . metoprolol (LOPRESSOR) 100 MG tablet Place 100 mg into feeding tube 2 (two) times daily.  . metoprolol tartrate (LOPRESSOR) 25 MG tablet Place 25 mg into feeding tube 2 (two) times daily.  . Multiple Vitamins-Minerals (DECUBI-VITE) CAPS Place 1 capsule into feeding tube daily.   . Nutritional Supplements (FEEDING SUPPLEMENT, JEVITY 1.2 CAL,) LIQD 100 mls/hour, for 12 hours/day.  On at 8pm, off at 8am.  May substitute Jevity 1.5 for two cal until tow cal becomes available  . ondansetron (ZOFRAN) 4 MG tablet Take 4 mg by mouth daily.  Marland Kitchen. oxyCODONE (OXY IR/ROXICODONE) 5 MG immediate release tablet Give 1 tablet via PEG-Tube every 8 hours and every 4 hours as needed for pain  . oxyCODONE (ROXICODONE) 5 MG immediate release tablet Place 1 tablet (5 mg total) into feeding tube every 4 (four) hours as needed for severe pain.  . Pantoprazole Sodium POWD Give 1 packet (40 mg) via G-Tube one time a day for GERD  . polyethylene glycol (MIRALAX / GLYCOLAX) packet Place 17 g into feeding tube daily.  . tizanidine (ZANAFLEX) 6 MG capsule Place 4 mg into feeding tube 3 (three) times daily. Hold if Sleepy   . UNABLE TO FIND HSG Puree diet - HSG Puree texture, Regular consistency, may have pleasure finger foods with supervision  . UNABLE TO FIND Enternal Feet Order - Give 200 ml if water via G-Tube every 4 hours  . WATER FOR INJECTION STERILE IJ Place 30-60 mLs into feeding tube. Flush with 30-60 ml water before and after meds, before initiating  feedings or when there is an interruption of feeding to maintain patency.  Also flush every shift with 5-10 ml water between each medication   No facility-administered encounter medications on file as of 12/26/2016.      SIGNIFICANT DIAGNOSTIC EXAMS  PREVIOUS  02-17-15: ct angio of chest: 1. Extensive pulmonary emboli involving distal main pulmonary arteries extending into lobar, segmental and subsegmental sized branches throughout the lungs bilaterally. At this time, there are no overt findings to suggest right heart strain. 2. Cardiomegaly with left ventricular concentric hypertrophy. 3. Dependent atelectasis throughout the lower lobes of the lungs bilaterally.  02-18-15: bilateral lower extremity doppler: Findings consistent with  acute deep vein thrombosis involving the left common femoral vein, left proximal profunda femoris vein, and left proximal femoral vein. Incidental findings are consistent with: enlarged lymph node on the right. - No evidence of deep vein thrombosis involving the right lower extremity. - No evidence of Baker&'s cyst on the right or left.  02-19-15: pelvic ultrasound: 1. Uterus not identified, presumed hysterectomy. No mass or free fluid seen within the midline pelvis. 2. Neither ovary is seen, perhaps bilateral oophorectomies, perhaps obscured by the fairly prominent fluid-filled bowel loops in the pelvis. No mass or free fluid seen within either adnexal region.  06-14-16: diagnostic right mammogram: benign cyst  08-02-16: left upper extremity doppler: negative for dvt  08-02-16: left hand x-ray: mild osteoarthritis   08-15-16: left hand x-ray: mild osteoarthritis no change   09-04-16: chest x-ray:  cardio retrocardiac opacities  NO NEW EXAMS     LABS REVIEWED: PREVIOUS    11-02-15: wbc 8.2; hgb 11.8; hct 37.3; mcv 89.3; plt 220; glucose 109; bun 16.6; creat 0.88; k+ 5.2; na++ 136 11-23-15: wbc 6.3; hgb 13.8; hct 43.9; mcv 96.8; plt 225; glucose 92; bun 27.6;  creat 0.86; k+ 4.3; na++ 148  04-26-16: wbc 6.9; hgb 12.;6 hct 38.0; mcv 94.4; plt 231; glucose 94; bun 10.0; creat 0.65; k+ 4.2; na++ 142; liver normal albumin 4.2; chol 177; lld 124; trig 101; hdl 33  06-14-16: wbc 8.4; hgb 11.9; hct 36.6; mcv 95.4; plt 203; glucose 134; bun 30.7; creat 0.62; k+ 4.5 ;na++ 149; liver normal albumin 4.2; vit B 12: 521; folate 19.1; chol 155; ldl 85; trig 181; hdl 34; hgb a1c 5.7   NO NEW LABS  Review of Systems  Unable to perform ROS: Other (aphasia )    Physical Exam  Constitutional: She appears well-developed and well-nourished. No distress.  Thin   Neck: Neck supple. No thyromegaly present.  Cardiovascular: Normal rate, regular rhythm, normal heart sounds and intact distal pulses.  Pulmonary/Chest: Effort normal and breath sounds normal.  Abdominal: Soft. Bowel sounds are normal. She exhibits no distension. There is no tenderness.  Peg tube without signs of infection   Musculoskeletal: She exhibits no edema.  Right hemiparesis  Lymphadenopathy:    She has no cervical adenopathy.  Neurological: She is alert.  Skin: Skin is warm and dry. She is not diaphoretic.  Psychiatric: She has a normal mood and affect.    ASSESSMENT/ PLAN:  TODAY  1. Diabetes: is stable  hgb a1c is 5.7 (previous 5.5) ; is currently off medications; will continue to monitor her status.  ldl is 85   2. Seizure: is stable  no reports of seizure activity present: will continue keppra 500 mg twice daily and will monitor   3. DVT/PE: is stable  she does require long term anticoagulation therapy; will continue lovenox 50  mg twice daily (weigt based); is not a candidate for xarelto; or eliquis due to her history of GI bleed; she is not appropriate for coumadin therapy; unable  to obtain adequate INR.  PREVIOUS  4. Hypertension: stable b/p128/80   will continue lopressor 125 mg twice daily and clonidine 0.1 mg twice daily norvasc 5 mg daily   5.  Dysphagia: stable  no signs of  aspiration present  her po intake is not adequate to maintain her body weight; she is dependent upon peg tube feeding.  6. SAH: is neurologically without change; has right hemiparesis; will monitor her status.   7. UTI/urine retention: stable is currently off all  medications; will not make changes will not make changes.   8.  Weight loss:  Her weight is 127  pounds. Has improved; is now allowing for peg tube feedings.  will continue her current plan of care and will continue to monitor her status.      MD is aware of resident's narcotic use and is in agreement with current plan of care. We will attempt to wean resident as apropriate    Synthia Innocent NP [facility]  Contact [phone] Monday through Friday 8am- 5pm  After hours call [phone]

## 2017-01-06 ENCOUNTER — Emergency Department (HOSPITAL_COMMUNITY)
Admission: EM | Admit: 2017-01-06 | Discharge: 2017-01-07 | Disposition: A | Discharge status: Home / Self Care | Payer: Medicaid Other | Attending: Emergency Medicine | Admitting: Emergency Medicine

## 2017-01-06 ENCOUNTER — Emergency Department (HOSPITAL_COMMUNITY): Payer: Medicaid Other

## 2017-01-06 ENCOUNTER — Encounter (HOSPITAL_COMMUNITY): Payer: Self-pay | Admitting: Nurse Practitioner

## 2017-01-06 ENCOUNTER — Other Ambulatory Visit: Payer: Self-pay

## 2017-01-06 DIAGNOSIS — E119 Type 2 diabetes mellitus without complications: Secondary | ICD-10-CM | POA: Insufficient documentation

## 2017-01-06 DIAGNOSIS — I10 Essential (primary) hypertension: Secondary | ICD-10-CM | POA: Insufficient documentation

## 2017-01-06 DIAGNOSIS — R1310 Dysphagia, unspecified: Secondary | ICD-10-CM | POA: Insufficient documentation

## 2017-01-06 DIAGNOSIS — Z87891 Personal history of nicotine dependence: Secondary | ICD-10-CM | POA: Diagnosis not present

## 2017-01-06 DIAGNOSIS — Z431 Encounter for attention to gastrostomy: Secondary | ICD-10-CM | POA: Diagnosis present

## 2017-01-06 DIAGNOSIS — Z4659 Encounter for fitting and adjustment of other gastrointestinal appliance and device: Secondary | ICD-10-CM

## 2017-01-06 DIAGNOSIS — K9420 Gastrostomy complication, unspecified: Secondary | ICD-10-CM

## 2017-01-06 DIAGNOSIS — Z7901 Long term (current) use of anticoagulants: Secondary | ICD-10-CM | POA: Insufficient documentation

## 2017-01-06 DIAGNOSIS — Z79899 Other long term (current) drug therapy: Secondary | ICD-10-CM | POA: Diagnosis not present

## 2017-01-06 LAB — CBC WITH DIFFERENTIAL/PLATELET
BASOS PCT: 0 %
Basophils Absolute: 0 10*3/uL (ref 0.0–0.1)
EOS ABS: 0.1 10*3/uL (ref 0.0–0.7)
EOS PCT: 1 %
HEMATOCRIT: 45 % (ref 36.0–46.0)
Hemoglobin: 15 g/dL (ref 12.0–15.0)
Lymphocytes Relative: 24 %
Lymphs Abs: 2 10*3/uL (ref 0.7–4.0)
MCH: 31.1 pg (ref 26.0–34.0)
MCHC: 33.3 g/dL (ref 30.0–36.0)
MCV: 93.2 fL (ref 78.0–100.0)
MONO ABS: 0.6 10*3/uL (ref 0.1–1.0)
MONOS PCT: 7 %
Neutro Abs: 5.5 10*3/uL (ref 1.7–7.7)
Neutrophils Relative %: 68 %
PLATELETS: 199 10*3/uL (ref 150–400)
RBC: 4.83 MIL/uL (ref 3.87–5.11)
RDW: 13.9 % (ref 11.5–15.5)
WBC: 8.1 10*3/uL (ref 4.0–10.5)

## 2017-01-06 LAB — BASIC METABOLIC PANEL
Anion gap: 8 (ref 5–15)
BUN: 20 mg/dL (ref 6–20)
CALCIUM: 10.7 mg/dL — AB (ref 8.9–10.3)
CO2: 31 mmol/L (ref 22–32)
CREATININE: 0.74 mg/dL (ref 0.44–1.00)
Chloride: 103 mmol/L (ref 101–111)
Glucose, Bld: 99 mg/dL (ref 65–99)
Potassium: 4 mmol/L (ref 3.5–5.1)
SODIUM: 142 mmol/L (ref 135–145)

## 2017-01-06 MED ORDER — SODIUM CHLORIDE 0.9 % IV SOLN
INTRAVENOUS | Status: DC
  Administered 2017-01-06: 22:00:00 via INTRAVENOUS

## 2017-01-06 MED ORDER — SODIUM CHLORIDE 0.9 % IV BOLUS (SEPSIS)
500.0000 mL | Freq: Once | INTRAVENOUS | Status: AC
  Administered 2017-01-06: 500 mL via INTRAVENOUS

## 2017-01-06 NOTE — ED Provider Notes (Addendum)
[facility] DEPT Provider Note   CSN: [id number] Arrival date & time: 01/06/17  1946     History   Chief Complaint Chief Complaint  Patient presents with  . Tube Dislodgement    HPI Rachel Vang is a 51 y.o. female.  Patient sent in from [place] reportedly G-tube fell out sometime this morning.  It appears that they had tried to push it back and part way but it is not in through the track all the way.  Patient has a history of dysphagia which which apparently prohibits her from taking anything orally.      Past Medical History:  Diagnosis Date  . Acute pulmonary embolism ([place]) 02/19/2015  . Acute respiratory failure ([place])   . Diabetes mellitus without complication (HCC)    Type 2, W/o complications  . DVT (deep venous thrombosis) ([place]) 04/05/2015  . Dysphagia   . Epilepsy ([place])   . GERD (gastroesophageal reflux disease)   . Hyperlipidemia   . Hypertension   . IBS (irritable bowel syndrome)   . Nontraumatic subarachnoid hemorrhage ([place])   . SAH (subarachnoid hemorrhage) ([place])   . Urinary retention     Patient Active Problem List   Diagnosis Date Noted  . Foot drop, right foot 10/18/2016  . Cough, persistent 09/17/2016  . CVA (cerebrovascular accident) ([place]) 08/30/2016  . GERD without esophagitis 08/30/2016  . History of stroke 08/16/2016  . Localized swelling on left hand 08/16/2016  . Pain and swelling of left wrist 08/16/2016  . Chronic pulmonary embolism ([place]) 07/03/2016  . Weight loss, non-intentional 06/13/2016  . Hemiparesis affecting right side as late effect of stroke ([place]) 09/25/2015  . Type II diabetes mellitus with neurological manifestations ([place]) 05/20/2015  . Essential hypertension, benign 05/20/2015  . S/P percutaneous endoscopic gastrostomy (PEG) tube placement ([place]) 05/20/2015  . DVT (deep venous thrombosis) ([place]) 04/05/2015  . Protein-calorie malnutrition, severe ([place]) 03/25/2015  .  Dysphagia 02/18/2015  . Seizures ([place]) 02/18/2015  . Urine retention 02/18/2015  . History of ETT   . Subarachnoid hemorrhage [facility])     Past Surgical History:  Procedure Laterality Date  . ABDOMINAL SURGERY    . ANEURYSM COILING    . COLONOSCOPY N/A 02/20/2015   Procedure: COLONOSCOPY;  Surgeon: Gatha Mayer, MD;  Location: [place];  Service: Endoscopy;  Laterality: N/A;  . ESOPHAGOGASTRODUODENOSCOPY (EGD) WITH PROPOFOL N/A 02/02/2015   Procedure: ESOPHAGOGASTRODUODENOSCOPY (EGD) WITH PROPOFOL;  Surgeon: Judeth Horn, MD;  Location: [place];  Service: General;  Laterality: N/A;  . IR GASTRIC TUBE PERC CHG W/O IMG GUIDE  06/08/2016  . IR GASTRIC TUBE PERC CHG W/O IMG GUIDE  10/24/2016  . IR GENERIC HISTORICAL  08/26/2015   IR GASTRIC TUBE PERC CHG W/O IMG GUIDE 08/26/2015 Aletta Edouard, MD [facility]  . IR GENERIC HISTORICAL  09/14/2015   IR REPLC GASTRO/COLONIC TUBE PERCUT W/FLUORO 09/14/2015 Darrell K Allred, PA-C [facility]  . PEG PLACEMENT N/A 02/02/2015   Procedure: PERCUTANEOUS ENDOSCOPIC GASTROSTOMY (PEG) PLACEMENT;  Surgeon: Judeth Horn, MD;  Location: [place];  Service: General;  Laterality: N/A;  . RADIOLOGY WITH ANESTHESIA N/A 01/13/2015   Procedure: RADIOLOGY WITH ANESTHESIA;  Surgeon: Consuella Lose, MD;  Location: [place];  Service: Radiology;  Laterality: N/A;  . TRACHEOSTOMY      OB History    No data available       Home Medications    Prior to Admission medications   Medication Sig Start Date End Date  Taking? Authorizing Provider  acetaminophen (TYLENOL) 325 MG tablet Place 650 mg into feeding tube daily. And every 4 hours as needed for mild pain,fever   Yes [provider]  Amino Acids-Protein Hydrolys (FEEDING SUPPLEMENT, PRO-STAT SUGAR FREE 64,) LIQD Place 60 mLs into feeding tube 3 (three) times daily with meals.    Yes [provider]  amLODipine (NORVASC) 5 MG tablet Place 5 mg into feeding tube daily.    Yes [provider]  bethanechol (URECHOLINE) 10 MG tablet 10 mg by PEG Tube route every 8 (eight) hours.    Yes [provider]  cloNIDine (CATAPRES) 0.1 MG tablet Place 0.1 mg into feeding tube 2 (two) times daily.    Yes [provider]  enoxaparin (LOVENOX) 60 MG/0.6ML injection Inject 60 mg into the skin 2 (two) times daily.    Yes [provider]  gabapentin (NEURONTIN) 100 MG capsule Take 300 mg by mouth 2 (two) times daily. Hold if Lathargic 11/30/16  Yes [provider]  hydrocortisone cream 1 % Apply 1 application topically 3 (three) times daily. Apply to abdomen for rash   Yes [provider]  levETIRAcetam (KEPPRA) 100 MG/ML solution Place 5 mLs (500 mg total) into feeding tube 2 (two) times daily. 02/11/15  Yes Thurnell Lose, MD  metoprolol (LOPRESSOR) 100 MG tablet Place 100 mg into feeding tube 2 (two) times daily.   Yes [provider]  metoprolol tartrate (LOPRESSOR) 25 MG tablet Place 25 mg into feeding tube 2 (two) times daily.   Yes [provider]  Multiple Vitamins-Minerals (DECUBI-VITE) CAPS Place 1 capsule into feeding tube daily.    Yes [provider]  ondansetron (ZOFRAN) 4 MG tablet Take 4 mg by mouth daily.   Yes [provider]  oxyCODONE (OXY IR/ROXICODONE) 5 MG immediate release tablet Give 1 tablet via PEG-Tube every 8 hours and every 4 hours as needed for pain Patient taking differently: 3 (three) times daily. Give 1 tablet via PEG-Tube every 8 hours 12/11/16  Yes Gerlene Fee, NP  Pantoprazole Sodium POWD Give 1 packet (40 mg) via G-Tube one time a day for GERD   Yes [provider]  polyethylene glycol (MIRALAX / GLYCOLAX) packet Place 17 g into feeding tube daily.   Yes [provider]  tizanidine (ZANAFLEX) 6 MG capsule Place 4 mg into feeding tube 3 (three) times daily. Hold if Sleepy  12/15/16  Yes [provider]  Nutritional Supplements (FEEDING  SUPPLEMENT, JEVITY 1.2 CAL,) LIQD 100 mls/hour, for 12 hours/day.  On at 8pm, off at 8am.  May substitute Jevity 1.5 for two cal until tow cal becomes available    [provider]  oxyCODONE (ROXICODONE) 5 MG immediate release tablet Place 1 tablet (5 mg total) into feeding tube every 4 (four) hours as needed for severe pain. 10/29/16   Gerlene Fee, NP  UNABLE TO FIND HSG Puree diet - HSG Puree texture, Regular consistency, may have pleasure finger foods with supervision    [provider]  UNABLE TO FIND Enternal Feet Order - Give 200 ml if water via G-Tube every 4 hours    [provider]  WATER FOR INJECTION STERILE IJ Place 30-60 mLs into feeding tube. Flush with 30-60 ml water before and after meds, before initiating feedings or when there is an interruption of feeding to maintain patency.  Also flush every shift with 5-10 ml water between each medication    [provider]  Family History History reviewed. No pertinent family history.  Social History Social History   Tobacco Use  . Smoking status: Former Research scientist (life sciences)  . Smokeless tobacco: Never Used  Substance Use Topics  . Alcohol use: No  . Drug use: No     Allergies   Patient has no known allergies.   Review of Systems Review of Systems  Unable to perform ROS: Mental status change     Physical Exam Updated Vital Signs BP (!) 132/99 (BP Location: Left Arm)   Pulse 74   Temp 99.5 F (37.5 C) (Oral)   Ht 1.524 m (5')   Wt 58.1 kg (128 lb)   LMP  (LMP Unknown)   SpO2 97%   BMI 25.00 kg/m   Physical Exam  Constitutional: She appears well-developed and well-nourished.  HENT:  Mucous membranes slightly dry.  Eyes: Conjunctivae are normal. Pupils are equal, round, and reactive to light.  Neck: Neck supple.  Cardiovascular: Normal rate.  Pulmonary/Chest: Effort normal and breath sounds normal.  Abdominal: Soft. Bowel sounds are normal. She exhibits no distension. There is no  tenderness.  G-tube appears to be partially in place in the left upper quadrant area.  No evidence of any skin infection.  Musculoskeletal: Normal range of motion.  Neurological: She is alert.  Skin: Skin is warm.  Nursing note and vitals reviewed.    ED Treatments / Results  Labs (all labs ordered are listed, but only abnormal results are displayed) Labs Reviewed  CBC WITH DIFFERENTIAL/PLATELET  BASIC METABOLIC PANEL    EKG  EKG Interpretation None       Radiology No results found.  Procedures Procedures (including critical care time)  Medications Ordered in ED Medications  0.9 %  sodium chloride infusion (not administered)  sodium chloride 0.9 % bolus 500 mL (not administered)     Initial Impression / Assessment and Plan / ED Course  I have reviewed the triage vital signs and the nursing notes.  Pertinent labs & imaging results that were available during my care of the patient were reviewed by me and considered in my medical decision making (see chart for details).    Attempted irrigate the G-tube where it was placed.  But was suspicious that was partially out.  In the process of irrigating it is popped out completely.  Seems as if it had come out and they had pushed it partially back in.  Attempted to replace it with the same type of G-tube immediately but was not able to pass it.  Discussed with IR interventional radiology and order has been placed we will keep patient in the emergency department overnight and take her from here and not replace the G-tube in the morning.  Will attempt to place with a small Foley or red Robin type tube just keep track open for them.  In the meantime patient will receive IV hydration.  Basic labs ordered.   Final Clinical Impressions(s) / ED Diagnoses   Final diagnoses:  Encounter for care related to feeding tube    ED Discharge Orders    None       Fredia Sorrow, MD 01/06/17 2210   Addendum:  Was able to place a 48  [place] Foley it was daily type.  Met a little bit of resistance but nothing significant.  Went in fine.  To up the balloon.  Clearly seems to be in the right space.  We will not be used.  This will assist interventional radiology with permanent placement  of the G-tube.   Fredia Sorrow, MD 01/06/17 [phone]

## 2017-01-06 NOTE — ED Notes (Signed)
Bed: [id number] Expected date:  Expected time:  Means of arrival:  Comments: 7553 f tube came out

## 2017-01-06 NOTE — ED Triage Notes (Signed)
Pt BIB via [facility] with c/o G-tube dislodging at 7am this morning at [facility]. VS: 122/76, 68, NSR, 18, 100%RA. Hx of dysphagia. Per the nurse at the facility and [facility] the facility provider informed them to try their best to maintain the patient however the nursing staff was concerned that the patient was unable to be fed which concerned them. The patient explained to [facility] that the tube came out at 1am and staff said they would take care of it and she isn't sure why they waited all day.

## 2017-01-07 ENCOUNTER — Encounter (HOSPITAL_COMMUNITY): Payer: Self-pay | Admitting: Radiology

## 2017-01-07 ENCOUNTER — Emergency Department (HOSPITAL_COMMUNITY): Payer: Medicaid Other

## 2017-01-07 HISTORY — PX: IR REPLACE G-TUBE SIMPLE WO FLUORO: IMG2323

## 2017-01-07 NOTE — Procedures (Signed)
Patient with existing 6916 French Foley catheter in place of dislodged gastrostomy tube.  A new 16 French entuit balloon retention gastrostomy tube was inserted without immediate complications.  Tube flushes easily and was secured to the skin.  5 cc normal saline injected into the balloon.

## 2017-01-07 NOTE — ED Notes (Signed)
IR PRESENT

## 2017-01-07 NOTE — ED Notes (Signed)
[facility] at bedside 

## 2017-01-07 NOTE — ED Provider Notes (Signed)
Ir Gastric Tube Perc Chg W/o Img Guide  Result Date: 01/07/2017 INDICATION: Patient with dislodged gastrostomy tube; request received for replacement. EXAM: GASTROSTOMY TUBE REPLACEMENT MEDICATIONS: None ANESTHESIA/SEDATION: None CONTRAST:  None FLUOROSCOPY TIME:  None COMPLICATIONS: None immediate. PROCEDURE: Informed consent was obtained from the patient after a thorough discussion of the procedural risks, benefits and alternatives. All questions were addressed. A timeout was performed prior to the initiation of the procedure. The existing 16 French foley catheter was exchanged out for a new 16 [place] entuit balloon retention gastrostomy tube without immediate complications. 5 cc of normal saline were injected into balloon. The tube was secured to the skin site. The tube flushed easily. Okay to use. IMPRESSION: Successful replacement of 16 french balloon retention gastrostomy tube. Read by: Jeananne RamaKevin Allred, PA-C Electronically Signed   By: Judie PetitM.  Shick M.D.   On: 01/07/2017 12:39   Patient was held overnight to have her gastrostomy tube replaced.  This was done successfully by radiology this morning.  Patient's vital signs are stable.  Pt is ready for discharge   Linwood DibblesKnapp, Dai Mcadams, MD 01/07/17 1302

## 2017-01-24 ENCOUNTER — Encounter: Payer: Self-pay | Admitting: Adult Health

## 2017-01-24 ENCOUNTER — Non-Acute Institutional Stay (SKILLED_NURSING_FACILITY): Payer: Medicaid Other | Admitting: Adult Health

## 2017-01-24 DIAGNOSIS — I609 Nontraumatic subarachnoid hemorrhage, unspecified: Secondary | ICD-10-CM | POA: Diagnosis not present

## 2017-01-24 DIAGNOSIS — R1314 Dysphagia, pharyngoesophageal phase: Secondary | ICD-10-CM

## 2017-01-24 DIAGNOSIS — I69351 Hemiplegia and hemiparesis following cerebral infarction affecting right dominant side: Secondary | ICD-10-CM

## 2017-01-24 DIAGNOSIS — I10 Essential (primary) hypertension: Secondary | ICD-10-CM | POA: Diagnosis not present

## 2017-01-24 DIAGNOSIS — I639 Cerebral infarction, unspecified: Secondary | ICD-10-CM | POA: Diagnosis not present

## 2017-01-24 NOTE — Progress Notes (Signed)
Location:   [facility] Room Number: 204 A Place of Service:  SNF (31)   CODE STATUS: Full Code  No Known Allergies  Chief Complaint  Patient presents with  . Medical Management of Chronic Issues    Hypertension; dysphagia; cva    HPI:  She is a 51 year old long term resident of this facility being seen for the management of her chronic illnesses: hypertension; dysphagia; cva. She is unable to participate in the hpi or ros. She is tolerating her tube feeding without difficulty. There are no reports of uncontrolled pain; no signs of aspiration present. There are no nursing concerns at this time.   Past Medical History:  Diagnosis Date  . Acute pulmonary embolism (HCC) 02/19/2015  . Acute respiratory failure (HCC)   . Diabetes mellitus without complication (HCC)    Type 2, W/o complications  . DVT (deep venous thrombosis) (HCC) 04/05/2015  . Dysphagia   . Epilepsy (HCC)   . GERD (gastroesophageal reflux disease)   . Hyperlipidemia   . Hypertension   . IBS (irritable bowel syndrome)   . Nontraumatic subarachnoid hemorrhage (HCC)   . SAH (subarachnoid hemorrhage) (HCC)   . Urinary retention     Past Surgical History:  Procedure Laterality Date  . ABDOMINAL SURGERY    . ANEURYSM COILING    . COLONOSCOPY N/A 02/20/2015   Procedure: COLONOSCOPY;  Surgeon: Iva Boop, MD;  Location: [facility] ENDOSCOPY;  Service: Endoscopy;  Laterality: N/A;  . ESOPHAGOGASTRODUODENOSCOPY (EGD) WITH PROPOFOL N/A 02/02/2015   Procedure: ESOPHAGOGASTRODUODENOSCOPY (EGD) WITH PROPOFOL;  Surgeon: Jimmye Norman, MD;  Location: [facility] ENDOSCOPY;  Service: General;  Laterality: N/A;  . IR GASTRIC TUBE PERC CHG W/O IMG GUIDE  06/08/2016  . IR GASTRIC TUBE PERC CHG W/O IMG GUIDE  10/24/2016  . IR GASTRIC TUBE PERC CHG W/O IMG GUIDE  01/07/2017  . IR GENERIC HISTORICAL  08/26/2015   IR GASTRIC TUBE PERC CHG W/O IMG GUIDE 08/26/2015 Irish Lack, MD [facility]  . IR GENERIC HISTORICAL  09/14/2015   IR  REPLC GASTRO/COLONIC TUBE PERCUT W/FLUORO 09/14/2015 Darrell K Allred, PA-C [facility]  . PEG PLACEMENT N/A 02/02/2015   Procedure: PERCUTANEOUS ENDOSCOPIC GASTROSTOMY (PEG) PLACEMENT;  Surgeon: Jimmye Norman, MD;  Location: [facility] ENDOSCOPY;  Service: General;  Laterality: N/A;  . RADIOLOGY WITH ANESTHESIA N/A 01/13/2015   Procedure: RADIOLOGY WITH ANESTHESIA;  Surgeon: Lisbeth Renshaw, MD;  Location: [facility] OR;  Service: Radiology;  Laterality: N/A;  . TRACHEOSTOMY      Social History   Socioeconomic History  . Marital status: Single    Spouse name: Not on file  . Number of children: Not on file  . Years of education: Not on file  . Highest education level: Not on file  Social Needs  . Financial resource strain: Not on file  . Food insecurity - worry: Not on file  . Food insecurity - inability: Not on file  . Transportation needs - medical: Not on file  . Transportation needs - non-medical: Not on file  Occupational History  . Not on file  Tobacco Use  . Smoking status: Former Games developer  . Smokeless tobacco: Never Used  Substance and Sexual Activity  . Alcohol use: No  . Drug use: No  . Sexual activity: Not on file  Other Topics Concern  . Not on file  Social History Narrative  . Not on file   History reviewed. No pertinent family history.    VITAL SIGNS BP 120/80  Pulse 68   Temp 98.4 F (36.9 C)   Resp 14   Ht 5' (1.524 m)   Wt 127 lb 1.6 oz (57.7 kg)   LMP  (LMP Unknown)   SpO2 98%   BMI 24.82 kg/m   Outpatient Encounter Medications as of 01/24/2017  Medication Sig  . acetaminophen (TYLENOL) 325 MG tablet Place 650 mg into feeding tube daily. And every 4 hours as needed for mild pain,fever  . Amino Acids-Protein Hydrolys (FEEDING SUPPLEMENT, PRO-STAT SUGAR FREE 64,) LIQD Place 60 mLs into feeding tube 3 (three) times daily with meals.   Marland Kitchen. amLODipine (NORVASC) 5 MG tablet Place 5 mg into feeding tube daily.   . bethanechol (URECHOLINE) 10 MG tablet 10 mg by PEG  Tube route every 8 (eight) hours.   . cloNIDine (CATAPRES) 0.1 MG tablet Place 0.1 mg into feeding tube 2 (two) times daily.   Marland Kitchen. enoxaparin (LOVENOX) 60 MG/0.6ML injection Inject 60 mg into the skin 2 (two) times daily.   Marland Kitchen. gabapentin (NEURONTIN) 100 MG capsule Take 300 mg by mouth 2 (two) times daily. Hold if Lathargic  . hydrocortisone cream 1 % Apply 1 application topically 3 (three) times daily. Apply to abdomen for rash  . levETIRAcetam (KEPPRA) 100 MG/ML solution Place 5 mLs (500 mg total) into feeding tube 2 (two) times daily.  . metoprolol (LOPRESSOR) 100 MG tablet Place 100 mg into feeding tube 2 (two) times daily.  . metoprolol tartrate (LOPRESSOR) 25 MG tablet Place 25 mg into feeding tube 2 (two) times daily.  . Multiple Vitamins-Minerals (DECUBI-VITE) CAPS Place 1 capsule into feeding tube daily.   . Nutritional Supplements (FEEDING SUPPLEMENT, JEVITY 1.2 CAL,) LIQD 100 mls/hour, for 12 hours/day.  On at 8pm, off at 8am.  May substitute Jevity 1.5 for two cal until tow cal becomes available  . ondansetron (ZOFRAN) 4 MG tablet Take 4 mg by mouth daily.  Marland Kitchen. oxycodone (OXY-IR) 5 MG capsule Take 5 mg by mouth every 8 (eight) hours.  Marland Kitchen. oxyCODONE (ROXICODONE) 5 MG immediate release tablet Place 1 tablet (5 mg total) into feeding tube every 4 (four) hours as needed for severe pain.  . Pantoprazole Sodium POWD Give 1 packet (40 mg) via G-Tube one time a day for GERD  . polyethylene glycol (MIRALAX / GLYCOLAX) packet Place 17 g into feeding tube daily.  . tizanidine (ZANAFLEX) 6 MG capsule Place 6 mg into feeding tube 3 (three) times daily. Hold if Sleepy   . UNABLE TO FIND HSG Puree diet - HSG Puree texture, Regular consistency, may have pleasure finger foods with supervision  . UNABLE TO FIND Enternal Feet Order - Give 200 ml if water via G-Tube every 4 hours  . WATER FOR INJECTION STERILE IJ Place 30-60 mLs into feeding tube. Flush with 30-60 ml water before and after meds, before initiating  feedings or when there is an interruption of feeding to maintain patency.  Also flush every shift with 5-10 ml water between each medication  . [DISCONTINUED] oxyCODONE (OXY IR/ROXICODONE) 5 MG immediate release tablet Give 1 tablet via PEG-Tube every 8 hours and every 4 hours as needed for pain (Patient not taking: Reported on 01/24/2017)   No facility-administered encounter medications on file as of 01/24/2017.      SIGNIFICANT DIAGNOSTIC EXAMS  PREVIOUS  02-17-15: ct angio of chest: 1. Extensive pulmonary emboli involving distal main pulmonary arteries extending into lobar, segmental and subsegmental sized branches throughout the lungs bilaterally. At this time, there are no  overt findings to suggest right heart strain. 2. Cardiomegaly with left ventricular concentric hypertrophy. 3. Dependent atelectasis throughout the lower lobes of the lungs bilaterally.  02-18-15: bilateral lower extremity doppler: Findings consistent with acute deep vein thrombosis involving the left common femoral vein, left proximal profunda femoris vein, and left proximal femoral vein. Incidental findings are consistent with: enlarged lymph node on the right. - No evidence of deep vein thrombosis involving the right lower extremity. - No evidence of Baker&'s cyst on the right or left.  02-19-15: pelvic ultrasound: 1. Uterus not identified, presumed hysterectomy. No mass or free fluid seen within the midline pelvis. 2. Neither ovary is seen, perhaps bilateral oophorectomies, perhaps obscured by the fairly prominent fluid-filled bowel loops in the pelvis. No mass or free fluid seen within either adnexal region.  06-14-16: diagnostic right mammogram: benign cyst  08-02-16: left upper extremity doppler: negative for dvt  08-02-16: left hand x-ray: mild osteoarthritis   08-15-16: left hand x-ray: mild osteoarthritis no change   09-04-16: chest x-ray:  cardio retrocardiac opacities  NO NEW EXAMS     LABS REVIEWED:  PREVIOUS    04-26-16: wbc 6.9; hgb 12.;6 hct 38.0; mcv 94.4; plt 231; glucose 94; bun 10.0; creat 0.65; k+ 4.2; na++ 142; liver normal albumin 4.2; chol 177; lld 124; trig 101; hdl 33  06-14-16: wbc 8.4; hgb 11.9; hct 36.6; mcv 95.4; plt 203; glucose 134; bun 30.7; creat 0.62; k+ 4.5 ;na++ 149; liver normal albumin 4.2; vit B 12: 521; folate 19.1; chol 155; ldl 85; trig 181; hdl 34; hgb a1c 5.7   TODAY:   09-15-16: hgb a1c 5.4 09-24-16: urine micro-albumin <1.2 10-11-16: glucose 133; bun 25.6; creat 0.63; k+ 4.8; na++ 140; ca 9.9; liver normal albumin 4.3 10-22-16: glucose 131; bun 29.0; creat 0.62; k+ 4.7; na++ 141; ca 9.8; liver normal albumin 4.3 01-06-17: wb 8.1; hgb 15.0; hct 45.0; mcv  93.2; plt 199; glucose 99; bun 20; creat 0.74; k+ 4.0; na++ 142; ca 10.7    Review of Systems  Unable to perform ROS: Other (expressive aphasia )    Physical Exam  Constitutional: She appears well-developed and well-nourished. No distress.  Neck: Neck supple. No thyromegaly present.  Cardiovascular: Normal rate, regular rhythm, normal heart sounds and intact distal pulses.  Pulmonary/Chest: Effort normal and breath sounds normal. No respiratory distress.  Abdominal: Soft. Bowel sounds are normal. She exhibits no distension. There is no tenderness.  Peg tube in place  Musculoskeletal: She exhibits no edema.  Right hemiparesis   Lymphadenopathy:    She has no cervical adenopathy.  Neurological: She is alert.  Skin: Skin is warm and dry. She is not diaphoretic.  Psychiatric: She has a normal mood and affect.    ASSESSMENT/ PLAN:  TODAY  1. Essential benign hypertension: stable b/p120/80   will continue lopressor 125 mg twice daily and clonidine 0.1 mg twice daily norvasc 5 mg daily   2.  Dysphagia paraesophageal  phase : stable  no signs of aspiration present  her po intake is not adequate to maintain her body weight; she is dependent upon peg tube feeding.  3. 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   PREVIOUS  4. UTI/urine retention: stable is currently off all medications; will not make changes will not make changes.   5. Diabetes: is stable  hgb a1c is 5.4 (previous 5.7) ; is  currently off medications; will continue to monitor her status.  ldl is 85   6. Seizure: is stable  no reports of seizure activity present: will continue keppra 500 mg twice daily and will monitor   7. DVT/PE: is stable  she does require long term po anticoagulation therapy; will continue lovenox 50  mg twice daily (weigt based); is not a candidate for xarelto; or eliquis due to her history of GI bleed; she is not appropriate for coumadin therapy; unable  to obtain adequate INR.   MD is aware of resident's narcotic use and is in agreement with current plan of care. We will attempt to wean resident as apropriate     Synthia Innocent NP [facility]  Contact [phone] Monday through Friday 8am- 5pm  After hours call [phone]

## 2017-01-25 LAB — LIPID PANEL
Cholesterol: 187 (ref 0–200)
HDL: 31 — AB (ref 35–70)
LDL Cholesterol: 119
Triglycerides: 184 — AB (ref 40–160)

## 2017-01-25 LAB — HEMOGLOBIN A1C: HEMOGLOBIN A1C: 6.1

## 2017-02-14 ENCOUNTER — Other Ambulatory Visit: Payer: Self-pay

## 2017-02-14 ENCOUNTER — Encounter (HOSPITAL_COMMUNITY): Payer: Self-pay | Admitting: Emergency Medicine

## 2017-02-14 ENCOUNTER — Emergency Department (HOSPITAL_COMMUNITY): Payer: Medicaid Other

## 2017-02-14 ENCOUNTER — Non-Acute Institutional Stay (SKILLED_NURSING_FACILITY): Payer: Medicaid Other | Admitting: Adult Health

## 2017-02-14 ENCOUNTER — Encounter: Payer: Self-pay | Admitting: Adult Health

## 2017-02-14 ENCOUNTER — Inpatient Hospital Stay (HOSPITAL_COMMUNITY)
Admission: EM | Admit: 2017-02-14 | Discharge: 2017-02-24 | DRG: 871 | Disposition: A | Discharge status: Skilled Nursing Facility | Payer: Medicaid Other | Attending: Internal Medicine | Admitting: Internal Medicine

## 2017-02-14 DIAGNOSIS — R339 Retention of urine, unspecified: Secondary | ICD-10-CM | POA: Diagnosis present

## 2017-02-14 DIAGNOSIS — J690 Pneumonitis due to inhalation of food and vomit: Secondary | ICD-10-CM | POA: Diagnosis present

## 2017-02-14 DIAGNOSIS — J181 Lobar pneumonia, unspecified organism: Secondary | ICD-10-CM

## 2017-02-14 DIAGNOSIS — I69320 Aphasia following cerebral infarction: Secondary | ICD-10-CM

## 2017-02-14 DIAGNOSIS — E872 Acidosis: Secondary | ICD-10-CM | POA: Diagnosis present

## 2017-02-14 DIAGNOSIS — I69351 Hemiplegia and hemiparesis following cerebral infarction affecting right dominant side: Secondary | ICD-10-CM

## 2017-02-14 DIAGNOSIS — E1165 Type 2 diabetes mellitus with hyperglycemia: Secondary | ICD-10-CM | POA: Diagnosis present

## 2017-02-14 DIAGNOSIS — Z993 Dependence on wheelchair: Secondary | ICD-10-CM

## 2017-02-14 DIAGNOSIS — Z7901 Long term (current) use of anticoagulants: Secondary | ICD-10-CM

## 2017-02-14 DIAGNOSIS — R569 Unspecified convulsions: Secondary | ICD-10-CM

## 2017-02-14 DIAGNOSIS — K580 Irritable bowel syndrome with diarrhea: Secondary | ICD-10-CM | POA: Diagnosis present

## 2017-02-14 DIAGNOSIS — J111 Influenza due to unidentified influenza virus with other respiratory manifestations: Secondary | ICD-10-CM | POA: Diagnosis not present

## 2017-02-14 DIAGNOSIS — Z79891 Long term (current) use of opiate analgesic: Secondary | ICD-10-CM

## 2017-02-14 DIAGNOSIS — D649 Anemia, unspecified: Secondary | ICD-10-CM | POA: Diagnosis present

## 2017-02-14 DIAGNOSIS — E8770 Fluid overload, unspecified: Secondary | ICD-10-CM | POA: Diagnosis not present

## 2017-02-14 DIAGNOSIS — E860 Dehydration: Secondary | ICD-10-CM

## 2017-02-14 DIAGNOSIS — K591 Functional diarrhea: Secondary | ICD-10-CM

## 2017-02-14 DIAGNOSIS — Z86711 Personal history of pulmonary embolism: Secondary | ICD-10-CM

## 2017-02-14 DIAGNOSIS — R6520 Severe sepsis without septic shock: Secondary | ICD-10-CM

## 2017-02-14 DIAGNOSIS — Z87440 Personal history of urinary (tract) infections: Secondary | ICD-10-CM

## 2017-02-14 DIAGNOSIS — K219 Gastro-esophageal reflux disease without esophagitis: Secondary | ICD-10-CM | POA: Diagnosis present

## 2017-02-14 DIAGNOSIS — E878 Other disorders of electrolyte and fluid balance, not elsewhere classified: Secondary | ICD-10-CM | POA: Diagnosis present

## 2017-02-14 DIAGNOSIS — Z931 Gastrostomy status: Secondary | ICD-10-CM

## 2017-02-14 DIAGNOSIS — L8990 Pressure ulcer of unspecified site, unspecified stage: Secondary | ICD-10-CM

## 2017-02-14 DIAGNOSIS — A419 Sepsis, unspecified organism: Secondary | ICD-10-CM | POA: Diagnosis not present

## 2017-02-14 DIAGNOSIS — E1142 Type 2 diabetes mellitus with diabetic polyneuropathy: Secondary | ICD-10-CM | POA: Diagnosis present

## 2017-02-14 DIAGNOSIS — L89152 Pressure ulcer of sacral region, stage 2: Secondary | ICD-10-CM | POA: Diagnosis present

## 2017-02-14 DIAGNOSIS — E1149 Type 2 diabetes mellitus with other diabetic neurological complication: Secondary | ICD-10-CM | POA: Diagnosis present

## 2017-02-14 DIAGNOSIS — E870 Hyperosmolality and hypernatremia: Secondary | ICD-10-CM | POA: Diagnosis present

## 2017-02-14 DIAGNOSIS — J9621 Acute and chronic respiratory failure with hypoxia: Secondary | ICD-10-CM

## 2017-02-14 DIAGNOSIS — I82409 Acute embolism and thrombosis of unspecified deep veins of unspecified lower extremity: Secondary | ICD-10-CM | POA: Diagnosis present

## 2017-02-14 DIAGNOSIS — J90 Pleural effusion, not elsewhere classified: Secondary | ICD-10-CM

## 2017-02-14 DIAGNOSIS — I10 Essential (primary) hypertension: Secondary | ICD-10-CM | POA: Diagnosis present

## 2017-02-14 DIAGNOSIS — Z86718 Personal history of other venous thrombosis and embolism: Secondary | ICD-10-CM

## 2017-02-14 DIAGNOSIS — G40909 Epilepsy, unspecified, not intractable, without status epilepticus: Secondary | ICD-10-CM | POA: Diagnosis present

## 2017-02-14 DIAGNOSIS — Z87891 Personal history of nicotine dependence: Secondary | ICD-10-CM

## 2017-02-14 DIAGNOSIS — N179 Acute kidney failure, unspecified: Secondary | ICD-10-CM | POA: Diagnosis present

## 2017-02-14 DIAGNOSIS — D72825 Bandemia: Secondary | ICD-10-CM

## 2017-02-14 DIAGNOSIS — R509 Fever, unspecified: Secondary | ICD-10-CM

## 2017-02-14 DIAGNOSIS — J9601 Acute respiratory failure with hypoxia: Secondary | ICD-10-CM | POA: Diagnosis present

## 2017-02-14 DIAGNOSIS — E876 Hypokalemia: Secondary | ICD-10-CM | POA: Diagnosis not present

## 2017-02-14 DIAGNOSIS — E785 Hyperlipidemia, unspecified: Secondary | ICD-10-CM | POA: Diagnosis present

## 2017-02-14 DIAGNOSIS — R1310 Dysphagia, unspecified: Secondary | ICD-10-CM | POA: Diagnosis present

## 2017-02-14 DIAGNOSIS — R0602 Shortness of breath: Secondary | ICD-10-CM

## 2017-02-14 LAB — I-STAT BETA HCG BLOOD, ED (MC, WL, AP ONLY)

## 2017-02-14 LAB — CBC AND DIFFERENTIAL
HCT: 41 (ref 36–46)
Hemoglobin: 13.9 (ref 12.0–16.0)
Neutrophils Absolute: 23
Platelets: 297 (ref 150–399)
WBC: 28

## 2017-02-14 LAB — HEPATIC FUNCTION PANEL
ALT: 38 — AB (ref 7–35)
AST: 22 (ref 13–35)
Alkaline Phosphatase: 133 — AB (ref 25–125)
Bilirubin, Total: 0.3

## 2017-02-14 LAB — I-STAT CG4 LACTIC ACID, ED: LACTIC ACID, VENOUS: 2.25 mmol/L — AB (ref 0.5–1.9)

## 2017-02-14 LAB — BASIC METABOLIC PANEL
BUN: 34 — AB (ref 4–21)
CREATININE: 1 (ref 0.5–1.1)
Glucose: 165
POTASSIUM: 4.1 (ref 3.4–5.3)
Sodium: 156 — AB (ref 137–147)

## 2017-02-14 MED ORDER — PIPERACILLIN-TAZOBACTAM 3.375 G IVPB 30 MIN
3.3750 g | Freq: Once | INTRAVENOUS | Status: AC
  Administered 2017-02-15: 3.375 g via INTRAVENOUS
  Filled 2017-02-14: qty 50

## 2017-02-14 MED ORDER — SODIUM CHLORIDE 0.9 % IV BOLUS (SEPSIS)
1000.0000 mL | Freq: Once | INTRAVENOUS | Status: AC
  Administered 2017-02-14 – 2017-02-15 (×2): 1000 mL via INTRAVENOUS

## 2017-02-14 MED ORDER — SODIUM CHLORIDE 0.9 % IV BOLUS (SEPSIS)
1000.0000 mL | Freq: Once | INTRAVENOUS | Status: AC
  Administered 2017-02-15: 1000 mL via INTRAVENOUS

## 2017-02-14 MED ORDER — VANCOMYCIN HCL IN DEXTROSE 1-5 GM/200ML-% IV SOLN
1000.0000 mg | Freq: Once | INTRAVENOUS | Status: AC
  Administered 2017-02-15: 1000 mg via INTRAVENOUS
  Filled 2017-02-14: qty 200

## 2017-02-14 NOTE — Progress Notes (Signed)
Location:   [facility] Room Number: 204 A Place of Service:  SNF (31)   CODE STATUS: Full Code  No Known Allergies  Chief Complaint  Patient presents with  . Acute Visit    Flu like symptoms    HPI:  She is running a temp of 103. She is having altered mental status; is having increased difficulty staying awake. She is coughing and did indicate that she is in pain. She is unable to participate in the hpi or ros. The staff report yesterday she was at her baseline. She is tolerating her tube feeding without difficulty.    Past Medical History:  Diagnosis Date  . Acute pulmonary embolism (HCC) 02/19/2015  . Acute respiratory failure (HCC)   . Diabetes mellitus without complication (HCC)    Type 2, W/o complications  . DVT (deep venous thrombosis) (HCC) 04/05/2015  . Dysphagia   . Epilepsy (HCC)   . GERD (gastroesophageal reflux disease)   . Hyperlipidemia   . Hypertension   . IBS (irritable bowel syndrome)   . Nontraumatic subarachnoid hemorrhage (HCC)   . SAH (subarachnoid hemorrhage) (HCC)   . Urinary retention     Past Surgical History:  Procedure Laterality Date  . ABDOMINAL SURGERY    . ANEURYSM COILING    . COLONOSCOPY N/A 02/20/2015   Procedure: COLONOSCOPY;  Surgeon: Iva Boop, MD;  Location: [facility] ENDOSCOPY;  Service: Endoscopy;  Laterality: N/A;  . ESOPHAGOGASTRODUODENOSCOPY (EGD) WITH PROPOFOL N/A 02/02/2015   Procedure: ESOPHAGOGASTRODUODENOSCOPY (EGD) WITH PROPOFOL;  Surgeon: Jimmye Norman, MD;  Location: [facility] ENDOSCOPY;  Service: General;  Laterality: N/A;  . IR GENERIC HISTORICAL  08/26/2015   IR GASTRIC TUBE PERC CHG W/O IMG GUIDE 08/26/2015 Irish Lack, MD [facility]  . IR GENERIC HISTORICAL  09/14/2015   IR REPLC GASTRO/COLONIC TUBE PERCUT W/FLUORO 09/14/2015 Darrell K Allred, PA-C [facility]  . IR REPLACE G-TUBE SIMPLE WO FLUORO  06/08/2016  . IR REPLACE G-TUBE SIMPLE WO FLUORO  10/24/2016  . IR REPLACE G-TUBE SIMPLE WO FLUORO  01/07/2017    . PEG PLACEMENT N/A 02/02/2015   Procedure: PERCUTANEOUS ENDOSCOPIC GASTROSTOMY (PEG) PLACEMENT;  Surgeon: Jimmye Norman, MD;  Location: [facility] ENDOSCOPY;  Service: General;  Laterality: N/A;  . RADIOLOGY WITH ANESTHESIA N/A 01/13/2015   Procedure: RADIOLOGY WITH ANESTHESIA;  Surgeon: Lisbeth Renshaw, MD;  Location: [facility] OR;  Service: Radiology;  Laterality: N/A;  . TRACHEOSTOMY      Social History   Socioeconomic History  . Marital status: Single    Spouse name: Not on file  . Number of children: Not on file  . Years of education: Not on file  . Highest education level: Not on file  Social Needs  . Financial resource strain: Not on file  . Food insecurity - worry: Not on file  . Food insecurity - inability: Not on file  . Transportation needs - medical: Not on file  . Transportation needs - non-medical: Not on file  Occupational History  . Not on file  Tobacco Use  . Smoking status: Former Games developer  . Smokeless tobacco: Never Used  Substance and Sexual Activity  . Alcohol use: No  . Drug use: No  . Sexual activity: Not on file  Other Topics Concern  . Not on file  Social History Narrative  . Not on file   Family History  Problem Relation Age of Onset  . Hypertension Other       VITAL SIGNS BP 140/80   Pulse 70  Temp (!) 103 F (39.4 C)   Resp 18   Ht 5' (1.524 m)   Wt 131 lb (59.4 kg)   LMP  (LMP Unknown)   SpO2 97%   BMI 25.58 kg/m   No facility-administered encounter medications on file as of 02/14/2017.    Outpatient Encounter Medications as of 02/14/2017  Medication Sig  . acetaminophen (TYLENOL) 325 MG tablet Place 650 mg into feeding tube daily. And every 4 hours as needed for mild pain,fever  . Amino Acids-Protein Hydrolys (FEEDING SUPPLEMENT, PRO-STAT SUGAR FREE 64,) LIQD Place 60 mLs into feeding tube 3 (three) times daily with meals.   Marland Kitchen amLODipine (NORVASC) 5 MG tablet Place 5 mg into feeding tube daily.   . bethanechol (URECHOLINE) 10 MG tablet 10 mg  by PEG Tube route every 8 (eight) hours.   . cloNIDine (CATAPRES) 0.1 MG tablet Place 0.1 mg into feeding tube 2 (two) times daily.   Marland Kitchen enoxaparin (LOVENOX) 60 MG/0.6ML injection Inject 60 mg into the skin 2 (two) times daily.   Marland Kitchen gabapentin (NEURONTIN) 100 MG capsule Take 300 mg by mouth 2 (two) times daily. Hold if Lathargic  . hydrocortisone cream 1 % Apply 1 application topically 3 (three) times daily. Apply to abdomen for rash  . levETIRAcetam (KEPPRA) 100 MG/ML solution Place 5 mLs (500 mg total) into feeding tube 2 (two) times daily.  . metoprolol (LOPRESSOR) 100 MG tablet Place 100 mg into feeding tube 2 (two) times daily.  . metoprolol tartrate (LOPRESSOR) 25 MG tablet Place 25 mg into feeding tube 2 (two) times daily.  . Multiple Vitamins-Minerals (DECUBI-VITE) CAPS Place 1 capsule into feeding tube daily.   . Nutritional Supplements (FEEDING SUPPLEMENT, JEVITY 1.2 CAL,) LIQD 100 mls/hour, for 12 hours/day.  On at 8pm, off at 8am.  May substitute Jevity 1.5 for two cal until tow cal becomes available  . ondansetron (ZOFRAN) 4 MG tablet Take 4 mg by mouth daily.  Marland Kitchen oxycodone (OXY-IR) 5 MG capsule Take 5 mg by mouth every 8 (eight) hours.  Marland Kitchen oxyCODONE (ROXICODONE) 5 MG immediate release tablet Place 1 tablet (5 mg total) into feeding tube every 4 (four) hours as needed for severe pain.  . Pantoprazole Sodium POWD Give 1 packet (40 mg) via G-Tube one time a day for GERD  . polyethylene glycol (MIRALAX / GLYCOLAX) packet Place 17 g into feeding tube daily.  . tizanidine (ZANAFLEX) 6 MG capsule Place 6 mg into feeding tube 3 (three) times daily. Hold if Sleepy   . UNABLE TO FIND HSG Puree diet - HSG Puree texture, Regular consistency, may have pleasure finger foods with supervision  . UNABLE TO FIND Enternal Feet Order - Give 200 ml if water via G-Tube every 4 hours  . WATER FOR INJECTION STERILE IJ Place 30-60 mLs into feeding tube. Flush with 30-60 ml water before and after meds, before  initiating feedings or when there is an interruption of feeding to maintain patency.  Also flush every shift with 5-10 ml water between each medication     SIGNIFICANT DIAGNOSTIC EXAMS  PREVIOUS  02-17-15: ct angio of chest: 1. Extensive pulmonary emboli involving distal main pulmonary arteries extending into lobar, segmental and subsegmental sized branches throughout the lungs bilaterally. At this time, there are no overt findings to suggest right heart strain. 2. Cardiomegaly with left ventricular concentric hypertrophy. 3. Dependent atelectasis throughout the lower lobes of the lungs bilaterally.  02-18-15: bilateral lower extremity doppler: Findings consistent with acute deep vein thrombosis  involving the left common femoral vein, left proximal profunda femoris vein, and left proximal femoral vein. Incidental findings are consistent with: enlarged lymph node on the right. - No evidence of deep vein thrombosis involving the right lower extremity. - No evidence of Baker&'s cyst on the right or left.  02-19-15: pelvic ultrasound: 1. Uterus not identified, presumed hysterectomy. No mass or free fluid seen within the midline pelvis. 2. Neither ovary is seen, perhaps bilateral oophorectomies, perhaps obscured by the fairly prominent fluid-filled bowel loops in the pelvis. No mass or free fluid seen within either adnexal region.  06-14-16: diagnostic right mammogram: benign cyst  08-02-16: left upper extremity doppler: negative for dvt  08-02-16: left hand x-ray: mild osteoarthritis   08-15-16: left hand x-ray: mild osteoarthritis no change   09-04-16: chest x-ray:  cardio retrocardiac opacities  NO NEW EXAMS     LABS REVIEWED: PREVIOUS    04-26-16: wbc 6.9; hgb 12.;6 hct 38.0; mcv 94.4; plt 231; glucose 94; bun 10.0; creat 0.65; k+ 4.2; na++ 142; liver normal albumin 4.2; chol 177; lld 124; trig 101; hdl 33  06-14-16: wbc 8.4; hgb 11.9; hct 36.6; mcv 95.4; plt 203; glucose 134; bun 30.7; creat  0.62; k+ 4.5 ;na++ 149; liver normal albumin 4.2; vit B 12: 521; folate 19.1; chol 155; ldl 85; trig 181; hdl 34; hgb a1c 5.7  8-18: hgb a1c 5.4 09-24-16: urine micro-albumin <1.2 10-11-16: glucose 133; bun 25.6; creat 0.63; k+ 4.8; na++ 140; ca 9.9; liver normal albumin 4.3 10-22-16: glucose 131; bun 29.0; creat 0.62; k+ 4.7; na++ 141; ca 9.8; liver normal albumin 4.3 01-06-17: wb 8.1; hgb 15.0; hct 45.0; mcv  93.2; plt 199; glucose 99; bun 20; creat 0.74; k+ 4.0; na++ 142; ca 10.7  NO NEW LABS.     Review of Systems  Unable to perform ROS: Other (expressive aphasia )  Eyes: Negative for redness.    Physical Exam  Constitutional: She appears well-developed and well-nourished. She appears distressed.  Neck: No thyromegaly present.  Cardiovascular: Normal rate, regular rhythm, normal heart sounds and intact distal pulses.  Pulmonary/Chest: Effort normal. No respiratory distress.  Breath sounds diminished bilaterally    Abdominal: Soft. Bowel sounds are normal. She exhibits no distension. There is no tenderness.  Peg tube present without signs of infection present.   Musculoskeletal: She exhibits no edema.  Right hemiparesis   Lymphadenopathy:    She has no cervical adenopathy.  Neurological:  Is aware  Skin: Skin is warm. She is diaphoretic.  Psychiatric:  Is lethargic     ASSESSMENT/ PLAN:  TODAY  1.  Influenza 2. Question sepsis  Will begin tamiflu 75 mg twice daily for 5 days Will get rapid flu swab Will get cbc; cmp; chest x-ray blood cultures X2 and ua/c&s    MD is aware of resident's narcotic use and is in agreement with current plan of care. We will attempt to wean resident as apropriate     Synthia Innocenteborah Jamarcus Laduke NP [facility]  Contact [phone] Monday through Friday 8am- 5pm  After hours call [phone]

## 2017-02-14 NOTE — ED Triage Notes (Signed)
Pt brought to ED by [facility] from SNF for c/o 103 feveer 6 hour ago Tylenol given at SNF when temp recheck 100, pt normally Alert not verbal due to old stroke. VS BP 118/84, HR 88, R 18, SPO2 98% RA

## 2017-02-15 ENCOUNTER — Encounter (HOSPITAL_COMMUNITY): Payer: Self-pay | Admitting: Internal Medicine

## 2017-02-15 DIAGNOSIS — D72825 Bandemia: Secondary | ICD-10-CM | POA: Diagnosis not present

## 2017-02-15 DIAGNOSIS — E870 Hyperosmolality and hypernatremia: Secondary | ICD-10-CM | POA: Diagnosis present

## 2017-02-15 DIAGNOSIS — E1149 Type 2 diabetes mellitus with other diabetic neurological complication: Secondary | ICD-10-CM | POA: Diagnosis not present

## 2017-02-15 DIAGNOSIS — E785 Hyperlipidemia, unspecified: Secondary | ICD-10-CM | POA: Diagnosis present

## 2017-02-15 DIAGNOSIS — I69320 Aphasia following cerebral infarction: Secondary | ICD-10-CM | POA: Diagnosis not present

## 2017-02-15 DIAGNOSIS — R197 Diarrhea, unspecified: Secondary | ICD-10-CM | POA: Diagnosis not present

## 2017-02-15 DIAGNOSIS — R6520 Severe sepsis without septic shock: Secondary | ICD-10-CM

## 2017-02-15 DIAGNOSIS — A419 Sepsis, unspecified organism: Secondary | ICD-10-CM | POA: Diagnosis present

## 2017-02-15 DIAGNOSIS — J690 Pneumonitis due to inhalation of food and vomit: Secondary | ICD-10-CM | POA: Diagnosis present

## 2017-02-15 DIAGNOSIS — J9621 Acute and chronic respiratory failure with hypoxia: Secondary | ICD-10-CM | POA: Diagnosis not present

## 2017-02-15 DIAGNOSIS — R339 Retention of urine, unspecified: Secondary | ICD-10-CM | POA: Diagnosis present

## 2017-02-15 DIAGNOSIS — K219 Gastro-esophageal reflux disease without esophagitis: Secondary | ICD-10-CM | POA: Diagnosis present

## 2017-02-15 DIAGNOSIS — I10 Essential (primary) hypertension: Secondary | ICD-10-CM | POA: Diagnosis present

## 2017-02-15 DIAGNOSIS — I69351 Hemiplegia and hemiparesis following cerebral infarction affecting right dominant side: Secondary | ICD-10-CM | POA: Diagnosis not present

## 2017-02-15 DIAGNOSIS — J9601 Acute respiratory failure with hypoxia: Secondary | ICD-10-CM | POA: Diagnosis present

## 2017-02-15 DIAGNOSIS — D649 Anemia, unspecified: Secondary | ICD-10-CM | POA: Diagnosis present

## 2017-02-15 DIAGNOSIS — R0602 Shortness of breath: Secondary | ICD-10-CM | POA: Diagnosis not present

## 2017-02-15 DIAGNOSIS — E8770 Fluid overload, unspecified: Secondary | ICD-10-CM | POA: Diagnosis not present

## 2017-02-15 DIAGNOSIS — K580 Irritable bowel syndrome with diarrhea: Secondary | ICD-10-CM | POA: Diagnosis present

## 2017-02-15 DIAGNOSIS — E860 Dehydration: Secondary | ICD-10-CM | POA: Diagnosis present

## 2017-02-15 DIAGNOSIS — G40909 Epilepsy, unspecified, not intractable, without status epilepticus: Secondary | ICD-10-CM | POA: Diagnosis present

## 2017-02-15 DIAGNOSIS — E876 Hypokalemia: Secondary | ICD-10-CM | POA: Diagnosis not present

## 2017-02-15 DIAGNOSIS — N179 Acute kidney failure, unspecified: Secondary | ICD-10-CM | POA: Diagnosis present

## 2017-02-15 DIAGNOSIS — R1310 Dysphagia, unspecified: Secondary | ICD-10-CM | POA: Diagnosis present

## 2017-02-15 DIAGNOSIS — E1142 Type 2 diabetes mellitus with diabetic polyneuropathy: Secondary | ICD-10-CM | POA: Diagnosis present

## 2017-02-15 DIAGNOSIS — L89152 Pressure ulcer of sacral region, stage 2: Secondary | ICD-10-CM | POA: Diagnosis present

## 2017-02-15 DIAGNOSIS — R509 Fever, unspecified: Secondary | ICD-10-CM | POA: Diagnosis present

## 2017-02-15 DIAGNOSIS — Z993 Dependence on wheelchair: Secondary | ICD-10-CM | POA: Diagnosis not present

## 2017-02-15 DIAGNOSIS — E878 Other disorders of electrolyte and fluid balance, not elsewhere classified: Secondary | ICD-10-CM | POA: Diagnosis present

## 2017-02-15 DIAGNOSIS — E872 Acidosis: Secondary | ICD-10-CM | POA: Diagnosis present

## 2017-02-15 DIAGNOSIS — E1165 Type 2 diabetes mellitus with hyperglycemia: Secondary | ICD-10-CM | POA: Diagnosis present

## 2017-02-15 DIAGNOSIS — I825Y2 Chronic embolism and thrombosis of unspecified deep veins of left proximal lower extremity: Secondary | ICD-10-CM | POA: Diagnosis not present

## 2017-02-15 DIAGNOSIS — R569 Unspecified convulsions: Secondary | ICD-10-CM | POA: Diagnosis not present

## 2017-02-15 LAB — GLUCOSE, CAPILLARY
GLUCOSE-CAPILLARY: 313 mg/dL — AB (ref 65–99)
Glucose-Capillary: 85 mg/dL (ref 65–99)

## 2017-02-15 LAB — COMPREHENSIVE METABOLIC PANEL
ALBUMIN: 3 g/dL — AB (ref 3.5–5.0)
ALK PHOS: 112 U/L (ref 38–126)
ALK PHOS: 102 U/L (ref 38–126)
ALT: 38 U/L (ref 14–54)
ALT: 33 U/L (ref 14–54)
ANION GAP: 14 (ref 5–15)
AST: 26 U/L (ref 15–41)
AST: 27 U/L (ref 15–41)
Albumin: 3.4 g/dL — ABNORMAL LOW (ref 3.5–5.0)
Anion gap: 13 (ref 5–15)
BUN: 41 mg/dL — ABNORMAL HIGH (ref 6–20)
BUN: 32 mg/dL — ABNORMAL HIGH (ref 6–20)
CALCIUM: 9.9 mg/dL (ref 8.9–10.3)
CALCIUM: 8.6 mg/dL — AB (ref 8.9–10.3)
CO2: 26 mmol/L (ref 22–32)
CO2: 22 mmol/L (ref 22–32)
CREATININE: 1.37 mg/dL — AB (ref 0.44–1.00)
Chloride: 116 mmol/L — ABNORMAL HIGH (ref 101–111)
Chloride: 118 mmol/L — ABNORMAL HIGH (ref 101–111)
Creatinine, Ser: 1.06 mg/dL — ABNORMAL HIGH (ref 0.44–1.00)
GFR calc Af Amer: >60 mL/min (ref >60)
GFR calc non Af Amer: 43 mL/min — ABNORMAL LOW (ref >60)
GFR calc non Af Amer: 59 mL/min — ABNORMAL LOW (ref >60)
GFR, EST AFRICAN AMERICAN: 50 mL/min — AB (ref >60)
GLUCOSE: 105 mg/dL — AB (ref 65–99)
Glucose, Bld: 210 mg/dL — ABNORMAL HIGH (ref 65–99)
Potassium: 3.6 mmol/L (ref 3.5–5.1)
Potassium: 4 mmol/L (ref 3.5–5.1)
SODIUM: 154 mmol/L — AB (ref 135–145)
Sodium: 155 mmol/L — ABNORMAL HIGH (ref 135–145)
Total Bilirubin: 0.2 mg/dL — ABNORMAL LOW (ref 0.3–1.2)
Total Bilirubin: 0.7 mg/dL (ref 0.3–1.2)
Total Protein: 8.2 g/dL — ABNORMAL HIGH (ref 6.5–8.1)
Total Protein: 7.1 g/dL (ref 6.5–8.1)

## 2017-02-15 LAB — CBC WITH DIFFERENTIAL/PLATELET
BASOS ABS: 0 10*3/uL (ref 0.0–0.1)
BASOS PCT: 0 %
Basophils Absolute: 0 10*3/uL (ref 0.0–0.1)
Basophils Relative: 0 %
EOS ABS: 0 10*3/uL (ref 0.0–0.7)
Eosinophils Absolute: 0 10*3/uL (ref 0.0–0.7)
Eosinophils Relative: 0 %
Eosinophils Relative: 0 %
HCT: 42.8 % (ref 36.0–46.0)
HCT: 37.3 % (ref 36.0–46.0)
HEMOGLOBIN: 11.1 g/dL — AB (ref 12.0–15.0)
Hemoglobin: 13.4 g/dL (ref 12.0–15.0)
LYMPHS ABS: 3.7 10*3/uL (ref 0.7–4.0)
LYMPHS PCT: 16 %
Lymphocytes Relative: 11 %
Lymphs Abs: 5.2 10*3/uL — ABNORMAL HIGH (ref 0.7–4.0)
MCH: 31.2 pg (ref 26.0–34.0)
MCH: 29.8 pg (ref 26.0–34.0)
MCHC: 31.3 g/dL (ref 30.0–36.0)
MCHC: 29.8 g/dL — AB (ref 30.0–36.0)
MCV: 99.5 fL (ref 78.0–100.0)
MCV: 100.3 fL — ABNORMAL HIGH (ref 78.0–100.0)
MONO ABS: 2 10*3/uL — AB (ref 0.1–1.0)
Monocytes Absolute: 2.3 10*3/uL — ABNORMAL HIGH (ref 0.1–1.0)
Monocytes Relative: 6 %
Monocytes Relative: 7 %
NEUTROS PCT: 83 %
NEUTROS PCT: 77 %
Neutro Abs: 28 10*3/uL — ABNORMAL HIGH (ref 1.7–7.7)
Neutro Abs: 25 10*3/uL — ABNORMAL HIGH (ref 1.7–7.7)
PLATELETS: 279 10*3/uL (ref 150–400)
Platelets: 216 10*3/uL (ref 150–400)
RBC: 4.3 MIL/uL (ref 3.87–5.11)
RBC: 3.72 MIL/uL — ABNORMAL LOW (ref 3.87–5.11)
RDW: 15.4 % (ref 11.5–15.5)
RDW: 15.5 % (ref 11.5–15.5)
SMEAR REVIEW: ADEQUATE
WBC: 33.7 10*3/uL — AB (ref 4.0–10.5)
WBC: 32.5 10*3/uL — AB (ref 4.0–10.5)

## 2017-02-15 LAB — HEMOGLOBIN A1C
Hgb A1c MFr Bld: 6.1 % — ABNORMAL HIGH (ref 4.8–5.6)
Mean Plasma Glucose: 128.37 mg/dL

## 2017-02-15 LAB — BASIC METABOLIC PANEL
Anion gap: 9 (ref 5–15)
BUN: 21 mg/dL — AB (ref 6–20)
CALCIUM: 8.6 mg/dL — AB (ref 8.9–10.3)
CO2: 22 mmol/L (ref 22–32)
CREATININE: 0.86 mg/dL (ref 0.44–1.00)
Chloride: 119 mmol/L — ABNORMAL HIGH (ref 101–111)
GFR calc Af Amer: >60 mL/min (ref >60)
GFR calc non Af Amer: >60 mL/min (ref >60)
GLUCOSE: 306 mg/dL — AB (ref 65–99)
Potassium: 3.2 mmol/L — ABNORMAL LOW (ref 3.5–5.1)
Sodium: 150 mmol/L — ABNORMAL HIGH (ref 135–145)

## 2017-02-15 LAB — URINALYSIS, ROUTINE W REFLEX MICROSCOPIC
Bilirubin Urine: NEGATIVE
Glucose, UA: NEGATIVE mg/dL
Hgb urine dipstick: NEGATIVE
KETONES UR: NEGATIVE mg/dL
NITRITE: NEGATIVE
PROTEIN: 100 mg/dL — AB
Specific Gravity, Urine: 1.031 — ABNORMAL HIGH (ref 1.005–1.030)
pH: 5 (ref 5.0–8.0)

## 2017-02-15 LAB — CBG MONITORING, ED
Glucose-Capillary: 118 mg/dL — ABNORMAL HIGH (ref 65–99)
Glucose-Capillary: 106 mg/dL — ABNORMAL HIGH (ref 65–99)
Glucose-Capillary: 296 mg/dL — ABNORMAL HIGH (ref 65–99)

## 2017-02-15 LAB — PROTIME-INR
INR: 1.29
INR: 1.35
PROTHROMBIN TIME: 15.9 s — AB (ref 11.4–15.2)
Prothrombin Time: 16.5 seconds — ABNORMAL HIGH (ref 11.4–15.2)

## 2017-02-15 LAB — LACTIC ACID, PLASMA
Lactic Acid, Venous: 2.4 mmol/L (ref 0.5–1.9)
Lactic Acid, Venous: 2 mmol/L (ref 0.5–1.9)

## 2017-02-15 LAB — I-STAT CG4 LACTIC ACID, ED: Lactic Acid, Venous: 2.1 mmol/L (ref 0.5–1.9)

## 2017-02-15 LAB — HIV ANTIBODY (ROUTINE TESTING W REFLEX): HIV SCREEN 4TH GENERATION: NONREACTIVE

## 2017-02-15 LAB — MRSA PCR SCREENING: MRSA BY PCR: NEGATIVE

## 2017-02-15 LAB — INFLUENZA PANEL BY PCR (TYPE A & B)
INFLAPCR: NEGATIVE
INFLBPCR: NEGATIVE

## 2017-02-15 LAB — APTT: APTT: 25 s (ref 24–36)

## 2017-02-15 LAB — PROCALCITONIN: PROCALCITONIN: 0.29 ng/mL

## 2017-02-15 MED ORDER — SODIUM CHLORIDE 0.9 % IV SOLN
INTRAVENOUS | Status: DC
  Administered 2017-02-15: 125 mL/h via INTRAVENOUS

## 2017-02-15 MED ORDER — INSULIN ASPART 100 UNIT/ML ~~LOC~~ SOLN
0.0000 [IU] | SUBCUTANEOUS | Status: DC
  Administered 2017-02-15: 7 [IU] via SUBCUTANEOUS
  Administered 2017-02-15: 5 [IU] via SUBCUTANEOUS
  Administered 2017-02-16: 1 [IU] via SUBCUTANEOUS
  Administered 2017-02-16: 3 [IU] via SUBCUTANEOUS
  Administered 2017-02-16: 2 [IU] via SUBCUTANEOUS
  Administered 2017-02-16 (×2): 1 [IU] via SUBCUTANEOUS
  Administered 2017-02-17: 2 [IU] via SUBCUTANEOUS
  Administered 2017-02-17: 1 [IU] via SUBCUTANEOUS
  Administered 2017-02-18: 2 [IU] via SUBCUTANEOUS
  Administered 2017-02-18 – 2017-02-19 (×3): 1 [IU] via SUBCUTANEOUS
  Administered 2017-02-19: 2 [IU] via SUBCUTANEOUS
  Administered 2017-02-19 – 2017-02-20 (×4): 1 [IU] via SUBCUTANEOUS
  Administered 2017-02-21: 3 [IU] via SUBCUTANEOUS
  Administered 2017-02-21 – 2017-02-22 (×3): 1 [IU] via SUBCUTANEOUS
  Administered 2017-02-22 (×2): 2 [IU] via SUBCUTANEOUS
  Administered 2017-02-23 (×3): 1 [IU] via SUBCUTANEOUS
  Filled 2017-02-15: qty 1

## 2017-02-15 MED ORDER — SODIUM CHLORIDE 0.9 % IV BOLUS (SEPSIS): 500.0000 mL | Freq: Once | INTRAVENOUS | Status: DC

## 2017-02-15 MED ORDER — JEVITY 1.5 CAL/FIBER PO LIQD
1000.0000 mL | ORAL | Status: DC
  Administered 2017-02-15 – 2017-02-16 (×3): 1000 mL
  Filled 2017-02-15 (×6): qty 1000

## 2017-02-15 MED ORDER — VANCOMYCIN HCL IN DEXTROSE 1-5 GM/200ML-% IV SOLN
1000.0000 mg | INTRAVENOUS | Status: DC
  Administered 2017-02-15 – 2017-02-16 (×2): 1000 mg via INTRAVENOUS
  Filled 2017-02-15 (×2): qty 200

## 2017-02-15 MED ORDER — METOPROLOL TARTRATE 25 MG/10 ML ORAL SUSPENSION
125.0000 mg | Freq: Two times a day (BID) | ORAL | Status: DC
  Administered 2017-02-15: 125 mg
  Filled 2017-02-15: qty 60

## 2017-02-15 MED ORDER — TIZANIDINE HCL 2 MG PO TABS
6.0000 mg | ORAL_TABLET | Freq: Three times a day (TID) | ORAL | Status: DC
  Administered 2017-02-15 – 2017-02-24 (×26): 6 mg
  Filled 2017-02-15 (×7): qty 3
  Filled 2017-02-15: qty 1
  Filled 2017-02-15 (×5): qty 3
  Filled 2017-02-15 (×2): qty 1
  Filled 2017-02-15 (×4): qty 3
  Filled 2017-02-15: qty 1
  Filled 2017-02-15 (×2): qty 3
  Filled 2017-02-15: qty 1
  Filled 2017-02-15 (×6): qty 3

## 2017-02-15 MED ORDER — ACETAMINOPHEN 650 MG RE SUPP
650.0000 mg | Freq: Four times a day (QID) | RECTAL | Status: DC | PRN
  Administered 2017-02-16: 650 mg via RECTAL
  Filled 2017-02-15: qty 1

## 2017-02-15 MED ORDER — PANTOPRAZOLE SODIUM POWD: 40.0000 mg | Freq: Every day | Status: DC

## 2017-02-15 MED ORDER — ONDANSETRON HCL 4 MG PO TABS: 4.0000 mg | ORAL_TABLET | Freq: Four times a day (QID) | ORAL | Status: DC | PRN

## 2017-02-15 MED ORDER — ACETAMINOPHEN 650 MG RE SUPP: 650.0000 mg | Freq: Once | RECTAL | Status: DC

## 2017-02-15 MED ORDER — LEVETIRACETAM 100 MG/ML PO SOLN
500.0000 mg | Freq: Two times a day (BID) | ORAL | Status: DC
  Administered 2017-02-15 – 2017-02-24 (×19): 500 mg
  Filled 2017-02-15 (×21): qty 5

## 2017-02-15 MED ORDER — POTASSIUM CHLORIDE 20 MEQ/15ML (10%) PO SOLN
40.0000 meq | Freq: Every day | ORAL | Status: DC
  Administered 2017-02-15: 40 meq via ORAL
  Filled 2017-02-15: qty 30

## 2017-02-15 MED ORDER — METOPROLOL TARTRATE 25 MG PO TABS: 25.0000 mg | ORAL_TABLET | Freq: Two times a day (BID) | ORAL | Status: DC

## 2017-02-15 MED ORDER — GABAPENTIN 250 MG/5ML PO SOLN
300.0000 mg | Freq: Two times a day (BID) | ORAL | Status: DC
  Administered 2017-02-15 – 2017-02-24 (×19): 300 mg
  Filled 2017-02-15 (×27): qty 6

## 2017-02-15 MED ORDER — METOPROLOL TARTRATE 25 MG PO TABS: 100.0000 mg | ORAL_TABLET | Freq: Two times a day (BID) | ORAL | Status: DC

## 2017-02-15 MED ORDER — ACETAMINOPHEN 325 MG PO TABS
650.0000 mg | ORAL_TABLET | Freq: Four times a day (QID) | ORAL | Status: DC | PRN
  Administered 2017-02-15 – 2017-02-22 (×5): 650 mg via ORAL
  Filled 2017-02-15 (×5): qty 2

## 2017-02-15 MED ORDER — PANTOPRAZOLE SODIUM 40 MG PO PACK
40.0000 mg | PACK | Freq: Every day | ORAL | Status: DC
  Administered 2017-02-15 – 2017-02-17 (×3): 40 mg
  Filled 2017-02-15 (×3): qty 20

## 2017-02-15 MED ORDER — PIPERACILLIN-TAZOBACTAM 3.375 G IVPB
3.3750 g | Freq: Three times a day (TID) | INTRAVENOUS | Status: DC
  Administered 2017-02-15 – 2017-02-18 (×8): 3.375 g via INTRAVENOUS
  Filled 2017-02-15 (×11): qty 50

## 2017-02-15 MED ORDER — ENOXAPARIN SODIUM 60 MG/0.6ML ~~LOC~~ SOLN
60.0000 mg | Freq: Two times a day (BID) | SUBCUTANEOUS | Status: DC
Start: 2017-02-15 — End: 2017-02-24
  Administered 2017-02-15 – 2017-02-24 (×19): 60 mg via SUBCUTANEOUS
  Filled 2017-02-15 (×19): qty 0.6

## 2017-02-15 MED ORDER — OXYCODONE HCL 5 MG PO TABS
5.0000 mg | ORAL_TABLET | ORAL | Status: DC | PRN
Start: 2017-02-15 — End: 2017-02-24
  Administered 2017-02-15 – 2017-02-24 (×18): 5 mg
  Filled 2017-02-15 (×19): qty 1

## 2017-02-15 MED ORDER — AMLODIPINE BESYLATE 5 MG PO TABS
5.0000 mg | ORAL_TABLET | Freq: Every day | ORAL | Status: DC
  Filled 2017-02-15: qty 1

## 2017-02-15 MED ORDER — ADULT MULTIVITAMIN LIQUID CH
15.0000 mL | Freq: Every day | ORAL | Status: DC
  Administered 2017-02-15 – 2017-02-24 (×10): 15 mL
  Filled 2017-02-15 (×11): qty 15

## 2017-02-15 MED ORDER — SODIUM CHLORIDE 0.45 % IV SOLN
INTRAVENOUS | Status: DC
  Administered 2017-02-15: 125 mL/h via INTRAVENOUS
  Administered 2017-02-15: 20:00:00 via INTRAVENOUS

## 2017-02-15 MED ORDER — POTASSIUM CHLORIDE CRYS ER 20 MEQ PO TBCR: 40.0000 meq | EXTENDED_RELEASE_TABLET | Freq: Every day | ORAL | Status: DC

## 2017-02-15 MED ORDER — PRO-STAT SUGAR FREE PO LIQD
60.0000 mL | Freq: Three times a day (TID) | ORAL | Status: DC
  Administered 2017-02-15 – 2017-02-20 (×14): 60 mL
  Filled 2017-02-15 (×19): qty 60

## 2017-02-15 MED ORDER — TIZANIDINE HCL 2 MG PO TABS
6.0000 mg | ORAL_TABLET | Freq: Three times a day (TID) | ORAL | Status: DC
  Administered 2017-02-15: 6 mg via ORAL
  Filled 2017-02-15 (×5): qty 3

## 2017-02-15 MED ORDER — ONDANSETRON HCL 4 MG/2ML IJ SOLN: 4.0000 mg | Freq: Four times a day (QID) | INTRAMUSCULAR | Status: DC | PRN

## 2017-02-15 MED ORDER — PIPERACILLIN-TAZOBACTAM 3.375 G IVPB
3.3750 g | Freq: Three times a day (TID) | INTRAVENOUS | Status: DC
  Administered 2017-02-15: 3.375 g via INTRAVENOUS
  Filled 2017-02-15 (×4): qty 50

## 2017-02-15 MED ORDER — SODIUM CHLORIDE 0.9 % IV BOLUS (SEPSIS)
500.0000 mL | Freq: Once | INTRAVENOUS | Status: AC
  Administered 2017-02-15: 500 mL via INTRAVENOUS

## 2017-02-15 MED ORDER — CLONIDINE HCL 0.1 MG PO TABS
0.1000 mg | ORAL_TABLET | Freq: Two times a day (BID) | ORAL | Status: DC
  Filled 2017-02-15: qty 1

## 2017-02-15 MED ORDER — POLYETHYLENE GLYCOL 3350 17 G PO PACK
17.0000 g | PACK | Freq: Every day | ORAL | Status: DC
  Administered 2017-02-15 – 2017-02-20 (×3): 17 g
  Filled 2017-02-15 (×6): qty 1

## 2017-02-15 MED ORDER — BETHANECHOL CHLORIDE 10 MG PO TABS
10.0000 mg | ORAL_TABLET | Freq: Three times a day (TID) | ORAL | Status: DC
  Administered 2017-02-15 – 2017-02-24 (×28): 10 mg
  Filled 2017-02-15 (×34): qty 1

## 2017-02-15 NOTE — ED Notes (Signed)
[facility] is from [facility].

## 2017-02-15 NOTE — ED Notes (Signed)
Ordered Kangaroo pump from Facilities.

## 2017-02-15 NOTE — H&P (Signed)
History and Physical    Rachel Vang:[id number] DOB: [birth year] DOA: 02/14/2017  PCP: Patient, No Pcp Per  Patient coming from: Skilled nursing facility.  Chief Complaint: Fever.  HPI: Rachel Vang is a 52 y.o. female with history of intracranial hemorrhage/CVAs patient is presently aphasic, hypertension, sacral decubitus ulcer, history of seizures was brought to the ER the patient was found to have fever and chills.  No reported nausea vomiting diarrhea or chest pain or shortness of breath.  Patient is aphasic and cannot provide a history.  Most of the history was obtained from the ER physician.  ED Course: In the ER patient is found to be tachycardic fever 103 F lactate was elevated WBC count was around 30,000.  Blood cultures were obtained and patient was started on fluid bolus for sepsis.  Patient is empirically started on antibiotics.  Influenza PCR was negative.  Chest x-ray UA does not show any definite infiltrates.  Sacral decubitus does not look infected.  Review of Systems: As per HPI, rest all negative.   Past Medical History:  Diagnosis Date  . Acute pulmonary embolism (HCC) 02/19/2015  . Acute respiratory failure (HCC)   . Diabetes mellitus without complication (HCC)    Type 2, W/o complications  . DVT (deep venous thrombosis) (HCC) 04/05/2015  . Dysphagia   . Epilepsy (HCC)   . GERD (gastroesophageal reflux disease)   . Hyperlipidemia   . Hypertension   . IBS (irritable bowel syndrome)   . Nontraumatic subarachnoid hemorrhage (HCC)   . SAH (subarachnoid hemorrhage) (HCC)   . Urinary retention     Past Surgical History:  Procedure Laterality Date  . ABDOMINAL SURGERY    . ANEURYSM COILING    . COLONOSCOPY N/A 02/20/2015   Procedure: COLONOSCOPY;  Surgeon: Iva Boop, MD;  Location: [facility] ENDOSCOPY;  Service: Endoscopy;  Laterality: N/A;  . ESOPHAGOGASTRODUODENOSCOPY (EGD) WITH PROPOFOL N/A 02/02/2015   Procedure: ESOPHAGOGASTRODUODENOSCOPY (EGD) WITH  PROPOFOL;  Surgeon: Jimmye Norman, MD;  Location: [facility] ENDOSCOPY;  Service: General;  Laterality: N/A;  . IR GENERIC HISTORICAL  08/26/2015   IR GASTRIC TUBE PERC CHG W/O IMG GUIDE 08/26/2015 Irish Lack, MD [facility]-INTERV RAD  . IR GENERIC HISTORICAL  09/14/2015   IR REPLC GASTRO/COLONIC TUBE PERCUT W/FLUORO 09/14/2015 Darrell K Allred, PA-C [facility]INTER[facility]  . IR REPLACE G-TUBE SIMPLE WO FLUORO  06/08/2016  . IR REPLACE G-TUBE SIMPLE WO FLUORO  10/24/2016  . IR REPLACE G-TUBE SIMPLE WO FLUORO  01/07/2017  . PEG PLACEMENT N/A 02/02/2015   Procedure: PERCUTANEOUS ENDOSCOPIC GASTROSTOMY (PEG) PLACEMENT;  Surgeon: Jimmye Norman, MD;  Location: [facility] ENDOSCOPY;  Service: General;  Laterality: N/A;  . RADIOLOGY WITH ANESTHESIA N/A 01/13/2015   Procedure: RADIOLOGY WITH ANESTHESIA;  Surgeon: Lisbeth Renshaw, MD;  Location: [facility] OR;  Service: Radiology;  Laterality: N/A;  . TRACHEOSTOMY       reports that she has quit smoking. she has never used smokeless tobacco. She reports that she does not drink alcohol or use drugs.  No Known Allergies  Family History  Problem Relation Age of Onset  . Hypertension Other     Prior to Admission medications   Medication Sig Start Date End Date Taking? Authorizing Provider  acetaminophen (TYLENOL) 325 MG tablet Place 650 mg into feeding tube daily. And every 4 hours as needed for mild pain,fever    [provider]  Amino Acids-Protein Hydrolys (FEEDING SUPPLEMENT, PRO-STAT SUGAR FREE 64,) LIQD Place 60 mLs into feeding tube 3 (three)  times daily with meals.     [provider]  amLODipine (NORVASC) 5 MG tablet Place 5 mg into feeding tube daily.     [provider]  bethanechol (URECHOLINE) 10 MG tablet 10 mg by PEG Tube route every 8 (eight) hours.     [provider]  cloNIDine (CATAPRES) 0.1 MG tablet Place 0.1 mg into feeding tube 2 (two) times daily.     [provider]  enoxaparin (LOVENOX) 60 MG/0.6ML injection Inject 60 mg  into the skin 2 (two) times daily.     [provider]  gabapentin (NEURONTIN) 100 MG capsule Take 300 mg by mouth 2 (two) times daily. Hold if Lathargic 11/30/16   [provider]  hydrocortisone cream 1 % Apply 1 application topically 3 (three) times daily. Apply to abdomen for rash    [provider]  levETIRAcetam (KEPPRA) 100 MG/ML solution Place 5 mLs (500 mg total) into feeding tube 2 (two) times daily. 02/11/15   Leroy SeaSingh, Prashant K, MD  metoprolol (LOPRESSOR) 100 MG tablet Place 100 mg into feeding tube 2 (two) times daily.    [provider]  metoprolol tartrate (LOPRESSOR) 25 MG tablet Place 25 mg into feeding tube 2 (two) times daily.    [provider]  Multiple Vitamins-Minerals (DECUBI-VITE) CAPS Place 1 capsule into feeding tube daily.     [provider]  Nutritional Supplements (FEEDING SUPPLEMENT, JEVITY 1.2 CAL,) LIQD 100 mls/hour, for 12 hours/day.  On at 8pm, off at 8am.  May substitute Jevity 1.5 for two cal until tow cal becomes available    [provider]  ondansetron (ZOFRAN) 4 MG tablet Take 4 mg by mouth daily.    [provider]  oxycodone (OXY-IR) 5 MG capsule Take 5 mg by mouth every 8 (eight) hours.    [provider]  oxyCODONE (ROXICODONE) 5 MG immediate release tablet Place 1 tablet (5 mg total) into feeding tube every 4 (four) hours as needed for severe pain. 10/29/16   Sharee HolsterGreen, Deborah S, NP  Pantoprazole Sodium POWD Give 1 packet (40 mg) via G-Tube one time a day for GERD    [provider]  polyethylene glycol (MIRALAX / GLYCOLAX) packet Place 17 g into feeding tube daily.    [provider]  tizanidine (ZANAFLEX) 6 MG capsule Place 6 mg into feeding tube 3 (three) times daily. Hold if Sleepy  12/15/16   [provider]  UNABLE TO FIND HSG Puree diet - HSG Puree texture, Regular consistency, may have pleasure finger foods with supervision    [provider]  UNABLE TO FIND Enternal Feet Order - Give 200 ml if water via G-Tube every 4 hours    [provider]  WATER FOR INJECTION STERILE IJ Place 30-60 mLs into feeding tube. Flush with 30-60 ml water before and after meds, before initiating feedings or when there is an interruption of feeding to maintain patency.  Also flush every shift with 5-10 ml water between each medication    [provider]    Physical Exam: Vitals:   02/15/17 0245 02/15/17 0300 02/15/17 0306 02/15/17 0315  BP: 106/82 104/84  111/85  Pulse: (!) 104     Resp: (!) 22 13  15   Temp:   98.3 F (36.8 C)   TempSrc:   Oral   SpO2: 96%     Weight:      Height:          Constitutional: Moderately  built and nourished. Vitals:   02/15/17 0245 02/15/17 0300 02/15/17 0306 02/15/17 0315  BP: 106/82 104/84  111/85  Pulse: (!) 104     Resp: (!) 22 13  15   Temp:   98.3 F (36.8 C)   TempSrc:   Oral   SpO2: 96%     Weight:      Height:       Eyes: Anicteric no pallor. ENMT: No discharge from the ears eyes nose or mouth. Neck: No mass felt.  No neck rigidity. Respiratory: No rhonchi or crepitations. Cardiovascular: S1-S2 heard no murmurs appreciated. Abdomen: Soft nontender PEG tube site does not show any signs of infection. Musculoskeletal: No edema. Skin: Sacral decubitus ulcer stage II not infected. Neurologic: Alert awake oriented to her name follows commands but cannot talk due to aphasia. Psychiatric: Appears normal.   Labs on Admission: I have personally reviewed following labs and imaging studies  CBC: Recent Labs  Lab 02/14/17 2319  WBC 33.7*  NEUTROABS 28.0*  HGB 13.4  HCT 42.8  MCV 99.5  PLT 279   Basic Metabolic Panel: Recent Labs  Lab 02/14/17 2319  NA 155*  K 3.6  CL 116*  CO2 26  GLUCOSE 210*  BUN 41*  CREATININE 1.37*  CALCIUM 9.9   GFR: Estimated Creatinine Clearance: 38.3 mL/min (A) (by C-G formula based on SCr of 1.37 mg/dL (H)). Liver Function  Tests: Recent Labs  Lab 02/14/17 2319  AST 26  ALT 38  ALKPHOS 112  BILITOT 0.2*  PROT 8.2*  ALBUMIN 3.4*   No results for input(s): LIPASE, AMYLASE in the last 168 hours. No results for input(s): AMMONIA in the last 168 hours. Coagulation Profile: Recent Labs  Lab 02/14/17 2319  INR 1.29   Cardiac Enzymes: No results for input(s): CKTOTAL, CKMB, CKMBINDEX, TROPONINI in the last 168 hours. BNP (last 3 results) No results for input(s): PROBNP in the last 8760 hours. HbA1C: No results for input(s): HGBA1C in the last 72 hours. CBG: No results for input(s): GLUCAP in the last 168 hours. Lipid Profile: No results for input(s): CHOL, HDL, LDLCALC, TRIG, CHOLHDL, LDLDIRECT in the last 72 hours. Thyroid Function Tests: No results for input(s): TSH, T4TOTAL, FREET4, T3FREE, THYROIDAB in the last 72 hours. Anemia Panel: No results for input(s): VITAMINB12, FOLATE, FERRITIN, TIBC, IRON, RETICCTPCT in the last 72 hours. Urine analysis:    Component Value Date/Time   COLORURINE YELLOW 02/15/2017 0010   APPEARANCEUR HAZY (A) 02/15/2017 0010   LABSPEC 1.031 (H) 02/15/2017 0010   PHURINE 5.0 02/15/2017 0010   GLUCOSEU NEGATIVE 02/15/2017 0010   HGBUR NEGATIVE 02/15/2017 0010   BILIRUBINUR NEGATIVE 02/15/2017 0010   KETONESUR NEGATIVE 02/15/2017 0010   PROTEINUR 100 (A) 02/15/2017 0010   NITRITE NEGATIVE 02/15/2017 0010   LEUKOCYTESUR TRACE (A) 02/15/2017 0010   Sepsis Labs: @LABRCNTIP (procalcitonin:4,lacticidven:4) )No results found for this or any previous visit (from the past 240 hour(s)).   Radiological Exams on Admission: Dg Chest 2 View  Result Date: 02/15/2017 CLINICAL DATA:  Sepsis EXAM: CHEST  2 VIEW COMPARISON:  03/16/2015 FINDINGS: Low lung volumes. No consolidation or effusion. Stable borderline cardiomegaly with aortic atherosclerosis. No pneumothorax. IMPRESSION: No active cardiopulmonary disease. Electronically Signed   By: Jasmine Pang M.D.   On: 02/15/2017  00:01    Assessment/Plan Principal Problem:   Sepsis (HCC) Active Problems:   Seizures (HCC)   DVT (deep venous thrombosis) (HCC)   Type II diabetes mellitus with neurological manifestations (HCC)   Hemiparesis affecting  right side as late effect of stroke (HCC)    1. Sepsis -source not clear.  Patient is empirically placed on antibiotics follow cultures continue with hydration follow pro calcitonin lactate levels.  Need to get further history from family if available in the morning. 2. Acute renal failure with hypernatremia -unsure of patient's oral intake.  I will not sure if patient had any diarrhea.  For now we will keep patient on the fluids will change to half-normal saline since patient already received a fluid bolus.  Follow metabolic panel closely. 3. Hypertension on amlodipine metoprolol and clonidine. 4. History of PE and DVT on Lovenox. 5. Diabetes mellitus type 2 per the chart presently not on medications.  Will keep patient on sliding scale coverage. 6. History of PEG tube feeds which will be continued. 7. No history of seizures on Keppra. 8. History of stroke and intracranial hemorrhage with aphasia.   DVT prophylaxis: Coumadin. Code Status: Full code. Family Communication: No family at the bedside. Disposition Plan: Skilled nursing facility. Consults called: None. Admission status: Inpatient.   Eduard Clos MD [facility] Pager [phone].  If 7PM-7AM, please contact night-coverage [email or web address] Password [facility]  02/15/2017, 3:48 AM

## 2017-02-15 NOTE — Progress Notes (Signed)
Pharmacy Antibiotic Note  Rachel Vang is a 52 y.o. female admitted on 02/14/2017 with sepsis.  Pharmacy has been consulted for Vancomycin/Zosyn dosing. Pt from nursing facility with fever. Tmax 103.2. Found to have elevated WBC of 33.7. Noted renal dysfunction with Scr 1.37 (baseline is ~0.6).   Plan: Vancomycin 1000 mg IV q24h Zosyn 3.375G IV q8h to be infused over 4 hours Trend WBC, temp, renal function  F/U infectious work-up Drug levels as indicated   Height: 5' (152.4 cm) Weight: 131 lb (59.4 kg) IBW/kg (Calculated) : 45.5  Temp (24hrs), Avg:101.8 F (38.8 C), Min:99.2 F (37.3 C), Max:103.2 F (39.6 C)  Recent Labs  Lab 02/14/17 2319 02/14/17 2335  WBC 33.7*  --   CREATININE 1.37*  --   LATICACIDVEN  --  2.25*    Estimated Creatinine Clearance: 38.3 mL/min (A) (by C-G formula based on SCr of 1.37 mg/dL (H)).    No Known Allergies  Abran DukeLedford, Kahleah Crass 02/15/2017 12:40 AM

## 2017-02-15 NOTE — ED Provider Notes (Addendum)
[facility] EMERGENCY DEPARTMENT Provider Note   CSN: [id number] Arrival date & time: 02/14/17  2304     History   Chief Complaint Chief Complaint  Patient presents with  . Fever    HPI Rachel Vang is a 52 y.o. female with a hx of pulmonary embolism, diabetes (no medications on MAR), DVT, dysphasia, GERD, hypertension, nontraumatic subarachnoid hemorrhage, a aphasia presents to the Emergency Department from skilled nursing facility complaining of gradual, persistent, progressively worsening fever.  Patient is nonverbal.  Level 5 caveat.  Per EMS, skilled nursing facility found patient with fever of 103 approximately 6 hours ago.  They gave Tylenol and on temperature repeat check she was found to be 100.0.  Baseline mental status for patient is alert but nonverbal due to previous stroke.  The history is provided by the patient and medical records. No language interpreter was used.    Past Medical History:  Diagnosis Date  . Acute pulmonary embolism (HCC) 02/19/2015  . Acute respiratory failure (HCC)   . Diabetes mellitus without complication (HCC)    Type 2, W/o complications  . DVT (deep venous thrombosis) (HCC) 04/05/2015  . Dysphagia   . Epilepsy (HCC)   . GERD (gastroesophageal reflux disease)   . Hyperlipidemia   . Hypertension   . IBS (irritable bowel syndrome)   . Nontraumatic subarachnoid hemorrhage (HCC)   . SAH (subarachnoid hemorrhage) (HCC)   . Urinary retention     Patient Active Problem List   Diagnosis Date Noted  . Sepsis (HCC) 02/15/2017  . Foot drop, right foot 10/18/2016  . Cough, persistent 09/17/2016  . CVA (cerebrovascular accident) (HCC) 08/30/2016  . GERD without esophagitis 08/30/2016  . History of stroke 08/16/2016  . Localized swelling on left hand 08/16/2016  . Pain and swelling of left wrist 08/16/2016  . Chronic pulmonary embolism (HCC) 07/03/2016  . Weight loss, non-intentional 06/13/2016  . Hemiparesis  affecting right side as late effect of stroke (HCC) 09/25/2015  . Type II diabetes mellitus with neurological manifestations (HCC) 05/20/2015  . Essential hypertension, benign 05/20/2015  . S/P percutaneous endoscopic gastrostomy (PEG) tube placement (HCC) 05/20/2015  . DVT (deep venous thrombosis) (HCC) 04/05/2015  . Protein-calorie malnutrition, severe (HCC) 03/25/2015  . Dysphagia 02/18/2015  . Seizures (HCC) 02/18/2015  . Urine retention 02/18/2015  . History of ETT   . Subarachnoid hemorrhage [facility])     Past Surgical History:  Procedure Laterality Date  . ABDOMINAL SURGERY    . ANEURYSM COILING    . COLONOSCOPY N/A 02/20/2015   Procedure: COLONOSCOPY;  Surgeon: Iva Boop, MD;  Location: [facility] ENDOSCOPY;  Service: Endoscopy;  Laterality: N/A;  . ESOPHAGOGASTRODUODENOSCOPY (EGD) WITH PROPOFOL N/A 02/02/2015   Procedure: ESOPHAGOGASTRODUODENOSCOPY (EGD) WITH PROPOFOL;  Surgeon: Jimmye Norman, MD;  Location: [facility] ENDOSCOPY;  Service: General;  Laterality: N/A;  . IR GENERIC HISTORICAL  08/26/2015   IR GASTRIC TUBE PERC CHG W/O IMG GUIDE 08/26/2015 Irish Lack, MD [facility]-INTERV RAD  . IR GENERIC HISTORICAL  09/14/2015   IR REPLC GASTRO/COLONIC TUBE PERCUT W/FLUORO 09/14/2015 Darrell K Allred, PA-C [facility]-INTERV RAD  . IR REPLACE G-TUBE SIMPLE WO FLUORO  06/08/2016  . IR REPLACE G-TUBE SIMPLE WO FLUORO  10/24/2016  . IR REPLACE G-TUBE SIMPLE WO FLUORO  01/07/2017  . PEG PLACEMENT N/A 02/02/2015   Procedure: PERCUTANEOUS ENDOSCOPIC GASTROSTOMY (PEG) PLACEMENT;  Surgeon: Jimmye Norman, MD;  Location: [facility] ENDOSCOPY;  Service: General;  Laterality: N/A;  . RADIOLOGY WITH ANESTHESIA N/A 01/13/2015  Procedure: RADIOLOGY WITH ANESTHESIA;  Surgeon: Lisbeth Renshaw, MD;  Location: [facility] OR;  Service: Radiology;  Laterality: N/A;  . TRACHEOSTOMY      OB History    No data available       Home Medications    Prior to Admission medications   Medication Sig Start Date End Date Taking? Authorizing Provider    acetaminophen (TYLENOL) 325 MG tablet Place 650 mg into feeding tube daily. And every 4 hours as needed for mild pain,fever    [provider]  Amino Acids-Protein Hydrolys (FEEDING SUPPLEMENT, PRO-STAT SUGAR FREE 64,) LIQD Place 60 mLs into feeding tube 3 (three) times daily with meals.     [provider]  amLODipine (NORVASC) 5 MG tablet Place 5 mg into feeding tube daily.     [provider]  bethanechol (URECHOLINE) 10 MG tablet 10 mg by PEG Tube route every 8 (eight) hours.     [provider]  cloNIDine (CATAPRES) 0.1 MG tablet Place 0.1 mg into feeding tube 2 (two) times daily.     [provider]  enoxaparin (LOVENOX) 60 MG/0.6ML injection Inject 60 mg into the skin 2 (two) times daily.     [provider]  gabapentin (NEURONTIN) 100 MG capsule Take 300 mg by mouth 2 (two) times daily. Hold if Lathargic 11/30/16   [provider]  hydrocortisone cream 1 % Apply 1 application topically 3 (three) times daily. Apply to abdomen for rash    [provider]  levETIRAcetam (KEPPRA) 100 MG/ML solution Place 5 mLs (500 mg total) into feeding tube 2 (two) times daily. 02/11/15   Leroy Sea, MD  metoprolol (LOPRESSOR) 100 MG tablet Place 100 mg into feeding tube 2 (two) times daily.    [provider]  metoprolol tartrate (LOPRESSOR) 25 MG tablet Place 25 mg into feeding tube 2 (two) times daily.    [provider]  Multiple Vitamins-Minerals (DECUBI-VITE) CAPS Place 1 capsule into feeding tube daily.     [provider]  Nutritional Supplements (FEEDING SUPPLEMENT, JEVITY 1.2 CAL,) LIQD 100 mls/hour, for 12 hours/day.  On at 8pm, off at 8am.  May substitute Jevity 1.5 for two cal until tow cal becomes available    [provider]  ondansetron (ZOFRAN) 4 MG tablet Take 4 mg by mouth daily.    [provider]  oxycodone (OXY-IR) 5 MG capsule Take 5 mg by mouth every 8 (eight) hours.     [provider]  oxyCODONE (ROXICODONE) 5 MG immediate release tablet Place 1 tablet (5 mg total) into feeding tube every 4 (four) hours as needed for severe pain. 10/29/16   Sharee Holster, NP  Pantoprazole Sodium POWD Give 1 packet (40 mg) via G-Tube one time a day for GERD    [provider]  polyethylene glycol (MIRALAX / GLYCOLAX) packet Place 17 g into feeding tube daily.    [provider]  tizanidine (ZANAFLEX) 6 MG capsule Place 6 mg into feeding tube 3 (three) times daily. Hold if Sleepy  12/15/16   [provider]  UNABLE TO FIND HSG Puree diet - HSG Puree texture, Regular consistency, may have pleasure finger foods with supervision    [provider]  UNABLE TO FIND Enternal Feet Order - Give 200 ml if water via G-Tube every 4 hours    [provider]  WATER FOR INJECTION STERILE IJ Place 30-60 mLs into feeding tube. Flush with 30-60 ml water before and after  meds, before initiating feedings or when there is an interruption of feeding to maintain patency.  Also flush every shift with 5-10 ml water between each medication    [provider]    Family History No family history on file.  Social History Social History   Tobacco Use  . Smoking status: Former Games developer  . Smokeless tobacco: Never Used  Substance Use Topics  . Alcohol use: No  . Drug use: No     Allergies   Patient has no known allergies.   Review of Systems Review of Systems  Unable to perform ROS: Patient nonverbal     Physical Exam Updated Vital Signs BP 108/78   Pulse (!) 114   Temp (!) 103.2 F (39.6 C) (Rectal)   Resp 15   Ht 5' (1.524 m)   Wt 59.4 kg (131 lb)   LMP  (LMP Unknown)   SpO2 97%   BMI 25.58 kg/m   Physical Exam  Constitutional: She appears well-developed and well-nourished. No distress.  Awake, alert, nontoxic appearance  HENT:  Head: Normocephalic and atraumatic.  Mouth/Throat: Oropharynx is clear and moist.  No oropharyngeal exudate.  Eyes: Conjunctivae are normal. No scleral icterus.  Neck: Normal range of motion. Neck supple.  Cardiovascular: Normal rate, regular rhythm and intact distal pulses.  Pulmonary/Chest: Effort normal. No respiratory distress. She has decreased breath sounds ( Throughout). She has no wheezes.  Equal chest expansion No cough.  Abdominal: Soft. Bowel sounds are normal. She exhibits no mass. There is no tenderness. There is no rebound and no guarding.  Genitourinary:  Genitourinary Comments: Stage I pressure ulcer to the sacrum, no open lesions or signs of cellulitis.  Musculoskeletal: Normal range of motion. She exhibits no edema.  Neurological: She is alert.  Speech is clear and goal oriented Moves extremities without ataxia  Skin: Skin is warm and dry. She is not diaphoretic.  Hot to the touch No rash, petechiae or purpura. Open lesions or signs of cellulitis  Psychiatric: She has a normal mood and affect.  Nursing note and vitals reviewed.    ED Treatments / Results  Labs (all labs ordered are listed, but only abnormal results are displayed) Labs Reviewed  COMPREHENSIVE METABOLIC PANEL - Abnormal; Notable for the following components:      Result Value   Sodium 155 (*)    Chloride 116 (*)    Glucose, Bld 210 (*)    BUN 41 (*)    Creatinine, Ser 1.37 (*)    Total Protein 8.2 (*)    Albumin 3.4 (*)    Total Bilirubin 0.2 (*)    GFR calc non Af Amer 43 (*)    GFR calc Af Amer 50 (*)    All other components within normal limits  CBC WITH DIFFERENTIAL/PLATELET - Abnormal; Notable for the following components:   WBC 33.7 (*)    Neutro Abs 28.0 (*)    Monocytes Absolute 2.0 (*)    All other components within normal limits  PROTIME-INR - Abnormal; Notable for the following components:   Prothrombin Time 15.9 (*)    All other components within normal limits  URINALYSIS, ROUTINE W REFLEX MICROSCOPIC - Abnormal; Notable for the following components:    APPearance HAZY (*)    Specific Gravity, Urine 1.031 (*)    Protein, ur 100 (*)    Leukocytes, UA TRACE (*)    Bacteria, UA RARE (*)    Squamous Epithelial / LPF 0-5 (*)    All  other components within normal limits  I-STAT CG4 LACTIC ACID, ED - Abnormal; Notable for the following components:   Lactic Acid, Venous 2.25 (*)    All other components within normal limits  I-STAT CG4 LACTIC ACID, ED - Abnormal; Notable for the following components:   Lactic Acid, Venous 2.10 (*)    All other components within normal limits  CULTURE, BLOOD (ROUTINE X 2)  CULTURE, BLOOD (ROUTINE X 2)  INFLUENZA PANEL BY PCR (TYPE A & B)  I-STAT BETA HCG BLOOD, ED ([facility], [facility], [facility] ONLY)    EKG  EKG Interpretation  Date/Time:  Thursday February 14 2017 23:14:37 EST Ventricular Rate:  119 PR Interval:    QRS Duration: 87 QT Interval:  289 QTC Calculation: 407 R Axis:   51 Text Interpretation:  Sinus tachycardia Ventricular premature complex Nonspecific T abnormalities, diffuse leads Baseline wander in lead(s) V6 Similar to prior. No STEMI.  Confirmed by Alona Bene [phone]) on 02/14/2017 11:18:22 PM       Radiology Dg Chest 2 View  Result Date: 02/15/2017 CLINICAL DATA:  Sepsis EXAM: CHEST  2 VIEW COMPARISON:  03/16/2015 FINDINGS: Low lung volumes. No consolidation or effusion. Stable borderline cardiomegaly with aortic atherosclerosis. No pneumothorax. IMPRESSION: No active cardiopulmonary disease. Electronically Signed   By: Jasmine Pang M.D.   On: 02/15/2017 00:01    Procedures Procedures (including critical care time)  CRITICAL CARE Performed by: Dahlia Client Shandra Szymborski Total critical care time: 30 minutes Critical care time was exclusive of separately billable procedures and treating other patients. Critical care was necessary to treat or prevent imminent or life-threatening deterioration. Critical care was time spent personally by me on the following activities: development of treatment plan with  patient and/or surrogate as well as nursing, discussions with consultants, evaluation of patient's response to treatment, examination of patient, obtaining history from patient or surrogate, ordering and performing treatments and interventions, ordering and review of laboratory studies, ordering and review of radiographic studies, pulse oximetry and re-evaluation of patient's condition.   Medications Ordered in ED Medications  piperacillin-tazobactam (ZOSYN) IVPB 3.375 g (not administered)  vancomycin (VANCOCIN) IVPB 1000 mg/200 mL premix (not administered)  acetaminophen (TYLENOL) suppository 650 mg (not administered)  sodium chloride 0.9 % bolus 1,000 mL (0 mLs Intravenous Stopped 02/15/17 0054)    And  sodium chloride 0.9 % bolus 1,000 mL (1,000 mLs Intravenous New Bag/Given 02/15/17 0058)  piperacillin-tazobactam (ZOSYN) IVPB 3.375 g (0 g Intravenous Stopped 02/15/17 0054)  vancomycin (VANCOCIN) IVPB 1000 mg/200 mL premix (1,000 mg Intravenous New Bag/Given 02/15/17 0016)     Initial Impression / Assessment and Plan / ED Course  I have reviewed the triage vital signs and the nursing notes.  Pertinent labs & imaging results that were available during my care of the patient were reviewed by me and considered in my medical decision making (see chart for details).  Clinical Course as of Feb 16 155  Caleen Essex Feb 15, 2017  6045 Discussed with Dr. Toniann Fail who will admit.    [HM]    Clinical Course User Index [HM] Jaquay Posthumus, Dahlia Client, [place]    Patient presents with sepsis.  Fever, tachycardia, lactic acid of 2.25.  Sepsis protocol initiated and broad-spectrum antibiotics given.  Patient arrives with a heart rate of 129.  After 30 cc/kg bolus heart rate improved to 102.  Patient without hypotension.  Rectal temp of 103 here in the emergency department.  Significant leukocytosis 33.7.  Unknown source at this time.  Influenza pending.  Urinalysis without  evidence of urinate tract infection.   Diminished breath sounds throughout but no focal breath sounds.  Chest x-ray without evidence of pulmonary edema or pneumonia.  No signs of cellulitis on the patient's skin.  Abdomen is soft, without rigidity or distention.  The patient was discussed with and seen by Dr. Elesa MassedWard who agrees with the treatment plan.   Final Clinical Impressions(s) / ED Diagnoses   Final diagnoses:  Fever, unspecified fever cause  Sepsis, due to unspecified organism [facility](HCC)    ED Discharge Orders    None       Diany Formosa, Boyd KerbsHannah, PA-C 02/15/17 0157    Ward, Layla MawKristen N, DO 02/15/17 0223    Dierdre ForthMuthersbaugh, Kali Deadwyler, PA-C 02/25/17 0752    Ward, Layla MawKristen N, DO 02/25/17 2318

## 2017-02-15 NOTE — Progress Notes (Signed)
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 difficile, chronic pain on fentanyl, prior pressure ulcers on sacrum and heel, prior GI bleed 02/20/2015 diverticulosis, internal hemorrhoids  Baseline is able to verbalize but is dependant for ADLS found to have fever chills lactate elevated temperature 103 WBC 30 empiric antibiotic started-hypernatremic 154 BUN/creatinine 41/1.3 baseline 29/0.6  Assessment & Plan:   Assessment:  The primary encounter diagnosis was Fever, unspecified fever cause. A diagnosis of Sepsis, due to unspecified organism [facility](HCC) was also pertinent to this visit.  Sepsis etiology unknown-continue broad-spectrum vancomycin/Zosyn, follow blood culture urine culture-no sacral decubiti nor heel decubituus per my exam Hypernatremia-continue 0.45 saline 125 cc/h, might need to change to D5 water to allow for drop in sodium, recheck labs 12 PM and make adjustments as needed Diabetes mellitus type 2-history of not apparently on any meds monitor-continue gabapentin 300 3 times daily Hypertension continue amlodipine 5, metoprolol 25 twice daily, clonidine 0.1 twice daily, not hypotensive currently Prior intracranial bleed 12/2014 resulting in aphasia and PEG tube-monitor mentation would hold OxyIR oxycodone, tizanidine and other meds with potential for confusion-would continue Keppra 5 mils twice daily Reflux continue pantoprazole 40 mg per tube   DVT prophylaxis: Lovenoex  Code Status:   prsumed full   Family Communication:   Patient wishes me to updat her only  Disposition Plan:  Inpatient SDU, 2-3 days at least   Consultants:    none  Procedures:   none  Antimicrobials:    Vanc  Zosyn from 1/18    Subjective:  Awake alert verbalizing c/o some abd pain No diarr no n/v Coughs a couple of times when I am in room  Objective: Vitals:   02/15/17 0600 02/15/17 0615 02/15/17 0630 02/15/17 0700  BP: (!) 113/97 110/87 121/81 107/84  Pulse: (!) 102 (!) 106 (!) 108 (!) 107  Resp: 15 19 (!) 21 (!) 21  Temp:      TempSrc:      SpO2: 98% (!) 88% (!) 89% 92%  Weight:      Height:        Intake/Output Summary (Last 24 hours) at 02/15/2017 0742 Last data filed at 02/15/2017 0548 Gross per 24 hour  Intake 3700 ml  Output 500 ml  Net 3200 ml   Filed Weights   02/14/17 2315  Weight: 59.4 kg (131 lb)    Examination:  Chr ill appearing female looks about stated age eomi ncat no pallr s1 s 2 slight tachy, no M rrr abd soft no rebound no guard Sacrum clean with no decubiti-heels wnln  Data Reviewed: I have personally reviewed following labs and imaging studies  CBC: Recent Labs  Lab 02/14/17 2319 02/15/17 0342  WBC 33.7* 32.5*  NEUTROABS 28.0* 25.0*  HGB 13.4 11.1*  HCT 42.8 37.3  MCV 99.5 100.3*  PLT 279 216   Basic Metabolic Panel: Recent Labs  Lab 02/14/17 2319 02/15/17 0342  NA 155* 154*  K 3.6 4.0  CL 116* 118*  CO2 26 22  GLUCOSE 210* 105*  BUN 41* 32*  CREATININE 1.37* 1.06*  CALCIUM 9.9 8.6*   GFR: Estimated Creatinine Clearance: 49.5 mL/min (A) (by C-G formula based on SCr of 1.06 mg/dL (H)). Liver Function Tests: Recent Labs  Lab 02/14/17 2319  02/15/17 0342  AST 26 27  ALT 38 33  ALKPHOS 112 102  BILITOT 0.2* 0.7  PROT 8.2* 7.1  ALBUMIN 3.4* 3.0*   No results for input(s): LIPASE, AMYLASE in the last 168 hours. No results for input(s): AMMONIA in the last 168 hours. Coagulation Profile: Recent Labs  Lab 02/14/17 2319 02/15/17 0342  INR 1.29 1.35   Cardiac Enzymes: No results for input(s): CKTOTAL, CKMB, CKMBINDEX, TROPONINI in the last 168 hours. CBG: Recent Labs  Lab 02/15/17 0445  GLUCAP 118*   Urine analysis:     Component Value Date/Time   COLORURINE YELLOW 02/15/2017 0010   APPEARANCEUR HAZY (A) 02/15/2017 0010   LABSPEC 1.031 (H) 02/15/2017 0010   PHURINE 5.0 02/15/2017 0010   GLUCOSEU NEGATIVE 02/15/2017 0010   HGBUR NEGATIVE 02/15/2017 0010   BILIRUBINUR NEGATIVE 02/15/2017 0010   KETONESUR NEGATIVE 02/15/2017 0010   PROTEINUR 100 (A) 02/15/2017 0010   NITRITE NEGATIVE 02/15/2017 0010   LEUKOCYTESUR TRACE (A) 02/15/2017 0010     Radiology Studies: Reviewed images personally in health database    Scheduled Meds: . acetaminophen  650 mg Rectal Once  . amLODipine  5 mg Per Tube Daily  . bethanechol  10 mg Per Tube Q8H  . cloNIDine  0.1 mg Per Tube BID  . enoxaparin (LOVENOX) injection  60 mg Subcutaneous BID  . feeding supplement (PRO-STAT SUGAR FREE 64)  60 mL Per Tube TID WC  . gabapentin  300 mg Per Tube BID  . insulin aspart  0-9 Units Subcutaneous Q4H  . levETIRAcetam  500 mg Per Tube BID  . metoprolol tartrate  125 mg Per Tube BID  . multivitamin  15 mL Per Tube Daily  . polyethylene glycol  17 g Per Tube Daily  . tiZANidine  6 mg Oral TID   Continuous Infusions: . sodium chloride 125 mL/hr (02/15/17 0547)  . feeding supplement (JEVITY 1.5 CAL/FIBER)    . piperacillin-tazobactam (ZOSYN)  IV 3.375 g (02/15/17 1610)  . vancomycin       LOS: 0 days    Time spent: no charge    Pleas Koch, MD [facility] ([phone]   If 7PM-7AM, please contact night-coverage [email or web address] Password [facility] 02/15/2017, 7:42 AM

## 2017-02-15 NOTE — ED Notes (Signed)
Dr. Mahala [place]iniSam[place] [phone]-per RN

## 2017-02-15 NOTE — Progress Notes (Signed)
Initial Nutrition Assessment  DOCUMENTATION CODES:   Not applicable  INTERVENTION:    Continue Jevity 1.5 at 100 ml/h x 12 hours 8pm-8am via PEG  Pro-stat 30 ml TID via PEG  Provides 2100 kcal, 122 gm protein, 912 ml free water daily  Recommend free water flushes 200 ml every 4 hours (6 times per day) for a total of 2112 ml free water daily  NUTRITION DIAGNOSIS:   Inadequate oral intake related to inability to eat as evidenced by NPO status.  GOAL:   Patient will meet greater than or equal to 90% of their needs  MONITOR:   TF tolerance, I & O's, Skin, Diet advancement  REASON FOR ASSESSMENT:   New TF    ASSESSMENT:   52 yo female with PMH of ICH, CVA, HTN, sacral decubitus, seizures, PEG, who was admitted on 1/17 with tachycardia, fever, chills. Work-up ongoing.   Patient did not awaken when name called. RN reports that patient is alert without aphasia when awake. Per review of medical chart, patient receives Jevity 1.5 at 100 ml/h from 8pm to 8am with Pro-stat 30 ml TID at SNF. RN reports that patient's aunt said patient was taking liquids PO PTA. Currently NPO. Labs reviewed. Sodium 150 (H) CBG's: [phone] Medications reviewed and include MVI, KCl.  NUTRITION - FOCUSED PHYSICAL EXAM:    Most Recent Value  Orbital Region  No depletion  Upper Arm Region  No depletion  Thoracic and Lumbar Region  Unable to assess  Buccal Region  No depletion  Temple Region  No depletion  Clavicle Bone Region  No depletion  Clavicle and Acromion Bone Region  No depletion  Scapular Bone Region  No depletion  Dorsal Hand  Unable to assess  Patellar Region  Moderate depletion  Anterior Thigh Region  Moderate depletion  Posterior Calf Region  Moderate depletion  Edema (RD Assessment)  None  Hair  Reviewed  Eyes  Unable to assess  Mouth  Unable to assess  Skin  Reviewed  Nails  Unable to assess     Muscle depletion in LE's likely related to non-use of muscles. Per RN,  patient is paraplegic.   Diet Order:  No diet orders on file  EDUCATION NEEDS:   No education needs have been identified at this time  Skin:  Skin Assessment: Skin Integrity Issues: Skin Integrity Issues:: Other (Comment) Other: sacral decubitus ulcer  Last BM:  unknown  Height:   Ht Readings from Last 1 Encounters:  02/15/17 5' (1.524 m)    Weight:   Wt Readings from Last 1 Encounters:  02/15/17 136 lb 3.9 oz (61.8 kg)    Ideal Body Weight:  45.5 kg  BMI:  Body mass index is 26.61 kg/m.  Estimated Nutritional Needs:   Kcal:  1800  Protein:  100-120 gm  Fluid:  1.8-2 L   Joaquin CourtsKimberly Janessa Mickle, RD, LDN, CNSC Pager [phone] After Hours Pager [phone]

## 2017-02-15 NOTE — ED Notes (Signed)
MD paged RE pt's bp.  Pt remains alert and oriented.

## 2017-02-15 NOTE — ED Notes (Signed)
Date and time results received: 02/15/17 .now (use smartphrase ".now" to insert current time)  Test: Lactic acid Critical Value: 2.4  Name of Provider Notified: Dr. Toniann Failkakrakandy  Orders Received? Or Actions Taken?: see new orders

## 2017-02-15 NOTE — ED Notes (Signed)
CBG 106 

## 2017-02-15 NOTE — ED Notes (Signed)
CBG 296; RN notified 

## 2017-02-15 NOTE — Progress Notes (Signed)
ANTICOAGULATION CONSULT NOTE - Initial Consult  Pharmacy Consult for Lovenox Indication: pulmonary embolus  No Known Allergies  Patient Measurements: Height: 5' (152.4 cm) Weight: 131 lb (59.4 kg) IBW/kg (Calculated) : 45.5  Vital Signs: Temp: 98.3 F (36.8 C) (01/18 0306) Temp Source: Oral (01/18 0306) BP: 111/85 (01/18 0315) Pulse Rate: 104 (01/18 0245)  Labs: Recent Labs    02/14/17 2319  HGB 13.4  HCT 42.8  PLT 279  LABPROT 15.9*  INR 1.29  CREATININE 1.37*    Estimated Creatinine Clearance: 38.3 mL/min (A) (by C-G formula based on SCr of 1.37 mg/dL (H)).   Medical History: Past Medical History:  Diagnosis Date  . Acute pulmonary embolism (HCC) 02/19/2015  . Acute respiratory failure (HCC)   . Diabetes mellitus without complication (HCC)    Type 2, W/o complications  . DVT (deep venous thrombosis) (HCC) 04/05/2015  . Dysphagia   . Epilepsy (HCC)   . GERD (gastroesophageal reflux disease)   . Hyperlipidemia   . Hypertension   . IBS (irritable bowel syndrome)   . Nontraumatic subarachnoid hemorrhage (HCC)   . SAH (subarachnoid hemorrhage) (HCC)   . Urinary retention     Medications:  No current facility-administered medications on file prior to encounter.    Current Outpatient Medications on File Prior to Encounter  Medication Sig Dispense Refill  . acetaminophen (TYLENOL) 325 MG tablet Place 650 mg into feeding tube daily. And every 4 hours as needed for mild pain,fever    . Amino Acids-Protein Hydrolys (FEEDING SUPPLEMENT, PRO-STAT SUGAR FREE 64,) LIQD Place 60 mLs into feeding tube 3 (three) times daily with meals.     Marland Kitchen. amLODipine (NORVASC) 5 MG tablet Place 5 mg into feeding tube daily.     . bethanechol (URECHOLINE) 10 MG tablet 10 mg by PEG Tube route every 8 (eight) hours.     . cloNIDine (CATAPRES) 0.1 MG tablet Place 0.1 mg into feeding tube 2 (two) times daily.     Marland Kitchen. enoxaparin (LOVENOX) 60 MG/0.6ML injection Inject 60 mg into the skin 2 (two)  times daily.     Marland Kitchen. gabapentin (NEURONTIN) 100 MG capsule Take 300 mg by mouth 2 (two) times daily. Hold if Lathargic    . hydrocortisone cream 1 % Apply 1 application topically 3 (three) times daily. Apply to abdomen for rash    . levETIRAcetam (KEPPRA) 100 MG/ML solution Place 5 mLs (500 mg total) into feeding tube 2 (two) times daily. 473 mL 12  . metoprolol (LOPRESSOR) 100 MG tablet Place 100 mg into feeding tube 2 (two) times daily.    . metoprolol tartrate (LOPRESSOR) 25 MG tablet Place 25 mg into feeding tube 2 (two) times daily.    . Multiple Vitamins-Minerals (DECUBI-VITE) CAPS Place 1 capsule into feeding tube daily.     . Nutritional Supplements (FEEDING SUPPLEMENT, JEVITY 1.2 CAL,) LIQD 100 mls/hour, for 12 hours/day.  On at 8pm, off at 8am.  May substitute Jevity 1.5 for two cal until tow cal becomes available    . ondansetron (ZOFRAN) 4 MG tablet Take 4 mg by mouth daily.    Marland Kitchen. oxycodone (OXY-IR) 5 MG capsule Take 5 mg by mouth every 8 (eight) hours.    Marland Kitchen. oxyCODONE (ROXICODONE) 5 MG immediate release tablet Place 1 tablet (5 mg total) into feeding tube every 4 (four) hours as needed for severe pain. 100 tablet 0  . Pantoprazole Sodium POWD Give 1 packet (40 mg) via G-Tube one time a day for GERD    .  polyethylene glycol (MIRALAX / GLYCOLAX) packet Place 17 g into feeding tube daily.    . tizanidine (ZANAFLEX) 6 MG capsule Place 6 mg into feeding tube 3 (three) times daily. Hold if Sleepy     . UNABLE TO FIND HSG Puree diet - HSG Puree texture, Regular consistency, may have pleasure finger foods with supervision    . UNABLE TO FIND Enternal Feet Order - Give 200 ml if water via G-Tube every 4 hours    . WATER FOR INJECTION STERILE IJ Place 30-60 mLs into feeding tube. Flush with 30-60 ml water before and after meds, before initiating feedings or when there is an interruption of feeding to maintain patency.  Also flush every shift with 5-10 ml water between each medication        Assessment: 52 y.o. female admitted with fevers/sepsis, h/o PE, to continue Lovenox   Goal of Therapy:  Full anticoagulation with Lovenox Monitor platelets by anticoagulation protocol: Yes   Plan:  Lovenox 60 mg SQ q12h  Eddie Candle 02/15/2017,3:53 AM

## 2017-02-16 ENCOUNTER — Inpatient Hospital Stay (HOSPITAL_COMMUNITY): Payer: Medicaid Other

## 2017-02-16 DIAGNOSIS — R0602 Shortness of breath: Secondary | ICD-10-CM

## 2017-02-16 DIAGNOSIS — A419 Sepsis, unspecified organism: Principal | ICD-10-CM

## 2017-02-16 DIAGNOSIS — L8990 Pressure ulcer of unspecified site, unspecified stage: Secondary | ICD-10-CM

## 2017-02-16 DIAGNOSIS — R509 Fever, unspecified: Secondary | ICD-10-CM

## 2017-02-16 LAB — GLUCOSE, CAPILLARY
GLUCOSE-CAPILLARY: 178 mg/dL — AB (ref 65–99)
GLUCOSE-CAPILLARY: 135 mg/dL — AB (ref 65–99)
Glucose-Capillary: 123 mg/dL — ABNORMAL HIGH (ref 65–99)
Glucose-Capillary: 139 mg/dL — ABNORMAL HIGH (ref 65–99)
Glucose-Capillary: 151 mg/dL — ABNORMAL HIGH (ref 65–99)
Glucose-Capillary: 239 mg/dL — ABNORMAL HIGH (ref 65–99)
Glucose-Capillary: 98 mg/dL (ref 65–99)

## 2017-02-16 LAB — COMPREHENSIVE METABOLIC PANEL
ALT: 71 U/L — AB (ref 14–54)
AST: 83 U/L — AB (ref 15–41)
Albumin: 3.4 g/dL — ABNORMAL LOW (ref 3.5–5.0)
Alkaline Phosphatase: 196 U/L — ABNORMAL HIGH (ref 38–126)
Anion gap: 19 — ABNORMAL HIGH (ref 5–15)
BUN: 15 mg/dL (ref 6–20)
CO2: 21 mmol/L — AB (ref 22–32)
Calcium: 9.5 mg/dL (ref 8.9–10.3)
Chloride: 111 mmol/L (ref 101–111)
Creatinine, Ser: 0.88 mg/dL (ref 0.44–1.00)
GFR calc Af Amer: >60 mL/min (ref >60)
GFR calc non Af Amer: >60 mL/min (ref >60)
GLUCOSE: 124 mg/dL — AB (ref 65–99)
Potassium: 4.1 mmol/L (ref 3.5–5.1)
SODIUM: 151 mmol/L — AB (ref 135–145)
Total Bilirubin: 1.1 mg/dL (ref 0.3–1.2)
Total Protein: 8.4 g/dL — ABNORMAL HIGH (ref 6.5–8.1)

## 2017-02-16 LAB — PHOSPHORUS: PHOSPHORUS: 2.3 mg/dL — AB (ref 2.5–4.6)

## 2017-02-16 LAB — CBC WITH DIFFERENTIAL/PLATELET
BASOS PCT: 0 %
Basophils Absolute: 0 10*3/uL (ref 0.0–0.1)
EOS PCT: 0 %
Eosinophils Absolute: 0 10*3/uL (ref 0.0–0.7)
HCT: 41.1 % (ref 36.0–46.0)
Hemoglobin: 12.7 g/dL (ref 12.0–15.0)
LYMPHS ABS: 1.8 10*3/uL (ref 0.7–4.0)
Lymphocytes Relative: 7 %
MCH: 30.6 pg (ref 26.0–34.0)
MCHC: 30.9 g/dL (ref 30.0–36.0)
MCV: 99 fL (ref 78.0–100.0)
MONO ABS: 1.8 10*3/uL — AB (ref 0.1–1.0)
Monocytes Relative: 7 %
Neutro Abs: 21.9 10*3/uL — ABNORMAL HIGH (ref 1.7–7.7)
Neutrophils Relative %: 86 %
Platelets: 242 10*3/uL (ref 150–400)
RBC: 4.15 MIL/uL (ref 3.87–5.11)
RDW: 15 % (ref 11.5–15.5)
WBC: 25.5 10*3/uL — ABNORMAL HIGH (ref 4.0–10.5)

## 2017-02-16 LAB — LACTIC ACID, PLASMA
LACTIC ACID, VENOUS: 4.6 mmol/L — AB (ref 0.5–1.9)
Lactic Acid, Venous: 1.6 mmol/L (ref 0.5–1.9)

## 2017-02-16 MED ORDER — CHLORHEXIDINE GLUCONATE 0.12 % MT SOLN
15.0000 mL | Freq: Two times a day (BID) | OROMUCOSAL | Status: DC
  Administered 2017-02-16 – 2017-02-24 (×15): 15 mL via OROMUCOSAL
  Filled 2017-02-16 (×13): qty 15

## 2017-02-16 MED ORDER — METOPROLOL TARTRATE 5 MG/5ML IV SOLN
2.5000 mg | INTRAVENOUS | Status: DC | PRN
  Administered 2017-02-16: 2.5 mg via INTRAVENOUS
  Administered 2017-02-16: 5 mg via INTRAVENOUS
  Filled 2017-02-16 (×2): qty 5

## 2017-02-16 MED ORDER — FUROSEMIDE 10 MG/ML IJ SOLN
40.0000 mg | Freq: Once | INTRAMUSCULAR | Status: AC
  Administered 2017-02-16: 40 mg via INTRAVENOUS
  Filled 2017-02-16: qty 4

## 2017-02-16 MED ORDER — LACTATED RINGERS IV SOLN
INTRAVENOUS | Status: DC
  Administered 2017-02-16 (×2): via INTRAVENOUS

## 2017-02-16 MED ORDER — DEXTROSE 5 % IV SOLN: INTRAVENOUS | Status: DC

## 2017-02-16 MED ORDER — POTASSIUM CHLORIDE 20 MEQ/15ML (10%) PO SOLN
40.0000 meq | Freq: Every day | ORAL | Status: DC
  Filled 2017-02-16: qty 30

## 2017-02-16 MED ORDER — VITAL 1.5 CAL PO LIQD
1000.0000 mL | ORAL | Status: DC
  Administered 2017-02-16 – 2017-02-19 (×3): 1000 mL
  Filled 2017-02-16 (×5): qty 1000

## 2017-02-16 MED ORDER — POTASSIUM CHLORIDE 20 MEQ/15ML (10%) PO SOLN
40.0000 meq | Freq: Once | ORAL | Status: AC
  Administered 2017-02-16: 40 meq
  Filled 2017-02-16: qty 30

## 2017-02-16 MED ORDER — ORAL CARE MOUTH RINSE
15.0000 mL | Freq: Two times a day (BID) | OROMUCOSAL | Status: DC
  Administered 2017-02-17 – 2017-02-23 (×10): 15 mL via OROMUCOSAL

## 2017-02-16 MED ORDER — FUROSEMIDE 10 MG/ML IJ SOLN
60.0000 mg | Freq: Once | INTRAMUSCULAR | Status: AC
  Administered 2017-02-16: 60 mg via INTRAVENOUS
  Filled 2017-02-16: qty 6

## 2017-02-16 MED ORDER — VITAL HIGH PROTEIN PO LIQD
1000.0000 mL | ORAL | Status: DC
  Administered 2017-02-16: 1000 mL

## 2017-02-16 NOTE — Progress Notes (Signed)
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 She has a history of PE, DMII, DVT, epilepsy, DVT, HTN, IBS, and subarachnoid hemorrhage. The patient is aphasic at baseline, with a sacral decubitus ulcer, but denying nausea, vomiting, chest pain, dyspnea, abdominal pain, muscle aches or visual changes. She is dependent on PEG tube feeding and has decreased PO intake. Patient attested to a loose watery stool the prior day as well as intermittent abdominal pain for the past several weeks. She was progressing well at her facility as per charting review until it was noted that she was febrile to 103.   The patient admitted for sepsis of unclear etiology with evaluation and treatment initiated. Sepsis protocol was began promptly on admission with blood cultures, antibiotics and fluid resuscitation. Prior CXR clear without evidence of aspiration. UA unremarkable, blood cultures pending.   PAST MEDICAL HISTORY :  She  has a past medical history of Acute pulmonary embolism (HCC) (02/19/2015), Acute respiratory failure (HCC), Diabetes mellitus without complication (HCC), DVT (deep venous thrombosis) (HCC) (04/05/2015), Dysphagia, Epilepsy (HCC), GERD (gastroesophageal reflux disease), Hyperlipidemia, Hypertension, IBS (irritable bowel syndrome), Nontraumatic subarachnoid hemorrhage (HCC), SAH (subarachnoid hemorrhage) (HCC), and Urinary retention.  PAST SURGICAL HISTORY: She  has a past surgical history that includes Radiology with anesthesia (N/A, 01/13/2015); Esophagogastroduodenoscopy  (egd) with propofol (N/A, 02/02/2015); PEG placement (N/A, 02/02/2015); Abdominal surgery; Aneurysm coiling; Tracheostomy; Colonoscopy (N/A, 02/20/2015); ir generic historical (08/26/2015); ir generic historical (09/14/2015); IR REPLACE G-TUBE SIMPLE WO FLUORO (06/08/2016); IR REPLACE G-TUBE SIMPLE WO FLUORO (10/24/2016); and IR REPLACE G-TUBE SIMPLE WO FLUORO (01/07/2017).  No Known Allergies  No current facility-administered medications on file prior to encounter.    Current Outpatient Medications on File Prior to Encounter  Medication Sig  . acetaminophen (TYLENOL) 325 MG tablet Place 650 mg into feeding tube daily. And every 4 hours as needed for mild pain,fever  . amLODipine (NORVASC) 5 MG tablet Place 5 mg into feeding tube daily.   . bethanechol (URECHOLINE) 10 MG tablet 10 mg by PEG Tube route every 8 (eight) hours.   . cloNIDine (CATAPRES) 0.1 MG tablet Place 0.1 mg into feeding tube 2 (two) times daily.   Marland Kitchen. gabapentin (NEURONTIN) 100 MG capsule Take 300 mg by mouth 2 (two) times daily. Hold if Lathargic  . hydrocortisone cream 1 % Apply 1 application topically 3 (three) times daily. Apply to abdomen for rash  . levETIRAcetam (KEPPRA) 100 MG/ML solution Place 5 mLs (500 mg total) into feeding tube 2 (two) times daily.  . Multiple Vitamins-Minerals (DECUBI-VITE) CAPS Place 1 capsule into feeding tube daily.   . Amino Acids-Protein Hydrolys (FEEDING SUPPLEMENT, PRO-STAT SUGAR FREE 64,) LIQD Place 60 mLs into feeding tube 3 (three) times daily with meals.   . enoxaparin (LOVENOX) 60 MG/0.6ML injection Inject 60 mg into the skin 2 (two) times daily.   . metoprolol (LOPRESSOR) 100 MG tablet Place 100 mg into feeding tube 2 (two) times daily.  . metoprolol tartrate (LOPRESSOR) 25 MG tablet Place 25 mg into feeding tube 2 (two) times daily.  . Nutritional Supplements (FEEDING SUPPLEMENT, JEVITY  1.2 CAL,) LIQD 100 mls/hour, for 12 hours/day.  On at 8pm, off at 8am.  May substitute Jevity 1.5 for two cal  until tow cal becomes available  . ondansetron (ZOFRAN) 4 MG tablet Take 4 mg by mouth daily.  Marland Kitchen oxycodone (OXY-IR) 5 MG capsule Take 5 mg by mouth every 8 (eight) hours.  Marland Kitchen oxyCODONE (ROXICODONE) 5 MG immediate release tablet Place 1 tablet (5 mg total) into feeding tube every 4 (four) hours as needed for severe pain.  . Pantoprazole Sodium POWD Give 1 packet (40 mg) via G-Tube one time a day for GERD  . polyethylene glycol (MIRALAX / GLYCOLAX) packet Place 17 g into feeding tube daily.  . tizanidine (ZANAFLEX) 6 MG capsule Place 6 mg into feeding tube 3 (three) times daily. Hold if Sleepy   . UNABLE TO FIND HSG Puree diet - HSG Puree texture, Regular consistency, may have pleasure finger foods with supervision  . UNABLE TO FIND Enternal Feet Order - Give 200 ml if water via G-Tube every 4 hours  . WATER FOR INJECTION STERILE IJ Place 30-60 mLs into feeding tube. Flush with 30-60 ml water before and after meds, before initiating feedings or when there is an interruption of feeding to maintain patency.  Also flush every shift with 5-10 ml water between each medication    FAMILY HISTORY:  Patient unable to provide FHx at this time due to medical condition.   SOCIAL HISTORY: She  reports that she has quit smoking. she has never used smokeless tobacco. She reports that she does not drink alcohol or use drugs.  REVIEW OF SYSTEMS:   Negative except as per HPI.  SUBJECTIVE:  Transferred to ICU after rapid response, monitoring for intubation need She has remained stable overnight.  LA elevated today.  VITAL SIGNS: BP 120/69   Pulse (!) 149   Temp 99 F (37.2 C) (Oral)   Resp 17   Ht 5' (1.524 m)   Wt 129 lb 6.6 oz (58.7 kg)   LMP  (LMP Unknown)   SpO2 95%   BMI 25.27 kg/m   HEMODYNAMICS:   VENTILATOR SETTINGS:   INTAKE / OUTPUT: I/O last 3 completed shifts: In: 5522.9 [I.V.:2422.9; IV Piggyback:3100] Out: 1850 [Urine:1850]  PHYSICAL EXAMINATION: Gen:      No acute distress,  comfortable HEENT:  EOMI, sclera anicteric Neck:     No masses; no thyromegaly Lungs:    Clear to auscultation bilaterally; normal respiratory effort CV:         Regular rate and rhythm; no murmurs Abd:      + bowel sounds; soft, non-tender; no palpable masses, no distension Ext:    No edema; adequate peripheral perfusion Skin:      Warm and dry; no rash Neuro: alert and oriented x 3 Psych: normal mood and affect  LABS:  BMET Recent Labs  Lab 02/15/17 0342 02/15/17 1217 02/16/17 0614  NA 154* 150* 151*  K 4.0 3.2* 4.1  CL 118* 119* 111  CO2 22 22 21*  BUN 32* 21* 15  CREATININE 1.06* 0.86 0.88  GLUCOSE 105* 306* 124*    Electrolytes Recent Labs  Lab 02/15/17 0342 02/15/17 1217 02/16/17 0614  CALCIUM 8.6* 8.6* 9.5  PHOS  --   --  2.3*    CBC Recent Labs  Lab 02/14/17 2319 02/15/17 0342 02/16/17 0614  WBC 33.7* 32.5* 25.5*  HGB 13.4 11.1* 12.7  HCT 42.8 37.3 41.1  PLT 279 216 242    Coag's  Recent Labs  Lab 02/14/17 2319 02/15/17 0342  APTT  --  25  INR 1.29 1.35    Sepsis Markers Recent Labs  Lab 02/15/17 0342 02/15/17 0821 02/16/17 0828  LATICACIDVEN 2.4* 2.0* 4.6*  PROCALCITON 0.29  --   --     ABG No results for input(s): PHART, PCO2ART, PO2ART in the last 168 hours.  Liver Enzymes Recent Labs  Lab 02/14/17 2319 02/15/17 0342 02/16/17 0614  AST 26 27 83*  ALT 38 33 71*  ALKPHOS 112 102 196*  BILITOT 0.2* 0.7 1.1  ALBUMIN 3.4* 3.0* 3.4*    Cardiac Enzymes No results for input(s): TROPONINI, PROBNP in the last 168 hours.  Glucose Recent Labs  Lab 02/15/17 1632 02/15/17 2029 02/16/17 0040 02/16/17 0417 02/16/17 0551 02/16/17 0845  GLUCAP 313* 85 239* 178* 151* 98    Imaging Dg Chest Port 1 View  Result Date: 02/16/2017 CLINICAL DATA:  52 year old female with shortness of breath. EXAM: PORTABLE CHEST 1 VIEW COMPARISON:  Chest radiograph dated 02/14/2017 FINDINGS: The patient is rotated. Slight increased density in the  right upper lobe, likely related to atelectatic changes. There is no focal consolidation, pleural effusion, or pneumothorax. Evaluation of the lung apices is limited due to superimposition of the patient's mandible stable cardiac silhouette. No acute osseous pathology. IMPRESSION: No focal consolidation. Probable atelectatic changes of the right upper lobe. Electronically Signed   By: Elgie CollardArash  Radparvar M.D.   On: 02/16/2017 03:17   STUDIES:   CULTURES: Blood Cx x2 01/18 >>> C. Diff ordered01/19>>>  ANTIBIOTICS: Vancomycin 1/18>>> Zosyn 1/18>>>  SIGNIFICANT EVENTS: Transferred to ICU.  LINES/TUBES: Gastrostomy tube 12/10>>> PIV  DISCUSSION: [age] yoF who was transferred to the ICU due to airway compromise. Patient is unable to maintain her oral/pulmonary secretions without repeated suctioning. She is otherwise stable but may require urgent intubation.  ASSESSMENT / PLAN:  PULMONARY A: Increased oral secretions requiring frequent suction On 2L [place], SpO2 >100% P:   Continue supplemental o2. Keep in ICU overnight for monitoring  CARDIOVASCULAR A:  HTN History of PE and DVT, on home Lovenox  Patient given furosemide now LA is higher P:  Continue amlodipine, metoprolol, clonidine as indicated for HTN Restart gentle fluids.  RENAL A:   Cr 0.86,  Hypernatremia 150 Hypokalemia 3.2 Hyperchloremia 119 P:   CMP order active  GASTROINTESTINAL A:   Patient on tube feeds pro-stat sugar free at 760ml/hr Patient NPO at this time P:   GI ppx with Protonix 40mg   HEMATOLOGIC A:   Anemia with Hgb 11.1 MCV slightly elevated to 100.3 P:  APTT down to 25 Protime/INR 16.5/1.35 Monitor CBC daily  INFECTIOUS A:   Sepsos, Suspected aspiration Elevated LA Previously treated for C diff on prior hospitalization,  P:   Continue antibiotics Follow cultures Restart gentle fluids. LR at 100cc/hr. Follow LA Hold further diuresis  ENDOCRINE A:   Glucose elevated to 306   Hgb A1c  6.1% No home antihyperglycemic medications listed in chart P:   SSI coverage  NEUROLOGIC A:   Epilepsy on Keppra without occurrence noted on Keppra Patient on Gabapentin for peripheral neuropathy  P:   Continue Keppra  RASS goal: Pain control with Oxycodone 5mg  per tube q4 PRN  FAMILY  - Updates:  - Inter-disciplinary family meet or Palliative Care meeting due by:  day 7  The patient is critically ill with multiple organ system failure and requires high complexity decision making for assessment and support, frequent evaluation and titration of therapies,  advanced monitoring, review of radiographic studies and interpretation of complex data.   Critical Care Time devoted to patient care services, exclusive of separately billable procedures, described in this note is 35 minutes.   Chilton Greathouse MD [facility] Pager [phone] If no answer or after 3pm call: [phone] 02/16/2017, 11:31 AM

## 2017-02-16 NOTE — H&P (Signed)
PULMONARY / CRITICAL CARE MEDICINE   Name: Rachel Vang MRN: [id number] DOB: 03/11/1965    ADMISSION DATE:  02/14/2017 CONSULTATION DATE:  02/16/2017  REFERRING MD:  [facility]   CHIEF COMPLAINT: respiratory distress  HISTORY OF PRESENT ILLNESS:   Rachel Vang is a 52 yoF with history of debilitating hemorrhagic CVA and subarachnoid hemorrhage with chronic right sided hemiparesis and dysphagia who was noted to be febrile at her living facility to 103F. She has a history of PE, DMII, DVT, epilepsy, DVT, HTN, IBS, and subarachnoid hemorrhage. The patient is aphasic at baseline, with a sacral decubitus ulcer, but denying nausea, vomiting, chest pain, dyspnea, abdominal pain, muscle aches or visual changes. She is dependent on PEG tube feeding and has decreased PO intake. Patient attested to a loose watery stool the prior day as well as intermittent abdominal pain for the past several weeks. She was progressing well at her facility as per charting review until it was noted that she was febrile to 103.   The patient admitted for sepsis of unclear etiology with evaluation and treatment initiated. Sepsis protocol was began promptly on admission with blood cultures, antibiotics and fluid resuscitation. Prior CXR clear without evidence of aspiration. UA unremarkable, blood cultures pending.   PAST MEDICAL HISTORY :  She  has a past medical history of Acute pulmonary embolism (HCC) (02/19/2015), Acute respiratory failure (HCC), Diabetes mellitus without complication (HCC), DVT (deep venous thrombosis) (HCC) (04/05/2015), Dysphagia, Epilepsy (HCC), GERD (gastroesophageal reflux disease), Hyperlipidemia, Hypertension, IBS (irritable bowel syndrome), Nontraumatic subarachnoid hemorrhage (HCC), SAH (subarachnoid hemorrhage) (HCC), and Urinary retention.  PAST SURGICAL HISTORY: She  has a past surgical history that includes Radiology with anesthesia (N/A, 01/13/2015); Esophagogastroduodenoscopy  (egd) with propofol (N/A, 02/02/2015); PEG placement (N/A, 02/02/2015); Abdominal surgery; Aneurysm coiling; Tracheostomy; Colonoscopy (N/A, 02/20/2015); ir generic historical (08/26/2015); ir generic historical (09/14/2015); IR REPLACE G-TUBE SIMPLE WO FLUORO (06/08/2016); IR REPLACE G-TUBE SIMPLE WO FLUORO (10/24/2016); and IR REPLACE G-TUBE SIMPLE WO FLUORO (01/07/2017).  No Known Allergies  No current facility-administered medications on file prior to encounter.    Current Outpatient Medications on File Prior to Encounter  Medication Sig  . acetaminophen (TYLENOL) 325 MG tablet Place 650 mg into feeding tube daily. And every 4 hours as needed for mild pain,fever  . amLODipine (NORVASC) 5 MG tablet Place 5 mg into feeding tube daily.   . bethanechol (URECHOLINE) 10 MG tablet 10 mg by PEG Tube route every 8 (eight) hours.   . cloNIDine (CATAPRES) 0.1 MG tablet Place 0.1 mg into feeding tube 2 (two) times daily.   Marland Kitchen. gabapentin (NEURONTIN) 100 MG capsule Take 300 mg by mouth 2 (two) times daily. Hold if Lathargic  . hydrocortisone cream 1 % Apply 1 application topically 3 (three) times daily. Apply to abdomen for rash  . levETIRAcetam (KEPPRA) 100 MG/ML solution Place 5 mLs (500 mg total) into feeding tube 2 (two) times daily.  . Multiple Vitamins-Minerals (DECUBI-VITE) CAPS Place 1 capsule into feeding tube daily.   . Amino Acids-Protein Hydrolys (FEEDING SUPPLEMENT, PRO-STAT SUGAR FREE 64,) LIQD Place 60 mLs into feeding tube 3 (three) times daily with meals.   . enoxaparin (LOVENOX) 60 MG/0.6ML injection Inject 60 mg into the skin 2 (two) times daily.   . metoprolol (LOPRESSOR) 100 MG tablet Place 100 mg into feeding tube 2 (two) times daily.  . metoprolol tartrate (LOPRESSOR) 25 MG tablet Place 25 mg into feeding tube 2 (two) times daily.  . Nutritional Supplements (FEEDING SUPPLEMENT, JEVITY  1.2 CAL,) LIQD 100 mls/hour, for 12 hours/day.  On at 8pm, off at 8am.  May substitute Jevity 1.5 for two cal  until tow cal becomes available  . ondansetron (ZOFRAN) 4 MG tablet Take 4 mg by mouth daily.  Marland Kitchen oxycodone (OXY-IR) 5 MG capsule Take 5 mg by mouth every 8 (eight) hours.  Marland Kitchen oxyCODONE (ROXICODONE) 5 MG immediate release tablet Place 1 tablet (5 mg total) into feeding tube every 4 (four) hours as needed for severe pain.  . Pantoprazole Sodium POWD Give 1 packet (40 mg) via G-Tube one time a day for GERD  . polyethylene glycol (MIRALAX / GLYCOLAX) packet Place 17 g into feeding tube daily.  . tizanidine (ZANAFLEX) 6 MG capsule Place 6 mg into feeding tube 3 (three) times daily. Hold if Sleepy   . UNABLE TO FIND HSG Puree diet - HSG Puree texture, Regular consistency, may have pleasure finger foods with supervision  . UNABLE TO FIND Enternal Feet Order - Give 200 ml if water via G-Tube every 4 hours  . WATER FOR INJECTION STERILE IJ Place 30-60 mLs into feeding tube. Flush with 30-60 ml water before and after meds, before initiating feedings or when there is an interruption of feeding to maintain patency.  Also flush every shift with 5-10 ml water between each medication    FAMILY HISTORY:  Patient unable to provide FHx at this time due to medical condition.   SOCIAL HISTORY: She  reports that she has quit smoking. she has never used smokeless tobacco. She reports that she does not drink alcohol or use drugs.  REVIEW OF SYSTEMS:   Negative except as per HPI.  SUBJECTIVE:   VITAL SIGNS: BP 124/75   Pulse 99   Temp 98.7 F (37.1 C) (Oral)   Resp (!) 21   Ht 5' (1.524 m)   Wt 136 lb 3.9 oz (61.8 kg)   LMP  (LMP Unknown)   SpO2 94%   BMI 26.61 kg/m   HEMODYNAMICS:   VENTILATOR SETTINGS:   INTAKE / OUTPUT: I/O last 3 completed shifts: In: 4250 [I.V.:1450; IV Piggyback:2800] Out: 1300 [Urine:1300]  PHYSICAL EXAMINATION: General:  Patient is lying comfortably in her bed, in no acute distress, not diaphoretic  Neuro:  Alert and oriented to name, location, and events leading to her  hospitalization HEENT:  PERRL, EOM intact, mucus membranes moist Cardiovascular:  S1-2 clear to auscultation bilaterally, tachycardic, no murmur auscultated  Lungs:  Mild bilateral wheezing upper and lower lung fields, no stridor or rhonchi  Abdomen:  Soft, nontender, nondistended Musculoskeletal:  No pedal edema, dorsalis pedis pulses intact bilaterally  Skin:  Warm, no rashes present  LABS:  BMET Recent Labs  Lab 02/14/17 2319 02/15/17 0342 02/15/17 1217  NA 155* 154* 150*  K 3.6 4.0 3.2*  CL 116* 118* 119*  CO2 26 22 22   BUN 41* 32* 21*  CREATININE 1.37* 1.06* 0.86  GLUCOSE 210* 105* 306*    Electrolytes Recent Labs  Lab 02/14/17 2319 02/15/17 0342 02/15/17 1217  CALCIUM 9.9 8.6* 8.6*    CBC Recent Labs  Lab 02/14/17 02/14/17 2319 02/15/17 0342  WBC 28.0 33.7* 32.5*  HGB 13.9 13.4 11.1*  HCT 41 42.8 37.3  PLT 297 279 216    Coag's Recent Labs  Lab 02/14/17 2319 02/15/17 0342  APTT  --  25  INR 1.29 1.35    Sepsis Markers Recent Labs  Lab 02/15/17 0133 02/15/17 0342 02/15/17 0821  LATICACIDVEN 2.10* 2.4* 2.0*  PROCALCITON  --  0.29  --     ABG No results for input(s): PHART, PCO2ART, PO2ART in the last 168 hours.  Liver Enzymes Recent Labs  Lab 02/14/17 02/14/17 2319 03-14-2017 0342  AST 22 26 27   ALT 38* 38 33  ALKPHOS 133* 112 102  BILITOT  --  0.2* 0.7  ALBUMIN  --  3.4* 3.0*    Cardiac Enzymes No results for input(s): TROPONINI, PROBNP in the last 168 hours.  Glucose Recent Labs  Lab March 14, 2017 0445 Mar 14, 2017 0834 14-Mar-2017 1205 03-14-2017 1632 2017-03-14 2029 02/16/17 0040  GLUCAP 118* 106* 296* 313* 85 239*    Imaging Dg Chest Port 1 View  Result Date: 02/16/2017 CLINICAL DATA:  52 year old female with shortness of breath. EXAM: PORTABLE CHEST 1 VIEW COMPARISON:  Chest radiograph dated 02/14/2017 FINDINGS: The patient is rotated. Slight increased density in the right upper lobe, likely related to atelectatic changes. There  is no focal consolidation, pleural effusion, or pneumothorax. Evaluation of the lung apices is limited due to superimposition of the patient's mandible stable cardiac silhouette. No acute osseous pathology. IMPRESSION: No focal consolidation. Probable atelectatic changes of the right upper lobe. Electronically Signed   By: Elgie Collard M.D.   On: 02/16/2017 03:17   STUDIES:   CULTURES: Blood Cx x2 03/14/22 >>> C. Diff ordered01/19>>>  ANTIBIOTICS: Vancomycin 03-14-2022>>> Zosyn 1/18>>>  SIGNIFICANT EVENTS: Transferred to ICU.  LINES/TUBES: Gastrostomy tube 12/10>>> PIV  DISCUSSION: 40 yoF who was transferred to the ICU due to airway compromise. Patient is unable to maintain her oral/pulmonary secretions without repeated suctioning. She is otherwise stable but may require urgent intubation.  ASSESSMENT / PLAN:  PULMONARY A: Increased oral secretions requiring frequent suction On 2L [place], SpO2 >100% P:   Continue frequent oral suction PRN Continue to monitor SpO2  CARDIOVASCULAR A:  HTN History of PE and DVT, on home Lovenox  Patient given furosemide  P:  Continue amlodipine, metoprolol, clonidine as indicated for HTN  RENAL A:   Cr 0.86,  Hypernatremia 150 Hypokalemia 3.2 Hyperchloremia 119 P:   CMP order active Fluids held at this time Potassium per tube order repeated x 2  Potassium daily per tube for hypokalemia  Repeat BMP daily  GASTROINTESTINAL A:   Patient on tube feeds pro-stat sugar free at 84ml/hr Patient NPO at this time P:   GI ppx with Protonix 40mg   HEMATOLOGIC A:   Anemia with Hgb 11.1 MCV slightly elevated to 100.3 P:  APTT down to 25 Protime/INR 16.5/1.35 Monitor CBC daily  INFECTIOUS A:   Initial Lactic acid 2.4 cleared to 2.0 Initial WBC 33.7, remained elevated at 32.5  Patient febrile to 103F at facility, currently afebrile, tachypneic with HR >90, BP WNL's  Stage one pressure ulcer, sacrum No open lesions or signs of  cellulitis CXR-absent focal consolidation, pleural effusion or mass observed. UA with trace leukocytes, rare bacteria  No clear etiology at this time Previously treated for C diff on prior hospitalization,  P:   Sepsis protocol was followed on admission Repeat CBC ordered this am Continue antibiotics and track culture as above Urine culture   C diff pending Repeat CBC daily  ENDOCRINE A:   Glucose elevated to 306   Hgb A1c 6.1% No home antihyperglycemic medications listed in chart P:   SSI sensitive with Q4 hour CBG  No basal at this time  NEUROLOGIC A:   Epilepsy on Keppra without occurrence noted on Keppra Patient on Gabapentin for peripheral neuropathy  P:   Continue Keppra  RASS goal: Pain control with Oxycodone 5mg  per tube q4 PRN  FAMILY  - Updates:   - Inter-disciplinary family meet or Palliative Care meeting due by:  day 7  Lanelle Bal, MD Resident with [facility]  Pulmonary and Critical Care Medicine [facility] Pager: [phone]  02/16/2017, 4:00 AM

## 2017-02-16 NOTE — Progress Notes (Signed)
Called to pt's room to assist with NTS, Rapid response. Upon arrival, pt on 100% NRB, with SpO2 in 80's, RR 50's, and tachycardic with crackles heard bilaterally. Pt NTS'd by RT returning mod amount of white/frothy secretions. Pt tolerated NTS. Pt encouraged to cough, producing small amount of thick/clear/white secretions. Post NTS, Pt appeared more comfortable, SpO2 around 95%. Pt then weaned to 10L HFNC. Pt transferred to 2M13.

## 2017-02-16 NOTE — Progress Notes (Signed)
eLink Physician-Brief Progress Note Patient Name: Rachel MinksStephanie N Vang DOB: 04/02/1965 MRN: [id number]   Date of Service  02/16/2017  HPI/Events of Note  Patient has orders for D5W and LR IV fluid. Blood glucose = 123 and Na+ = 151.   eICU Interventions  Will order: 1. D/C D5W IV fluid. 2. Continue LR IV fluid.      Intervention Category Major Interventions: Hyperglycemia - active titration of insulin therapy;Electrolyte abnormality - evaluation and management  Lenell AntuSommer,Panhia Karl Eugene 02/16/2017, 8:26 PM

## 2017-02-16 NOTE — Progress Notes (Signed)
Pt temp 102.3 Rectal. RN notified.

## 2017-02-16 NOTE — Progress Notes (Signed)
Nutrition Follow-up  DOCUMENTATION CODES:  Not applicable  INTERVENTION:  Initiate TF via PEG with Vital 1.5 at goal rate of 20 ml/h (480 ml per day) and Prostat 60 ml TID to provide 1320 kcals,122 gm protein, 367 ml free water daily.  This will meet protein needs, but 73% kcal needs.   NUTRITION DIAGNOSIS:  Inadequate oral intake related to inability to eat as evidenced by NPO status.  GOAL:  Patient will meet greater than or equal to 90% of their needs  MONITOR:  TF tolerance, I & O's, Skin, Diet advancement  REASON FOR ASSESSMENT:  Consult Enteral/tube feeding initiation and management  ASSESSMENT:  52 yo female with PMH of ICH, CVA, HTN, sacral decubitus, seizures, PEG, who was admitted on 1/17 with tachycardia, fever, chills. Work-up ongoing.  RD consulted for initiation of tube feeding.   MD feels not safe to feed pt, but patient is insistent that she receives TF.Will start "very slowly" per MD.   Patient has aphasia at baseline. Pt nods when RD asks if she is nauseated, but not certain she is aware of question. RN reports patient has said the year is [id number].   Pt turns herself in bed and tends to curl and put head on railing. RN needs to frequently reposition to keep head elevated.   Currently on Vital High Protein per Tube feeding protocol. Will not implement home regimen as fiber contraindicated in criticall ill patients. Will change to much lower fiber, protein/kcal dense formula to better meet needs w/ less volume.   She feels her current weight is much higher than UBW. Per chart, patient appears to have been gaining weight over the past couple years.   Labs: Na: 151, BGs: 98-151, WBC:25.5, Phos: 2.3, Lactic Acid 4.6 Meds: Miralax, PPI, MVI, KCL, IVF, IV ABx,   Recent Labs  Lab 02/15/17 0342 02/15/17 1217 02/16/17 0614  NA 154* 150* 151*  K 4.0 3.2* 4.1  CL 118* 119* 111  CO2 22 22 21*  BUN 32* 21* 15  CREATININE 1.06* 0.86 0.88  CALCIUM 8.6* 8.6* 9.5   PHOS  --   --  2.3*  GLUCOSE 105* 306* 124*   Diet Order:  Diet NPO time specified  EDUCATION NEEDS:  No education needs have been identified at this time  Skin: Skin tear Stage II -Sacrum  Last BM:  1/19  Height:  Ht Readings from Last 1 Encounters:  02/15/17 5' (1.524 m)   Weight:  Wt Readings from Last 1 Encounters:  02/16/17 129 lb 6.6 oz (58.7 kg)   Wt Readings from Last 10 Encounters:  02/16/17 129 lb 6.6 oz (58.7 kg)  02/14/17 131 lb (59.4 kg)  01/24/17 127 lb 1.6 oz (57.7 kg)  01/06/17 128 lb (58.1 kg)  12/26/16 127 lb 6.4 oz (57.8 kg)  11/29/16 122 lb (55.3 kg)  11/26/16 122 lb (55.3 kg)  10/18/16 115 lb (52.2 kg)  10/27/16 115 lb (52.2 kg)  10/17/16 115 lb (52.2 kg)   Ideal Body Weight:  45.5 kg  BMI:  Body mass index is 25.27 kg/m.  Estimated Nutritional Needs:  Kcal:  1800 (31 kcal/kg bw) Protein:  100-120 gm pro (1.7-2 g/kg bw) Fluid:  1.8-2 L  Christophe LouisNathan Kathreen Dileo RD, LDN, CNSC Clinical Nutrition Pager: [phone] 02/16/2017 1:08 PM

## 2017-02-16 NOTE — Progress Notes (Signed)
Hospitalist progress note   Lucienne MinksStephanie N Minnifield  NUU:[id number] DOB: 03/27/1965 DOA: 02/14/2017 PCP: Patient, No Pcp Per   Specialists:   Brief Narrative:   51.fem hypertension, diabetes mellitus type 2 intracranial bleed/SAH status post coiling 12/2014, PE left lower extremity DVT 01/2015, trauma chronic trach, prior C. difficile, chronic pain on fentanyl, prior pressure ulcers on sacrum and heel, prior GI bleed 02/20/2015 diverticulosis, internal hemorrhoids  Baseline is able to verbalize but is dependant for ADLS found to have fever chills lactate elevated temperature 103 WBC 30 empiric antibiotic started-hypernatremic 154 BUN/creatinine 41/1.3 baseline 29/0.6  Assessment & Plan:   Assessment:  The primary encounter diagnosis was Fever, unspecified fever cause. Diagnoses of Sepsis, due to unspecified organism (HCC) and SOB (shortness of breath) were also pertinent to this visit.  Sepsis etiology possible subacute aspirationn-continue broad-spectrum vancomycin/Zosyn, follow blood culture urine culture-no skin deficit--as she is not having diarrhea I have cancelled CDIFF order Acute hypoxic respiratory failure-likely aspiration event 1/18-Rpt CXR no findings-some mild crackles today-detailed conversations with her-no aggressive measures beyond initial trial of intubation--I have mentioned to her at this stage I do not think it is safe to feed her via Tube given risk aspiration-as she is insistent however on having feed will start very slowly and keep HOB at 45 degrees and keep on SDU/ICU to see if decompensates Hypernatremia-0.45 NS--> change to D5W 75 cc/h--labs am Diabetes mellitus type 2-history of not apparently on any meds monitor-continue gabapentin 300 3 times daily Hypertension continue amlodipine 5, metoprolol 25 twice daily, clonidine 0.1 twice daily, not hypotensive currently Tachy-wasn't given entire dose of metoprolol this am-will give the same and monitor HR--looks like Sinus tach on  monitor Prior intracranial bleed 12/2014 resulting in aphasia and PEG tube-monitor mentation would hold OxyIR oxycodone, tizanidine and other meds with potential for confusion-would continue Keppra 5 mils twice daily Reflux continue pantoprazole 40 mg per tube   DVT prophylaxis: Lovenoex  Code Status:   Partial-ok for intubation-no CPR, NO trach   Family Communication:   D/w sone at bedside Disposition Plan:  Inpatient SDU, 2-3 days at least   Consultants:    none  Procedures:   none  Antimicrobials:    Vanc  Zosyn from 1/18   Subjective:  More awake and alert events overnight noted Patient asking for feeds-no cp No cough Looks better than yesterday am when I last saw her No loose stool, no chills  Objective: Vitals:   02/16/17 0645 02/16/17 0700 02/16/17 0800 02/16/17 0843  BP: 124/81 114/67 104/77   Pulse:  (!) 129 (!) 150   Resp: 19 14 15    Temp:    99 F (37.2 C)  TempSrc:    Oral  SpO2:  100% 100%   Weight:      Height:        Intake/Output Summary (Last 24 hours) at 02/16/2017 0905 Last data filed at 02/16/2017 0800 Gross per 24 hour  Intake 1772.91 ml  Output 1600 ml  Net 172.91 ml   Filed Weights   02/15/17 1347 02/15/17 1645 02/16/17 0500  Weight: 61.8 kg (136 lb 3.9 oz) 61.8 kg (136 lb 3.9 oz) 58.7 kg (129 lb 6.6 oz)    Examination:  Chr ill appearing female looks about stated age eomi ncat no pallr Chest mild crackles bilat post s1 s 2 slight tachy, no M rrr abd soft no rebound no guard Sacrum clean with no decubiti-heels wnln  Data Reviewed: I have personally reviewed following labs and imaging  studies  CBC: Recent Labs  Lab 02/14/17 02/14/17 2319 02/15/17 0342 02/16/17 0614  WBC 28.0 33.7* 32.5* 25.5*  NEUTROABS 23 28.0* 25.0* 21.9*  HGB 13.9 13.4 11.1* 12.7  HCT 41 42.8 37.3 41.1  MCV  --  99.5 100.3* 99.0  PLT 297 279 216 242   Basic Metabolic Panel: Recent Labs  Lab 02/14/17 02/14/17 2319 02/15/17 0342 02/15/17 1217  02/16/17 0614  NA 156* 155* 154* 150* 151*  K 4.1 3.6 4.0 3.2* 4.1  CL  --  116* 118* 119* 111  CO2  --  26 22 22  21*  GLUCOSE  --  210* 105* 306* 124*  BUN 34* 41* 32* 21* 15  CREATININE 1.0 1.37* 1.06* 0.86 0.88  CALCIUM  --  9.9 8.6* 8.6* 9.5  PHOS  --   --   --   --  2.3*   GFR: Estimated Creatinine Clearance: 59.3 mL/min (by C-G formula based on SCr of 0.88 mg/dL). Liver Function Tests: Recent Labs  Lab 02/14/17 02/14/17 2319 02/15/17 0342 02/16/17 0614  AST 22 26 27  83*  ALT 38* 38 33 71*  ALKPHOS 133* 112 102 196*  BILITOT  --  0.2* 0.7 1.1  PROT  --  8.2* 7.1 8.4*  ALBUMIN  --  3.4* 3.0* 3.4*   No results for input(s): LIPASE, AMYLASE in the last 168 hours. No results for input(s): AMMONIA in the last 168 hours. Coagulation Profile: Recent Labs  Lab 02/14/17 2319 02/15/17 0342  INR 1.29 1.35   Cardiac Enzymes: No results for input(s): CKTOTAL, CKMB, CKMBINDEX, TROPONINI in the last 168 hours. CBG: Recent Labs  Lab 02/15/17 2029 02/16/17 0040 02/16/17 0417 02/16/17 0551 02/16/17 0845  GLUCAP 85 239* 178* 151* 98   Urine analysis:    Component Value Date/Time   COLORURINE YELLOW 02/15/2017 0010   APPEARANCEUR HAZY (A) 02/15/2017 0010   LABSPEC 1.031 (H) 02/15/2017 0010   PHURINE 5.0 02/15/2017 0010   GLUCOSEU NEGATIVE 02/15/2017 0010   HGBUR NEGATIVE 02/15/2017 0010   BILIRUBINUR NEGATIVE 02/15/2017 0010   KETONESUR NEGATIVE 02/15/2017 0010   PROTEINUR 100 (A) 02/15/2017 0010   NITRITE NEGATIVE 02/15/2017 0010   LEUKOCYTESUR TRACE (A) 02/15/2017 0010     Radiology Studies: Reviewed images personally in health database    Scheduled Meds: . acetaminophen  650 mg Rectal Once  . bethanechol  10 mg Per Tube Q8H  . enoxaparin (LOVENOX) injection  60 mg Subcutaneous BID  . feeding supplement (PRO-STAT SUGAR FREE 64)  60 mL Per Tube TID WC  . gabapentin  300 mg Per Tube BID  . insulin aspart  0-9 Units Subcutaneous Q4H  . levETIRAcetam  500 mg  Per Tube BID  . multivitamin  15 mL Per Tube Daily  . pantoprazole sodium  40 mg Per Tube Daily  . polyethylene glycol  17 g Per Tube Daily  . potassium chloride  40 mEq Oral Daily  . tiZANidine  6 mg Per Tube TID   Continuous Infusions: . feeding supplement (JEVITY 1.5 CAL/FIBER) Stopped (02/16/17 0247)  . piperacillin-tazobactam (ZOSYN)  IV 3.375 g (02/16/17 1610)  . vancomycin Stopped (02/15/17 2219)     LOS: 1 day    Time spent: no charge    Pleas Koch, MD [facility] ([phone]   If 7PM-7AM, please contact night-coverage [email or web address] Password [email or web address] 02/16/2017, 9:05 AM

## 2017-02-16 NOTE — Significant Event (Signed)
Rapid Response Event Note  Overview: Called by RN about patient being in distress and low oxygen saturations.  Initial Focused Assessment: While walking to the patient's room, I could hear the patient's secretions gurgling in her mouth and throat. Sats were 70% and I had asked that the patient be placed on NRB 15L.  Patient was clenching her jaw and grinding her teeth so I was unable to suction her oral cavity but I was able to NTS the patient, after each pass ( I did total of 4), sats improved and so did work of breathing but it was brief, patient's oral secretions were hard to manage, patient does have a weak cough is not able to clear the secretions. Lung sounds + rhonchi/crackles and patient is actively aspirating. Patient is able to follow commands and nods appropriately  Patient is s/p stroke that occurred over 2 years, + PEG tube, TF and fluids were held. Sats only improved to 90-92% on 15L NRB.    Interventions: -- STAT CXR -- Lasix IV  Plan of Care (if not transferred): -- TRH was paged by me, I updated him, and he was coming up to see the patient. I was called away to another emergency so I instructed the RN to NTS patient if needed and to call RT. -- When I returned, PCCM also came to see the patient.  WOB improved, sats improved, patient was transitioned to HFNC 10L and was more comfortable now.  -- Patient will be moved to 4M MICU for observation (if needed will intubated if patient and family wish that) -- Staff to transport to 4M  Event Summary:  End Time 0400  Lorian Yaun R

## 2017-02-16 NOTE — Progress Notes (Signed)
Patient had hit call light button, charge RN into check on patient and requested primary RN to come to bedside.  This RN to bedside and RN could hear patient's secretions gurgling in her throat/mouth.  RN encouraged patient to cough but patient's cough weak and unable to cough anything up.  RN suctioned patient's mouth with [place]auer.  Patient saturations in the 70's on room air.  IVF fluids and tube feeding stopped.  Patient lung sounds crackling bilaterally.  Rapid response called.  Patient placed on non-rebreather.  Rapid response to bedside.

## 2017-02-17 LAB — COMPREHENSIVE METABOLIC PANEL
ALBUMIN: 2.4 g/dL — AB (ref 3.5–5.0)
ALT: 51 U/L (ref 14–54)
ANION GAP: 11 (ref 5–15)
AST: 32 U/L (ref 15–41)
Alkaline Phosphatase: 122 U/L (ref 38–126)
BILIRUBIN TOTAL: 0.5 mg/dL (ref 0.3–1.2)
BUN: 23 mg/dL — ABNORMAL HIGH (ref 6–20)
CO2: 28 mmol/L (ref 22–32)
Calcium: 9 mg/dL (ref 8.9–10.3)
Chloride: 111 mmol/L (ref 101–111)
Creatinine, Ser: 0.75 mg/dL (ref 0.44–1.00)
GFR calc non Af Amer: >60 mL/min (ref >60)
GLUCOSE: 100 mg/dL — AB (ref 65–99)
POTASSIUM: 3.2 mmol/L — AB (ref 3.5–5.1)
SODIUM: 150 mmol/L — AB (ref 135–145)
TOTAL PROTEIN: 6.5 g/dL (ref 6.5–8.1)

## 2017-02-17 LAB — GLUCOSE, CAPILLARY
GLUCOSE-CAPILLARY: 88 mg/dL (ref 65–99)
GLUCOSE-CAPILLARY: 136 mg/dL — AB (ref 65–99)
GLUCOSE-CAPILLARY: 106 mg/dL — AB (ref 65–99)
GLUCOSE-CAPILLARY: 110 mg/dL — AB (ref 65–99)
Glucose-Capillary: 114 mg/dL — ABNORMAL HIGH (ref 65–99)
Glucose-Capillary: 153 mg/dL — ABNORMAL HIGH (ref 65–99)
Glucose-Capillary: 159 mg/dL — ABNORMAL HIGH (ref 65–99)

## 2017-02-17 LAB — CBC WITH DIFFERENTIAL/PLATELET
BASOS ABS: 0 10*3/uL (ref 0.0–0.1)
Basophils Relative: 0 %
Eosinophils Absolute: 0.3 10*3/uL (ref 0.0–0.7)
Eosinophils Relative: 2 %
HEMATOCRIT: 31.6 % — AB (ref 36.0–46.0)
HEMOGLOBIN: 9.4 g/dL — AB (ref 12.0–15.0)
LYMPHS PCT: 21 %
Lymphs Abs: 3.9 10*3/uL (ref 0.7–4.0)
MCH: 29.1 pg (ref 26.0–34.0)
MCHC: 29.7 g/dL — ABNORMAL LOW (ref 30.0–36.0)
MCV: 97.8 fL (ref 78.0–100.0)
MONO ABS: 1.8 10*3/uL — AB (ref 0.1–1.0)
Monocytes Relative: 10 %
NEUTROS ABS: 12.4 10*3/uL — AB (ref 1.7–7.7)
NEUTROS PCT: 67 %
Platelets: 214 10*3/uL (ref 150–400)
RBC: 3.23 MIL/uL — ABNORMAL LOW (ref 3.87–5.11)
RDW: 15.1 % (ref 11.5–15.5)
WBC: 18.4 10*3/uL — ABNORMAL HIGH (ref 4.0–10.5)

## 2017-02-17 LAB — MAGNESIUM: MAGNESIUM: 2.3 mg/dL (ref 1.7–2.4)

## 2017-02-17 LAB — PHOSPHORUS: PHOSPHORUS: 3 mg/dL (ref 2.5–4.6)

## 2017-02-17 MED ORDER — SODIUM CHLORIDE 0.45 % IV SOLN
INTRAVENOUS | Status: AC
  Administered 2017-02-17 – 2017-02-18 (×2): via INTRAVENOUS

## 2017-02-17 MED ORDER — POTASSIUM CHLORIDE 20 MEQ/15ML (10%) PO SOLN
40.0000 meq | Freq: Every day | ORAL | Status: DC
  Administered 2017-02-17 – 2017-02-24 (×8): 40 meq
  Filled 2017-02-17 (×9): qty 30

## 2017-02-17 MED ORDER — FREE WATER
100.0000 mL | Freq: Three times a day (TID) | Status: DC
Start: 2017-02-17 — End: 2017-02-19
  Administered 2017-02-17 – 2017-02-19 (×5): 100 mL

## 2017-02-17 NOTE — Progress Notes (Signed)
Patient arrived to 6N23 A&O to self and situation, VSS, IV intact and infusing, 4L high flow O2 via [place], and a foley catheter in place.  Noted to have peg tube present in abdomen with Vital 1.5 infusing.  Skin tear present on sacrum, nurse placed foam on arrival.  Foam boots present on feet, right food drop noted.  No family present.  Patient oriented to room and equipment.  Will continue to monitor.

## 2017-02-17 NOTE — Progress Notes (Signed)
PULMONARY / CRITICAL CARE MEDICINE   Name: Rachel Vang MRN: [id number] DOB: 05/22/1965    ADMISSION DATE:  02/14/2017 CONSULTATION DATE:  02/16/2017  REFERRING MD:  [facility]   CHIEF COMPLAINT: respiratory distress  HISTORY OF PRESENT ILLNESS:   Rachel Vang is a [age] yoF with history of debilitating hemorrhagic CVA and subarachnoid hemorrhage with chronic right sided hemiparesis and dysphagia who was noted to be febrile at her living facility to 103F. She has a history of PE, DMII, DVT, epilepsy, DVT, HTN, IBS, and subarachnoid hemorrhage. The patient is aphasic at baseline, with a sacral decubitus ulcer, but denying nausea, vomiting, chest pain, dyspnea, abdominal pain, muscle aches or visual changes. She is dependent on PEG tube feeding and has decreased PO intake. Patient attested to a loose watery stool the prior day as well as intermittent abdominal pain for the past several weeks. She was progressing well at her facility as per charting review until it was noted that she was febrile to 103.   The patient admitted for sepsis of unclear etiology with evaluation and treatment initiated. Sepsis protocol was began promptly on admission with blood cultures, antibiotics and fluid resuscitation. Prior CXR clear without evidence of aspiration. UA unremarkable, blood cultures pending.   PAST MEDICAL HISTORY :  She  has a past medical history of Acute pulmonary embolism (HCC) (02/19/2015), Acute respiratory failure (HCC), Diabetes mellitus without complication (HCC), DVT (deep venous thrombosis) (HCC) (04/05/2015), Dysphagia, Epilepsy (HCC), GERD (gastroesophageal reflux disease), Hyperlipidemia, Hypertension, IBS (irritable bowel syndrome), Nontraumatic subarachnoid hemorrhage (HCC), SAH (subarachnoid hemorrhage) (HCC), and Urinary retention.  PAST SURGICAL HISTORY: She  has a past surgical history that includes Radiology with anesthesia (N/A, 01/13/2015); Esophagogastroduodenoscopy  (egd) with propofol (N/A, 02/02/2015); PEG placement (N/A, 02/02/2015); Abdominal surgery; Aneurysm coiling; Tracheostomy; Colonoscopy (N/A, 02/20/2015); ir generic historical (08/26/2015); ir generic historical (09/14/2015); IR REPLACE G-TUBE SIMPLE WO FLUORO (06/08/2016); IR REPLACE G-TUBE SIMPLE WO FLUORO (10/24/2016); and IR REPLACE G-TUBE SIMPLE WO FLUORO (01/07/2017).  No Known Allergies  No current facility-administered medications on file prior to encounter.    Current Outpatient Medications on File Prior to Encounter  Medication Sig  . acetaminophen (TYLENOL) 325 MG tablet Place 650 mg into feeding tube daily. And every 4 hours as needed for mild pain,fever  . amLODipine (NORVASC) 5 MG tablet Place 5 mg into feeding tube daily.   . bethanechol (URECHOLINE) 10 MG tablet 10 mg by PEG Tube route every 8 (eight) hours.   . cloNIDine (CATAPRES) 0.1 MG tablet Place 0.1 mg into feeding tube 2 (two) times daily.   Marland Kitchen. gabapentin (NEURONTIN) 100 MG capsule Take 300 mg by mouth 2 (two) times daily. Hold if Lathargic  . hydrocortisone cream 1 % Apply 1 application topically 3 (three) times daily. Apply to abdomen for rash  . levETIRAcetam (KEPPRA) 100 MG/ML solution Place 5 mLs (500 mg total) into feeding tube 2 (two) times daily.  . Multiple Vitamins-Minerals (DECUBI-VITE) CAPS Place 1 capsule into feeding tube daily.   . Amino Acids-Protein Hydrolys (FEEDING SUPPLEMENT, PRO-STAT SUGAR FREE 64,) LIQD Place 60 mLs into feeding tube 3 (three) times daily with meals.   . enoxaparin (LOVENOX) 60 MG/0.6ML injection Inject 60 mg into the skin 2 (two) times daily.   . metoprolol (LOPRESSOR) 100 MG tablet Place 100 mg into feeding tube 2 (two) times daily.  . metoprolol tartrate (LOPRESSOR) 25 MG tablet Place 25 mg into feeding tube 2 (two) times daily.  . Nutritional Supplements (FEEDING SUPPLEMENT, JEVITY  1.2 CAL,) LIQD 100 mls/hour, for 12 hours/day.  On at 8pm, off at 8am.  May substitute Jevity 1.5 for two cal  until tow cal becomes available  . ondansetron (ZOFRAN) 4 MG tablet Take 4 mg by mouth daily.  Marland Kitchen oxycodone (OXY-IR) 5 MG capsule Take 5 mg by mouth every 8 (eight) hours.  Marland Kitchen oxyCODONE (ROXICODONE) 5 MG immediate release tablet Place 1 tablet (5 mg total) into feeding tube every 4 (four) hours as needed for severe pain.  . Pantoprazole Sodium POWD Give 1 packet (40 mg) via G-Tube one time a day for GERD  . polyethylene glycol (MIRALAX / GLYCOLAX) packet Place 17 g into feeding tube daily.  . tizanidine (ZANAFLEX) 6 MG capsule Place 6 mg into feeding tube 3 (three) times daily. Hold if Sleepy   . UNABLE TO FIND HSG Puree diet - HSG Puree texture, Regular consistency, may have pleasure finger foods with supervision  . UNABLE TO FIND Enternal Feet Order - Give 200 ml if water via G-Tube every 4 hours  . WATER FOR INJECTION STERILE IJ Place 30-60 mLs into feeding tube. Flush with 30-60 ml water before and after meds, before initiating feedings or when there is an interruption of feeding to maintain patency.  Also flush every shift with 5-10 ml water between each medication    FAMILY HISTORY:  Patient unable to provide FHx at this time due to medical condition.   SOCIAL HISTORY: She  reports that she has quit smoking. she has never used smokeless tobacco. She reports that she does not drink alcohol or use drugs.  REVIEW OF SYSTEMS:   Negative except as per HPI.  SUBJECTIVE:  Stable overnight, denies SOB, has some pain at her wound site  VITAL SIGNS: BP 109/84   Pulse 99   Temp 98.6 F (37 C) (Oral)   Resp 14   Ht 5' (1.524 m)   Wt 130 lb 15.3 oz (59.4 kg)   LMP  (LMP Unknown)   SpO2 100%   BMI 25.58 kg/m   HEMODYNAMICS:   VENTILATOR SETTINGS:   INTAKE / OUTPUT: I/O last 3 completed shifts: In: 2805.6 [I.V.:1694.6; Other:120; NG/GT:141; IV Piggyback:850] Out: 2325 [Urine:2325]  PHYSICAL EXAMINATION: Gen:      No acute distress, comfortable HEENT:  EOMI, sclera  anicteric Neck:     No masses; no thyromegaly Lungs:    Clear to auscultation bilaterally; normal respiratory effort CV:         Difficult to auscultate as pt grinding teeth loudly during exam Regular rate and rhythm; no murmurs Abd:      + bowel sounds; soft, non-tender; no palpable masses, no distension Ext:    No edema; adequate peripheral perfusion Skin:      Sacral decub ulcer bandaged Neuro: alert and oriented x 3 Psych: normal mood and affect  LABS:  BMET Recent Labs  Lab 02/15/17 1217 02/16/17 0614 02/17/17 0231  NA 150* 151* 150*  K 3.2* 4.1 3.2*  CL 119* 111 111  CO2 22 21* 28  BUN 21* 15 23*  CREATININE 0.86 0.88 0.75  GLUCOSE 306* 124* 100*    Electrolytes Recent Labs  Lab 02/15/17 1217 02/16/17 0614 02/17/17 0231  CALCIUM 8.6* 9.5 9.0  PHOS  --  2.3*  --     CBC Recent Labs  Lab 02/15/17 0342 02/16/17 0614 02/17/17 0231  WBC 32.5* 25.5* 18.4*  HGB 11.1* 12.7 9.4*  HCT 37.3 41.1 31.6*  PLT 216 242 214  Coag's Recent Labs  Lab 02/14/17 2319 12-Mar-2017 0342  APTT  --  25  INR 1.29 1.35    Sepsis Markers Recent Labs  Lab 2017/03/12 0342 03/12/2017 0821 02/16/17 0828 02/16/17 1655  LATICACIDVEN 2.4* 2.0* 4.6* 1.6  PROCALCITON 0.29  --   --   --     ABG No results for input(s): PHART, PCO2ART, PO2ART in the last 168 hours.  Liver Enzymes Recent Labs  Lab Mar 12, 2017 0342 02/16/17 0614 02/17/17 0231  AST 27 83* 32  ALT 33 71* 51  ALKPHOS 102 196* 122  BILITOT 0.7 1.1 0.5  ALBUMIN 3.0* 3.4* 2.4*    Cardiac Enzymes No results for input(s): TROPONINI, PROBNP in the last 168 hours.  Glucose Recent Labs  Lab 02/16/17 0845 02/16/17 1236 02/16/17 1702 02/16/17 1944 02/17/17 0024 02/17/17 0338  GLUCAP 98 139* 135* 123* 153* 88    Imaging No results found. STUDIES:   CULTURES: Blood Cx x2 2022-03-12 >>>no growth x24hrs MRSA swab neg Mar 12, 2022  ANTIBIOTICS: Vancomycin 03-12-2022>>> Zosyn 1/18>>>  SIGNIFICANT EVENTS: Transferred to  ICU.  LINES/TUBES: Gastrostomy tube 12/10>>> PIV  DISCUSSION: 79 yoF who was transferred to the ICU due to airway compromise. Patient is unable to maintain her oral/pulmonary secretions without repeated suctioning. She is otherwise stable but may require urgent intubation.  ASSESSMENT / PLAN:  PULMONARY A: Increased oral secretions requiring frequent suction Suspicion for aspiration pneumonia On 2L , SpO2 >100% P:   Continue supplemental o2.   CARDIOVASCULAR A:  HTN History of PE and DVT, on home Lovenox   P:  Continue amlodipine, metoprolol, clonidine as indicated for HTN Continue gentle fluids.  RENAL A:   Cr 0.86,  Hypernatremia 150 Hypokalemia 3.2 Hyperchloremia 111 P:   CMP order active Give 1/2NS infusion at 100cc/hr  GASTROINTESTINAL A:   Patient on tube feeds pro-stat sugar free at 20ml/hr Patient NPO at this time P:   GI ppx with Protonix 40mg  Consider tapering off due to C.diff history, cannot find any history of ulcers or GI Bleeds  HEMATOLOGIC A:   Anemia with Hgb 11.1 MCV slightly elevated to 100.3 P:  APTT down to 25 Protime/INR 16.5/1.35 Monitor CBC daily  INFECTIOUS A:   Sepsis, Suspected aspiration pneumonia Elevated LA Previously treated for C diff on prior hospitalization,  P:   Continue antibiotics will consider d/c vanc and possibly narrowing zosyn to unasyn vs continuing zosyn Follow cultures continue gentle fluids. 1/2NS at 100cc/hr. Lactic acid cleared to 1.6 on 1/19 Hold further diuresis  ENDOCRINE A:   Glucose elevated to 306   Hgb A1c 6.1% No home antihyperglycemic medications listed in chart P:   SSI coverage  NEUROLOGIC A:   Epilepsy on Keppra without occurrence noted on Keppra Patient on Gabapentin for peripheral neuropathy  P:   Continue Keppra  RASS goal: Pain control with Oxycodone 5mg  per tube q4 PRN  FAMILY  - Updates:  - Inter-disciplinary family meet or Palliative Care meeting due by:  day  7  Thornell Mule MD PGY-1 Internal Medicine Pager # [phone] 02/17/2017, 6:57 AM

## 2017-02-17 NOTE — Plan of Care (Signed)
  Progressing Elimination: Will not experience complications related to bowel motility 02/17/2017 0539 - Progressing by Noe GensViverito, Oland Arquette A, RN Note Patient had two bowel movements tonight. The first was more solid and the second was loose. Tube feedings were restarted today and this was her baseline.  Skin Integrity: Risk for impaired skin integrity will decrease 02/17/2017 0539 - Progressing by Noe GensViverito, Joyelle Siedlecki A, RN Note Patient difficult to keep turned. She leans to one side or the other putting pressure on her joints. Patient padded up and foams placed to protect skin.

## 2017-02-17 NOTE — Progress Notes (Signed)
Hospitalist progress note   Rachel Vang  ZOX:[id number] DOB: 11/02/1965 DOA: 02/14/2017 PCP: Patient, No Pcp Per   Specialists:   Brief Narrative:   51.fem hypertension, diabetes mellitus type 2 intracranial bleed/SAH status post coiling 12/2014, PE left lower extremity DVT 01/2015, trauma chronic trach, prior C. difficile, chronic pain on fentanyl, prior pressure ulcers on sacrum and heel, prior GI bleed 02/20/2015 diverticulosis, internal hemorrhoids  Baseline is able to verbalize but is dependant for ADLS found to have fever chills lactate elevated temperature 103 WBC 30 empiric antibiotic started-hypernatremic 154 BUN/creatinine 41/1.3 baseline 29/0.6  Assessment & Plan:   Assessment:  The primary encounter diagnosis was Fever, unspecified fever cause. Diagnoses of Sepsis, due to unspecified organism (HCC) and SOB (shortness of breath) were also pertinent to this visit.  Sepsis etiology possible subacute aspiration--no skin deficit--no diarr Acute hypoxic respiratory failure-likely aspiration event 1/18-Rpt CXR 1 vw 1/20 no focal source--monitor on feeds and re-implement slowly-limited code--as no overt source and BC x 2 ngtd, de-escalate to Zosyn monotherapy and observe Hypernatremia-0.45 NS--> change to D5W 75 cc/h--labs am--sodium still 151.  Recheck in am.  Will need free water in tubs-starting today Diabetes mellitus type 2-history of not apparently on any meds monitor-continue gabapentin 300 3 times daily Hypertension continue amlodipine 5, metoprolol 25 twice daily, clonidine 0.1 twice daily-stable Tachy-imrpvoed Prior intracranial bleed 12/2014 resulting in aphasia and PEG tube-monitor mentation would hold OxyIR oxycodone, tizanidine and other meds with potential for confusion-would continue Keppra 5 mils twice daily Reflux continue pantoprazole 40 mg per tube   DVT prophylaxis: Lovenoex  Code Status:   Partial-ok for intubation-no CPR, NO trach   Family Communication:   D/w  sone at bedside Disposition Plan:  Inpatient SDU, 2-3 days at least   Consultants:    none  Procedures:   none  Antimicrobials:    Vanc  Zosyn from 1/18   Subjective:  trasnferre dot 6n doing ok Not talkative today sleepy No other concerns  Objective: Vitals:   02/17/17 1246 02/17/17 1300 02/17/17 1400 02/17/17 1528  BP:  95/70 98/73 95/67   Pulse:  91 93 66  Resp:  19 (!) 21 16  Temp: 99 F (37.2 C)   99.7 F (37.6 C)  TempSrc: Oral   Oral  SpO2:  99% 95% 100%  Weight:    60.7 kg (133 lb 13.1 oz)  Height:    5\' 4"  (1.626 m)    Intake/Output Summary (Last 24 hours) at 02/17/2017 1544 Last data filed at 02/17/2017 1400 Gross per 24 hour  Intake 3357.33 ml  Output 1375 ml  Net 1982.33 ml   Filed Weights   02/16/17 0500 02/17/17 0348 02/17/17 1528  Weight: 58.7 kg (129 lb 6.6 oz) 59.4 kg (130 lb 15.3 oz) 60.7 kg (133 lb 13.1 oz)    Examination:  looks about stated age eomi ncat no pallr Chest clear s1 s 2 slight tachy, no M rrr abd soft no rebound no guard Sacrum clean with no decubiti-heels wnln  Data Reviewed: I have personally reviewed following labs and imaging studies  CBC: Recent Labs  Lab 02/14/17 02/14/17 2319 02/15/17 0342 02/16/17 0614 02/17/17 0231  WBC 28.0 33.7* 32.5* 25.5* 18.4*  NEUTROABS 23 28.0* 25.0* 21.9* 12.4*  HGB 13.9 13.4 11.1* 12.7 9.4*  HCT 41 42.8 37.3 41.1 31.6*  MCV  --  99.5 100.3* 99.0 97.8  PLT 297 279 216 242 214   Basic Metabolic Panel: Recent Labs  Lab 02/14/17 2319 02/15/17 0342  02/15/17 1217 02/16/17 0614 02/17/17 0231 02/17/17 0319  NA 155* 154* 150* 151* 150*  --   K 3.6 4.0 3.2* 4.1 3.2*  --   CL 116* 118* 119* 111 111  --   CO2 26 22 22  21* 28  --   GLUCOSE 210* 105* 306* 124* 100*  --   BUN 41* 32* 21* 15 23*  --   CREATININE 1.37* 1.06* 0.86 0.88 0.75  --   CALCIUM 9.9 8.6* 8.6* 9.5 9.0  --   MG  --   --   --   --   --  2.3  PHOS  --   --   --  2.3*  --  3.0   GFR: Estimated Creatinine  Clearance: 70.2 mL/min (by C-G formula based on SCr of 0.75 mg/dL). Liver Function Tests: Recent Labs  Lab 02/14/17 02/14/17 2319 02/15/17 0342 02/16/17 0614 02/17/17 0231  AST 22 26 27  83* 32  ALT 38* 38 33 71* 51  ALKPHOS 133* 112 102 196* 122  BILITOT  --  0.2* 0.7 1.1 0.5  PROT  --  8.2* 7.1 8.4* 6.5  ALBUMIN  --  3.4* 3.0* 3.4* 2.4*   No results for input(s): LIPASE, AMYLASE in the last 168 hours. No results for input(s): AMMONIA in the last 168 hours. Coagulation Profile: Recent Labs  Lab 02/14/17 2319 02/15/17 0342  INR 1.29 1.35   Cardiac Enzymes: No results for input(s): CKTOTAL, CKMB, CKMBINDEX, TROPONINI in the last 168 hours. CBG: Recent Labs  Lab 02/16/17 1944 02/17/17 0024 02/17/17 0338 02/17/17 0829 02/17/17 1248  GLUCAP 123* 153* 88 136* 114*   Urine analysis:    Component Value Date/Time   COLORURINE YELLOW 02/15/2017 0010   APPEARANCEUR HAZY (A) 02/15/2017 0010   LABSPEC 1.031 (H) 02/15/2017 0010   PHURINE 5.0 02/15/2017 0010   GLUCOSEU NEGATIVE 02/15/2017 0010   HGBUR NEGATIVE 02/15/2017 0010   BILIRUBINUR NEGATIVE 02/15/2017 0010   KETONESUR NEGATIVE 02/15/2017 0010   PROTEINUR 100 (A) 02/15/2017 0010   NITRITE NEGATIVE 02/15/2017 0010   LEUKOCYTESUR TRACE (A) 02/15/2017 0010     Radiology Studies: Reviewed images personally in health database    Scheduled Meds: . bethanechol  10 mg Per Tube Q8H  . chlorhexidine  15 mL Mouth Rinse BID  . enoxaparin (LOVENOX) injection  60 mg Subcutaneous BID  . feeding supplement (PRO-STAT SUGAR FREE 64)  60 mL Per Tube TID WC  . feeding supplement (VITAL 1.5 CAL)  1,000 mL Per Tube Q24H  . gabapentin  300 mg Per Tube BID  . insulin aspart  0-9 Units Subcutaneous Q4H  . levETIRAcetam  500 mg Per Tube BID  . mouth rinse  15 mL Mouth Rinse q12n4p  . multivitamin  15 mL Per Tube Daily  . polyethylene glycol  17 g Per Tube Daily  . potassium chloride  40 mEq Per Tube Daily  . tiZANidine  6 mg Per  Tube TID   Continuous Infusions: . sodium chloride 100 mL/hr at 02/17/17 1300  . piperacillin-tazobactam (ZOSYN)  IV 3.375 g (02/17/17 1251)     LOS: 2 days    Time spent: no charge    Pleas KochJai Monzerrath Mcburney, MD [facility] [phone]) [phone]   If 7PM-7AM, please contact night-coverage [email or web address] Password [email or web address] 02/17/2017, 3:44 PM

## 2017-02-18 LAB — CBC
HEMATOCRIT: 33.8 % — AB (ref 36.0–46.0)
HEMOGLOBIN: 10.4 g/dL — AB (ref 12.0–15.0)
MCH: 29.3 pg (ref 26.0–34.0)
MCHC: 30.8 g/dL (ref 30.0–36.0)
MCV: 95.2 fL (ref 78.0–100.0)
Platelets: 239 10*3/uL (ref 150–400)
RBC: 3.55 MIL/uL — AB (ref 3.87–5.11)
RDW: 14.5 % (ref 11.5–15.5)
WBC: 13 10*3/uL — ABNORMAL HIGH (ref 4.0–10.5)

## 2017-02-18 LAB — COMPREHENSIVE METABOLIC PANEL
ALBUMIN: 2.8 g/dL — AB (ref 3.5–5.0)
ALK PHOS: 116 U/L (ref 38–126)
ALT: 39 U/L (ref 14–54)
ANION GAP: 15 (ref 5–15)
AST: 28 U/L (ref 15–41)
BILIRUBIN TOTAL: 0.7 mg/dL (ref 0.3–1.2)
BUN: 12 mg/dL (ref 6–20)
CALCIUM: 9.3 mg/dL (ref 8.9–10.3)
CO2: 18 mmol/L — AB (ref 22–32)
CREATININE: 0.71 mg/dL (ref 0.44–1.00)
Chloride: 105 mmol/L (ref 101–111)
GFR calc Af Amer: >60 mL/min (ref >60)
GFR calc non Af Amer: >60 mL/min (ref >60)
GLUCOSE: 98 mg/dL (ref 65–99)
Potassium: 4 mmol/L (ref 3.5–5.1)
Sodium: 138 mmol/L (ref 135–145)
TOTAL PROTEIN: 7 g/dL (ref 6.5–8.1)

## 2017-02-18 LAB — GLUCOSE, CAPILLARY
GLUCOSE-CAPILLARY: 112 mg/dL — AB (ref 65–99)
GLUCOSE-CAPILLARY: 118 mg/dL — AB (ref 65–99)
GLUCOSE-CAPILLARY: 128 mg/dL — AB (ref 65–99)
Glucose-Capillary: 120 mg/dL — ABNORMAL HIGH (ref 65–99)
Glucose-Capillary: 104 mg/dL — ABNORMAL HIGH (ref 65–99)
Glucose-Capillary: 130 mg/dL — ABNORMAL HIGH (ref 65–99)

## 2017-02-18 MED ORDER — AMOXICILLIN-POT CLAVULANATE 875-125 MG PO TABS
1.0000 | ORAL_TABLET | Freq: Two times a day (BID) | ORAL | Status: AC
  Administered 2017-02-18 – 2017-02-20 (×6): 1 via ORAL
  Filled 2017-02-18 (×6): qty 1

## 2017-02-18 NOTE — Progress Notes (Signed)
ANTICOAGULATION CONSULT NOTE - Initial Consult  Pharmacy Consult:  Lovenox Indication: pulmonary embolus   No Known Allergies  Patient Measurements: Height: 5\' 4"  (162.6 cm) Weight: 137 lb 9.1 oz (62.4 kg) IBW/kg (Calculated) : 54.7  Vital Signs: Temp: 97.9 F (36.6 C) (01/21 0348) Temp Source: Oral (01/21 0348) BP: 124/87 (01/21 0348) Pulse Rate: 97 (01/21 0348)  Labs: Recent Labs    02/16/17 0614 02/17/17 0231 02/18/17 0938  HGB 12.7 9.4* 10.4*  HCT 41.1 31.6* 33.8*  PLT 242 214 239  CREATININE 0.88 0.75 0.71    Estimated Creatinine Clearance: 70.2 mL/min (by C-G formula based on SCr of 0.71 mg/dL).    Assessment: 52 y.o. female continues on Lovenox from PTA for history of PE.  Patient's renal function is stable; no bleeding reported.   Goal of Therapy:  Full anticoagulation with Lovenox Monitor platelets by anticoagulation protocol: Yes    Plan:  Continue Lovenox 60mg  SQ Q12H CBC Q72H while on Lovenox Pharmacy will sign off and follow peripherally.  Thank you for the consult!   Indiya Izquierdo D. Laney Potashang, PharmD, BCPS Pager:  [phone] 02/18/2017, 12:48 PM

## 2017-02-18 NOTE — Consult Note (Signed)
WOC Nurse wound consult note Reason for Consult: sacral ulcer Wound type: Stage 2 left buttock  Pressure Injury POA: Yes Measurement: 1.0cm x 2.0cm x 0.1cm  Wound bed: partial thickness skin loss, appears that was bulla, unroofed now Drainage (amount, consistency, odor) none Periwound: intact  Dressing procedure/placement/frequency: Silicone foam dressing to protect and insulate, change every days and PRN soilage. Turn patient side to side.  She has heel protectors from the SNF in place now, heels are intact with evidence of recent reepithelialization.    Re consult if needed, will not follow at this time. Thanks  Loraine Freid M.D.[employer] MSN, RN,CWOCN, CNS, CWON-AP [phone])

## 2017-02-18 NOTE — Progress Notes (Addendum)
Hospitalist progress note   [name]  WUJ:[id number] DOB: [birth year] DOA: 02/14/2017 PCP: Patient, No Pcp Per   Specialists:   Brief Narrative:   51.fem hypertension, diabetes mellitus type 2 intracranial bleed/SAH status post coiling 12/2014, PE left lower extremity DVT 01/2015, trauma chronic trach, prior C. difficile, chronic pain on fentanyl, prior pressure ulcers on sacrum and heel, prior GI bleed 02/20/2015 diverticulosis, internal hemorrhoids  Baseline is able to verbalize but is dependant for ADLS found to have fever chills lactate elevated temperature 103 WBC 30 empiric antibiotic started-hypernatremic 154 BUN/creatinine 41/1.3 baseline 29/0.6  Assessment & Plan:   Assessment:  The primary encounter diagnosis was Fever, unspecified fever cause. Diagnoses of Sepsis, due to unspecified organism (HCC) and SOB (shortness of breath) were also pertinent to this visit.  Sepsis-? subacute aspiration--no skin deficit--no diarr Acute hypoxic respiratory failure-likely aspiration event 1/18-Rpt CXR 1 vw 1/20 no focal source--monitor on feeds and re-implement slowly-limited code--as no overt source and BC x 2 ngtd, de-escalate to Zosyn monotherapy and observe-- switch to PO Augmentin 1/21 and would Rx for 10 days ending 1.26.19 Hypernatremia-0.45 NS--> change to D5W 75 cc/h--labs am--sodium still 151. Await this am labs Diabetes mellitus type 2-history of not apparently on any meds monitor-continue gabapentin 300 3 times daily Hypertension continue amlodipine 5, metoprolol 25 twice daily, clonidine 0.1 twice daily-stable Tachy-improved DVT/PE in the past 01/2015--continue Rx doses LOvenoex and d/c on the same on d/c  Prior intracranial bleed 12/2014 resulting in aphasia and PEG tube-monitor mentation would hold OxyIR oxycodone, tizanidine and other meds with potential for confusion-would continue Keppra 5 mils twice daily Reflux continue pantoprazole 40 mg per tube   DVT prophylaxis:  Lovenoex  Code Status:   Partial-ok for intubation-no CPR, NO trach   Family Communication:   No family present yesterday or today--called but no pick up Disposition Plan:  On med surg today  Expect 1-2 more days then d/c back to facility   Consultants:    none  Procedures:   none  Antimicrobials:    Vanc  Zosyn from 1/18 -->1/21  Augmewntin 1/21-->stop on 1/26  Subjective:  Awake alert in nad Doesn't wish to tlak has a lot of sputum build-up Overall improved and in nad No overt cough or chills  Objective: Vitals:   02/17/17 1528 02/17/17 2110 02/17/17 2120 02/18/17 0348  BP: 95/67 111/72  124/87  Pulse: 66 (!) 103  97  Resp: 16 18  17   Temp: 99.7 F (37.6 C) (!) 101.6 F (38.7 C) 98.9 F (37.2 C) 97.9 F (36.6 C)  TempSrc: Oral Oral Oral Oral  SpO2: 100% 100%  100%  Weight: 60.7 kg (133 lb 13.1 oz)   62.4 kg (137 lb 9.1 oz)  Height: 5\' 4"  (1.626 m)       Intake/Output Summary (Last 24 hours) at 02/18/2017 1034 Last data filed at 02/18/2017 9562 Gross per 24 hour  Intake 2322.33 ml  Output 2100 ml  Net 222.33 ml   Filed Weights   02/17/17 0348 02/17/17 1528 02/18/17 0348  Weight: 59.4 kg (130 lb 15.3 oz) 60.7 kg (133 lb 13.1 oz) 62.4 kg (137 lb 9.1 oz)    Examination:  Awake alert not talking to me eomi ncat no pallor no ict Chest slight crackles R post lungfields s1 s 2 slight tachy, no M rrr abd soft no rebound no guard--Feed tube in place area clean around Didn't examine sacrum or heels today  Data Reviewed: I have personally reviewed following  labs and imaging studies  CBC: Recent Labs  Lab 02/14/17 02/14/17 2319 02/15/17 0342 02/16/17 0614 02/17/17 0231  WBC 28.0 33.7* 32.5* 25.5* 18.4*  NEUTROABS 23 28.0* 25.0* 21.9* 12.4*  HGB 13.9 13.4 11.1* 12.7 9.4*  HCT 41 42.8 37.3 41.1 31.6*  MCV  --  99.5 100.3* 99.0 97.8  PLT 297 279 216 242 214   Basic Metabolic Panel: Recent Labs  Lab 02/14/17 2319 02/15/17 0342 02/15/17 1217  02/16/17 0614 02/17/17 0231 02/17/17 0319  NA 155* 154* 150* 151* 150*  --   K 3.6 4.0 3.2* 4.1 3.2*  --   CL 116* 118* 119* 111 111  --   CO2 26 22 22  21* 28  --   GLUCOSE 210* 105* 306* 124* 100*  --   BUN 41* 32* 21* 15 23*  --   CREATININE 1.37* 1.06* 0.86 0.88 0.75  --   CALCIUM 9.9 8.6* 8.6* 9.5 9.0  --   MG  --   --   --   --   --  2.3  PHOS  --   --   --  2.3*  --  3.0   GFR: Estimated Creatinine Clearance: 70.2 mL/min (by C-G formula based on SCr of 0.75 mg/dL). Liver Function Tests: Recent Labs  Lab 02/14/17 02/14/17 2319 02/15/17 0342 02/16/17 0614 02/17/17 0231  AST 22 26 27  83* 32  ALT 38* 38 33 71* 51  ALKPHOS 133* 112 102 196* 122  BILITOT  --  0.2* 0.7 1.1 0.5  PROT  --  8.2* 7.1 8.4* 6.5  ALBUMIN  --  3.4* 3.0* 3.4* 2.4*   No results for input(s): LIPASE, AMYLASE in the last 168 hours. No results for input(s): AMMONIA in the last 168 hours. Coagulation Profile: Recent Labs  Lab 02/14/17 2319 02/15/17 0342  INR 1.29 1.35   Cardiac Enzymes: No results for input(s): CKTOTAL, CKMB, CKMBINDEX, TROPONINI in the last 168 hours. CBG: Recent Labs  Lab 02/17/17 1553 02/17/17 2038 02/17/17 2326 02/18/17 0347 02/18/17 0824  GLUCAP 106* 110* 159* 120* 104*   Urine analysis:    Component Value Date/Time   COLORURINE YELLOW 02/15/2017 0010   APPEARANCEUR HAZY (A) 02/15/2017 0010   LABSPEC 1.031 (H) 02/15/2017 0010   PHURINE 5.0 02/15/2017 0010   GLUCOSEU NEGATIVE 02/15/2017 0010   HGBUR NEGATIVE 02/15/2017 0010   BILIRUBINUR NEGATIVE 02/15/2017 0010   KETONESUR NEGATIVE 02/15/2017 0010   PROTEINUR 100 (A) 02/15/2017 0010   NITRITE NEGATIVE 02/15/2017 0010   LEUKOCYTESUR TRACE (A) 02/15/2017 0010     Radiology Studies: Reviewed images personally in health database    Scheduled Meds: . bethanechol  10 mg Per Tube Q8H  . chlorhexidine  15 mL Mouth Rinse BID  . enoxaparin (LOVENOX) injection  60 mg Subcutaneous BID  . feeding supplement  (PRO-STAT SUGAR FREE 64)  60 mL Per Tube TID WC  . feeding supplement (VITAL 1.5 CAL)  1,000 mL Per Tube Q24H  . free water  100 mL Per Tube Q8H  . gabapentin  300 mg Per Tube BID  . insulin aspart  0-9 Units Subcutaneous Q4H  . levETIRAcetam  500 mg Per Tube BID  . mouth rinse  15 mL Mouth Rinse q12n4p  . multivitamin  15 mL Per Tube Daily  . polyethylene glycol  17 g Per Tube Daily  . potassium chloride  40 mEq Per Tube Daily  . tiZANidine  6 mg Per Tube TID   Continuous Infusions: . piperacillin-tazobactam (  ZOSYN)  IV Stopped (02/18/17 0853)     LOS: 3 days    Time spent: 83    Pleas Koch, MD [facility]   If 7PM-7AM, please contact night-coverage [email or web address] Password [facility] 02/18/2017, 10:34 AM

## 2017-02-19 LAB — CBC WITH DIFFERENTIAL/PLATELET
Basophils Absolute: 0 10*3/uL (ref 0.0–0.1)
Basophils Relative: 0 %
Eosinophils Absolute: 0.2 10*3/uL (ref 0.0–0.7)
Eosinophils Relative: 2 %
HCT: 33.6 % — ABNORMAL LOW (ref 36.0–46.0)
HEMOGLOBIN: 10.6 g/dL — AB (ref 12.0–15.0)
Lymphocytes Relative: 18 %
Lymphs Abs: 1.9 10*3/uL (ref 0.7–4.0)
MCH: 29.7 pg (ref 26.0–34.0)
MCHC: 31.5 g/dL (ref 30.0–36.0)
MCV: 94.1 fL (ref 78.0–100.0)
MONOS PCT: 6 %
Monocytes Absolute: 0.6 10*3/uL (ref 0.1–1.0)
NEUTROS PCT: 74 %
Neutro Abs: 7.6 10*3/uL (ref 1.7–7.7)
Platelets: 243 10*3/uL (ref 150–400)
RBC: 3.57 MIL/uL — ABNORMAL LOW (ref 3.87–5.11)
RDW: 14.5 % (ref 11.5–15.5)
WBC: 10.3 10*3/uL (ref 4.0–10.5)

## 2017-02-19 LAB — COMPREHENSIVE METABOLIC PANEL
ALT: 38 U/L (ref 14–54)
ANION GAP: 13 (ref 5–15)
AST: 25 U/L (ref 15–41)
Albumin: 2.6 g/dL — ABNORMAL LOW (ref 3.5–5.0)
Alkaline Phosphatase: 101 U/L (ref 38–126)
BILIRUBIN TOTAL: 0.5 mg/dL (ref 0.3–1.2)
BUN: 11 mg/dL (ref 6–20)
CO2: 20 mmol/L — ABNORMAL LOW (ref 22–32)
Calcium: 9.4 mg/dL (ref 8.9–10.3)
Chloride: 106 mmol/L (ref 101–111)
Creatinine, Ser: 0.57 mg/dL (ref 0.44–1.00)
GFR calc non Af Amer: >60 mL/min (ref >60)
Glucose, Bld: 139 mg/dL — ABNORMAL HIGH (ref 65–99)
POTASSIUM: 3.8 mmol/L (ref 3.5–5.1)
Sodium: 139 mmol/L (ref 135–145)
TOTAL PROTEIN: 6.7 g/dL (ref 6.5–8.1)

## 2017-02-19 LAB — GLUCOSE, CAPILLARY
GLUCOSE-CAPILLARY: 129 mg/dL — AB (ref 65–99)
GLUCOSE-CAPILLARY: 122 mg/dL — AB (ref 65–99)
GLUCOSE-CAPILLARY: 125 mg/dL — AB (ref 65–99)
Glucose-Capillary: 132 mg/dL — ABNORMAL HIGH (ref 65–99)
Glucose-Capillary: 158 mg/dL — ABNORMAL HIGH (ref 65–99)

## 2017-02-19 MED ORDER — VITAL 1.5 CAL PO LIQD
1000.0000 mL | ORAL | Status: DC
  Filled 2017-02-19 (×2): qty 1000

## 2017-02-19 MED ORDER — FREE WATER
200.0000 mL | Freq: Three times a day (TID) | Status: DC
  Administered 2017-02-19 – 2017-02-20 (×3): 200 mL

## 2017-02-19 MED ORDER — ZINC OXIDE 40 % EX OINT
TOPICAL_OINTMENT | Freq: Four times a day (QID) | CUTANEOUS | Status: DC
  Administered 2017-02-19 – 2017-02-24 (×15): via TOPICAL
  Filled 2017-02-19 (×2): qty 114

## 2017-02-19 NOTE — Progress Notes (Signed)
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 difficile, chronic pain on fentanyl, prior pressure ulcers on sacrum and heel, prior GI bleed 02/20/2015 diverticulosis, internal hemorrhoids  Baseline is able to verbalize but is dependant for ADLS found to have fever chills lactate elevated temperature 103 WBC 30 empiric antibiotic started-hypernatremic 154 BUN/creatinine 41/1.3 baseline 29/0.6  Assessment & Plan:   Assessment:  The primary encounter diagnosis was Fever, unspecified fever cause. Diagnoses of Sepsis, due to unspecified organism (HCC) and SOB (shortness of breath) were also pertinent to this visit.  Sepsis-? subacute aspiration--no skin deficit--no diarr Acute hypoxic respiratory failure-likely aspiration event 1/18-Rpt CXR 1 vw 1/20 no focal source-- feeds re-implement slowly 1/23, monitor for further aspiration over today-limited code- no overt source and BC x 2 ngtd, de-escalate to Zosyn monotherapy-- switch to PO Augmentin 1/21 and would Rx for 10 days ending 1.26.19--recheck CXR am 1/23 Hypernatremia- D5W 75 cc/h-sodium still 151----139-becoming a little fluid overloaded--hold IVF, recheck labs am Diabetes mellitus type 2-history of not apparently on any meds monitor-continue gabapentin 300 3 times daily Hypertension continue amlodipine 5, metoprolol 25 twice daily, clonidine 0.1 twice daily-stable Tachy-improved AKI on admission-23/0.75--->12.071--stop ivf as above-place on free water and ioncrease DVT/PE in the past 01/2015--continue Rx doses Lovenoex and d/c on the same on d/c  Prior intracranial bleed 12/2014 resulting in aphasia and PEG tube-monitor mentation would hold OxyIR oxycodone, tizanidine and  other meds with potential for confusion-would continue Keppra 5 mils twice daily Reflux continue pantoprazole 40 mg per tube Stag ii decubitus ulcer-not visualized.  Turn q 2 hr   DVT prophylaxis: Lovenoex  Code Status:   Partial-ok for intubation-no CPR, NO trach   Family Communication:   No family  Disposition Plan:  On med surg today  Expect 1-2 more days then d/c back to facility   Consultants:    none  Procedures:   none  Antimicrobials:    Vanc  Zosyn from 1/18 -->1/21  Augmewntin 1/21-->stop on 1/26  Subjective:  Awake alert in nad Nursing reported some crackels in chest Overall improved-she doesn't seem sob No cp laconic  Objective: Vitals:   02/18/17 2029 02/19/17 0426 02/19/17 0458 02/19/17 1015  BP: (!) 124/94 101/79  133/85  Pulse: (!) 111 97  (!) 112  Resp:    18  Temp: 98.8 F (37.1 C) 98.3 F (36.8 C)  99.8 F (37.7 C)  TempSrc: Oral Oral  Oral  SpO2: 100% 100%  100%  Weight:   59.5 kg (131 lb 2.8 oz)   Height:        Intake/Output Summary (Last 24 hours) at 02/19/2017 1050 Last data filed at 02/19/2017 1020 Gross per 24 hour  Intake 320 ml  Output 1750 ml  Net -1430 ml   Filed Weights   02/17/17 1528 02/18/17 0348 02/19/17 0458  Weight: 60.7 kg (133 lb 13.1 oz) 62.4 kg (137 lb 9.1 oz) 59.5 kg (131 lb 2.8 oz)    Examination:  Awake alert not talking to me Power 5/5 UE, contracture deformities in le's Chest clear overall no overt change s1 s 2 slight tachy, no M rrr abd soft no rebound no guard--Feed tube in place area clean around Didn't examine sacrum or heels today  Data Reviewed:  I have personally reviewed following labs and imaging studies  CBC: Recent Labs  Lab 02/14/17 2319 02/15/17 0342 02/16/17 0614 02/17/17 0231 02/18/17 0938 02/19/17 0806  WBC 33.7* 32.5* 25.5* 18.4* 13.0* 10.3  NEUTROABS 28.0* 25.0* 21.9* 12.4*  --  7.6  HGB 13.4 11.1* 12.7 9.4* 10.4* 10.6*  HCT 42.8 37.3 41.1 31.6* 33.8* 33.6*  MCV 99.5  100.3* 99.0 97.8 95.2 94.1  PLT 279 216 242 214 239 243   Basic Metabolic Panel: Recent Labs  Lab 02/15/17 1217 02/16/17 0614 02/17/17 0231 02/17/17 0319 02/18/17 0938 02/19/17 0757  NA 150* 151* 150*  --  138 139  K 3.2* 4.1 3.2*  --  4.0 3.8  CL 119* 111 111  --  105 106  CO2 22 21* 28  --  18* 20*  GLUCOSE 306* 124* 100*  --  98 139*  BUN 21* 15 23*  --  12 11  CREATININE 0.86 0.88 0.75  --  0.71 0.57  CALCIUM 8.6* 9.5 9.0  --  9.3 9.4  MG  --   --   --  2.3  --   --   PHOS  --  2.3*  --  3.0  --   --    GFR: Estimated Creatinine Clearance: 70.2 mL/min (by C-G formula based on SCr of 0.57 mg/dL). Liver Function Tests: Recent Labs  Lab 02/15/17 0342 02/16/17 0614 02/17/17 0231 02/18/17 0938 02/19/17 0757  AST 27 83* 32 28 25  ALT 33 71* 51 39 38  ALKPHOS 102 196* 122 116 101  BILITOT 0.7 1.1 0.5 0.7 0.5  PROT 7.1 8.4* 6.5 7.0 6.7  ALBUMIN 3.0* 3.4* 2.4* 2.8* 2.6*   No results for input(s): LIPASE, AMYLASE in the last 168 hours. No results for input(s): AMMONIA in the last 168 hours. Coagulation Profile: Recent Labs  Lab 02/14/17 2319 02/15/17 0342  INR 1.29 1.35   Cardiac Enzymes: No results for input(s): CKTOTAL, CKMB, CKMBINDEX, TROPONINI in the last 168 hours. CBG: Recent Labs  Lab 02/18/17 1617 02/18/17 2028 02/18/17 2345 02/19/17 0425 02/19/17 0752  GLUCAP 112* 118* 128* 132* 158*   Urine analysis:    Component Value Date/Time   COLORURINE YELLOW 02/15/2017 0010   APPEARANCEUR HAZY (A) 02/15/2017 0010   LABSPEC 1.031 (H) 02/15/2017 0010   PHURINE 5.0 02/15/2017 0010   GLUCOSEU NEGATIVE 02/15/2017 0010   HGBUR NEGATIVE 02/15/2017 0010   BILIRUBINUR NEGATIVE 02/15/2017 0010   KETONESUR NEGATIVE 02/15/2017 0010   PROTEINUR 100 (A) 02/15/2017 0010   NITRITE NEGATIVE 02/15/2017 0010   LEUKOCYTESUR TRACE (A) 02/15/2017 0010     Radiology Studies: Reviewed images personally in health database    Scheduled Meds: .  amoxicillin-clavulanate  1 tablet Oral Q12H  . bethanechol  10 mg Per Tube Q8H  . chlorhexidine  15 mL Mouth Rinse BID  . enoxaparin (LOVENOX) injection  60 mg Subcutaneous BID  . feeding supplement (PRO-STAT SUGAR FREE 64)  60 mL Per Tube TID WC  . feeding supplement (VITAL 1.5 CAL)  1,000 mL Per Tube Q24H  . free water  200 mL Per Tube Q8H  . gabapentin  300 mg Per Tube BID  . insulin aspart  0-9 Units Subcutaneous Q4H  . levETIRAcetam  500 mg Per Tube BID  . mouth rinse  15 mL Mouth Rinse q12n4p  . multivitamin  15 mL Per Tube Daily  . polyethylene glycol  17 g Per Tube Daily  . potassium chloride  40 mEq Per  Tube Daily  . tiZANidine  6 mg Per Tube TID   Continuous Infusions:    LOS: 4 days    Time spent: 15    Pleas Koch, MD [facility] ([facility]   If 7PM-7AM, please contact night-coverage [email or web address] Password [email or web address] 02/19/2017, 10:50 AM

## 2017-02-19 NOTE — NC FL2 (Signed)
[place] MEDICAID FL2 LEVEL OF CARE SCREENING TOOL     IDENTIFICATION  Patient Name: Rachel Vang Birthdate: 04/21/1965 Sex: female Admission Date (Current Location): 02/14/2017  [facility] [place]   Facility and Address:  [facility] [place], [address], [place], [place] [address]      Provider Number: [id number]  Attending Physician Name and Address:  Rhetta MuraSamtani, Jai-Gurmukh, MD  Relative Name and Phone Number:       Current Level of Care: Hospital Recommended Level of Care: Skilled Nursing Facility Prior Approval Number:    Date Approved/Denied:   PASRR Number: [id number]  Discharge Plan: SNF    Current Diagnoses: Patient Active Problem List   Diagnosis Date Noted  . Pressure injury of skin 02/16/2017  . SOB (shortness of breath)   . Fever   . Sepsis (HCC) 02/15/2017  . Foot drop, right foot 10/18/2016  . Cough, persistent 09/17/2016  . CVA (cerebrovascular accident) (HCC) 08/30/2016  . GERD without esophagitis 08/30/2016  . History of stroke 08/16/2016  . Localized swelling on left hand 08/16/2016  . Pain and swelling of left wrist 08/16/2016  . Chronic pulmonary embolism (HCC) 07/03/2016  . Weight loss, non-intentional 06/13/2016  . Hemiparesis affecting right side as late effect of stroke (HCC) 09/25/2015  . Type II diabetes mellitus with neurological manifestations (HCC) 05/20/2015  . Essential hypertension, benign 05/20/2015  . S/P percutaneous endoscopic gastrostomy (PEG) tube placement (HCC) 05/20/2015  . DVT (deep venous thrombosis) (HCC) 04/05/2015  . Protein-calorie malnutrition, severe (HCC) 03/25/2015  . Dysphagia 02/18/2015  . Seizures (HCC) 02/18/2015  . Urine retention 02/18/2015  . History of ETT   . Subarachnoid hemorrhage (HCC)     Orientation RESPIRATION BLADDER Height & Weight     Self, Situation  O2(nasal canula 2L) Indwelling catheter, Incontinent Weight: 131 lb 2.8 oz (59.5 kg) Height:   5\' 4"  (162.6 cm)  BEHAVIORAL SYMPTOMS/MOOD NEUROLOGICAL BOWEL NUTRITION STATUS      Incontinent(gastrostomy tube) Diet(See discharge summary)- PEG tube; tube feedings  AMBULATORY STATUS COMMUNICATION OF NEEDS Skin   Total Care Verbally PU Stage and Appropriate Care(also has unstageable on R Heel)   PU Stage 2 Dressing: (buttocks)                   Personal Care Assistance Level of Assistance  Bathing, Feeding, Dressing Bathing Assistance: Maximum assistance Feeding assistance: Independent Dressing Assistance: Maximum assistance     Functional Limitations Info  Sight, Hearing, Speech Sight Info: Adequate Hearing Info: Adequate Speech Info: Adequate(mostly nonverbal)    SPECIAL CARE FACTORS FREQUENCY                       Contractures Contractures Info: Not present    Additional Factors Info  Code Status, Allergies Code Status Info: Partial Code Allergies Info: No Known Allergies           Current Medications (02/19/2017):  This is the current hospital active medication list Current Facility-Administered Medications  Medication Dose Route Frequency Provider Last Rate Last Dose  . acetaminophen (TYLENOL) tablet 650 mg  650 mg Oral Q6H PRN Eduard ClosKakrakandy, Arshad N, MD   650 mg at 02/19/17 0453   Or  . acetaminophen (TYLENOL) suppository 650 mg  650 mg Rectal Q6H PRN Eduard ClosKakrakandy, Arshad N, MD   650 mg at 02/16/17 1243  . amoxicillin-clavulanate (AUGMENTIN) 875-125 MG per tablet 1 tablet  1 tablet Oral Q12H Rhetta MuraSamtani, Jai-Gurmukh, MD  1 tablet at 02/19/17 1002  . bethanechol (URECHOLINE) tablet 10 mg  10 mg Per Tube Q8H Eduard Clos, MD   10 mg at 02/19/17 1423  . chlorhexidine (PERIDEX) 0.12 % solution 15 mL  15 mL Mouth Rinse BID Scatliffe, Baxter Hire D, MD   15 mL at 02/19/17 1005  . enoxaparin (LOVENOX) injection 60 mg  60 mg Subcutaneous BID Eduard Clos, MD   60 mg at 02/19/17 1005  . feeding supplement (PRO-STAT SUGAR FREE 64) liquid 60 mL  60 mL Per  Tube TID WC Eduard Clos, MD   60 mL at 02/19/17 1627  . feeding supplement (VITAL 1.5 CAL) liquid 1,000 mL  1,000 mL Per Tube Q24H Scatliffe, Kristen D, MD 20 mL/hr at 02/19/17 1420 1,000 mL at 02/19/17 1420  . free water 200 mL  200 mL Per Tube Q8H Samtani, Jai-Gurmukh, MD   200 mL at 02/19/17 1423  . gabapentin (NEURONTIN) 250 MG/5ML solution 300 mg  300 mg Per Tube BID Eduard Clos, MD   300 mg at 02/19/17 1259  . insulin aspart (novoLOG) injection 0-9 Units  0-9 Units Subcutaneous Q4H Eduard Clos, MD   1 Units at 02/19/17 1627  . levETIRAcetam (KEPPRA) 100 MG/ML solution 500 mg  500 mg Per Tube BID Eduard Clos, MD   500 mg at 02/19/17 1003  . MEDLINE mouth rinse  15 mL Mouth Rinse q12n4p Scatliffe, Gypsy Balsam, MD   15 mL at 02/19/17 1629  . metoprolol tartrate (LOPRESSOR) injection 2.5-5 mg  2.5-5 mg Intravenous Q4H PRN Lanelle Bal, MD   5 mg at 02/16/17 0918  . multivitamin liquid 15 mL  15 mL Per Tube Daily Eduard Clos, MD   15 mL at 02/19/17 1003  . ondansetron (ZOFRAN) tablet 4 mg  4 mg Oral Q6H PRN Eduard Clos, MD       Or  . ondansetron [facility]) injection 4 mg  4 mg Intravenous Q6H PRN Eduard Clos, MD      . oxyCODONE (Oxy IR/ROXICODONE) immediate release tablet 5 mg  5 mg Per Tube Q4H PRN Eduard Clos, MD   5 mg at 02/19/17 1805  . polyethylene glycol (MIRALAX / GLYCOLAX) packet 17 g  17 g Per Tube Daily Eduard Clos, MD   17 g at 02/19/17 1001  . potassium chloride 20 MEQ/15ML (10%) solution 40 mEq  40 mEq Per Tube Daily Angelita Ingles, MD   40 mEq at 02/19/17 0826  . tiZANidine (ZANAFLEX) tablet 6 mg  6 mg Per Tube TID Rhetta Mura, MD   6 mg at 02/19/17 1628     Discharge Medications: Please see discharge summary for a list of discharge medications.  Relevant Imaging Results:  Relevant Lab Results:   Additional Information SS# [id number] [place], [place]

## 2017-02-20 ENCOUNTER — Inpatient Hospital Stay (HOSPITAL_COMMUNITY): Payer: Medicaid Other

## 2017-02-20 DIAGNOSIS — R197 Diarrhea, unspecified: Secondary | ICD-10-CM

## 2017-02-20 DIAGNOSIS — I69351 Hemiplegia and hemiparesis following cerebral infarction affecting right dominant side: Secondary | ICD-10-CM

## 2017-02-20 DIAGNOSIS — R569 Unspecified convulsions: Secondary | ICD-10-CM

## 2017-02-20 DIAGNOSIS — K591 Functional diarrhea: Secondary | ICD-10-CM

## 2017-02-20 DIAGNOSIS — I825Y2 Chronic embolism and thrombosis of unspecified deep veins of left proximal lower extremity: Secondary | ICD-10-CM

## 2017-02-20 DIAGNOSIS — J9621 Acute and chronic respiratory failure with hypoxia: Secondary | ICD-10-CM

## 2017-02-20 DIAGNOSIS — J181 Lobar pneumonia, unspecified organism: Secondary | ICD-10-CM

## 2017-02-20 DIAGNOSIS — E1149 Type 2 diabetes mellitus with other diabetic neurological complication: Secondary | ICD-10-CM

## 2017-02-20 DIAGNOSIS — D72825 Bandemia: Secondary | ICD-10-CM

## 2017-02-20 DIAGNOSIS — Z931 Gastrostomy status: Secondary | ICD-10-CM

## 2017-02-20 DIAGNOSIS — E860 Dehydration: Secondary | ICD-10-CM

## 2017-02-20 DIAGNOSIS — R5081 Fever presenting with conditions classified elsewhere: Secondary | ICD-10-CM

## 2017-02-20 DIAGNOSIS — R509 Fever, unspecified: Secondary | ICD-10-CM

## 2017-02-20 LAB — COMPREHENSIVE METABOLIC PANEL
ALT: 31 U/L (ref 14–54)
ANION GAP: 13 (ref 5–15)
AST: 28 U/L (ref 15–41)
Albumin: 2.7 g/dL — ABNORMAL LOW (ref 3.5–5.0)
Alkaline Phosphatase: 97 U/L (ref 38–126)
BUN: 19 mg/dL (ref 6–20)
CALCIUM: 9.5 mg/dL (ref 8.9–10.3)
CHLORIDE: 107 mmol/L (ref 101–111)
CO2: 18 mmol/L — AB (ref 22–32)
Creatinine, Ser: 0.67 mg/dL (ref 0.44–1.00)
GFR calc non Af Amer: >60 mL/min (ref >60)
Glucose, Bld: 123 mg/dL — ABNORMAL HIGH (ref 65–99)
POTASSIUM: 4 mmol/L (ref 3.5–5.1)
SODIUM: 138 mmol/L (ref 135–145)
Total Bilirubin: 0.7 mg/dL (ref 0.3–1.2)
Total Protein: 7.1 g/dL (ref 6.5–8.1)

## 2017-02-20 LAB — GLUCOSE, CAPILLARY
GLUCOSE-CAPILLARY: 107 mg/dL — AB (ref 65–99)
GLUCOSE-CAPILLARY: 128 mg/dL — AB (ref 65–99)
GLUCOSE-CAPILLARY: 125 mg/dL — AB (ref 65–99)
Glucose-Capillary: 108 mg/dL — ABNORMAL HIGH (ref 65–99)
Glucose-Capillary: 119 mg/dL — ABNORMAL HIGH (ref 65–99)
Glucose-Capillary: 125 mg/dL — ABNORMAL HIGH (ref 65–99)

## 2017-02-20 LAB — CBC WITH DIFFERENTIAL/PLATELET
BASOS PCT: 0 %
Basophils Absolute: 0 10*3/uL (ref 0.0–0.1)
Eosinophils Absolute: 0.2 10*3/uL (ref 0.0–0.7)
Eosinophils Relative: 2 %
HEMATOCRIT: 33 % — AB (ref 36.0–46.0)
HEMOGLOBIN: 10.5 g/dL — AB (ref 12.0–15.0)
LYMPHS PCT: 24 %
Lymphs Abs: 3 10*3/uL (ref 0.7–4.0)
MCH: 30.2 pg (ref 26.0–34.0)
MCHC: 31.8 g/dL (ref 30.0–36.0)
MCV: 94.8 fL (ref 78.0–100.0)
MONOS PCT: 7 %
Monocytes Absolute: 0.8 10*3/uL (ref 0.1–1.0)
NEUTROS ABS: 8.5 10*3/uL — AB (ref 1.7–7.7)
NEUTROS PCT: 67 %
Platelets: 320 10*3/uL (ref 150–400)
RBC: 3.48 MIL/uL — ABNORMAL LOW (ref 3.87–5.11)
RDW: 14.9 % (ref 11.5–15.5)
WBC: 12.6 10*3/uL — ABNORMAL HIGH (ref 4.0–10.5)

## 2017-02-20 LAB — CULTURE, BLOOD (ROUTINE X 2)
CULTURE: NO GROWTH
CULTURE: NO GROWTH
Culture: NO GROWTH
Special Requests: ADEQUATE
Special Requests: ADEQUATE
Special Requests: ADEQUATE

## 2017-02-20 MED ORDER — DIPHENOXYLATE-ATROPINE 2.5-0.025 MG/5ML PO LIQD
5.0000 mL | Freq: Four times a day (QID) | ORAL | Status: DC | PRN
  Filled 2017-02-20: qty 5

## 2017-02-20 MED ORDER — DIPHENOXYLATE-ATROPINE 2.5-0.025 MG/5ML PO LIQD
10.0000 mL | Freq: Once | ORAL | Status: AC
  Administered 2017-02-20: 10 mL via ORAL
  Filled 2017-02-20: qty 10

## 2017-02-20 MED ORDER — JEVITY 1.5 CAL/FIBER PO LIQD
1000.0000 mL | ORAL | Status: DC
  Administered 2017-02-20: 1000 mL
  Filled 2017-02-20 (×8): qty 1000

## 2017-02-20 MED ORDER — PRO-STAT SUGAR FREE PO LIQD
30.0000 mL | Freq: Three times a day (TID) | ORAL | Status: DC
  Administered 2017-02-20 – 2017-02-24 (×10): 30 mL
  Filled 2017-02-20 (×12): qty 30

## 2017-02-20 MED ORDER — FREE WATER
200.0000 mL | Freq: Four times a day (QID) | Status: DC
  Administered 2017-02-20 – 2017-02-22 (×8): 200 mL

## 2017-02-20 MED ORDER — VITAL 1.5 CAL PO LIQD
1000.0000 mL | ORAL | Status: DC
  Filled 2017-02-20 (×2): qty 1000

## 2017-02-20 NOTE — Progress Notes (Signed)
Nutrition Follow-up  DOCUMENTATION CODES:   Not applicable  INTERVENTION:   -D/c Vital 1.5   -Initiate Jevity 1.5 @ 50 ml/hr via PEG 30 ml Prostat TID.    Continue 200 ml free water flush QID  Tube feeding regimen provides 2100 kcal (>100% of needs), 122 grams of protein, and 2112 ml of H2O.   NUTRITION DIAGNOSIS:   Inadequate oral intake related to inability to eat as evidenced by NPO status.  Ongoing  GOAL:   Patient will meet greater than or equal to 90% of their needs  Progressing   MONITOR:   TF tolerance, I & O's, Skin, Diet advancement  REASON FOR ASSESSMENT:   Consult Enteral/tube feeding initiation and management  ASSESSMENT:   52 yo female with PMH of ICH, CVA, HTN, sacral decubitus, seizures, PEG, who was admitted on 1/17 with tachycardia, fever, chills. Work-up ongoing.  1/19- rapid response called due to respiratory distress and low O2, transferred to ICU 1/20 transferred to floor  1/21- s/p CWOCN evaluation; pt with stage 2 pressure injury to buttocks    Pt more alert and interactive than previous RD visit; pt able to shake this RD's hand and shake head to close ended questions. Denies any abdominal pain with TF; acknowledged TF by pointing to feeding pump.   Vital 1.5 infusing via PEG @ 10 ml/hr (also receiving 60 ml Prostat TID), which is providing 960 kcals, 106 grams protein, and 183 ml free water (983 ml total free water with inclusion of 200 ml free water flush 4 times daily), meeting 535 of estimated kcal needs and 100% of estimated protein needs.   Case discussed with RN, who reports that TF was decreased last night due to diarrhea. Per discussion with Dr. Butler Denmarkizwan, plan to advance TF to 50 ml/hr and monitor. RD will adjust TF regimen so pt will better meet needs.   Labs reviewed: CBGS: 107-119.   Diet Order:  Diet NPO time specified  EDUCATION NEEDS:   No education needs have been identified at this time  Skin:  Skin Assessment: Skin  Integrity Issues: Skin Integrity Issues:: Stage II Stage II: buttocks Other: n/a  Last BM:  02/19/17  Height:   Ht Readings from Last 1 Encounters:  02/17/17 5\' 4"  (1.626 m)    Weight:   Wt Readings from Last 1 Encounters:  02/20/17 136 lb 0.4 oz (61.7 kg)    Ideal Body Weight:  45.5 kg  BMI:  Body mass index is 23.35 kg/m.  Estimated Nutritional Needs:   Kcal:  1800-2000  Protein:  105-120 grams  Fluid:  > 1.8 L    Tawona Filsinger A. Mayford KnifeWilliams, RD, LDN, CDE Pager: [phone] After hours Pager: [phone]

## 2017-02-20 NOTE — Progress Notes (Addendum)
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  She presented with fever of 103 and a WBC count of 30 and lactic acidosis. She was admitted for sepsis but source was uncertain at the time on admission. Also noted to have hypernatremia and AKI.   Subjective: Non-verbal    Assessment & Plan:   Principal Problem:   Sepsis  - initial suspicion was aspiration as she had acute hypoxic respiratory failure as well - CXR on 1/20 and CXR done again today both are negative for infiltrates - blood cultures negative - no UTI - would stop antibiotics after today which would complete a 7 day course  Active Problems: Diarrhea - d/c Miralax- it was given yesterday and again today -  possibly also related to antibiotics - abdominal exam is benign -   Lomotil PRN  Tube feeds - tube feeds were at 20 cc/hr and were turned off by night cover provider after diarrhea occurred- will resume at 50 cc/hr today  Hypernatremia AKI - improved with hydration- IVF stopped- cont free water per PEG and increase to QID  DM2 - diet control  HTN - cont Amlodipine, Metoprolol, Clonidine  H/o CVA with PEG, right hemiparesis and aphasia - lives at a SNF  Seizure disorder - cont Keppra    h/o  DVT/ PE  - cont Lovenox    DVT prophylaxis: Lovenox Code Status: no CPR but she would like intubation/ ACLS meds, cardioversion and non-invasive ventilation Family Communication:  Disposition Plan: SVF when stable Consultants:    Procedures:    Antimicrobials:  Anti-infectives (From admission, onward)   Start     Dose/Rate Route Frequency Ordered Stop   02/18/17 1100   amoxicillin-clavulanate (AUGMENTIN) 875-125 MG per tablet 1 tablet     1 tablet Oral Every 12 hours 02/18/17 1044     02/15/17 2200  vancomycin (VANCOCIN) IVPB 1000 mg/200 mL premix  Status:  Discontinued     1,000 mg 200 mL/hr over 60 Minutes Intravenous Every 24 hours 02/15/17 0051 02/17/17 0953   02/15/17 1930  piperacillin-tazobactam (ZOSYN) IVPB 3.375 g  Status:  Discontinued     3.375 g 12.5 mL/hr over 240 Minutes Intravenous Every 8 hours 02/15/17 1921 02/18/17 1044   02/15/17 0600  piperacillin-tazobactam (ZOSYN) IVPB 3.375 g  Status:  Discontinued     3.375 g 12.5 mL/hr over 240 Minutes Intravenous Every 8 hours 02/15/17 0051 02/15/17 1921   02/14/17 2330  piperacillin-tazobactam (ZOSYN) IVPB 3.375 g     3.375 g 100 mL/hr over 30 Minutes Intravenous  Once 02/14/17 2321 02/15/17 0054   02/14/17 2330  vancomycin (VANCOCIN) IVPB 1000 mg/200 mL premix     1,000 mg 200 mL/hr over 60 Minutes Intravenous  Once 02/14/17 2321 02/15/17 0303       Objective: Vitals:   02/19/17 2016 02/19/17 2034 02/20/17 0459 02/20/17 0500  BP:  122/75  125/87  Pulse:  (!) 117  (!) 108  Resp:  18  18  Temp:  98.9 F (37.2 C)  98 F (36.7 C)  TempSrc:  Oral  Oral  SpO2: 100% 99%  100%  Weight:   61.7 kg (136 lb  0.4 oz)   Height:        Intake/Output Summary (Last 24 hours) at 02/20/2017 1139 Last data filed at 02/20/2017 1012 Gross per 24 hour  Intake 0 ml  Output 1300 ml  Net -1300 ml   Filed Weights   02/18/17 0348 02/19/17 0458 02/20/17 0459  Weight: 62.4 kg (137 lb 9.1 oz) 59.5 kg (131 lb 2.8 oz) 61.7 kg (136 lb 0.4 oz)    Examination: General exam: Appears comfortable  HEENT: PERRLA, oral mucosa moist, no sclera icterus or thrush Respiratory system: Clear to auscultation. Respiratory effort normal. Cardiovascular system: S1 & S2 heard, RRR.  No murmurs  Gastrointestinal system: Abdomen soft, non-tender, nondistended. Normal bowel sound. No organomegaly- PEG intact Central nervous  system: Alert and oriented. No focal neurological deficits. Extremities: No cyanosis, clubbing or edema Skin: No rashes or ulcers   Data Reviewed: I have personally reviewed following labs and imaging studies  CBC: Recent Labs  Lab 02/15/17 0342 02/16/17 0614 02/17/17 0231 02/18/17 0938 02/19/17 0806 02/20/17 0844  WBC 32.5* 25.5* 18.4* 13.0* 10.3 12.6*  NEUTROABS 25.0* 21.9* 12.4*  --  7.6 8.5*  HGB 11.1* 12.7 9.4* 10.4* 10.6* 10.5*  HCT 37.3 41.1 31.6* 33.8* 33.6* 33.0*  MCV 100.3* 99.0 97.8 95.2 94.1 94.8  PLT 216 242 214 239 243 320   Basic Metabolic Panel: Recent Labs  Lab 02/16/17 0614 02/17/17 0231 02/17/17 0319 02/18/17 0938 02/19/17 0757 02/20/17 0844  NA 151* 150*  --  138 139 138  K 4.1 3.2*  --  4.0 3.8 4.0  CL 111 111  --  105 106 107  CO2 21* 28  --  18* 20* 18*  GLUCOSE 124* 100*  --  98 139* 123*  BUN 15 23*  --  12 11 19   CREATININE 0.88 0.75  --  0.71 0.57 0.67  CALCIUM 9.5 9.0  --  9.3 9.4 9.5  MG  --   --  2.3  --   --   --   PHOS 2.3*  --  3.0  --   --   --    GFR: Estimated Creatinine Clearance: 70.2 mL/min (by C-G formula based on SCr of 0.67 mg/dL). Liver Function Tests: Recent Labs  Lab 02/16/17 0614 02/17/17 0231 02/18/17 0938 02/19/17 0757 02/20/17 0844  AST 83* 32 28 25 28   ALT 71* 51 39 38 31  ALKPHOS 196* 122 116 101 97  BILITOT 1.1 0.5 0.7 0.5 0.7  PROT 8.4* 6.5 7.0 6.7 7.1  ALBUMIN 3.4* 2.4* 2.8* 2.6* 2.7*   No results for input(s): LIPASE, AMYLASE in the last 168 hours. No results for input(s): AMMONIA in the last 168 hours. Coagulation Profile: Recent Labs  Lab 02/14/17 2319 02/15/17 0342  INR 1.29 1.35   Cardiac Enzymes: No results for input(s): CKTOTAL, CKMB, CKMBINDEX, TROPONINI in the last 168 hours. BNP (last 3 results) No results for input(s): PROBNP in the last 8760 hours. HbA1C: No results for input(s): HGBA1C in the last 72 hours. CBG: Recent Labs  Lab 02/19/17 1612 02/19/17 1944 02/20/17 0018  02/20/17 0358 02/20/17 0740  GLUCAP 122* 125* 119* 107* 108*   Lipid Profile: No results for input(s): CHOL, HDL, LDLCALC, TRIG, CHOLHDL, LDLDIRECT in the last 72 hours. Thyroid Function Tests: No results for input(s): TSH, T4TOTAL, FREET4, T3FREE, THYROIDAB in the last 72 hours. Anemia Panel: No results for input(s): VITAMINB12, FOLATE, FERRITIN, TIBC, IRON, RETICCTPCT in the last 72 hours. Urine analysis:    Component  Value Date/Time   COLORURINE YELLOW 02/15/2017 0010   APPEARANCEUR HAZY (A) 02/15/2017 0010   LABSPEC 1.031 (H) 02/15/2017 0010   PHURINE 5.0 02/15/2017 0010   GLUCOSEU NEGATIVE 02/15/2017 0010   HGBUR NEGATIVE 02/15/2017 0010   BILIRUBINUR NEGATIVE 02/15/2017 0010   KETONESUR NEGATIVE 02/15/2017 0010   PROTEINUR 100 (A) 02/15/2017 0010   NITRITE NEGATIVE 02/15/2017 0010   LEUKOCYTESUR TRACE (A) 02/15/2017 0010   Sepsis Labs: @LABRCNTIP (procalcitonin:4,lacticidven:4) ) Recent Results (from the past 240 hour(s))  Culture, blood (Routine x 2)     Status: None   Collection Time: 02/14/17 11:19 PM  Result Value Ref Range Status   Specimen Description BLOOD RIGHT ANTECUBITAL  Final   Special Requests   Final    BOTTLES DRAWN AEROBIC AND ANAEROBIC Blood Culture adequate volume   Culture NO GROWTH 5 DAYS  Final   Report Status 02/20/2017 FINAL  Final  Culture, blood (Routine x 2)     Status: None   Collection Time: 02/15/17 12:14 AM  Result Value Ref Range Status   Specimen Description BLOOD LEFT ARM  Final   Special Requests   Final    BOTTLES DRAWN AEROBIC AND ANAEROBIC Blood Culture adequate volume   Culture NO GROWTH 5 DAYS  Final   Report Status 02/20/2017 FINAL  Final  Culture, blood (x 2)     Status: None   Collection Time: 02/15/17  2:38 PM  Result Value Ref Range Status   Specimen Description BLOOD LEFT ANTECUBITAL  Final   Special Requests   Final    BOTTLES DRAWN AEROBIC ONLY Blood Culture adequate volume   Culture NO GROWTH 5 DAYS  Final    Report Status 02/20/2017 FINAL  Final  MRSA PCR Screening     Status: None   Collection Time: 02/15/17  3:25 PM  Result Value Ref Range Status   MRSA by PCR NEGATIVE NEGATIVE Final    Comment:        The GeneXpert MRSA Assay (FDA approved for NASAL specimens only), is one component of a comprehensive MRSA colonization surveillance program. It is not intended to diagnose MRSA infection nor to guide or monitor treatment for MRSA infections.          Radiology Studies: Dg Chest Port 1 View  Result Date: 02/20/2017 CLINICAL DATA:  Follow-up pleural effusion. EXAM: PORTABLE CHEST 1 VIEW COMPARISON:  Chest x-rays of February 16, 2017 and January 12 172019. FINDINGS: The lungs are adequately inflated. There is no focal infiltrate. There is no pleural effusion. The heart and pulmonary vascularity are normal. The mediastinum is normal in width. There calcification in the wall of the aortic arch. The bony thorax is unremarkable. IMPRESSION: There is no active cardiopulmonary disease.  No pleural effusion. Thoracic aortic atherosclerosis. Electronically Signed   By: David  [place] M.D.   On: 02/20/2017 08:46      Scheduled Meds: . amoxicillin-clavulanate  1 tablet Oral Q12H  . bethanechol  10 mg Per Tube Q8H  . chlorhexidine  15 mL Mouth Rinse BID  . enoxaparin (LOVENOX) injection  60 mg Subcutaneous BID  . feeding supplement (PRO-STAT SUGAR FREE 64)  60 mL Per Tube TID WC  . feeding supplement (VITAL 1.5 CAL)  1,000 mL Per Tube Q24H  . free water  200 mL Per Tube Q8H  . gabapentin  300 mg Per Tube BID  . insulin aspart  0-9 Units Subcutaneous Q4H  . levETIRAcetam  500 mg Per Tube BID  . liver oil-zinc  oxide   Topical QID  . mouth rinse  15 mL Mouth Rinse q12n4p  . multivitamin  15 mL Per Tube Daily  . polyethylene glycol  17 g Per Tube Daily  . potassium chloride  40 mEq Per Tube Daily  . tiZANidine  6 mg Per Tube TID   Continuous Infusions:   LOS: 5 days    Time spent in  minutes: 35    Calvert Cantor, MD [facility] Pager: [email or web address] Password [facility] 02/20/2017, 11:39 AM

## 2017-02-20 NOTE — Progress Notes (Signed)
[phone] Paged pharmacy a couple of times for dose. Called pharmacy regarding 1400 dose of Lomotil. [name] states pharmacy should be delivering shortly.

## 2017-02-21 LAB — GLUCOSE, CAPILLARY
GLUCOSE-CAPILLARY: 124 mg/dL — AB (ref 65–99)
Glucose-Capillary: 103 mg/dL — ABNORMAL HIGH (ref 65–99)
Glucose-Capillary: 132 mg/dL — ABNORMAL HIGH (ref 65–99)
Glucose-Capillary: 144 mg/dL — ABNORMAL HIGH (ref 65–99)
Glucose-Capillary: 91 mg/dL (ref 65–99)
Glucose-Capillary: 99 mg/dL (ref 65–99)

## 2017-02-21 MED ORDER — DIPHENOXYLATE-ATROPINE 2.5-0.025 MG/5ML PO LIQD: 5.0000 mL | Freq: Four times a day (QID) | ORAL | Status: DC

## 2017-02-21 MED ORDER — DIPHENOXYLATE-ATROPINE 2.5-0.025 MG/5ML PO LIQD
10.0000 mL | Freq: Once | ORAL | Status: AC
  Administered 2017-02-21: 10 mL via ORAL
  Filled 2017-02-21: qty 10

## 2017-02-21 MED ORDER — DIPHENOXYLATE-ATROPINE 2.5-0.025 MG/5ML PO LIQD
5.0000 mL | Freq: Four times a day (QID) | ORAL | Status: DC | PRN
  Administered 2017-02-21: 5 mL via ORAL
  Filled 2017-02-21: qty 5

## 2017-02-21 NOTE — Progress Notes (Signed)
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  She presented with fever of 103 and a WBC count of 30 and lactic acidosis. She was admitted for sepsis but source was uncertain at the time on admission. Also noted to have hypernatremia and AKI.   Subjective:  non verbal    Assessment & Plan:   Principal Problem:   Sepsis  - initial suspicion was aspiration as she had acute hypoxic respiratory failure as well - CXR on 1/20 and CXR done again today both are negative for infiltrates - blood cultures negative - no UTI -  stopped antibiotics on 1/23 after completing a 7 day course  Active Problems: Diarrhea - d/c'd Miralax-  -  possibly also related to antibiotics - abdominal exam is benign -  cont Lomotil PRN  Tube feeds - tube feeds were at 20 cc/hr and were turned off by night cover provider after diarrhea occurred-  resumed at 50 cc/hr yesterday - dietician has changed the tube feed from vital to Jevity- follow - still having diarrhea  Hypernatremia AKI - improved with hydration- IVF stopped- cont free water per PEG -increased to QID  DM2 - diet control  HTN - cont Amlodipine, Metoprolol, Clonidine  H/o CVA with PEG, right hemiparesis and aphasia - lives at a SNF  Seizure disorder - cont Keppra    h/o  DVT/ PE  - cont Lovenox    DVT prophylaxis: Lovenox Code Status: no CPR but she would like intubation/ ACLS meds, cardioversion and non-invasive ventilation Family Communication:  Disposition Plan: SVF when stable Consultants:    Procedures:    Antimicrobials:  Anti-infectives (From admission, onward)   Start     Dose/Rate  Route Frequency Ordered Stop   02/18/17 1100  amoxicillin-clavulanate (AUGMENTIN) 875-125 MG per tablet 1 tablet     1 tablet Oral Every 12 hours 02/18/17 1044 02/20/17 2025   02/15/17 2200  vancomycin (VANCOCIN) IVPB 1000 mg/200 mL premix  Status:  Discontinued     1,000 mg 200 mL/hr over 60 Minutes Intravenous Every 24 hours 02/15/17 0051 02/17/17 0953   02/15/17 1930  piperacillin-tazobactam (ZOSYN) IVPB 3.375 g  Status:  Discontinued     3.375 g 12.5 mL/hr over 240 Minutes Intravenous Every 8 hours 02/15/17 1921 02/18/17 1044   02/15/17 0600  piperacillin-tazobactam (ZOSYN) IVPB 3.375 g  Status:  Discontinued     3.375 g 12.5 mL/hr over 240 Minutes Intravenous Every 8 hours 02/15/17 0051 02/15/17 1921   02/14/17 2330  piperacillin-tazobactam (ZOSYN) IVPB 3.375 g     3.375 g 100 mL/hr over 30 Minutes Intravenous  Once 02/14/17 2321 02/15/17 0054   02/14/17 2330  vancomycin (VANCOCIN) IVPB 1000 mg/200 mL premix     1,000 mg 200 mL/hr over 60 Minutes Intravenous  Once 02/14/17 2321 02/15/17 0303       Objective: Vitals:   02/20/17 1443 02/20/17 2011 02/21/17 0429 02/21/17 0700  BP: 136/83 132/84 129/80 (!) 144/122  Pulse: (!) 121 (!) 110 (!) 102 94  Resp: 16 17 18 20   Temp: (!) 100.7 F (38.2 C) 99 F (37.2 C) 98.8 F (37.1 C) 98.3 F (36.8 C)  TempSrc: Oral Oral Oral Oral  SpO2: 100% 99% 100% 99%  Weight:   60.6 kg (133 lb 9.6 oz)   Height:        Intake/Output Summary (Last 24 hours) at 02/21/2017 1411 Last data filed at 02/21/2017 0430 Gross per 24 hour  Intake 0 ml  Output 1250 ml  Net -1250 ml   Filed Weights   02/19/17 0458 02/20/17 0459 02/21/17 0429  Weight: 59.5 kg (131 lb 2.8 oz) 61.7 kg (136 lb 0.4 oz) 60.6 kg (133 lb 9.6 oz)    Examination: General exam: Appears comfortable  HEENT: PERRLA, oral mucosa moist, no sclera icterus or thrush Respiratory system: Clear to auscultation. Respiratory effort normal. Cardiovascular system: S1 & S2 heard, RRR.  No  murmurs  Gastrointestinal system: Abdomen soft, non-tender, nondistended. Normal bowel sound. No organomegaly- PEG intact- watery stool in rectal tube/bag Central nervous system: Alert and oriented. No focal neurological deficits. Extremities: No cyanosis, clubbing or edema Skin: No rashes or ulcers   Data Reviewed: I have personally reviewed following labs and imaging studies  CBC: Recent Labs  Lab 02/15/17 0342 02/16/17 0614 02/17/17 0231 02/18/17 0938 02/19/17 0806 02/20/17 0844  WBC 32.5* 25.5* 18.4* 13.0* 10.3 12.6*  NEUTROABS 25.0* 21.9* 12.4*  --  7.6 8.5*  HGB 11.1* 12.7 9.4* 10.4* 10.6* 10.5*  HCT 37.3 41.1 31.6* 33.8* 33.6* 33.0*  MCV 100.3* 99.0 97.8 95.2 94.1 94.8  PLT 216 242 214 239 243 320   Basic Metabolic Panel: Recent Labs  Lab 02/16/17 0614 02/17/17 0231 02/17/17 0319 02/18/17 0938 02/19/17 0757 02/20/17 0844  NA 151* 150*  --  138 139 138  K 4.1 3.2*  --  4.0 3.8 4.0  CL 111 111  --  105 106 107  CO2 21* 28  --  18* 20* 18*  GLUCOSE 124* 100*  --  98 139* 123*  BUN 15 23*  --  12 11 19   CREATININE 0.88 0.75  --  0.71 0.57 0.67  CALCIUM 9.5 9.0  --  9.3 9.4 9.5  MG  --   --  2.3  --   --   --   PHOS 2.3*  --  3.0  --   --   --    GFR: Estimated Creatinine Clearance: 70.2 mL/min (by C-G formula based on SCr of 0.67 mg/dL). Liver Function Tests: Recent Labs  Lab 02/16/17 0614 02/17/17 0231 02/18/17 0938 02/19/17 0757 02/20/17 0844  AST 83* 32 28 25 28   ALT 71* 51 39 38 31  ALKPHOS 196* 122 116 101 97  BILITOT 1.1 0.5 0.7 0.5 0.7  PROT 8.4* 6.5 7.0 6.7 7.1  ALBUMIN 3.4* 2.4* 2.8* 2.6* 2.7*   No results for input(s): LIPASE, AMYLASE in the last 168 hours. No results for input(s): AMMONIA in the last 168 hours. Coagulation Profile: Recent Labs  Lab 02/14/17 2319 02/15/17 0342  INR 1.29 1.35   Cardiac Enzymes: No results for input(s): CKTOTAL, CKMB, CKMBINDEX, TROPONINI in the last 168 hours. BNP (last 3 results) No results for  input(s): PROBNP in the last 8760 hours. HbA1C: No results for input(s): HGBA1C in the last 72 hours. CBG: Recent Labs  Lab 02/20/17 1955 02/21/17 0031 02/21/17 0426 02/21/17 0810 02/21/17 1223  GLUCAP 125* 144* 103* 99 132*   Lipid Profile: No results for input(s): CHOL, HDL, LDLCALC, TRIG, CHOLHDL, LDLDIRECT in the last 72 hours. Thyroid Function Tests: No results for input(s): TSH, T4TOTAL, FREET4, T3FREE, THYROIDAB in the last 72  hours. Anemia Panel: No results for input(s): VITAMINB12, FOLATE, FERRITIN, TIBC, IRON, RETICCTPCT in the last 72 hours. Urine analysis:    Component Value Date/Time   COLORURINE YELLOW 02/15/2017 0010   APPEARANCEUR HAZY (A) 02/15/2017 0010   LABSPEC 1.031 (H) 02/15/2017 0010   PHURINE 5.0 02/15/2017 0010   GLUCOSEU NEGATIVE 02/15/2017 0010   HGBUR NEGATIVE 02/15/2017 0010   BILIRUBINUR NEGATIVE 02/15/2017 0010   KETONESUR NEGATIVE 02/15/2017 0010   PROTEINUR 100 (A) 02/15/2017 0010   NITRITE NEGATIVE 02/15/2017 0010   LEUKOCYTESUR TRACE (A) 02/15/2017 0010   Sepsis Labs: @LABRCNTIP (procalcitonin:4,lacticidven:4) ) Recent Results (from the past 240 hour(s))  Culture, blood (Routine x 2)     Status: None   Collection Time: 02/14/17 11:19 PM  Result Value Ref Range Status   Specimen Description BLOOD RIGHT ANTECUBITAL  Final   Special Requests   Final    BOTTLES DRAWN AEROBIC AND ANAEROBIC Blood Culture adequate volume   Culture NO GROWTH 5 DAYS  Final   Report Status 02/20/2017 FINAL  Final  Culture, blood (Routine x 2)     Status: None   Collection Time: 02/15/17 12:14 AM  Result Value Ref Range Status   Specimen Description BLOOD LEFT ARM  Final   Special Requests   Final    BOTTLES DRAWN AEROBIC AND ANAEROBIC Blood Culture adequate volume   Culture NO GROWTH 5 DAYS  Final   Report Status 02/20/2017 FINAL  Final  Culture, blood (x 2)     Status: None   Collection Time: 02/15/17  2:38 PM  Result Value Ref Range Status   Specimen  Description BLOOD LEFT ANTECUBITAL  Final   Special Requests   Final    BOTTLES DRAWN AEROBIC ONLY Blood Culture adequate volume   Culture NO GROWTH 5 DAYS  Final   Report Status 02/20/2017 FINAL  Final  MRSA PCR Screening     Status: None   Collection Time: 02/15/17  3:25 PM  Result Value Ref Range Status   MRSA by PCR NEGATIVE NEGATIVE Final    Comment:        The GeneXpert MRSA Assay (FDA approved for NASAL specimens only), is one component of a comprehensive MRSA colonization surveillance program. It is not intended to diagnose MRSA infection nor to guide or monitor treatment for MRSA infections.          Radiology Studies: Dg Chest Port 1 View  Result Date: 02/20/2017 CLINICAL DATA:  Follow-up pleural effusion. EXAM: PORTABLE CHEST 1 VIEW COMPARISON:  Chest x-rays of February 16, 2017 and January 12 172019. FINDINGS: The lungs are adequately inflated. There is no focal infiltrate. There is no pleural effusion. The heart and pulmonary vascularity are normal. The mediastinum is normal in width. There calcification in the wall of the aortic arch. The bony thorax is unremarkable. IMPRESSION: There is no active cardiopulmonary disease.  No pleural effusion. Thoracic aortic atherosclerosis. Electronically Signed   By: David  [place] M.D.   On: 02/20/2017 08:46      Scheduled Meds: . bethanechol  10 mg Per Tube Q8H  . chlorhexidine  15 mL Mouth Rinse BID  . enoxaparin (LOVENOX) injection  60 mg Subcutaneous BID  . feeding supplement (PRO-STAT SUGAR FREE 64)  30 mL Per Tube TID WC  . free water  200 mL Per Tube QID  . gabapentin  300 mg Per Tube BID  . insulin aspart  0-9 Units Subcutaneous Q4H  . levETIRAcetam  500 mg Per Tube BID  .  liver oil-zinc oxide   Topical QID  . mouth rinse  15 mL Mouth Rinse q12n4p  . multivitamin  15 mL Per Tube Daily  . potassium chloride  40 mEq Per Tube Daily  . tiZANidine  6 mg Per Tube TID   Continuous Infusions: . feeding supplement  (JEVITY 1.5 CAL/FIBER) 1,000 mL (02/20/17 1229)     LOS: 6 days    Time spent in minutes: 35    Calvert Cantor, MD [facility] Pager: [email or web address] Password [facility] 02/21/2017, 2:11 PM

## 2017-02-22 ENCOUNTER — Inpatient Hospital Stay (HOSPITAL_COMMUNITY): Payer: Medicaid Other

## 2017-02-22 LAB — URINALYSIS, ROUTINE W REFLEX MICROSCOPIC
BILIRUBIN URINE: NEGATIVE
Glucose, UA: NEGATIVE mg/dL
KETONES UR: NEGATIVE mg/dL
LEUKOCYTES UA: NEGATIVE
NITRITE: NEGATIVE
PROTEIN: NEGATIVE mg/dL
Specific Gravity, Urine: >1.046 — ABNORMAL HIGH (ref 1.005–1.030)
pH: 7 (ref 5.0–8.0)

## 2017-02-22 LAB — BASIC METABOLIC PANEL
Anion gap: 13 (ref 5–15)
BUN: 16 mg/dL (ref 6–20)
CHLORIDE: 103 mmol/L (ref 101–111)
CO2: 22 mmol/L (ref 22–32)
Calcium: 9.9 mg/dL (ref 8.9–10.3)
Creatinine, Ser: 0.58 mg/dL (ref 0.44–1.00)
GFR calc Af Amer: >60 mL/min (ref >60)
GFR calc non Af Amer: >60 mL/min (ref >60)
GLUCOSE: 102 mg/dL — AB (ref 65–99)
Potassium: 4 mmol/L (ref 3.5–5.1)
Sodium: 138 mmol/L (ref 135–145)

## 2017-02-22 LAB — CBC
HEMATOCRIT: 33.6 % — AB (ref 36.0–46.0)
HEMOGLOBIN: 10.5 g/dL — AB (ref 12.0–15.0)
MCH: 29.6 pg (ref 26.0–34.0)
MCHC: 31.3 g/dL (ref 30.0–36.0)
MCV: 94.6 fL (ref 78.0–100.0)
Platelets: 424 10*3/uL — ABNORMAL HIGH (ref 150–400)
RBC: 3.55 MIL/uL — ABNORMAL LOW (ref 3.87–5.11)
RDW: 15.3 % (ref 11.5–15.5)
WBC: 13.3 10*3/uL — ABNORMAL HIGH (ref 4.0–10.5)

## 2017-02-22 LAB — GLUCOSE, CAPILLARY
Glucose-Capillary: 118 mg/dL — ABNORMAL HIGH (ref 65–99)
Glucose-Capillary: 127 mg/dL — ABNORMAL HIGH (ref 65–99)
Glucose-Capillary: 134 mg/dL — ABNORMAL HIGH (ref 65–99)
Glucose-Capillary: 166 mg/dL — ABNORMAL HIGH (ref 65–99)

## 2017-02-22 MED ORDER — JEVITY 1.5 CAL/FIBER PO LIQD: 1000.0000 mL | ORAL | Status: DC

## 2017-02-22 MED ORDER — DEXTROSE 5 % IV SOLN
1.0000 g | INTRAVENOUS | Status: DC
  Administered 2017-02-22 – 2017-02-23 (×2): 1 g via INTRAVENOUS
  Filled 2017-02-22 (×4): qty 10

## 2017-02-22 MED ORDER — ZINC OXIDE 40 % EX OINT: TOPICAL_OINTMENT | Freq: Four times a day (QID) | CUTANEOUS | 0 refills | Status: DC

## 2017-02-22 MED ORDER — PRO-STAT SUGAR FREE PO LIQD: 60.0000 mL | Freq: Three times a day (TID) | ORAL | 0 refills | Status: DC

## 2017-02-22 MED ORDER — LEVOFLOXACIN 750 MG PO TABS: 750.0000 mg | ORAL_TABLET | Freq: Every day | ORAL | Status: DC

## 2017-02-22 MED ORDER — WHITE PETROLATUM EX OINT
TOPICAL_OINTMENT | CUTANEOUS | Status: AC
  Filled 2017-02-22: qty 28.35

## 2017-02-22 MED ORDER — FREE WATER: 200.0000 mL | Freq: Four times a day (QID) | Status: DC

## 2017-02-22 MED ORDER — SODIUM CHLORIDE 0.9 % IV SOLN
INTRAVENOUS | Status: DC
  Administered 2017-02-22 – 2017-02-23 (×2): via INTRAVENOUS
  Administered 2017-02-23: 100 mL/h via INTRAVENOUS
  Administered 2017-02-24: 1 mL via INTRAVENOUS
  Administered 2017-02-24: 11:00:00 via INTRAVENOUS

## 2017-02-22 MED ORDER — OXYCODONE HCL 5 MG PO TABS: 5.0000 mg | ORAL_TABLET | ORAL | 0 refills | Status: DC | PRN

## 2017-02-22 MED ORDER — FREE WATER
200.0000 mL | Freq: Four times a day (QID) | Status: DC
  Administered 2017-02-22 – 2017-02-24 (×7): 200 mL

## 2017-02-22 MED ORDER — IOPAMIDOL (ISOVUE-300) INJECTION 61%
INTRAVENOUS | Status: AC
  Filled 2017-02-22: qty 30

## 2017-02-22 MED ORDER — IOPAMIDOL (ISOVUE-300) INJECTION 61%
INTRAVENOUS | Status: AC
  Administered 2017-02-22: 100 mL
  Filled 2017-02-22: qty 100

## 2017-02-22 NOTE — Progress Notes (Addendum)
Pharmacy Antibiotic Note  Rachel Vang is a 52 y.o. female admitted on 02/14/2017 with pneumonia.  Pharmacy has been consulted for levaquin dosing.  Recently on vancomycin , zosyn and augmentin.  WBC up 12.6> 13.3 and pharmacy asked to start levaquin.  Plan: 1. Levaquin 750 mg po q 24 hrs. 2. If UA/UCx negative, could consider narrowing quickly to cephalosporin?  Addendum > spoke to Dr. Butler Denmarkizwan, will use ceftriaxone for now with plans to d/c on oral cephalosporin.  Height: 5\' 4"  (162.6 cm) Weight: 135 lb 5.8 oz (61.4 kg) IBW/kg (Calculated) : 54.7  Temp (24hrs), Avg:98.1 F (36.7 C), Min:98 F (36.7 C), Max:98.3 F (36.8 C)  Recent Labs  Lab 02/16/17 0828 02/16/17 1655 02/17/17 0231 02/18/17 [id number] 02/19/17 0757 02/19/17 0806 02/20/17 0844 02/22/17 0520  WBC  --   --  18.4* 13.0*  --  10.3 12.6* 13.3*  CREATININE  --   --  0.75 0.71 0.57  --  0.67 0.58  LATICACIDVEN 4.6* 1.6  --   --   --   --   --   --     Estimated Creatinine Clearance: 70.2 mL/min (by C-G formula based on SCr of 0.58 mg/dL).    No Known Allergies  Antimicrobials this admission: Vanc 1/18>>1/20 Zosyn 1/18>> 1/21 Augmentin 1/21 >> 1/23  Dose adjustments this admission:  Microbiology results: 1/18 MRSA PCR - neg  1/18 blood x 2 - neg 1/17: BC x 1: Neg 1/17 Influenza panel - negative 1/18 HIV: non-reactive  Thank you for allowing pharmacy to be a part of this patient's care.  Tad MooreJessica Quantia Grullon, Pharm D, BCPS  Clinical Pharmacist Pager [phone]  02/22/2017 4:41 PM

## 2017-02-22 NOTE — Progress Notes (Signed)
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  She presented with fever of 103 and a WBC count of 30 and lactic acidosis. She was admitted for sepsis but source was uncertain at the time on admission. Also noted to have hypernatremia and AKI.   Subjective:  non verbal    Assessment & Plan:   Principal Problem:   Sepsis  - initial suspicion was aspiration as she had acute hypoxic respiratory failure as well - CXR on 1/20 and CXR done again today both are negative for infiltrates - blood cultures negative - no UTI -  stopped antibiotics on 1/23 after completing a 7 day course of Zosyn followed by Augmentin -  however WBC counts are rising since stopping antibiotics - CT scan of abd/pelvis shows a RLL pneumonia and findings consistent with a cystitis- will obtain clean catch UA- start Levaquin  Active Problems: Diarrhea   - she began to have recurrent diarrhea a few days ago- she was receiving daily Miralax and this was discontinued which resulted in resolution of the diarrhea.  - she is not complaining of abdominal pain- abdomen is non-tender on exam - She is now having soft stools but is incontinent of stool  - we are applying barrier cream and recommend she be checked on frequently at the SNF for stool to prevent further skin breakdown. She is not able to call for help after she has had a BM and is often sitting in her stool for long periods of time  Tube feeds - tube feeds were at 20 cc/hr and were turned off by night cover provider after diarrhea occurred-  resumed at 50 cc/hr yesterday - dietician has changed the tube feed from vital to Jevity-  follow -    Hypernatremia AKI - improved with hydration- IVF stopped- cont free water per PEG -increased to QID  DM2 - diet control  HTN - cont Amlodipine, Metoprolol, Clonidine  H/o CVA with PEG, right hemiparesis and aphasia - lives at a SNF  Seizure disorder - cont Keppra    h/o  DVT/ PE  - cont Lovenox    DVT prophylaxis: Lovenox Code Status: no CPR but she would like intubation/ ACLS meds, cardioversion and non-invasive ventilation Family Communication:  Disposition Plan: SVF when stable Consultants:    Procedures:    Antimicrobials:  Anti-infectives (From admission, onward)   Start     Dose/Rate Route Frequency Ordered Stop   02/18/17 1100  amoxicillin-clavulanate (AUGMENTIN) 875-125 MG per tablet 1 tablet     1 tablet Oral Every 12 hours 02/18/17 1044 02/20/17 2025   02/15/17 2200  vancomycin (VANCOCIN) IVPB 1000 mg/200 mL premix  Status:  Discontinued     1,000 mg 200 mL/hr over 60 Minutes Intravenous Every 24 hours 02/15/17 0051 02/17/17 0953   02/15/17 1930  piperacillin-tazobactam (ZOSYN) IVPB 3.375 g  Status:  Discontinued     3.375 g 12.5 mL/hr over 240 Minutes Intravenous Every 8 hours 02/15/17 1921 02/18/17 1044   02/15/17 0600  piperacillin-tazobactam (ZOSYN) IVPB 3.375 g  Status:  Discontinued     3.375 g 12.5 mL/hr over 240 Minutes Intravenous Every 8 hours 02/15/17 0051  02/15/17 1921   02/14/17 2330  piperacillin-tazobactam (ZOSYN) IVPB 3.375 g     3.375 g 100 mL/hr over 30 Minutes Intravenous  Once 02/14/17 2321 02/15/17 0054   02/14/17 2330  vancomycin (VANCOCIN) IVPB 1000 mg/200 mL premix     1,000 mg 200 mL/hr over 60 Minutes Intravenous  Once 02/14/17 2321 02/15/17 0303       Objective: Vitals:   02/21/17 1400 02/21/17 2049 02/22/17 0416 02/22/17 1250  BP: (!) 125/97 124/80 126/81 109/88  Pulse: (!) 114 (!) 101 98 (!) 101  Resp: (!) 28 (!) 21 20 18   Temp: 99.3 F (37.4 C) 98 F (36.7 C) 98.3 F (36.8 C) 98 F (36.7 C)  TempSrc:  Oral Oral Oral Oral  SpO2:  97% 98% 99%  Weight:   61.4 kg (135 lb 5.8 oz)   Height:        Intake/Output Summary (Last 24 hours) at 02/22/2017 1555 Last data filed at 02/22/2017 1313 Gross per 24 hour  Intake 1200 ml  Output 600 ml  Net 600 ml   Filed Weights   02/20/17 0459 02/21/17 0429 02/22/17 0416  Weight: 61.7 kg (136 lb 0.4 oz) 60.6 kg (133 lb 9.6 oz) 61.4 kg (135 lb 5.8 oz)    Examination: General exam: Appears comfortable  HEENT: PERRLA, oral mucosa moist, no sclera icterus or thrush Respiratory system: Clear to auscultation. Respiratory effort normal. Cardiovascular system: S1 & S2 heard, RRR.  No murmurs  Gastrointestinal system: Abdomen soft, non-tender, nondistended. Normal bowel sound. No organomegaly- PEG intact- watery stool in rectal tube/bag Central nervous system: Alert and oriented. No focal neurological deficits. Extremities: No cyanosis, clubbing or edema Skin: No rashes or ulcers   Data Reviewed: I have personally reviewed following labs and imaging studies  CBC: Recent Labs  Lab 02/16/17 0614 02/17/17 0231 02/18/17 0938 02/19/17 0806 02/20/17 0844 02/22/17 0520  WBC 25.5* 18.4* 13.0* 10.3 12.6* 13.3*  NEUTROABS 21.9* 12.4*  --  7.6 8.5*  --   HGB 12.7 9.4* 10.4* 10.6* 10.5* 10.5*  HCT 41.1 31.6* 33.8* 33.6* 33.0* 33.6*  MCV 99.0 97.8 95.2 94.1 94.8 94.6  PLT 242 214 239 243 320 424*   Basic Metabolic Panel: Recent Labs  Lab 02/16/17 0614 02/17/17 0231 02/17/17 0319 02/18/17 0938 02/19/17 0757 02/20/17 0844 02/22/17 0520  NA 151* 150*  --  138 139 138 138  K 4.1 3.2*  --  4.0 3.8 4.0 4.0  CL 111 111  --  105 106 107 103  CO2 21* 28  --  18* 20* 18* 22  GLUCOSE 124* 100*  --  98 139* 123* 102*  BUN 15 23*  --  12 11 19 16   CREATININE 0.88 0.75  --  0.71 0.57 0.67 0.58  CALCIUM 9.5 9.0  --  9.3 9.4 9.5 9.9  MG  --   --  2.3  --   --   --   --   PHOS 2.3*  --  3.0  --   --   --   --    GFR: Estimated Creatinine Clearance: 70.2  mL/min (by C-G formula based on SCr of 0.58 mg/dL). Liver Function Tests: Recent Labs  Lab 02/16/17 0614 02/17/17 0231 02/18/17 0938 02/19/17 0757 02/20/17 0844  AST 83* 32 28 25 28   ALT 71* 51 39 38 31  ALKPHOS 196* 122 116 101 97  BILITOT 1.1 0.5 0.7 0.5 0.7  PROT 8.4* 6.5 7.0 6.7 7.1  ALBUMIN 3.4* 2.4*  2.8* 2.6* 2.7*   No results for input(s): LIPASE, AMYLASE in the last 168 hours. No results for input(s): AMMONIA in the last 168 hours. Coagulation Profile: No results for input(s): INR, PROTIME in the last 168 hours. Cardiac Enzymes: No results for input(s): CKTOTAL, CKMB, CKMBINDEX, TROPONINI in the last 168 hours. BNP (last 3 results) No results for input(s): PROBNP in the last 8760 hours. HbA1C: No results for input(s): HGBA1C in the last 72 hours. CBG: Recent Labs  Lab 02/21/17 2025 02/22/17 0012 02/22/17 0419 02/22/17 0758 02/22/17 1215  GLUCAP 124* 166* 134* 127* 118*   Lipid Profile: No results for input(s): CHOL, HDL, LDLCALC, TRIG, CHOLHDL, LDLDIRECT in the last 72 hours. Thyroid Function Tests: No results for input(s): TSH, T4TOTAL, FREET4, T3FREE, THYROIDAB in the last 72 hours. Anemia Panel: No results for input(s): VITAMINB12, FOLATE, FERRITIN, TIBC, IRON, RETICCTPCT in the last 72 hours. Urine analysis:    Component Value Date/Time   COLORURINE YELLOW 02/15/2017 0010   APPEARANCEUR HAZY (A) 02/15/2017 0010   LABSPEC 1.031 (H) 02/15/2017 0010   PHURINE 5.0 02/15/2017 0010   GLUCOSEU NEGATIVE 02/15/2017 0010   HGBUR NEGATIVE 02/15/2017 0010   BILIRUBINUR NEGATIVE 02/15/2017 0010   KETONESUR NEGATIVE 02/15/2017 0010   PROTEINUR 100 (A) 02/15/2017 0010   NITRITE NEGATIVE 02/15/2017 0010   LEUKOCYTESUR TRACE (A) 02/15/2017 0010   Sepsis Labs: @LABRCNTIP (procalcitonin:4,lacticidven:4) ) Recent Results (from the past 240 hour(s))  Culture, blood (Routine x 2)     Status: None   Collection Time: 02/14/17 11:19 PM  Result Value Ref Range Status     Specimen Description BLOOD RIGHT ANTECUBITAL  Final   Special Requests   Final    BOTTLES DRAWN AEROBIC AND ANAEROBIC Blood Culture adequate volume   Culture NO GROWTH 5 DAYS  Final   Report Status 02/20/2017 FINAL  Final  Culture, blood (Routine x 2)     Status: None   Collection Time: 02/15/17 12:14 AM  Result Value Ref Range Status   Specimen Description BLOOD LEFT ARM  Final   Special Requests   Final    BOTTLES DRAWN AEROBIC AND ANAEROBIC Blood Culture adequate volume   Culture NO GROWTH 5 DAYS  Final   Report Status 02/20/2017 FINAL  Final  Culture, blood (x 2)     Status: None   Collection Time: 02/15/17  2:38 PM  Result Value Ref Range Status   Specimen Description BLOOD LEFT ANTECUBITAL  Final   Special Requests   Final    BOTTLES DRAWN AEROBIC ONLY Blood Culture adequate volume   Culture NO GROWTH 5 DAYS  Final   Report Status 02/20/2017 FINAL  Final  MRSA PCR Screening     Status: None   Collection Time: 02/15/17  3:25 PM  Result Value Ref Range Status   MRSA by PCR NEGATIVE NEGATIVE Final    Comment:        The GeneXpert MRSA Assay (FDA approved for NASAL specimens only), is one component of a comprehensive MRSA colonization surveillance program. It is not intended to diagnose MRSA infection nor to guide or monitor treatment for MRSA infections.          Radiology Studies: Ct Abdomen Pelvis W Contrast  Result Date: 02/22/2017 CLINICAL DATA:  Unspecified abdominal pain, leukocytosis, fever 103 degrees, history type II diabetes mellitus, hypertension, recurrent UTIs and urinary retention, prior DVT and PE EXAM: CT ABDOMEN AND PELVIS WITH CONTRAST TECHNIQUE: Multidetector CT imaging of the abdomen and pelvis was performed using  the standard protocol following bolus administration of intravenous contrast. Sagittal and coronal MPR images reconstructed from axial data set. Scattered respiratory motion artifacts. CONTRAST:  ISOVUE-300 IOPAMIDOL (ISOVUE-300)  INJECTION 61% IV. No oral contrast. COMPARISON:  05/13/2015 FINDINGS: Lower chest: RIGHT lower lobe infiltrate Hepatobiliary: Contracted gallbladder.  Liver normal appearance Pancreas: Normal appearance Spleen: Small capsular calcification. No additional focal splenic abnormalities. Small splenule. Adrenals/Urinary Tract: Adrenal glands, kidneys, and ureters normal appearance. Diffuse bladder wall thickening with hazy perivesicular margins compatible with cystitis. Foley catheter within bladder lumen. Stomach/Bowel: Gastrostomy tube at mid stomach. Minimal sigmoid diverticulosis. Stomach and bowel loops otherwise normal appearance. Vascular/Lymphatic: Atherosclerotic calcifications aorta and iliac arteries. Aorta normal caliber. No adenopathy. Reproductive: Uterus surgically absent.  Normal sized ovaries. Other: No free air or free fluid. No definite hernia. Foci of subcutaneous infiltration in the anterior abdominal wall question sites of medication injection. Musculoskeletal: Bones demineralized. IMPRESSION: Bladder wall thickening with hazy irregular perivesicular margins compatible with cystitis; recommend correlation with urinalysis. Minimal sigmoid diverticulosis. RIGHT lower lobe infiltrate question pneumonia or aspiration. No definite acute intra-abdominal or intrapelvic abnormalities otherwise seen. Aortic Atherosclerosis (ICD10-I70.0). Electronically Signed   By: Ulyses Southward M.D.   On: 02/22/2017 15:45      Scheduled Meds: . bethanechol  10 mg Per Tube Q8H  . chlorhexidine  15 mL Mouth Rinse BID  . enoxaparin (LOVENOX) injection  60 mg Subcutaneous BID  . feeding supplement (PRO-STAT SUGAR FREE 64)  30 mL Per Tube TID WC  . free water  200 mL Per Tube QID  . gabapentin  300 mg Per Tube BID  . insulin aspart  0-9 Units Subcutaneous Q4H  . iopamidol      . levETIRAcetam  500 mg Per Tube BID  . liver oil-zinc oxide   Topical QID  . mouth rinse  15 mL Mouth Rinse q12n4p  . multivitamin  15 mL  Per Tube Daily  . potassium chloride  40 mEq Per Tube Daily  . tiZANidine  6 mg Per Tube TID   Continuous Infusions: . feeding supplement (JEVITY 1.5 CAL/FIBER) 1,000 mL (02/20/17 1229)     LOS: 7 days    Time spent in minutes: 35    Calvert Cantor, MD [facility] Pager: [email or web address] Password [facility] 02/22/2017, 3:55 PM

## 2017-02-22 NOTE — Discharge Summary (Signed)
Physician Discharge Summary  [name]:[id number] DOB: 08/07/1965 DOA: 02/14/2017  PCP: Patient, No Pcp Per  Admit date: 02/14/2017 Discharge date: 02/24/2017  Admitted From: SNF Disposition:  SNF   Recommendations for Outpatient Follow-up:  1. Please continue to monitor temp at least QID for 3 days to ensure fevers are not recurring 2. Bmet and CBC on Monday 3. D/c Ceftin in 5-7 days based on progress 4.         F/u on stool output- she is incontinent and lays in her stool if she is not checked- check at least QID for stool - ensure barrier cream is being applied after each Bowel movement to prevent skin breakdown  Discharge Condition:  stable   CODE STATUS:  Partial code- does not want CPR but wants all other measures (this code status was decided by the patient and the ICU attending) Diet recommendation:  Tube feeds- see orders Consultations:  none    Discharge Diagnoses:  Principal Problem:   Sepsis /  Fever  Active Problems: RLL pneumonia-  aspiration vs HCAP diarrhea    Seizures     DVT (deep venous thrombosis) (HCC)   Type II diabetes mellitus with neurological manifestations (HCC)   Hemiparesis affecting right side as late effect of stroke (HCC)   Pressure injury of skin   Subjective: Non-verbal  Brief Summary: Rachel Vang is a 52 y/o with 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  She presented with fever of 103, a WBC count of 30 and lactic acidosis. Also noted to have hypernatremia and AKI on admission. She was admitted for sepsis but source was uncertain at the time on admission. She was hypoxic but CXR was clear. She had admitted to loose stools and abdominal pain but this was noted to  improved in the initial days of the hospital stay.  On 1/18, she was noted to have cough/ congestion and was requiring frequent suctioning, she was transferred to the ICU for this but did not need intubation or pressors. She was quite dehydrated and received aggressive fluid resuscitation.   She was transferred back to the hospitalist service on 1/20.    Hospital Course:  Sepsis  - initial suspicion was aspiration as she had acute hypoxic respiratory failure as well however CXR on 1/20 and 1/23 are both were both negative for infiltrates - blood cultures negative - UA was negative -  stopped antibiotics on 1/23 after completing a 7 day course of Zosyn followed by Augmentin however, her WBC count began to rise and she became febrile again- at this piont a CT scan was done to help determine the source of fever and revealed she had a RLL inf iltrate consistent with pneumonia- she was started on Rocephin and leukocytosis and fevers have resolved- will switch to Ceftin for 5-7 more days  Active Problems: Diarrhea - she began to have recurrent diarrhea a few days ago- she was receiving daily Miralax and this was discontinued which resulted in resolution of the diarrhea.  - however with resumption of antibiotics ([place]), her loose stools have recurred and we are using anti motility agent to control diarrhea- tube feed has also been changed  - we are applying barrier cream and recommend I she be checked on  frequently at the SNF for stool to prevent further skin breakdown. She is not able to call for help due to being nonverbal and is often sitting in her stool for long periods of time   Tube feeds - dietician has changed the tube feed from Vital to Jevity which she is tolerating well without diarrhea - will continue this  Hypernatremia AKI - improved with hydration- IVF stopped- cont free water per PEG QID  DM2 - diet control  HTN - cont Amlodipine, Metoprolol, Clonidine  Seizure  disorder - cont Keppra    h/o  DVT/ PE  - cont Lovenox  H/o CVA with PEG, right hemiparesis and aphasia - lives at a SNF   Discharge Exam: Vitals:   02/23/17 2204 02/24/17 0404  BP: 106/78 112/88  Pulse: (!) 105 (!) 106  Resp: 16 16  Temp: 98.1 F (36.7 C) 98.8 F (37.1 C)  SpO2: 97% 100%   Vitals:   02/23/17 0345 02/23/17 1315 02/23/17 2204 02/24/17 0404  BP: 121/76 117/80 106/78 112/88  Pulse: (!) 113 (!) 101 (!) 105 (!) 106  Resp: 18 15 16 16   Temp: 98.4 F (36.9 C) 99.5 F (37.5 C) 98.1 F (36.7 C) 98.8 F (37.1 C)  TempSrc: Oral Oral Oral Oral  SpO2: 100% 99% 97% 100%  Weight: 60.2 kg (132 lb 11.5 oz)   62.9 kg (138 lb 10.7 oz)  Height:        General: Pt is alert, awake, not in acute distress Cardiovascular: RRR, S1/S2 +, no rubs, no gallops Respiratory: CTA bilaterally, no wheezing, no rhonchi Abdominal: Soft, NT, ND, bowel sounds +, PEG tube is intact, tube feeds running Extremities: no edema, no cyanosis   Discharge Instructions  Discharge Instructions    Increase activity slowly   Complete by:  As directed    Increase activity slowly   Complete by:  As directed      Allergies as of 02/24/2017   No Known Allergies     Medication List    STOP taking these medications   amLODipine 5 MG tablet Commonly known as:  NORVASC   cloNIDine 0.1 MG tablet Commonly known as:  CATAPRES   hydrocortisone cream 1 %   metoprolol tartrate 100 MG tablet Commonly known as:  LOPRESSOR   metoprolol tartrate 25 MG tablet Commonly known as:  LOPRESSOR   oseltamivir 75 MG capsule Commonly known as:  TAMIFLU   tizanidine 6 MG capsule Commonly known as:  ZANAFLEX   UNABLE TO FIND     TAKE these medications   acetaminophen 325 MG tablet Commonly known as:  TYLENOL Place 650 mg into feeding tube daily. And every 4 hours as needed for mild pain,fever   bethanechol 10 MG tablet Commonly known as:  URECHOLINE 10 mg by PEG Tube route every 8 (eight)  hours.   cefUROXime 250 MG/5ML suspension Commonly known as:  CEFTIN Take 10 mLs (500 mg total) by mouth 2 (two) times daily for 10 days.   DECUBI-VITE Caps Place 1 capsule into feeding tube daily.   feeding supplement (JEVITY 1.5 CAL/FIBER) Liqd Place 1,000 mLs into feeding tube continuous.   feeding supplement (PRO-STAT SUGAR FREE 64) Liqd Place 60 mLs into feeding tube 3 (three) times daily with meals.   free water Soln Place 200 mLs into feeding tube 4 (four) times daily.   gabapentin 100 MG capsule Commonly known as:  NEURONTIN Take 300 mg by mouth 2 (two) times daily. Hold if [employer]  levETIRAcetam 100 MG/ML solution Commonly known as:  KEPPRA Place 5 mLs (500 mg total) into feeding tube 2 (two) times daily.   liver oil-zinc oxide 40 % ointment Commonly known as:  DESITIN Apply topically 4 (four) times daily.   loperamide 2 MG tablet Commonly known as:  IMODIUM A-D Take 1 tablet (2 mg total) by mouth 4 (four) times daily as needed for diarrhea or loose stools.   LOVENOX 60 MG/0.6ML injection Generic drug:  enoxaparin Inject 60 mg into the skin 2 (two) times daily.   ondansetron 4 MG tablet Commonly known as:  ZOFRAN Take 4 mg by mouth daily.   oxyCODONE 5 MG immediate release tablet Commonly known as:  ROXICODONE Place 1 tablet (5 mg total) into feeding tube every 4 (four) hours as needed for severe pain. What changed:  Another medication with the same name was removed. Continue taking this medication, and follow the directions you see here.   Pantoprazole Sodium Powd Give 1 packet (40 mg) via G-Tube one time a day for GERD   polyethylene glycol packet Commonly known as:  MIRALAX / GLYCOLAX Place 17 g into feeding tube daily.   traZODone 50 MG tablet Commonly known as:  DESYREL Take 1 tablet (50 mg total) by mouth at bedtime.   UNABLE TO FIND HSG Puree diet - HSG Puree texture, Regular consistency, may have pleasure finger foods with supervision    WATER FOR INJECTION STERILE IJ Place 30-60 mLs into feeding tube. Flush with 30-60 ml water before and after meds, before initiating feedings or when there is an interruption of feeding to maintain patency.  Also flush every shift with 5-10 ml water between each medication       No Known Allergies   Procedures/Studies:    Dg Chest 2 View  Result Date: 02/15/2017 CLINICAL DATA:  Sepsis EXAM: CHEST  2 VIEW COMPARISON:  03/16/2015 FINDINGS: Low lung volumes. No consolidation or effusion. Stable borderline cardiomegaly with aortic atherosclerosis. No pneumothorax. IMPRESSION: No active cardiopulmonary disease. Electronically Signed   By: Jasmine PangKim  Fujinaga M.D.   On: 02/15/2017 00:01   Ct Abdomen Pelvis W Contrast  Result Date: 02/22/2017 CLINICAL DATA:  Unspecified abdominal pain, leukocytosis, fever 103 degrees, history type II diabetes mellitus, hypertension, recurrent UTIs and urinary retention, prior DVT and PE EXAM: CT ABDOMEN AND PELVIS WITH CONTRAST TECHNIQUE: Multidetector CT imaging of the abdomen and pelvis was performed using the standard protocol following bolus administration of intravenous contrast. Sagittal and coronal MPR images reconstructed from axial data set. Scattered respiratory motion artifacts. CONTRAST:  100mL ISOVUE-300 IOPAMIDOL (ISOVUE-300) INJECTION 61% IV. No oral contrast. COMPARISON:  05/13/2015 FINDINGS: Lower chest: RIGHT lower lobe infiltrate Hepatobiliary: Contracted gallbladder.  Liver normal appearance Pancreas: Normal appearance Spleen: Small capsular calcification. No additional focal splenic abnormalities. Small splenule. Adrenals/Urinary Tract: Adrenal glands, kidneys, and ureters normal appearance. Diffuse bladder wall thickening with hazy perivesicular margins compatible with cystitis. Foley catheter within bladder lumen. Stomach/Bowel: Gastrostomy tube at mid stomach. Minimal sigmoid diverticulosis. Stomach and bowel loops otherwise normal appearance.  Vascular/Lymphatic: Atherosclerotic calcifications aorta and iliac arteries. Aorta normal caliber. No adenopathy. Reproductive: Uterus surgically absent.  Normal sized ovaries. Other: No free air or free fluid. No definite hernia. Foci of subcutaneous infiltration in the anterior abdominal wall question sites of medication injection. Musculoskeletal: Bones demineralized. IMPRESSION: Bladder wall thickening with hazy irregular perivesicular margins compatible with cystitis; recommend correlation with urinalysis. Minimal sigmoid diverticulosis. RIGHT lower lobe infiltrate question pneumonia or aspiration. No definite acute  intra-abdominal or intrapelvic abnormalities otherwise seen. Aortic Atherosclerosis (ICD10-I70.0). Electronically Signed   By: Ulyses Southward M.D.   On: 02/22/2017 15:45   Dg Chest Port 1 View  Result Date: 02/20/2017 CLINICAL DATA:  Follow-up pleural effusion. EXAM: PORTABLE CHEST 1 VIEW COMPARISON:  Chest x-rays of February 16, 2017 and January 12 172019. FINDINGS: The lungs are adequately inflated. There is no focal infiltrate. There is no pleural effusion. The heart and pulmonary vascularity are normal. The mediastinum is normal in width. There calcification in the wall of the aortic arch. The bony thorax is unremarkable. IMPRESSION: There is no active cardiopulmonary disease.  No pleural effusion. Thoracic aortic atherosclerosis. Electronically Signed   By: David  [place] M.D.   On: 02/20/2017 08:46   Dg Chest Port 1 View  Result Date: 02/16/2017 CLINICAL DATA:  52 year old female with shortness of breath. EXAM: PORTABLE CHEST 1 VIEW COMPARISON:  Chest radiograph dated 02/14/2017 FINDINGS: The patient is rotated. Slight increased density in the right upper lobe, likely related to atelectatic changes. There is no focal consolidation, pleural effusion, or pneumothorax. Evaluation of the lung apices is limited due to superimposition of the patient's mandible stable cardiac silhouette. No acute  osseous pathology. IMPRESSION: No focal consolidation. Probable atelectatic changes of the right upper lobe. Electronically Signed   By: Elgie Collard M.D.   On: 02/16/2017 03:17     The results of significant diagnostics from this hospitalization (including imaging, microbiology, ancillary and laboratory) are listed below for reference.     Microbiology: Recent Results (from the past 240 hour(s))  Culture, blood (Routine x 2)     Status: None   Collection Time: 02/14/17 11:19 PM  Result Value Ref Range Status   Specimen Description BLOOD RIGHT ANTECUBITAL  Final   Special Requests   Final    BOTTLES DRAWN AEROBIC AND ANAEROBIC Blood Culture adequate volume   Culture NO GROWTH 5 DAYS  Final   Report Status 02/20/2017 FINAL  Final  Culture, blood (Routine x 2)     Status: None   Collection Time: 02/15/17 12:14 AM  Result Value Ref Range Status   Specimen Description BLOOD LEFT ARM  Final   Special Requests   Final    BOTTLES DRAWN AEROBIC AND ANAEROBIC Blood Culture adequate volume   Culture NO GROWTH 5 DAYS  Final   Report Status 02/20/2017 FINAL  Final  Culture, blood (x 2)     Status: None   Collection Time: 02/15/17  2:38 PM  Result Value Ref Range Status   Specimen Description BLOOD LEFT ANTECUBITAL  Final   Special Requests   Final    BOTTLES DRAWN AEROBIC ONLY Blood Culture adequate volume   Culture NO GROWTH 5 DAYS  Final   Report Status 02/20/2017 FINAL  Final  MRSA PCR Screening     Status: None   Collection Time: 02/15/17  3:25 PM  Result Value Ref Range Status   MRSA by PCR NEGATIVE NEGATIVE Final    Comment:        The GeneXpert MRSA Assay (FDA approved for NASAL specimens only), is one component of a comprehensive MRSA colonization surveillance program. It is not intended to diagnose MRSA infection nor to guide or monitor treatment for MRSA infections.      Labs: BNP (last 3 results) No results for input(s): BNP in the last 8760 hours. Basic  Metabolic Panel: Recent Labs  Lab 02/18/17 0938 02/19/17 0757 02/20/17 0844 02/22/17 0520 02/23/17 0814  NA 138 139  138 138 137  K 4.0 3.8 4.0 4.0 4.6  CL 105 106 107 103 102  CO2 18* 20* 18* 22 23  GLUCOSE 98 139* 123* 102* 111*  BUN 12 11 19 16 12   CREATININE 0.71 0.57 0.67 0.58 0.60  CALCIUM 9.3 9.4 9.5 9.9 9.4   Liver Function Tests: Recent Labs  Lab 02/18/17 0938 02/19/17 0757 02/20/17 0844  AST 28 25 28   ALT 39 38 31  ALKPHOS 116 101 97  BILITOT 0.7 0.5 0.7  PROT 7.0 6.7 7.1  ALBUMIN 2.8* 2.6* 2.7*   No results for input(s): LIPASE, AMYLASE in the last 168 hours. No results for input(s): AMMONIA in the last 168 hours. CBC: Recent Labs  Lab 02/18/17 0938 02/19/17 0806 02/20/17 0844 02/22/17 0520 02/23/17 0814  WBC 13.0* 10.3 12.6* 13.3* 11.5*  NEUTROABS  --  7.6 8.5*  --   --   HGB 10.4* 10.6* 10.5* 10.5* 10.6*  HCT 33.8* 33.6* 33.0* 33.6* 33.3*  MCV 95.2 94.1 94.8 94.6 95.4  PLT 239 243 320 424* 433*   Cardiac Enzymes: No results for input(s): CKTOTAL, CKMB, CKMBINDEX, TROPONINI in the last 168 hours. BNP: Invalid input(s): POCBNP CBG: Recent Labs  Lab 02/23/17 1639 02/23/17 2000 02/23/17 2354 02/24/17 0408 02/24/17 0758  GLUCAP 110* 112* 106* 105* 115*   D-Dimer No results for input(s): DDIMER in the last 72 hours. Hgb A1c No results for input(s): HGBA1C in the last 72 hours. Lipid Profile No results for input(s): CHOL, HDL, LDLCALC, TRIG, CHOLHDL, LDLDIRECT in the last 72 hours. Thyroid function studies No results for input(s): TSH, T4TOTAL, T3FREE, THYROIDAB in the last 72 hours.  Invalid input(s): FREET3 Anemia work up No results for input(s): VITAMINB12, FOLATE, FERRITIN, TIBC, IRON, RETICCTPCT in the last 72 hours. Urinalysis    Component Value Date/Time   COLORURINE STRAW (A) 02/22/2017 1621   APPEARANCEUR CLEAR 02/22/2017 1621   LABSPEC >1.046 (H) 02/22/2017 1621   PHURINE 7.0 02/22/2017 1621   GLUCOSEU NEGATIVE 02/22/2017  1621   HGBUR SMALL (A) 02/22/2017 1621   BILIRUBINUR NEGATIVE 02/22/2017 1621   KETONESUR NEGATIVE 02/22/2017 1621   PROTEINUR NEGATIVE 02/22/2017 1621   NITRITE NEGATIVE 02/22/2017 1621   LEUKOCYTESUR NEGATIVE 02/22/2017 1621   Sepsis Labs Invalid input(s): PROCALCITONIN,  WBC,  LACTICIDVEN Microbiology Recent Results (from the past 240 hour(s))  Culture, blood (Routine x 2)     Status: None   Collection Time: 02/14/17 11:19 PM  Result Value Ref Range Status   Specimen Description BLOOD RIGHT ANTECUBITAL  Final   Special Requests   Final    BOTTLES DRAWN AEROBIC AND ANAEROBIC Blood Culture adequate volume   Culture NO GROWTH 5 DAYS  Final   Report Status 02/20/2017 FINAL  Final  Culture, blood (Routine x 2)     Status: None   Collection Time: 02/15/17 12:14 AM  Result Value Ref Range Status   Specimen Description BLOOD LEFT ARM  Final   Special Requests   Final    BOTTLES DRAWN AEROBIC AND ANAEROBIC Blood Culture adequate volume   Culture NO GROWTH 5 DAYS  Final   Report Status 02/20/2017 FINAL  Final  Culture, blood (x 2)     Status: None   Collection Time: 02/15/17  2:38 PM  Result Value Ref Range Status   Specimen Description BLOOD LEFT ANTECUBITAL  Final   Special Requests   Final    BOTTLES DRAWN AEROBIC ONLY Blood Culture adequate volume   Culture NO GROWTH 5  DAYS  Final   Report Status 02/20/2017 FINAL  Final  MRSA PCR Screening     Status: None   Collection Time: 02/15/17  3:25 PM  Result Value Ref Range Status   MRSA by PCR NEGATIVE NEGATIVE Final    Comment:        The GeneXpert MRSA Assay (FDA approved for NASAL specimens only), is one component of a comprehensive MRSA colonization surveillance program. It is not intended to diagnose MRSA infection nor to guide or monitor treatment for MRSA infections.      Time coordinating discharge: Over 30 minutes  SIGNED:   Calvert Cantor, MD  [facility] 02/24/2017, 8:53 AM Pager   If 7PM-7AM,  please contact night-coverage [email or web address] Password [email or web address]

## 2017-02-22 NOTE — Social Work (Signed)
CSW attempted to call pt son, no answer, CSW will attempt again tomorrow.   CSW continuing to follow.  Doy HutchingIsabel H Kavitha Lansdale, LCSWA [facility] Clinical Social Work [phone]

## 2017-02-23 LAB — CBC
HEMATOCRIT: 33.3 % — AB (ref 36.0–46.0)
Hemoglobin: 10.6 g/dL — ABNORMAL LOW (ref 12.0–15.0)
MCH: 30.4 pg (ref 26.0–34.0)
MCHC: 31.8 g/dL (ref 30.0–36.0)
MCV: 95.4 fL (ref 78.0–100.0)
Platelets: 433 10*3/uL — ABNORMAL HIGH (ref 150–400)
RBC: 3.49 MIL/uL — AB (ref 3.87–5.11)
RDW: 16 % — ABNORMAL HIGH (ref 11.5–15.5)
WBC: 11.5 10*3/uL — ABNORMAL HIGH (ref 4.0–10.5)

## 2017-02-23 LAB — GLUCOSE, CAPILLARY
GLUCOSE-CAPILLARY: 106 mg/dL — AB (ref 65–99)
GLUCOSE-CAPILLARY: 108 mg/dL — AB (ref 65–99)
GLUCOSE-CAPILLARY: 112 mg/dL — AB (ref 65–99)
Glucose-Capillary: 110 mg/dL — ABNORMAL HIGH (ref 65–99)
Glucose-Capillary: 122 mg/dL — ABNORMAL HIGH (ref 65–99)
Glucose-Capillary: 131 mg/dL — ABNORMAL HIGH (ref 65–99)

## 2017-02-23 LAB — BASIC METABOLIC PANEL
Anion gap: 12 (ref 5–15)
BUN: 12 mg/dL (ref 6–20)
CHLORIDE: 102 mmol/L (ref 101–111)
CO2: 23 mmol/L (ref 22–32)
Calcium: 9.4 mg/dL (ref 8.9–10.3)
Creatinine, Ser: 0.6 mg/dL (ref 0.44–1.00)
GFR calc non Af Amer: >60 mL/min (ref >60)
Glucose, Bld: 111 mg/dL — ABNORMAL HIGH (ref 65–99)
POTASSIUM: 4.6 mmol/L (ref 3.5–5.1)
Sodium: 137 mmol/L (ref 135–145)

## 2017-02-23 MED ORDER — DIPHENOXYLATE-ATROPINE 2.5-0.025 MG/5ML PO LIQD
5.0000 mL | Freq: Four times a day (QID) | ORAL | Status: DC
  Administered 2017-02-23 – 2017-02-24 (×4): 5 mL via ORAL
  Filled 2017-02-23 (×4): qty 5

## 2017-02-23 NOTE — Progress Notes (Signed)
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  She presented with fever of 103 and a WBC count of 30 and lactic acidosis. She was admitted for sepsis but source was uncertain at the time on admission. Also noted to have hypernatremia and AKI.   Subjective:  non verbal    Assessment & Plan:   Principal Problem:   Sepsis  - initial suspicion was aspiration as she had acute hypoxic respiratory failure as well - CXR on 1/20 and CXR done again today both are negative for infiltrates - blood cultures negative - no UTI -  stopped antibiotics on 1/23 after completing a 7 day course of Zosyn followed by Augmentin -  however WBC counts are rising since stopping antibiotics - CT scan of abd/pelvis shows a RLL pneumonia and findings consistent with a cystitis-   UA is negative - cont Rocephin- WBC count is improving  Active Problems: Diarrhea   - she began to have recurrent diarrhea a few days ago- she was receiving daily Miralax and this was discontinued which resulted in resolution of the diarrhea.  - she is not complaining of abdominal pain- abdomen is non-tender on exam -1/25- She is now having soft stools and not frequent stools - is incontinent of stool  - 1/26 - now having diarrhea again- will start Lomotil again  Tube feeds - tube feeds were at 20 cc/hr and were turned off by night cover provider after diarrhea occurred-  resumed at 50 cc/hr yesterday - dietician has changed the tube feed from vital to Jevity- follow -   Hypernatremia AKI - improved with hydration - cont free water per PEG -increased to QID  DM2 - diet control  HTN - cont  Amlodipine, Metoprolol, Clonidine  H/o CVA with PEG, right hemiparesis and aphasia - lives at a SNF  Seizure disorder - cont Keppra    h/o  DVT/ PE  - cont Lovenox    DVT prophylaxis: Lovenox Code Status: no CPR but she would like intubation/ ACLS meds, cardioversion and non-invasive ventilation Family Communication:  Disposition Plan: SNF when stable Consultants:    Procedures:    Antimicrobials:  Anti-infectives (From admission, onward)   Start     Dose/Rate Route Frequency Ordered Stop   02/22/17 1800  levofloxacin (LEVAQUIN) tablet 750 mg  Status:  Discontinued     750 mg Oral Daily 02/22/17 1642 02/22/17 1717   02/22/17 1730  cefTRIAXone (ROCEPHIN) 1 g in dextrose 5 % 50 mL IVPB     1 g 100 mL/hr over 30 Minutes Intravenous Every 24 hours 02/22/17 1718     02/18/17 1100  amoxicillin-clavulanate (AUGMENTIN) 875-125 MG per tablet 1 tablet     1 tablet Oral Every 12 hours 02/18/17 1044 02/20/17 2025   02/15/17 2200  vancomycin (VANCOCIN) IVPB 1000 mg/200 mL premix  Status:  Discontinued     1,000 mg 200 mL/hr over 60 Minutes Intravenous Every 24 hours 02/15/17 0051 02/17/17 0953   02/15/17 1930  piperacillin-tazobactam (ZOSYN) IVPB 3.375 g  Status:  Discontinued     3.375 g 12.5 mL/hr over 240 Minutes Intravenous Every 8 hours 02/15/17 1921 02/18/17 1044  02/15/17 0600  piperacillin-tazobactam (ZOSYN) IVPB 3.375 g  Status:  Discontinued     3.375 g 12.5 mL/hr over 240 Minutes Intravenous Every 8 hours 02/15/17 0051 02/15/17 1921   02/14/17 2330  piperacillin-tazobactam (ZOSYN) IVPB 3.375 g     3.375 g 100 mL/hr over 30 Minutes Intravenous  Once 02/14/17 2321 02/15/17 0054   02/14/17 2330  vancomycin (VANCOCIN) IVPB 1000 mg/200 mL premix     1,000 mg 200 mL/hr over 60 Minutes Intravenous  Once 02/14/17 2321 02/15/17 0303       Objective: Vitals:   02/22/17 1250 02/22/17 2100 02/23/17 0345 02/23/17 1315  BP: 109/88 114/85 121/76 117/80  Pulse: (!) 101 (!) 110  (!) 113 (!) 101  Resp: 18 18 18 15   Temp: 98 F (36.7 C) 98.8 F (37.1 C) 98.4 F (36.9 C) 99.5 F (37.5 C)  TempSrc: Oral Oral Oral Oral  SpO2: 99% 97% 100% 99%  Weight:   60.2 kg (132 lb 11.5 oz)   Height:        Intake/Output Summary (Last 24 hours) at 02/23/2017 1613 Last data filed at 02/23/2017 1318 Gross per 24 hour  Intake 1876.67 ml  Output 1100 ml  Net 776.67 ml   Filed Weights   02/21/17 0429 02/22/17 0416 02/23/17 0345  Weight: 60.6 kg (133 lb 9.6 oz) 61.4 kg (135 lb 5.8 oz) 60.2 kg (132 lb 11.5 oz)    Examination: General exam: Appears comfortable  HEENT: PERRLA, oral mucosa moist, no sclera icterus or thrush Respiratory system: Clear to auscultation. Respiratory effort normal. Cardiovascular system: S1 & S2 heard, RRR.  No murmurs  Gastrointestinal system: Abdomen soft, non-tender, nondistended. Normal bowel sound. No organomegaly- PEG intact- watery stool  Central nervous system: Alert and oriented. No focal neurological deficits. Extremities: No cyanosis, clubbing or edema Skin: No rashes or ulcers   Data Reviewed: I have personally reviewed following labs and imaging studies  CBC: Recent Labs  Lab 02/17/17 0231 02/18/17 [id number] 02/19/17 0806 02/20/17 0844 02/22/17 0520 02/23/17 0814  WBC 18.4* 13.0* 10.3 12.6* 13.3* 11.5*  NEUTROABS 12.4*  --  7.6 8.5*  --   --   HGB 9.4* 10.4* 10.6* 10.5* 10.5* 10.6*  HCT 31.6* 33.8* 33.6* 33.0* 33.6* 33.3*  MCV 97.8 95.2 94.1 94.8 94.6 95.4  PLT 214 239 243 320 424* 433*   Basic Metabolic Panel: Recent Labs  Lab 02/17/17 0319 02/18/17 0938 02/19/17 0757 02/20/17 0844 02/22/17 0520 02/23/17 0814  NA  --  138 139 138 138 137  K  --  4.0 3.8 4.0 4.0 4.6  CL  --  105 106 107 103 102  CO2  --  18* 20* 18* 22 23  GLUCOSE  --  98 139* 123* 102* 111*  BUN  --  12 11 19 16 12   CREATININE  --  0.71 0.57 0.67 0.58 0.60  CALCIUM  --  9.3 9.4 9.5 9.9 9.4  MG 2.3  --   --   --   --   --   PHOS 3.0  --   --   --    --   --    GFR: Estimated Creatinine Clearance: 70.2 mL/min (by C-G formula based on SCr of 0.6 mg/dL). Liver Function Tests: Recent Labs  Lab 02/17/17 0231 02/18/17 0938 02/19/17 0757 02/20/17 0844  AST 32 28 25 28   ALT 51 39 38 31  ALKPHOS 122 116 101 97  BILITOT 0.5 0.7 0.5 0.7  PROT 6.5 7.0  6.7 7.1  ALBUMIN 2.4* 2.8* 2.6* 2.7*   No results for input(s): LIPASE, AMYLASE in the last 168 hours. No results for input(s): AMMONIA in the last 168 hours. Coagulation Profile: No results for input(s): INR, PROTIME in the last 168 hours. Cardiac Enzymes: No results for input(s): CKTOTAL, CKMB, CKMBINDEX, TROPONINI in the last 168 hours. BNP (last 3 results) No results for input(s): PROBNP in the last 8760 hours. HbA1C: No results for input(s): HGBA1C in the last 72 hours. CBG: Recent Labs  Lab 02/22/17 0758 02/22/17 1215 02/23/17 0008 02/23/17 0752 02/23/17 1154  GLUCAP 127* 118* 131* 108* 122*   Lipid Profile: No results for input(s): CHOL, HDL, LDLCALC, TRIG, CHOLHDL, LDLDIRECT in the last 72 hours. Thyroid Function Tests: No results for input(s): TSH, T4TOTAL, FREET4, T3FREE, THYROIDAB in the last 72 hours. Anemia Panel: No results for input(s): VITAMINB12, FOLATE, FERRITIN, TIBC, IRON, RETICCTPCT in the last 72 hours. Urine analysis:    Component Value Date/Time   COLORURINE STRAW (A) 02/22/2017 1621   APPEARANCEUR CLEAR 02/22/2017 1621   LABSPEC >1.046 (H) 02/22/2017 1621   PHURINE 7.0 02/22/2017 1621   GLUCOSEU NEGATIVE 02/22/2017 1621   HGBUR SMALL (A) 02/22/2017 1621   BILIRUBINUR NEGATIVE 02/22/2017 1621   KETONESUR NEGATIVE 02/22/2017 1621   PROTEINUR NEGATIVE 02/22/2017 1621   NITRITE NEGATIVE 02/22/2017 1621   LEUKOCYTESUR NEGATIVE 02/22/2017 1621   Sepsis Labs: @LABRCNTIP (procalcitonin:4,lacticidven:4) ) Recent Results (from the past 240 hour(s))  Culture, blood (Routine x 2)     Status: None   Collection Time: 02/14/17 11:19 PM  Result Value Ref  Range Status   Specimen Description BLOOD RIGHT ANTECUBITAL  Final   Special Requests   Final    BOTTLES DRAWN AEROBIC AND ANAEROBIC Blood Culture adequate volume   Culture NO GROWTH 5 DAYS  Final   Report Status 02/20/2017 FINAL  Final  Culture, blood (Routine x 2)     Status: None   Collection Time: 02/15/17 12:14 AM  Result Value Ref Range Status   Specimen Description BLOOD LEFT ARM  Final   Special Requests   Final    BOTTLES DRAWN AEROBIC AND ANAEROBIC Blood Culture adequate volume   Culture NO GROWTH 5 DAYS  Final   Report Status 02/20/2017 FINAL  Final  Culture, blood (x 2)     Status: None   Collection Time: 02/15/17  2:38 PM  Result Value Ref Range Status   Specimen Description BLOOD LEFT ANTECUBITAL  Final   Special Requests   Final    BOTTLES DRAWN AEROBIC ONLY Blood Culture adequate volume   Culture NO GROWTH 5 DAYS  Final   Report Status 02/20/2017 FINAL  Final  MRSA PCR Screening     Status: None   Collection Time: 02/15/17  3:25 PM  Result Value Ref Range Status   MRSA by PCR NEGATIVE NEGATIVE Final    Comment:        The GeneXpert MRSA Assay (FDA approved for NASAL specimens only), is one component of a comprehensive MRSA colonization surveillance program. It is not intended to diagnose MRSA infection nor to guide or monitor treatment for MRSA infections.          Radiology Studies: Ct Abdomen Pelvis W Contrast  Result Date: 02/22/2017 CLINICAL DATA:  Unspecified abdominal pain, leukocytosis, fever 103 degrees, history type II diabetes mellitus, hypertension, recurrent UTIs and urinary retention, prior DVT and PE EXAM: CT ABDOMEN AND PELVIS WITH CONTRAST TECHNIQUE: Multidetector CT imaging of the abdomen and pelvis  was performed using the standard protocol following bolus administration of intravenous contrast. Sagittal and coronal MPR images reconstructed from axial data set. Scattered respiratory motion artifacts. CONTRAST:  ISOVUE-300 IOPAMIDOL  (ISOVUE-300) INJECTION 61% IV. No oral contrast. COMPARISON:  05/13/2015 FINDINGS: Lower chest: RIGHT lower lobe infiltrate Hepatobiliary: Contracted gallbladder.  Liver normal appearance Pancreas: Normal appearance Spleen: Small capsular calcification. No additional focal splenic abnormalities. Small splenule. Adrenals/Urinary Tract: Adrenal glands, kidneys, and ureters normal appearance. Diffuse bladder wall thickening with hazy perivesicular margins compatible with cystitis. Foley catheter within bladder lumen. Stomach/Bowel: Gastrostomy tube at mid stomach. Minimal sigmoid diverticulosis. Stomach and bowel loops otherwise normal appearance. Vascular/Lymphatic: Atherosclerotic calcifications aorta and iliac arteries. Aorta normal caliber. No adenopathy. Reproductive: Uterus surgically absent.  Normal sized ovaries. Other: No free air or free fluid. No definite hernia. Foci of subcutaneous infiltration in the anterior abdominal wall question sites of medication injection. Musculoskeletal: Bones demineralized. IMPRESSION: Bladder wall thickening with hazy irregular perivesicular margins compatible with cystitis; recommend correlation with urinalysis. Minimal sigmoid diverticulosis. RIGHT lower lobe infiltrate question pneumonia or aspiration. No definite acute intra-abdominal or intrapelvic abnormalities otherwise seen. Aortic Atherosclerosis (ICD10-I70.0). Electronically Signed   By: Ulyses Southward M.D.   On: 02/22/2017 15:45      Scheduled Meds: . bethanechol  10 mg Per Tube Q8H  . chlorhexidine  15 mL Mouth Rinse BID  . enoxaparin (LOVENOX) injection  60 mg Subcutaneous BID  . feeding supplement (PRO-STAT SUGAR FREE 64)  30 mL Per Tube TID WC  . free water  200 mL Per Tube Q6H  . gabapentin  300 mg Per Tube BID  . insulin aspart  0-9 Units Subcutaneous Q4H  . levETIRAcetam  500 mg Per Tube BID  . liver oil-zinc oxide   Topical QID  . mouth rinse  15 mL Mouth Rinse q12n4p  . multivitamin  15 mL Per  Tube Daily  . potassium chloride  40 mEq Per Tube Daily  . tiZANidine  6 mg Per Tube TID   Continuous Infusions: . sodium chloride 100 mL/hr at 02/23/17 1428  . cefTRIAXone (ROCEPHIN)  IV 1 g (02/22/17 2134)  . feeding supplement (JEVITY 1.5 CAL/FIBER) 1,000 mL (02/20/17 1229)     LOS: 8 days    Time spent in minutes: 35    Calvert Cantor, MD [facility] Pager: [email or web address] Password [facility] 02/23/2017, 4:13 PM

## 2017-02-24 DIAGNOSIS — D72825 Bandemia: Secondary | ICD-10-CM

## 2017-02-24 DIAGNOSIS — K591 Functional diarrhea: Secondary | ICD-10-CM

## 2017-02-24 DIAGNOSIS — J181 Lobar pneumonia, unspecified organism: Secondary | ICD-10-CM

## 2017-02-24 DIAGNOSIS — E860 Dehydration: Secondary | ICD-10-CM

## 2017-02-24 DIAGNOSIS — J9621 Acute and chronic respiratory failure with hypoxia: Secondary | ICD-10-CM

## 2017-02-24 LAB — BASIC METABOLIC PANEL
ANION GAP: 16 — AB (ref 5–15)
BUN: 11 mg/dL (ref 6–20)
CHLORIDE: 105 mmol/L (ref 101–111)
CO2: 17 mmol/L — ABNORMAL LOW (ref 22–32)
Calcium: 9.4 mg/dL (ref 8.9–10.3)
Creatinine, Ser: 0.57 mg/dL (ref 0.44–1.00)
GFR calc Af Amer: >60 mL/min (ref >60)
GFR calc non Af Amer: >60 mL/min (ref >60)
Glucose, Bld: 115 mg/dL — ABNORMAL HIGH (ref 65–99)
POTASSIUM: 4.2 mmol/L (ref 3.5–5.1)
SODIUM: 138 mmol/L (ref 135–145)

## 2017-02-24 LAB — CBC
HEMATOCRIT: 35.3 % — AB (ref 36.0–46.0)
HEMOGLOBIN: 11.1 g/dL — AB (ref 12.0–15.0)
MCH: 30.1 pg (ref 26.0–34.0)
MCHC: 31.4 g/dL (ref 30.0–36.0)
MCV: 95.7 fL (ref 78.0–100.0)
Platelets: 387 10*3/uL (ref 150–400)
RBC: 3.69 MIL/uL — AB (ref 3.87–5.11)
RDW: 15.6 % — AB (ref 11.5–15.5)
WBC: 11.2 10*3/uL — AB (ref 4.0–10.5)

## 2017-02-24 LAB — GLUCOSE, CAPILLARY
GLUCOSE-CAPILLARY: 103 mg/dL — AB (ref 65–99)
Glucose-Capillary: 105 mg/dL — ABNORMAL HIGH (ref 65–99)
Glucose-Capillary: 115 mg/dL — ABNORMAL HIGH (ref 65–99)

## 2017-02-24 MED ORDER — LOPERAMIDE HCL 2 MG PO TABS: 2.0000 mg | ORAL_TABLET | Freq: Four times a day (QID) | ORAL | 0 refills | Status: DC | PRN

## 2017-02-24 MED ORDER — OXYCODONE HCL 5 MG PO TABS: 5.0000 mg | ORAL_TABLET | ORAL | 0 refills | Status: DC | PRN

## 2017-02-24 MED ORDER — TRAZODONE HCL 50 MG PO TABS: 50.0000 mg | ORAL_TABLET | Freq: Every day | ORAL | Status: DC

## 2017-02-24 MED ORDER — CEFUROXIME AXETIL 250 MG/5ML PO SUSR: 500.0000 mg | Freq: Two times a day (BID) | ORAL | 0 refills | Status: AC

## 2017-02-24 NOTE — Clinical Social Work Note (Signed)
Clinical Social Work Assessment  Patient Details  Name: Rachel Vang MRN: [id number] Date of Birth: 02/13/1965  Date of referral:  02/24/17               Reason for consult:  Facility Placement                Permission sought to share information with:    Permission granted to share information::  Yes, Verbal Permission Granted  Name::     Okey DupreCarlton Lathon  Relationship::  Son  Contact Information:  [phone]  Housing/Transportation Living arrangements for the past 2 months:  Skilled Nursing Facility Source of Information:  Adult Children Patient Interpreter Needed:  None Criminal Activity/Legal Involvement Pertinent to Current Situation/Hospitalization:  No - Comment as needed Significant Relationships:  Adult Children Lives with:  Facility Resident Do you feel safe going back to the place where you live?  Yes Need for family participation in patient care:  Yes (Comment)  Care giving concerns:  Patient son verbalized no concerns regarding patient return to [facility].   Social Worker assessment / plan:  Visual merchandiserClinical Social Worker spoke with patient son over the phone to offer support and discuss patient needs at discharge.  Patient son states that patient is a current [facility] at [facility] and plans for her to return at discharge.  CSW notified facility who will review discharge summary and notify with time of discharge.  CSW to follow up with RN to arrange transportation.  Employment status:  Disabled (Comment on whether or not currently receiving Disability) Insurance information:  [place] Recommend[place]:  Skilled Nursing Facility Information / Referral to community resources:  Skilled Nursing Facility  Patient/Family's Response to care:  Patient son verbalized understanding of CSW role and appreciation for support.  Patient son is agreeable with return to [facility] if medically [facility].  Patient/Family's Understanding of and Emotional Response to Diagnosis,  Current Treatment, and Prognosis:  Patient family understanding of patient continued need for SNF placement.  Emotional Assessment Appearance:  Appears older than stated age Attitude/Demeanor/Rapport:  Unable to Assess Affect (typically observed):  Unable to Assess Orientation:  Oriented to Self, Oriented to Place Alcohol / Substance use:  Not Applicable Psych involvement (Current and /or in the community):  No (Comment)  Discharge Needs  Concerns to be addressed:  No discharge needs identified Readmission within the last 30 days:  No Current discharge risk:  None Barriers to Discharge:  Continued Medical Work up, No Barriers Identified  Macario GoldsJesse Stella Encarnacion, LCSW [phone]

## 2017-02-24 NOTE — Progress Notes (Signed)
Pt discharged to SNF. Report called to SNF - [facility] arrived at 1700 to pick up patient for transport. Pt PIV removed , cleaned and dressed from transport - with charge nurse assistance. VS taken and given to EMS

## 2017-02-24 NOTE — Clinical Social Work Note (Signed)
Clinical Social Worker facilitated patient discharge including contacting patient family and facility to confirm patient discharge plans.  Clinical information faxed to facility and family agreeable with plan.  CSW arranged ambulance transport via [facility] to [facility].  RN to call report prior to discharge.  Clinical Social Worker will sign off for now as social work intervention is no longer needed. Please consult us again if new need arises.  Jesse Demetria Lightsey, LCSW [phone] 

## 2017-02-24 NOTE — Progress Notes (Signed)
Called CSW to facilitate discharge to facility.

## 2017-02-25 ENCOUNTER — Encounter: Payer: Self-pay | Admitting: Internal Medicine

## 2017-02-25 ENCOUNTER — Non-Acute Institutional Stay (SKILLED_NURSING_FACILITY): Payer: Medicaid Other | Admitting: Internal Medicine

## 2017-02-25 ENCOUNTER — Telehealth: Payer: Self-pay

## 2017-02-25 DIAGNOSIS — E1149 Type 2 diabetes mellitus with other diabetic neurological complication: Secondary | ICD-10-CM | POA: Diagnosis not present

## 2017-02-25 DIAGNOSIS — J181 Lobar pneumonia, unspecified organism: Secondary | ICD-10-CM | POA: Diagnosis not present

## 2017-02-25 DIAGNOSIS — R195 Other fecal abnormalities: Secondary | ICD-10-CM

## 2017-02-25 DIAGNOSIS — Z931 Gastrostomy status: Secondary | ICD-10-CM | POA: Diagnosis not present

## 2017-02-25 DIAGNOSIS — I69351 Hemiplegia and hemiparesis following cerebral infarction affecting right dominant side: Secondary | ICD-10-CM

## 2017-02-25 DIAGNOSIS — Z8673 Personal history of transient ischemic attack (TIA), and cerebral infarction without residual deficits: Secondary | ICD-10-CM | POA: Diagnosis not present

## 2017-02-25 DIAGNOSIS — I825Y2 Chronic embolism and thrombosis of unspecified deep veins of left proximal lower extremity: Secondary | ICD-10-CM

## 2017-02-25 LAB — GLUCOSE, CAPILLARY
GLUCOSE-CAPILLARY: 98 mg/dL (ref 65–99)
Glucose-Capillary: 113 mg/dL — ABNORMAL HIGH (ref 65–99)
Glucose-Capillary: 129 mg/dL — ABNORMAL HIGH (ref 65–99)
Glucose-Capillary: 96 mg/dL (ref 65–99)

## 2017-02-25 NOTE — Telephone Encounter (Signed)
This is a patient of [facility], who was [facility].

## 2017-02-25 NOTE — Progress Notes (Signed)
Patient ID: Rachel Vang, female   DOB: 1965/06/22, 52 y.o.   MRN: [id number]  Provider:  DR Elmon Kirschner Location:  [facility]   Place of Service:  SNF (31)  PCP: Patient, No Pcp Per Patient Care Team: Patient, No Pcp Per as PCP - General (General Practice) Sharee Holster, NP as Nurse Practitioner (Geriatric Medicine) Center, [facility] ([facility] Facility)  Extended Emergency Contact Information Primary Emergency Contact: Silas [place] Mobile Phone: [phone] Relation: Son Secondary Emergency Contact: Debroah Baller [place] Mobile Phone: [phone] Relation: Son  Code Status:  Goals of Care: Advanced Directive information Advanced Directives 02/14/2017  Does Patient Have a Medical Advance Directive? Yes  Type of Advance Directive Out of facility DNR (pink MOST or yellow form)  Does patient want to make changes to medical advance directive? No - Patient declined  Copy of Healthcare Power of Attorney in Chart? -  Would patient like information on creating a medical advance directive? -  Pre-existing out of facility DNR order (yellow form or pink MOST form) Pink MOST form placed in chart (order not valid for inpatient use)      Chief Complaint  Patient presents with  . Readmit To SNF    sepsis, fever, RLL pneumonia, diarrhea    HPI: Patient is a 52 y.o. female seen today for re-admission to SNF following hospital stay for RLL pneumonia, sepsis/fever, diarrhea, sz d/o, DVT hx, DM, right hemiparesis with hx CVA, pressure injury of skin, hx dysphagia s/p Peg. CXR neg but CT chest (+) RLL infiltrate and given IV rocephin. Antimotility agents given for diarrhea. TF changed. Albumin 2.7; WBC 11.5K; Hgb 10.6 at d/c. She presents to SNF to continue long term care.  Today she reports abdominal pain and loose stools. No f/c. No falls. She gets TF and is tolerating them. She is a poor historian due to  expressive aphasia. Hx obtained from chart.  HTN - BP stable on lopressor 125 mg twice daily; clonidine 0.1 mg twice daily; norvasc 5 mg daily   Dysphagia paraesophageal phase - she is s/p Peg and dependent on TF for nutrition. No signs of aspiration at this time  Hx CVA/SAH with late effect - she has right hemiparesis; neurologically stable on chronic lovenox therapy.   urine retention with hx recurrent UTI - stable without meds   DM - diet controlled. A1c 5.4% (previous 5.7). LDL  85   Seizure d/o - stable on keppra 500 mg twice daily   Hx DVT/PE - stable.  she does require long term anticoagulation therapy; she gets lovenox 50 mg twice daily (weight based). she is not a candidate for xarelto or eliquis due to her history of GI bleed; she is not appropriate for coumadin therapy as she is unable to obtain an adequate INR.  Past Medical History:  Diagnosis Date  . Acute pulmonary embolism (HCC) 02/19/2015  . Acute respiratory failure (HCC)   . Diabetes mellitus without complication (HCC)    Type 2, W/o complications  . DVT (deep venous thrombosis) (HCC) 04/05/2015  . Dysphagia   . Epilepsy (HCC)   . GERD (gastroesophageal reflux disease)   . Hyperlipidemia   . Hypertension   . IBS (irritable bowel syndrome)   . Nontraumatic subarachnoid hemorrhage (HCC)   . SAH (subarachnoid hemorrhage) (HCC)   . Urinary retention    Past Surgical History:  Procedure Laterality Date  . ABDOMINAL SURGERY    . ANEURYSM  COILING    . COLONOSCOPY N/A 02/20/2015   Procedure: COLONOSCOPY;  Surgeon: Iva Boop, MD;  Location: [facility] ENDOSCOPY;  Service: Endoscopy;  Laterality: N/A;  . ESOPHAGOGASTRODUODENOSCOPY (EGD) WITH PROPOFOL N/A 02/02/2015   Procedure: ESOPHAGOGASTRODUODENOSCOPY (EGD) WITH PROPOFOL;  Surgeon: Jimmye Norman, MD;  Location: [facility] ENDOSCOPY;  Service: General;  Laterality: N/A;  . IR GENERIC HISTORICAL  08/26/2015   IR GASTRIC TUBE PERC CHG W/O IMG GUIDE 08/26/2015 Irish Lack, MD  [facility]-INTERV RAD  . IR GENERIC HISTORICAL  09/14/2015   IR REPLC GASTRO/COLONIC TUBE PERCUT W/FLUORO 09/14/2015 Darrell K Allred, PA-C [facility]-INTERV RAD  . IR REPLACE G-TUBE SIMPLE WO FLUORO  06/08/2016  . IR REPLACE G-TUBE SIMPLE WO FLUORO  10/24/2016  . IR REPLACE G-TUBE SIMPLE WO FLUORO  01/07/2017  . PEG PLACEMENT N/A 02/02/2015   Procedure: PERCUTANEOUS ENDOSCOPIC GASTROSTOMY (PEG) PLACEMENT;  Surgeon: Jimmye Norman, MD;  Location: [facility] ENDOSCOPY;  Service: General;  Laterality: N/A;  . RADIOLOGY WITH ANESTHESIA N/A 01/13/2015   Procedure: RADIOLOGY WITH ANESTHESIA;  Surgeon: Lisbeth Renshaw, MD;  Location: [facility] OR;  Service: Radiology;  Laterality: N/A;  . TRACHEOSTOMY      reports that she has quit smoking. she has never used smokeless tobacco. She reports that she does not drink alcohol or use drugs. Social History   Socioeconomic History  . Marital status: Single    Spouse name: Not on file  . Number of children: Not on file  . Years of education: Not on file  . Highest education level: Not on file  Social Needs  . Financial resource strain: Not on file  . Food insecurity - worry: Not on file  . Food insecurity - inability: Not on file  . Transportation needs - medical: Not on file  . Transportation needs - non-medical: Not on file  Occupational History  . Not on file  Tobacco Use  . Smoking status: Former Games developer  . Smokeless tobacco: Never Used  Substance and Sexual Activity  . Alcohol use: No  . Drug use: No  . Sexual activity: Not on file  Other Topics Concern  . Not on file  Social History Narrative  . Not on file    Functional Status Survey:    Family History  Problem Relation Age of Onset  . Hypertension Other     Health Maintenance  Topic Date Due  . PAP SMEAR  03/27/2017 (Originally 05/04/1984)  . TETANUS/TDAP  03/27/2017 (Originally 05/05/1982)  . Hepatitis C Screening  03/27/2017 (Originally 08-11-1965)  . PNEUMOCOCCAL POLYSACCHARIDE VACCINE (1) 04/25/2017  (Originally 05/04/1965)  . OPHTHALMOLOGY EXAM  10/17/2017 (Originally 10/05/2016)  . FOOT EXAM  12/27/2017 (Originally 11/03/2016)  . HEMOGLOBIN A1C  08/15/2017  . URINE MICROALBUMIN  09/24/2017  . MAMMOGRAM  06/15/2018  . COLONOSCOPY  02/19/2025  . INFLUENZA VACCINE  Completed  . HIV Screening  Completed    No Known Allergies  Outpatient Encounter Medications as of 02/25/2017  Medication Sig  . acetaminophen (TYLENOL) 325 MG tablet Place 650 mg into feeding tube daily. And every 4 hours as needed for mild pain,fever  . Amino Acids-Protein Hydrolys (FEEDING SUPPLEMENT, PRO-STAT SUGAR FREE 64,) LIQD Place 60 mLs into feeding tube 3 (three) times daily with meals.  . bethanechol (URECHOLINE) 10 MG tablet 10 mg by PEG Tube route every 8 (eight) hours.   . cefUROXime (CEFTIN) 250 MG/5ML suspension Take 10 mLs (500 mg total) by mouth 2 (two) times daily for 10 days.  Marland Kitchen enoxaparin (LOVENOX) 60 MG/0.6ML injection  Inject 60 mg into the skin 2 (two) times daily.   Marland Kitchen gabapentin (NEURONTIN) 100 MG capsule Take 300 mg by mouth 2 (two) times daily. Hold if Lathargic  . levETIRAcetam (KEPPRA) 100 MG/ML solution Place 5 mLs (500 mg total) into feeding tube 2 (two) times daily.  Marland Kitchen liver oil-zinc oxide (DESITIN) 40 % ointment Apply topically 4 (four) times daily.  Marland Kitchen loperamide (IMODIUM A-D) 2 MG tablet Take 1 tablet (2 mg total) by mouth 4 (four) times daily as needed for diarrhea or loose stools.  . Multiple Vitamins-Minerals (DECUBI-VITE) CAPS Place 1 capsule into feeding tube daily.   . Nutritional Supplements (FEEDING SUPPLEMENT, JEVITY 1.5 CAL/FIBER,) LIQD Place 1,000 mLs into feeding tube continuous.  . ondansetron (ZOFRAN) 4 MG tablet Take 4 mg by mouth daily.  Marland Kitchen oxyCODONE (ROXICODONE) 5 MG immediate release tablet Place 1 tablet (5 mg total) into feeding tube every 4 (four) hours as needed for severe pain.  . Pantoprazole Sodium POWD Give 1 packet (40 mg) via G-Tube one time a day for GERD  .  polyethylene glycol (MIRALAX / GLYCOLAX) packet Place 17 g into feeding tube daily.  . traZODone (DESYREL) 50 MG tablet Take 1 tablet (50 mg total) by mouth at bedtime.  Marland Kitchen UNABLE TO FIND HSG Puree diet - HSG Puree texture, Regular consistency, may have pleasure finger foods with supervision  . WATER FOR INJECTION STERILE IJ Place 30-60 mLs into feeding tube. Flush with 30-60 ml water before and after meds, before initiating feedings or when there is an interruption of feeding to maintain patency.  Also flush every shift with 5-10 ml water between each medication  . Water For Irrigation, Sterile (FREE WATER) SOLN Place 200 mLs into feeding tube 4 (four) times daily.   No facility-administered encounter medications on file as of 02/25/2017.     Review of Systems  Unable to perform ROS: Other (expressive aphasia)    Vitals:   02/25/17 2301  BP: 128/74  Pulse: (!) 113  Temp: 98.3 F (36.8 C)  SpO2: 97%  Weight: 128 lb 4.8 oz (58.2 kg)   Body mass index is 22.02 kg/m. Physical Exam  Constitutional: She appears well-developed.  Lying in bed in NAD, frail appearing  HENT:  Mouth/Throat: Oropharynx is clear and moist. No oropharyngeal exudate.  MMM; no oral thrush  Eyes: Pupils are equal, round, and reactive to light. No scleral icterus.  Neck: Neck supple. Carotid bruit is not present. No tracheal deviation present. No thyromegaly present.  Cardiovascular: Regular rhythm and intact distal pulses. Tachycardia present. Exam reveals no gallop and no friction rub.  Murmur heard.  Systolic murmur is present with a grade of 1/6. Trace BLE edema with no calf TTP b/l  Pulmonary/Chest: Effort normal and breath sounds normal. No stridor. No respiratory distress. She has no wheezes. She has no rales.  Abdominal: Soft. Normal appearance and bowel sounds are normal. She exhibits no distension and no mass. There is no hepatomegaly. There is tenderness. There is no rigidity, no rebound and no guarding.  No hernia.  (+) Peg intact with no redness or d/c at insertion site; TF running  Musculoskeletal: She exhibits edema and deformity (LUE contracture).  Lymphadenopathy:    She has no cervical adenopathy.  Neurological: She is alert.  Right hemiparesis  Skin: Skin is warm and dry. No rash noted.  Psychiatric: She has a normal mood and affect. Her behavior is normal.    Labs reviewed: Basic Metabolic Panel: Recent Labs  02/16/17 [id number]  02/17/17 0319  02/22/17 0520 02/23/17 0814 02/24/17 0818  NA 151*   < >  --    < > 138 137 138  K 4.1   < >  --    < > 4.0 4.6 4.2  CL 111   < >  --    < > 103 102 105  CO2 21*   < >  --    < > 22 23 17*  GLUCOSE 124*   < >  --    < > 102* 111* 115*  BUN 15   < >  --    < > 16 12 11   CREATININE 0.88   < >  --    < > 0.58 0.60 0.57  CALCIUM 9.5   < >  --    < > 9.9 9.4 9.4  MG  --   --  2.3  --   --   --   --   PHOS 2.3*  --  3.0  --   --   --   --    < > = values in this interval not displayed.   Liver Function Tests: Recent Labs    02/18/17 0938 02/19/17 0757 02/20/17 0844  AST 28 25 28   ALT 39 38 31  ALKPHOS 116 101 97  BILITOT 0.7 0.5 0.7  PROT 7.0 6.7 7.1  ALBUMIN 2.8* 2.6* 2.7*   No results for input(s): LIPASE, AMYLASE in the last 8760 hours. No results for input(s): AMMONIA in the last 8760 hours. CBC: Recent Labs    02/17/17 0231  02/19/17 0806 02/20/17 0844 02/22/17 0520 02/23/17 0814 02/24/17 0818  WBC 18.4*   < > 10.3 12.6* 13.3* 11.5* 11.2*  NEUTROABS 12.4*  --  7.6 8.5*  --   --   --   HGB 9.4*   < > 10.6* 10.5* 10.5* 10.6* 11.1*  HCT 31.6*   < > 33.6* 33.0* 33.6* 33.3* 35.3*  MCV 97.8   < > 94.1 94.8 94.6 95.4 95.7  PLT 214   < > 243 320 424* 433* 387   < > = values in this interval not displayed.   Cardiac Enzymes: No results for input(s): CKTOTAL, CKMB, CKMBINDEX, TROPONINI in the last 8760 hours. BNP: Invalid input(s): POCBNP Lab Results  Component Value Date   HGBA1C 6.1 (H) 02/15/2017   No results  found for: TSH Lab Results  Component Value Date   VITAMINB12 521 06/14/2016   No results found for: FOLATE No results found for: IRON, TIBC, FERRITIN  Imaging and Procedures obtained prior to SNF admission: Dg Chest 2 View  Result Date: 02/15/2017 CLINICAL DATA:  Sepsis EXAM: CHEST  2 VIEW COMPARISON:  03/16/2015 FINDINGS: Low lung volumes. No consolidation or effusion. Stable borderline cardiomegaly with aortic atherosclerosis. No pneumothorax. IMPRESSION: No active cardiopulmonary disease. Electronically Signed   By: Jasmine PangKim  Fujinaga M.D.   On: 02/15/2017 00:01    Assessment/Plan   ICD-10-CM   1. Loose stools R19.5    probably 2/2 abx +/- dumping syndrome; do not suspect C diff at this time  2. Lobar pneumonia (HCC) J18.1    RLL  3. S/P percutaneous endoscopic gastrostomy (PEG) tube placement (HCC) Z93.1   4. Hemiparesis affecting right side as late effect of stroke (HCC) I69.351   5. History of stroke Z86.73   6. 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 Chronic deep vein thrombosis (DVT) of proximal vein  of left lower extremity (HCC) I82.5Y2     Finish abx  cont current meds as ordered. Cont lovenox for anticoagulation   Aspiration precautions  Cont TF as ordered  Peg tube care as indicated  PT/OT/ST as indicated  GOAL: short term rehab then long term care. Communicated with pt and nursing.  Will follow  Christos Mixson S. Ancil Linsey  [facility] [address] [place], [place] [address] [phone] Cell (Monday-Friday 8 AM - 5 PM) [phone] After 5 PM and follow prompts

## 2017-03-13 ENCOUNTER — Encounter: Payer: Self-pay | Admitting: Adult Health

## 2017-03-13 ENCOUNTER — Non-Acute Institutional Stay (SKILLED_NURSING_FACILITY): Payer: Medicaid Other | Admitting: Adult Health

## 2017-03-13 DIAGNOSIS — K117 Disturbances of salivary secretion: Secondary | ICD-10-CM | POA: Diagnosis not present

## 2017-03-13 DIAGNOSIS — F458 Other somatoform disorders: Secondary | ICD-10-CM

## 2017-03-13 NOTE — Progress Notes (Signed)
Location:    [place] Room Number: 026 VZCHY of Service:  SNF (31)   CODE STATUS: full code (MOST reviewed 08-21-16)  No Known Allergies  Chief Complaint  Patient presents with  . Acute Visit    excessive oral secretions     HPI:  Speech therapy reports that she is having excessive oral secretions. She is presently NPO as she is not participating in pleasure feeds at this time. Her secretions are such that she is requiring a cup to spit in. She has several ounces of saliva present in the cup. She is unable to participate in the hpi or ros. There are no reports of her declining her tube feeds; there are no reports of fevers; no signs of aspirations present no changes in behaviors.    Past Medical History:  Diagnosis Date  . Acute pulmonary embolism ([place]) 02/19/2015  . Acute respiratory failure ([place])   . Diabetes mellitus without complication (HCC)    Type 2, W/o complications  . DVT (deep venous thrombosis) ([place]) 04/05/2015  . Dysphagia   . Epilepsy ([place])   . GERD (gastroesophageal reflux disease)   . Hyperlipidemia   . Hypertension   . IBS (irritable bowel syndrome)   . Nontraumatic subarachnoid hemorrhage ([place])   . SAH (subarachnoid hemorrhage) ([place])   . Urinary retention     Past Surgical History:  Procedure Laterality Date  . ABDOMINAL SURGERY    . ANEURYSM COILING    . COLONOSCOPY N/A 02/20/2015   Procedure: COLONOSCOPY;  Surgeon: Gatha Mayer, MD;  Location: [facility];  Service: Endoscopy;  Laterality: N/A;  . ESOPHAGOGASTRODUODENOSCOPY (EGD) WITH PROPOFOL N/A 02/02/2015   Procedure: ESOPHAGOGASTRODUODENOSCOPY (EGD) WITH PROPOFOL;  Surgeon: Judeth Horn, MD;  Location: [facility];  Service: General;  Laterality: N/A;  . IR GENERIC HISTORICAL  08/26/2015   IR GASTRIC TUBE PERC CHG W/O IMG GUIDE 08/26/2015 Aletta Edouard, MD [facility]  . IR GENERIC HISTORICAL  09/14/2015   IR REPLC GASTRO/COLONIC TUBE PERCUT W/FLUORO 09/14/2015 Darrell K  Allred, PA-C [facility]  . IR REPLACE G-TUBE SIMPLE WO FLUORO  06/08/2016  . IR REPLACE G-TUBE SIMPLE WO FLUORO  10/24/2016  . IR REPLACE G-TUBE SIMPLE WO FLUORO  01/07/2017  . PEG PLACEMENT N/A 02/02/2015   Procedure: PERCUTANEOUS ENDOSCOPIC GASTROSTOMY (PEG) PLACEMENT;  Surgeon: Judeth Horn, MD;  Location: [facility];  Service: General;  Laterality: N/A;  . RADIOLOGY WITH ANESTHESIA N/A 01/13/2015   Procedure: RADIOLOGY WITH ANESTHESIA;  Surgeon: Consuella Lose, MD;  Location: [facility];  Service: Radiology;  Laterality: N/A;  . TRACHEOSTOMY      Social History   Socioeconomic History  . Marital status: Single    Spouse name: Not on file  . Number of children: Not on file  . Years of education: Not on file  . Highest education level: Not on file  Social Needs  . Financial resource strain: Not on file  . Food insecurity - worry: Not on file  . Food insecurity - inability: Not on file  . Transportation needs - medical: Not on file  . Transportation needs - non-medical: Not on file  Occupational History  . Not on file  Tobacco Use  . Smoking status: Former Research scientist (life sciences)  . Smokeless tobacco: Never Used  Substance and Sexual Activity  . Alcohol use: No  . Drug use: No  . Sexual activity: Not on file  Other Topics Concern  . Not on file  Social History Narrative  .  Not on file   Family History  Problem Relation Age of Onset  . Hypertension Other       VITAL SIGNS BP 122/68   Pulse 88   Temp 98.7 F (37.1 C)   Resp 18   Ht 5' (1.524 m)   Wt 128 lb 4.8 oz (58.2 kg)   LMP  (LMP Unknown)   SpO2 98%   BMI 25.06 kg/m   Outpatient Encounter Medications as of 03/13/2017  Medication Sig  . acetaminophen (TYLENOL) 325 MG tablet Place 650 mg into feeding tube daily. And every 4 hours as needed for mild pain,fever  . Amino Acids-Protein Hydrolys (FEEDING SUPPLEMENT, PRO-STAT SUGAR FREE 64,) LIQD Place 60 mLs into feeding tube 3 (three) times daily with meals.  . bethanechol  (URECHOLINE) 10 MG tablet 10 mg by PEG Tube route every 8 (eight) hours.   . enoxaparin (LOVENOX) 60 MG/0.6ML injection Inject 60 mg into the skin 2 (two) times daily.   Marland Kitchen gabapentin (NEURONTIN) 100 MG capsule Take 100 mg by mouth 2 (two) times daily. Hold if Lathargic  . loperamide (IMODIUM A-D) 2 MG tablet Take 1 tablet (2 mg total) by mouth 4 (four) times daily as needed for diarrhea or loose stools.  . Multiple Vitamins-Minerals (DECUBI-VITE) CAPS Place 1 capsule into feeding tube daily.   . Nutritional Supplements (FEEDING SUPPLEMENT, JEVITY 1.5 CAL/FIBER,) LIQD Place 1,000 mLs into feeding tube continuous.  . ondansetron (ZOFRAN) 4 MG tablet Take 4 mg by mouth daily.  Marland Kitchen oxyCODONE (ROXICODONE) 5 MG immediate release tablet Place 1 tablet (5 mg total) into feeding tube every 4 (four) hours as needed for severe pain.  . Pantoprazole Sodium POWD Give 1 packet (40 mg) via G-Tube one time a day for GERD  . polyethylene glycol (MIRALAX / GLYCOLAX) packet Place 17 g into feeding tube daily.  . traZODone (DESYREL) 50 MG tablet Take 1 tablet (50 mg total) by mouth at bedtime.  Marland Kitchen UNABLE TO FIND HSG Puree diet - HSG Puree texture, Regular consistency, may have pleasure finger foods with supervision  . WATER FOR INJECTION STERILE IJ Place 30-60 mLs into feeding tube. Flush with 30-60 ml water before and after meds, before initiating feedings or when there is an interruption of feeding to maintain patency.  Also flush every shift with 5-10 ml water between each medication  . Water For Irrigation, Sterile (FREE WATER) SOLN Place 200 mLs into feeding tube 4 (four) times daily.  Marland Kitchen levETIRAcetam (KEPPRA) 100 MG/ML solution Place 5 mLs (500 mg total) into feeding tube 2 (two) times daily.  . [DISCONTINUED] liver oil-zinc oxide (DESITIN) 40 % ointment Apply topically 4 (four) times daily.   No facility-administered encounter medications on file as of 03/13/2017.      SIGNIFICANT DIAGNOSTIC  EXAMS  PREVIOUS  02-17-15: ct angio of chest: 1. Extensive pulmonary emboli involving distal main pulmonary arteries extending into lobar, segmental and subsegmental sized branches throughout the lungs bilaterally. At this time, there are no overt findings to suggest right heart strain. 2. Cardiomegaly with left ventricular concentric hypertrophy. 3. Dependent atelectasis throughout the lower lobes of the lungs bilaterally.  02-18-15: bilateral lower extremity doppler: Findings consistent with acute deep vein thrombosis involving the left common femoral vein, left proximal profunda femoris vein, and left proximal femoral vein. Incidental findings are consistent with: enlarged lymph node on the right. - No evidence of deep vein thrombosis involving the right lower extremity. - No evidence of Baker&'s cyst on the right or  left.  02-19-15: pelvic ultrasound: 1. Uterus not identified, presumed hysterectomy. No mass or free fluid seen within the midline pelvis. 2. Neither ovary is seen, perhaps bilateral oophorectomies, perhaps obscured by the fairly prominent fluid-filled bowel loops in the pelvis. No mass or free fluid seen within either adnexal region.  06-14-16: diagnostic right mammogram: benign cyst  08-02-16: left upper extremity doppler: negative for dvt  08-02-16: left hand x-ray: mild osteoarthritis   08-15-16: left hand x-ray: mild osteoarthritis no change   09-04-16: chest x-ray:  cardio retrocardiac opacities  TODAY:   12-31-16: right lower extremity doppler: no thrombus identified; unfortunately obscured exam. Right superficial femoral vein cannot be identified due to patient positioning. The profunda femoral and popliteal were patient. Right posterior tibial was obscured.   02-14-17: chest x-ray: no acute cardiopulmonary process    02-16-17: chest x-ray: No focal consolidation. Probable atelectatic changes of the right upper lobe.   02-22-17: ct of abdomen and pelvis Bladder wall  thickening with hazy irregular perivesicular margins compatible with cystitis; recommend correlation with urinalysis. Minimal sigmoid diverticulosis. RIGHT lower lobe infiltrate question pneumonia or aspiration. No definite acute intra-abdominal or intrapelvic abnormalities otherwise seen.  Aortic Atherosclerosis    LABS REVIEWED: PREVIOUS   04-26-16: wbc 6.9; hgb 12.;6 hct 38.0; mcv 94.4; plt 231; glucose 94; bun 10.0; creat 0.65; k+ 4.2; na++ 142; liver normal albumin 4.2; chol 177; lld 124; trig 101; hdl 33  06-14-16: wbc 8.4; hgb 11.9; hct 36.6; mcv 95.4; plt 203; glucose 134; bun 30.7; creat 0.62; k+ 4.5 ;na++ 149; liver normal albumin 4.2; vit B 12: 521; folate 19.1; chol 155; ldl 85; trig 181; hdl 34; hgb a1c 5.7  8-18: hgb a1c 5.4 09-24-16: urine micro-albumin <1.2 10-11-16: glucose 133; bun 25.6; creat 0.63; k+ 4.8; na++ 140; ca 9.9; liver normal albumin 4.3 10-22-16: glucose 131; bun 29.0; creat 0.62; k+ 4.7; na++ 141; ca 9.8; liver normal albumin 4.3 01-06-17: wb 8.1; hgb 15.0; hct 45.0; mcv  93.2; plt 199; glucose 99; bun 20; creat 0.74; k+ 4.0; na++ 142; ca 10.7   TODAY   01-25-17: hgb a1c 6.1; chol 187; ldl 117; trig 184; hdl 31  02-14-17: wbc 28.0; hgb 13.9; hct 41.0; mcv 90.5 ;plt 297  glucose 165; bun 34.4; creat 1.04; k+ 4.1; NA++ 156; ALT 38 AST 22; alk phos  133; albumin 4.2 blood culture: no growth urine culture no growth 02-14-17 (ED):  Wbc 33.7; hgb 13.4; hct 42.8; mcv 99.5; plt 279 glucose 210; bun 41; creat 1.37; k+ 3.6; na++ 155; ca 9.9; liver normal albumin 3.4  02-15-17: blood culture: no growth; flu: neg HIV: nr; hgb a1c 6.1 02-17-17: wbc 18.4; hgb 9.4; hct 31.6; mcv 97.8; plt 214; glucose 100; bun 23; creat 0.75; k+ 3.2; na++150 liver normal albumin 2.4 mag 2.3 phos 3.0  02-22-17: wbc 13.3; hgb 10.5; hct 33.6; mcv 94.6; plt 424; glucose 102; bun 16; creat 0.58; k+ 4.0; na++ 138; ca 9.9 02-24-17: wbc  11.2; hgb 11.2; hct 35.2; mcv 95.7; plt 387; glucose 115; bun 11; creat  0.57; k+ 4.2; na++ 138; ca 9.4  Review of Systems  Unable to perform ROS: Other (expressive aphasia )    Physical Exam  Constitutional: She appears well-developed and well-nourished. No distress.  HENT:  Does grind teeth. TMJ pronounced to palpation She does not open mouth wide for examination Has copious amount of oral secretions present.   Neck: Carotid bruit is present. No thyromegaly present.  Bilateral   Pulmonary/Chest: Effort  normal and breath sounds normal. No respiratory distress.  Abdominal: Soft. Bowel sounds are normal. She exhibits no distension. There is no tenderness.  Peg tube present without signs of infection present   Musculoskeletal: She exhibits no edema.  Right hemiparesis   Lymphadenopathy:    She has no cervical adenopathy.  Neurological: She is alert.  Skin: Skin is warm and dry. She is not diaphoretic.  Psychiatric: She has a normal mood and affect.   ASSESSMENT/ PLAN:  TODAY:   1. Hypersecretion of saliva 2. Bruxism: teeth grinding  She is on dental list to be seen for her grinding teeth Will begin scopolamine patch every 3 days for her oral secretions and will monitor her status     MD is aware of resident's narcotic use and is in agreement with current plan of care. We will attempt to wean resident as apropriate   Ok Edwards NP [facility]  Contact [phone] Monday through Friday 8am- 5pm  After hours call [phone]

## 2017-03-14 DIAGNOSIS — K117 Disturbances of salivary secretion: Secondary | ICD-10-CM | POA: Insufficient documentation

## 2017-03-14 DIAGNOSIS — F458 Other somatoform disorders: Secondary | ICD-10-CM | POA: Insufficient documentation

## 2017-03-25 ENCOUNTER — Non-Acute Institutional Stay (SKILLED_NURSING_FACILITY): Payer: Medicaid Other | Admitting: Adult Health

## 2017-03-25 ENCOUNTER — Encounter: Payer: Self-pay | Admitting: Adult Health

## 2017-03-25 DIAGNOSIS — F458 Other somatoform disorders: Secondary | ICD-10-CM | POA: Diagnosis not present

## 2017-03-25 DIAGNOSIS — I639 Cerebral infarction, unspecified: Secondary | ICD-10-CM

## 2017-03-25 DIAGNOSIS — R1314 Dysphagia, pharyngoesophageal phase: Secondary | ICD-10-CM

## 2017-03-25 NOTE — Progress Notes (Signed)
Location:   [facility] Room Number: 206 Place of Service:  SNF (31)   CODE STATUS:full code (MOST reviewed 08-21-16)   No Known Allergies  Chief Complaint  Patient presents with  . Acute Visit    care plan meeting     HPI:  We have come together for her routine care plan meeting. Her family is present for the meeting. There are no nursing concerns. She is gaining weight and is up to 130 pounds. Dietician has reviewed her caloric needs. She is having mouth pain and is grinding her teeth. She will not allow for an oral exam to be done. More than likely she has gum and dental disease. Her family is aware. She is due to see the dentist in the AM. We did review her medical status. Her MOST will be reviewed at her next care plan. She is being seen by occupational therapy for her left hand splinting. She continues to have copious amounts of oral secretions; has not yet started on scopolamine.   Past Medical History:  Diagnosis Date  . Acute pulmonary embolism ([place]) 02/19/2015  . Acute respiratory failure ([place])   . Diabetes mellitus without complication (HCC)    Type 2, W/o complications  . DVT (deep venous thrombosis) ([place]) 04/05/2015  . Dysphagia   . Epilepsy ([place])   . GERD (gastroesophageal reflux disease)   . Hyperlipidemia   . Hypertension   . IBS (irritable bowel syndrome)   . Nontraumatic subarachnoid hemorrhage ([place])   . SAH (subarachnoid hemorrhage) ([place])   . Urinary retention     Past Surgical History:  Procedure Laterality Date  . ABDOMINAL SURGERY    . ANEURYSM COILING    . COLONOSCOPY N/A 02/20/2015   Procedure: COLONOSCOPY;  Surgeon: Gatha Mayer, MD;  Location: [facility];  Service: Endoscopy;  Laterality: N/A;  . ESOPHAGOGASTRODUODENOSCOPY (EGD) WITH PROPOFOL N/A 02/02/2015   Procedure: ESOPHAGOGASTRODUODENOSCOPY (EGD) WITH PROPOFOL;  Surgeon: Judeth Horn, MD;  Location: [facility];  Service: General;  Laterality: N/A;  . IR GENERIC HISTORICAL   08/26/2015   IR GASTRIC TUBE PERC CHG W/O IMG GUIDE 08/26/2015 Aletta Edouard, MD [facility]  . IR GENERIC HISTORICAL  09/14/2015   IR REPLC GASTRO/COLONIC TUBE PERCUT W/FLUORO 09/14/2015 Darrell K Allred, PA-C [facility]  . IR REPLACE G-TUBE SIMPLE WO FLUORO  06/08/2016  . IR REPLACE G-TUBE SIMPLE WO FLUORO  10/24/2016  . IR REPLACE G-TUBE SIMPLE WO FLUORO  01/07/2017  . PEG PLACEMENT N/A 02/02/2015   Procedure: PERCUTANEOUS ENDOSCOPIC GASTROSTOMY (PEG) PLACEMENT;  Surgeon: Judeth Horn, MD;  Location: [facility];  Service: General;  Laterality: N/A;  . RADIOLOGY WITH ANESTHESIA N/A 01/13/2015   Procedure: RADIOLOGY WITH ANESTHESIA;  Surgeon: Consuella Lose, MD;  Location: [facility];  Service: Radiology;  Laterality: N/A;  . TRACHEOSTOMY      Social History   Socioeconomic History  . Marital status: Single    Spouse name: Not on file  . Number of children: Not on file  . Years of education: Not on file  . Highest education level: Not on file  Social Needs  . Financial resource strain: Not on file  . Food insecurity - worry: Not on file  . Food insecurity - inability: Not on file  . Transportation needs - medical: Not on file  . Transportation needs - non-medical: Not on file  Occupational History  . Not on file  Tobacco Use  . Smoking status: Former Research scientist (life sciences)  .  Smokeless tobacco: Never Used  Substance and Sexual Activity  . Alcohol use: No  . Drug use: No  . Sexual activity: Not on file  Other Topics Concern  . Not on file  Social History Narrative  . Not on file   Family History  Problem Relation Age of Onset  . Hypertension Other       VITAL SIGNS BP 110/82   Pulse 78   Temp 98.1 F (36.7 C)   Resp 16   Ht 5' (1.524 m)   Wt 130 lb (59 kg)   LMP  (LMP Unknown)   SpO2 99%   BMI 25.39 kg/m   Outpatient Encounter Medications as of 03/25/2017  Medication Sig  . acetaminophen (TYLENOL) 325 MG tablet Place 650 mg into feeding tube daily. And every 4 hours as  needed for mild pain,fever  . Amino Acids-Protein Hydrolys (FEEDING SUPPLEMENT, PRO-STAT SUGAR FREE 64,) LIQD Place 60 mLs into feeding tube 3 (three) times daily with meals.  . bethanechol (URECHOLINE) 10 MG tablet 10 mg by PEG Tube route every 8 (eight) hours.   . enoxaparin (LOVENOX) 60 MG/0.6ML injection Inject 60 mg into the skin 2 (two) times daily.   Marland Kitchen gabapentin (NEURONTIN) 100 MG capsule Take 100 mg by mouth 2 (two) times daily. Hold if Lathargic  . levETIRAcetam (KEPPRA) 100 MG/ML solution Place 5 mLs (500 mg total) into feeding tube 2 (two) times daily.  Marland Kitchen loperamide (IMODIUM A-D) 2 MG tablet Take 1 tablet (2 mg total) by mouth 4 (four) times daily as needed for diarrhea or loose stools.  . Multiple Vitamins-Minerals (DECUBI-VITE) CAPS Place 1 capsule into feeding tube daily.   . Nutritional Supplements (FEEDING SUPPLEMENT, JEVITY 1.5 CAL/FIBER,) LIQD Place 1,000 mLs into feeding tube continuous. (Patient taking differently: Place 1,000 mLs into feeding tube continuous. 100 cc per hour for 12 hours from 8 PM through 8 AM)  . ondansetron (ZOFRAN) 4 MG tablet Take 4 mg by mouth daily.  Marland Kitchen oxyCODONE (ROXICODONE) 5 MG immediate release tablet Place 1 tablet (5 mg total) into feeding tube every 4 (four) hours as needed for severe pain.  . Pantoprazole Sodium POWD Give 1 packet (40 mg) via G-Tube one time a day for GERD  . polyethylene glycol (MIRALAX / GLYCOLAX) packet Place 17 g into feeding tube daily.  . traZODone (DESYREL) 50 MG tablet Take 1 tablet (50 mg total) by mouth at bedtime.  . WATER FOR INJECTION STERILE IJ Place 30-60 mLs into feeding tube. Flush with 30-60 ml water before and after meds, before initiating feedings or when there is an interruption of feeding to maintain patency.  Also flush every shift with 5-10 ml water between each medication  . Water For Irrigation, Sterile (FREE WATER) SOLN Place 200 mLs into feeding tube 4 (four) times daily.  . [DISCONTINUED] UNABLE TO FIND  HSG Puree diet - HSG Puree texture, Regular consistency, may have pleasure finger foods with supervision   No facility-administered encounter medications on file as of 03/25/2017.      SIGNIFICANT DIAGNOSTIC EXAMS  PREVIOUS  02-17-15: ct angio of chest: 1. Extensive pulmonary emboli involving distal main pulmonary arteries extending into lobar, segmental and subsegmental sized branches throughout the lungs bilaterally. At this time, there are no overt findings to suggest right heart strain. 2. Cardiomegaly with left ventricular concentric hypertrophy. 3. Dependent atelectasis throughout the lower lobes of the lungs bilaterally.  02-18-15: bilateral lower extremity doppler: Findings consistent with acute deep vein thrombosis involving the  left common femoral vein, left proximal profunda femoris vein, and left proximal femoral vein. Incidental findings are consistent with: enlarged lymph node on the right. - No evidence of deep vein thrombosis involving the right lower extremity. - No evidence of Baker&'s cyst on the right or left.  02-19-15: pelvic ultrasound: 1. Uterus not identified, presumed hysterectomy. No mass or free fluid seen within the midline pelvis. 2. Neither ovary is seen, perhaps bilateral oophorectomies, perhaps obscured by the fairly prominent fluid-filled bowel loops in the pelvis. No mass or free fluid seen within either adnexal region.  06-14-16: diagnostic right mammogram: benign cyst  08-02-16: left upper extremity doppler: negative for dvt  08-02-16: left hand x-ray: mild osteoarthritis   08-15-16: left hand x-ray: mild osteoarthritis no change   09-04-16: chest x-ray:  cardio retrocardiac opacities  12-31-16: right lower extremity doppler: no thrombus identified; unfortunately obscured exam. Right superficial femoral vein cannot be identified due to patient positioning. The profunda femoral and popliteal were patient. Right posterior tibial was obscured.   02-14-17: chest  x-ray: no acute cardiopulmonary process    02-16-17: chest x-ray: No focal consolidation. Probable atelectatic changes of the right upper lobe.   02-22-17: ct of abdomen and pelvis Bladder wall thickening with hazy irregular perivesicular margins compatible with cystitis; recommend correlation with urinalysis. Minimal sigmoid diverticulosis. RIGHT lower lobe infiltrate question pneumonia or aspiration. No definite acute intra-abdominal or intrapelvic abnormalities otherwise seen.  Aortic Atherosclerosis  NO NEW EXAMS     LABS REVIEWED: PREVIOUS   04-26-16: wbc 6.9; hgb 12.;6 hct 38.0; mcv 94.4; plt 231; glucose 94; bun 10.0; creat 0.65; k+ 4.2; na++ 142; liver normal albumin 4.2; chol 177; lld 124; trig 101; hdl 33  06-14-16: wbc 8.4; hgb 11.9; hct 36.6; mcv 95.4; plt 203; glucose 134; bun 30.7; creat 0.62; k+ 4.5 ;na++ 149; liver normal albumin 4.2; vit B 12: 521; folate 19.1; chol 155; ldl 85; trig 181; hdl 34; hgb a1c 5.7  8-18: hgb a1c 5.4 09-24-16: urine micro-albumin <1.2 10-11-16: glucose 133; bun 25.6; creat 0.63; k+ 4.8; na++ 140; ca 9.9; liver normal albumin 4.3 10-22-16: glucose 131; bun 29.0; creat 0.62; k+ 4.7; na++ 141; ca 9.8; liver normal albumin 4.3 01-06-17: wb 8.1; hgb 15.0; hct 45.0; mcv  93.2; plt 199; glucose 99; bun 20; creat 0.74; k+ 4.0; na++ 142; ca 10.7  01-25-17: hgb a1c 6.1; chol 187; ldl 117; trig 184; hdl 31  02-14-17: wbc 28.0; hgb 13.9; hct 41.0; mcv 90.5 ;plt 297  glucose 165; bun 34.4; creat 1.04; k+ 4.1; NA++ 156; ALT 38 AST 22; alk phos  133; albumin 4.2 blood culture: no growth urine culture no growth 02-14-17 (ED):  Wbc 33.7; hgb 13.4; hct 42.8; mcv 99.5; plt 279 glucose 210; bun 41; creat 1.37; k+ 3.6; na++ 155; ca 9.9; liver normal albumin 3.4  02-15-17: blood culture: no growth; flu: neg HIV: nr; hgb a1c 6.1 02-17-17: wbc 18.4; hgb 9.4; hct 31.6; mcv 97.8; plt 214; glucose 100; bun 23; creat 0.75; k+ 3.2; na++150 liver normal albumin 2.4 mag 2.3 phos 3.0    02-22-17: wbc 13.3; hgb 10.5; hct 33.6; mcv 94.6; plt 424; glucose 102; bun 16; creat 0.58; k+ 4.0; na++ 138; ca 9.9 02-24-17: wbc  11.2; hgb 11.2; hct 35.2; mcv 95.7; plt 387; glucose 115; bun 11; creat 0.57; k+ 4.2; na++ 138; ca 9.4  NO NEW LABS    Review of Systems  Unable to perform ROS: Other (expressive aphasia )  Physical Exam  Constitutional: She appears well-developed and well-nourished. No distress.  HENT:  Does grind teeth. TMJ pronounced to palpation She does not open mouth wide for examination Has copious amount of oral secretions present.    Neck: Carotid bruit is present. No thyromegaly present.  Bilateral   Cardiovascular: Normal rate, regular rhythm, normal heart sounds and intact distal pulses.  Pulmonary/Chest: Effort normal and breath sounds normal. No respiratory distress.  Abdominal: Soft. Bowel sounds are normal. She exhibits no distension. There is no tenderness.  Peg tube present without signs of infection present   Musculoskeletal: She exhibits no edema.  Left hemiparesis  Is wearing left hand splint   Lymphadenopathy:    She has no cervical adenopathy.  Neurological: She is alert.  Skin: Skin is warm and dry. She is not diaphoretic.  Psychiatric: She has a normal mood and affect.    ASSESSMENT/ PLAN:  TODAY:   1. CVA 2. Bruxism 3. Dysphagia pharynogoesophageal phase   Will await her dental appointment in the AM Will continue her current medications She is to start her scopolamine patch  OT will end this week  Time spent with patient and family: 45 minutes: did discuss her overall health status including blood pressure and hgb a1c. Did discuss her medications; tube feeing; po intake and her oral health. family did verbalize understanding.     MD is aware of resident's narcotic use and is in agreement with current plan of care. We will attempt to wean resident as apropriate   Ok Edwards NP [facility]  Contact [phone]  Monday through Friday 8am- 5pm  After hours call [phone]

## 2017-03-26 ENCOUNTER — Encounter: Payer: Self-pay | Admitting: Adult Health

## 2017-03-26 ENCOUNTER — Non-Acute Institutional Stay (SKILLED_NURSING_FACILITY): Payer: Medicaid Other | Admitting: Adult Health

## 2017-03-26 DIAGNOSIS — K0500 Acute gingivitis, plaque induced: Secondary | ICD-10-CM | POA: Diagnosis not present

## 2017-03-26 DIAGNOSIS — F458 Other somatoform disorders: Secondary | ICD-10-CM | POA: Diagnosis not present

## 2017-03-26 NOTE — Progress Notes (Signed)
Location:   [facility] Room Number: 208 Place of Service:  SNF (31)   CODE STATUS: full code (MOST reviewed 08-21-16)  No Known Allergies  Chief Complaint  Patient presents with  . Acute Visit    follow up with dentist     HPI:  She has been seen by Dr. Sheppard Coil for her grinding and dental disease. She teeth that are rotting. Her grinding is coming trying to sooth her pain. He feels as though if he can pull her back teeth that this should stop the grinding. The concern is that she is unable to open her mouth ar enough for him to work. He will attempt to remove her teeth here in one month; if he is unable despite sedating her will need to go to an oral surgeon to have all her teeth removed. Her son has been informed ans is in agreement with this plan. She has significant gingivitis and will need abt for 60 days. She is unable to participate in the hpi or ros.  There are no reports of fever present; she has started her scopolamine patch today and continues with excessive saliva. She continues to allow for her tube feeding.    Past Medical History:  Diagnosis Date  . Acute pulmonary embolism ([place]) 02/19/2015  . Acute respiratory failure ([place])   . Diabetes mellitus without complication (HCC)    Type 2, W/o complications  . DVT (deep venous thrombosis) ([place]) 04/05/2015  . Dysphagia   . Epilepsy ([place])   . GERD (gastroesophageal reflux disease)   . Hyperlipidemia   . Hypertension   . IBS (irritable bowel syndrome)   . Nontraumatic subarachnoid hemorrhage ([place])   . SAH (subarachnoid hemorrhage) ([place])   . Urinary retention     Past Surgical History:  Procedure Laterality Date  . ABDOMINAL SURGERY    . ANEURYSM COILING    . COLONOSCOPY N/A 02/20/2015   Procedure: COLONOSCOPY;  Surgeon: Gatha Mayer, MD;  Location: [facility];  Service: Endoscopy;  Laterality: N/A;  . ESOPHAGOGASTRODUODENOSCOPY (EGD) WITH PROPOFOL N/A 02/02/2015   Procedure:  ESOPHAGOGASTRODUODENOSCOPY (EGD) WITH PROPOFOL;  Surgeon: Judeth Horn, MD;  Location: [facility];  Service: General;  Laterality: N/A;  . IR GENERIC HISTORICAL  08/26/2015   IR GASTRIC TUBE PERC CHG W/O IMG GUIDE 08/26/2015 Aletta Edouard, MD [facility]  . IR GENERIC HISTORICAL  09/14/2015   IR REPLC GASTRO/COLONIC TUBE PERCUT W/FLUORO 09/14/2015 Darrell K Allred, PA-C [facility]  . IR REPLACE G-TUBE SIMPLE WO FLUORO  06/08/2016  . IR REPLACE G-TUBE SIMPLE WO FLUORO  10/24/2016  . IR REPLACE G-TUBE SIMPLE WO FLUORO  01/07/2017  . PEG PLACEMENT N/A 02/02/2015   Procedure: PERCUTANEOUS ENDOSCOPIC GASTROSTOMY (PEG) PLACEMENT;  Surgeon: Judeth Horn, MD;  Location: [facility];  Service: General;  Laterality: N/A;  . RADIOLOGY WITH ANESTHESIA N/A 01/13/2015   Procedure: RADIOLOGY WITH ANESTHESIA;  Surgeon: Consuella Lose, MD;  Location: [facility];  Service: Radiology;  Laterality: N/A;  . TRACHEOSTOMY      Social History   Socioeconomic History  . Marital status: Single    Spouse name: Not on file  . Number of children: Not on file  . Years of education: Not on file  . Highest education level: Not on file  Social Needs  . Financial resource strain: Not on file  . Food insecurity - worry: Not on file  . Food insecurity - inability: Not on file  . Transportation needs -  medical: Not on file  . Transportation needs - non-medical: Not on file  Occupational History  . Not on file  Tobacco Use  . Smoking status: Former Research scientist (life sciences)  . Smokeless tobacco: Never Used  Substance and Sexual Activity  . Alcohol use: No  . Drug use: No  . Sexual activity: Not on file  Other Topics Concern  . Not on file  Social History Narrative  . Not on file   Family History  Problem Relation Age of Onset  . Hypertension Other       VITAL SIGNS BP 110/82   Pulse 78   Temp 98.1 F (36.7 C)   Resp 18   Ht 5' (1.524 m)   Wt 130 lb (59 kg)   LMP  (LMP Unknown)   SpO2 98%   BMI 25.39 kg/m     Outpatient Encounter Medications as of 03/26/2017  Medication Sig  . acetaminophen (TYLENOL) 325 MG tablet Place 650 mg into feeding tube daily. And every 4 hours as needed for mild pain,fever  . Amino Acids-Protein Hydrolys (FEEDING SUPPLEMENT, PRO-STAT SUGAR FREE 64,) LIQD Place 60 mLs into feeding tube 3 (three) times daily with meals.  . bethanechol (URECHOLINE) 10 MG tablet 10 mg by PEG Tube route every 8 (eight) hours.   . enoxaparin (LOVENOX) 60 MG/0.6ML injection Inject 60 mg into the skin 2 (two) times daily.   Marland Kitchen gabapentin (NEURONTIN) 100 MG capsule Take 100 mg by mouth 2 (two) times daily. Hold if Lathargic  . levETIRAcetam (KEPPRA) 100 MG/ML solution Place 5 mLs (500 mg total) into feeding tube 2 (two) times daily.  Marland Kitchen loperamide (IMODIUM A-D) 2 MG tablet Take 1 tablet (2 mg total) by mouth 4 (four) times daily as needed for diarrhea or loose stools.  . Multiple Vitamins-Minerals (DECUBI-VITE) CAPS Place 1 capsule into feeding tube daily.   . Nutritional Supplements (FEEDING SUPPLEMENT, JEVITY 1.5 CAL/FIBER,) LIQD Place 1,000 mLs into feeding tube continuous.  . ondansetron (ZOFRAN) 4 MG tablet Take 4 mg by mouth daily.  Marland Kitchen oxyCODONE (ROXICODONE) 5 MG immediate release tablet Place 1 tablet (5 mg total) into feeding tube every 4 (four) hours as needed for severe pain.  . Pantoprazole Sodium POWD Give 1 packet (40 mg) via G-Tube one time a day for GERD  . polyethylene glycol (MIRALAX / GLYCOLAX) packet Place 17 g into feeding tube daily.  . traZODone (DESYREL) 50 MG tablet Take 1 tablet (50 mg total) by mouth at bedtime.  . WATER FOR INJECTION STERILE IJ Place 30-60 mLs into feeding tube. Flush with 30-60 ml water before and after meds, before initiating feedings or when there is an interruption of feeding to maintain patency.  Also flush every shift with 5-10 ml water between each medication  . Water For Irrigation, Sterile (FREE WATER) SOLN Place 200 mLs into feeding tube 4 (four)  times daily.   No facility-administered encounter medications on file as of 03/26/2017.      SIGNIFICANT DIAGNOSTIC EXAMS   PREVIOUS  02-17-15: ct angio of chest: 1. Extensive pulmonary emboli involving distal main pulmonary arteries extending into lobar, segmental and subsegmental sized branches throughout the lungs bilaterally. At this time, there are no overt findings to suggest right heart strain. 2. Cardiomegaly with left ventricular concentric hypertrophy. 3. Dependent atelectasis throughout the lower lobes of the lungs bilaterally.  02-18-15: bilateral lower extremity doppler: Findings consistent with acute deep vein thrombosis involving the left common femoral vein, left proximal profunda femoris vein, and left  proximal femoral vein. Incidental findings are consistent with: enlarged lymph node on the right. - No evidence of deep vein thrombosis involving the right lower extremity. - No evidence of Baker&'s cyst on the right or left.  02-19-15: pelvic ultrasound: 1. Uterus not identified, presumed hysterectomy. No mass or free fluid seen within the midline pelvis. 2. Neither ovary is seen, perhaps bilateral oophorectomies, perhaps obscured by the fairly prominent fluid-filled bowel loops in the pelvis. No mass or free fluid seen within either adnexal region.  06-14-16: diagnostic right mammogram: benign cyst  08-02-16: left upper extremity doppler: negative for dvt  08-02-16: left hand x-ray: mild osteoarthritis   08-15-16: left hand x-ray: mild osteoarthritis no change   09-04-16: chest x-ray:  cardio retrocardiac opacities  12-31-16: right lower extremity doppler: no thrombus identified; unfortunately obscured exam. Right superficial femoral vein cannot be identified due to patient positioning. The profunda femoral and popliteal were patient. Right posterior tibial was obscured.   02-14-17: chest x-ray: no acute cardiopulmonary process    02-16-17: chest x-ray: No focal consolidation.  Probable atelectatic changes of the right upper lobe.   02-22-17: ct of abdomen and pelvis Bladder wall thickening with hazy irregular perivesicular margins compatible with cystitis; recommend correlation with urinalysis. Minimal sigmoid diverticulosis. RIGHT lower lobe infiltrate question pneumonia or aspiration. No definite acute intra-abdominal or intrapelvic abnormalities otherwise seen.  Aortic Atherosclerosis  NO NEW EXAMS     LABS REVIEWED: PREVIOUS   04-26-16: wbc 6.9; hgb 12.;6 hct 38.0; mcv 94.4; plt 231; glucose 94; bun 10.0; creat 0.65; k+ 4.2; na++ 142; liver normal albumin 4.2; chol 177; lld 124; trig 101; hdl 33  06-14-16: wbc 8.4; hgb 11.9; hct 36.6; mcv 95.4; plt 203; glucose 134; bun 30.7; creat 0.62; k+ 4.5 ;na++ 149; liver normal albumin 4.2; vit B 12: 521; folate 19.1; chol 155; ldl 85; trig 181; hdl 34; hgb a1c 5.7  8-18: hgb a1c 5.4 09-24-16: urine micro-albumin <1.2 10-11-16: glucose 133; bun 25.6; creat 0.63; k+ 4.8; na++ 140; ca 9.9; liver normal albumin 4.3 10-22-16: glucose 131; bun 29.0; creat 0.62; k+ 4.7; na++ 141; ca 9.8; liver normal albumin 4.3 01-06-17: wb 8.1; hgb 15.0; hct 45.0; mcv  93.2; plt 199; glucose 99; bun 20; creat 0.74; k+ 4.0; na++ 142; ca 10.7  01-25-17: hgb a1c 6.1; chol 187; ldl 117; trig 184; hdl 31  02-14-17: wbc 28.0; hgb 13.9; hct 41.0; mcv 90.5 ;plt 297  glucose 165; bun 34.4; creat 1.04; k+ 4.1; NA++ 156; ALT 38 AST 22; alk phos  133; albumin 4.2 blood culture: no growth urine culture no growth 02-14-17 (ED):  Wbc 33.7; hgb 13.4; hct 42.8; mcv 99.5; plt 279 glucose 210; bun 41; creat 1.37; k+ 3.6; na++ 155; ca 9.9; liver normal albumin 3.4  02-15-17: blood culture: no growth; flu: neg HIV: nr; hgb a1c 6.1 02-17-17: wbc 18.4; hgb 9.4; hct 31.6; mcv 97.8; plt 214; glucose 100; bun 23; creat 0.75; k+ 3.2; na++150 liver normal albumin 2.4 mag 2.3 phos 3.0  02-22-17: wbc 13.3; hgb 10.5; hct 33.6; mcv 94.6; plt 424; glucose 102; bun 16; creat 0.58; k+  4.0; na++ 138; ca 9.9 02-24-17: wbc  11.2; hgb 11.2; hct 35.2; mcv 95.7; plt 387; glucose 115; bun 11; creat 0.57; k+ 4.2; na++ 138; ca 9.4  NO NEW LABS    Review of Systems  Unable to perform ROS: Other (expressive aphasia )     Physical Exam  Constitutional: She appears well-developed and well-nourished. No distress.  HENT:  Does grind teeth. TMJ pronounced to palpation She does not open mouth wide for examination Has copious amount of oral secretions present.  Has gingivitis   Neck: Normal range of motion. Carotid bruit is present. No thyromegaly present.  Bilateral   Cardiovascular: Normal rate, regular rhythm, normal heart sounds and intact distal pulses.  Pulmonary/Chest: Effort normal and breath sounds normal. No respiratory distress.  Abdominal: Soft. Bowel sounds are normal. She exhibits no distension. There is no tenderness.  Peg tube present without signs of infection present.   Musculoskeletal: She exhibits no edema.  Left hemiparesis   Lymphadenopathy:    She has no cervical adenopathy.  Neurological: She is alert.  Skin: Skin is warm and dry. She is not diaphoretic.  Unstaged right heel: 4.4 x 6.6 NM cm   Psychiatric: She has a normal mood and affect.    ASSESSMENT/ PLAN:  TODAY:   1.  Bruxism 2. Gingivitis, acute plaque induced  Will set up for her to see Dr. Sheppard Coil in one month to attempt to pull out her back teeth. If this is not success will then send her to an oral surgeon for removal of all teeth Will begin tetracycline 250 mg twice daily for 60 days Will begin probiotic twice daily for 90 days.   MD is aware of resident's narcotic use and is in agreement with current plan of care. We will attempt to wean resident as apropriate   Ok Edwards NP [facility]  Contact [phone] Monday through Friday 8am- 5pm  After hours call [phone]

## 2017-03-28 ENCOUNTER — Non-Acute Institutional Stay (SKILLED_NURSING_FACILITY): Payer: Medicaid Other | Admitting: Adult Health

## 2017-03-28 ENCOUNTER — Encounter: Payer: Self-pay | Admitting: Adult Health

## 2017-03-28 DIAGNOSIS — I825Y2 Chronic embolism and thrombosis of unspecified deep veins of left proximal lower extremity: Secondary | ICD-10-CM

## 2017-03-28 DIAGNOSIS — R569 Unspecified convulsions: Secondary | ICD-10-CM

## 2017-03-28 DIAGNOSIS — E1149 Type 2 diabetes mellitus with other diabetic neurological complication: Secondary | ICD-10-CM

## 2017-03-28 DIAGNOSIS — I2782 Chronic pulmonary embolism: Secondary | ICD-10-CM

## 2017-03-28 DIAGNOSIS — F458 Other somatoform disorders: Secondary | ICD-10-CM | POA: Diagnosis not present

## 2017-03-28 DIAGNOSIS — K0500 Acute gingivitis, plaque induced: Secondary | ICD-10-CM | POA: Diagnosis not present

## 2017-03-28 NOTE — Progress Notes (Signed)
Location:  [facility] Room Number: 208 A Place of Service:  SNF (31)   CODE STATUS: Full Code  No Known Allergies  Chief Complaint  Patient presents with  . Medical Management of Chronic Issues    dvt; pe; bruxism; gingivitis; diabetes; seizures and hypersecretion of saliva.     HPI:  She is a 52 year old long term resident of this facility being seen for the management of her chronic illnesses: dvt; pe; bruxism; gingivitis; diabetes; seizures hypersecretion of saliva . She is unable to participate in the hpi or ros. She has pulled off her scopolamine patch. There are no reports of fevers; no reports of uncontrolled pain; no reports of behavioral issues. There are no nursing concerns at this time.    Past Medical History:  Diagnosis Date  . Acute pulmonary embolism ([place]) 02/19/2015  . Acute respiratory failure ([place])   . Diabetes mellitus without complication (HCC)    Type 2, W/o complications  . DVT (deep venous thrombosis) ([place]) 04/05/2015  . Dysphagia   . Epilepsy ([place])   . GERD (gastroesophageal reflux disease)   . Hyperlipidemia   . Hypertension   . IBS (irritable bowel syndrome)   . Nontraumatic subarachnoid hemorrhage ([place])   . SAH (subarachnoid hemorrhage) ([place])   . Urinary retention     Past Surgical History:  Procedure Laterality Date  . ABDOMINAL SURGERY    . ANEURYSM COILING    . COLONOSCOPY N/A 02/20/2015   Procedure: COLONOSCOPY;  Surgeon: Gatha Mayer, MD;  Location: [place];  Service: Endoscopy;  Laterality: N/A;  . ESOPHAGOGASTRODUODENOSCOPY (EGD) WITH PROPOFOL N/A 02/02/2015   Procedure: ESOPHAGOGASTRODUODENOSCOPY (EGD) WITH PROPOFOL;  Surgeon: Judeth Horn, MD;  Location: [place];  Service: General;  Laterality: N/A;  . IR GENERIC HISTORICAL  08/26/2015   IR GASTRIC TUBE PERC CHG W/O IMG GUIDE 08/26/2015 Aletta Edouard, MD [facility]  . IR GENERIC HISTORICAL  09/14/2015   IR REPLC GASTRO/COLONIC TUBE PERCUT W/FLUORO 09/14/2015  Darrell K Allred, PA-C [facility]  . IR REPLACE G-TUBE SIMPLE WO FLUORO  06/08/2016  . IR REPLACE G-TUBE SIMPLE WO FLUORO  10/24/2016  . IR REPLACE G-TUBE SIMPLE WO FLUORO  01/07/2017  . PEG PLACEMENT N/A 02/02/2015   Procedure: PERCUTANEOUS ENDOSCOPIC GASTROSTOMY (PEG) PLACEMENT;  Surgeon: Judeth Horn, MD;  Location: [place];  Service: General;  Laterality: N/A;  . RADIOLOGY WITH ANESTHESIA N/A 01/13/2015   Procedure: RADIOLOGY WITH ANESTHESIA;  Surgeon: Consuella Lose, MD;  Location: [facility];  Service: Radiology;  Laterality: N/A;  . TRACHEOSTOMY      Social History   Socioeconomic History  . Marital status: Single    Spouse name: Not on file  . Number of children: Not on file  . Years of education: Not on file  . Highest education level: Not on file  Social Needs  . Financial resource strain: Not on file  . Food insecurity - worry: Not on file  . Food insecurity - inability: Not on file  . Transportation needs - medical: Not on file  . Transportation needs - non-medical: Not on file  Occupational History  . Not on file  Tobacco Use  . Smoking status: Former Research scientist (life sciences)  . Smokeless tobacco: Never Used  Substance and Sexual Activity  . Alcohol use: No  . Drug use: No  . Sexual activity: Not on file  Other Topics Concern  . Not on file  Social History Narrative  . Not on file   Family  History  Problem Relation Age of Onset  . Hypertension Other       VITAL SIGNS BP 118/80   Pulse 80   Temp 98.7 F (37.1 C)   Ht 5' (1.524 m)   Wt 130 lb (59 kg)   LMP  (LMP Unknown)   SpO2 98%   BMI 25.39 kg/m   Outpatient Encounter Medications as of 03/28/2017  Medication Sig  . acetaminophen (TYLENOL) 325 MG tablet Place 650 mg into feeding tube daily. And every 4 hours as needed for mild pain,fever  . Amino Acids-Protein Hydrolys (FEEDING SUPPLEMENT, PRO-STAT SUGAR FREE 64,) LIQD Place 60 mLs into feeding tube 3 (three) times daily with meals.  . bethanechol (URECHOLINE)  10 MG tablet 10 mg by PEG Tube route every 8 (eight) hours.   . enoxaparin (LOVENOX) 60 MG/0.6ML injection Inject 60 mg into the skin 2 (two) times daily.   Marland Kitchen gabapentin (NEURONTIN) 100 MG capsule Take 100 mg by mouth 2 (two) times daily. Hold if Lathargic  . levETIRAcetam (KEPPRA) 100 MG/ML solution Place 5 mLs (500 mg total) into feeding tube 2 (two) times daily.  Marland Kitchen loperamide (IMODIUM A-D) 2 MG tablet Take 1 tablet (2 mg total) by mouth 4 (four) times daily as needed for diarrhea or loose stools.  . Multiple Vitamins-Minerals (DECUBI-VITE) CAPS Place 1 capsule into feeding tube daily.   . Nutritional Supplements (FEEDING SUPPLEMENT, JEVITY 1.5 CAL/FIBER,) LIQD Place 1,000 mLs into feeding tube continuous.  . ondansetron (ZOFRAN) 4 MG tablet Take 4 mg by mouth daily.  Marland Kitchen oxyCODONE (ROXICODONE) 5 MG immediate release tablet Place 1 tablet (5 mg total) into feeding tube every 4 (four) hours as needed for severe pain.  . Pantoprazole Sodium POWD Give 1 packet (40 mg) via G-Tube one time a day for GERD  . polyethylene glycol (MIRALAX / GLYCOLAX) packet Place 17 g into feeding tube daily.  . Probiotic Product (PROBIOTIC DAILY) CAPS Give 1 capsule by mouth two times a day for gingivitis for 90 days.  Marland Kitchen scopolamine (TRANSDERM-SCOP) 1 MG/3DAYS Place 1 patch onto the skin every 3 (three) days.  Marland Kitchen tetracycline (ACHROMYCIN,SUMYCIN) 250 MG capsule Take 250 mg by mouth 2 (two) times daily. Related to chronic gingivitis, plaque induced for 60 days.  . traZODone (DESYREL) 50 MG tablet Take 1 tablet (50 mg total) by mouth at bedtime.  . WATER FOR INJECTION STERILE IJ Place 30-60 mLs into feeding tube. Flush with 30-60 ml water before and after meds, before initiating feedings or when there is an interruption of feeding to maintain patency.  Also flush every shift with 5-10 ml water between each medication  . Water For Irrigation, Sterile (FREE WATER) SOLN Place 200 mLs into feeding tube 4 (four) times daily.   No  facility-administered encounter medications on file as of 03/28/2017.      SIGNIFICANT DIAGNOSTIC EXAMS   PREVIOUS  02-17-15: ct angio of chest: 1. Extensive pulmonary emboli involving distal main pulmonary arteries extending into lobar, segmental and subsegmental sized branches throughout the lungs bilaterally. At this time, there are no overt findings to suggest right heart strain. 2. Cardiomegaly with left ventricular concentric hypertrophy. 3. Dependent atelectasis throughout the lower lobes of the lungs bilaterally.  02-18-15: bilateral lower extremity doppler: Findings consistent with acute deep vein thrombosis involving the left common femoral vein, left proximal profunda femoris vein, and left proximal femoral vein. Incidental findings are consistent with: enlarged lymph node on the right. - No evidence of deep vein thrombosis involving  the right lower extremity. - No evidence of Baker&'s cyst on the right or left.  02-19-15: pelvic ultrasound: 1. Uterus not identified, presumed hysterectomy. No mass or free fluid seen within the midline pelvis. 2. Neither ovary is seen, perhaps bilateral oophorectomies, perhaps obscured by the fairly prominent fluid-filled bowel loops in the pelvis. No mass or free fluid seen within either adnexal region.  06-14-16: diagnostic right mammogram: benign cyst  08-02-16: left upper extremity doppler: negative for dvt  08-02-16: left hand x-ray: mild osteoarthritis   08-15-16: left hand x-ray: mild osteoarthritis no change   09-04-16: chest x-ray:  cardio retrocardiac opacities  12-31-16: right lower extremity doppler: no thrombus identified; unfortunately obscured exam. Right superficial femoral vein cannot be identified due to patient positioning. The profunda femoral and popliteal were patient. Right posterior tibial was obscured.   02-14-17: chest x-ray: no acute cardiopulmonary process    02-16-17: chest x-ray: No focal consolidation. Probable atelectatic  changes of the right upper lobe.   02-22-17: ct of abdomen and pelvis Bladder wall thickening with hazy irregular perivesicular margins compatible with cystitis; recommend correlation with urinalysis. Minimal sigmoid diverticulosis. RIGHT lower lobe infiltrate question pneumonia or aspiration. No definite acute intra-abdominal or intrapelvic abnormalities otherwise seen.  Aortic Atherosclerosis  NO NEW EXAMS     LABS REVIEWED: PREVIOUS   04-26-16: wbc 6.9; hgb 12.;6 hct 38.0; mcv 94.4; plt 231; glucose 94; bun 10.0; creat 0.65; k+ 4.2; na++ 142; liver normal albumin 4.2; chol 177; lld 124; trig 101; hdl 33  06-14-16: wbc 8.4; hgb 11.9; hct 36.6; mcv 95.4; plt 203; glucose 134; bun 30.7; creat 0.62; k+ 4.5 ;na++ 149; liver normal albumin 4.2; vit B 12: 521; folate 19.1; chol 155; ldl 85; trig 181; hdl 34; hgb a1c 5.7  8-18: hgb a1c 5.4 09-24-16: urine micro-albumin <1.2 10-11-16: glucose 133; bun 25.6; creat 0.63; k+ 4.8; na++ 140; ca 9.9; liver normal albumin 4.3 10-22-16: glucose 131; bun 29.0; creat 0.62; k+ 4.7; na++ 141; ca 9.8; liver normal albumin 4.3 01-06-17: wb 8.1; hgb 15.0; hct 45.0; mcv  93.2; plt 199; glucose 99; bun 20; creat 0.74; k+ 4.0; na++ 142; ca 10.7  01-25-17: hgb a1c 6.1; chol 187; ldl 117; trig 184; hdl 31  02-14-17: wbc 28.0; hgb 13.9; hct 41.0; mcv 90.5 ;plt 297  glucose 165; bun 34.4; creat 1.04; k+ 4.1; NA++ 156; ALT 38 AST 22; alk phos  133; albumin 4.2 blood culture: no growth urine culture no growth 02-14-17 (ED):  Wbc 33.7; hgb 13.4; hct 42.8; mcv 99.5; plt 279 glucose 210; bun 41; creat 1.37; k+ 3.6; na++ 155; ca 9.9; liver normal albumin 3.4  02-15-17: blood culture: no growth; flu: neg HIV: nr; hgb a1c 6.1 02-17-17: wbc 18.4; hgb 9.4; hct 31.6; mcv 97.8; plt 214; glucose 100; bun 23; creat 0.75; k+ 3.2; na++150 liver normal albumin 2.4 mag 2.3 phos 3.0  02-22-17: wbc 13.3; hgb 10.5; hct 33.6; mcv 94.6; plt 424; glucose 102; bun 16; creat 0.58; k+ 4.0; na++ 138; ca  9.9 02-24-17: wbc  11.2; hgb 11.2; hct 35.2; mcv 95.7; plt 387; glucose 115; bun 11; creat 0.57; k+ 4.2; na++ 138; ca 9.4  NO NEW LABS     Review of Systems  Unable to perform ROS: Other (expressive aphasia )    Physical Exam  Constitutional: She appears well-developed and well-nourished. No distress.  HENT:  Does grind teeth. TMJ pronounced to palpation She does not open mouth wide for examination Has copious amount of  oral secretions present.  Has gingivitis    Neck: Carotid bruit is present. No thyromegaly present.  Bilateral   Cardiovascular: Normal rate, regular rhythm, normal heart sounds and intact distal pulses.  Pulmonary/Chest: Effort normal and breath sounds normal. No respiratory distress.  Abdominal: Soft. Bowel sounds are normal. She exhibits no distension. There is no tenderness.  Peg tube present without signs of infection present   Musculoskeletal: She exhibits no edema.  Left hemiparesis   Lymphadenopathy:    She has no cervical adenopathy.  Neurological: She is alert.  Skin: Skin is warm and dry. She is not diaphoretic.  Unstaged right heel: 4.4 x 6.6 NM cm    Psychiatric: She has a normal mood and affect.     ASSESSMENT/ PLAN:  TODAY  1. Urine retention: stable will continue urecholine 10 mg three times daily  ; will not make changes will not make changes.   2. Type 2 diabetes mellitus with neurological manifestations: is stable  hgb a1c is 5.4 (previous 5.7) ; is currently off medications; will continue to monitor her status.  ldl is 85   3. Seizures: is stable  no reports of seizure activity present: will continue keppra 500 mg twice daily and will monitor   4. DVT (chronic left lower extremity) and chronic pulmonary embolism: is stable  she does require long term po anticoagulation therapy; will continue lovenox 60  mg twice daily (weigt based); is not a candidate for xarelto; or eliquis due to her history of GI bleed; she is not appropriate for  coumadin therapy; unable  to obtain adequate INR.  5. Bruxism and gingivitis acute plaque induced: without change will continue tetracycline 250 mg twice daily for total of 60 days; in 30 days is due to have teeth pulled by facility dentist.  6. Hypersecretion of saliva: without change will stop the scopolamine patch; will begin robinul 1 mg twice daily and will monitor her status.   PREVIOUS  7. Essential benign hypertension: stable b/p118/80   will continue lopressor 125 mg twice daily and clonidine 0.1 mg twice daily norvasc 5 mg daily   8.  Dysphagia paraesophageal  phase : stable  no signs of aspiration present  her po intake is not adequate to maintain her body weight; she is dependent upon peg tube feeding.  9. 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      MD is aware of resident's narcotic use and is in agreement with current plan of care. We will attempt to wean resident as apropriate   Ok Edwards NP [facility]  Contact [phone] Monday through Friday 8am- 5pm  After hours call [phone]

## 2017-04-22 ENCOUNTER — Other Ambulatory Visit: Payer: Self-pay

## 2017-04-22 MED ORDER — ENOXAPARIN SODIUM 60 MG/0.6ML ~~LOC~~ SOLN: 60.0000 mg | Freq: Two times a day (BID) | SUBCUTANEOUS | 0 refills | Status: DC

## 2017-04-22 NOTE — Telephone Encounter (Signed)
RX faxed to [facility] @ [phone], phone number [phone] 

## 2017-04-25 ENCOUNTER — Non-Acute Institutional Stay (SKILLED_NURSING_FACILITY): Payer: Medicaid Other | Admitting: Adult Health

## 2017-04-25 ENCOUNTER — Encounter: Payer: Self-pay | Admitting: Adult Health

## 2017-04-25 DIAGNOSIS — E785 Hyperlipidemia, unspecified: Secondary | ICD-10-CM | POA: Diagnosis not present

## 2017-04-25 DIAGNOSIS — F458 Other somatoform disorders: Secondary | ICD-10-CM | POA: Diagnosis not present

## 2017-04-25 DIAGNOSIS — K117 Disturbances of salivary secretion: Secondary | ICD-10-CM

## 2017-04-25 DIAGNOSIS — I2782 Chronic pulmonary embolism: Secondary | ICD-10-CM

## 2017-04-25 DIAGNOSIS — R1314 Dysphagia, pharyngoesophageal phase: Secondary | ICD-10-CM

## 2017-04-25 DIAGNOSIS — I69351 Hemiplegia and hemiparesis following cerebral infarction affecting right dominant side: Secondary | ICD-10-CM

## 2017-04-25 DIAGNOSIS — K219 Gastro-esophageal reflux disease without esophagitis: Secondary | ICD-10-CM | POA: Diagnosis not present

## 2017-04-25 DIAGNOSIS — I825Y2 Chronic embolism and thrombosis of unspecified deep veins of left proximal lower extremity: Secondary | ICD-10-CM | POA: Diagnosis not present

## 2017-04-25 DIAGNOSIS — E1169 Type 2 diabetes mellitus with other specified complication: Secondary | ICD-10-CM

## 2017-04-25 NOTE — Progress Notes (Signed)
Provider:  Ok Edwards, NP Location:  [facility] Room Number: 208 A Place of Service:  SNF (31)   PCP: Patient, No Pcp Per Patient Care Team: Patient, Marijo Conception, NP as Nurse Practitioner ([facility], [place] ([place])  Extended Emergency Contact Information Primary Emergency Contact: [place] Phone: [phone] Relation: Son Secondary Emergency Contact: Brien Mates [place] Mobile Phone: [phone] Relation: Son  Code Status: Full Code (Most form updated 08/21/16) Goals of Care: Advanced Directive information Advanced Directives 04/25/2017  Does Patient Have a Medical Advance Directive? Yes  Type of Advance Directive Out of facility DNR (pink MOST or yellow form)  Does patient want to make changes to medical advance directive? No - Patient declined  Copy of [place] in Chart? -  Would patient like information on creating a medical advance directive? No - Patient declined  Pre-existing out of facility DNR order (yellow form or pink MOST form) Pink MOST form placed in chart (order not valid for inpatient use)      No Known Allergies   Chief Complaint  Patient presents with  . Annual Exam        HPI: Patient is a 52 y.o. female seen today for an annual comprehensive examination. She has been hospitalized in Jan of this year for pneumonia. She has been doing well since that time. She is continuing to grind her teeth and is awaiting her dental appointment for her teeth removal. She is unable to participate in the hpi or ros. There are no reports of fevers; no reports of uncontrolled pain; no reports of behavioral issues. She continues to rely on tube feeding for her nutritional support. She will continue to be followed for her chronic illnesses: including: seizures; cva; dysphagia. There are no nursing concerns at  this time.   Past Medical History:  Diagnosis Date  . Acute pulmonary embolism ([place]) 02/19/2015  . Acute respiratory failure ([place])   . Diabetes mellitus without complication (HCC)    Type 2, W/o complications  . DVT (deep venous thrombosis) ([place]) 04/05/2015  . Dysphagia   . Epilepsy ([place])   . GERD (gastroesophageal reflux disease)   . Hyperlipidemia   . Hypertension   . IBS (irritable bowel syndrome)   . Nontraumatic subarachnoid hemorrhage ([place])   . SAH (subarachnoid hemorrhage) ([facility])   . Urinary retention    Past Surgical History:  Procedure Laterality Date  . ABDOMINAL SURGERY    . ANEURYSM COILING    . COLONOSCOPY N/A 02/20/2015   Procedure: COLONOSCOPY;  Surgeon: Gatha Mayer, MD;  Location: [facility];  Service: Endoscopy;  Laterality: N/A;  . ESOPHAGOGASTRODUODENOSCOPY (EGD) WITH PROPOFOL N/A 02/02/2015   Procedure: ESOPHAGOGASTRODUODENOSCOPY (EGD) WITH PROPOFOL;  Surgeon: Judeth Horn, MD;  Location: [facility];  Service: General;  Laterality: N/A;  . IR GENERIC HISTORICAL  08/26/2015   IR GASTRIC TUBE PERC CHG W/O IMG GUIDE 08/26/2015 Aletta Edouard, MD [facility]  . IR GENERIC HISTORICAL  09/14/2015   IR REPLC GASTRO/COLONIC TUBE PERCUT W/FLUORO 09/14/2015 Darrell K Allred, PA-C [facility]  . IR REPLACE G-TUBE SIMPLE WO FLUORO  06/08/2016  . IR REPLACE G-TUBE SIMPLE WO FLUORO  10/24/2016  . IR REPLACE G-TUBE SIMPLE WO FLUORO  01/07/2017  . PEG PLACEMENT N/A 02/02/2015   Procedure: PERCUTANEOUS ENDOSCOPIC GASTROSTOMY (PEG) PLACEMENT;  Surgeon: Judeth Horn, MD;  Location: [facility];  Service: General;  Laterality: N/A;  .  RADIOLOGY WITH ANESTHESIA N/A 01/13/2015   Procedure: RADIOLOGY WITH ANESTHESIA;  Surgeon: Consuella Lose, MD;  Location: [facility];  Service: Radiology;  Laterality: N/A;  . TRACHEOSTOMY      reports that she has quit smoking. She has never used smokeless tobacco. She reports that she does not drink alcohol or use drugs. Social History    Socioeconomic History  . Marital status: Single    Spouse name: Not on file  . Number of children: Not on file  . Years of education: Not on file  . Highest education level: Not on file  Occupational History  . Not on file  Social Needs  . Financial resource strain: Not on file  . Food insecurity:    Worry: Not on file    Inability: Not on file  . Transportation needs:    Medical: Not on file    Non-medical: Not on file  Tobacco Use  . Smoking status: Former Research scientist (life sciences)  . Smokeless tobacco: Never Used  Substance and Sexual Activity  . Alcohol use: No  . Drug use: No  . Sexual activity: Not on file  Lifestyle  . Physical activity:    Days per week: Not on file    Minutes per session: Not on file  . Stress: Not on file  Relationships  . Social connections:    Talks on phone: Not on file    Gets together: Not on file    Attends religious service: Not on file    Active member of club or organization: Not on file    Attends meetings of clubs or organizations: Not on file    Relationship status: Not on file  . Intimate partner violence:    Fear of current or ex partner: Not on file    Emotionally abused: Not on file    Physically abused: Not on file    Forced sexual activity: Not on file  Other Topics Concern  . Not on file  Social History Narrative  . Not on file   Family History  Problem Relation Age of Onset  . Hypertension Other     Vitals:   04/25/17 1051  BP: 122/74  Pulse: 77  Resp: 16  Temp: 98 F (36.7 C)  SpO2: 98%  Weight: 125 lb (56.7 kg)  Height: 5' (1.524 m)   Body mass index is 24.41 kg/m.  Allergies as of 04/25/2017   No Known Allergies     Medication List        Accurate as of 04/25/17 11:12 AM. Always use your most recent med list.          acetaminophen 325 MG tablet Commonly known as:  TYLENOL Place 650 mg into feeding tube daily. And every 4 hours as needed for mild pain,fever   bethanechol 10 MG tablet Commonly known as:   URECHOLINE 10 mg by PEG Tube route every 8 (eight) hours.   DECUBI-VITE Caps Place 1 capsule into feeding tube daily.   enoxaparin 60 MG/0.6ML injection Commonly known as:  LOVENOX Inject 0.6 mLs (60 mg total) into the skin 2 (two) times daily.   feeding supplement (JEVITY 1.5 CAL/FIBER) Liqd Place 1,000 mLs into feeding tube continuous.   feeding supplement (PRO-STAT SUGAR FREE 64) Liqd Place 60 mLs into feeding tube 3 (three) times daily with meals.   free water Soln Place 200 mLs into feeding tube 4 (four) times daily.   gabapentin 100 MG capsule Commonly known as:  NEURONTIN Take 100 mg by  mouth 2 (two) times daily. Hold if Lathargic   glycopyrrolate 1 MG tablet Commonly known as:  ROBINUL Place 1 mg into feeding tube 2 (two) times daily.   levETIRAcetam 100 MG/ML solution Commonly known as:  KEPPRA Place 5 mLs (500 mg total) into feeding tube 2 (two) times daily.   loperamide 2 MG tablet Commonly known as:  IMODIUM A-D Take 1 tablet (2 mg total) by mouth 4 (four) times daily as needed for diarrhea or loose stools.   NEOMYCIN-POLYMYXIN-BACITRACIN EX Apply topically daily to right posterior heel and cover with dry dressing   ondansetron 4 MG tablet Commonly known as:  ZOFRAN Take 4 mg by mouth daily.   oxyCODONE 5 MG immediate release tablet Commonly known as:  ROXICODONE Place 1 tablet (5 mg total) into feeding tube every 4 (four) hours as needed for severe pain.   pantoprazole sodium 40 mg/20 mL Pack Commonly known as:  PROTONIX Give 68m (451m enterally daily   polyethylene glycol packet Commonly known as:  MIRALAX / GLYCOLAX Place 17 g into feeding tube daily.   PROBIOTIC DAILY Caps Place into feeding tube. Give 1 capsule Via G-tube Two times a day for gingivitis for 90 days.   scopolamine 1 MG/3DAYS Commonly known as:  TRANSDERM-SCOP Place 1 patch onto the skin every 3 (three) days.   tetracycline 250 MG capsule Commonly known as:   ACHROMYCIN,SUMYCIN Place 250 mg into feeding tube 2 (two) times daily. Related to chronic gingivitis, plaque induced for 60 days.   traZODone 50 MG tablet Commonly known as:  DESYREL Take 1 tablet (50 mg total) by mouth at bedtime.   WATER FOR INJECTION STERILE IJ Place 30-60 mLs into feeding tube. Flush with 30-60 ml water before and after meds, before initiating feedings or when there is an interruption of feeding to maintain patency.  Also flush every shift with 5-10 ml water between each medication        SIGNIFICANT DIAGNOSTIC EXAMS  PREVIOUS  02-17-15: ct angio of chest: 1. Extensive pulmonary emboli involving distal main pulmonary arteries extending into lobar, segmental and subsegmental sized branches throughout the lungs bilaterally. At this time, there are no overt findings to suggest right heart strain. 2. Cardiomegaly with left ventricular concentric hypertrophy. 3. Dependent atelectasis throughout the lower lobes of the lungs bilaterally.  02-18-15: bilateral lower extremity doppler: Findings consistent with acute deep vein thrombosis involving the left common femoral vein, left proximal profunda femoris vein, and left proximal femoral vein. Incidental findings are consistent with: enlarged lymph node on the right. - No evidence of deep vein thrombosis involving the right lower extremity. - No evidence of Baker&'s cyst on the right or left.  02-19-15: pelvic ultrasound: 1. Uterus not identified, presumed hysterectomy. No mass or free fluid seen within the midline pelvis. 2. Neither ovary is seen, perhaps bilateral oophorectomies, perhaps obscured by the fairly prominent fluid-filled bowel loops in the pelvis. No mass or free fluid seen within either adnexal region.  06-14-16: diagnostic right mammogram: benign cyst  08-02-16: left upper extremity doppler: negative for dvt  08-02-16: left hand x-ray: mild osteoarthritis   08-15-16: left hand x-ray: mild osteoarthritis no change     09-04-16: chest x-ray:  cardio retrocardiac opacities  12-31-16: right lower extremity doppler: no thrombus identified; unfortunately obscured exam. Right superficial femoral vein cannot be identified due to patient positioning. The profunda femoral and popliteal were patient. Right posterior tibial was obscured.   02-14-17: chest x-ray: no acute cardiopulmonary process  02-16-17: chest x-ray: No focal consolidation. Probable atelectatic changes of the right upper lobe.   02-22-17: ct of abdomen and pelvis Bladder wall thickening with hazy irregular perivesicular margins compatible with cystitis; recommend correlation with urinalysis. Minimal sigmoid diverticulosis. RIGHT lower lobe infiltrate question pneumonia or aspiration. No definite acute intra-abdominal or intrapelvic abnormalities otherwise seen.  Aortic Atherosclerosis  NO NEW EXAMS     LABS REVIEWED: PREVIOUS   04-26-16: wbc 6.9; hgb 12.;6 hct 38.0; mcv 94.4; plt 231; glucose 94; bun 10.0; creat 0.65; k+ 4.2; na++ 142; liver normal albumin 4.2; chol 177; lld 124; trig 101; hdl 33  06-14-16: wbc 8.4; hgb 11.9; hct 36.6; mcv 95.4; plt 203; glucose 134; bun 30.7; creat 0.62; k+ 4.5 ;na++ 149; liver normal albumin 4.2; vit B 12: 521; folate 19.1; chol 155; ldl 85; trig 181; hdl 34; hgb a1c 5.7  8-18: hgb a1c 5.4 09-24-16: urine micro-albumin <1.2 10-11-16: glucose 133; bun 25.6; creat 0.63; k+ 4.8; na++ 140; ca 9.9; liver normal albumin 4.3 10-22-16: glucose 131; bun 29.0; creat 0.62; k+ 4.7; na++ 141; ca 9.8; liver normal albumin 4.3 01-06-17: wb 8.1; hgb 15.0; hct 45.0; mcv  93.2; plt 199; glucose 99; bun 20; creat 0.74; k+ 4.0; na++ 142; ca 10.7  01-25-17: hgb a1c 6.1; chol 187; ldl 117; trig 184; hdl 31  02-14-17: wbc 28.0; hgb 13.9; hct 41.0; mcv 90.5 ;plt 297  glucose 165; bun 34.4; creat 1.04; k+ 4.1; NA++ 156; ALT 38 AST 22; alk phos  133; albumin 4.2 blood culture: no growth urine culture no growth 02-14-17 (ED):  Wbc 33.7; hgb  13.4; hct 42.8; mcv 99.5; plt 279 glucose 210; bun 41; creat 1.37; k+ 3.6; na++ 155; ca 9.9; liver normal albumin 3.4  02-15-17: blood culture: no growth; flu: neg HIV: nr; hgb a1c 6.1 02-17-17: wbc 18.4; hgb 9.4; hct 31.6; mcv 97.8; plt 214; glucose 100; bun 23; creat 0.75; k+ 3.2; na++150 liver normal albumin 2.4 mag 2.3 phos 3.0  02-22-17: wbc 13.3; hgb 10.5; hct 33.6; mcv 94.6; plt 424; glucose 102; bun 16; creat 0.58; k+ 4.0; na++ 138; ca 9.9 02-24-17: wbc  11.2; hgb 11.2; hct 35.2; mcv 95.7; plt 387; glucose 115; bun 11; creat 0.57; k+ 4.2; na++ 138; ca 9.4  NO NEW LABS    Review of Systems  Unable to perform ROS: Other (expressive aphasia )    Physical Exam  Constitutional: She appears well-developed and well-nourished. No distress.  HENT:  Her saliva is less   Eyes: Conjunctivae are normal.  Neck: Carotid bruit is present. No thyromegaly present.  Bilateral   Cardiovascular: Normal rate, regular rhythm, normal heart sounds and intact distal pulses.  Pulmonary/Chest: Effort normal and breath sounds normal.  Abdominal: Soft. Bowel sounds are normal. She exhibits no distension. There is no tenderness.  Peg tube present without signs of infection present   Musculoskeletal: She exhibits no edema.  Left hemiparesis   Lymphadenopathy:    She has no cervical adenopathy.  Neurological: She is alert.  Skin: Skin is warm and dry. She is not diaphoretic.  Psychiatric: She has a normal mood and affect.      Physical Exam  Constitutional: She appears well-developed and well-nourished. No distress.  HENT:  Does grind teeth. TMJ pronounced to palpation She does not open mouth wide for examination Has copious amount of oral secretions present.  Has gingivitis    Neck: Carotid bruit is present. No thyromegaly present.  Bilateral   Cardiovascular: Normal rate,  regular rhythm, normal heart sounds and intact distal pulses.  Pulmonary/Chest: Effort normal and breath sounds normal. No  respiratory distress.  Abdominal: Soft. Bowel sounds are normal. She exhibits no distension. There is no tenderness.  Peg tube present without signs of infection present   Musculoskeletal: She exhibits no edema.  Left hemiparesis   Lymphadenopathy:    She has no cervical adenopathy.  Neurological: She is alert.  Skin: Skin is warm and dry. She is not diaphoretic.  Unstaged right heel: 4.4 x 6.6 NM cm    Psychiatric: She has a normal mood and affect.     ASSESSMENT/ PLAN:  TODAY  1. Urine retention: stable will continue urecholine 10 mg three times daily  ; will not make changes will not make changes.   2. Type 2 diabetes mellitus with neurological manifestations: is stable  hgb a1c is 5.4 (previous 5.7) ; is currently off medications; will continue to monitor her status.  ldl is 85   3. Seizures: is stable  no reports of seizure activity present: will continue keppra 500 mg twice daily and will monitor   4. 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  5. Bruxism and gingivitis acute plaque induced: without change will continue tetracycline 250 mg twice daily for total of 60 days; in 30 days is due to have teeth pulled by facility dentist.  6. Hypersecretion of saliva: stable will continue robinul 1 mg twice daily and will monitor her status.   7. Essential benign hypertension: stable b/p122/74   will continue lopressor 125 mg twice daily and clonidine 0.1 mg twice daily norvasc 5 mg daily   8.  Dysphagia paraesophageal  phase : stable  no signs of aspiration present  her po intake is not adequate to maintain her body weight; she is dependent upon peg tube feeding.  9. 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    10. Dyslipidemia associated with type 2 diabetes mellitus: without change will being crestor 20 mg daily will monitor   11. gerd without esophagitis: stable will stop protonix and will being zantac 150 mg daily   Will setup screening mammogram Female wellness exam Will check hepatitis C In 2 months will check lipids and liver function    Time spent with patient and staff: 40 minutes: reviewed her medical record; her health maintenance needs.    MD is aware of resident's narcotic use and is in agreement with current plan of care. We will wean dosage as appropriate for resident    Ok Edwards NP [facility]  Contact [phone] Monday through Friday 8am- 5pm  After hours call [phone]

## 2017-04-26 ENCOUNTER — Non-Acute Institutional Stay (SKILLED_NURSING_FACILITY): Payer: Medicaid Other | Admitting: Adult Health

## 2017-04-26 ENCOUNTER — Encounter: Payer: Self-pay | Admitting: Adult Health

## 2017-04-26 DIAGNOSIS — M7989 Other specified soft tissue disorders: Secondary | ICD-10-CM

## 2017-04-26 DIAGNOSIS — L89612 Pressure ulcer of right heel, stage 2: Secondary | ICD-10-CM | POA: Diagnosis not present

## 2017-04-26 NOTE — Progress Notes (Signed)
Location:   [facility] Room Number: 208 A Place of Service:  SNF (31)   CODE STATUS: Full Code (Most form updated 08/21/16)  No Known Allergies  Chief Complaint  Patient presents with  . Acute Visit    Right leg edema    HPI:  She has a right posterior heel stage II 0.5 x 1.5 cm her right foot is warm to touch. There is swelling up her right leg and it is painful to touch. She is on long term lovenox for chronic dvt. There are no signs of infection present. She is on tetracycline for her gum infection. There are no reports of fevers present.    Past Medical History:  Diagnosis Date  . Acute pulmonary embolism ([place]) 02/19/2015  . Acute respiratory failure ([place])   . Diabetes mellitus without complication (HCC)    Type 2, W/o complications  . DVT (deep venous thrombosis) ([place]) 04/05/2015  . Dysphagia   . Epilepsy ([place])   . GERD (gastroesophageal reflux disease)   . Hyperlipidemia   . Hypertension   . IBS (irritable bowel syndrome)   . Nontraumatic subarachnoid hemorrhage ([place])   . SAH (subarachnoid hemorrhage) ([place])   . Urinary retention     Past Surgical History:  Procedure Laterality Date  . ABDOMINAL SURGERY    . ANEURYSM COILING    . COLONOSCOPY N/A 02/20/2015   Procedure: COLONOSCOPY;  Surgeon: Gatha Mayer, MD;  Location: [facility];  Service: Endoscopy;  Laterality: N/A;  . ESOPHAGOGASTRODUODENOSCOPY (EGD) WITH PROPOFOL N/A 02/02/2015   Procedure: ESOPHAGOGASTRODUODENOSCOPY (EGD) WITH PROPOFOL;  Surgeon: Judeth Horn, MD;  Location: [facility];  Service: General;  Laterality: N/A;  . IR GENERIC HISTORICAL  08/26/2015   IR GASTRIC TUBE PERC CHG W/O IMG GUIDE 08/26/2015 Aletta Edouard, MD [facility]  . IR GENERIC HISTORICAL  09/14/2015   IR REPLC GASTRO/COLONIC TUBE PERCUT W/FLUORO 09/14/2015 Darrell K Allred, PA-C [facility]  . IR REPLACE G-TUBE SIMPLE WO FLUORO  06/08/2016  . IR REPLACE G-TUBE SIMPLE WO FLUORO  10/24/2016  . IR REPLACE G-TUBE  SIMPLE WO FLUORO  01/07/2017  . PEG PLACEMENT N/A 02/02/2015   Procedure: PERCUTANEOUS ENDOSCOPIC GASTROSTOMY (PEG) PLACEMENT;  Surgeon: Judeth Horn, MD;  Location: [facility];  Service: General;  Laterality: N/A;  . RADIOLOGY WITH ANESTHESIA N/A 01/13/2015   Procedure: RADIOLOGY WITH ANESTHESIA;  Surgeon: Consuella Lose, MD;  Location: [facility];  Service: Radiology;  Laterality: N/A;  . TRACHEOSTOMY      Social History   Socioeconomic History  . Marital status: Single    Spouse name: Not on file  . Number of children: Not on file  . Years of education: Not on file  . Highest education level: Not on file  Occupational History  . Not on file  Social Needs  . Financial resource strain: Not on file  . Food insecurity:    Worry: Not on file    Inability: Not on file  . Transportation needs:    Medical: Not on file    Non-medical: Not on file  Tobacco Use  . Smoking status: Former Research scientist (life sciences)  . Smokeless tobacco: Never Used  Substance and Sexual Activity  . Alcohol use: No  . Drug use: No  . Sexual activity: Not on file  Lifestyle  . Physical activity:    Days per week: Not on file    Minutes per session: Not on file  . Stress: Not on file  Relationships  . Social connections:  Talks on phone: Not on file    Gets together: Not on file    Attends religious service: Not on file    Active member of club or organization: Not on file    Attends meetings of clubs or organizations: Not on file    Relationship status: Not on file  . Intimate partner violence:    Fear of current or ex partner: Not on file    Emotionally abused: Not on file    Physically abused: Not on file    Forced sexual activity: Not on file  Other Topics Concern  . Not on file  Social History Narrative  . Not on file   Family History  Problem Relation Age of Onset  . Hypertension Other       VITAL SIGNS BP 122/74   Pulse 77   Temp 98 F (36.7 C)   Resp 16   Ht 5' (1.524 m)   Wt 125 lb (56.7  kg)   LMP  (LMP Unknown)   SpO2 98%   BMI 24.41 kg/m   Outpatient Encounter Medications as of 04/26/2017  Medication Sig  . acetaminophen (TYLENOL) 325 MG tablet Place 650 mg into feeding tube daily. And every 4 hours as needed for mild pain,fever  . bethanechol (URECHOLINE) 10 MG tablet 10 mg by PEG Tube route every 8 (eight) hours.   . enoxaparin (LOVENOX) 60 MG/0.6ML injection Inject 0.6 mLs (60 mg total) into the skin 2 (two) times daily.  Marland Kitchen gabapentin (NEURONTIN) 100 MG capsule Take 200 mg by mouth 2 (two) times daily. Hold if Lathargic  . glycopyrrolate (ROBINUL) 1 MG tablet Place 1 mg into feeding tube 2 (two) times daily.  Marland Kitchen levETIRAcetam (KEPPRA) 100 MG/ML solution Place 5 mLs (500 mg total) into feeding tube 2 (two) times daily.  Marland Kitchen loperamide (IMODIUM A-D) 2 MG tablet Take 1 tablet (2 mg total) by mouth 4 (four) times daily as needed for diarrhea or loose stools.  . Multiple Vitamins-Minerals (DECUBI-VITE) CAPS Place 1 capsule into feeding tube daily.   Marland Kitchen Neomycin-Bacitracin-Polymyxin (NEOMYCIN-POLYMYXIN-BACITRACIN EX) Apply topically daily to right posterior heel and cover with dry dressing  . Nutritional Supplements (FEEDING SUPPLEMENT, JEVITY 1.5 CAL/FIBER,) LIQD Place 1,000 mLs into feeding tube continuous.  . ondansetron (ZOFRAN) 4 MG tablet Take 4 mg by mouth daily.  Marland Kitchen oxyCODONE (ROXICODONE) 5 MG immediate release tablet Place 1 tablet (5 mg total) into feeding tube every 4 (four) hours as needed for severe pain.  . Probiotic Product (PROBIOTIC DAILY) CAPS Place into feeding tube. Give 1 capsule Via G-tube Two times a day for gingivitis for 90 days.  . ranitidine (ZANTAC) 150 MG tablet Place 150 mg into feeding tube daily.  . rosuvastatin (CRESTOR) 20 MG tablet Take 20 mg by mouth at bedtime.  Marland Kitchen tetracycline (ACHROMYCIN,SUMYCIN) 250 MG capsule Place 250 mg into feeding tube 2 (two) times daily. Related to chronic gingivitis, plaque induced for 60 days.   . traZODone (DESYREL)  50 MG tablet Take 1 tablet (50 mg total) by mouth at bedtime.  . WATER FOR INJECTION STERILE IJ Place 30-60 mLs into feeding tube. Flush with 30-60 ml water before and after meds, before initiating feedings or when there is an interruption of feeding to maintain patency.  Also flush every shift with 5-10 ml water between each medication  . Water For Irrigation, Sterile (FREE WATER) SOLN Place 200 mLs into feeding tube 4 (four) times daily.  . [DISCONTINUED] polyethylene glycol (MIRALAX / GLYCOLAX) packet  Place 17 g into feeding tube daily.  . Amino Acids-Protein Hydrolys (FEEDING SUPPLEMENT, PRO-STAT SUGAR FREE 64,) LIQD Place 60 mLs into feeding tube 3 (three) times daily with meals.  . [DISCONTINUED] pantoprazole sodium (PROTONIX) 40 mg/20 mL PACK Give 65m (420m enterally daily  . [DISCONTINUED] scopolamine (TRANSDERM-SCOP) 1 MG/3DAYS Place 1 patch onto the skin every 3 (three) days.   No facility-administered encounter medications on file as of 04/26/2017.      SIGNIFICANT DIAGNOSTIC EXAMS   PREVIOUS  02-17-15: ct angio of chest: 1. Extensive pulmonary emboli involving distal main pulmonary arteries extending into lobar, segmental and subsegmental sized branches throughout the lungs bilaterally. At this time, there are no overt findings to suggest right heart strain. 2. Cardiomegaly with left ventricular concentric hypertrophy. 3. Dependent atelectasis throughout the lower lobes of the lungs bilaterally.  02-18-15: bilateral lower extremity doppler: Findings consistent with acute deep vein thrombosis involving the left common femoral vein, left proximal profunda femoris vein, and left proximal femoral vein. Incidental findings are consistent with: enlarged lymph node on the right. - No evidence of deep vein thrombosis involving the right lower extremity. - No evidence of Baker&'s cyst on the right or left.  02-19-15: pelvic ultrasound: 1. Uterus not identified, presumed hysterectomy. No  mass or free fluid seen within the midline pelvis. 2. Neither ovary is seen, perhaps bilateral oophorectomies, perhaps obscured by the fairly prominent fluid-filled bowel loops in the pelvis. No mass or free fluid seen within either adnexal region.  06-14-16: diagnostic right mammogram: benign cyst  08-02-16: left upper extremity doppler: negative for dvt  08-02-16: left hand x-ray: mild osteoarthritis   08-15-16: left hand x-ray: mild osteoarthritis no change   09-04-16: chest x-ray:  cardio retrocardiac opacities  12-31-16: right lower extremity doppler: no thrombus identified; unfortunately obscured exam. Right superficial femoral vein cannot be identified due to patient positioning. The profunda femoral and popliteal were patient. Right posterior tibial was obscured.   02-14-17: chest x-ray: no acute cardiopulmonary process    02-16-17: chest x-ray: No focal consolidation. Probable atelectatic changes of the right upper lobe.   02-22-17: ct of abdomen and pelvis Bladder wall thickening with hazy irregular perivesicular margins compatible with cystitis; recommend correlation with urinalysis. Minimal sigmoid diverticulosis. RIGHT lower lobe infiltrate question pneumonia or aspiration. No definite acute intra-abdominal or intrapelvic abnormalities otherwise seen.  Aortic Atherosclerosis  NO NEW EXAMS     LABS REVIEWED: PREVIOUS   04-26-16: wbc 6.9; hgb 12.;6 hct 38.0; mcv 94.4; plt 231; glucose 94; bun 10.0; creat 0.65; k+ 4.2; na++ 142; liver normal albumin 4.2; chol 177; lld 124; trig 101; hdl 33  06-14-16: wbc 8.4; hgb 11.9; hct 36.6; mcv 95.4; plt 203; glucose 134; bun 30.7; creat 0.62; k+ 4.5 ;na++ 149; liver normal albumin 4.2; vit B 12: 521; folate 19.1; chol 155; ldl 85; trig 181; hdl 34; hgb a1c 5.7  8-18: hgb a1c 5.4 09-24-16: urine micro-albumin <1.2 10-11-16: glucose 133; bun 25.6; creat 0.63; k+ 4.8; na++ 140; ca 9.9; liver normal albumin 4.3 10-22-16: glucose 131; bun 29.0; creat  0.62; k+ 4.7; na++ 141; ca 9.8; liver normal albumin 4.3 01-06-17: wb 8.1; hgb 15.0; hct 45.0; mcv  93.2; plt 199; glucose 99; bun 20; creat 0.74; k+ 4.0; na++ 142; ca 10.7  01-25-17: hgb a1c 6.1; chol 187; ldl 117; trig 184; hdl 31  02-14-17: wbc 28.0; hgb 13.9; hct 41.0; mcv 90.5 ;plt 297  glucose 165; bun 34.4; creat 1.04; k+ 4.1; NA++ 156; ALT  38 AST 22; alk phos  133; albumin 4.2 blood culture: no growth urine culture no growth 02-14-17 (ED):  Wbc 33.7; hgb 13.4; hct 42.8; mcv 99.5; plt 279 glucose 210; bun 41; creat 1.37; k+ 3.6; na++ 155; ca 9.9; liver normal albumin 3.4  02-15-17: blood culture: no growth; flu: neg HIV: nr; hgb a1c 6.1 02-17-17: wbc 18.4; hgb 9.4; hct 31.6; mcv 97.8; plt 214; glucose 100; bun 23; creat 0.75; k+ 3.2; na++150 liver normal albumin 2.4 mag 2.3 phos 3.0  02-22-17: wbc 13.3; hgb 10.5; hct 33.6; mcv 94.6; plt 424; glucose 102; bun 16; creat 0.58; k+ 4.0; na++ 138; ca 9.9 02-24-17: wbc  11.2; hgb 11.2; hct 35.2; mcv 95.7; plt 387; glucose 115; bun 11; creat 0.57; k+ 4.2; na++ 138; ca 9.4  NO NEW LABS    Review of Systems  Unable to perform ROS: Other (expressive aphasia )    Physical Exam  Constitutional: She appears well-developed and well-nourished. No distress.  Neck: Carotid bruit is present. No thyromegaly present.  Bilateral   Cardiovascular: Normal rate, regular rhythm, normal heart sounds and intact distal pulses.  Pulmonary/Chest: Effort normal and breath sounds normal. No respiratory distress.  Abdominal: Soft. Bowel sounds are normal. She exhibits no distension. There is no tenderness.  Peg tube present without signs of infection present   Musculoskeletal:  Left hemiplegia Right lower extremity with edema; tenderness and pain present   Lymphadenopathy:    She has no cervical adenopathy.  Neurological: She is alert.  Skin: Skin is warm and dry. She is not diaphoretic.  Stage II right posterior 0.5 x 1.5 cm without signs of infection present     Psychiatric: She has a normal mood and affect.     ASSESSMENT/ PLAN:  TODAY:  1. Stage II right heel: will continue current plan of care will monitor her status.  2. Right leg swelling: will get a venous doppler   MD is aware of resident's narcotic use and is in agreement with current plan of care. We will attempt to wean resident as apropriate   Ok Edwards NP [facility]  Contact [phone] Monday through Friday 8am- 5pm  After hours call [phone]

## 2017-04-27 DIAGNOSIS — E1169 Type 2 diabetes mellitus with other specified complication: Secondary | ICD-10-CM | POA: Insufficient documentation

## 2017-04-27 DIAGNOSIS — E785 Hyperlipidemia, unspecified: Secondary | ICD-10-CM

## 2017-04-28 ENCOUNTER — Encounter: Payer: Self-pay | Admitting: Internal Medicine

## 2017-05-06 ENCOUNTER — Encounter (HOSPITAL_COMMUNITY): Payer: Self-pay

## 2017-05-06 ENCOUNTER — Other Ambulatory Visit: Payer: Self-pay

## 2017-05-06 ENCOUNTER — Emergency Department (HOSPITAL_COMMUNITY): Payer: Medicaid Other

## 2017-05-06 ENCOUNTER — Inpatient Hospital Stay (HOSPITAL_COMMUNITY)
Admission: EM | Admit: 2017-05-06 | Discharge: 2017-05-13 | DRG: 871 | Disposition: A | Discharge status: Skilled Nursing Facility | Payer: Medicaid Other | Source: Skilled Nursing Facility | Attending: Internal Medicine | Admitting: Internal Medicine

## 2017-05-06 DIAGNOSIS — E1149 Type 2 diabetes mellitus with other diabetic neurological complication: Secondary | ICD-10-CM | POA: Diagnosis present

## 2017-05-06 DIAGNOSIS — K219 Gastro-esophageal reflux disease without esophagitis: Secondary | ICD-10-CM | POA: Diagnosis present

## 2017-05-06 DIAGNOSIS — I69351 Hemiplegia and hemiparesis following cerebral infarction affecting right dominant side: Secondary | ICD-10-CM | POA: Diagnosis not present

## 2017-05-06 DIAGNOSIS — E785 Hyperlipidemia, unspecified: Secondary | ICD-10-CM | POA: Diagnosis present

## 2017-05-06 DIAGNOSIS — M25561 Pain in right knee: Secondary | ICD-10-CM | POA: Diagnosis present

## 2017-05-06 DIAGNOSIS — R7881 Bacteremia: Secondary | ICD-10-CM

## 2017-05-06 DIAGNOSIS — K5900 Constipation, unspecified: Secondary | ICD-10-CM | POA: Diagnosis present

## 2017-05-06 DIAGNOSIS — L97409 Non-pressure chronic ulcer of unspecified heel and midfoot with unspecified severity: Secondary | ICD-10-CM | POA: Diagnosis present

## 2017-05-06 DIAGNOSIS — E861 Hypovolemia: Secondary | ICD-10-CM | POA: Diagnosis present

## 2017-05-06 DIAGNOSIS — A411 Sepsis due to other specified staphylococcus: Principal | ICD-10-CM | POA: Diagnosis present

## 2017-05-06 DIAGNOSIS — L89609 Pressure ulcer of unspecified heel, unspecified stage: Secondary | ICD-10-CM | POA: Diagnosis present

## 2017-05-06 DIAGNOSIS — L97419 Non-pressure chronic ulcer of right heel and midfoot with unspecified severity: Secondary | ICD-10-CM | POA: Diagnosis not present

## 2017-05-06 DIAGNOSIS — N179 Acute kidney failure, unspecified: Secondary | ICD-10-CM | POA: Diagnosis present

## 2017-05-06 DIAGNOSIS — K56609 Unspecified intestinal obstruction, unspecified as to partial versus complete obstruction: Secondary | ICD-10-CM

## 2017-05-06 DIAGNOSIS — I10 Essential (primary) hypertension: Secondary | ICD-10-CM | POA: Diagnosis present

## 2017-05-06 DIAGNOSIS — Z7901 Long term (current) use of anticoagulants: Secondary | ICD-10-CM

## 2017-05-06 DIAGNOSIS — Z90710 Acquired absence of both cervix and uterus: Secondary | ICD-10-CM

## 2017-05-06 DIAGNOSIS — Z79899 Other long term (current) drug therapy: Secondary | ICD-10-CM

## 2017-05-06 DIAGNOSIS — R4701 Aphasia: Secondary | ICD-10-CM | POA: Diagnosis not present

## 2017-05-06 DIAGNOSIS — R1310 Dysphagia, unspecified: Secondary | ICD-10-CM | POA: Diagnosis present

## 2017-05-06 DIAGNOSIS — I2782 Chronic pulmonary embolism: Secondary | ICD-10-CM

## 2017-05-06 DIAGNOSIS — I248 Other forms of acute ischemic heart disease: Secondary | ICD-10-CM | POA: Diagnosis present

## 2017-05-06 DIAGNOSIS — R748 Abnormal levels of other serum enzymes: Secondary | ICD-10-CM | POA: Diagnosis not present

## 2017-05-06 DIAGNOSIS — E11621 Type 2 diabetes mellitus with foot ulcer: Secondary | ICD-10-CM | POA: Diagnosis present

## 2017-05-06 DIAGNOSIS — G40909 Epilepsy, unspecified, not intractable, without status epilepticus: Secondary | ICD-10-CM | POA: Diagnosis present

## 2017-05-06 DIAGNOSIS — E876 Hypokalemia: Secondary | ICD-10-CM | POA: Diagnosis present

## 2017-05-06 DIAGNOSIS — E43 Unspecified severe protein-calorie malnutrition: Secondary | ICD-10-CM | POA: Diagnosis present

## 2017-05-06 DIAGNOSIS — Z8249 Family history of ischemic heart disease and other diseases of the circulatory system: Secondary | ICD-10-CM

## 2017-05-06 DIAGNOSIS — I69851 Hemiplegia and hemiparesis following other cerebrovascular disease affecting right dominant side: Secondary | ICD-10-CM | POA: Diagnosis not present

## 2017-05-06 DIAGNOSIS — R109 Unspecified abdominal pain: Secondary | ICD-10-CM

## 2017-05-06 DIAGNOSIS — Z993 Dependence on wheelchair: Secondary | ICD-10-CM

## 2017-05-06 DIAGNOSIS — Z86718 Personal history of other venous thrombosis and embolism: Secondary | ICD-10-CM

## 2017-05-06 DIAGNOSIS — R569 Unspecified convulsions: Secondary | ICD-10-CM

## 2017-05-06 DIAGNOSIS — G8929 Other chronic pain: Secondary | ICD-10-CM | POA: Diagnosis present

## 2017-05-06 DIAGNOSIS — I471 Supraventricular tachycardia: Secondary | ICD-10-CM | POA: Diagnosis present

## 2017-05-06 DIAGNOSIS — R471 Dysarthria and anarthria: Secondary | ICD-10-CM | POA: Diagnosis not present

## 2017-05-06 DIAGNOSIS — R7989 Other specified abnormal findings of blood chemistry: Secondary | ICD-10-CM

## 2017-05-06 DIAGNOSIS — M25569 Pain in unspecified knee: Secondary | ICD-10-CM | POA: Diagnosis not present

## 2017-05-06 DIAGNOSIS — I5030 Unspecified diastolic (congestive) heart failure: Secondary | ICD-10-CM | POA: Diagnosis not present

## 2017-05-06 DIAGNOSIS — J690 Pneumonitis due to inhalation of food and vomit: Secondary | ICD-10-CM | POA: Diagnosis present

## 2017-05-06 DIAGNOSIS — B370 Candidal stomatitis: Secondary | ICD-10-CM | POA: Diagnosis present

## 2017-05-06 DIAGNOSIS — Z931 Gastrostomy status: Secondary | ICD-10-CM | POA: Diagnosis not present

## 2017-05-06 DIAGNOSIS — A419 Sepsis, unspecified organism: Secondary | ICD-10-CM

## 2017-05-06 DIAGNOSIS — L89611 Pressure ulcer of right heel, stage 1: Secondary | ICD-10-CM | POA: Diagnosis not present

## 2017-05-06 DIAGNOSIS — E870 Hyperosmolality and hypernatremia: Secondary | ICD-10-CM | POA: Diagnosis present

## 2017-05-06 DIAGNOSIS — M62461 Contracture of muscle, right lower leg: Secondary | ICD-10-CM | POA: Diagnosis not present

## 2017-05-06 DIAGNOSIS — R6520 Severe sepsis without septic shock: Secondary | ICD-10-CM | POA: Diagnosis present

## 2017-05-06 DIAGNOSIS — R778 Other specified abnormalities of plasma proteins: Secondary | ICD-10-CM | POA: Diagnosis present

## 2017-05-06 DIAGNOSIS — I69391 Dysphagia following cerebral infarction: Secondary | ICD-10-CM

## 2017-05-06 DIAGNOSIS — M79671 Pain in right foot: Secondary | ICD-10-CM

## 2017-05-06 DIAGNOSIS — I69820 Aphasia following other cerebrovascular disease: Secondary | ICD-10-CM | POA: Diagnosis not present

## 2017-05-06 DIAGNOSIS — I69320 Aphasia following cerebral infarction: Secondary | ICD-10-CM | POA: Diagnosis not present

## 2017-05-06 DIAGNOSIS — E11622 Type 2 diabetes mellitus with other skin ulcer: Secondary | ICD-10-CM | POA: Diagnosis not present

## 2017-05-06 DIAGNOSIS — B957 Other staphylococcus as the cause of diseases classified elsewhere: Secondary | ICD-10-CM | POA: Diagnosis not present

## 2017-05-06 DIAGNOSIS — Z794 Long term (current) use of insulin: Secondary | ICD-10-CM

## 2017-05-06 DIAGNOSIS — K589 Irritable bowel syndrome without diarrhea: Secondary | ICD-10-CM | POA: Diagnosis present

## 2017-05-06 DIAGNOSIS — Z930 Tracheostomy status: Secondary | ICD-10-CM

## 2017-05-06 DIAGNOSIS — Z7401 Bed confinement status: Secondary | ICD-10-CM

## 2017-05-06 DIAGNOSIS — I313 Pericardial effusion (noninflammatory): Secondary | ICD-10-CM | POA: Diagnosis present

## 2017-05-06 DIAGNOSIS — L98491 Non-pressure chronic ulcer of skin of other sites limited to breakdown of skin: Secondary | ICD-10-CM | POA: Diagnosis not present

## 2017-05-06 DIAGNOSIS — R000 Tachycardia, unspecified: Secondary | ICD-10-CM

## 2017-05-06 LAB — CBC WITH DIFFERENTIAL/PLATELET
BASOS ABS: 0 10*3/uL (ref 0.0–0.1)
BASOS PCT: 0 %
EOS PCT: 0 %
Eosinophils Absolute: 0 10*3/uL (ref 0.0–0.7)
HCT: 47.9 % — ABNORMAL HIGH (ref 36.0–46.0)
Hemoglobin: 14.5 g/dL (ref 12.0–15.0)
Lymphocytes Relative: 9 %
Lymphs Abs: 1.5 10*3/uL (ref 0.7–4.0)
MCH: 29.3 pg (ref 26.0–34.0)
MCHC: 30.3 g/dL (ref 30.0–36.0)
MCV: 96.8 fL (ref 78.0–100.0)
MONO ABS: 1.5 10*3/uL — AB (ref 0.1–1.0)
MONOS PCT: 9 %
Neutro Abs: 13.7 10*3/uL — ABNORMAL HIGH (ref 1.7–7.7)
Neutrophils Relative %: 82 %
Platelets: 306 10*3/uL (ref 150–400)
RBC: 4.95 MIL/uL (ref 3.87–5.11)
RDW: 15.5 % (ref 11.5–15.5)
WBC: 16.7 10*3/uL — ABNORMAL HIGH (ref 4.0–10.5)

## 2017-05-06 LAB — URINALYSIS, ROUTINE W REFLEX MICROSCOPIC
BILIRUBIN URINE: NEGATIVE
Glucose, UA: 50 mg/dL — AB
KETONES UR: 5 mg/dL — AB
Leukocytes, UA: NEGATIVE
NITRITE: NEGATIVE
PH: 5 (ref 5.0–8.0)
Protein, ur: >=300 mg/dL — AB
SPECIFIC GRAVITY, URINE: 1.026 (ref 1.005–1.030)

## 2017-05-06 LAB — BASIC METABOLIC PANEL
Anion gap: 10 (ref 5–15)
BUN: 68 mg/dL — AB (ref 6–20)
CALCIUM: 9.1 mg/dL (ref 8.9–10.3)
CO2: 21 mmol/L — ABNORMAL LOW (ref 22–32)
CREATININE: 2.68 mg/dL — AB (ref 0.44–1.00)
Chloride: 123 mmol/L — ABNORMAL HIGH (ref 101–111)
GFR calc Af Amer: 22 mL/min — ABNORMAL LOW (ref >60)
GFR, EST NON AFRICAN AMERICAN: 19 mL/min — AB (ref >60)
GLUCOSE: 251 mg/dL — AB (ref 65–99)
Potassium: 3.5 mmol/L (ref 3.5–5.1)
Sodium: 154 mmol/L — ABNORMAL HIGH (ref 135–145)

## 2017-05-06 LAB — I-STAT TROPONIN, ED: Troponin i, poc: 0.21 ng/mL (ref 0.00–0.08)

## 2017-05-06 LAB — BRAIN NATRIURETIC PEPTIDE: B Natriuretic Peptide: 53 pg/mL (ref 0.0–100.0)

## 2017-05-06 LAB — CBG MONITORING, ED: Glucose-Capillary: 232 mg/dL — ABNORMAL HIGH (ref 65–99)

## 2017-05-06 LAB — I-STAT CG4 LACTIC ACID, ED
Lactic Acid, Venous: 0.87 mmol/L (ref 0.5–1.9)
Lactic Acid, Venous: 3.27 mmol/L (ref 0.5–1.9)

## 2017-05-06 MED ORDER — IBUPROFEN 100 MG/5ML PO SUSP
600.0000 mg | Freq: Once | ORAL | Status: AC
  Administered 2017-05-06: 600 mg
  Filled 2017-05-06: qty 30

## 2017-05-06 MED ORDER — SODIUM CHLORIDE 0.9 % IV SOLN
1.0000 g | Freq: Once | INTRAVENOUS | Status: AC
  Administered 2017-05-06: 1 g via INTRAVENOUS
  Filled 2017-05-06: qty 10

## 2017-05-06 MED ORDER — PIPERACILLIN-TAZOBACTAM 3.375 G IVPB 30 MIN
3.3750 g | Freq: Once | INTRAVENOUS | Status: AC
  Administered 2017-05-07: 3.375 g via INTRAVENOUS
  Filled 2017-05-06: qty 50

## 2017-05-06 MED ORDER — BETHANECHOL CHLORIDE 10 MG PO TABS
10.0000 mg | ORAL_TABLET | Freq: Three times a day (TID) | ORAL | Status: DC
  Administered 2017-05-07 – 2017-05-13 (×20): 10 mg
  Filled 2017-05-06 (×22): qty 1

## 2017-05-06 MED ORDER — LEVETIRACETAM 100 MG/ML PO SOLN
500.0000 mg | Freq: Two times a day (BID) | ORAL | Status: DC
  Administered 2017-05-07 – 2017-05-13 (×14): 500 mg
  Filled 2017-05-06 (×15): qty 5

## 2017-05-06 MED ORDER — VANCOMYCIN HCL IN DEXTROSE 1-5 GM/200ML-% IV SOLN
1000.0000 mg | Freq: Once | INTRAVENOUS | Status: AC
  Administered 2017-05-07: 1000 mg via INTRAVENOUS
  Filled 2017-05-06: qty 200

## 2017-05-06 MED ORDER — SODIUM CHLORIDE 0.9 % IV BOLUS (SEPSIS)
250.0000 mL | Freq: Once | INTRAVENOUS | Status: AC
  Administered 2017-05-06: 250 mL via INTRAVENOUS

## 2017-05-06 MED ORDER — ACETAMINOPHEN 650 MG RE SUPP: 650.0000 mg | Freq: Four times a day (QID) | RECTAL | Status: DC | PRN

## 2017-05-06 MED ORDER — ACETAMINOPHEN 325 MG RE SUPP
325.0000 mg | Freq: Once | RECTAL | Status: AC
  Administered 2017-05-06: 325 mg via RECTAL
  Filled 2017-05-06: qty 1

## 2017-05-06 MED ORDER — IOPAMIDOL (ISOVUE-370) INJECTION 76%
100.0000 mL | Freq: Once | INTRAVENOUS | Status: AC | PRN
  Administered 2017-05-06: 100 mL via INTRAVENOUS

## 2017-05-06 MED ORDER — ADENOSINE 6 MG/2ML IV SOLN
20.0000 mg | Freq: Once | INTRAVENOUS | Status: AC
  Administered 2017-05-06: 20 mg via INTRAVENOUS

## 2017-05-06 MED ORDER — ADENOSINE 6 MG/2ML IV SOLN
12.0000 mg | Freq: Once | INTRAVENOUS | Status: AC
  Administered 2017-05-06: 12 mg via INTRAVENOUS

## 2017-05-06 MED ORDER — GLYCOPYRROLATE 1 MG PO TABS
1.0000 mg | ORAL_TABLET | Freq: Two times a day (BID) | ORAL | Status: DC
  Administered 2017-05-07: 1 mg via ORAL
  Filled 2017-05-06 (×2): qty 1

## 2017-05-06 MED ORDER — INSULIN ASPART 100 UNIT/ML ~~LOC~~ SOLN
0.0000 [IU] | SUBCUTANEOUS | Status: DC
  Administered 2017-05-07: 2 [IU] via SUBCUTANEOUS
  Administered 2017-05-08 (×2): 1 [IU] via SUBCUTANEOUS
  Administered 2017-05-09: 2 [IU] via SUBCUTANEOUS
  Administered 2017-05-09 – 2017-05-13 (×10): 1 [IU] via SUBCUTANEOUS
  Filled 2017-05-06: qty 1

## 2017-05-06 MED ORDER — SODIUM CHLORIDE 0.9 % IV SOLN
500.0000 mg | Freq: Once | INTRAVENOUS | Status: AC
  Administered 2017-05-06: 500 mg via INTRAVENOUS
  Filled 2017-05-06: qty 500

## 2017-05-06 MED ORDER — RISAQUAD PO CAPS
1.0000 | ORAL_CAPSULE | Freq: Two times a day (BID) | ORAL | Status: DC
  Administered 2017-05-08 – 2017-05-13 (×12): 1 via ORAL
  Filled 2017-05-06 (×13): qty 1

## 2017-05-06 MED ORDER — SODIUM CHLORIDE 0.9 % IV BOLUS
1000.0000 mL | Freq: Once | INTRAVENOUS | Status: AC
Start: 2017-05-06 — End: 2017-05-06
  Administered 2017-05-06: 1000 mL via INTRAVENOUS

## 2017-05-06 MED ORDER — SODIUM CHLORIDE 0.9 % IV BOLUS
1000.0000 mL | Freq: Once | INTRAVENOUS | Status: AC
  Administered 2017-05-06: 1000 mL via INTRAVENOUS

## 2017-05-06 MED ORDER — ACETAMINOPHEN 325 MG PO TABS: 650.0000 mg | ORAL_TABLET | Freq: Four times a day (QID) | ORAL | Status: DC | PRN

## 2017-05-06 MED ORDER — IOPAMIDOL (ISOVUE-370) INJECTION 76%
INTRAVENOUS | Status: AC
  Administered 2017-05-06: 22:00:00
  Filled 2017-05-06: qty 100

## 2017-05-06 MED ORDER — POLYETHYLENE GLYCOL 3350 17 G PO PACK
17.0000 g | PACK | Freq: Every day | ORAL | Status: DC
  Administered 2017-05-07: 17 g
  Filled 2017-05-06: qty 1

## 2017-05-06 MED ORDER — SODIUM CHLORIDE 0.9% FLUSH
3.0000 mL | Freq: Two times a day (BID) | INTRAVENOUS | Status: DC
  Administered 2017-05-07 – 2017-05-13 (×8): 3 mL via INTRAVENOUS

## 2017-05-06 MED ORDER — ADULT MULTIVITAMIN W/MINERALS CH
1.0000 | ORAL_TABLET | Freq: Every day | ORAL | Status: DC
  Administered 2017-05-07 – 2017-05-10 (×4): 1
  Filled 2017-05-06 (×4): qty 1

## 2017-05-06 MED ORDER — ROSUVASTATIN CALCIUM 20 MG PO TABS
20.0000 mg | ORAL_TABLET | Freq: Every day | ORAL | Status: DC
  Administered 2017-05-07 – 2017-05-13 (×7): 20 mg
  Filled 2017-05-06: qty 2
  Filled 2017-05-06 (×7): qty 1

## 2017-05-06 MED ORDER — ENOXAPARIN SODIUM 60 MG/0.6ML ~~LOC~~ SOLN
60.0000 mg | SUBCUTANEOUS | Status: DC
  Administered 2017-05-07 – 2017-05-08 (×2): 60 mg via SUBCUTANEOUS
  Filled 2017-05-06 (×2): qty 0.6

## 2017-05-06 MED ORDER — SODIUM CHLORIDE 0.9 % IV BOLUS (SEPSIS)
500.0000 mL | Freq: Once | INTRAVENOUS | Status: AC
  Administered 2017-05-06: 500 mL via INTRAVENOUS

## 2017-05-06 MED ORDER — FAMOTIDINE 20 MG PO TABS
20.0000 mg | ORAL_TABLET | Freq: Two times a day (BID) | ORAL | Status: DC
  Administered 2017-05-07 – 2017-05-13 (×14): 20 mg
  Filled 2017-05-06 (×15): qty 1

## 2017-05-06 MED ORDER — SODIUM CHLORIDE 0.9 % IV BOLUS (SEPSIS)
1000.0000 mL | Freq: Once | INTRAVENOUS | Status: AC
  Administered 2017-05-06: 1000 mL via INTRAVENOUS

## 2017-05-06 MED ORDER — ACETAMINOPHEN 650 MG RE SUPP
650.0000 mg | Freq: Once | RECTAL | Status: AC
  Administered 2017-05-06: 650 mg via RECTAL

## 2017-05-06 NOTE — ED Notes (Signed)
Pt to Ct with this RN\  

## 2017-05-06 NOTE — Progress Notes (Signed)
PHARMACY NOTE: RENAL DOSAGE ADJUSTMENT  Current antimicrobial regimen includes a mismatch between LMWH dosage and estimated renal function.   Current dosage:  Lovenox 60mg  Q12H  Indication: PE/DVT  Renal Function:  Estimated Creatinine Clearance: 18.9 mL/min (A) (by C-G formula based on SCr of 2.68 mg/dL (H)). []      On intermittent HD, scheduled: []      On CRRT    Dosage has been changed to:  Lovenox 60mg  Q24H  Additional comments: Pharmacy will follow peripherally and change back to Q12H if renal function improves to >1030ml/min.  Thank you for allowing pharmacy to be a part of this patient's care.  Vernard GamblesVeronda Ranvir Renovato, PharmD, BCPS  05/06/2017 11:58 PM

## 2017-05-06 NOTE — ED Provider Notes (Signed)
[facility] Provider Note   CSN: [id number] Arrival date & time: 05/06/17  2023     History   Chief Complaint Chief Complaint  Patient presents with  . Altered Mental Status  . Tachycardia    HPI Lucienne MinksStephanie N Obrien is a 52 y.o. female.  Pt presents to the ED today with altered mental status.  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  She has a hx of DVT and PE and is on lovenox.  SNF noticed that she was having more sob today, so they called EMS.  Pt is nonverbal and is unable to give any hx.  EMS said pt's HR was in the 180s.  They gave 6 mg of adenosine, then 12 mg.  HR slowed for a few seconds after 12 mg, but went right back up.  EMS also gave 500 cc NS bolus.      Past Medical History:  Diagnosis Date  . Acute pulmonary embolism (HCC) 02/19/2015  . Acute respiratory failure (HCC)   . Diabetes mellitus without complication (HCC)    Type 2, W/o complications  . DVT (deep venous thrombosis) (HCC) 04/05/2015  . Dysphagia   . Epilepsy (HCC)   . GERD (gastroesophageal reflux disease)   . Hyperlipidemia   . Hypertension   . IBS (irritable bowel syndrome)   . Nontraumatic subarachnoid hemorrhage (HCC)   . SAH (subarachnoid hemorrhage) (HCC)   . Urinary retention     Patient Active Problem List   Diagnosis Date Noted  . AKI (acute kidney injury) (HCC) 05/06/2017  . Hypernatremia 05/06/2017  . Dyslipidemia associated with type 2 diabetes mellitus (HCC) 04/27/2017  . Gingivitis, acute, plaque induced 03/26/2017  . Hypersecretion of saliva 03/14/2017  . Bruxism (teeth grinding) 03/14/2017  . Lobar pneumonia (HCC)   . Bandemia   . Dehydration   . Acute on chronic respiratory failure with hypoxia (HCC)   . Pressure injury of skin 02/16/2017  . SOB (shortness of breath)   . Fever   . Severe sepsis (HCC) 02/15/2017  . Foot drop, right foot 10/18/2016    . Cough, persistent 09/17/2016  . CVA (cerebrovascular accident) (HCC) 08/30/2016  . GERD without esophagitis 08/30/2016  . History of stroke 08/16/2016  . Localized swelling on left hand 08/16/2016  . Pain and swelling of left wrist 08/16/2016  . Chronic pulmonary embolism (HCC) 07/03/2016  . Weight loss, non-intentional 06/13/2016  . Hemiparesis affecting right side as late effect of stroke (HCC) 09/25/2015  . Type II diabetes mellitus with neurological manifestations (HCC) 05/20/2015  . Essential hypertension, benign 05/20/2015  . Status post insertion of percutaneous endoscopic gastrostomy (PEG) tube (HCC) 05/20/2015  . DVT (deep venous thrombosis) (HCC) 04/05/2015  . Protein-calorie malnutrition, severe (HCC) 03/25/2015  . Dysphagia 02/18/2015  . Seizures (HCC) 02/18/2015  . Elevated troponin 02/18/2015  . Urine retention 02/18/2015  . History of ETT   . Subarachnoid hemorrhage [facility](HCC)     Past Surgical History:  Procedure Laterality Date  . ABDOMINAL SURGERY    . ANEURYSM COILING    . COLONOSCOPY N/A 02/20/2015   Procedure: COLONOSCOPY;  Surgeon: Iva Booparl E Gessner, MD;  [facility];  Service: Endoscopy;  Laterality: N/A;  . ESOPHAGOGASTRODUODENOSCOPY (EGD) WITH PROPOFOL N/A 02/02/2015   Procedure: ESOPHAGOGASTRODUODENOSCOPY (EGD) WITH PROPOFOL;  Surgeon: Jimmye NormanJames Wyatt, MD;  [facility];  Service: General;  Laterality: N/A;  . IR GENERIC  HISTORICAL  08/26/2015   IR GASTRIC TUBE PERC CHG W/O IMG GUIDE 08/26/2015 Irish Lack, MD [facility]-INTERV RAD  . IR GENERIC HISTORICAL  09/14/2015   IR REPLC GASTRO/COLONIC TUBE PERCUT W/FLUORO 09/14/2015 Darrell K Allred, PA-C [facility]-INTERV RAD  . IR REPLACE G-TUBE SIMPLE WO FLUORO  06/08/2016  . IR REPLACE G-TUBE SIMPLE WO FLUORO  10/24/2016  . IR REPLACE G-TUBE SIMPLE WO FLUORO  01/07/2017  . PEG PLACEMENT N/A 02/02/2015   Procedure: PERCUTANEOUS ENDOSCOPIC GASTROSTOMY (PEG) PLACEMENT;  Surgeon: Jimmye Norman, MD;  Location: [facility] ENDOSCOPY;   Service: General;  Laterality: N/A;  . RADIOLOGY WITH ANESTHESIA N/A 01/13/2015   Procedure: RADIOLOGY WITH ANESTHESIA;  Surgeon: Lisbeth Renshaw, MD;  Location: [facility] OR;  Service: Radiology;  Laterality: N/A;  . TRACHEOSTOMY       OB History   None      Home Medications    Prior to Admission medications   Medication Sig Start Date End Date Taking? Authorizing Provider  acetaminophen (TYLENOL) 325 MG tablet Place 650 mg into feeding tube every 4 (four) hours as needed for mild pain or fever.    Yes [provider]  bethanechol (URECHOLINE) 10 MG tablet Place 10 mg into feeding tube 3 (three) times daily.    Yes [provider]  enoxaparin (LOVENOX) 60 MG/0.6ML injection Inject 0.6 mLs (60 mg total) into the skin 2 (two) times daily. 04/22/17 05/22/17 Yes Sharee Holster, NP  gabapentin (NEURONTIN) 100 MG capsule Take 200 mg by mouth 2 (two) times daily. HOLD IF LETHARGIC 04/04/17  Yes Ellwood Sayers, MD  glycopyrrolate (ROBINUL) 1 MG tablet Take 1 mg by mouth 2 (two) times daily.  04/03/17  Yes [provider]  levETIRAcetam (KEPPRA) 100 MG/ML solution Place 5 mLs (500 mg total) into feeding tube 2 (two) times daily. 02/11/15  Yes Leroy Sea, MD  loperamide (IMODIUM A-D) 2 MG tablet Take 1 tablet (2 mg total) by mouth 4 (four) times daily as needed for diarrhea or loose stools. Patient taking differently: Place 2 mg into feeding tube every 6 (six) hours as needed (for diarrhea).  02/24/17  Yes Calvert Cantor, MD  Multiple Vitamins-Minerals (MULTIVITAMIN WITH MINERALS) tablet Place 1 tablet into feeding tube daily.   Yes [provider]  ondansetron (ZOFRAN) 4 MG tablet Place 4 mg into feeding tube daily.    Yes [provider]  polyethylene glycol powder (GLYCOLAX/MIRALAX) powder Place 17 g into feeding tube daily.    Yes [provider]  Probiotic Product (PROBIOTIC DAILY) CAPS Place 1 capsule into feeding tube 2 (two) times daily.  FOR 82 DAYS FOR GINGIVITIS 04/03/17 06/24/17 Yes [provider]  ranitidine (ZANTAC) 150 MG tablet Take 150 mg by mouth daily.  04/25/17  Yes [provider]  rosuvastatin (CRESTOR) 20 MG tablet Place 20 mg into feeding tube at bedtime.  04/25/17  Yes [provider]  tetracycline (ACHROMYCIN,SUMYCIN) 250 MG capsule Place 250 mg into feeding tube 2 (two) times daily. Related to chronic gingivitis, plaque induced for 60 days.    Yes [provider]  traZODone (DESYREL) 50 MG tablet Take 1 tablet (50 mg total) by mouth at bedtime. Patient taking differently: Place 50 mg into feeding tube at bedtime.  02/24/17  Yes Calvert Cantor, MD  Amino Acids-Protein Hydrolys (FEEDING SUPPLEMENT, PRO-STAT SUGAR FREE 64,) LIQD Place 60 mLs into feeding tube 3 (three) times daily with meals. Patient not taking: Reported on 05/06/2017 02/22/17   Calvert Cantor, MD  Multiple  Vitamins-Minerals (DECUBI-VITE) CAPS Place 1 capsule into feeding tube daily.     [provider]  Neomycin-Bacitracin-Polymyxin (NEOMYCIN-POLYMYXIN-BACITRACIN EX) Apply topically daily to right posterior heel and cover with dry dressing 04/24/17   [provider]  Nutritional Supplements (FEEDING SUPPLEMENT, JEVITY 1.5 CAL/FIBER,) LIQD Place 1,000 mLs into feeding tube continuous. Patient not taking: Reported on 05/06/2017 02/22/17   Calvert Cantor, MD  oxyCODONE (ROXICODONE) 5 MG immediate release tablet Place 1 tablet (5 mg total) into feeding tube every 4 (four) hours as needed for severe pain. Patient not taking: Reported on 05/06/2017 02/24/17   Calvert Cantor, MD  WATER FOR INJECTION STERILE IJ Place 30-60 mLs into feeding tube. Flush with 30-60 ml water before and after meds, before initiating feedings or when there is an interruption of feeding to maintain patency.  Also flush every shift with 5-10 ml water between each medication    [provider]  Water For Irrigation, Sterile (FREE WATER) SOLN  Place 200 mLs into feeding tube 4 (four) times daily. Patient not taking: Reported on 05/06/2017 02/22/17   Calvert Cantor, MD    Family History Family History  Problem Relation Age of Onset  . Hypertension Other     Social History Social History   Tobacco Use  . Smoking status: Former Games developer  . Smokeless tobacco: Never Used  Substance Use Topics  . Alcohol use: No  . Drug use: No     Allergies   Patient has no known allergies.   Review of Systems Review of Systems  Unable to perform ROS: Patient nonverbal     Physical Exam Updated Vital Signs BP 106/76   Pulse (!) 117   Temp (!) 103.8 F (39.9 C) (Rectal)   Resp (!) 22   Ht 5' (1.524 m)   Wt 56.7 kg (125 lb)   LMP  (LMP Unknown)   SpO2 94%   BMI 24.41 kg/m   Physical Exam  Constitutional: She appears distressed.  HENT:  Head: Normocephalic and atraumatic.  Right Ear: External ear normal.  Left Ear: External ear normal.  Nose: Nose normal.  Mouth/Throat: Mucous membranes are dry.  Eyes: Pupils are equal, round, and reactive to light. Conjunctivae and EOM are normal.  Neck: Normal range of motion. Neck supple.  Cardiovascular: Regular rhythm. Tachycardia present.  Pulmonary/Chest: Accessory muscle usage present. Tachypnea noted.  Abdominal: Soft. Bowel sounds are normal.  g-tube in place with no signs/sx infxn.  Neurological:  Pt awake.  Chronically nonverbal.  Skin: She is diaphoretic.  Nursing note and vitals reviewed.    ED Treatments / Results  Labs (all labs ordered are listed, but only abnormal results are displayed) Labs Reviewed  BASIC METABOLIC PANEL - Abnormal; Notable for the following components:      Result Value   Sodium 154 (*)    Chloride 123 (*)    CO2 21 (*)    Glucose, Bld 251 (*)    BUN 68 (*)    Creatinine, Ser 2.68 (*)    GFR calc non Af Amer 19 (*)    GFR calc Af Amer 22 (*)    All other components within normal limits  CBC WITH DIFFERENTIAL/PLATELET - Abnormal;  Notable for the following components:   WBC 16.7 (*)    HCT 47.9 (*)    Neutro Abs 13.7 (*)    Monocytes Absolute 1.5 (*)    All other components within normal limits  URINALYSIS, ROUTINE W REFLEX MICROSCOPIC - Abnormal; Notable for the  following components:   Color, Urine AMBER (*)    APPearance CLOUDY (*)    Glucose, UA 50 (*)    Hgb urine dipstick SMALL (*)    Ketones, ur 5 (*)    Protein, ur >=300 (*)    Bacteria, UA RARE (*)    Squamous Epithelial / LPF 0-5 (*)    Non Squamous Epithelial 0-5 (*)    All other components within normal limits  I-STAT TROPONIN, ED - Abnormal; Notable for the following components:   Troponin i, poc 0.21 (*)    All other components within normal limits  I-STAT CG4 LACTIC ACID, ED - Abnormal; Notable for the following components:   Lactic Acid, Venous 3.27 (*)    All other components within normal limits  CBG MONITORING, ED - Abnormal; Notable for the following components:   Glucose-Capillary 232 (*)    All other components within normal limits  CULTURE, BLOOD (ROUTINE X 2)  CULTURE, BLOOD (ROUTINE X 2)  URINE CULTURE  BRAIN NATRIURETIC PEPTIDE  I-STAT CG4 LACTIC ACID, ED    EKG None  Radiology Ct Angio Chest Pe W And/or Wo Contrast  Result Date: 05/06/2017 CLINICAL DATA:  52 y/o  F; altered mental status and tachycardia. EXAM: CT ANGIOGRAPHY CHEST WITH CONTRAST TECHNIQUE: Multidetector CT imaging of the chest was performed using the standard protocol during bolus administration of intravenous contrast. Multiplanar CT image reconstructions and MIPs were obtained to evaluate the vascular anatomy. CONTRAST:  ISOVUE-370 IOPAMIDOL (ISOVUE-370) INJECTION 76% COMPARISON:  02/17/2015 CT angiogram chest. FINDINGS: Cardiovascular: Normal heart size. No pericardial effusion. Normal caliber thoracic aorta with mild calcific atherosclerosis. Moderate coronary artery calcification. Normal caliber main pulmonary artery. Satisfactory opacification of the  pulmonary arteries. Mediastinum/Nodes: No enlarged mediastinal, hilar, or axillary lymph nodes. Thyroid gland, trachea, and esophagus demonstrate no acute findings. Mildly patulous esophagus. Lungs/Pleura: Diffuse peribronchial thickening with debris in the mainstem bronchi extending predominantly into the lower lobes bilaterally. No consolidation, effusion, or pneumothorax. Upper Abdomen: No acute abnormality. Musculoskeletal: No chest wall abnormality. No acute or significant osseous findings. Review of the MIP images confirms the above findings. IMPRESSION: 1. No pulmonary embolus identified. 2. Diffuse peribronchial thickening with debris in the mainstem bronchi and extending into the lower lobes. Findings may represent acute bronchitis or aspiration given the distribution. No consolidation. 3. Mildly patulous esophagus. 4. Aorta and coronary artery calcific atherosclerosis. Electronically Signed   By: Mitzi Hansen M.D.   On: 05/06/2017 22:46   Dg Chest Port 1 View  Result Date: 05/06/2017 CLINICAL DATA:  Shortness of breath. EXAM: PORTABLE CHEST 1 VIEW COMPARISON:  Radiograph February 20, 2017. FINDINGS: The heart size and mediastinal contours are within normal limits. Both lungs are clear. No pneumothorax or pleural effusion is noted. The visualized skeletal structures are unremarkable. IMPRESSION: No acute cardiopulmonary abnormality seen. Electronically Signed   By: Lupita Raider, M.D.   On: 05/06/2017 20:49    Procedures Procedures (including critical care time)  Medications Ordered in ED Medications  sodium chloride 0.9 % bolus 1,000 mL (1,000 mLs Intravenous New Bag/Given 05/06/17 2323)  sodium chloride 0.9 % bolus 1,000 mL (1,000 mLs Intravenous New Bag/Given 05/06/17 2323)  sodium chloride 0.9 % bolus 1,000 mL (0 mLs Intravenous Stopped 05/06/17 2209)  adenosine (ADENOCARD) 6 MG/2ML injection 12 mg (12 mg Intravenous Given 05/06/17 2029)  adenosine (ADENOCARD) 6 MG/2ML injection 20  mg (20 mg Intravenous Given 05/06/17 2034)  acetaminophen (TYLENOL) suppository 650 mg (650 mg Rectal Given 05/06/17  2115)  acetaminophen (TYLENOL) suppository 325 mg (325 mg Rectal Given 05/06/17 2126)  sodium chloride 0.9 % bolus 1,000 mL (0 mLs Intravenous Stopped 05/06/17 2314)    And  sodium chloride 0.9 % bolus 500 mL (0 mLs Intravenous Stopped 05/06/17 2314)    And  sodium chloride 0.9 % bolus 250 mL (0 mLs Intravenous Stopped 05/06/17 2315)  cefTRIAXone (ROCEPHIN) 1 g in sodium chloride 0.9 % 100 mL IVPB (0 g Intravenous Stopped 05/06/17 2209)  azithromycin (ZITHROMAX) 500 mg in sodium chloride 0.9 % 250 mL IVPB (0 mg Intravenous Stopped 05/06/17 2315)  iopamidol (ISOVUE-370) 76 % injection (  Contrast Given 05/06/17 2145)  ibuprofen (ADVIL,MOTRIN) 100 MG/5ML suspension 600 mg (600 mg Per Tube Given 05/06/17 2316)  iopamidol (ISOVUE-370) 76 % injection 100 mL (100 mLs Intravenous Contrast Given 05/06/17 2208)     Initial Impression / Assessment and Plan / ED Course  I have reviewed the triage vital signs and the nursing notes.  Pertinent labs & imaging results that were available during my care of the patient were reviewed by me and considered in my medical decision making (see chart for details).  When pt first arrived, HR b/t 180s to 200.  I thought she was in SVT.  She was given 12 mg adenosine without help.  She was then given 18 mg with brief slow down.  At this point, we were able to obtain a rectal temp which showed pt was febrile.  The pt given IVFs and antipyretics to decrease hr.  This has helped, but she is still tachycardic and blood pressures are soft.  The pt was d/w Dr. Arsenio Loader (CCM) who recommends more IVFs.  He does not think she needs ICU as she is not intubated and is not on pressors.  Pt d/w Dr. Antionette Char (triad) for admission.    CRITICAL CARE Performed by: Jacalyn Lefevre   Total critical care time: 60 minutes  Critical care time was exclusive of separately billable procedures and  treating other patients.  Critical care was necessary to treat or prevent imminent or life-threatening deterioration.  Critical care was time spent personally by me on the following activities: development of treatment plan with patient and/or surrogate as well as nursing, discussions with consultants, evaluation of patient's response to treatment, examination of patient, obtaining history from patient or surrogate, ordering and performing treatments and interventions, ordering and review of laboratory studies, ordering and review of radiographic studies, pulse oximetry and re-evaluation of patient's condition.  Final Clinical Impressions(s) / ED Diagnoses   Final diagnoses:  Sepsis, due to unspecified organism (HCC)  Hypernatremia  AKI (acute kidney injury) (HCC)  Elevated troponin  SVT (supraventricular tachycardia) (HCC)  Demand ischemia [facility])    ED Discharge Orders    None       Jacalyn Lefevre, MD 05/06/17 2329

## 2017-05-06 NOTE — ED Triage Notes (Signed)
[facility] for AMS and tachycardia, pt pale, diaphoretic HR 180-190s, tachypnic rr 50. Received 6mg  and then 12mg  of adenosine with ems without change in hr. Hypotensive. EDP at bedside

## 2017-05-06 NOTE — ED Notes (Signed)
EMERGENCY DEPARTMENT  US GUIDANCE EXAM Emergency Ultrasound:  US Guidance for Needle Guidance  INDICATIONS: Difficult vascular access Linear probe used in real-time to visualize location of needle entry through skin.   PERFORMED BY: Myself IMAGES ARCHIVED?: no LIMITATIONS: none VIEWS USED: Transverse INTERPRETATION: Needle visualized within vein   Tolerated well w/no complications.   Arlys JohnBrian RN  9:05 PM 05/06/17

## 2017-05-06 NOTE — ED Notes (Signed)
Pt on 3L [place] 

## 2017-05-06 NOTE — Progress Notes (Addendum)
Pharmacy Antibiotic Note  Rachel Vang is a 52 y.o. female admitted on 05/06/2017 from nursing home with tachycardia and tachypnea. She is admitted with an AKI and eCrCl 18 ml/min. She received azithromycin + ceftriaxone x1 in the ED, abx are now being broadened to vancomycin and zosyn since pt is from outpatient facility - for sepsis.   Plan: -Vancomycin x1 then 500 mg IV q48h -Zosyn 2.25 g IV q8h -Monitor renal fx, cultures, VR as needed    Height: 5' (152.4 cm) Weight: 125 lb (56.7 kg) IBW/kg (Calculated) : 45.5  Temp (24hrs), Avg:103.8 F (39.9 C), Min:103.8 F (39.9 C), Max:103.8 F (39.9 C)  Recent Labs  Lab 05/06/17 2023 05/06/17 2102 05/06/17 2332  WBC 16.7*  --   --   CREATININE 2.68*  --   --   LATICACIDVEN  --  3.27* 0.87     Antimicrobials this admission: 4/8 azithromycin x1 4/8 ceftriaxone x1 4/8 vancomycin > 4/8 zosyn >   Dose adjustments this admission: N/A   Microbiology results: 4/8 urine cx: 4/8 blood cx:   Rachel Vang, Rachel Vang 05/06/2017 11:50 PM

## 2017-05-06 NOTE — H&P (Signed)
History and Physical    Rachel Vang ZOX:[id number] DOB: Jun 12, 1965 DOA: 05/06/2017  PCP: Kirt Boys, DO   Patient coming from: Nursing Home  Chief Complaint: Tachypnea, tachycardia   HPI: Rachel Vang is a 52 y.o. 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  Patient is reportedly bedbound, nonverbal, and dependent on PEG tube at her baseline.  She was noted by nursing home personnel to be tachypneic and tachycardic today and EMS was called.  Patient was suspected to be in SVT and was treated with adenosine 6 mg, and then 12 mg prior to arrival in the ED.  There was no significant change in heart rate with adenosine and she was also given 500 cc of normal saline.  Patient is unable to contribute to the history due to her clinical condition.  ED Course: Upon arrival to the ED, patient is found to be febrile to 39.9 C, tachycardic to 170, tachypneic, and slightly hypotensive.  EKG features supraventricular tachycardia with rate 171 and chest x-ray is negative for acute cardiopulmonary disease.  Chemistry panel is notable for a sodium of 154, BUN 68, and creatinine 2.68, up from 0.57 in January.  CBC features a leukocytosis to 16,700 and lactic acid is elevated to 3.27.  Troponin is elevated to 0.21.  Urinalysis is unremarkable.  CTA chest is negative for PE, but notable for diffuse peribronchial thickening with debris in the bronchi, concerning for possible acute bronchitis or aspiration.  Blood and urine cultures were collected in the ED, 4.75 L of normal saline was given, and the patient was started on Rocephin and azithromycin.  PCCM was consulted by the ED physician and recommended a medical admission to the stepdown unit.  Heart rate has improved with the  IV fluids, MAP remains greater than 65, and the patient will be admitted to the stepdown unit for ongoing evaluation and management of severe sepsis suspected secondary to a pulmonary source.  Review of Systems:  Unable to complete ROS secondary to patient's clinical condition.  Past Medical History:  Diagnosis Date  . Acute pulmonary embolism (HCC) 02/19/2015  . Acute respiratory failure (HCC)   . Diabetes mellitus without complication (HCC)    Type 2, W/o complications  . DVT (deep venous thrombosis) (HCC) 04/05/2015  . Dysphagia   . Epilepsy (HCC)   . GERD (gastroesophageal reflux disease)   . Hyperlipidemia   . Hypertension   . IBS (irritable bowel syndrome)   . Nontraumatic subarachnoid hemorrhage (HCC)   . SAH (subarachnoid hemorrhage) (HCC)   . Urinary retention     Past Surgical History:  Procedure Laterality Date  . ABDOMINAL SURGERY    . ANEURYSM COILING    . COLONOSCOPY N/A 02/20/2015   Procedure: COLONOSCOPY;  Surgeon: Iva Boop, MD;  Location: [facility] ENDOSCOPY;  Service: Endoscopy;  Laterality: N/A;  . ESOPHAGOGASTRODUODENOSCOPY (EGD) WITH PROPOFOL N/A 02/02/2015   Procedure: ESOPHAGOGASTRODUODENOSCOPY (EGD) WITH PROPOFOL;  Surgeon: Jimmye Norman, MD;  Location: [facility] ENDOSCOPY;  Service: General;  Laterality: N/A;  . IR GENERIC HISTORICAL  08/26/2015   IR GASTRIC TUBE PERC CHG W/O IMG GUIDE 08/26/2015 Irish Lack, MD [facility]-INTERV RAD  . IR GENERIC HISTORICAL  09/14/2015   IR REPLC GASTRO/COLONIC TUBE PERCUT W/FLUORO 09/14/2015 Darrell K Allred, PA-C [facility]-INTERV RAD  . IR REPLACE G-TUBE SIMPLE WO  FLUORO  06/08/2016  . IR REPLACE G-TUBE SIMPLE WO FLUORO  10/24/2016  . IR REPLACE G-TUBE SIMPLE WO FLUORO  01/07/2017  . PEG PLACEMENT N/A 02/02/2015   Procedure: PERCUTANEOUS ENDOSCOPIC GASTROSTOMY (PEG) PLACEMENT;  Surgeon: Jimmye Norman, MD;  Location: [facility] ENDOSCOPY;  Service: General;  Laterality: N/A;  . RADIOLOGY WITH ANESTHESIA N/A 01/13/2015   Procedure: RADIOLOGY WITH ANESTHESIA;   Surgeon: Lisbeth Renshaw, MD;  Location: [facility] OR;  Service: Radiology;  Laterality: N/A;  . TRACHEOSTOMY       reports that she has quit smoking. She has never used smokeless tobacco. She reports that she does not drink alcohol or use drugs.  No Known Allergies  Family History  Problem Relation Age of Onset  . Hypertension Other      Prior to Admission medications   Medication Sig Start Date End Date Taking? Authorizing Provider  acetaminophen (TYLENOL) 325 MG tablet Place 650 mg into feeding tube every 4 (four) hours as needed for mild pain or fever.    Yes [provider]  bethanechol (URECHOLINE) 10 MG tablet Place 10 mg into feeding tube 3 (three) times daily.    Yes [provider]  enoxaparin (LOVENOX) 60 MG/0.6ML injection Inject 0.6 mLs (60 mg total) into the skin 2 (two) times daily. 04/22/17 05/22/17 Yes Sharee Holster, NP  gabapentin (NEURONTIN) 100 MG capsule Take 200 mg by mouth 2 (two) times daily. HOLD IF LETHARGIC 04/04/17  Yes Ellwood Sayers, MD  glycopyrrolate (ROBINUL) 1 MG tablet Take 1 mg by mouth 2 (two) times daily.  04/03/17  Yes [provider]  levETIRAcetam (KEPPRA) 100 MG/ML solution Place 5 mLs (500 mg total) into feeding tube 2 (two) times daily. 02/11/15  Yes Leroy Sea, MD  loperamide (IMODIUM A-D) 2 MG tablet Take 1 tablet (2 mg total) by mouth 4 (four) times daily as needed for diarrhea or loose stools. Patient taking differently: Place 2 mg into feeding tube every 6 (six) hours as needed (for diarrhea).  02/24/17  Yes Calvert Cantor, MD  Multiple Vitamins-Minerals (MULTIVITAMIN WITH MINERALS) tablet Place 1 tablet into feeding tube daily.   Yes [provider]  ondansetron (ZOFRAN) 4 MG tablet Place 4 mg into feeding tube daily.    Yes [provider]  polyethylene glycol powder (GLYCOLAX/MIRALAX) powder Place 17 g into feeding tube daily.    Yes [provider]  Probiotic Product (PROBIOTIC DAILY)  CAPS Place 1 capsule into feeding tube 2 (two) times daily. FOR 82 DAYS FOR GINGIVITIS 04/03/17 06/24/17 Yes [provider]  ranitidine (ZANTAC) 150 MG tablet Take 150 mg by mouth daily.  04/25/17  Yes [provider]  rosuvastatin (CRESTOR) 20 MG tablet Place 20 mg into feeding tube at bedtime.  04/25/17  Yes [provider]  tetracycline (ACHROMYCIN,SUMYCIN) 250 MG capsule Place 250 mg into feeding tube 2 (two) times daily. Related to chronic gingivitis, plaque induced for 60 days.    Yes [provider]  traZODone (DESYREL) 50 MG tablet Take 1 tablet (50 mg total) by mouth at bedtime. Patient taking differently: Place 50 mg into feeding tube at bedtime.  02/24/17  Yes Calvert Cantor, MD  Amino Acids-Protein Hydrolys (FEEDING SUPPLEMENT, PRO-STAT SUGAR FREE 64,) LIQD Place 60 mLs into feeding tube 3 (three) times daily with meals. Patient not taking: Reported on 05/06/2017 02/22/17   Calvert Cantor, MD  Multiple Vitamins-Minerals (DECUBI-VITE) CAPS Place 1 capsule into feeding tube daily.     [provider]  Neomycin-Bacitracin-Polymyxin (NEOMYCIN-POLYMYXIN-BACITRACIN EX) Apply topically daily to right posterior heel and cover with dry dressing 04/24/17   [provider]  Nutritional Supplements (FEEDING SUPPLEMENT, JEVITY 1.5 CAL/FIBER,) LIQD Place 1,000 mLs into feeding tube continuous. Patient not taking: Reported on 05/06/2017 02/22/17   Calvert Cantorizwan, Saima, MD  oxyCODONE (ROXICODONE) 5 MG immediate release tablet Place 1 tablet (5 mg total) into feeding tube every 4 (four) hours as needed for severe pain. Patient not taking: Reported on 05/06/2017 02/24/17   Calvert Cantorizwan, Saima, MD  WATER FOR INJECTION STERILE IJ Place 30-60 mLs into feeding tube. Flush with 30-60 ml water before and after meds, before initiating feedings or when there is an interruption of feeding to maintain patency.  Also flush every shift with 5-10 ml water between each medication    [provider]  Water For Irrigation, Sterile (FREE WATER) SOLN Place 200 mLs into feeding tube 4 (four) times daily. Patient not taking: Reported on 05/06/2017 02/22/17   Calvert Cantorizwan, Saima, MD    Physical Exam: Vitals:   05/06/17 2230 05/06/17 2245 05/06/17 2300 05/06/17 2315  BP: 102/70 100/72 106/76 101/79  Pulse: (!) 118 (!) 118 (!) 117 (!) 116  Resp: (!) 24 (!) 23 (!) 22 20  Temp:      TempSrc:      SpO2: 94% 93% 94% 95%  Weight:      Height:          Constitutional: Tachypneic, diaphoretic, no pallor or cyanosis  Eyes: PERTLA, lids and conjunctivae normal ENMT: Mucous membranes are dry. Posterior pharynx clear of any exudate or lesions.   Neck: normal, supple, no masses, no thyromegaly Respiratory: Tachypnea, diffuse rhonchi. No accessory muscle use.  Cardiovascular: Rate ~120 and regular. No extremity edema. No significant JVD. Abdomen: No distension, no tenderness, soft. Bowel sounds active.  Musculoskeletal: no clubbing / cyanosis. No joint deformity upper and lower extremities.    Skin: no significant rashes, lesions, ulcers. Poor turgor. Neurologic: No gross facial asymmetry. Pupils equal and sluggishly reactive.      Labs on Admission: I have personally reviewed following labs and imaging studies  CBC: Recent Labs  Lab 05/06/17 2023  WBC 16.7*  NEUTROABS 13.7*  HGB 14.5  HCT 47.9*  MCV 96.8  PLT 306   Basic Metabolic Panel: Recent Labs  Lab 05/06/17 2023  NA 154*  K 3.5  CL 123*  CO2 21*  GLUCOSE 251*  BUN 68*  CREATININE 2.68*  CALCIUM 9.1   GFR: Estimated Creatinine Clearance: 18.9 mL/min (A) (by C-G formula based on SCr of 2.68 mg/dL (H)). Liver Function Tests: No results for input(s): AST, ALT, ALKPHOS, BILITOT, PROT, ALBUMIN in the last 168 hours. No results for input(s): LIPASE, AMYLASE in the last 168 hours. No results for input(s): AMMONIA in the last 168 hours. Coagulation Profile: No results for input(s): INR, PROTIME in the last 168  hours. Cardiac Enzymes: No results for input(s): CKTOTAL, CKMB, CKMBINDEX, TROPONINI in the last 168 hours. BNP (last 3 results) No results for input(s): PROBNP in the last 8760 hours. HbA1C: No results for input(s): HGBA1C in the last 72 hours. CBG: Recent Labs  Lab 05/06/17 2103  GLUCAP 232*   Lipid Profile: No results for input(s): CHOL, HDL, LDLCALC, TRIG, CHOLHDL, LDLDIRECT in the last 72 hours. Thyroid Function Tests: No results for input(s): TSH, T4TOTAL, FREET4, T3FREE, THYROIDAB in the last 72 hours. Anemia Panel: No results for input(s): VITAMINB12, FOLATE, FERRITIN, TIBC, IRON, RETICCTPCT in the last 72 hours. Urine  analysis:    Component Value Date/Time   COLORURINE AMBER (A) 05/06/2017 2024   APPEARANCEUR CLOUDY (A) 05/06/2017 2024   LABSPEC 1.026 05/06/2017 2024   PHURINE 5.0 05/06/2017 2024   GLUCOSEU 50 (A) 05/06/2017 2024   HGBUR SMALL (A) 05/06/2017 2024   BILIRUBINUR NEGATIVE 05/06/2017 2024   KETONESUR 5 (A) 05/06/2017 2024   PROTEINUR >=300 (A) 05/06/2017 2024   NITRITE NEGATIVE 05/06/2017 2024   LEUKOCYTESUR NEGATIVE 05/06/2017 2024   Sepsis Labs: @LABRCNTIP (procalcitonin:4,lacticidven:4) )No results found for this or any previous visit (from the past 240 hour(s)).   Radiological Exams on Admission: Ct Angio Chest Pe W And/or Wo Contrast  Result Date: 05/06/2017 CLINICAL DATA:  52 y/o  F; altered mental status and tachycardia. EXAM: CT ANGIOGRAPHY CHEST WITH CONTRAST TECHNIQUE: Multidetector CT imaging of the chest was performed using the standard protocol during bolus administration of intravenous contrast. Multiplanar CT image reconstructions and MIPs were obtained to evaluate the vascular anatomy. CONTRAST:  ISOVUE-370 IOPAMIDOL (ISOVUE-370) INJECTION 76% COMPARISON:  02/17/2015 CT angiogram chest. FINDINGS: Cardiovascular: Normal heart size. No pericardial effusion. Normal caliber thoracic aorta with mild calcific atherosclerosis. Moderate  coronary artery calcification. Normal caliber main pulmonary artery. Satisfactory opacification of the pulmonary arteries. Mediastinum/Nodes: No enlarged mediastinal, hilar, or axillary lymph nodes. Thyroid gland, trachea, and esophagus demonstrate no acute findings. Mildly patulous esophagus. Lungs/Pleura: Diffuse peribronchial thickening with debris in the mainstem bronchi extending predominantly into the lower lobes bilaterally. No consolidation, effusion, or pneumothorax. Upper Abdomen: No acute abnormality. Musculoskeletal: No chest wall abnormality. No acute or significant osseous findings. Review of the MIP images confirms the above findings. IMPRESSION: 1. No pulmonary embolus identified. 2. Diffuse peribronchial thickening with debris in the mainstem bronchi and extending into the lower lobes. Findings may represent acute bronchitis or aspiration given the distribution. No consolidation. 3. Mildly patulous esophagus. 4. Aorta and coronary artery calcific atherosclerosis. Electronically Signed   By: Mitzi Hansen M.D.   On: 05/06/2017 22:46   Dg Chest Port 1 View  Result Date: 05/06/2017 CLINICAL DATA:  Shortness of breath. EXAM: PORTABLE CHEST 1 VIEW COMPARISON:  Radiograph February 20, 2017. FINDINGS: The heart size and mediastinal contours are within normal limits. Both lungs are clear. No pneumothorax or pleural effusion is noted. The visualized skeletal structures are unremarkable. IMPRESSION: No acute cardiopulmonary abnormality seen. Electronically Signed   By: Lupita Raider, M.D.   On: 05/06/2017 20:49    EKG: Independently reviewed. Supraventricular tachycardia (rate 171).   Assessment/Plan   1. Sepsis  - Presents from nursing home with tachypnea and tachycardia, found to be febrile with leukocytosis and elevated lactate  - She was treated with 500 cc NS by EMS, and also given adenosine 6 mg and then 12 mg for suspected SVT  - UA does not appear infected; CXR unremarkable;  CTA chest neg for PE but notable for diffuse peribronchial thickening and debris in bronchi concerning for acute bronchitis or aspiration  - Blood cultures collected in ED, additional 4.75 L NS given, and she was treated with Rocephin and azithromycin for suspected PNA  - HR improved with IVF  - Continue empiric abx with vancomycin and Zosyn for suspected respiratory source, possibly aspiration, in setting of nursing home residence and recent hospitalization  - Follow cultures and clinical course    2. Acute kidney injury  - SCr is 2.68 on admission, up from 0.57 in January 2019   - Likely prerenal azotemia in setting of sepsis and hypovolemia; ATN  possible  - Has been fluid-resuscitated in ED  - Check urine studies, renally-dose medications, repeat chem panel   3. Hypernatremia  - Serum sodium is 154 on admission in setting of hypovolemia  - She was treated with 500 cc NS by EMS and given 4.75 liters NS in ED  - Repeat chem panel now and change IVF as needed    4. Elevated troponin  - Troponin is elevated to 0.21 on admission  - Likely reflects demand ischemia in setting of severe sepsis   - Continue cardiac monitoring, trend troponin, repeat EKG, continue statin   5. Hx of CVA with hemiparesis, PEG tube   - Reportedly bed-bound and non-verbal at baseline  - Continue statin    6. Type II DM  - A1c was 6.1% in January 2019  - Currently diet-controlled but serum glucose 250 on admission, possibly from sepsis  - Follow CBG's and use a SSI with Novolog as needed    7. Seizure disorder  - No seizure-like activity on admission  - Continue Keppra    8. Hx of DVT/PE  - CTA chest performed in ED and neg for acute PE  - Continue treatment-dose Lovenox    DVT prophylaxis: Treatment-dose Lovenox Code Status: Full  Family Communication: Discussed with patient Consults called: PCCM  Admission status: Inpatient    Briscoe Deutscher, MD [facility] Pager [phone]  If  7PM-7AM, please contact night-coverage [email or web address] Password [facility]  05/06/2017, 11:44 PM

## 2017-05-07 ENCOUNTER — Inpatient Hospital Stay (HOSPITAL_COMMUNITY): Payer: Medicaid Other

## 2017-05-07 ENCOUNTER — Encounter (HOSPITAL_COMMUNITY): Payer: Self-pay | Admitting: General Surgery

## 2017-05-07 DIAGNOSIS — A419 Sepsis, unspecified organism: Secondary | ICD-10-CM

## 2017-05-07 DIAGNOSIS — R6520 Severe sepsis without septic shock: Secondary | ICD-10-CM

## 2017-05-07 LAB — TROPONIN I
TROPONIN I: 0.14 ng/mL — AB (ref <0.03)
TROPONIN I: 0.2 ng/mL — AB (ref <0.03)
Troponin I: 0.06 ng/mL (ref <0.03)

## 2017-05-07 LAB — BLOOD CULTURE ID PANEL (REFLEXED)
Acinetobacter baumannii: NOT DETECTED
CANDIDA GLABRATA: NOT DETECTED
CANDIDA KRUSEI: NOT DETECTED
CANDIDA PARAPSILOSIS: NOT DETECTED
Candida albicans: NOT DETECTED
Candida tropicalis: NOT DETECTED
ESCHERICHIA COLI: NOT DETECTED
Enterobacter cloacae complex: NOT DETECTED
Enterobacteriaceae species: NOT DETECTED
Enterococcus species: NOT DETECTED
Haemophilus influenzae: NOT DETECTED
KLEBSIELLA PNEUMONIAE: NOT DETECTED
Klebsiella oxytoca: NOT DETECTED
Listeria monocytogenes: NOT DETECTED
Methicillin resistance: DETECTED — AB
Neisseria meningitidis: NOT DETECTED
PROTEUS SPECIES: NOT DETECTED
Pseudomonas aeruginosa: NOT DETECTED
SERRATIA MARCESCENS: NOT DETECTED
STAPHYLOCOCCUS AUREUS BCID: NOT DETECTED
STAPHYLOCOCCUS SPECIES: DETECTED — AB
Streptococcus agalactiae: NOT DETECTED
Streptococcus pneumoniae: NOT DETECTED
Streptococcus pyogenes: NOT DETECTED
Streptococcus species: NOT DETECTED

## 2017-05-07 LAB — COMPREHENSIVE METABOLIC PANEL
ALT: 15 U/L (ref 14–54)
AST: 33 U/L (ref 15–41)
Albumin: 2.3 g/dL — ABNORMAL LOW (ref 3.5–5.0)
Alkaline Phosphatase: 52 U/L (ref 38–126)
Anion gap: 10 (ref 5–15)
BILIRUBIN TOTAL: 0.6 mg/dL (ref 0.3–1.2)
BUN: 60 mg/dL — AB (ref 6–20)
CHLORIDE: 128 mmol/L — AB (ref 101–111)
CO2: 20 mmol/L — ABNORMAL LOW (ref 22–32)
Calcium: 7.4 mg/dL — ABNORMAL LOW (ref 8.9–10.3)
Creatinine, Ser: 2.1 mg/dL — ABNORMAL HIGH (ref 0.44–1.00)
GFR, EST AFRICAN AMERICAN: 30 mL/min — AB (ref >60)
GFR, EST NON AFRICAN AMERICAN: 26 mL/min — AB (ref >60)
Glucose, Bld: 109 mg/dL — ABNORMAL HIGH (ref 65–99)
POTASSIUM: 4.7 mmol/L (ref 3.5–5.1)
Sodium: 158 mmol/L — ABNORMAL HIGH (ref 135–145)
TOTAL PROTEIN: 5.3 g/dL — AB (ref 6.5–8.1)

## 2017-05-07 LAB — PROTIME-INR
INR: 1.64
Prothrombin Time: 19.3 seconds — ABNORMAL HIGH (ref 11.4–15.2)

## 2017-05-07 LAB — CBG MONITORING, ED
GLUCOSE-CAPILLARY: 101 mg/dL — AB (ref 65–99)
GLUCOSE-CAPILLARY: 109 mg/dL — AB (ref 65–99)
GLUCOSE-CAPILLARY: 112 mg/dL — AB (ref 65–99)
GLUCOSE-CAPILLARY: 131 mg/dL — AB (ref 65–99)
Glucose-Capillary: 106 mg/dL — ABNORMAL HIGH (ref 65–99)

## 2017-05-07 LAB — SODIUM, URINE, RANDOM: Sodium, Ur: 26 mmol/L

## 2017-05-07 LAB — PROCALCITONIN: PROCALCITONIN: 6.18 ng/mL

## 2017-05-07 LAB — APTT: APTT: 38 s — AB (ref 24–36)

## 2017-05-07 LAB — LACTIC ACID, PLASMA: LACTIC ACID, VENOUS: 1.3 mmol/L (ref 0.5–1.9)

## 2017-05-07 LAB — CREATININE, URINE, RANDOM: Creatinine, Urine: 238.73 mg/dL

## 2017-05-07 MED ORDER — FLUCONAZOLE 100MG IVPB
100.0000 mg | INTRAVENOUS | Status: DC
  Administered 2017-05-07 – 2017-05-10 (×4): 100 mg via INTRAVENOUS
  Filled 2017-05-07 (×6): qty 50

## 2017-05-07 MED ORDER — DIATRIZOATE MEGLUMINE & SODIUM 66-10 % PO SOLN
90.0000 mL | Freq: Once | ORAL | Status: AC
  Administered 2017-05-07: 90 mL via NASOGASTRIC
  Filled 2017-05-07: qty 90

## 2017-05-07 MED ORDER — PIPERACILLIN-TAZOBACTAM IN DEX 2-0.25 GM/50ML IV SOLN
2.2500 g | Freq: Three times a day (TID) | INTRAVENOUS | Status: DC
Start: 2017-05-07 — End: 2017-05-08
  Administered 2017-05-07 – 2017-05-08 (×4): 2.25 g via INTRAVENOUS
  Filled 2017-05-07 (×5): qty 50

## 2017-05-07 MED ORDER — SODIUM CHLORIDE 0.9 % IV SOLN
INTRAVENOUS | Status: DC
  Administered 2017-05-07: 02:00:00 via INTRAVENOUS

## 2017-05-07 MED ORDER — DEXTROSE 5 % IV SOLN
INTRAVENOUS | Status: DC
  Administered 2017-05-07 (×2): via INTRAVENOUS
  Administered 2017-05-08: 1000 mL via INTRAVENOUS
  Administered 2017-05-08 – 2017-05-09 (×2): via INTRAVENOUS

## 2017-05-07 MED ORDER — BISACODYL 10 MG RE SUPP
10.0000 mg | Freq: Once | RECTAL | Status: AC
  Administered 2017-05-07: 10 mg via RECTAL
  Filled 2017-05-07: qty 1

## 2017-05-07 MED ORDER — PANTOPRAZOLE SODIUM 40 MG IV SOLR
40.0000 mg | INTRAVENOUS | Status: DC
  Administered 2017-05-07 – 2017-05-09 (×3): 40 mg via INTRAVENOUS
  Filled 2017-05-07 (×3): qty 40

## 2017-05-07 MED ORDER — VANCOMYCIN HCL IN DEXTROSE 750-5 MG/150ML-% IV SOLN
750.0000 mg | INTRAVENOUS | Status: DC
  Administered 2017-05-08: 750 mg via INTRAVENOUS
  Filled 2017-05-07: qty 150

## 2017-05-07 NOTE — Consult Note (Signed)
[facility] Surgery Consult Note  THANIA WOODLIEF February 10, 1965  [id number].    Requesting MD: Dr. 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 The patient does not speak, only grinds her teeth, so history is obtained from her son.  He does not know a lot about her history.  She apparently was noted to have a change in her respiratory status, hence her ED visit.  Upon arrival she was noted to have aspiration PNA and has been admitted for this.  The son states that she is being fed only through her g-tube and not orally.  Apparently at some point in the ED the patient complained of some abdominal pain and a CT scan was obtained that questions a SBO.  The patient nor son know if she is passing flatus or when her last BM occurred.   We have been asked to evaluate her for further recommendations.  ROS: ROS  All systems reviewed and otherwise negative except for as above.  Limited ROS able to be obtained as patient can not fully communicate.   Family History  Problem Relation Age of Onset  . Hypertension Other     Past Medical History:  Diagnosis Date  . Acute pulmonary embolism ([place]) 02/19/2015  . Acute respiratory failure ([place])   . Diabetes mellitus without complication (HCC)    Type 2, W/o complications  . DVT (deep venous thrombosis) ([place]) 04/05/2015  . Dysphagia   . Epilepsy ([place])   . GERD (gastroesophageal reflux disease)   . Hyperlipidemia   . Hypertension   . IBS (irritable bowel syndrome)   . Nontraumatic subarachnoid hemorrhage ([place])   . SAH (subarachnoid hemorrhage) ([place])   . Urinary retention     Past Surgical History:  Procedure Laterality Date  . ABDOMINAL SURGERY    . ANEURYSM COILING    . COLONOSCOPY N/A 02/20/2015    Procedure: COLONOSCOPY;  Surgeon: Gatha Mayer, MD;  Location: [place];  Service: Endoscopy;  Laterality: N/A;  . ESOPHAGOGASTRODUODENOSCOPY (EGD) WITH PROPOFOL N/A 02/02/2015   Procedure: ESOPHAGOGASTRODUODENOSCOPY (EGD) WITH PROPOFOL;  Surgeon: Judeth Horn, MD;  Location: [place];  Service: General;  Laterality: N/A;  . IR GENERIC HISTORICAL  08/26/2015   IR GASTRIC TUBE PERC CHG W/O IMG GUIDE 08/26/2015 Aletta Edouard, MD [facility]  . IR GENERIC HISTORICAL  09/14/2015   IR REPLC GASTRO/COLONIC TUBE PERCUT W/FLUORO 09/14/2015 Darrell K Allred, PA-C [facility]  . IR REPLACE G-TUBE SIMPLE WO FLUORO  06/08/2016  . IR REPLACE G-TUBE SIMPLE WO FLUORO  10/24/2016  . IR REPLACE G-TUBE SIMPLE WO FLUORO  01/07/2017  . PEG PLACEMENT N/A 02/02/2015   Procedure: PERCUTANEOUS ENDOSCOPIC GASTROSTOMY (PEG) PLACEMENT;  Surgeon: Judeth Horn, MD;  Location: [place];  Service: General;  Laterality: N/A;  . RADIOLOGY WITH ANESTHESIA N/A 01/13/2015   Procedure: RADIOLOGY WITH ANESTHESIA;  Surgeon: Consuella Lose, MD;  Location: [place];  Service: Radiology;  Laterality: N/A;  . TRACHEOSTOMY      Social History:  reports that she has quit smoking. She has never used smokeless tobacco. She reports that she does not drink alcohol or use drugs.  Allergies: No Known Allergies   (Not in a hospital admission)  Prior to Admission medications   Medication Sig Start Date  End Date Taking? Authorizing Provider  acetaminophen (TYLENOL) 325 MG tablet Place 650 mg into feeding tube every 4 (four) hours as needed for mild pain or fever.    Yes [provider]  bethanechol (URECHOLINE) 10 MG tablet Place 10 mg into feeding tube 3 (three) times daily.    Yes [provider]  enoxaparin (LOVENOX) 60 MG/0.6ML injection Inject 0.6 mLs (60 mg total) into the skin 2 (two) times daily. 04/22/17 05/22/17 Yes Gerlene Fee, NP  gabapentin (NEURONTIN) 100 MG capsule Take 200 mg by mouth 2 (two)  times daily. HOLD IF LETHARGIC 04/04/17  Yes Almyra Free, MD  glycopyrrolate (ROBINUL) 1 MG tablet Take 1 mg by mouth 2 (two) times daily.  04/03/17  Yes [provider]  levETIRAcetam (KEPPRA) 100 MG/ML solution Place 5 mLs (500 mg total) into feeding tube 2 (two) times daily. 02/11/15  Yes Thurnell Lose, MD  loperamide (IMODIUM A-D) 2 MG tablet Take 1 tablet (2 mg total) by mouth 4 (four) times daily as needed for diarrhea or loose stools. Patient taking differently: Place 2 mg into feeding tube every 6 (six) hours as needed (for diarrhea).  02/24/17  Yes Debbe Odea, MD  Multiple Vitamins-Minerals (MULTIVITAMIN WITH MINERALS) tablet Place 1 tablet into feeding tube daily.   Yes [provider]  ondansetron (ZOFRAN) 4 MG tablet Place 4 mg into feeding tube daily.    Yes [provider]  polyethylene glycol powder (GLYCOLAX/MIRALAX) powder Place 17 g into feeding tube daily.    Yes [provider]  Probiotic Product (PROBIOTIC DAILY) CAPS Place 1 capsule into feeding tube 2 (two) times daily. FOR 82 DAYS FOR GINGIVITIS 04/03/17 06/24/17 Yes [provider]  ranitidine (ZANTAC) 150 MG tablet Take 150 mg by mouth daily.  04/25/17  Yes [provider]  rosuvastatin (CRESTOR) 20 MG tablet Place 20 mg into feeding tube at bedtime.  04/25/17  Yes [provider]  tetracycline (ACHROMYCIN,SUMYCIN) 250 MG capsule Place 250 mg into feeding tube 2 (two) times daily. Related to chronic gingivitis, plaque induced for 60 days.    Yes [provider]  traZODone (DESYREL) 50 MG tablet Take 1 tablet (50 mg total) by mouth at bedtime. Patient taking differently: Place 50 mg into feeding tube at bedtime.  02/24/17  Yes Debbe Odea, MD  Amino Acids-Protein Hydrolys (FEEDING SUPPLEMENT, PRO-STAT SUGAR FREE 64,) LIQD Place 60 mLs into feeding tube 3 (three) times daily with meals. Patient not taking: Reported on 05/06/2017 02/22/17   Debbe Odea, MD   Multiple Vitamins-Minerals (DECUBI-VITE) CAPS Place 1 capsule into feeding tube daily.     [provider]  Neomycin-Bacitracin-Polymyxin (NEOMYCIN-POLYMYXIN-BACITRACIN EX) Apply topically daily to right posterior heel and cover with dry dressing 04/24/17   [provider]  Nutritional Supplements (FEEDING SUPPLEMENT, JEVITY 1.5 CAL/FIBER,) LIQD Place 1,000 mLs into feeding tube continuous. Patient not taking: Reported on 05/06/2017 02/22/17   Debbe Odea, MD  oxyCODONE (ROXICODONE) 5 MG immediate release tablet Place 1 tablet (5 mg total) into feeding tube every 4 (four) hours as needed for severe pain. Patient not taking: Reported on 05/06/2017 02/24/17   Debbe Odea, MD  WATER FOR INJECTION STERILE IJ Place 30-60 mLs into feeding tube. Flush with 30-60 ml water before and after meds, before initiating feedings or when there is an interruption of feeding to maintain patency.  Also flush every shift with 5-10 ml water between each medication    [provider]  Water For Irrigation,  Sterile (FREE WATER) SOLN Place 200 mLs into feeding tube 4 (four) times daily. Patient not taking: Reported on 05/06/2017 02/22/17   Debbe Odea, MD    Blood pressure 107/73, pulse (!) 102, temperature 97.9 F (36.6 C), temperature source Rectal, resp. rate (!) 23, height 5' (1.524 m), weight 125 lb (56.7 kg), SpO2 97 %. Physical Exam: General: chronically ill appearing black female who is laying in bed in NAD HEENT: head is normocephalic, atraumatic.  Sclera are noninjected.  PERRL.  Ears and nose without any masses or lesions.  Tongue is white.  Constantly grinds her teeth. Heart: regular, rate, and rhythm.  Normal s1,s2. No obvious murmurs, gallops, or rubs noted.  Palpable radial pulses bilaterally Lungs: CTAB, no wheezes, rhonchi, or rales noted.  Respiratory effort nonlabored.  Slight decrease BS at bases Abd: soft, NT, ND, +BS, no masses, hernias, or organomegaly.  g-tube in place and  clamped off. No evidence of drainage or infection. MS: all 4 extremities are symmetrical with no cyanosis, clubbing, or edema.  She does have Prevlon type boots on both LEs.  All extremities are weak. Skin: warm and dry with no masses, lesions, or rashes Psych: orientation unable to be examined as patient does not communicate well.   Results for orders placed or performed during the hospital encounter of 05/06/17 (from the past 48 hour(s))  Basic metabolic panel     Status: Abnormal   Collection Time: 05/06/17  8:23 PM  Result Value Ref Range   Sodium 154 (H) 135 - 145 mmol/L   Potassium 3.5 3.5 - 5.1 mmol/L   Chloride 123 (H) 101 - 111 mmol/L   CO2 21 (L) 22 - 32 mmol/L   Glucose, Bld 251 (H) 65 - 99 mg/dL   BUN 68 (H) 6 - 20 mg/dL   Creatinine, Ser 2.68 (H) 0.44 - 1.00 mg/dL   Calcium 9.1 8.9 - 10.3 mg/dL   GFR calc non Af Amer 19 (L) >60 mL/min   GFR calc Af Amer 22 (L) >60 mL/min    Comment: (NOTE) The eGFR has been calculated using the CKD EPI equation. This calculation has not been validated in all clinical situations. eGFR's persistently <60 mL/min signify possible Chronic Kidney Disease.    Anion gap 10 5 - 15    Comment: Performed at [facility] [address], [place], [place] [address]  CBC with Differential     Status: Abnormal   Collection Time: 05/06/17  8:23 PM  Result Value Ref Range   WBC 16.7 (H) 4.0 - 10.5 K/uL   RBC 4.95 3.87 - 5.11 MIL/uL   Hemoglobin 14.5 12.0 - 15.0 g/dL   HCT 47.9 (H) 36.0 - 46.0 %   MCV 96.8 78.0 - 100.0 fL   MCH 29.3 26.0 - 34.0 pg   MCHC 30.3 30.0 - 36.0 g/dL   RDW 15.5 11.5 - 15.5 %   Platelets 306 150 - 400 K/uL   Neutrophils Relative % 82 %   Neutro Abs 13.7 (H) 1.7 - 7.7 K/uL   Lymphocytes Relative 9 %   Lymphs Abs 1.5 0.7 - 4.0 K/uL   Monocytes Relative 9 %   Monocytes Absolute 1.5 (H) 0.1 - 1.0 K/uL   Eosinophils Relative 0 %   Eosinophils Absolute 0.0 0.0 - 0.7 K/uL   Basophils Relative 0 %   Basophils  Absolute 0.0 0.0 - 0.1 K/uL    Comment: Performed at [facility] [place],  [place] [address]  Brain natriuretic peptide     Status: None   Collection Time: 05/06/17  8:23 PM  Result Value Ref Range   B Natriuretic Peptide 53.0 0.0 - 100.0 pg/mL    Comment: Performed at [facility] [address], [place], [place] [address]  Urinalysis, Routine w reflex microscopic     Status: Abnormal   Collection Time: 05/06/17  8:24 PM  Result Value Ref Range   Color, Urine AMBER (A) YELLOW    Comment: BIOCHEMICALS MAY BE AFFECTED BY COLOR   APPearance CLOUDY (A) CLEAR   Specific Gravity, Urine 1.026 1.005 - 1.030   pH 5.0 5.0 - 8.0   Glucose, UA 50 (A) NEGATIVE mg/dL   Hgb urine dipstick SMALL (A) NEGATIVE   Bilirubin Urine NEGATIVE NEGATIVE   Ketones, ur 5 (A) NEGATIVE mg/dL   Protein, ur >=300 (A) NEGATIVE mg/dL   Nitrite NEGATIVE NEGATIVE   Leukocytes, UA NEGATIVE NEGATIVE   RBC / HPF 6-30 0 - 5 RBC/hpf   WBC, UA 6-30 0 - 5 WBC/hpf   Bacteria, UA RARE (A) NONE SEEN   Squamous Epithelial / LPF 0-5 (A) NONE SEEN   Mucus PRESENT    Hyaline Casts, UA PRESENT    Non Squamous Epithelial 0-5 (A) NONE SEEN    Comment: Performed at [facility], [place] [address], [place], [place] [address]  Blood culture (routine x 2)     Status: None (Preliminary result)   Collection Time: 05/06/17  8:45 PM  Result Value Ref Range   Specimen Description BLOOD LEFT ARM    Special Requests      BOTTLES DRAWN AEROBIC AND ANAEROBIC Blood Culture adequate volume   Culture      NO GROWTH < 24 HOURS Performed at [facility], [place] [address], [place], [place] [address]    Report Status PENDING   Blood culture (routine x 2)     Status: None (Preliminary result)   Collection Time: 05/06/17  8:58 PM  Result Value Ref Range   Specimen Description BLOOD RIGHT HAND    Special Requests      BOTTLES DRAWN AEROBIC ONLY Blood Culture results may not be optimal due to an inadequate  volume of blood received in culture bottles   Culture      NO GROWTH < 24 HOURS Performed at [facility] [address] [place], [place] [address]    Report Status PENDING   I-stat troponin, ED     Status: Abnormal   Collection Time: 05/06/17  9:01 PM  Result Value Ref Range   Troponin i, poc 0.21 (HH) 0.00 - 0.08 ng/mL   Comment NOTIFIED PHYSICIAN    Comment 3            Comment: Due to the release kinetics of cTnI, a negative result within the first hours of the onset of symptoms does not rule out myocardial infarction with certainty. If myocardial infarction is still suspected, repeat the test at appropriate intervals.   I-Stat CG4 Lactic Acid, ED     Status: Abnormal   Collection Time: 05/06/17  9:02 PM  Result Value Ref Range   Lactic Acid, Venous 3.27 (HH) 0.5 - 1.9 mmol/L  CBG monitoring, ED     Status: Abnormal   Collection Time: 05/06/17  9:03 PM  Result Value Ref Range   Glucose-Capillary 232 (H) 65 - 99 mg/dL  Sodium, urine, random     Status: None  Collection Time: 05/06/17  9:21 PM  Result Value Ref Range   Sodium, Ur 26 mmol/L    Comment: Performed at [facility] [address] [place], [place] [address]  Creatinine, urine, random     Status: None   Collection Time: 05/06/17  9:21 PM  Result Value Ref Range   Creatinine, Urine 238.73 mg/dL    Comment: Performed at [facility] [address] [place], [place] [address]  I-Stat CG4 Lactic Acid, ED     Status: None   Collection Time: 05/06/17 11:32 PM  Result Value Ref Range   Lactic Acid, Venous 0.87 0.5 - 1.9 mmol/L  CBG monitoring, ED     Status: Abnormal   Collection Time: 05/07/17 12:38 AM  Result Value Ref Range   Glucose-Capillary 112 (H) 65 - 99 mg/dL  Comprehensive metabolic panel     Status: Abnormal   Collection Time: 05/07/17 12:41 AM  Result Value Ref Range   Sodium 158 (H) 135 - 145 mmol/L   Potassium 4.7 3.5 - 5.1 mmol/L    Comment: DELTA CHECK NOTED HEMOLYSIS AT  THIS LEVEL MAY AFFECT RESULT    Chloride 128 (H) 101 - 111 mmol/L   CO2 20 (L) 22 - 32 mmol/L   Glucose, Bld 109 (H) 65 - 99 mg/dL   BUN 60 (H) 6 - 20 mg/dL   Creatinine, Ser 2.10 (H) 0.44 - 1.00 mg/dL   Calcium 7.4 (L) 8.9 - 10.3 mg/dL   Total Protein 5.3 (L) 6.5 - 8.1 g/dL   Albumin 2.3 (L) 3.5 - 5.0 g/dL   AST 33 15 - 41 U/L   ALT 15 14 - 54 U/L   Alkaline Phosphatase 52 38 - 126 U/L   Total Bilirubin 0.6 0.3 - 1.2 mg/dL   GFR calc non Af Amer 26 (L) >60 mL/min   GFR calc Af Amer 30 (L) >60 mL/min    Comment: (NOTE) The eGFR has been calculated using the CKD EPI equation. This calculation has not been validated in all clinical situations. eGFR's persistently <60 mL/min signify possible Chronic Kidney Disease.    Anion gap 10 5 - 15    Comment: Performed at [facility] [address] [place], [place] [address]  Lactic acid, plasma     Status: None   Collection Time: 05/07/17 12:41 AM  Result Value Ref Range   Lactic Acid, Venous 1.3 0.5 - 1.9 mmol/L    Comment: Performed at [facility] [address], [place], [place] [address]  Procalcitonin     Status: None   Collection Time: 05/07/17 12:41 AM  Result Value Ref Range   Procalcitonin 6.18 ng/mL    Comment:        Interpretation: PCT > 2 ng/mL: Systemic infection (sepsis) is likely, unless other causes are known. (NOTE)       Sepsis PCT Algorithm           Lower Respiratory Tract                                      Infection PCT Algorithm    ----------------------------     ----------------------------         PCT < 0.25 ng/mL                PCT < 0.10 ng/mL  Strongly encourage             Strongly discourage   discontinuation of antibiotics    initiation of antibiotics    ----------------------------     -----------------------------       PCT 0.25 - 0.50 ng/mL            PCT 0.10 - 0.25 ng/mL               OR       >80% decrease in PCT            Discourage initiation of                                             antibiotics      Encourage discontinuation           of antibiotics    ----------------------------     -----------------------------         PCT >= 0.50 ng/mL              PCT 0.26 - 0.50 ng/mL               AND       <80% decrease in PCT              Encourage initiation of                                             antibiotics       Encourage continuation           of antibiotics    ----------------------------     -----------------------------        PCT >= 0.50 ng/mL                  PCT > 0.50 ng/mL               AND         increase in PCT                  Strongly encourage                                      initiation of antibiotics    Strongly encourage escalation           of antibiotics                                     -----------------------------                                           PCT <= 0.25 ng/mL                                                 OR                                        >  80% decrease in PCT                                     Discontinue / Do not initiate                                             antibiotics Performed at [facility], [place] [address] [place], [place] [address]   Protime-INR     Status: Abnormal   Collection Time: 05/07/17 12:41 AM  Result Value Ref Range   Prothrombin Time 19.3 (H) 11.4 - 15.2 seconds   INR 1.64     Comment: Performed at [facility] [address] [place], [place] [address]  APTT     Status: Abnormal   Collection Time: 05/07/17 12:41 AM  Result Value Ref Range   aPTT 38 (H) 24 - 36 seconds    Comment:        IF BASELINE aPTT IS ELEVATED, SUGGEST PATIENT RISK ASSESSMENT BE USED TO DETERMINE APPROPRIATE ANTICOAGULANT THERAPY. Performed at [facility], [place] [address], [place], [place] [address]   Troponin I (q 6hr x 3)     Status: Abnormal   Collection Time: 05/07/17 12:41 AM  Result Value Ref Range   Troponin I 0.20 (HH) <0.03 ng/mL    Comment:  CRITICAL RESULT CALLED TO, READ BACK BY AND VERIFIED WITH: MORRIS,T RN 05/07/2017 0131 JORDANS Performed at [facility], [place] [address] [place], [place] [address]   Troponin I (q 6hr x 3)     Status: Abnormal   Collection Time: 05/07/17  6:05 AM  Result Value Ref Range   Troponin I 0.14 (HH) <0.03 ng/mL    Comment: CRITICAL RESULT CALLED TO, READ BACK BY AND VERIFIED WITH: HAYDEN MORRISON,RN AT 2423 05/07/17 BY ZBEECH. Performed at [facility], [place] [address] [place], [place] [address]   CBG monitoring, ED     Status: Abnormal   Collection Time: 05/07/17  7:22 AM  Result Value Ref Range   Glucose-Capillary 101 (H) 65 - 99 mg/dL  CBG monitoring, ED     Status: Abnormal   Collection Time: 05/07/17 11:32 AM  Result Value Ref Range   Glucose-Capillary 131 (H) 65 - 99 mg/dL  Troponin I (q 6hr x 3)     Status: Abnormal   Collection Time: 05/07/17 12:06 PM  Result Value Ref Range   Troponin I 0.06 (HH) <0.03 ng/mL    Comment: CRITICAL VALUE NOTED.  VALUE IS CONSISTENT WITH PREVIOUSLY REPORTED AND CALLED VALUE. Performed at [facility], [place] [address], [place], [place] [address]    Ct Abdomen Pelvis Wo Contrast  Result Date: 05/07/2017 CLINICAL DATA:  Abdominal distension EXAM: CT ABDOMEN AND PELVIS WITHOUT CONTRAST TECHNIQUE: Multidetector CT imaging of the abdomen and pelvis was performed following the standard protocol without IV contrast. COMPARISON:  CT abdomen pelvis 02/22/2017, CT chest 05/06/2017 FINDINGS: Lower chest: Lung bases demonstrate trace right pleural effusion with development of patchy consolidation at the right lung base. Trace pericardial effusion. Hepatobiliary: No focal hepatic abnormality. Layering sludge or small stones in the gallbladder. No biliary dilatation Pancreas: Unremarkable. No pancreatic ductal dilatation or surrounding inflammatory changes. Spleen: Normal in size without focal abnormality. Adrenals/Urinary Tract: Adrenal glands are  within  normal limits. No hydronephrosis. Patchy hyperdensity within the right renal cortex with small amount of excreted contrast in the collecting systems. Residual contrast within the urinary bladder. Stomach/Bowel: Gastrostomy tube in the body of the stomach. Stomach is decompressed. Dilated segment of small bowel in the right abdomen measuring up to 4.6 cm. Abrupt transition two decompressed caliber small bowel in the central pelvis, consistent with bowel obstruction. Moderate retained feces in the rectum. Mild colon diverticular disease. Small amount of radiopaque material within the distal colon. Vascular/Lymphatic: Moderate aortic atherosclerosis. No aneurysmal dilatation. No significantly enlarged lymph nodes. Reproductive: Status post hysterectomy. No adnexal masses. Other: Negative for free air or significant free fluid. Small hyperdense foci and gas within the subcutaneous fat of the abdominal wall anteriorly, possibly due to subcutaneous injections. Musculoskeletal: No acute or suspicious abnormality. IMPRESSION: 1. Focally dilated segment of small bowel within the right abdomen with abrupt transition to decompressed distal small bowel in the central upper pelvis with decompressed distal small bowel and colon; overall pattern favors a mechanical small bowel obstruction. Given the focal appearance and right-sided distribution of the dilated small bowel, could consider closed loop obstruction although no definite mesenteric swirling or mesenteric vascular crowding is identified. Negative for intramural air or free air. 2. Development of trace right pleural effusion with consolidation at the right base which may reflect atelectasis or pneumonia 3. Patchy increased cortical density in the right kidney, suspect for delayed nephrogram as may be seen with compromised renal function. 4. Layering stones or sludge within the gallbladder. Electronically Signed   By: Donavan Foil M.D.   On: 05/07/2017 15:24   Dg Abd 1  View  Result Date: 05/07/2017 CLINICAL DATA:  In communicative patient, severe abdominal pain and tightness, vomiting, urinary retention EXAM: ABDOMEN - 1 VIEW COMPARISON:  CT abdomen pelvis of 02/22/2017 FINDINGS: Compared to the prior CT of the abdomen pelvis, there are dilated loops of small bowel present and very little colonic bowel gas is seen. Although this could indicate ileus, partial small bowel obstruction is a definite consideration. Gastrostomy tube again is noted. Free air cannot be evaluated on the supine images. No opaque calculi are seen. Some contrast is noted within the urinary bladder. The bones are osteopenic. IMPRESSION: 1. Dilated loops of small bowel with very little colonic bowel gas. Possible ileus but cannot exclude partial small bowel obstruction. CT the abdomen pelvis may be helpful if warranted. 2. Gastrostomy tube is present. 3. Osteopenia. Electronically Signed   By: Ivar Drape M.D.   On: 05/07/2017 11:22   Ct Angio Chest Pe W And/or Wo Contrast  Result Date: 05/06/2017 CLINICAL DATA:  52 y/o  F; altered mental status and tachycardia. EXAM: CT ANGIOGRAPHY CHEST WITH CONTRAST TECHNIQUE: Multidetector CT imaging of the chest was performed using the standard protocol during bolus administration of intravenous contrast. Multiplanar CT image reconstructions and MIPs were obtained to evaluate the vascular anatomy. CONTRAST:  144m ISOVUE-370 IOPAMIDOL (ISOVUE-370) INJECTION 76% COMPARISON:  02/17/2015 CT angiogram chest. FINDINGS: Cardiovascular: Normal heart size. No pericardial effusion. Normal caliber thoracic aorta with mild calcific atherosclerosis. Moderate coronary artery calcification. Normal caliber main pulmonary artery. Satisfactory opacification of the pulmonary arteries. Mediastinum/Nodes: No enlarged mediastinal, hilar, or axillary lymph nodes. Thyroid gland, trachea, and esophagus demonstrate no acute findings. Mildly patulous esophagus. Lungs/Pleura: Diffuse  peribronchial thickening with debris in the mainstem bronchi extending predominantly into the lower lobes bilaterally. No consolidation, effusion, or pneumothorax. Upper Abdomen: No acute abnormality. Musculoskeletal: No chest wall  abnormality. No acute or significant osseous findings. Review of the MIP images confirms the above findings. IMPRESSION: 1. No pulmonary embolus identified. 2. Diffuse peribronchial thickening with debris in the mainstem bronchi and extending into the lower lobes. Findings may represent acute bronchitis or aspiration given the distribution. No consolidation. 3. Mildly patulous esophagus. 4. Aorta and coronary artery calcific atherosclerosis. Electronically Signed   By: Kristine Garbe M.D.   On: 05/06/2017 22:46   Dg Chest Port 1 View  Result Date: 05/06/2017 CLINICAL DATA:  Shortness of breath. EXAM: PORTABLE CHEST 1 VIEW COMPARISON:  Radiograph February 20, 2017. FINDINGS: The heart size and mediastinal contours are within normal limits. Both lungs are clear. No pneumothorax or pleural effusion is noted. The visualized skeletal structures are unremarkable. IMPRESSION: No acute cardiopulmonary abnormality seen. Electronically Signed   By: Marijo Conception, M.D.   On: 05/06/2017 20:49   Anti-infectives (From admission, onward)   Start     Dose/Rate Route Frequency Ordered Stop   05/08/17 0100  vancomycin (VANCOCIN) IVPB 750 mg/150 ml premix     750 mg 150 mL/hr over 60 Minutes Intravenous Every 24 hours 05/07/17 0014     05/07/17 1130  fluconazole (DIFLUCAN) IVPB 100 mg     100 mg 50 mL/hr over 60 Minutes Intravenous Every 24 hours 05/07/17 1006     05/07/17 0800  piperacillin-tazobactam (ZOSYN) IVPB 2.25 g     2.25 g 100 mL/hr over 30 Minutes Intravenous Every 8 hours 05/07/17 0014     05/06/17 2345  piperacillin-tazobactam (ZOSYN) IVPB 3.375 g     3.375 g 100 mL/hr over 30 Minutes Intravenous  Once 05/06/17 2342 05/07/17 0140   05/06/17 2345  vancomycin  (VANCOCIN) IVPB 1000 mg/200 mL premix     1,000 mg 200 mL/hr over 60 Minutes Intravenous  Once 05/06/17 2342 05/07/17 0141   05/06/17 2115  cefTRIAXone (ROCEPHIN) 1 g in sodium chloride 0.9 % 100 mL IVPB     1 g 200 mL/hr over 30 Minutes Intravenous  Once 05/06/17 2108 05/06/17 2209   05/06/17 2115  azithromycin (ZITHROMAX) 500 mg in sodium chloride 0.9 % 250 mL IVPB     500 mg 250 mL/hr over 60 Minutes Intravenous  Once 05/06/17 2108 05/06/17 2315        Assessment/Plan H/o CVA with hemiparesis Wheelchair bound DM HLD Seizure disorder 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  The patient has not had any nausea or vomiting which suggests she does not have a high grade obstruction or this is early, although her loops of SB are dilated.  Either way, her g-tube will be put to gravity for decompression and we will order the SBO protocol.  We did discuss the possibility of surgery, but expressed that many of these SBOs do get better with conservative management.  The son understood. -she does have a large stool burden in her rectum.  Will order a suppository.  ID - Vanc/Zosyn for asp PNA VTE - Lovenox FEN - NPO/ g-tube to gravity Foley - [place]  to medicine . NPO, bowel rest, IVF, pain control, antiemetics, g-tube to gravity and follow SBO protocol.  We will follow the patient with you.  Henreitta Cea 4:38 PM 05/07/2017

## 2017-05-07 NOTE — ED Notes (Signed)
Attempted to reposition pt to her left side, she did not tolerate the turning part.

## 2017-05-07 NOTE — ED Notes (Signed)
Pt care assumed, obtained verbal report.  Family is at bedside.  Pt is alert and oriented.  She denies any complaints at this time.  Reason for the delay explained to pt and family, they verbalize understanding.

## 2017-05-07 NOTE — ED Notes (Signed)
XRAY made aware contrast is given, [facility]

## 2017-05-07 NOTE — ED Notes (Signed)
Still waiting for CT contrast from pharmacy.  Sent missing dose msg about 15-20 minutes ago.  Ben Pharmacist made aware and will check on it.

## 2017-05-07 NOTE — Progress Notes (Signed)
PHARMACY - PHYSICIAN COMMUNICATION CRITICAL VALUE ALERT - BLOOD CULTURE IDENTIFICATION (BCID)  Rachel Vang is an 52 y.o. female [facility] with a chief complaint of tachycardia and tachypnea  Assessment: BCx growing 2/2 GPC. BCID with CONS (methicillin resistance detected). Currently on vancomycin/zosyn for sepsis suspected secondary from pulmonary source.  Name of physician (or Provider) Contacted: Text-paged Donnamarie PoagK. Kirby  Current antibiotics: vancomycin/zosyn  Changes to prescribed antibiotics recommended: none at this time  Results for orders placed or performed during the hospital encounter of 05/06/17  Blood Culture ID Panel (Reflexed) (Collected: 05/06/2017  8:45 PM)  Result Value Ref Range   Enterococcus species NOT DETECTED NOT DETECTED   Listeria monocytogenes NOT DETECTED NOT DETECTED   Staphylococcus species DETECTED (A) NOT DETECTED   Staphylococcus aureus NOT DETECTED NOT DETECTED   Methicillin resistance DETECTED (A) NOT DETECTED   Streptococcus species NOT DETECTED NOT DETECTED   Streptococcus agalactiae NOT DETECTED NOT DETECTED   Streptococcus pneumoniae NOT DETECTED NOT DETECTED   Streptococcus pyogenes NOT DETECTED NOT DETECTED   Acinetobacter baumannii NOT DETECTED NOT DETECTED   Enterobacteriaceae species NOT DETECTED NOT DETECTED   Enterobacter cloacae complex NOT DETECTED NOT DETECTED   Escherichia coli NOT DETECTED NOT DETECTED   Klebsiella oxytoca NOT DETECTED NOT DETECTED   Klebsiella pneumoniae NOT DETECTED NOT DETECTED   Proteus species NOT DETECTED NOT DETECTED   Serratia marcescens NOT DETECTED NOT DETECTED   Haemophilus influenzae NOT DETECTED NOT DETECTED   Neisseria meningitidis NOT DETECTED NOT DETECTED   Pseudomonas aeruginosa NOT DETECTED NOT DETECTED   Candida albicans NOT DETECTED NOT DETECTED   Candida glabrata NOT DETECTED NOT DETECTED   Candida krusei NOT DETECTED NOT DETECTED   Candida parapsilosis NOT  DETECTED NOT DETECTED   Candida tropicalis NOT DETECTED NOT DETECTED   Babs BertinHaley Jas Betten, PharmD, BCPS Clinical Pharmacist 05/07/2017 9:23 PM

## 2017-05-07 NOTE — ED Notes (Signed)
Patient transported to X-ray 

## 2017-05-07 NOTE — ED Notes (Signed)
Dr Katrinka BlazingSmith Hospitalist at bedside

## 2017-05-07 NOTE — Progress Notes (Addendum)
PROGRESS NOTE    Rachel Vang  XWR:[id number] DOB: [birth year] DOA: 05/06/2017 PCP: Kirt Boys, DO    Brief Narrative:  Rachel Vang is a 52 y.o. 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  Patient is reportedly bedbound, nonverbal, and dependent on PEG tube at her baseline.  She was noted by nursing home personnel to be tachypneic and tachycardic today and EMS was called.  Patient was suspected to be in SVT and was treated with adenosine 6 mg, and then 12 mg prior to arrival in the ED.  There was no significant change in heart rate with adenosine and she was also given 500 cc of normal saline.  Patient is unable to contribute to the history due to her clinical condition.  ED Course: Upon arrival to the ED, patient is found to be febrile to 39.9 C, tachycardic to 170, tachypneic, and slightly hypotensive.  EKG features supraventricular tachycardia with rate 171 and chest x-ray is negative for acute cardiopulmonary disease.  Chemistry panel is notable for a sodium of 154, BUN 68, and creatinine 2.68, up from 0.57 in January.  CBC features a leukocytosis to 16,700 and lactic acid is elevated to 3.27.  Troponin is elevated to 0.21.  Urinalysis is unremarkable.  CTA chest is negative for PE, but notable for diffuse peribronchial thickening with debris in the bronchi, concerning for possible acute bronchitis or aspiration.  Blood and urine cultures were collected in the ED, 4.75 L of normal saline was given, and the patient was started on Rocephin and azithromycin.  PCCM was consulted by the ED physician and recommended a medical admission to the stepdown unit.  Heart rate has improved with the IV fluids, MAP remains greater than 65, and the patient will be admitted  to the stepdown unit for ongoing evaluation and management of severe sepsis suspected secondary to a pulmonary source.    Assessment & Plan:   Principal Problem:   Severe sepsis (HCC) Active Problems:   Seizures (HCC)   Elevated troponin   Type II diabetes mellitus with neurological manifestations (HCC)   Essential hypertension, benign   Hemiparesis affecting right side as late effect of stroke (HCC)   Chronic pulmonary embolism (HCC)   AKI (acute kidney injury) (HCC)   Hypernatremia  1-Sepsis; presents with tachycardia, tachypnea, febrile, leukocytosis.  CT angio notable for diffuse peribronchial thickening concerning for bronchitis or aspiration.  Treat for PNA.  Continue with Vancomycin and Zosyn.   Abdominal pain, constipation.  KUB ileus, unable to rule out obstruction.  Check CT abdomen and pelvis . If ct negative for obstruction, she will need enema.  Will hold robunil.  Ct with possible  small bowel mechanical obstruction. Sx consulted.   AKI; SCr is 2.68 on admission, up from 0.57 in January 2019   Suspect related to infection, hypovolemia. Received IV bolus.  Continue with IV fluids.   Hypernatremia; change IV fluids to D 5.  Repeat labs in am.   SVT; suspect related to acute illness, fever.  Resolved with fluids.  Check Mg level   Elevated troponin; Suspect related to demand ischemia, SVT , infection.  Check ECHO.    History of cva; history of seizure; continue with keppra.   Type 2 Diabetes;  SSI.   Seizure disorder; on keppra  History of  DVT, PE; on lovenox.   Oral candidiasis; start IV diflucan.     DVT prophylaxis: lovenox Code Status: full code Family Communication; no family at bedside.  Disposition Plan:  Back to SNF when stable  Consultants:   none   Procedures:  Echo pending.    Antimicrobials:   Vancomycin and zosyn 4-08   Subjective: She is alert, complaints of abdominal pain, constipation.   Objective: Vitals:     05/07/17 0230 05/07/17 0330 05/07/17 0640 05/07/17 0700  BP: 94/69 94/71 91/73  100/73  Pulse: (!) 103 (!) 101 96 (!) 102  Resp: 18 (!) 32 (!) 22 (!) 37  Temp:      TempSrc:      SpO2: 94% 95% 97% 95%  Weight:      Height:        Intake/Output Summary (Last 24 hours) at 05/07/2017 0824 Last data filed at 05/07/2017 0152 Gross per 24 hour  Intake 5600 ml  Output -  Net 5600 ml   Filed Weights   05/06/17 2043  Weight: 56.7 kg (125 lb)    Examination:  General exam: Appears calm and comfortable  Respiratory system: Clear to auscultation. Respiratory effort normal. Cardiovascular system: S1 & S2 heard, RRR. No JVD, murmurs, rubs, gallops or clicks. No pedal edema. Gastrointestinal system: Abdomen is, soft, mild tenderness. . No organomegaly or masses felt.  Central nervous system: Alert, speech soft.  Extremities: Symmetric 5 x 5 power. Skin: No rashes, lesions or ulcers   Data Reviewed: I have personally reviewed following labs and imaging studies  CBC: Recent Labs  Lab 05/06/17 2023  WBC 16.7*  NEUTROABS 13.7*  HGB 14.5  HCT 47.9*  MCV 96.8  PLT 306   Basic Metabolic Panel: Recent Labs  Lab 05/06/17 2023 05/07/17 0041  NA 154* 158*  K 3.5 4.7  CL 123* 128*  CO2 21* 20*  GLUCOSE 251* 109*  BUN 68* 60*  CREATININE 2.68* 2.10*  CALCIUM 9.1 7.4*   GFR: Estimated Creatinine Clearance: 24.2 mL/min (A) (by C-G formula based on SCr of 2.1 mg/dL (H)). Liver Function Tests: Recent Labs  Lab 05/07/17 0041  AST 33  ALT 15  ALKPHOS 52  BILITOT 0.6  PROT 5.3*  ALBUMIN 2.3*   No results for input(s): LIPASE, AMYLASE in the last 168 hours. No results for input(s): AMMONIA in the last 168 hours. Coagulation Profile: Recent Labs  Lab 05/07/17 0041  INR 1.64   Cardiac Enzymes: Recent Labs  Lab 05/07/17 0041 05/07/17 0605  TROPONINI 0.20* 0.14*   BNP (last 3 results) No results for input(s): PROBNP in the last 8760 hours. HbA1C: No results for  input(s): HGBA1C in the last 72 hours. CBG: Recent Labs  Lab 05/06/17 2103 05/07/17 0038 05/07/17 0722  GLUCAP 232* 112* 101*   Lipid Profile: No results for input(s): CHOL, HDL, LDLCALC, TRIG, CHOLHDL, LDLDIRECT in the last 72 hours. Thyroid Function Tests: No results for input(s): TSH, T4TOTAL, FREET4, T3FREE, THYROIDAB in the last 72 hours. Anemia Panel: No results for input(s): VITAMINB12, FOLATE, FERRITIN, TIBC, IRON, RETICCTPCT in the last 72 hours. Sepsis Labs: Recent Labs  Lab 05/06/17 2102 05/06/17 2332 05/07/17 0041  PROCALCITON  --   --  6.18  LATICACIDVEN 3.27* 0.87 1.3    No results found for this or any previous visit (from the past 240 hour(s)).       Radiology Studies: Ct Angio Chest Pe W And/or Wo Contrast  Result Date: 05/06/2017 CLINICAL DATA:  52 y/o  F; altered mental status and tachycardia. EXAM: CT ANGIOGRAPHY CHEST WITH CONTRAST TECHNIQUE: Multidetector CT imaging of the chest was performed using the standard protocol during bolus administration of intravenous contrast. Multiplanar CT image reconstructions and MIPs were obtained to evaluate the vascular anatomy. CONTRAST:  ISOVUE-370 IOPAMIDOL (ISOVUE-370) INJECTION 76% COMPARISON:  02/17/2015 CT angiogram chest. FINDINGS: Cardiovascular: Normal heart size. No pericardial effusion. Normal caliber thoracic aorta with mild calcific atherosclerosis. Moderate coronary artery calcification. Normal caliber main pulmonary artery. Satisfactory opacification of the pulmonary arteries. Mediastinum/Nodes: No enlarged mediastinal, hilar, or axillary lymph nodes. Thyroid gland, trachea, and esophagus demonstrate no acute findings. Mildly patulous esophagus. Lungs/Pleura: Diffuse peribronchial thickening with debris in the mainstem bronchi extending predominantly into the lower lobes bilaterally. No consolidation, effusion, or pneumothorax. Upper Abdomen: No acute abnormality. Musculoskeletal: No chest wall  abnormality. No acute or significant osseous findings. Review of the MIP images confirms the above findings. IMPRESSION: 1. No pulmonary embolus identified. 2. Diffuse peribronchial thickening with debris in the mainstem bronchi and extending into the lower lobes. Findings may represent acute bronchitis or aspiration given the distribution. No consolidation. 3. Mildly patulous esophagus. 4. Aorta and coronary artery calcific atherosclerosis. Electronically Signed   By: Mitzi Hansen M.D.   On: 05/06/2017 22:46   Dg Chest Port 1 View  Result Date: 05/06/2017 CLINICAL DATA:  Shortness of breath. EXAM: PORTABLE CHEST 1 VIEW COMPARISON:  Radiograph February 20, 2017. FINDINGS: The heart size and mediastinal contours are within normal limits. Both lungs are clear. No pneumothorax or pleural effusion is noted. The visualized skeletal structures are unremarkable. IMPRESSION: No acute cardiopulmonary abnormality seen. Electronically Signed   By: Lupita Raider, M.D.   On: 05/06/2017 20:49        Scheduled Meds: . bethanechol  10 mg Per Tube TID  . enoxaparin  60 mg Subcutaneous Q24H  . famotidine  20 mg Per Tube BID  . glycopyrrolate  1 mg Oral BID  . insulin aspart  0-9 Units Subcutaneous Q4H  . levETIRAcetam  500 mg Per Tube BID  . multivitamin with minerals  1 tablet Per Tube Daily  . polyethylene glycol  17 g Per Tube Daily  . PROBIOTIC DAILY  1 capsule Per Tube BID  . rosuvastatin  20 mg Per Tube q1800  . sodium chloride flush  3 mL Intravenous Q12H   Continuous Infusions: . dextrose    . piperacillin-tazobactam (ZOSYN)  IV 2.25 g (05/07/17 0815)  . [START ON 05/08/2017] vancomycin       LOS: 1 day    Time spent: 35 minutes.     Alba Cory, MD [facility] Pager [phone]  If 7PM-7AM, please contact night-coverage [email or web address] Password [facility] 05/07/2017, 8:24 AM

## 2017-05-08 ENCOUNTER — Inpatient Hospital Stay (HOSPITAL_COMMUNITY): Payer: Medicaid Other

## 2017-05-08 ENCOUNTER — Ambulatory Visit: Payer: Self-pay | Admitting: General Surgery

## 2017-05-08 DIAGNOSIS — I5030 Unspecified diastolic (congestive) heart failure: Secondary | ICD-10-CM

## 2017-05-08 LAB — BASIC METABOLIC PANEL
Anion gap: 11 (ref 5–15)
BUN: 20 mg/dL (ref 6–20)
CALCIUM: 8.7 mg/dL — AB (ref 8.9–10.3)
CO2: 24 mmol/L (ref 22–32)
CREATININE: 1.07 mg/dL — AB (ref 0.44–1.00)
Chloride: 117 mmol/L — ABNORMAL HIGH (ref 101–111)
GFR calc non Af Amer: 58 mL/min — ABNORMAL LOW (ref >60)
Glucose, Bld: 132 mg/dL — ABNORMAL HIGH (ref 65–99)
Potassium: 2.4 mmol/L — CL (ref 3.5–5.1)
SODIUM: 152 mmol/L — AB (ref 135–145)

## 2017-05-08 LAB — CBC
HCT: 36.8 % (ref 36.0–46.0)
Hemoglobin: 11.1 g/dL — ABNORMAL LOW (ref 12.0–15.0)
MCH: 28.6 pg (ref 26.0–34.0)
MCHC: 29.6 g/dL — AB (ref 30.0–36.0)
MCV: 96.6 fL (ref 78.0–100.0)
PLATELETS: 198 10*3/uL (ref 150–400)
RBC: 3.81 MIL/uL — ABNORMAL LOW (ref 3.87–5.11)
RDW: 15.4 % (ref 11.5–15.5)
WBC: 16.6 10*3/uL — ABNORMAL HIGH (ref 4.0–10.5)

## 2017-05-08 LAB — MRSA PCR SCREENING: MRSA by PCR: NEGATIVE

## 2017-05-08 LAB — URINE CULTURE: CULTURE: NO GROWTH

## 2017-05-08 LAB — ECHOCARDIOGRAM COMPLETE
Height: 60 in
Weight: 2158.74 oz

## 2017-05-08 LAB — UREA NITROGEN, URINE: Urea Nitrogen, Ur: 497 mg/dL

## 2017-05-08 LAB — GLUCOSE, CAPILLARY
GLUCOSE-CAPILLARY: 148 mg/dL — AB (ref 65–99)
Glucose-Capillary: 114 mg/dL — ABNORMAL HIGH (ref 65–99)
Glucose-Capillary: 129 mg/dL — ABNORMAL HIGH (ref 65–99)
Glucose-Capillary: 97 mg/dL (ref 65–99)
Glucose-Capillary: 94 mg/dL (ref 65–99)

## 2017-05-08 LAB — MAGNESIUM: MAGNESIUM: 2 mg/dL (ref 1.7–2.4)

## 2017-05-08 LAB — CBG MONITORING, ED: GLUCOSE-CAPILLARY: 113 mg/dL — AB (ref 65–99)

## 2017-05-08 MED ORDER — POTASSIUM CHLORIDE 20 MEQ/15ML (10%) PO SOLN
40.0000 meq | Freq: Once | ORAL | Status: AC
  Administered 2017-05-08: 40 meq via ORAL
  Filled 2017-05-08: qty 30

## 2017-05-08 MED ORDER — JEVITY 1.2 CAL PO LIQD: 1000.0000 mL | ORAL | Status: DC

## 2017-05-08 MED ORDER — PIPERACILLIN-TAZOBACTAM 3.375 G IVPB
3.3750 g | Freq: Three times a day (TID) | INTRAVENOUS | Status: DC
  Administered 2017-05-08 – 2017-05-09 (×3): 3.375 g via INTRAVENOUS
  Filled 2017-05-08 (×4): qty 50

## 2017-05-08 MED ORDER — POTASSIUM CHLORIDE 20 MEQ PO PACK
20.0000 meq | PACK | Freq: Once | ORAL | Status: AC
  Administered 2017-05-08: 20 meq via ORAL
  Filled 2017-05-08: qty 1

## 2017-05-08 MED ORDER — CHLORHEXIDINE GLUCONATE 0.12 % MT SOLN
15.0000 mL | Freq: Two times a day (BID) | OROMUCOSAL | Status: DC
  Administered 2017-05-08 – 2017-05-13 (×11): 15 mL via OROMUCOSAL
  Filled 2017-05-08 (×8): qty 15

## 2017-05-08 MED ORDER — POTASSIUM CHLORIDE 20 MEQ/15ML (10%) PO SOLN: 40.0000 meq | ORAL | Status: DC

## 2017-05-08 MED ORDER — OSMOLITE 1.2 CAL PO LIQD
1000.0000 mL | ORAL | Status: DC
  Administered 2017-05-08: 1000 mL
  Filled 2017-05-08 (×2): qty 1000

## 2017-05-08 MED ORDER — FREE WATER
200.0000 mL | Freq: Three times a day (TID) | Status: DC
  Administered 2017-05-08 – 2017-05-13 (×14): 200 mL

## 2017-05-08 MED ORDER — ENOXAPARIN SODIUM 60 MG/0.6ML ~~LOC~~ SOLN
60.0000 mg | Freq: Two times a day (BID) | SUBCUTANEOUS | Status: DC
  Administered 2017-05-08 – 2017-05-13 (×10): 60 mg via SUBCUTANEOUS
  Filled 2017-05-08 (×10): qty 0.6

## 2017-05-08 MED ORDER — POTASSIUM CHLORIDE 10 MEQ/100ML IV SOLN
10.0000 meq | INTRAVENOUS | Status: AC
  Administered 2017-05-08 (×6): 10 meq via INTRAVENOUS
  Filled 2017-05-08 (×6): qty 100

## 2017-05-08 MED ORDER — VANCOMYCIN HCL 500 MG IV SOLR
500.0000 mg | Freq: Two times a day (BID) | INTRAVENOUS | Status: DC
  Administered 2017-05-08: 500 mg via INTRAVENOUS
  Filled 2017-05-08: qty 500

## 2017-05-08 MED ORDER — POTASSIUM CHLORIDE 10 MEQ/100ML IV SOLN: 10.0000 meq | INTRAVENOUS | Status: DC

## 2017-05-08 MED ORDER — VANCOMYCIN HCL 500 MG IV SOLR
500.0000 mg | Freq: Two times a day (BID) | INTRAVENOUS | Status: DC
  Administered 2017-05-09: 500 mg via INTRAVENOUS
  Filled 2017-05-08 (×2): qty 500

## 2017-05-08 MED ORDER — ORAL CARE MOUTH RINSE
15.0000 mL | Freq: Two times a day (BID) | OROMUCOSAL | Status: DC
  Administered 2017-05-08 – 2017-05-13 (×11): 15 mL via OROMUCOSAL

## 2017-05-08 MED FILL — Medication: Qty: 1 | Status: AC

## 2017-05-08 NOTE — Progress Notes (Signed)
Pharmacy Antibiotic Note  Rachel Vang is a 52 y.o. female admitted on 05/06/2017 with pneumonia and Coag neg staph bacteremia.  Pharmacy has been consulted for Vancomycin and Zosyn dosing. WBC stable, elevated at 16.6. SCr elevated at 2.68 on admission, now improved to 1.07. Est CrCl ~ 49 mL/min. Tmax 103.8 on admission, now improved at 99.   Plan: -Increase Vancomycin to  IV q12h -Increase Zosyn to 3.375g IV q8h-EI -Monitor renal fx, cultures, VR as needed  Per renal adjustment policy:  -Increased Lovenox back to 60 q12 w/ improved renal function -Pepcid current BID - will leave as borderline, change if CrCl remains <50  Height: 5' (152.4 cm) Weight: 134 lb 14.7 oz (61.2 kg) IBW/kg (Calculated) : 45.5  Temp (24hrs), Avg:98.7 F (37.1 C), Min:98.1 F (36.7 C), Max:99.3 F (37.4 C)  Recent Labs  Lab 05/06/17 2023 05/06/17 2102 05/06/17 2332 05/07/17 0041 05/08/17 0242  WBC 16.7*  --   --   --  16.6*  CREATININE 2.68*  --   --  2.10* 1.07*  LATICACIDVEN  --  3.27* 0.87 1.3  --     Estimated Creatinine Clearance: 49.2 mL/min (A) (by C-G formula based on SCr of 1.07 mg/dL (H)).    No Known Allergies  Antimicrobials this admission: 4/8 Vancomycin >> 4/8 Zosyn >>  Dose adjustments this admission: 4/10 Incr Vanc to  q12 for improved renal function 4/10 Incr Zosyn to 3.375g IV q8h -EI for improved renal function  Microbiology results: 4/8 urine: negative 4/8 blood: 2/2 Coag neg staph (BCID CNS) 4/9 MRSA pcr negative   Thank you for allowing pharmacy to be a part of this patient's care.  Link Snuffer, PharmD, BCPS, BCCCP Clinical Pharmacist Clinical phone 05/08/2017 until 3:[phone] After hours, please call #[phone] 05/08/2017 10:12 AM

## 2017-05-08 NOTE — Progress Notes (Signed)
Patient ID: Rachel Vang, female   DOB: 08/05/1965, 52 y.o.   MRN: [id number]       Subjective: Pt getting an echo right now.  Denies a BM, but nurse tech states she just had a large BM.  Objective: Vital signs in last 24 hours: Temp:  [98.1 F (36.7 C)-99.3 F (37.4 C)] 99.3 F (37.4 C) (04/10 0348) Pulse Rate:  [90-113] 98 (04/10 0700) Resp:  [12-34] 30 (04/10 0700) BP: (83-129)/(66-105) 104/80 (04/10 0700) SpO2:  [92 %-100 %] 97 % (04/10 0700) Weight:  [61.2 kg (134 lb 14.7 oz)] 61.2 kg (134 lb 14.7 oz) (04/10 0231) Last BM Date: 05/08/17  Intake/Output from previous day: 04/09 0701 - 04/10 0700 In: 1050 [I.V.:1000; IV Piggyback:50] Out: -  Intake/Output this shift: No intake/output data recorded.  PE: Abd: soft, G-tube still clamped, NT  Lab Results:  Recent Labs    05/06/17 2023 05/08/17 0242  WBC 16.7* 16.6*  HGB 14.5 11.1*  HCT 47.9* 36.8  PLT 306 198   BMET Recent Labs    05/07/17 0041 05/08/17 0242  NA 158* 152*  K 4.7 2.4*  CL 128* 117*  CO2 20* 24  GLUCOSE 109* 132*  BUN 60* 20  CREATININE 2.10* 1.07*  CALCIUM 7.4* 8.7*   PT/INR Recent Labs    05/07/17 0041  LABPROT 19.3*  INR 1.64   CMP     Component Value Date/Time   NA 152 (H) 05/08/2017 0242   NA 156 (A) 02/14/2017   K 2.4 (LL) 05/08/2017 0242   CL 117 (H) 05/08/2017 0242   CO2 24 05/08/2017 0242   GLUCOSE 132 (H) 05/08/2017 0242   BUN 20 05/08/2017 0242   BUN 34 (A) 02/14/2017   CREATININE 1.07 (H) 05/08/2017 0242   CALCIUM 8.7 (L) 05/08/2017 0242   PROT 5.3 (L) 05/07/2017 0041   ALBUMIN 2.3 (L) 05/07/2017 0041   AST 33 05/07/2017 0041   ALT 15 05/07/2017 0041   ALKPHOS 52 05/07/2017 0041   BILITOT 0.6 05/07/2017 0041   GFRNONAA 58 (L) 05/08/2017 0242   GFRAA >60 05/08/2017 0242   Lipase     Component Value Date/Time   LIPASE 19 05/13/2015 1931       Studies/Results: Ct Abdomen Pelvis Wo Contrast  Result Date: 05/07/2017 CLINICAL DATA:  Abdominal  distension EXAM: CT ABDOMEN AND PELVIS WITHOUT CONTRAST TECHNIQUE: Multidetector CT imaging of the abdomen and pelvis was performed following the standard protocol without IV contrast. COMPARISON:  CT abdomen pelvis 02/22/2017, CT chest 05/06/2017 FINDINGS: Lower chest: Lung bases demonstrate trace right pleural effusion with development of patchy consolidation at the right lung base. Trace pericardial effusion. Hepatobiliary: No focal hepatic abnormality. Layering sludge or small stones in the gallbladder. No biliary dilatation Pancreas: Unremarkable. No pancreatic ductal dilatation or surrounding inflammatory changes. Spleen: Normal in size without focal abnormality. Adrenals/Urinary Tract: Adrenal glands are within normal limits. No hydronephrosis. Patchy hyperdensity within the right renal cortex with small amount of excreted contrast in the collecting systems. Residual contrast within the urinary bladder. Stomach/Bowel: Gastrostomy tube in the body of the stomach. Stomach is decompressed. Dilated segment of small bowel in the right abdomen measuring up to 4.6 cm. Abrupt transition two decompressed caliber small bowel in the central pelvis, consistent with bowel obstruction. Moderate retained feces in the rectum. Mild colon diverticular disease. Small amount of radiopaque material within the distal colon. Vascular/Lymphatic: Moderate aortic atherosclerosis. No aneurysmal dilatation. No significantly enlarged lymph nodes. Reproductive: Status post hysterectomy. No  adnexal masses. Other: Negative for free air or significant free fluid. Small hyperdense foci and gas within the subcutaneous fat of the abdominal wall anteriorly, possibly due to subcutaneous injections. Musculoskeletal: No acute or suspicious abnormality. IMPRESSION: 1. Focally dilated segment of small bowel within the right abdomen with abrupt transition to decompressed distal small bowel in the central upper pelvis with decompressed distal small  bowel and colon; overall pattern favors a mechanical small bowel obstruction. Given the focal appearance and right-sided distribution of the dilated small bowel, could consider closed loop obstruction although no definite mesenteric swirling or mesenteric vascular crowding is identified. Negative for intramural air or free air. 2. Development of trace right pleural effusion with consolidation at the right base which may reflect atelectasis or pneumonia 3. Patchy increased cortical density in the right kidney, suspect for delayed nephrogram as may be seen with compromised renal function. 4. Layering stones or sludge within the gallbladder. Electronically Signed   By: Jasmine Pang M.D.   On: 05/07/2017 15:24   Dg Abd 1 View  Result Date: 05/07/2017 CLINICAL DATA:  In communicative patient, severe abdominal pain and tightness, vomiting, urinary retention EXAM: ABDOMEN - 1 VIEW COMPARISON:  CT abdomen pelvis of 02/22/2017 FINDINGS: Compared to the prior CT of the abdomen pelvis, there are dilated loops of small bowel present and very little colonic bowel gas is seen. Although this could indicate ileus, partial small bowel obstruction is a definite consideration. Gastrostomy tube again is noted. Free air cannot be evaluated on the supine images. No opaque calculi are seen. Some contrast is noted within the urinary bladder. The bones are osteopenic. IMPRESSION: 1. Dilated loops of small bowel with very little colonic bowel gas. Possible ileus but cannot exclude partial small bowel obstruction. CT the abdomen pelvis may be helpful if warranted. 2. Gastrostomy tube is present. 3. Osteopenia. Electronically Signed   By: Dwyane Dee M.D.   On: 05/07/2017 11:22   Ct Angio Chest Pe W And/or Wo Contrast  Result Date: 05/06/2017 CLINICAL DATA:  52 y/o  F; altered mental status and tachycardia. EXAM: CT ANGIOGRAPHY CHEST WITH CONTRAST TECHNIQUE: Multidetector CT imaging of the chest was performed using the standard protocol  during bolus administration of intravenous contrast. Multiplanar CT image reconstructions and MIPs were obtained to evaluate the vascular anatomy. CONTRAST:  ISOVUE-370 IOPAMIDOL (ISOVUE-370) INJECTION 76% COMPARISON:  02/17/2015 CT angiogram chest. FINDINGS: Cardiovascular: Normal heart size. No pericardial effusion. Normal caliber thoracic aorta with mild calcific atherosclerosis. Moderate coronary artery calcification. Normal caliber main pulmonary artery. Satisfactory opacification of the pulmonary arteries. Mediastinum/Nodes: No enlarged mediastinal, hilar, or axillary lymph nodes. Thyroid gland, trachea, and esophagus demonstrate no acute findings. Mildly patulous esophagus. Lungs/Pleura: Diffuse peribronchial thickening with debris in the mainstem bronchi extending predominantly into the lower lobes bilaterally. No consolidation, effusion, or pneumothorax. Upper Abdomen: No acute abnormality. Musculoskeletal: No chest wall abnormality. No acute or significant osseous findings. Review of the MIP images confirms the above findings. IMPRESSION: 1. No pulmonary embolus identified. 2. Diffuse peribronchial thickening with debris in the mainstem bronchi and extending into the lower lobes. Findings may represent acute bronchitis or aspiration given the distribution. No consolidation. 3. Mildly patulous esophagus. 4. Aorta and coronary artery calcific atherosclerosis. Electronically Signed   By: Mitzi Hansen M.D.   On: 05/06/2017 22:46   Dg Chest Port 1 View  Result Date: 05/06/2017 CLINICAL DATA:  Shortness of breath. EXAM: PORTABLE CHEST 1 VIEW COMPARISON:  Radiograph February 20, 2017. FINDINGS: The heart  size and mediastinal contours are within normal limits. Both lungs are clear. No pneumothorax or pleural effusion is noted. The visualized skeletal structures are unremarkable. IMPRESSION: No acute cardiopulmonary abnormality seen. Electronically Signed   By: Lupita Raider, M.D.   On:  05/06/2017 20:49   Dg Abd Portable 1v-small Bowel Obstruction Protocol-initial, 8 Hr Delay  Result Date: 05/08/2017 CLINICAL DATA:  Small bowel obstruction, 8 hour delayed film. EXAM: PORTABLE ABDOMEN - 1 VIEW COMPARISON:  CT yesterday FINDINGS: Administered enteric contrast visualized throughout the ascending, transverse, and to lesser extent descending colon. Decreased gaseous distention of small bowel from prior exam, with persistent dilated small bowel loops centrally in the upper abdomen. No evidence of free air. Elevated right hemidiaphragm. Gastrostomy tube projects over the upper abdomen. IMPRESSION: Administered enteric contrast within the ascending, transverse, and to a lesser extent descending colon. Improving gaseous small bowel distention with persistent dilated small bowel loop in the upper abdomen. Electronically Signed   By: Rubye Oaks M.D.   On: 05/08/2017 03:05    Anti-infectives: Anti-infectives (From admission, onward)   Start     Dose/Rate Route Frequency Ordered Stop   05/08/17 0100  vancomycin (VANCOCIN) IVPB 750 mg/150 ml premix     750 mg 150 mL/hr over 60 Minutes Intravenous Every 24 hours 05/07/17 0014     05/07/17 1130  fluconazole (DIFLUCAN) IVPB 100 mg     100 mg 50 mL/hr over 60 Minutes Intravenous Every 24 hours 05/07/17 1006     05/07/17 0800  piperacillin-tazobactam (ZOSYN) IVPB 2.25 g     2.25 g 100 mL/hr over 30 Minutes Intravenous Every 8 hours 05/07/17 0014     05/06/17 2345  piperacillin-tazobactam (ZOSYN) IVPB 3.375 g     3.375 g 100 mL/hr over 30 Minutes Intravenous  Once 05/06/17 2342 05/07/17 0140   05/06/17 2345  vancomycin (VANCOCIN) IVPB 1000 mg/200 mL premix     1,000 mg 200 mL/hr over 60 Minutes Intravenous  Once 05/06/17 2342 05/07/17 0141   05/06/17 2115  cefTRIAXone (ROCEPHIN) 1 g in sodium chloride 0.9 % 100 mL IVPB     1 g 200 mL/hr over 30 Minutes Intravenous  Once 05/06/17 2108 05/06/17 2209   05/06/17 2115  azithromycin  (ZITHROMAX) 500 mg in sodium chloride 0.9 % 250 mL IVPB     500 mg 250 mL/hr over 60 Minutes Intravenous  Once 05/06/17 2108 05/06/17 2315       Assessment/Plan  SBO -films today show significant amount of contrast throughout her entire colon and improvement in SB dilatation. -may start tube feeds today -no surgical intervention -we will sign off   FEN - may start TFs per primary service VTE - Lovenox    LOS: 2 days    Letha Cape , [facility] Surgery 05/08/2017, 8:24 AM Pager: [phone]

## 2017-05-08 NOTE — Progress Notes (Signed)
PROGRESS NOTE    Rachel Vang  ZOX:[id number] DOB: 1965-08-31 DOA: 05/06/2017 PCP: Kirt Boys, DO    Brief Narrative:  Rachel Vang is a 52 y.o. 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  Patient is reportedly bedbound, nonverbal, and dependent on PEG tube at her baseline.  She was noted by nursing home personnel to be tachypneic and tachycardic today and EMS was called.  Patient was suspected to be in SVT and was treated with adenosine 6 mg, and then 12 mg prior to arrival in the ED.  There was no significant change in heart rate with adenosine and she was also given 500 cc of normal saline.  Patient is unable to contribute to the history due to her clinical condition.  ED Course: Upon arrival to the ED, patient is found to be febrile to 39.9 C, tachycardic to 170, tachypneic, and slightly hypotensive.  EKG features supraventricular tachycardia with rate 171 and chest x-ray is negative for acute cardiopulmonary disease.  Chemistry panel is notable for a sodium of 154, BUN 68, and creatinine 2.68, up from 0.57 in January.  CBC features a leukocytosis to 16,700 and lactic acid is elevated to 3.27.  Troponin is elevated to 0.21.  Urinalysis is unremarkable.  CTA chest is negative for PE, but notable for diffuse peribronchial thickening with debris in the bronchi, concerning for possible acute bronchitis or aspiration.  Blood and urine cultures were collected in the ED, 4.75 L of normal saline was given, and the patient was started on Rocephin and azithromycin.  PCCM was consulted by the ED physician and recommended a medical admission to the stepdown unit.  Heart rate has improved with the IV fluids, MAP remains greater than 65, and the patient will be admitted  to the stepdown unit for ongoing evaluation and management of severe sepsis suspected secondary to a pulmonary source.    Assessment & Plan:   Principal Problem:   Severe sepsis (HCC) Active Problems:   Seizures (HCC)   Elevated troponin   Type II diabetes mellitus with neurological manifestations (HCC)   Essential hypertension, benign   Hemiparesis affecting right side as late effect of stroke (HCC)   Chronic pulmonary embolism (HCC)   AKI (acute kidney injury) (HCC)   Hypernatremia  1-Sepsis; presents with tachycardia, tachypnea, febrile, leukocytosis.  CT angio notable for diffuse peribronchial thickening concerning for bronchitis or aspiration.  Treat for PNA.  Treated with with Vancomycin and Zosyn on admission. Follow WBC>   SBO; Abdominal pain, constipation.  KUB ileus, unable to rule out obstruction.  Will hold robunil.  Ct with possible  small bowel mechanical obstruction. Sx consulted.  Appreciate sx evaluation. Underwent SBO protocol. Had large BM. Ok to start diet.   Bacteremia; staph;  2 blood culture. positive Continue with vancomycin.   AKI; SCr is 2.68 on admission, up from 0.57 in January 2019   Suspect related to infection, hypovolemia. Received IV bolus.  Continue with IV fluids.   Hypokalemia; IV and oral supplementation.   Hypernatremia; continue with  IV fluids to D 5.  Improving.   SVT; suspect related to acute illness, fever.  Resolved with fluids.  Mg normal.    Elevated troponin; Suspect related to demand ischemia, SVT , infection.  ECHO. Normal ef, mild pericardial effusion.  History of cva; history of seizure; continue with keppra.   Type 2 Diabetes;  SSI.   Seizure disorder; on keppra  History of DVT, PE; on lovenox.   Oral candidiasis; continue with IV diflucan.     DVT prophylaxis: lovenox Code Status: full code Family Communication; no family at bedside.  Disposition Plan:  Back to SNF when stable  Consultants:     none   Procedures:  Echo pending.    Antimicrobials:   Vancomycin and zosyn 4-08   Subjective: Alert, still with mild abdominal pain.    Objective: Vitals:   05/08/17 1100 05/08/17 1200 05/08/17 1239 05/08/17 1300  BP: 106/80 102/82  120/79  Pulse: 93 95  91  Resp: (!) 27 (!) 29  (!) 23  Temp:   98.9 F (37.2 C)   TempSrc:   Axillary   SpO2: 99% 98%  100%  Weight:      Height:        Intake/Output Summary (Last 24 hours) at 05/08/2017 1440 Last data filed at 05/08/2017 1300 Gross per 24 hour  Intake 4465 ml  Output 375 ml  Net 4090 ml   Filed Weights   05/06/17 2043 05/08/17 0231  Weight: 56.7 kg (125 lb) 61.2 kg (134 lb 14.7 oz)    Examination:  General exam: NAD Respiratory system: Bilateral ronchus.  Cardiovascular system: S 1, S 2 RRR Gastrointestinal system: Soft, mild tender, less distended. Peg tube in place.  Central nervous system; alert, answer yes and no.  Extremities: no edema Skin; no rash    Data Reviewed: I have personally reviewed following labs and imaging studies  CBC: Recent Labs  Lab 05/06/17 2023 05/08/17 0242  WBC 16.7* 16.6*  NEUTROABS 13.7*  --   HGB 14.5 11.1*  HCT 47.9* 36.8  MCV 96.8 96.6  PLT 306 198   Basic Metabolic Panel: Recent Labs  Lab 05/06/17 2023 05/07/17 0041 05/08/17 0242  NA 154* 158* 152*  K 3.5 4.7 2.4*  CL 123* 128* 117*  CO2 21* 20* 24  GLUCOSE 251* 109* 132*  BUN 68* 60* 20  CREATININE 2.68* 2.10* 1.07*  CALCIUM 9.1 7.4* 8.7*  MG  --   --  2.0   GFR: Estimated Creatinine Clearance: 49.2 mL/min (A) (by C-G formula based on SCr of 1.07 mg/dL (H)). Liver Function Tests: Recent Labs  Lab 05/07/17 0041  AST 33  ALT 15  ALKPHOS 52  BILITOT 0.6  PROT 5.3*  ALBUMIN 2.3*   No results for input(s): LIPASE, AMYLASE in the last 168 hours. No results for input(s): AMMONIA in the last 168 hours. Coagulation Profile: Recent Labs  Lab 05/07/17 0041  INR 1.64   Cardiac  Enzymes: Recent Labs  Lab 05/07/17 0041 05/07/17 0605 05/07/17 1206  TROPONINI 0.20* 0.14* 0.06*   BNP (last 3 results) No results for input(s): PROBNP in the last 8760 hours. HbA1C: No results for input(s): HGBA1C in the last 72 hours. CBG: Recent Labs  Lab 05/07/17 2016 05/08/17 0059 05/08/17 0338 05/08/17 0840 05/08/17 1236  GLUCAP 109* 113* 148* 114* 129*   Lipid Profile: No results for input(s): CHOL, HDL, LDLCALC, TRIG, CHOLHDL, LDLDIRECT in the last 72 hours. Thyroid Function Tests: No results for input(s): TSH, T4TOTAL, FREET4, T3FREE, THYROIDAB in the last 72 hours. Anemia Panel: No results for input(s): VITAMINB12, FOLATE, FERRITIN, TIBC, IRON, RETICCTPCT in the last 72 hours. Sepsis Labs: Recent Labs  Lab 05/06/17 2102 05/06/17 2332 05/07/17 0041  PROCALCITON  --   --  6.18  LATICACIDVEN 3.27* 0.87 1.3    Recent Results (from the past 240 hour(s))  Blood culture (routine x 2)     Status: Abnormal (Preliminary result)   Collection Time: 05/06/17  8:45 PM  Result Value Ref Range Status   Specimen Description BLOOD LEFT ARM  Final   Special Requests   Final    BOTTLES DRAWN AEROBIC AND ANAEROBIC Blood Culture adequate volume   Culture  Setup Time   Final    GRAM POSITIVE COCCI ANAEROBIC BOTTLE ONLY Organism ID to follow CRITICAL RESULT CALLED TO, READ BACK BY AND VERIFIED WITHCindra Presume [facility] 2103 05/07/17 A BROWNING Performed at [facility], [address] [place], [place] [address]    Culture STAPHYLOCOCCUS SPECIES (COAGULASE NEGATIVE) (A)  Final   Report Status PENDING  Incomplete  Blood Culture ID Panel (Reflexed)     Status: Abnormal   Collection Time: 05/06/17  8:45 PM  Result Value Ref Range Status   Enterococcus species NOT DETECTED NOT DETECTED Final   Listeria monocytogenes NOT DETECTED NOT DETECTED Final   Staphylococcus species DETECTED (A) NOT DETECTED Final    Comment: Methicillin (oxacillin) resistant coagulase negative  staphylococcus. Possible blood culture contaminant (unless isolated from more than one blood culture draw or clinical case suggests pathogenicity). No antibiotic treatment is indicated for blood  culture contaminants. CRITICAL RESULT CALLED TO, READ BACK BY AND VERIFIED WITH: H BAIRD PHARMD 2103 05/07/17 A BROWNING    Staphylococcus aureus NOT DETECTED NOT DETECTED Final   Methicillin resistance DETECTED (A) NOT DETECTED Final    Comment: CRITICAL RESULT CALLED TO, READ BACK BY AND VERIFIED WITH: H BAIRD PHARMD 2103 05/07/17 A BROWNING    Streptococcus species NOT DETECTED NOT DETECTED Final   Streptococcus agalactiae NOT DETECTED NOT DETECTED Final   Streptococcus pneumoniae NOT DETECTED NOT DETECTED Final   Streptococcus pyogenes NOT DETECTED NOT DETECTED Final   Acinetobacter baumannii NOT DETECTED NOT DETECTED Final   Enterobacteriaceae species NOT DETECTED NOT DETECTED Final   Enterobacter cloacae complex NOT DETECTED NOT DETECTED Final   Escherichia coli NOT DETECTED NOT DETECTED Final   Klebsiella oxytoca NOT DETECTED NOT DETECTED Final   Klebsiella pneumoniae NOT DETECTED NOT DETECTED Final   Proteus species NOT DETECTED NOT DETECTED Final   Serratia marcescens NOT DETECTED NOT DETECTED Final   Haemophilus influenzae NOT DETECTED NOT DETECTED Final   Neisseria meningitidis NOT DETECTED NOT DETECTED Final   Pseudomonas aeruginosa NOT DETECTED NOT DETECTED Final   Candida albicans NOT DETECTED NOT DETECTED Final   Candida glabrata NOT DETECTED NOT DETECTED Final   Candida krusei NOT DETECTED NOT DETECTED Final   Candida parapsilosis NOT DETECTED NOT DETECTED Final   Candida tropicalis NOT DETECTED NOT DETECTED Final    Comment: Performed at [facility], [address] [place], [place] [address]  Blood culture (routine x 2)     Status: Abnormal (Preliminary result)   Collection Time: 05/06/17  8:58 PM  Result Value Ref Range Status   Specimen Description BLOOD RIGHT HAND   Final   Special Requests   Final    BOTTLES DRAWN AEROBIC ONLY Blood Culture results may not be optimal due to an inadequate volume of blood received in culture bottles   Culture  Setup Time   Final    GRAM POSITIVE COCCI AEROBIC BOTTLE ONLY CRITICAL RESULT CALLED TO, READ BACK BY AND VERIFIED WITHCindra Presume PHARMD 2103 05/07/17 A BROWNING    Culture (A)  Final    STAPHYLOCOCCUS SPECIES (COAGULASE NEGATIVE) SUSCEPTIBILITIES TO FOLLOW Performed at [facility], [address], [place], [place] [address]    Report Status PENDING  Incomplete  Urine culture     Status: None   Collection Time: 05/06/17  9:21 PM  Result Value Ref Range Status   Specimen Description URINE, CATHETERIZED  Final   Special Requests NONE  Final   Culture   Final    NO GROWTH Performed at [facility], [address], [place], [place] [address]    Report Status 05/08/2017 FINAL  Final  MRSA PCR Screening     Status: None   Collection Time: 05/08/17  2:26 AM  Result Value Ref Range Status   MRSA by PCR NEGATIVE NEGATIVE Final    Comment:        The GeneXpert MRSA Assay (FDA approved for NASAL specimens only), is one component of a comprehensive MRSA colonization surveillance program. It is not intended to diagnose MRSA infection nor to guide or monitor treatment for MRSA infections. Performed at [facility], [address], [place], [place] [address]          Radiology Studies: Ct Abdomen Pelvis Wo Contrast  Result Date: 05/07/2017 CLINICAL DATA:  Abdominal distension EXAM: CT ABDOMEN AND PELVIS WITHOUT CONTRAST TECHNIQUE: Multidetector CT imaging of the abdomen and pelvis was performed following the standard protocol without IV contrast. COMPARISON:  CT abdomen pelvis 02/22/2017, CT chest 05/06/2017 FINDINGS: Lower chest: Lung bases demonstrate trace right pleural effusion with development of patchy consolidation at the right lung base. Trace pericardial effusion. Hepatobiliary: No  focal hepatic abnormality. Layering sludge or small stones in the gallbladder. No biliary dilatation Pancreas: Unremarkable. No pancreatic ductal dilatation or surrounding inflammatory changes. Spleen: Normal in size without focal abnormality. Adrenals/Urinary Tract: Adrenal glands are within normal limits. No hydronephrosis. Patchy hyperdensity within the right renal cortex with small amount of excreted contrast in the collecting systems. Residual contrast within the urinary bladder. Stomach/Bowel: Gastrostomy tube in the body of the stomach. Stomach is decompressed. Dilated segment of small bowel in the right abdomen measuring up to 4.6 cm. Abrupt transition two decompressed caliber small bowel in the central pelvis, consistent with bowel obstruction. Moderate retained feces in the rectum. Mild colon diverticular disease. Small amount of radiopaque material within the distal colon. Vascular/Lymphatic: Moderate aortic atherosclerosis. No aneurysmal dilatation. No significantly enlarged lymph nodes. Reproductive: Status post hysterectomy. No adnexal masses. Other: Negative for free air or significant free fluid. Small hyperdense foci and gas within the subcutaneous fat of the abdominal wall anteriorly, possibly due to subcutaneous injections. Musculoskeletal: No acute or suspicious abnormality. IMPRESSION: 1. Focally dilated segment of small bowel within the right abdomen with abrupt transition to decompressed distal small bowel in the central upper pelvis with decompressed distal small bowel and colon; overall pattern favors a mechanical small bowel obstruction. Given the focal appearance and right-sided distribution of the dilated small bowel, could consider closed loop obstruction although no definite mesenteric swirling or mesenteric vascular crowding is identified. Negative for intramural air or free air. 2. Development of trace right pleural effusion with consolidation at the right base which may reflect  atelectasis or pneumonia 3. Patchy increased cortical density in the right kidney, suspect for delayed nephrogram as may be seen with compromised renal function. 4. Layering stones or sludge within the gallbladder. Electronically Signed   By: Jasmine Pang M.D.   On: 05/07/2017 15:24   Dg Abd  1 View  Result Date: 05/07/2017 CLINICAL DATA:  In communicative patient, severe abdominal pain and tightness, vomiting, urinary retention EXAM: ABDOMEN - 1 VIEW COMPARISON:  CT abdomen pelvis of 02/22/2017 FINDINGS: Compared to the prior CT of the abdomen pelvis, there are dilated loops of small bowel present and very little colonic bowel gas is seen. Although this could indicate ileus, partial small bowel obstruction is a definite consideration. Gastrostomy tube again is noted. Free air cannot be evaluated on the supine images. No opaque calculi are seen. Some contrast is noted within the urinary bladder. The bones are osteopenic. IMPRESSION: 1. Dilated loops of small bowel with very little colonic bowel gas. Possible ileus but cannot exclude partial small bowel obstruction. CT the abdomen pelvis may be helpful if warranted. 2. Gastrostomy tube is present. 3. Osteopenia. Electronically Signed   By: Dwyane Dee M.D.   On: 05/07/2017 11:22   Ct Angio Chest Pe W And/or Wo Contrast  Result Date: 05/06/2017 CLINICAL DATA:  52 y/o  F; altered mental status and tachycardia. EXAM: CT ANGIOGRAPHY CHEST WITH CONTRAST TECHNIQUE: Multidetector CT imaging of the chest was performed using the standard protocol during bolus administration of intravenous contrast. Multiplanar CT image reconstructions and MIPs were obtained to evaluate the vascular anatomy. CONTRAST:  ISOVUE-370 IOPAMIDOL (ISOVUE-370) INJECTION 76% COMPARISON:  02/17/2015 CT angiogram chest. FINDINGS: Cardiovascular: Normal heart size. No pericardial effusion. Normal caliber thoracic aorta with mild calcific atherosclerosis. Moderate coronary artery calcification.  Normal caliber main pulmonary artery. Satisfactory opacification of the pulmonary arteries. Mediastinum/Nodes: No enlarged mediastinal, hilar, or axillary lymph nodes. Thyroid gland, trachea, and esophagus demonstrate no acute findings. Mildly patulous esophagus. Lungs/Pleura: Diffuse peribronchial thickening with debris in the mainstem bronchi extending predominantly into the lower lobes bilaterally. No consolidation, effusion, or pneumothorax. Upper Abdomen: No acute abnormality. Musculoskeletal: No chest wall abnormality. No acute or significant osseous findings. Review of the MIP images confirms the above findings. IMPRESSION: 1. No pulmonary embolus identified. 2. Diffuse peribronchial thickening with debris in the mainstem bronchi and extending into the lower lobes. Findings may represent acute bronchitis or aspiration given the distribution. No consolidation. 3. Mildly patulous esophagus. 4. Aorta and coronary artery calcific atherosclerosis. Electronically Signed   By: Mitzi Hansen M.D.   On: 05/06/2017 22:46   Dg Chest Port 1 View  Result Date: 05/06/2017 CLINICAL DATA:  Shortness of breath. EXAM: PORTABLE CHEST 1 VIEW COMPARISON:  Radiograph February 20, 2017. FINDINGS: The heart size and mediastinal contours are within normal limits. Both lungs are clear. No pneumothorax or pleural effusion is noted. The visualized skeletal structures are unremarkable. IMPRESSION: No acute cardiopulmonary abnormality seen. Electronically Signed   By: Lupita Raider, M.D.   On: 05/06/2017 20:49   Dg Abd Portable 1v-small Bowel Obstruction Protocol-initial, 8 Hr Delay  Result Date: 05/08/2017 CLINICAL DATA:  Small bowel obstruction, 8 hour delayed film. EXAM: PORTABLE ABDOMEN - 1 VIEW COMPARISON:  CT yesterday FINDINGS: Administered enteric contrast visualized throughout the ascending, transverse, and to lesser extent descending colon. Decreased gaseous distention of small bowel from prior exam, with  persistent dilated small bowel loops centrally in the upper abdomen. No evidence of free air. Elevated right hemidiaphragm. Gastrostomy tube projects over the upper abdomen. IMPRESSION: Administered enteric contrast within the ascending, transverse, and to a lesser extent descending colon. Improving gaseous small bowel distention with persistent dilated small bowel loop in the upper abdomen. Electronically Signed   By: Rubye Oaks M.D.   On: 05/08/2017 03:05  Scheduled Meds: . acidophilus  1 capsule Oral BID  . bethanechol  10 mg Per Tube TID  . chlorhexidine  15 mL Mouth Rinse BID  . enoxaparin  60 mg Subcutaneous Q12H  . famotidine  20 mg Per Tube BID  . insulin aspart  0-9 Units Subcutaneous Q4H  . levETIRAcetam  500 mg Per Tube BID  . mouth rinse  15 mL Mouth Rinse q12n4p  . multivitamin with minerals  1 tablet Per Tube Daily  . pantoprazole (PROTONIX) IV  40 mg Intravenous Q24H  . rosuvastatin  20 mg Per Tube q1800  . sodium chloride flush  3 mL Intravenous Q12H   Continuous Infusions: . dextrose 100 mL/hr at 05/08/17 0429  . feeding supplement (OSMOLITE 1.2 CAL)    . fluconazole (DIFLUCAN) IV 100 mg (05/08/17 1400)  . piperacillin-tazobactam (ZOSYN)  IV    . vancomycin 500 mg (05/08/17 1330)     LOS: 2 days    Time spent: 35 minutes.     Alba Cory, MD [facility] Pager [phone]  If 7PM-7AM, please contact night-coverage [email or web address] Password [email or web address] 05/08/2017, 2:40 PM

## 2017-05-08 NOTE — Progress Notes (Signed)
Pt transferred to 5 West per bed to 5W18 by 2 RN's

## 2017-05-08 NOTE — Progress Notes (Signed)
Called report to 5 west pt will be transferred in floor bed to 5W18. Report to [employer] IncErin  RN. Family aware that opt will be transferred - notified her son given pt update at that time.

## 2017-05-08 NOTE — Progress Notes (Signed)
NURSING PROGRESS NOTE  Rachel MinksStephanie N Tess [id number] Transfer Data: 05/08/2017 5:03 PM Attending Provider: Alba Coryegalado, Belkys A, MD JYN:WGNFAOPCP:Carter, Maxine GlennMonica, DO Code Status: fULL   Rachel Vang is a 52 y.o. female patient transferred from 3MW -No acute distress noted.  -No complaints of shortness of breath.  -No complaints of chest pain.   Cardiac Monitoring: Box # [id number] in place. Cardiac monitor yields:normal sinus rhythm.  Blood pressure (!) 120/91, pulse 91, temperature 98.9 F (37.2 C), temperature source Oral, resp. rate (!) 29, height 5' (1.524 m), weight 61.2 kg (134 lb 14.7 oz), SpO2 100 %.   IV Fluids:  IV in place, occlusive dsg intact without redness, IV cath antecubital left, condition patent and no redness D5W.   Allergies:  Patient has no known allergies.  Past Medical History:   has a past medical history of Acute pulmonary embolism (HCC) (02/19/2015), Acute respiratory failure (HCC), Diabetes mellitus without complication (HCC), DVT (deep venous thrombosis) (HCC) (04/05/2015), Dysphagia, Epilepsy (HCC), GERD (gastroesophageal reflux disease), Hyperlipidemia, Hypertension, IBS (irritable bowel syndrome), Nontraumatic subarachnoid hemorrhage (HCC), SAH (subarachnoid hemorrhage) (HCC), and Urinary retention.  Past Surgical History:   has a past surgical history that includes Radiology with anesthesia (N/A, 01/13/2015); Esophagogastroduodenoscopy (egd) with propofol (N/A, 02/02/2015); PEG placement (N/A, 02/02/2015); Abdominal surgery; Aneurysm coiling; Tracheostomy; Colonoscopy (N/A, 02/20/2015); ir generic historical (08/26/2015); ir generic historical (09/14/2015); IR REPLACE G-TUBE SIMPLE WO FLUORO (06/08/2016); IR REPLACE G-TUBE SIMPLE WO FLUORO (10/24/2016); and IR REPLACE G-TUBE SIMPLE WO FLUORO (01/07/2017).  Social History:   reports that she has quit smoking. She has never used smokeless tobacco. She reports that she does not drink alcohol or use drugs.    Patient/Family  orientated to room. Information packet given to patient/family. Admission inpatient armband information verified with patient/family to include name and date of birth and placed on patient arm. Side rails up x 2, fall assessment and education completed with patient/family. Patient/family able to verbalize understanding of risk associated with falls and verbalized understanding to call for assistance before getting out of bed. Call light within reach. Patient/family able to voice and demonstrate understanding of unit orientation instructions.    Will continue to evaluate and treat per MD orders.

## 2017-05-08 NOTE — Progress Notes (Signed)
  Echocardiogram 2D Echocardiogram has been performed.  Rachel Vang  Rachel Vang 05/08/2017, 9:52 AM

## 2017-05-08 NOTE — Progress Notes (Signed)
Initial Nutrition Assessment  DOCUMENTATION CODES:   Not applicable  INTERVENTION:   Tube Feeding:  Osmolite 1.2 @ 20 ml/hr today  Goal: Osmolite 1.2 @ 55 ml/hr Pro-Stat 30 mL daily Provides 88 g of protein, 1684 kcals, 1069 mL of free water   NUTRITION DIAGNOSIS:   Inadequate oral intake related to acute illness as evidenced by NPO status.  GOAL:   Patient will meet greater than or equal to 90% of their needs  MONITOR:   TF tolerance, Labs, Weight trends, Skin  REASON FOR ASSESSMENT:   Consult Enteral/tube feeding initiation and management  ASSESSMENT:   52 yo female admitted with sepsis, possible SBO, AKI with hypernatremia; pt with hx of hemorrhagic stroke with hemiparesis and dysphagia requiring PEG tube, DM, seizure disorder, DVT/PE. Pt is bed bound, nonverbal and PEG dependent at baseline  Surgery following but SBO resolved and no surgical intervention required  Pt alert, non verbal but shakes head "yes" and "no." Pt on Jevity 1.5 formula at baseline, but pt indicating that she has not been tolerating. Pt denies N/V at this time.   No recent weight loss per weight encounters.  LE wasting likely related to bed bound status  Labs: sodium 152 (H), potassium 2.4 (L), magnesium wdl, Creatinine 1.07, BUN wdl Meds: D5 at 50 ml/hr, acidophilus, MVI, KCL  NUTRITION - FOCUSED PHYSICAL EXAM:    Most Recent Value  Orbital Region  No depletion  Upper Arm Region  No depletion  Thoracic and Lumbar Region  No depletion  Buccal Region  No depletion  Temple Region  No depletion  Clavicle Bone Region  No depletion  Clavicle and Acromion Bone Region  No depletion  Scapular Bone Region  Mild depletion  Dorsal Hand  No depletion  Patellar Region  Moderate depletion  Anterior Thigh Region  Moderate depletion  Posterior Calf Region  Moderate depletion  Edema (RD Assessment)  None       Diet Order:  Diet NPO time specified  EDUCATION NEEDS:   No education needs  have been identified at this time  Skin:  Skin Assessment: Reviewed RN Assessment  Last BM:  4/10  Height:   Ht Readings from Last 1 Encounters:  05/06/17 5' (1.524 m)    Weight:   Wt Readings from Last 1 Encounters:  05/08/17 134 lb 14.7 oz (61.2 kg)    Ideal Body Weight:     BMI:  Body mass index is 26.35 kg/m.  Estimated Nutritional Needs:   Kcal:  1525-1775 kcals  Protein:  73-89 g  Fluid:  >/= 1.5 L   Romelle Starcherate Willie Plain MS, RD, LDN, CNSC [phone] Pager  [phone] Weekend/On-Call Pager

## 2017-05-08 NOTE — Progress Notes (Signed)
Patient potassium level 2.4.Maren ReamerKaren Kirby NP notified.New orders received and carried out.

## 2017-05-08 NOTE — ED Notes (Signed)
Cbg 119 

## 2017-05-08 NOTE — Care Management Note (Addendum)
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  Admitted for Severe Sepsis;CT w/ possible  SOB, Surgery consulted.   Action/Plan: Prior to admission patient lived at nursing home.  PCP noted. CSW assisting with discharge disposition needs. CM will continue to monitor.  Expected Discharge Date:    To Be Determined              Expected Discharge Plan:  Skilled Nursing Facility  In-House Referral:  Clinical Social Work  Discharge planning Services  CM Consult  Status of Service:  In process, will continue to follow  If discussed at Long Length of Stay Meetings, dates discussed:    Additional Comments:  Yancey FlemingsKimberly R Becton, RN  Nurse Case Manger [place] 05/08/2017, 11:31 AM

## 2017-05-08 NOTE — ED Notes (Signed)
Pt soiled with urine and stool. Pt cleaned, clean linen added, bed pads added.

## 2017-05-09 ENCOUNTER — Inpatient Hospital Stay (HOSPITAL_COMMUNITY): Payer: Medicaid Other

## 2017-05-09 DIAGNOSIS — R7881 Bacteremia: Secondary | ICD-10-CM

## 2017-05-09 DIAGNOSIS — R109 Unspecified abdominal pain: Secondary | ICD-10-CM

## 2017-05-09 DIAGNOSIS — I69320 Aphasia following cerebral infarction: Secondary | ICD-10-CM

## 2017-05-09 DIAGNOSIS — E11621 Type 2 diabetes mellitus with foot ulcer: Secondary | ICD-10-CM

## 2017-05-09 DIAGNOSIS — L98491 Non-pressure chronic ulcer of skin of other sites limited to breakdown of skin: Secondary | ICD-10-CM

## 2017-05-09 DIAGNOSIS — R000 Tachycardia, unspecified: Secondary | ICD-10-CM

## 2017-05-09 DIAGNOSIS — K56609 Unspecified intestinal obstruction, unspecified as to partial versus complete obstruction: Secondary | ICD-10-CM

## 2017-05-09 DIAGNOSIS — Z87891 Personal history of nicotine dependence: Secondary | ICD-10-CM

## 2017-05-09 DIAGNOSIS — Z86718 Personal history of other venous thrombosis and embolism: Secondary | ICD-10-CM

## 2017-05-09 DIAGNOSIS — I471 Supraventricular tachycardia: Secondary | ICD-10-CM

## 2017-05-09 DIAGNOSIS — E11622 Type 2 diabetes mellitus with other skin ulcer: Secondary | ICD-10-CM

## 2017-05-09 DIAGNOSIS — L97419 Non-pressure chronic ulcer of right heel and midfoot with unspecified severity: Secondary | ICD-10-CM

## 2017-05-09 LAB — GLUCOSE, CAPILLARY
GLUCOSE-CAPILLARY: 122 mg/dL — AB (ref 65–99)
GLUCOSE-CAPILLARY: 153 mg/dL — AB (ref 65–99)
GLUCOSE-CAPILLARY: 108 mg/dL — AB (ref 65–99)
Glucose-Capillary: 102 mg/dL — ABNORMAL HIGH (ref 65–99)
Glucose-Capillary: 108 mg/dL — ABNORMAL HIGH (ref 65–99)
Glucose-Capillary: 109 mg/dL — ABNORMAL HIGH (ref 65–99)

## 2017-05-09 LAB — CBC
HCT: 37.7 % (ref 36.0–46.0)
Hemoglobin: 11.8 g/dL — ABNORMAL LOW (ref 12.0–15.0)
MCH: 29.1 pg (ref 26.0–34.0)
MCHC: 31.3 g/dL (ref 30.0–36.0)
MCV: 92.9 fL (ref 78.0–100.0)
Platelets: 210 K/uL (ref 150–400)
RBC: 4.06 MIL/uL (ref 3.87–5.11)
RDW: 14.9 % (ref 11.5–15.5)
WBC: 10 K/uL (ref 4.0–10.5)

## 2017-05-09 LAB — BASIC METABOLIC PANEL
ANION GAP: 12 (ref 5–15)
BUN: 7 mg/dL (ref 6–20)
CALCIUM: 9 mg/dL (ref 8.9–10.3)
CO2: 20 mmol/L — AB (ref 22–32)
CREATININE: 0.77 mg/dL (ref 0.44–1.00)
Chloride: 110 mmol/L (ref 101–111)
GLUCOSE: 121 mg/dL — AB (ref 65–99)
Potassium: 3 mmol/L — ABNORMAL LOW (ref 3.5–5.1)
Sodium: 142 mmol/L (ref 135–145)

## 2017-05-09 LAB — CULTURE, BLOOD (ROUTINE X 2): SPECIAL REQUESTS: ADEQUATE

## 2017-05-09 MED ORDER — ACETAMINOPHEN 650 MG RE SUPP: 650.0000 mg | Freq: Four times a day (QID) | RECTAL | Status: DC | PRN

## 2017-05-09 MED ORDER — POTASSIUM CHLORIDE CRYS ER 20 MEQ PO TBCR: 40.0000 meq | EXTENDED_RELEASE_TABLET | Freq: Two times a day (BID) | ORAL | Status: DC

## 2017-05-09 MED ORDER — POTASSIUM CHLORIDE 20 MEQ PO PACK
40.0000 meq | PACK | Freq: Two times a day (BID) | ORAL | Status: AC
  Administered 2017-05-09 (×2): 40 meq
  Filled 2017-05-09 (×2): qty 2

## 2017-05-09 MED ORDER — OSMOLITE 1.2 CAL PO LIQD
1000.0000 mL | ORAL | Status: DC
  Administered 2017-05-09 – 2017-05-12 (×5): 1000 mL
  Filled 2017-05-09 (×8): qty 1000

## 2017-05-09 MED ORDER — ACETAMINOPHEN 325 MG PO TABS
650.0000 mg | ORAL_TABLET | Freq: Four times a day (QID) | ORAL | Status: DC | PRN
  Administered 2017-05-09 – 2017-05-12 (×3): 650 mg
  Filled 2017-05-09 (×3): qty 2

## 2017-05-09 MED ORDER — SODIUM CHLORIDE 0.9 % IV SOLN
3.0000 g | Freq: Four times a day (QID) | INTRAVENOUS | Status: DC
  Administered 2017-05-09 – 2017-05-10 (×4): 3 g via INTRAVENOUS
  Filled 2017-05-09 (×5): qty 3

## 2017-05-09 MED ORDER — POTASSIUM CHLORIDE 20 MEQ PO PACK
40.0000 meq | PACK | Freq: Two times a day (BID) | ORAL | Status: DC
  Filled 2017-05-09: qty 2

## 2017-05-09 MED ORDER — VANCOMYCIN HCL IN DEXTROSE 750-5 MG/150ML-% IV SOLN
750.0000 mg | Freq: Two times a day (BID) | INTRAVENOUS | Status: DC
  Administered 2017-05-09 – 2017-05-11 (×5): 750 mg via INTRAVENOUS
  Filled 2017-05-09 (×5): qty 150

## 2017-05-09 MED ORDER — PRO-STAT SUGAR FREE PO LIQD
30.0000 mL | Freq: Every day | ORAL | Status: DC
Start: 2017-05-09 — End: 2017-05-13
  Administered 2017-05-09 – 2017-05-13 (×5): 30 mL
  Filled 2017-05-09 (×5): qty 30

## 2017-05-09 NOTE — Progress Notes (Signed)
ANTIBIOTIC CONSULT NOTE - INITIAL  Pharmacy Consult for ampicillin-sulbactam Indication: Aspiration Pneumonia  No Known Allergies  Patient Measurements: Height: 5' (152.4 cm) Weight: 138 lb 0.1 oz (62.6 kg) IBW/kg (Calculated) : 45.5 Adjusted Body Weight: 62.6  Vital Signs: Temp: 98.7 F (37.1 C) (04/11 1204) Temp Source: Oral (04/11 1204) BP: 139/96 (04/11 1204) Pulse Rate: 121 (04/11 0358) Intake/Output from previous day: 04/10 0701 - 04/11 0700 In: 3720.7 [I.V.:2051.7; NG/GT:919; IV Piggyback:750] Out: 375 [Urine:375] Intake/Output from this shift: No intake/output data recorded.  Labs: Recent Labs    05/06/17 2023 05/06/17 2121 05/07/17 0041 05/08/17 0242 05/09/17 0555  WBC 16.7*  --   --  16.6* 10.0  HGB 14.5  --   --  11.1* 11.8*  PLT 306  --   --  198 210  LABCREA  --  238.73  --   --   --   CREATININE 2.68*  --  2.10* 1.07* 0.77   Estimated Creatinine Clearance: 66.4 mL/min (by C-G formula based on SCr of 0.77 mg/dL). No results for input(s): VANCOTROUGH, VANCOPEAK, VANCORANDOM, GENTTROUGH, GENTPEAK, GENTRANDOM, TOBRATROUGH, TOBRAPEAK, TOBRARND, AMIKACINPEAK, AMIKACINTROU, AMIKACIN in the last 72 hours.   Microbiology: Recent Results (from the past 720 hour(s))  Blood culture (routine x 2)     Status: Abnormal   Collection Time: 05/06/17  8:45 PM  Result Value Ref Range Status   Specimen Description BLOOD LEFT ARM  Final   Special Requests   Final    BOTTLES DRAWN AEROBIC AND ANAEROBIC Blood Culture adequate volume   Culture  Setup Time   Final    GRAM POSITIVE COCCI ANAEROBIC BOTTLE ONLY Organism ID to follow CRITICAL RESULT CALLED TO, READ BACK BY AND VERIFIED WITH: H BAIRD PHARMD 2103 05/07/17 A BROWNING    Culture (A)  Final    STAPHYLOCOCCUS SPECIES (COAGULASE NEGATIVE) SUSCEPTIBILITIES PERFORMED ON PREVIOUS CULTURE WITHIN THE LAST 5 DAYS. Performed at [facility], [address], [place], [place] [address]    Report Status 05/09/2017  FINAL  Final  Blood Culture ID Panel (Reflexed)     Status: Abnormal   Collection Time: 05/06/17  8:45 PM  Result Value Ref Range Status   Enterococcus species NOT DETECTED NOT DETECTED Final   Listeria monocytogenes NOT DETECTED NOT DETECTED Final   Staphylococcus species DETECTED (A) NOT DETECTED Final    Comment: Methicillin (oxacillin) resistant coagulase negative staphylococcus. Possible blood culture contaminant (unless isolated from more than one blood culture draw or clinical case suggests pathogenicity). No antibiotic treatment is indicated for blood  culture contaminants. CRITICAL RESULT CALLED TO, READ BACK BY AND VERIFIED WITH: H BAIRD PHARMD 2103 05/07/17 A BROWNING    Staphylococcus aureus NOT DETECTED NOT DETECTED Final   Methicillin resistance DETECTED (A) NOT DETECTED Final    Comment: CRITICAL RESULT CALLED TO, READ BACK BY AND VERIFIED WITH: H BAIRD PHARMD 2103 05/07/17 A BROWNING    Streptococcus species NOT DETECTED NOT DETECTED Final   Streptococcus agalactiae NOT DETECTED NOT DETECTED Final   Streptococcus pneumoniae NOT DETECTED NOT DETECTED Final   Streptococcus pyogenes NOT DETECTED NOT DETECTED Final   Acinetobacter baumannii NOT DETECTED NOT DETECTED Final   Enterobacteriaceae species NOT DETECTED NOT DETECTED Final   Enterobacter cloacae complex NOT DETECTED NOT DETECTED Final   Escherichia coli NOT DETECTED NOT DETECTED Final   Klebsiella oxytoca NOT DETECTED NOT DETECTED Final   Klebsiella pneumoniae NOT DETECTED NOT DETECTED Final   Proteus species NOT DETECTED NOT DETECTED Final  Serratia marcescens NOT DETECTED NOT DETECTED Final   Haemophilus influenzae NOT DETECTED NOT DETECTED Final   Neisseria meningitidis NOT DETECTED NOT DETECTED Final   Pseudomonas aeruginosa NOT DETECTED NOT DETECTED Final   Candida albicans NOT DETECTED NOT DETECTED Final   Candida glabrata NOT DETECTED NOT DETECTED Final   Candida krusei NOT DETECTED NOT DETECTED Final    Candida parapsilosis NOT DETECTED NOT DETECTED Final   Candida tropicalis NOT DETECTED NOT DETECTED Final    Comment: Performed at [facility], [address] [place], [place] [address]  Blood culture (routine x 2)     Status: Abnormal   Collection Time: 05/06/17  8:58 PM  Result Value Ref Range Status   Specimen Description BLOOD RIGHT HAND  Final   Special Requests   Final    BOTTLES DRAWN AEROBIC ONLY Blood Culture results may not be optimal due to an inadequate volume of blood received in culture bottles   Culture  Setup Time   Final    GRAM POSITIVE COCCI AEROBIC BOTTLE ONLY CRITICAL RESULT CALLED TO, READ BACK BY AND VERIFIED WITHCindra Presume: H BAIRD [facility] 2103 05/07/17 A BROWNING Performed at [facility], [address] [place], [place] [address]    Culture STAPHYLOCOCCUS SPECIES (COAGULASE NEGATIVE) (A)  Final   Report Status 05/09/2017 FINAL  Final   Organism ID, Bacteria STAPHYLOCOCCUS SPECIES (COAGULASE NEGATIVE)  Final      Susceptibility   Staphylococcus species (coagulase negative) - MIC*    CIPROFLOXACIN <=0.5 SENSITIVE Sensitive     ERYTHROMYCIN >=8 RESISTANT Resistant     GENTAMICIN <=0.5 SENSITIVE Sensitive     OXACILLIN >=4 RESISTANT Resistant     TETRACYCLINE >=16 RESISTANT Resistant     VANCOMYCIN <=0.5 SENSITIVE Sensitive     TRIMETH/SULFA 80 RESISTANT Resistant     CLINDAMYCIN 0.5 SENSITIVE Sensitive     RIFAMPIN <=0.5 SENSITIVE Sensitive     Inducible Clindamycin NEGATIVE Sensitive     * STAPHYLOCOCCUS SPECIES (COAGULASE NEGATIVE)  Urine culture     Status: None   Collection Time: 05/06/17  9:21 PM  Result Value Ref Range Status   Specimen Description URINE, CATHETERIZED  Final   Special Requests NONE  Final   Culture   Final    NO GROWTH Performed at [facility], [address] [place], [place] [address]    Report Status 05/08/2017 FINAL  Final  MRSA PCR Screening     Status: None   Collection Time: 05/08/17  2:26 AM  Result Value Ref  Range Status   MRSA by PCR NEGATIVE NEGATIVE Final    Comment:        The GeneXpert MRSA Assay (FDA approved for NASAL specimens only), is one component of a comprehensive MRSA colonization surveillance program. It is not intended to diagnose MRSA infection nor to guide or monitor treatment for MRSA infections. Performed at [facility], [address] [place], [place] [address]     Medical History: Past Medical History:  Diagnosis Date  . Acute pulmonary embolism (HCC) 02/19/2015  . Acute respiratory failure (HCC)   . Diabetes mellitus without complication (HCC)    Type 2, W/o complications  . DVT (deep venous thrombosis) (HCC) 04/05/2015  . Dysphagia   . Epilepsy (HCC)   . GERD (gastroesophageal reflux disease)   . Hyperlipidemia   . Hypertension   . IBS (irritable bowel syndrome)   . Nontraumatic subarachnoid hemorrhage (HCC)   . SAH (subarachnoid hemorrhage) (  HCC)   . Urinary retention     Assessment: 52 yo female with  aspiration PNA. Pharmacy asked to change piperacillin/tazobactam to ampicillin/sulbactam for aspiration PNA. CrCl~66 ml/min. Afebrile, WBC 10.   Plan:  Ampicillin/Sulbactam 3gm IV q6h Monitor fever curve, CBC, LOT and de-escalate when clinically appropriate  Armonie Mettler A. Jeanella Craze, PharmD, BCPS Clinical Pharmacist [place] Pager: [phone]  05/09/2017,3:06 PM

## 2017-05-09 NOTE — [place] FL2 (Signed)
[place] MEDICAID FL2 LEVEL OF CARE SCREENING TOOL     IDENTIFICATION  Patient Name: Rachel Vang Birthdate: 1965/09/07 Sex: female Admission Date (Current Location): 05/06/2017  [facility] and [place] Number:  Producer, television/film/video and Address:  The . [facility], [address] [place], [place] [address]      Provider Number: [id number]  Attending Physician Name and Address:  Alba Cory, MD  Relative Name and Phone Number:  Luan Moore, [phone]    Current Level of Care: Hospital Recommended Level of Care: Skilled Nursing Facility Prior Approval Number:    Date Approved/Denied:   PASRR Number: [id number]  Discharge Plan: SNF    Current Diagnoses: Patient Active Problem List   Diagnosis Date Noted  . AKI (acute kidney injury) (HCC) 05/06/2017  . Hypernatremia 05/06/2017  . Dyslipidemia associated with type 2 diabetes mellitus (HCC) 04/27/2017  . Gingivitis, acute, plaque induced 03/26/2017  . Hypersecretion of saliva 03/14/2017  . Bruxism (teeth grinding) 03/14/2017  . Lobar pneumonia (HCC)   . Bandemia   . Dehydration   . Acute on chronic respiratory failure with hypoxia (HCC)   . Pressure injury of skin 02/16/2017  . SOB (shortness of breath)   . Fever   . Severe sepsis (HCC) 02/15/2017  . Foot drop, right foot 10/18/2016  . Cough, persistent 09/17/2016  . CVA (cerebrovascular accident) (HCC) 08/30/2016  . GERD without esophagitis 08/30/2016  . History of stroke 08/16/2016  . Localized swelling on left hand 08/16/2016  . Pain and swelling of left wrist 08/16/2016  . Chronic pulmonary embolism (HCC) 07/03/2016  . Weight loss, non-intentional 06/13/2016  . Hemiparesis affecting right side as late effect of stroke (HCC) 09/25/2015  . Type II diabetes mellitus with neurological manifestations (HCC) 05/20/2015  . Essential hypertension, benign 05/20/2015  . Status post insertion of percutaneous endoscopic gastrostomy (PEG)  tube (HCC) 05/20/2015  . DVT (deep venous thrombosis) (HCC) 04/05/2015  . Protein-calorie malnutrition, severe (HCC) 03/25/2015  . Dysphagia 02/18/2015  . Seizures (HCC) 02/18/2015  . Elevated troponin 02/18/2015  . Urine retention 02/18/2015  . History of ETT   . Subarachnoid hemorrhage (HCC)     Orientation RESPIRATION BLADDER Height & Weight     Self  Normal Incontinent, External catheter Weight: 62.6 kg (138 lb 0.1 oz) Height:  5' (152.4 cm)  BEHAVIORAL SYMPTOMS/MOOD NEUROLOGICAL BOWEL NUTRITION STATUS    Convulsions/Seizures Incontinent Feeding tube(Please see DC Summary)  AMBULATORY STATUS COMMUNICATION OF NEEDS Skin   Extensive Assist Verbally PU Stage and Appropriate Care(Pressure injury stage II on heel;)                       Personal Care Assistance Level of Assistance  Bathing, Feeding, Dressing Bathing Assistance: Maximum assistance Feeding assistance: Limited assistance Dressing Assistance: Maximum assistance     Functional Limitations Info             SPECIAL CARE FACTORS FREQUENCY                       Contractures      Additional Factors Info  Code Status, Allergies, Insulin Sliding Scale Code Status Info: Full Allergies Info: NKA   Insulin Sliding Scale Info: Every 4 hours       Current Medications (05/09/2017):  This is the current hospital active medication list Current Facility-Administered Medications  Medication Dose Route Frequency Provider Last Rate Last Dose  . acetaminophen (TYLENOL) tablet 650  mg  650 mg Oral Q6H PRN Opyd, Lavone Neriimothy S, MD       Or  . acetaminophen (TYLENOL) suppository 650 mg  650 mg Rectal Q6H PRN Opyd, Lavone Neriimothy S, MD      . acidophilus (RISAQUAD) capsule 1 capsule  1 capsule Oral BID Opyd, Lavone Neriimothy S, MD   1 capsule at 05/09/17 0840  . bethanechol (URECHOLINE) tablet 10 mg  10 mg Per Tube TID Opyd, Lavone Neriimothy S, MD   10 mg at 05/09/17 0842  . chlorhexidine (PERIDEX) 0.12 % solution 15 mL  15 mL Mouth Rinse  BID Regalado, Belkys A, MD   15 mL at 05/09/17 0841  . dextrose 5 % solution   Intravenous Continuous Regalado, Belkys A, MD 100 mL/hr at 05/08/17 1608 1,000 mL at 05/08/17 1608  . enoxaparin (LOVENOX) injection 60 mg  60 mg Subcutaneous Q12H Link SnufferMillen, Jessica B, RPH   60 mg at 05/09/17 0841  . famotidine (PEPCID) tablet 20 mg  20 mg Per Tube BID Opyd, Lavone Neriimothy S, MD   20 mg at 05/09/17 0841  . feeding supplement (OSMOLITE 1.2 CAL) liquid 1,000 mL  1,000 mL Per Tube Continuous Regalado, Belkys A, MD 20 mL/hr at 05/08/17 1603 1,000 mL at 05/08/17 1603  . fluconazole (DIFLUCAN) IVPB 100 mg  100 mg Intravenous Q24H Regalado, Belkys A, MD   Stopped at 05/09/17 1415  . free water 200 mL  200 mL Per Tube Q8H Regalado, Belkys A, MD   200 mL at 05/09/17 1319  . insulin aspart (novoLOG) injection 0-9 Units  0-9 Units Subcutaneous Q4H Opyd, Lavone Neriimothy S, MD   1 Units at 05/09/17 0840  . levETIRAcetam (KEPPRA) 100 MG/ML solution 500 mg  500 mg Per Tube BID Opyd, Lavone Neriimothy S, MD   500 mg at 05/09/17 0841  . MEDLINE mouth rinse  15 mL Mouth Rinse q12n4p Regalado, Belkys A, MD   15 mL at 05/09/17 1311  . multivitamin with minerals tablet 1 tablet  1 tablet Per Tube Daily Opyd, Lavone Neriimothy S, MD   1 tablet at 05/09/17 0841  . pantoprazole (PROTONIX) injection 40 mg  40 mg Intravenous Q24H Regalado, Belkys A, MD   40 mg at 05/09/17 1311  . piperacillin-tazobactam (ZOSYN) IVPB 3.375 g  3.375 g Intravenous Q8H Quenton FetterMillen, Jessica B, RPH   Stopped at 05/09/17 1308  . potassium chloride (KLOR-CON) packet 40 mEq  40 mEq Per Tube BID Regalado, Belkys A, MD   40 mEq at 05/09/17 1312  . rosuvastatin (CRESTOR) tablet 20 mg  20 mg Per Tube q1800 Opyd, Lavone Neriimothy S, MD   20 mg at 05/08/17 1822  . sodium chloride flush (NS) 0.9 % injection 3 mL  3 mL Intravenous Q12H Opyd, Lavone Neriimothy S, MD   3 mL at 05/08/17 2342  . vancomycin (VANCOCIN) IVPB 750 mg/150 ml premix  750 mg Intravenous Q12H Norva PavlovRobertson, Crystal S, RPH 150 mL/hr at 05/09/17 1421 750 mg at  05/09/17 1421     Discharge Medications: Please see discharge summary for a list of discharge medications.  Relevant Imaging Results:  Relevant Lab Results:   Additional Information SS# [id number]  Mandrell Vangilder S [place], [place]

## 2017-05-09 NOTE — Progress Notes (Signed)
PROGRESS NOTE    Rachel Vang  ZOX:[id number] DOB: [birth year] DOA: 05/06/2017 PCP: Kirt Boys, DO    Brief Narrative:  Rachel Vang is a 52 y.o. 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  Patient is reportedly bedbound, nonverbal, and dependent on PEG tube at her baseline.  She was noted by nursing home personnel to be tachypneic and tachycardic today and EMS was called.  Patient was suspected to be in SVT and was treated with adenosine 6 mg, and then 12 mg prior to arrival in the ED.  There was no significant change in heart rate with adenosine and she was also given 500 cc of normal saline.  Patient is unable to contribute to the history due to her clinical condition.  ED Course: Upon arrival to the ED, patient is found to be febrile to 39.9 C, tachycardic to 170, tachypneic, and slightly hypotensive.  EKG features supraventricular tachycardia with rate 171 and chest x-ray is negative for acute cardiopulmonary disease.  Chemistry panel is notable for a sodium of 154, BUN 68, and creatinine 2.68, up from 0.57 in January.  CBC features a leukocytosis to 16,700 and lactic acid is elevated to 3.27.  Troponin is elevated to 0.21.  Urinalysis is unremarkable.  CTA chest is negative for PE, but notable for diffuse peribronchial thickening with debris in the bronchi, concerning for possible acute bronchitis or aspiration.  Blood and urine cultures were collected in the ED, 4.75 L of normal saline was given, and the patient was started on Rocephin and azithromycin.  PCCM was consulted by the ED physician and recommended a medical admission to the stepdown unit.  Heart rate has improved with the IV fluids, MAP remains greater than 65, and the patient will be admitted  to the stepdown unit for ongoing evaluation and management of severe sepsis suspected secondary to a pulmonary source.    Assessment & Plan:   Principal Problem:   Severe sepsis (HCC) Active Problems:   Seizures (HCC)   Elevated troponin   Type II diabetes mellitus with neurological manifestations (HCC)   Essential hypertension, benign   Hemiparesis affecting right side as late effect of stroke (HCC)   Chronic pulmonary embolism (HCC)   AKI (acute kidney injury) (HCC)   Hypernatremia  1-Sepsis; presents with tachycardia, tachypnea, febrile, leukocytosis.  CT angio notable for diffuse peribronchial thickening concerning for bronchitis or aspiration.  Treat for PNA.  Treated with with Vancomycin and Zosyn on admission. Follow WBC>  Treating for aspiration PNA. Change antibiotics to Unasyn.   SBO; Abdominal pain, constipation.  KUB ileus, unable to rule out obstruction.  Will hold robunil.  Ct with possible  small bowel mechanical obstruction. Sx consulted.  Appreciate sx evaluation. Underwent SBO protocol. Had large BM. Ok to start diet.   Bacteremia; staph;  2 blood culture. positive Continue with vancomycin.  ID consulted.  ECHO no vegetation.   AKI; SCr is 2.68 on admission, up from 0.57 in January 2019   Suspect related to infection, hypovolemia. Received IV bolus.  Continue with IV fluids.  Improved.   Hypokalemia; replete orally  Hypernatremia; continue with  IV fluids to D 5.  Improving.   SVT; suspect related to acute illness, fever.  Resolved with fluids.  Mg normal.    Elevated troponin; Suspect related  to demand ischemia, SVT , infection.  ECHO. Normal ef, mild pericardial effusion.    History of cva; history of seizure; continue with keppra.   Type 2 Diabetes;  SSI.   Seizure disorder; on keppra  History of DVT, PE; on lovenox.   Oral candidiasis; continue with IV diflucan.     DVT prophylaxis: lovenox Code Status: full code Family  Communication; no family at bedside.  Disposition Plan:  Back to SNF when stable  Consultants:   none   Procedures:  Echo pending.    Antimicrobials:   Vancomycin and zosyn 4-08   Subjective: Alert, she doesn't speak  A lot.  Denies pain  Coughing   Objective: Vitals:   05/09/17 0644 05/09/17 0806 05/09/17 0823 05/09/17 1204  BP:  116/86  (!) 139/96  Pulse:      Resp:  17  (!) 23  Temp:  97.7 F (36.5 C) 98 F (36.7 C) 98.7 F (37.1 C)  TempSrc:  Oral Oral Oral  SpO2:  100%    Weight: 62.6 kg (138 lb 0.1 oz)     Height:        Intake/Output Summary (Last 24 hours) at 05/09/2017 1442 Last data filed at 05/09/2017 1404 Gross per 24 hour  Intake 2569 ml  Output -  Net 2569 ml   Filed Weights   05/06/17 2043 05/08/17 0231 05/09/17 0644  Weight: 56.7 kg (125 lb) 61.2 kg (134 lb 14.7 oz) 62.6 kg (138 lb 0.1 oz)    Examination:  General exam: NAD Respiratory system: Bilateral ronchus.  Cardiovascular system: S 1, S 2 RRR Gastrointestinal system: soft, mild tenderness, peg tube  Central nervous system; alert  Extremities: no edema  Skin; no rash    Data Reviewed: I have personally reviewed following labs and imaging studies  CBC: Recent Labs  Lab 05/06/17 2023 05/08/17 0242 05/09/17 0555  WBC 16.7* 16.6* 10.0  NEUTROABS 13.7*  --   --   HGB 14.5 11.1* 11.8*  HCT 47.9* 36.8 37.7  MCV 96.8 96.6 92.9  PLT 306 198 210   Basic Metabolic Panel: Recent Labs  Lab 05/06/17 2023 05/07/17 0041 05/08/17 0242 05/09/17 0555  NA 154* 158* 152* 142  K 3.5 4.7 2.4* 3.0*  CL 123* 128* 117* 110  CO2 21* 20* 24 20*  GLUCOSE 251* 109* 132* 121*  BUN 68* 60* 20 7  CREATININE 2.68* 2.10* 1.07* 0.77  CALCIUM 9.1 7.4* 8.7* 9.0  MG  --   --  2.0  --    GFR: Estimated Creatinine Clearance: 66.4 mL/min (by C-G formula based on SCr of 0.77 mg/dL). Liver Function Tests: Recent Labs  Lab 05/07/17 0041  AST 33  ALT 15  ALKPHOS 52  BILITOT 0.6  PROT 5.3*    ALBUMIN 2.3*   No results for input(s): LIPASE, AMYLASE in the last 168 hours. No results for input(s): AMMONIA in the last 168 hours. Coagulation Profile: Recent Labs  Lab 05/07/17 0041  INR 1.64   Cardiac Enzymes: Recent Labs  Lab 05/07/17 0041 05/07/17 0605 05/07/17 1206  TROPONINI 0.20* 0.14* 0.06*   BNP (last 3 results) No results for input(s): PROBNP in the last 8760 hours. HbA1C: No results for input(s): HGBA1C in the last 72 hours. CBG: Recent Labs  Lab 05/08/17 2027 05/09/17 0108 05/09/17 0407 05/09/17 0756 05/09/17 1220  GLUCAP 94 102* 108* 122* 109*   Lipid Profile: No results for input(s): CHOL, HDL, LDLCALC, TRIG, CHOLHDL, LDLDIRECT in the last 72 hours.  Thyroid Function Tests: No results for input(s): TSH, T4TOTAL, FREET4, T3FREE, THYROIDAB in the last 72 hours. Anemia Panel: No results for input(s): VITAMINB12, FOLATE, FERRITIN, TIBC, IRON, RETICCTPCT in the last 72 hours. Sepsis Labs: Recent Labs  Lab 05/06/17 2102 05/06/17 2332 05/07/17 0041  PROCALCITON  --   --  6.18  LATICACIDVEN 3.27* 0.87 1.3    Recent Results (from the past 240 hour(s))  Blood culture (routine x 2)     Status: Abnormal   Collection Time: 05/06/17  8:45 PM  Result Value Ref Range Status   Specimen Description BLOOD LEFT ARM  Final   Special Requests   Final    BOTTLES DRAWN AEROBIC AND ANAEROBIC Blood Culture adequate volume   Culture  Setup Time   Final    GRAM POSITIVE COCCI ANAEROBIC BOTTLE ONLY Organism ID to follow CRITICAL RESULT CALLED TO, READ BACK BY AND VERIFIED WITH: H BAIRD PHARMD 2103 05/07/17 A BROWNING    Culture (A)  Final    STAPHYLOCOCCUS SPECIES (COAGULASE NEGATIVE) SUSCEPTIBILITIES PERFORMED ON PREVIOUS CULTURE WITHIN THE LAST 5 DAYS. Performed at [facility], [address], [place], [place] [address]    Report Status 05/09/2017 FINAL  Final  Blood Culture ID Panel (Reflexed)     Status: Abnormal   Collection Time: 05/06/17  8:45 PM   Result Value Ref Range Status   Enterococcus species NOT DETECTED NOT DETECTED Final   Listeria monocytogenes NOT DETECTED NOT DETECTED Final   Staphylococcus species DETECTED (A) NOT DETECTED Final    Comment: Methicillin (oxacillin) resistant coagulase negative staphylococcus. Possible blood culture contaminant (unless isolated from more than one blood culture draw or clinical case suggests pathogenicity). No antibiotic treatment is indicated for blood  culture contaminants. CRITICAL RESULT CALLED TO, READ BACK BY AND VERIFIED WITH: H BAIRD PHARMD 2103 05/07/17 A BROWNING    Staphylococcus aureus NOT DETECTED NOT DETECTED Final   Methicillin resistance DETECTED (A) NOT DETECTED Final    Comment: CRITICAL RESULT CALLED TO, READ BACK BY AND VERIFIED WITH: H BAIRD PHARMD 2103 05/07/17 A BROWNING    Streptococcus species NOT DETECTED NOT DETECTED Final   Streptococcus agalactiae NOT DETECTED NOT DETECTED Final   Streptococcus pneumoniae NOT DETECTED NOT DETECTED Final   Streptococcus pyogenes NOT DETECTED NOT DETECTED Final   Acinetobacter baumannii NOT DETECTED NOT DETECTED Final   Enterobacteriaceae species NOT DETECTED NOT DETECTED Final   Enterobacter cloacae complex NOT DETECTED NOT DETECTED Final   Escherichia coli NOT DETECTED NOT DETECTED Final   Klebsiella oxytoca NOT DETECTED NOT DETECTED Final   Klebsiella pneumoniae NOT DETECTED NOT DETECTED Final   Proteus species NOT DETECTED NOT DETECTED Final   Serratia marcescens NOT DETECTED NOT DETECTED Final   Haemophilus influenzae NOT DETECTED NOT DETECTED Final   Neisseria meningitidis NOT DETECTED NOT DETECTED Final   Pseudomonas aeruginosa NOT DETECTED NOT DETECTED Final   Candida albicans NOT DETECTED NOT DETECTED Final   Candida glabrata NOT DETECTED NOT DETECTED Final   Candida krusei NOT DETECTED NOT DETECTED Final   Candida parapsilosis NOT DETECTED NOT DETECTED Final   Candida tropicalis NOT DETECTED NOT DETECTED Final     Comment: Performed at [facility], [address] [place], [place] [address]  Blood culture (routine x 2)     Status: Abnormal   Collection Time: 05/06/17  8:58 PM  Result Value Ref Range Status   Specimen Description BLOOD RIGHT HAND  Final   Special Requests   Final  BOTTLES DRAWN AEROBIC ONLY Blood Culture results may not be optimal due to an inadequate volume of blood received in culture bottles   Culture  Setup Time   Final    GRAM POSITIVE COCCI AEROBIC BOTTLE ONLY CRITICAL RESULT CALLED TO, READ BACK BY AND VERIFIED WITHCindra Presume: H BAIRD [facility] 2103 05/07/17 A BROWNING Performed at [facility], [address] [place], [place] [address]    Culture STAPHYLOCOCCUS SPECIES (COAGULASE NEGATIVE) (A)  Final   Report Status 05/09/2017 FINAL  Final   Organism ID, Bacteria STAPHYLOCOCCUS SPECIES (COAGULASE NEGATIVE)  Final      Susceptibility   Staphylococcus species (coagulase negative) - MIC*    CIPROFLOXACIN <=0.5 SENSITIVE Sensitive     ERYTHROMYCIN >=8 RESISTANT Resistant     GENTAMICIN <=0.5 SENSITIVE Sensitive     OXACILLIN >=4 RESISTANT Resistant     TETRACYCLINE >=16 RESISTANT Resistant     VANCOMYCIN <=0.5 SENSITIVE Sensitive     TRIMETH/SULFA 80 RESISTANT Resistant     CLINDAMYCIN 0.5 SENSITIVE Sensitive     RIFAMPIN <=0.5 SENSITIVE Sensitive     Inducible Clindamycin NEGATIVE Sensitive     * STAPHYLOCOCCUS SPECIES (COAGULASE NEGATIVE)  Urine culture     Status: None   Collection Time: 05/06/17  9:21 PM  Result Value Ref Range Status   Specimen Description URINE, CATHETERIZED  Final   Special Requests NONE  Final   Culture   Final    NO GROWTH Performed at [facility], [address] [place], [place] [address]    Report Status 05/08/2017 FINAL  Final  MRSA PCR Screening     Status: None   Collection Time: 05/08/17  2:26 AM  Result Value Ref Range Status   MRSA by PCR NEGATIVE NEGATIVE Final    Comment:        The GeneXpert MRSA Assay (FDA approved  for NASAL specimens only), is one component of a comprehensive MRSA colonization surveillance program. It is not intended to diagnose MRSA infection nor to guide or monitor treatment for MRSA infections. Performed at [facility], [address] [place], [place] [address]          Radiology Studies: Ct Abdomen Pelvis Wo Contrast  Result Date: 05/07/2017 CLINICAL DATA:  Abdominal distension EXAM: CT ABDOMEN AND PELVIS WITHOUT CONTRAST TECHNIQUE: Multidetector CT imaging of the abdomen and pelvis was performed following the standard protocol without IV contrast. COMPARISON:  CT abdomen pelvis 02/22/2017, CT chest 05/06/2017 FINDINGS: Lower chest: Lung bases demonstrate trace right pleural effusion with development of patchy consolidation at the right lung base. Trace pericardial effusion. Hepatobiliary: No focal hepatic abnormality. Layering sludge or small stones in the gallbladder. No biliary dilatation Pancreas: Unremarkable. No pancreatic ductal dilatation or surrounding inflammatory changes. Spleen: Normal in size without focal abnormality. Adrenals/Urinary Tract: Adrenal glands are within normal limits. No hydronephrosis. Patchy hyperdensity within the right renal cortex with small amount of excreted contrast in the collecting systems. Residual contrast within the urinary bladder. Stomach/Bowel: Gastrostomy tube in the body of the stomach. Stomach is decompressed. Dilated segment of small bowel in the right abdomen measuring up to 4.6 cm. Abrupt transition two decompressed caliber small bowel in the central pelvis, consistent with bowel obstruction. Moderate retained feces in the rectum. Mild colon diverticular disease. Small amount of radiopaque material within the distal colon. Vascular/Lymphatic: Moderate aortic atherosclerosis. No aneurysmal dilatation. No significantly enlarged lymph nodes. Reproductive: Status post hysterectomy. No adnexal masses. Other: Negative for free air or  significant free fluid. Small hyperdense foci and gas within the subcutaneous fat of the abdominal wall anteriorly, possibly due to subcutaneous injections. Musculoskeletal: No acute or suspicious abnormality. IMPRESSION: 1. Focally dilated segment of small bowel within the right abdomen with abrupt transition to decompressed distal small bowel in the central upper pelvis with decompressed distal small bowel and colon; overall pattern favors a mechanical small bowel obstruction. Given the focal appearance and right-sided distribution of the dilated small bowel, could consider closed loop obstruction although no definite mesenteric swirling or mesenteric vascular crowding is identified. Negative for intramural air or free air. 2. Development of trace right pleural effusion with consolidation at the right base which may reflect atelectasis or pneumonia 3. Patchy increased cortical density in the right kidney, suspect for delayed nephrogram as may be seen with compromised renal function. 4. Layering stones or sludge within the gallbladder. Electronically Signed   By: Jasmine Pang M.D.   On: 05/07/2017 15:24   Dg Abd Portable 1v-small Bowel Obstruction Protocol-initial, 8 Hr Delay  Result Date: 05/08/2017 CLINICAL DATA:  Small bowel obstruction, 8 hour delayed film. EXAM: PORTABLE ABDOMEN - 1 VIEW COMPARISON:  CT yesterday FINDINGS: Administered enteric contrast visualized throughout the ascending, transverse, and to lesser extent descending colon. Decreased gaseous distention of small bowel from prior exam, with persistent dilated small bowel loops centrally in the upper abdomen. No evidence of free air. Elevated right hemidiaphragm. Gastrostomy tube projects over the upper abdomen. IMPRESSION: Administered enteric contrast within the ascending, transverse, and to a lesser extent descending colon. Improving gaseous small bowel distention with persistent dilated small bowel loop in the upper abdomen. Electronically  Signed   By: Rubye Oaks M.D.   On: 05/08/2017 03:05        Scheduled Meds: . acidophilus  1 capsule Oral BID  . bethanechol  10 mg Per Tube TID  . chlorhexidine  15 mL Mouth Rinse BID  . enoxaparin  60 mg Subcutaneous Q12H  . famotidine  20 mg Per Tube BID  . free water  200 mL Per Tube Q8H  . insulin aspart  0-9 Units Subcutaneous Q4H  . levETIRAcetam  500 mg Per Tube BID  . mouth rinse  15 mL Mouth Rinse q12n4p  . multivitamin with minerals  1 tablet Per Tube Daily  . pantoprazole (PROTONIX) IV  40 mg Intravenous Q24H  . potassium chloride  40 mEq Per Tube BID  . rosuvastatin  20 mg Per Tube q1800  . sodium chloride flush  3 mL Intravenous Q12H   Continuous Infusions: . dextrose 1,000 mL (05/08/17 1608)  . feeding supplement (OSMOLITE 1.2 CAL) 1,000 mL (05/08/17 1603)  . fluconazole (DIFLUCAN) IV Stopped (05/09/17 1415)  . piperacillin-tazobactam (ZOSYN)  IV Stopped (05/09/17 1308)  . vancomycin 750 mg (05/09/17 1421)     LOS: 3 days    Time spent: 35 minutes.     Alba Cory, MD [facility] Pager [phone]  If 7PM-7AM, please contact night-coverage [email or web address] Password [email or web address] 05/09/2017, 2:42 PM

## 2017-05-09 NOTE — Progress Notes (Signed)
Nutrition Follow-up  DOCUMENTATION CODES:   Not applicable  INTERVENTION:  Advance Osmolite 1.2 by 10 every 8 hours to goal rate of 3455mL/hr  Pro-stat 30mL daily  At goal, provides 1684 calories, 88gm protein, 1069mL free water  Replete K+ (3.0)  NUTRITION DIAGNOSIS:   Inadequate oral intake related to acute illness as evidenced by NPO status. -ongoing  GOAL:   Patient will meet greater than or equal to 90% of their needs -progressing  MONITOR:   TF tolerance, Labs, Weight trends, Skin  ASSESSMENT:   52 yo female admitted with sepsis, possible SBO, AKI with hypernatremia; pt with hx of hemorrhagic stroke with hemiparesis and dysphagia requiring PEG tube, DM, seizure disorder, DVT/PE. Pt is bed bound, nonverbal and PEG dependent at baseline  Deemed ok to start diet per surgery. Rachel Vang is resting comfortably today, no nausea or complaints of abdominal pain. Discussed with colleagues, ok to advance tubefeed slowly to goal.  Labs reviewed:  CBGs [phone]  Medications reviewed and include:  Acidophilus, Insulin, 40K+ BID, MVI w/ Minerals D5 at 17100mL/hr --> 408 calories  Diet Order:  Diet NPO time specified  EDUCATION NEEDS:   No education needs have been identified at this time  Skin:  Skin Assessment: Reviewed RN Assessment  Last BM:  4/10  Height:   Ht Readings from Last 1 Encounters:  05/06/17 5' (1.524 m)    Weight:   Wt Readings from Last 1 Encounters:  05/09/17 138 lb 0.1 oz (62.6 kg)    Ideal Body Weight:     BMI:  Body mass index is 26.95 kg/m.  Estimated Nutritional Needs:   Kcal:  1525-1775 kcals  Protein:  73-89 g  Fluid:  >/= 1.5 L  Dionne AnoWilliam M. Dominico Rod, MS, RD LDN Inpatient Clinical Dietitian Pager [phone]

## 2017-05-09 NOTE — Consult Note (Signed)
Date of Admission:  05/06/2017          Reason for Consult: Coagulase negative staphylococcal bacteremia   Referring Provider: Dr. Sunnie Nielsen   Assessment: 1. Coagulase could have staphylococcal bacteremia 2.  Aspiration pneumonitis/pneumonia 3. 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 Continue vancomycin IV  2. Repeat blood cultures 3. Complete a minimum 2-week course of effective therapy for coagulase-negative staphylococcal species 4. Narrowed to Unasyn for aspiration and complete a course for this 5. Check plain film of the right foot and will consider more aggressive imaging of this site  Principal Problem:   Severe sepsis (HCC) Active Problems:   Seizures (HCC)   Elevated troponin   Type II diabetes mellitus with neurological manifestations (HCC)   Essential hypertension, benign   Hemiparesis affecting right side as late effect of stroke (HCC)   Chronic pulmonary embolism (HCC)   AKI (acute kidney injury) (HCC)   Hypernatremia   Scheduled Meds: . acidophilus  1 capsule Oral BID  . bethanechol  10 mg Per Tube TID  . chlorhexidine  15 mL Mouth Rinse BID  . enoxaparin  60 mg Subcutaneous Q12H  . famotidine  20 mg Per Tube BID  . free water  200 mL Per Tube Q8H  . insulin aspart  0-9 Units Subcutaneous Q4H  . levETIRAcetam  500 mg Per Tube BID  . mouth rinse  15 mL Mouth Rinse q12n4p  . multivitamin with minerals  1 tablet Per Tube Daily  . pantoprazole (PROTONIX) IV  40 mg Intravenous Q24H  . potassium chloride  40 mEq Per Tube BID  . rosuvastatin  20 mg Per Tube q1800  . sodium chloride flush  3 mL Intravenous Q12H   Continuous Infusions: . ampicillin-sulbactam (UNASYN) IV    . dextrose 1,000 mL (05/08/17 1608)  . feeding supplement (OSMOLITE 1.2 CAL)    . fluconazole (DIFLUCAN) IV Stopped (05/09/17 1415)  . vancomycin Stopped (05/09/17 1605)   PRN Meds:.acetaminophen **OR** acetaminophen  HPI: Rachel Vang is a 52 y.o. female with history of severe hemorrhagic right-sided CVA with hemiparesis, expressive aphasia and dependence on PEG tube also with comorbid diabetes mellitus and a seizure disorder as well as DVT PE who was admitted with tachypnea and tachycardia.  She had blood cultures drawn and was worked up for an infectious process.  In the interim CT scan showed diffuse bronchial thickening with debris consistent with possible aspiration on CT angiogram.  CT of the abdomen and pelvis the following day showed evidence of small bowel obstruction.  She was placed on vancomycin and Zosyn in the interim her blood cultures have become positive for coagulase-negative staphylococcal species.  She has become afebrile.  There is not a clear-cut source on exam she does have a pressure ulcer on her heel but is not deep she also has the beginnings of an skin breakdown in her sacral area but no ulceration there.  The notes from her skilled nursing facility do note that she had had some warmth about this posterior heel ulcer but is not overwhelming on exam today.  I will obtain a plain film of her right foot and we can consider more aggressive imaging.  Other than the foot there is not a clear-cut source of infection such as a PICC line or even a peripheral IV prior to admission.  Endocarditis would seem unlikely with this organism given that is a rare pathogen and culture  positive native valve endocarditis.  Prosthetic valve endocarditis is a different matter where it is more common.  I would be inclined to give her 2 weeks of IV vancomycin provided we do not shot find evidence of a deeper infection.  I am not especially anxious to get a transesophageal echocardiogram.  We will also make sure she finishes a course of antibiotics for aspiration pneumonia.   Review of Systems: Review of Systems  Unable to perform ROS: Patient nonverbal    Past Medical History:  Diagnosis Date  . Acute pulmonary  embolism (HCC) 02/19/2015  . Acute respiratory failure (HCC)   . Diabetes mellitus without complication (HCC)    Type 2, W/o complications  . DVT (deep venous thrombosis) (HCC) 04/05/2015  . Dysphagia   . Epilepsy (HCC)   . GERD (gastroesophageal reflux disease)   . Hyperlipidemia   . Hypertension   . IBS (irritable bowel syndrome)   . Nontraumatic subarachnoid hemorrhage (HCC)   . SAH (subarachnoid hemorrhage) (HCC)   . Urinary retention     Social History   Tobacco Use  . Smoking status: Former Games developermoker  . Smokeless tobacco: Never Used  Substance Use Topics  . Alcohol use: No  . Drug use: No    Family History  Problem Relation Age of Onset  . Hypertension Other    No Known Allergies  OBJECTIVE: Blood pressure (!) 139/96, pulse (!) 121, temperature 98.8 F (37.1 C), temperature source Oral, resp. rate (!) 23, height 5' (1.524 m), weight 138 lb 0.1 oz (62.6 kg), SpO2 100 %.  Physical Exam  Constitutional: No distress.  HENT:  Head: Normocephalic and atraumatic.  Nose: Nose normal.  Mouth/Throat: Oropharynx is clear and moist. No oropharyngeal exudate.  Eyes: Conjunctivae and EOM are normal. No scleral icterus.  Neck: Normal range of motion. Neck supple.  Cardiovascular: Normal rate, regular rhythm and normal heart sounds. Exam reveals no gallop and no friction rub.  No murmur heard. Pulmonary/Chest: Effort normal and breath sounds normal. No respiratory distress. She has no wheezes. She has no rales.  Abdominal: Bowel sounds are normal. She exhibits distension. There is tenderness. There is no rebound.  Musculoskeletal: She exhibits tenderness and deformity. She exhibits no edema.  Lymphadenopathy:    She has no cervical adenopathy.  Neurological: She is alert. She exhibits abnormal muscle tone. Coordination normal.  Skin: Skin is warm and dry. No rash noted. She is not diaphoretic. No erythema. No pallor.  She has right hemiparesis in leg. She has expressive  aphasia  Right leg with contracture  Heel with ulceration  PEG tube without overt purulence  Lab Results Lab Results  Component Value Date   WBC 10.0 05/09/2017   HGB 11.8 (L) 05/09/2017   HCT 37.7 05/09/2017   MCV 92.9 05/09/2017   PLT 210 05/09/2017    Lab Results  Component Value Date   CREATININE 0.77 05/09/2017   BUN 7 05/09/2017   NA 142 05/09/2017   K 3.0 (L) 05/09/2017   CL 110 05/09/2017   CO2 20 (L) 05/09/2017    Lab Results  Component Value Date   ALT 15 05/07/2017   AST 33 05/07/2017   ALKPHOS 52 05/07/2017   BILITOT 0.6 05/07/2017     Microbiology: Recent Results (from the past 240 hour(s))  Blood culture (routine x 2)     Status: Abnormal   Collection Time: 05/06/17  8:45 PM  Result Value Ref Range Status   Specimen Description BLOOD LEFT ARM  Final   Special Requests   Final    BOTTLES DRAWN AEROBIC AND ANAEROBIC Blood Culture adequate volume   Culture  Setup Time   Final    GRAM POSITIVE COCCI ANAEROBIC BOTTLE ONLY Organism ID to follow CRITICAL RESULT CALLED TO, READ BACK BY AND VERIFIED WITH: H BAIRD PHARMD 2103 05/07/17 A BROWNING    Culture (A)  Final    STAPHYLOCOCCUS SPECIES (COAGULASE NEGATIVE) SUSCEPTIBILITIES PERFORMED ON PREVIOUS CULTURE WITHIN THE LAST 5 DAYS. Performed at [facility], [address] [place], [place] [address]    Report Status 05/09/2017 FINAL  Final  Blood Culture ID Panel (Reflexed)     Status: Abnormal   Collection Time: 05/06/17  8:45 PM  Result Value Ref Range Status   Enterococcus species NOT DETECTED NOT DETECTED Final   Listeria monocytogenes NOT DETECTED NOT DETECTED Final   Staphylococcus species DETECTED (A) NOT DETECTED Final    Comment: Methicillin (oxacillin) resistant coagulase negative staphylococcus. Possible blood culture contaminant (unless isolated from more than one blood culture draw or clinical case suggests pathogenicity). No antibiotic treatment is indicated for blood  culture  contaminants. CRITICAL RESULT CALLED TO, READ BACK BY AND VERIFIED WITH: H BAIRD PHARMD 2103 05/07/17 A BROWNING    Staphylococcus aureus NOT DETECTED NOT DETECTED Final   Methicillin resistance DETECTED (A) NOT DETECTED Final    Comment: CRITICAL RESULT CALLED TO, READ BACK BY AND VERIFIED WITH: H BAIRD PHARMD 2103 05/07/17 A BROWNING    Streptococcus species NOT DETECTED NOT DETECTED Final   Streptococcus agalactiae NOT DETECTED NOT DETECTED Final   Streptococcus pneumoniae NOT DETECTED NOT DETECTED Final   Streptococcus pyogenes NOT DETECTED NOT DETECTED Final   Acinetobacter baumannii NOT DETECTED NOT DETECTED Final   Enterobacteriaceae species NOT DETECTED NOT DETECTED Final   Enterobacter cloacae complex NOT DETECTED NOT DETECTED Final   Escherichia coli NOT DETECTED NOT DETECTED Final   Klebsiella oxytoca NOT DETECTED NOT DETECTED Final   Klebsiella pneumoniae NOT DETECTED NOT DETECTED Final   Proteus species NOT DETECTED NOT DETECTED Final   Serratia marcescens NOT DETECTED NOT DETECTED Final   Haemophilus influenzae NOT DETECTED NOT DETECTED Final   Neisseria meningitidis NOT DETECTED NOT DETECTED Final   Pseudomonas aeruginosa NOT DETECTED NOT DETECTED Final   Candida albicans NOT DETECTED NOT DETECTED Final   Candida glabrata NOT DETECTED NOT DETECTED Final   Candida krusei NOT DETECTED NOT DETECTED Final   Candida parapsilosis NOT DETECTED NOT DETECTED Final   Candida tropicalis NOT DETECTED NOT DETECTED Final    Comment: Performed at [facility], [address], [place], [place] [address]  Blood culture (routine x 2)     Status: Abnormal   Collection Time: 05/06/17  8:58 PM  Result Value Ref Range Status   Specimen Description BLOOD RIGHT HAND  Final   Special Requests   Final    BOTTLES DRAWN AEROBIC ONLY Blood Culture results may not be optimal due to an inadequate volume of blood received in culture bottles   Culture  Setup Time   Final    GRAM POSITIVE  COCCI AEROBIC BOTTLE ONLY CRITICAL RESULT CALLED TO, READ BACK BY AND VERIFIED WITHCindra Presume [facility] 2103 05/07/17 A BROWNING Performed at [facility], [address], [place], [place] [address]    Culture STAPHYLOCOCCUS SPECIES (COAGULASE NEGATIVE) (A)  Final   Report Status 05/09/2017 FINAL  Final   Organism ID, Bacteria STAPHYLOCOCCUS SPECIES (COAGULASE NEGATIVE)  Final  Susceptibility   Staphylococcus species (coagulase negative) - MIC*    CIPROFLOXACIN <=0.5 SENSITIVE Sensitive     ERYTHROMYCIN >=8 RESISTANT Resistant     GENTAMICIN <=0.5 SENSITIVE Sensitive     OXACILLIN >=4 RESISTANT Resistant     TETRACYCLINE >=16 RESISTANT Resistant     VANCOMYCIN <=0.5 SENSITIVE Sensitive     TRIMETH/SULFA 80 RESISTANT Resistant     CLINDAMYCIN 0.5 SENSITIVE Sensitive     RIFAMPIN <=0.5 SENSITIVE Sensitive     Inducible Clindamycin NEGATIVE Sensitive     * STAPHYLOCOCCUS SPECIES (COAGULASE NEGATIVE)  Urine culture     Status: None   Collection Time: 05/06/17  9:21 PM  Result Value Ref Range Status   Specimen Description URINE, CATHETERIZED  Final   Special Requests NONE  Final   Culture   Final    NO GROWTH Performed at [facility], [address], [place], [place] [address]    Report Status 05/08/2017 FINAL  Final  MRSA PCR Screening     Status: None   Collection Time: 05/08/17  2:26 AM  Result Value Ref Range Status   MRSA by PCR NEGATIVE NEGATIVE Final    Comment:        The GeneXpert MRSA Assay (FDA approved for NASAL specimens only), is one component of a comprehensive MRSA colonization surveillance program. It is not intended to diagnose MRSA infection nor to guide or monitor treatment for MRSA infections. Performed at [facility], [address], [place], [place] [address]     Acey Lav, MD [facility] [phone] pager  05/09/2017, 4:45 PM

## 2017-05-10 DIAGNOSIS — M25561 Pain in right knee: Secondary | ICD-10-CM

## 2017-05-10 DIAGNOSIS — I248 Other forms of acute ischemic heart disease: Secondary | ICD-10-CM

## 2017-05-10 DIAGNOSIS — R4701 Aphasia: Secondary | ICD-10-CM

## 2017-05-10 DIAGNOSIS — Z931 Gastrostomy status: Secondary | ICD-10-CM

## 2017-05-10 DIAGNOSIS — M62461 Contracture of muscle, right lower leg: Secondary | ICD-10-CM

## 2017-05-10 DIAGNOSIS — M79671 Pain in right foot: Secondary | ICD-10-CM

## 2017-05-10 DIAGNOSIS — M25569 Pain in unspecified knee: Secondary | ICD-10-CM

## 2017-05-10 DIAGNOSIS — B957 Other staphylococcus as the cause of diseases classified elsewhere: Secondary | ICD-10-CM

## 2017-05-10 DIAGNOSIS — R471 Dysarthria and anarthria: Secondary | ICD-10-CM

## 2017-05-10 DIAGNOSIS — L89611 Pressure ulcer of right heel, stage 1: Secondary | ICD-10-CM

## 2017-05-10 DIAGNOSIS — R7881 Bacteremia: Secondary | ICD-10-CM

## 2017-05-10 DIAGNOSIS — G8929 Other chronic pain: Secondary | ICD-10-CM

## 2017-05-10 LAB — BASIC METABOLIC PANEL
ANION GAP: 11 (ref 5–15)
BUN: 6 mg/dL (ref 6–20)
CO2: 23 mmol/L (ref 22–32)
Calcium: 9.2 mg/dL (ref 8.9–10.3)
Chloride: 109 mmol/L (ref 101–111)
Creatinine, Ser: 0.87 mg/dL (ref 0.44–1.00)
GFR calc Af Amer: >60 mL/min (ref >60)
Glucose, Bld: 120 mg/dL — ABNORMAL HIGH (ref 65–99)
POTASSIUM: 3.2 mmol/L — AB (ref 3.5–5.1)
SODIUM: 143 mmol/L (ref 135–145)

## 2017-05-10 LAB — GLUCOSE, CAPILLARY
GLUCOSE-CAPILLARY: 97 mg/dL (ref 65–99)
GLUCOSE-CAPILLARY: 142 mg/dL — AB (ref 65–99)
Glucose-Capillary: 111 mg/dL — ABNORMAL HIGH (ref 65–99)
Glucose-Capillary: 125 mg/dL — ABNORMAL HIGH (ref 65–99)
Glucose-Capillary: 112 mg/dL — ABNORMAL HIGH (ref 65–99)
Glucose-Capillary: 128 mg/dL — ABNORMAL HIGH (ref 65–99)

## 2017-05-10 LAB — CBC
HCT: 35.1 % — ABNORMAL LOW (ref 36.0–46.0)
Hemoglobin: 11 g/dL — ABNORMAL LOW (ref 12.0–15.0)
MCH: 28.9 pg (ref 26.0–34.0)
MCHC: 31.3 g/dL (ref 30.0–36.0)
MCV: 92.4 fL (ref 78.0–100.0)
PLATELETS: 214 10*3/uL (ref 150–400)
RBC: 3.8 MIL/uL — AB (ref 3.87–5.11)
RDW: 14.8 % (ref 11.5–15.5)
WBC: 8.5 10*3/uL (ref 4.0–10.5)

## 2017-05-10 MED ORDER — POTASSIUM CHLORIDE 20 MEQ PO PACK
40.0000 meq | PACK | Freq: Two times a day (BID) | ORAL | Status: AC
  Administered 2017-05-10 (×2): 40 meq via ORAL
  Filled 2017-05-10 (×3): qty 2

## 2017-05-10 MED ORDER — SODIUM CHLORIDE 0.9 % IV SOLN
3.0000 g | Freq: Four times a day (QID) | INTRAVENOUS | Status: DC
  Administered 2017-05-10 – 2017-05-13 (×12): 3 g via INTRAVENOUS
  Filled 2017-05-10 (×14): qty 3

## 2017-05-10 MED ORDER — ADULT MULTIVITAMIN LIQUID CH
15.0000 mL | Freq: Every day | ORAL | Status: DC
  Administered 2017-05-11 – 2017-05-13 (×3): 15 mL
  Filled 2017-05-10 (×3): qty 15

## 2017-05-10 NOTE — Progress Notes (Signed)
[facility]  Date of Admission:  05/06/2017             ASSESSMENT/PLAN  Rachel Vang has Coag negative staph bacteremia of undetermined source. There is no discernable source as there is no osteomyelitis of right foot with little skin breakdown. Echo with no evidence of vegetation. Could have been from any break in the skin. She has remained afebrile with no leukocytosis. Repeat blood cultures are without growth to date.   1. Recommend treating with 2 weeks of Vancomycin for simple bacteremia as there is no identifiable source or osteomyelitis. 2. Recommend 5 day course of Unasyn for aspiration pneumonia. 3. SBO appears resolved per primary notes - continue per primary. 4. We will continue to follow.    Principal Problem:   Severe sepsis (HCC) Active Problems:   Sepsis (HCC)   Seizures (HCC)   Elevated troponin   Type II diabetes mellitus with neurological manifestations (HCC)   Essential hypertension, benign   Hemiparesis affecting right side as late effect of stroke (HCC)   Chronic pulmonary embolism (HCC)   AKI (acute kidney injury) (HCC)   Hypernatremia   Abdominal pain   Bacteremia   Small bowel obstruction (HCC)   SVT (supraventricular tachycardia) (HCC)   . acidophilus  1 capsule Oral BID  . bethanechol  10 mg Per Tube TID  . chlorhexidine  15 mL Mouth Rinse BID  . enoxaparin  60 mg Subcutaneous Q12H  . famotidine  20 mg Per Tube BID  . feeding supplement (PRO-STAT SUGAR FREE 64)  30 mL Per Tube Daily  . free water  200 mL Per Tube Q8H  . insulin aspart  0-9 Units Subcutaneous Q4H  . levETIRAcetam  500 mg Per Tube BID  . mouth rinse  15 mL Mouth Rinse q12n4p  . [START ON 05/11/2017] multivitamin  15 mL Per Tube Daily  . potassium chloride  40 mEq Oral BID  . rosuvastatin  20 mg Per Tube q1800  . sodium chloride flush  3 mL Intravenous Q12H    SUBJECTIVE:  Afebrile overnight. X-rays of right foot without evidence of osteomyelitis.  Repeat blood cultures from 05/09/17 with no growth to date.    No Known Allergies   Review of Systems: Review of Systems  Unable to perform ROS: Patient nonverbal      OBJECTIVE: Vitals:   05/09/17 2354 05/10/17 0402 05/10/17 0548 05/10/17 0800  BP: (!) 119/103     Pulse:    94  Resp:      Temp: 97.7 F (36.5 C) 98.2 F (36.8 C)  98.1 F (36.7 C)  TempSrc: Oral Oral  Oral  SpO2:    100%  Weight:   132 lb (59.9 kg)   Height:       Body mass index is 25.78 kg/m.  Physical Exam  Constitutional:  Lying in bed, expressive aphasia/dysarthria.   Cardiovascular: Normal rate, regular rhythm, normal heart sounds and intact distal pulses. Exam reveals no gallop and no friction rub.  No murmur heard. Pulmonary/Chest: Effort normal and breath sounds normal. No stridor. No respiratory distress. She has no wheezes. She has no rales. She exhibits no tenderness.  Abdominal: Soft. She exhibits distension. There is tenderness.  Site around PEG tube appears without evidence of infection.   Musculoskeletal:  Appears to have contractures of the right lower extremity. Grade I/II ulcer on her heel.     Lab Results Lab Results  Component Value Date  WBC 8.5 05/10/2017   HGB 11.0 (L) 05/10/2017   HCT 35.1 (L) 05/10/2017   MCV 92.4 05/10/2017   PLT 214 05/10/2017    Lab Results  Component Value Date   CREATININE 0.87 05/10/2017   BUN 6 05/10/2017   NA 143 05/10/2017   K 3.2 (L) 05/10/2017   CL 109 05/10/2017   CO2 23 05/10/2017    Lab Results  Component Value Date   ALT 15 05/07/2017   AST 33 05/07/2017   ALKPHOS 52 05/07/2017   BILITOT 0.6 05/07/2017     Microbiology: Recent Results (from the past 240 hour(s))  Blood culture (routine x 2)     Status: Abnormal   Collection Time: 05/06/17  8:45 PM  Result Value Ref Range Status   Specimen Description BLOOD LEFT ARM  Final   Special Requests   Final    BOTTLES DRAWN AEROBIC AND ANAEROBIC Blood Culture adequate volume    Culture  Setup Time   Final    GRAM POSITIVE COCCI ANAEROBIC BOTTLE ONLY Organism ID to follow CRITICAL RESULT CALLED TO, READ BACK BY AND VERIFIED WITH: H BAIRD PHARMD 2103 05/07/17 A BROWNING    Culture (A)  Final    STAPHYLOCOCCUS SPECIES (COAGULASE NEGATIVE) SUSCEPTIBILITIES PERFORMED ON PREVIOUS CULTURE WITHIN THE LAST 5 DAYS. Performed at [facility], [address] [place], [place] [address]    Report Status 05/09/2017 FINAL  Final  Blood Culture ID Panel (Reflexed)     Status: Abnormal   Collection Time: 05/06/17  8:45 PM  Result Value Ref Range Status   Enterococcus species NOT DETECTED NOT DETECTED Final   Listeria monocytogenes NOT DETECTED NOT DETECTED Final   Staphylococcus species DETECTED (A) NOT DETECTED Final    Comment: Methicillin (oxacillin) resistant coagulase negative staphylococcus. Possible blood culture contaminant (unless isolated from more than one blood culture draw or clinical case suggests pathogenicity). No antibiotic treatment is indicated for blood  culture contaminants. CRITICAL RESULT CALLED TO, READ BACK BY AND VERIFIED WITH: H BAIRD PHARMD 2103 05/07/17 A BROWNING    Staphylococcus aureus NOT DETECTED NOT DETECTED Final   Methicillin resistance DETECTED (A) NOT DETECTED Final    Comment: CRITICAL RESULT CALLED TO, READ BACK BY AND VERIFIED WITH: H BAIRD PHARMD 2103 05/07/17 A BROWNING    Streptococcus species NOT DETECTED NOT DETECTED Final   Streptococcus agalactiae NOT DETECTED NOT DETECTED Final   Streptococcus pneumoniae NOT DETECTED NOT DETECTED Final   Streptococcus pyogenes NOT DETECTED NOT DETECTED Final   Acinetobacter baumannii NOT DETECTED NOT DETECTED Final   Enterobacteriaceae species NOT DETECTED NOT DETECTED Final   Enterobacter cloacae complex NOT DETECTED NOT DETECTED Final   Escherichia coli NOT DETECTED NOT DETECTED Final   Klebsiella oxytoca NOT DETECTED NOT DETECTED Final   Klebsiella pneumoniae NOT DETECTED NOT DETECTED  Final   Proteus species NOT DETECTED NOT DETECTED Final   Serratia marcescens NOT DETECTED NOT DETECTED Final   Haemophilus influenzae NOT DETECTED NOT DETECTED Final   Neisseria meningitidis NOT DETECTED NOT DETECTED Final   Pseudomonas aeruginosa NOT DETECTED NOT DETECTED Final   Candida albicans NOT DETECTED NOT DETECTED Final   Candida glabrata NOT DETECTED NOT DETECTED Final   Candida krusei NOT DETECTED NOT DETECTED Final   Candida parapsilosis NOT DETECTED NOT DETECTED Final   Candida tropicalis NOT DETECTED NOT DETECTED Final    Comment: Performed at [facility], [address], [place], [place] [address]  Blood culture (routine x 2)  Status: Abnormal   Collection Time: 05/06/17  8:58 PM  Result Value Ref Range Status   Specimen Description BLOOD RIGHT HAND  Final   Special Requests   Final    BOTTLES DRAWN AEROBIC ONLY Blood Culture results may not be optimal due to an inadequate volume of blood received in culture bottles   Culture  Setup Time   Final    GRAM POSITIVE COCCI AEROBIC BOTTLE ONLY CRITICAL RESULT CALLED TO, READ BACK BY AND VERIFIED WITHCindra Presume [facility] 2103 05/07/17 A BROWNING Performed at [facility], [address], [place], [place] [address]    Culture STAPHYLOCOCCUS SPECIES (COAGULASE NEGATIVE) (A)  Final   Report Status 05/09/2017 FINAL  Final   Organism ID, Bacteria STAPHYLOCOCCUS SPECIES (COAGULASE NEGATIVE)  Final      Susceptibility   Staphylococcus species (coagulase negative) - MIC*    CIPROFLOXACIN <=0.5 SENSITIVE Sensitive     ERYTHROMYCIN >=8 RESISTANT Resistant     GENTAMICIN <=0.5 SENSITIVE Sensitive     OXACILLIN >=4 RESISTANT Resistant     TETRACYCLINE >=16 RESISTANT Resistant     VANCOMYCIN <=0.5 SENSITIVE Sensitive     TRIMETH/SULFA 80 RESISTANT Resistant     CLINDAMYCIN 0.5 SENSITIVE Sensitive     RIFAMPIN <=0.5 SENSITIVE Sensitive     Inducible Clindamycin NEGATIVE Sensitive     * STAPHYLOCOCCUS SPECIES (COAGULASE  NEGATIVE)  Urine culture     Status: None   Collection Time: 05/06/17  9:21 PM  Result Value Ref Range Status   Specimen Description URINE, CATHETERIZED  Final   Special Requests NONE  Final   Culture   Final    NO GROWTH Performed at [facility], [address], [place], [place] [address]    Report Status 05/08/2017 FINAL  Final  MRSA PCR Screening     Status: None   Collection Time: 05/08/17  2:26 AM  Result Value Ref Range Status   MRSA by PCR NEGATIVE NEGATIVE Final    Comment:        The GeneXpert MRSA Assay (FDA approved for NASAL specimens only), is one component of a comprehensive MRSA colonization surveillance program. It is not intended to diagnose MRSA infection nor to guide or monitor treatment for MRSA infections. Performed at [facility], [address], [place], [place] [address]   Culture, blood (Routine X 2) w Reflex to ID Panel     Status: None (Preliminary result)   Collection Time: 05/09/17  2:45 PM  Result Value Ref Range Status   Specimen Description BLOOD RIGHT ANTECUBITAL  Final   Special Requests   Final    BOTTLES DRAWN AEROBIC AND ANAEROBIC Blood Culture adequate volume   Culture   Final    NO GROWTH < 24 HOURS Performed at [facility], [address], [place], [place] [address]    Report Status PENDING  Incomplete  Culture, blood (Routine X 2) w Reflex to ID Panel     Status: None (Preliminary result)   Collection Time: 05/09/17  3:00 PM  Result Value Ref Range Status   Specimen Description BLOOD RIGHT HAND  Final   Special Requests   Final    BOTTLES DRAWN AEROBIC AND ANAEROBIC Blood Culture adequate volume   Culture   Final    NO GROWTH < 24 HOURS Performed at [facility], [address] [place], [place] [address]    Report Status PENDING  Incomplete     Tammy Sours  Carver Filaalone, NP [facility] [phone] Pager  05/10/2017  11:52 AM

## 2017-05-10 NOTE — Progress Notes (Signed)
PROGRESS NOTE    Rachel Vang  ZOX:[id number] DOB: 07-24-1965 DOA: 05/06/2017 PCP: Kirt Boys, DO    Brief Narrative:  Rachel Vang is a 52 y.o. 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  Patient is reportedly bedbound, nonverbal, and dependent on PEG tube at her baseline.  She was noted by nursing home personnel to be tachypneic and tachycardic today and EMS was called.  Patient was suspected to be in SVT and was treated with adenosine 6 mg, and then 12 mg prior to arrival in the ED.  There was no significant change in heart rate with adenosine and she was also given 500 cc of normal saline.  Patient is unable to contribute to the history due to her clinical condition.  ED Course: Upon arrival to the ED, patient is found to be febrile to 39.9 C, tachycardic to 170, tachypneic, and slightly hypotensive.  EKG features supraventricular tachycardia with rate 171 and chest x-ray is negative for acute cardiopulmonary disease.  Chemistry panel is notable for a sodium of 154, BUN 68, and creatinine 2.68, up from 0.57 in January.  CBC features a leukocytosis to 16,700 and lactic acid is elevated to 3.27.  Troponin is elevated to 0.21.  Urinalysis is unremarkable.  CTA chest is negative for PE, but notable for diffuse peribronchial thickening with debris in the bronchi, concerning for possible acute bronchitis or aspiration.  Blood and urine cultures were collected in the ED, 4.75 L of normal saline was given, and the patient was started on Rocephin and azithromycin.  PCCM was consulted by the ED physician and recommended a medical admission to the stepdown unit.  Heart rate has improved with the IV fluids, MAP remains greater than 65, and the patient will be admitted  to the stepdown unit for ongoing evaluation and management of severe sepsis suspected secondary to a pulmonary source.    Assessment & Plan:   Principal Problem:   Severe sepsis (HCC) Active Problems:   Sepsis (HCC)   Seizures (HCC)   Elevated troponin   Type II diabetes mellitus with neurological manifestations (HCC)   Essential hypertension, benign   Hemiparesis affecting right side as late effect of stroke (HCC)   Chronic pulmonary embolism (HCC)   AKI (acute kidney injury) (HCC)   Hypernatremia   Abdominal pain   Bacteremia   Small bowel obstruction (HCC)   SVT (supraventricular tachycardia) (HCC)  1-Sepsis; presents with tachycardia, tachypnea, febrile, leukocytosis.  CT angio notable for diffuse peribronchial thickening concerning for bronchitis or aspiration.  Treat for PNA.  Treated with with Vancomycin and Zosyn on admission. Follow WBC>  Treating for aspiration PNA. Change antibiotics to Unasyn.  Day 4 antibiotics.   SBO; Abdominal pain, constipation.  KUB ileus, unable to rule out obstruction.  Will hold robunil.  Ct with possible  small bowel mechanical obstruction. Sx consulted.  Appreciate sx evaluation. Underwent SBO protocol. Had large BM. Tolerating tube feeding.   Bacteremia; staph;  2 blood culture. positive Continue with vancomycin.  ID consulted.  ECHO no vegetation.  Repeated blood culture no growth.  Needs 2 weeks IV antibiotics.  Plan to get MRI ankle.   AKI; SCr is 2.68 on admission, up from 0.57 in January 2019   Suspect related to infection, hypovolemia. Received IV bolus.  Continue with  IV fluids.  Improved.   Hypokalemia; replete orally  Hypernatremia; Stop IV fluids.  Resolved.   SVT; suspect related to acute illness, fever.  Resolved with fluids.  Mg normal.    Elevated troponin; Suspect related to demand ischemia, SVT , infection.  ECHO. Normal ef, mild pericardial effusion.    History of cva; history of seizure;  continue with keppra.   Type 2 Diabetes;  SSI.   Seizure disorder; on keppra  History of DVT, PE; on lovenox.   Oral candidiasis; continue with IV diflucan.     DVT prophylaxis: lovenox Code Status: full code Family Communication; no family at bedside.  Disposition Plan:  Back to SNF when stable  Consultants:   none   Procedures:  Echo pending.    Antimicrobials:   Vancomycin and zosyn 4-08   Subjective: Denies pain, breathing ok.   Objective: Vitals:   05/10/17 0548 05/10/17 0800 05/10/17 1219 05/10/17 1415  BP:    (!) 133/95  Pulse:  94  91  Resp:    (!) 25  Temp:  98.1 F (36.7 C) 98.1 F (36.7 C) 98.9 F (37.2 C)  TempSrc:  Oral Oral Oral  SpO2:  100%  100%  Weight: 59.9 kg (132 lb)     Height:        Intake/Output Summary (Last 24 hours) at 05/10/2017 1518 Last data filed at 05/09/2017 1917 Gross per 24 hour  Intake 1532 ml  Output 750 ml  Net 782 ml   Filed Weights   05/08/17 0231 05/09/17 0644 05/10/17 0548  Weight: 61.2 kg (134 lb 14.7 oz) 62.6 kg (138 lb 0.1 oz) 59.9 kg (132 lb)    Examination:  General exam: NAD Respiratory system: Bilateral ronchus.  Cardiovascular system: S 1, S 2 RRR Gastrointestinal system: Soft, nt, peg tube in place.  Central nervous system; alert, aphasia Extremities: no edema  Skin; no rash    Data Reviewed: I have personally reviewed following labs and imaging studies  CBC: Recent Labs  Lab 05/06/17 2023 05/08/17 0242 05/09/17 0555 05/10/17 0633  WBC 16.7* 16.6* 10.0 8.5  NEUTROABS 13.7*  --   --   --   HGB 14.5 11.1* 11.8* 11.0*  HCT 47.9* 36.8 37.7 35.1*  MCV 96.8 96.6 92.9 92.4  PLT 306 198 210 214   Basic Metabolic Panel: Recent Labs  Lab 05/06/17 2023 05/07/17 0041 05/08/17 0242 05/09/17 0555 05/10/17 0633  NA 154* 158* 152* 142 143  K 3.5 4.7 2.4* 3.0* 3.2*  CL 123* 128* 117* 110 109  CO2 21* 20* 24 20* 23  GLUCOSE 251* 109* 132* 121* 120*  BUN 68* 60* 20 7 6   CREATININE  2.68* 2.10* 1.07* 0.77 0.87  CALCIUM 9.1 7.4* 8.7* 9.0 9.2  MG  --   --  2.0  --   --    GFR: Estimated Creatinine Clearance: 59.9 mL/min (by C-G formula based on SCr of 0.87 mg/dL). Liver Function Tests: Recent Labs  Lab 05/07/17 0041  AST 33  ALT 15  ALKPHOS 52  BILITOT 0.6  PROT 5.3*  ALBUMIN 2.3*   No results for input(s): LIPASE, AMYLASE in the last 168 hours. No results for input(s): AMMONIA in the last 168 hours. Coagulation Profile: Recent Labs  Lab 05/07/17 0041  INR 1.64   Cardiac Enzymes: Recent Labs  Lab 05/07/17 0041 05/07/17 0605 05/07/17 1206  TROPONINI 0.20* 0.14* 0.06*   BNP (last 3 results) No results for input(s): PROBNP in the last 8760  hours. HbA1C: No results for input(s): HGBA1C in the last 72 hours. CBG: Recent Labs  Lab 05/09/17 2154 05/09/17 2352 05/10/17 0401 05/10/17 0745 05/10/17 1216  GLUCAP 108* 111* 97 142* 125*   Lipid Profile: No results for input(s): CHOL, HDL, LDLCALC, TRIG, CHOLHDL, LDLDIRECT in the last 72 hours. Thyroid Function Tests: No results for input(s): TSH, T4TOTAL, FREET4, T3FREE, THYROIDAB in the last 72 hours. Anemia Panel: No results for input(s): VITAMINB12, FOLATE, FERRITIN, TIBC, IRON, RETICCTPCT in the last 72 hours. Sepsis Labs: Recent Labs  Lab 05/06/17 2102 05/06/17 2332 05/07/17 0041  PROCALCITON  --   --  6.18  LATICACIDVEN 3.27* 0.87 1.3    Recent Results (from the past 240 hour(s))  Blood culture (routine x 2)     Status: Abnormal   Collection Time: 05/06/17  8:45 PM  Result Value Ref Range Status   Specimen Description BLOOD LEFT ARM  Final   Special Requests   Final    BOTTLES DRAWN AEROBIC AND ANAEROBIC Blood Culture adequate volume   Culture  Setup Time   Final    GRAM POSITIVE COCCI ANAEROBIC BOTTLE ONLY Organism ID to follow CRITICAL RESULT CALLED TO, READ BACK BY AND VERIFIED WITH: H BAIRD PHARMD 2103 05/07/17 A BROWNING    Culture (A)  Final    STAPHYLOCOCCUS SPECIES  (COAGULASE NEGATIVE) SUSCEPTIBILITIES PERFORMED ON PREVIOUS CULTURE WITHIN THE LAST 5 DAYS. Performed at [facility], [address] [place], [place] [address]    Report Status 05/09/2017 FINAL  Final  Blood Culture ID Panel (Reflexed)     Status: Abnormal   Collection Time: 05/06/17  8:45 PM  Result Value Ref Range Status   Enterococcus species NOT DETECTED NOT DETECTED Final   Listeria monocytogenes NOT DETECTED NOT DETECTED Final   Staphylococcus species DETECTED (A) NOT DETECTED Final    Comment: Methicillin (oxacillin) resistant coagulase negative staphylococcus. Possible blood culture contaminant (unless isolated from more than one blood culture draw or clinical case suggests pathogenicity). No antibiotic treatment is indicated for blood  culture contaminants. CRITICAL RESULT CALLED TO, READ BACK BY AND VERIFIED WITH: H BAIRD PHARMD 2103 05/07/17 A BROWNING    Staphylococcus aureus NOT DETECTED NOT DETECTED Final   Methicillin resistance DETECTED (A) NOT DETECTED Final    Comment: CRITICAL RESULT CALLED TO, READ BACK BY AND VERIFIED WITH: H BAIRD PHARMD 2103 05/07/17 A BROWNING    Streptococcus species NOT DETECTED NOT DETECTED Final   Streptococcus agalactiae NOT DETECTED NOT DETECTED Final   Streptococcus pneumoniae NOT DETECTED NOT DETECTED Final   Streptococcus pyogenes NOT DETECTED NOT DETECTED Final   Acinetobacter baumannii NOT DETECTED NOT DETECTED Final   Enterobacteriaceae species NOT DETECTED NOT DETECTED Final   Enterobacter cloacae complex NOT DETECTED NOT DETECTED Final   Escherichia coli NOT DETECTED NOT DETECTED Final   Klebsiella oxytoca NOT DETECTED NOT DETECTED Final   Klebsiella pneumoniae NOT DETECTED NOT DETECTED Final   Proteus species NOT DETECTED NOT DETECTED Final   Serratia marcescens NOT DETECTED NOT DETECTED Final   Haemophilus influenzae NOT DETECTED NOT DETECTED Final   Neisseria meningitidis NOT DETECTED NOT DETECTED Final   Pseudomonas  aeruginosa NOT DETECTED NOT DETECTED Final   Candida albicans NOT DETECTED NOT DETECTED Final   Candida glabrata NOT DETECTED NOT DETECTED Final   Candida krusei NOT DETECTED NOT DETECTED Final   Candida parapsilosis NOT DETECTED NOT DETECTED Final   Candida tropicalis NOT DETECTED NOT DETECTED Final    Comment: Performed at  [facility], [address] [place], [place] [address]  Blood culture (routine x 2)     Status: Abnormal   Collection Time: 05/06/17  8:58 PM  Result Value Ref Range Status   Specimen Description BLOOD RIGHT HAND  Final   Special Requests   Final    BOTTLES DRAWN AEROBIC ONLY Blood Culture results may not be optimal due to an inadequate volume of blood received in culture bottles   Culture  Setup Time   Final    GRAM POSITIVE COCCI AEROBIC BOTTLE ONLY CRITICAL RESULT CALLED TO, READ BACK BY AND VERIFIED WITHCindra Presume [facility] 2103 05/07/17 A BROWNING Performed at [facility], [address], [place], [place] [address]    Culture STAPHYLOCOCCUS SPECIES (COAGULASE NEGATIVE) (A)  Final   Report Status 05/09/2017 FINAL  Final   Organism ID, Bacteria STAPHYLOCOCCUS SPECIES (COAGULASE NEGATIVE)  Final      Susceptibility   Staphylococcus species (coagulase negative) - MIC*    CIPROFLOXACIN <=0.5 SENSITIVE Sensitive     ERYTHROMYCIN >=8 RESISTANT Resistant     GENTAMICIN <=0.5 SENSITIVE Sensitive     OXACILLIN >=4 RESISTANT Resistant     TETRACYCLINE >=16 RESISTANT Resistant     VANCOMYCIN <=0.5 SENSITIVE Sensitive     TRIMETH/SULFA 80 RESISTANT Resistant     CLINDAMYCIN 0.5 SENSITIVE Sensitive     RIFAMPIN <=0.5 SENSITIVE Sensitive     Inducible Clindamycin NEGATIVE Sensitive     * STAPHYLOCOCCUS SPECIES (COAGULASE NEGATIVE)  Urine culture     Status: None   Collection Time: 05/06/17  9:21 PM  Result Value Ref Range Status   Specimen Description URINE, CATHETERIZED  Final   Special Requests NONE  Final   Culture   Final    NO GROWTH Performed at  [facility], [address], [place], [place] [address]    Report Status 05/08/2017 FINAL  Final  MRSA PCR Screening     Status: None   Collection Time: 05/08/17  2:26 AM  Result Value Ref Range Status   MRSA by PCR NEGATIVE NEGATIVE Final    Comment:        The GeneXpert MRSA Assay (FDA approved for NASAL specimens only), is one component of a comprehensive MRSA colonization surveillance program. It is not intended to diagnose MRSA infection nor to guide or monitor treatment for MRSA infections. Performed at [facility], [address], [place], [place] [address]   Culture, blood (Routine X 2) w Reflex to ID Panel     Status: None (Preliminary result)   Collection Time: 05/09/17  2:45 PM  Result Value Ref Range Status   Specimen Description BLOOD RIGHT ANTECUBITAL  Final   Special Requests   Final    BOTTLES DRAWN AEROBIC AND ANAEROBIC Blood Culture adequate volume   Culture   Final    NO GROWTH < 24 HOURS Performed at [facility], [address], [place], [place] [address]    Report Status PENDING  Incomplete  Culture, blood (Routine X 2) w Reflex to ID Panel     Status: None (Preliminary result)   Collection Time: 05/09/17  3:00 PM  Result Value Ref Range Status   Specimen Description BLOOD RIGHT HAND  Final   Special Requests   Final    BOTTLES DRAWN AEROBIC AND ANAEROBIC Blood Culture adequate volume   Culture   Final    NO GROWTH < 24 HOURS Performed at [facility], [address] [place], [place] [address]    Report Status PENDING  Incomplete         Radiology Studies: Dg Foot Complete Right  Result Date: 05/09/2017 CLINICAL DATA:  Bacteremia. EXAM: RIGHT FOOT COMPLETE - 3+ VIEW COMPARISON:  None. FINDINGS: The bones are osteoporotic. Mild hammertoe deformity of the digits. No evidence of bony destructive change or periosteal reaction. No fracture. No radiopaque foreign body or evidence of soft tissue air. Overlying dressing  artifact about the heel. IMPRESSION: 1. No radiographic findings of osteomyelitis. 2. Osteoporosis. Electronically Signed   By: Rubye Oaks M.D.   On: 05/09/2017 21:54        Scheduled Meds: . acidophilus  1 capsule Oral BID  . bethanechol  10 mg Per Tube TID  . chlorhexidine  15 mL Mouth Rinse BID  . enoxaparin  60 mg Subcutaneous Q12H  . famotidine  20 mg Per Tube BID  . feeding supplement (PRO-STAT SUGAR FREE 64)  30 mL Per Tube Daily  . free water  200 mL Per Tube Q8H  . insulin aspart  0-9 Units Subcutaneous Q4H  . levETIRAcetam  500 mg Per Tube BID  . mouth rinse  15 mL Mouth Rinse q12n4p  . [START ON 05/11/2017] multivitamin  15 mL Per Tube Daily  . potassium chloride  40 mEq Oral BID  . rosuvastatin  20 mg Per Tube q1800  . sodium chloride flush  3 mL Intravenous Q12H   Continuous Infusions: . ampicillin-sulbactam (UNASYN) IV    . dextrose 100 mL/hr at 05/09/17 1715  . feeding supplement (OSMOLITE 1.2 CAL) 1,000 mL (05/10/17 0215)  . fluconazole (DIFLUCAN) IV Stopped (05/10/17 1348)  . vancomycin 750 mg (05/10/17 1451)     LOS: 4 days    Time spent: 35 minutes.     Alba Cory, MD [facility] Pager [phone]  If 7PM-7AM, please contact night-coverage [email or web address] Password [facility] 05/10/2017, 3:18 PM

## 2017-05-10 NOTE — Plan of Care (Signed)
No episodes of bowel incontinence since start of shift. No skin breakdown noted. Prophylactic foam dressing in place

## 2017-05-11 ENCOUNTER — Inpatient Hospital Stay: Payer: Self-pay

## 2017-05-11 LAB — BASIC METABOLIC PANEL
ANION GAP: 11 (ref 5–15)
BUN: 8 mg/dL (ref 6–20)
CALCIUM: 8.8 mg/dL — AB (ref 8.9–10.3)
CO2: 19 mmol/L — AB (ref 22–32)
Chloride: 109 mmol/L (ref 101–111)
Creatinine, Ser: 0.76 mg/dL (ref 0.44–1.00)
GFR calc Af Amer: >60 mL/min (ref >60)
GLUCOSE: 108 mg/dL — AB (ref 65–99)
Potassium: 4.2 mmol/L (ref 3.5–5.1)
Sodium: 139 mmol/L (ref 135–145)

## 2017-05-11 LAB — GLUCOSE, CAPILLARY
GLUCOSE-CAPILLARY: 121 mg/dL — AB (ref 65–99)
GLUCOSE-CAPILLARY: 117 mg/dL — AB (ref 65–99)
GLUCOSE-CAPILLARY: 111 mg/dL — AB (ref 65–99)
Glucose-Capillary: 116 mg/dL — ABNORMAL HIGH (ref 65–99)
Glucose-Capillary: 117 mg/dL — ABNORMAL HIGH (ref 65–99)
Glucose-Capillary: 119 mg/dL — ABNORMAL HIGH (ref 65–99)

## 2017-05-11 LAB — HEPATITIS B SURFACE ANTIGEN: Hepatitis B Surface Ag: NEGATIVE

## 2017-05-11 LAB — C-REACTIVE PROTEIN: CRP: 3.7 mg/dL — AB (ref <1.0)

## 2017-05-11 LAB — HEPATITIS C ANTIBODY (REFLEX)

## 2017-05-11 LAB — SEDIMENTATION RATE: SED RATE: 65 mm/h — AB (ref 0–22)

## 2017-05-11 LAB — HCV COMMENT:

## 2017-05-11 LAB — VANCOMYCIN, TROUGH: VANCOMYCIN TR: 23 ug/mL — AB (ref 15–20)

## 2017-05-11 MED ORDER — POTASSIUM CHLORIDE CRYS ER 20 MEQ PO TBCR
20.0000 meq | EXTENDED_RELEASE_TABLET | Freq: Once | ORAL | Status: AC
  Administered 2017-05-11: 20 meq via ORAL
  Filled 2017-05-11: qty 1

## 2017-05-11 MED ORDER — VANCOMYCIN HCL 500 MG IV SOLR
500.0000 mg | Freq: Two times a day (BID) | INTRAVENOUS | Status: DC
  Administered 2017-05-12 – 2017-05-13 (×2): 500 mg via INTRAVENOUS
  Filled 2017-05-11 (×4): qty 500

## 2017-05-11 MED ORDER — FLUCONAZOLE 100 MG PO TABS
100.0000 mg | ORAL_TABLET | Freq: Every day | ORAL | Status: DC
  Administered 2017-05-11 – 2017-05-13 (×3): 100 mg via ORAL
  Filled 2017-05-11 (×3): qty 1

## 2017-05-11 NOTE — Progress Notes (Addendum)
Spoke with MRI nurse, who said she will get in touch with next of kin to find out the specifics regarding the "coilings" that patient has in her body.  Also spoke with PICC team nurse requesting information regarding consent for procedure, which patient cannot give d/t neuromotor limitations. Advised that she will also attempt to get in touch with next fo kin regarding obtaining verbal consent for procedure.  Documentation in chart for next of kin verbal consent for PICC placement. Will need to be filled out and witnessed by 2 nurses upon speaking with family

## 2017-05-11 NOTE — Progress Notes (Deleted)
PROGRESS NOTE    Rachel Vang  HYQ:[id number] DOB: 04/02/1965 DOA: 05/06/2017 PCP: Kirt Boysarter, Monica, DO    Brief Narrative:  Rachel Vang is a 52 y.o. 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  Patient is reportedly bedbound, nonverbal, and dependent on PEG tube at her baseline.  She was noted by nursing home personnel to be tachypneic and tachycardic today and EMS was called.  Patient was suspected to be in SVT and was treated with adenosine 6 mg, and then 12 mg prior to arrival in the ED.  There was no significant change in heart rate with adenosine and she was also given 500 cc of normal saline.  Patient is unable to contribute to the history due to her clinical condition.  ED Course: Upon arrival to the ED, patient is found to be febrile to 39.9 C, tachycardic to 170, tachypneic, and slightly hypotensive.  EKG features supraventricular tachycardia with rate 171 and chest x-ray is negative for acute cardiopulmonary disease.  Chemistry panel is notable for a sodium of 154, BUN 68, and creatinine 2.68, up from 0.57 in January.  CBC features a leukocytosis to 16,700 and lactic acid is elevated to 3.27.  Troponin is elevated to 0.21.  Urinalysis is unremarkable.  CTA chest is negative for PE, but notable for diffuse peribronchial thickening with debris in the bronchi, concerning for possible acute bronchitis or aspiration.  Blood and urine cultures were collected in the ED, 4.75 L of normal saline was given, and the patient was started on Rocephin and azithromycin.  PCCM was consulted by the ED physician and recommended a medical admission to the stepdown unit.  Heart rate has improved with the IV fluids, MAP remains greater than 65, and the patient will be admitted  to the stepdown unit for ongoing evaluation and management of severe sepsis suspected secondary to a pulmonary source.    Assessment & Plan:   Principal Problem:   Severe sepsis (HCC) Active Problems:   Sepsis (HCC)   Seizures (HCC)   Elevated troponin   Protein-calorie malnutrition, severe (HCC)   Type II diabetes mellitus with neurological manifestations (HCC)   Essential hypertension, benign   Hemiparesis affecting right side as late effect of stroke (HCC)   Chronic pulmonary embolism (HCC)   AKI (acute kidney injury) (HCC)   Hypernatremia   Abdominal pain   Coagulase negative Staphylococcus bacteremia   Small bowel obstruction (HCC)   SVT (supraventricular tachycardia) (HCC)   Demand ischemia (HCC)   Chronic pain of right knee   Chronic heel pain, right  1-Sepsis; presents with tachycardia, tachypnea, febrile, leukocytosis.  CT angio notable for diffuse peribronchial thickening concerning for bronchitis or aspiration.  Treat for PNA.  Treated with with Vancomycin and Zosyn on admission. Follow WBC>  Treating for aspiration PNA. Change antibiotics to Unasyn.  Day 5 antibiotics.   SBO; Abdominal pain, constipation.  KUB ileus, unable to rule out obstruction.  Will hold robunil.  Ct with possible  small bowel mechanical obstruction. Sx consulted.  Appreciate sx evaluation. Underwent SBO protocol. Had large BM. Tolerating tube feeding.   Bacteremia; staph;  2 blood culture. positive Continue with vancomycin.  ID consulted.  ECHO no vegetation.  Repeated blood culture 411 no growth.  Needs 2 weeks IV antibiotics.  Plan to get MRI  ankle and Knee, await results.  Will proceed with PICC line placement.   AKI; SCr is 2.68 on admission, up from 0.57 in January 2019   Suspect related to infection, hypovolemia. Received IV bolus.  Continue with IV fluids.  Improved.   Hypokalemia; replete orally  Hypernatremia; Stop IV fluids.  Resolved.   SVT; suspect related to  acute illness, fever.  Resolved with fluids.  Mg normal.    Elevated troponin; Suspect related to demand ischemia, SVT , infection.  ECHO. Normal ef, mild pericardial effusion.    History of cva; history of seizure; continue with keppra.   Type 2 Diabetes;  SSI.   Seizure disorder; on keppra  History of DVT, PE; on lovenox.   Oral candidiasis; Change diflucan to oral.     DVT prophylaxis: lovenox Code Status: full code Family Communication; no family at bedside.  Disposition Plan:  Back to SNF if MRI knee ankle negative and after PICC line placement.   Consultants:   none   Procedures:  Echo pending.    Antimicrobials:   Vancomycin and zosyn 4-08   Subjective: Denies pain, having BM  Objective: Vitals:   05/11/17 0500 05/11/17 0503 05/11/17 0754 05/11/17 0811  BP:  121/87  124/89  Pulse:  92  88  Resp:  (!) 27  19  Temp:  98.1 F (36.7 C) 97.6 F (36.4 C) 97.8 F (36.6 C)  TempSrc:  Oral Oral Oral  SpO2:  100%  100%  Weight: 50.8 kg (112 lb)     Height:        Intake/Output Summary (Last 24 hours) at 05/11/2017 0824 Last data filed at 05/11/2017 0423 Gross per 24 hour  Intake 2858.91 ml  Output -  Net 2858.91 ml   Filed Weights   05/09/17 0644 05/10/17 0548 05/11/17 0500  Weight: 62.6 kg (138 lb 0.1 oz) 59.9 kg (132 lb) 50.8 kg (112 lb)    Examination:  General exam: NAD Respiratory system: CTA Cardiovascular system: S 1, S 2 RRR Gastrointestinal system: Soft, nt, nd Central nervous system; Alert, aphasia.  Extremities: No edema Skin; No rash.    Data Reviewed: I have personally reviewed following labs and imaging studies  CBC: Recent Labs  Lab 05/06/17 2023 05/08/17 0242 05/09/17 0555 05/10/17 0633  WBC 16.7* 16.6* 10.0 8.5  NEUTROABS 13.7*  --   --   --   HGB 14.5 11.1* 11.8* 11.0*  HCT 47.9* 36.8 37.7 35.1*  MCV 96.8 96.6 92.9 92.4  PLT 306 198 210 214   Basic Metabolic Panel: Recent Labs  Lab 05/06/17 2023  05/07/17 0041 05/08/17 0242 05/09/17 0555 05/10/17 0633  NA 154* 158* 152* 142 143  K 3.5 4.7 2.4* 3.0* 3.2*  CL 123* 128* 117* 110 109  CO2 21* 20* 24 20* 23  GLUCOSE 251* 109* 132* 121* 120*  BUN 68* 60* 20 7 6   CREATININE 2.68* 2.10* 1.07* 0.77 0.87  CALCIUM 9.1 7.4* 8.7* 9.0 9.2  MG  --   --  2.0  --   --    GFR: Estimated Creatinine Clearance: 53.1 mL/min (by C-G formula based on SCr of 0.87 mg/dL). Liver Function Tests: Recent Labs  Lab 05/07/17 0041  AST 33  ALT 15  ALKPHOS 52  BILITOT 0.6  PROT 5.3*  ALBUMIN 2.3*   No results for input(s): LIPASE, AMYLASE in the last 168 hours. No results for input(s): AMMONIA in the last 168 hours. Coagulation Profile: Recent Labs  Lab 05/07/17 0041  INR 1.64   Cardiac Enzymes: Recent Labs  Lab 05/07/17 0041 05/07/17 0605 05/07/17 1206  TROPONINI 0.20* 0.14* 0.06*   BNP (last 3 results) No results for input(s): PROBNP in the last 8760 hours. HbA1C: No results for input(s): HGBA1C in the last 72 hours. CBG: Recent Labs  Lab 05/10/17 1634 05/10/17 2011 05/11/17 0011 05/11/17 0420 05/11/17 0753  GLUCAP 112* 128* 119* 121* 117*   Lipid Profile: No results for input(s): CHOL, HDL, LDLCALC, TRIG, CHOLHDL, LDLDIRECT in the last 72 hours. Thyroid Function Tests: No results for input(s): TSH, T4TOTAL, FREET4, T3FREE, THYROIDAB in the last 72 hours. Anemia Panel: No results for input(s): VITAMINB12, FOLATE, FERRITIN, TIBC, IRON, RETICCTPCT in the last 72 hours. Sepsis Labs: Recent Labs  Lab 05/06/17 2102 05/06/17 2332 05/07/17 0041  PROCALCITON  --   --  6.18  LATICACIDVEN 3.27* 0.87 1.3    Recent Results (from the past 240 hour(s))  Blood culture (routine x 2)     Status: Abnormal   Collection Time: 05/06/17  8:45 PM  Result Value Ref Range Status   Specimen Description BLOOD LEFT ARM  Final   Special Requests   Final    BOTTLES DRAWN AEROBIC AND ANAEROBIC Blood Culture adequate volume   Culture  Setup  Time   Final    GRAM POSITIVE COCCI ANAEROBIC BOTTLE ONLY Organism ID to follow CRITICAL RESULT CALLED TO, READ BACK BY AND VERIFIED WITH: H BAIRD PHARMD 2103 05/07/17 A BROWNING    Culture (A)  Final    STAPHYLOCOCCUS SPECIES (COAGULASE NEGATIVE) SUSCEPTIBILITIES PERFORMED ON PREVIOUS CULTURE WITHIN THE LAST 5 DAYS. Performed at [facility], [address] [place], [place] [address]    Report Status 05/09/2017 FINAL  Final  Blood Culture ID Panel (Reflexed)     Status: Abnormal   Collection Time: 05/06/17  8:45 PM  Result Value Ref Range Status   Enterococcus species NOT DETECTED NOT DETECTED Final   Listeria monocytogenes NOT DETECTED NOT DETECTED Final   Staphylococcus species DETECTED (A) NOT DETECTED Final    Comment: Methicillin (oxacillin) resistant coagulase negative staphylococcus. Possible blood culture contaminant (unless isolated from more than one blood culture draw or clinical case suggests pathogenicity). No antibiotic treatment is indicated for blood  culture contaminants. CRITICAL RESULT CALLED TO, READ BACK BY AND VERIFIED WITH: H BAIRD PHARMD 2103 05/07/17 A BROWNING    Staphylococcus aureus NOT DETECTED NOT DETECTED Final   Methicillin resistance DETECTED (A) NOT DETECTED Final    Comment: CRITICAL RESULT CALLED TO, READ BACK BY AND VERIFIED WITH: H BAIRD PHARMD 2103 05/07/17 A BROWNING    Streptococcus species NOT DETECTED NOT DETECTED Final   Streptococcus agalactiae NOT DETECTED NOT DETECTED Final   Streptococcus pneumoniae NOT DETECTED NOT DETECTED Final   Streptococcus pyogenes NOT DETECTED NOT DETECTED Final   Acinetobacter baumannii NOT DETECTED NOT DETECTED Final   Enterobacteriaceae species NOT DETECTED NOT DETECTED Final   Enterobacter cloacae complex NOT DETECTED NOT DETECTED Final   Escherichia coli NOT DETECTED NOT DETECTED Final   Klebsiella oxytoca NOT DETECTED NOT DETECTED Final   Klebsiella pneumoniae NOT DETECTED NOT DETECTED Final    Proteus species NOT DETECTED NOT DETECTED Final   Serratia marcescens NOT DETECTED NOT DETECTED Final   Haemophilus influenzae NOT DETECTED NOT DETECTED Final   Neisseria meningitidis NOT DETECTED NOT DETECTED Final   Pseudomonas aeruginosa NOT DETECTED NOT DETECTED Final   Candida albicans NOT DETECTED NOT DETECTED Final   Candida glabrata NOT DETECTED  NOT DETECTED Final   Candida krusei NOT DETECTED NOT DETECTED Final   Candida parapsilosis NOT DETECTED NOT DETECTED Final   Candida tropicalis NOT DETECTED NOT DETECTED Final    Comment: Performed at [facility], [address] [place], [place] [address]  Blood culture (routine x 2)     Status: Abnormal   Collection Time: 05/06/17  8:58 PM  Result Value Ref Range Status   Specimen Description BLOOD RIGHT HAND  Final   Special Requests   Final    BOTTLES DRAWN AEROBIC ONLY Blood Culture results may not be optimal due to an inadequate volume of blood received in culture bottles   Culture  Setup Time   Final    GRAM POSITIVE COCCI AEROBIC BOTTLE ONLY CRITICAL RESULT CALLED TO, READ BACK BY AND VERIFIED WITHCindra Presume [facility] 2103 05/07/17 A BROWNING Performed at [facility], [address], [place], [place] [address]    Culture STAPHYLOCOCCUS SPECIES (COAGULASE NEGATIVE) (A)  Final   Report Status 05/09/2017 FINAL  Final   Organism ID, Bacteria STAPHYLOCOCCUS SPECIES (COAGULASE NEGATIVE)  Final      Susceptibility   Staphylococcus species (coagulase negative) - MIC*    CIPROFLOXACIN <=0.5 SENSITIVE Sensitive     ERYTHROMYCIN >=8 RESISTANT Resistant     GENTAMICIN <=0.5 SENSITIVE Sensitive     OXACILLIN >=4 RESISTANT Resistant     TETRACYCLINE >=16 RESISTANT Resistant     VANCOMYCIN <=0.5 SENSITIVE Sensitive     TRIMETH/SULFA 80 RESISTANT Resistant     CLINDAMYCIN 0.5 SENSITIVE Sensitive     RIFAMPIN <=0.5 SENSITIVE Sensitive     Inducible Clindamycin NEGATIVE Sensitive     * STAPHYLOCOCCUS SPECIES (COAGULASE NEGATIVE)    Urine culture     Status: None   Collection Time: 05/06/17  9:21 PM  Result Value Ref Range Status   Specimen Description URINE, CATHETERIZED  Final   Special Requests NONE  Final   Culture   Final    NO GROWTH Performed at [facility], [address] [place], [place] [address]    Report Status 05/08/2017 FINAL  Final  MRSA PCR Screening     Status: None   Collection Time: 05/08/17  2:26 AM  Result Value Ref Range Status   MRSA by PCR NEGATIVE NEGATIVE Final    Comment:        The GeneXpert MRSA Assay (FDA approved for NASAL specimens only), is one component of a comprehensive MRSA colonization surveillance program. It is not intended to diagnose MRSA infection nor to guide or monitor treatment for MRSA infections. Performed at [facility], [address], [place], [place] [address]   Culture, blood (Routine X 2) w Reflex to ID Panel     Status: None (Preliminary result)   Collection Time: 05/09/17  2:45 PM  Result Value Ref Range Status   Specimen Description BLOOD RIGHT ANTECUBITAL  Final   Special Requests   Final    BOTTLES DRAWN AEROBIC AND ANAEROBIC Blood Culture adequate volume   Culture   Final    NO GROWTH < 24 HOURS Performed at [facility], [address] [place], [place] [address]    Report Status PENDING  Incomplete  Culture, blood (Routine X 2) w Reflex to ID Panel     Status: None (Preliminary result)   Collection Time: 05/09/17  3:00 PM  Result Value Ref Range Status   Specimen Description BLOOD RIGHT HAND  Final  Special Requests   Final    BOTTLES DRAWN AEROBIC AND ANAEROBIC Blood Culture adequate volume   Culture   Final    NO GROWTH < 24 HOURS Performed at [facility], [address] [place], [place] [address]    Report Status PENDING  Incomplete         Radiology Studies: Dg Foot Complete Right  Result Date: 05/09/2017 CLINICAL DATA:  Bacteremia. EXAM: RIGHT FOOT COMPLETE - 3+ VIEW COMPARISON:  None.  FINDINGS: The bones are osteoporotic. Mild hammertoe deformity of the digits. No evidence of bony destructive change or periosteal reaction. No fracture. No radiopaque foreign body or evidence of soft tissue air. Overlying dressing artifact about the heel. IMPRESSION: 1. No radiographic findings of osteomyelitis. 2. Osteoporosis. Electronically Signed   By: Rubye Oaks M.D.   On: 05/09/2017 21:54        Scheduled Meds: . acidophilus  1 capsule Oral BID  . bethanechol  10 mg Per Tube TID  . chlorhexidine  15 mL Mouth Rinse BID  . enoxaparin  60 mg Subcutaneous Q12H  . famotidine  20 mg Per Tube BID  . feeding supplement (PRO-STAT SUGAR FREE 64)  30 mL Per Tube Daily  . free water  200 mL Per Tube Q8H  . insulin aspart  0-9 Units Subcutaneous Q4H  . levETIRAcetam  500 mg Per Tube BID  . mouth rinse  15 mL Mouth Rinse q12n4p  . multivitamin  15 mL Per Tube Daily  . rosuvastatin  20 mg Per Tube q1800  . sodium chloride flush  3 mL Intravenous Q12H   Continuous Infusions: . ampicillin-sulbactam (UNASYN) IV Stopped (05/11/17 0525)  . feeding supplement (OSMOLITE 1.2 CAL) 1,000 mL (05/11/17 0138)  . fluconazole (DIFLUCAN) IV Stopped (05/10/17 1348)  . vancomycin Stopped (05/11/17 0238)     LOS: 5 days    Time spent: 35 minutes.     Alba Cory, MD [facility] Pager [phone]  If 7PM-7AM, please contact night-coverage [email or web address] Password [email or web address] 05/11/2017, 8:24 AM

## 2017-05-11 NOTE — Progress Notes (Signed)
Pharmacy Antibiotic Note  Rachel Vang is a 52 y.o. female admitted on 05/06/2017 PNA and CoNS Bacteremia. Oral candidiasis. Aspiration pneumonitis/pneumonia. Afebrile. WBC 8.5 down; LA 3.27>1.3. Scr WNL. Plan 2 wks vanc for bacteremia Vancomycin trough = 23, slightly supratherapeutic. Scr 0.76 today, stable. RN already gave 1400 dose   Plan: -Decrease Vancomycin to 500mg  IV q12h next dose 4/14 @ 2000 -f/u unasyn stop date (4/16 - 7 days for PNA coverage?)   Height: 5' (152.4 cm) Weight: 112 lb (50.8 kg) IBW/kg (Calculated) : 45.5  Temp (24hrs), Avg:98.1 F (36.7 C), Min:97.6 F (36.4 C), Max:98.5 F (36.9 C)  Recent Labs  Lab 05/06/17 2023 05/06/17 2102 05/06/17 2332 05/07/17 0041 05/08/17 0242 05/09/17 0555 05/10/17 [id number] 05/11/17 0635 05/11/17 1313  WBC 16.7*  --   --   --  16.6* 10.0 8.5  --   --   CREATININE 2.68*  --   --  2.10* 1.07* 0.77 0.87 0.76  --   LATICACIDVEN  --  3.27* 0.87 1.3  --   --   --   --   --   VANCOTROUGH  --   --   --   --   --   --   --   --  23*    Estimated Creatinine Clearance: 57.7 mL/min (by C-G formula based on SCr of 0.76 mg/dL).    No Known Allergies  Antimicrobials this admission: azithromycin 4/8x1 ceftriaxone 4/8 x1 vancomycin 4/8> Zosyn 4/8>4/11 Fluconazole 4/9> Amp/sulbactam 4/11>  Dose adjustments this admission: 4/13: VT = 23 on 750 Q 12    Microbiology results: 4/8 urine: negative 4/8 blood: 2/2 CoNS (BCID MRSE) 4/9 MRSA pcr negative  Thank you for allowing pharmacy to be a part of this patient's care.  Bayard HuggerMei Aitana Burry, PharmD, BCPS  Clinical Pharmacist  Pager: [phone]   05/11/2017 2:35 PM

## 2017-05-11 NOTE — Progress Notes (Signed)
PROGRESS NOTE    Rachel MinksStephanie N Philipp  ZOX:[id number] DOB: 04/07/1965 DOA: 05/06/2017 PCP: Kirt Boysarter, Monica, DO    Brief Narrative:  Rachel Vang is a 52 y.o. 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  Patient is reportedly bedbound, nonverbal, and dependent on PEG tube at her baseline.  She was noted by nursing home personnel to be tachypneic and tachycardic today and EMS was called.  Patient was suspected to be in SVT and was treated with adenosine 6 mg, and then 12 mg prior to arrival in the ED.  There was no significant change in heart rate with adenosine and she was also given 500 cc of normal saline.  Patient is unable to contribute to the history due to her clinical condition.  ED Course: Upon arrival to the ED, patient is found to be febrile to 39.9 C, tachycardic to 170, tachypneic, and slightly hypotensive.  EKG features supraventricular tachycardia with rate 171 and chest x-ray is negative for acute cardiopulmonary disease.  Chemistry panel is notable for a sodium of 154, BUN 68, and creatinine 2.68, up from 0.57 in January.  CBC features a leukocytosis to 16,700 and lactic acid is elevated to 3.27.  Troponin is elevated to 0.21.  Urinalysis is unremarkable.  CTA chest is negative for PE, but notable for diffuse peribronchial thickening with debris in the bronchi, concerning for possible acute bronchitis or aspiration.  Blood and urine cultures were collected in the ED, 4.75 L of normal saline was given, and the patient was started on Rocephin and azithromycin.  PCCM was consulted by the ED physician and recommended a medical admission to the stepdown unit.  Heart rate has improved with the IV fluids, MAP remains greater than 65, and the patient will be admitted  to the stepdown unit for ongoing evaluation and management of severe sepsis suspected secondary to a pulmonary source.    Assessment & Plan:   Principal Problem:   Severe sepsis (HCC) Active Problems:   Sepsis (HCC)   Seizures (HCC)   Elevated troponin   Protein-calorie malnutrition, severe (HCC)   Type II diabetes mellitus with neurological manifestations (HCC)   Essential hypertension, benign   Hemiparesis affecting right side as late effect of stroke (HCC)   Chronic pulmonary embolism (HCC)   AKI (acute kidney injury) (HCC)   Hypernatremia   Abdominal pain   Coagulase negative Staphylococcus bacteremia   Small bowel obstruction (HCC)   SVT (supraventricular tachycardia) (HCC)   Demand ischemia (HCC)   Chronic pain of right knee   Chronic heel pain, right  1-Sepsis; presents with tachycardia, tachypnea, febrile, leukocytosis.  CT angio notable for diffuse peribronchial thickening concerning for bronchitis or aspiration.  Treat for PNA.  Treated with with Vancomycin and Zosyn on admission. Follow WBC>  Treating for aspiration PNA. Change antibiotics to Unasyn.  Day 4  Antibiotics for PNA  SBO; Abdominal pain, constipation.  KUB ileus, unable to rule out obstruction.  Will hold robunil.  Ct with possible  small bowel mechanical obstruction. Sx consulted.  Appreciate sx evaluation. Underwent SBO protocol. Had large BM. Tolerating tube feeding.  Now with loose stool. Nurse will ask nutritionist to review formulation.   Bacteremia; staph;  2 blood culture. positive Continue with vancomycin.  ID consulted.  ECHO no vegetation.  Repeated blood culture  411 no growth.  Needs 2 weeks IV antibiotics.  Plan to get MRI ankle and Knee, await results.  Will proceed with PICC line placement.   AKI; SCr is 2.68 on admission, up from 0.57 in January 2019   Suspect related to infection, hypovolemia. Received IV bolus.  Continue with IV fluids.  Improved.   Hypokalemia;  replete orally  Hypernatremia; Stop IV fluids.  Resolved.   SVT; suspect related to acute illness, fever.  Resolved with fluids.  Mg normal.    Elevated troponin; Suspect related to demand ischemia, SVT , infection.  ECHO. Normal ef, mild pericardial effusion.    History of cva; history of seizure; continue with keppra.   Type 2 Diabetes;  SSI.   Seizure disorder; on keppra  History of DVT, PE; on lovenox.   Oral candidiasis; Change diflucan to oral.     DVT prophylaxis: lovenox Code Status: full code Family Communication; no family at bedside.  Disposition Plan:  Back to SNF if MRI knee ankle negative and after PICC line placement.   Consultants:   none   Procedures:  Echo pending.    Antimicrobials:   Vancomycin and zosyn 4-08   Subjective: She is doing ok, per nurse patient having loose stool/   Objective: Vitals:   05/11/17 0754 05/11/17 0811 05/11/17 1157 05/11/17 1206  BP:  124/89    Pulse:  88    Resp:  19    Temp: 97.6 F (36.4 C) 97.8 F (36.6 C) 98.2 F (36.8 C) 98.1 F (36.7 C)  TempSrc: Oral Oral Oral Oral  SpO2:  100%    Weight:      Height:        Intake/Output Summary (Last 24 hours) at 05/11/2017 1315 Last data filed at 05/11/2017 0423 Gross per 24 hour  Intake 2858.91 ml  Output -  Net 2858.91 ml   Filed Weights   05/09/17 0644 05/10/17 0548 05/11/17 0500  Weight: 62.6 kg (138 lb 0.1 oz) 59.9 kg (132 lb) 50.8 kg (112 lb)    Examination:  General exam: NAD Respiratory system: CTA Cardiovascular system: S 1, S 2 RRR Gastrointestinal system: Soft, nt, nd Central nervous system; Alert, aphasia.  Extremities: no edema.  Skin; No rash .    Data Reviewed: I have personally reviewed following labs and imaging studies  CBC: Recent Labs  Lab 05/06/17 2023 05/08/17 0242 05/09/17 0555 05/10/17 0633  WBC 16.7* 16.6* 10.0 8.5  NEUTROABS 13.7*  --   --   --   HGB 14.5 11.1* 11.8* 11.0*  HCT 47.9* 36.8 37.7 35.1*    MCV 96.8 96.6 92.9 92.4  PLT 306 198 210 214   Basic Metabolic Panel: Recent Labs  Lab 05/07/17 0041 05/08/17 0242 05/09/17 0555 05/10/17 0633 05/11/17 0635  NA 158* 152* 142 143 139  K 4.7 2.4* 3.0* 3.2* 4.2  CL 128* 117* 110 109 109  CO2 20* 24 20* 23 19*  GLUCOSE 109* 132* 121* 120* 108*  BUN 60* 20 7 6 8   CREATININE 2.10* 1.07* 0.77 0.87 0.76  CALCIUM 7.4* 8.7* 9.0 9.2 8.8*  MG  --  2.0  --   --   --    GFR: Estimated Creatinine Clearance: 57.7 mL/min (by C-G formula based on SCr of 0.76 mg/dL). Liver Function Tests: Recent Labs  Lab 05/07/17 0041  AST 33  ALT 15  ALKPHOS 52  BILITOT 0.6  PROT 5.3*  ALBUMIN 2.3*   No results for input(s): LIPASE, AMYLASE  in the last 168 hours. No results for input(s): AMMONIA in the last 168 hours. Coagulation Profile: Recent Labs  Lab 05/07/17 0041  INR 1.64   Cardiac Enzymes: Recent Labs  Lab 05/07/17 0041 05/07/17 0605 05/07/17 1206  TROPONINI 0.20* 0.14* 0.06*   BNP (last 3 results) No results for input(s): PROBNP in the last 8760 hours. HbA1C: No results for input(s): HGBA1C in the last 72 hours. CBG: Recent Labs  Lab 05/10/17 2011 05/11/17 0011 05/11/17 0420 05/11/17 0753 05/11/17 1156  GLUCAP 128* 119* 121* 117* 117*   Lipid Profile: No results for input(s): CHOL, HDL, LDLCALC, TRIG, CHOLHDL, LDLDIRECT in the last 72 hours. Thyroid Function Tests: No results for input(s): TSH, T4TOTAL, FREET4, T3FREE, THYROIDAB in the last 72 hours. Anemia Panel: No results for input(s): VITAMINB12, FOLATE, FERRITIN, TIBC, IRON, RETICCTPCT in the last 72 hours. Sepsis Labs: Recent Labs  Lab 05/06/17 2102 05/06/17 2332 05/07/17 0041  PROCALCITON  --   --  6.18  LATICACIDVEN 3.27* 0.87 1.3    Recent Results (from the past 240 hour(s))  Blood culture (routine x 2)     Status: Abnormal   Collection Time: 05/06/17  8:45 PM  Result Value Ref Range Status   Specimen Description BLOOD LEFT ARM  Final   Special  Requests   Final    BOTTLES DRAWN AEROBIC AND ANAEROBIC Blood Culture adequate volume   Culture  Setup Time   Final    GRAM POSITIVE COCCI ANAEROBIC BOTTLE ONLY Organism ID to follow CRITICAL RESULT CALLED TO, READ BACK BY AND VERIFIED WITH: H BAIRD PHARMD 2103 05/07/17 A BROWNING    Culture (A)  Final    STAPHYLOCOCCUS SPECIES (COAGULASE NEGATIVE) SUSCEPTIBILITIES PERFORMED ON PREVIOUS CULTURE WITHIN THE LAST 5 DAYS. Performed at [facility], [address] [place], [place] [address]    Report Status 05/09/2017 FINAL  Final  Blood Culture ID Panel (Reflexed)     Status: Abnormal   Collection Time: 05/06/17  8:45 PM  Result Value Ref Range Status   Enterococcus species NOT DETECTED NOT DETECTED Final   Listeria monocytogenes NOT DETECTED NOT DETECTED Final   Staphylococcus species DETECTED (A) NOT DETECTED Final    Comment: Methicillin (oxacillin) resistant coagulase negative staphylococcus. Possible blood culture contaminant (unless isolated from more than one blood culture draw or clinical case suggests pathogenicity). No antibiotic treatment is indicated for blood  culture contaminants. CRITICAL RESULT CALLED TO, READ BACK BY AND VERIFIED WITH: H BAIRD PHARMD 2103 05/07/17 A BROWNING    Staphylococcus aureus NOT DETECTED NOT DETECTED Final   Methicillin resistance DETECTED (A) NOT DETECTED Final    Comment: CRITICAL RESULT CALLED TO, READ BACK BY AND VERIFIED WITH: H BAIRD PHARMD 2103 05/07/17 A BROWNING    Streptococcus species NOT DETECTED NOT DETECTED Final   Streptococcus agalactiae NOT DETECTED NOT DETECTED Final   Streptococcus pneumoniae NOT DETECTED NOT DETECTED Final   Streptococcus pyogenes NOT DETECTED NOT DETECTED Final   Acinetobacter baumannii NOT DETECTED NOT DETECTED Final   Enterobacteriaceae species NOT DETECTED NOT DETECTED Final   Enterobacter cloacae complex NOT DETECTED NOT DETECTED Final   Escherichia coli NOT DETECTED NOT DETECTED Final   Klebsiella  oxytoca NOT DETECTED NOT DETECTED Final   Klebsiella pneumoniae NOT DETECTED NOT DETECTED Final   Proteus species NOT DETECTED NOT DETECTED Final   Serratia marcescens NOT DETECTED NOT DETECTED Final   Haemophilus influenzae NOT DETECTED NOT DETECTED Final   Neisseria meningitidis NOT DETECTED NOT DETECTED Final  Pseudomonas aeruginosa NOT DETECTED NOT DETECTED Final   Candida albicans NOT DETECTED NOT DETECTED Final   Candida glabrata NOT DETECTED NOT DETECTED Final   Candida krusei NOT DETECTED NOT DETECTED Final   Candida parapsilosis NOT DETECTED NOT DETECTED Final   Candida tropicalis NOT DETECTED NOT DETECTED Final    Comment: Performed at [facility], [address], [place], [place] [address]  Blood culture (routine x 2)     Status: Abnormal   Collection Time: 05/06/17  8:58 PM  Result Value Ref Range Status   Specimen Description BLOOD RIGHT HAND  Final   Special Requests   Final    BOTTLES DRAWN AEROBIC ONLY Blood Culture results may not be optimal due to an inadequate volume of blood received in culture bottles   Culture  Setup Time   Final    GRAM POSITIVE COCCI AEROBIC BOTTLE ONLY CRITICAL RESULT CALLED TO, READ BACK BY AND VERIFIED WITHCindra Presume [facility] 2103 05/07/17 A BROWNING Performed at [facility], [address], [place], [place] [address]    Culture STAPHYLOCOCCUS SPECIES (COAGULASE NEGATIVE) (A)  Final   Report Status 05/09/2017 FINAL  Final   Organism ID, Bacteria STAPHYLOCOCCUS SPECIES (COAGULASE NEGATIVE)  Final      Susceptibility   Staphylococcus species (coagulase negative) - MIC*    CIPROFLOXACIN <=0.5 SENSITIVE Sensitive     ERYTHROMYCIN >=8 RESISTANT Resistant     GENTAMICIN <=0.5 SENSITIVE Sensitive     OXACILLIN >=4 RESISTANT Resistant     TETRACYCLINE >=16 RESISTANT Resistant     VANCOMYCIN <=0.5 SENSITIVE Sensitive     TRIMETH/SULFA 80 RESISTANT Resistant     CLINDAMYCIN 0.5 SENSITIVE Sensitive     RIFAMPIN <=0.5 SENSITIVE Sensitive      Inducible Clindamycin NEGATIVE Sensitive     * STAPHYLOCOCCUS SPECIES (COAGULASE NEGATIVE)  Urine culture     Status: None   Collection Time: 05/06/17  9:21 PM  Result Value Ref Range Status   Specimen Description URINE, CATHETERIZED  Final   Special Requests NONE  Final   Culture   Final    NO GROWTH Performed at [facility], [address], [place], [place] [address]    Report Status 05/08/2017 FINAL  Final  MRSA PCR Screening     Status: None   Collection Time: 05/08/17  2:26 AM  Result Value Ref Range Status   MRSA by PCR NEGATIVE NEGATIVE Final    Comment:        The GeneXpert MRSA Assay (FDA approved for NASAL specimens only), is one component of a comprehensive MRSA colonization surveillance program. It is not intended to diagnose MRSA infection nor to guide or monitor treatment for MRSA infections. Performed at [facility], [address], [place], [place] [address]   Culture, blood (Routine X 2) w Reflex to ID Panel     Status: None (Preliminary result)   Collection Time: 05/09/17  2:45 PM  Result Value Ref Range Status   Specimen Description BLOOD RIGHT ANTECUBITAL  Final   Special Requests   Final    BOTTLES DRAWN AEROBIC AND ANAEROBIC Blood Culture adequate volume   Culture   Final    NO GROWTH 2 DAYS Performed at [facility], [address] [place], [place] [address]    Report Status PENDING  Incomplete  Culture, blood (Routine X 2) w Reflex to ID Panel     Status: None (Preliminary result)   Collection Time:  05/09/17  3:00 PM  Result Value Ref Range Status   Specimen Description BLOOD RIGHT HAND  Final   Special Requests   Final    BOTTLES DRAWN AEROBIC AND ANAEROBIC Blood Culture adequate volume   Culture   Final    NO GROWTH 2 DAYS Performed at [facility], [address], [place], [place] [address]    Report Status PENDING  Incomplete         Radiology Studies: Dg Foot Complete Right  Result Date:  05/09/2017 CLINICAL DATA:  Bacteremia. EXAM: RIGHT FOOT COMPLETE - 3+ VIEW COMPARISON:  None. FINDINGS: The bones are osteoporotic. Mild hammertoe deformity of the digits. No evidence of bony destructive change or periosteal reaction. No fracture. No radiopaque foreign body or evidence of soft tissue air. Overlying dressing artifact about the heel. IMPRESSION: 1. No radiographic findings of osteomyelitis. 2. Osteoporosis. Electronically Signed   By: Rubye Oaks M.D.   On: 05/09/2017 21:54        Scheduled Meds: . acidophilus  1 capsule Oral BID  . bethanechol  10 mg Per Tube TID  . chlorhexidine  15 mL Mouth Rinse BID  . enoxaparin  60 mg Subcutaneous Q12H  . famotidine  20 mg Per Tube BID  . feeding supplement (PRO-STAT SUGAR FREE 64)  30 mL Per Tube Daily  . fluconazole  100 mg Oral Daily  . free water  200 mL Per Tube Q8H  . insulin aspart  0-9 Units Subcutaneous Q4H  . levETIRAcetam  500 mg Per Tube BID  . mouth rinse  15 mL Mouth Rinse q12n4p  . multivitamin  15 mL Per Tube Daily  . rosuvastatin  20 mg Per Tube q1800  . sodium chloride flush  3 mL Intravenous Q12H   Continuous Infusions: . ampicillin-sulbactam (UNASYN) IV Stopped (05/11/17 1048)  . feeding supplement (OSMOLITE 1.2 CAL) 1,000 mL (05/11/17 0138)  . vancomycin Stopped (05/11/17 0238)     LOS: 5 days    Time spent: 35 minutes.     Alba Cory, MD [facility] Pager [phone]  If 7PM-7AM, please contact night-coverage [email or web address] Password [email or web address] 05/11/2017, 1:15 PM

## 2017-05-11 NOTE — Plan of Care (Signed)
Progressing

## 2017-05-11 NOTE — Progress Notes (Signed)
CRITICAL VALUE ALERT  Critical Value: Vancomycin trough 23  Date & Time Notied:  05/11/17  Provider Notified: pharmacy Mae  Orders Received/Actions taken:will adjust next dose

## 2017-05-11 NOTE — Progress Notes (Signed)
Unable to contact son Okey DupreCarlton Marinez via telephone for PICC consent. Attempted x 2.  Shinita RN notified of attempts.  Shinita also to try to get consent if speaks with son.

## 2017-05-12 ENCOUNTER — Inpatient Hospital Stay (HOSPITAL_COMMUNITY): Payer: Medicaid Other

## 2017-05-12 ENCOUNTER — Encounter (HOSPITAL_COMMUNITY): Payer: Self-pay | Admitting: Radiology

## 2017-05-12 LAB — GLUCOSE, CAPILLARY
GLUCOSE-CAPILLARY: 121 mg/dL — AB (ref 65–99)
Glucose-Capillary: 103 mg/dL — ABNORMAL HIGH (ref 65–99)
Glucose-Capillary: 120 mg/dL — ABNORMAL HIGH (ref 65–99)
Glucose-Capillary: 123 mg/dL — ABNORMAL HIGH (ref 65–99)
Glucose-Capillary: 123 mg/dL — ABNORMAL HIGH (ref 65–99)

## 2017-05-12 LAB — BASIC METABOLIC PANEL
Anion gap: 11 (ref 5–15)
BUN: 8 mg/dL (ref 6–20)
CHLORIDE: 106 mmol/L (ref 101–111)
CO2: 24 mmol/L (ref 22–32)
CREATININE: 0.7 mg/dL (ref 0.44–1.00)
Calcium: 9.3 mg/dL (ref 8.9–10.3)
GFR calc Af Amer: >60 mL/min (ref >60)
GFR calc non Af Amer: >60 mL/min (ref >60)
Glucose, Bld: 113 mg/dL — ABNORMAL HIGH (ref 65–99)
POTASSIUM: 3.6 mmol/L (ref 3.5–5.1)
Sodium: 141 mmol/L (ref 135–145)

## 2017-05-12 LAB — CBC
HEMATOCRIT: 36 % (ref 36.0–46.0)
Hemoglobin: 11.6 g/dL — ABNORMAL LOW (ref 12.0–15.0)
MCH: 29.4 pg (ref 26.0–34.0)
MCHC: 32.2 g/dL (ref 30.0–36.0)
MCV: 91.4 fL (ref 78.0–100.0)
PLATELETS: 231 10*3/uL (ref 150–400)
RBC: 3.94 MIL/uL (ref 3.87–5.11)
RDW: 14.3 % (ref 11.5–15.5)
WBC: 8.9 10*3/uL (ref 4.0–10.5)

## 2017-05-12 MED ORDER — MORPHINE SULFATE (PF) 2 MG/ML IV SOLN
1.0000 mg | Freq: Once | INTRAVENOUS | Status: AC | PRN
  Administered 2017-05-13: 1 mg via INTRAVENOUS
  Filled 2017-05-12: qty 1

## 2017-05-12 NOTE — Progress Notes (Signed)
Patient went for MRI of ankle and knee. Radiology was unable to obtain images d/t patient unable to tolerate positions; pain. Spoke with MD about above and orders received to give 1mg  Morphine prior to MRI. Patient willing to try again.

## 2017-05-12 NOTE — Progress Notes (Signed)
Attempted to contact Rachel Vang (son) to obtain consent for PICC placement. Have left several messages with no return call.

## 2017-05-12 NOTE — Progress Notes (Signed)
OT Cancellation Note  Patient Details Name: Rachel MinksStephanie N Veale MRN: [id number] DOB: 07/24/1965   Cancelled Treatment:    Reason Eval/Treat Not Completed: OT screened, no needs identified, will sign off. Per chart review; pt bed bound, nonverbal, and dependent on PEG tube for feeding. Pt from SNF with plans to return to SNF upon d/c. No acute OT needs identified; will screen and sign off at this time. Please re-consult if needs change.   Gaye AlkenBailey A Urho Rio M.S., OTR/L Pager: [phone]  05/12/2017, 8:42 AM

## 2017-05-12 NOTE — Evaluation (Signed)
Physical Therapy Evaluation and Discharge Patient Details Name: Rachel Vang MRN: [id number] DOB: [birth year] Today's Date: 05/12/2017   History of Present Illness  52 y.o. 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  Patient is reportedly bedbound, nonverbal, and dependent on PEG tube at her baseline (per chart). +sepsis  Clinical Impression  Patient evaluated by Physical Therapy with no further PT needs identified. Patient with bil LE contractures suggestive that pt does not weight-bear to get OOB. Per chart she is bedbound. Appears her CVA was prior to 08/2015 (unclear per chart) and pt is likely a long-term resident of SNF and not appropriate for PT.  PT is signing off. Thank you for this referral.     Follow Up Recommendations No PT follow up    Equipment Recommendations  None recommended by PT    Recommendations for Other Services       Precautions / Restrictions Precautions Precautions: Fall      Mobility  Bed Mobility Overal bed mobility: Needs Assistance Bed Mobility: Rolling Rolling: Mod assist;Max assist         General bed mobility comments: to pt's right with moderate assist (positioning legs, assisted reach for rail, pt helped by pulling on rail and turning her head); similar to her left but required more assist  Transfers                 General transfer comment: pt's contractures would suggest she does not stand/bear weight on her legs to get OOB; she nods when asked if staff use a lift to get her OOB   Ambulation/Gait                Stairs            Wheelchair Mobility    Modified Rankin (Stroke Patients Only)       Balance                                              Pertinent Vitals/Pain Pain Assessment: Faces Faces Pain Scale: Hurts even more Pain Location: ?left knee or hip (grimace with ROM LLE) Pain Descriptors / Indicators: Grimacing Pain Intervention(s): Monitored during session;Repositioned    Home Living Family/patient expects to be discharged to:: Skilled nursing facility                      Prior Function Level of Independence: Needs assistance   Gait / Transfers Assistance Needed: per chart bedbound           Hand Dominance        Extremity/Trunk Assessment   Upper Extremity Assessment Upper Extremity Assessment: Generalized weakness(left weaker than rt; no contractures noted)    Lower Extremity Assessment Lower Extremity Assessment: RLE deficits/detail;LLE deficits/detail RLE Deficits / Details: ankle PF contracture ~20 degrees; hip and knee limited in extension; strength ankle 0/5, hip/knee 2+/5 LLE Deficits / Details: ankle PF contracture ~30 degrees; hip and knee limited in extension; strength ankle 0/5, hip/knee 2+/5       Communication   Communication: Expressive difficulties(nods yes to questions)  Cognition Arousal/Alertness: Awake/alert Behavior During Therapy: Flat affect Overall Cognitive Status: Difficult to assess  General Comments: followed 1 step commands ~75% of time      General Comments General comments (skin integrity, edema, etc.): Unclear when pt had CVA (first documented 08/2015); appears pt is a long-term resident of SNF and is not appropriate for PT     Exercises     Assessment/Plan    PT Assessment Patent does not need any further PT services  PT Problem List         PT Treatment Interventions      PT Goals (Current goals can be found in the Care Plan section)  Acute Rehab PT Goals PT Goal Formulation: All assessment and education complete, DC therapy    Frequency     Barriers to discharge        Co-evaluation                AM-PAC PT "6 Clicks" Daily Activity  Outcome Measure Difficulty turning over in bed (including adjusting bedclothes, sheets and blankets)?: A Lot Difficulty moving from lying on back to sitting on the side of the bed? : Unable Difficulty sitting down on and standing up from a chair with arms (e.g., wheelchair, bedside commode, etc,.)?: Unable Help needed moving to and from a bed to chair (including a wheelchair)?: Total Help needed walking in hospital room?: Total Help needed climbing 3-5 steps with a railing? : Total 6 Click Score: 7    End of Session   Activity Tolerance: Patient tolerated treatment well Patient left: in bed;with nursing/sitter in room Nurse Communication: Mobility status;Need for lift equipment PT Visit Diagnosis: Muscle weakness (generalized) (M62.81)    Time: [id number] PT Time Calculation (min) (ACUTE ONLY): 16 min   Charges:   PT Evaluation $PT Eval Low Complexity: 1 Low     PT G Codes:          [employer] Sciences CorporationLynn P Leialoha Hanna, PT 05/12/2017, 2:01 PM

## 2017-05-12 NOTE — Progress Notes (Signed)
Spoke with Elease HashimotoAlisha RN re PICC line consent.  No family yet available for consent.  IF RN able to obtain consent today, will place IVT consult to notify,

## 2017-05-12 NOTE — Progress Notes (Signed)
Called son for PICC consent, still no answer.

## 2017-05-12 NOTE — Progress Notes (Signed)
PROGRESS NOTE    Rachel Vang  ZOX:[id number] DOB: [birth year] DOA: 05/06/2017 PCP: Kirt Boys, DO    Brief Narrative:  Rachel Vang is a 52 y.o. 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  Patient is reportedly bedbound, nonverbal, and dependent on PEG tube at her baseline.  She was noted by nursing home personnel to be tachypneic and tachycardic today and EMS was called.  Patient was suspected to be in SVT and was treated with adenosine 6 mg, and then 12 mg prior to arrival in the ED.  There was no significant change in heart rate with adenosine and she was also given 500 cc of normal saline.  Patient is unable to contribute to the history due to her clinical condition.  ED Course: Upon arrival to the ED, patient is found to be febrile to 39.9 C, tachycardic to 170, tachypneic, and slightly hypotensive.  EKG features supraventricular tachycardia with rate 171 and chest x-ray is negative for acute cardiopulmonary disease.  Chemistry panel is notable for a sodium of 154, BUN 68, and creatinine 2.68, up from 0.57 in January.  CBC features a leukocytosis to 16,700 and lactic acid is elevated to 3.27.  Troponin is elevated to 0.21.  Urinalysis is unremarkable.  CTA chest is negative for PE, but notable for diffuse peribronchial thickening with debris in the bronchi, concerning for possible acute bronchitis or aspiration.  Blood and urine cultures were collected in the ED, 4.75 L of normal saline was given, and the patient was started on Rocephin and azithromycin.  PCCM was consulted by the ED physician and recommended a medical admission to the stepdown unit.  Heart rate has improved with the IV fluids, MAP remains greater than 65, and the patient will be admitted  to the stepdown unit for ongoing evaluation and management of severe sepsis suspected secondary to a pulmonary source.    Assessment & Plan:   Principal Problem:   Severe sepsis (HCC) Active Problems:   Sepsis (HCC)   Seizures (HCC)   Elevated troponin   Protein-calorie malnutrition, severe (HCC)   Type II diabetes mellitus with neurological manifestations (HCC)   Essential hypertension, benign   Hemiparesis affecting right side as late effect of stroke (HCC)   Chronic pulmonary embolism (HCC)   AKI (acute kidney injury) (HCC)   Hypernatremia   Abdominal pain   Coagulase negative Staphylococcus bacteremia   Small bowel obstruction (HCC)   SVT (supraventricular tachycardia) (HCC)   Demand ischemia (HCC)   Chronic pain of right knee   Chronic heel pain, right  1-Sepsis; presents with tachycardia, tachypnea, febrile, leukocytosis.  CT angio notable for diffuse peribronchial thickening concerning for bronchitis or aspiration.  Treat for PNA.  Treated with with Vancomycin and Zosyn on admission. Follow WBC>  Treating for aspiration PNA. Change antibiotics to Unasyn.  Day 5-5  Antibiotics for PNA  SBO; Abdominal pain, constipation.  KUB ileus, unable to rule out obstruction.  Will hold robunil.  Ct with possible  small bowel mechanical obstruction. Sx consulted.  Appreciate sx evaluation. Underwent SBO protocol. Had large BM. Tolerating tube feeding.  No significant diarrhea.   Bacteremia; staph;  2 blood culture. positive Continue with vancomycin.  ID consulted.  ECHO no vegetation.  Repeated blood culture 411 no growth.  Needs 2 weeks IV  antibiotics.  Had MRI knee limited study due to pain. Negative for infection. Will try MRI ankle this afternoon with pain meds.  Will proceed with PICC line placement. Awaiting family to get consent.   AKI; SCr is 2.68 on admission, up from 0.57 in January 2019   Suspect related to infection, hypovolemia. Received IV bolus.    Continue with IV fluids.  Improved.   Hypokalemia; replete orally  Hypernatremia; Stop IV fluids.  Resolved.   SVT; suspect related to acute illness, fever.  Resolved with fluids.  Mg normal.    Elevated troponin; Suspect related to demand ischemia, SVT , infection.  ECHO. Normal ef, mild pericardial effusion.    History of cva; history of seizure; continue with keppra.   Type 2 Diabetes;  SSI.   Seizure disorder; on keppra  History of DVT, PE; on lovenox.   Oral candidiasis; Change diflucan to oral.     DVT prophylaxis: lovenox Code Status: full code Family Communication; no family at bedside.  Disposition Plan:  Back to SNF if MRI knee ankle negative and after PICC line placement.   Consultants:   none   Procedures:  Echo pending.    Antimicrobials:   Vancomycin and zosyn 4-08   Subjective: Smiling, denies pain.   Objective: Vitals:   05/11/17 1624 05/11/17 2044 05/12/17 0500 05/12/17 1341  BP: 129/87 129/86 (!) 128/100 110/90  Pulse: 98 (!) 103 93 (!) 107  Resp: 18 18 18 20   Temp: 99.2 F (37.3 C) 99.3 F (37.4 C) 98.3 F (36.8 C) 98.6 F (37 C)  TempSrc: Oral Oral Axillary Oral  SpO2: 100% 100% 100% 100%  Weight:   50.3 kg (111 lb)   Height:        Intake/Output Summary (Last 24 hours) at 05/12/2017 1600 Last data filed at 05/12/2017 0900 Gross per 24 hour  Intake 1953.92 ml  Output 600 ml  Net 1353.92 ml   Filed Weights   05/10/17 0548 05/11/17 0500 05/12/17 0500  Weight: 59.9 kg (132 lb) 50.8 kg (112 lb) 50.3 kg (111 lb)    Examination:  General exam: NAD Respiratory system: CTA Cardiovascular system: S 1, S 2 RRR Gastrointestinal system: Soft, nt, peg tube in place.  Central nervous system; Alert , aphasia  Extremities: no edema.  Skin; No rash .    Data Reviewed: I have personally reviewed following labs and imaging studies  CBC: Recent Labs  Lab 05/06/17 2023 05/08/17 0242 05/09/17 0555 05/10/17 0633  05/12/17 0818  WBC 16.7* 16.6* 10.0 8.5 8.9  NEUTROABS 13.7*  --   --   --   --   HGB 14.5 11.1* 11.8* 11.0* 11.6*  HCT 47.9* 36.8 37.7 35.1* 36.0  MCV 96.8 96.6 92.9 92.4 91.4  PLT 306 198 210 214 231   Basic Metabolic Panel: Recent Labs  Lab 05/08/17 0242 05/09/17 0555 05/10/17 0633 05/11/17 0635 05/12/17 0818  NA 152* 142 143 139 141  K 2.4* 3.0* 3.2* 4.2 3.6  CL 117* 110 109 109 106  CO2 24 20* 23 19* 24  GLUCOSE 132* 121* 120* 108* 113*  BUN 20 7 6 8 8   CREATININE 1.07* 0.77 0.87 0.76 0.70  CALCIUM 8.7* 9.0 9.2 8.8* 9.3  MG 2.0  --   --   --   --    GFR: Estimated Creatinine Clearance: 57.7 mL/min (by C-G formula based on SCr of 0.7 mg/dL). Liver Function Tests: Recent Labs  Lab 05/07/17 0041  AST 33  ALT  15  ALKPHOS 52  BILITOT 0.6  PROT 5.3*  ALBUMIN 2.3*   No results for input(s): LIPASE, AMYLASE in the last 168 hours. No results for input(s): AMMONIA in the last 168 hours. Coagulation Profile: Recent Labs  Lab 05/07/17 0041  INR 1.64   Cardiac Enzymes: Recent Labs  Lab 05/07/17 0041 05/07/17 0605 05/07/17 1206  TROPONINI 0.20* 0.14* 0.06*   BNP (last 3 results) No results for input(s): PROBNP in the last 8760 hours. HbA1C: No results for input(s): HGBA1C in the last 72 hours. CBG: Recent Labs  Lab 05/11/17 2041 05/12/17 0008 05/12/17 0513 05/12/17 0807 05/12/17 1158  GLUCAP 111* 103* 123* 123* 120*   Lipid Profile: No results for input(s): CHOL, HDL, LDLCALC, TRIG, CHOLHDL, LDLDIRECT in the last 72 hours. Thyroid Function Tests: No results for input(s): TSH, T4TOTAL, FREET4, T3FREE, THYROIDAB in the last 72 hours. Anemia Panel: No results for input(s): VITAMINB12, FOLATE, FERRITIN, TIBC, IRON, RETICCTPCT in the last 72 hours. Sepsis Labs: Recent Labs  Lab 05/06/17 2102 05/06/17 2332 05/07/17 0041  PROCALCITON  --   --  6.18  LATICACIDVEN 3.27* 0.87 1.3    Recent Results (from the past 240 hour(s))  Blood culture (routine  x 2)     Status: Abnormal   Collection Time: 05/06/17  8:45 PM  Result Value Ref Range Status   Specimen Description BLOOD LEFT ARM  Final   Special Requests   Final    BOTTLES DRAWN AEROBIC AND ANAEROBIC Blood Culture adequate volume   Culture  Setup Time   Final    GRAM POSITIVE COCCI ANAEROBIC BOTTLE ONLY Organism ID to follow CRITICAL RESULT CALLED TO, READ BACK BY AND VERIFIED WITH: H BAIRD PHARMD 2103 05/07/17 A BROWNING    Culture (A)  Final    STAPHYLOCOCCUS SPECIES (COAGULASE NEGATIVE) SUSCEPTIBILITIES PERFORMED ON PREVIOUS CULTURE WITHIN THE LAST 5 DAYS. Performed at [facility], [address] [place], [place] [address]    Report Status 05/09/2017 FINAL  Final  Blood Culture ID Panel (Reflexed)     Status: Abnormal   Collection Time: 05/06/17  8:45 PM  Result Value Ref Range Status   Enterococcus species NOT DETECTED NOT DETECTED Final   Listeria monocytogenes NOT DETECTED NOT DETECTED Final   Staphylococcus species DETECTED (A) NOT DETECTED Final    Comment: Methicillin (oxacillin) resistant coagulase negative staphylococcus. Possible blood culture contaminant (unless isolated from more than one blood culture draw or clinical case suggests pathogenicity). No antibiotic treatment is indicated for blood  culture contaminants. CRITICAL RESULT CALLED TO, READ BACK BY AND VERIFIED WITH: H BAIRD PHARMD 2103 05/07/17 A BROWNING    Staphylococcus aureus NOT DETECTED NOT DETECTED Final   Methicillin resistance DETECTED (A) NOT DETECTED Final    Comment: CRITICAL RESULT CALLED TO, READ BACK BY AND VERIFIED WITH: H BAIRD PHARMD 2103 05/07/17 A BROWNING    Streptococcus species NOT DETECTED NOT DETECTED Final   Streptococcus agalactiae NOT DETECTED NOT DETECTED Final   Streptococcus pneumoniae NOT DETECTED NOT DETECTED Final   Streptococcus pyogenes NOT DETECTED NOT DETECTED Final   Acinetobacter baumannii NOT DETECTED NOT DETECTED Final   Enterobacteriaceae species NOT  DETECTED NOT DETECTED Final   Enterobacter cloacae complex NOT DETECTED NOT DETECTED Final   Escherichia coli NOT DETECTED NOT DETECTED Final   Klebsiella oxytoca NOT DETECTED NOT DETECTED Final   Klebsiella pneumoniae NOT DETECTED NOT DETECTED Final   Proteus species NOT DETECTED NOT DETECTED Final   Serratia marcescens NOT DETECTED  NOT DETECTED Final   Haemophilus influenzae NOT DETECTED NOT DETECTED Final   Neisseria meningitidis NOT DETECTED NOT DETECTED Final   Pseudomonas aeruginosa NOT DETECTED NOT DETECTED Final   Candida albicans NOT DETECTED NOT DETECTED Final   Candida glabrata NOT DETECTED NOT DETECTED Final   Candida krusei NOT DETECTED NOT DETECTED Final   Candida parapsilosis NOT DETECTED NOT DETECTED Final   Candida tropicalis NOT DETECTED NOT DETECTED Final    Comment: Performed at [facility], [address], [place], [place] [address]  Blood culture (routine x 2)     Status: Abnormal   Collection Time: 05/06/17  8:58 PM  Result Value Ref Range Status   Specimen Description BLOOD RIGHT HAND  Final   Special Requests   Final    BOTTLES DRAWN AEROBIC ONLY Blood Culture results may not be optimal due to an inadequate volume of blood received in culture bottles   Culture  Setup Time   Final    GRAM POSITIVE COCCI AEROBIC BOTTLE ONLY CRITICAL RESULT CALLED TO, READ BACK BY AND VERIFIED WITHCindra Presume [facility] 2103 05/07/17 A BROWNING Performed at [facility], [address], [place], [place] [address]    Culture STAPHYLOCOCCUS SPECIES (COAGULASE NEGATIVE) (A)  Final   Report Status 05/09/2017 FINAL  Final   Organism ID, Bacteria STAPHYLOCOCCUS SPECIES (COAGULASE NEGATIVE)  Final      Susceptibility   Staphylococcus species (coagulase negative) - MIC*    CIPROFLOXACIN <=0.5 SENSITIVE Sensitive     ERYTHROMYCIN >=8 RESISTANT Resistant     GENTAMICIN <=0.5 SENSITIVE Sensitive     OXACILLIN >=4 RESISTANT Resistant     TETRACYCLINE >=16 RESISTANT Resistant      VANCOMYCIN <=0.5 SENSITIVE Sensitive     TRIMETH/SULFA 80 RESISTANT Resistant     CLINDAMYCIN 0.5 SENSITIVE Sensitive     RIFAMPIN <=0.5 SENSITIVE Sensitive     Inducible Clindamycin NEGATIVE Sensitive     * STAPHYLOCOCCUS SPECIES (COAGULASE NEGATIVE)  Urine culture     Status: None   Collection Time: 05/06/17  9:21 PM  Result Value Ref Range Status   Specimen Description URINE, CATHETERIZED  Final   Special Requests NONE  Final   Culture   Final    NO GROWTH Performed at [facility], [address], [place], [place] [address]    Report Status 05/08/2017 FINAL  Final  MRSA PCR Screening     Status: None   Collection Time: 05/08/17  2:26 AM  Result Value Ref Range Status   MRSA by PCR NEGATIVE NEGATIVE Final    Comment:        The GeneXpert MRSA Assay (FDA approved for NASAL specimens only), is one component of a comprehensive MRSA colonization surveillance program. It is not intended to diagnose MRSA infection nor to guide or monitor treatment for MRSA infections. Performed at [facility], [address] [place], [place] [address]   Culture, blood (Routine X 2) w Reflex to ID Panel     Status: None (Preliminary result)   Collection Time: 05/09/17  2:45 PM  Result Value Ref Range Status   Specimen Description BLOOD RIGHT ANTECUBITAL  Final   Special Requests   Final    BOTTLES DRAWN AEROBIC AND ANAEROBIC Blood Culture adequate volume   Culture   Final    NO GROWTH 3 DAYS Performed at [facility], [address], [place], [place] [address]    Report Status PENDING  Incomplete  Culture, blood (Routine X 2) w Reflex to ID Panel     Status: None (Preliminary result)   Collection Time: 05/09/17  3:00 PM  Result Value Ref Range Status   Specimen Description BLOOD RIGHT HAND  Final   Special Requests   Final    BOTTLES DRAWN AEROBIC AND ANAEROBIC Blood Culture adequate volume   Culture   Final    NO GROWTH 3 DAYS Performed at [facility],  [address], [place], [place] [address]    Report Status PENDING  Incomplete         Radiology Studies: Mr Knee Right Wo Contrast  Result Date: 05/12/2017 CLINICAL DATA:  Right knee pain and swelling. Currently admitted for sepsis. Concern for septic arthritis. EXAM: MRI OF THE RIGHT KNEE WITHOUT CONTRAST TECHNIQUE: Multiplanar, multisequence MR imaging of the knee was performed. No intravenous contrast was administered. Incomplete study as only three axial sequences were obtained. The patient refused further imaging. COMPARISON:  None. FINDINGS: MENISCI Medial meniscus:  Not well evaluated. Lateral meniscus:  Not well evaluated. LIGAMENTS Cruciates:  Grossly intact. Collaterals:  Grossly intact. CARTILAGE Patellofemoral: Mild partial-thickness cartilage loss over the patella. Medial:  Not well evaluated. Lateral:  Not well evaluated. Joint:  No joint effusion. Popliteal Fossa:  No Baker cyst. Intact popliteus tendon. Extensor Mechanism:  Grossly intact. Bones: Partially visualized serpiginous marrow signal within the proximal tibial diaphysis likely reflects a bone infarct. No suspicious marrow signal abnormality. Other: None. IMPRESSION: 1. Incomplete study as only three axial sequences were obtained. The patient refused further imaging due to pain. 2. No joint effusion, excluding septic arthritis. 3. 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 No evidence of osteomyelitis. Electronically Signed   By: Obie Dredge M.D.   On: 05/12/2017 10:10   [place] Ekg Site Rite  Result Date: 05/11/2017 If Site Rite image not attached, placement could not be confirmed due to current cardiac rhythm.       Scheduled Meds: . acidophilus  1 capsule Oral BID  . bethanechol  10 mg Per Tube TID  . chlorhexidine  15 mL Mouth Rinse BID  . enoxaparin  60 mg Subcutaneous Q12H  . famotidine  20 mg Per Tube BID  . feeding supplement (PRO-STAT SUGAR FREE 64)  30 mL Per Tube Daily  . fluconazole  100 mg Oral Daily  .  free water  200 mL Per Tube Q8H  . insulin aspart  0-9 Units Subcutaneous Q4H  . levETIRAcetam  500 mg Per Tube BID  . mouth rinse  15 mL Mouth Rinse q12n4p  . multivitamin  15 mL Per Tube Daily  . rosuvastatin  20 mg Per Tube q1800  . sodium chloride flush  3 mL Intravenous Q12H   Continuous Infusions: . ampicillin-sulbactam (UNASYN) IV Stopped (05/12/17 1208)  . feeding supplement (OSMOLITE 1.2 CAL) 1,000 mL (05/11/17 2018)  . vancomycin       LOS: 6 days    Time spent: 35 minutes.     Alba Cory, MD [facility] Pager [phone]  If 7PM-7AM, please contact night-coverage [email or web address] Password [email or web address] 05/12/2017, 4:00 PM

## 2017-05-13 ENCOUNTER — Inpatient Hospital Stay (HOSPITAL_COMMUNITY): Payer: Medicaid Other

## 2017-05-13 DIAGNOSIS — M25561 Pain in right knee: Secondary | ICD-10-CM

## 2017-05-13 DIAGNOSIS — J690 Pneumonitis due to inhalation of food and vomit: Secondary | ICD-10-CM

## 2017-05-13 DIAGNOSIS — I69820 Aphasia following other cerebrovascular disease: Secondary | ICD-10-CM

## 2017-05-13 DIAGNOSIS — I69851 Hemiplegia and hemiparesis following other cerebrovascular disease affecting right dominant side: Secondary | ICD-10-CM

## 2017-05-13 LAB — GLUCOSE, CAPILLARY
GLUCOSE-CAPILLARY: 123 mg/dL — AB (ref 65–99)
GLUCOSE-CAPILLARY: 118 mg/dL — AB (ref 65–99)
GLUCOSE-CAPILLARY: 139 mg/dL — AB (ref 65–99)
GLUCOSE-CAPILLARY: 99 mg/dL (ref 65–99)
GLUCOSE-CAPILLARY: 90 mg/dL (ref 65–99)
Glucose-Capillary: 117 mg/dL — ABNORMAL HIGH (ref 65–99)
Glucose-Capillary: 133 mg/dL — ABNORMAL HIGH (ref 65–99)

## 2017-05-13 MED ORDER — VANCOMYCIN HCL 500 MG IV SOLR: 500.0000 mg | Freq: Two times a day (BID) | INTRAVENOUS | 0 refills | Status: AC

## 2017-05-13 MED ORDER — ENOXAPARIN SODIUM 60 MG/0.6ML ~~LOC~~ SOLN: 50.0000 mg | Freq: Two times a day (BID) | SUBCUTANEOUS | Status: DC

## 2017-05-13 MED ORDER — FLUCONAZOLE 100 MG PO TABS: 100.0000 mg | ORAL_TABLET | Freq: Every day | ORAL | 0 refills | Status: DC

## 2017-05-13 MED ORDER — SODIUM CHLORIDE 0.9% FLUSH: 10.0000 mL | INTRAVENOUS | Status: DC | PRN

## 2017-05-13 NOTE — Social Work (Signed)
Clinical Social Worker facilitated patient discharge including contacting patient family and facility to confirm patient discharge plans.  Clinical information faxed to facility and family agreeable with plan.  CSW [facility].   RN to call [phone] with report prior to discharge.  Clinical Social Worker will sign off for now as social work intervention is no longer needed. Please consult us again if new need arises.  Doy HutchingIsabel H Sarabella Caprio, [place] Clinical Social Worker [phone]

## 2017-05-13 NOTE — Social Work (Addendum)
CSW aware that pt has been able to get picc placed, CSW has reached out to SNF where pt is LTC resident regarding discharge today.   Facility and MD aware of ability to discharge today.   CSW continuing to follow.  Doy HutchingIsabel H Jayce Boyko, LCSWA [facility] Clinical Social Work [phone]

## 2017-05-13 NOTE — Progress Notes (Signed)
Peripherally Inserted Central Catheter/Midline Placement  The IV Nurse has discussed with the patient and/or persons authorized to consent for the patient, the purpose of this procedure and the potential benefits and risks involved with this procedure.  The benefits include less needle sticks, lab draws from the catheter, and the patient may be discharged home with the catheter. Risks include, but not limited to, infection, bleeding, blood clot (thrombus formation), and puncture of an artery; nerve damage and irregular heartbeat and possibility to perform a PICC exchange if needed/ordered by physician.  Alternatives to this procedure were also discussed.  Bard Power PICC patient education guide, fact sheet on infection prevention and patient information card has been provided to patient /or left at bedside.    PICC/Midline Placement Documentation  PICC Single Lumen 05/13/17 PICC Left Brachial 45 cm 1 cm (Active)  Indication for Insertion or Continuance of Line Home intravenous therapies (PICC only) 05/13/2017  2:00 PM  Exposed Catheter (cm) 1 cm 05/13/2017  2:00 PM  Site Assessment Clean;Dry;Intact 05/13/2017  2:00 PM  Line Status Flushed;Blood return noted;Saline locked 05/13/2017  2:00 PM  Dressing Type Transparent 05/13/2017  2:00 PM  Dressing Status Clean;Dry;Intact;Antimicrobial disc in place 05/13/2017  2:00 PM  Dressing Change Due 05/20/17 05/13/2017  2:00 PM       Edwin Caphomasson III, Janique Hoefer Andrew 05/13/2017, 2:23 PM

## 2017-05-13 NOTE — Progress Notes (Signed)
Subjective: No new complaints   Antibiotics:  Anti-infectives (From admission, onward)   Start     Dose/Rate Route Frequency Ordered Stop   05/13/17 0000  vancomycin 500 mg in sodium chloride 0.9 % 100 mL     500 mg 100 mL/hr over 60 Minutes Intravenous Every 12 hours 05/13/17 1343 05/22/17 2359   05/13/17 0000  fluconazole (DIFLUCAN) 100 MG tablet     100 mg Oral Daily 05/13/17 1343     05/12/17 2000  vancomycin (VANCOCIN) 500 mg in sodium chloride 0.9 % 100 mL IVPB     500 mg 100 mL/hr over 60 Minutes Intravenous Every 12 hours 05/11/17 1430     05/11/17 1000  fluconazole (DIFLUCAN) tablet 100 mg     100 mg Oral Daily 05/11/17 0826     05/10/17 1645  Ampicillin-Sulbactam (UNASYN) 3 g in sodium chloride 0.9 % 100 mL IVPB     3 g 200 mL/hr over 30 Minutes Intravenous Every 6 hours 05/10/17 1046     05/09/17 1515  Ampicillin-Sulbactam (UNASYN) 3 g in sodium chloride 0.9 % 100 mL IVPB  Status:  Discontinued     3 g 200 mL/hr over 30 Minutes Intravenous Every 6 hours 05/09/17 1505 05/10/17 1046   05/09/17 1400  vancomycin (VANCOCIN) IVPB 750 mg/150 ml premix  Status:  Discontinued     750 mg 150 mL/hr over 60 Minutes Intravenous Every 12 hours 05/09/17 1131 05/11/17 1430   05/09/17 0200  vancomycin (VANCOCIN) 500 mg in sodium chloride 0.9 % 100 mL IVPB  Status:  Discontinued     500 mg 100 mL/hr over 60 Minutes Intravenous Every 12 hours 05/08/17 1454 05/09/17 1131   05/08/17 1800  piperacillin-tazobactam (ZOSYN) IVPB 3.375 g  Status:  Discontinued     3.375 g 12.5 mL/hr over 240 Minutes Intravenous Every 8 hours 05/08/17 1011 05/09/17 1504   05/08/17 1426  vancomycin (VANCOCIN) 500 mg in sodium chloride 0.9 % 100 mL IVPB  Status:  Discontinued     500 mg 100 mL/hr over 60 Minutes Intravenous Every 12 hours 05/08/17 1011 05/08/17 1441   05/08/17 0100  vancomycin (VANCOCIN) IVPB 750 mg/150 ml premix  Status:  Discontinued     750 mg 150 mL/hr over 60 Minutes Intravenous  Every 24 hours 05/07/17 0014 05/08/17 1011   05/07/17 1130  fluconazole (DIFLUCAN) IVPB 100 mg  Status:  Discontinued     100 mg 50 mL/hr over 60 Minutes Intravenous Every 24 hours 05/07/17 1006 05/11/17 0826   05/07/17 0800  piperacillin-tazobactam (ZOSYN) IVPB 2.25 g  Status:  Discontinued     2.25 g 100 mL/hr over 30 Minutes Intravenous Every 8 hours 05/07/17 0014 05/08/17 1011   05/06/17 2345  piperacillin-tazobactam (ZOSYN) IVPB 3.375 g     3.375 g 100 mL/hr over 30 Minutes Intravenous  Once 05/06/17 2342 05/07/17 0140   05/06/17 2345  vancomycin (VANCOCIN) IVPB 1000 mg/200 mL premix     1,000 mg 200 mL/hr over 60 Minutes Intravenous  Once 05/06/17 2342 05/07/17 0141   05/06/17 2115  cefTRIAXone (ROCEPHIN) 1 g in sodium chloride 0.9 % 100 mL IVPB     1 g 200 mL/hr over 30 Minutes Intravenous  Once 05/06/17 2108 05/06/17 2209   05/06/17 2115  azithromycin (ZITHROMAX) 500 mg in sodium chloride 0.9 % 250 mL IVPB     500 mg 250 mL/hr over 60 Minutes Intravenous  Once 05/06/17 2108 05/06/17 2315  Medications: Scheduled Meds: . acidophilus  1 capsule Oral BID  . bethanechol  10 mg Per Tube TID  . chlorhexidine  15 mL Mouth Rinse BID  . enoxaparin  50 mg Subcutaneous Q12H  . famotidine  20 mg Per Tube BID  . feeding supplement (PRO-STAT SUGAR FREE 64)  30 mL Per Tube Daily  . fluconazole  100 mg Oral Daily  . free water  200 mL Per Tube Q8H  . insulin aspart  0-9 Units Subcutaneous Q4H  . levETIRAcetam  500 mg Per Tube BID  . mouth rinse  15 mL Mouth Rinse q12n4p  . multivitamin  15 mL Per Tube Daily  . rosuvastatin  20 mg Per Tube q1800  . sodium chloride flush  3 mL Intravenous Q12H   Continuous Infusions: . ampicillin-sulbactam (UNASYN) IV 3 g (05/13/17 1438)  . feeding supplement (OSMOLITE 1.2 CAL) 1,000 mL (05/12/17 1938)  . vancomycin 500 mg (05/13/17 0900)   PRN Meds:.acetaminophen **OR** acetaminophen, sodium chloride flush    Objective: Weight change: 0 lb  (0 kg)  Intake/Output Summary (Last 24 hours) at 05/13/2017 1603 Last data filed at 05/13/2017 0600 Gross per 24 hour  Intake 1954 ml  Output 1200 ml  Net 754 ml   Blood pressure (!) 140/95, pulse 98, temperature 98.4 F (36.9 C), temperature source Oral, resp. rate 18, height 5' (1.524 m), weight 111 lb (50.3 kg), SpO2 100 %. Temp:  [98.2 F (36.8 C)-98.4 F (36.9 C)] 98.4 F (36.9 C) (04/15 1513) Pulse Rate:  [92-103] 98 (04/15 1513) Resp:  [18] 18 (04/15 1513) BP: (134-147)/(95-99) 140/95 (04/15 1513) SpO2:  [100 %] 100 % (04/15 1513) Weight:  [111 lb (50.3 kg)] 111 lb (50.3 kg) (04/15 0500)  Physical Exam: General: Alert and awake with aphasia HEENT: anicteric sclera,  EOMI CVS regular rate, normal r,  no murmur rubs or gallops Chest: no wheezing, and no resp distress Abdomen: softnondistended, normal bowel sounds, Extremities: tenderness around right knee Skin: breakdown at heel Neuro: RLE weakness and some aphasia CBC: CBC Latest Ref Rng & Units 05/12/2017 05/10/2017 05/09/2017  WBC 4.0 - 10.5 K/uL 8.9 8.5 10.0  Hemoglobin 12.0 - 15.0 g/dL 11.6(L) 11.0(L) 11.8(L)  Hematocrit 36.0 - 46.0 % 36.0 35.1(L) 37.7  Platelets 150 - 400 K/uL 231 214 210      BMET Recent Labs    05/11/17 0635 05/12/17 0818  NA 139 141  K 4.2 3.6  CL 109 106  CO2 19* 24  GLUCOSE 108* 113*  BUN 8 8  CREATININE 0.76 0.70  CALCIUM 8.8* 9.3     Liver Panel  No results for input(s): PROT, ALBUMIN, AST, ALT, ALKPHOS, BILITOT, BILIDIR, IBILI in the last 72 hours.     Sedimentation Rate Recent Labs    05/11/17 0635  ESRSEDRATE 65*   C-Reactive Protein Recent Labs    05/11/17 0635  CRP 3.7*    Micro Results: Recent Results (from the past 720 hour(s))  Blood culture (routine x 2)     Status: Abnormal   Collection Time: 05/06/17  8:45 PM  Result Value Ref Range Status   Specimen Description BLOOD LEFT ARM  Final   Special Requests   Final    BOTTLES DRAWN AEROBIC AND  ANAEROBIC Blood Culture adequate volume   Culture  Setup Time   Final    GRAM POSITIVE COCCI ANAEROBIC BOTTLE ONLY Organism ID to follow CRITICAL RESULT CALLED TO, READ BACK BY AND VERIFIED WITH: H BAIRD PHARMD 2103  05/07/17 A BROWNING    Culture (A)  Final    STAPHYLOCOCCUS SPECIES (COAGULASE NEGATIVE) SUSCEPTIBILITIES PERFORMED ON PREVIOUS CULTURE WITHIN THE LAST 5 DAYS. Performed at [facility], [address] [place], [place] [address]    Report Status 05/09/2017 FINAL  Final  Blood Culture ID Panel (Reflexed)     Status: Abnormal   Collection Time: 05/06/17  8:45 PM  Result Value Ref Range Status   Enterococcus species NOT DETECTED NOT DETECTED Final   Listeria monocytogenes NOT DETECTED NOT DETECTED Final   Staphylococcus species DETECTED (A) NOT DETECTED Final    Comment: Methicillin (oxacillin) resistant coagulase negative staphylococcus. Possible blood culture contaminant (unless isolated from more than one blood culture draw or clinical case suggests pathogenicity). No antibiotic treatment is indicated for blood  culture contaminants. CRITICAL RESULT CALLED TO, READ BACK BY AND VERIFIED WITH: H BAIRD PHARMD 2103 05/07/17 A BROWNING    Staphylococcus aureus NOT DETECTED NOT DETECTED Final   Methicillin resistance DETECTED (A) NOT DETECTED Final    Comment: CRITICAL RESULT CALLED TO, READ BACK BY AND VERIFIED WITH: H BAIRD PHARMD 2103 05/07/17 A BROWNING    Streptococcus species NOT DETECTED NOT DETECTED Final   Streptococcus agalactiae NOT DETECTED NOT DETECTED Final   Streptococcus pneumoniae NOT DETECTED NOT DETECTED Final   Streptococcus pyogenes NOT DETECTED NOT DETECTED Final   Acinetobacter baumannii NOT DETECTED NOT DETECTED Final   Enterobacteriaceae species NOT DETECTED NOT DETECTED Final   Enterobacter cloacae complex NOT DETECTED NOT DETECTED Final   Escherichia coli NOT DETECTED NOT DETECTED Final   Klebsiella oxytoca NOT DETECTED NOT DETECTED Final    Klebsiella pneumoniae NOT DETECTED NOT DETECTED Final   Proteus species NOT DETECTED NOT DETECTED Final   Serratia marcescens NOT DETECTED NOT DETECTED Final   Haemophilus influenzae NOT DETECTED NOT DETECTED Final   Neisseria meningitidis NOT DETECTED NOT DETECTED Final   Pseudomonas aeruginosa NOT DETECTED NOT DETECTED Final   Candida albicans NOT DETECTED NOT DETECTED Final   Candida glabrata NOT DETECTED NOT DETECTED Final   Candida krusei NOT DETECTED NOT DETECTED Final   Candida parapsilosis NOT DETECTED NOT DETECTED Final   Candida tropicalis NOT DETECTED NOT DETECTED Final    Comment: Performed at [facility], [address], [place], [place] [address]  Blood culture (routine x 2)     Status: Abnormal   Collection Time: 05/06/17  8:58 PM  Result Value Ref Range Status   Specimen Description BLOOD RIGHT HAND  Final   Special Requests   Final    BOTTLES DRAWN AEROBIC ONLY Blood Culture results may not be optimal due to an inadequate volume of blood received in culture bottles   Culture  Setup Time   Final    GRAM POSITIVE COCCI AEROBIC BOTTLE ONLY CRITICAL RESULT CALLED TO, READ BACK BY AND VERIFIED WITHCindra [facility] 2103 05/07/17 A BROWNING Performed at [facility], [address] [place], [place] [address]    Culture STAPHYLOCOCCUS SPECIES (COAGULASE NEGATIVE) (A)  Final   Report Status 05/09/2017 FINAL  Final   Organism ID, Bacteria STAPHYLOCOCCUS SPECIES (COAGULASE NEGATIVE)  Final      Susceptibility   Staphylococcus species (coagulase negative) - MIC*    CIPROFLOXACIN <=0.5 SENSITIVE Sensitive     ERYTHROMYCIN >=8 RESISTANT Resistant     GENTAMICIN <=0.5 SENSITIVE Sensitive     OXACILLIN >=4 RESISTANT Resistant     TETRACYCLINE >=16 RESISTANT Resistant     VANCOMYCIN <=  0.5 SENSITIVE Sensitive     TRIMETH/SULFA 80 RESISTANT Resistant     CLINDAMYCIN 0.5 SENSITIVE Sensitive     RIFAMPIN <=0.5 SENSITIVE Sensitive     Inducible Clindamycin NEGATIVE  Sensitive     * STAPHYLOCOCCUS SPECIES (COAGULASE NEGATIVE)  Urine culture     Status: None   Collection Time: 05/06/17  9:21 PM  Result Value Ref Range Status   Specimen Description URINE, CATHETERIZED  Final   Special Requests NONE  Final   Culture   Final    NO GROWTH Performed at [facility], [address] [place], [place] [address]    Report Status 05/08/2017 FINAL  Final  MRSA PCR Screening     Status: None   Collection Time: 05/08/17  2:26 AM  Result Value Ref Range Status   MRSA by PCR NEGATIVE NEGATIVE Final    Comment:        The GeneXpert MRSA Assay (FDA approved for NASAL specimens only), is one component of a comprehensive MRSA colonization surveillance program. It is not intended to diagnose MRSA infection nor to guide or monitor treatment for MRSA infections. Performed at [facility], [address] [place], [place] [address]   Culture, blood (Routine X 2) w Reflex to ID Panel     Status: None (Preliminary result)   Collection Time: 05/09/17  2:45 PM  Result Value Ref Range Status   Specimen Description BLOOD RIGHT ANTECUBITAL  Final   Special Requests   Final    BOTTLES DRAWN AEROBIC AND ANAEROBIC Blood Culture adequate volume   Culture   Final    NO GROWTH 4 DAYS Performed at [facility], [address], [place], [place] [address]    Report Status PENDING  Incomplete  Culture, blood (Routine X 2) w Reflex to ID Panel     Status: None (Preliminary result)   Collection Time: 05/09/17  3:00 PM  Result Value Ref Range Status   Specimen Description BLOOD RIGHT HAND  Final   Special Requests   Final    BOTTLES DRAWN AEROBIC AND ANAEROBIC Blood Culture adequate volume   Culture   Final    NO GROWTH 4 DAYS Performed at [facility], [address], [place], [place] [address]    Report Status PENDING  Incomplete    Studies/Results: Mr Ankle Right Wo Contrast  Result Date: 05/13/2017 CLINICAL DATA:  Septic arthritis  suspected.  Pain. EXAM: MRI OF THE RIGHT ANKLE WITHOUT CONTRAST TECHNIQUE: Multiplanar, multisequence MR imaging of the ankle was performed. No intravenous contrast was administered. COMPARISON:  None. FINDINGS: TENDONS Peroneal: Peroneal longus tendon intact. Peroneal brevis intact. Posteromedial: Posterior tibial tendon intact. Flexor hallucis longus tendon intact. Flexor digitorum longus tendon intact. Anterior: Tibialis anterior tendon intact. Extensor hallucis longus tendon intact Extensor digitorum longus tendon intact. Achilles:  Intact. Plantar Fascia: Intact. LIGAMENTS Lateral: Anterior talofibular ligament intact. Calcaneofibular ligament intact. Posterior talofibular ligament intact. Anterior and posterior tibiofibular ligaments intact. Medial: Deltoid ligament intact. Spring ligament intact. CARTILAGE Ankle Joint: No joint effusion. Normal ankle mortise. No chondral defect. Subtalar Joints/Sinus Tarsi: Normal subtalar joints. No subtalar joint effusion. Normal sinus tarsi. Bones: Serpiginous T2 hyperintense signal abnormality in the distal tibial metaphysis with central T1 hyperintensity most consistent with a chronic bone infarct. No acute fracture or dislocation. No periosteal reaction or bone destruction. Soft Tissue: Soft tissue ulcer overlying posterior calcaneus. IMPRESSION: 1. No osteomyelitis of right ankle. 2. No MRI evidence of septic arthritis  of the right ankle. 3. Soft tissue ulcer overlying the posterior calcaneus. Electronically Signed   By: Elige Ko   On: 05/13/2017 11:18   Mr Knee Right Wo Contrast  Result Date: 05/12/2017 CLINICAL DATA:  Right knee pain and swelling. Currently admitted for sepsis. Concern for septic arthritis. EXAM: MRI OF THE RIGHT KNEE WITHOUT CONTRAST TECHNIQUE: Multiplanar, multisequence MR imaging of the knee was performed. No intravenous contrast was administered. Incomplete study as only three axial sequences were obtained. The patient refused further  imaging. COMPARISON:  None. FINDINGS: MENISCI Medial meniscus:  Not well evaluated. Lateral meniscus:  Not well evaluated. LIGAMENTS Cruciates:  Grossly intact. Collaterals:  Grossly intact. CARTILAGE Patellofemoral: Mild partial-thickness cartilage loss over the patella. Medial:  Not well evaluated. Lateral:  Not well evaluated. Joint:  No joint effusion. Popliteal Fossa:  No Baker cyst. Intact popliteus tendon. Extensor Mechanism:  Grossly intact. Bones: Partially visualized serpiginous marrow signal within the proximal tibial diaphysis likely reflects a bone infarct. No suspicious marrow signal abnormality. Other: None. IMPRESSION: 1. Incomplete study as only three axial sequences were obtained. The patient refused further imaging due to pain. 2. No joint effusion, excluding septic arthritis. 3. 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 No evidence of osteomyelitis. Electronically Signed   By: Obie Dredge M.D.   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 female with history of hemorrhagic stroke with right-sided hemiparesis dependent on PEG tube for nutrition he was admitted with sepsis  in part due to coagulase-negative staphylococcal bacteremia and possible aspiration pneumonia  1.  Coagulase-negative staphylococcal bacteremia: Source is unclear but could have been due to a break in the skin or prior peripheral IV.  She has no evidence of septic knee or septic joint she does not have evidence of endocarditis by 2D echocardiogram  I would have her complete 2 weeks of IV vancomycin  We can then see her in approximately 4 weeks and check surveillance cultures  2.  Aspiration pneumonia: Should complete therapy very soon  Diagnosis: Coag negative staphylococcal bacteremia  Culture Result: Methicillin resistant coagulation-negative Staphylococcus  No Known Allergies  OPAT Orders Discharge antibiotics: Vancomycin Per pharmacy protocol Vancomycin Aim for Vancomycin trough 15-20 (unless otherwise indicated) Duration: 2 weeks End Date:  05/22/17  [facility] Per Protocol: BIWEEKLY LABS WHILE ON IV ABX:  _x_ BMP w GFR  weekly while on IV antibiotics: _x_ CBC with differential  _x_ Vancomycin trough  _x_ Please pull PIC at completion of IV antibiotics __ Please leave PIC in place until doctor has seen patient or been notified  Fax weekly labs to [phone]  Clinic  Follow Up Appt:  Next 4 weeks  I will sign off please call with further questions.    LOS: 7 days   Acey LavCornelius Van Dam 05/13/2017, 4:03 PM

## 2017-05-13 NOTE — Discharge Summary (Addendum)
Physician Discharge Summary  Rachel Vang ZOX:[id number] DOB: 12-22-1965 DOA: 05/06/2017  PCP: Kirt Boys, DO  Admit date: 05/06/2017 Discharge date: 05/13/2017  Admitted From: Home  Disposition:  Home   Recommendations for Outpatient Follow-up:  1. Follow up with PCP in 1-2 weeks 2. Please obtain BMP/CBC in one week 3. Needs to follow with Dr Zenaida Niece dam for further care of coag negative staph Bacteremia.     Discharge Condition: Stable.  CODE STATUS: full Code.  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 Patient is reportedly bedbound, nonverbal, and dependent on PEG tube at her baseline. She was noted by nursing home personnel to be tachypneic and tachycardic today and EMS was called. Patient was suspected to be in SVT and was treated with adenosine 6 mg, and then 12 mg prior to arrival in the ED. There was no significant change in heart rate with adenosine and she was also given 500 cc of normal saline. Patient is unable to contribute to the history due to her clinical condition.  ED Course:Upon arrival to the ED, patient is found to be febrile to 39.9C,tachycardic to 170, tachypneic, and slightly hypotensive. EKG features supraventricular tachycardia with rate 171 and chest x-ray is negative for acute cardiopulmonary disease. Chemistry panel is notable for a sodium of 154, BUN 68, and creatinine 2.68, up from 0.57 in January. CBC features a leukocytosis to 16,700 and lactic acid is elevated to 3.27. Troponin is elevated to 0.21. Urinalysis is unremarkable. CTA chest is negative for PE, but notable for diffuse peribronchial thickening with debris in  the bronchi, concerning for possible acute bronchitis or aspiration. Blood and urine cultures were collected in the ED, 4.75 L of normal saline was given, and the patient was started on Rocephin and azithromycin. PCCM was consulted by the ED physician and recommended a medical admission to the stepdown unit. Heart rate has improved with the IV fluids, MAPremains greater than 65, and the patient will be admitted to the stepdown unit for ongoing evaluation and management of severe sepsis suspected secondary to a pulmonary source.    Assessment & Plan:   Principal Problem:   Severe sepsis (HCC) Active Problems:   Sepsis (HCC)   Seizures (HCC)   Elevated troponin   Protein-calorie malnutrition, severe (HCC)   Type II diabetes mellitus with neurological manifestations (HCC)   Essential hypertension, benign   Hemiparesis affecting right side as late effect of stroke (HCC)   Chronic pulmonary embolism (HCC)   AKI (acute kidney injury) (HCC)   Hypernatremia   Abdominal pain   Coagulase negative Staphylococcus bacteremia   Small bowel obstruction (HCC)   SVT (supraventricular tachycardia) (HCC)   Demand ischemia (HCC)   Chronic pain of right knee   Chronic heel pain, right  1-Sepsis; presents with tachycardia, tachypnea, febrile, leukocytosis.  CT angio notable for diffuse peribronchial thickening concerning for bronchitis or aspiration.  Treat for PNA.  Treated with with Vancomycin and Zosyn on admission. Follow WBC>  Treating for aspiration PNA. Change antibiotics to Unasyn.  Day 5-5  Antibiotics for PNA finished antibiotics for pneumonia.   SBO; Abdominal pain, constipation.  KUB ileus, unable to rule out obstruction.  Will hold robunil.  Ct with possible  small bowel mechanical obstruction. Sx  consulted.  Appreciate sx evaluation. Underwent SBO protocol. Had large BM. Tolerating tube feeding.  No significant diarrhea.   Bacteremia; staph; coagulase negative 2 blood  culture. positive Continue with vancomycin.  ID consulted.  ECHO no vegetation.  Repeated blood culture 411 no growth.  Needs 2 weeks IV antibiotics.  Had MRI knee and ankle negative./  Discharge on 2 weeks of vancomycin.  Needs to follow up with ID.   AKI; SCr is 2.68 on admission, up from 0.57 in January 2019 Suspect related to infection, hypovolemia. Received IV bolus.  Continue with IV fluids.  Improved.   Hypokalemia; replete orally  Hypernatremia; Stop IV fluids.  Resolved.   SVT; suspect related to acute illness, fever.  Resolved with fluids.  Mg normal.    Elevated troponin; Suspect related to demand ischemia, SVT , infection.  ECHO. Normal ef, mild pericardial effusion.    History of cva; history of seizure; continue with keppra.   Type 2 Diabetes;  SSI.   Seizure disorder; on keppra  History of DVT, PE; on lovenox.   Oral candidiasis; Change diflucan to oral. Two more days   Posterior heel ulcer; present prior to admission, pressure ulcer. Local care.   Discharge Diagnoses:  Principal Problem:   Severe sepsis (HCC) Active Problems:   Sepsis (HCC)   Seizures (HCC)   Elevated troponin   Protein-calorie malnutrition, severe (HCC)   Type II diabetes mellitus with neurological manifestations (HCC)   Essential hypertension, benign   Hemiparesis affecting right side as late effect of stroke (HCC)   Chronic pulmonary embolism (HCC)   AKI (acute kidney injury) (HCC)   Hypernatremia   Abdominal pain   Coagulase negative Staphylococcus bacteremia   Small bowel obstruction (HCC)   SVT (supraventricular tachycardia) (HCC)   Demand ischemia (HCC)   Chronic pain of right knee   Chronic heel pain, right    Discharge Instructions  Discharge Instructions    Increase activity slowly   Complete by:  As directed      Allergies as of 05/13/2017   No Known Allergies     Medication List    STOP taking these medications   glycopyrrolate  1 MG tablet Commonly known as:  ROBINUL   loperamide 2 MG tablet Commonly known as:  IMODIUM A-D   NEOMYCIN-POLYMYXIN-BACITRACIN EX   ondansetron 4 MG tablet Commonly known as:  ZOFRAN   oxyCODONE 5 MG immediate release tablet Commonly known as:  ROXICODONE   tetracycline 250 MG capsule Commonly known as:  ACHROMYCIN,SUMYCIN   WATER FOR INJECTION STERILE IJ     TAKE these medications   acetaminophen 325 MG tablet Commonly known as:  TYLENOL Place 650 mg into feeding tube every 4 (four) hours as needed for mild pain or fever.   bethanechol 10 MG tablet Commonly known as:  URECHOLINE Place 10 mg into feeding tube 3 (three) times daily.   DECUBI-VITE Caps Place 1 capsule into feeding tube daily.   multivitamin with minerals tablet Place 1 tablet into feeding tube daily.   enoxaparin 60 MG/0.6ML injection Commonly known as:  LOVENOX Inject 0.6 mLs (60 mg total) into the skin 2 (two) times daily.   feeding supplement (JEVITY 1.5 CAL/FIBER) Liqd Place 1,000 mLs into feeding tube continuous.   feeding supplement (PRO-STAT SUGAR FREE 64) Liqd Place 60 mLs into feeding tube 3 (three) times daily with meals.   fluconazole 100 MG tablet Commonly known as:  DIFLUCAN Take 1 tablet (100 mg total) by mouth daily.  free water Soln Place 200 mLs into feeding tube 4 (four) times daily.   gabapentin 100 MG capsule Commonly known as:  NEURONTIN Take 200 mg by mouth 2 (two) times daily. HOLD IF LETHARGIC   levETIRAcetam 100 MG/ML solution Commonly known as:  KEPPRA Place 5 mLs (500 mg total) into feeding tube 2 (two) times daily.   polyethylene glycol powder powder Commonly known as:  GLYCOLAX/MIRALAX Place 17 g into feeding tube daily.   PROBIOTIC DAILY Caps Place 1 capsule into feeding tube 2 (two) times daily. FOR 82 DAYS FOR GINGIVITIS   ranitidine 150 MG tablet Commonly known as:  ZANTAC Take 150 mg by mouth daily.   rosuvastatin 20 MG tablet Commonly known  as:  CRESTOR Place 20 mg into feeding tube at bedtime.   traZODone 50 MG tablet Commonly known as:  DESYREL Take 1 tablet (50 mg total) by mouth at bedtime. What changed:  how to take this   vancomycin 500 mg in sodium chloride 0.9 % 100 mL Inject 500 mg into the vein every 12 (twelve) hours for 9 days.       No Known Allergies  Consultations:  ID   Procedures/Studies: Ct Abdomen Pelvis Wo Contrast  Result Date: 05/07/2017 CLINICAL DATA:  Abdominal distension EXAM: CT ABDOMEN AND PELVIS WITHOUT CONTRAST TECHNIQUE: Multidetector CT imaging of the abdomen and pelvis was performed following the standard protocol without IV contrast. COMPARISON:  CT abdomen pelvis 02/22/2017, CT chest 05/06/2017 FINDINGS: Lower chest: Lung bases demonstrate trace right pleural effusion with development of patchy consolidation at the right lung base. Trace pericardial effusion. Hepatobiliary: No focal hepatic abnormality. Layering sludge or small stones in the gallbladder. No biliary dilatation Pancreas: Unremarkable. No pancreatic ductal dilatation or surrounding inflammatory changes. Spleen: Normal in size without focal abnormality. Adrenals/Urinary Tract: Adrenal glands are within normal limits. No hydronephrosis. Patchy hyperdensity within the right renal cortex with small amount of excreted contrast in the collecting systems. Residual contrast within the urinary bladder. Stomach/Bowel: Gastrostomy tube in the body of the stomach. Stomach is decompressed. Dilated segment of small bowel in the right abdomen measuring up to 4.6 cm. Abrupt transition two decompressed caliber small bowel in the central pelvis, consistent with bowel obstruction. Moderate retained feces in the rectum. Mild colon diverticular disease. Small amount of radiopaque material within the distal colon. Vascular/Lymphatic: Moderate aortic atherosclerosis. No aneurysmal dilatation. No significantly enlarged lymph nodes. Reproductive: Status  post hysterectomy. No adnexal masses. Other: Negative for free air or significant free fluid. Small hyperdense foci and gas within the subcutaneous fat of the abdominal wall anteriorly, possibly due to subcutaneous injections. Musculoskeletal: No acute or suspicious abnormality. IMPRESSION: 1. Focally dilated segment of small bowel within the right abdomen with abrupt transition to decompressed distal small bowel in the central upper pelvis with decompressed distal small bowel and colon; overall pattern favors a mechanical small bowel obstruction. Given the focal appearance and right-sided distribution of the dilated small bowel, could consider closed loop obstruction although no definite mesenteric swirling or mesenteric vascular crowding is identified. Negative for intramural air or free air. 2. Development of trace right pleural effusion with consolidation at the right base which may reflect atelectasis or pneumonia 3. Patchy increased cortical density in the right kidney, suspect for delayed nephrogram as may be seen with compromised renal function. 4. Layering stones or sludge within the gallbladder. Electronically Signed   By: Jasmine Pang M.D.   On: 05/07/2017 15:24   Dg Abd 1 View  Result  Date: 05/07/2017 CLINICAL DATA:  In communicative patient, severe abdominal pain and tightness, vomiting, urinary retention EXAM: ABDOMEN - 1 VIEW COMPARISON:  CT abdomen pelvis of 02/22/2017 FINDINGS: Compared to the prior CT of the abdomen pelvis, there are dilated loops of small bowel present and very little colonic bowel gas is seen. Although this could indicate ileus, partial small bowel obstruction is a definite consideration. Gastrostomy tube again is noted. Free air cannot be evaluated on the supine images. No opaque calculi are seen. Some contrast is noted within the urinary bladder. The bones are osteopenic. IMPRESSION: 1. Dilated loops of small bowel with very little colonic bowel gas. Possible ileus but  cannot exclude partial small bowel obstruction. CT the abdomen pelvis may be helpful if warranted. 2. Gastrostomy tube is present. 3. Osteopenia. Electronically Signed   By: Dwyane DeePaul  Barry M.D.   On: 05/07/2017 11:22   Ct Angio Chest Pe W And/or Wo Contrast  Result Date: 05/06/2017 CLINICAL DATA:  52 y/o  F; altered mental status and tachycardia. EXAM: CT ANGIOGRAPHY CHEST WITH CONTRAST TECHNIQUE: Multidetector CT imaging of the chest was performed using the standard protocol during bolus administration of intravenous contrast. Multiplanar CT image reconstructions and MIPs were obtained to evaluate the vascular anatomy. CONTRAST:  100mL ISOVUE-370 IOPAMIDOL (ISOVUE-370) INJECTION 76% COMPARISON:  02/17/2015 CT angiogram chest. FINDINGS: Cardiovascular: Normal heart size. No pericardial effusion. Normal caliber thoracic aorta with mild calcific atherosclerosis. Moderate coronary artery calcification. Normal caliber main pulmonary artery. Satisfactory opacification of the pulmonary arteries. Mediastinum/Nodes: No enlarged mediastinal, hilar, or axillary lymph nodes. Thyroid gland, trachea, and esophagus demonstrate no acute findings. Mildly patulous esophagus. Lungs/Pleura: Diffuse peribronchial thickening with debris in the mainstem bronchi extending predominantly into the lower lobes bilaterally. No consolidation, effusion, or pneumothorax. Upper Abdomen: No acute abnormality. Musculoskeletal: No chest wall abnormality. No acute or significant osseous findings. Review of the MIP images confirms the above findings. IMPRESSION: 1. No pulmonary embolus identified. 2. Diffuse peribronchial thickening with debris in the mainstem bronchi and extending into the lower lobes. Findings may represent acute bronchitis or aspiration given the distribution. No consolidation. 3. Mildly patulous esophagus. 4. Aorta and coronary artery calcific atherosclerosis. Electronically Signed   By: Mitzi HansenLance  Furusawa-Stratton M.D.   On:  05/06/2017 22:46   Mr Ankle Right Wo Contrast  Result Date: 05/13/2017 CLINICAL DATA:  Septic arthritis suspected.  Pain. EXAM: MRI OF THE RIGHT ANKLE WITHOUT CONTRAST TECHNIQUE: Multiplanar, multisequence MR imaging of the ankle was performed. No intravenous contrast was administered. COMPARISON:  None. FINDINGS: TENDONS Peroneal: Peroneal longus tendon intact. Peroneal brevis intact. Posteromedial: Posterior tibial tendon intact. Flexor hallucis longus tendon intact. Flexor digitorum longus tendon intact. Anterior: Tibialis anterior tendon intact. Extensor hallucis longus tendon intact Extensor digitorum longus tendon intact. Achilles:  Intact. Plantar Fascia: Intact. LIGAMENTS Lateral: Anterior talofibular ligament intact. Calcaneofibular ligament intact. Posterior talofibular ligament intact. Anterior and posterior tibiofibular ligaments intact. Medial: Deltoid ligament intact. Spring ligament intact. CARTILAGE Ankle Joint: No joint effusion. Normal ankle mortise. No chondral defect. Subtalar Joints/Sinus Tarsi: Normal subtalar joints. No subtalar joint effusion. Normal sinus tarsi. Bones: Serpiginous T2 hyperintense signal abnormality in the distal tibial metaphysis with central T1 hyperintensity most consistent with a chronic bone infarct. No acute fracture or dislocation. No periosteal reaction or bone destruction. Soft Tissue: Soft tissue ulcer overlying posterior calcaneus. IMPRESSION: 1. No osteomyelitis of right ankle. 2. No MRI evidence of septic arthritis of the right ankle. 3. Soft tissue ulcer overlying the posterior calcaneus. Electronically Signed  By: Elige Ko   On: 05/13/2017 11:18   Mr Knee Right Wo Contrast  Result Date: 05/12/2017 CLINICAL DATA:  Right knee pain and swelling. Currently admitted for sepsis. Concern for septic arthritis. EXAM: MRI OF THE RIGHT KNEE WITHOUT CONTRAST TECHNIQUE: Multiplanar, multisequence MR imaging of the knee was performed. No intravenous contrast was  administered. Incomplete study as only three axial sequences were obtained. The patient refused further imaging. COMPARISON:  None. FINDINGS: MENISCI Medial meniscus:  Not well evaluated. Lateral meniscus:  Not well evaluated. LIGAMENTS Cruciates:  Grossly intact. Collaterals:  Grossly intact. CARTILAGE Patellofemoral: Mild partial-thickness cartilage loss over the patella. Medial:  Not well evaluated. Lateral:  Not well evaluated. Joint:  No joint effusion. Popliteal Fossa:  No Baker cyst. Intact popliteus tendon. Extensor Mechanism:  Grossly intact. Bones: Partially visualized serpiginous marrow signal within the proximal tibial diaphysis likely reflects a bone infarct. No suspicious marrow signal abnormality. Other: None. IMPRESSION: 1. Incomplete study as only three axial sequences were obtained. The patient refused further imaging due to pain. 2. No joint effusion, excluding septic arthritis. 3. 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 No evidence of osteomyelitis. Electronically Signed   By: Obie Dredge M.D.   On: 05/12/2017 10:10   Dg Chest Port 1 View  Result Date: 05/06/2017 CLINICAL DATA:  Shortness of breath. EXAM: PORTABLE CHEST 1 VIEW COMPARISON:  Radiograph February 20, 2017. FINDINGS: The heart size and mediastinal contours are within normal limits. Both lungs are clear. No pneumothorax or pleural effusion is noted. The visualized skeletal structures are unremarkable. IMPRESSION: No acute cardiopulmonary abnormality seen. Electronically Signed   By: Lupita Raider, M.D.   On: 05/06/2017 20:49   Dg Abd Portable 1v-small Bowel Obstruction Protocol-initial, 8 Hr Delay  Result Date: 05/08/2017 CLINICAL DATA:  Small bowel obstruction, 8 hour delayed film. EXAM: PORTABLE ABDOMEN - 1 VIEW COMPARISON:  CT yesterday FINDINGS: Administered enteric contrast visualized throughout the ascending, transverse, and to lesser extent descending colon. Decreased gaseous distention of small bowel from prior  exam, with persistent dilated small bowel loops centrally in the upper abdomen. No evidence of free air. Elevated right hemidiaphragm. Gastrostomy tube projects over the upper abdomen. IMPRESSION: Administered enteric contrast within the ascending, transverse, and to a lesser extent descending colon. Improving gaseous small bowel distention with persistent dilated small bowel loop in the upper abdomen. Electronically Signed   By: Rubye Oaks M.D.   On: 05/08/2017 03:05   Dg Foot Complete Right  Result Date: 05/09/2017 CLINICAL DATA:  Bacteremia. EXAM: RIGHT FOOT COMPLETE - 3+ VIEW COMPARISON:  None. FINDINGS: The bones are osteoporotic. Mild hammertoe deformity of the digits. No evidence of bony destructive change or periosteal reaction. No fracture. No radiopaque foreign body or evidence of soft tissue air. Overlying dressing artifact about the heel. IMPRESSION: 1. No radiographic findings of osteomyelitis. 2. Osteoporosis. Electronically Signed   By: Rubye Oaks M.D.   On: 05/09/2017 21:54   [place] Ekg Site Rite  Result Date: 05/11/2017 If Site Rite image not attached, placement could not be confirmed due to current cardiac rhythm.   Subjective: Smile, non verbal./   Discharge Exam: Vitals:   05/12/17 2216 05/13/17 0500  BP: (!) 147/99 (!) 134/96  Pulse: 92 (!) 103  Resp: 18 18  Temp: 98.2 F (36.8 C) 98.4 F (36.9 C)  SpO2: 100% 100%   Vitals:   05/12/17 0500 05/12/17 1341 05/12/17 2216 05/13/17 0500  BP: (!) 128/100 110/90 (!) 147/99 (!) 134/96  Pulse: 93 (!) 107 92 (!) 103  Resp: 18 20 18 18   Temp: 98.3 F (36.8 C) 98.6 F (37 C) 98.2 F (36.8 C) 98.4 F (36.9 C)  TempSrc: Axillary Oral Oral Axillary  SpO2: 100% 100% 100% 100%  Weight: 50.3 kg (111 lb)   50.3 kg (111 lb)  Height:        General: Pt is alert, awake, not in acute distress Cardiovascular: RRR, S1/S2 +, no rubs, no gallops Respiratory: CTA bilaterally, no wheezing, no rhonchi Abdominal: Soft, NT,  ND, bowel sounds + peg tube in place.  Extremities: no edema, no cyanosis    The results of significant diagnostics from this hospitalization (including imaging, microbiology, ancillary and laboratory) are listed below for reference.     Microbiology: Recent Results (from the past 240 hour(s))  Blood culture (routine x 2)     Status: Abnormal   Collection Time: 05/06/17  8:45 PM  Result Value Ref Range Status   Specimen Description BLOOD LEFT ARM  Final   Special Requests   Final    BOTTLES DRAWN AEROBIC AND ANAEROBIC Blood Culture adequate volume   Culture  Setup Time   Final    GRAM POSITIVE COCCI ANAEROBIC BOTTLE ONLY Organism ID to follow CRITICAL RESULT CALLED TO, READ BACK BY AND VERIFIED WITH: H BAIRD PHARMD 2103 05/07/17 A BROWNING    Culture (A)  Final    STAPHYLOCOCCUS SPECIES (COAGULASE NEGATIVE) SUSCEPTIBILITIES PERFORMED ON PREVIOUS CULTURE WITHIN THE LAST 5 DAYS. Performed at [facility], [address] [place], [place] [address]    Report Status 05/09/2017 FINAL  Final  Blood Culture ID Panel (Reflexed)     Status: Abnormal   Collection Time: 05/06/17  8:45 PM  Result Value Ref Range Status   Enterococcus species NOT DETECTED NOT DETECTED Final   Listeria monocytogenes NOT DETECTED NOT DETECTED Final   Staphylococcus species DETECTED (A) NOT DETECTED Final    Comment: Methicillin (oxacillin) resistant coagulase negative staphylococcus. Possible blood culture contaminant (unless isolated from more than one blood culture draw or clinical case suggests pathogenicity). No antibiotic treatment is indicated for blood  culture contaminants. CRITICAL RESULT CALLED TO, READ BACK BY AND VERIFIED WITH: H BAIRD PHARMD 2103 05/07/17 A BROWNING    Staphylococcus aureus NOT DETECTED NOT DETECTED Final   Methicillin resistance DETECTED (A) NOT DETECTED Final    Comment: CRITICAL RESULT CALLED TO, READ BACK BY AND VERIFIED WITH: H BAIRD PHARMD 2103 05/07/17 A BROWNING     Streptococcus species NOT DETECTED NOT DETECTED Final   Streptococcus agalactiae NOT DETECTED NOT DETECTED Final   Streptococcus pneumoniae NOT DETECTED NOT DETECTED Final   Streptococcus pyogenes NOT DETECTED NOT DETECTED Final   Acinetobacter baumannii NOT DETECTED NOT DETECTED Final   Enterobacteriaceae species NOT DETECTED NOT DETECTED Final   Enterobacter cloacae complex NOT DETECTED NOT DETECTED Final   Escherichia coli NOT DETECTED NOT DETECTED Final   Klebsiella oxytoca NOT DETECTED NOT DETECTED Final   Klebsiella pneumoniae NOT DETECTED NOT DETECTED Final   Proteus species NOT DETECTED NOT DETECTED Final   Serratia marcescens NOT DETECTED NOT DETECTED Final   Haemophilus influenzae NOT DETECTED NOT DETECTED Final   Neisseria meningitidis NOT DETECTED NOT DETECTED Final   Pseudomonas aeruginosa NOT DETECTED NOT DETECTED Final   Candida albicans NOT DETECTED NOT DETECTED Final   Candida glabrata NOT DETECTED NOT DETECTED Final   Candida krusei NOT DETECTED NOT DETECTED Final   Candida parapsilosis NOT DETECTED NOT DETECTED  Final   Candida tropicalis NOT DETECTED NOT DETECTED Final    Comment: Performed at [facility], [address] [place], [place] [address]  Blood culture (routine x 2)     Status: Abnormal   Collection Time: 05/06/17  8:58 PM  Result Value Ref Range Status   Specimen Description BLOOD RIGHT HAND  Final   Special Requests   Final    BOTTLES DRAWN AEROBIC ONLY Blood Culture results may not be optimal due to an inadequate volume of blood received in culture bottles   Culture  Setup Time   Final    GRAM POSITIVE COCCI AEROBIC BOTTLE ONLY CRITICAL RESULT CALLED TO, READ BACK BY AND VERIFIED WITHCindra Presume [facility] 2103 05/07/17 A BROWNING Performed at [facility], [address], [place], [place] [address]    Culture STAPHYLOCOCCUS SPECIES (COAGULASE NEGATIVE) (A)  Final   Report Status 05/09/2017 FINAL  Final   Organism ID, Bacteria STAPHYLOCOCCUS  SPECIES (COAGULASE NEGATIVE)  Final      Susceptibility   Staphylococcus species (coagulase negative) - MIC*    CIPROFLOXACIN <=0.5 SENSITIVE Sensitive     ERYTHROMYCIN >=8 RESISTANT Resistant     GENTAMICIN <=0.5 SENSITIVE Sensitive     OXACILLIN >=4 RESISTANT Resistant     TETRACYCLINE >=16 RESISTANT Resistant     VANCOMYCIN <=0.5 SENSITIVE Sensitive     TRIMETH/SULFA 80 RESISTANT Resistant     CLINDAMYCIN 0.5 SENSITIVE Sensitive     RIFAMPIN <=0.5 SENSITIVE Sensitive     Inducible Clindamycin NEGATIVE Sensitive     * STAPHYLOCOCCUS SPECIES (COAGULASE NEGATIVE)  Urine culture     Status: None   Collection Time: 05/06/17  9:21 PM  Result Value Ref Range Status   Specimen Description URINE, CATHETERIZED  Final   Special Requests NONE  Final   Culture   Final    NO GROWTH Performed at [facility], [address] [address] [address] [place], [place] [address]    Report Status 05/08/2017 FINAL  Final  MRSA PCR Screening     Status: None   Collection Time: 05/08/17  2:26 AM  Result Value Ref Range Status   MRSA by PCR NEGATIVE NEGATIVE Final    Comment:        The GeneXpert MRSA Assay (FDA approved for NASAL specimens only), is one component of a comprehensive MRSA colonization surveillance program. It is not intended to diagnose MRSA infection nor to guide or monitor treatment for MRSA infections. Performed at [facility], [address] [place], [place] [address]   Culture, blood (Routine X 2) w Reflex to ID Panel     Status: None (Preliminary result)   Collection Time: 05/09/17  2:45 PM  Result Value Ref Range Status   Specimen Description BLOOD RIGHT ANTECUBITAL  Final   Special Requests   Final    BOTTLES DRAWN AEROBIC AND ANAEROBIC Blood Culture adequate volume   Culture   Final    NO GROWTH 3 DAYS Performed at [facility], [address] [place], [place] [address]    Report Status PENDING  Incomplete  Culture, blood (Routine X 2) w Reflex to ID Panel      Status: None (Preliminary result)   Collection Time: 05/09/17  3:00 PM  Result Value Ref Range Status   Specimen Description BLOOD RIGHT HAND  Final   Special Requests   Final    BOTTLES DRAWN AEROBIC AND ANAEROBIC Blood Culture adequate volume   Culture  Final    NO GROWTH 3 DAYS Performed at [facility], [address], [place], [place] [address]    Report Status PENDING  Incomplete     Labs: BNP (last 3 results) Recent Labs    05/06/17 2023  BNP 53.0   Basic Metabolic Panel: Recent Labs  Lab 05/08/17 0242 05/09/17 0555 05/10/17 0633 05/11/17 0635 05/12/17 0818  NA 152* 142 143 139 141  K 2.4* 3.0* 3.2* 4.2 3.6  CL 117* 110 109 109 106  CO2 24 20* 23 19* 24  GLUCOSE 132* 121* 120* 108* 113*  BUN 20 7 6 8 8   CREATININE 1.07* 0.77 0.87 0.76 0.70  CALCIUM 8.7* 9.0 9.2 8.8* 9.3  MG 2.0  --   --   --   --    Liver Function Tests: Recent Labs  Lab 05/07/17 0041  AST 33  ALT 15  ALKPHOS 52  BILITOT 0.6  PROT 5.3*  ALBUMIN 2.3*   No results for input(s): LIPASE, AMYLASE in the last 168 hours. No results for input(s): AMMONIA in the last 168 hours. CBC: Recent Labs  Lab 05/06/17 2023 05/08/17 0242 05/09/17 0555 05/10/17 0633 05/12/17 0818  WBC 16.7* 16.6* 10.0 8.5 8.9  NEUTROABS 13.7*  --   --   --   --   HGB 14.5 11.1* 11.8* 11.0* 11.6*  HCT 47.9* 36.8 37.7 35.1* 36.0  MCV 96.8 96.6 92.9 92.4 91.4  PLT 306 198 210 214 231   Cardiac Enzymes: Recent Labs  Lab 05/07/17 0041 05/07/17 0605 05/07/17 1206  TROPONINI 0.20* 0.14* 0.06*   BNP: Invalid input(s): POCBNP CBG: Recent Labs  Lab 05/13/17 0027 05/13/17 0410 05/13/17 0539 05/13/17 0751 05/13/17 1227  GLUCAP 133* 123* 118* 139* 99   D-Dimer No results for input(s): DDIMER in the last 72 hours. Hgb A1c No results for input(s): HGBA1C in the last 72 hours. Lipid Profile No results for input(s): CHOL, HDL, LDLCALC, TRIG, CHOLHDL, LDLDIRECT in the last 72 hours. Thyroid function  studies No results for input(s): TSH, T4TOTAL, T3FREE, THYROIDAB in the last 72 hours.  Invalid input(s): FREET3 Anemia work up No results for input(s): VITAMINB12, FOLATE, FERRITIN, TIBC, IRON, RETICCTPCT in the last 72 hours. Urinalysis    Component Value Date/Time   COLORURINE AMBER (A) 05/06/2017 2024   APPEARANCEUR CLOUDY (A) 05/06/2017 2024   LABSPEC 1.026 05/06/2017 2024   PHURINE 5.0 05/06/2017 2024   GLUCOSEU 50 (A) 05/06/2017 2024   HGBUR SMALL (A) 05/06/2017 2024   BILIRUBINUR NEGATIVE 05/06/2017 2024   KETONESUR 5 (A) 05/06/2017 2024   PROTEINUR >=300 (A) 05/06/2017 2024   NITRITE NEGATIVE 05/06/2017 2024   LEUKOCYTESUR NEGATIVE 05/06/2017 2024   Sepsis Labs Invalid input(s): PROCALCITONIN,  WBC,  LACTICIDVEN Microbiology Recent Results (from the past 240 hour(s))  Blood culture (routine x 2)     Status: Abnormal   Collection Time: 05/06/17  8:45 PM  Result Value Ref Range Status   Specimen Description BLOOD LEFT ARM  Final   Special Requests   Final    BOTTLES DRAWN AEROBIC AND ANAEROBIC Blood Culture adequate volume   Culture  Setup Time   Final    GRAM POSITIVE COCCI ANAEROBIC BOTTLE ONLY Organism ID to follow CRITICAL RESULT CALLED TO, READ BACK BY AND VERIFIED WITH: H BAIRD PHARMD 2103 05/07/17 A BROWNING    Culture (A)  Final    STAPHYLOCOCCUS SPECIES (COAGULASE NEGATIVE) SUSCEPTIBILITIES PERFORMED ON PREVIOUS CULTURE WITHIN THE LAST 5 DAYS. Performed at  [facility], [address] [place] [place], [place] [address]    Report Status 05/09/2017 FINAL  Final  Blood Culture ID Panel (Reflexed)     Status: Abnormal   Collection Time: 05/06/17  8:45 PM  Result Value Ref Range Status   Enterococcus species NOT DETECTED NOT DETECTED Final   Listeria monocytogenes NOT DETECTED NOT DETECTED Final   Staphylococcus species DETECTED (A) NOT DETECTED Final    Comment: Methicillin (oxacillin) resistant coagulase negative staphylococcus. Possible blood culture  contaminant (unless isolated from more than one blood culture draw or clinical case suggests pathogenicity). No antibiotic treatment is indicated for blood  culture contaminants. CRITICAL RESULT CALLED TO, READ BACK BY AND VERIFIED WITH: H BAIRD PHARMD 2103 05/07/17 A BROWNING    Staphylococcus aureus NOT DETECTED NOT DETECTED Final   Methicillin resistance DETECTED (A) NOT DETECTED Final    Comment: CRITICAL RESULT CALLED TO, READ BACK BY AND VERIFIED WITH: H BAIRD PHARMD 2103 05/07/17 A BROWNING    Streptococcus species NOT DETECTED NOT DETECTED Final   Streptococcus agalactiae NOT DETECTED NOT DETECTED Final   Streptococcus pneumoniae NOT DETECTED NOT DETECTED Final   Streptococcus pyogenes NOT DETECTED NOT DETECTED Final   Acinetobacter baumannii NOT DETECTED NOT DETECTED Final   Enterobacteriaceae species NOT DETECTED NOT DETECTED Final   Enterobacter cloacae complex NOT DETECTED NOT DETECTED Final   Escherichia coli NOT DETECTED NOT DETECTED Final   Klebsiella oxytoca NOT DETECTED NOT DETECTED Final   Klebsiella pneumoniae NOT DETECTED NOT DETECTED Final   Proteus species NOT DETECTED NOT DETECTED Final   Serratia marcescens NOT DETECTED NOT DETECTED Final   Haemophilus influenzae NOT DETECTED NOT DETECTED Final   Neisseria meningitidis NOT DETECTED NOT DETECTED Final   Pseudomonas aeruginosa NOT DETECTED NOT DETECTED Final   Candida albicans NOT DETECTED NOT DETECTED Final   Candida glabrata NOT DETECTED NOT DETECTED Final   Candida krusei NOT DETECTED NOT DETECTED Final   Candida parapsilosis NOT DETECTED NOT DETECTED Final   Candida tropicalis NOT DETECTED NOT DETECTED Final    Comment: Performed at [facility], [address], [place], [place] [address]  Blood culture (routine x 2)     Status: Abnormal   Collection Time: 05/06/17  8:58 PM  Result Value Ref Range Status   Specimen Description BLOOD RIGHT HAND  Final   Special Requests   Final    BOTTLES DRAWN AEROBIC  ONLY Blood Culture results may not be optimal due to an inadequate volume of blood received in culture bottles   Culture  Setup Time   Final    GRAM POSITIVE COCCI AEROBIC BOTTLE ONLY CRITICAL RESULT CALLED TO, READ BACK BY AND VERIFIED WITHCindra Presume [facility] 2103 05/07/17 A BROWNING Performed at [facility], [address] [place], [place] [address]    Culture STAPHYLOCOCCUS SPECIES (COAGULASE NEGATIVE) (A)  Final   Report Status 05/09/2017 FINAL  Final   Organism ID, Bacteria STAPHYLOCOCCUS SPECIES (COAGULASE NEGATIVE)  Final      Susceptibility   Staphylococcus species (coagulase negative) - MIC*    CIPROFLOXACIN <=0.5 SENSITIVE Sensitive     ERYTHROMYCIN >=8 RESISTANT Resistant     GENTAMICIN <=0.5 SENSITIVE Sensitive     OXACILLIN >=4 RESISTANT Resistant     TETRACYCLINE >=16 RESISTANT Resistant     VANCOMYCIN <=0.5 SENSITIVE Sensitive     TRIMETH/SULFA 80 RESISTANT Resistant     CLINDAMYCIN 0.5 SENSITIVE Sensitive     RIFAMPIN <=0.5 SENSITIVE Sensitive  Inducible Clindamycin NEGATIVE Sensitive     * STAPHYLOCOCCUS SPECIES (COAGULASE NEGATIVE)  Urine culture     Status: None   Collection Time: 05/06/17  9:21 PM  Result Value Ref Range Status   Specimen Description URINE, CATHETERIZED  Final   Special Requests NONE  Final   Culture   Final    NO GROWTH Performed at [facility], [address] [place], [place] [address]    Report Status 05/08/2017 FINAL  Final  MRSA PCR Screening     Status: None   Collection Time: 05/08/17  2:26 AM  Result Value Ref Range Status   MRSA by PCR NEGATIVE NEGATIVE Final    Comment:        The GeneXpert MRSA Assay (FDA approved for NASAL specimens only), is one component of a comprehensive MRSA colonization surveillance program. It is not intended to diagnose MRSA infection nor to guide or monitor treatment for MRSA infections. Performed at [facility], [address], [place], [place] [address]   Culture, blood  (Routine X 2) w Reflex to ID Panel     Status: None (Preliminary result)   Collection Time: 05/09/17  2:45 PM  Result Value Ref Range Status   Specimen Description BLOOD RIGHT ANTECUBITAL  Final   Special Requests   Final    BOTTLES DRAWN AEROBIC AND ANAEROBIC Blood Culture adequate volume   Culture   Final    NO GROWTH 3 DAYS Performed at [facility], [address] [place], [place] [address]    Report Status PENDING  Incomplete  Culture, blood (Routine X 2) w Reflex to ID Panel     Status: None (Preliminary result)   Collection Time: 05/09/17  3:00 PM  Result Value Ref Range Status   Specimen Description BLOOD RIGHT HAND  Final   Special Requests   Final    BOTTLES DRAWN AEROBIC AND ANAEROBIC Blood Culture adequate volume   Culture   Final    NO GROWTH 3 DAYS Performed at [facility], [address] [place], [place] [address]    Report Status PENDING  Incomplete     Time coordinating discharge: Over 30 minutes  SIGNED:   Alba Cory, MD  [facility] 05/13/2017, 1:45 PM Pager   If 7PM-7AM, please contact night-coverage [email or web address] Password [email or web address]

## 2017-05-13 NOTE — Clinical Social Work Placement (Addendum)
   CLINICAL SOCIAL WORK PLACEMENT  NOTE [facility] RN to call report to [phone]  Date:  05/13/2017  Patient Details  Name: Rachel Vang MRN: [id number] Date of Birth: 08/20/1965  Clinical Social Work is seeking post-discharge placement for this patient at the Skilled  Nursing Facility level of care (*CSW will initial, date and re-position this form in  chart as items are completed):  Yes   Patient/family provided with [facility] Clinical Social Work Department's list of facilities offering this level of care within the geographic area requested by the patient (or if unable, by the patient's family).  Yes   Patient/family informed of their freedom to choose among providers that offer the needed level of care, that participate in Medicare, Medicaid or managed care program needed by the patient, have an available bed and are willing to accept the patient.  Yes   Patient/family informed of [facility]'s ownership interest in [facility] and [facility], as well as of the fact that they are under no obligation to receive care at these facilities.  PASRR submitted to EDS on       PASRR number received on       Existing PASRR number confirmed on 05/09/17     FL2 transmitted to all facilities in geographic area requested by pt/family on 05/09/17     FL2 transmitted to all facilities within larger geographic area on       Patient informed that his/her managed care company has contracts with or will negotiate with certain facilities, including the following:        Yes   Patient/family informed of bed offers received.  Patient chooses bed at Other - please specify in the comment section below:([facility])     Physician recommends and patient chooses bed at      Patient to be transferred to Other - please specify in the comment section below:([facility]) on 05/13/17.  Patient to be transferred to facility by [facility]     Patient family notified on 05/13/17 of  transfer.  Name of family member notified:  Les PouCarlton, son     PHYSICIAN Please prepare priority discharge summary, including medications, Please prepare prescriptions     Additional Comment:    _______________________________________________ Doy HutchingIsabel H Alekhya Gravlin, LCSWA 05/13/2017, 1:48 PM

## 2017-05-14 ENCOUNTER — Non-Acute Institutional Stay (SKILLED_NURSING_FACILITY): Payer: Medicaid Other | Admitting: Adult Health

## 2017-05-14 ENCOUNTER — Encounter: Payer: Self-pay | Admitting: Adult Health

## 2017-05-14 DIAGNOSIS — F458 Other somatoform disorders: Secondary | ICD-10-CM

## 2017-05-14 DIAGNOSIS — E1149 Type 2 diabetes mellitus with other diabetic neurological complication: Secondary | ICD-10-CM | POA: Diagnosis not present

## 2017-05-14 DIAGNOSIS — K219 Gastro-esophageal reflux disease without esophagitis: Secondary | ICD-10-CM | POA: Diagnosis not present

## 2017-05-14 DIAGNOSIS — E785 Hyperlipidemia, unspecified: Secondary | ICD-10-CM

## 2017-05-14 DIAGNOSIS — I639 Cerebral infarction, unspecified: Secondary | ICD-10-CM | POA: Diagnosis not present

## 2017-05-14 DIAGNOSIS — R339 Retention of urine, unspecified: Secondary | ICD-10-CM

## 2017-05-14 DIAGNOSIS — I10 Essential (primary) hypertension: Secondary | ICD-10-CM | POA: Diagnosis not present

## 2017-05-14 DIAGNOSIS — E1169 Type 2 diabetes mellitus with other specified complication: Secondary | ICD-10-CM | POA: Diagnosis not present

## 2017-05-14 DIAGNOSIS — I825Y2 Chronic embolism and thrombosis of unspecified deep veins of left proximal lower extremity: Secondary | ICD-10-CM

## 2017-05-14 DIAGNOSIS — R7881 Bacteremia: Secondary | ICD-10-CM | POA: Diagnosis not present

## 2017-05-14 DIAGNOSIS — R569 Unspecified convulsions: Secondary | ICD-10-CM

## 2017-05-14 DIAGNOSIS — I2782 Chronic pulmonary embolism: Secondary | ICD-10-CM | POA: Diagnosis not present

## 2017-05-14 DIAGNOSIS — I471 Supraventricular tachycardia: Secondary | ICD-10-CM | POA: Diagnosis not present

## 2017-05-14 LAB — HEPATIC FUNCTION PANEL
ALT: 29 (ref 7–35)
AST: 15 (ref 13–35)
Alkaline Phosphatase: 108 (ref 25–125)
Bilirubin, Total: 0.2

## 2017-05-14 LAB — CBC AND DIFFERENTIAL
HEMATOCRIT: 34 — AB (ref 36–46)
Hemoglobin: 10.9 — AB (ref 12.0–16.0)
NEUTROS ABS: 8
PLATELETS: 356 (ref 150–399)
WBC: 11.2

## 2017-05-14 LAB — CULTURE, BLOOD (ROUTINE X 2)
CULTURE: NO GROWTH
Culture: NO GROWTH
SPECIAL REQUESTS: ADEQUATE
Special Requests: ADEQUATE

## 2017-05-14 LAB — BASIC METABOLIC PANEL
BUN: 12 (ref 4–21)
CREATININE: 0.8 (ref 0.5–1.1)
GLUCOSE: 161
Potassium: 4.3 (ref 3.4–5.3)
SODIUM: 144 (ref 137–147)

## 2017-05-14 NOTE — Progress Notes (Signed)
Location:   [place] Room Number: 208 A Place of Service:  SNF (31)   CODE STATUS: Full Code (Most form updated 08-21-16)  No Known Allergies  Chief Complaint  Patient presents with  . Hospitalization Follow-up    Hospital Follow up    HPI:  She is a 52 year old long term resident who has been hospitalized from 05-06-17 through 05-13-17. She was taken to the ED due to tachycardia and tachypnea. She was found to febrile with heart rate 171. She was treated for sepsis treated for pneumonia; SBO her robinul has been stopped. She was treated for bacteremia 2 blood cultures positive no vegetation per echo will need to complete IV vancomycin. She was treated for acute renal failure. She is unable to participate in the phi or ros. She is lethargic; pulse rate in the 140s no signs of distress temp 99.5. She is aware of her surroundings. She will continue to be followed for her chronic illnesses including: hypertension; seizure; dysphagia.   Past Medical History:  Diagnosis Date  . Acute pulmonary embolism ([place]) 02/19/2015  . Acute respiratory failure ([place])   . Diabetes mellitus without complication (HCC)    Type 2, W/o complications  . DVT (deep venous thrombosis) ([place]) 04/05/2015  . Dysphagia   . Epilepsy ([place])   . GERD (gastroesophageal reflux disease)   . Hyperlipidemia   . Hypertension   . IBS (irritable bowel syndrome)   . Nontraumatic subarachnoid hemorrhage ([place])   . SAH (subarachnoid hemorrhage) ([place])   . Urinary retention     Past Surgical History:  Procedure Laterality Date  . ABDOMINAL SURGERY    . ANEURYSM COILING    . COLONOSCOPY N/A 02/20/2015   Procedure: COLONOSCOPY;  Surgeon: Gatha Mayer, MD;  Location: [place];  Service: Endoscopy;  Laterality: N/A;  . ESOPHAGOGASTRODUODENOSCOPY (EGD) WITH PROPOFOL N/A 02/02/2015   Procedure: ESOPHAGOGASTRODUODENOSCOPY (EGD) WITH PROPOFOL;  Surgeon: Judeth Horn, MD;  Location: [facility] [place];  Service: General;   Laterality: N/A;  . IR GENERIC HISTORICAL  08/26/2015   IR GASTRIC TUBE PERC CHG W/O IMG GUIDE 08/26/2015 Aletta Edouard, MD [facility]  . IR GENERIC HISTORICAL  09/14/2015   IR REPLC GASTRO/COLONIC TUBE PERCUT W/FLUORO 09/14/2015 Darrell K Allred, PA-C [facility]  . IR REPLACE G-TUBE SIMPLE WO FLUORO  06/08/2016  . IR REPLACE G-TUBE SIMPLE WO FLUORO  10/24/2016  . IR REPLACE G-TUBE SIMPLE WO FLUORO  01/07/2017  . PEG PLACEMENT N/A 02/02/2015   Procedure: PERCUTANEOUS ENDOSCOPIC GASTROSTOMY (PEG) PLACEMENT;  Surgeon: Judeth Horn, MD;  Location: [place];  Service: General;  Laterality: N/A;  . RADIOLOGY WITH ANESTHESIA N/A 01/13/2015   Procedure: RADIOLOGY WITH ANESTHESIA;  Surgeon: Consuella Lose, MD;  Location: [facility] [facility];  Service: Radiology;  Laterality: N/A;  . TRACHEOSTOMY      Social History   Socioeconomic History  . Marital status: Single    Spouse name: Not on file  . Number of children: Not on file  . Years of education: Not on file  . Highest education level: Not on file  Occupational History  . Not on file  Social Needs  . Financial resource strain: Not on file  . Food insecurity:    Worry: Not on file    Inability: Not on file  . Transportation needs:    Medical: Not on file    Non-medical: Not on file  Tobacco Use  . Smoking status: Former Research scientist (life sciences)  . Smokeless tobacco: Never Used  Substance and Sexual Activity  . Alcohol use: No  . Drug use: No  . Sexual activity: Not on file  Lifestyle  . Physical activity:    Days per week: Not on file    Minutes per session: Not on file  . Stress: Not on file  Relationships  . Social connections:    Talks on phone: Not on file    Gets together: Not on file    Attends religious service: Not on file    Active member of club or organization: Not on file    Attends meetings of clubs or organizations: Not on file    Relationship status: Not on file  . Intimate partner violence:    Fear of current or ex partner: Not on  file    Emotionally abused: Not on file    Physically abused: Not on file    Forced sexual activity: Not on file  Other Topics Concern  . Not on file  Social History Narrative  . Not on file   Family History  Problem Relation Age of Onset  . Hypertension Other       VITAL SIGNS BP 128/90   Pulse (!) 142   Temp 98.9 F (37.2 C)   Resp 16   Ht 5' (1.524 m)   Wt 122 lb 6.4 oz (55.5 kg)   LMP  (LMP Unknown)   SpO2 98%   BMI 23.90 kg/m   Outpatient Encounter Medications as of 05/14/2017  Medication Sig Note  . acetaminophen (TYLENOL) 325 MG tablet Place 650 mg into feeding tube every 4 (four) hours as needed for mild pain or fever.    . bethanechol (URECHOLINE) 10 MG tablet Place 10 mg into feeding tube 3 (three) times daily.    Marland Kitchen enoxaparin (LOVENOX) 60 MG/0.6ML injection Inject 0.6 mLs (60 mg total) into the skin 2 (two) times daily.   . fluconazole (DIFLUCAN) 100 MG tablet Take 1 tablet (100 mg total) by mouth daily.   Marland Kitchen gabapentin (NEURONTIN) 100 MG capsule Take 200 mg by mouth 2 (two) times daily. HOLD IF LETHARGIC   . levETIRAcetam (KEPPRA) 100 MG/ML solution Place 5 mLs (500 mg total) into feeding tube 2 (two) times daily.   . Multiple Vitamins-Minerals (DECUBI-VITE) CAPS Place 1 capsule into feeding tube daily.    . Nutritional Supplements (FEEDING SUPPLEMENT, JEVITY 1.5 CAL,) LIQD Continue with 42 cc per hour and flush with 200 cc every 4 hours   . Nutritional Supplements (PROMOD PO) Give 60 mls into G tube three times daily   . polyethylene glycol powder (GLYCOLAX/MIRALAX) powder Place 17 g into feeding tube daily.    . Probiotic Product (PROBIOTIC DAILY) CAPS Place 1 capsule into feeding tube 2 (two) times daily. FOR 82 DAYS FOR GINGIVITIS   . ranitidine (ZANTAC) 150 MG tablet Place 150 mg into feeding tube daily.    . rosuvastatin (CRESTOR) 20 MG tablet Place 20 mg into feeding tube at bedtime.    . traZODone (DESYREL) 50 MG tablet Place 50 mg into feeding tube at  bedtime.   . vancomycin 500 mg in sodium chloride 0.9 % 100 mL Inject 500 mg into the vein every 12 (twelve) hours for 9 days.   . Water For Irrigation, Sterile (FREE WATER) SOLN Place 200 mLs into feeding tube 4 (four) times daily.       SIGNIFICANT DIAGNOSTIC EXAMS  PREVIOUS  06-14-16: diagnostic right mammogram: benign cyst  12-31-16: right lower extremity doppler: no thrombus identified; unfortunately  obscured exam. Right superficial femoral vein cannot be identified due to patient positioning. The profunda femoral and popliteal were patient. Right posterior tibial was obscured.   02-22-17: ct of abdomen and pelvis Bladder wall thickening with hazy irregular perivesicular margins compatible with cystitis; recommend correlation with urinalysis. Minimal sigmoid diverticulosis. RIGHT lower lobe infiltrate question pneumonia or aspiration. No definite acute intra-abdominal or intrapelvic abnormalities otherwise seen.  Aortic Atherosclerosis  TODAY:   05-06-17: chest x-ray: No acute cardiopulmonary abnormality seen.  05-06-17: ct angio of chest: 1. No pulmonary embolus identified. 2. Diffuse peribronchial thickening with debris in the mainstem bronchi and extending into the lower lobes. Findings may represent acute bronchitis or aspiration given the distribution. No consolidation. 3. Mildly patulous esophagus. 4. Aorta and coronary artery calcific atherosclerosis.  05-07-17: kub: 1. Dilated loops of small bowel with very little colonic bowel gas. Possible ileus but cannot exclude partial small bowel obstruction. CT the abdomen pelvis may be helpful if warranted. 2. Gastrostomy tube is present. 3. Osteopenia.   05-07-17: ct of abdomen and pelvis:  1. Focally dilated segment of small bowel within the right abdomen with abrupt transition to decompressed distal small bowel in the central upper pelvis with decompressed distal small bowel and colon; overall pattern favors a mechanical small bowel  obstruction. Given the focal appearance and right-sided distribution of the dilated small bowel, could consider closed loop obstruction although no definite mesenteric swirling or mesenteric vascular crowding is identified. Negative for intramural air or free air. 2. Development of trace right pleural effusion with consolidation at the right base which may reflect atelectasis or pneumonia 3. Patchy increased cortical density in the right kidney, suspect for delayed nephrogram as may be seen with compromised renal function. 4. Layering stones or sludge within the gallbladder.   05-08-17: 2-d echo: - Left ventricle: The cavity size was normal. There was mild concentric hypertrophy. Systolic function was vigorous. The estimated ejection fraction was in the range of 65% to 70%. Wall motion was normal; there were no regional wall motion abnormalities. Doppler parameters are consistent with abnormal left ventricular relaxation (grade 1 diastolic dysfunction). There was no evidence of elevated ventricular filling pressure by  Doppler parameters. - Aortic valve: There was no regurgitation. - Aortic root: The aortic root was normal in size. - Mitral valve: There was no regurgitation. - Left atrium: The atrium was normal in size. - Right ventricle: Systolic function was normal. - Right atrium: The atrium was normal in size. - Pulmonic valve: There was no regurgitation. - Inferior vena cava: The vessel was normal in size. - Pericardium, extracardiac: A mild anteriorly located pericardial effusion was identified. Features were not consistent with  tamponade physiology.  05-09-17: right foot x-ray: 1. No radiographic findings of osteomyelitis. 2. Osteoporosis.  05-12-17: MRI right knee: 1. Incomplete study as only three axial sequences were obtained. The patient refused further imaging due to pain. 2. No joint effusion, excluding septic arthritis. 3. 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 No evidence of  osteomyelitis.  05-13-17: MRI right ankle: 1. No osteomyelitis of right ankle. 2. No MRI evidence of septic arthritis of the right ankle. 3. Soft tissue ulcer overlying the posterior calcaneus.     LABS REVIEWED: PREVIOUS   06-14-16: wbc 8.4; hgb 11.9; hct 36.6; mcv 95.4; plt 203; glucose 134; bun 30.7; creat 0.62; k+ 4.5 ;na++ 149; liver normal albumin 4.2; vit B 12: 521; folate 19.1; chol 155; ldl 85; trig 181; hdl 34; hgb a1c 5.7  8-18:  hgb a1c 5.4 09-24-16: urine micro-albumin <1.2 10-11-16: glucose 133; bun 25.6; creat 0.63; k+ 4.8; na++ 140; ca 9.9; liver normal albumin 4.3 10-22-16: glucose 131; bun 29.0; creat 0.62; k+ 4.7; na++ 141; ca 9.8; liver normal albumin 4.3 01-06-17: wb 8.1; hgb 15.0; hct 45.0; mcv  93.2; plt 199; glucose 99; bun 20; creat 0.74; k+ 4.0; na++ 142; ca 10.7  01-25-17: hgb a1c 6.1; chol 187; ldl 117; trig 184; hdl 31  02-14-17: wbc 28.0; hgb 13.9; hct 41.0; mcv 90.5 ;plt 297  glucose 165; bun 34.4; creat 1.04; k+ 4.1; NA++ 156; ALT 38 AST 22; alk phos  133; albumin 4.2 blood culture: no growth urine culture no growth 02-14-17 (ED):  Wbc 33.7; hgb 13.4; hct 42.8; mcv 99.5; plt 279 glucose 210; bun 41; creat 1.37; k+ 3.6; na++ 155; ca 9.9; liver normal albumin 3.4  02-15-17: blood culture: no growth; flu: neg HIV: nr; hgb a1c 6.1 02-17-17: wbc 18.4; hgb 9.4; hct 31.6; mcv 97.8; plt 214; glucose 100; bun 23; creat 0.75; k+ 3.2; na++150 liver normal albumin 2.4 mag 2.3 phos 3.0  02-22-17: wbc 13.3; hgb 10.5; hct 33.6; mcv 94.6; plt 424; glucose 102; bun 16; creat 0.58; k+ 4.0; na++ 138; ca 9.9 02-24-17: wbc  11.2; hgb 11.2; hct 35.2; mcv 95.7; plt 387; glucose 115; bun 11; creat 0.57; k+ 4.2; na++ 138; ca 9.4  TODAY:  05-06-17: wbc 16.7; hgb 14.5; hct  47.9; mcv 96.8; plt 306; glucose 251; bun 68; creat 2.68; k+ 3.5; na++ 154; ca 9.1; blood culture: MRSA: urine culture: no growth 05-07-17: glucose 109; bun 60; creat 2.10; k+ 4.7; na++ 158; ca 7.4 ;liver normal albumin  2.3 05-09-17: wbc 10.0; hgb 11.8; hct 37.7; mcv 92.9; plt 210; glucose 121; bun 7; creat 0.77; k+ 3.0; na++ 142; ca 9.0; blood culture: no growth 05-10-17: wbc 8.5; hgb 11.0; hct 35.1; mcv 92.4; plt 214; glucose 120; bun 6; creat 0.87; k+ 3.2; na++ 143; ca 9.2; hepatitis: neg 05-12-17: wbc 8.9; hgb 11.6; hct 36.0; mcv 91.4; plt 231; glucose 113; bun 8; creat 0.70; k+ 3.6; na++ 141; ca 9.3    Review of Systems  Unable to perform ROS: Other (expressive aphasia )    Physical Exam  Constitutional: She appears well-developed and well-nourished. No distress.  Neck: Carotid bruit is present. No thyromegaly present.  Cardiovascular: Normal rate, regular rhythm, normal heart sounds and intact distal pulses.  Pulmonary/Chest: Effort normal and breath sounds normal. No respiratory distress.  Abdominal: Soft. Bowel sounds are normal. She exhibits no distension. There is no tenderness.  Peg tube present without signs of infection present   Musculoskeletal: She exhibits edema.  Left hemiplegia Right lower extremity with edema; tenderness and pain present    Lymphadenopathy:    She has no cervical adenopathy.  Neurological: She is alert.  Skin: Skin is warm and dry. She is not diaphoretic.  Stage II right posterior 0.5 x 1.5 cm without signs of infection present       ASSESSMENT/ PLAN:  TODAY  1. Urine retention: stable will continue urecholine 10 mg three times daily  ; will not make changes will not make changes.   2. Type 2 diabetes mellitus with neurological manifestations: is stable  hgb a1c is 5.4 (previous 5.7) ; is currently off medications; will continue to monitor her status.  ldl is 85   3. Seizures: is stable  no reports of seizure activity present: will continue keppra 500 mg twice daily and will monitor  4. DVT (chronic left lower extremity) and chronic pulmonary embolism: is stable  she does require long term anticoagulation therapy; will continue lovenox 60  mg twice daily (weigt  based); is not a candidate for xarelto; or eliquis due to her history of GI bleed; she is not appropriate for coumadin therapy; unable  to obtain adequate INR.  6. Hypersecretion of saliva: is without change; her robinul has been stopped while hospitalized has SBO.    7. Essential benign hypertension with supraventricular tachycardia:  Worse: b/p 128/90 pulse: 140: will begin lopressor 25 mg twice daily   8.  Dysphagia paraesophageal  phase : stable  no signs of aspiration present  her po intake is not adequate to maintain her body weight; she is dependent upon peg tube feeding.  9. 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    10. Dyslipidemia associated with type 2 diabetes mellitus: without change continue crestor 20 mg daily will monitor   11. gerd without esophagitis: stable will continue zantac 150 mg daily   12. Bruxism: is without change; is awaiting dental appointment   13. Slow transit constipation: is status post SBO: will continue miralax 17 gm daily   14. Insomnia: is stable will continue trazodone 50 mg nightly   15. Sepsis:/aspiration pneumonia is unchanged; will have her complete vancomycin is on diflucan 100 mg daily   Will get stat cbc; cmp blood culture X2; kub ekg;     MD is aware of resident's narcotic use and is in agreement with current plan of care. We will attempt to wean resident as apropriate   Ok Edwards NP [facility]  Contact [phone] Monday through Friday 8am- 5pm  After hours call [phone]

## 2017-05-15 ENCOUNTER — Telehealth: Payer: Self-pay

## 2017-05-15 NOTE — Telephone Encounter (Signed)
Possible re-admission to facility. [facility] needed if patient was re-admitted to facility upon discharge. [facility]

## 2017-05-16 ENCOUNTER — Non-Acute Institutional Stay (SKILLED_NURSING_FACILITY): Payer: Medicaid Other | Admitting: Internal Medicine

## 2017-05-16 ENCOUNTER — Encounter: Payer: Self-pay | Admitting: Internal Medicine

## 2017-05-16 DIAGNOSIS — I10 Essential (primary) hypertension: Secondary | ICD-10-CM

## 2017-05-16 DIAGNOSIS — I69351 Hemiplegia and hemiparesis following cerebral infarction affecting right dominant side: Secondary | ICD-10-CM | POA: Diagnosis not present

## 2017-05-16 DIAGNOSIS — Z8719 Personal history of other diseases of the digestive system: Secondary | ICD-10-CM | POA: Diagnosis not present

## 2017-05-16 DIAGNOSIS — R569 Unspecified convulsions: Secondary | ICD-10-CM | POA: Diagnosis not present

## 2017-05-16 NOTE — Progress Notes (Signed)
Patient ID: Rachel Vang, female   DOB: 02-Dec-1965, 52 y.o.   MRN: [id number]  Provider:  DR Elmon Kirschner Location:  [facility] Room Number: 208 [id number] Place of Service:  SNF (31)  PCP: Kirt Boys, DO Patient Care Team: Kirt Boys, DO as PCP - General (Internal Medicine) [place] Si Chong Sicilian, NP as Nurse Practitioner (Geriatric Medicine) [facility], [facility] (Skilled Nursing Facility)  Extended Emergency Contact Information Primary Emergency Contact: Silas [place] Mobile Phone: [phone] Relation: Son Secondary Emergency Contact: Debroah Ball[place] Mobile Phone: [phone] Relation: Son  Code Status: Full Code Goals of Care: Advanced Directive information Advanced Directives 05/16/2017  Does Patient Have a Medical Advance Directive? Yes  Type of Advance Directive Out of facility DNR (pink MOST or yellow form)  Does patient want to make changes to medical advance directive? No - Patient declined  Copy of Healthcare Power of Attorney in Chart? -  Would patient like information on creating a medical advance directive? No - Patient declined  Pre-existing out of facility DNR order (yellow form or pink MOST form) Pink MOST form placed in chart (order not valid for inpatient use)      Chief Complaint  Patient presents with  . Readmit To SNF    Readmission    HPI: Patient is a 52 y.o. female seen today for re-admission to SNF following hospital stay for severe sepsis 2/2 coag neg bacteremia, SBO, hypernatremia, AKI, aspiration pneumonia. Repeat BC NGTD. ID consulted. Surgery consulted. She underwent SBO protocol. She tolerated TF. IV abx given. abx for PNA complicated. IVF administered for AKI. Electrolytes repleted. Cr 2.68-->0.8; K 2.4-->4.3; WBC 16.7K-->11.2K; Hgb 10.9; CRP 3.7 at d/c. She presents to SNF for long term care.  Today she reports feeling well. No concerns. She is tolerating TF. No N/V.  No f/c. CBG 278 on 05/06/17. She had swelling in RLE and venos doppler US ordered - neg for DVT.  Urine retention - stable on urecholine 10 mg three times daily  DM - diet controlled. A1c 5.4% (previous 5.7%); LDL 85   Seizure d/o - stable on keppra 500 mg twice daily    Hx DVT (chronic left lower extremity) and chronic pulmonary embolism - she does require long term anticoagulation therapy; takes  lovenox 60  mg twice daily (weigt based); is not a candidate for xarelto or eliquis due to her history of GI bleed; she is not appropriate for coumadin therapy; unable  to obtain adequate INR.  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    Dyslipidemia - stable on crestor 20 mg daily   GERD - stable on zantac 150 mg daily   Bruxism - unchanged  Slow transit constipation - stable on miralax 17 gm daily; s/p recent SBO  Insomnia - stable on trazodone 50 mg nightly    Past Medical History:  Diagnosis Date  . Acute pulmonary embolism (HCC) 02/19/2015  . Acute respiratory failure (HCC)   . Diabetes mellitus without complication (HCC)    Type 2, W/o complications  . DVT (deep venous  thrombosis) (HCC) 04/05/2015  . Dysphagia   . Epilepsy (HCC)   . GERD (gastroesophageal reflux disease)   . Hyperlipidemia   . Hypertension   . IBS (irritable bowel syndrome)   . Nontraumatic subarachnoid hemorrhage (HCC)   . SAH (subarachnoid hemorrhage) (HCC)   . Urinary retention    Past Surgical History:  Procedure Laterality Date  . ABDOMINAL SURGERY    . ANEURYSM COILING    . COLONOSCOPY N/A 02/20/2015   Procedure: COLONOSCOPY;   Surgeon: Iva Boop, MD;  Location: [facility] ENDOSCOPY;  Service: Endoscopy;  Laterality: N/A;  . ESOPHAGOGASTRODUODENOSCOPY (EGD) WITH PROPOFOL N/A 02/02/2015   Procedure: ESOPHAGOGASTRODUODENOSCOPY (EGD) WITH PROPOFOL;  Surgeon: Jimmye Norman, MD;  Location: [facility] ENDOSCOPY;  Service: General;  Laterality: N/A;  . IR GENERIC HISTORICAL  08/26/2015   IR GASTRIC TUBE PERC CHG W/O IMG GUIDE 08/26/2015 Irish Lack, MD [facility]-INTERV RAD  . IR GENERIC HISTORICAL  09/14/2015   IR REPLC GASTRO/COLONIC TUBE PERCUT W/FLUORO 09/14/2015 Darrell K Allred, PA-C [facility]-INTERV RAD  . IR REPLACE G-TUBE SIMPLE WO FLUORO  06/08/2016  . IR REPLACE G-TUBE SIMPLE WO FLUORO  10/24/2016  . IR REPLACE G-TUBE SIMPLE WO FLUORO  01/07/2017  . PEG PLACEMENT N/A 02/02/2015   Procedure: PERCUTANEOUS ENDOSCOPIC GASTROSTOMY (PEG) PLACEMENT;  Surgeon: Jimmye Norman, MD;  Location: [facility] ENDOSCOPY;  Service: General;  Laterality: N/A;  . RADIOLOGY WITH ANESTHESIA N/A 01/13/2015   Procedure: RADIOLOGY WITH ANESTHESIA;  Surgeon: Lisbeth Renshaw, MD;  Location: [facility] OR;  Service: Radiology;  Laterality: N/A;  . TRACHEOSTOMY      reports that she has quit smoking. She has never used smokeless tobacco. She reports that she does not drink alcohol or use drugs. Social History   Socioeconomic History  . Marital status: Single    Spouse name: Not on file  . Number of children: Not on file  . Years of education: Not on file  . Highest education level: Not on file  Occupational History  . Not on file  Social Needs  . Financial resource strain: Not on file  . Food insecurity:    Worry: Not on file    Inability: Not on file  . Transportation needs:    Medical: Not on file    Non-medical: Not on file  Tobacco Use  . Smoking status: Former Games developer  . Smokeless tobacco: Never Used  Substance and Sexual Activity  . Alcohol use: No  . Drug use: No  . Sexual activity: Not on file  Lifestyle  . Physical activity:    Days per week: Not on file     Minutes per session: Not on file  . Stress: Not on file  Relationships  . Social connections:    Talks on phone: Not on file    Gets together: Not on file    Attends religious service: Not on file    Active member of club or organization: Not on file    Attends meetings of clubs or organizations: Not on file    Relationship status: Not on file  . Intimate partner violence:    Fear of current or ex partner: Not on file    Emotionally abused: Not on file    Physically abused: Not on file    Forced sexual activity: Not on file  Other Topics Concern  . Not on file  Social History Narrative  . Not on file    Functional Status Survey:    Family History  Problem Relation Age of Onset  .  Hypertension Other     Health Maintenance  Topic Date Due  . PNEUMOCOCCAL POLYSACCHARIDE VACCINE (1) 05/27/2017 (Originally 05/04/1967)  . OPHTHALMOLOGY EXAM  10/17/2017 (Originally 10/05/2016)  . FOOT EXAM  12/27/2017 (Originally 11/03/2016)  . HEMOGLOBIN A1C  08/15/2017  . INFLUENZA VACCINE  08/29/2017  . URINE MICROALBUMIN  09/24/2017  . MAMMOGRAM  06/15/2018  . COLONOSCOPY  02/19/2025  . Hepatitis C Screening  Completed  . HIV Screening  Completed  . PAP SMEAR  Discontinued  . TETANUS/TDAP  Discontinued    No Known Allergies  Outpatient Encounter Medications as of 05/16/2017  Medication Sig  . acetaminophen (TYLENOL) 325 MG tablet Place 650 mg into feeding tube every 4 (four) hours as needed for mild pain or fever.   . bethanechol (URECHOLINE) 10 MG tablet Place 10 mg into feeding tube 3 (three) times daily.   Marland Kitchen enoxaparin (LOVENOX) 60 MG/0.6ML injection Inject 0.6 mLs (60 mg total) into the skin 2 (two) times daily.  Marland Kitchen gabapentin (NEURONTIN) 100 MG capsule Take 200 mg by mouth 2 (two) times daily. HOLD IF LETHARGIC  . levETIRAcetam (KEPPRA) 100 MG/ML solution Place 5 mLs (500 mg total) into feeding tube 2 (two) times daily.  . metoprolol tartrate (LOPRESSOR) 25 MG tablet Take 25 mg by  mouth 2 (two) times daily. Hold id SBP < 100 apical pulse  . Multiple Vitamins-Minerals (DECUBI-VITE) CAPS Place 1 capsule into feeding tube daily.   . Nutritional Supplements (FEEDING SUPPLEMENT, JEVITY 1.5 CAL,) LIQD Continue with 42 cc per hour and flush with 200 cc every 4 hours  . Nutritional Supplements (PROMOD PO) Take 60 mLs by mouth 3 (three) times daily.  . polyethylene glycol powder (GLYCOLAX/MIRALAX) powder Place 17 g into feeding tube daily.   . Probiotic Product (PROBIOTIC DAILY) CAPS Place 1 capsule into feeding tube 2 (two) times daily. FOR 82 DAYS FOR GINGIVITIS  . ranitidine (ZANTAC) 150 MG tablet Place 150 mg into feeding tube daily.   . rosuvastatin (CRESTOR) 20 MG tablet Place 20 mg into feeding tube at bedtime.   . traZODone (DESYREL) 50 MG tablet Place 50 mg into feeding tube at bedtime.  . vancomycin 500 mg in sodium chloride 0.9 % 100 mL Inject 500 mg into the vein every 12 (twelve) hours for 9 days.  . Water For Irrigation, Sterile (FREE WATER) SOLN Place 200 mLs into feeding tube 4 (four) times daily.  . [DISCONTINUED] fluconazole (DIFLUCAN) 100 MG tablet Take 1 tablet (100 mg total) by mouth daily. (Patient not taking: Reported on 05/16/2017)  . [DISCONTINUED] Nutritional Supplements (PROMOD PO) Give 60 mls into G tube three times daily   No facility-administered encounter medications on file as of 05/16/2017.     Review of Systems  Unable to perform ROS: Other (expressive aphasia)    Vitals:   05/16/17 1117  BP: 120/70  Pulse: (!) 112  Resp: 20  Temp: 98.9 F (37.2 C)  SpO2: 98%  Weight: 122 lb 6.4 oz (55.5 kg)  Height: 5' (1.524 m)   Body mass index is 23.9 kg/m. Physical Exam  Constitutional: She appears well-developed and well-nourished.  HENT:  Mouth/Throat: Oropharynx is clear and moist. No oropharyngeal exudate.  MMM; no oral thrush;poor dentition  Eyes: Pupils are equal, round, and reactive to light. No scleral icterus.  Neck: Neck supple.  Carotid bruit is present (b/l systolic from chest). No tracheal deviation present. No thyromegaly present.  Cardiovascular: Normal rate, regular rhythm and intact distal pulses. Exam reveals no gallop and  no friction rub.  Murmur (2/6 SEM --carotid b/l) heard. +1 pedal pitting edema b/l. No calf TTP. Left arm PICC line intact with no redness or d/c at insertion site.  Pulmonary/Chest: Effort normal and breath sounds normal. No stridor. No respiratory distress. She has no wheezes. She has no rales.  Abdominal: Soft. Normal appearance and bowel sounds are normal. She exhibits no distension and no mass. There is no hepatomegaly. There is tenderness. There is no rigidity, no rebound and no guarding. No hernia.  Peg tube intact with no redness or d/c at insertion site. jevity TF running  Musculoskeletal: She exhibits edema and tenderness.  Lymphadenopathy:    She has no cervical adenopathy.  Neurological: She is alert.  Skin: Skin is warm and dry. No rash noted.  Psychiatric: She has a normal mood and affect. Her behavior is normal. Thought content normal.    Labs reviewed: Basic Metabolic Panel: Recent Labs    02/16/17 0614  02/17/17 0319  05/08/17 0242  05/10/17 1610 05/11/17 0635 05/12/17 0818 05/14/17  NA 151*   < >  --    < > 152*   < > 143 139 141 144  K 4.1   < >  --    < > 2.4*   < > 3.2* 4.2 3.6 4.3  CL 111   < >  --    < > 117*   < > 109 109 106  --   CO2 21*   < >  --    < > 24   < > 23 19* 24  --   GLUCOSE 124*   < >  --    < > 132*   < > 120* 108* 113*  --   BUN 15   < >  --    < > 20   < > CREATININE 0.88   < >  --    < > 1.07*   < > 0.87 0.76 0.70 0.8  CALCIUM 9.5   < >  --    < > 8.7*   < > 9.2 8.8* 9.3  --   MG  --   --  2.3  --  2.0  --   --   --   --   --   PHOS 2.3*  --  3.0  --   --   --   --   --   --   --    < > = values in this interval not displayed.   Liver Function Tests: Recent Labs    02/19/17 0757 02/20/17 0844 05/07/17 0041 05/14/17  AST  25 28 33 15  ALT 38 ALKPHOS 101 97 52 108  BILITOT 0.5 0.7 0.6  --   PROT 6.7 7.1 5.3*  --   ALBUMIN 2.6* 2.7* 2.3*  --    No results for input(s): LIPASE, AMYLASE in the last 8760 hours. No results for input(s): AMMONIA in the last 8760 hours. CBC: Recent Labs    02/20/17 0844  05/06/17 2023  05/09/17 0555 05/10/17 0633 05/12/17 0818 05/14/17  WBC 12.6*   < > 16.7*   < > 10.0 8.5 8.9 11.2  NEUTROABS 8.5*  --  13.7*  --   --   --   --  8  HGB 10.5*   < > 14.5   < > 11.8* 11.0* 11.6* 10.9*  HCT 33.0*   < > 47.9*   < >  37.7 35.1* 36.0 34*  MCV 94.8   < > 96.8   < > 92.9 92.4 91.4  --   PLT 320   < > 306   < > 210 214 231 356   < > = values in this interval not displayed.   Cardiac Enzymes: Recent Labs    05/07/17 0041 05/07/17 0605 05/07/17 1206  TROPONINI 0.20* 0.14* 0.06*   BNP: Invalid input(s): POCBNP Lab Results  Component Value Date   HGBA1C 6.1 (H) 02/15/2017   No results found for: TSH Lab Results  Component Value Date   VITAMINB12 521 06/14/2016   No results found for: FOLATE No results found for: IRON, TIBC, FERRITIN  Imaging and Procedures obtained prior to SNF admission: Ct Abdomen Pelvis Wo Contrast  Result Date: 05/07/2017 CLINICAL DATA:  Abdominal distension EXAM: CT ABDOMEN AND PELVIS WITHOUT CONTRAST TECHNIQUE: Multidetector CT imaging of the abdomen and pelvis was performed following the standard protocol without IV contrast. COMPARISON:  CT abdomen pelvis 02/22/2017, CT chest 05/06/2017 FINDINGS: Lower chest: Lung bases demonstrate trace right pleural effusion with development of patchy consolidation at the right lung base. Trace pericardial effusion. Hepatobiliary: No focal hepatic abnormality. Layering sludge or small stones in the gallbladder. No biliary dilatation Pancreas: Unremarkable. No pancreatic ductal dilatation or surrounding inflammatory changes. Spleen: Normal in size without focal abnormality. Adrenals/Urinary Tract: Adrenal  glands are within normal limits. No hydronephrosis. Patchy hyperdensity within the right renal cortex with small amount of excreted contrast in the collecting systems. Residual contrast within the urinary bladder. Stomach/Bowel: Gastrostomy tube in the body of the stomach. Stomach is decompressed. Dilated segment of small bowel in the right abdomen measuring up to 4.6 cm. Abrupt transition two decompressed caliber small bowel in the central pelvis, consistent with bowel obstruction. Moderate retained feces in the rectum. Mild colon diverticular disease. Small amount of radiopaque material within the distal colon. Vascular/Lymphatic: Moderate aortic atherosclerosis. No aneurysmal dilatation. No significantly enlarged lymph nodes. Reproductive: Status post hysterectomy. No adnexal masses. Other: Negative for free air or significant free fluid. Small hyperdense foci and gas within the subcutaneous fat of the abdominal wall anteriorly, possibly due to subcutaneous injections. Musculoskeletal: No acute or suspicious abnormality. IMPRESSION: 1. Focally dilated segment of small bowel within the right abdomen with abrupt transition to decompressed distal small bowel in the central upper pelvis with decompressed distal small bowel and colon; overall pattern favors a mechanical small bowel obstruction. Given the focal appearance and right-sided distribution of the dilated small bowel, could consider closed loop obstruction although no definite mesenteric swirling or mesenteric vascular crowding is identified. Negative for intramural air or free air. 2. Development of trace right pleural effusion with consolidation at the right base which may reflect atelectasis or pneumonia 3. Patchy increased cortical density in the right kidney, suspect for delayed nephrogram as may be seen with compromised renal function. 4. Layering stones or sludge within the gallbladder. Electronically Signed   By: Jasmine Pang M.D.   On: 05/07/2017  15:24   Dg Abd 1 View  Result Date: 05/07/2017 CLINICAL DATA:  In communicative patient, severe abdominal pain and tightness, vomiting, urinary retention EXAM: ABDOMEN - 1 VIEW COMPARISON:  CT abdomen pelvis of 02/22/2017 FINDINGS: Compared to the prior CT of the abdomen pelvis, there are dilated loops of small bowel present and very little colonic bowel gas is seen. Although this could indicate ileus, partial small bowel obstruction is a definite consideration. Gastrostomy tube again is noted. Free air cannot  be evaluated on the supine images. No opaque calculi are seen. Some contrast is noted within the urinary bladder. The bones are osteopenic. IMPRESSION: 1. Dilated loops of small bowel with very little colonic bowel gas. Possible ileus but cannot exclude partial small bowel obstruction. CT the abdomen pelvis may be helpful if warranted. 2. Gastrostomy tube is present. 3. Osteopenia. Electronically Signed   By: Dwyane Dee M.D.   On: 05/07/2017 11:22   Ct Angio Chest Pe W And/or Wo Contrast  Result Date: 05/06/2017 CLINICAL DATA:  52 y/o  F; altered mental status and tachycardia. EXAM: CT ANGIOGRAPHY CHEST WITH CONTRAST TECHNIQUE: Multidetector CT imaging of the chest was performed using the standard protocol during bolus administration of intravenous contrast. Multiplanar CT image reconstructions and MIPs were obtained to evaluate the vascular anatomy. CONTRAST:  ISOVUE-370 IOPAMIDOL (ISOVUE-370) INJECTION 76% COMPARISON:  02/17/2015 CT angiogram chest. FINDINGS: Cardiovascular: Normal heart size. No pericardial effusion. Normal caliber thoracic aorta with mild calcific atherosclerosis. Moderate coronary artery calcification. Normal caliber main pulmonary artery. Satisfactory opacification of the pulmonary arteries. Mediastinum/Nodes: No enlarged mediastinal, hilar, or axillary lymph nodes. Thyroid gland, trachea, and esophagus demonstrate no acute findings. Mildly patulous esophagus. Lungs/Pleura:  Diffuse peribronchial thickening with debris in the mainstem bronchi extending predominantly into the lower lobes bilaterally. No consolidation, effusion, or pneumothorax. Upper Abdomen: No acute abnormality. Musculoskeletal: No chest wall abnormality. No acute or significant osseous findings. Review of the MIP images confirms the above findings. IMPRESSION: 1. No pulmonary embolus identified. 2. Diffuse peribronchial thickening with debris in the mainstem bronchi and extending into the lower lobes. Findings may represent acute bronchitis or aspiration given the distribution. No consolidation. 3. Mildly patulous esophagus. 4. Aorta and coronary artery calcific atherosclerosis. Electronically Signed   By: Mitzi Hansen M.D.   On: 05/06/2017 22:46   Dg Chest Port 1 View  Result Date: 05/06/2017 CLINICAL DATA:  Shortness of breath. EXAM: PORTABLE CHEST 1 VIEW COMPARISON:  Radiograph February 20, 2017. FINDINGS: The heart size and mediastinal contours are within normal limits. Both lungs are clear. No pneumothorax or pleural effusion is noted. The visualized skeletal structures are unremarkable. IMPRESSION: No acute cardiopulmonary abnormality seen. Electronically Signed   By: Lupita Raider, M.D.   On: 05/06/2017 20:49    Assessment/Plan   ICD-10-CM   1. S/p small bowel obstruction Z87.19   2. Hemiparesis affecting right side as late effect of stroke (HCC) I69.351   3. Seizures (HCC) R56.9   4. Essential hypertension, benign I10     Cont current meds as ordered. Finish IV abx  PT/OT/ST as ordered  Peg tube care as indicated with water flushes as ordered  Left arm PICC line care as indicated  GOAL: short term rehab then continue long term care. Communicated with pt and nursing.  Will follow  Labs/tests ordered: cbc and bmp pending    Antania Hoefling S. Ancil Linsey  [facility] [address] [place], [place] [address] [phone] Cell  (Monday-Friday 8 AM - 5 PM) [phone] After 5 PM and follow prompts

## 2017-05-21 ENCOUNTER — Non-Acute Institutional Stay (SKILLED_NURSING_FACILITY): Payer: Medicaid Other | Admitting: Adult Health

## 2017-05-21 ENCOUNTER — Encounter: Payer: Self-pay | Admitting: Adult Health

## 2017-05-21 DIAGNOSIS — R569 Unspecified convulsions: Secondary | ICD-10-CM

## 2017-05-21 DIAGNOSIS — I825Y2 Chronic embolism and thrombosis of unspecified deep veins of left proximal lower extremity: Secondary | ICD-10-CM | POA: Diagnosis not present

## 2017-05-21 DIAGNOSIS — E1149 Type 2 diabetes mellitus with other diabetic neurological complication: Secondary | ICD-10-CM

## 2017-05-21 DIAGNOSIS — R339 Retention of urine, unspecified: Secondary | ICD-10-CM | POA: Diagnosis not present

## 2017-05-21 NOTE — Progress Notes (Signed)
Location:   [facility] Room Number: 208 A Place of Service:  SNF (31)   CODE STATUS: Full Code (Most form updated 08-21-16)  No Known Allergies  Chief Complaint  Patient presents with  . Medical Management of Chronic Issues    Dvt; diabetes; urine retention; seizure; weekly follow up for the first 30 days post hospitalization     HPI:  She is a 52 year old long term resident of this facility who is being seen for the management of her chronic illnesses: dvt; diabetes urine retention; seizures. She is unable to participate in the hpi or ros. There are no reports of aspiration; no uncontrolled pain; no excessive lethargy. There are no nursing concerns at this time.   Past Medical History:  Diagnosis Date  . Acute pulmonary embolism ([place]) 02/19/2015  . Acute respiratory failure ([place])   . Diabetes mellitus without complication (HCC)    Type 2, W/o complications  . DVT (deep venous thrombosis) ([place]) 04/05/2015  . Dysphagia   . Epilepsy ([place])   . GERD (gastroesophageal reflux disease)   . Hyperlipidemia   . Hypertension   . IBS (irritable bowel syndrome)   . Nontraumatic subarachnoid hemorrhage ([place])   . SAH (subarachnoid hemorrhage) ([place])   . Urinary retention     Past Surgical History:  Procedure Laterality Date  . ABDOMINAL SURGERY    . ANEURYSM COILING    . COLONOSCOPY N/A 02/20/2015   Procedure: COLONOSCOPY;  Surgeon: Gatha Mayer, MD;  Location: [facility];  Service: Endoscopy;  Laterality: N/A;  . ESOPHAGOGASTRODUODENOSCOPY (EGD) WITH PROPOFOL N/A 02/02/2015   Procedure: ESOPHAGOGASTRODUODENOSCOPY (EGD) WITH PROPOFOL;  Surgeon: Judeth Horn, MD;  Location: [facility];  Service: General;  Laterality: N/A;  . IR GENERIC HISTORICAL  08/26/2015   IR GASTRIC TUBE PERC CHG W/O IMG GUIDE 08/26/2015 Aletta Edouard, MD [facility]  . IR GENERIC HISTORICAL  09/14/2015   IR REPLC GASTRO/COLONIC TUBE PERCUT W/FLUORO 09/14/2015 Darrell K Allred, PA-C [facility]    . IR REPLACE G-TUBE SIMPLE WO FLUORO  06/08/2016  . IR REPLACE G-TUBE SIMPLE WO FLUORO  10/24/2016  . IR REPLACE G-TUBE SIMPLE WO FLUORO  01/07/2017  . PEG PLACEMENT N/A 02/02/2015   Procedure: PERCUTANEOUS ENDOSCOPIC GASTROSTOMY (PEG) PLACEMENT;  Surgeon: Judeth Horn, MD;  Location: [facility];  Service: General;  Laterality: N/A;  . RADIOLOGY WITH ANESTHESIA N/A 01/13/2015   Procedure: RADIOLOGY WITH ANESTHESIA;  Surgeon: Consuella Lose, MD;  Location: [facility];  Service: Radiology;  Laterality: N/A;  . TRACHEOSTOMY      Social History   Socioeconomic History  . Marital status: Single    Spouse name: Not on file  . Number of children: Not on file  . Years of education: Not on file  . Highest education level: Not on file  Occupational History  . Not on file  Social Needs  . Financial resource strain: Not on file  . Food insecurity:    Worry: Not on file    Inability: Not on file  . Transportation needs:    Medical: Not on file    Non-medical: Not on file  Tobacco Use  . Smoking status: Former Research scientist (life sciences)  . Smokeless tobacco: Never Used  Substance and Sexual Activity  . Alcohol use: No  . Drug use: No  . Sexual activity: Not on file  Lifestyle  . Physical activity:    Days per week: Not on file    Minutes per session: Not on file  .  Stress: Not on file  Relationships  . Social connections:    Talks on phone: Not on file    Gets together: Not on file    Attends religious service: Not on file    Active member of club or organization: Not on file    Attends meetings of clubs or organizations: Not on file    Relationship status: Not on file  . Intimate partner violence:    Fear of current or ex partner: Not on file    Emotionally abused: Not on file    Physically abused: Not on file    Forced sexual activity: Not on file  Other Topics Concern  . Not on file  Social History Narrative  . Not on file   Family History  Problem Relation Age of Onset  . Hypertension  Other       VITAL SIGNS BP 128/70   Pulse 98   Temp 98.9 F (37.2 C)   Resp 20   Ht 5' (1.524 m)   Wt 129 lb 4.8 oz (58.7 kg)   LMP  (LMP Unknown)   SpO2 98%   BMI 25.25 kg/m   Outpatient Encounter Medications as of 05/21/2017  Medication Sig  . acetaminophen (TYLENOL) 325 MG tablet Place 650 mg into feeding tube every 4 (four) hours as needed for mild pain or fever.   . bethanechol (URECHOLINE) 10 MG tablet Place 10 mg into feeding tube 3 (three) times daily.   Marland Kitchen enoxaparin (LOVENOX) 60 MG/0.6ML injection Inject 0.6 mLs (60 mg total) into the skin 2 (two) times daily.  Marland Kitchen gabapentin (NEURONTIN) 100 MG capsule Take 200 mg by mouth 2 (two) times daily. HOLD IF LETHARGIC  . levETIRAcetam (KEPPRA) 100 MG/ML solution Place 5 mLs (500 mg total) into feeding tube 2 (two) times daily.  . metoprolol tartrate (LOPRESSOR) 25 MG tablet Take 25 mg by mouth 2 (two) times daily. Hold id SBP < 100 apical pulse  . Multiple Vitamins-Minerals (DECUBI-VITE) CAPS Place 1 capsule into feeding tube daily.   . Nutritional Supplements (FEEDING SUPPLEMENT, JEVITY 1.5 CAL,) LIQD Continue with 42 cc per hour and flush with 200 cc every 4 hours  . Nutritional Supplements (PROMOD PO) Take 60 mLs by mouth 3 (three) times daily.  . polyethylene glycol powder (GLYCOLAX/MIRALAX) powder Place 17 g into feeding tube daily.   . Probiotic Product (PROBIOTIC DAILY) CAPS Place 1 capsule into feeding tube 2 (two) times daily. FOR 82 DAYS FOR GINGIVITIS  . ranitidine (ZANTAC) 150 MG tablet Place 150 mg into feeding tube daily.   . rosuvastatin (CRESTOR) 20 MG tablet Place 20 mg into feeding tube at bedtime.   . traZODone (DESYREL) 50 MG tablet Place 50 mg into feeding tube at bedtime.  . vancomycin 500 mg in sodium chloride 0.9 % 100 mL Inject 500 mg into the vein every 12 (twelve) hours for 9 days.  . Water For Irrigation, Sterile (FREE WATER) SOLN Place 200 mLs into feeding tube 4 (four) times daily.   No  facility-administered encounter medications on file as of 05/21/2017.      SIGNIFICANT DIAGNOSTIC EXAMS  PREVIOUS  06-14-16: diagnostic right mammogram: benign cyst  12-31-16: right lower extremity doppler: no thrombus identified; unfortunately obscured exam. Right superficial femoral vein cannot be identified due to patient positioning. The profunda femoral and popliteal were patient. Right posterior tibial was obscured.   02-22-17: ct of abdomen and pelvis Bladder wall thickening with hazy irregular perivesicular margins compatible with cystitis; recommend correlation  with urinalysis. Minimal sigmoid diverticulosis. RIGHT lower lobe infiltrate question pneumonia or aspiration. No definite acute intra-abdominal or intrapelvic abnormalities otherwise seen.  Aortic Atherosclerosis  05-06-17: chest x-ray: No acute cardiopulmonary abnormality seen.  05-06-17: ct angio of chest: 1. No pulmonary embolus identified. 2. Diffuse peribronchial thickening with debris in the mainstem bronchi and extending into the lower lobes. Findings may represent acute bronchitis or aspiration given the distribution. No consolidation. 3. Mildly patulous esophagus. 4. Aorta and coronary artery calcific atherosclerosis.  05-07-17: kub: 1. Dilated loops of small bowel with very little colonic bowel gas. Possible ileus but cannot exclude partial small bowel obstruction. CT the abdomen pelvis may be helpful if warranted. 2. Gastrostomy tube is present. 3. Osteopenia.   05-07-17: ct of abdomen and pelvis:  1. Focally dilated segment of small bowel within the right abdomen with abrupt transition to decompressed distal small bowel in the central upper pelvis with decompressed distal small bowel and colon; overall pattern favors a mechanical small bowel obstruction. Given the focal appearance and right-sided distribution of the dilated small bowel, could consider closed loop obstruction although no definite mesenteric swirling or  mesenteric vascular crowding is identified. Negative for intramural air or free air. 2. Development of trace right pleural effusion with consolidation at the right base which may reflect atelectasis or pneumonia 3. Patchy increased cortical density in the right kidney, suspect for delayed nephrogram as may be seen with compromised renal function. 4. Layering stones or sludge within the gallbladder.   05-08-17: 2-d echo: - Left ventricle: The cavity size was normal. There was mild concentric hypertrophy. Systolic function was vigorous. The estimated ejection fraction was in the range of 65% to 70%. Wall motion was normal; there were no regional wall motion abnormalities. Doppler parameters are consistent with abnormal left ventricular relaxation (grade 1 diastolic dysfunction). There was no evidence of elevated ventricular filling pressure by  Doppler parameters. - Aortic valve: There was no regurgitation. - Aortic root: The aortic root was normal in size. - Mitral valve: There was no regurgitation. - Left atrium: The atrium was normal in size. - Right ventricle: Systolic function was normal. - Right atrium: The atrium was normal in size. - Pulmonic valve: There was no regurgitation. - Inferior vena cava: The vessel was normal in size. - Pericardium, extracardiac: A mild anteriorly located pericardial effusion was identified. Features were not consistent with  tamponade physiology.  05-09-17: right foot x-ray: 1. No radiographic findings of osteomyelitis. 2. Osteoporosis.  05-12-17: MRI right knee: 1. Incomplete study as only three axial sequences were obtained. The patient refused further imaging due to pain. 2. No joint effusion, excluding septic arthritis. 3. 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 No evidence of osteomyelitis.  05-13-17: MRI right ankle: 1. No osteomyelitis of right ankle. 2. No MRI evidence of septic arthritis of the right ankle. 3. Soft tissue ulcer overlying the  posterior calcaneus.  NO NEW EXAMS      LABS REVIEWED: PREVIOUS   06-14-16: wbc 8.4; hgb 11.9; hct 36.6; mcv 95.4; plt 203; glucose 134; bun 30.7; creat 0.62; k+ 4.5 ;na++ 149; liver normal albumin 4.2; vit B 12: 521; folate 19.1; chol 155; ldl 85; trig 181; hdl 34; hgb a1c 5.7  8-18: hgb a1c 5.4 09-24-16: urine micro-albumin <1.2 10-11-16: glucose 133; bun 25.6; creat 0.63; k+ 4.8; na++ 140; ca 9.9; liver normal albumin 4.3 10-22-16: glucose 131; bun 29.0; creat 0.62; k+ 4.7; na++ 141; ca 9.8; liver normal albumin 4.3 01-06-17: wb 8.1;  hgb 15.0; hct 45.0; mcv  93.2; plt 199; glucose 99; bun 20; creat 0.74; k+ 4.0; na++ 142; ca 10.7  01-25-17: hgb a1c 6.1; chol 187; ldl 117; trig 184; hdl 31  02-14-17: wbc 28.0; hgb 13.9; hct 41.0; mcv 90.5 ;plt 297  glucose 165; bun 34.4; creat 1.04; k+ 4.1; NA++ 156; ALT 38 AST 22; alk phos  133; albumin 4.2 blood culture: no growth urine culture no growth 02-14-17 (ED):  Wbc 33.7; hgb 13.4; hct 42.8; mcv 99.5; plt 279 glucose 210; bun 41; creat 1.37; k+ 3.6; na++ 155; ca 9.9; liver normal albumin 3.4  02-15-17: blood culture: no growth; flu: neg HIV: nr; hgb a1c 6.1 02-17-17: wbc 18.4; hgb 9.4; hct 31.6; mcv 97.8; plt 214; glucose 100; bun 23; creat 0.75; k+ 3.2; na++150 liver normal albumin 2.4 mag 2.3 phos 3.0  02-22-17: wbc 13.3; hgb 10.5; hct 33.6; mcv 94.6; plt 424; glucose 102; bun 16; creat 0.58; k+ 4.0; na++ 138; ca 9.9 02-24-17: wbc  11.2; hgb 11.2; hct 35.2; mcv 95.7; plt 387; glucose 115; bun 11; creat 0.57; k+ 4.2; na++ 138; ca 9.4 05-06-17: wbc 16.7; hgb 14.5; hct  47.9; mcv 96.8; plt 306; glucose 251; bun 68; creat 2.68; k+ 3.5; na++ 154; ca 9.1; blood culture: MRSA: urine culture: no growth 05-07-17: glucose 109; bun 60; creat 2.10; k+ 4.7; na++ 158; ca 7.4 ;liver normal albumin 2.3 05-09-17: wbc 10.0; hgb 11.8; hct 37.7; mcv 92.9; plt 210; glucose 121; bun 7; creat 0.77; k+ 3.0; na++ 142; ca 9.0; blood culture: no growth 05-10-17: wbc 8.5; hgb 11.0; hct  35.1; mcv 92.4; plt 214; glucose 120; bun 6; creat 0.87; k+ 3.2; na++ 143; ca 9.2; hepatitis: neg 05-12-17: wbc 8.9; hgb 11.6; hct 36.0; mcv 91.4; plt 231; glucose 113; bun 8; creat 0.70; k+ 3.6; na++ 141; ca 9.3  NO NEW LABS     Review of Systems  Unable to perform ROS: Patient nonverbal    Physical Exam  Constitutional: She appears well-developed and well-nourished. No distress.  Neck: Carotid bruit is present. No thyromegaly present.  Cardiovascular: Normal rate, regular rhythm, normal heart sounds and intact distal pulses.  Pulmonary/Chest: Effort normal and breath sounds normal. No respiratory distress.  Abdominal: Soft. Bowel sounds are normal. She exhibits no distension. There is no tenderness.  Peg tube site without signs of infection present   Musculoskeletal: She exhibits no edema.  Left hemiplegia  Lymphadenopathy:    She has no cervical adenopathy.  Neurological: She is alert.  Skin: Skin is warm and dry. She is not diaphoretic.  Psychiatric: She has a normal mood and affect.     ASSESSMENT/ PLAN:  TODAY  1. Urine retention: stable will continue urecholine 10 mg three times daily  ; will not make changes will not make changes.   2. Type 2 diabetes mellitus with neurological manifestations: is stable  hgb a1c is 5.4 (previous 5.7) ; is currently off medications; will continue to monitor her status.  ldl is 85 is on statin.   3. Seizures: is stable  no reports of seizure activity present: will continue keppra 500 mg twice daily and will monitor   4. 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  PREVIOUS   6. Hypersecretion of saliva: is without change; her robinul has been  stopped while hospitalized had SBO.    7. Essential benign hypertension with supraventricular tachycardia:  Worse: b/p 128/70 pulse: 98: will continue lopressor 25 mg twice daily   8.  Dysphagia paraesophageal  phase : stable  no signs of aspiration present  her po intake is not adequate to maintain her body weight; she is dependent upon peg tube feeding.  9. 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    10. Dyslipidemia associated with type 2 diabetes mellitus: stable  continue crestor 20 mg daily will monitor   11. gerd without esophagitis: stable will continue zantac 150 mg daily   12. Bruxism: is without change; is awaiting dental appointment   13. Slow transit constipation: is status post SBO: will continue miralax 17 gm daily   14. Insomnia: is stable will continue trazodone 50 mg nightly   15. Sepsis:/aspiration pneumonia is unchanged; will have her complete vancomycin    MD is aware of resident's narcotic use and is in agreement with current plan of care. We will attempt to wean resident as apropriate   Ok Edwards NP [place] [facility]  Contact [phone] Monday through Friday 8am- 5pm  After hours call [phone]

## 2017-05-28 ENCOUNTER — Non-Acute Institutional Stay (SKILLED_NURSING_FACILITY): Payer: Medicaid Other | Admitting: Adult Health

## 2017-05-28 ENCOUNTER — Encounter: Payer: Self-pay | Admitting: Adult Health

## 2017-05-28 DIAGNOSIS — E1169 Type 2 diabetes mellitus with other specified complication: Secondary | ICD-10-CM | POA: Diagnosis not present

## 2017-05-28 DIAGNOSIS — R1314 Dysphagia, pharyngoesophageal phase: Secondary | ICD-10-CM | POA: Diagnosis not present

## 2017-05-28 DIAGNOSIS — I10 Essential (primary) hypertension: Secondary | ICD-10-CM | POA: Diagnosis not present

## 2017-05-28 DIAGNOSIS — E785 Hyperlipidemia, unspecified: Secondary | ICD-10-CM | POA: Diagnosis not present

## 2017-05-28 DIAGNOSIS — I639 Cerebral infarction, unspecified: Secondary | ICD-10-CM

## 2017-05-28 NOTE — Progress Notes (Signed)
Location:   [facility] Room Number: 208 A Place of Service:  SNF (31)   CODE STATUS: Full Code (Most form updated 08/21/16)  No Known Allergies  Chief Complaint  Patient presents with  . Medical Management of Chronic Issues    cva hypertension; dysphagia; dyslipidemia. Weekly follow up for first 30 days post hospitalization     HPI:  She is a 52 year old long term resident of this facility being seen for the management of her chronic illnesses; cva; hypertension; dysphagia; dyslipidemia. She has completed her abt for her pneumonia. She is unable to participate in the hpi or ros. There are no reports of fevers; no signs of aspiration present; no reports of uncontrolled pain. There are no nursing concerns at this time.   Past Medical History:  Diagnosis Date  . Acute pulmonary embolism ([place]) 02/19/2015  . Acute respiratory failure ([place])   . Diabetes mellitus without complication (HCC)    Type 2, W/o complications  . DVT (deep venous thrombosis) ([place]) 04/05/2015  . Dysphagia   . Epilepsy ([place])   . GERD (gastroesophageal reflux disease)   . Hyperlipidemia   . Hypertension   . IBS (irritable bowel syndrome)   . Nontraumatic subarachnoid hemorrhage ([place])   . SAH (subarachnoid hemorrhage) ([place])   . Urinary retention     Past Surgical History:  Procedure Laterality Date  . ABDOMINAL SURGERY    . ANEURYSM COILING    . COLONOSCOPY N/A 02/20/2015   Procedure: COLONOSCOPY;  Surgeon: Gatha Mayer, MD;  Location: [facility];  Service: Endoscopy;  Laterality: N/A;  . ESOPHAGOGASTRODUODENOSCOPY (EGD) WITH PROPOFOL N/A 02/02/2015   Procedure: ESOPHAGOGASTRODUODENOSCOPY (EGD) WITH PROPOFOL;  Surgeon: Judeth Horn, MD;  Location: [facility];  Service: General;  Laterality: N/A;  . IR GENERIC HISTORICAL  08/26/2015   IR GASTRIC TUBE PERC CHG W/O IMG GUIDE 08/26/2015 Aletta Edouard, MD [facility]  . IR GENERIC HISTORICAL  09/14/2015   IR REPLC GASTRO/COLONIC TUBE PERCUT  W/FLUORO 09/14/2015 Darrell K Allred, PA-C [facility]  . IR REPLACE G-TUBE SIMPLE WO FLUORO  06/08/2016  . IR REPLACE G-TUBE SIMPLE WO FLUORO  10/24/2016  . IR REPLACE G-TUBE SIMPLE WO FLUORO  01/07/2017  . PEG PLACEMENT N/A 02/02/2015   Procedure: PERCUTANEOUS ENDOSCOPIC GASTROSTOMY (PEG) PLACEMENT;  Surgeon: Judeth Horn, MD;  Location: [facility];  Service: General;  Laterality: N/A;  . RADIOLOGY WITH ANESTHESIA N/A 01/13/2015   Procedure: RADIOLOGY WITH ANESTHESIA;  Surgeon: Consuella Lose, MD;  Location: [facility];  Service: Radiology;  Laterality: N/A;  . TRACHEOSTOMY      Social History   Socioeconomic History  . Marital status: Single    Spouse name: Not on file  . Number of children: Not on file  . Years of education: Not on file  . Highest education level: Not on file  Occupational History  . Not on file  Social Needs  . Financial resource strain: Not on file  . Food insecurity:    Worry: Not on file    Inability: Not on file  . Transportation needs:    Medical: Not on file    Non-medical: Not on file  Tobacco Use  . Smoking status: Former Research scientist (life sciences)  . Smokeless tobacco: Never Used  Substance and Sexual Activity  . Alcohol use: No  . Drug use: No  . Sexual activity: Not on file  Lifestyle  . Physical activity:    Days per week: Not on file    Minutes  per session: Not on file  . Stress: Not on file  Relationships  . Social connections:    Talks on phone: Not on file    Gets together: Not on file    Attends religious service: Not on file    Active member of club or organization: Not on file    Attends meetings of clubs or organizations: Not on file    Relationship status: Not on file  . Intimate partner violence:    Fear of current or ex partner: Not on file    Emotionally abused: Not on file    Physically abused: Not on file    Forced sexual activity: Not on file  Other Topics Concern  . Not on file  Social History Narrative  . Not on file   Family  History  Problem Relation Age of Onset  . Hypertension Other       VITAL SIGNS BP 115/80   Pulse 98   Temp (!) 97.4 F (36.3 C)   Resp 18   Ht 5' (1.524 m)   Wt 129 lb 4.8 oz (58.7 kg)   LMP  (LMP Unknown)   SpO2 96%   BMI 25.25 kg/m   Outpatient Encounter Medications as of 05/28/2017  Medication Sig  . acetaminophen (TYLENOL) 325 MG tablet Place 650 mg into feeding tube every 4 (four) hours as needed for mild pain or fever.   . bethanechol (URECHOLINE) 10 MG tablet Place 10 mg into feeding tube 3 (three) times daily.   Marland Kitchen enoxaparin (LOVENOX) 60 MG/0.6ML injection Inject 0.6 mLs (60 mg total) into the skin 2 (two) times daily.  Marland Kitchen gabapentin (NEURONTIN) 100 MG capsule Take 200 mg by mouth 2 (two) times daily. HOLD IF LETHARGIC  . levETIRAcetam (KEPPRA) 100 MG/ML solution Place 5 mLs (500 mg total) into feeding tube 2 (two) times daily.  . metoprolol tartrate (LOPRESSOR) 25 MG tablet Take 25 mg by mouth 2 (two) times daily. Hold id SBP < 100 apical pulse  . Multiple Vitamin (MULTIVITAMIN) tablet Take 1 tablet by mouth daily.  . Nutritional Supplements (FEEDING SUPPLEMENT, JEVITY 1.5 CAL,) LIQD Continue with 42 cc per hour and flush with 200 cc every 4 hours  . Probiotic Product (PROBIOTIC DAILY) CAPS Place 1 capsule into feeding tube 2 (two) times daily. FOR 82 DAYS FOR GINGIVITIS  . ranitidine (ZANTAC) 150 MG tablet Place 150 mg into feeding tube daily.   . rosuvastatin (CRESTOR) 20 MG tablet Place 20 mg into feeding tube at bedtime.   . traZODone (DESYREL) 50 MG tablet Place 50 mg into feeding tube at bedtime.  . Water For Irrigation, Sterile (FREE WATER) SOLN Place 200 mLs into feeding tube 4 (four) times daily.  . [DISCONTINUED] Multiple Vitamins-Minerals (DECUBI-VITE) CAPS Place 1 capsule into feeding tube daily.   . [DISCONTINUED] Nutritional Supplements (PROMOD PO) Take 60 mLs by mouth 3 (three) times daily.  . [DISCONTINUED] polyethylene glycol powder (GLYCOLAX/MIRALAX)  powder Place 17 g into feeding tube daily.    No facility-administered encounter medications on file as of 05/28/2017.      SIGNIFICANT DIAGNOSTIC EXAMS  PREVIOUS  06-14-16: diagnostic right mammogram: benign cyst  12-31-16: right lower extremity doppler: no thrombus identified; unfortunately obscured exam. Right superficial femoral vein cannot be identified due to patient positioning. The profunda femoral and popliteal were patient. Right posterior tibial was obscured.   02-22-17: ct of abdomen and pelvis Bladder wall thickening with hazy irregular perivesicular margins compatible with cystitis; recommend correlation with urinalysis.  Minimal sigmoid diverticulosis. RIGHT lower lobe infiltrate question pneumonia or aspiration. No definite acute intra-abdominal or intrapelvic abnormalities otherwise seen.  Aortic Atherosclerosis  05-06-17: chest x-ray: No acute cardiopulmonary abnormality seen.  05-06-17: ct angio of chest: 1. No pulmonary embolus identified. 2. Diffuse peribronchial thickening with debris in the mainstem bronchi and extending into the lower lobes. Findings may represent acute bronchitis or aspiration given the distribution. No consolidation. 3. Mildly patulous esophagus. 4. Aorta and coronary artery calcific atherosclerosis.  05-07-17: kub: 1. Dilated loops of small bowel with very little colonic bowel gas. Possible ileus but cannot exclude partial small bowel obstruction. CT the abdomen pelvis may be helpful if warranted. 2. Gastrostomy tube is present. 3. Osteopenia.   05-07-17: ct of abdomen and pelvis:  1. Focally dilated segment of small bowel within the right abdomen with abrupt transition to decompressed distal small bowel in the central upper pelvis with decompressed distal small bowel and colon; overall pattern favors a mechanical small bowel obstruction. Given the focal appearance and right-sided distribution of the dilated small bowel, could consider closed loop  obstruction although no definite mesenteric swirling or mesenteric vascular crowding is identified. Negative for intramural air or free air. 2. Development of trace right pleural effusion with consolidation at the right base which may reflect atelectasis or pneumonia 3. Patchy increased cortical density in the right kidney, suspect for delayed nephrogram as may be seen with compromised renal function. 4. Layering stones or sludge within the gallbladder.   05-08-17: 2-d echo: - Left ventricle: The cavity size was normal. There was mild concentric hypertrophy. Systolic function was vigorous. The estimated ejection fraction was in the range of 65% to 70%. Wall motion was normal; there were no regional wall motion abnormalities. Doppler parameters are consistent with abnormal left ventricular relaxation (grade 1 diastolic dysfunction). There was no evidence of elevated ventricular filling pressure by  Doppler parameters. - Aortic valve: There was no regurgitation. - Aortic root: The aortic root was normal in size. - Mitral valve: There was no regurgitation. - Left atrium: The atrium was normal in size. - Right ventricle: Systolic function was normal. - Right atrium: The atrium was normal in size. - Pulmonic valve: There was no regurgitation. - Inferior vena cava: The vessel was normal in size. - Pericardium, extracardiac: A mild anteriorly located pericardial effusion was identified. Features were not consistent with  tamponade physiology.  05-09-17: right foot x-ray: 1. No radiographic findings of osteomyelitis. 2. Osteoporosis.  05-12-17: MRI right knee: 1. Incomplete study as only three axial sequences were obtained. The patient refused further imaging due to pain. 2. No joint effusion, excluding septic arthritis. 3. 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 No evidence of osteomyelitis.  05-13-17: MRI right ankle: 1. No osteomyelitis of right ankle. 2. No MRI evidence of septic arthritis of the  right ankle. 3. Soft tissue ulcer overlying the posterior calcaneus.  NO NEW EXAMS      LABS REVIEWED: PREVIOUS   06-14-16: wbc 8.4; hgb 11.9; hct 36.6; mcv 95.4; plt 203; glucose 134; bun 30.7; creat 0.62; k+ 4.5 ;na++ 149; liver normal albumin 4.2; vit B 12: 521; folate 19.1; chol 155; ldl 85; trig 181; hdl 34; hgb a1c 5.7  8-18: hgb a1c 5.4 09-24-16: urine micro-albumin <1.2 10-11-16: glucose 133; bun 25.6; creat 0.63; k+ 4.8; na++ 140; ca 9.9; liver normal albumin 4.3 10-22-16: glucose 131; bun 29.0; creat 0.62; k+ 4.7; na++ 141; ca 9.8; liver normal albumin 4.3 01-06-17: wb 8.1; hgb 15.0;  hct 45.0; mcv  93.2; plt 199; glucose 99; bun 20; creat 0.74; k+ 4.0; na++ 142; ca 10.7  01-25-17: hgb a1c 6.1; chol 187; ldl 117; trig 184; hdl 31  02-14-17: wbc 28.0; hgb 13.9; hct 41.0; mcv 90.5 ;plt 297  glucose 165; bun 34.4; creat 1.04; k+ 4.1; NA++ 156; ALT 38 AST 22; alk phos  133; albumin 4.2 blood culture: no growth urine culture no growth 02-14-17 (ED):  Wbc 33.7; hgb 13.4; hct 42.8; mcv 99.5; plt 279 glucose 210; bun 41; creat 1.37; k+ 3.6; na++ 155; ca 9.9; liver normal albumin 3.4  02-15-17: blood culture: no growth; flu: neg HIV: nr; hgb a1c 6.1 02-17-17: wbc 18.4; hgb 9.4; hct 31.6; mcv 97.8; plt 214; glucose 100; bun 23; creat 0.75; k+ 3.2; na++150 liver normal albumin 2.4 mag 2.3 phos 3.0  02-22-17: wbc 13.3; hgb 10.5; hct 33.6; mcv 94.6; plt 424; glucose 102; bun 16; creat 0.58; k+ 4.0; na++ 138; ca 9.9 02-24-17: wbc  11.2; hgb 11.2; hct 35.2; mcv 95.7; plt 387; glucose 115; bun 11; creat 0.57; k+ 4.2; na++ 138; ca 9.4 05-06-17: wbc 16.7; hgb 14.5; hct  47.9; mcv 96.8; plt 306; glucose 251; bun 68; creat 2.68; k+ 3.5; na++ 154; ca 9.1; blood culture: MRSA: urine culture: no growth 05-07-17: glucose 109; bun 60; creat 2.10; k+ 4.7; na++ 158; ca 7.4 ;liver normal albumin 2.3 05-09-17: wbc 10.0; hgb 11.8; hct 37.7; mcv 92.9; plt 210; glucose 121; bun 7; creat 0.77; k+ 3.0; na++ 142; ca 9.0; blood  culture: no growth 05-10-17: wbc 8.5; hgb 11.0; hct 35.1; mcv 92.4; plt 214; glucose 120; bun 6; creat 0.87; k+ 3.2; na++ 143; ca 9.2; hepatitis: neg 05-12-17: wbc 8.9; hgb 11.6; hct 36.0; mcv 91.4; plt 231; glucose 113; bun 8; creat 0.70; k+ 3.6; na++ 141; ca 9.3  NO NEW LABS     Review of Systems  Unable to perform ROS: Other (nonverbal )    Physical Exam  Constitutional: She appears well-developed and well-nourished. No distress.  Neck: Carotid bruit is present. No thyromegaly present.  Cardiovascular: Normal rate, regular rhythm, normal heart sounds and intact distal pulses.  Pulmonary/Chest: Effort normal and breath sounds normal. No respiratory distress.  Abdominal: Soft. Bowel sounds are normal. She exhibits no distension. There is no tenderness.  Peg tube without signs of infection present   Musculoskeletal: She exhibits no edema.  Left hemiplegia   Lymphadenopathy:    She has no cervical adenopathy.  Neurological: She is alert.  Skin: Skin is warm and dry. She is not diaphoretic.  Psychiatric: She has a normal mood and affect.      ASSESSMENT/ PLAN:  TODAY  1. Essential benign hypertension with supraventricular tachycardia:  stable: b/p 115/80  pulse: 98: will continue lopressor 25 mg twice daily   2.  Dysphagia paraesophageal  phase : stable  no signs of aspiration present  her po intake is not adequate to maintain her body weight; she is dependent upon peg tube feeding.  3. 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    4. Dyslipidemia associated with type 2 diabetes mellitus: stable  continue crestor 20 mg daily will monitor   PREVIOUS   5. gerd without esophagitis: stable will continue zantac 150 mg daily   6. Bruxism: is without change; is awaiting dental appointment   7. Slow transit constipation: is status post  SBO:  will continue miralax 17 gm daily   8. Insomnia: is stable will continue trazodone 50 mg nightly   9. Urine retention: stable will continue urecholine 10 mg three times daily  ; will not make changes will not make changes.   10. Type 2 diabetes mellitus with neurological manifestations: is stable  hgb a1c is 5.4 (previous 5.7) ; is currently off medications; will continue to monitor her status.  ldl is 85 is on statin.   11. Seizures: is stable  no reports of seizure activity present: will continue keppra 500 mg twice daily and will monitor   12. 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   MD is aware of resident's narcotic use and is in agreement with current plan of care. We will attempt to wean resident as apropriate   Ok Edwards NP [facility]  Contact [phone] Monday through Friday 8am- 5pm  After hours call [phone]

## 2017-05-29 LAB — LIPID PANEL
CHOLESTEROL: 80 (ref 0–200)
HDL: 27 — AB (ref 35–70)
LDL Cholesterol: 31
TRIGLYCERIDES: 108 (ref 40–160)

## 2017-05-29 LAB — HEMOGLOBIN A1C: Hemoglobin A1C: 6.1

## 2017-06-03 LAB — LIPID PANEL
Cholesterol: 112 (ref 0–200)
HDL: 29 — AB (ref 35–70)
LDL CALC: 58
Triglycerides: 127 (ref 40–160)

## 2017-06-03 LAB — CBC AND DIFFERENTIAL
HEMATOCRIT: 38 (ref 36–46)
Hemoglobin: 12.1 (ref 12.0–16.0)
NEUTROS ABS: 6
PLATELETS: 209 (ref 150–399)
WBC: 8.8

## 2017-06-03 LAB — BASIC METABOLIC PANEL
BUN: 18 (ref 4–21)
Creatinine: 0.6 (ref 0.5–1.1)
GLUCOSE: 119
Potassium: 5.2 (ref 3.4–5.3)
Sodium: 147 (ref 137–147)

## 2017-06-04 ENCOUNTER — Encounter: Payer: Self-pay | Admitting: Adult Health

## 2017-06-04 ENCOUNTER — Non-Acute Institutional Stay: Payer: Self-pay | Admitting: Internal Medicine

## 2017-06-04 ENCOUNTER — Non-Acute Institutional Stay (SKILLED_NURSING_FACILITY): Payer: Medicaid Other | Admitting: Adult Health

## 2017-06-04 DIAGNOSIS — K219 Gastro-esophageal reflux disease without esophagitis: Secondary | ICD-10-CM | POA: Diagnosis not present

## 2017-06-04 DIAGNOSIS — R1314 Dysphagia, pharyngoesophageal phase: Secondary | ICD-10-CM

## 2017-06-04 DIAGNOSIS — R531 Weakness: Secondary | ICD-10-CM

## 2017-06-04 DIAGNOSIS — F5101 Primary insomnia: Secondary | ICD-10-CM

## 2017-06-04 DIAGNOSIS — F458 Other somatoform disorders: Secondary | ICD-10-CM | POA: Diagnosis not present

## 2017-06-04 DIAGNOSIS — K5901 Slow transit constipation: Secondary | ICD-10-CM | POA: Diagnosis not present

## 2017-06-04 NOTE — Progress Notes (Signed)
Location:   [facility] Room Number: 208 A Place of Service:  SNF (31)   CODE STATUS: Full Code (Most form updated 08/21/16)  No Known Allergies  Chief Complaint  Patient presents with  . Medical Management of Chronic Issues    Gerd; bruxism; constipation; insomnia. Weekly follow up for the first 30 days post hospitalization     HPI:  She is a 52 year old long term resident of this facility being seen for the management of her chronic illnesses; gerd; bruxism; constipation; insomnia. She is unable to participate in the hpi or ros. There are no reports of constipation present; no insomnia present no signs of aspiration present.  There are no nursing concerns at this time.   Past Medical History:  Diagnosis Date  . Acute pulmonary embolism ([place]) 02/19/2015  . Acute respiratory failure ([place])   . Diabetes mellitus without complication (HCC)    Type 2, W/o complications  . DVT (deep venous thrombosis) ([place]) 04/05/2015  . Dysphagia   . Epilepsy ([place])   . GERD (gastroesophageal reflux disease)   . Hyperlipidemia   . Hypertension   . IBS (irritable bowel syndrome)   . Nontraumatic subarachnoid hemorrhage ([place])   . SAH (subarachnoid hemorrhage) ([place])   . Urinary retention     Past Surgical History:  Procedure Laterality Date  . ABDOMINAL SURGERY    . ANEURYSM COILING    . COLONOSCOPY N/A 02/20/2015   Procedure: COLONOSCOPY;  Surgeon: Gatha Mayer, MD;  Location: [facility];  Service: Endoscopy;  Laterality: N/A;  . ESOPHAGOGASTRODUODENOSCOPY (EGD) WITH PROPOFOL N/A 02/02/2015   Procedure: ESOPHAGOGASTRODUODENOSCOPY (EGD) WITH PROPOFOL;  Surgeon: Judeth Horn, MD;  Location: [facility];  Service: General;  Laterality: N/A;  . IR GENERIC HISTORICAL  08/26/2015   IR GASTRIC TUBE PERC CHG W/O IMG GUIDE 08/26/2015 Aletta Edouard, MD [facility]  . IR GENERIC HISTORICAL  09/14/2015   IR REPLC GASTRO/COLONIC TUBE PERCUT W/FLUORO 09/14/2015 Darrell K Allred, PA-C  [facility]  . IR REPLACE G-TUBE SIMPLE WO FLUORO  06/08/2016  . IR REPLACE G-TUBE SIMPLE WO FLUORO  10/24/2016  . IR REPLACE G-TUBE SIMPLE WO FLUORO  01/07/2017  . PEG PLACEMENT N/A 02/02/2015   Procedure: PERCUTANEOUS ENDOSCOPIC GASTROSTOMY (PEG) PLACEMENT;  Surgeon: Judeth Horn, MD;  Location: [facility];  Service: General;  Laterality: N/A;  . RADIOLOGY WITH ANESTHESIA N/A 01/13/2015   Procedure: RADIOLOGY WITH ANESTHESIA;  Surgeon: Consuella Lose, MD;  Location: [facility];  Service: Radiology;  Laterality: N/A;  . TRACHEOSTOMY      Social History   Socioeconomic History  . Marital status: Single    Spouse name: Not on file  . Number of children: Not on file  . Years of education: Not on file  . Highest education level: Not on file  Occupational History  . Not on file  Social Needs  . Financial resource strain: Not on file  . Food insecurity:    Worry: Not on file    Inability: Not on file  . Transportation needs:    Medical: Not on file    Non-medical: Not on file  Tobacco Use  . Smoking status: Former Research scientist (life sciences)  . Smokeless tobacco: Never Used  Substance and Sexual Activity  . Alcohol use: No  . Drug use: No  . Sexual activity: Not on file  Lifestyle  . Physical activity:    Days per week: Not on file    Minutes per session: Not on file  .  Stress: Not on file  Relationships  . Social connections:    Talks on phone: Not on file    Gets together: Not on file    Attends religious service: Not on file    Active member of club or organization: Not on file    Attends meetings of clubs or organizations: Not on file    Relationship status: Not on file  . Intimate partner violence:    Fear of current or ex partner: Not on file    Emotionally abused: Not on file    Physically abused: Not on file    Forced sexual activity: Not on file  Other Topics Concern  . Not on file  Social History Narrative  . Not on file   Family History  Problem Relation Age of Onset  .  Hypertension Other       VITAL SIGNS BP 128/66   Pulse 88   Temp (!) 97.4 F (36.3 C)   Resp 18   Ht 5' (1.524 m)   Wt 129 lb 4.8 oz (58.7 kg)   LMP  (LMP Unknown)   SpO2 96%   BMI 25.25 kg/m   Outpatient Encounter Medications as of 06/04/2017  Medication Sig  . acetaminophen (TYLENOL) 325 MG tablet Place 650 mg into feeding tube every 4 (four) hours as needed for mild pain or fever.   . bethanechol (URECHOLINE) 10 MG tablet Place 10 mg into feeding tube 3 (three) times daily.   Marland Kitchen gabapentin (NEURONTIN) 100 MG capsule Take 200 mg by mouth 2 (two) times daily. HOLD IF LETHARGIC  . levETIRAcetam (KEPPRA) 100 MG/ML solution Place 5 mLs (500 mg total) into feeding tube 2 (two) times daily.  . metoprolol tartrate (LOPRESSOR) 25 MG tablet Take 25 mg by mouth 2 (two) times daily. Hold id SBP < 100 apical pulse  . Multiple Vitamin (MULTIVITAMIN) tablet Take 1 tablet by mouth daily.  . Nutritional Supplements (FEEDING SUPPLEMENT, JEVITY 1.5 CAL,) LIQD Continue with 42 cc per hour and flush with 200 cc every 4 hours  . polyethylene glycol (MIRALAX / GLYCOLAX) packet Take 17 g by mouth daily.  . Probiotic Product (PROBIOTIC DAILY) CAPS Place 1 capsule into feeding tube 2 (two) times daily. FOR 82 DAYS FOR GINGIVITIS  . ranitidine (ZANTAC) 150 MG tablet Place 150 mg into feeding tube daily.   . rosuvastatin (CRESTOR) 20 MG tablet Place 20 mg into feeding tube at bedtime.   . traZODone (DESYREL) 50 MG tablet Place 50 mg into feeding tube at bedtime.  . Water For Irrigation, Sterile (FREE WATER) SOLN Place 200 mLs into feeding tube 4 (four) times daily.  Marland Kitchen enoxaparin (LOVENOX) 60 MG/0.6ML injection Inject 0.6 mLs (60 mg total) into the skin 2 (two) times daily.   No facility-administered encounter medications on file as of 06/04/2017.      SIGNIFICANT DIAGNOSTIC EXAMS  PREVIOUS  06-14-16: diagnostic right mammogram: benign cyst  12-31-16: right lower extremity doppler: no thrombus  identified; unfortunately obscured exam. Right superficial femoral vein cannot be identified due to patient positioning. The profunda femoral and popliteal were patient. Right posterior tibial was obscured.   02-22-17: ct of abdomen and pelvis Bladder wall thickening with hazy irregular perivesicular margins compatible with cystitis; recommend correlation with urinalysis. Minimal sigmoid diverticulosis. RIGHT lower lobe infiltrate question pneumonia or aspiration. No definite acute intra-abdominal or intrapelvic abnormalities otherwise seen.  Aortic Atherosclerosis  05-06-17: chest x-ray: No acute cardiopulmonary abnormality seen.  05-06-17: ct angio of chest: 1. No  pulmonary embolus identified. 2. Diffuse peribronchial thickening with debris in the mainstem bronchi and extending into the lower lobes. Findings may represent acute bronchitis or aspiration given the distribution. No consolidation. 3. Mildly patulous esophagus. 4. Aorta and coronary artery calcific atherosclerosis.  05-07-17: kub: 1. Dilated loops of small bowel with very little colonic bowel gas. Possible ileus but cannot exclude partial small bowel obstruction. CT the abdomen pelvis may be helpful if warranted. 2. Gastrostomy tube is present. 3. Osteopenia.   05-07-17: ct of abdomen and pelvis:  1. Focally dilated segment of small bowel within the right abdomen with abrupt transition to decompressed distal small bowel in the central upper pelvis with decompressed distal small bowel and colon; overall pattern favors a mechanical small bowel obstruction. Given the focal appearance and right-sided distribution of the dilated small bowel, could consider closed loop obstruction although no definite mesenteric swirling or mesenteric vascular crowding is identified. Negative for intramural air or free air. 2. Development of trace right pleural effusion with consolidation at the right base which may reflect atelectasis or pneumonia 3. Patchy  increased cortical density in the right kidney, suspect for delayed nephrogram as may be seen with compromised renal function. 4. Layering stones or sludge within the gallbladder.   05-08-17: 2-d echo: - Left ventricle: The cavity size was normal. There was mild concentric hypertrophy. Systolic function was vigorous. The estimated ejection fraction was in the range of 65% to 70%. Wall motion was normal; there were no regional wall motion abnormalities. Doppler parameters are consistent with abnormal left ventricular relaxation (grade 1 diastolic dysfunction). There was no evidence of elevated ventricular filling pressure by  Doppler parameters. - Aortic valve: There was no regurgitation. - Aortic root: The aortic root was normal in size. - Mitral valve: There was no regurgitation. - Left atrium: The atrium was normal in size. - Right ventricle: Systolic function was normal. - Right atrium: The atrium was normal in size. - Pulmonic valve: There was no regurgitation. - Inferior vena cava: The vessel was normal in size. - Pericardium, extracardiac: A mild anteriorly located pericardial effusion was identified. Features were not consistent with  tamponade physiology.  05-09-17: right foot x-ray: 1. No radiographic findings of osteomyelitis. 2. Osteoporosis.  05-12-17: MRI right knee: 1. Incomplete study as only three axial sequences were obtained. The patient refused further imaging due to pain. 2. No joint effusion, excluding septic arthritis. 3. 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 No evidence of osteomyelitis.  05-13-17: MRI right ankle: 1. No osteomyelitis of right ankle. 2. No MRI evidence of septic arthritis of the right ankle. 3. Soft tissue ulcer overlying the posterior calcaneus.  NO NEW EXAMS      LABS REVIEWED: PREVIOUS   06-14-16: wbc 8.4; hgb 11.9; hct 36.6; mcv 95.4; plt 203; glucose 134; bun 30.7; creat 0.62; k+ 4.5 ;na++ 149; liver normal albumin 4.2; vit B 12: 521; folate  19.1; chol 155; ldl 85; trig 181; hdl 34; hgb a1c 5.7  8-18: hgb a1c 5.4 09-24-16: urine micro-albumin <1.2 10-11-16: glucose 133; bun 25.6; creat 0.63; k+ 4.8; na++ 140; ca 9.9; liver normal albumin 4.3 10-22-16: glucose 131; bun 29.0; creat 0.62; k+ 4.7; na++ 141; ca 9.8; liver normal albumin 4.3 01-06-17: wb 8.1; hgb 15.0; hct 45.0; mcv  93.2; plt 199; glucose 99; bun 20; creat 0.74; k+ 4.0; na++ 142; ca 10.7  01-25-17: hgb a1c 6.1; chol 187; ldl 117; trig 184; hdl 31  02-14-17: wbc 28.0; hgb 13.9; hct 41.0;  mcv 90.5 ;plt 297  glucose 165; bun 34.4; creat 1.04; k+ 4.1; NA++ 156; ALT 38 AST 22; alk phos  133; albumin 4.2 blood culture: no growth urine culture no growth 02-14-17 (ED):  Wbc 33.7; hgb 13.4; hct 42.8; mcv 99.5; plt 279 glucose 210; bun 41; creat 1.37; k+ 3.6; na++ 155; ca 9.9; liver normal albumin 3.4  02-15-17: blood culture: no growth; flu: neg HIV: nr; hgb a1c 6.1 02-17-17: wbc 18.4; hgb 9.4; hct 31.6; mcv 97.8; plt 214; glucose 100; bun 23; creat 0.75; k+ 3.2; na++150 liver normal albumin 2.4 mag 2.3 phos 3.0  02-22-17: wbc 13.3; hgb 10.5; hct 33.6; mcv 94.6; plt 424; glucose 102; bun 16; creat 0.58; k+ 4.0; na++ 138; ca 9.9 02-24-17: wbc  11.2; hgb 11.2; hct 35.2; mcv 95.7; plt 387; glucose 115; bun 11; creat 0.57; k+ 4.2; na++ 138; ca 9.4 05-06-17: wbc 16.7; hgb 14.5; hct  47.9; mcv 96.8; plt 306; glucose 251; bun 68; creat 2.68; k+ 3.5; na++ 154; ca 9.1; blood culture: MRSA: urine culture: no growth 05-07-17: glucose 109; bun 60; creat 2.10; k+ 4.7; na++ 158; ca 7.4 ;liver normal albumin 2.3 05-09-17: wbc 10.0; hgb 11.8; hct 37.7; mcv 92.9; plt 210; glucose 121; bun 7; creat 0.77; k+ 3.0; na++ 142; ca 9.0; blood culture: no growth 05-10-17: wbc 8.5; hgb 11.0; hct 35.1; mcv 92.4; plt 214; glucose 120; bun 6; creat 0.87; k+ 3.2; na++ 143; ca 9.2; hepatitis: neg 05-12-17: wbc 8.9; hgb 11.6; hct 36.0; mcv 91.4; plt 231; glucose 113; bun 8; creat 0.70; k+ 3.6; na++ 141; ca 9.3  TODAY:   05-29-17:  chol 80; ldl 31; trig 108; hdl 21; hgb a1c 6.1      Review of Systems  Unable to perform ROS: Other (nonverbal )    Physical Exam  Constitutional: She appears well-developed and well-nourished. No distress.  Neck: Carotid bruit is present. No thyromegaly present.  Cardiovascular: Normal rate, regular rhythm and normal heart sounds.  Pulmonary/Chest: Effort normal and breath sounds normal. No respiratory distress.  Abdominal: Soft. Bowel sounds are normal. She exhibits no distension. There is no tenderness.  Peg tube present without signs of infection present   Musculoskeletal: She exhibits no edema.  Left hemiparesis   Lymphadenopathy:    She has no cervical adenopathy.  Neurological: She is alert.  Skin: Skin is warm and dry. She is not diaphoretic.  Psychiatric: She has a normal mood and affect.    ASSESSMENT/ PLAN:  TODAY  1. gerd without esophagitis: stable will continue zantac 150 mg daily   2. Bruxism: is without change; is awaiting dental appointment   3. Slow transit constipation: is status post SBO: will continue miralax 17 gm daily   4. Insomnia: is stable will continue trazodone 50 mg nightly   PREVIOUS   5. Urine retention: stable will continue urecholine 10 mg three times daily  ; will not make changes will not make changes.   6. Type 2 diabetes mellitus with neurological manifestations: is stable  hgb a1c is 6.1 (previous 5.4) ; is currently off medications; will continue to monitor her status.  ldl is 85 is on statin.   7. Seizures: is stable  no reports of seizure activity present: will continue keppra 500 mg twice daily and will monitor   8. DVT (chronic left lower extremity) and chronic pulmonary embolism: is stable  she does require long term anticoagulation therapy; will continue lovenox 60  mg twice daily (weigt based); is not  a candidate for xarelto; or eliquis due to her history of GI bleed; she is not appropriate for coumadin therapy; unable  to  obtain adequate INR.  9. Essential benign hypertension with supraventricular tachycardia:  stable: b/p 128/66  pulse: 88: will continue lopressor 25 mg twice daily   10.  Dysphagia paraesophageal  phase : stable  no signs of aspiration present she is npo; she is dependent upon peg tube feeding.  11. 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    12. Dyslipidemia associated with type 2 diabetes mellitus: stable ldl 31  continue crestor 20 mg daily will monitor        MD is aware of resident's narcotic use and is in agreement with current plan of care. We will attempt to wean resident as apropriate   Ok Edwards NP [facility]  Contact [phone] Monday through Friday 8am- 5pm  After hours call [phone]

## 2017-06-06 NOTE — Progress Notes (Signed)
PALLIATIVE CARE CONSULT VISIT   PATIENT NAME: Rachel Vang DOB: 28-Mar-1965 MRN: [id number]  PRIMARY CARE PROVIDER:   Kirt Boys, DO  REFERRING PROVIDER:      Kirt Boys, DO [address] [place], [place] [address]  RESPONSIBLE PARTYCyndi [id number] [phone], [phone]   RECOMMENDATIONS and PLAN: 1. Generalized weakness R53.1:  At baseline post CVA. Remains mobile via wheelchair.. Supportive care and activities as tolerated.  2. Dysphagia R13.14:  Remains dependent on tube feedings.  No oral intake.  Monitor weights monthly which have been relatively stable..  3. Advanced care planning:  Full code.  She will need continued total healthcare assistance. Plan on discussion with family in the near future to assess goals of care.  I spent 15 minutes providing this consultation at [facility],  from 3:35PM to 3:50PM. More than 50% of the time in this consultation was spent coordinating communication pt.,NP and facility staff.Marland Kitchen   HISTORY OF PRESENT ILLNESS: Follow-up with Rachel Vang.  Her last hospitalization was in Jan. 2019 due to sepsis.  She is much less verbal and is solely  dependent upon tube feedings via PEG tube for nutritional intake.  She requires assistance with all ADLs.  CODE STATUS: FULL CODE  PPS: 10%  Tube feedings only HOSPICE ELIGIBILITY/DIAGNOSIS: TBD  PAST MEDICAL HISTORY:  Past Medical History:  Diagnosis Date  . Acute pulmonary embolism (HCC) 02/19/2015  . Acute respiratory failure (HCC)   . Diabetes mellitus without complication (HCC)    Type 2, W/o complications  . DVT (deep venous thrombosis) ([facility]) 04/05/2015  . Dysphagia   . Epilepsy ([facility])   . GERD (gastroesophageal reflux disease)   . Hyperlipidemia   . Hypertension   . IBS (irritable bowel syndrome)   . Nontraumatic subarachnoid hemorrhage (HCC)   . SAH (subarachnoid hemorrhage) (H[facility])   . Urinary retention     SOCIAL HX:  Social History   Tobacco Use  .  Smoking status: Former Games developer  . Smokeless tobacco: Never Used  Substance Use Topics  . Alcohol use: No    ALLERGIES: No Known Allergies   PERTINENT MEDICATIONS:  Outpatient Encounter Medications as of 06/04/2017  Medication Sig  . acetaminophen (TYLENOL) 325 MG tablet Place 650 mg into feeding tube every 4 (four) hours as needed for mild pain or fever.   . bethanechol (URECHOLINE) 10 MG tablet Place 10 mg into feeding tube 3 (three) times daily.   Marland Kitchen gabapentin (NEURONTIN) 100 MG capsule Take 200 mg by mouth 2 (two) times daily. HOLD IF LETHARGIC  . levETIRAcetam (KEPPRA) 100 MG/ML solution Place 5 mLs (500 mg total) into feeding tube 2 (two) times daily.  . metoprolol tartrate (LOPRESSOR) 25 MG tablet Take 25 mg by mouth 2 (two) times daily. Hold id SBP < 100 apical pulse  . Multiple Vitamin (MULTIVITAMIN) tablet Take 1 tablet by mouth daily.  . Nutritional Supplements (FEEDING SUPPLEMENT, JEVITY 1.5 CAL,) LIQD Continue with 42 cc per hour and flush with 200 cc every 4 hours  . polyethylene glycol (MIRALAX / GLYCOLAX) packet Take 17 g by mouth daily.  . Probiotic Product (PROBIOTIC DAILY) CAPS Place 1 capsule into feeding tube 2 (two) times daily. FOR 82 DAYS FOR GINGIVITIS  . ranitidine (ZANTAC) 150 MG tablet Place 150 mg into feeding tube daily.   . rosuvastatin (CRESTOR) 20 MG tablet Place 20 mg into feeding tube at bedtime.   . traZODone (DESYREL) 50 MG tablet Place 50 mg into feeding tube  at bedtime.  . Water For Irrigation, Sterile (FREE WATER) SOLN Place 200 mLs into feeding tube 4 (four) times daily.   No facility-administered encounter medications on file as of 06/04/2017.     PHYSICAL EXAM:   General: NAD, frail appearing,   Reclining in bed Cardiovascular: regular rate and rhythm Pulmonary: clear ant and posterior fields Abdomen: soft, nontender, + bowel sounds.  PEG tube in place with infusion of tube feedings Extremities: no edema, no joint deformities Skin: Exposed skin  is intact Neurological: Nods yes and no appropriately.  Spoke one word when asked to.  Weakness   Margaretha Sheffield, NP-C

## 2017-06-07 DIAGNOSIS — R531 Weakness: Secondary | ICD-10-CM | POA: Insufficient documentation

## 2017-06-08 DIAGNOSIS — K5901 Slow transit constipation: Secondary | ICD-10-CM | POA: Insufficient documentation

## 2017-06-08 DIAGNOSIS — G4700 Insomnia, unspecified: Secondary | ICD-10-CM | POA: Insufficient documentation

## 2017-06-12 ENCOUNTER — Non-Acute Institutional Stay (SKILLED_NURSING_FACILITY): Payer: Medicaid Other | Admitting: Adult Health

## 2017-06-12 ENCOUNTER — Encounter: Payer: Self-pay | Admitting: Adult Health

## 2017-06-12 DIAGNOSIS — E1149 Type 2 diabetes mellitus with other diabetic neurological complication: Secondary | ICD-10-CM | POA: Diagnosis not present

## 2017-06-12 DIAGNOSIS — R339 Retention of urine, unspecified: Secondary | ICD-10-CM | POA: Diagnosis not present

## 2017-06-12 DIAGNOSIS — R569 Unspecified convulsions: Secondary | ICD-10-CM

## 2017-06-12 DIAGNOSIS — I825Y2 Chronic embolism and thrombosis of unspecified deep veins of left proximal lower extremity: Secondary | ICD-10-CM

## 2017-06-12 NOTE — Progress Notes (Signed)
Location:   [facility] Room Number: 208 A Place of Service:  SNF (31)   CODE STATUS: Full Code (Most form updated 08/21/16)  No Known Allergies  Chief Complaint  Patient presents with  . Medical Management of Chronic Issues    Dvt; diabetes; urine retention; seizures.     HPI:  She is a 52 year [place] long term resident of this facility being seen for the management of her chronic illnesses; dvt; diabetes; urine retention; seizures. She is unable to participate in the hpi or ros. There are no reports of aspiration present; no recent seizures; no peripheral edema. There are no nursing concerns at this time.   Past Medical History:  Diagnosis Date  . Acute pulmonary embolism ([place]) 02/19/2015  . Acute respiratory failure ([place])   . Diabetes mellitus without complication (HCC)    Type 2, W/o complications  . DVT (deep venous thrombosis) ([place]) 04/05/2015  . Dysphagia   . Epilepsy ([place])   . GERD (gastroesophageal reflux disease)   . Hyperlipidemia   . Hypertension   . IBS (irritable bowel syndrome)   . Nontraumatic subarachnoid hemorrhage ([place])   . SAH (subarachnoid hemorrhage) ([place])   . Urinary retention     Past Surgical History:  Procedure Laterality Date  . ABDOMINAL SURGERY    . ANEURYSM COILING    . COLONOSCOPY N/A 02/20/2015   Procedure: COLONOSCOPY;  Surgeon: Gatha Mayer, MD;  Location: [facility];  Service: Endoscopy;  Laterality: N/A;  . ESOPHAGOGASTRODUODENOSCOPY (EGD) WITH PROPOFOL N/A 02/02/2015   Procedure: ESOPHAGOGASTRODUODENOSCOPY (EGD) WITH PROPOFOL;  Surgeon: Judeth Horn, MD;  Location: [facility];  Service: General;  Laterality: N/A;  . IR GENERIC HISTORICAL  08/26/2015   IR GASTRIC TUBE PERC CHG W/O IMG GUIDE 08/26/2015 Aletta Edouard, MD [facility]  . IR GENERIC HISTORICAL  09/14/2015   IR REPLC GASTRO/COLONIC TUBE PERCUT W/FLUORO 09/14/2015 Darrell K Allred, PA-C [facility]  . IR REPLACE G-TUBE SIMPLE WO FLUORO  06/08/2016  . IR  REPLACE G-TUBE SIMPLE WO FLUORO  10/24/2016  . IR REPLACE G-TUBE SIMPLE WO FLUORO  01/07/2017  . PEG PLACEMENT N/A 02/02/2015   Procedure: PERCUTANEOUS ENDOSCOPIC GASTROSTOMY (PEG) PLACEMENT;  Surgeon: Judeth Horn, MD;  Location: [place] [facility] [place];  Service: General;  Laterality: N/A;  . RADIOLOGY WITH ANESTHESIA N/A 01/13/2015   Procedure: RADIOLOGY WITH ANESTHESIA;  Surgeon: Consuella Lose, MD;  Location: [facility];  Service: Radiology;  Laterality: N/A;  . TRACHEOSTOMY      Social History   Socioeconomic History  . Marital status: Single    Spouse name: Not on file  . Number of children: Not on file  . Years of education: Not on file  . Highest education level: Not on file  Occupational History  . Not on file  Social Needs  . Financial resource strain: Not on file  . Food insecurity:    Worry: Not on file    Inability: Not on file  . Transportation needs:    Medical: Not on file    Non-medical: Not on file  Tobacco Use  . Smoking status: Former Research scientist (life sciences)  . Smokeless tobacco: Never Used  Substance and Sexual Activity  . Alcohol use: No  . Drug use: No  . Sexual activity: Not on file  Lifestyle  . Physical activity:    Days per week: Not on file    Minutes per session: Not on file  . Stress: Not on file  Relationships  . Social connections:  Talks on phone: Not on file    Gets together: Not on file    Attends religious service: Not on file    Active member of club or organization: Not on file    Attends meetings of clubs or organizations: Not on file    Relationship status: Not on file  . Intimate partner violence:    Fear of current or ex partner: Not on file    Emotionally abused: Not on file    Physically abused: Not on file    Forced sexual activity: Not on file  Other Topics Concern  . Not on file  Social History Narrative  . Not on file   Family History  Problem Relation Age of Onset  . Hypertension Other       VITAL SIGNS BP 124/70   Pulse 74   Temp  (!) 97.3 F (36.3 C)   Resp 18   Ht 5' (1.524 m)   Wt 129 lb 4.8 oz (58.7 kg)   LMP  (LMP Unknown)   SpO2 94%   BMI 25.25 kg/m   Outpatient Encounter Medications as of 06/12/2017  Medication Sig  . acetaminophen (TYLENOL) 325 MG tablet Place 650 mg into feeding tube every 4 (four) hours as needed for mild pain or fever.   . bethanechol (URECHOLINE) 10 MG tablet Place 10 mg into feeding tube 3 (three) times daily.   [place] Kitchen enoxaparin (LOVENOX) 60 MG/0.6ML injection Inject 0.6 mLs (60 mg total) into the skin 2 (two) times daily.  [place] Kitchen gabapentin (NEURONTIN) 100 MG capsule Take 200 mg by mouth 2 (two) times daily. HOLD IF LETHARGIC  . levETIRAcetam (KEPPRA) 100 MG/ML solution Place 5 mLs (500 mg total) into feeding tube 2 (two) times daily.  . metoprolol tartrate (LOPRESSOR) 25 MG tablet Take 25 mg by mouth 2 (two) times daily. Hold id SBP < 100 apical pulse  . Multiple Vitamin (MULTIVITAMIN) tablet Take 1 tablet by mouth daily.  . Nutritional Supplements (FEEDING SUPPLEMENT, JEVITY 1.5 CAL,) LIQD Continue with 42 cc per hour and flush with 200 cc every 4 hours  . Probiotic Product (PROBIOTIC DAILY) CAPS Place 1 capsule into feeding tube 2 (two) times daily. FOR 82 DAYS FOR GINGIVITIS  . ranitidine (ZANTAC) 150 MG tablet Place 150 mg into feeding tube daily.   . rosuvastatin (CRESTOR) 20 MG tablet Place 20 mg into feeding tube at bedtime.   . traZODone (DESYREL) 50 MG tablet Place 50 mg into feeding tube at bedtime.  . Water For Irrigation, Sterile (FREE WATER) SOLN Place 200 mLs into feeding tube 4 (four) times daily.  . [DISCONTINUED] polyethylene glycol (MIRALAX / GLYCOLAX) packet Take 17 g by mouth daily.   No facility-administered encounter medications on file as of 06/12/2017.      SIGNIFICANT DIAGNOSTIC EXAMS  PREVIOUS  06-14-16: diagnostic right mammogram: benign cyst  12-31-16: right lower extremity doppler: no thrombus identified; unfortunately obscured exam. Right superficial femoral  vein cannot be identified due to patient positioning. The profunda femoral and popliteal were patient. Right posterior tibial was obscured.   02-22-17: ct of abdomen and pelvis Bladder wall thickening with hazy irregular perivesicular margins compatible with cystitis; recommend correlation with urinalysis. Minimal sigmoid diverticulosis. RIGHT lower lobe infiltrate question pneumonia or aspiration. No definite acute intra-abdominal or intrapelvic abnormalities otherwise seen.  Aortic Atherosclerosis  05-06-17: chest x-ray: No acute cardiopulmonary abnormality seen.  05-06-17: ct angio of chest: 1. No pulmonary embolus identified. 2. Diffuse peribronchial thickening with debris in the mainstem  bronchi and extending into the lower lobes. Findings may represent acute bronchitis or aspiration given the distribution. No consolidation. 3. Mildly patulous esophagus. 4. Aorta and coronary artery calcific atherosclerosis.  05-07-17: kub: 1. Dilated loops of small bowel with very little colonic bowel gas. Possible ileus but cannot exclude partial small bowel obstruction. CT the abdomen pelvis may be helpful if warranted. 2. Gastrostomy tube is present. 3. Osteopenia.   05-07-17: ct of abdomen and pelvis:  1. Focally dilated segment of small bowel within the right abdomen with abrupt transition to decompressed distal small bowel in the central upper pelvis with decompressed distal small bowel and colon; overall pattern favors a mechanical small bowel obstruction. Given the focal appearance and right-sided distribution of the dilated small bowel, could consider closed loop obstruction although no definite mesenteric swirling or mesenteric vascular crowding is identified. Negative for intramural air or free air. 2. Development of trace right pleural effusion with consolidation at the right base which may reflect atelectasis or pneumonia 3. Patchy increased cortical density in the right kidney, suspect for delayed  nephrogram as may be seen with compromised renal function. 4. Layering stones or sludge within the gallbladder.   05-08-17: 2-d echo: - Left ventricle: The cavity size was normal. There was mild concentric hypertrophy. Systolic function was vigorous. The estimated ejection fraction was in the range of 65% to 70%. Wall motion was normal; there were no regional wall motion abnormalities. Doppler parameters are consistent with abnormal left ventricular relaxation (grade 1 diastolic dysfunction). There was no evidence of elevated ventricular filling pressure by  Doppler parameters. - Aortic valve: There was no regurgitation. - Aortic root: The aortic root was normal in size. - Mitral valve: There was no regurgitation. - Left atrium: The atrium was normal in size. - Right ventricle: Systolic function was normal. - Right atrium: The atrium was normal in size. - Pulmonic valve: There was no regurgitation. - Inferior vena cava: The vessel was normal in size. - Pericardium, extracardiac: A mild anteriorly located pericardial effusion was identified. Features were not consistent with  tamponade physiology.  05-09-17: right foot x-ray: 1. No radiographic findings of osteomyelitis. 2. Osteoporosis.  05-12-17: MRI right knee: 1. Incomplete study as only three axial sequences were obtained. The patient refused further imaging due to pain. 2. No joint effusion, excluding septic arthritis. 3. 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 No evidence of osteomyelitis.  05-13-17: MRI right ankle: 1. No osteomyelitis of right ankle. 2. No MRI evidence of septic arthritis of the right ankle. 3. Soft tissue ulcer overlying the posterior calcaneus.  NO NEW EXAMS      LABS REVIEWED: PREVIOUS   06-14-16: wbc 8.4; hgb 11.9; hct 36.6; mcv 95.4; plt 203; glucose 134; bun 30.7; creat 0.62; k+ 4.5 ;na++ 149; liver normal albumin 4.2; vit B 12: 521; folate 19.1; chol 155; ldl 85; trig 181; hdl 34; hgb a1c 5.7  8-18: hgb  a1c 5.4 09-24-16: urine micro-albumin <1.2 10-11-16: glucose 133; bun 25.6; creat 0.63; k+ 4.8; na++ 140; ca 9.9; liver normal albumin 4.3 10-22-16: glucose 131; bun 29.0; creat 0.62; k+ 4.7; na++ 141; ca 9.8; liver normal albumin 4.3 01-06-17: wb 8.1; hgb 15.0; hct 45.0; mcv  93.2; plt 199; glucose 99; bun 20; creat 0.74; k+ 4.0; na++ 142; ca 10.7  01-25-17: hgb a1c 6.1; chol 187; ldl 117; trig 184; hdl 31  02-14-17: wbc 28.0; hgb 13.9; hct 41.0; mcv 90.5 ;plt 297  glucose 165; bun 34.4; creat 1.04; k+  4.1; NA++ 156; ALT 38 AST 22; alk phos  133; albumin 4.2 blood culture: no growth urine culture no growth 02-14-17 (ED):  Wbc 33.7; hgb 13.4; hct 42.8; mcv 99.5; plt 279 glucose 210; bun 41; creat 1.Rachel; k+ 3.6; na++ 155; ca 9.9; liver normal albumin 3.4  02-15-17: blood culture: no growth; flu: neg HIV: nr; hgb a1c 6.1 02-17-17: wbc 18.4; hgb 9.4; hct 31.6; mcv 97.8; plt 214; glucose 100; bun 23; creat 0.75; k+ 3.2; na++150 liver normal albumin 2.4 mag 2.3 phos 3.0  02-22-17: wbc 13.3; hgb 10.5; hct 33.6; mcv 94.6; plt 424; glucose 102; bun 16; creat 0.58; k+ 4.0; na++ 138; ca 9.9 02-24-17: wbc  11.2; hgb 11.2; hct 35.2; mcv 95.7; plt 387; glucose 115; bun 11; creat 0.57; k+ 4.2; na++ 138; ca 9.4 05-06-17: wbc 16.7; hgb 14.5; hct  47.9; mcv 96.8; plt 306; glucose 251; bun 68; creat 2.68; k+ 3.5; na++ 154; ca 9.1; blood culture: MRSA: urine culture: no growth 05-07-17: glucose 109; bun 60; creat 2.10; k+ 4.7; na++ 158; ca 7.4 ;liver normal albumin 2.3 05-09-17: wbc 10.0; hgb 11.8; hct Rachel.7; mcv 92.9; plt 210; glucose 121; bun 7; creat 0.77; k+ 3.0; na++ 142; ca 9.0; blood culture: no growth 05-10-17: wbc 8.5; hgb 11.0; hct 35.1; mcv 92.4; plt 214; glucose 120; bun 6; creat 0.87; k+ 3.2; na++ 143; ca 9.2; hepatitis: neg 05-12-17: wbc 8.9; hgb 11.6; hct 36.0; mcv 91.4; plt 231; glucose 113; bun 8; creat 0.70; k+ 3.6; na++ 141; ca 9.3 05-29-17: chol 80; ldl 31; trig 108; hdl 21; hgb a1c 6.1     NO NEW LABS.    Review  of Systems  Unable to perform ROS: Other (nonverbal )    Physical Exam  Constitutional: She appears well-developed and well-nourished. No distress.  Neck: Carotid bruit is present. No thyromegaly present.  Bilateral   Cardiovascular: Normal rate, regular rhythm, normal heart sounds and intact distal pulses.  Pulmonary/Chest: Effort normal and breath sounds normal. No respiratory distress.  Abdominal: Soft. Bowel sounds are normal. She exhibits no distension. There is no tenderness.  Peg tube in place without signs of infection present   Musculoskeletal: She exhibits no edema.  Left hemiplegia   Lymphadenopathy:    She has no cervical adenopathy.  Neurological: She is alert.  Skin: Skin is warm and dry. She is not diaphoretic.  Psychiatric: She has a normal mood and affect.      ASSESSMENT/ PLAN:  TODAY  1. Urine retention: stable will continue urecholine 10 mg three times daily  ; will not make changes will not make changes.   2. Type 2 diabetes mellitus with neurological manifestations: is stable  hgb a1c is 6.1 (previous 5.4) ; is currently off medications; will continue to monitor her status.  ldl is 85 is on statin.   3. Seizures: is stable  no reports of seizure activity present: will continue keppra 500 mg twice daily and will monitor   4. 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   PREVIOUS   5. Essential benign hypertension with supraventricular tachycardia:  stable: b/p 128/66  pulse: 88: will continue lopressor 25 mg twice daily   6.  Dysphagia  paraesophageal  phase : stable  no signs of aspiration present she is npo; she is dependent upon peg tube feeding.  7. 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    8. Dyslipidemia associated with type 2 diabetes mellitus: stable ldl 31  continue crestor 20 mg daily will monitor   9. gerd without esophagitis: stable will continue zantac 150 mg daily   10. Slow transit constipation: is status post SBO: will continue miralax 17 gm daily   11. Insomnia: is stable will continue trazodone 50 mg nightly   MD is aware of resident's narcotic use and is in agreement with current plan of care. We will attempt to wean resident as apropriate   Ok Edwards NP [facility]  Contact [phone] Monday through Friday 8am- 5pm  After hours call [phone]

## 2017-06-17 LAB — HM MAMMOGRAPHY

## 2017-06-18 ENCOUNTER — Encounter: Payer: Self-pay | Admitting: *Deleted

## 2017-07-11 ENCOUNTER — Encounter: Payer: Self-pay | Admitting: Internal Medicine

## 2017-07-11 ENCOUNTER — Inpatient Hospital Stay: Payer: Medicaid Other | Admitting: Family

## 2017-07-11 ENCOUNTER — Non-Acute Institutional Stay (SKILLED_NURSING_FACILITY): Payer: Medicaid Other | Admitting: Internal Medicine

## 2017-07-11 DIAGNOSIS — I2782 Chronic pulmonary embolism: Secondary | ICD-10-CM

## 2017-07-11 DIAGNOSIS — E1149 Type 2 diabetes mellitus with other diabetic neurological complication: Secondary | ICD-10-CM

## 2017-07-11 DIAGNOSIS — R569 Unspecified convulsions: Secondary | ICD-10-CM

## 2017-07-11 DIAGNOSIS — I69351 Hemiplegia and hemiparesis following cerebral infarction affecting right dominant side: Secondary | ICD-10-CM | POA: Diagnosis not present

## 2017-07-11 DIAGNOSIS — Z931 Gastrostomy status: Secondary | ICD-10-CM | POA: Diagnosis not present

## 2017-07-11 DIAGNOSIS — I825Y2 Chronic embolism and thrombosis of unspecified deep veins of left proximal lower extremity: Secondary | ICD-10-CM | POA: Diagnosis not present

## 2017-07-11 DIAGNOSIS — I6930 Unspecified sequelae of cerebral infarction: Secondary | ICD-10-CM

## 2017-07-11 DIAGNOSIS — I10 Essential (primary) hypertension: Secondary | ICD-10-CM

## 2017-07-11 NOTE — Progress Notes (Signed)
Patient ID: Rachel Vang, female   DOB: November 09, 1965, 52 y.o.   MRN: [id number]  Location:  [name] Room Number: 208 A Place of Service:  SNF (31) Provider:  DR Jaston Havens Ivan Croft, DO  Patient Care Team: Kirt Boys, DO as PCP - General (Internal Medicine) Sharee Holster, NP as Nurse Practitioner (Geriatric Medicine) Center, [facility] ([facility])  Extended Emergency Contact Information Primary Emergency Contact: Silas [place] Mobile Phone: [phone] Relation: Son Secondary Emergency Contact: Debroah Baller [place] Mobile Phone: [phone] Relation: Son  Code Status:  Full Code Goals of care: Advanced Directive information Advanced Directives 07/11/2017  Does Patient Have a Medical Advance Directive? Yes  Type of Advance Directive Out of facility DNR (pink MOST or yellow form)  Does patient want to make changes to medical advance directive? No - Patient declined  Copy of Healthcare Power of Attorney in Chart? -  Would patient like information on creating a medical advance directive? No - Patient declined  Pre-existing out of facility DNR order (yellow form or pink MOST form) Pink MOST form placed in chart (order not valid for inpatient use)     Chief Complaint  Patient presents with  . Medical Management of Chronic Issues    1 Month follow up    HPI:  Pt is a 52 y.o. female seen today for medical management of chronic diseases.  She has no concerns. No N/V. No abdominal pain. No f/c. She is tolerating TF. No falls. She is a poor historian due to expressive aphasia. Hx obtained from chart. CBGs 110-130s; occasionally > 150  Urine retention with hx recurrent UTI -  stable on urecholine 10 mg three times daily   DM - diet controlled. A1c 6.1%; LDL 58 on statin.   Seizure d/o - controlled on keppra 500 mg twice daily  Chronic LLE DVT/chronic pulmonary embolism -  stable on long term anticoagulation therapy with weight based lovenox (currently 60 mg) twice daily; not a candidate for xarelto or eliquis due to history of GI bleed; not appropriate for coumadin therapy as unable to obtain/maintain therapeutic INR.  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    Dyslipidemia - stable on crestor 20 mg daily; LDL 58; HDL 29 (poor exercise tolerance due to right hemiparesis)  GERD - stable on zantac 150 mg daily   Slow transit constipation - she has a hx SBO; regular BMs on miralax 17 gm daily   Insomnia - stable on trazodone 50 mg nightly   Past Medical History:  Diagnosis Date  . Acute pulmonary embolism (HCC) 02/19/2015  . Acute respiratory failure (HCC)   . Diabetes mellitus without complication (HCC)    Type 2, W/o complications  . DVT (deep venous thrombosis) (HCC) 04/05/2015  . Dysphagia   . Epilepsy (HCC)   . GERD (gastroesophageal reflux disease)   . Hyperlipidemia   . Hypertension   . IBS (irritable bowel syndrome)   . Nontraumatic subarachnoid hemorrhage (HCC)   . SAH (subarachnoid hemorrhage) (HCC)   . Urinary retention    Past Surgical History:  Procedure Laterality Date  . ABDOMINAL SURGERY    .  ANEURYSM COILING    . COLONOSCOPY N/A 02/20/2015   Procedure: COLONOSCOPY;  Surgeon: Iva Boop, MD;  Location: [facility] ENDOSCOPY;  Service: Endoscopy;  Laterality: N/A;  . ESOPHAGOGASTRODUODENOSCOPY (EGD) WITH PROPOFOL N/A 02/02/2015   Procedure: ESOPHAGOGASTRODUODENOSCOPY (EGD) WITH PROPOFOL;  Surgeon: Jimmye Norman, MD;  Location: [facility] ENDOSCOPY;  Service: General;  Laterality: N/A;  . IR GENERIC HISTORICAL  08/26/2015   IR GASTRIC TUBE PERC CHG W/O IMG GUIDE 08/26/2015 Irish Lack,  MD [facility]-INTERV RAD  . IR GENERIC HISTORICAL  09/14/2015   IR REPLC GASTRO/COLONIC TUBE PERCUT W/FLUORO 09/14/2015 Darrell K Allred, PA-C [facility]-INTERV RAD  . IR REPLACE G-TUBE SIMPLE WO FLUORO  06/08/2016  . IR REPLACE G-TUBE SIMPLE WO FLUORO  10/24/2016  . IR REPLACE G-TUBE SIMPLE WO FLUORO  01/07/2017  . PEG PLACEMENT N/A 02/02/2015   Procedure: PERCUTANEOUS ENDOSCOPIC GASTROSTOMY (PEG) PLACEMENT;  Surgeon: Jimmye Norman, MD;  Location: [facility] ENDOSCOPY;  Service: General;  Laterality: N/A;  . RADIOLOGY WITH ANESTHESIA N/A 01/13/2015   Procedure: RADIOLOGY WITH ANESTHESIA;  Surgeon: Lisbeth Renshaw, MD;  Location: [facility] OR;  Service: Radiology;  Laterality: N/A;  . TRACHEOSTOMY      No Known Allergies  Outpatient Encounter Medications as of 07/11/2017  Medication Sig  . acetaminophen (TYLENOL) 325 MG tablet Place 650 mg into feeding tube every 4 (four) hours as needed for mild pain or fever.   . bethanechol (URECHOLINE) 10 MG tablet Place 10 mg into feeding tube 3 (three) times daily.   Marland Kitchen enoxaparin (LOVENOX) 60 MG/0.6ML injection Inject 0.6 mLs (60 mg total) into the skin 2 (two) times daily.  Marland Kitchen gabapentin (NEURONTIN) 100 MG capsule Take 200 mg by mouth 2 (two) times daily. HOLD IF LETHARGIC  . levETIRAcetam (KEPPRA) 100 MG/ML solution Place 5 mLs (500 mg total) into feeding tube 2 (two) times daily.  . metoprolol tartrate (LOPRESSOR) 25 MG tablet Take 25 mg by mouth 2 (two) times daily. Hold id SBP < 100 apical pulse  . Multiple Vitamin (MULTIVITAMIN) tablet Take 1 tablet by mouth daily.  . Nutritional Supplements (FEEDING SUPPLEMENT, JEVITY 1.5 CAL,) LIQD Continue with 42 cc per hour and flush with 200 cc every 4 hours  . polyethylene glycol (MIRALAX / GLYCOLAX) packet Take 17 g by mouth daily.  . Probiotic CAPS Place 1 capsule into feeding tube 2 (two) times daily.  . ranitidine (ZANTAC) 150 MG tablet Place 150 mg into feeding tube daily.   . rosuvastatin (CRESTOR) 20 MG tablet Place 20 mg into feeding  tube at bedtime.   . sertraline (ZOLOFT) 50 MG tablet Place 50 mg into feeding tube daily.  . traZODone (DESYREL) 50 MG tablet Place 50 mg into feeding tube at bedtime.  . Water For Irrigation, Sterile (FREE WATER) SOLN Place 200 mLs into feeding tube 4 (four) times daily.   No facility-administered encounter medications on file as of 07/11/2017.     Review of Systems  Unable to perform ROS: Other (expressive aphasia)    Immunization History  Administered Date(s) Administered  . Influenza-Unspecified 03/09/2015, 11/09/2015, 11/12/2016  . PPD Test 10/07/2015, 10/14/2015   Pertinent  Health Maintenance Due  Topic Date Due  . OPHTHALMOLOGY EXAM  10/17/2017 (Originally 10/05/2016)  . INFLUENZA VACCINE  08/29/2017  . URINE MICROALBUMIN  09/24/2017  . HEMOGLOBIN A1C  11/29/2017  . FOOT EXAM  06/27/2018  . MAMMOGRAM  06/18/2019  . COLONOSCOPY  02/19/2025  . PAP SMEAR  Discontinued   No flowsheet data found. Functional Status Survey:  Vitals:   07/11/17 0954  BP: 136/70  Pulse: 70  Resp: 18  Temp: 97.9 F (36.6 C)  SpO2: 96%  Weight: 129 lb (58.5 kg)  Height: 5' (1.524 m)   Body mass index is 25.19 kg/m. Physical Exam  Constitutional: She appears well-developed and well-nourished.  Lying in bed in NAD  HENT:  Mouth/Throat: Oropharynx is clear and moist. No oropharyngeal exudate.  MMM; no oral thrush  Eyes: Pupils are equal, round, and reactive to light. No scleral icterus.  Neck: Neck supple. Carotid bruit is not present. No tracheal deviation present. No thyromegaly present.  Cardiovascular: Normal rate, regular rhythm, normal heart sounds and intact distal pulses. Exam reveals no gallop and no friction rub.  No murmur heard. Right pedal edema. No LLE swelling. no calf TTP.   Pulmonary/Chest: Effort normal and breath sounds normal. No stridor. No respiratory distress. She has no wheezes. She has no rales.  Abdominal: Soft. Normal appearance and bowel sounds are  normal. She exhibits no distension and no mass. There is no hepatomegaly. There is tenderness (epigastric). There is no rigidity, no rebound and no guarding. No hernia.  Peg tube intact with TF running  Musculoskeletal: She exhibits edema and deformity (LUE contracture).  Lymphadenopathy:    She has no cervical adenopathy.  Neurological: She is alert.  Right hemiparesis  Skin: Skin is warm and dry. No rash noted.  Psychiatric: She has a normal mood and affect. Her behavior is normal. Thought content normal.    Labs reviewed: Recent Labs    02/16/17 0614  02/17/17 0319  05/08/17 0242  05/10/17 [id number] 05/11/17 0635 05/12/17 0818 05/14/17 06/03/17  NA 151*   < >  --    < > 152*   < > 143 139 141 144 147  K 4.1   < >  --    < > 2.4*   < > 3.2* 4.2 3.6 4.3 5.2  CL 111   < >  --    < > 117*   < > 109 109 106  --   --   CO2 21*   < >  --    < > 24   < > 23 19* 24  --   --   GLUCOSE 124*   < >  --    < > 132*   < > 120* 108* 113*  --   --   BUN 15   < >  --    < > 20   < > 6 8 8 12 18   CREATININE 0.88   < >  --    < > 1.07*   < > 0.87 0.76 0.70 0.8 0.6  CALCIUM 9.5   < >  --    < > 8.7*   < > 9.2 8.8* 9.3  --   --   MG  --   --  2.3  --  2.0  --   --   --   --   --   --   PHOS 2.3*  --  3.0  --   --   --   --   --   --   --   --    < > = values in this interval not displayed.   Recent Labs    02/19/17 0757 02/20/17 0844 05/07/17 0041 05/14/17  AST 25 28 33 15  ALT 38 31 15 29   ALKPHOS 101 97 52 108  BILITOT 0.5 0.7  0.6  --   PROT 6.7 7.1 5.3*  --   ALBUMIN 2.6* 2.7* 2.3*  --    Recent Labs    05/06/17 2023  05/09/17 0555 05/10/17 0633 05/12/17 0818 05/14/17 06/03/17  WBC 16.7*   < > 10.0 8.5 8.9 11.2 8.8  NEUTROABS 13.7*  --   --   --   --  8 6  HGB 14.5   < > 11.8* 11.0* 11.6* 10.9* 12.1  HCT 47.9*   < > 37.7 35.1* 36.0 34* 38  MCV 96.8   < > 92.9 92.4 91.4  --   --   PLT 306   < > 210 214 231 356 209   < > = values in this interval not displayed.   No results found  for: TSH Lab Results  Component Value Date   HGBA1C 6.1 05/29/2017   Lab Results  Component Value Date   CHOL 112 06/03/2017   HDL 29 (A) 06/03/2017   LDLCALC 58 06/03/2017   TRIG 127 06/03/2017    Significant Diagnostic Results in last 30 days:  No results found.  Assessment/Plan   ICD-10-CM   1. S/P percutaneous endoscopic gastrostomy (PEG) tube placement (HCC) Z93.1   2. Hemiparesis affecting right side as late effect of stroke (HCC) I69.351   3. History of stroke with residual deficit I69.30   4. Other chronic pulmonary embolism without acute cor pulmonale (HCC) I27.82   5. Essential hypertension, benign I10   6. 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 Chronic deep vein thrombosis (DVT) of proximal vein of left lower extremity (HCC) I82.5Y2   8. Seizures (HCC) R56.9     Cont current meds as ordered  TF as ordered with water flushes - she is NPO  PT/OT/ST as indicated  Peg tube care as indicated  Will follow  Labs/tests ordered: cmp  Akito Boomhower S. Ancil Linsey  [facility] [address] [place], [place] [address] [phone] Cell (Monday-Friday 8 AM - 5 PM) [phone] After 5 PM and follow prompts

## 2017-07-12 LAB — BASIC METABOLIC PANEL
BUN: 9 (ref 4–21)
Creatinine: 0.6 (ref 0.5–1.1)
Glucose: 104
Potassium: 4.7 (ref 3.4–5.3)
Sodium: 142 (ref 137–147)

## 2017-07-12 LAB — HEPATIC FUNCTION PANEL
ALT: 76 — AB (ref 7–35)
AST: 34 (ref 13–35)
Alkaline Phosphatase: 123 (ref 25–125)
Bilirubin, Total: 0.2

## 2017-07-23 LAB — HEPATIC FUNCTION PANEL
ALT: 85 — AB (ref 7–35)
AST: 37 — AB (ref 13–35)
Alkaline Phosphatase: 121 (ref 25–125)
Bilirubin, Total: 0.3

## 2017-08-08 ENCOUNTER — Non-Acute Institutional Stay (SKILLED_NURSING_FACILITY): Payer: Medicaid Other | Admitting: Adult Health

## 2017-08-08 ENCOUNTER — Encounter: Payer: Self-pay | Admitting: Adult Health

## 2017-08-08 ENCOUNTER — Encounter (HOSPITAL_COMMUNITY): Payer: Self-pay | Admitting: Emergency Medicine

## 2017-08-08 ENCOUNTER — Other Ambulatory Visit: Payer: Self-pay

## 2017-08-08 ENCOUNTER — Emergency Department (HOSPITAL_COMMUNITY)
Admission: EM | Admit: 2017-08-08 | Discharge: 2017-08-09 | Disposition: A | Discharge status: Home / Self Care | Payer: Medicaid Other | Attending: Emergency Medicine | Admitting: Emergency Medicine

## 2017-08-08 DIAGNOSIS — I639 Cerebral infarction, unspecified: Secondary | ICD-10-CM | POA: Diagnosis not present

## 2017-08-08 DIAGNOSIS — R21 Rash and other nonspecific skin eruption: Secondary | ICD-10-CM | POA: Insufficient documentation

## 2017-08-08 DIAGNOSIS — E1169 Type 2 diabetes mellitus with other specified complication: Secondary | ICD-10-CM

## 2017-08-08 DIAGNOSIS — E119 Type 2 diabetes mellitus without complications: Secondary | ICD-10-CM | POA: Diagnosis not present

## 2017-08-08 DIAGNOSIS — Z87891 Personal history of nicotine dependence: Secondary | ICD-10-CM | POA: Diagnosis not present

## 2017-08-08 DIAGNOSIS — I10 Essential (primary) hypertension: Secondary | ICD-10-CM | POA: Insufficient documentation

## 2017-08-08 DIAGNOSIS — Z79899 Other long term (current) drug therapy: Secondary | ICD-10-CM | POA: Diagnosis not present

## 2017-08-08 DIAGNOSIS — Z931 Gastrostomy status: Secondary | ICD-10-CM | POA: Insufficient documentation

## 2017-08-08 DIAGNOSIS — Z8673 Personal history of transient ischemic attack (TIA), and cerebral infarction without residual deficits: Secondary | ICD-10-CM | POA: Insufficient documentation

## 2017-08-08 DIAGNOSIS — E785 Hyperlipidemia, unspecified: Secondary | ICD-10-CM | POA: Diagnosis not present

## 2017-08-08 DIAGNOSIS — R1314 Dysphagia, pharyngoesophageal phase: Secondary | ICD-10-CM

## 2017-08-08 LAB — I-STAT BETA HCG BLOOD, ED (MC, WL, AP ONLY)

## 2017-08-08 LAB — I-STAT CG4 LACTIC ACID, ED: LACTIC ACID, VENOUS: 1.08 mmol/L (ref 0.5–1.9)

## 2017-08-08 NOTE — Progress Notes (Signed)
Location:   [place] Room Number: 208 A Place of Service:  SNF (31)   CODE STATUS: Full Code (Most form updated 08/21/16)  No Known Allergies  Chief Complaint  Patient presents with  . Medical Management of Chronic Issues    Hypertension; cva; dysphagia; dyslipidemia.     HPI:  She is a 52 year old long term resident of this facility being seen for the management of her chronic illnesses: hypertension; cva; dysphagia; dyslipidemia. She is unable to participate in the hpi or ros. There are no reports of uncontrolled pain; no signs of aspiration. Her right side remains weak. There are no nursing concerns at this time.   Past Medical History:  Diagnosis Date  . Acute pulmonary embolism ([place]) 02/19/2015  . Acute respiratory failure ([place])   . Diabetes mellitus without complication (HCC)    Type 2, W/o complications  . DVT (deep venous thrombosis) ([place]) 04/05/2015  . Dysphagia   . Epilepsy ([place])   . GERD (gastroesophageal reflux disease)   . Hyperlipidemia   . Hypertension   . IBS (irritable bowel syndrome)   . Nontraumatic subarachnoid hemorrhage ([place])   . SAH (subarachnoid hemorrhage) ([place])   . Urinary retention     Past Surgical History:  Procedure Laterality Date  . ABDOMINAL SURGERY    . ANEURYSM COILING    . COLONOSCOPY N/A 02/20/2015   Procedure: COLONOSCOPY;  Surgeon: Gatha Mayer, MD;  Location: [place];  Service: Endoscopy;  Laterality: N/A;  . ESOPHAGOGASTRODUODENOSCOPY (EGD) WITH PROPOFOL N/A 02/02/2015   Procedure: ESOPHAGOGASTRODUODENOSCOPY (EGD) WITH PROPOFOL;  Surgeon: Judeth Horn, MD;  Location: [place] [facility];  Service: General;  Laterality: N/A;  . IR GENERIC HISTORICAL  08/26/2015   IR GASTRIC TUBE PERC CHG W/O IMG GUIDE 08/26/2015 Aletta Edouard, MD [facility]  . IR GENERIC HISTORICAL  09/14/2015   IR REPLC GASTRO/COLONIC TUBE PERCUT W/FLUORO 09/14/2015 Darrell K Allred, PA-C [facility]  . IR REPLACE G-TUBE SIMPLE WO FLUORO   06/08/2016  . IR REPLACE G-TUBE SIMPLE WO FLUORO  10/24/2016  . IR REPLACE G-TUBE SIMPLE WO FLUORO  01/07/2017  . PEG PLACEMENT N/A 02/02/2015   Procedure: PERCUTANEOUS ENDOSCOPIC GASTROSTOMY (PEG) PLACEMENT;  Surgeon: Judeth Horn, MD;  Location: [place];  Service: General;  Laterality: N/A;  . RADIOLOGY WITH ANESTHESIA N/A 01/13/2015   Procedure: RADIOLOGY WITH ANESTHESIA;  Surgeon: Consuella Lose, MD;  Location: [facility];  Service: Radiology;  Laterality: N/A;  . TRACHEOSTOMY      Social History   Socioeconomic History  . Marital status: Single    Spouse name: Not on file  . Number of children: Not on file  . Years of education: Not on file  . Highest education level: Not on file  Occupational History  . Not on file  Social Needs  . Financial resource strain: Not on file  . Food insecurity:    Worry: Not on file    Inability: Not on file  . Transportation needs:    Medical: Not on file    Non-medical: Not on file  Tobacco Use  . Smoking status: Former Research scientist (life sciences)  . Smokeless tobacco: Never Used  Substance and Sexual Activity  . Alcohol use: No  . Drug use: No  . Sexual activity: Not on file  Lifestyle  . Physical activity:    Days per week: Not on file    Minutes per session: Not on file  . Stress: Not on file  Relationships  . Social connections:  Talks on phone: Not on file    Gets together: Not on file    Attends religious service: Not on file    Active member of club or organization: Not on file    Attends meetings of clubs or organizations: Not on file    Relationship status: Not on file  . Intimate partner violence:    Fear of current or ex partner: Not on file    Emotionally abused: Not on file    Physically abused: Not on file    Forced sexual activity: Not on file  Other Topics Concern  . Not on file  Social History Narrative  . Not on file   Family History  Problem Relation Age of Onset  . Hypertension Other       VITAL SIGNS BP 118/68    Pulse 73   Temp (!) 97.5 F (36.4 C)   Resp 18   Ht 5' (1.524 m)   Wt 128 lb (58.1 kg)   LMP  (LMP Unknown)   SpO2 97%   BMI 25.00 kg/m   Outpatient Encounter Medications as of 08/08/2017  Medication Sig  . acetaminophen (TYLENOL) 325 MG tablet Place 650 mg into feeding tube every 4 (four) hours as needed for mild pain or fever.   . bethanechol (URECHOLINE) 10 MG tablet Place 10 mg into feeding tube 3 (three) times daily.   Marland Kitchen enoxaparin (LOVENOX) 60 MG/0.6ML injection Inject 60 mg into the skin every 12 (twelve) hours.  . gabapentin (NEURONTIN) 100 MG capsule Take 200 mg by mouth 2 (two) times daily. HOLD IF LETHARGIC  . levETIRAcetam (KEPPRA) 100 MG/ML solution Place 5 mLs (500 mg total) into feeding tube 2 (two) times daily.  . metoprolol tartrate (LOPRESSOR) 25 MG tablet Take 25 mg by mouth 2 (two) times daily. Hold id SBP < 100 apical pulse  . Multiple Vitamin (MULTIVITAMIN) tablet Place 1 tablet into feeding tube daily.   . Nutritional Supplements (FEEDING SUPPLEMENT, JEVITY 1.5 CAL,) LIQD Continue with 42 cc per hour and flush with 200 cc every 4 hours  . polyethylene glycol (MIRALAX / GLYCOLAX) packet Take 17 g by mouth daily.   . Probiotic CAPS Place 1 capsule into feeding tube 2 (two) times daily.  . ranitidine (ZANTAC) 150 MG tablet Place 150 mg into feeding tube daily.   . rosuvastatin (CRESTOR) 20 MG tablet Place 20 mg into feeding tube at bedtime.   . sertraline (ZOLOFT) 100 MG tablet Place 100 mg into feeding tube daily.   . traZODone (DESYREL) 50 MG tablet Place 50 mg into feeding tube at bedtime.  . Water For Irrigation, Sterile (FREE WATER) SOLN Place 200 mLs into feeding tube 4 (four) times daily.  . [DISCONTINUED] enoxaparin (LOVENOX) 60 MG/0.6ML injection Inject 0.6 mLs (60 mg total) into the skin 2 (two) times daily. (Patient not taking: Reported on 08/08/2017)   No facility-administered encounter medications on file as of 08/08/2017.      SIGNIFICANT DIAGNOSTIC  EXAMS  PREVIOUS  06-14-16: diagnostic right mammogram: benign cyst  02-22-17: ct of abdomen and pelvis Bladder wall thickening with hazy irregular perivesicular margins compatible with cystitis; recommend correlation with urinalysis. Minimal sigmoid diverticulosis. RIGHT lower lobe infiltrate question pneumonia or aspiration. No definite acute intra-abdominal or intrapelvic abnormalities otherwise seen.  Aortic Atherosclerosis  05-06-17: chest x-ray: No acute cardiopulmonary abnormality seen.  05-06-17: ct angio of chest: 1. No pulmonary embolus identified. 2. Diffuse peribronchial thickening with debris in the mainstem bronchi and extending into the  lower lobes. Findings may represent acute bronchitis or aspiration given the distribution. No consolidation. 3. Mildly patulous esophagus. 4. Aorta and coronary artery calcific atherosclerosis.  05-07-17: kub: 1. Dilated loops of small bowel with very little colonic bowel gas. Possible ileus but cannot exclude partial small bowel obstruction. CT the abdomen pelvis may be helpful if warranted. 2. Gastrostomy tube is present. 3. Osteopenia.   05-07-17: ct of abdomen and pelvis:  1. Focally dilated segment of small bowel within the right abdomen with abrupt transition to decompressed distal small bowel in the central upper pelvis with decompressed distal small bowel and colon; overall pattern favors a mechanical small bowel obstruction. Given the focal appearance and right-sided distribution of the dilated small bowel, could consider closed loop obstruction although no definite mesenteric swirling or mesenteric vascular crowding is identified. Negative for intramural air or free air. 2. Development of trace right pleural effusion with consolidation at the right base which may reflect atelectasis or pneumonia 3. Patchy increased cortical density in the right kidney, suspect for delayed nephrogram as may be seen with compromised renal function. 4. Layering  stones or sludge within the gallbladder.   05-08-17: 2-d echo: - Left ventricle: The cavity size was normal. There was mild concentric hypertrophy. Systolic function was vigorous. The estimated ejection fraction was in the range of 65% to 70%. Wall motion was normal; there were no regional wall motion abnormalities. Doppler parameters are consistent with abnormal left ventricular relaxation (grade 1 diastolic dysfunction). There was no evidence of elevated ventricular filling pressure by  Doppler parameters. - Aortic valve: There was no regurgitation. - Aortic root: The aortic root was normal in size. - Mitral valve: There was no regurgitation. - Left atrium: The atrium was normal in size. - Right ventricle: Systolic function was normal. - Right atrium: The atrium was normal in size. - Pulmonic valve: There was no regurgitation. - Inferior vena cava: The vessel was normal in size. - Pericardium, extracardiac: A mild anteriorly located pericardial effusion was identified. Features were not consistent with  tamponade physiology.  05-09-17: right foot x-ray: 1. No radiographic findings of osteomyelitis. 2. Osteoporosis.  05-12-17: MRI right knee: 1. Incomplete study as only three axial sequences were obtained. The patient refused further imaging due to pain. 2. No joint effusion, excluding septic arthritis. 3. 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 No evidence of osteomyelitis.  05-13-17: MRI right ankle: 1. No osteomyelitis of right ankle. 2. No MRI evidence of septic arthritis of the right ankle. 3. Soft tissue ulcer overlying the posterior calcaneus.  NO NEW EXAMS      LABS REVIEWED: PREVIOUS   8-18: hgb a1c 5.4 09-24-16: urine micro-albumin <1.2 10-11-16: glucose 133; bun 25.6; creat 0.63; k+ 4.8; na++ 140; ca 9.9; liver normal albumin 4.3 10-22-16: glucose 131; bun 29.0; creat 0.62; k+ 4.7; na++ 141; ca 9.8; liver normal albumin 4.3 01-06-17: wb 8.1; hgb 15.0; hct 45.0; mcv  93.2; plt  199; glucose 99; bun 20; creat 0.74; k+ 4.0; na++ 142; ca 10.7  01-25-17: hgb a1c 6.1; chol 187; ldl 117; trig 184; hdl 31  02-14-17: wbc 28.0; hgb 13.9; hct 41.0; mcv 90.5 ;plt 297  glucose 165; bun 34.4; creat 1.04; k+ 4.1; NA++ 156; ALT 38 AST 22; alk phos  133; albumin 4.2 blood culture: no growth urine culture no growth 02-14-17 (ED):  Wbc 33.7; hgb 13.4; hct 42.8; mcv 99.5; plt 279 glucose 210; bun 41; creat 1.37; k+ 3.6; na++ 155; ca 9.9; liver normal  albumin 3.4  02-15-17: blood culture: no growth; flu: neg HIV: nr; hgb a1c 6.1 02-17-17: wbc 18.4; hgb 9.4; hct 31.6; mcv 97.8; plt 214; glucose 100; bun 23; creat 0.75; k+ 3.2; na++150 liver normal albumin 2.4 mag 2.3 phos 3.0  02-22-17: wbc 13.3; hgb 10.5; hct 33.6; mcv 94.6; plt 424; glucose 102; bun 16; creat 0.58; k+ 4.0; na++ 138; ca 9.9 02-24-17: wbc  11.2; hgb 11.2; hct 35.2; mcv 95.7; plt 387; glucose 115; bun 11; creat 0.57; k+ 4.2; na++ 138; ca 9.4 05-06-17: wbc 16.7; hgb 14.5; hct  47.9; mcv 96.8; plt 306; glucose 251; bun 68; creat 2.68; k+ 3.5; na++ 154; ca 9.1; blood culture: MRSA: urine culture: no growth 05-07-17: glucose 109; bun 60; creat 2.10; k+ 4.7; na++ 158; ca 7.4 ;liver normal albumin 2.3 05-09-17: wbc 10.0; hgb 11.8; hct 37.7; mcv 92.9; plt 210; glucose 121; bun 7; creat 0.77; k+ 3.0; na++ 142; ca 9.0; blood culture: no growth 05-10-17: wbc 8.5; hgb 11.0; hct 35.1; mcv 92.4; plt 214; glucose 120; bun 6; creat 0.87; k+ 3.2; na++ 143; ca 9.2; hepatitis: neg 05-12-17: wbc 8.9; hgb 11.6; hct 36.0; mcv 91.4; plt 231; glucose 113; bun 8; creat 0.70; k+ 3.6; na++ 141; ca 9.3 05-29-17: chol 80; ldl 31; trig 108; hdl 21; hgb a1c 6.1     NO NEW LABS.    Review of Systems  Unable to perform ROS: Other (nonverbal )    Physical Exam  Constitutional: She appears well-developed and well-nourished. No distress.  Neck: Carotid bruit is present. No thyromegaly present.  Bilateral   Cardiovascular: Normal rate, regular rhythm, normal heart  sounds and intact distal pulses.  Pulmonary/Chest: Effort normal and breath sounds normal. No respiratory distress.  Abdominal: Soft. Bowel sounds are normal. She exhibits no distension. There is no tenderness.  Peg tube present without signs of infection present   Musculoskeletal: She exhibits no edema.  Left side hemiplegia   Lymphadenopathy:    She has no cervical adenopathy.  Neurological: She is alert.  Skin: Skin is warm and dry. She is not diaphoretic.  Psychiatric: She has a normal mood and affect.    ASSESSMENT/ PLAN:  TODAY  1. Essential benign hypertension with supraventricular tachycardia:  stable: b/p 128/66  pulse: 88: will continue lopressor 25 mg twice daily   2.  Dysphagia paraesophageal  phase : stable  no signs of aspiration present she is npo; she is dependent upon peg tube feeding.  3. 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    4. Dyslipidemia associated with type 2 diabetes mellitus: stable ldl 31  continue crestor 20 mg daily will monitor   PREVIOUS   5. gerd without esophagitis: stable will continue zantac 150 mg daily   6. Slow transit constipation: is status post SBO: will continue miralax 17 gm daily   7. Insomnia: is stable will continue trazodone 50 mg nightly   8. Urine retention: stable will continue urecholine 10 mg three times daily  ; will not make changes will not make changes.   9. Type 2 diabetes mellitus with neurological manifestations: is stable  hgb a1c is 6.1 (previous 5.4) ; is currently off medications; will continue to monitor her status.  ldl is 85 is on statin.   10. Seizures: is stable  no reports of seizure activity present: will continue keppra 500 mg twice daily and  will monitor   11. DVT (chronic left lower extremity) and chronic pulmonary embolism: is stable  she does require long term  anticoagulation therapy; will continue lovenox 60  mg twice daily (weigt based); is not a candidate for xarelto; or eliquis due to her history of GI bleed; she is not appropriate for coumadin therapy; unable  to obtain adequate INR.   MD is aware of resident's narcotic use and is in agreement with current plan of care. We will attempt to wean resident as apropriate   Ok Edwards NP [facility]  Contact [phone] Monday through Friday 8am- 5pm  After hours call [phone]

## 2017-08-08 NOTE — ED Notes (Signed)
Placed pt on PW. 

## 2017-08-08 NOTE — ED Triage Notes (Addendum)
Patient arrived via EMS, from [facility]. Per EMS: c/o LUQ pain for 2 hours, no tenderness, no n/v/d. Has a g-tube in the same area, rash present upon assessment-very painful . Patient is nonverbal and this is her baseline but she does follow commands and is alert.

## 2017-08-09 LAB — COMPREHENSIVE METABOLIC PANEL
ALBUMIN: 3.8 g/dL (ref 3.5–5.0)
ALT: 95 U/L — AB (ref 0–44)
AST: 49 U/L — AB (ref 15–41)
Alkaline Phosphatase: 104 U/L (ref 38–126)
Anion gap: 12 (ref 5–15)
BUN: 11 mg/dL (ref 6–20)
CHLORIDE: 103 mmol/L (ref 98–111)
CO2: 27 mmol/L (ref 22–32)
Calcium: 10.4 mg/dL — ABNORMAL HIGH (ref 8.9–10.3)
Creatinine, Ser: 0.83 mg/dL (ref 0.44–1.00)
GFR calc Af Amer: >60 mL/min (ref >60)
GFR calc non Af Amer: >60 mL/min (ref >60)
GLUCOSE: 105 mg/dL — AB (ref 70–99)
Potassium: 3.7 mmol/L (ref 3.5–5.1)
Sodium: 142 mmol/L (ref 135–145)
Total Bilirubin: 0.3 mg/dL (ref 0.3–1.2)
Total Protein: 8.3 g/dL — ABNORMAL HIGH (ref 6.5–8.1)

## 2017-08-09 LAB — CBC WITH DIFFERENTIAL/PLATELET
Abs Immature Granulocytes: 0 10*3/uL (ref 0.0–0.1)
Basophils Absolute: 0 10*3/uL (ref 0.0–0.1)
Basophils Relative: 0 %
EOS PCT: 1 %
Eosinophils Absolute: 0.1 10*3/uL (ref 0.0–0.7)
HEMATOCRIT: 39.7 % (ref 36.0–46.0)
HEMOGLOBIN: 12.4 g/dL (ref 12.0–15.0)
Immature Granulocytes: 0 %
LYMPHS ABS: 2.2 10*3/uL (ref 0.7–4.0)
LYMPHS PCT: 24 %
MCH: 28.7 pg (ref 26.0–34.0)
MCHC: 31.2 g/dL (ref 30.0–36.0)
MCV: 91.9 fL (ref 78.0–100.0)
MONO ABS: 0.7 10*3/uL (ref 0.1–1.0)
MONOS PCT: 8 %
Neutro Abs: 5.9 10*3/uL (ref 1.7–7.7)
Neutrophils Relative %: 67 %
Platelets: 290 10*3/uL (ref 150–400)
RBC: 4.32 MIL/uL (ref 3.87–5.11)
RDW: 14.9 % (ref 11.5–15.5)
WBC: 9 10*3/uL (ref 4.0–10.5)

## 2017-08-09 MED ORDER — ACYCLOVIR 400 MG PO TABS: ORAL_TABLET | ORAL | 0 refills | Status: DC

## 2017-08-09 MED ORDER — NYSTATIN 100000 UNIT/GM EX POWD: Freq: Three times a day (TID) | CUTANEOUS | 0 refills | Status: DC

## 2017-08-09 NOTE — ED Notes (Signed)
[facility] called  

## 2017-08-09 NOTE — ED Notes (Signed)
Correct number for [facility] [phone]

## 2017-08-09 NOTE — ED Provider Notes (Signed)
[facility] EMERGENCY DEPARTMENT Provider Note   CSN: [id number] Arrival date & time: 08/08/17  2300     History   Chief Complaint Chief Complaint  Patient presents with  . Abdominal Pain   Level 5 caveat as patient is nonverbal HPI Rachel Vang is a 52 y.o. female.  The history is provided by the patient.  Rash   This is a new problem. Episode onset: Unknown. The problem has been gradually worsening. There has been no fever. The rash is present on the torso. The pain is moderate. The pain has been constant since onset.   Patient with previous history of SAH, G-tube in place, history of DVT/PE, diabetes and nonverbal at baseline presents with abdominal pain and rash.  Per EMS report patient has had pain and rash to left upper quadrant for 2 hours.  No vomiting/diarrhea No Other acute complaints are known Past Medical History:  Diagnosis Date  . Acute pulmonary embolism (HCC) 02/19/2015  . Acute respiratory failure (HCC)   . Diabetes mellitus without complication (HCC)    Type 2, W/o complications  . DVT (deep venous thrombosis) (HCC) 04/05/2015  . Dysphagia   . Epilepsy (HCC)   . GERD (gastroesophageal reflux disease)   . Hyperlipidemia   . Hypertension   . IBS (irritable bowel syndrome)   . Nontraumatic subarachnoid hemorrhage (HCC)   . SAH (subarachnoid hemorrhage) (HCC)   . Urinary retention     Patient Active Problem List   Diagnosis Date Noted  . Slow transit constipation 06/08/2017  . Insomnia 06/08/2017  . Generalized weakness 06/07/2017  . Aspiration pneumonia of both lower lobes due to gastric secretions (HCC)   . Demand ischemia (HCC)   . Chronic pain of right knee   . Chronic heel pain, right   . Small bowel obstruction (HCC)   . SVT (supraventricular tachycardia) (HCC)   . AKI (acute kidney injury) (HCC) 05/06/2017  . Hypernatremia 05/06/2017  . Dyslipidemia associated with type 2 diabetes mellitus (HCC) 04/27/2017  .  Gingivitis, acute, plaque induced 03/26/2017  . Hypersecretion of saliva 03/14/2017  . Bruxism (teeth grinding) 03/14/2017  . Lobar pneumonia (HCC)   . Acute on chronic respiratory failure with hypoxia (HCC)   . Pressure injury of skin 02/16/2017  . SOB (shortness of breath)   . Severe sepsis (HCC) 02/15/2017  . Foot drop, right foot 10/18/2016  . CVA (cerebrovascular accident) (HCC) 08/30/2016  . GERD without esophagitis 08/30/2016  . Chronic pulmonary embolism (HCC) 07/03/2016  . Weight loss, non-intentional 06/13/2016  . Hemiparesis affecting right side as late effect of stroke (HCC) 09/25/2015  . Type II diabetes mellitus with neurological manifestations (HCC) 05/20/2015  . Essential hypertension, benign 05/20/2015  . Status post insertion of percutaneous endoscopic gastrostomy (PEG) tube (HCC) 05/20/2015  . DVT (deep venous thrombosis) (HCC) 04/05/2015  . Protein-calorie malnutrition, severe (HCC) 03/25/2015  . Dysphagia 02/18/2015  . Seizures (HCC) 02/18/2015  . Elevated troponin 02/18/2015  . Urine retention 02/18/2015  . Sepsis (HCC) 02/17/2015  . History of ETT   . Subarachnoid hemorrhage [facility])     Past Surgical History:  Procedure Laterality Date  . ABDOMINAL SURGERY    . ANEURYSM COILING    . COLONOSCOPY N/A 02/20/2015   Procedure: COLONOSCOPY;  Surgeon: Iva Boop, MD;  Location[facility] ENDOSCOPY;  Service: Endoscopy;  Laterality: N/A;  . ESOPHAGOGASTRODUODENOSCOPY (EGD) WITH PROPOFOL N/A 02/02/2015   Procedure: ESOPHAGOGASTRODUODENOSCOPY (EGD) WITH PROPOFOL;  Surgeon: Jimmye Norman, MD;  Location:  [facility] ENDOSCOPY;  Service: General;  Laterality: N/A;  . IR GENERIC HISTORICAL  08/26/2015   IR GASTRIC TUBE PERC CHG W/O IMG GUIDE 08/26/2015 Irish LackGlenn Yamagata, MD [facility]-INTERV RAD  . IR GENERIC HISTORICAL  09/14/2015   IR REPLC GASTRO/COLONIC TUBE PERCUT W/FLUORO 09/14/2015 Darrell K Allred, PA-C [facility]-INTERV RAD  . IR REPLACE G-TUBE SIMPLE WO FLUORO  06/08/2016  . IR REPLACE G-TUBE SIMPLE  WO FLUORO  10/24/2016  . IR REPLACE G-TUBE SIMPLE WO FLUORO  01/07/2017  . PEG PLACEMENT N/A 02/02/2015   Procedure: PERCUTANEOUS ENDOSCOPIC GASTROSTOMY (PEG) PLACEMENT;  Surgeon: Jimmye NormanJames Wyatt, MD;  [facility];  Service: General;  Laterality: N/A;  . RADIOLOGY WITH ANESTHESIA N/A 01/13/2015   Procedure: RADIOLOGY WITH ANESTHESIA;  Surgeon: Lisbeth RenshawNeelesh Nundkumar, MD;  Location: [facility] OR;  Service: Radiology;  Laterality: N/A;  . TRACHEOSTOMY       OB History   None      Home Medications    Prior to Admission medications   Medication Sig Start Date End Date Taking? Authorizing Provider  acetaminophen (TYLENOL) 325 MG tablet Place 650 mg into feeding tube every 4 (four) hours as needed for mild pain or fever.     [provider]  bethanechol (URECHOLINE) 10 MG tablet Place 10 mg into feeding tube 3 (three) times daily.     [provider]  enoxaparin (LOVENOX) 60 MG/0.6ML injection Inject 60 mg into the skin every 12 (twelve) hours.    [provider]  gabapentin (NEURONTIN) 100 MG capsule Take 200 mg by mouth 2 (two) times daily. HOLD IF LETHARGIC 04/04/17   Ellwood Sayersananis, Leonard, MD  levETIRAcetam (KEPPRA) 100 MG/ML solution Place 5 mLs (500 mg total) into feeding tube 2 (two) times daily. 02/11/15   Leroy SeaSingh, Prashant K, MD  metoprolol tartrate (LOPRESSOR) 25 MG tablet Take 25 mg by mouth 2 (two) times daily. Hold id SBP < 100 apical pulse    [provider]  Multiple Vitamin (MULTIVITAMIN) tablet Place 1 tablet into feeding tube daily.     [provider]  Nutritional Supplements (FEEDING SUPPLEMENT, JEVITY 1.5 CAL,) LIQD Continue with 42 cc per hour and flush with 200 cc every 4 hours    [provider]  polyethylene glycol (MIRALAX / GLYCOLAX) packet Take 17 g by mouth daily.     [provider]  Probiotic CAPS Place 1 capsule into feeding tube 2 (two) times daily.    [provider]  ranitidine (ZANTAC) 150 MG tablet Place  150 mg into feeding tube daily.  04/25/17   [provider]  rosuvastatin (CRESTOR) 20 MG tablet Place 20 mg into feeding tube at bedtime.  04/25/17   [provider]  sertraline (ZOLOFT) 100 MG tablet Place 100 mg into feeding tube daily.  07/12/17   [provider]  traZODone (DESYREL) 50 MG tablet Place 50 mg into feeding tube at bedtime.    [provider]  Water For Irrigation, Sterile (FREE WATER) SOLN Place 200 mLs into feeding tube 4 (four) times daily. 02/22/17   Calvert Cantorizwan, Saima, MD    Family History Family History  Problem Relation Age of Onset  . Hypertension Other     Social History Social History   Tobacco Use  . Smoking status: Former Games developermoker  . Smokeless tobacco: Never Used  Substance Use Topics  . Alcohol use: No  . Drug use: No     Allergies   Patient has no known allergies.   Review of Systems Review  of Systems  Unable to perform ROS: Patient nonverbal  Skin: Positive for rash.     Physical Exam Updated Vital Signs BP (!) 123/92   Pulse (!) 106   Temp 99.5 F (37.5 C) (Rectal)   Resp 20   Ht 1.626 m (5\' 4" )   Wt 58.1 kg (128 lb)   LMP  (LMP Unknown)   SpO2 99%   BMI 21.97 kg/m   Physical Exam CONSTITUTIONAL: Frail, appears older than stated age HEAD: Normocephalic/atraumatic EYES: EOMI ENMT: Mucous membranes moist NECK: supple no meningeal signs CV: S1/S2 noted, no murmurs/rubs/gallops noted LUNGS: Scattered wheeze bilaterally, no distress ABDOMEN: soft, nontender, G-tube noted in left upper quadrant, rash noted to abdominal wall questionable zoster noted GU:no cva tenderness NEURO: Pt is awake/alert, she can shake her head yes or no with questioning EXTREMITIES: pulses normal/equal, full ROM SKIN: warm, color normal, as noted to left abdominal wall, rash noted under left breast, scattered papules to left scapula.  No signs of cellulitis.  G-tube appears clean dry and intact without localized rash female  chaperone present for exam PSYCH: unable To assess  ED Treatments / Results  Labs (all labs ordered are listed, but only abnormal results are displayed) Labs Reviewed  COMPREHENSIVE METABOLIC PANEL - Abnormal; Notable for the following components:      Result Value   Glucose, Bld 105 (*)    Calcium 10.4 (*)    Total Protein 8.3 (*)    AST 49 (*)    ALT 95 (*)    All other components within normal limits  CBC WITH DIFFERENTIAL/PLATELET  I-STAT CG4 LACTIC ACID, ED  I-STAT BETA HCG BLOOD, ED ([facility], [facility], [facility] ONLY)    EKG None  Radiology No results found.  Procedures Procedures   Medications Ordered in ED Medications - No data to display   Initial Impression / Assessment and Plan / ED Course  I have reviewed the triage vital signs and the nursing notes.  Pertinent labs results that were available during my care of the patient were reviewed by me and considered in my medical decision making (see chart for details).     12:25 AM Ability for concern for abdominal pain and rash.  One area of the rash has an appearance of zoster.  She is focally tender there, but no other focal abdominal tenderness.  The rash under her breast appears to be consistent with potential Candida.  She does have scattered papules to her back of unclear etiology.  No signs of cellulitis or abscess. 2:17 AM Please see nursing note after discussion with nursing staff at [facility] reassuring, vitals appropriate.  Patient is in no distress. One area of the rash could be consistent with herpes zoster.  The area under her breast is likely topical Candida.  Will treat for both.  Patient is otherwise stable and appropriate for discharge No signs of cellulitis, no abscess. Final Clinical Impressions(s) / ED Diagnoses   Final diagnoses:  Rash    ED Discharge Orders        Ordered    acyclovir (ZOVIRAX) 400 MG tablet     08/09/17 0216    nystatin (MYCOSTATIN/NYSTOP) powder  3 times daily      08/09/17 0216       Zadie Rhine, MD 08/09/17 [id number]

## 2017-08-09 NOTE — ED Notes (Signed)
Sp[facility] with pt [facility] who states that the patient is able to communicate through nodding yes and no. Approx 2 hours prior to calling EMS. Pt c/o LUQ abdominal pain and tenderness and at that time she noticed "redness" to her upper left abdomen but didn't know if it was just because she was laying on that side. She states that the patients pain improved while laying on her back with her feet elevated. Denies any previous skin issues/rashes, changes in medicines/soap/creams, and denies any residents recently diagnosed with shingles or chicken pox in the facility.

## 2017-09-06 ENCOUNTER — Other Ambulatory Visit: Payer: Self-pay

## 2017-09-06 ENCOUNTER — Emergency Department (HOSPITAL_COMMUNITY): Payer: Medicaid Other

## 2017-09-06 ENCOUNTER — Encounter (HOSPITAL_COMMUNITY): Payer: Self-pay

## 2017-09-06 ENCOUNTER — Observation Stay (HOSPITAL_COMMUNITY)
Admission: EM | Admit: 2017-09-06 | Discharge: 2017-09-08 | Disposition: A | Discharge status: Other Acute Inpatient Hospital | Payer: Medicaid Other | Attending: Internal Medicine | Admitting: Internal Medicine

## 2017-09-06 DIAGNOSIS — R509 Fever, unspecified: Secondary | ICD-10-CM | POA: Diagnosis not present

## 2017-09-06 DIAGNOSIS — Z7901 Long term (current) use of anticoagulants: Secondary | ICD-10-CM | POA: Insufficient documentation

## 2017-09-06 DIAGNOSIS — G40909 Epilepsy, unspecified, not intractable, without status epilepticus: Secondary | ICD-10-CM | POA: Insufficient documentation

## 2017-09-06 DIAGNOSIS — R4702 Dysphasia: Secondary | ICD-10-CM | POA: Diagnosis not present

## 2017-09-06 DIAGNOSIS — A419 Sepsis, unspecified organism: Secondary | ICD-10-CM

## 2017-09-06 DIAGNOSIS — Z79899 Other long term (current) drug therapy: Secondary | ICD-10-CM | POA: Diagnosis not present

## 2017-09-06 DIAGNOSIS — R1310 Dysphagia, unspecified: Secondary | ICD-10-CM

## 2017-09-06 DIAGNOSIS — Z931 Gastrostomy status: Secondary | ICD-10-CM | POA: Diagnosis not present

## 2017-09-06 DIAGNOSIS — I10 Essential (primary) hypertension: Secondary | ICD-10-CM | POA: Diagnosis not present

## 2017-09-06 DIAGNOSIS — R569 Unspecified convulsions: Secondary | ICD-10-CM

## 2017-09-06 DIAGNOSIS — Z86711 Personal history of pulmonary embolism: Secondary | ICD-10-CM | POA: Insufficient documentation

## 2017-09-06 DIAGNOSIS — D72829 Elevated white blood cell count, unspecified: Secondary | ICD-10-CM | POA: Diagnosis not present

## 2017-09-06 DIAGNOSIS — E1149 Type 2 diabetes mellitus with other diabetic neurological complication: Secondary | ICD-10-CM | POA: Diagnosis not present

## 2017-09-06 DIAGNOSIS — Z8673 Personal history of transient ischemic attack (TIA), and cerebral infarction without residual deficits: Secondary | ICD-10-CM | POA: Diagnosis not present

## 2017-09-06 DIAGNOSIS — I700 Atherosclerosis of aorta: Secondary | ICD-10-CM | POA: Insufficient documentation

## 2017-09-06 DIAGNOSIS — I609 Nontraumatic subarachnoid hemorrhage, unspecified: Secondary | ICD-10-CM

## 2017-09-06 DIAGNOSIS — Z431 Encounter for attention to gastrostomy: Secondary | ICD-10-CM

## 2017-09-06 DIAGNOSIS — K589 Irritable bowel syndrome without diarrhea: Secondary | ICD-10-CM | POA: Diagnosis not present

## 2017-09-06 DIAGNOSIS — K219 Gastro-esophageal reflux disease without esophagitis: Secondary | ICD-10-CM | POA: Insufficient documentation

## 2017-09-06 DIAGNOSIS — Z86718 Personal history of other venous thrombosis and embolism: Secondary | ICD-10-CM | POA: Diagnosis not present

## 2017-09-06 DIAGNOSIS — Z87891 Personal history of nicotine dependence: Secondary | ICD-10-CM | POA: Diagnosis not present

## 2017-09-06 DIAGNOSIS — I82409 Acute embolism and thrombosis of unspecified deep veins of unspecified lower extremity: Secondary | ICD-10-CM | POA: Diagnosis present

## 2017-09-06 DIAGNOSIS — T85528A Displacement of other gastrointestinal prosthetic devices, implants and grafts, initial encounter: Secondary | ICD-10-CM

## 2017-09-06 DIAGNOSIS — E785 Hyperlipidemia, unspecified: Secondary | ICD-10-CM | POA: Insufficient documentation

## 2017-09-06 DIAGNOSIS — I639 Cerebral infarction, unspecified: Secondary | ICD-10-CM | POA: Diagnosis present

## 2017-09-06 LAB — COMPREHENSIVE METABOLIC PANEL
ALT: 51 U/L — ABNORMAL HIGH (ref 0–44)
ANION GAP: 10 (ref 5–15)
AST: 39 U/L (ref 15–41)
Albumin: 4 g/dL (ref 3.5–5.0)
Alkaline Phosphatase: 95 U/L (ref 38–126)
BUN: 17 mg/dL (ref 6–20)
CHLORIDE: 106 mmol/L (ref 98–111)
CO2: 25 mmol/L (ref 22–32)
Calcium: 9.8 mg/dL (ref 8.9–10.3)
Creatinine, Ser: 0.94 mg/dL (ref 0.44–1.00)
Glucose, Bld: 101 mg/dL — ABNORMAL HIGH (ref 70–99)
Potassium: 4.2 mmol/L (ref 3.5–5.1)
SODIUM: 141 mmol/L (ref 135–145)
Total Bilirubin: 0.8 mg/dL (ref 0.3–1.2)
Total Protein: 7.9 g/dL (ref 6.5–8.1)

## 2017-09-06 LAB — URINALYSIS, ROUTINE W REFLEX MICROSCOPIC
BACTERIA UA: NONE SEEN
Bilirubin Urine: NEGATIVE
Glucose, UA: NEGATIVE mg/dL
Hgb urine dipstick: NEGATIVE
Ketones, ur: NEGATIVE mg/dL
NITRITE: NEGATIVE
PH: 7 (ref 5.0–8.0)
Protein, ur: NEGATIVE mg/dL
SPECIFIC GRAVITY, URINE: 1.02 (ref 1.005–1.030)

## 2017-09-06 LAB — CBC WITH DIFFERENTIAL/PLATELET
Basophils Absolute: 0 10*3/uL (ref 0.0–0.1)
Basophils Relative: 0 %
EOS ABS: 0.1 10*3/uL (ref 0.0–0.7)
EOS PCT: 1 %
HCT: 37.9 % (ref 36.0–46.0)
Hemoglobin: 12.2 g/dL (ref 12.0–15.0)
LYMPHS ABS: 2.1 10*3/uL (ref 0.7–4.0)
LYMPHS PCT: 19 %
MCH: 29.1 pg (ref 26.0–34.0)
MCHC: 32.2 g/dL (ref 30.0–36.0)
MCV: 90.5 fL (ref 78.0–100.0)
MONO ABS: 1 10*3/uL (ref 0.1–1.0)
Monocytes Relative: 9 %
Neutro Abs: 7.9 10*3/uL — ABNORMAL HIGH (ref 1.7–7.7)
Neutrophils Relative %: 71 %
PLATELETS: 273 10*3/uL (ref 150–400)
RBC: 4.19 MIL/uL (ref 3.87–5.11)
RDW: 15.5 % (ref 11.5–15.5)
WBC: 11.1 10*3/uL — AB (ref 4.0–10.5)

## 2017-09-06 LAB — I-STAT CG4 LACTIC ACID, ED
LACTIC ACID, VENOUS: 1.21 mmol/L (ref 0.5–1.9)
Lactic Acid, Venous: 1.1 mmol/L (ref 0.5–1.9)

## 2017-09-06 MED ORDER — SODIUM CHLORIDE 0.9 % IV SOLN
INTRAVENOUS | Status: DC | PRN
  Administered 2017-09-06 (×2): 500 mL via INTRAVENOUS

## 2017-09-06 MED ORDER — IOHEXOL 300 MG/ML  SOLN
30.0000 mL | Freq: Once | INTRAMUSCULAR | Status: AC | PRN
  Administered 2017-09-06: 30 mL via ORAL

## 2017-09-06 MED ORDER — SODIUM CHLORIDE 0.9% FLUSH
3.0000 mL | Freq: Two times a day (BID) | INTRAVENOUS | Status: DC
  Administered 2017-09-07 – 2017-09-08 (×3): 3 mL via INTRAVENOUS

## 2017-09-06 MED ORDER — ENOXAPARIN SODIUM 60 MG/0.6ML ~~LOC~~ SOLN
60.0000 mg | Freq: Two times a day (BID) | SUBCUTANEOUS | Status: DC
  Administered 2017-09-06 – 2017-09-08 (×5): 60 mg via SUBCUTANEOUS
  Filled 2017-09-06 (×6): qty 0.6

## 2017-09-06 MED ORDER — PROBIOTIC PO CAPS: 1.0000 | ORAL_CAPSULE | Freq: Two times a day (BID) | ORAL | Status: DC

## 2017-09-06 MED ORDER — TRAZODONE HCL 50 MG PO TABS: 50.0000 mg | ORAL_TABLET | Freq: Every evening | ORAL | Status: DC | PRN

## 2017-09-06 MED ORDER — ROSUVASTATIN CALCIUM 10 MG PO TABS
10.0000 mg | ORAL_TABLET | Freq: Every day | ORAL | Status: DC
  Administered 2017-09-06 – 2017-09-08 (×3): 10 mg via ORAL
  Filled 2017-09-06 (×3): qty 1

## 2017-09-06 MED ORDER — SODIUM CHLORIDE 0.9% FLUSH: 3.0000 mL | INTRAVENOUS | Status: DC | PRN

## 2017-09-06 MED ORDER — ACETAMINOPHEN 160 MG/5ML PO SOLN
650.0000 mg | Freq: Once | ORAL | Status: AC
  Administered 2017-09-06: 650 mg
  Filled 2017-09-06: qty 20.3

## 2017-09-06 MED ORDER — GABAPENTIN 250 MG/5ML PO SOLN
200.0000 mg | Freq: Two times a day (BID) | ORAL | Status: DC
  Administered 2017-09-06 – 2017-09-08 (×4): 200 mg
  Filled 2017-09-06 (×5): qty 4

## 2017-09-06 MED ORDER — PRO-STAT SUGAR FREE PO LIQD
30.0000 mL | Freq: Every day | ORAL | Status: DC
  Administered 2017-09-07 – 2017-09-08 (×2): 30 mL
  Filled 2017-09-06 (×2): qty 30

## 2017-09-06 MED ORDER — ACYCLOVIR 400 MG PO TABS: 400.0000 mg | ORAL_TABLET | Freq: Four times a day (QID) | ORAL | Status: DC

## 2017-09-06 MED ORDER — SERTRALINE HCL 50 MG PO TABS
100.0000 mg | ORAL_TABLET | Freq: Every day | ORAL | Status: DC
  Administered 2017-09-06 – 2017-09-08 (×3): 100 mg via ORAL
  Filled 2017-09-06 (×3): qty 2

## 2017-09-06 MED ORDER — ACETAMINOPHEN 325 MG PO TABS: 650.0000 mg | ORAL_TABLET | ORAL | Status: DC | PRN

## 2017-09-06 MED ORDER — SODIUM CHLORIDE 0.9 % IV SOLN: 250.0000 mL | INTRAVENOUS | Status: DC | PRN

## 2017-09-06 MED ORDER — IOPAMIDOL (ISOVUE-300) INJECTION 61%
INTRAVENOUS | Status: AC
  Filled 2017-09-06: qty 100

## 2017-09-06 MED ORDER — FLORANEX PO PACK
1.0000 g | PACK | Freq: Two times a day (BID) | ORAL | Status: DC
Start: 2017-09-06 — End: 2017-09-08
  Administered 2017-09-06 – 2017-09-08 (×4): 1 g
  Filled 2017-09-06 (×4): qty 1

## 2017-09-06 MED ORDER — ADULT MULTIVITAMIN LIQUID CH
15.0000 mL | Freq: Every day | ORAL | Status: DC
Start: 2017-09-06 — End: 2017-09-08
  Administered 2017-09-06 – 2017-09-08 (×3): 15 mL
  Filled 2017-09-06 (×3): qty 15

## 2017-09-06 MED ORDER — POLYETHYLENE GLYCOL 3350 17 G PO PACK
17.0000 g | PACK | Freq: Every day | ORAL | Status: DC
  Filled 2017-09-06 (×2): qty 1

## 2017-09-06 MED ORDER — IOPAMIDOL (ISOVUE-300) INJECTION 61%
100.0000 mL | Freq: Once | INTRAVENOUS | Status: AC | PRN
  Administered 2017-09-06: 100 mL via INTRAVENOUS

## 2017-09-06 MED ORDER — VANCOMYCIN HCL 10 G IV SOLR
1250.0000 mg | Freq: Once | INTRAVENOUS | Status: AC
  Administered 2017-09-06: 1250 mg via INTRAVENOUS
  Filled 2017-09-06: qty 1250

## 2017-09-06 MED ORDER — RANITIDINE HCL 150 MG/10ML PO SYRP
150.0000 mg | ORAL_SOLUTION | Freq: Every day | ORAL | Status: DC
Start: 2017-09-06 — End: 2017-09-08
  Administered 2017-09-06 – 2017-09-07 (×2): 150 mg
  Filled 2017-09-06 (×2): qty 10

## 2017-09-06 MED ORDER — JEVITY 1.5 CAL/FIBER PO LIQD
1000.0000 mL | ORAL | Status: DC
  Administered 2017-09-06 – 2017-09-07 (×2): 1000 mL
  Filled 2017-09-06 (×3): qty 1000

## 2017-09-06 MED ORDER — ONDANSETRON HCL 4 MG PO TABS: 4.0000 mg | ORAL_TABLET | Freq: Four times a day (QID) | ORAL | Status: DC | PRN

## 2017-09-06 MED ORDER — ONDANSETRON HCL 4 MG/2ML IJ SOLN: 4.0000 mg | Freq: Four times a day (QID) | INTRAMUSCULAR | Status: DC | PRN

## 2017-09-06 MED ORDER — METOPROLOL TARTRATE 25 MG PO TABS
25.0000 mg | ORAL_TABLET | Freq: Two times a day (BID) | ORAL | Status: DC
  Administered 2017-09-06 – 2017-09-08 (×5): 25 mg via ORAL
  Filled 2017-09-06 (×5): qty 1

## 2017-09-06 MED ORDER — ALBUTEROL SULFATE (2.5 MG/3ML) 0.083% IN NEBU: 2.5000 mg | INHALATION_SOLUTION | RESPIRATORY_TRACT | Status: DC | PRN

## 2017-09-06 MED ORDER — LEVETIRACETAM 100 MG/ML PO SOLN
500.0000 mg | Freq: Two times a day (BID) | ORAL | Status: DC
  Administered 2017-09-06 – 2017-09-08 (×5): 500 mg
  Filled 2017-09-06 (×5): qty 5

## 2017-09-06 MED ORDER — ACETAMINOPHEN 650 MG RE SUPP: 650.0000 mg | Freq: Four times a day (QID) | RECTAL | Status: DC | PRN

## 2017-09-06 MED ORDER — SODIUM CHLORIDE 0.9 % IV BOLUS
1000.0000 mL | Freq: Once | INTRAVENOUS | Status: AC
  Administered 2017-09-06: 1000 mL via INTRAVENOUS

## 2017-09-06 MED ORDER — CEFEPIME HCL 1 G IJ SOLR
1.0000 g | Freq: Once | INTRAMUSCULAR | Status: AC
  Administered 2017-09-06: 1 g via INTRAVENOUS
  Filled 2017-09-06: qty 1

## 2017-09-06 MED ORDER — HYDRALAZINE HCL 20 MG/ML IJ SOLN: 10.0000 mg | Freq: Four times a day (QID) | INTRAMUSCULAR | Status: DC | PRN

## 2017-09-06 MED ORDER — FREE WATER
200.0000 mL | Freq: Four times a day (QID) | Status: DC
  Administered 2017-09-06 – 2017-09-08 (×8): 200 mL

## 2017-09-06 MED ORDER — ONE-DAILY MULTI VITAMINS PO TABS: 1.0000 | ORAL_TABLET | Freq: Every day | ORAL | Status: DC

## 2017-09-06 MED ORDER — FAMOTIDINE 40 MG/5ML PO SUSR: 20.0000 mg | Freq: Every day | ORAL | Status: DC

## 2017-09-06 MED ORDER — TRAZODONE HCL 50 MG PO TABS
50.0000 mg | ORAL_TABLET | Freq: Every day | ORAL | Status: DC
  Administered 2017-09-06 – 2017-09-07 (×2): 50 mg
  Filled 2017-09-06 (×2): qty 1

## 2017-09-06 MED ORDER — JEVITY 1.5 CAL PO LIQD: 1000.0000 mL | ORAL | Status: DC

## 2017-09-06 MED ORDER — BETHANECHOL CHLORIDE 10 MG PO TABS
10.0000 mg | ORAL_TABLET | Freq: Three times a day (TID) | ORAL | Status: DC
  Administered 2017-09-06 – 2017-09-08 (×7): 10 mg
  Filled 2017-09-06 (×8): qty 1

## 2017-09-06 MED ORDER — POLYETHYLENE GLYCOL 3350 17 G PO PACK: 17.0000 g | PACK | Freq: Every day | ORAL | Status: DC | PRN

## 2017-09-06 MED ORDER — ACETAMINOPHEN 325 MG PO TABS: 650.0000 mg | ORAL_TABLET | Freq: Four times a day (QID) | ORAL | Status: DC | PRN

## 2017-09-06 NOTE — ED Triage Notes (Signed)
[facility] [place] [facility] [place] [address] with complaints of G tube accidentally removed-needs replacement.

## 2017-09-06 NOTE — Clinical Social Work Note (Addendum)
Clinical Social Work Assessment  Patient Details  Name: Rachel MinksStephanie N Norfleet MRN: [id number] Date of Birth: 09/20/1965  Date of referral:  09/06/17               Reason for consult:  Discharge Planning                Permission sought to share information with:  Case Manager, Facility Medical sales representativeContact Representative, Family Supports Permission granted to share information::  Yes, Verbal Permission Granted  Name::     Financial traderCarlton  Agency[facility]  Relationship::  Son (children)  Contact Information:     Housing/Transportation Living arrangements for the past 2 months:  Skilled Building surveyorursing Facility Source of Information:  Facility, Medical Team Patient Interpreter Needed:  None Criminal Activity/Legal Involvement Pertinent to Current Situation/Hospitalization:  No - Comment as needed Significant Relationships:  Adult Children, Merchandiser, retailCommunity Support Lives with:  Facility Resident Do you feel safe going back to the place where you live?  Yes Need for family participation in patient care:  Yes (Comment)  Care giving concerns:  No care giving concerns at the time of assessment.    Social Worker assessment / plan:  LCSW consulted for " admitted from facility.   [facility]   Patient admitted for replacement of G tube that was pulled out.   Patient is total care at facility.   PLAN: Patient will return to facility at dc.   Employment status:  Disabled (Comment on whether or not currently receiving Disability) Insurance information[place]  [place] Recommend[place]  Not assessed at this time Information / Referral to community resources:     Patient/Family's Response to care:  Patient is thankful for dc coordination.   Patient/Family's Understanding of and Emotional Response to Diagnosis, Current Treatment, and Prognosis:  Patient agreeable to return facility.   Emotional Assessment Appearance:    Attitude/Demeanor/Rapport:    Affect (typically  observed):  Calm Orientation:  Oriented to Self, Oriented to Place, Oriented to Situation Alcohol / Substance use:  Not Applicable Psych involvement (Current and /or in the community):  No (Comment)  Discharge Needs  Concerns to be addressed:  No discharge needs identified Readmission within the last 30 days:  Yes Current discharge risk:  None Barriers to Discharge:  Continued Medical Work up   BJ'sBernette Josef Tourigny, LCSW 09/06/2017, 3:10 PM

## 2017-09-06 NOTE — ED Notes (Signed)
Pt returned from xray and placed in a hall bed so that RN can observe better.

## 2017-09-06 NOTE — ED Notes (Signed)
ED TO INPATIENT HANDOFF REPORT  Name/Age/Gender Melton Krebs Hodgkins 52 y.o. female  Code Status    Code Status Orders  (From admission, onward)         Start     Ordered   09/06/17 0920  Full code  Continuous     09/06/17 0919        Code Status History    Date Active Date Inactive Code Status Order ID Comments User Context   05/06/2017 2342 05/13/2017 2141 Full Code [id number]  Vianne Bulls, MD ED   02/16/2017 0715 02/24/2017 2043 Partial Code [id number]  Scatliffe, Rise Paganini, MD Inpatient   02/15/2017 0345 02/16/2017 0715 Full Code [id number]  Rise Patience, MD ED   03/14/2015 1717 03/22/2015 1738 Full Code [id number]  Donita Brooks, NP ED   02/17/2015 2012 02/22/2015 1801 Full Code [id number]  Elgergawy, Silver Huguenin, MD Inpatient   02/07/2015 1016 02/14/2015 1643 Full Code [id number]  Chesley Mires, MD Inpatient   01/13/2015 1946 02/07/2015 1016 Full Code [id number]  Consuella Lose, MD Inpatient   01/13/2015 1212 01/13/2015 1946 Full Code [id number]  Whiteheart, Cristal Ford, NP ED      Home/SNF/Other Home  Chief Complaint Pulled out G tube  Level of Care/Admitting Diagnosis ED Disposition    ED Disposition Condition [place]: [facility] [[id number]]  Level of Care: Med-Surg [16]  Diagnosis: Fever [093235]  Admitting Physician: Morrison Old  Attending Physician: Morrison Old  Bed request comments: med  PT Class (Do Not Modify): Observation [104]  PT Acc Code (Do Not Modify): Observation [10022]       Medical History Past Medical History:  Diagnosis Date  . Acute pulmonary embolism ([place]) 02/19/2015  . Acute respiratory failure ([place])   . Diabetes mellitus without complication (HCC)    Type 2, W/o complications  . DVT (deep venous thrombosis) ([place]) 04/05/2015  . Dysphagia   . Epilepsy ([place])   . GERD (gastroesophageal reflux disease)   . Hyperlipidemia   . Hypertension   . IBS (irritable bowel syndrome)    . Nontraumatic subarachnoid hemorrhage ([place])   . SAH (subarachnoid hemorrhage) ([place])   . Urinary retention     Allergies No Known Allergies  IV Location/Drains/Wounds Patient Lines/Drains/Airways Status   Active Line/Drains/Airways    Name:   Placement date:   Placement time:   Site:   Days:   Peripheral IV 01/06/17 Right;Lateral Forearm   01/06/17    2152    Forearm   243   Peripheral IV 05/10/17 Left;Posterior Hand   05/10/17    1024    Hand   119   Peripheral IV 09/06/17 Left Hand   09/06/17    0530    Hand   less than 1   PICC Single Lumen 05/13/17 PICC Left Brachial 45 cm 1 cm   05/13/17    1420    Brachial   116   Gastrostomy/Enterostomy Gastrostomy 16 Fr. LUQ   01/07/17    1131    LUQ   242   Rectal Tube/Pouch   02/20/17    0504    -   198   External Urinary Catheter   05/12/17    1752    -   117   Pressure Ulcer 03/15/15 Unstageable - Full thickness tissue loss in which the base of the ulcer is covered by slough (yellow, tan, gray, green or brown) and/or eschar (tan, brown or black)  in the wound bed. not opened unable to stage   03/15/15    0100     906   Pressure Ulcer 03/15/15 Stage II -  Partial thickness loss of dermis presenting as a shallow open ulcer with a red, pink wound bed without slough.   03/15/15    0100     906   Pressure Injury 02/16/17 Stage II -  Partial thickness loss of dermis presenting as a shallow open ulcer with a red, pink wound bed without slough.   02/16/17    -     202   Pressure Injury 05/08/17 Stage II -  Partial thickness loss of dermis presenting as a shallow open ulcer with a red, pink wound bed without slough.   05/08/17    1745     121          Labs/Imaging Results for orders placed or performed during the hospital encounter of 09/06/17 (from the past 48 hour(s))  Urinalysis, Routine w reflex microscopic     Status: Abnormal   Collection Time: 09/06/17  4:52 AM  Result Value Ref Range   Color, Urine YELLOW YELLOW   APPearance CLEAR CLEAR    Specific Gravity, Urine 1.020 1.005 - 1.030   pH 7.0 5.0 - 8.0   Glucose, UA NEGATIVE NEGATIVE mg/dL   Hgb urine dipstick NEGATIVE NEGATIVE   Bilirubin Urine NEGATIVE NEGATIVE   Ketones, ur NEGATIVE NEGATIVE mg/dL   Protein, ur NEGATIVE NEGATIVE mg/dL   Nitrite NEGATIVE NEGATIVE   Leukocytes, UA SMALL (A) NEGATIVE   RBC / HPF 0-5 0 - 5 RBC/hpf   WBC, UA 0-5 0 - 5 WBC/hpf   Bacteria, UA NONE SEEN NONE SEEN   Squamous Epithelial / LPF 0-5 0 - 5   Mucus PRESENT     Comment: Performed at [facility], [place] [address] [place], [place] [id number]  CBC with Differential/Platelet     Status: Abnormal   Collection Time: 09/06/17  5:29 AM  Result Value Ref Range   WBC 11.1 (H) 4.0 - 10.5 K/uL   RBC 4.19 3.87 - 5.11 MIL/uL   Hemoglobin 12.2 12.0 - 15.0 g/dL   HCT 37.9 36.0 - 46.0 %   MCV 90.5 78.0 - 100.0 fL   MCH 29.1 26.0 - 34.0 pg   MCHC 32.2 30.0 - 36.0 g/dL   RDW 15.5 11.5 - 15.5 %   Platelets 273 150 - 400 K/uL   Neutrophils Relative % 71 %   Neutro Abs 7.9 (H) 1.7 - 7.7 K/uL   Lymphocytes Relative 19 %   Lymphs Abs 2.1 0.7 - 4.0 K/uL   Monocytes Relative 9 %   Monocytes Absolute 1.0 0.1 - 1.0 K/uL   Eosinophils Relative 1 %   Eosinophils Absolute 0.1 0.0 - 0.7 K/uL   Basophils Relative 0 %   Basophils Absolute 0.0 0.0 - 0.1 K/uL    Comment: Performed at [facility], [place] [address], [place], [place] [address]  Comprehensive metabolic panel     Status: Abnormal   Collection Time: 09/06/17  5:29 AM  Result Value Ref Range   Sodium 141 135 - 145 mmol/L   Potassium 4.2 3.5 - 5.1 mmol/L   Chloride 106 98 - 111 mmol/L   CO2 25 22 - 32 mmol/L   Glucose, Bld 101 (H) 70 - 99 mg/dL   BUN 17 6 - 20 mg/dL   Creatinine, Ser 0.94 0.44 - 1.00 mg/dL  Calcium 9.8 8.9 - 10.3 mg/dL   Total Protein 7.9 6.5 - 8.1 g/dL   Albumin 4.0 3.5 - 5.0 g/dL   AST 39 15 - 41 U/L   ALT 51 (H) 0 - 44 U/L   Alkaline Phosphatase 95 38 - 126 U/L   Total Bilirubin  0.8 0.3 - 1.2 mg/dL   GFR calc non Af Amer >60 >60 mL/min   GFR calc Af Amer >60 >60 mL/min    Comment: (NOTE) The eGFR has been calculated using the CKD EPI equation. This calculation has not been validated in all clinical situations. eGFR's persistently <60 mL/min signify possible Chronic Kidney Disease.    Anion gap 10 5 - 15    Comment: Performed at [facility], [place] [address] [place],  [address]  I-Stat CG4 Lactic Acid, ED     Status: None   Collection Time: 09/06/17  5:44 AM  Result Value Ref Range   Lactic Acid, Venous 1.21 0.5 - 1.9 mmol/L  I-Stat CG4 Lactic Acid, ED     Status: None   Collection Time: 09/06/17  7:50 AM  Result Value Ref Range   Lactic Acid, Venous 1.10 0.5 - 1.9 mmol/L   Dg Chest 2 View  Result Date: 09/06/2017 CLINICAL DATA:  Acute onset of fever. EXAM: CHEST - 2 VIEW COMPARISON:  Chest radiograph and CTA of the chest performed 05/06/2017 FINDINGS: The lungs are well-aerated and clear. There is no evidence of focal opacification, pleural effusion or pneumothorax. The heart is normal in size; the mediastinal contour is within normal limits. No acute osseous abnormalities are seen. IMPRESSION: No acute cardiopulmonary process seen. Electronically Signed   By: Garald Balding M.D.   On: 09/06/2017 05:12   Dg Abdomen 1 View  Result Date: 09/06/2017 CLINICAL DATA:  G-tube placement. EXAM: ABDOMEN - 1 VIEW COMPARISON:  Abdominal radiograph performed 05/08/2017 FINDINGS: Contrast injected into the patient's G-tube is noted filling the antrum of the stomach and proximal duodenum as expected. The visualized bowel gas pattern is grossly unremarkable. No free intra-abdominal air is seen, though evaluation for free air is limited on a single supine view. There is elevation of the right hemidiaphragm. No acute osseous abnormalities are seen. IMPRESSION: Contrast injected into the G-tube is noted filling the antrum of the stomach and proximal duodenum  as expected. Electronically Signed   By: Garald Balding M.D.   On: 09/06/2017 03:42   Ct Abdomen Pelvis W Contrast  Result Date: 09/06/2017 CLINICAL DATA:  Abdominal pain. Fever. Pulled gastrostomy tube out, subsequently replaced in the emergency department. EXAM: CT ABDOMEN AND PELVIS WITH CONTRAST TECHNIQUE: Multidetector CT imaging of the abdomen and pelvis was performed using the standard protocol following bolus administration of intravenous contrast. CONTRAST:  151m ISOVUE-300 IOPAMIDOL (ISOVUE-300) INJECTION 61% COMPARISON:  05/07/2017 FINDINGS: Lower chest: Dependent subsegmental atelectasis in the lung bases. No pleural effusion. Hepatobiliary: 8 mm hypodensity in the right hepatic lobe, unchanged in size and too small to fully characterize. Unremarkable gallbladder. No biliary dilatation. Pancreas: Unremarkable. Spleen: Unchanged focal posterior splenic calcification. Adrenals/Urinary Tract: Unremarkable adrenal glands. No evidence of renal mass, calculi, or hydronephrosis. Unremarkable bladder. Stomach/Bowel: A gastrostomy tube is present in the body of the stomach. There is moderate soft tissue thickening/stranding and small volume unorganized fluid and/or hematoma adjacent to the tube in the anterior abdominal wall. Oral contrast is present in loops of small and large bowel to the level the rectum. There is swirling of the mesenteric vessels in  the central pelvis without evidence of bowel obstruction. The appendix is not clearly identified, however no inflammatory changes are present in the right lower quadrant. Vascular/Lymphatic: Abdominal aortic atherosclerosis without aneurysm. No enlarged lymph nodes. Reproductive: Status post hysterectomy. No adnexal masses. Other: No ascites or pneumoperitoneum. Fat containing ventral epigastric hernia. Foci of soft tissue density and gas in the left lower quadrant abdominal wall, likely related to subcutaneous medication injection. Musculoskeletal: No acute  osseous abnormality or suspicious osseous lesion. IMPRESSION: 1. Gastrostomy tube in place in the stomach. Soft tissue stranding and small volume fluid/hematoma around the tube in the abdominal wall. No evidence of abscess. 2. No other evidence of acute abnormality in the abdomen or pelvis. 3.  Aortic Atherosclerosis (ICD10-I70.0). Electronically Signed   By: Logan Bores M.D.   On: 09/06/2017 08:09    Pending Labs Unresulted Labs (From admission, onward)    Start     Ordered   09/07/17 6438  Basic metabolic panel  Tomorrow morning,   R     09/06/17 0919   09/07/17 0500  CBC  Tomorrow morning,   R     09/06/17 0919   09/06/17 0422  Blood culture (routine x 2)  BLOOD CULTURE X 2,   STAT     09/06/17 0421   09/06/17 0422  Urine Culture  Once,   STAT     09/06/17 0421          Vitals/Pain Today's Vitals   09/06/17 0115 09/06/17 0410 09/06/17 0426 09/06/17 0700  BP: 114/85 (!) 139/93  130/82  Pulse: 100 (!) 115    Resp: 20 17  16   Temp: 98.8 F (37.1 C) (!) 100.4 F (38 C) (!) 101 F (38.3 C)   TempSrc: Oral Oral Rectal   SpO2: 100% 100%  100%  Weight:      Height:        Isolation Precautions No active isolations  Medications Medications  iopamidol (ISOVUE-300) 61 % injection (has no administration in time range)  0.9 %  sodium chloride infusion (500 mLs Intravenous New Bag/Given 09/06/17 0737)  levETIRAcetam (KEPPRA) 100 MG/ML solution 500 mg (has no administration in time range)  bethanechol (URECHOLINE) tablet 10 mg (has no administration in time range)  acetaminophen (TYLENOL) tablet 650 mg (has no administration in time range)  gabapentin (NEURONTIN) capsule 200 mg (has no administration in time range)  free water 200 mL (has no administration in time range)  famotidine (PEPCID) 40 MG/5ML suspension 20 mg (has no administration in time range)  traZODone (DESYREL) tablet 50 mg (has no administration in time range)  feeding supplement (JEVITY 1.5 CAL) liquid 1,000 mL  (has no administration in time range)  metoprolol tartrate (LOPRESSOR) tablet 25 mg (has no administration in time range)  multivitamin tablet 1 tablet (has no administration in time range)  polyethylene glycol (MIRALAX / GLYCOLAX) packet 17 g (has no administration in time range)  Probiotic CAPS 1 capsule (has no administration in time range)  sertraline (ZOLOFT) tablet 100 mg (has no administration in time range)  enoxaparin (LOVENOX) injection 60 mg (has no administration in time range)  rosuvastatin (CRESTOR) tablet 10 mg (has no administration in time range)  acyclovir (ZOVIRAX) tablet 400 mg (has no administration in time range)  sodium chloride flush (NS) 0.9 % injection 3 mL (has no administration in time range)  sodium chloride flush (NS) 0.9 % injection 3 mL (has no administration in time range)  0.9 %  sodium chloride infusion (has  no administration in time range)  acetaminophen (TYLENOL) tablet 650 mg (has no administration in time range)    Or  acetaminophen (TYLENOL) suppository 650 mg (has no administration in time range)  traZODone (DESYREL) tablet 50 mg (has no administration in time range)  polyethylene glycol (MIRALAX / GLYCOLAX) packet 17 g (has no administration in time range)  ondansetron (ZOFRAN) tablet 4 mg (has no administration in time range)    Or  ondansetron (ZOFRAN) injection 4 mg (has no administration in time range)  albuterol (PROVENTIL) (2.5 MG/3ML) 0.083% nebulizer solution 2.5 mg (has no administration in time range)  iohexol (OMNIPAQUE) 300 MG/ML solution 30 mL (30 mLs Oral Contrast Given 09/06/17 0332)  sodium chloride 0.9 % bolus 1,000 mL (0 mLs Intravenous Stopped 09/06/17 0646)  acetaminophen (TYLENOL) solution 650 mg (650 mg Per Tube Given 09/06/17 0616)  ceFEPIme (MAXIPIME) 1 g in sodium chloride 0.9 % 100 mL IVPB (0 g Intravenous Stopped 09/06/17 0735)  vancomycin (VANCOCIN) 1,250 mg in sodium chloride 0.9 % 250 mL IVPB (1,250 mg Intravenous New Bag/Given  09/06/17 0737)  iopamidol (ISOVUE-300) 61 % injection 100 mL (100 mLs Intravenous Contrast Given 09/06/17 0711)    Mobility non-ambulatory'

## 2017-09-06 NOTE — ED Notes (Signed)
Patient transported to CT 

## 2017-09-06 NOTE — ED Notes (Signed)
Bed: WA12 Expected date:  Expected time:  Means of arrival:  Comments: 

## 2017-09-06 NOTE — Progress Notes (Signed)
Initial Nutrition Assessment  DOCUMENTATION CODES:   Not applicable  INTERVENTION:   -Jevity 1.5 @ 42 ml/hr (1008 ml) via PEG -Free water flushes 200 ml Q6 hours  NUTRITION DIAGNOSIS:   Inadequate oral intake related to dysphagia, inability to eat as evidenced by NPO status  GOAL:   Patient will meet greater than or equal to 90% of their needs  MONITOR:   Weight trends, Labs, I & O's, TF tolerance  REASON FOR ASSESSMENT:   Other (Comment)(TF ordered)    ASSESSMENT:   Patient with PMH significant for DM, dysphagia, HLD, HTN, SAH, non verbal, bed-bound, and G-tube dependent. Presents this admission with pulled out G-tube.    Per nursing G-tube has been replaced and the plan is to start TF this afternoon. Unable to obtain any history from pt and no family present at bedside. Pt looks to be on a home regimen of Jevity 1.5 @ 42 ml/hr with free water flushes of 200 ml every 6 hours ([facility]). This provides 1512 kcal, 64 grams protein, and 766 ml free water (1566 ml with flushes).   Recent weight looks to be consistent at 129 lb. Do not suspect recent wt loss. Nutrition-Focused physical exam completed. Bilateral lower extremities show severe muscle depletion which is to be expected with immobility.    Medications reviewed and include: liquid MVI Labs reviewed.   NUTRITION - FOCUSED PHYSICAL EXAM:    Most Recent Value  Orbital Region  No depletion  Upper Arm Region  No depletion  Thoracic and Lumbar Region  Unable to assess  Buccal Region  No depletion  Temple Region  No depletion  Clavicle Bone Region  No depletion  Clavicle and Acromion Bone Region  No depletion  Scapular Bone Region  Unable to assess  Dorsal Hand  No depletion  Patellar Region  Severe depletion  Anterior Thigh Region  Severe depletion  Posterior Calf Region  Severe depletion  Edema (RD Assessment)  None  Hair  Reviewed  Eyes  Reviewed  Mouth  Reviewed  Skin  Reviewed  Nails   Reviewed     Diet Order:   Diet Order            Diet NPO time specified  Diet effective now              EDUCATION NEEDS:   Not appropriate for education at this time  Skin:  Skin Assessment: Reviewed RN Assessment  Last BM:  PTA  Height:   Ht Readings from Last 1 Encounters:  09/06/17 5\' 1"  (1.549 m)    Weight:   Wt Readings from Last 1 Encounters:  09/06/17 58.1 kg    Ideal Body Weight:  47.7 kg  BMI:  Body mass index is 24.2 kg/m.  Estimated Nutritional Needs:   Kcal:  1350-1550 kcal  Protein:  65-75 grams   Fluid:  >/= 1.3 L/day    Vanessa Kickarly Isam Unrein RD, LDN Clinical Nutrition Pager # - [phone]

## 2017-09-06 NOTE — ED Notes (Addendum)
Attempted to start IV and draw blood cultures x2 unsuccessful. Charge RN notified. AConsulting civil engineerlso [facility] and further testing to be done.

## 2017-09-06 NOTE — H&P (Signed)
Patient Demographics:    Rachel Vang, is a 52 y.o. Vang  MRN: [id number]   DOB - 10/31/1965  Admit Date - 09/06/2017  Outpatient Primary MD for the patient is Kirt Boysarter, Monica, DO   Assessment & Plan:    Principal Problem:   Fever Active Problems:   Subarachnoid hemorrhage (HCC)   Dysphagia   Seizures (HCC)   DVT (deep venous thrombosis) (HCC)   Type II diabetes mellitus with neurological manifestations (HCC)   Essential hypertension, benign   CVA (cerebrovascular accident) (HCC)    1)Fever--- etiology unclear, patient had a temp up to 101 initially, afebrile at this time CT abdomen and pelvis without evidence of acute infection, CXR without acute pulmonary findings, UA is not suggestive of UTI, white count is 11.1, lactic acid is not elevated, patient received cefepime and vancomycin in the ED, hold off on further antibiotics for now pending further clinical and culture data.   2)Dysphagia/FEN--restart tube feeding Jevity 1.5 at 42 mL an hour with 200 mL flushes every 4 hours  3)H/o prior subarachnoid hemorrhage--- stable at this time continue Keppra for seizure prophylaxis, and Neurontin  4)History of DVT--- stable, no new concerns, continue Lovenox 60 mg every 12 hours  5)HTN-stable, continue metoprolol 25 mg twice daily,  may use IV Hydralazine 10 mg  Every 4 hours Prn for systolic blood pressure over 160 mmhg  6)Social/Ethics--plan of care discussed with patient's son Les PouCarlton at [phone]  7)Disposition-possible discharge [facility] in 1 to 2 days if afebrile and cultures are without growth, as long as patient remains clinically stable    With History of - Reviewed by me  Past Medical History:  Diagnosis Date  . Acute pulmonary embolism (HCC) 02/19/2015  . Acute  respiratory failure (HCC)   . Diabetes mellitus without complication (HCC)    Type 2, W/o complications  . DVT (deep venous thrombosis) (HCC) 04/05/2015  . Dysphagia   . Epilepsy (HCC)   . GERD (gastroesophageal reflux disease)   . Hyperlipidemia   . Hypertension   . IBS (irritable bowel syndrome)   . Nontraumatic subarachnoid hemorrhage (HCC)   . SAH (subarachnoid hemorrhage) (HCC)   . Urinary retention       Past Surgical History:  Procedure Laterality Date  . ABDOMINAL SURGERY    . ANEURYSM COILING    . COLONOSCOPY N/A 02/20/2015   Procedure: COLONOSCOPY;  Surgeon: Iva Booparl E Gessner, MD;  [facility];  Service: Endoscopy;  Laterality: N/A;  . ESOPHAGOGASTRODUODENOSCOPY (EGD) WITH PROPOFOL N/A 02/02/2015   Procedure: ESOPHAGOGASTRODUODENOSCOPY (EGD) WITH PROPOFOL;  Surgeon: Jimmye NormanJames Wyatt, MD;  Location: [facility];  Service: General;  Laterality: N/A;  . IR GENERIC HISTORICAL  08/26/2015   IR GASTRIC TUBE PERC CHG W/O IMG GUIDE 08/26/2015 Irish LackGlenn Yamagata, MD [facility]-INTERV RAD  . IR GENERIC HISTORICAL  09/14/2015   IR REPLC GASTRO/COLONIC TUBE PERCUT W/FLUORO 09/14/2015 Darrell K Allred, PA-C [facility]-INTERV RAD  . IR  REPLACE G-TUBE SIMPLE WO FLUORO  06/08/2016  . IR REPLACE G-TUBE SIMPLE WO FLUORO  10/24/2016  . IR REPLACE G-TUBE SIMPLE WO FLUORO  01/07/2017  . PEG PLACEMENT N/A 02/02/2015   Procedure: PERCUTANEOUS ENDOSCOPIC GASTROSTOMY (PEG) PLACEMENT;  Surgeon: Jimmye Norman, MD;  Location: [facility] ENDOSCOPY;  Service: General;  Laterality: N/A;  . RADIOLOGY WITH ANESTHESIA N/A 01/13/2015   Procedure: RADIOLOGY WITH ANESTHESIA;  Surgeon: Lisbeth Renshaw, MD;  Location: [facility] OR;  Service: Radiology;  Laterality: N/A;  . TRACHEOSTOMY        Chief Complaint  Patient presents with  . Pulled out G tube      HPI:    Rachel Vang with past medical history relevant for history of subarachnoid hemorrhage prior strokes, with resulting dysphasia and aphasia, and status post  PEG tube,, history of prior DVT.    Due to stroke related aphasia patient unable to give any history, additional history obtained by phone from patient's son [name] at [phone]  Patient is a resident of [facility] facility brought into the ED after accidentally pulling out PEG tube, placed in the ED and the plan was to discharge patient back to facility however patient spiked a temp around 101.   In Ed  CT abdomen and pelvis failed to demonstrate acute infection, chest x-ray without acute cardiopulmonary findings, UA was not suggestive of UTI, mild leukocytosis of 11,000 noted, and blood cultures were obtained patient did receive Vanco and cefepime from ED provider and the hospitalist was called to evaluate for possible admission    Review of systems:    In addition to the HPI above,   A full Review of  Systems was done, all other systems reviewed are negative except as noted above in HPI , .    Social History:  Reviewed by me    Social History   Tobacco Use  . Smoking status: Former Games developer  . Smokeless tobacco: Never Used  Substance Use Topics  . Alcohol use: No       Family History :  Reviewed by me    Family History  Problem Relation Age of Onset  . Hypertension Other      Home Medications:   Prior to Admission medications   Medication Sig Start Date End Date Taking? Authorizing Provider  acetaminophen (TYLENOL) 325 MG tablet Place 650 mg into feeding tube every 4 (four) hours as needed for mild pain or fever.    Yes [provider]  Amino Acids-Protein Hydrolys (FEEDING SUPPLEMENT, PRO-STAT SUGAR FREE 64,) LIQD Place 30 mLs into feeding tube daily.   Yes [provider]  bethanechol (URECHOLINE) 10 MG tablet Place 10 mg into feeding tube 3 (three) times daily.    Yes [provider]  enoxaparin (LOVENOX) 60 MG/0.6ML injection Inject 60 mg into the skin every 12 (twelve) hours.   Yes [provider]  gabapentin  (NEURONTIN) 100 MG capsule Take 200 mg by mouth 2 (two) times daily. Per G-Tube HOLD IF LETHARGIC 04/04/17  Yes Ellwood Sayers, MD  levETIRAcetam (KEPPRA) 100 MG/ML solution Place 5 mLs (500 mg total) into feeding tube 2 (two) times daily. 02/11/15  Yes Leroy Sea, MD  metoprolol tartrate (LOPRESSOR) 25 MG tablet Take 25 mg by mouth 2 (two) times daily. Hold if SBP < 100 apical pulse   Yes [provider]  Multiple Vitamin (MULTIVITAMIN) tablet Place 1 tablet into feeding tube daily.    Yes [provider]  Nutritional Supplements (FEEDING SUPPLEMENT, JEVITY 1.5 CAL,) LIQD Place 42 mLs into feeding tube every hour. Continue with 42 cc per hour and flush with 200 cc every 4 hours    Yes [provider]  polyethylene glycol (MIRALAX / GLYCOLAX) packet Place 17 g into feeding tube daily.    Yes [provider]  Probiotic CAPS Place 1 capsule into feeding tube 2 (two) times daily.   Yes [provider]  ranitidine (ZANTAC) 150 MG tablet Place 150 mg into feeding tube daily.  04/25/17  Yes [provider]  rosuvastatin (CRESTOR) 10 MG tablet Take 10 mg by mouth daily.   Yes [provider]  sertraline (ZOLOFT) 100 MG tablet Take 100 mg by mouth daily.  07/12/17  Yes [provider]  traZODone (DESYREL) 50 MG tablet Place 50 mg into feeding tube at bedtime.   Yes [provider]  Water For Irrigation, Sterile (FREE WATER) SOLN Place 200 mLs into feeding tube 4 (four) times daily. 02/22/17  Yes Calvert Cantor, MD  acyclovir (ZOVIRAX) 400 MG tablet Please take medications 4 times a day through PEG tube. Patient not taking: Reported on 09/06/2017 08/09/17   Zadie Rhine, MD  nystatin (MYCOSTATIN/NYSTOP) powder Apply topically 3 (three) times daily. For one week Patient not taking: Reported on 09/06/2017 08/09/17   Zadie Rhine, MD     Allergies:    No Known Allergies   Physical Exam:   Vitals  Blood pressure  104/75, pulse 86, temperature 98.7 F (37.1 C), temperature source Oral, resp. rate 20, height 5\' 1"  (1.549 m), weight 58.1 kg, SpO2 99 %.  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 knee she is unable to fully extend either of her legs,   LE--- intact peripheral pulses  Skin - warm, dry     Data Review:    CBC Recent Labs  Lab 09/06/17 0529  WBC 11.1*  HGB 12.2  HCT 37.9  PLT 273  MCV 90.5  MCH 29.1  MCHC 32.2  RDW 15.5  LYMPHSABS 2.1  MONOABS 1.0  EOSABS 0.1  BASOSABS 0.0   ------------------------------------------------------------------------------------------------------------------  Chemistries  Recent Labs  Lab 09/06/17 0529  NA 141  K 4.2  CL 106  CO2 25  GLUCOSE 101*  BUN 17  CREATININE 0.94  CALCIUM 9.8  AST 39  ALT 51*  ALKPHOS 95  BILITOT 0.8   ------------------------------------------------------------------------------------------------------------------ estimated creatinine clearance is 56.1 mL/min (by C-G formula based on SCr of 0.94 mg/dL). ------------------------------------------------------------------------------------------------------------------ No results for input(s): TSH, T4TOTAL, T3FREE, THYROIDAB in the last 72 hours.  Invalid input(s): FREET3   Coagulation profile No results for input(s): INR, PROTIME in the last 168  hours. ------------------------------------------------------------------------------------------------------------------- No results for input(s): DDIMER in the last 72 hours. -------------------------------------------------------------------------------------------------------------------  Cardiac Enzymes No results for input(s): CKMB, TROPONINI, MYOGLOBIN in the last 168 hours.  Invalid input(s): CK ------------------------------------------------------------------------------------------------------------------    Component Value Date/Time   BNP 53.0 05/06/2017 2023     ---------------------------------------------------------------------------------------------------------------  Urinalysis  Component Value Date/Time   COLORURINE YELLOW 09/06/2017 0452   APPEARANCEUR CLEAR 09/06/2017 0452   LABSPEC 1.020 09/06/2017 0452   PHURINE 7.0 09/06/2017 0452   GLUCOSEU NEGATIVE 09/06/2017 0452   HGBUR NEGATIVE 09/06/2017 0452   BILIRUBINUR NEGATIVE 09/06/2017 0452   KETONESUR NEGATIVE 09/06/2017 0452   PROTEINUR NEGATIVE 09/06/2017 0452   NITRITE NEGATIVE 09/06/2017 0452   LEUKOCYTESUR SMALL (A) 09/06/2017 0452    ----------------------------------------------------------------------------------------------------------------   Imaging Results:    Dg Chest 2 View  Result Date: 09/06/2017 CLINICAL DATA:  Acute onset of fever. EXAM: CHEST - 2 VIEW COMPARISON:  Chest radiograph and CTA of the chest performed 05/06/2017 FINDINGS: The lungs are well-aerated and clear. There is no evidence of focal opacification, pleural effusion or pneumothorax. The heart is normal in size; the mediastinal contour is within normal limits. No acute osseous abnormalities are seen. IMPRESSION: No acute cardiopulmonary process seen. Electronically Signed   By: Roanna Raider M.D.   On: 09/06/2017 05:12   Dg Abdomen 1 View  Result Date: 09/06/2017 CLINICAL DATA:  G-tube placement. EXAM: ABDOMEN - 1 VIEW  COMPARISON:  Abdominal radiograph performed 05/08/2017 FINDINGS: Contrast injected into the patient's G-tube is noted filling the antrum of the stomach and proximal duodenum as expected. The visualized bowel gas pattern is grossly unremarkable. No free intra-abdominal air is seen, though evaluation for free air is limited on a single supine view. There is elevation of the right hemidiaphragm. No acute osseous abnormalities are seen. IMPRESSION: Contrast injected into the G-tube is noted filling the antrum of the stomach and proximal duodenum as expected. Electronically Signed   By: Roanna Raider M.D.   On: 09/06/2017 03:42   Ct Abdomen Pelvis W Contrast  Result Date: 09/06/2017 CLINICAL DATA:  Abdominal pain. Fever. Pulled gastrostomy tube out, subsequently replaced in the emergency department. EXAM: CT ABDOMEN AND PELVIS WITH CONTRAST TECHNIQUE: Multidetector CT imaging of the abdomen and pelvis was performed using the standard protocol following bolus administration of intravenous contrast. CONTRAST:  ISOVUE-300 IOPAMIDOL (ISOVUE-300) INJECTION 61% COMPARISON:  05/07/2017 FINDINGS: Lower chest: Dependent subsegmental atelectasis in the lung bases. No pleural effusion. Hepatobiliary: 8 mm hypodensity in the right hepatic lobe, unchanged in size and too small to fully characterize. Unremarkable gallbladder. No biliary dilatation. Pancreas: Unremarkable. Spleen: Unchanged focal posterior splenic calcification. Adrenals/Urinary Tract: Unremarkable adrenal glands. No evidence of renal mass, calculi, or hydronephrosis. Unremarkable bladder. Stomach/Bowel: A gastrostomy tube is present in the body of the stomach. There is moderate soft tissue thickening/stranding and small volume unorganized fluid and/or hematoma adjacent to the tube in the anterior abdominal wall. Oral contrast is present in loops of small and large bowel to the level the rectum. There is swirling of the mesenteric vessels in the central  pelvis without evidence of bowel obstruction. The appendix is not clearly identified, however no inflammatory changes are present in the right lower quadrant. Vascular/Lymphatic: Abdominal aortic atherosclerosis without aneurysm. No enlarged lymph nodes. Reproductive: Status post hysterectomy. No adnexal masses. Other: No ascites or pneumoperitoneum. Fat containing ventral epigastric hernia. Foci of soft tissue density and gas in the left lower quadrant abdominal wall, likely related to subcutaneous medication injection. Musculoskeletal: No acute osseous abnormality or suspicious osseous lesion. IMPRESSION: 1. Gastrostomy tube in place in the stomach. Soft tissue stranding and small volume fluid/hematoma around the tube in the abdominal wall. No evidence of abscess. 2. No other evidence of acute abnormality in the abdomen or pelvis. 3.  Aortic Atherosclerosis (ICD10-I70.0). Electronically Signed  By: Sebastian Ache M.D.   On: 09/06/2017 08:09    Radiological Exams on Admission: Dg Chest 2 View  Result Date: 09/06/2017 CLINICAL DATA:  Acute onset of fever. EXAM: CHEST - 2 VIEW COMPARISON:  Chest radiograph and CTA of the chest performed 05/06/2017 FINDINGS: The lungs are well-aerated and clear. There is no evidence of focal opacification, pleural effusion or pneumothorax. The heart is normal in size; the mediastinal contour is within normal limits. No acute osseous abnormalities are seen. IMPRESSION: No acute cardiopulmonary process seen. Electronically Signed   By: Roanna Raider M.D.   On: 09/06/2017 05:12   Dg Abdomen 1 View  Result Date: 09/06/2017 CLINICAL DATA:  G-tube placement. EXAM: ABDOMEN - 1 VIEW COMPARISON:  Abdominal radiograph performed 05/08/2017 FINDINGS: Contrast injected into the patient's G-tube is noted filling the antrum of the stomach and proximal duodenum as expected. The visualized bowel gas pattern is grossly unremarkable. No free intra-abdominal air is seen, though evaluation for  free air is limited on a single supine view. There is elevation of the right hemidiaphragm. No acute osseous abnormalities are seen. IMPRESSION: Contrast injected into the G-tube is noted filling the antrum of the stomach and proximal duodenum as expected. Electronically Signed   By: Roanna Raider M.D.   On: 09/06/2017 03:42   Ct Abdomen Pelvis W Contrast  Result Date: 09/06/2017 CLINICAL DATA:  Abdominal pain. Fever. Pulled gastrostomy tube out, subsequently replaced in the emergency department. EXAM: CT ABDOMEN AND PELVIS WITH CONTRAST TECHNIQUE: Multidetector CT imaging of the abdomen and pelvis was performed using the standard protocol following bolus administration of intravenous contrast. CONTRAST:  ISOVUE-300 IOPAMIDOL (ISOVUE-300) INJECTION 61% COMPARISON:  05/07/2017 FINDINGS: Lower chest: Dependent subsegmental atelectasis in the lung bases. No pleural effusion. Hepatobiliary: 8 mm hypodensity in the right hepatic lobe, unchanged in size and too small to fully characterize. Unremarkable gallbladder. No biliary dilatation. Pancreas: Unremarkable. Spleen: Unchanged focal posterior splenic calcification. Adrenals/Urinary Tract: Unremarkable adrenal glands. No evidence of renal mass, calculi, or hydronephrosis. Unremarkable bladder. Stomach/Bowel: A gastrostomy tube is present in the body of the stomach. There is moderate soft tissue thickening/stranding and small volume unorganized fluid and/or hematoma adjacent to the tube in the anterior abdominal wall. Oral contrast is present in loops of small and large bowel to the level the rectum. There is swirling of the mesenteric vessels in the central pelvis without evidence of bowel obstruction. The appendix is not clearly identified, however no inflammatory changes are present in the right lower quadrant. Vascular/Lymphatic: Abdominal aortic atherosclerosis without aneurysm. No enlarged lymph nodes. Reproductive: Status post hysterectomy. No adnexal  masses. Other: No ascites or pneumoperitoneum. Fat containing ventral epigastric hernia. Foci of soft tissue density and gas in the left lower quadrant abdominal wall, likely related to subcutaneous medication injection. Musculoskeletal: No acute osseous abnormality or suspicious osseous lesion. IMPRESSION: 1. Gastrostomy tube in place in the stomach. Soft tissue stranding and small volume fluid/hematoma around the tube in the abdominal wall. No evidence of abscess. 2. No other evidence of acute abnormality in the abdomen or pelvis. 3.  Aortic Atherosclerosis (ICD10-I70.0). Electronically Signed   By: Sebastian Ache M.D.   On: 09/06/2017 08:09    DVT Prophylaxis -SCD/Lovenox AM Labs Ordered, also please review Full Orders  Family Communication: Admission, patients condition and plan of care including tests being ordered have been discussed with the patient and son who indicate understanding and agree with the plan   Code Status - Full Code  Likely  DC to [place] nursing facility  Condition   Stable  Shon Hale M.D on 09/06/2017 at 6:28 PM  Go to [email or web address] - password [email or web address] for contact info  [facility] - Office  [phone]

## 2017-09-06 NOTE — ED Notes (Signed)
Bed: [facility] Expected date:  Expected time:  Means of arrival:  Comments: No bed 

## 2017-09-06 NOTE — Care Management Note (Signed)
Case Management Note  Patient Details  Name: Rachel Vang MRN: [id number] Date of Birth: 12/06/1965  Subjective/Objective:        Replacement of g tube after patient pulled old one out at snf/found to be febrile-101.0/urine and bld cultures pending/ct scan of abd neg for source of infection.            Action/Plan:will follow for needs from [facility]   Expected Discharge Date:  (unknown)               Expected Discharge Plan:  Skilled Nursing Facility  In-House Referral:  Clinical Social Work  Discharge planning Services  CM Consult  Post Acute Care Choice:    Choice offered to:     DME Arranged:    DME Agency:     HH Arranged:    HH Agency:     Status of Service:  Completed, signed off  If discussed at [employer] Meetings, dates discussed:    Additional Comments:  Golda AcreDavis, Menna Abeln Lynn, RN 09/06/2017, 12:05 PM

## 2017-09-06 NOTE — Progress Notes (Signed)
A consult was received from an ED physician for cefepime and vancomycin per pharmacy dosing.  The patient's profile has been reviewed for ht/wt/allergies/indication/available labs.   A one time order has been placed for Cefepime 1 gm and Vancomycin 1250 mg.  Further antibiotics/pharmacy consults should be ordered by admitting physician if indicated.                       Thank you, Lorenza EvangelistGreen, Von Quintanar R 09/06/2017  6:24 AM

## 2017-09-06 NOTE — ED Provider Notes (Signed)
[facility]-EMERGENCY DEPT Provider Note   CSN: [id number] Arrival date & time: 09/06/17  0101     History   Chief Complaint Chief Complaint  Patient presents with  . Pulled out G tube    HPI Rachel Vang is a 52 y.o. female.  Level 5 caveat for nonverbal patient.  Patient sent from facility after pulling out her G-tube.  This happened sometime in the evening hours.  Patient with history of subarachnoid hemorrhage, PE, DVT, diabetes, G-tube dependent.  There is no report of fever, vomiting or diarrhea.  The history is provided by the patient and the EMS personnel. The history is limited by the condition of the patient.    Past Medical History:  Diagnosis Date  . Acute pulmonary embolism (HCC) 02/19/2015  . Acute respiratory failure (HCC)   . Diabetes mellitus without complication (HCC)    Type 2, W/o complications  . DVT (deep venous thrombosis) (HCC) 04/05/2015  . Dysphagia   . Epilepsy (HCC)   . GERD (gastroesophageal reflux disease)   . Hyperlipidemia   . Hypertension   . IBS (irritable bowel syndrome)   . Nontraumatic subarachnoid hemorrhage (HCC)   . SAH (subarachnoid hemorrhage) (HCC)   . Urinary retention     Patient Active Problem List   Diagnosis Date Noted  . Slow transit constipation 06/08/2017  . Insomnia 06/08/2017  . Generalized weakness 06/07/2017  . Aspiration pneumonia of both lower lobes due to gastric secretions (HCC)   . Demand ischemia (HCC)   . Chronic pain of right knee   . Chronic heel pain, right   . Small bowel obstruction (HCC)   . SVT (supraventricular tachycardia) (HCC)   . AKI (acute kidney injury) (HCC) 05/06/2017  . Hypernatremia 05/06/2017  . Dyslipidemia associated with type 2 diabetes mellitus (HCC) 04/27/2017  . Gingivitis, acute, plaque induced 03/26/2017  . Hypersecretion of saliva 03/14/2017  . Bruxism (teeth grinding) 03/14/2017  . Lobar pneumonia (HCC)   . Acute on chronic respiratory failure  with hypoxia (HCC)   . Pressure injury of skin 02/16/2017  . SOB (shortness of breath)   . Severe sepsis (HCC) 02/15/2017  . Foot drop, right foot 10/18/2016  . CVA (cerebrovascular accident) (HCC) 08/30/2016  . GERD without esophagitis 08/30/2016  . Chronic pulmonary embolism (HCC) 07/03/2016  . Weight loss, non-intentional 06/13/2016  . Hemiparesis affecting right side as late effect of stroke (HCC) 09/25/2015  . Type II diabetes mellitus with neurological manifestations (HCC) 05/20/2015  . Essential hypertension, benign 05/20/2015  . Status post insertion of percutaneous endoscopic gastrostomy (PEG) tube (HCC) 05/20/2015  . DVT (deep venous thrombosis) (HCC) 04/05/2015  . Protein-calorie malnutrition, severe (HCC) 03/25/2015  . Dysphagia 02/18/2015  . Seizures (HCC) 02/18/2015  . Elevated troponin 02/18/2015  . Urine retention 02/18/2015  . Sepsis (HCC) 02/17/2015  . History of ETT   . Subarachnoid hemorrhage [facility])     Past Surgical History:  Procedure Laterality Date  . ABDOMINAL SURGERY    . ANEURYSM COILING    . COLONOSCOPY N/A 02/20/2015   Procedure: COLONOSCOPY;  Surgeon: Iva Boop, MD;  Location: [facility] ENDOSCOPY;  Service: Endoscopy;  Laterality: N/A;  . ESOPHAGOGASTRODUODENOSCOPY (EGD) WITH PROPOFOL N/A 02/02/2015   Procedure: ESOPHAGOGASTRODUODENOSCOPY (EGD) WITH PROPOFOL;  Surgeon: Jimmye Norman, MD;  Location: [facility] ENDOSCOPY;  Service: General;  Laterality: N/A;  . IR GENERIC HISTORICAL  08/26/2015   IR GASTRIC TUBE PERC CHG W/O IMG GUIDE 08/26/2015 Irish Lack, MD [facility]-INTERV RAD  .  IR GENERIC HISTORICAL  09/14/2015   IR REPLC GASTRO/COLONIC TUBE PERCUT W/FLUORO 09/14/2015 Darrell K Allred, PA-C [facility]-INTERV RAD  . IR REPLACE G-TUBE SIMPLE WO FLUORO  06/08/2016  . IR REPLACE G-TUBE SIMPLE WO FLUORO  10/24/2016  . IR REPLACE G-TUBE SIMPLE WO FLUORO  01/07/2017  . PEG PLACEMENT N/A 02/02/2015   Procedure: PERCUTANEOUS ENDOSCOPIC GASTROSTOMY (PEG) PLACEMENT;  Surgeon: Jimmye NormanJames Wyatt,  MD;  [facility];  Service: General;  Laterality: N/A;  . RADIOLOGY WITH ANESTHESIA N/A 01/13/2015   Procedure: RADIOLOGY WITH ANESTHESIA;  Surgeon: Lisbeth RenshawNeelesh Nundkumar, MD;  Location: [facility] OR;  Service: Radiology;  Laterality: N/A;  . TRACHEOSTOMY       OB History   None      Home Medications    Prior to Admission medications   Medication Sig Start Date End Date Taking? Authorizing Provider  acetaminophen (TYLENOL) 325 MG tablet Place 650 mg into feeding tube every 4 (four) hours as needed for mild pain or fever.     [provider]  acyclovir (ZOVIRAX) 400 MG tablet Please take medications 4 times a day through PEG tube. 08/09/17   Zadie RhineWickline, Donald, MD  bethanechol (URECHOLINE) 10 MG tablet Place 10 mg into feeding tube 3 (three) times daily.     [provider]  enoxaparin (LOVENOX) 60 MG/0.6ML injection Inject 60 mg into the skin every 12 (twelve) hours.    [provider]  gabapentin (NEURONTIN) 100 MG capsule 200 mg 2 (two) times daily. Per G-Tube HOLD IF LETHARGIC 04/04/17   Ellwood Sayersananis, Leonard, MD  levETIRAcetam (KEPPRA) 100 MG/ML solution Place 5 mLs (500 mg total) into feeding tube 2 (two) times daily. 02/11/15   Leroy SeaSingh, Prashant K, MD  metoprolol tartrate (LOPRESSOR) 25 MG tablet Take 25 mg by mouth 2 (two) times daily. Hold id SBP < 100 apical pulse    [provider]  Multiple Vitamin (MULTIVITAMIN) tablet Place 1 tablet into feeding tube daily.     [provider]  Nutritional Supplements (FEEDING SUPPLEMENT, JEVITY 1.5 CAL,) LIQD Continue with 42 cc per hour and flush with 200 cc every 4 hours    [provider]  nystatin (MYCOSTATIN/NYSTOP) powder Apply topically 3 (three) times daily. For one week 08/09/17   Zadie RhineWickline, Donald, MD  polyethylene glycol [facility](MIRALAX / Ethelene HalGLYCOLAX) packet Place 17 g into feeding tube daily.     [provider]  Probiotic CAPS Place 1 capsule into feeding tube 2 (two) times daily.     [provider]  ranitidine (ZANTAC) 150 MG tablet Place 150 mg into feeding tube daily.  04/25/17   [provider]  rosuvastatin (CRESTOR) 10 MG tablet Take 10 mg by mouth daily.    [provider]  sertraline (ZOLOFT) 100 MG tablet Take 100 mg by mouth daily.  07/12/17   [provider]  traZODone (DESYREL) 50 MG tablet Place 50 mg into feeding tube at bedtime.    [provider]  Water For Irrigation, Sterile (FREE WATER) SOLN Place 200 mLs into feeding tube 4 (four) times daily. 02/22/17   Calvert Cantorizwan, Saima, MD    Family History Family History  Problem Relation Age of Onset  . Hypertension Other     Social History Social History   Tobacco Use  . Smoking status: Former Games developermoker  . Smokeless tobacco: Never Used  Substance Use Topics  . Alcohol use: No  . Drug use: No     Allergies   Patient has no known allergies.  Review of Systems Review of Systems  Unable to perform ROS: Patient nonverbal     Physical Exam Updated Vital Signs BP 114/85 (BP Location: Right Arm)   Pulse 100   Temp 98.8 F (37.1 C) (Oral)   Resp 20   Ht 5\' 1"  (1.549 m)   Wt 58.1 kg   LMP  (LMP Unknown)   SpO2 100%   BMI 24.20 kg/m   Physical Exam  Constitutional: She appears well-developed and well-nourished. No distress.  Nonverbal, greets teeth, moans  HENT:  Head: Normocephalic and atraumatic.  Mouth/Throat: Oropharynx is clear and moist. No oropharyngeal exudate.  Eyes: Pupils are equal, round, and reactive to light. Conjunctivae and EOM are normal.  Neck: Normal range of motion. Neck supple.  No meningismus.  Cardiovascular: Normal rate, regular rhythm, normal heart sounds and intact distal pulses.  No murmur heard. Pulmonary/Chest: Effort normal and breath sounds normal. No respiratory distress. She exhibits no tenderness.  Abdominal: Soft. There is no tenderness. There is no rebound and no guarding.  Mild diffuse tenderness, G-tube site  stoma with granulation tissue, no bleeding  Musculoskeletal: Normal range of motion. She exhibits no edema or tenderness.  Neurological: She is alert. No cranial nerve deficit. She exhibits normal muscle tone. Coordination normal.  Patient shakes her head yes or no, she is awake and alert, unable to participate in formal neurological testing  Skin: Skin is warm. Capillary refill takes less than 2 seconds. No rash noted.  Psychiatric: She has a normal mood and affect. Her behavior is normal.  Nursing note and vitals reviewed.    ED Treatments / Results  Labs (all labs ordered are listed, but only abnormal results are displayed) Labs Reviewed  CBC WITH DIFFERENTIAL/PLATELET - Abnormal; Notable for the following components:      Result Value   WBC 11.1 (*)    Neutro Abs 7.9 (*)    All other components within normal limits  COMPREHENSIVE METABOLIC PANEL - Abnormal; Notable for the following components:   Glucose, Bld 101 (*)    ALT 51 (*)    All other components within normal limits  URINALYSIS, ROUTINE W REFLEX MICROSCOPIC - Abnormal; Notable for the following components:   Leukocytes, UA SMALL (*)    All other components within normal limits  CULTURE, BLOOD (ROUTINE X 2)  CULTURE, BLOOD (ROUTINE X 2)  URINE CULTURE  I-STAT CG4 LACTIC ACID, ED  I-STAT CG4 LACTIC ACID, ED    EKG None  Radiology Dg Chest 2 View  Result Date: 09/06/2017 CLINICAL DATA:  Acute onset of fever. EXAM: CHEST - 2 VIEW COMPARISON:  Chest radiograph and CTA of the chest performed 05/06/2017 FINDINGS: The lungs are well-aerated and clear. There is no evidence of focal opacification, pleural effusion or pneumothorax. The heart is normal in size; the mediastinal contour is within normal limits. No acute osseous abnormalities are seen. IMPRESSION: No acute cardiopulmonary process seen. Electronically Signed   By: Roanna Raider M.D.   On: 09/06/2017 05:12   Dg Abdomen 1 View  Result Date: 09/06/2017 CLINICAL  DATA:  G-tube placement. EXAM: ABDOMEN - 1 VIEW COMPARISON:  Abdominal radiograph performed 05/08/2017 FINDINGS: Contrast injected into the patient's G-tube is noted filling the antrum of the stomach and proximal duodenum as expected. The visualized bowel gas pattern is grossly unremarkable. No free intra-abdominal air is seen, though evaluation for free air is limited on a single supine view. There is elevation of the right hemidiaphragm. No acute osseous abnormalities are  seen. IMPRESSION: Contrast injected into the G-tube is noted filling the antrum of the stomach and proximal duodenum as expected. Electronically Signed   By: Roanna Raider M.D.   On: 09/06/2017 03:42    Procedures Gastrostomy tube replacement Date/Time: 09/06/2017 3:09 AM Performed by: Glynn Octave, MD Authorized by: Glynn Octave, MD  Consent: Verbal consent obtained. Risks and benefits: risks, benefits and alternatives were discussed Consent given by: patient Patient identity confirmed: verbally with patient Time out: Immediately prior to procedure a "time out" was called to verify the correct patient, procedure, equipment, support staff and site/side marked as required. Preparation: Patient was prepped and draped in the usual sterile fashion. Local anesthesia used: no  Anesthesia: Local anesthesia used: no  Sedation: Patient sedated: no  Patient tolerance: Patient tolerated the procedure well with no immediate complications    (including critical care time)  Medications Ordered in ED Medications - No data to display   Initial Impression / Assessment and Plan / ED Course  I have reviewed the triage vital signs and the nursing notes.  Pertinent labs & imaging results that were available during my care of the patient were reviewed by me and considered in my medical decision making (see chart for details).     Patient sent with G-tube dislodgment.  No other reported complaints.  16 French G-tube placed  at bedside.  Will obtain x-ray to confirm placement  G-tube placed successfully.  Confirmed on x-ray.  On attempted discharge, patient found to be tachycardic and febrile to 101 rectally.  Discharge is on hold and will obtain labs, urine cultures.  Patient given IV fluids and broad-spectrum antibiotics after cultures obtained.  Lactate is normal.  White blood cell count minimally elevated. Chest x-ray and urine are negative.  Unclear source of fever but patient remains tachycardic after antipyretics and IV hydration. Given her complex medical history and nonverbal status, will admit for observation for her fever and potential sepsis.  Discussed with Dr. Julian Reil.  He suggests CT abdomen to evaluate for occult infection. Final Clinical Impressions(s) / ED Diagnoses   Final diagnoses:  Sepsis, due to unspecified organism [facility])  Dislodged gastrostomy tube [facility])    ED Discharge Orders    None       Glynn Octave, MD 09/06/17 [phone]

## 2017-09-06 NOTE — ED Notes (Signed)
Bed: WA06 Expected date:  Expected time:  Means of arrival:  Comments: Held for EMS

## 2017-09-06 NOTE — ED Notes (Signed)
[facility] called for transport at this time 

## 2017-09-07 DIAGNOSIS — R509 Fever, unspecified: Secondary | ICD-10-CM | POA: Diagnosis not present

## 2017-09-07 DIAGNOSIS — I825Y2 Chronic embolism and thrombosis of unspecified deep veins of left proximal lower extremity: Secondary | ICD-10-CM | POA: Diagnosis not present

## 2017-09-07 DIAGNOSIS — I10 Essential (primary) hypertension: Secondary | ICD-10-CM

## 2017-09-07 DIAGNOSIS — R1314 Dysphagia, pharyngoesophageal phase: Secondary | ICD-10-CM | POA: Diagnosis not present

## 2017-09-07 DIAGNOSIS — I609 Nontraumatic subarachnoid hemorrhage, unspecified: Secondary | ICD-10-CM

## 2017-09-07 DIAGNOSIS — I639 Cerebral infarction, unspecified: Secondary | ICD-10-CM | POA: Diagnosis not present

## 2017-09-07 DIAGNOSIS — R569 Unspecified convulsions: Secondary | ICD-10-CM

## 2017-09-07 LAB — URINE CULTURE: CULTURE: NO GROWTH

## 2017-09-07 LAB — CBC
HEMATOCRIT: 35.6 % — AB (ref 36.0–46.0)
Hemoglobin: 11.1 g/dL — ABNORMAL LOW (ref 12.0–15.0)
MCH: 29 pg (ref 26.0–34.0)
MCHC: 31.2 g/dL (ref 30.0–36.0)
MCV: 93 fL (ref 78.0–100.0)
Platelets: 225 10*3/uL (ref 150–400)
RBC: 3.83 MIL/uL — ABNORMAL LOW (ref 3.87–5.11)
RDW: 15.8 % — ABNORMAL HIGH (ref 11.5–15.5)
WBC: 7.1 10*3/uL (ref 4.0–10.5)

## 2017-09-07 LAB — BASIC METABOLIC PANEL
Anion gap: 6 (ref 5–15)
BUN: 11 mg/dL (ref 6–20)
CHLORIDE: 111 mmol/L (ref 98–111)
CO2: 24 mmol/L (ref 22–32)
Calcium: 9.5 mg/dL (ref 8.9–10.3)
Creatinine, Ser: 0.76 mg/dL (ref 0.44–1.00)
GFR calc Af Amer: >60 mL/min (ref >60)
GFR calc non Af Amer: >60 mL/min (ref >60)
GLUCOSE: 119 mg/dL — AB (ref 70–99)
POTASSIUM: 3.8 mmol/L (ref 3.5–5.1)
Sodium: 141 mmol/L (ref 135–145)

## 2017-09-07 MED ORDER — LIP MEDEX EX OINT
TOPICAL_OINTMENT | CUTANEOUS | Status: AC
  Administered 2017-09-07: 11:00:00
  Filled 2017-09-07: qty 7

## 2017-09-07 NOTE — Progress Notes (Signed)
PROGRESS NOTE  Rachel MinksStephanie N Vang ZOX:[id number] DOB: 12/09/1965 DOA: 09/06/2017 PCP: Kirt Boysarter, Monica, DO  HPI/Recap of past 6424 hours: 52 year old female, a resident of [facility] facility, with past medical history significant for subarachnoid hemorrhage, prior CVAs with resulting dysphasia and aphasia, status post PEG tube placement, history of DVT presents to the ED, after accidentally pulling out PEG tube.  In the ED, PEG tube was placed and plan was to discharge patient back to facility when she spiked a temp of around 101.  CT abdomen/pelvis showed gastrostomy tube in place, with some soft tissue stranding and possible hematoma around the tube in the abdominal wall.  No evidence of abscess, otherwise unremarkable.  Chest x-ray unremarkable, UA not suggestive of UTI, mild leukocytosis.  Patient was given 1 dose of Vanco and cefepime in the ED.  Patient admitted for further management.  Today, patient looked comfortable, was able to nod in response.  Denies any abdominal pain, chest pain, no further fever/chills.  Assessment/Plan: Principal Problem:   Fever Active Problems:   Subarachnoid hemorrhage (HCC)   Dysphagia   Seizures (HCC)   DVT (deep venous thrombosis) (HCC)   Type II diabetes mellitus with neurological manifestations (HCC)   Essential hypertension, benign   CVA (cerebrovascular accident) (HCC)   Fever of unclear etiology On admission had a temp of 101, with mild leukocytosis Currently afebrile, with resolved leukocytosis Lactic acid within normal limits BC x2 NGTD UA unremarkable, UC no growth Chest x-ray unremarkable Status post 1 dose of Vanco and cefepime in the ED Continue to monitor for another day without antibiotics Monitor closely  Hypertension Continue home metoprolol  Dysphasia Continue tube feeds, flushes  History of SAH Continue Keppra for seizure ppx, Neurontin  History of DVT Continue Lovenox 60 mg every 12 hours    Code Status:  Full  Family Communication: None at bedside  Disposition Plan: Plan for discharge back to [facility] facility on 09/08/2017   Consultants:  None  Procedures:  G-tube replaced  Antimicrobials:  None  DVT prophylaxis: Lovenox   Objective: Vitals:   09/06/17 1428 09/06/17 2110 09/06/17 2116 09/07/17 0422  BP: 104/75 113/60  106/73  Pulse: 86 (!) 31 62 83  Resp: 20 16  18   Temp: 98.7 F (37.1 C) 99.3 F (37.4 C)  98.6 F (37 C)  TempSrc: Oral Oral  Oral  SpO2: 99% 96%  96%  Weight:      Height:        Intake/Output Summary (Last 24 hours) at 09/07/2017 1423 Last data filed at 09/06/2017 2300 Gross per 24 hour  Intake 0 ml  Output 3 ml  Net -3 ml   Filed Weights   09/06/17 0110  Weight: 58.1 kg    Exam:   General: NAD, aphasia  Cardiovascular: S1, S2 present  Respiratory: CTA B  Abdomen: Soft, nontender, nondistended, bowel sounds present, PEG tube in place  Musculoskeletal: No pedal edema bilaterally  Skin: Normal  Psychiatry: Unable to assess   Data Reviewed: CBC: Recent Labs  Lab 09/06/17 0529 09/07/17 0636  WBC 11.1* 7.1  NEUTROABS 7.9*  --   HGB 12.2 11.1*  HCT 37.9 35.6*  MCV 90.5 93.0  PLT 273 225   Basic Metabolic Panel: Recent Labs  Lab 09/06/17 0529 09/07/17 0636  NA 141 141  K 4.2 3.8  CL 106 111  CO2 25 24  GLUCOSE 101* 119*  BUN 17 11  CREATININE 0.94 0.76  CALCIUM 9.8 9.5   GFR:  Estimated Creatinine Clearance: 65.9 mL/min (by C-G formula based on SCr of 0.76 mg/dL). Liver Function Tests: Recent Labs  Lab 09/06/17 0529  AST 39  ALT 51*  ALKPHOS 95  BILITOT 0.8  PROT 7.9  ALBUMIN 4.0   No results for input(s): LIPASE, AMYLASE in the last 168 hours. No results for input(s): AMMONIA in the last 168 hours. Coagulation Profile: No results for input(s): INR, PROTIME in the last 168 hours. Cardiac Enzymes: No results for input(s): CKTOTAL, CKMB, CKMBINDEX, TROPONINI in the last 168 hours. BNP (last 3  results) No results for input(s): PROBNP in the last 8760 hours. HbA1C: No results for input(s): HGBA1C in the last 72 hours. CBG: No results for input(s): GLUCAP in the last 168 hours. Lipid Profile: No results for input(s): CHOL, HDL, LDLCALC, TRIG, CHOLHDL, LDLDIRECT in the last 72 hours. Thyroid Function Tests: No results for input(s): TSH, T4TOTAL, FREET4, T3FREE, THYROIDAB in the last 72 hours. Anemia Panel: No results for input(s): VITAMINB12, FOLATE, FERRITIN, TIBC, IRON, RETICCTPCT in the last 72 hours. Urine analysis:    Component Value Date/Time   COLORURINE YELLOW 09/06/2017 0452   APPEARANCEUR CLEAR 09/06/2017 0452   LABSPEC 1.020 09/06/2017 0452   PHURINE 7.0 09/06/2017 0452   GLUCOSEU NEGATIVE 09/06/2017 0452   HGBUR NEGATIVE 09/06/2017 0452   BILIRUBINUR NEGATIVE 09/06/2017 0452   KETONESUR NEGATIVE 09/06/2017 0452   PROTEINUR NEGATIVE 09/06/2017 0452   NITRITE NEGATIVE 09/06/2017 0452   LEUKOCYTESUR SMALL (A) 09/06/2017 0452   Sepsis Labs: @LABRCNTIP (procalcitonin:4,lacticidven:4)  ) Recent Results (from the past 240 hour(s))  Urine Culture     Status: None   Collection Time: 09/06/17  4:52 AM  Result Value Ref Range Status   Specimen Description   Final    URINE, CATHETERIZED Performed at [facility], [address] [place], [place] [address]    Special Requests   Final    NONE Performed at [facility], [address], [place], [place] [address]    Culture   Final    NO GROWTH Performed at [facility], [address], [place], [place] [address]    Report Status 09/07/2017 FINAL  Final  Blood culture (routine x 2)     Status: None (Preliminary result)   Collection Time: 09/06/17  5:29 AM  Result Value Ref Range Status   Specimen Description   Final    BLOOD LEFT WRIST Performed at [facility], [address], [place], [place] [address]    Special Requests   Final    BOTTLES  DRAWN AEROBIC AND ANAEROBIC Blood Culture adequate volume Performed at [facility], [address], [place], [place] [address]    Culture   Final    NO GROWTH 1 DAY Performed at [facility], [address], [place], [place] [address]    Report Status PENDING  Incomplete  Blood culture (routine x 2)     Status: None (Preliminary result)   Collection Time: 09/06/17  5:48 AM  Result Value Ref Range Status   Specimen Description   Final    BLOOD RIGHT ARM Performed at [facility], [address] [place], [place] [address]    Special Requests   Final    BOTTLES DRAWN AEROBIC ONLY Blood Culture results may not be optimal due to an excessive volume of blood received in culture bottles Performed at [facility], [address] [place], [place] [address]  Culture   Final    NO GROWTH 1 DAY Performed at [facility], [address], [place], [place] [address]    Report Status PENDING  Incomplete      Studies: No results found.  Scheduled Meds: . lip balm      . bethanechol  10 mg Per Tube TID  . enoxaparin  60 mg Subcutaneous Q12H  . feeding supplement (PRO-STAT SUGAR FREE 64)  30 mL Per Tube Daily  . free water  200 mL Per Tube QID  . gabapentin  200 mg Per Tube BID  . lactobacillus  1 g Per Tube BID  . levETIRAcetam  500 mg Per Tube BID  . metoprolol tartrate  25 mg Oral BID  . multivitamin  15 mL Per Tube Daily  . polyethylene glycol  17 g Per Tube Daily  . ranitidine  150 mg Per Tube QHS  . rosuvastatin  10 mg Oral Daily  . sertraline  100 mg Oral Daily  . sodium chloride flush  3 mL Intravenous Q12H  . traZODone  50 mg Per Tube QHS    Continuous Infusions: . sodium chloride Stopped (09/06/17 0737)  . sodium chloride    . feeding supplement (JEVITY 1.5 CAL/FIBER) 1,000 mL (09/07/17 1042)     LOS: 0 days     Briant Cedar, MD [facility]   If 7PM-7AM, please contact  night-coverage [email or web address] Password [email or web address] 09/07/2017, 2:23 PM

## 2017-09-08 DIAGNOSIS — R509 Fever, unspecified: Secondary | ICD-10-CM | POA: Diagnosis not present

## 2017-09-08 DIAGNOSIS — I639 Cerebral infarction, unspecified: Secondary | ICD-10-CM | POA: Diagnosis not present

## 2017-09-08 DIAGNOSIS — R1314 Dysphagia, pharyngoesophageal phase: Secondary | ICD-10-CM | POA: Diagnosis not present

## 2017-09-08 DIAGNOSIS — I825Y2 Chronic embolism and thrombosis of unspecified deep veins of left proximal lower extremity: Secondary | ICD-10-CM | POA: Diagnosis not present

## 2017-09-08 LAB — BASIC METABOLIC PANEL
ANION GAP: 10 (ref 5–15)
BUN: 14 mg/dL (ref 6–20)
CHLORIDE: 107 mmol/L (ref 98–111)
CO2: 24 mmol/L (ref 22–32)
Calcium: 9.4 mg/dL (ref 8.9–10.3)
Creatinine, Ser: 0.59 mg/dL (ref 0.44–1.00)
GFR calc Af Amer: >60 mL/min (ref >60)
GFR calc non Af Amer: >60 mL/min (ref >60)
GLUCOSE: 104 mg/dL — AB (ref 70–99)
Potassium: 4.1 mmol/L (ref 3.5–5.1)
SODIUM: 141 mmol/L (ref 135–145)

## 2017-09-08 LAB — CBC WITH DIFFERENTIAL/PLATELET
BASOS ABS: 0 10*3/uL (ref 0.0–0.1)
Basophils Relative: 0 %
EOS PCT: 3 %
Eosinophils Absolute: 0.2 10*3/uL (ref 0.0–0.7)
HCT: 36.5 % (ref 36.0–46.0)
HEMOGLOBIN: 11.4 g/dL — AB (ref 12.0–15.0)
LYMPHS ABS: 2 10*3/uL (ref 0.7–4.0)
LYMPHS PCT: 30 %
MCH: 29.2 pg (ref 26.0–34.0)
MCHC: 31.2 g/dL (ref 30.0–36.0)
MCV: 93.4 fL (ref 78.0–100.0)
Monocytes Absolute: 0.5 10*3/uL (ref 0.1–1.0)
Monocytes Relative: 8 %
NEUTROS ABS: 3.9 10*3/uL (ref 1.7–7.7)
Neutrophils Relative %: 59 %
PLATELETS: 219 10*3/uL (ref 150–400)
RBC: 3.91 MIL/uL (ref 3.87–5.11)
RDW: 15.7 % — ABNORMAL HIGH (ref 11.5–15.5)
WBC: 6.6 10*3/uL (ref 4.0–10.5)

## 2017-09-08 NOTE — Progress Notes (Signed)
Pt will discharge today, [facility] [place]. Writer [facility]

## 2017-09-08 NOTE — Discharge Summary (Signed)
Discharge Summary  Rachel Vang NFA:[id number] DOB: [birth year]  PCP: Kirt Boys, DO  Admit date: 09/06/2017 Discharge date: 09/08/2017  Time spent: 30 mins   Recommendations for Outpatient Follow-up:  1. PCP  Discharge Diagnoses:  Active Hospital Problems   Diagnosis Date Noted  . Fever 09/06/2017  . CVA (cerebrovascular accident) (HCC) 08/30/2016  . Type II diabetes mellitus with neurological manifestations (HCC) 05/20/2015  . Essential hypertension, benign 05/20/2015  . DVT (deep venous thrombosis) (HCC) 04/05/2015  . Dysphagia 02/18/2015  . Seizures (HCC) 02/18/2015  . Subarachnoid hemorrhage [facility])     Resolved Hospital Problems  No resolved problems to display.    Discharge Condition: Stable  Diet recommendation: Tube feeds  Vitals:   09/07/17 1939 09/08/17 0445  BP: 122/86 98/65  Pulse: 75 79  Resp: 18 16  Temp: 98.8 F (37.1 C) 98.5 F (36.9 C)  SpO2: 98% 96%    History of present illness:  52 year old female, a resident of [address] facility, with past medical history significant for subarachnoid hemorrhage, prior CVAs with resulting dysphasia and aphasia, status post PEG tube placement, history of DVT presents to the ED, after accidentally pulling out PEG tube.  In the ED, PEG tube was placed and plan was to discharge patient back to facility when she spiked a temp of around 101.  CT abdomen/pelvis showed gastrostomy tube in place, with some soft tissue stranding and possible hematoma around the tube in the abdominal wall.  No evidence of abscess, otherwise unremarkable.  Chest x-ray unremarkable, UA not suggestive of UTI, mild leukocytosis.  Patient was given 1 dose of Vanco and cefepime in the ED.  Patient admitted for further management.  Today, patient looked comfortable, was able to nod in response. Stable for discharge back to NH.  Hospital Course:  Principal Problem:   Fever Active Problems:   Subarachnoid hemorrhage (HCC)    Dysphagia   Seizures (HCC)   DVT (deep venous thrombosis) (HCC)   Type II diabetes mellitus with neurological manifestations (HCC)   Essential hypertension, benign   CVA (cerebrovascular accident) (HCC)  Fever of unclear etiology On admission had a temp of 101, with mild leukocytosis Currently afebrile, with resolved leukocytosis Lactic acid within normal limits BC x2 NGTD UA unremarkable, UC no growth Chest x-ray unremarkable Status post 1 dose of Vanco and cefepime in the ED Monitored for 2 days without antibiotics, stable to be discharged No further w/u needed  Hypertension Continue home metoprolol  Dysphasia Continue tube feeds, flushes  History of SAH Continue Keppra for seizure ppx, Neurontin  History of DVT Continue Lovenox 60 mg every 12 hours    Procedures:  None  Consultations:  None   Discharge Exam: BP 98/65 (BP Location: Right Arm)   Pulse 79   Temp 98.5 F (36.9 C) (Oral)   Resp 16   Ht 5\' 1"  (1.549 m)   Wt 58.1 kg   LMP  (LMP Unknown)   SpO2 96%   BMI 24.20 kg/m   General: NAD Cardiovascular: S1, S2 present Respiratory: CTAB  Discharge Instructions You were cared for by a hospitalist during your hospital stay. If you have any questions about your discharge medications or the care you received while you were in the hospital after you are discharged, you can call the unit and asked to speak with the hospitalist on call if the hospitalist that took care of you is not available. Once you are discharged, your primary care physician will handle any further medical  issues. Please note that NO REFILLS for any discharge medications will be authorized once you are discharged, as it is imperative that you return to your primary care physician (or establish a relationship with a primary care physician if you do not have one) for your aftercare needs so that they can reassess your need for medications and monitor your lab values.   Allergies as of  09/08/2017   No Known Allergies     Medication List    STOP taking these medications   acyclovir 400 MG tablet Commonly known as:  ZOVIRAX   nystatin powder Commonly known as:  MYCOSTATIN/NYSTOP     TAKE these medications   acetaminophen 325 MG tablet Commonly known as:  TYLENOL Place 650 mg into feeding tube every 4 (four) hours as needed for mild pain or fever.   bethanechol 10 MG tablet Commonly known as:  URECHOLINE Place 10 mg into feeding tube 3 (three) times daily.   enoxaparin 60 MG/0.6ML injection Commonly known as:  LOVENOX Inject 60 mg into the skin every 12 (twelve) hours.   feeding supplement (JEVITY 1.5 CAL) Liqd Place 42 mLs into feeding tube every hour. Continue with 42 cc per hour and flush with 200 cc every 4 hours   feeding supplement (PRO-STAT SUGAR FREE 64) Liqd Place 30 mLs into feeding tube daily.   free water Soln Place 200 mLs into feeding tube 4 (four) times daily.   gabapentin 100 MG capsule Commonly known as:  NEURONTIN Take 200 mg by mouth 2 (two) times daily. Per G-Tube HOLD IF LETHARGIC   levETIRAcetam 100 MG/ML solution Commonly known as:  KEPPRA Place 5 mLs (500 mg total) into feeding tube 2 (two) times daily.   metoprolol tartrate 25 MG tablet Commonly known as:  LOPRESSOR Take 25 mg by mouth 2 (two) times daily. Hold if SBP < 100 apical pulse   multivitamin tablet Place 1 tablet into feeding tube daily.   polyethylene glycol packet Commonly known as:  MIRALAX / GLYCOLAX Place 17 g into feeding tube daily.   Probiotic Caps Place 1 capsule into feeding tube 2 (two) times daily.   ranitidine 150 MG tablet Commonly known as:  ZANTAC Place 150 mg into feeding tube daily.   rosuvastatin 10 MG tablet Commonly known as:  CRESTOR Take 10 mg by mouth daily.   sertraline 100 MG tablet Commonly known as:  ZOLOFT Take 100 mg by mouth daily.   traZODone 50 MG tablet Commonly known as:  DESYREL Place 50 mg into feeding tube  at bedtime.      No Known Allergies Follow-up Information    Kirt Boys, DO. Schedule an appointment as soon as possible for a visit in 1 week(s).   Specialty:  Internal Medicine Contact information: [address] [place] [address] [phone]            The results of significant diagnostics from this hospitalization (including imaging, microbiology, ancillary and laboratory) are listed below for reference.    Significant Diagnostic Studies: Dg Chest 2 View  Result Date: 09/06/2017 CLINICAL DATA:  Acute onset of fever. EXAM: CHEST - 2 VIEW COMPARISON:  Chest radiograph and CTA of the chest performed 05/06/2017 FINDINGS: The lungs are well-aerated and clear. There is no evidence of focal opacification, pleural effusion or pneumothorax. The heart is normal in size; the mediastinal contour is within normal limits. No acute osseous abnormalities are seen. IMPRESSION: No acute cardiopulmonary process seen. Electronically Signed   By: Leotis Shames  Chang M.D.   On: 09/06/2017 05:12   Dg Abdomen 1 View  Result Date: 09/06/2017 CLINICAL DATA:  G-tube placement. EXAM: ABDOMEN - 1 VIEW COMPARISON:  Abdominal radiograph performed 05/08/2017 FINDINGS: Contrast injected into the patient's G-tube is noted filling the antrum of the stomach and proximal duodenum as expected. The visualized bowel gas pattern is grossly unremarkable. No free intra-abdominal air is seen, though evaluation for free air is limited on a single supine view. There is elevation of the right hemidiaphragm. No acute osseous abnormalities are seen. IMPRESSION: Contrast injected into the G-tube is noted filling the antrum of the stomach and proximal duodenum as expected. Electronically Signed   By: Roanna Raider M.D.   On: 09/06/2017 03:42   Ct Abdomen Pelvis W Contrast  Result Date: 09/06/2017 CLINICAL DATA:  Abdominal pain. Fever. Pulled gastrostomy tube out, subsequently replaced in the emergency department. EXAM: CT  ABDOMEN AND PELVIS WITH CONTRAST TECHNIQUE: Multidetector CT imaging of the abdomen and pelvis was performed using the standard protocol following bolus administration of intravenous contrast. CONTRAST:  ISOVUE-300 IOPAMIDOL (ISOVUE-300) INJECTION 61% COMPARISON:  05/07/2017 FINDINGS: Lower chest: Dependent subsegmental atelectasis in the lung bases. No pleural effusion. Hepatobiliary: 8 mm hypodensity in the right hepatic lobe, unchanged in size and too small to fully characterize. Unremarkable gallbladder. No biliary dilatation. Pancreas: Unremarkable. Spleen: Unchanged focal posterior splenic calcification. Adrenals/Urinary Tract: Unremarkable adrenal glands. No evidence of renal mass, calculi, or hydronephrosis. Unremarkable bladder. Stomach/Bowel: A gastrostomy tube is present in the body of the stomach. There is moderate soft tissue thickening/stranding and small volume unorganized fluid and/or hematoma adjacent to the tube in the anterior abdominal wall. Oral contrast is present in loops of small and large bowel to the level the rectum. There is swirling of the mesenteric vessels in the central pelvis without evidence of bowel obstruction. The appendix is not clearly identified, however no inflammatory changes are present in the right lower quadrant. Vascular/Lymphatic: Abdominal aortic atherosclerosis without aneurysm. No enlarged lymph nodes. Reproductive: Status post hysterectomy. No adnexal masses. Other: No ascites or pneumoperitoneum. Fat containing ventral epigastric hernia. Foci of soft tissue density and gas in the left lower quadrant abdominal wall, likely related to subcutaneous medication injection. Musculoskeletal: No acute osseous abnormality or suspicious osseous lesion. IMPRESSION: 1. Gastrostomy tube in place in the stomach. Soft tissue stranding and small volume fluid/hematoma around the tube in the abdominal wall. No evidence of abscess. 2. No other evidence of acute abnormality in  the abdomen or pelvis. 3.  Aortic Atherosclerosis (ICD10-I70.0). Electronically Signed   By: Sebastian Ache M.D.   On: 09/06/2017 08:09    Microbiology: Recent Results (from the past 240 hour(s))  Urine Culture     Status: None   Collection Time: 09/06/17  4:52 AM  Result Value Ref Range Status   Specimen Description   Final    URINE, CATHETERIZED Performed at [facility], [address], [place], [place] [address]    Special Requests   Final    NONE Performed at [facility], [address], [place], [place] [address]    Culture   Final    NO GROWTH Performed at [facility], [address] [place], [place] [address]    Report Status 09/07/2017 FINAL  Final  Blood culture (routine x 2)     Status: None (Preliminary result)   Collection Time: 09/06/17  5:29 AM  Result Value Ref Range Status   Specimen Description  Final    BLOOD LEFT WRIST Performed at [facility], [address] [place], [place] [address]    Special Requests   Final    BOTTLES DRAWN AEROBIC AND ANAEROBIC Blood Culture adequate volume Performed at [facility], [address] [place], [place] [address]    Culture   Final    NO GROWTH 1 DAY Performed at [facility], [address] [place], [place] [address]    Report Status PENDING  Incomplete  Blood culture (routine x 2)     Status: None (Preliminary result)   Collection Time: 09/06/17  5:48 AM  Result Value Ref Range Status   Specimen Description   Final    BLOOD RIGHT ARM Performed at [facility], [address] [place], [place] [address]    Special Requests   Final    BOTTLES DRAWN AEROBIC ONLY Blood Culture results may not be optimal due to an excessive volume of blood received in culture bottles Performed at [facility] [place] [facility], [address] [place], [place] [address]    Culture   Final    NO GROWTH 1 DAY Performed at  [facility], [address] [place], [place] [address]    Report Status PENDING  Incomplete     Labs: Basic Metabolic Panel: Recent Labs  Lab 09/06/17 0529 09/07/17 0636 09/08/17 0520  NA 141 141 141  K 4.2 3.8 4.1  CL 106 111 107  CO2 25 24 24   GLUCOSE 101* 119* 104*  BUN 17 11 14   CREATININE 0.94 0.76 0.59  CALCIUM 9.8 9.5 9.4   Liver Function Tests: Recent Labs  Lab 09/06/17 0529  AST 39  ALT 51*  ALKPHOS 95  BILITOT 0.8  PROT 7.9  ALBUMIN 4.0   No results for input(s): LIPASE, AMYLASE in the last 168 hours. No results for input(s): AMMONIA in the last 168 hours. CBC: Recent Labs  Lab 09/06/17 0529 09/07/17 0636 09/08/17 0520  WBC 11.1* 7.1 6.6  NEUTROABS 7.9*  --  3.9  HGB 12.2 11.1* 11.4*  HCT 37.9 35.6* 36.5  MCV 90.5 93.0 93.4  PLT 273 225 219   Cardiac Enzymes: No results for input(s): CKTOTAL, CKMB, CKMBINDEX, TROPONINI in the last 168 hours. BNP: BNP (last 3 results) Recent Labs    05/06/17 2023  BNP 53.0    ProBNP (last 3 results) No results for input(s): PROBNP in the last 8760 hours.  CBG: No results for input(s): GLUCAP in the last 168 hours.     Signed:  Briant CedarNkeiruka J Elenor Wildes, MD [facility] 09/08/2017, 9:21 AM

## 2017-09-08 NOTE — Progress Notes (Signed)
Patient returning to Rachel Vang today - CSW confirmed with facility and faxed documents, left voicemail to inform son Okey Dupre(Carlton Tomkiewicz), and called for PTAR. Patient returning to room 208A. No more needs identified, CSW signing off.  RN report #: [id number]     Enid CutterLindsey Brizza Nathanson, MSW, LCSWA Clinical Social Work [phone]

## 2017-09-08 NOTE — [place] FL2 (Signed)
[place] MEDICAID FL2 LEVEL OF CARE SCREENING TOOL     IDENTIFICATION  Patient Name: Rachel MinksStephanie N Trueba Birthdate: 10/21/1965 Sex: female Admission Date (Current Location): 09/06/2017  [facility] [place]   Facility and Address:  [facility],  [address], [place] [address]      Provider Number: [id number]  Attending Physician Name and Address:  Briant CedarEzenduka, Nkeiruka J, MD  Relative Name and Phone Number:  Okey DupreCarlton Montanye [phone]    Current Level of Care: Hospital Recommended Level of Care: Assisted Living Facility Prior Approval Number:    Date Approved/Denied:   PASRR Number: [id number] [id number]  Discharge Plan: SNF    Current Diagnoses: Patient Active Problem List   Diagnosis Date Noted  . Fever 09/06/2017  . Slow transit constipation 06/08/2017  . Insomnia 06/08/2017  . Generalized weakness 06/07/2017  . Aspiration pneumonia of both lower lobes due to gastric secretions (HCC)   . Demand ischemia (HCC)   . Chronic pain of right knee   . Chronic heel pain, right   . Small bowel obstruction (HCC)   . SVT (supraventricular tachycardia) (HCC)   . AKI (acute kidney injury) (HCC) 05/06/2017  . Hypernatremia 05/06/2017  . Dyslipidemia associated with type 2 diabetes mellitus (HCC) 04/27/2017  . Gingivitis, acute, plaque induced 03/26/2017  . Hypersecretion of saliva 03/14/2017  . Bruxism (teeth grinding) 03/14/2017  . Lobar pneumonia (HCC)   . Acute on chronic respiratory failure with hypoxia (HCC)   . Pressure injury of skin 02/16/2017  . SOB (shortness of breath)   . Severe sepsis (HCC) 02/15/2017  . Foot drop, right foot 10/18/2016  . CVA (cerebrovascular accident) (HCC) 08/30/2016  . GERD without esophagitis 08/30/2016  . Chronic pulmonary embolism (HCC) 07/03/2016  . Weight loss, non-intentional 06/13/2016  . Hemiparesis affecting right side as late effect of stroke (HCC) 09/25/2015  . Type II diabetes mellitus with neurological  manifestations (HCC) 05/20/2015  . Essential hypertension, benign 05/20/2015  . Status post insertion of percutaneous endoscopic gastrostomy (PEG) tube (HCC) 05/20/2015  . DVT (deep venous thrombosis) (HCC) 04/05/2015  . Protein-calorie malnutrition, severe (HCC) 03/25/2015  . Dysphagia 02/18/2015  . Seizures (HCC) 02/18/2015  . Elevated troponin 02/18/2015  . Urine retention 02/18/2015  . Sepsis (HCC) 02/17/2015  . History of ETT   . Subarachnoid hemorrhage (HCC)     Orientation RESPIRATION BLADDER Height & Weight     Self  Normal Incontinent Weight: 128 lb 1.4 oz (58.1 kg) Height:  5\' 1"  (154.9 cm)  BEHAVIORAL SYMPTOMS/MOOD NEUROLOGICAL BOWEL NUTRITION STATUS    Convulsions/Seizures Incontinent Feeding tube  AMBULATORY STATUS COMMUNICATION OF NEEDS Skin   Total Care Verbally                         Personal Care Assistance Level of Assistance  Bathing, Feeding, Dressing, Total care Bathing Assistance: Maximum assistance Feeding assistance: Limited assistance Dressing Assistance: Maximum assistance Total Care Assistance: Maximum assistance   Functional Limitations Info             SPECIAL CARE FACTORS FREQUENCY                       Contractures Contractures Info: Not present    Additional Factors Info  Code Status, Allergies Code Status Info: Full Allergies Info: NKA           Current Medications (09/08/2017):  This is the current hospital active medication list Current Facility-Administered  Medications  Medication Dose Route Frequency Provider Last Rate Last Dose  . 0.9 %  sodium chloride infusion   Intravenous PRN Shon Hale, MD   Stopped at 09/06/17 0737  . 0.9 %  sodium chloride infusion  250 mL Intravenous PRN Emokpae, Courage, MD      . acetaminophen (TYLENOL) tablet 650 mg  650 mg Oral Q6H PRN Emokpae, Courage, MD       Or  . acetaminophen (TYLENOL) suppository 650 mg  650 mg Rectal Q6H PRN Emokpae, Courage, MD      . albuterol  (PROVENTIL) (2.5 MG/3ML) 0.083% nebulizer solution 2.5 mg  2.5 mg Nebulization Q2H PRN Emokpae, Courage, MD      . bethanechol (URECHOLINE) tablet 10 mg  10 mg Per Tube TID Mariea Clonts, Courage, MD   10 mg at 09/08/17 1059  . enoxaparin (LOVENOX) injection 60 mg  60 mg Subcutaneous Q12H Emokpae, Courage, MD   60 mg at 09/08/17 1100  . feeding supplement (JEVITY 1.5 CAL/FIBER) liquid 1,000 mL  1,000 mL Per Tube Continuous Emokpae, Courage, MD 42 mL/hr at 09/07/17 1042 1,000 mL at 09/07/17 1042  . feeding supplement (PRO-STAT SUGAR FREE 64) liquid 30 mL  30 mL Per Tube Daily Emokpae, Courage, MD   30 mL at 09/08/17 1059  . free water 200 mL  200 mL Per Tube QID Emokpae, Courage, MD   200 mL at 09/08/17 1059  . gabapentin (NEURONTIN) 250 MG/5ML solution 200 mg  200 mg Per Tube BID Mariea Clonts, Courage, MD   200 mg at 09/08/17 1100  . hydrALAZINE (APRESOLINE) injection 10 mg  10 mg Intravenous Q6H PRN Emokpae, Courage, MD      . lactobacillus (FLORANEX/LACTINEX) granules 1 g  1 g Per Tube BID Mariea Clonts, Courage, MD   1 g at 09/08/17 1059  . levETIRAcetam (KEPPRA) 100 MG/ML solution 500 mg  500 mg Per Tube BID Mariea Clonts, Courage, MD   500 mg at 09/08/17 1100  . metoprolol tartrate (LOPRESSOR) tablet 25 mg  25 mg Oral BID Mariea Clonts, Courage, MD   25 mg at 09/08/17 1059  . multivitamin liquid 15 mL  15 mL Per Tube Daily Emokpae, Courage, MD   15 mL at 09/08/17 1100  . ondansetron (ZOFRAN) tablet 4 mg  4 mg Oral Q6H PRN Emokpae, Courage, MD       Or  . ondansetron (ZOFRAN) injection 4 mg  4 mg Intravenous Q6H PRN Emokpae, Courage, MD      . polyethylene glycol (MIRALAX / GLYCOLAX) packet 17 g  17 g Oral Daily PRN Emokpae, Courage, MD      . ranitidine (ZANTAC) 150 MG/10ML syrup 150 mg  150 mg Per Tube QHS Mariea Clonts, Courage, MD   150 mg at 09/07/17 2047  . rosuvastatin (CRESTOR) tablet 10 mg  10 mg Oral Daily Emokpae, Courage, MD   10 mg at 09/08/17 1059  . sertraline (ZOLOFT) tablet 100 mg  100 mg Oral Daily Emokpae,  Courage, MD   100 mg at 09/08/17 1059  . sodium chloride flush (NS) 0.9 % injection 3 mL  3 mL Intravenous Q12H Emokpae, Courage, MD   3 mL at 09/08/17 1059  . sodium chloride flush (NS) 0.9 % injection 3 mL  3 mL Intravenous PRN Emokpae, Courage, MD      . traZODone (DESYREL) tablet 50 mg  50 mg Per Tube QHS Mariea Clonts, Courage, MD   50 mg at 09/07/17 2048     Discharge Medications: acetaminophen 325 MG tablet Commonly  known as:  TYLENOL Place 650 mg into feeding tube every 4 (four) hours as needed for mild pain or fever.   bethanechol 10 MG tablet Commonly known as:  URECHOLINE Place 10 mg into feeding tube 3 (three) times daily.   enoxaparin 60 MG/0.6ML injection Commonly known as:  LOVENOX Inject 60 mg into the skin every 12 (twelve) hours.   feeding supplement (JEVITY 1.5 CAL) Liqd Place 42 mLs into feeding tube every hour. Continue with 42 cc per hour and flush with 200 cc every 4 hours   feeding supplement (PRO-STAT SUGAR FREE 64) Liqd Place 30 mLs into feeding tube daily.   free water Soln Place 200 mLs into feeding tube 4 (four) times daily.   gabapentin 100 MG capsule Commonly known as:  NEURONTIN Take 200 mg by mouth 2 (two) times daily. Per G-Tube HOLD IF LETHARGIC   levETIRAcetam 100 MG/ML solution Commonly known as:  KEPPRA Place 5 mLs (500 mg total) into feeding tube 2 (two) times daily.   metoprolol tartrate 25 MG tablet Commonly known as:  LOPRESSOR Take 25 mg by mouth 2 (two) times daily. Hold if SBP < 100 apical pulse   multivitamin tablet Place 1 tablet into feeding tube daily.   polyethylene glycol packet Commonly known as:  MIRALAX / GLYCOLAX Place 17 g into feeding tube daily.   Probiotic Caps Place 1 capsule into feeding tube 2 (two) times daily.   ranitidine 150 MG tablet Commonly known as:  ZANTAC Place 150 mg into feeding tube daily.   rosuvastatin 10 MG tablet Commonly known as:  CRESTOR Take 10 mg by mouth daily.    sertraline 100 MG tablet Commonly known as:  ZOLOFT Take 100 mg by mouth daily.   traZODone 50 MG tablet Commonly known as:  DESYREL Place 50 mg into feeding tube at bedtime.      Relevant Imaging Results:  Relevant Lab Results:   Additional Information SSN: [id number]  Enid Cutter, LCSW

## 2017-09-09 ENCOUNTER — Emergency Department (HOSPITAL_COMMUNITY)
Admission: EM | Admit: 2017-09-09 | Discharge: 2017-09-10 | Disposition: A | Discharge status: Skilled Nursing Facility | Payer: Medicaid Other | Attending: Emergency Medicine | Admitting: Emergency Medicine

## 2017-09-09 ENCOUNTER — Other Ambulatory Visit: Payer: Self-pay

## 2017-09-09 ENCOUNTER — Telehealth: Payer: Self-pay

## 2017-09-09 ENCOUNTER — Encounter (HOSPITAL_COMMUNITY): Payer: Self-pay

## 2017-09-09 DIAGNOSIS — I10 Essential (primary) hypertension: Secondary | ICD-10-CM | POA: Insufficient documentation

## 2017-09-09 DIAGNOSIS — Z87891 Personal history of nicotine dependence: Secondary | ICD-10-CM | POA: Diagnosis not present

## 2017-09-09 DIAGNOSIS — E119 Type 2 diabetes mellitus without complications: Secondary | ICD-10-CM | POA: Insufficient documentation

## 2017-09-09 DIAGNOSIS — Z79899 Other long term (current) drug therapy: Secondary | ICD-10-CM | POA: Insufficient documentation

## 2017-09-09 DIAGNOSIS — K9420 Gastrostomy complication, unspecified: Secondary | ICD-10-CM | POA: Diagnosis present

## 2017-09-09 DIAGNOSIS — Z7901 Long term (current) use of anticoagulants: Secondary | ICD-10-CM | POA: Diagnosis not present

## 2017-09-09 MED ORDER — PANCRELIPASE (LIP-PROT-AMYL) 12000-38000 UNITS PO CPEP
12000.0000 [IU] | ORAL_CAPSULE | Freq: Once | ORAL | Status: AC
  Administered 2017-09-09: 12000 [IU] via ORAL
  Filled 2017-09-09: qty 1

## 2017-09-09 NOTE — ED Provider Notes (Signed)
[facility]-EMERGENCY DEPT Provider Note   CSN: [id number] Arrival date & time: 09/09/17  2126     History   Chief Complaint Chief Complaint  Patient presents with  . peg tube clogged    HPI Rachel Vang is a 52 y.o. female.  Rachel Vang is a 52 y.o. Female who presents from [place] via EMS for evaluation of clogged PEG tube, staff at facility hesitant to flush the tube themselves and doctor sent patient here for further evaluation.  Problem has been constant and not improving since onset.  Nothing done prior to arrival to try and unclog the tube.  Patient is nonverbal but able to answer yes or no questions, and reported that facility has not been flushing the tube routinely after feeds.  Chart review reveals tube was last replaced on 8/9 in the ED after it became dislodged, at this time patient was admitted due to sepsis, no complications with the tube until today when the facility attempted to provide feet the medications are and were unable to pass anything through the tube.  Patient denies any fevers or chills, no abdominal pain, nausea or vomiting.  No chest pain or shortness of breath.     Past Medical History:  Diagnosis Date  . Acute pulmonary embolism (HCC) 02/19/2015  . Acute respiratory failure (HCC)   . Diabetes mellitus without complication (HCC)    Type 2, W/o complications  . DVT (deep venous thrombosis) (HCC) 04/05/2015  . Dysphagia   . Epilepsy (HCC)   . GERD (gastroesophageal reflux disease)   . Hyperlipidemia   . Hypertension   . IBS (irritable bowel syndrome)   . Nontraumatic subarachnoid hemorrhage (HCC)   . SAH (subarachnoid hemorrhage) (HCC)   . Urinary retention     Patient Active Problem List   Diagnosis Date Noted  . Fever 09/06/2017  . Slow transit constipation 06/08/2017  . Insomnia 06/08/2017  . Generalized weakness 06/07/2017  . Aspiration pneumonia of both lower lobes due to gastric secretions (HCC)     . Demand ischemia (HCC)   . Chronic pain of right knee   . Chronic heel pain, right   . Small bowel obstruction (HCC)   . SVT (supraventricular tachycardia) (HCC)   . AKI (acute kidney injury) (HCC) 05/06/2017  . Hypernatremia 05/06/2017  . Dyslipidemia associated with type 2 diabetes mellitus (HCC) 04/27/2017  . Gingivitis, acute, plaque induced 03/26/2017  . Hypersecretion of saliva 03/14/2017  . Bruxism (teeth grinding) 03/14/2017  . Lobar pneumonia (HCC)   . Acute on chronic respiratory failure with hypoxia (HCC)   . Pressure injury of skin 02/16/2017  . SOB (shortness of breath)   . Severe sepsis (HCC) 02/15/2017  . Foot drop, right foot 10/18/2016  . CVA (cerebrovascular accident) (HCC) 08/30/2016  . GERD without esophagitis 08/30/2016  . Chronic pulmonary embolism (HCC) 07/03/2016  . Weight loss, non-intentional 06/13/2016  . Hemiparesis affecting right side as late effect of stroke (HCC) 09/25/2015  . Type II diabetes mellitus with neurological manifestations (HCC) 05/20/2015  . Essential hypertension, benign 05/20/2015  . Status post insertion of percutaneous endoscopic gastrostomy (PEG) tube (HCC) 05/20/2015  . DVT (deep venous thrombosis) (HCC) 04/05/2015  . Protein-calorie malnutrition, severe (HCC) 03/25/2015  . Dysphagia 02/18/2015  . Seizures (HCC) 02/18/2015  . Elevated troponin 02/18/2015  . Urine retention 02/18/2015  . Sepsis (HCC) 02/17/2015  . History of ETT   . Subarachnoid hemorrhage [facility])     Past Surgical History:  Procedure Laterality Date  . ABDOMINAL SURGERY    . ANEURYSM COILING    . COLONOSCOPY N/A 02/20/2015   Procedure: COLONOSCOPY;  Surgeon: Iva Boop, MD;  Location: [facility] ENDOSCOPY;  Service: Endoscopy;  Laterality: N/A;  . ESOPHAGOGASTRODUODENOSCOPY (EGD) WITH PROPOFOL N/A 02/02/2015   Procedure: ESOPHAGOGASTRODUODENOSCOPY (EGD) WITH PROPOFOL;  Surgeon: Jimmye Norman, MD;  Location: [facility] ENDOSCOPY;  Service: General;  Laterality: N/A;  . IR  GENERIC HISTORICAL  08/26/2015   IR GASTRIC TUBE PERC CHG W/O IMG GUIDE 08/26/2015 Irish Lack, MD [facility]-INTERV RAD  . IR GENERIC HISTORICAL  09/14/2015   IR REPLC GASTRO/COLONIC TUBE PERCUT W/FLUORO 09/14/2015 Darrell K Allred, PA-C [facility]-INTERV RAD  . IR REPLACE G-TUBE SIMPLE WO FLUORO  06/08/2016  . IR REPLACE G-TUBE SIMPLE WO FLUORO  10/24/2016  . IR REPLACE G-TUBE SIMPLE WO FLUORO  01/07/2017  . PEG PLACEMENT N/A 02/02/2015   Procedure: PERCUTANEOUS ENDOSCOPIC GASTROSTOMY (PEG) PLACEMENT;  Surgeon: Jimmye Norman, MD;  Location: [facility] ENDOSCOPY;  Service: General;  Laterality: N/A;  . RADIOLOGY WITH ANESTHESIA N/A 01/13/2015   Procedure: RADIOLOGY WITH ANESTHESIA;  Surgeon: Lisbeth Renshaw, MD;  Location: [facility] OR;  Service: Radiology;  Laterality: N/A;  . TRACHEOSTOMY       OB History   None      Home Medications    Prior to Admission medications   Medication Sig Start Date End Date Taking? Authorizing Provider  Amino Acids-Protein Hydrolys (FEEDING SUPPLEMENT, PRO-STAT SUGAR FREE 64,) LIQD Place 30 mLs into feeding tube daily.   Yes [provider]  enoxaparin (LOVENOX) 60 MG/0.6ML injection Inject 60 mg into the skin every 12 (twelve) hours.   Yes [provider]  gabapentin (NEURONTIN) 100 MG capsule Take 200 mg by mouth 2 (two) times daily. Per G-Tube HOLD IF LETHARGIC 04/04/17  Yes Ellwood Sayers, MD  levETIRAcetam (KEPPRA) 100 MG/ML solution Place 5 mLs (500 mg total) into feeding tube 2 (two) times daily. 02/11/15  Yes Leroy Sea, MD  metoprolol tartrate (LOPRESSOR) 25 MG tablet Take 25 mg by mouth 2 (two) times daily. Hold if SBP < 100 apical pulse   Yes [provider]  Multiple Vitamin (MULTIVITAMIN) tablet Place 1 tablet into feeding tube daily.    Yes [provider]  Nutritional Supplements (FEEDING SUPPLEMENT, JEVITY 1.5 CAL,) LIQD Place 42 mLs into feeding tube every hour. Continue with 42 cc per hour and flush with 200 cc every 4 hours     Yes [provider]  polyethylene glycol (MIRALAX / GLYCOLAX) packet Place 17 g into feeding tube daily.    Yes [provider]  ranitidine (ZANTAC) 150 MG tablet Place 150 mg into feeding tube daily.  04/25/17  Yes [provider]  rosuvastatin (CRESTOR) 10 MG tablet Take 10 mg by mouth daily.   Yes [provider]  sertraline (ZOLOFT) 100 MG tablet Place 100 mg into feeding tube daily.  07/12/17  Yes [provider]  traZODone (DESYREL) 50 MG tablet Place 50 mg into feeding tube at bedtime.   Yes [provider]  Water For Irrigation, Sterile (FREE WATER) SOLN Place 200 mLs into feeding tube 4 (four) times daily. 02/22/17   Calvert Cantor, MD    Family History Family History  Problem Relation Age of Onset  . Hypertension Other     Social History Social History   Tobacco Use  . Smoking status: Former Games developer  . Smokeless tobacco: Never Used  Substance Use Topics  . Alcohol use: No  .  Drug use: No     Allergies   Patient has no known allergies.   Review of Systems Review of Systems  Constitutional: Negative for chills and fever.  Respiratory: Negative for cough and shortness of breath.   Cardiovascular: Negative for chest pain.  Gastrointestinal: Negative for abdominal pain, nausea and vomiting.  Skin: Negative for color change, rash and wound.  All other systems reviewed and are negative.    Physical Exam Updated Vital Signs BP 118/78 (BP Location: Left Arm)   Pulse 67   Temp 98.4 F (36.9 C) (Oral)   Resp 16   Ht 5\' 1"  (1.549 m)   Wt 58.1 kg   LMP  (LMP Unknown)   SpO2 100%   BMI 24.20 kg/m   Physical Exam  Constitutional: She appears well-developed and well-nourished. No distress.  HENT:  Head: Normocephalic and atraumatic.  Eyes: Right eye exhibits no discharge. Left eye exhibits no discharge.  Cardiovascular: Normal rate, regular rhythm, normal heart sounds and intact distal pulses.  Pulmonary/Chest:  Effort normal and breath sounds normal. No respiratory distress.  Respirations equal and unlabored, patient able to speak in full sentences, lungs clear to auscultation bilaterally  Abdominal: Soft. Bowel sounds are normal. She exhibits no distension. There is no tenderness. There is no guarding.  Abdomen soft, nondistended, bowel sounds present throughout, PEG tube in place, no surrounding erythema and stoma appears healthy, unable to flush PEG tube, abdomen nontender to palpation throughout without guarding  Musculoskeletal: She exhibits no edema or deformity.  Neurological: She is alert. Coordination normal.  Skin: Skin is warm and dry. Capillary refill takes less than 2 seconds. She is not diaphoretic.  Psychiatric: She has a normal mood and affect. Her behavior is normal.  Nursing note and vitals reviewed.    ED Treatments / Results  Labs (all labs ordered are listed, but only abnormal results are displayed) Labs Reviewed - No data to display  EKG None  Radiology Dg Abdomen 1 View  Result Date: 09/10/2017 CLINICAL DATA:  Confirmation of PEG tube placement. 30 mL Omnipaque given. EXAM: ABDOMEN - 1 VIEW COMPARISON:  09/06/2017 FINDINGS: Gastrostomy tube is present. Contrast material is demonstrated in the stomach suggesting appropriate location of the tube within the stomach. No contrast extravasation is demonstrated. Scattered gas and stool throughout the colon with gas-filled nondistended small bowel. No radiopaque stones. Degenerative changes in the spine and hips. Vascular calcifications. IMPRESSION: Gastrostomy tube appears to be in the stomach. No contrast extravasation. Electronically Signed   By: Burman NievesWilliam  Stevens M.D.   On: 09/10/2017 05:00    Procedures Procedures (including critical care time)  Medications Ordered in ED Medications  lipase/protease/amylase (CREON) capsule 12,000 Units (12,000 Units Oral Given 09/09/17 2235)  iohexol (OMNIPAQUE) 300 MG/ML solution 30 mL (30  mLs Oral Contrast Given 09/10/17 0448)     Initial Impression / Assessment and Plan / ED Course  I have reviewed the triage vital signs and the nursing notes.  Pertinent labs & imaging results that were available during my care of the patient were reviewed by me and considered in my medical decision making (see chart for details).  Pt presents with clogged PEG tube, PEG tube has been in place since 2017.  Facility unable to flush or give any medications.  Several attempts made by nursing and provider to unclog PEG tube with coke, as well as Creon enzymes, and dilator tools.  Discussed with Dr. Ezzard StandingNewman with general surgery who recommends changing out PEG tube.  PEG tube exchange for new 16 French tube, patient tolerated well without complication.  New to flushes and draws back with these.  Placement confirmed with Gastrografin and KUB which shows tube is within the stomach and there is no contrast extra radiation.  At this time patient is stable for discharge home, tube is ready for use for feeds and medication, provided instructions for facility on ensuring to be flushed after each use.  Return precautions discussed.  Patient will be transported back to facility via [facility].  Final Clinical Impressions(s) / ED Diagnoses   Final diagnoses:  Problem with gastrostomy tube [facility](HCC)    ED Discharge Orders    None       Dartha LodgeFord, Kaeson Kleinert N, [place]PA-C 09/10/17 [id number]    Nira Connardama, Pedro Eduardo, MD 09/10/17 [phone]

## 2017-09-09 NOTE — ED Notes (Signed)
Attempted to clear tube with Diet coke. Attempt unsuccessful. Provider made aware.

## 2017-09-09 NOTE — Telephone Encounter (Signed)
Possible re-admission to facility. [facility] if patient was re-admitted to facility upon discharge. [facility] 09[facility]08/2017

## 2017-09-09 NOTE — ED Triage Notes (Signed)
[facility] by [facility] due [facility] Employee did not want to flush tube due to a hx of a blood clot. [facility] order to flush the tube.

## 2017-09-09 NOTE — ED Notes (Signed)
Provider made aware that tube is not clear after letting Creon sit in tube for 30 minutes.

## 2017-09-09 NOTE — ED Triage Notes (Signed)
Pt from a facility with a clogged peg tube and the facility wasn't comfortable unclogging it

## 2017-09-10 ENCOUNTER — Telehealth: Payer: Self-pay

## 2017-09-10 ENCOUNTER — Emergency Department (HOSPITAL_COMMUNITY): Payer: Medicaid Other

## 2017-09-10 ENCOUNTER — Encounter: Payer: Self-pay | Admitting: Adult Health

## 2017-09-10 ENCOUNTER — Non-Acute Institutional Stay (SKILLED_NURSING_FACILITY): Payer: Medicaid Other | Admitting: Adult Health

## 2017-09-10 DIAGNOSIS — I10 Essential (primary) hypertension: Secondary | ICD-10-CM | POA: Diagnosis not present

## 2017-09-10 DIAGNOSIS — I609 Nontraumatic subarachnoid hemorrhage, unspecified: Secondary | ICD-10-CM

## 2017-09-10 DIAGNOSIS — R1314 Dysphagia, pharyngoesophageal phase: Secondary | ICD-10-CM | POA: Diagnosis not present

## 2017-09-10 DIAGNOSIS — I471 Supraventricular tachycardia, unspecified: Secondary | ICD-10-CM

## 2017-09-10 DIAGNOSIS — I639 Cerebral infarction, unspecified: Secondary | ICD-10-CM | POA: Diagnosis not present

## 2017-09-10 DIAGNOSIS — I69359 Hemiplegia and hemiparesis following cerebral infarction affecting unspecified side: Secondary | ICD-10-CM

## 2017-09-10 DIAGNOSIS — E1169 Type 2 diabetes mellitus with other specified complication: Secondary | ICD-10-CM

## 2017-09-10 DIAGNOSIS — E785 Hyperlipidemia, unspecified: Secondary | ICD-10-CM

## 2017-09-10 MED ORDER — DIATRIZOATE MEGLUMINE & SODIUM 66-10 % PO SOLN
30.0000 mL | Freq: Once | ORAL | Status: DC
  Filled 2017-09-10: qty 30

## 2017-09-10 MED ORDER — IOHEXOL 300 MG/ML  SOLN
30.0000 mL | Freq: Once | INTRAMUSCULAR | Status: AC | PRN
  Administered 2017-09-10: 30 mL via ORAL

## 2017-09-10 NOTE — Discharge Instructions (Signed)
Gastrostomy tube was unable to be unclogged here in the emergency department so was replaced with a new tube.  X-ray shows it is in the correct place, and it is easily flushing here in the emergency department may resume tube feeding and medications through this tube.  Please make sure that the tube is flushed after every feed with water to help prevent re-clogging of the tube.

## 2017-09-10 NOTE — ED Provider Notes (Signed)
Medical screening examination/treatment/procedure(s) were conducted as a shared visit with non-physician practitioner(s) and myself.  I personally evaluated the patient during the encounter. Briefly, the patient is a 52 y.o. female with a history of subarachnoid hemorrhage, dysphagia requiring PEG tube who presents to the emergency department with a clogged PEG tube.  Attempts to unclog were unsuccessful.  PEG tube replaced in the emergency department.  Placement confirmed with contrast KUB..    EKG Interpretation None       Patient is nonverbal, but gave nonverbal consent.  Gastrostomy tube replacement Date/Time: 09/10/2017 5:05 AM Performed by: Nira Connardama, Pedro Eduardo, MD Authorized by: Nira Connardama, Pedro Eduardo, MD  Consent given by: patient Patient understanding: patient states understanding of the procedure being performed Patient identity confirmed: arm band Local anesthesia used: no  Anesthesia: Local anesthesia used: no  Sedation: Patient sedated: no  Patient tolerance: Patient tolerated the procedure well with no immediate complications       Cardama, Amadeo GarnetPedro Eduardo, MD 09/10/17 [phone]

## 2017-09-10 NOTE — Telephone Encounter (Signed)
Possible re-admission to facility. [facility] if patient was re-admitted to facility upon discharge. [facility]

## 2017-09-10 NOTE — Progress Notes (Signed)
Location:   [facility] Room Number: 208 A Place of Service:  SNF (31)   CODE STATUS: Full code (Most form updated 08/21/16)  No Known Allergies  Chief Complaint  Patient presents with  . Hospitalization Follow-up    Hospital follow up    HPI:  She is a 52 year old long term resident of this facility who has been hospitalized from 09-06-17 through 09-08-17. She has been pulling her tube feeding apart and pulling out her peg tube. She went to the ED due to her pulling out her peg tube. She did have fever and slightly elevated wbc. She was worked up for infection and none was found. She is unable to fully participate in the hpi or ros. She did nod her head when I asked if she wanted the feeding to stop. I am not certain she really understands what I asked. There are no reports of uncontrolled pain; no agitation; is tolerating her tube feeding without difficulty. She will continue to be followed for her chronic illnesses including: cva; seizures; hypertension. There are no nursing concerns at this time.   Past Medical History:  Diagnosis Date  . Acute pulmonary embolism ([place]) 02/19/2015  . Acute respiratory failure ([place])   . Diabetes mellitus without complication (HCC)    Type 2, W/o complications  . DVT (deep venous thrombosis) ([place]) 04/05/2015  . Dysphagia   . Epilepsy ([place])   . GERD (gastroesophageal reflux disease)   . Hyperlipidemia   . Hypertension   . IBS (irritable bowel syndrome)   . Nontraumatic subarachnoid hemorrhage ([place])   . SAH (subarachnoid hemorrhage) ([place])   . Urinary retention     Past Surgical History:  Procedure Laterality Date  . ABDOMINAL SURGERY    . ANEURYSM COILING    . COLONOSCOPY N/A 02/20/2015   Procedure: COLONOSCOPY;  Surgeon: Gatha Mayer, MD;  Location: [facility];  Service: Endoscopy;  Laterality: N/A;  . ESOPHAGOGASTRODUODENOSCOPY (EGD) WITH PROPOFOL N/A 02/02/2015   Procedure: ESOPHAGOGASTRODUODENOSCOPY (EGD) WITH PROPOFOL;   Surgeon: Judeth Horn, MD;  Location: [facility];  Service: General;  Laterality: N/A;  . IR GENERIC HISTORICAL  08/26/2015   IR GASTRIC TUBE PERC CHG W/O IMG GUIDE 08/26/2015 Aletta Edouard, MD [facility]  . IR GENERIC HISTORICAL  09/14/2015   IR REPLC GASTRO/COLONIC TUBE PERCUT W/FLUORO 09/14/2015 Darrell K Allred, PA-C [facility]  . IR REPLACE G-TUBE SIMPLE WO FLUORO  06/08/2016  . IR REPLACE G-TUBE SIMPLE WO FLUORO  10/24/2016  . IR REPLACE G-TUBE SIMPLE WO FLUORO  01/07/2017  . PEG PLACEMENT N/A 02/02/2015   Procedure: PERCUTANEOUS ENDOSCOPIC GASTROSTOMY (PEG) PLACEMENT;  Surgeon: Judeth Horn, MD;  Location: [facility];  Service: General;  Laterality: N/A;  . RADIOLOGY WITH ANESTHESIA N/A 01/13/2015   Procedure: RADIOLOGY WITH ANESTHESIA;  Surgeon: Consuella Lose, MD;  Location: [facility];  Service: Radiology;  Laterality: N/A;  . TRACHEOSTOMY      Social History   Socioeconomic History  . Marital status: Single    Spouse name: Not on file  . Number of children: Not on file  . Years of education: Not on file  . Highest education level: Not on file  Occupational History  . Not on file  Social Needs  . Financial resource strain: Not on file  . Food insecurity:    Worry: Not on file    Inability: Not on file  . Transportation needs:    Medical: Not on file    Non-medical: Not  on file  Tobacco Use  . Smoking status: Former Research scientist (life sciences)  . Smokeless tobacco: Never Used  Substance and Sexual Activity  . Alcohol use: No  . Drug use: No  . Sexual activity: Not on file  Lifestyle  . Physical activity:    Days per week: Not on file    Minutes per session: Not on file  . Stress: Not on file  Relationships  . Social connections:    Talks on phone: Not on file    Gets together: Not on file    Attends religious service: Not on file    Active member of club or organization: Not on file    Attends meetings of clubs or organizations: Not on file    Relationship status: Not on file    . Intimate partner violence:    Fear of current or ex partner: Not on file    Emotionally abused: Not on file    Physically abused: Not on file    Forced sexual activity: Not on file  Other Topics Concern  . Not on file  Social History Narrative  . Not on file   Family History  Problem Relation Age of Onset  . Hypertension Other       VITAL SIGNS BP 127/64   Pulse 74   Temp 98.1 F (36.7 C)   Resp 18   Ht _0  (1.651 m)   Wt 117 lb (53.1 kg)   LMP  (LMP Unknown)   SpO2 96%   BMI 19.47 kg/m   Outpatient Encounter Medications as of 09/10/2017  Medication Sig  . acetaminophen (TYLENOL) 325 MG tablet Place 650 mg into feeding tube every 4 (four) hours as needed for mild pain or fever.  . Amino Acids-Protein Hydrolys (FEEDING SUPPLEMENT, PRO-STAT SUGAR FREE 64,) LIQD Place 30 mLs into feeding tube daily.  . bethanechol (URECHOLINE) 10 MG tablet Take 10 mg by mouth 3 (three) times daily.  [place] Kitchen enoxaparin (LOVENOX) 60 MG/0.6ML injection Inject 60 mg into the skin every 12 (twelve) hours.  . gabapentin (NEURONTIN) 100 MG capsule Give 200 mg Per G-Tube two times daily HOLD IF LETHARGIC  . levETIRAcetam (KEPPRA) 100 MG/ML solution Place 5 mLs (500 mg total) into feeding tube 2 (two) times daily.  . metoprolol tartrate (LOPRESSOR) 25 MG tablet Take 25 mg by mouth 2 (two) times daily. Hold if SBP < 100 apical pulse  . Multiple Vitamin (MULTIVITAMIN) tablet Place 1 tablet into feeding tube daily.   . Nutritional Supplements (FEEDING SUPPLEMENT, JEVITY 1.5 CAL,) LIQD Place 42 mLs into feeding tube every hour. Continue with 42 cc per hour and flush with 200 cc every 4 hours   . polyethylene glycol (MIRALAX / GLYCOLAX) packet Place 17 g into feeding tube daily.   . Probiotic Product (PROBIOTIC DAILY PO) Place 1 capsule into feeding tube 2 (two) times daily.  . ranitidine (ZANTAC) 150 MG tablet Place 150 mg into feeding tube daily.   . rosuvastatin (CRESTOR) 10 MG tablet Take 10 mg by  mouth daily.  . sertraline (ZOLOFT) 100 MG tablet Place 100 mg into feeding tube daily.   . traZODone (DESYREL) 50 MG tablet Place 50 mg into feeding tube at bedtime.  . Water For Irrigation, Sterile (FREE WATER) SOLN Place 200 mLs into feeding tube 4 (four) times daily.   No facility-administered encounter medications on file as of 09/10/2017.      SIGNIFICANT DIAGNOSTIC EXAMS   PREVIOUS  06-14-16: diagnostic right mammogram: benign cyst  05-06-17: ct angio of chest: 1. No pulmonary embolus identified. 2. Diffuse peribronchial thickening with debris in the mainstem bronchi and extending into the lower lobes. Findings may represent acute bronchitis or aspiration given the distribution. No consolidation. 3. Mildly patulous esophagus. 4. Aorta and coronary artery calcific atherosclerosis. 05-08-17: 2-d echo: - Left ventricle: The cavity size was normal. There was mild concentric hypertrophy. Systolic function was vigorous. The estimated ejection fraction was in the range of 65% to 70%. Wall motion was normal; there were no regional wall motion abnormalities. Doppler parameters are consistent with abnormal left ventricular relaxation (grade 1 diastolic dysfunction). There was no evidence of elevated ventricular filling pressure by  Doppler parameters. - Aortic valve: There was no regurgitation. - Aortic root: The aortic root was normal in size. - Mitral valve: There was no regurgitation. - Left atrium: The atrium was normal in size. - Right ventricle: Systolic function was normal. - Right atrium: The atrium was normal in size. - Pulmonic valve: There was no regurgitation. - Inferior vena cava: The vessel was normal in size. - Pericardium, extracardiac: A mild anteriorly located pericardial effusion was identified. Features were not consistent with  tamponade physiology.  05-09-17: right foot x-ray: 1. No radiographic findings of osteomyelitis. 2. Osteoporosis.  05-12-17: MRI right knee: 1.  Incomplete study as only three axial sequences were obtained. The patient refused further imaging due to pain. 2. No joint effusion, excluding septic arthritis. 3. 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 No evidence of osteomyelitis.  05-13-17: MRI right ankle: 1. No osteomyelitis of right ankle. 2. No MRI evidence of septic arthritis of the right ankle. 3. Soft tissue ulcer overlying the posterior calcaneus.  TODAY:   06-17-17: mammogram: no evidence of malignancy  09-06-17: kub:  Contrast injected into the G-tube is noted filling the antrum of the stomach and proximal duodenum as expected.  09-06-17: chest x-ray: No acute cardiopulmonary process seen.  09-06-17: ct of abdomen and pelvis:  1. Gastrostomy tube in place in the stomach. Soft tissue strandingand small volume fluid/hematoma around the tube in the abdominalwall. No evidence of abscess. 2. No other evidence of acute abnormality in the abdomen or pelvis. 3.  Aortic Atherosclerosis    LABS REVIEWED: PREVIOUS   10-11-16: glucose 133; bun 25.6; creat 0.63; k+ 4.8; na++ 140; ca 9.9; liver normal albumin 4.3 10-22-16: glucose 131; bun 29.0; creat 0.62; k+ 4.7; na++ 141; ca 9.8; liver normal albumin 4.3 01-06-17: wb 8.1; hgb 15.0; hct 45.0; mcv  93.2; plt 199; glucose 99; bun 20; creat 0.74; k+ 4.0; na++ 142; ca 10.7  01-25-17: hgb a1c 6.1; chol 187; ldl 117; trig 184; hdl 31  02-14-17: wbc 28.0; hgb 13.9; hct 41.0; mcv 90.5 ;plt 297  glucose 165; bun 34.4; creat 1.04; k+ 4.1; NA++ 156; ALT 38 AST 22; alk phos  133; albumin 4.2 blood culture: no growth urine culture no growth 02-14-17 (ED):  Wbc 33.7; hgb 13.4; hct 42.8; mcv 99.5; plt 279 glucose 210; bun 41; creat 1.37; k+ 3.6; na++ 155; ca 9.9; liver normal albumin 3.4  02-15-17: blood culture: no growth; flu: neg HIV: nr; hgb a1c 6.1 02-17-17: wbc 18.4; hgb 9.4; hct 31.6; mcv 97.8; plt 214; glucose 100; bun 23; creat 0.75; k+ 3.2; na++150 liver normal albumin 2.4 mag 2.3 phos 3.0  02-22-17:  wbc 13.3; hgb 10.5; hct 33.6; mcv 94.6; plt 424; glucose 102; bun 16; creat 0.58; k+ 4.0; na++ 138; ca 9.9 02-24-17: wbc  11.2; hgb 11.2; hct  35.2; mcv 95.7; plt 387; glucose 115; bun 11; creat 0.57; k+ 4.2; na++ 138; ca 9.4 05-06-17: wbc 16.7; hgb 14.5; hct  47.9; mcv 96.8; plt 306; glucose 251; bun 68; creat 2.68; k+ 3.5; na++ 154; ca 9.1; blood culture: MRSA: urine culture: no growth 05-07-17: glucose 109; bun 60; creat 2.10; k+ 4.7; na++ 158; ca 7.4 ;liver normal albumin 2.3 05-09-17: wbc 10.0; hgb 11.8; hct 37.7; mcv 92.9; plt 210; glucose 121; bun 7; creat 0.77; k+ 3.0; na++ 142; ca 9.0; blood culture: no growth 05-10-17: wbc 8.5; hgb 11.0; hct 35.1; mcv 92.4; plt 214; glucose 120; bun 6; creat 0.87; k+ 3.2; na++ 143; ca 9.2; hepatitis: neg 05-12-17: wbc 8.9; hgb 11.6; hct 36.0; mcv 91.4; plt 231; glucose 113; bun 8; creat 0.70; k+ 3.6; na++ 141; ca 9.3 05-29-17: chol 80; ldl 31; trig 108; hdl 21; hgb a1c 6.1    TODAY:   06-03-17: wbc 8,8l hgb 12.1; hct 38; plt 209; glucose 119 bun 18; creat 0.6; k+ 5.2; na++147 chol 112; ldl 58; trig 112; hdl 29  08-08-17: wbc 9.0; hgb 12.4; hct 39.7; mcv 91.9; plt 290; glucose 105; bun 11; creat 0.83;  k+ 3.7; na++ 142 ca 10.4; liver normal albumin 3.8  09-06-17: wbc 1.1; hgb 12.2; hct 37.9; mcv 90.5; plt 273 glucose 101; bun 17; creat 0.94; k+ 4.2; na++ 141; liver normal albumin 4.0    urine culture no growth blood culture no growth 09-08-17: wbc  6.6; hgb 11.4; hct 36.5; mcv 93.4; plt 219; glucose 104; bun 14; creat 0.59; k+ 4.1; na++ 141; ca 9.4    Review of Systems  Unable to perform ROS: Other (cva: nonverbal )     Physical Exam  Constitutional: She appears well-developed and well-nourished. No distress.  Neck: Carotid bruit is present. No thyromegaly present.  Bilateral   Cardiovascular: Normal rate, regular rhythm, normal heart sounds and intact distal pulses.  Pulmonary/Chest: Effort normal and breath sounds normal. No respiratory distress.  Abdominal:  Soft. Bowel sounds are normal. She exhibits no distension. There is no tenderness.  Peg tube present without signs of infection present   Musculoskeletal: She exhibits no edema.  Left hemiplegia   Lymphadenopathy:    She has no cervical adenopathy.  Neurological: She is alert.  Skin: Skin is warm and dry. She is not diaphoretic.  Psychiatric: She has a normal mood and affect.    ASSESSMENT/ PLAN:  TODAY  1. Essential benign hypertension with supraventricular tachycardia:  stable: b/p 127/64  pulse: 74: will continue lopressor 25 mg twice daily   2.  Dysphagia paraesophageal  phase : stable  no signs of aspiration present she is npo; she is dependent upon peg tube feeding.  3. 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  Will change the dose due to her lower body weight to 50 mg twice daily   4. Dyslipidemia associated with type 2 diabetes mellitus: stable ldl 58  continue crestor 10 mg daily will monitor   PREVIOUS   5. gerd without esophagitis: stable will continue zantac 150 mg daily   6. Slow transit constipation: is status post SBO: will continue miralax 17 gm daily   7. Insomnia: is stable will continue trazodone 50 mg nightly   8. Urine retention: stable will continue urecholine 10 mg three times daily  ; will not make changes will not make changes.   9. Type 2 diabetes mellitus with  neurological manifestations: is stable  hgb a1c is 6.1 (previous 5.4) ; is currently off medications; will continue to monitor her status.  ldl is 85 is on statin.   10. Seizures: is stable  no reports of seizure activity present: will continue keppra 500 mg twice daily and will monitor   11. DVT (chronic left lower extremity) and chronic pulmonary embolism: is stable  she does require long term anticoagulation therapy; will continue lovenox 60  mg twice daily (weigt based); is not a candidate for  xarelto; or eliquis due to her history of GI bleed; she is not appropriate for coumadin therapy; unable  to obtain adequate INR.  12. Weight loss: is worse; her current weight is 117 pounds. She has been pulling her tube feeding apart and has been pulling her peg tube out. We will need to have a care plan meeting to with her family to discuss future plans.   MD is aware of resident's narcotic use and is in agreement with current plan of care. We will attempt to wean resident as apropriate   Ok Edwards NP [facility]  Contact [phone] Monday through Friday 8am- 5pm  After hours call [phone]

## 2017-09-10 NOTE — ED Notes (Signed)
[facility] called for transport.  

## 2017-09-11 ENCOUNTER — Emergency Department (HOSPITAL_COMMUNITY)
Admission: EM | Admit: 2017-09-11 | Discharge: 2017-09-11 | Disposition: A | Discharge status: Home / Self Care | Payer: Medicaid Other | Attending: Emergency Medicine | Admitting: Emergency Medicine

## 2017-09-11 ENCOUNTER — Encounter (HOSPITAL_COMMUNITY): Payer: Self-pay | Admitting: Emergency Medicine

## 2017-09-11 ENCOUNTER — Emergency Department (HOSPITAL_COMMUNITY): Payer: Medicaid Other

## 2017-09-11 DIAGNOSIS — Z87891 Personal history of nicotine dependence: Secondary | ICD-10-CM | POA: Insufficient documentation

## 2017-09-11 DIAGNOSIS — K9423 Gastrostomy malfunction: Secondary | ICD-10-CM | POA: Diagnosis not present

## 2017-09-11 DIAGNOSIS — Z79899 Other long term (current) drug therapy: Secondary | ICD-10-CM | POA: Insufficient documentation

## 2017-09-11 DIAGNOSIS — E119 Type 2 diabetes mellitus without complications: Secondary | ICD-10-CM | POA: Diagnosis not present

## 2017-09-11 DIAGNOSIS — T85528A Displacement of other gastrointestinal prosthetic devices, implants and grafts, initial encounter: Secondary | ICD-10-CM

## 2017-09-11 DIAGNOSIS — I10 Essential (primary) hypertension: Secondary | ICD-10-CM | POA: Insufficient documentation

## 2017-09-11 DIAGNOSIS — Z431 Encounter for attention to gastrostomy: Secondary | ICD-10-CM | POA: Insufficient documentation

## 2017-09-11 LAB — CULTURE, BLOOD (ROUTINE X 2)
Culture: NO GROWTH
Culture: NO GROWTH
Special Requests: ADEQUATE

## 2017-09-11 MED ORDER — IOHEXOL 300 MG/ML  SOLN
30.0000 mL | Freq: Once | INTRAMUSCULAR | Status: AC | PRN
  Administered 2017-09-11: 30 mL via ORAL

## 2017-09-11 NOTE — ED Notes (Signed)
18 [place] placed

## 2017-09-11 NOTE — ED Provider Notes (Addendum)
[facility]-EMERGENCY DEPT Provider Note   CSN: [id number] Arrival date & time: 09/11/17  0744     History   Chief Complaint Chief Complaint  Patient presents with  . G-Tube Out    HPI Rachel Vang is a 52 y.o. female.  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  Patient was seen yesterday for clogged PEG and a 16 [place] was placed.  Sometime throughout the night patient pulled the tube out.  She has no complaints of abdominal pain but there is some bleeding around the site of where the tube was.  The history is provided by the EMS personnel.    Past Medical History:  Diagnosis Date  . Acute pulmonary embolism (HCC) 02/19/2015  . Acute respiratory failure (HCC)   . Diabetes mellitus without complication (HCC)    Type 2, W/o complications  . DVT (deep venous thrombosis) (HCC) 04/05/2015  . Dysphagia   . Epilepsy (HCC)   . GERD (gastroesophageal reflux disease)   . Hyperlipidemia   . Hypertension   . IBS (irritable bowel syndrome)   . Nontraumatic subarachnoid hemorrhage (HCC)   . SAH (subarachnoid hemorrhage) (HCC)   . Urinary retention     Patient Active Problem List   Diagnosis Date Noted  . Fever 09/06/2017  . Slow transit constipation 06/08/2017  . Insomnia 06/08/2017  . Generalized weakness 06/07/2017  . Aspiration pneumonia of both lower lobes due to gastric secretions (HCC)   . Demand ischemia (HCC)   . Chronic pain of right knee   . Chronic heel pain, right   . Small bowel obstruction (HCC)   . SVT (supraventricular tachycardia) (HCC)   . AKI (acute kidney injury) (HCC) 05/06/2017  . Hypernatremia 05/06/2017  . Dyslipidemia associated with type 2 diabetes mellitus (HCC) 04/27/2017  . Gingivitis, acute, plaque induced 03/26/2017  . Hypersecretion of saliva 03/14/2017  .  Bruxism (teeth grinding) 03/14/2017  . Lobar pneumonia (HCC)   . Acute on chronic respiratory failure with hypoxia (HCC)   . Pressure injury of skin 02/16/2017  . SOB (shortness of breath)   . Severe sepsis (HCC) 02/15/2017  . Foot drop, right foot 10/18/2016  . CVA (cerebrovascular accident) (HCC) 08/30/2016  . GERD without esophagitis 08/30/2016  . Chronic pulmonary embolism (HCC) 07/03/2016  . Weight loss, non-intentional 06/13/2016  . Hemiparesis affecting right side as late effect of stroke (HCC) 09/25/2015  . Type II diabetes mellitus with neurological manifestations (HCC) 05/20/2015  . Essential hypertension, benign 05/20/2015  . Status post insertion of percutaneous endoscopic gastrostomy (PEG) tube (HCC) 05/20/2015  . DVT (deep venous thrombosis) (HCC) 04/05/2015  . Protein-calorie malnutrition, severe (HCC) 03/25/2015  . Dysphagia 02/18/2015  . Seizures (HCC) 02/18/2015  . Elevated troponin 02/18/2015  . Urine retention 02/18/2015  . Sepsis (HCC) 02/17/2015  . History of ETT   . Subarachnoid hemorrhage [facility])     Past Surgical History:  Procedure Laterality Date  . ABDOMINAL SURGERY    . ANEURYSM COILING    . COLONOSCOPY N/A 02/20/2015   Procedure: COLONOSCOPY;  Surgeon: Iva Boop, MD;  Location: [facility] ENDOSCOPY;  Service: Endoscopy;  Laterality: N/A;  . ESOPHAGOGASTRODUODENOSCOPY (EGD) WITH PROPOFOL N/A 02/02/2015   Procedure: ESOPHAGOGASTRODUODENOSCOPY (EGD) WITH PROPOFOL;  Surgeon: Jimmye Norman, MD;  Location: [facility] ENDOSCOPY;  Service: General;  Laterality: N/A;  . IR GENERIC HISTORICAL  08/26/2015   IR GASTRIC TUBE PERC CHG W/O IMG GUIDE 08/26/2015 Irish LackGlenn Yamagata, MD [facility]-INTERV RAD  . IR GENERIC HISTORICAL  09/14/2015   IR REPLC GASTRO/COLONIC TUBE PERCUT W/FLUORO 09/14/2015 Darrell K Allred, PA-C [facility]-INTERV RAD  . IR REPLACE G-TUBE SIMPLE WO FLUORO  06/08/2016  . IR REPLACE G-TUBE SIMPLE WO FLUORO  10/24/2016  . IR REPLACE G-TUBE SIMPLE WO FLUORO  01/07/2017  . PEG  PLACEMENT N/A 02/02/2015   Procedure: PERCUTANEOUS ENDOSCOPIC GASTROSTOMY (PEG) PLACEMENT;  Surgeon: Jimmye NormanJames Wyatt, MD;  [facility];  Service: General;  Laterality: N/A;  . RADIOLOGY WITH ANESTHESIA N/A 01/13/2015   Procedure: RADIOLOGY WITH ANESTHESIA;  Surgeon: Lisbeth RenshawNeelesh Nundkumar, MD;  Location: [facility] OR;  Service: Radiology;  Laterality: N/A;  . TRACHEOSTOMY       OB History   None      Home Medications    Prior to Admission medications   Medication Sig Start Date End Date Taking? Authorizing Provider  acetaminophen (TYLENOL) 325 MG tablet Place 650 mg into feeding tube every 4 (four) hours as needed for mild pain or fever. 09/08/17   [provider]  Amino Acids-Protein Hydrolys (FEEDING SUPPLEMENT, PRO-STAT SUGAR FREE 64,) LIQD Place 30 mLs into feeding tube daily.    [provider]  bethanechol (URECHOLINE) 10 MG tablet Take 10 mg by mouth 3 (three) times daily. 09/08/17   [provider]  enoxaparin (LOVENOX) 60 MG/0.6ML injection Inject 60 mg into the skin every 12 (twelve) hours.    [provider]  gabapentin (NEURONTIN) 100 MG capsule Give 200 mg Per G-Tube two times daily HOLD IF LETHARGIC 09/08/17   Ellwood Sayersananis, Leonard, MD  levETIRAcetam (KEPPRA) 100 MG/ML solution Place 5 mLs (500 mg total) into feeding tube 2 (two) times daily. 02/11/15   Leroy SeaSingh, Prashant K, MD  metoprolol tartrate (LOPRESSOR) 25 MG tablet Take 25 mg by mouth 2 (two) times daily. Hold if SBP < 100 apical pulse    [provider]  Multiple Vitamin (MULTIVITAMIN) tablet Place 1 tablet into feeding tube daily.     [provider]  Nutritional Supplements (FEEDING SUPPLEMENT, JEVITY 1.5 CAL,) LIQD Place 42 mLs into feeding tube every hour. Continue with 42 cc per hour and flush with 200 cc every 4 hours     [provider]  polyethylene glycol (MIRALAX / GLYCOLAX) packet Place 17 g into feeding tube daily.     [provider]  Probiotic  Product (PROBIOTIC DAILY PO) Place 1 capsule into feeding tube 2 (two) times daily. 09/08/17   [provider]  ranitidine (ZANTAC) 150 MG tablet Place 150 mg into feeding tube daily.  04/25/17   [provider]  rosuvastatin (CRESTOR) 10 MG tablet Take 10 mg by mouth daily.    [provider]  sertraline (ZOLOFT) 100 MG tablet Place 100 mg into feeding tube daily.  07/12/17   [provider]  traZODone (DESYREL) 50 MG tablet Place 50 mg into feeding tube at bedtime.    [provider]  Water For Irrigation, Sterile (FREE WATER) SOLN Place 200 mLs into feeding tube 4 (four) times daily. 02/22/17   Calvert Cantorizwan, Saima, MD    Family History Family History  Problem Relation Age of Onset  . Hypertension Other     Social History Social History   Tobacco Use  . Smoking status: Former Games developermoker  . Smokeless tobacco: Never Used  Substance Use Topics  . Alcohol use: No  . Drug use: No  Allergies   Patient has no known allergies.   Review of Systems Review of Systems  All other systems reviewed and are negative.    Physical Exam Updated Vital Signs BP (!) 122/92 (BP Location: Left Arm)   Pulse 95   Temp 98.5 F (36.9 C) (Oral)   Resp 19   LMP  (LMP Unknown)   SpO2 99%   Physical Exam  Constitutional: She appears well-developed and well-nourished. No distress.  HENT:  Head: Normocephalic and atraumatic.  Eyes: Pupils are equal, round, and reactive to light.  Cardiovascular: Normal rate.  Pulmonary/Chest: Effort normal.  Abdominal:    Neurological:  Pt awake and alert but not speaking.  Nodding when spoken to  Skin: Skin is warm and dry.  Psychiatric:  Calm and cooperative  Nursing note and vitals reviewed.    ED Treatments / Results  Labs (all labs ordered are listed, but only abnormal results are displayed) Labs Reviewed - No data to display  EKG None  Radiology Dg Abdomen 1 View  Result Date: 09/10/2017 CLINICAL  DATA:  Confirmation of PEG tube placement. 30 mL Omnipaque given. EXAM: ABDOMEN - 1 VIEW COMPARISON:  09/06/2017 FINDINGS: Gastrostomy tube is present. Contrast material is demonstrated in the stomach suggesting appropriate location of the tube within the stomach. No contrast extravasation is demonstrated. Scattered gas and stool throughout the colon with gas-filled nondistended small bowel. No radiopaque stones. Degenerative changes in the spine and hips. Vascular calcifications. IMPRESSION: Gastrostomy tube appears to be in the stomach. No contrast extravasation. Electronically Signed   By: Burman Nieves M.D.   On: 09/10/2017 05:00   Dg Abd Portable 1 View  Result Date: 09/11/2017 CLINICAL DATA:  Gastrostomy catheter placement. EXAM: PORTABLE ABDOMEN - 1 VIEW COMPARISON:  September 10, 2017 FINDINGS: 30 mL Omnipaque 300 contrast injected through gastrostomy catheter. Gastrostomy catheter is position within the stomach. No contrast extravasation. Contrast flows from the stomach into the duodenum. No bowel dilatation or air-fluid level to suggest bowel obstruction. No evident free air. IMPRESSION: Gastrostomy catheter position within the stomach. No contrast extravasation. No bowel obstruction or free air evident. Electronically Signed   By: Bretta Bang III M.D.   On: 09/11/2017 09:21    Procedures Gastrostomy tube replacement Date/Time: 09/11/2017 8:23 AM Performed by: Gwyneth Sprout, MD Authorized by: Gwyneth Sprout, MD  Consent: Verbal consent obtained. Consent given by: patient Patient understanding: patient states understanding of the procedure being performed Patient identity confirmed: verbally with patient Local anesthesia used: no  Anesthesia: Local anesthesia used: no  Sedation: Patient sedated: no  Patient tolerance: Patient tolerated the procedure well with no immediate complications Comments: 43french PEG placed without complication.  Stomach contents present in the  tube    (including critical care time)  Medications Ordered in ED Medications - No data to display   Initial Impression / Assessment and Plan / ED Course  I have reviewed the triage vital signs and the nursing notes.  Pertinent labs & imaging results that were available during my care of the patient were reviewed by me and considered in my medical decision making (see chart for details).     Pt presenting today with PEG tube being pulled out.  Pt has no other issues at this time and not in distress.  Mild bleeding around stoma most likely due to pulling prior PEG out with balloon intact.  18 french placed as there were no 16 french available.  Tube confirmation with KUB and pt d/ced home.  Final Clinical Impressions(s) / ED Diagnoses   Final diagnoses:  Dislodged gastrostomy tube [facility](HCC)    ED Discharge Orders    None       Gwyneth SproutPlunkett, Elsa Ploch, MD 09/11/17 [id number]    Gwyneth SproutPlunkett, Amran Malter, MD 09/11/17 [phone]

## 2017-09-11 NOTE — ED Notes (Signed)
Bed: WA07 Expected date:  Expected time:  Means of arrival:  Comments: EMS 52yo female, removed G tube

## 2017-09-11 NOTE — ED Triage Notes (Signed)
[facility]. Reports patient pulled out G-Tube this morning.

## 2017-09-12 ENCOUNTER — Non-Acute Institutional Stay (SKILLED_NURSING_FACILITY): Payer: Medicaid Other | Admitting: Internal Medicine

## 2017-09-12 ENCOUNTER — Encounter: Payer: Self-pay | Admitting: Internal Medicine

## 2017-09-12 DIAGNOSIS — I6930 Unspecified sequelae of cerebral infarction: Secondary | ICD-10-CM | POA: Diagnosis not present

## 2017-09-12 DIAGNOSIS — Z931 Gastrostomy status: Secondary | ICD-10-CM | POA: Diagnosis not present

## 2017-09-12 DIAGNOSIS — E1149 Type 2 diabetes mellitus with other diabetic neurological complication: Secondary | ICD-10-CM | POA: Diagnosis not present

## 2017-09-12 DIAGNOSIS — I825Y2 Chronic embolism and thrombosis of unspecified deep veins of left proximal lower extremity: Secondary | ICD-10-CM

## 2017-09-12 DIAGNOSIS — R569 Unspecified convulsions: Secondary | ICD-10-CM

## 2017-09-12 DIAGNOSIS — R1314 Dysphagia, pharyngoesophageal phase: Secondary | ICD-10-CM | POA: Diagnosis not present

## 2017-09-12 DIAGNOSIS — I69351 Hemiplegia and hemiparesis following cerebral infarction affecting right dominant side: Secondary | ICD-10-CM

## 2017-09-12 NOTE — Progress Notes (Signed)
Patient ID: Rachel Vang, female   DOB: 08/21/1965, 52 y.o.   MRN: [id number]   Provider:  DR Elmon KirschnerMONICA S Khaleah Duer Location[facility]  [facility] Room Number: 208 [phone] Place of Service:  SNF (31)  PCP: Kirt Boysarter, Cianni Manny, DO Patient Care Team: Kirt Boysarter, Neima Lacross, DO as PCP - General (Internal Medicine) Chilton SiGreen, Chong Sicilianeborah S, NP as Nurse Practitioner (Geriatric Medicine) [facility] (Sk[facility] Facility)  Extended Emergency Contact Information Primary Emergency Contact: Silas SacramentoHagler,Carlton  [place] Mobile Phone: [phone] Relation: Son Secondary Emergency Contact: Debroah BallerHagler,[name]  [place] Mobile Phone: [phone] Relation: Son  Code Status:  Full Code Goals of Care: Advanced Directive information Advanced Directives 09/12/2017  Does Patient Have a Medical Advance Directive? Yes  Type of Advance Directive Out of facility DNR (pink MOST or yellow form)  Does patient want to make changes to medical advance directive? No - Patient declined  Copy of Healthcare Power of Attorney in Chart? -  Would patient like information on creating a medical advance directive? No - Patient declined  Pre-existing out of facility DNR order (yellow form or pink MOST form) Pink MOST form placed in chart (order not valid for inpatient use)      Chief Complaint  Patient presents with  . Readmit To SNF    Readmission    HPI: Patient is a 52 y.o. female seen today for re-admission to SNF following hospital stay for fever with hx SAH, hx CVA, dysphagua, sz d/o, DVT, DM, HTN. Etiology of fever (Tm 101F) not found as BC showed NGTD, UA unremarkable, CXR NCPD. She was tx with 1 dose of IV vanco and cefepime. ALT 95-->51; albumin 4.0; WBC 11.1K-->9K; abs neutrophils 7.9K. She was sent back to SNF to continue long term care.   Today she has no concerns. She was sent to the ED on 8/12 and 8/14 due to gastrostomy tube concerns. Her tube was replaced. She is tolerating  TF wo N/V or abdominal pain. No further f/c. She is a poor historian due to expressive aphasia. Hx obtained from chart. She is receiving lovenox for anticoagulation. No sz activity. No falls. She is strict NPO.  Past Medical History:  Diagnosis Date  . Acute pulmonary embolism (HCC) 02/19/2015  . Acute respiratory failure (HCC)   . Diabetes mellitus without complication (HCC)    Type 2, W/o complications  . DVT (deep venous thrombosis) (HCC) 04/05/2015  . Dysphagia   . Epilepsy (HCC)   . GERD (gastroesophageal reflux disease)   . Hyperlipidemia   . Hypertension   . IBS (irritable bowel syndrome)   . Nontraumatic subarachnoid hemorrhage (HCC)   . SAH (subarachnoid hemorrhage) (HCC)   . Urinary retention    Past Surgical History:  Procedure Laterality Date  . ABDOMINAL SURGERY    . ANEURYSM COILING    . COLONOSCOPY N/A 02/20/2015   Procedure: COLONOSCOPY;  Surgeon: Iva Booparl E Gessner, MD;  [facility];  Service: Endoscopy;  Laterality: N/A;  . ESOPHAGOGASTRODUODENOSCOPY (EGD) WITH PROPOFOL N/A 02/02/2015   Procedure: ESOPHAGOGASTRODUODENOSCOPY (EGD) WITH PROPOFOL;  Surgeon: Jimmye NormanJames Wyatt, MD;  [facility];  Service: General;  Laterality: N/A;  . IR GENERIC HISTORICAL  08/26/2015   IR GASTRIC TUBE PERC CHG W/O IMG GUIDE 08/26/2015 Irish LackGlenn Yamagata, MD [facility]-INTERV RAD  . IR GENERIC HISTORICAL  09/14/2015   IR REPLC GASTRO/COLONIC TUBE PERCUT W/FLUORO 09/14/2015 Darrell K Allred, PA-C [facility]-INTERV RAD  . IR REPLACE G-TUBE SIMPLE WO FLUORO  06/08/2016  . IR  REPLACE G-TUBE SIMPLE WO FLUORO  10/24/2016  . IR REPLACE G-TUBE SIMPLE WO FLUORO  01/07/2017  . PEG PLACEMENT N/A 02/02/2015   Procedure: PERCUTANEOUS ENDOSCOPIC GASTROSTOMY (PEG) PLACEMENT;  Surgeon: Jimmye Norman, MD;  Location: [facility] ENDOSCOPY;  Service: General;  Laterality: N/A;  . RADIOLOGY WITH ANESTHESIA N/A 01/13/2015   Procedure: RADIOLOGY WITH ANESTHESIA;  Surgeon: Lisbeth Renshaw, MD;  Location: [facility] OR;  Service: Radiology;   Laterality: N/A;  . TRACHEOSTOMY      reports that she has quit smoking. She has never used smokeless tobacco. She reports that she does not drink alcohol or use drugs. Social History   Socioeconomic History  . Marital status: Single    Spouse name: Not on file  . Number of children: Not on file  . Years of education: Not on file  . Highest education level: Not on file  Occupational History  . Not on file  Social Needs  . Financial resource strain: Not on file  . Food insecurity:    Worry: Not on file    Inability: Not on file  . Transportation needs:    Medical: Not on file    Non-medical: Not on file  Tobacco Use  . Smoking status: Former Games developer  . Smokeless tobacco: Never Used  Substance and Sexual Activity  . Alcohol use: No  . Drug use: No  . Sexual activity: Not on file  Lifestyle  . Physical activity:    Days per week: Not on file    Minutes per session: Not on file  . Stress: Not on file  Relationships  . Social connections:    Talks on phone: Not on file    Gets together: Not on file    Attends religious service: Not on file    Active member of club or organization: Not on file    Attends meetings of clubs or organizations: Not on file    Relationship status: Not on file  . Intimate partner violence:    Fear of current or ex partner: Not on file    Emotionally abused: Not on file    Physically abused: Not on file    Forced sexual activity: Not on file  Other Topics Concern  . Not on file  Social History Narrative  . Not on file    Functional Status Survey:    Family History  Problem Relation Age of Onset  . Hypertension Other     Health Maintenance  Topic Date Due  . PNEUMOCOCCAL POLYSACCHARIDE VACCINE AGE 50-64 HIGH RISK  10/11/2017 (Originally 05/04/1967)  . INFLUENZA VACCINE  12/11/2017 (Originally 08/29/2017)  . URINE MICROALBUMIN  09/24/2017  . HEMOGLOBIN A1C  11/29/2017  . FOOT EXAM  06/27/2018  . OPHTHALMOLOGY EXAM  09/04/2018  .  MAMMOGRAM  06/18/2019  . COLONOSCOPY  02/19/2025  . Hepatitis C Screening  Completed  . HIV Screening  Completed  . PAP SMEAR  Discontinued  . TETANUS/TDAP  Discontinued    No Known Allergies  Outpatient Encounter Medications as of 09/12/2017  Medication Sig  . acetaminophen (TYLENOL) 325 MG tablet Place 650 mg into feeding tube every 4 (four) hours as needed for mild pain or fever.  . Amino Acids-Protein Hydrolys (FEEDING SUPPLEMENT, PRO-STAT SUGAR FREE 64,) LIQD Place 30 mLs into feeding tube daily.  . bethanechol (URECHOLINE) 10 MG tablet Take 10 mg by mouth 3 (three) times daily.  Marland Kitchen enoxaparin (LOVENOX) 60 MG/0.6ML injection Inject 60 mg into the skin every 12 (twelve) hours.  Marland Kitchen  gabapentin (NEURONTIN) 100 MG capsule Give 200 mg Per G-Tube two times daily HOLD IF LETHARGIC  . levETIRAcetam (KEPPRA) 100 MG/ML solution Place 5 mLs (500 mg total) into feeding tube 2 (two) times daily.  . metoprolol tartrate (LOPRESSOR) 25 MG tablet Take 25 mg by mouth 2 (two) times daily. Hold if SBP < 100 apical pulse  . Multiple Vitamin (MULTIVITAMIN) tablet Place 1 tablet into feeding tube daily.   . Nutritional Supplements (FEEDING SUPPLEMENT, JEVITY 1.5 CAL,) LIQD Place 42 mLs into feeding tube every hour. Continue with 42 cc per hour and flush with 200 cc every 4 hours   . polyethylene glycol (MIRALAX / GLYCOLAX) packet Place 17 g into feeding tube daily.   . Probiotic Product (PROBIOTIC DAILY PO) Place 1 capsule into feeding tube 2 (two) times daily.  . ranitidine (ZANTAC) 150 MG tablet Place 150 mg into feeding tube daily.   . rosuvastatin (CRESTOR) 10 MG tablet Take 10 mg by mouth daily.  . sertraline (ZOLOFT) 100 MG tablet Place 100 mg into feeding tube daily.   . traZODone (DESYREL) 50 MG tablet Place 50 mg into feeding tube at bedtime.  . Water For Irrigation, Sterile (FREE WATER) SOLN Place 200 mLs into feeding tube 4 (four) times daily.   No facility-administered encounter medications on  file as of 09/12/2017.     Review of Systems  Unable to perform ROS: Other (expressive aphasia)    Vitals:   09/12/17 0906  BP: 127/64  Pulse: 74  Resp: 18  Temp: 98.1 F (36.7 C)  SpO2: 96%  Weight: 117 lb (53.1 kg)  Height: 5\' 5"  (1.651 m)   Body mass index is 19.47 kg/m. Physical Exam  Constitutional: She appears well-developed.  Frail appearing lying in bed in NAD; TF running  HENT:  Mouth/Throat: Oropharynx is clear and moist. No oropharyngeal exudate.  Increased salivation; MMM; no oral thrush  Eyes: Pupils are equal, round, and reactive to light. No scleral icterus.  Neck: Neck supple. Carotid bruit is not present. No tracheal deviation present. No thyromegaly present.  Cardiovascular: Normal rate, regular rhythm and intact distal pulses. Exam reveals no gallop and no friction rub.  Murmur (1/6 SEM) heard. No LE edema b/l. no calf TTP.   Pulmonary/Chest: Effort normal and breath sounds normal. No stridor. No respiratory distress. She has no wheezes. She has no rales.  Abdominal: Soft. Normal appearance and bowel sounds are normal. She exhibits no distension and no mass. There is no hepatomegaly. There is tenderness (generally). There is no rigidity, no rebound and no guarding. No hernia.  G tube intact with TF running @ 42 cc/hr; dressing c/d/i.  Musculoskeletal: She exhibits edema.  Lymphadenopathy:    She has no cervical adenopathy.  Neurological: She is alert. She has normal reflexes.  Left grip strength 4/5  Skin: Skin is warm and dry. No rash noted.  Psychiatric: She has a normal mood and affect. Her behavior is normal.  (+) expressive aphasia    Labs reviewed: Basic Metabolic Panel: Recent Labs    02/16/17 0614  02/17/17 0319  05/08/17 0242  09/06/17 0529 09/07/17 0636 09/08/17 0520  NA 151*   < >  --    < > 152*   < > 141 141 141  K 4.1   < >  --    < > 2.4*   < > 4.2 3.8 4.1  CL 111   < >  --    < > 117*   < >  106 111 107  CO2 21*   < >  --    <  > 24   < > 25 24 24   GLUCOSE 124*   < >  --    < > 132*   < > 101* 119* 104*  BUN 15   < >  --    < > 20   < > 17 11 14   CREATININE 0.88   < >  --    < > 1.07*   < > 0.94 0.76 0.59  CALCIUM 9.5   < >  --    < > 8.7*   < > 9.8 9.5 9.4  MG  --   --  2.3  --  2.0  --   --   --   --   PHOS 2.3*  --  3.0  --   --   --   --   --   --    < > = values in this interval not displayed.   Liver Function Tests: Recent Labs    05/07/17 0041  07/23/17 08/08/17 2337 09/06/17 0529  AST 33   < > 37* 49* 39  ALT 15   < > 85* 95* 51*  ALKPHOS 52   < > 121 104 95  BILITOT 0.6  --   --  0.3 0.8  PROT 5.3*  --   --  8.3* 7.9  ALBUMIN 2.3*  --   --  3.8 4.0   < > = values in this interval not displayed.   No results for input(s): LIPASE, AMYLASE in the last 8760 hours. No results for input(s): AMMONIA in the last 8760 hours. CBC: Recent Labs    08/08/17 2337 09/06/17 0529 09/07/17 0636 09/08/17 0520  WBC 9.0 11.1* 7.1 6.6  NEUTROABS 5.9 7.9*  --  3.9  HGB 12.4 12.2 11.1* 11.4*  HCT 39.7 37.9 35.6* 36.5  MCV 91.9 90.5 93.0 93.4  PLT 290 273 225 219   Cardiac Enzymes: Recent Labs    05/07/17 0041 05/07/17 0605 05/07/17 1206  TROPONINI 0.20* 0.14* 0.06*   BNP: Invalid input(s): POCBNP Lab Results  Component Value Date   HGBA1C 6.1 05/29/2017   No results found for: TSH Lab Results  Component Value Date   VITAMINB12 521 06/14/2016   No results found for: FOLATE No results found for: IRON, TIBC, FERRITIN  Imaging and Procedures obtained prior to SNF admission: Dg Abd Portable 1 View  Result Date: 09/11/2017 CLINICAL DATA:  Gastrostomy catheter placement. EXAM: PORTABLE ABDOMEN - 1 VIEW COMPARISON:  September 10, 2017 FINDINGS: 30 mL Omnipaque 300 contrast injected through gastrostomy catheter. Gastrostomy catheter is position within the stomach. No contrast extravasation. Contrast flows from the stomach into the duodenum. No bowel dilatation or air-fluid level to suggest bowel  obstruction. No evident free air. IMPRESSION: Gastrostomy catheter position within the stomach. No contrast extravasation. No bowel obstruction or free air evident. Electronically Signed   By: Bretta Bang III M.D.   On: 09/11/2017 09:21    Assessment/Plan   ICD-10-CM   1. S/P percutaneous endoscopic gastrostomy (PEG) tube placement (HCC) Z93.1   2. Pharyngoesophageal dysphagia R13.14   3. 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 History of stroke with residual deficit I69.30   5. Hemiparesis affecting right side as late effect of stroke (HCC) I69.351   6. Chronic deep vein thrombosis (DVT) of proximal vein of left lower extremity (HCC) I82.5Y2  7. Seizures (HCC) R56.9     Cont current meds as ordered  PT/OT/ST as indicated  TF as ordered with fre water flushes  G tube care as indicated  F/u with specialists as scheduled  GOAL: cont long term care. Communicated with pt and nursing.  Will follow  Labs/tests ordered: none    Everton Bertha S. Ancil Linseyarter, D. O., F. A. C. O. I.  [facility] [address] [place], [place] [address] [phone] Cell (Monday-Friday 8 AM - 5 PM) [phone] After 5 PM and follow prompts

## 2017-09-18 ENCOUNTER — Encounter: Payer: Self-pay | Admitting: Internal Medicine

## 2017-09-19 DIAGNOSIS — I69359 Hemiplegia and hemiparesis following cerebral infarction affecting unspecified side: Secondary | ICD-10-CM | POA: Insufficient documentation

## 2017-09-24 ENCOUNTER — Encounter: Payer: Self-pay | Admitting: Adult Health

## 2017-09-24 ENCOUNTER — Non-Acute Institutional Stay (SKILLED_NURSING_FACILITY): Payer: Medicaid Other | Admitting: Adult Health

## 2017-09-24 DIAGNOSIS — E1169 Type 2 diabetes mellitus with other specified complication: Secondary | ICD-10-CM

## 2017-09-24 DIAGNOSIS — R634 Abnormal weight loss: Secondary | ICD-10-CM

## 2017-09-24 DIAGNOSIS — E785 Hyperlipidemia, unspecified: Secondary | ICD-10-CM

## 2017-09-24 DIAGNOSIS — I825Y2 Chronic embolism and thrombosis of unspecified deep veins of left proximal lower extremity: Secondary | ICD-10-CM | POA: Diagnosis not present

## 2017-09-24 DIAGNOSIS — R569 Unspecified convulsions: Secondary | ICD-10-CM

## 2017-09-24 NOTE — Progress Notes (Signed)
Location:   [facility] Room Number: 208 A Place of Service:  SNF (31)   CODE STATUS:Full code (Most form updated 08/21/16)   No Known Allergies  Chief Complaint  Patient presents with  . Medical Management of Chronic Issues    Chronic dvt; dyslipidemia; seizures; weight loss. Weekly follow up for the first 30 days post hospitalization.     HPI:  She is a 52 year old long term resident of this facility being seen for the management of her chronic illnesses: chronic dvt; dyslipidemia; seizures; weight loss. She had been hospitalized after pulling out her peg tube. She is unable to participate in the hpi or ros. There are no reports of uncontrolled pain. She continues to decline her tube feeding. There are no reports of any seizure activity   Past Medical History:  Diagnosis Date  . Acute pulmonary embolism ([place]) 02/19/2015  . Acute respiratory failure ([place])   . Diabetes mellitus without complication (HCC)    Type 2, W/o complications  . DVT (deep venous thrombosis) ([place]) 04/05/2015  . Dysphagia   . Epilepsy ([place])   . GERD (gastroesophageal reflux disease)   . Hyperlipidemia   . Hypertension   . IBS (irritable bowel syndrome)   . Nontraumatic subarachnoid hemorrhage ([place])   . SAH (subarachnoid hemorrhage) ([place])   . Urinary retention     Past Surgical History:  Procedure Laterality Date  . ABDOMINAL SURGERY    . ANEURYSM COILING    . COLONOSCOPY N/A 02/20/2015   Procedure: COLONOSCOPY;  Surgeon: Gatha Mayer, MD;  Location: [facility];  Service: Endoscopy;  Laterality: N/A;  . ESOPHAGOGASTRODUODENOSCOPY (EGD) WITH PROPOFOL N/A 02/02/2015   Procedure: ESOPHAGOGASTRODUODENOSCOPY (EGD) WITH PROPOFOL;  Surgeon: Judeth Horn, MD;  Location: [facility];  Service: General;  Laterality: N/A;  . IR GENERIC HISTORICAL  08/26/2015   IR GASTRIC TUBE PERC CHG W/O IMG GUIDE 08/26/2015 Aletta Edouard, MD [facility]  . IR GENERIC HISTORICAL  09/14/2015   IR REPLC  GASTRO/COLONIC TUBE PERCUT W/FLUORO 09/14/2015 Darrell K Allred, PA-C [facility]  . IR REPLACE G-TUBE SIMPLE WO FLUORO  06/08/2016  . IR REPLACE G-TUBE SIMPLE WO FLUORO  10/24/2016  . IR REPLACE G-TUBE SIMPLE WO FLUORO  01/07/2017  . PEG PLACEMENT N/A 02/02/2015   Procedure: PERCUTANEOUS ENDOSCOPIC GASTROSTOMY (PEG) PLACEMENT;  Surgeon: Judeth Horn, MD;  Location: [facility];  Service: General;  Laterality: N/A;  . RADIOLOGY WITH ANESTHESIA N/A 01/13/2015   Procedure: RADIOLOGY WITH ANESTHESIA;  Surgeon: Consuella Lose, MD;  Location: [facility];  Service: Radiology;  Laterality: N/A;  . TRACHEOSTOMY      Social History   Socioeconomic History  . Marital status: Single    Spouse name: Not on file  . Number of children: Not on file  . Years of education: Not on file  . Highest education level: Not on file  Occupational History  . Not on file  Social Needs  . Financial resource strain: Not on file  . Food insecurity:    Worry: Not on file    Inability: Not on file  . Transportation needs:    Medical: Not on file    Non-medical: Not on file  Tobacco Use  . Smoking status: Former Research scientist (life sciences)  . Smokeless tobacco: Never Used  Substance and Sexual Activity  . Alcohol use: No  . Drug use: No  . Sexual activity: Not on file  Lifestyle  . Physical activity:    Days per week: Not on  file    Minutes per session: Not on file  . Stress: Not on file  Relationships  . Social connections:    Talks on phone: Not on file    Gets together: Not on file    Attends religious service: Not on file    Active member of club or organization: Not on file    Attends meetings of clubs or organizations: Not on file    Relationship status: Not on file  . Intimate partner violence:    Fear of current or ex partner: Not on file    Emotionally abused: Not on file    Physically abused: Not on file    Forced sexual activity: Not on file  Other Topics Concern  . Not on file  Social History Narrative  .  Not on file   Family History  Problem Relation Age of Onset  . Hypertension Other       VITAL SIGNS BP 120/60   Pulse 68   Temp (!) 97.1 F (36.2 C)   Resp 18   Ht 5' 5"  (1.651 m)   Wt 114 lb 12.8 oz (52.1 kg)   LMP  (LMP Unknown)   SpO2 96%   BMI 19.10 kg/m   Outpatient Encounter Medications as of 09/24/2017  Medication Sig  . acetaminophen (TYLENOL) 325 MG tablet Give 2 tablets via G-tube every 6 hours for pain and every 4 hours as needed  . Amino Acids-Protein Hydrolys (FEEDING SUPPLEMENT, PRO-STAT SUGAR FREE 64,) LIQD Place 30 mLs into feeding tube daily.  . bethanechol (URECHOLINE) 10 MG tablet Place 10 mg into feeding tube 3 (three) times daily.   Marland Kitchen ENOXAPARIN SODIUM [place] Inject 50 mg into the skin every 12 (twelve) hours.   . gabapentin (NEURONTIN) 100 MG capsule Give 200 mg Per G-Tube two times daily HOLD IF LETHARGIC  . levETIRAcetam (KEPPRA) 100 MG/ML solution Place 5 mLs (500 mg total) into feeding tube 2 (two) times daily.  . metoprolol tartrate (LOPRESSOR) 25 MG tablet Place 25 mg into feeding tube 2 (two) times daily. Hold if SBP < 100 apical pulse  . Multiple Vitamin (MULTIVITAMIN) tablet Place 1 tablet into feeding tube daily.   . Nutritional Supplements (FEEDING SUPPLEMENT, JEVITY 1.5 CAL,) LIQD Place 42 mLs into feeding tube every hour. Continue with 42 cc per hour and flush with 200 cc every 4 hours   . polyethylene glycol (MIRALAX / GLYCOLAX) packet Place 17 g into feeding tube daily.   . Probiotic Product (PROBIOTIC DAILY PO) Place 1 capsule into feeding tube 2 (two) times daily.  . ranitidine (ZANTAC) 150 MG tablet Place 150 mg into feeding tube daily.   . rosuvastatin (CRESTOR) 10 MG tablet Place 10 mg into feeding tube daily.   . sertraline (ZOLOFT) 100 MG tablet Place 100 mg into feeding tube daily.   . traZODone (DESYREL) 50 MG tablet Place 50 mg into feeding tube at bedtime.  . Water For Irrigation, Sterile (FREE WATER) SOLN Place 200 mLs into feeding  tube 4 (four) times daily.   No facility-administered encounter medications on file as of 09/24/2017.      SIGNIFICANT DIAGNOSTIC EXAMS  PREVIOUS  05-06-17: ct angio of chest: 1. No pulmonary embolus identified. 2. Diffuse peribronchial thickening with debris in the mainstem bronchi and extending into the lower lobes. Findings may represent acute bronchitis or aspiration given the distribution. No consolidation. 3. Mildly patulous esophagus. 4. Aorta and coronary artery calcific atherosclerosis. 05-08-17: 2-d echo: - Left ventricle: The  cavity size was normal. There was mild concentric hypertrophy. Systolic function was vigorous. The estimated ejection fraction was in the range of 65% to 70%. Wall motion was normal; there were no regional wall motion abnormalities. Doppler parameters are consistent with abnormal left ventricular relaxation (grade 1 diastolic dysfunction). There was no evidence of elevated ventricular filling pressure by  Doppler parameters. - Aortic valve: There was no regurgitation. - Aortic root: The aortic root was normal in size. - Mitral valve: There was no regurgitation. - Left atrium: The atrium was normal in size. - Right ventricle: Systolic function was normal. - Right atrium: The atrium was normal in size. - Pulmonic valve: There was no regurgitation. - Inferior vena cava: The vessel was normal in size. - Pericardium, extracardiac: A mild anteriorly located pericardial effusion was identified. Features were not consistent with  tamponade physiology.  05-09-17: right foot x-ray: 1. No radiographic findings of osteomyelitis. 2. Osteoporosis.  05-12-17: MRI right knee: 1. Incomplete study as only three axial sequences were obtained. The patient refused further imaging due to pain. 2. No joint effusion, excluding septic arthritis. 3. 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 No evidence of osteomyelitis.  05-13-17: MRI right ankle: 1. No osteomyelitis of right  ankle. 2. No MRI evidence of septic arthritis of the right ankle. 3. Soft tissue ulcer overlying the posterior calcaneus.  06-17-17: mammogram: no evidence of malignancy  09-06-17: kub:  Contrast injected into the G-tube is noted filling the antrum of the stomach and proximal duodenum as expected.  09-06-17: chest x-ray: No acute cardiopulmonary process seen.  09-06-17: ct of abdomen and pelvis:  1. Gastrostomy tube in place in the stomach. Soft tissue strandingand small volume fluid/hematoma around the tube in the abdominalwall. No evidence of abscess. 2. No other evidence of acute abnormality in the abdomen or pelvis. 3.  Aortic Atherosclerosis   NO NEW EXAMS    LABS REVIEWED: PREVIOUS   10-11-16: glucose 133; bun 25.6; creat 0.63; k+ 4.8; na++ 140; ca 9.9; liver normal albumin 4.3 10-22-16: glucose 131; bun 29.0; creat 0.62; k+ 4.7; na++ 141; ca 9.8; liver normal albumin 4.3 01-06-17: wb 8.1; hgb 15.0; hct 45.0; mcv  93.2; plt 199; glucose 99; bun 20; creat 0.74; k+ 4.0; na++ 142; ca 10.7  01-25-17: hgb a1c 6.1; chol 187; ldl 117; trig 184; hdl 31  02-14-17: wbc 28.0; hgb 13.9; hct 41.0; mcv 90.5 ;plt 297  glucose 165; bun 34.4; creat 1.04; k+ 4.1; NA++ 156; ALT 38 AST 22; alk phos  133; albumin 4.2 blood culture: no growth urine culture no growth 02-14-17 (ED):  Wbc 33.7; hgb 13.4; hct 42.8; mcv 99.5; plt 279 glucose 210; bun 41; creat 1.37; k+ 3.6; na++ 155; ca 9.9; liver normal albumin 3.4  02-15-17: blood culture: no growth; flu: neg HIV: nr; hgb a1c 6.1 02-17-17: wbc 18.4; hgb 9.4; hct 31.6; mcv 97.8; plt 214; glucose 100; bun 23; creat 0.75; k+ 3.2; na++150 liver normal albumin 2.4 mag 2.3 phos 3.0  02-22-17: wbc 13.3; hgb 10.5; hct 33.6; mcv 94.6; plt 424; glucose 102; bun 16; creat 0.58; k+ 4.0; na++ 138; ca 9.9 02-24-17: wbc  11.2; hgb 11.2; hct 35.2; mcv 95.7; plt 387; glucose 115; bun 11; creat 0.57; k+ 4.2; na++ 138; ca 9.4 05-06-17: wbc 16.7; hgb 14.5; hct  47.9; mcv 96.8; plt 306;  glucose 251; bun 68; creat 2.68; k+ 3.5; na++ 154; ca 9.1; blood culture: MRSA: urine culture: no growth 05-07-17: glucose 109; bun 60; creat  2.10; k+ 4.7; na++ 158; ca 7.4 ;liver normal albumin 2.3 05-09-17: wbc 10.0; hgb 11.8; hct 37.7; mcv 92.9; plt 210; glucose 121; bun 7; creat 0.77; k+ 3.0; na++ 142; ca 9.0; blood culture: no growth 05-10-17: wbc 8.5; hgb 11.0; hct 35.1; mcv 92.4; plt 214; glucose 120; bun 6; creat 0.87; k+ 3.2; na++ 143; ca 9.2; hepatitis: neg 05-12-17: wbc 8.9; hgb 11.6; hct 36.0; mcv 91.4; plt 231; glucose 113; bun 8; creat 0.70; k+ 3.6; na++ 141; ca 9.3 05-29-17: chol 80; ldl 31; trig 108; hdl 21; hgb a1c 6.1   06-03-17: wbc 8,8l hgb 12.1; hct 38; plt 209; glucose 119 bun 18; creat 0.6; k+ 5.2; na++147 chol 112; ldl 58; trig 112; hdl 29  08-08-17: wbc 9.0; hgb 12.4; hct 39.7; mcv 91.9; plt 290; glucose 105; bun 11; creat 0.83;  k+ 3.7; na++ 142 ca 10.4; liver normal albumin 3.8  09-06-17: wbc 1.1; hgb 12.2; hct 37.9; mcv 90.5; plt 273 glucose 101; bun 17; creat 0.94; k+ 4.2; na++ 141; liver normal albumin 4.0    urine culture no growth blood culture no growth 09-08-17: wbc  6.6; hgb 11.4; hct 36.5; mcv 93.4; plt 219; glucose 104; bun 14; creat 0.59; k+ 4.1; na++ 141; ca 9.4   NO NEW LABS.    Review of Systems  Unable to perform ROS: Other (CVA: nonverbal )   Physical Exam  Constitutional: She is oriented to person, place, and time. She appears well-developed and well-nourished. No distress.  Thin   Neck: Carotid bruit is present. No thyromegaly present.  Bilateral   Cardiovascular: Normal rate, regular rhythm, normal heart sounds and intact distal pulses.  Pulmonary/Chest: Effort normal and breath sounds normal. No respiratory distress.  Abdominal: Soft. Bowel sounds are normal. She exhibits no distension. There is no tenderness.  Peg tube present without signs of infection present.   Musculoskeletal: She exhibits no edema.  Left hemiplegia   Lymphadenopathy:    She has no  cervical adenopathy.  Neurological: She is alert and oriented to person, place, and time.  Skin: Skin is warm and dry. She is not diaphoretic.  Psychiatric: She has a normal mood and affect.     ASSESSMENT/ PLAN:  TODAY  1. Seizures: is stable  no reports of seizure activity present: will continue keppra 500 mg twice daily and will monitor   2. DVT (chronic left lower extremity) and chronic pulmonary embolism: is stable  she does require long term anticoagulation therapy; will continue lovenox 50  mg twice daily (weigt based); is not a candidate for xarelto; or eliquis due to her history of GI bleed; she is not appropriate for coumadin therapy; unable  to obtain adequate INR.  3. Weight loss: is worse; her current weight is 114 pounds. She has been pulling her tube feeding apart and has been pulling her peg tube out. Will increase her prostat to 30 cc twice daily to help with nutritional support will check pre-albumin and will have his tube feeding reviewed to see is any changes are necessary.   4. Dyslipidemia associated with type 2 diabetes mellitus: stable ldl 58  Will lower her crestor to 5 mg daily due to her weight loss.   PREVIOUS   5. gerd without esophagitis: stable will continue zantac 150 mg daily   6. Slow transit constipation: is status post SBO: will continue miralax 17 gm daily   7. Insomnia: is stable will continue trazodone 50 mg nightly   8. Urine retention: stable  will continue urecholine 10 mg three times daily  ; will not make changes will not make changes.   9. Type 2 diabetes mellitus with neurological manifestations: is stable  hgb a1c is 6.1 (previous 5.4) ; is currently off medications; will continue to monitor her status.  ldl is 85 is on statin.   10. Essential benign hypertension with supraventricular tachycardia:  stable: b/p 120/60  pulse: 74: will continue lopressor 25 mg twice daily   11.  Dysphagia paraesophageal  phase : stable  no signs of  aspiration present she is npo; she is dependent upon peg tube feeding.  12. 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  Will continue lovenox 50 mg twice daily     MD is aware of resident's narcotic use and is in agreement with current plan of care. We will attempt to wean resident as apropriate   Ok Edwards NP [facility]  Contact [phone] Monday through Friday 8am- 5pm  After hours call [phone]

## 2017-09-27 ENCOUNTER — Non-Acute Institutional Stay (SKILLED_NURSING_FACILITY): Payer: Medicaid Other | Admitting: Adult Health

## 2017-09-27 ENCOUNTER — Encounter: Payer: Self-pay | Admitting: Adult Health

## 2017-09-27 DIAGNOSIS — R1314 Dysphagia, pharyngoesophageal phase: Secondary | ICD-10-CM

## 2017-09-27 DIAGNOSIS — I69359 Hemiplegia and hemiparesis following cerebral infarction affecting unspecified side: Secondary | ICD-10-CM

## 2017-09-27 DIAGNOSIS — I639 Cerebral infarction, unspecified: Secondary | ICD-10-CM | POA: Diagnosis not present

## 2017-09-27 DIAGNOSIS — R634 Abnormal weight loss: Secondary | ICD-10-CM | POA: Diagnosis not present

## 2017-09-27 NOTE — Progress Notes (Signed)
Location:   [facility] Room Number: 208 A Place of Service:  SNF (31)   CODE STATUS: Full Code (Most form effective 07/29/15)  No Known Allergies  Chief Complaint  Patient presents with  . Acute Visit    Care Plan Meeting    HPI:  We have come together with her and her family this morning for a care plan meeting. she has been pulling out her peg tube; has been pulling apart her tube feeding and not allowing staff to reattach her feeding. Nursing staff reports that she has declined some medications as well. She has lost weight and is down to 112 pounds. She is unable to fully participate in the feeding. Her family states that she does not want continuous feeding. We will change her to bolus feedings. She continues to have oral pain; is awaiting teeth extraction.   Past Medical History:  Diagnosis Date  . Acute pulmonary embolism ([place]) 02/19/2015  . Acute respiratory failure ([place])   . Diabetes mellitus without complication (HCC)    Type 2, W/o complications  . DVT (deep venous thrombosis) ([place]) 04/05/2015  . Dysphagia   . Epilepsy ([place])   . GERD (gastroesophageal reflux disease)   . Hyperlipidemia   . Hypertension   . IBS (irritable bowel syndrome)   . Nontraumatic subarachnoid hemorrhage ([place])   . SAH (subarachnoid hemorrhage) ([place])   . Urinary retention     Past Surgical History:  Procedure Laterality Date  . ABDOMINAL SURGERY    . ANEURYSM COILING    . COLONOSCOPY N/A 02/20/2015   Procedure: COLONOSCOPY;  Surgeon: Gatha Mayer, MD;  Location: [facility];  Service: Endoscopy;  Laterality: N/A;  . ESOPHAGOGASTRODUODENOSCOPY (EGD) WITH PROPOFOL N/A 02/02/2015   Procedure: ESOPHAGOGASTRODUODENOSCOPY (EGD) WITH PROPOFOL;  Surgeon: Judeth Horn, MD;  Location: [facility];  Service: General;  Laterality: N/A;  . IR GENERIC HISTORICAL  08/26/2015   IR GASTRIC TUBE PERC CHG W/O IMG GUIDE 08/26/2015 Aletta Edouard, MD [facility]-INTER[facility] RAD  . IR GENERIC HISTORICAL   09/14/2015   IR REPLC GASTRO/COLONIC TUBE PERCUT W/FLUORO 09/14/2015 Darrell K Allred, PA-C [facility]  . IR REPLACE G-TUBE SIMPLE WO FLUORO  06/08/2016  . IR REPLACE G-TUBE SIMPLE WO FLUORO  10/24/2016  . IR REPLACE G-TUBE SIMPLE WO FLUORO  01/07/2017  . PEG PLACEMENT N/A 02/02/2015   Procedure: PERCUTANEOUS ENDOSCOPIC GASTROSTOMY (PEG) PLACEMENT;  Surgeon: Judeth Horn, MD;  Location: [facility];  Service: General;  Laterality: N/A;  . RADIOLOGY WITH ANESTHESIA N/A 01/13/2015   Procedure: RADIOLOGY WITH ANESTHESIA;  Surgeon: Consuella Lose, MD;  Location: [facility];  Service: Radiology;  Laterality: N/A;  . TRACHEOSTOMY      Social History   Socioeconomic History  . Marital status: Single    Spouse name: Not on file  . Number of children: Not on file  . Years of education: Not on file  . Highest education level: Not on file  Occupational History  . Not on file  Social Needs  . Financial resource strain: Not on file  . Food insecurity:    Worry: Not on file    Inability: Not on file  . Transportation needs:    Medical: Not on file    Non-medical: Not on file  Tobacco Use  . Smoking status: Former Research scientist (life sciences)  . Smokeless tobacco: Never Used  Substance and Sexual Activity  . Alcohol use: No  . Drug use: No  . Sexual activity: Not on file  Lifestyle  .  Physical activity:    Days per week: Not on file    Minutes per session: Not on file  . Stress: Not on file  Relationships  . Social connections:    Talks on phone: Not on file    Gets together: Not on file    Attends religious service: Not on file    Active member of club or organization: Not on file    Attends meetings of clubs or organizations: Not on file    Relationship status: Not on file  . Intimate partner violence:    Fear of current or ex partner: Not on file    Emotionally abused: Not on file    Physically abused: Not on file    Forced sexual activity: Not on file  Other Topics Concern  . Not on file  Social  History Narrative  . Not on file   Family History  Problem Relation Age of Onset  . Hypertension Other       VITAL SIGNS BP 120/60   Pulse 68   Temp (!) 97.1 F (36.2 C)   Resp 18   Ht 5' 5"  (1.651 m)   Wt 112 lb 12.8 oz (51.2 kg)   LMP  (LMP Unknown)   SpO2 96%   BMI 18.77 kg/m   Outpatient Encounter Medications as of 09/27/2017  Medication Sig  . acetaminophen (TYLENOL) 325 MG tablet Give 2 tablets via G-tube every 6 hours for pain and every 4 hours as needed  . Amino Acids-Protein Hydrolys (FEEDING SUPPLEMENT, PRO-STAT SUGAR FREE 64,) LIQD Place 30 mLs into feeding tube 2 (two) times daily.   . bethanechol (URECHOLINE) 10 MG tablet Place 10 mg into feeding tube 3 (three) times daily.   Marland Kitchen ENOXAPARIN SODIUM [place] Inject 50 mg into the skin every 12 (twelve) hours.   . gabapentin (NEURONTIN) 100 MG capsule Give 200 mg Per G-Tube two times daily HOLD IF LETHARGIC  . levETIRAcetam (KEPPRA) 100 MG/ML solution Place 5 mLs (500 mg total) into feeding tube 2 (two) times daily.  . metoprolol tartrate (LOPRESSOR) 25 MG tablet Place 25 mg into feeding tube 2 (two) times daily. Hold if SBP < 100 apical pulse  . Multiple Vitamin (MULTIVITAMIN) tablet Place 1 tablet into feeding tube daily.   . Nutritional Supplements (FEEDING SUPPLEMENT, JEVITY 1.5 CAL,) LIQD Jevity 1.5 at 55cc/hr.   On at 8am and off at 4pm. Flush with 200 cc water every 4 hours  . Nutritional Supplements (NUTRITIONAL SUPPLEMENT PO) Take by mouth. NPO - Nothing by mouth Diet -  Nothing by mouth texture  . polyethylene glycol (MIRALAX / GLYCOLAX) packet Place 17 g into feeding tube daily.   . Probiotic Product (PROBIOTIC DAILY PO) Place 1 capsule into feeding tube 2 (two) times daily.  . ranitidine (ZANTAC) 150 MG tablet Place 150 mg into feeding tube daily.   . rosuvastatin (CRESTOR) 10 MG tablet Place 10 mg into feeding tube daily.   . sertraline (ZOLOFT) 100 MG tablet Place 100 mg into feeding tube daily.   . traZODone  (DESYREL) 50 MG tablet Place 50 mg into feeding tube at bedtime.  . Water For Irrigation, Sterile (FREE WATER) SOLN Place 200 mLs into feeding tube 4 (four) times daily.   No facility-administered encounter medications on file as of 09/27/2017.      SIGNIFICANT DIAGNOSTIC EXAMS   PREVIOUS  05-06-17: ct angio of chest: 1. No pulmonary embolus identified. 2. Diffuse peribronchial thickening with debris in the mainstem bronchi and  extending into the lower lobes. Findings may represent acute bronchitis or aspiration given the distribution. No consolidation. 3. Mildly patulous esophagus. 4. Aorta and coronary artery calcific atherosclerosis. 05-08-17: 2-d echo: - Left ventricle: The cavity size was normal. There was mild concentric hypertrophy. Systolic function was vigorous. The estimated ejection fraction was in the range of 65% to 70%. Wall motion was normal; there were no regional wall motion abnormalities. Doppler parameters are consistent with abnormal left ventricular relaxation (grade 1 diastolic dysfunction). There was no evidence of elevated ventricular filling pressure by  Doppler parameters. - Aortic valve: There was no regurgitation. - Aortic root: The aortic root was normal in size. - Mitral valve: There was no regurgitation. - Left atrium: The atrium was normal in size. - Right ventricle: Systolic function was normal. - Right atrium: The atrium was normal in size. - Pulmonic valve: There was no regurgitation. - Inferior vena cava: The vessel was normal in size. - Pericardium, extracardiac: A mild anteriorly located pericardial effusion was identified. Features were not consistent with  tamponade physiology.  05-09-17: right foot x-ray: 1. No radiographic findings of osteomyelitis. 2. Osteoporosis.  05-12-17: MRI right knee: 1. Incomplete study as only three axial sequences were obtained. The patient refused further imaging due to pain. 2. No joint effusion, excluding septic  arthritis. 3. 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 No evidence of osteomyelitis.  05-13-17: MRI right ankle: 1. No osteomyelitis of right ankle. 2. No MRI evidence of septic arthritis of the right ankle. 3. Soft tissue ulcer overlying the posterior calcaneus.  06-17-17: mammogram: no evidence of malignancy  09-06-17: kub:  Contrast injected into the G-tube is noted filling the antrum of the stomach and proximal duodenum as expected.  09-06-17: chest x-ray: No acute cardiopulmonary process seen.  09-06-17: ct of abdomen and pelvis:  1. Gastrostomy tube in place in the stomach. Soft tissue strandingand small volume fluid/hematoma around the tube in the abdominalwall. No evidence of abscess. 2. No other evidence of acute abnormality in the abdomen or pelvis. 3.  Aortic Atherosclerosis   NO NEW EXAMS    LABS REVIEWED: PREVIOUS   10-11-16: glucose 133; bun 25.6; creat 0.63; k+ 4.8; na++ 140; ca 9.9; liver normal albumin 4.3 10-22-16: glucose 131; bun 29.0; creat 0.62; k+ 4.7; na++ 141; ca 9.8; liver normal albumin 4.3 01-06-17: wb 8.1; hgb 15.0; hct 45.0; mcv  93.2; plt 199; glucose 99; bun 20; creat 0.74; k+ 4.0; na++ 142; ca 10.7  01-25-17: hgb a1c 6.1; chol 187; ldl 117; trig 184; hdl 31  02-14-17: wbc 28.0; hgb 13.9; hct 41.0; mcv 90.5 ;plt 297  glucose 165; bun 34.4; creat 1.04; k+ 4.1; NA++ 156; ALT 38 AST 22; alk phos  133; albumin 4.2 blood culture: no growth urine culture no growth 02-14-17 (ED):  Wbc 33.7; hgb 13.4; hct 42.8; mcv 99.5; plt 279 glucose 210; bun 41; creat 1.37; k+ 3.6; na++ 155; ca 9.9; liver normal albumin 3.4  02-15-17: blood culture: no growth; flu: neg HIV: nr; hgb a1c 6.1 02-17-17: wbc 18.4; hgb 9.4; hct 31.6; mcv 97.8; plt 214; glucose 100; bun 23; creat 0.75; k+ 3.2; na++150 liver normal albumin 2.4 mag 2.3 phos 3.0  02-22-17: wbc 13.3; hgb 10.5; hct 33.6; mcv 94.6; plt 424; glucose 102; bun 16; creat 0.58; k+ 4.0; na++ 138; ca 9.9 02-24-17: wbc  11.2; hgb 11.2; hct  35.2; mcv 95.7; plt 387; glucose 115; bun 11; creat 0.57; k+ 4.2; na++ 138; ca 9.4 05-06-17: wbc  16.7; hgb 14.5; hct  47.9; mcv 96.8; plt 306; glucose 251; bun 68; creat 2.68; k+ 3.5; na++ 154; ca 9.1; blood culture: MRSA: urine culture: no growth 05-07-17: glucose 109; bun 60; creat 2.10; k+ 4.7; na++ 158; ca 7.4 ;liver normal albumin 2.3 05-09-17: wbc 10.0; hgb 11.8; hct 37.7; mcv 92.9; plt 210; glucose 121; bun 7; creat 0.77; k+ 3.0; na++ 142; ca 9.0; blood culture: no growth 05-10-17: wbc 8.5; hgb 11.0; hct 35.1; mcv 92.4; plt 214; glucose 120; bun 6; creat 0.87; k+ 3.2; na++ 143; ca 9.2; hepatitis: neg 05-12-17: wbc 8.9; hgb 11.6; hct 36.0; mcv 91.4; plt 231; glucose 113; bun 8; creat 0.70; k+ 3.6; na++ 141; ca 9.3 05-29-17: chol 80; ldl 31; trig 108; hdl 21; hgb a1c 6.1   06-03-17: wbc 8,8l hgb 12.1; hct 38; plt 209; glucose 119 bun 18; creat 0.6; k+ 5.2; na++147 chol 112; ldl 58; trig 112; hdl 29  08-08-17: wbc 9.0; hgb 12.4; hct 39.7; mcv 91.9; plt 290; glucose 105; bun 11; creat 0.83;  k+ 3.7; na++ 142 ca 10.4; liver normal albumin 3.8  09-06-17: wbc 1.1; hgb 12.2; hct 37.9; mcv 90.5; plt 273 glucose 101; bun 17; creat 0.94; k+ 4.2; na++ 141; liver normal albumin 4.0    urine culture no growth blood culture no growth 09-08-17: wbc  6.6; hgb 11.4; hct 36.5; mcv 93.4; plt 219; glucose 104; bun 14; creat 0.59; k+ 4.1; na++ 141; ca 9.4   NO NEW LABS.    Review of Systems  Unable to perform ROS: Other (CVA: nonverbal )    Physical Exam  Constitutional: No distress.  Frail   Neck: Carotid bruit is present. No thyromegaly present.  Bilateral   Cardiovascular: Normal rate, regular rhythm, normal heart sounds and intact distal pulses.  Pulmonary/Chest: Effort normal and breath sounds normal. No respiratory distress.  Abdominal: Soft. Bowel sounds are normal. She exhibits no distension. There is no tenderness.  Peg tube present without signs of infection present   Musculoskeletal: She exhibits no edema.    Left hemiplegia   Lymphadenopathy:    She has no cervical adenopathy.  Neurological: She is alert.  Skin: Skin is warm and dry. She is not diaphoretic.  Psychiatric: She has a normal mood and affect.     ASSESSMENT/ PLAN:  TODAY:   1.  CVA 2. Pharyngoesophageal dysphagia 3. Hemiplegia as late effect cerebrovascular accident 4. Weight loss, non-intentional  Will change her tube feeding to 1 can jevity 1.5 every 4 hours will continue her water flushes Will not make any medication changes Social service to further investigate her teeth extraction.   Time spent with patient and family 40 minutes: discussed current medical status; issues; and needs. Did verbalize understanding.    MD is aware of resident's narcotic use and is in agreement with current plan of care. We will attempt to wean resident as apropriate   Ok Edwards NP [facility]  Contact [phone] Monday through Friday 8am- 5pm  After hours call [phone]

## 2017-10-01 ENCOUNTER — Non-Acute Institutional Stay (SKILLED_NURSING_FACILITY): Payer: Medicaid Other | Admitting: Adult Health

## 2017-10-01 ENCOUNTER — Encounter: Payer: Self-pay | Admitting: Adult Health

## 2017-10-01 DIAGNOSIS — R339 Retention of urine, unspecified: Secondary | ICD-10-CM

## 2017-10-01 DIAGNOSIS — F5101 Primary insomnia: Secondary | ICD-10-CM | POA: Diagnosis not present

## 2017-10-01 DIAGNOSIS — K219 Gastro-esophageal reflux disease without esophagitis: Secondary | ICD-10-CM | POA: Diagnosis not present

## 2017-10-01 DIAGNOSIS — K5901 Slow transit constipation: Secondary | ICD-10-CM

## 2017-10-01 NOTE — Progress Notes (Signed)
Location:   [facility] Room Number: 208 A Place of Service:  SNF (31)   CODE STATUS: Full Code (Most form effective 07/29/15)  No Known Allergies  Chief Complaint  Patient presents with  . Medical Management of Chronic Issues    Gerd; constipation; urine retention; insomnia. Weekly follow up for the first 30 days post hospitalization.     HPI:  She is a 52 year old long term resident of this facility being seen for the management of her chronic illnesses; gerd; constipation; urine retention; insomnia. She is unable to participate in the hpi or ros. She is tolerating her bolus feedings easily. There are no reports of her declining her tube feedings. There are no reports of insomnia; or uncontrolled pain.   Past Medical History:  Diagnosis Date  . Acute pulmonary embolism ([place]) 02/19/2015  . Acute respiratory failure ([place])   . Diabetes mellitus without complication (HCC)    Type 2, W/o complications  . DVT (deep venous thrombosis) ([place]) 04/05/2015  . Dysphagia   . Epilepsy ([place])   . GERD (gastroesophageal reflux disease)   . Hyperlipidemia   . Hypertension   . IBS (irritable bowel syndrome)   . Nontraumatic subarachnoid hemorrhage ([place])   . SAH (subarachnoid hemorrhage) ([place])   . Urinary retention     Past Surgical History:  Procedure Laterality Date  . ABDOMINAL SURGERY    . ANEURYSM COILING    . COLONOSCOPY N/A 02/20/2015   Procedure: COLONOSCOPY;  Surgeon: Gatha Mayer, MD;  Location: [facility];  Service: Endoscopy;  Laterality: N/A;  . ESOPHAGOGASTRODUODENOSCOPY (EGD) WITH PROPOFOL N/A 02/02/2015   Procedure: ESOPHAGOGASTRODUODENOSCOPY (EGD) WITH PROPOFOL;  Surgeon: Judeth Horn, MD;  Location: [facility];  Service: General;  Laterality: N/A;  . IR GENERIC HISTORICAL  08/26/2015   IR GASTRIC TUBE PERC CHG W/O IMG GUIDE 08/26/2015 Aletta Edouard, MD [facility]  . IR GENERIC HISTORICAL  09/14/2015   IR REPLC GASTRO/COLONIC TUBE PERCUT W/FLUORO  09/14/2015 Darrell K Allred, PA-C [facility]  . IR REPLACE G-TUBE SIMPLE WO FLUORO  06/08/2016  . IR REPLACE G-TUBE SIMPLE WO FLUORO  10/24/2016  . IR REPLACE G-TUBE SIMPLE WO FLUORO  01/07/2017  . PEG PLACEMENT N/A 02/02/2015   Procedure: PERCUTANEOUS ENDOSCOPIC GASTROSTOMY (PEG) PLACEMENT;  Surgeon: Judeth Horn, MD;  Location: [facility];  Service: General;  Laterality: N/A;  . RADIOLOGY WITH ANESTHESIA N/A 01/13/2015   Procedure: RADIOLOGY WITH ANESTHESIA;  Surgeon: Consuella Lose, MD;  Location: [facility];  Service: Radiology;  Laterality: N/A;  . TRACHEOSTOMY      Social History   Socioeconomic History  . Marital status: Single    Spouse name: Not on file  . Number of children: Not on file  . Years of education: Not on file  . Highest education level: Not on file  Occupational History  . Not on file  Social Needs  . Financial resource strain: Not on file  . Food insecurity:    Worry: Not on file    Inability: Not on file  . Transportation needs:    Medical: Not on file    Non-medical: Not on file  Tobacco Use  . Smoking status: Former Research scientist (life sciences)  . Smokeless tobacco: Never Used  Substance and Sexual Activity  . Alcohol use: No  . Drug use: No  . Sexual activity: Not on file  Lifestyle  . Physical activity:    Days per week: Not on file    Minutes per session: Not  on file  . Stress: Not on file  Relationships  . Social connections:    Talks on phone: Not on file    Gets together: Not on file    Attends religious service: Not on file    Active member of club or organization: Not on file    Attends meetings of clubs or organizations: Not on file    Relationship status: Not on file  . Intimate partner violence:    Fear of current or ex partner: Not on file    Emotionally abused: Not on file    Physically abused: Not on file    Forced sexual activity: Not on file  Other Topics Concern  . Not on file  Social History Narrative  . Not on file   Family History    Problem Relation Age of Onset  . Hypertension Other       VITAL SIGNS BP 120/60   Pulse 68   Temp (!) 97.1 F (36.2 C)   Resp 18   Ht _0  (1.651 m)   Wt 112 lb 12.8 oz (51.2 kg)   LMP  (LMP Unknown)   SpO2 96%   BMI 18.77 kg/m   Outpatient Encounter Medications as of 10/01/2017  Medication Sig  . acetaminophen (TYLENOL) 325 MG tablet Give 2 tablets via G-tube every 6 hours for pain and every 4 hours as needed  . Amino Acids-Protein Hydrolys (FEEDING SUPPLEMENT, PRO-STAT SUGAR FREE 64,) LIQD Place 30 mLs into feeding tube 2 (two) times daily.   . bethanechol (URECHOLINE) 10 MG tablet Place 10 mg into feeding tube 3 (three) times daily.   Marland Kitchen ENOXAPARIN SODIUM Coke Inject 50 mg into the skin every 12 (twelve) hours.   . gabapentin (NEURONTIN) 100 MG capsule Give 200 mg Per G-Tube two times daily HOLD IF LETHARGIC  . levETIRAcetam (KEPPRA) 100 MG/ML solution Place 5 mLs (500 mg total) into feeding tube 2 (two) times daily.  . metoprolol tartrate (LOPRESSOR) 25 MG tablet Place 25 mg into feeding tube 2 (two) times daily. Hold if SBP < 100 apical pulse  . Multiple Vitamin (MULTIVITAMIN) tablet Place 1 tablet into feeding tube daily.   . Nutritional Supplements (FEEDING SUPPLEMENT, JEVITY 1.5 CAL,) LIQD Jevity 1.5 at 55cc/hr.   On at 8am and off at 4pm. Flush with 200 cc water every 4 hours  . Nutritional Supplements (NUTRITIONAL SUPPLEMENT PO) Take by mouth. NPO - Nothing by mouth Diet -  Nothing by mouth texture  . polyethylene glycol (MIRALAX / GLYCOLAX) packet Place 17 g into feeding tube daily.   . Probiotic Product (PROBIOTIC DAILY PO) Place 1 capsule into feeding tube 2 (two) times daily.  . ranitidine (ZANTAC) 150 MG tablet Place 150 mg into feeding tube daily.   . rosuvastatin (CRESTOR) 5 MG tablet Place 5 mg into feeding tube daily.   . sertraline (ZOLOFT) 100 MG tablet Place 100 mg into feeding tube daily.   . traZODone (DESYREL) 50 MG tablet Place 50 mg into feeding tube at  bedtime.  . Water For Irrigation, Sterile (FREE WATER) SOLN Place 200 mLs into feeding tube 4 (four) times daily.   No facility-administered encounter medications on file as of 10/01/2017.      SIGNIFICANT DIAGNOSTIC EXAMS   PREVIOUS  05-06-17: ct angio of chest: 1. No pulmonary embolus identified. 2. Diffuse peribronchial thickening with debris in the mainstem bronchi and extending into the lower lobes. Findings may represent acute bronchitis or aspiration given the distribution. No consolidation.  3. Mildly patulous esophagus. 4. Aorta and coronary artery calcific atherosclerosis. 05-08-17: 2-d echo: - Left ventricle: The cavity size was normal. There was mild concentric hypertrophy. Systolic function was vigorous. The estimated ejection fraction was in the range of 65% to 70%. Wall motion was normal; there were no regional wall motion abnormalities. Doppler parameters are consistent with abnormal left ventricular relaxation (grade 1 diastolic dysfunction). There was no evidence of elevated ventricular filling pressure by  Doppler parameters. - Aortic valve: There was no regurgitation. - Aortic root: The aortic root was normal in size. - Mitral valve: There was no regurgitation. - Left atrium: The atrium was normal in size. - Right ventricle: Systolic function was normal. - Right atrium: The atrium was normal in size. - Pulmonic valve: There was no regurgitation. - Inferior vena cava: The vessel was normal in size. - Pericardium, extracardiac: A mild anteriorly located pericardial effusion was identified. Features were not consistent with  tamponade physiology.  05-09-17: right foot x-ray: 1. No radiographic findings of osteomyelitis. 2. Osteoporosis.  05-12-17: MRI right knee: 1. Incomplete study as only three axial sequences were obtained. The patient refused further imaging due to pain. 2. No joint effusion, excluding septic arthritis. 3. 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 No  evidence of osteomyelitis.  05-13-17: MRI right ankle: 1. No osteomyelitis of right ankle. 2. No MRI evidence of septic arthritis of the right ankle. 3. Soft tissue ulcer overlying the posterior calcaneus.  06-17-17: mammogram: no evidence of malignancy  09-06-17: kub:  Contrast injected into the G-tube is noted filling the antrum of the stomach and proximal duodenum as expected.  09-06-17: chest x-ray: No acute cardiopulmonary process seen.  09-06-17: ct of abdomen and pelvis:  1. Gastrostomy tube in place in the stomach. Soft tissue strandingand small volume fluid/hematoma around the tube in the abdominalwall. No evidence of abscess. 2. No other evidence of acute abnormality in the abdomen or pelvis. 3.  Aortic Atherosclerosis   NO NEW EXAMS    LABS REVIEWED: PREVIOUS   10-11-16: glucose 133; bun 25.6; creat 0.63; k+ 4.8; na++ 140; ca 9.9; liver normal albumin 4.3 10-22-16: glucose 131; bun 29.0; creat 0.62; k+ 4.7; na++ 141; ca 9.8; liver normal albumin 4.3 01-06-17: wb 8.1; hgb 15.0; hct 45.0; mcv  93.2; plt 199; glucose 99; bun 20; creat 0.74; k+ 4.0; na++ 142; ca 10.7  01-25-17: hgb a1c 6.1; chol 187; ldl 117; trig 184; hdl 31  02-14-17: wbc 28.0; hgb 13.9; hct 41.0; mcv 90.5 ;plt 297  glucose 165; bun 34.4; creat 1.04; k+ 4.1; NA++ 156; ALT 38 AST 22; alk phos  133; albumin 4.2 blood culture: no growth urine culture no growth 02-14-17 (ED):  Wbc 33.7; hgb 13.4; hct 42.8; mcv 99.5; plt 279 glucose 210; bun 41; creat 1.37; k+ 3.6; na++ 155; ca 9.9; liver normal albumin 3.4  02-15-17: blood culture: no growth; flu: neg HIV: nr; hgb a1c 6.1 02-17-17: wbc 18.4; hgb 9.4; hct 31.6; mcv 97.8; plt 214; glucose 100; bun 23; creat 0.75; k+ 3.2; na++150 liver normal albumin 2.4 mag 2.3 phos 3.0  02-22-17: wbc 13.3; hgb 10.5; hct 33.6; mcv 94.6; plt 424; glucose 102; bun 16; creat 0.58; k+ 4.0; na++ 138; ca 9.9 02-24-17: wbc  11.2; hgb 11.2; hct 35.2; mcv 95.7; plt 387; glucose 115; bun 11; creat 0.57; k+  4.2; na++ 138; ca 9.4 05-06-17: wbc 16.7; hgb 14.5; hct  47.9; mcv 96.8; plt 306; glucose 251; bun 68; creat 2.68; k+  3.5; na++ 154; ca 9.1; blood culture: MRSA: urine culture: no growth 05-07-17: glucose 109; bun 60; creat 2.10; k+ 4.7; na++ 158; ca 7.4 ;liver normal albumin 2.3 05-09-17: wbc 10.0; hgb 11.8; hct 37.7; mcv 92.9; plt 210; glucose 121; bun 7; creat 0.77; k+ 3.0; na++ 142; ca 9.0; blood culture: no growth 05-10-17: wbc 8.5; hgb 11.0; hct 35.1; mcv 92.4; plt 214; glucose 120; bun 6; creat 0.87; k+ 3.2; na++ 143; ca 9.2; hepatitis: neg 05-12-17: wbc 8.9; hgb 11.6; hct 36.0; mcv 91.4; plt 231; glucose 113; bun 8; creat 0.70; k+ 3.6; na++ 141; ca 9.3 05-29-17: chol 80; ldl 31; trig 108; hdl 21; hgb a1c 6.1   06-03-17: wbc 8,8l hgb 12.1; hct 38; plt 209; glucose 119 bun 18; creat 0.6; k+ 5.2; na++147 chol 112; ldl 58; trig 112; hdl 29  08-08-17: wbc 9.0; hgb 12.4; hct 39.7; mcv 91.9; plt 290; glucose 105; bun 11; creat 0.83;  k+ 3.7; na++ 142 ca 10.4; liver normal albumin 3.8  09-06-17: wbc 1.1; hgb 12.2; hct 37.9; mcv 90.5; plt 273 glucose 101; bun 17; creat 0.94; k+ 4.2; na++ 141; liver normal albumin 4.0    urine culture no growth blood culture no growth 09-08-17: wbc  6.6; hgb 11.4; hct 36.5; mcv 93.4; plt 219; glucose 104; bun 14; creat 0.59; k+ 4.1; na++ 141; ca 9.4   NO NEW LABS.    Review of Systems  Unable to perform ROS: Patient nonverbal    Physical Exam  Constitutional: No distress.  Frail   Neck: Carotid bruit is present. No thyromegaly present.  Bilateral   Cardiovascular: Normal rate, regular rhythm, normal heart sounds and intact distal pulses.  Pulmonary/Chest: Effort normal and breath sounds normal. No respiratory distress.  Abdominal: Soft. Bowel sounds are normal. She exhibits no distension. There is no tenderness.  Peg tube present without signs of infection present.   Musculoskeletal: She exhibits no edema.  Hemiplegia   Lymphadenopathy:    She has no cervical  adenopathy.  Neurological: She is alert.  Skin: Skin is warm and dry. She is not diaphoretic.  Psychiatric: She has a normal mood and affect.     ASSESSMENT/ PLAN:  TODAY  1. gerd without esophagitis: stable will continue zantac 150 mg daily   2. Slow transit constipation: is status post SBO: will continue miralax 17 gm daily   3. Insomnia: is stable will continue trazodone 50 mg nightly   4. Urine retention: stable will continue urecholine 10 mg three times daily  ; will not make changes will not make changes.   PREVIOUS   5. Type 2 diabetes mellitus with neurological manifestations: is stable  hgb a1c is 6.1 (previous 5.4) ; is currently off medications; will continue to monitor her status.  ldl is 85 is on statin.   6. Essential benign hypertension with supraventricular tachycardia:  stable: b/p 120/60  pulse: 74: will continue lopressor 25 mg twice daily   7.  Dysphagia paraesophageal  phase : stable  no signs of aspiration present she is npo; she is dependent upon peg tube feeding. She is doing well with the bolus feedings.   8. 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  Will continue lovenox 50 mg twice daily   9. Seizures: is stable  no reports of seizure activity present: will continue keppra 500 mg twice daily and will monitor   10. DVT (chronic left lower extremity)  and chronic pulmonary embolism: is stable  she does require long term anticoagulation therapy; will continue lovenox 50  mg twice daily (weigt based); is not a candidate for xarelto; or eliquis due to her history of GI bleed; she is not appropriate for coumadin therapy; unable  to obtain adequate INR.  11. Weight loss: is worse; her current weight is 112 pounds. She has been pulling her tube feeding apart and has been pulling her peg tube out. Will continue prostat to 30 cc twice daily to help with nutritional  support   12. Dyslipidemia associated with type 2 diabetes mellitus: stable ldl 58  Will continue crestor  5 mg daily due to her weight loss.     MD is aware of resident's narcotic use and is in agreement with current plan of care. We will attempt to wean resident as apropriate   Ok Edwards NP [facility]  Contact [phone] Monday through Friday 8am- 5pm  After hours call [phone]

## 2017-10-08 ENCOUNTER — Non-Acute Institutional Stay (SKILLED_NURSING_FACILITY): Payer: Medicaid Other | Admitting: Adult Health

## 2017-10-08 ENCOUNTER — Encounter: Payer: Self-pay | Admitting: Adult Health

## 2017-10-08 DIAGNOSIS — I10 Essential (primary) hypertension: Secondary | ICD-10-CM

## 2017-10-08 DIAGNOSIS — I69359 Hemiplegia and hemiparesis following cerebral infarction affecting unspecified side: Secondary | ICD-10-CM

## 2017-10-08 DIAGNOSIS — I471 Supraventricular tachycardia: Secondary | ICD-10-CM

## 2017-10-08 DIAGNOSIS — R1314 Dysphagia, pharyngoesophageal phase: Secondary | ICD-10-CM | POA: Diagnosis not present

## 2017-10-08 DIAGNOSIS — E1149 Type 2 diabetes mellitus with other diabetic neurological complication: Secondary | ICD-10-CM

## 2017-10-08 DIAGNOSIS — I639 Cerebral infarction, unspecified: Secondary | ICD-10-CM

## 2017-10-08 NOTE — Progress Notes (Signed)
Location:   [facility] Room Number: 208 A Place of Service:  SNF (31)   CODE STATUS: Full Code (Most form effective 07/29/15)  No Known Allergies  Chief Complaint  Patient presents with  . Medical Management of Chronic Issues    Cva; hypertension; dysphagia; diabetes. Weekly follow up for the first 30 days post hospitalization.     HPI:  She is a 52 year old long term resident of this facility being seen for the management of her chronic illnesses; cva; hypertension; dysphagia; diabetes. She is unable to participate in the hpi or ros. There are no reports of her declining her tube feeding. She has gained 2 pounds. There are no reports of uncontrolled pain or agitation. There are no nursing concerns at this time.   Past Medical History:  Diagnosis Date  . Acute pulmonary embolism ([place]) 02/19/2015  . Acute respiratory failure ([place])   . Diabetes mellitus without complication (HCC)    Type 2, W/o complications  . DVT (deep venous thrombosis) ([place]) 04/05/2015  . Dysphagia   . Epilepsy ([place])   . GERD (gastroesophageal reflux disease)   . Hyperlipidemia   . Hypertension   . IBS (irritable bowel syndrome)   . Nontraumatic subarachnoid hemorrhage ([place])   . SAH (subarachnoid hemorrhage) ([place])   . Urinary retention     Past Surgical History:  Procedure Laterality Date  . ABDOMINAL SURGERY    . ANEURYSM COILING    . COLONOSCOPY N/A 02/20/2015   Procedure: COLONOSCOPY;  Surgeon: Gatha Mayer, MD;  Location: [facility];  Service: Endoscopy;  Laterality: N/A;  . ESOPHAGOGASTRODUODENOSCOPY (EGD) WITH PROPOFOL N/A 02/02/2015   Procedure: ESOPHAGOGASTRODUODENOSCOPY (EGD) WITH PROPOFOL;  Surgeon: Judeth Horn, MD;  Location: [facility];  Service: General;  Laterality: N/A;  . IR GENERIC HISTORICAL  08/26/2015   IR GASTRIC TUBE PERC CHG W/O IMG GUIDE 08/26/2015 Aletta Edouard, MD [facility]  . IR GENERIC HISTORICAL  09/14/2015   IR REPLC GASTRO/COLONIC TUBE PERCUT  W/FLUORO 09/14/2015 Darrell K Allred, PA-C [facility]  . IR REPLACE G-TUBE SIMPLE WO FLUORO  06/08/2016  . IR REPLACE G-TUBE SIMPLE WO FLUORO  10/24/2016  . IR REPLACE G-TUBE SIMPLE WO FLUORO  01/07/2017  . PEG PLACEMENT N/A 02/02/2015   Procedure: PERCUTANEOUS ENDOSCOPIC GASTROSTOMY (PEG) PLACEMENT;  Surgeon: Judeth Horn, MD;  Location: [facility];  Service: General;  Laterality: N/A;  . RADIOLOGY WITH ANESTHESIA N/A 01/13/2015   Procedure: RADIOLOGY WITH ANESTHESIA;  Surgeon: Consuella Lose, MD;  Location: [facility];  Service: Radiology;  Laterality: N/A;  . TRACHEOSTOMY      Social History   Socioeconomic History  . Marital status: Single    Spouse name: Not on file  . Number of children: Not on file  . Years of education: Not on file  . Highest education level: Not on file  Occupational History  . Not on file  Social Needs  . Financial resource strain: Not on file  . Food insecurity:    Worry: Not on file    Inability: Not on file  . Transportation needs:    Medical: Not on file    Non-medical: Not on file  Tobacco Use  . Smoking status: Former Research scientist (life sciences)  . Smokeless tobacco: Never Used  Substance and Sexual Activity  . Alcohol use: No  . Drug use: No  . Sexual activity: Not on file  Lifestyle  . Physical activity:    Days per week: Not on file  Minutes per session: Not on file  . Stress: Not on file  Relationships  . Social connections:    Talks on phone: Not on file    Gets together: Not on file    Attends religious service: Not on file    Active member of club or organization: Not on file    Attends meetings of clubs or organizations: Not on file    Relationship status: Not on file  . Intimate partner violence:    Fear of current or ex partner: Not on file    Emotionally abused: Not on file    Physically abused: Not on file    Forced sexual activity: Not on file  Other Topics Concern  . Not on file  Social History Narrative  . Not on file   Family  History  Problem Relation Age of Onset  . Hypertension Other       VITAL SIGNS BP 113/81   Pulse 79   Temp 98.1 F (36.7 C)   Resp 18   Ht 5' 5"  (1.651 m)   Wt 114 lb 12.8 oz (52.1 kg)   LMP  (LMP Unknown)   SpO2 97%   BMI 19.10 kg/m   Outpatient Encounter Medications as of 10/08/2017  Medication Sig  . acetaminophen (TYLENOL) 325 MG tablet Give 2 tablets via G-tube every 6 hours for pain routinely and every 4 hours as needed  . Amino Acids-Protein Hydrolys (FEEDING SUPPLEMENT, PRO-STAT SUGAR FREE 64,) LIQD Place 30 mLs into feeding tube 2 (two) times daily.   . bethanechol (URECHOLINE) 10 MG tablet Place 10 mg into feeding tube 3 (three) times daily.   Marland Kitchen ENOXAPARIN SODIUM [place] Inject 50 mg into the skin every 12 (twelve) hours.   . gabapentin (NEURONTIN) 100 MG capsule Give 200 mg Per G-Tube two times daily HOLD IF LETHARGIC  . levETIRAcetam (KEPPRA) 100 MG/ML solution Place 5 mLs (500 mg total) into feeding tube 2 (two) times daily.  . metoprolol tartrate (LOPRESSOR) 25 MG tablet Place 25 mg into feeding tube 2 (two) times daily. Hold if SBP < 100 apical pulse  . Multiple Vitamin (MULTIVITAMIN) tablet Place 1 tablet into feeding tube daily.   . Nutritional Supplements (FEEDING SUPPLEMENT, JEVITY 1.5 CAL,) LIQD Administer Jevity 1.5 @ 200 ml every 4 hours daily  Flush tube with 30 ml water prior and post administration  . Nutritional Supplements (NUTRITIONAL SUPPLEMENT PO) Take by mouth. NPO - Nothing by mouth Diet -  Nothing by mouth texture  . polyethylene glycol (MIRALAX / GLYCOLAX) packet Place 17 g into feeding tube daily.   . Probiotic Product (PROBIOTIC DAILY PO) Place 1 capsule into feeding tube 2 (two) times daily.  . ranitidine (ZANTAC) 150 MG tablet Place 150 mg into feeding tube daily.   . rosuvastatin (CRESTOR) 5 MG tablet Place 5 mg into feeding tube daily.   . sertraline (ZOLOFT) 100 MG tablet Place 100 mg into feeding tube daily.   . traZODone (DESYREL) 50 MG  tablet Place 50 mg into feeding tube at bedtime.  . Water For Irrigation, Sterile (STERILE WATER FOR IRRIGATION) 200 cc water flushes via g-tube every 4 hours  . [DISCONTINUED] Water For Irrigation, Sterile (FREE WATER) SOLN Place 200 mLs into feeding tube 4 (four) times daily. (Patient not taking: Reported on 10/08/2017)   No facility-administered encounter medications on file as of 10/08/2017.      SIGNIFICANT DIAGNOSTIC EXAMS   PREVIOUS  05-06-17: ct angio of chest: 1. No pulmonary embolus  identified. 2. Diffuse peribronchial thickening with debris in the mainstem bronchi and extending into the lower lobes. Findings may represent acute bronchitis or aspiration given the distribution. No consolidation. 3. Mildly patulous esophagus. 4. Aorta and coronary artery calcific atherosclerosis. 05-08-17: 2-d echo: - Left ventricle: The cavity size was normal. There was mild concentric hypertrophy. Systolic function was vigorous. The estimated ejection fraction was in the range of 65% to 70%. Wall motion was normal; there were no regional wall motion abnormalities. Doppler parameters are consistent with abnormal left ventricular relaxation (grade 1 diastolic dysfunction). There was no evidence of elevated ventricular filling pressure by  Doppler parameters. - Aortic valve: There was no regurgitation. - Aortic root: The aortic root was normal in size. - Mitral valve: There was no regurgitation. - Left atrium: The atrium was normal in size. - Right ventricle: Systolic function was normal. - Right atrium: The atrium was normal in size. - Pulmonic valve: There was no regurgitation. - Inferior vena cava: The vessel was normal in size. - Pericardium, extracardiac: A mild anteriorly located pericardial effusion was identified. Features were not consistent with  tamponade physiology.  05-09-17: right foot x-ray: 1. No radiographic findings of osteomyelitis. 2. Osteoporosis.  05-12-17: MRI right knee: 1.  Incomplete study as only three axial sequences were obtained. The patient refused further imaging due to pain. 2. No joint effusion, excluding septic arthritis. 3. 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 No evidence of osteomyelitis.  05-13-17: MRI right ankle: 1. No osteomyelitis of right ankle. 2. No MRI evidence of septic arthritis of the right ankle. 3. Soft tissue ulcer overlying the posterior calcaneus.  06-17-17: mammogram: no evidence of malignancy  09-06-17: kub:  Contrast injected into the G-tube is noted filling the antrum of the stomach and proximal duodenum as expected.  09-06-17: chest x-ray: No acute cardiopulmonary process seen.  09-06-17: ct of abdomen and pelvis:  1. Gastrostomy tube in place in the stomach. Soft tissue strandingand small volume fluid/hematoma around the tube in the abdominalwall. No evidence of abscess. 2. No other evidence of acute abnormality in the abdomen or pelvis. 3.  Aortic Atherosclerosis   NO NEW EXAMS    LABS REVIEWED: PREVIOUS   10-11-16: glucose 133; bun 25.6; creat 0.63; k+ 4.8; na++ 140; ca 9.9; liver normal albumin 4.3 10-22-16: glucose 131; bun 29.0; creat 0.62; k+ 4.7; na++ 141; ca 9.8; liver normal albumin 4.3 01-06-17: wb 8.1; hgb 15.0; hct 45.0; mcv  93.2; plt 199; glucose 99; bun 20; creat 0.74; k+ 4.0; na++ 142; ca 10.7  01-25-17: hgb a1c 6.1; chol 187; ldl 117; trig 184; hdl 31  02-14-17: wbc 28.0; hgb 13.9; hct 41.0; mcv 90.5 ;plt 297  glucose 165; bun 34.4; creat 1.04; k+ 4.1; NA++ 156; ALT 38 AST 22; alk phos  133; albumin 4.2 blood culture: no growth urine culture no growth 02-14-17 (ED):  Wbc 33.7; hgb 13.4; hct 42.8; mcv 99.5; plt 279 glucose 210; bun 41; creat 1.37; k+ 3.6; na++ 155; ca 9.9; liver normal albumin 3.4  02-15-17: blood culture: no growth; flu: neg HIV: nr; hgb a1c 6.1 02-17-17: wbc 18.4; hgb 9.4; hct 31.6; mcv 97.8; plt 214; glucose 100; bun 23; creat 0.75; k+ 3.2; na++150 liver normal albumin 2.4 mag 2.3 phos 3.0    02-22-17: wbc 13.3; hgb 10.5; hct 33.6; mcv 94.6; plt 424; glucose 102; bun 16; creat 0.58; k+ 4.0; na++ 138; ca 9.9 02-24-17: wbc  11.2; hgb 11.2; hct 35.2; mcv 95.7; plt 387; glucose  115; bun 11; creat 0.57; k+ 4.2; na++ 138; ca 9.4 05-06-17: wbc 16.7; hgb 14.5; hct  47.9; mcv 96.8; plt 306; glucose 251; bun 68; creat 2.68; k+ 3.5; na++ 154; ca 9.1; blood culture: MRSA: urine culture: no growth 05-07-17: glucose 109; bun 60; creat 2.10; k+ 4.7; na++ 158; ca 7.4 ;liver normal albumin 2.3 05-09-17: wbc 10.0; hgb 11.8; hct 37.7; mcv 92.9; plt 210; glucose 121; bun 7; creat 0.77; k+ 3.0; na++ 142; ca 9.0; blood culture: no growth 05-10-17: wbc 8.5; hgb 11.0; hct 35.1; mcv 92.4; plt 214; glucose 120; bun 6; creat 0.87; k+ 3.2; na++ 143; ca 9.2; hepatitis: neg 05-12-17: wbc 8.9; hgb 11.6; hct 36.0; mcv 91.4; plt 231; glucose 113; bun 8; creat 0.70; k+ 3.6; na++ 141; ca 9.3 05-29-17: chol 80; ldl 31; trig 108; hdl 21; hgb a1c 6.1   06-03-17: wbc 8,8l hgb 12.1; hct 38; plt 209; glucose 119 bun 18; creat 0.6; k+ 5.2; na++147 chol 112; ldl 58; trig 112; hdl 29  08-08-17: wbc 9.0; hgb 12.4; hct 39.7; mcv 91.9; plt 290; glucose 105; bun 11; creat 0.83;  k+ 3.7; na++ 142 ca 10.4; liver normal albumin 3.8  09-06-17: wbc 1.1; hgb 12.2; hct 37.9; mcv 90.5; plt 273 glucose 101; bun 17; creat 0.94; k+ 4.2; na++ 141; liver normal albumin 4.0    urine culture no growth blood culture no growth 09-08-17: wbc  6.6; hgb 11.4; hct 36.5; mcv 93.4; plt 219; glucose 104; bun 14; creat 0.59; k+ 4.1; na++ 141; ca 9.4   NO NEW LABS.    Review of Systems  Reason unable to perform ROS: aphasia    Physical Exam  Constitutional: She appears well-developed and well-nourished. No distress.  Neck: Carotid bruit is present. No thyromegaly present.  Bilateral   Cardiovascular: Normal rate, regular rhythm, normal heart sounds and intact distal pulses.  Pulmonary/Chest: Effort normal and breath sounds normal. No respiratory distress.  Abdominal:  Soft. Bowel sounds are normal. She exhibits no distension. There is no tenderness.  Peg tube presently without signs of infection present.   Musculoskeletal: She exhibits no edema.  Left hemiplegia   Lymphadenopathy:    She has no cervical adenopathy.  Neurological: She is alert.  Skin: Skin is warm and dry. She is not diaphoretic.  Psychiatric: She has a normal mood and affect.      ASSESSMENT/ PLAN:  TODAY  1. Type 2 diabetes mellitus with neurological manifestations: is stable  hgb a1c is 6.1 (previous 5.4) ; is currently off medications; will continue to monitor her status.  ldl is 85 is on statin.   2. Essential benign hypertension with supraventricular tachycardia:  stable: b/p 113/81  pulse: 79: will continue lopressor 25 mg twice daily   3.  Dysphagia paraesophageal  phase : stable  no signs of aspiration present she is npo; she is dependent upon peg tube feeding. She is doing well with the bolus feedings.   4. 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  Will continue lovenox 50 mg twice daily   PREVIOUS   5. Seizures: is stable  no reports of seizure activity present: will continue keppra 500 mg twice daily and will monitor   6. DVT (chronic left lower extremity) and chronic pulmonary embolism: is stable  she does require long term anticoagulation therapy; will continue lovenox 50  mg twice daily (weigt based); is not a candidate for xarelto; or  eliquis due to her history of GI bleed; she is not appropriate for coumadin therapy; unable  to obtain adequate INR.  7. Weight loss: is slightly improved ; her current weight is 114 pounds.  Will continue prostat to 30 cc twice daily to help with nutritional support   8. Dyslipidemia associated with type 2 diabetes mellitus: stable ldl 58  Will continue crestor  5 mg daily due to her weight loss.   9. gerd without esophagitis:  stable will continue zantac 150 mg daily   10. Slow transit constipation: is status post SBO: will continue miralax 17 gm daily   11. Insomnia: is stable will continue trazodone 50 mg nightly   12. Urine retention: stable will continue urecholine 10 mg three times daily  ; will not make changes will not make changes.           MD is aware of resident's narcotic use and is in agreement with current plan of care. We will attempt to wean resident as apropriate   Ok Edwards NP [facility]  Contact [phone] Monday through Friday 8am- 5pm  After hours call [phone]

## 2017-10-09 ENCOUNTER — Encounter: Payer: Self-pay | Admitting: Adult Health

## 2017-10-09 NOTE — Progress Notes (Signed)
Entered in error

## 2017-10-18 ENCOUNTER — Non-Acute Institutional Stay (SKILLED_NURSING_FACILITY): Payer: Medicaid Other | Admitting: Adult Health

## 2017-10-18 DIAGNOSIS — I825Y2 Chronic embolism and thrombosis of unspecified deep veins of left proximal lower extremity: Secondary | ICD-10-CM

## 2017-10-18 DIAGNOSIS — I2782 Chronic pulmonary embolism: Secondary | ICD-10-CM | POA: Diagnosis not present

## 2017-10-18 DIAGNOSIS — E785 Hyperlipidemia, unspecified: Secondary | ICD-10-CM

## 2017-10-18 DIAGNOSIS — E1169 Type 2 diabetes mellitus with other specified complication: Secondary | ICD-10-CM | POA: Diagnosis not present

## 2017-10-18 DIAGNOSIS — R569 Unspecified convulsions: Secondary | ICD-10-CM | POA: Diagnosis not present

## 2017-10-18 DIAGNOSIS — R634 Abnormal weight loss: Secondary | ICD-10-CM

## 2017-10-18 NOTE — Progress Notes (Signed)
Location:   [facility] Room Number: 208 Place of Service:  SNF (31)   CODE STATUS: full code   No Known Allergies  Chief Complaint  Patient presents with  . Medical Management of Chronic Issues    Dvt/pe; dyslipidemia; seizures; weight loss.     HPI:  She is a 52 year old long term resident of this facility being seen for the management of her chronic illnesses; dvt/pe; dyslipidemia; seizures; weight loss. She is unable to participate in the hpi or ros. There are no reports of agitation; no fevers; no signs of aspiration present. She is slowly regaining the weight that she has lost  Past Medical History:  Diagnosis Date  . Acute pulmonary embolism ([place]) 02/19/2015  . Acute respiratory failure ([place])   . Diabetes mellitus without complication (HCC)    Type 2, W/o complications  . DVT (deep venous thrombosis) ([place]) 04/05/2015  . Dysphagia   . Epilepsy ([place])   . GERD (gastroesophageal reflux disease)   . Hyperlipidemia   . Hypertension   . IBS (irritable bowel syndrome)   . Nontraumatic subarachnoid hemorrhage ([place])   . SAH (subarachnoid hemorrhage) ([name])   . Urinary retention     Past Surgical History:  Procedure Laterality Date  . ABDOMINAL SURGERY    . ANEURYSM COILING    . COLONOSCOPY N/A 02/20/2015   Procedure: COLONOSCOPY;  Surgeon: Gatha Mayer, MD;  Location: [facility];  Service: Endoscopy;  Laterality: N/A;  . ESOPHAGOGASTRODUODENOSCOPY (EGD) WITH PROPOFOL N/A 02/02/2015   Procedure: ESOPHAGOGASTRODUODENOSCOPY (EGD) WITH PROPOFOL;  Surgeon: Judeth Horn, MD;  Location: [facility];  Service: General;  Laterality: N/A;  . IR GENERIC HISTORICAL  08/26/2015   IR GASTRIC TUBE PERC CHG W/O IMG GUIDE 08/26/2015 Aletta Edouard, MD [facility]  . IR GENERIC HISTORICAL  09/14/2015   IR REPLC GASTRO/COLONIC TUBE PERCUT W/FLUORO 09/14/2015 Darrell K Allred, PA-C [facility]  . IR REPLACE G-TUBE SIMPLE WO FLUORO  06/08/2016  . IR REPLACE G-TUBE SIMPLE WO  FLUORO  10/24/2016  . IR REPLACE G-TUBE SIMPLE WO FLUORO  01/07/2017  . PEG PLACEMENT N/A 02/02/2015   Procedure: PERCUTANEOUS ENDOSCOPIC GASTROSTOMY (PEG) PLACEMENT;  Surgeon: Judeth Horn, MD;  Location: [facility];  Service: General;  Laterality: N/A;  . RADIOLOGY WITH ANESTHESIA N/A 01/13/2015   Procedure: RADIOLOGY WITH ANESTHESIA;  Surgeon: Consuella Lose, MD;  Location: [facility];  Service: Radiology;  Laterality: N/A;  . TRACHEOSTOMY      Social History   Socioeconomic History  . Marital status: Single    Spouse name: Not on file  . Number of children: Not on file  . Years of education: Not on file  . Highest education level: Not on file  Occupational History  . Not on file  Social Needs  . Financial resource strain: Not on file  . Food insecurity:    Worry: Not on file    Inability: Not on file  . Transportation needs:    Medical: Not on file    Non-medical: Not on file  Tobacco Use  . Smoking status: Former Research scientist (life sciences)  . Smokeless tobacco: Never Used  Substance and Sexual Activity  . Alcohol use: No  . Drug use: No  . Sexual activity: Not on file  Lifestyle  . Physical activity:    Days per week: Not on file    Minutes per session: Not on file  . Stress: Not on file  Relationships  . Social connections:  Talks on phone: Not on file    Gets together: Not on file    Attends religious service: Not on file    Active member of club or organization: Not on file    Attends meetings of clubs or organizations: Not on file    Relationship status: Not on file  . Intimate partner violence:    Fear of current or ex partner: Not on file    Emotionally abused: Not on file    Physically abused: Not on file    Forced sexual activity: Not on file  Other Topics Concern  . Not on file  Social History Narrative  . Not on file   Family History  Problem Relation Age of Onset  . Hypertension Other       VITAL SIGNS BP 130/70   Pulse 75   Temp 97.9 F (36.6 C)   Resp  20   Ht 5' 5"  (1.651 m)   Wt 115 lb 3.2 oz (52.3 kg)   LMP  (LMP Unknown)   SpO2 98%   BMI 19.17 kg/m   Outpatient Encounter Medications as of 10/18/2017  Medication Sig  . acetaminophen (TYLENOL) 325 MG tablet Give 2 tablets via G-tube every 6 hours for pain routinely and every 4 hours as needed  . Amino Acids-Protein Hydrolys (FEEDING SUPPLEMENT, PRO-STAT SUGAR FREE 64,) LIQD Place 30 mLs into feeding tube 2 (two) times daily.   . bethanechol (URECHOLINE) 10 MG tablet Place 10 mg into feeding tube 3 (three) times daily.   Marland Kitchen ENOXAPARIN SODIUM [place] Inject 50 mg into the skin every 12 (twelve) hours.   . gabapentin (NEURONTIN) 100 MG capsule Give 200 mg Per G-Tube two times daily HOLD IF LETHARGIC  . levETIRAcetam (KEPPRA) 100 MG/ML solution Place 5 mLs (500 mg total) into feeding tube 2 (two) times daily.  . metoprolol tartrate (LOPRESSOR) 25 MG tablet Place 25 mg into feeding tube 2 (two) times daily. Hold if SBP < 100 apical pulse  . Multiple Vitamin (MULTIVITAMIN) tablet Place 1 tablet into feeding tube daily.   . Nutritional Supplements (FEEDING SUPPLEMENT, JEVITY 1.5 CAL,) LIQD Administer Jevity 1.5 @ 200 ml every 4 hours daily  Flush tube with 30 ml water prior and post administration  . Nutritional Supplements (NUTRITIONAL SUPPLEMENT PO) Take by mouth. NPO - Nothing by mouth Diet -  Nothing by mouth texture  . polyethylene glycol (MIRALAX / GLYCOLAX) packet Place 17 g into feeding tube daily.   . Probiotic Product (PROBIOTIC DAILY PO) Place 1 capsule into feeding tube 2 (two) times daily.  . ranitidine (ZANTAC) 150 MG tablet Place 150 mg into feeding tube daily.   . rosuvastatin (CRESTOR) 5 MG tablet Place 5 mg into feeding tube daily.   . sertraline (ZOLOFT) 100 MG tablet Place 100 mg into feeding tube daily.   . traZODone (DESYREL) 50 MG tablet Place 50 mg into feeding tube at bedtime.  . Water For Irrigation, Sterile (STERILE WATER FOR IRRIGATION) 200 cc water flushes via g-tube  every 4 hours   No facility-administered encounter medications on file as of 10/18/2017.      SIGNIFICANT DIAGNOSTIC EXAMS   PREVIOUS  05-06-17: ct angio of chest: 1. No pulmonary embolus identified. 2. Diffuse peribronchial thickening with debris in the mainstem bronchi and extending into the lower lobes. Findings may represent acute bronchitis or aspiration given the distribution. No consolidation. 3. Mildly patulous esophagus. 4. Aorta and coronary artery calcific atherosclerosis. 05-08-17: 2-d echo: - Left ventricle: The  cavity size was normal. There was mild concentric hypertrophy. Systolic function was vigorous. The estimated ejection fraction was in the range of 65% to 70%. Wall motion was normal; there were no regional wall motion abnormalities. Doppler parameters are consistent with abnormal left ventricular relaxation (grade 1 diastolic dysfunction). There was no evidence of elevated ventricular filling pressure by  Doppler parameters. - Aortic valve: There was no regurgitation. - Aortic root: The aortic root was normal in size. - Mitral valve: There was no regurgitation. - Left atrium: The atrium was normal in size. - Right ventricle: Systolic function was normal. - Right atrium: The atrium was normal in size. - Pulmonic valve: There was no regurgitation. - Inferior vena cava: The vessel was normal in size. - Pericardium, extracardiac: A mild anteriorly located pericardial effusion was identified. Features were not consistent with  tamponade physiology.  05-09-17: right foot x-ray: 1. No radiographic findings of osteomyelitis. 2. Osteoporosis.  05-12-17: MRI right knee: 1. Incomplete study as only three axial sequences were obtained. The patient refused further imaging due to pain. 2. No joint effusion, excluding septic arthritis. 3. 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 No evidence of osteomyelitis.  05-13-17: MRI right ankle: 1. No osteomyelitis of right ankle. 2. No MRI  evidence of septic arthritis of the right ankle. 3. Soft tissue ulcer overlying the posterior calcaneus.  06-17-17: mammogram: no evidence of malignancy  09-06-17: kub:  Contrast injected into the G-tube is noted filling the antrum of the stomach and proximal duodenum as expected.  09-06-17: chest x-ray: No acute cardiopulmonary process seen.  09-06-17: ct of abdomen and pelvis:  1. Gastrostomy tube in place in the stomach. Soft tissue strandingand small volume fluid/hematoma around the tube in the abdominalwall. No evidence of abscess. 2. No other evidence of acute abnormality in the abdomen or pelvis. 3.  Aortic Atherosclerosis   NO NEW EXAMS    LABS REVIEWED: PREVIOUS   10-11-16: glucose 133; bun 25.6; creat 0.63; k+ 4.8; na++ 140; ca 9.9; liver normal albumin 4.3 10-22-16: glucose 131; bun 29.0; creat 0.62; k+ 4.7; na++ 141; ca 9.8; liver normal albumin 4.3 01-06-17: wb 8.1; hgb 15.0; hct 45.0; mcv  93.2; plt 199; glucose 99; bun 20; creat 0.74; k+ 4.0; na++ 142; ca 10.7  01-25-17: hgb a1c 6.1; chol 187; ldl 117; trig 184; hdl 31  02-14-17: wbc 28.0; hgb 13.9; hct 41.0; mcv 90.5 ;plt 297  glucose 165; bun 34.4; creat 1.04; k+ 4.1; NA++ 156; ALT 38 AST 22; alk phos  133; albumin 4.2 blood culture: no growth urine culture no growth 02-14-17 (ED):  Wbc 33.7; hgb 13.4; hct 42.8; mcv 99.5; plt 279 glucose 210; bun 41; creat 1.37; k+ 3.6; na++ 155; ca 9.9; liver normal albumin 3.4  02-15-17: blood culture: no growth; flu: neg HIV: nr; hgb a1c 6.1 02-17-17: wbc 18.4; hgb 9.4; hct 31.6; mcv 97.8; plt 214; glucose 100; bun 23; creat 0.75; k+ 3.2; na++150 liver normal albumin 2.4 mag 2.3 phos 3.0  02-22-17: wbc 13.3; hgb 10.5; hct 33.6; mcv 94.6; plt 424; glucose 102; bun 16; creat 0.58; k+ 4.0; na++ 138; ca 9.9 02-24-17: wbc  11.2; hgb 11.2; hct 35.2; mcv 95.7; plt 387; glucose 115; bun 11; creat 0.57; k+ 4.2; na++ 138; ca 9.4 05-06-17: wbc 16.7; hgb 14.5; hct  47.9; mcv 96.8; plt 306; glucose 251; bun 68;  creat 2.68; k+ 3.5; na++ 154; ca 9.1; blood culture: MRSA: urine culture: no growth 05-07-17: glucose 109; bun 60; creat  2.10; k+ 4.7; na++ 158; ca 7.4 ;liver normal albumin 2.3 05-09-17: wbc 10.0; hgb 11.8; hct 37.7; mcv 92.9; plt 210; glucose 121; bun 7; creat 0.77; k+ 3.0; na++ 142; ca 9.0; blood culture: no growth 05-10-17: wbc 8.5; hgb 11.0; hct 35.1; mcv 92.4; plt 214; glucose 120; bun 6; creat 0.87; k+ 3.2; na++ 143; ca 9.2; hepatitis: neg 05-12-17: wbc 8.9; hgb 11.6; hct 36.0; mcv 91.4; plt 231; glucose 113; bun 8; creat 0.70; k+ 3.6; na++ 141; ca 9.3 05-29-17: chol 80; ldl 31; trig 108; hdl 21; hgb a1c 6.1   06-03-17: wbc 8,8l hgb 12.1; hct 38; plt 209; glucose 119 bun 18; creat 0.6; k+ 5.2; na++147 chol 112; ldl 58; trig 112; hdl 29  08-08-17: wbc 9.0; hgb 12.4; hct 39.7; mcv 91.9; plt 290; glucose 105; bun 11; creat 0.83;  k+ 3.7; na++ 142 ca 10.4; liver normal albumin 3.8  09-06-17: wbc 1.1; hgb 12.2; hct 37.9; mcv 90.5; plt 273 glucose 101; bun 17; creat 0.94; k+ 4.2; na++ 141; liver normal albumin 4.0    urine culture no growth blood culture no growth 09-08-17: wbc  6.6; hgb 11.4; hct 36.5; mcv 93.4; plt 219; glucose 104; bun 14; creat 0.59; k+ 4.1; na++ 141; ca 9.4   NO NEW LABS.    Review of Systems  Reason unable to perform ROS: expressive aphasia     Physical Exam  Constitutional: She appears well-developed and well-nourished. No distress.  Frail   Neck: Carotid bruit is present. No thyromegaly present.  Bilateral   Cardiovascular: Normal rate, regular rhythm, normal heart sounds and intact distal pulses.  Pulmonary/Chest: Effort normal and breath sounds normal. No respiratory distress.  Abdominal: Soft. Bowel sounds are normal. She exhibits no distension. There is no tenderness.  Peg tube present without signs of infection present.   Musculoskeletal: She exhibits no edema.  Left hemiplegia   Lymphadenopathy:    She has no cervical adenopathy.  Neurological: She is alert.  Skin:  Skin is warm and dry. She is not diaphoretic.  Psychiatric: She has a normal mood and affect.     ASSESSMENT/ PLAN:  TODAY  1. Seizures: is stable  no reports of seizure activity present: will continue keppra 500 mg twice daily and will monitor   2. DVT (chronic left lower extremity) and chronic pulmonary embolism: is stable  she does require long term anticoagulation therapy; will continue lovenox 50  mg twice daily (weigt based); is not a candidate for xarelto; or eliquis due to her history of GI bleed; she is not appropriate for coumadin therapy; unable  to obtain adequate INR.  3. Weight loss: is slightly improved ; her current weight is 115 pounds.  Will continue prostat to 30 cc twice daily to help with nutritional support   4. Dyslipidemia associated with type 2 diabetes mellitus: stable ldl 58  Will continue crestor  5 mg daily due to her weight loss.    PREVIOUS   5. gerd without esophagitis: stable will continue zantac 150 mg daily   6. Slow transit constipation: is status post SBO: will continue miralax 17 gm daily   7. Insomnia: is stable will continue trazodone 50 mg nightly   8. Urine retention: stable will continue urecholine 10 mg three times daily  ; will not make changes will not make changes.   9. Type 2 diabetes mellitus with neurological manifestations: is stable  hgb a1c is 6.1 (previous 5.4) ; is currently off medications; will continue to  monitor her status.  ldl is 85 is on statin.   10. Essential benign hypertension with supraventricular tachycardia:  stable: b/p 113/81  pulse: 79: will continue lopressor 25 mg twice daily   11.  Dysphagia paraesophageal  phase : stable  no signs of aspiration present she is npo; she is dependent upon peg tube feeding. She is doing well with the bolus feedings.   12. 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  Will continue  lovenox 50 mg twice daily       MD is aware of resident's narcotic use and is in agreement with current plan of care. We will attempt to wean resident as apropriate   Ok Edwards NP [facility]  Contact [phone] Monday through Friday 8am- 5pm  After hours call [phone]

## 2017-11-19 ENCOUNTER — Encounter: Payer: Self-pay | Admitting: Adult Health

## 2017-11-19 ENCOUNTER — Non-Acute Institutional Stay (SKILLED_NURSING_FACILITY): Payer: Medicaid Other | Admitting: Adult Health

## 2017-11-19 DIAGNOSIS — K219 Gastro-esophageal reflux disease without esophagitis: Secondary | ICD-10-CM | POA: Diagnosis not present

## 2017-11-19 DIAGNOSIS — R634 Abnormal weight loss: Secondary | ICD-10-CM

## 2017-11-19 DIAGNOSIS — K5901 Slow transit constipation: Secondary | ICD-10-CM

## 2017-11-19 DIAGNOSIS — E1149 Type 2 diabetes mellitus with other diabetic neurological complication: Secondary | ICD-10-CM | POA: Diagnosis not present

## 2017-11-19 NOTE — Progress Notes (Signed)
Location:   [facility] Room Number: [id number] Place of Service:  SNF (31)   CODE STATUS: Full Code  No Known Allergies  Chief Complaint  Patient presents with  . Medical Management of Chronic Issues    gerd without esophagitis; type II diabetes mellitus with neurological manifestations; slow transit constipation; weight loss, non intentional.     HPI:  She is a 52 year old long term resident of this facility being seen for the management of her chronic illnesses: gerd; diabetes; constipation; weight loss. She is unable to participate in the hpi or ros. She is losing weight. From 128 pounds in August to her current weight of 117 pounds. She has rotten teeth; which need to be pulled; this is pending. This is contributing to her weight loss. There are no reports of uncontrolled pain. She is having around 5 lose stools per day and is taking miralax daily.   Past Medical History:  Diagnosis Date  . Acute pulmonary embolism ([place]) 02/19/2015  . Acute respiratory failure ([place])   . Diabetes mellitus without complication (HCC)    Type 2, W/o complications  . DVT (deep venous thrombosis) ([place]) 04/05/2015  . Dysphagia   . Epilepsy ([place])   . GERD (gastroesophageal reflux disease)   . Hyperlipidemia   . Hypertension   . IBS (irritable bowel syndrome)   . Nontraumatic subarachnoid hemorrhage ([place])   . SAH (subarachnoid hemorrhage) ([place])   . Urinary retention     Past Surgical History:  Procedure Laterality Date  . ABDOMINAL SURGERY    . ANEURYSM COILING    . COLONOSCOPY N/A 02/20/2015   Procedure: COLONOSCOPY;  Surgeon: Gatha Mayer, MD;  Location: [place];  Service: Endoscopy;  Laterality: N/A;  . ESOPHAGOGASTRODUODENOSCOPY (EGD) WITH PROPOFOL N/A 02/02/2015   Procedure: ESOPHAGOGASTRODUODENOSCOPY (EGD) WITH PROPOFOL;  Surgeon: Judeth Horn, MD;  Location: [place];  Service: General;  Laterality: N/A;  . IR GENERIC HISTORICAL  08/26/2015   IR GASTRIC TUBE PERC CHG W/O  IMG GUIDE 08/26/2015 Aletta Edouard, MD [facility]-INTERV RAD  . IR GENERIC HISTORICAL  09/14/2015   IR REPLC GASTRO/COLONIC TUBE PERCUT W/FLUORO 09/14/2015 Darrell K Allred, PA-C [facility]-INTERV RAD  . IR REPLACE G-TUBE SIMPLE WO FLUORO  06/08/2016  . IR REPLACE G-TUBE SIMPLE WO FLUORO  10/24/2016  . IR REPLACE G-TUBE SIMPLE WO FLUORO  01/07/2017  . PEG PLACEMENT N/A 02/02/2015   Procedure: PERCUTANEOUS ENDOSCOPIC GASTROSTOMY (PEG) PLACEMENT;  Surgeon: Judeth Horn, MD;  Location: [place];  Service: General;  Laterality: N/A;  . RADIOLOGY WITH ANESTHESIA N/A 01/13/2015   Procedure: RADIOLOGY WITH ANESTHESIA;  Surgeon: Consuella Lose, MD;  Location: [place];  Service: Radiology;  Laterality: N/A;  . TRACHEOSTOMY      Social History   Socioeconomic History  . Marital status: Single    Spouse name: Not on file  . Number of children: Not on file  . Years of education: Not on file  . Highest education level: Not on file  Occupational History  . Not on file  Social Needs  . Financial resource strain: Not on file  . Food insecurity:    Worry: Not on file    Inability: Not on file  . Transportation needs:    Medical: Not on file    Non-medical: Not on file  Tobacco Use  . Smoking status: Former Research scientist (life sciences)  . Smokeless tobacco: Never Used  Substance and Sexual Activity  . Alcohol use: No  . Drug use: No  .  Sexual activity: Not on file  Lifestyle  . Physical activity:    Days per week: Not on file    Minutes per session: Not on file  . Stress: Not on file  Relationships  . Social connections:    Talks on phone: Not on file    Gets together: Not on file    Attends religious service: Not on file    Active member of club or organization: Not on file    Attends meetings of clubs or organizations: Not on file    Relationship status: Not on file  . Intimate partner violence:    Fear of current or ex partner: Not on file    Emotionally abused: Not on file    Physically abused: Not on file     Forced sexual activity: Not on file  Other Topics Concern  . Not on file  Social History Narrative  . Not on file   Family History  Problem Relation Age of Onset  . Hypertension Other       VITAL SIGNS BP 118/76   Pulse 76   Temp 97.6 F (36.4 C)   Resp 18   Ht 5' 5"  (1.651 m)   Wt 117 lb (53.1 kg)   LMP  (LMP Unknown)   SpO2 98%   BMI 19.47 kg/m   Outpatient Encounter Medications as of 11/19/2017  Medication Sig  . acetaminophen (TYLENOL) 325 MG tablet Give 2 tablets via G-tube every 6 hours for pain routinely and every 4 hours as needed  . bethanechol (URECHOLINE) 10 MG tablet Place 10 mg into feeding tube 3 (three) times daily.   Marland Kitchen ENOXAPARIN SODIUM [place] Inject 50 mg into the skin every 12 (twelve) hours.   . gabapentin (NEURONTIN) 100 MG capsule Give 200 mg Per G-Tube two times daily HOLD IF LETHARGIC  . levETIRAcetam (KEPPRA) 100 MG/ML solution Place 5 mLs (500 mg total) into feeding tube 2 (two) times daily.  . metoprolol tartrate (LOPRESSOR) 25 MG tablet Place 25 mg into feeding tube 2 (two) times daily. Hold if SBP < 100 apical pulse  . Nutritional Supplements (FEEDING SUPPLEMENT, JEVITY 1.5 CAL,) LIQD Administer Jevity 1.5 @ 200 ml every 4 hours daily  Flush tube with 30 ml water prior and post administration  . Nutritional Supplements (NUTRITIONAL SUPPLEMENT PO) Take by mouth. NPO - Nothing by mouth Diet -  Nothing by mouth texture  . Nutritional Supplements (PROMOD) LIQD Take 30 mLs by mouth daily.  . polyethylene glycol (MIRALAX / GLYCOLAX) packet Place 17 g into feeding tube daily.   . Probiotic Product (PROBIOTIC DAILY PO) Place 1 capsule into feeding tube 2 (two) times daily.  . ranitidine (ZANTAC) 150 MG tablet Place 150 mg into feeding tube daily.   . rosuvastatin (CRESTOR) 5 MG tablet Place 5 mg into feeding tube daily.   . sertraline (ZOLOFT) 100 MG tablet Place 100 mg into feeding tube daily.   . traZODone (DESYREL) 50 MG tablet Place 50 mg into feeding  tube at bedtime.  . Water For Irrigation, Sterile (STERILE WATER FOR IRRIGATION) 200 cc water flushes via g-tube every 4 hours  . [DISCONTINUED] Amino Acids-Protein Hydrolys (FEEDING SUPPLEMENT, PRO-STAT SUGAR FREE 64,) LIQD Place 30 mLs into feeding tube 2 (two) times daily.   . [DISCONTINUED] Multiple Vitamin (MULTIVITAMIN) tablet Place 1 tablet into feeding tube daily.    No facility-administered encounter medications on file as of 11/19/2017.      SIGNIFICANT DIAGNOSTIC EXAMS  PREVIOUS  05-06-17:  ct angio of chest: 1. No pulmonary embolus identified. 2. Diffuse peribronchial thickening with debris in the mainstem bronchi and extending into the lower lobes. Findings may represent acute bronchitis or aspiration given the distribution. No consolidation. 3. Mildly patulous esophagus. 4. Aorta and coronary artery calcific atherosclerosis. 05-08-17: 2-d echo: - Left ventricle: The cavity size was normal. There was mild concentric hypertrophy. Systolic function was vigorous. The estimated ejection fraction was in the range of 65% to 70%. Wall motion was normal; there were no regional wall motion abnormalities. Doppler parameters are consistent with abnormal left ventricular relaxation (grade 1 diastolic dysfunction). There was no evidence of elevated ventricular filling pressure by  Doppler parameters. - Aortic valve: There was no regurgitation. - Aortic root: The aortic root was normal in size. - Mitral valve: There was no regurgitation. - Left atrium: The atrium was normal in size. - Right ventricle: Systolic function was normal. - Right atrium: The atrium was normal in size. - Pulmonic valve: There was no regurgitation. - Inferior vena cava: The vessel was normal in size. - Pericardium, extracardiac: A mild anteriorly located pericardial effusion was identified. Features were not consistent with  tamponade physiology.  05-09-17: right foot x-ray: 1. No radiographic findings of  osteomyelitis. 2. Osteoporosis.  05-12-17: MRI right knee: 1. Incomplete study as only three axial sequences were obtained. The patient refused further imaging due to pain. 2. No joint effusion, excluding septic arthritis. 3. 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 No evidence of osteomyelitis.  05-13-17: MRI right ankle: 1. No osteomyelitis of right ankle. 2. No MRI evidence of septic arthritis of the right ankle. 3. Soft tissue ulcer overlying the posterior calcaneus.  06-17-17: mammogram: no evidence of malignancy  09-06-17: kub:  Contrast injected into the G-tube is noted filling the antrum of the stomach and proximal duodenum as expected.  09-06-17: chest x-ray: No acute cardiopulmonary process seen.  09-06-17: ct of abdomen and pelvis:  1. Gastrostomy tube in place in the stomach. Soft tissue strandingand small volume fluid/hematoma around the tube in the abdominalwall. No evidence of abscess. 2. No other evidence of acute abnormality in the abdomen or pelvis. 3.  Aortic Atherosclerosis   NO NEW EXAMS    LABS REVIEWED: PREVIOUS   01-06-17: wb 8.1; hgb 15.0; hct 45.0; mcv  93.2; plt 199; glucose 99; bun 20; creat 0.74; k+ 4.0; na++ 142; ca 10.7  01-25-17: hgb a1c 6.1; chol 187; ldl 117; trig 184; hdl 31  02-14-17: wbc 28.0; hgb 13.9; hct 41.0; mcv 90.5 ;plt 297  glucose 165; bun 34.4; creat 1.04; k+ 4.1; NA++ 156; ALT 38 AST 22; alk phos  133; albumin 4.2 blood culture: no growth urine culture no growth 02-14-17 (ED):  Wbc 33.7; hgb 13.4; hct 42.8; mcv 99.5; plt 279 glucose 210; bun 41; creat 1.37; k+ 3.6; na++ 155; ca 9.9; liver normal albumin 3.4  02-15-17: blood culture: no growth; flu: neg HIV: nr; hgb a1c 6.1 02-17-17: wbc 18.4; hgb 9.4; hct 31.6; mcv 97.8; plt 214; glucose 100; bun 23; creat 0.75; k+ 3.2; na++150 liver normal albumin 2.4 mag 2.3 phos 3.0  02-22-17: wbc 13.3; hgb 10.5; hct 33.6; mcv 94.6; plt 424; glucose 102; bun 16; creat 0.58; k+ 4.0; na++ 138; ca 9.9 02-24-17: wbc   11.2; hgb 11.2; hct 35.2; mcv 95.7; plt 387; glucose 115; bun 11; creat 0.57; k+ 4.2; na++ 138; ca 9.4 05-06-17: wbc 16.7; hgb 14.5; hct  47.9; mcv 96.8; plt 306; glucose 251; bun 68;  creat 2.68; k+ 3.5; na++ 154; ca 9.1; blood culture: MRSA: urine culture: no growth 05-07-17: glucose 109; bun 60; creat 2.10; k+ 4.7; na++ 158; ca 7.4 ;liver normal albumin 2.3 05-09-17: wbc 10.0; hgb 11.8; hct 37.7; mcv 92.9; plt 210; glucose 121; bun 7; creat 0.77; k+ 3.0; na++ 142; ca 9.0; blood culture: no growth 05-10-17: wbc 8.5; hgb 11.0; hct 35.1; mcv 92.4; plt 214; glucose 120; bun 6; creat 0.87; k+ 3.2; na++ 143; ca 9.2; hepatitis: neg 05-12-17: wbc 8.9; hgb 11.6; hct 36.0; mcv 91.4; plt 231; glucose 113; bun 8; creat 0.70; k+ 3.6; na++ 141; ca 9.3 05-29-17: chol 80; ldl 31; trig 108; hdl 21; hgb a1c 6.1   06-03-17: wbc 8,8l hgb 12.1; hct 38; plt 209; glucose 119 bun 18; creat 0.6; k+ 5.2; na++147 chol 112; ldl 58; trig 112; hdl 29  08-08-17: wbc 9.0; hgb 12.4; hct 39.7; mcv 91.9; plt 290; glucose 105; bun 11; creat 0.83;  k+ 3.7; na++ 142 ca 10.4; liver normal albumin 3.8  09-06-17: wbc 1.1; hgb 12.2; hct 37.9; mcv 90.5; plt 273 glucose 101; bun 17; creat 0.94; k+ 4.2; na++ 141; liver normal albumin 4.0    urine culture no growth blood culture no growth 09-08-17: wbc  6.6; hgb 11.4; hct 36.5; mcv 93.4; plt 219; glucose 104; bun 14; creat 0.59; k+ 4.1; na++ 141; ca 9.4   NO NEW LABS.    Review of Systems  Unable to perform ROS: Other (expressive aphasia )    Physical Exam  Constitutional: She appears well-developed and well-nourished. No distress.  Frail   Neck: Carotid bruit is present. No thyromegaly present.  Bilateral   Cardiovascular: Normal rate, regular rhythm, normal heart sounds and intact distal pulses.  Pulmonary/Chest: Effort normal and breath sounds normal. No respiratory distress.  Abdominal: Soft. Bowel sounds are normal. She exhibits no distension. There is no tenderness.  Peg tube present without  signs of infection present.   Musculoskeletal: She exhibits no edema.  Left hemiplegia   Lymphadenopathy:    She has no cervical adenopathy.  Neurological: She is alert.  Skin: Skin is warm and dry. She is not diaphoretic.  Psychiatric: She has a normal mood and affect.     ASSESSMENT/ PLAN:  TODAY  1. Weight loss:her current weight is 117 pounds. Is worse: August 2019: 128 pounds; 11-06-17: 199 pounds: 11-19-17: 117 pounds.  Her albumin is normal will stop prostat;  She has rotten teeth; is awaiting removal; this should help with her weight and overall health. Will place her on daily weights to closely monitor her status.   2. Type 2 diabetes mellitus with neurological manifestations: is stable  hgb a1c is 6.1 (previous 5.4) ; is currently off medications; will continue to monitor her status.  ldl is 85 is on statin.  3. gerd without esophagitis: stable will continue zantac 150 mg daily   4. Slow transit constipation: is status post SBO: is having about 5 stools daily will stop miralax and will monitor her status.   PREVIOUS   5. Insomnia: is stable will continue trazodone 50 mg nightly   6. Urine retention: stable will continue urecholine 10 mg three times daily  ; will not make changes will not make changes.    7. Essential benign hypertension with supraventricular tachycardia:  stable: b/p 118/76  pulse: 76: will continue lopressor 25 mg twice daily   8.  Dysphagia paraesophageal  phase : stable  no signs of aspiration present she is  npo; she is dependent upon peg tube feeding. She is doing well with the bolus feedings.   9. 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  Will continue lovenox 50 mg twice daily   10. Seizures: is stable  no reports of seizure activity present: will continue keppra 500 mg twice daily and will monitor   11. DVT (chronic left lower extremity) and  chronic pulmonary embolism: is stable  she does require long term anticoagulation therapy; will continue lovenox 50  mg twice daily (weigt based); is not a candidate for xarelto; or eliquis due to her history of GI bleed; she is not appropriate for coumadin therapy; unable  to obtain adequate INR.  12. Dyslipidemia associated with type 2 diabetes mellitus: stable ldl 58  Will continue crestor  5 mg daily due to her weight los s.       MD is aware of resident's narcotic use and is in agreement with current plan of care. We will attempt to wean resident as apropriate   Ok Edwards NP [facility]  Contact [phone] Monday through Friday 8am- 5pm  After hours call [phone]

## 2017-11-20 ENCOUNTER — Encounter: Payer: Self-pay | Admitting: Adult Health

## 2018-04-25 ENCOUNTER — Other Ambulatory Visit: Payer: Self-pay

## 2018-04-25 ENCOUNTER — Emergency Department (HOSPITAL_COMMUNITY)
Admission: EM | Admit: 2018-04-25 | Discharge: 2018-04-25 | Disposition: A | Discharge status: Skilled Nursing Facility | Payer: Medicaid Other | Attending: Emergency Medicine | Admitting: Emergency Medicine

## 2018-04-25 ENCOUNTER — Emergency Department (HOSPITAL_COMMUNITY): Payer: Medicaid Other

## 2018-04-25 ENCOUNTER — Encounter (HOSPITAL_COMMUNITY): Payer: Self-pay | Admitting: Emergency Medicine

## 2018-04-25 DIAGNOSIS — E119 Type 2 diabetes mellitus without complications: Secondary | ICD-10-CM | POA: Diagnosis not present

## 2018-04-25 DIAGNOSIS — Z79899 Other long term (current) drug therapy: Secondary | ICD-10-CM | POA: Insufficient documentation

## 2018-04-25 DIAGNOSIS — Z431 Encounter for attention to gastrostomy: Secondary | ICD-10-CM | POA: Diagnosis not present

## 2018-04-25 DIAGNOSIS — I10 Essential (primary) hypertension: Secondary | ICD-10-CM | POA: Insufficient documentation

## 2018-04-25 DIAGNOSIS — R1310 Dysphagia, unspecified: Secondary | ICD-10-CM | POA: Diagnosis present

## 2018-04-25 DIAGNOSIS — Z87891 Personal history of nicotine dependence: Secondary | ICD-10-CM | POA: Insufficient documentation

## 2018-04-25 MED ORDER — IOHEXOL 300 MG/ML  SOLN
30.0000 mL | Freq: Once | INTRAMUSCULAR | Status: AC | PRN
  Administered 2018-04-25: 30 mL via ORAL

## 2018-04-25 NOTE — Discharge Instructions (Addendum)
A 16Fr PEG tube have been replaced.  Follow up with your doctor for further care.

## 2018-04-25 NOTE — ED Triage Notes (Signed)
Per EMS, patient from [facility], staff reports PEG tube came out during routine cleaning at approximately 1700. Hx stroke.

## 2018-04-25 NOTE — ED Notes (Signed)
Patient transported to X-ray 

## 2018-04-25 NOTE — ED Provider Notes (Signed)
[facility]-EMERGENCY DEPT Provider Note   CSN: [id number] Arrival date & time: 04/25/18  1831    History   Chief Complaint No chief complaint on file.   HPI Rachel Vang is a 53 y.o. female.     The history is provided by the EMS personnel, the patient and medical records. No language interpreter was used.     53 year old female with history of subarachnoid hemorrhage with resultant dysphagia resulting in PEG tube, GERD, hypertension, diabetes, presenting to the ED due to her PEG tube came out.  Per EMS, patient lives at [facility].  The staff report that her PEG tube came out during routine cleaning at approximately 1700.  At this time patient does not complain of any significant pain.  She does have an established history of PEG tube, with her last size 16 [place].  Past Medical History:  Diagnosis Date  . Acute pulmonary embolism (HCC) 02/19/2015  . Acute respiratory failure (HCC)   . Diabetes mellitus without complication (HCC)    Type 2, W/o complications  . DVT (deep venous thrombosis) (HCC) 04/05/2015  . Dysphagia   . Epilepsy (HCC)   . GERD (gastroesophageal reflux disease)   . Hyperlipidemia   . Hypertension   . IBS (irritable bowel syndrome)   . Nontraumatic subarachnoid hemorrhage (HCC)   . SAH (subarachnoid hemorrhage) (HCC)   . Urinary retention     Patient Active Problem List   Diagnosis Date Noted  . Hemiplegia as late effect of cerebrovascular accident (CVA) (HCC) 09/19/2017  . Fever 09/06/2017  . Slow transit constipation 06/08/2017  . Insomnia 06/08/2017  . Generalized weakness 06/07/2017  . Aspiration pneumonia of both lower lobes due to gastric secretions (HCC)   . Demand ischemia (HCC)   . Chronic pain of right knee   . Chronic heel pain, right   . Small bowel obstruction (HCC)   . SVT (supraventricular tachycardia) (HCC)   . AKI (acute kidney injury) (HCC) 05/06/2017  . Hypernatremia 05/06/2017  . Dyslipidemia  associated with type 2 diabetes mellitus (HCC) 04/27/2017  . Gingivitis, acute, plaque induced 03/26/2017  . Hypersecretion of saliva 03/14/2017  . Bruxism (teeth grinding) 03/14/2017  . Lobar pneumonia (HCC)   . Acute on chronic respiratory failure with hypoxia (HCC)   . Pressure injury of skin 02/16/2017  . Foot drop, right foot 10/18/2016  . CVA (cerebrovascular accident) (HCC) 08/30/2016  . GERD without esophagitis 08/30/2016  . Chronic pulmonary embolism (HCC) 07/03/2016  . Weight loss, non-intentional 06/13/2016  . Type II diabetes mellitus with neurological manifestations (HCC) 05/20/2015  . Essential hypertension, benign 05/20/2015  . Status post insertion of percutaneous endoscopic gastrostomy (PEG) tube (HCC) 05/20/2015  . DVT (deep venous thrombosis) (HCC) 04/05/2015  . Protein-calorie malnutrition, severe (HCC) 03/25/2015  . Dysphagia 02/18/2015  . Seizures (HCC) 02/18/2015  . Elevated troponin 02/18/2015  . Urine retention 02/18/2015  . History of ETT   . Subarachnoid hemorrhage [facility])     Past Surgical History:  Procedure Laterality Date  . ABDOMINAL SURGERY    . ANEURYSM COILING    . COLONOSCOPY N/A 02/20/2015   Procedure: COLONOSCOPY;  Surgeon: Iva Boop, MD;  Location[facility] ENDOSCOPY;  Service: Endoscopy;  Laterality: N/A;  . ESOPHAGOGASTRODUODENOSCOPY (EGD) WITH PROPOFOL N/A 02/02/2015   Procedure: ESOPHAGOGASTRODUODENOSCOPY (EGD) WITH PROPOFOL;  Surgeon: Jimmye Norman, MD;  Location: [facility] ENDOSCOPY;  Service: General;  Laterality: N/A;  . IR GENERIC HISTORICAL  08/26/2015   IR GASTRIC TUBE PERC CHG  W/O IMG GUIDE 08/26/2015 Irish Lack, MD [facility]-INTERV RAD  . IR GENERIC HISTORICAL  09/14/2015   IR REPLC GASTRO/COLONIC TUBE PERCUT W/FLUORO 09/14/2015 Darrell K Allred, PA-C [facility]-INTERV RAD  . IR REPLACE G-TUBE SIMPLE WO FLUORO  06/08/2016  . IR REPLACE G-TUBE SIMPLE WO FLUORO  10/24/2016  . IR REPLACE G-TUBE SIMPLE WO FLUORO  01/07/2017  . PEG PLACEMENT N/A 02/02/2015    Procedure: PERCUTANEOUS ENDOSCOPIC GASTROSTOMY (PEG) PLACEMENT;  Surgeon: Jimmye Norman, MD;  Location: [facility] ENDOSCOPY;  Service: General;  Laterality: N/A;  . RADIOLOGY WITH ANESTHESIA N/A 01/13/2015   Procedure: RADIOLOGY WITH ANESTHESIA;  Surgeon: Lisbeth Renshaw, MD;  Location: [facility] OR;  Service: Radiology;  Laterality: N/A;  . TRACHEOSTOMY       OB History   No obstetric history on file.      Home Medications    Prior to Admission medications   Medication Sig Start Date End Date Taking? Authorizing Provider  acetaminophen (TYLENOL) 325 MG tablet Give 2 tablets via G-tube every 6 hours for pain routinely and every 4 hours as needed    [provider]  bethanechol (URECHOLINE) 10 MG tablet Place 10 mg into feeding tube 3 (three) times daily.  09/08/17   [provider]  ENOXAPARIN SODIUM [place] Inject 50 mg into the skin every 12 (twelve) hours.  09/10/17   [provider]  gabapentin (NEURONTIN) 100 MG capsule Give 200 mg Per G-Tube two times daily HOLD IF LETHARGIC 09/08/17   Ellwood Sayers, MD  levETIRAcetam (KEPPRA) 100 MG/ML solution Place 5 mLs (500 mg total) into feeding tube 2 (two) times daily. 02/11/15   Leroy Sea, MD  metoprolol tartrate (LOPRESSOR) 25 MG tablet Place 25 mg into feeding tube 2 (two) times daily. Hold if SBP < 100 apical pulse    [provider]  Nutritional Supplements (FEEDING SUPPLEMENT, JEVITY 1.5 CAL,) LIQD Administer Jevity 1.5 @ 200 ml every 4 hours daily  Flush tube with 30 ml water prior and post administration    [provider]  Nutritional Supplements (NUTRITIONAL SUPPLEMENT PO) Take by mouth. NPO - Nothing by mouth Diet -  Nothing by mouth texture    [provider]  Nutritional Supplements (PROMOD) LIQD Take 30 mLs by mouth daily. 10/22/17   [provider]  polyethylene glycol (MIRALAX / GLYCOLAX) packet Place 17 g into feeding tube daily.     [provider]  Probiotic Product  (PROBIOTIC DAILY PO) Place 1 capsule into feeding tube 2 (two) times daily. 09/08/17   [provider]  ranitidine (ZANTAC) 150 MG tablet Place 150 mg into feeding tube daily.  04/25/17   [provider]  rosuvastatin (CRESTOR) 5 MG tablet Place 5 mg into feeding tube daily.  09/24/17   [provider]  sertraline (ZOLOFT) 100 MG tablet Place 100 mg into feeding tube daily.  07/12/17   [provider]  traZODone (DESYREL) 50 MG tablet Place 50 mg into feeding tube at bedtime.    [provider]  Water For Irrigation, Sterile (STERILE WATER FOR IRRIGATION) 200 cc water flushes via g-tube every 4 hours    [provider]    Family History Family History  Problem Relation Age of Onset  . Hypertension Other     Social History Social History   Tobacco Use  . Smoking status: Former Games developer  . Smokeless tobacco: Never Used  Substance Use Topics  . Alcohol use: No  . Drug use: No  Allergies   Patient has no known allergies.   Review of Systems Review of Systems  Constitutional: Negative for fever.  Gastrointestinal: Positive for abdominal pain.  Skin: Positive for wound.     Physical Exam Updated Vital Signs BP 113/69   Pulse (!) 48   Temp 98.5 F (36.9 C)   Resp 19   LMP  (LMP Unknown)   SpO2 98%   Physical Exam Vitals signs and nursing note reviewed.  Constitutional:      General: She is not in acute distress.    Appearance: She is well-developed.  HENT:     Head: Atraumatic.     Mouth/Throat:     Mouth: Mucous membranes are dry.  Eyes:     Conjunctiva/sclera: Conjunctivae normal.  Neck:     Musculoskeletal: Neck supple.  Abdominal:     Palpations: Abdomen is soft.     Tenderness: There is abdominal tenderness (PEG tube site to left abdominal wall with small amount of frank blood, mildly tender to palpation.).  Skin:    Findings: No rash.  Neurological:     Mental Status: She is alert.      ED  Treatments / Results  Labs (all labs ordered are listed, but only abnormal results are displayed) Labs Reviewed - No data to display  EKG None  Radiology Dg Abdomen Peg Tube Location  Result Date: 04/25/2018 CLINICAL DATA:  PEG adjustment/replacement/removal. EXAM: ABDOMEN - 1 VIEW COMPARISON:  09/11/2017 FINDINGS: Single supine view of the abdomen obtained after the installation of 30 cc Omnipaque the indwelling gastrostomy tube. Contrast opacifies the gastrostomy tubing, stomach, proximal duodenum. No evidence of extravasation or leak. Normal bowel gas pattern. IMPRESSION: Gastrostomy tube in the stomach without evidence of extravasation or leak. Electronically Signed   By: Narda Rutherford M.D.   On: 04/25/2018 20:41    Procedures Gastrostomy tube replacement Date/Time: 04/25/2018 7:53 PM Performed by: Fayrene Helper, PA-C Authorized by: Fayrene Helper, PA-C  Consent: Verbal consent obtained. Risks and benefits: risks, benefits and alternatives were discussed Consent given by: patient Patient understanding: patient states understanding of the procedure being performed Patient consent: the patient's understanding of the procedure matches consent given Procedure consent: procedure consent matches procedure scheduled Patient identity confirmed: verbally with patient and arm band Time out: Immediately prior to procedure a "time out" was called to verify the correct patient, procedure, equipment, support staff and site/side marked as required. Preparation: Patient was prepped and draped in the usual sterile fashion. Local anesthesia used: no  Anesthesia: Local anesthesia used: no  Sedation: Patient sedated: no  Patient tolerance: Patient tolerated the procedure well with no immediate complications Comments: Insertion of 16Fr PEG catheter without any complication.  Pt tolerates well.    (including critical care time)  Medications Ordered in ED Medications - No data to display    Initial Impression / Assessment and Plan / ED Course  I have reviewed the triage vital signs and the nursing notes.  Pertinent labs & imaging results that were available during my care of the patient were reviewed by me and considered in my medical decision making (see chart for details).        BP 113/69   Pulse (!) 48   Temp 98.5 F (36.9 C)   Resp 19   LMP  (LMP Unknown)   SpO2 98%    Final Clinical Impressions(s) / ED Diagnoses   Final diagnoses:  PEG (percutaneous endoscopic gastrostomy) adjustment/replacement/removal [facility])    ED Discharge Orders  None     6:54 PM Pt with established PEG tube site that came out during routine cleaning.  Will insert 16Fr PEG tube and will obtain KUB for assessment of placement.  Care discussed with DR. Allen.    Fayrene Helperran, Daejon Lich, PA-C 04/25/18 2047    Lorre NickAllen, Anthony, MD 04/26/18 [phone]

## 2018-04-25 NOTE — ED Notes (Signed)
[facility] called for transport.  

## 2018-04-25 NOTE — ED Notes (Signed)
Attempted to call [facility] to give report with no answer.

## 2018-08-06 ENCOUNTER — Non-Acute Institutional Stay: Payer: Medicaid Other | Admitting: Internal Medicine

## 2018-08-06 DIAGNOSIS — Z515 Encounter for palliative care: Secondary | ICD-10-CM

## 2018-08-06 DIAGNOSIS — R1314 Dysphagia, pharyngoesophageal phase: Secondary | ICD-10-CM

## 2018-08-08 ENCOUNTER — Other Ambulatory Visit: Payer: Self-pay

## 2019-03-16 ENCOUNTER — Emergency Department (HOSPITAL_COMMUNITY): Payer: Medicaid Other

## 2019-03-16 ENCOUNTER — Emergency Department (HOSPITAL_COMMUNITY)
Admission: EM | Admit: 2019-03-16 | Discharge: 2019-03-16 | Disposition: A | Discharge status: Home / Self Care | Payer: Medicaid Other | Attending: Emergency Medicine | Admitting: Emergency Medicine

## 2019-03-16 DIAGNOSIS — T85528A Displacement of other gastrointestinal prosthetic devices, implants and grafts, initial encounter: Secondary | ICD-10-CM | POA: Insufficient documentation

## 2019-03-16 DIAGNOSIS — Z431 Encounter for attention to gastrostomy: Secondary | ICD-10-CM

## 2019-03-16 DIAGNOSIS — Z79899 Other long term (current) drug therapy: Secondary | ICD-10-CM | POA: Insufficient documentation

## 2019-03-16 DIAGNOSIS — E119 Type 2 diabetes mellitus without complications: Secondary | ICD-10-CM | POA: Diagnosis not present

## 2019-03-16 DIAGNOSIS — Y731 Therapeutic (nonsurgical) and rehabilitative gastroenterology and urology devices associated with adverse incidents: Secondary | ICD-10-CM | POA: Diagnosis not present

## 2019-03-16 DIAGNOSIS — Z87891 Personal history of nicotine dependence: Secondary | ICD-10-CM | POA: Diagnosis not present

## 2019-03-16 DIAGNOSIS — I10 Essential (primary) hypertension: Secondary | ICD-10-CM | POA: Diagnosis not present

## 2019-03-16 NOTE — ED Notes (Signed)
ptar called 12:40

## 2019-03-16 NOTE — ED Notes (Signed)
16FR Peg-Tube requested from materials.

## 2019-03-16 NOTE — ED Notes (Signed)
Pt arrived to Rm [facility] via stretcher. Pt alert. Denies any c/o pain or any needs. Call bell w/in reach. Nods head when RN advised waiting for [facility] to transport her back to SNF. Paperwork is on stretcher w/pt.

## 2019-03-16 NOTE — ED Notes (Signed)
PTAR transporting pt back to [facility].

## 2019-03-16 NOTE — ED Provider Notes (Signed)
[facility] EMERGENCY DEPARTMENT Provider Note   CSN: [id number] Arrival date & time: 03/16/19  1000     History No chief complaint on file.   [facility]ang is a 54 y.o. female.  The history is provided by the patient and medical records. No language interpreter was used.   Rachel Vang is a 54 y.o. female who presents to the Emergency Department complaining of g tube out. She presents to the ED by EMS from [facility] for evaluation following dislodgement of her PEG tube. She has had a feeding tube in place for at least 6 months. This morning she was found to have her feeding tube displaced. Patient denies any acute complaints. It is unclear when the feeding tube became dislodged. Patient has no additional complaints.  Denies fevers, chest pain, nausea, vomiting, diarrhea.       Past Medical History:  Diagnosis Date  . Acute pulmonary embolism (HCC) 02/19/2015  . Acute respiratory failure (HCC)   . Diabetes mellitus without complication (HCC)    Type 2, W/o complications  . DVT (deep venous thrombosis) (HCC) 04/05/2015  . Dysphagia   . Epilepsy (HCC)   . GERD (gastroesophageal reflux disease)   . Hyperlipidemia   . Hypertension   . IBS (irritable bowel syndrome)   . Nontraumatic subarachnoid hemorrhage (HCC)   . SAH (subarachnoid hemorrhage) (HCC)   . Urinary retention     Patient Active Problem List   Diagnosis Date Noted  . Hemiplegia as late effect of cerebrovascular accident (CVA) (HCC) 09/19/2017  . Fever 09/06/2017  . Slow transit constipation 06/08/2017  . Insomnia 06/08/2017  . Generalized weakness 06/07/2017  . Aspiration pneumonia of both lower lobes due to gastric secretions (HCC)   . Demand ischemia (HCC)   . Chronic pain of right knee   . Chronic heel pain, right   . Small bowel obstruction (HCC)   . SVT (supraventricular tachycardia) (HCC)   . AKI (acute kidney injury) (HCC) 05/06/2017  . Hypernatremia 05/06/2017  .  Dyslipidemia associated with type 2 diabetes mellitus (HCC) 04/27/2017  . Gingivitis, acute, plaque induced 03/26/2017  . Hypersecretion of saliva 03/14/2017  . Bruxism (teeth grinding) 03/14/2017  . Lobar pneumonia (HCC)   . Acute on chronic respiratory failure with hypoxia (HCC)   . Pressure injury of skin 02/16/2017  . Foot drop, right foot 10/18/2016  . CVA (cerebrovascular accident) (HCC) 08/30/2016  . GERD without esophagitis 08/30/2016  . Chronic pulmonary embolism (HCC) 07/03/2016  . Weight loss, non-intentional 06/13/2016  . Type II diabetes mellitus with neurological manifestations (HCC) 05/20/2015  . Essential hypertension, benign 05/20/2015  . Status post insertion of percutaneous endoscopic gastrostomy (PEG) tube (HCC) 05/20/2015  . DVT (deep venous thrombosis) (HCC) 04/05/2015  . Protein-calorie malnutrition, severe (HCC) 03/25/2015  . Dysphagia 02/18/2015  . Seizures (HCC) 02/18/2015  . Elevated troponin 02/18/2015  . Urine retention 02/18/2015  . History of ETT   . Subarachnoid hemorrhage [facility])     Past Surgical History:  Procedure Laterality Date  . ABDOMINAL SURGERY    . ANEURYSM COILING    . COLONOSCOPY N/A 02/20/2015   Procedure: COLONOSCOPY;  Surgeon: Iva Boop, MD;  Location: [facility] ENDOSCOPY;  Service: Endoscopy;  Laterality: N/A;  . ESOPHAGOGASTRODUODENOSCOPY (EGD) WITH PROPOFOL N/A 02/02/2015   Procedure: ESOPHAGOGASTRODUODENOSCOPY (EGD) WITH PROPOFOL;  Surgeon: Jimmye Norman, MD;  Location: [facility] ENDOSCOPY;  Service: General;  Laterality: N/A;  . IR GENERIC HISTORICAL  08/26/2015   IR GASTRIC TUBE  PERC CHG W/O IMG GUIDE 08/26/2015 Irish Lack, MD [facility]-INTERV RAD  . IR GENERIC HISTORICAL  09/14/2015   IR REPLC GASTRO/COLONIC TUBE PERCUT W/FLUORO 09/14/2015 Darrell K Allred, PA-C [facility]-INTERV RAD  . IR REPLACE G-TUBE SIMPLE WO FLUORO  06/08/2016  . IR REPLACE G-TUBE SIMPLE WO FLUORO  10/24/2016  . IR REPLACE G-TUBE SIMPLE WO FLUORO  01/07/2017  . PEG PLACEMENT N/A  02/02/2015   Procedure: PERCUTANEOUS ENDOSCOPIC GASTROSTOMY (PEG) PLACEMENT;  Surgeon: Jimmye Norman, MD;  Location: [facility] ENDOSCOPY;  Service: General;  Laterality: N/A;  . RADIOLOGY WITH ANESTHESIA N/A 01/13/2015   Procedure: RADIOLOGY WITH ANESTHESIA;  Surgeon: Lisbeth Renshaw, MD;  Location: [facility] OR;  Service: Radiology;  Laterality: N/A;  . TRACHEOSTOMY       OB History   No obstetric history on file.     Family History  Problem Relation Age of Onset  . Hypertension Other     Social History   Tobacco Use  . Smoking status: Former Games developer  . Smokeless tobacco: Never Used  Substance Use Topics  . Alcohol use: No  . Drug use: No    Home Medications Prior to Admission medications   Medication Sig Start Date End Date Taking? Authorizing Provider  acetaminophen (TYLENOL) 325 MG tablet Give 2 tablets via G-tube every 6 hours for pain routinely and every 4 hours as needed    [provider]  bethanechol (URECHOLINE) 10 MG tablet Place 10 mg into feeding tube 3 (three) times daily.  09/08/17   [provider]  ENOXAPARIN SODIUM [place] Inject 50 mg into the skin every 12 (twelve) hours.  09/10/17   [provider]  gabapentin (NEURONTIN) 100 MG capsule Give 200 mg Per G-Tube two times daily HOLD IF LETHARGIC 09/08/17   Ellwood Sayers, MD  levETIRAcetam (KEPPRA) 100 MG/ML solution Place 5 mLs (500 mg total) into feeding tube 2 (two) times daily. 02/11/15   Leroy Sea, MD  metoprolol tartrate (LOPRESSOR) 25 MG tablet Place 25 mg into feeding tube 2 (two) times daily. Hold if SBP < 100 apical pulse    [provider]  Nutritional Supplements (FEEDING SUPPLEMENT, JEVITY 1.5 CAL,) LIQD Administer Jevity 1.5 @ 200 ml every 4 hours daily  Flush tube with 30 ml water prior and post administration    [provider]  Nutritional Supplements (NUTRITIONAL SUPPLEMENT PO) Take by mouth. NPO - Nothing by mouth Diet -  Nothing by mouth texture    [provider]  Nutritional Supplements (PROMOD) LIQD Take 30 mLs by mouth daily. 10/22/17   [provider]  polyethylene glycol (MIRALAX / GLYCOLAX) packet Place 17 g into feeding tube daily.     [provider]  Probiotic Product (PROBIOTIC DAILY PO) Place 1 capsule into feeding tube 2 (two) times daily. 09/08/17   [provider]  ranitidine (ZANTAC) 150 MG tablet Place 150 mg into feeding tube daily.  04/25/17   [provider]  rosuvastatin (CRESTOR) 5 MG tablet Place 5 mg into feeding tube daily.  09/24/17   [provider]  sertraline (ZOLOFT) 100 MG tablet Place 100 mg into feeding tube daily.  07/12/17   [provider]  traZODone (DESYREL) 50 MG tablet Place 50 mg into feeding tube at bedtime.    [provider]  Water For Irrigation, Sterile (STERILE WATER FOR IRRIGATION) 200 cc water flushes via g-tube every 4 hours    [provider]    Allergies    Patient has  no known allergies.  Review of Systems   Review of Systems  All other systems reviewed and are negative.   Physical Exam Updated Vital Signs BP (!) 108/96   Pulse 70   Temp 98.3 F (36.8 C) (Oral)   Resp 17   LMP  (LMP Unknown)   SpO2 96%   Physical Exam Vitals and nursing note reviewed.  Constitutional:      Appearance: She is well-developed.  HENT:     Head: Normocephalic and atraumatic.  Cardiovascular:     Rate and Rhythm: Normal rate and regular rhythm.  Pulmonary:     Effort: Pulmonary effort is normal. No respiratory distress.  Abdominal:     Palpations: Abdomen is soft.     Tenderness: There is no guarding or rebound.     Comments: Stoma in the left upper quadrant with minimal local erythema. Mild abdominal tenderness  Musculoskeletal:        General: No tenderness.  Skin:    General: Skin is warm and dry.  Neurological:     Mental Status: She is alert and oriented to person, place, and time.  Psychiatric:        Behavior:  Behavior normal.     ED Results / Procedures / Treatments   Labs (all labs ordered are listed, but only abnormal results are displayed) Labs Reviewed - No data to display  EKG None  Radiology DG ABDOMEN PEG TUBE LOCATION  Result Date: 03/16/2019 CLINICAL DATA:  Check PEG tube placement. EXAM: ABDOMEN - 1 VIEW COMPARISON:  04/25/2018 FINDINGS: The feeding gastrostomy tube is in good position in the antral region of the stomach. Contrast flows into the duodenum and proximal jejunum. No worrisome leaking contrast. IMPRESSION: Well-positioned feeding gastrostomy tube. Electronically Signed   By: Rudie Meyer M.D.   On: 03/16/2019 12:19    Procedures Gastrostomy tube replacement  Date/Time: 03/16/2019 12:47 PM Performed by: Tilden Fossa, MD Authorized by: Tilden Fossa, MD  Consent: Verbal consent obtained. Consent given by: patient Patient understanding: patient states understanding of the procedure being performed Patient identity confirmed: verbally with patient Local anesthesia used: no  Anesthesia: Local anesthesia used: no  Sedation: Patient sedated: no  Comments: Initially a 16 French Foley catheter was placed in patient's stomach pending availability of a peg tube. When peg tube became available a 16 French tube was placed and 7 mL of sterile water were installed to inflate the balloon.    (including critical care time)  Medications Ordered in ED Medications - No data to display  ED Course  I have reviewed the triage vital signs and the nursing notes.  Pertinent labs & imaging results that were available during my care of the patient were reviewed by me and considered in my medical decision making (see chart for details).    MDM Rules/Calculators/A&P                      Patient here for evaluation after her peg tube became dislodged. Tube was replaced per procedure note and confirmed. Patient without additional complaints, feeling well. Plan to discharge  back to the facility. Discussed the care with patient's son. Attempted to contact [facility] -unable to reach staff member that is familiar with the patient. Presentation is not consistent with perforation, serious bacterial infection.   Final Clinical Impression(s) / ED Diagnoses Final diagnoses:  PEG (percutaneous endoscopic gastrostomy) adjustment/replacement/removal (HCC)    Rx / DC Orders ED Discharge Orders  None       Quintella Reichert, MD 03/16/19 1251

## 2019-03-16 NOTE — ED Notes (Signed)
Received 16 FR Peg-Tube from IR.

## 2019-03-16 NOTE — ED Triage Notes (Addendum)
Per EMS: Pt from [facility] with c/o G-Tube not present on assessment by staff at facility this AM.  Pt has a HX of stroke, HTN, HLD, and DM.  EMS VItals: BP 120/90 Pulse 68 RR 16 Sp02 100 RA  CBG 129

## 2019-03-16 NOTE — ED Notes (Addendum)
Attempted report to [facility] twice.  Left number for nursing staff to contact this RN.

## 2019-04-04 ENCOUNTER — Encounter (HOSPITAL_COMMUNITY): Payer: Self-pay | Admitting: Emergency Medicine

## 2019-04-04 ENCOUNTER — Emergency Department (HOSPITAL_COMMUNITY)
Admission: EM | Admit: 2019-04-04 | Discharge: 2019-04-04 | Disposition: A | Discharge status: Home / Self Care | Payer: Medicaid Other | Attending: Emergency Medicine | Admitting: Emergency Medicine

## 2019-04-04 DIAGNOSIS — Z87891 Personal history of nicotine dependence: Secondary | ICD-10-CM | POA: Insufficient documentation

## 2019-04-04 DIAGNOSIS — R040 Epistaxis: Secondary | ICD-10-CM | POA: Diagnosis not present

## 2019-04-04 DIAGNOSIS — I10 Essential (primary) hypertension: Secondary | ICD-10-CM | POA: Insufficient documentation

## 2019-04-04 DIAGNOSIS — Z79899 Other long term (current) drug therapy: Secondary | ICD-10-CM | POA: Diagnosis not present

## 2019-04-04 DIAGNOSIS — E119 Type 2 diabetes mellitus without complications: Secondary | ICD-10-CM | POA: Diagnosis not present

## 2019-04-04 MED ORDER — OXYMETAZOLINE HCL 0.05 % NA SOLN
1.0000 | Freq: Once | NASAL | Status: AC
  Administered 2019-04-04: 10:00:00 1 via NASAL
  Filled 2019-04-04: qty 30

## 2019-04-04 NOTE — ED Notes (Signed)
[facility]AR arrived to transport pt. Two attempts made to call report to [facility] with no answer. Pt transferred out of ED via stretcher safely and in NAD.

## 2019-04-04 NOTE — ED Notes (Addendum)
Enters my care. Report from night RN. Pt resting in stretcher. Awake, alert, Denies pain. Epistaxis controlled at this time. Face cleaned of dried blood. VSS.  Provider at bedside advises to monitor pt status and re-eval for further bleeding. NAD at this time. No complaints. Cont to monitor.

## 2019-04-04 NOTE — Discharge Instructions (Addendum)
If patient has another nosebleed, have patient blow out all blood clots.  Apply 2 to 3 sprays of Afrin up affected nare and hold direct pressure for at least 15 minutes.

## 2019-04-04 NOTE — ED Notes (Signed)
PTAR called for pt transport back to [facility]. Pt resting. NAD. Cont to monitor.

## 2019-04-04 NOTE — ED Provider Notes (Signed)
[facility]-EMERGENCY DEPT Provider Note   CSN: [id number] Arrival date & time: 04/04/19  6962     History Chief Complaint  Patient presents with  . Epistaxis    Rachel Vang is a 54 y.o. female.  The history is provided by the patient and the EMS personnel.  Epistaxis Location:  R nare Severity:  Mild Duration:  60 minutes Timing:  Rare Progression:  Resolved Chronicity:  Recurrent Context: anticoagulants   Relieved by:  Applying pressure and vasoconstrictors Worsened by:  Nothing Associated symptoms: no blood in oropharynx, no congestion, no cough, no dizziness, no facial pain, no fever, no headaches, no sinus pain, no sneezing, no sore throat and no syncope   Risk factors: frequent nosebleeds (2-3 times a year)        Past Medical History:  Diagnosis Date  . Acute pulmonary embolism (HCC) 02/19/2015  . Acute respiratory failure (HCC)   . Diabetes mellitus without complication (HCC)    Type 2, W/o complications  . DVT (deep venous thrombosis) (HCC) 04/05/2015  . Dysphagia   . Epilepsy (HCC)   . GERD (gastroesophageal reflux disease)   . Hyperlipidemia   . Hypertension   . IBS (irritable bowel syndrome)   . Nontraumatic subarachnoid hemorrhage (HCC)   . SAH (subarachnoid hemorrhage) (HCC)   . Urinary retention     Patient Active Problem List   Diagnosis Date Noted  . Hemiplegia as late effect of cerebrovascular accident (CVA) (HCC) 09/19/2017  . Fever 09/06/2017  . Slow transit constipation 06/08/2017  . Insomnia 06/08/2017  . Generalized weakness 06/07/2017  . Aspiration pneumonia of both lower lobes due to gastric secretions (HCC)   . Demand ischemia (HCC)   . Chronic pain of right knee   . Chronic heel pain, right   . Small bowel obstruction (HCC)   . SVT (supraventricular tachycardia) (HCC)   . AKI (acute kidney injury) (HCC) 05/06/2017  . Hypernatremia 05/06/2017  . Dyslipidemia associated with type 2 diabetes mellitus  (HCC) 04/27/2017  . Gingivitis, acute, plaque induced 03/26/2017  . Hypersecretion of saliva 03/14/2017  . Bruxism (teeth grinding) 03/14/2017  . Lobar pneumonia (HCC)   . Acute on chronic respiratory failure with hypoxia (HCC)   . Pressure injury of skin 02/16/2017  . Foot drop, right foot 10/18/2016  . CVA (cerebrovascular accident) (HCC) 08/30/2016  . GERD without esophagitis 08/30/2016  . Chronic pulmonary embolism (HCC) 07/03/2016  . Weight loss, non-intentional 06/13/2016  . Type II diabetes mellitus with neurological manifestations (HCC) 05/20/2015  . Essential hypertension, benign 05/20/2015  . Status post insertion of percutaneous endoscopic gastrostomy (PEG) tube (HCC) 05/20/2015  . DVT (deep venous thrombosis) (HCC) 04/05/2015  . Protein-calorie malnutrition, severe (HCC) 03/25/2015  . Dysphagia 02/18/2015  . Seizures (HCC) 02/18/2015  . Elevated troponin 02/18/2015  . Urine retention 02/18/2015  . History of ETT   . Subarachnoid hemorrhage [facility])     Past Surgical History:  Procedure Laterality Date  . ABDOMINAL SURGERY    . ANEURYSM COILING    . COLONOSCOPY N/A 02/20/2015   Procedure: COLONOSCOPY;  Surgeon: Iva Boop, MD;  Location: [facility] ENDOSCOPY;  Service: Endoscopy;  Laterality: N/A;  . ESOPHAGOGASTRODUODENOSCOPY (EGD) WITH PROPOFOL N/A 02/02/2015   Procedure: ESOPHAGOGASTRODUODENOSCOPY (EGD) WITH PROPOFOL;  Surgeon: Jimmye Norman, MD;  Location: [facility] ENDOSCOPY;  Service: General;  Laterality: N/A;  . IR GENERIC HISTORICAL  08/26/2015   IR GASTRIC TUBE PERC CHG W/O IMG GUIDE 08/26/2015 Irish Lack, MD [facility]-INTERV RAD  .  IR GENERIC HISTORICAL  09/14/2015   IR REPLC GASTRO/COLONIC TUBE PERCUT W/FLUORO 09/14/2015 Darrell K Allred, PA-C [facility]-INTERV RAD  . IR REPLACE G-TUBE SIMPLE WO FLUORO  06/08/2016  . IR REPLACE G-TUBE SIMPLE WO FLUORO  10/24/2016  . IR REPLACE G-TUBE SIMPLE WO FLUORO  01/07/2017  . PEG PLACEMENT N/A 02/02/2015   Procedure: PERCUTANEOUS ENDOSCOPIC  GASTROSTOMY (PEG) PLACEMENT;  Surgeon: Jimmye Norman, MD;  Location: [facility] ENDOSCOPY;  Service: General;  Laterality: N/A;  . RADIOLOGY WITH ANESTHESIA N/A 01/13/2015   Procedure: RADIOLOGY WITH ANESTHESIA;  Surgeon: Lisbeth Renshaw, MD;  Location: [facility] OR;  Service: Radiology;  Laterality: N/A;  . TRACHEOSTOMY       OB History   No obstetric history on file.     Family History  Problem Relation Age of Onset  . Hypertension Other     Social History   Tobacco Use  . Smoking status: Former Games developer  . Smokeless tobacco: Never Used  Substance Use Topics  . Alcohol use: No  . Drug use: No    Home Medications Prior to Admission medications   Medication Sig Start Date End Date Taking? Authorizing Provider  acetaminophen (TYLENOL) 325 MG tablet Give 2 tablets via G-tube every 6 hours for pain routinely and every 4 hours as needed    [provider]  bethanechol (URECHOLINE) 10 MG tablet Place 10 mg into feeding tube 3 (three) times daily.  09/08/17   [provider]  ENOXAPARIN SODIUM [place] Inject 50 mg into the skin every 12 (twelve) hours.  09/10/17   [provider]  gabapentin (NEURONTIN) 100 MG capsule Give 200 mg Per G-Tube two times daily HOLD IF LETHARGIC 09/08/17   Ellwood Sayers, MD  levETIRAcetam (KEPPRA) 100 MG/ML solution Place 5 mLs (500 mg total) into feeding tube 2 (two) times daily. 02/11/15   Leroy Sea, MD  metoprolol tartrate (LOPRESSOR) 25 MG tablet Place 25 mg into feeding tube 2 (two) times daily. Hold if SBP < 100 apical pulse    [provider]  Nutritional Supplements (FEEDING SUPPLEMENT, JEVITY 1.5 CAL,) LIQD Administer Jevity 1.5 @ 200 ml every 4 hours daily  Flush tube with 30 ml water prior and post administration    [provider]  Nutritional Supplements (NUTRITIONAL SUPPLEMENT PO) Take by mouth. NPO - Nothing by mouth Diet -  Nothing by mouth texture    [provider]  Nutritional Supplements (PROMOD)  LIQD Take 30 mLs by mouth daily. 10/22/17   [provider]  polyethylene glycol (MIRALAX / GLYCOLAX) packet Place 17 g into feeding tube daily.     [provider]  Probiotic Product (PROBIOTIC DAILY PO) Place 1 capsule into feeding tube 2 (two) times daily. 09/08/17   [provider]  ranitidine (ZANTAC) 150 MG tablet Place 150 mg into feeding tube daily.  04/25/17   [provider]  rosuvastatin (CRESTOR) 5 MG tablet Place 5 mg into feeding tube daily.  09/24/17   [provider]  sertraline (ZOLOFT) 100 MG tablet Place 100 mg into feeding tube daily.  07/12/17   [provider]  traZODone (DESYREL) 50 MG tablet Place 50 mg into feeding tube at bedtime.    [provider]  Water For Irrigation, Sterile (STERILE WATER FOR IRRIGATION) 200 cc water flushes via g-tube every 4 hours    [provider]    Allergies    Patient has no known allergies.  Review of Systems   Review of Systems  Constitutional: Negative for chills and fever.  HENT: Positive for nosebleeds. Negative for congestion, ear pain, sinus pain, sneezing and sore throat.   Eyes: Negative for pain and visual disturbance.  Respiratory: Negative for cough and shortness of breath.   Cardiovascular: Negative for chest pain, palpitations and syncope.  Gastrointestinal: Negative for abdominal pain and vomiting.  Genitourinary: Negative for dysuria and hematuria.  Musculoskeletal: Negative for arthralgias and back pain.  Skin: Negative for color change and rash.  Neurological: Negative for dizziness, seizures, syncope and headaches.  All other systems reviewed and are negative.   Physical Exam Updated Vital Signs  ED Triage Vitals  Enc Vitals Group     BP 04/04/19 0702 114/80     Pulse Rate 04/04/19 0702 78     Resp 04/04/19 0702 16     Temp 04/04/19 0702 98.2 F (36.8 C)     Temp Source 04/04/19 0702 Oral     SpO2 04/04/19 0655 95 %     Weight 04/04/19  0702 112 lb (50.8 kg)     Height 04/04/19 0702 5\' 6"  (1.676 m)     Head Circumference --      Peak Flow --      Pain Score 04/04/19 0655 5     Pain Loc --      Pain Edu? --      Excl. in GC? --     Physical Exam Vitals and nursing note reviewed.  Constitutional:      General: She is not in acute distress.    Appearance: She is well-developed.  HENT:     Head: Normocephalic and atraumatic.     Nose: No congestion or rhinorrhea.     Comments: Right nare with dried blood but no clots, dried blood around nose Eyes:     Extraocular Movements: Extraocular movements intact.     Conjunctiva/sclera: Conjunctivae normal.     Pupils: Pupils are equal, round, and reactive to light.  Cardiovascular:     Rate and Rhythm: Normal rate and regular rhythm.     Heart sounds: No murmur.  Pulmonary:     Effort: Pulmonary effort is normal. No respiratory distress.     Breath sounds: Normal breath sounds.  Abdominal:     Palpations: Abdomen is soft.     Tenderness: There is no abdominal tenderness.  Musculoskeletal:     Cervical back: Neck supple.  Skin:    General: Skin is warm and dry.     Findings: No bruising or rash.  Neurological:     General: No focal deficit present.     Mental Status: She is alert.  Psychiatric:        Mood and Affect: Mood normal.     ED Results / Procedures / Treatments   Labs (all labs ordered are listed, but only abnormal results are displayed) Labs Reviewed - No data to display  EKG None  Radiology No results found.  Procedures Procedures (including critical care time)  Medications Ordered in ED Medications  oxymetazoline (AFRIN) 0.05 % nasal spray 1 spray (has no administration in time range)    ED Course  I have reviewed the triage vital signs and the nursing notes.  Pertinent labs & imaging results that were available during my care of the patient were reviewed by me and considered in my medical decision making (see chart for details).     MDM Rules/Calculators/A&P  ALLYSSA ABRUZZESE is a 54 year old female with history of hypertension, high cholesterol, CAD, PE on Lovenox who presents to the ED with nosebleed.  Patient with normal vitals.  No fever.  Bleeding started about an hour ago.  EMS provided nasal spray and pressure and bleeding has resolved upon my evaluation.  She was observed for 2 hours with no episodes of rebleed.  Gave Afrin to go home with.  Educated about how to stop further nosebleeds.  Discharged in good condition.  Final Clinical Impression(s) / ED Diagnoses Final diagnoses:  Epistaxis    Rx / DC Orders ED Discharge Orders    None       Lennice Sites, DO 04/04/19 3716

## 2019-04-04 NOTE — ED Triage Notes (Signed)
Pt comes to ed via ems, c/o she is having a prolonged nose bleed, for over a hour, pt comes from a nursing facility,[facility]. Facility called ems for evaluation.The pt is on a blood thinner and her base line is an cognitive deficit, ems gave afrin 2 squirts in right nare, with little resolve. bp 160/100, pluses 79, rr16, so2 97, cbg 111,

## 2019-08-03 NOTE — Progress Notes (Signed)
Therapist, nutritional Palliative Care Consult Note Telephone: [phone]  Fax: [phone]  PATIENT NAME: Rachel Vang DOB: [birth year] MRN: [id number]  PRIMARY CARE PROVIDER:   Patient, No Pcp Per   REFERRING PROVIDER:  No referring provider defined for this encounter.  RESPONSIBLE PARTY:   Self and  Hopfer,Carlton Son   [phone] primary decision maker    RECOMMENDATIONS and PLAN:  Palliative care encounter  Z51.5  1.  Advance care planning:  MOST form is complete and copy in chart.  CPR and full scope of treatment are delegations.  Long term plans are for patient to continue to reside at [facility].  Longevity and increase of activities.  Palliative care has been asked to assist with goals of care.   2.  Dysphagia:  Improving.  Continue oral and tube feedings per recommendations of nutritionist and speech therapy.  Continue aspiration precautions.   3.  Weakness:  Chronic and due to subarachnoid hemorrhage. At baseline.  Provide supportive care as needed.   Due to the COVID-19 crisis, this visit took place in my office via telehealth and was initiated and consented by the patient and/or family.   I spent 30 minutes providing this consultation,  From 1400  to 1430. More than 50% of the time in this consultation was spent coordinating communication with the clinical staff, SW and patient.   HISTORY OF PRESENT ILLNESS:  Follow-up with Rachel Vang.  Clinical staff report no acute illnesses or injuries. She has an increased appetite as well as continues tube feedings via PEG.  She continues to require assistance with all ADLs.  Palliative Care was asked to help address goals of care.   CODE STATUS: Attempt CPR  PPS: 40% HOSPICE ELIGIBILITY/DIAGNOSIS: TBD  PAST MEDICAL HISTORY:  Past Medical History:  Diagnosis Date  . Acute pulmonary embolism (HCC) 02/19/2015  . Acute respiratory failure (HCC)   . Diabetes mellitus without complication (HCC)     Type 2, W/o complications  . DVT (deep venous thrombosis) (HCC) 04/05/2015  . Dysphagia   . Epilepsy (HCC)   . GERD (gastroesophageal reflux disease)   . Hyperlipidemia   . Hypertension   . IBS (irritable bowel syndrome)   . Nontraumatic subarachnoid hemorrhage (HCC)   . SAH (subarachnoid hemorrhage) (HCC)   . Urinary retention     SOCIAL HX:  Long-term resident of SNF   PERTINENT MEDICATIONS:  Outpatient Encounter Medications as of 08/06/2018  Medication Sig  . acetaminophen (TYLENOL) 325 MG tablet Give 2 tablets via G-tube every 6 hours for pain routinely and every 4 hours as needed  . bethanechol (URECHOLINE) 10 MG tablet Place 10 mg into feeding tube 3 (three) times daily.   Marland Kitchen ENOXAPARIN SODIUM [place] Inject 50 mg into the skin every 12 (twelve) hours.   . gabapentin (NEURONTIN) 100 MG capsule Give 200 mg Per G-Tube two times daily HOLD IF LETHARGIC  . levETIRAcetam (KEPPRA) 100 MG/ML solution Place 5 mLs (500 mg total) into feeding tube 2 (two) times daily.  . metoprolol tartrate (LOPRESSOR) 25 MG tablet Place 25 mg into feeding tube 2 (two) times daily. Hold if SBP < 100 apical pulse  . Nutritional Supplements (FEEDING SUPPLEMENT, JEVITY 1.5 CAL,) LIQD Administer Jevity 1.5 @ 200 ml every 4 hours daily  Flush tube with 30 ml water prior and post administration  . Nutritional Supplements (NUTRITIONAL SUPPLEMENT PO) Take by mouth. NPO - Nothing by mouth Diet -  Nothing by mouth texture  .  Nutritional Supplements (PROMOD) LIQD Take 30 mLs by mouth daily.  . polyethylene glycol (MIRALAX / GLYCOLAX) packet Place 17 g into feeding tube daily.   . Probiotic Product (PROBIOTIC DAILY PO) Place 1 capsule into feeding tube 2 (two) times daily.  . ranitidine (ZANTAC) 150 MG tablet Place 150 mg into feeding tube daily.   . rosuvastatin (CRESTOR) 5 MG tablet Place 5 mg into feeding tube daily.   . sertraline (ZOLOFT) 100 MG tablet Place 100 mg into feeding tube daily.   . traZODone (DESYREL) 50 MG  tablet Place 50 mg into feeding tube at bedtime.  . Water For Irrigation, Sterile (STERILE WATER FOR IRRIGATION) 200 cc water flushes via g-tube every 4 hours   No facility-administered encounter medications on file as of 08/06/2018.    PHYSICAL EXAM:   General: NAD, frail appearing female reclining in bed Pulmonary: no increased respiratory effort Neurological: Alert and oriented. Weakness. Clear speech  Margaretha Sheffield, NP-C

## 2020-04-04 ENCOUNTER — Other Ambulatory Visit: Payer: Self-pay

## 2020-04-04 ENCOUNTER — Encounter (HOSPITAL_COMMUNITY): Payer: Self-pay | Admitting: Emergency Medicine

## 2020-04-04 ENCOUNTER — Emergency Department (HOSPITAL_COMMUNITY): Payer: Medicaid Other

## 2020-04-04 ENCOUNTER — Inpatient Hospital Stay (HOSPITAL_COMMUNITY)
Admission: EM | Admit: 2020-04-04 | Discharge: 2020-04-14 | DRG: 871 | Disposition: A | Discharge status: Skilled Nursing Facility | Payer: Medicaid Other | Source: Skilled Nursing Facility | Attending: Family Medicine | Admitting: Family Medicine

## 2020-04-04 DIAGNOSIS — I420 Dilated cardiomyopathy: Secondary | ICD-10-CM | POA: Diagnosis present

## 2020-04-04 DIAGNOSIS — J189 Pneumonia, unspecified organism: Secondary | ICD-10-CM | POA: Diagnosis not present

## 2020-04-04 DIAGNOSIS — Z87891 Personal history of nicotine dependence: Secondary | ICD-10-CM

## 2020-04-04 DIAGNOSIS — Z993 Dependence on wheelchair: Secondary | ICD-10-CM

## 2020-04-04 DIAGNOSIS — A419 Sepsis, unspecified organism: Secondary | ICD-10-CM | POA: Diagnosis not present

## 2020-04-04 DIAGNOSIS — E876 Hypokalemia: Secondary | ICD-10-CM | POA: Diagnosis present

## 2020-04-04 DIAGNOSIS — I82409 Acute embolism and thrombosis of unspecified deep veins of unspecified lower extremity: Secondary | ICD-10-CM | POA: Diagnosis present

## 2020-04-04 DIAGNOSIS — Z681 Body mass index (BMI) 19 or less, adult: Secondary | ICD-10-CM

## 2020-04-04 DIAGNOSIS — R627 Adult failure to thrive: Secondary | ICD-10-CM | POA: Diagnosis present

## 2020-04-04 DIAGNOSIS — I69091 Dysphagia following nontraumatic subarachnoid hemorrhage: Secondary | ICD-10-CM

## 2020-04-04 DIAGNOSIS — E870 Hyperosmolality and hypernatremia: Secondary | ICD-10-CM | POA: Diagnosis not present

## 2020-04-04 DIAGNOSIS — I2782 Chronic pulmonary embolism: Secondary | ICD-10-CM | POA: Diagnosis present

## 2020-04-04 DIAGNOSIS — E872 Acidosis: Secondary | ICD-10-CM | POA: Diagnosis present

## 2020-04-04 DIAGNOSIS — R000 Tachycardia, unspecified: Secondary | ICD-10-CM

## 2020-04-04 DIAGNOSIS — R1310 Dysphagia, unspecified: Secondary | ICD-10-CM | POA: Diagnosis present

## 2020-04-04 DIAGNOSIS — E1169 Type 2 diabetes mellitus with other specified complication: Secondary | ICD-10-CM | POA: Diagnosis present

## 2020-04-04 DIAGNOSIS — R339 Retention of urine, unspecified: Secondary | ICD-10-CM | POA: Diagnosis not present

## 2020-04-04 DIAGNOSIS — Z86718 Personal history of other venous thrombosis and embolism: Secondary | ICD-10-CM

## 2020-04-04 DIAGNOSIS — I5021 Acute systolic (congestive) heart failure: Secondary | ICD-10-CM | POA: Diagnosis not present

## 2020-04-04 DIAGNOSIS — R9431 Abnormal electrocardiogram [ECG] [EKG]: Secondary | ICD-10-CM | POA: Diagnosis present

## 2020-04-04 DIAGNOSIS — R6520 Severe sepsis without septic shock: Secondary | ICD-10-CM | POA: Diagnosis present

## 2020-04-04 DIAGNOSIS — E860 Dehydration: Secondary | ICD-10-CM | POA: Diagnosis present

## 2020-04-04 DIAGNOSIS — I493 Ventricular premature depolarization: Secondary | ICD-10-CM | POA: Diagnosis present

## 2020-04-04 DIAGNOSIS — I10 Essential (primary) hypertension: Secondary | ICD-10-CM | POA: Diagnosis present

## 2020-04-04 DIAGNOSIS — D6959 Other secondary thrombocytopenia: Secondary | ICD-10-CM | POA: Diagnosis present

## 2020-04-04 DIAGNOSIS — I110 Hypertensive heart disease with heart failure: Secondary | ICD-10-CM | POA: Diagnosis present

## 2020-04-04 DIAGNOSIS — J9601 Acute respiratory failure with hypoxia: Secondary | ICD-10-CM | POA: Diagnosis present

## 2020-04-04 DIAGNOSIS — J690 Pneumonitis due to inhalation of food and vomit: Secondary | ICD-10-CM | POA: Diagnosis present

## 2020-04-04 DIAGNOSIS — E785 Hyperlipidemia, unspecified: Secondary | ICD-10-CM | POA: Diagnosis present

## 2020-04-04 DIAGNOSIS — E1149 Type 2 diabetes mellitus with other diabetic neurological complication: Secondary | ICD-10-CM | POA: Diagnosis present

## 2020-04-04 DIAGNOSIS — K219 Gastro-esophageal reflux disease without esophagitis: Secondary | ICD-10-CM | POA: Diagnosis present

## 2020-04-04 DIAGNOSIS — I69054 Hemiplegia and hemiparesis following nontraumatic subarachnoid hemorrhage affecting left non-dominant side: Secondary | ICD-10-CM

## 2020-04-04 DIAGNOSIS — I313 Pericardial effusion (noninflammatory): Secondary | ICD-10-CM | POA: Diagnosis present

## 2020-04-04 DIAGNOSIS — D638 Anemia in other chronic diseases classified elsewhere: Secondary | ICD-10-CM | POA: Diagnosis present

## 2020-04-04 DIAGNOSIS — I69359 Hemiplegia and hemiparesis following cerebral infarction affecting unspecified side: Secondary | ICD-10-CM

## 2020-04-04 DIAGNOSIS — I3139 Other pericardial effusion (noninflammatory): Secondary | ICD-10-CM

## 2020-04-04 DIAGNOSIS — K580 Irritable bowel syndrome with diarrhea: Secondary | ICD-10-CM | POA: Diagnosis present

## 2020-04-04 DIAGNOSIS — E440 Moderate protein-calorie malnutrition: Secondary | ICD-10-CM | POA: Diagnosis present

## 2020-04-04 DIAGNOSIS — Z79899 Other long term (current) drug therapy: Secondary | ICD-10-CM

## 2020-04-04 DIAGNOSIS — Z20822 Contact with and (suspected) exposure to covid-19: Secondary | ICD-10-CM | POA: Diagnosis present

## 2020-04-04 DIAGNOSIS — G40909 Epilepsy, unspecified, not intractable, without status epilepticus: Secondary | ICD-10-CM | POA: Diagnosis present

## 2020-04-04 DIAGNOSIS — R569 Unspecified convulsions: Secondary | ICD-10-CM

## 2020-04-04 LAB — URINALYSIS, ROUTINE W REFLEX MICROSCOPIC
Bilirubin Urine: NEGATIVE
Glucose, UA: NEGATIVE mg/dL
Ketones, ur: NEGATIVE mg/dL
Nitrite: POSITIVE — AB
Protein, ur: 30 mg/dL — AB
Specific Gravity, Urine: 1.017 (ref 1.005–1.030)
pH: 5 (ref 5.0–8.0)

## 2020-04-04 LAB — LACTIC ACID, PLASMA
Lactic Acid, Venous: 2.6 mmol/L (ref 0.5–1.9)
Lactic Acid, Venous: 6.7 mmol/L (ref 0.5–1.9)
Lactic Acid, Venous: 5.8 mmol/L (ref 0.5–1.9)

## 2020-04-04 LAB — MAGNESIUM: Magnesium: 2.3 mg/dL (ref 1.7–2.4)

## 2020-04-04 LAB — COMPREHENSIVE METABOLIC PANEL
ALT: 12 U/L (ref 0–44)
AST: 17 U/L (ref 15–41)
Albumin: 3.9 g/dL (ref 3.5–5.0)
Alkaline Phosphatase: 95 U/L (ref 38–126)
Anion gap: 12 (ref 5–15)
BUN: 37 mg/dL — ABNORMAL HIGH (ref 6–20)
CO2: 29 mmol/L (ref 22–32)
Calcium: 9.9 mg/dL (ref 8.9–10.3)
Chloride: 117 mmol/L — ABNORMAL HIGH (ref 98–111)
Creatinine, Ser: 1.06 mg/dL — ABNORMAL HIGH (ref 0.44–1.00)
GFR, Estimated: >60 mL/min (ref >60)
Glucose, Bld: 165 mg/dL — ABNORMAL HIGH (ref 70–99)
Potassium: 3.2 mmol/L — ABNORMAL LOW (ref 3.5–5.1)
Sodium: 158 mmol/L — ABNORMAL HIGH (ref 135–145)
Total Bilirubin: 0.9 mg/dL (ref 0.3–1.2)
Total Protein: 8.6 g/dL — ABNORMAL HIGH (ref 6.5–8.1)

## 2020-04-04 LAB — LIPASE, BLOOD: Lipase: 21 U/L (ref 11–51)

## 2020-04-04 LAB — RESP PANEL BY RT-PCR (FLU A&B, COVID) ARPGX2
Influenza A by PCR: NEGATIVE
Influenza B by PCR: NEGATIVE
SARS Coronavirus 2 by RT PCR: NEGATIVE

## 2020-04-04 LAB — CBC WITH DIFFERENTIAL/PLATELET
Abs Immature Granulocytes: 0.05 10*3/uL (ref 0.00–0.07)
Basophils Absolute: 0.1 10*3/uL (ref 0.0–0.1)
Basophils Relative: 1 %
Eosinophils Absolute: 0.1 10*3/uL (ref 0.0–0.5)
Eosinophils Relative: 1 %
HCT: 47.2 % — ABNORMAL HIGH (ref 36.0–46.0)
Hemoglobin: 14.4 g/dL (ref 12.0–15.0)
Immature Granulocytes: 0 %
Lymphocytes Relative: 13 %
Lymphs Abs: 1.9 10*3/uL (ref 0.7–4.0)
MCH: 30.4 pg (ref 26.0–34.0)
MCHC: 30.5 g/dL (ref 30.0–36.0)
MCV: 99.8 fL (ref 80.0–100.0)
Monocytes Absolute: 1 10*3/uL (ref 0.1–1.0)
Monocytes Relative: 7 %
Neutro Abs: 11.2 10*3/uL — ABNORMAL HIGH (ref 1.7–7.7)
Neutrophils Relative %: 78 %
Platelets: 214 10*3/uL (ref 150–400)
RBC: 4.73 MIL/uL (ref 3.87–5.11)
RDW: 15.9 % — ABNORMAL HIGH (ref 11.5–15.5)
WBC: 14.3 10*3/uL — ABNORMAL HIGH (ref 4.0–10.5)
nRBC: 0 % (ref 0.0–0.2)

## 2020-04-04 LAB — APTT: aPTT: 34 seconds (ref 24–36)

## 2020-04-04 LAB — PROTIME-INR
INR: 1.5 — ABNORMAL HIGH (ref 0.8–1.2)
Prothrombin Time: 17.7 seconds — ABNORMAL HIGH (ref 11.4–15.2)

## 2020-04-04 LAB — GLUCOSE, CAPILLARY: Glucose-Capillary: 122 mg/dL — ABNORMAL HIGH (ref 70–99)

## 2020-04-04 MED ORDER — SODIUM CHLORIDE 0.9 % IV SOLN
500.0000 mg | INTRAVENOUS | Status: DC
  Administered 2020-04-04: 500 mg via INTRAVENOUS
  Filled 2020-04-04 (×2): qty 500

## 2020-04-04 MED ORDER — LACTATED RINGERS IV BOLUS (SEPSIS)
1000.0000 mL | Freq: Once | INTRAVENOUS | Status: AC
  Administered 2020-04-04: 1000 mL via INTRAVENOUS

## 2020-04-04 MED ORDER — SODIUM CHLORIDE 0.9 % IV BOLUS: 1000.0000 mL | Freq: Once | INTRAVENOUS | Status: DC

## 2020-04-04 MED ORDER — ACETAMINOPHEN 325 MG PO TABS
650.0000 mg | ORAL_TABLET | Freq: Four times a day (QID) | ORAL | Status: DC | PRN
  Administered 2020-04-06 – 2020-04-11 (×4): 650 mg via ORAL
  Filled 2020-04-04 (×5): qty 2

## 2020-04-04 MED ORDER — SODIUM CHLORIDE 0.9 % IV SOLN
3.0000 g | Freq: Three times a day (TID) | INTRAVENOUS | Status: DC
  Administered 2020-04-05 – 2020-04-07 (×8): 3 g via INTRAVENOUS
  Filled 2020-04-04 (×2): qty 8
  Filled 2020-04-04: qty 3
  Filled 2020-04-04: qty 8
  Filled 2020-04-04 (×3): qty 3
  Filled 2020-04-04 (×2): qty 8

## 2020-04-04 MED ORDER — SODIUM CHLORIDE 0.45 % IV SOLN: INTRAVENOUS | Status: DC

## 2020-04-04 MED ORDER — LACTATED RINGERS IV BOLUS (SEPSIS)
250.0000 mL | Freq: Once | INTRAVENOUS | Status: AC
  Administered 2020-04-04: 250 mL via INTRAVENOUS

## 2020-04-04 MED ORDER — ONDANSETRON HCL 4 MG PO TABS: 4.0000 mg | ORAL_TABLET | Freq: Four times a day (QID) | ORAL | Status: DC | PRN

## 2020-04-04 MED ORDER — SODIUM CHLORIDE 0.9 % IV SOLN
2.0000 g | INTRAVENOUS | Status: DC
  Administered 2020-04-04: 2 g via INTRAVENOUS
  Filled 2020-04-04: qty 20

## 2020-04-04 MED ORDER — ONDANSETRON HCL 4 MG/2ML IJ SOLN: 4.0000 mg | Freq: Four times a day (QID) | INTRAMUSCULAR | Status: DC | PRN

## 2020-04-04 MED ORDER — LACTATED RINGERS IV BOLUS
1000.0000 mL | Freq: Once | INTRAVENOUS | Status: AC
  Administered 2020-04-04: 1000 mL via INTRAVENOUS

## 2020-04-04 MED ORDER — CHLORHEXIDINE GLUCONATE CLOTH 2 % EX PADS
6.0000 | MEDICATED_PAD | Freq: Every day | CUTANEOUS | Status: DC
  Administered 2020-04-05 – 2020-04-07 (×4): 6 via TOPICAL

## 2020-04-04 MED ORDER — POTASSIUM CHLORIDE 10 MEQ/100ML IV SOLN
10.0000 meq | INTRAVENOUS | Status: AC
  Administered 2020-04-04 – 2020-04-05 (×4): 10 meq via INTRAVENOUS
  Filled 2020-04-04 (×4): qty 100

## 2020-04-04 MED ORDER — LACTATED RINGERS IV BOLUS
1000.0000 mL | Freq: Once | INTRAVENOUS | Status: AC
  Administered 2020-04-05: 1000 mL via INTRAVENOUS

## 2020-04-04 MED ORDER — LACTATED RINGERS IV BOLUS (SEPSIS)
500.0000 mL | Freq: Once | INTRAVENOUS | Status: AC
  Administered 2020-04-04: 500 mL via INTRAVENOUS

## 2020-04-04 MED ORDER — ACETAMINOPHEN 650 MG RE SUPP: 650.0000 mg | Freq: Four times a day (QID) | RECTAL | Status: DC | PRN

## 2020-04-04 MED ORDER — PROCHLORPERAZINE EDISYLATE 10 MG/2ML IJ SOLN: 5.0000 mg | Freq: Four times a day (QID) | INTRAMUSCULAR | Status: DC | PRN

## 2020-04-04 MED ORDER — LACTATED RINGERS IV SOLN: INTRAVENOUS | Status: DC

## 2020-04-04 MED ORDER — MAGNESIUM SULFATE IN D5W 1-5 GM/100ML-% IV SOLN
1.0000 g | Freq: Once | INTRAVENOUS | Status: AC
  Administered 2020-04-04: 1 g via INTRAVENOUS
  Filled 2020-04-04: qty 100

## 2020-04-04 MED ORDER — ORAL CARE MOUTH RINSE
15.0000 mL | Freq: Two times a day (BID) | OROMUCOSAL | Status: DC
  Administered 2020-04-05 – 2020-04-14 (×20): 15 mL via OROMUCOSAL

## 2020-04-04 NOTE — Sepsis Progress Note (Signed)
[facility] is following the sepsis protocol for this patient.

## 2020-04-04 NOTE — Sepsis Progress Note (Signed)
Notified bedside nurse of need to draw repeat lactic acid.  Also requested updated weight to be placed in Epic.  

## 2020-04-04 NOTE — ED Triage Notes (Signed)
Patient presents from [place]. The facility called EMS out because they wanted [place] to place a feeding tube. The patient has not eaten in the last week. When EMS arrived they noted abnormal vitals listed below. Patient is contracted and non verbal at baseline.   EMS vitals: 155 HR 86%SPO2 on room air,  100% on 4L nasal cannula 97.4 Temp 116/86 BP 179 CBG

## 2020-04-04 NOTE — Progress Notes (Signed)
Pharmacy Antibiotic Note  Rachel Vang is a 55 y.o. female admitted on 04/04/2020 with aspiration PNA.  Pharmacy has been consulted for Unasyn dosing.  Plan: Unasyn 3gm q8     Temp (24hrs), Avg:97.9 F (36.6 C), Min:97.9 F (36.6 C), Max:97.9 F (36.6 C)  Recent Labs  Lab 04/04/20 1725 04/04/20 1728  WBC 14.3*  --   CREATININE 1.06*  --   LATICACIDVEN  --  2.6*    CrCl cannot be calculated (Unknown ideal weight.).    No Known Allergies  Antimicrobials this admission: 3/7 Ceftriaxone & Azithromycin x1 3/8 Unasyn >>   Dose adjustments this admission:  Microbiology results: 3/7 BCx: sent Urine Cx: ordered, needs collection Sputum: ordered, needs collection  Strep pneumo: ordered needs collection  Thank you for allowing pharmacy to be a part of this patient's care.  Otho Bellows 04/04/2020 8:37 PM

## 2020-04-04 NOTE — H&P (Signed)
History and Physical    Rachel Vang DOB: 1966/01/06 DOA: 04/04/2020  PCP: Patient, No Pcp Per   Patient coming from: Home.   I have personally briefly reviewed patient's old medical records in [facility]  Chief Complaint: Decreased oral intake.  HPI: Rachel Vang is a 55 y.o. female with medical history significant of pulmonary embolism, DVT, type 2 diabetes mellitus, epilepsy, GERD, hyperlipidemia, hypertension, nontraumatic subarachnoid hemorrhage, IBS, dysphagia who is being sent to the emergency department from her facility due to the patient not eating or drinking much in the past week.  The patient has been having dysphagia and may have had aspirated.  She is unable to provide further history, but is awake and alert and able to single yes or no to simple questions.  She denies headache, back, chest or abdominal pain at this time.  ED Course: Initial vital signs were temperature 97.9 F, pulse 155, respirations 87, BP 120/91 mmHg O2 sat 88% on 5 LPM.  The patient received 2750 mL of LR bolus, ceftriaxone and azithromycin in the emergency department.  I added magnesium sulfate 1 g IVPB due to prolonged QT and 2 boluses of 1000 mL of LR.  Labwork: Her urinalysis was positive for nitrates showed trace leukocyte esterase and few bacteria on microscopic examination.  CBC showed a white count of 14.3, hemoglobin 14.4 g/dL platelets 010.  PT 17.7 INR 1.5 and PTT 34.  Lactic acid was 2.6 and 6.7 then 5.8 mmol/L.  Sodium 158, potassium 3.2, chloride 117 and CO2 29 mmol/L.  Glucose 165, BUN 37 and creatinine 1.06 mg/dL.  Hepatic functions are normal except for a total protein of 8.6 g/dL.  Lipase was 21.  Coronavirus by PCR was negative.  Imaging: Portable 1 view chest radiograph showed new volume loss in the right hemithorax with a right perihilar opacity suspicious for aspiration.  Possibility of mucous plugging is also considered.  Please see image and full detailed  report for further detail.  Review of Systems: As per HPI otherwise all other systems reviewed and are negative.  Past Medical History:  Diagnosis Date  . Acute pulmonary embolism (HCC) 02/19/2015  . Acute respiratory failure (HCC)   . Diabetes mellitus without complication (HCC)    Type 2, W/o complications  . DVT (deep venous thrombosis) (HCC) 04/05/2015  . Dysphagia   . Epilepsy (HCC)   . GERD (gastroesophageal reflux disease)   . Hyperlipidemia   . Hypertension   . IBS (irritable bowel syndrome)   . Nontraumatic subarachnoid hemorrhage (HCC)   . SAH (subarachnoid hemorrhage) (HCC)   . Urinary retention    Past Surgical History:  Procedure Laterality Date  . ABDOMINAL SURGERY    . ANEURYSM COILING    . COLONOSCOPY N/A 02/20/2015   Procedure: COLONOSCOPY;  Surgeon: Iva Boop, MD;  Location: [facility] ENDOSCOPY;  Service: Endoscopy;  Laterality: N/A;  . ESOPHAGOGASTRODUODENOSCOPY (EGD) WITH PROPOFOL N/A 02/02/2015   Procedure: ESOPHAGOGASTRODUODENOSCOPY (EGD) WITH PROPOFOL;  Surgeon: Jimmye Norman, MD;  Location: [facility] ENDOSCOPY;  Service: General;  Laterality: N/A;  . IR GENERIC HISTORICAL  08/26/2015   IR GASTRIC TUBE PERC CHG W/O IMG GUIDE 08/26/2015 Irish Lack, MD [facility]-INTERV RAD  . IR GENERIC HISTORICAL  09/14/2015   IR REPLC GASTRO/COLONIC TUBE PERCUT W/FLUORO 09/14/2015 Darrell K Allred, PA-C [facility]-INTERV RAD  . IR REPLACE G-TUBE SIMPLE WO FLUORO  06/08/2016  . IR REPLACE G-TUBE SIMPLE WO FLUORO  10/24/2016  . IR REPLACE G-TUBE SIMPLE WO FLUORO  01/07/2017  . PEG PLACEMENT N/A 02/02/2015   Procedure: PERCUTANEOUS ENDOSCOPIC GASTROSTOMY (PEG) PLACEMENT;  Surgeon: Jimmye Norman, MD;  Location: [facility] ENDOSCOPY;  Service: General;  Laterality: N/A;  . RADIOLOGY WITH ANESTHESIA N/A 01/13/2015   Procedure: RADIOLOGY WITH ANESTHESIA;  Surgeon: Lisbeth Renshaw, MD;  Location: [facility] OR;  Service: Radiology;  Laterality: N/A;  . TRACHEOSTOMY     Social History  reports that she has quit smoking. She has  never used smokeless tobacco. She reports that she does not drink alcohol and does not use drugs.  No Known Allergies  Family History  Problem Relation Age of Onset  . Hypertension Other    Prior to Admission medications   Medication Sig Start Date End Date Taking? Authorizing Provider  acetaminophen (TYLENOL) 325 MG tablet Take 650 mg by mouth every 6 (six) hours.   Yes [provider]  bethanechol (URECHOLINE) 10 MG tablet Take 10 mg by mouth 3 (three) times daily. 09/08/17  Yes [provider]  docusate sodium (COLACE) 100 MG capsule Take 100 mg by mouth daily.   Yes [provider]  gabapentin (NEURONTIN) 100 MG capsule Take 200 mg by mouth 2 (two) times daily. 09/08/17  Yes Ellwood Sayers, MD  guaiFENesin (MUCINEX) 600 MG 12 hr tablet Take 600 mg by mouth every 6 (six) hours as needed for cough.   Yes [provider]  levETIRAcetam (KEPPRA) 100 MG/ML solution Place 5 mLs (500 mg total) into feeding tube 2 (two) times daily. Patient taking differently: Take 750 mg by mouth 2 (two) times daily. 02/11/15  Yes Leroy Sea, MD  mirtazapine (REMERON) 15 MG tablet Take 15 mg by mouth at bedtime.   Yes [provider]  NON FORMULARY Apply 1 application topically 2 (two) times daily. Hand palm protector   Yes [provider]  Nutritional Supplements (NUTRITIONAL SUPPLEMENT PO) Take 237 mLs by mouth 2 (two) times daily. Ensure   Yes [provider]  polyethylene glycol (MIRALAX / GLYCOLAX) packet Take 17 g by mouth daily.   Yes [provider]  Probiotic Product (PROBIOTIC DAILY PO) Take 1 capsule by mouth 2 (two) times daily. 09/08/17  Yes [provider]  rivaroxaban (XARELTO) 20 MG TABS tablet Take 20 mg by mouth at bedtime.   Yes [provider]  rosuvastatin (CRESTOR) 5 MG tablet Take 5 mg by mouth daily. 09/24/17  Yes [provider]  sertraline (ZOLOFT) 50 MG tablet Take 50 mg by mouth  daily.   Yes [provider]  traZODone (DESYREL) 100 MG tablet Take 100 mg by mouth at bedtime.   Yes [provider]  urea (CARMOL) 40 % CREA Apply 1 application topically daily. To both feet   Yes [provider]   Physical Exam: Vitals:   04/04/20 2000 04/04/20 2008 04/04/20 2013 04/04/20 2015  BP: 119/89     Pulse: (!) 131 (!) 133 (!) 132 (!) 134  Resp: 17 18 (!) 27 (!) 23  Temp:      TempSrc:      SpO2: (!) 87% 93% 91% (!) 87%   Constitutional: Looks chronically ill, but in NAD. Eyes: PERRL, lids and conjunctivae normal ENMT: Mucous membranes are mildly dry. Posterior pharynx clear of any exudate or lesions. Neck: normal, supple, no masses, no thyromegaly Respiratory: Mildly tachypneic.  Mild bilateral rhonchi, no wheezing, no crackles.  No accessory muscle use.  Cardiovascular: Tachycardic in the 120s, no murmurs / rubs / gallops. No extremity edema. 2+  pedal pulses. No carotid bruits.  Abdomen: Bowel sounds positive. Mild generalized tenderness, no guarding or rebound.  No masses palpated. No hepatosplenomegaly.  Musculoskeletal: no clubbing / cyanosis. Good ROM, no contractures.  Mild muscular atrophy..  Skin: no rashes, lesions, ulcers on very limited dermatological examination. Neurologic: Right-sided hemiparesis. Psychiatric: Aphasic unable to fully evaluate.  Labs on Admission: I have personally reviewed following labs and imaging studies  CBC: Recent Labs  Lab 04/04/20 1725  WBC 14.3*  NEUTROABS 11.2*  HGB 14.4  HCT 47.2*  MCV 99.8  PLT 214    Basic Metabolic Panel: Recent Labs  Lab 04/04/20 1725  NA 158*  K 3.2*  CL 117*  CO2 29  GLUCOSE 165*  BUN 37*  CREATININE 1.06*  CALCIUM 9.9  MG 2.3    GFR: CrCl cannot be calculated (Unknown ideal weight.).  Liver Function Tests: Recent Labs  Lab 04/04/20 1725  AST 17  ALT 12  ALKPHOS 95  BILITOT 0.9  PROT 8.6*  ALBUMIN 3.9   Radiological Exams on Admission: DG  Chest Portable 1 View  Result Date: 04/04/2020 CLINICAL DATA:  Hypoxia. EXAM: PORTABLE CHEST 1 VIEW COMPARISON:  Radiograph 10/07/2017 FINDINGS: New volume loss in the right hemithorax. Vague right perihilar opacity. Left lung is clear. The heart is normal in size. Aortic tortuosity versus rotation secondary to right lung volume loss. No pneumothorax or large pleural effusion. The bones are subjectively under mineralized. IMPRESSION: New volume loss in the right hemithorax with vague right perihilar opacity, findings suspicious for aspiration. Possibility of mucous plugging is also considered. Electronically Signed   By: Narda Rutherford M.D.   On: 04/04/2020 17:35    EKG: Independently reviewed. Vent. rate 152 BPM PR interval * ms QRS duration 80 ms QT/QTc 370/589 ms P-R-T axes -39 55 265 Sinus tachycardia Repol abnrm, prob ischemia, inferolateral lds Prolonged QT interval  Assessment/Plan Principal Problem:   Sepsis due to pneumonia (HCC) Secondary to aspiration due to dysphagia secondary to CVA. Keep n.p.o. for now. Supplemental oxygen as needed. Bronchodilators as needed. Check strep pneumoniae urinary antigen. Check sputum Gram stain culture and sensitivity. Follow-up blood culture and sensitivity. Unasyn per pharmacy.  Active Problems:   Hypernatremia In the setting of decreased oral intake. Continue IV fluids. Will likely need gastric tube given risk of aspiration.    Seizures (HCC) Switch Keppra to IV form.    DVT (deep venous thrombosis) (HCC)   Chronic pulmonary embolism (HCC) Continue Lovenox therapeutic dose twice daily.    Type II diabetes mellitus with neurological manifestations (HCC) CBG every 6 hours while NPO. Will likely need G-tube replacement. Consult nutritional services.    Essential hypertension, benign Parental antihypertensive as needed. Monitor blood pressure.    Dyslipidemia associated with type 2 diabetes mellitus (HCC) Holding  rosuvastatin at the moment.    Hemiplegia as late effect of cerebrovascular accident (CVA) (HCC) Supportive care.    Hypokalemia Replacing. Follow-up potassium level.    Prolonged QT interval Potassium and magnesium supplementation.   DVT prophylaxis: Lovenox therapeutic dose. Code Status:   Full code. Family Communication: Disposition Plan:   Patient is from:  [facility] nursing facility.  Anticipated DC to:  [facility] nursing facility.  Anticipated DC date:  04/06/2020.  Anticipated DC barriers: Clinical status.  Will likely need G-tube placement.  Consults called:  Routine gastroenterology consult. Admission status:  Observation/stepdown.  High due to presenting with tachypnea, tachycardia, leukocytosis and lactic acidosis in the setting of sepsis secondary to aspiration  pneumonia.  The patient will likely need a gastric tube placement due to failure to drive and high risk of aspiration.  Severity of Illness:  Bobette Moavid Manuel Mehreen Azizi MD [facility]  How to contact the [facility] Attending or Consulting provider 7A - 7P or covering provider during after hours 7P -7A, for this patient?   1. Check the care team in [facility] and look for a) attending/consulting [facility] provider listed and b) the [facility] team listed 2. Log into [email or web address] and use [place]'s universal password to access. If you do not have the password, please contact the hospital operator. 3. Locate the [facility] provider you are looking for under [facility] and page to a number that you can be directly reached. 4. If you still have difficulty reaching the provider, please page the [facility] (Director on Call) for the Hospitalists listed on amion for assistance.  04/04/2020, 8:33 PM   This document was prepared using Dragon voice recognition software and may contain some unintended transcription errors.

## 2020-04-04 NOTE — ED Provider Notes (Signed)
[facility]-EMERGENCY DEPT Provider Note   CSN: [id number] Arrival date & time: 04/04/20  1620     History Chief Complaint  Patient presents with  . Failure To Thrive    Rachel Vang is a 55 y.o. female.  HPI Level of 5 caveat secondary to patient's baseline mental status, nonverbal 55 year old female with an extensive medical history including SAH, nonverbal, hypertension, hyperlipidemia, DVT, DM type II, PE on Lovenox presents from [place] with reports of anorexia x1 week.  EMS was called so that we could place a feeding tube.  EMS noted that she was hypoxic, tachycardic with a rate of 155, placed on 4 L of nasal nasal cannula and improved to 100%.  Blood pressure is normal, CBG of 179.  Unfortunately history is limited as I was not able to reach her son or the facility at this time.    Past Medical History:  Diagnosis Date  . Acute pulmonary embolism (HCC) 02/19/2015  . Acute respiratory failure (HCC)   . Diabetes mellitus without complication (HCC)    Type 2, W/o complications  . DVT (deep venous thrombosis) (HCC) 04/05/2015  . Dysphagia   . Epilepsy (HCC)   . GERD (gastroesophageal reflux disease)   . Hyperlipidemia   . Hypertension   . IBS (irritable bowel syndrome)   . Nontraumatic subarachnoid hemorrhage (HCC)   . SAH (subarachnoid hemorrhage) (HCC)   . Urinary retention     Patient Active Problem List   Diagnosis Date Noted  . Sepsis due to pneumonia (HCC) 04/04/2020  . Hemiplegia as late effect of cerebrovascular accident (CVA) (HCC) 09/19/2017  . Fever 09/06/2017  . Slow transit constipation 06/08/2017  . Insomnia 06/08/2017  . Generalized weakness 06/07/2017  . Aspiration pneumonia of both lower lobes due to gastric secretions (HCC)   . Demand ischemia (HCC)   . Chronic pain of right knee   . Chronic heel pain, right   . Small bowel obstruction (HCC)   . SVT (supraventricular tachycardia) (HCC)   . AKI (acute kidney  injury) (HCC) 05/06/2017  . Hypernatremia 05/06/2017  . Dyslipidemia associated with type 2 diabetes mellitus (HCC) 04/27/2017  . Gingivitis, acute, plaque induced 03/26/2017  . Hypersecretion of saliva 03/14/2017  . Bruxism (teeth grinding) 03/14/2017  . Lobar pneumonia (HCC)   . Acute on chronic respiratory failure with hypoxia (HCC)   . Pressure injury of skin 02/16/2017  . Foot drop, right foot 10/18/2016  . CVA (cerebrovascular accident) (HCC) 08/30/2016  . GERD without esophagitis 08/30/2016  . Chronic pulmonary embolism (HCC) 07/03/2016  . Weight loss, non-intentional 06/13/2016  . Type II diabetes mellitus with neurological manifestations (HCC) 05/20/2015  . Essential hypertension, benign 05/20/2015  . Status post insertion of percutaneous endoscopic gastrostomy (PEG) tube (HCC) 05/20/2015  . DVT (deep venous thrombosis) (HCC) 04/05/2015  . Protein-calorie malnutrition, severe (HCC) 03/25/2015  . Dysphagia 02/18/2015  . Seizures (HCC) 02/18/2015  . Elevated troponin 02/18/2015  . Urine retention 02/18/2015  . History of ETT   . Subarachnoid hemorrhage [facility])     Past Surgical History:  Procedure Laterality Date  . ABDOMINAL SURGERY    . ANEURYSM COILING    . COLONOSCOPY N/A 02/20/2015   Procedure: COLONOSCOPY;  Surgeon: Iva Boop, MD;  Location: [facility] ENDOSCOPY;  Service: Endoscopy;  Laterality: N/A;  . ESOPHAGOGASTRODUODENOSCOPY (EGD) WITH PROPOFOL N/A 02/02/2015   Procedure: ESOPHAGOGASTRODUODENOSCOPY (EGD) WITH PROPOFOL;  Surgeon: Jimmye Norman, MD;  Location: [facility] ENDOSCOPY;  Service: General;  Laterality: N/A;  .  IR GENERIC HISTORICAL  08/26/2015   IR GASTRIC TUBE PERC CHG W/O IMG GUIDE 08/26/2015 Irish Lack, MD [facility]-INTERV RAD  . IR GENERIC HISTORICAL  09/14/2015   IR REPLC GASTRO/COLONIC TUBE PERCUT W/FLUORO 09/14/2015 Darrell K Allred, PA-C [facility]-INTERV RAD  . IR REPLACE G-TUBE SIMPLE WO FLUORO  06/08/2016  . IR REPLACE G-TUBE SIMPLE WO FLUORO  10/24/2016  . IR REPLACE  G-TUBE SIMPLE WO FLUORO  01/07/2017  . PEG PLACEMENT N/A 02/02/2015   Procedure: PERCUTANEOUS ENDOSCOPIC GASTROSTOMY (PEG) PLACEMENT;  Surgeon: Jimmye Norman, MD;  Location: [facility] ENDOSCOPY;  Service: General;  Laterality: N/A;  . RADIOLOGY WITH ANESTHESIA N/A 01/13/2015   Procedure: RADIOLOGY WITH ANESTHESIA;  Surgeon: Lisbeth Renshaw, MD;  Location: [facility] OR;  Service: Radiology;  Laterality: N/A;  . TRACHEOSTOMY       OB History   No obstetric history on file.     Family History  Problem Relation Age of Onset  . Hypertension Other     Social History   Tobacco Use  . Smoking status: Former Games developer  . Smokeless tobacco: Never Used  Substance Use Topics  . Alcohol use: No  . Drug use: No    Home Medications Prior to Admission medications   Medication Sig Start Date End Date Taking? Authorizing Provider  acetaminophen (TYLENOL) 325 MG tablet Give 2 tablets via G-tube every 6 hours for pain routinely and every 4 hours as needed    [provider]  bethanechol (URECHOLINE) 10 MG tablet Place 10 mg into feeding tube 3 (three) times daily.  09/08/17   [provider]  ENOXAPARIN SODIUM [place] Inject 50 mg into the skin every 12 (twelve) hours.  09/10/17   [provider]  gabapentin (NEURONTIN) 100 MG capsule Give 200 mg Per G-Tube two times daily HOLD IF LETHARGIC 09/08/17   Ellwood Sayers, MD  levETIRAcetam (KEPPRA) 100 MG/ML solution Place 5 mLs (500 mg total) into feeding tube 2 (two) times daily. 02/11/15   Leroy Sea, MD  metoprolol tartrate (LOPRESSOR) 25 MG tablet Place 25 mg into feeding tube 2 (two) times daily. Hold if SBP < 100 apical pulse    [provider]  Nutritional Supplements (FEEDING SUPPLEMENT, JEVITY 1.5 CAL,) LIQD Administer Jevity 1.5 @ 200 ml every 4 hours daily  Flush tube with 30 ml water prior and post administration    [provider]  Nutritional Supplements (NUTRITIONAL SUPPLEMENT PO) Take by mouth. NPO - Nothing  by mouth Diet -  Nothing by mouth texture    [provider]  Nutritional Supplements (PROMOD) LIQD Take 30 mLs by mouth daily. 10/22/17   [provider]  polyethylene glycol (MIRALAX / GLYCOLAX) packet Place 17 g into feeding tube daily.     [provider]  Probiotic Product (PROBIOTIC DAILY PO) Place 1 capsule into feeding tube 2 (two) times daily. 09/08/17   [provider]  ranitidine (ZANTAC) 150 MG tablet Place 150 mg into feeding tube daily.  04/25/17   [provider]  rosuvastatin (CRESTOR) 5 MG tablet Place 5 mg into feeding tube daily.  09/24/17   [provider]  sertraline (ZOLOFT) 100 MG tablet Place 100 mg into feeding tube daily.  07/12/17   [provider]  traZODone (DESYREL) 50 MG tablet Place 50 mg into feeding tube at bedtime.    [provider]  Water For Irrigation, Sterile (STERILE WATER FOR IRRIGATION) 200 cc water flushes via g-tube every 4 hours    [provider]    Allergies    Patient has no known allergies.  Review of Systems   Review of Systems  Unable to perform ROS: Patient nonverbal    Physical Exam Updated Vital Signs BP (!) 108/95   Pulse (!) 132   Temp 97.9 F (36.6 C) (Oral)   Resp (!) 25   LMP  (LMP Unknown)   SpO2 95%   Physical Exam Vitals and nursing note reviewed.  Constitutional:      General: She is not in acute distress.    Appearance: She is well-developed and well-nourished. She is ill-appearing. She is not toxic-appearing.     Comments: Cachectic in appearance, contracted  HENT:     Head: Normocephalic and atraumatic.  Eyes:     Conjunctiva/sclera: Conjunctivae normal.  Cardiovascular:     Rate and Rhythm: Regular rhythm. Tachycardia present.     Pulses: Normal pulses.     Heart sounds: No murmur heard.   Pulmonary:     Effort: Pulmonary effort is normal. No respiratory distress.     Breath sounds: Rhonchi present. No wheezing or rales.      Comments: On nonrebreather mask, sats at 95%, scattered rhonchi in the right upper and lower lobes Chest:     Chest wall: No tenderness.  Abdominal:     Palpations: Abdomen is soft.     Tenderness: There is abdominal tenderness. There is no right CVA tenderness or left CVA tenderness.     Comments: Mild generalized tenderness throughout  Musculoskeletal:        General: No edema.     Cervical back: Neck supple.     Comments: Contracted  Skin:    General: Skin is warm and dry.     Findings: No erythema.  Neurological:     General: No focal deficit present.     Mental Status: She is alert.  Psychiatric:        Mood and Affect: Mood and affect normal.     ED Results / Procedures / Treatments   Labs (all labs ordered are listed, but only abnormal results are displayed) Labs Reviewed  CBC WITH DIFFERENTIAL/PLATELET - Abnormal; Notable for the following components:      Result Value   WBC 14.3 (*)    HCT 47.2 (*)    RDW 15.9 (*)    Neutro Abs 11.2 (*)    All other components within normal limits  COMPREHENSIVE METABOLIC PANEL - Abnormal; Notable for the following components:   Sodium 158 (*)    Potassium 3.2 (*)    Chloride 117 (*)    Glucose, Bld 165 (*)    BUN 37 (*)    Creatinine, Ser 1.06 (*)    Total Protein 8.6 (*)    All other components within normal limits  PROTIME-INR - Abnormal; Notable for the following components:   Prothrombin Time 17.7 (*)    INR 1.5 (*)    All other components within normal limits  LACTIC ACID, PLASMA - Abnormal; Notable for the following components:   Lactic Acid, Venous 2.6 (*)    All other components within normal limits  CULTURE, BLOOD (ROUTINE X 2)  CULTURE, BLOOD (ROUTINE X 2)  RESP PANEL BY RT-PCR (FLU A&B, COVID) ARPGX2  URINE CULTURE  LIPASE, BLOOD  MAGNESIUM  APTT  URINALYSIS, ROUTINE W REFLEX MICROSCOPIC  LACTIC ACID, PLASMA    EKG None  Radiology DG Chest Portable 1 View  Result Date: 04/04/2020 CLINICAL DATA:   Hypoxia. EXAM:  PORTABLE CHEST 1 VIEW COMPARISON:  Radiograph 10/07/2017 FINDINGS: New volume loss in the right hemithorax. Vague right perihilar opacity. Left lung is clear. The heart is normal in size. Aortic tortuosity versus rotation secondary to right lung volume loss. No pneumothorax or large pleural effusion. The bones are subjectively under mineralized. IMPRESSION: New volume loss in the right hemithorax with vague right perihilar opacity, findings suspicious for aspiration. Possibility of mucous plugging is also considered. Electronically Signed   By: Narda Rutherford M.D.   On: 04/04/2020 17:35    Procedures .Critical Care Performed by: Mare Ferrari, PA-C Authorized by: Mare Ferrari, PA-C   Critical care provider statement:    Critical care time (minutes):  50   Critical care was necessary to treat or prevent imminent or life-threatening deterioration of the following conditions:  Sepsis and metabolic crisis   Critical care was time spent personally by me on the following activities:  Discussions with consultants, evaluation of patient's response to treatment, examination of patient, ordering and performing treatments and interventions, ordering and review of laboratory studies, ordering and review of radiographic studies, pulse oximetry, re-evaluation of patient's condition, obtaining history from patient or surrogate and review of old charts     Medications Ordered in ED Medications  lactated ringers infusion ( Intravenous New Bag/Given 04/04/20 1729)  lactated ringers bolus 1,000 mL (1,000 mLs Intravenous New Bag/Given 04/04/20 1845)    And  lactated ringers bolus 500 mL (has no administration in time range)    And  lactated ringers bolus 250 mL (has no administration in time range)  cefTRIAXone (ROCEPHIN) 2 g in sodium chloride 0.9 % 100 mL IVPB (2 g Intravenous New Bag/Given 04/04/20 1738)  azithromycin (ZITHROMAX) 500 mg in sodium chloride 0.9 % 250 mL IVPB (500 mg Intravenous  New Bag/Given 04/04/20 1844)  lactated ringers bolus 1,000 mL (1,000 mLs Intravenous New Bag/Given 04/04/20 1728)    ED Course  I have reviewed the triage vital signs and the nursing notes.  Pertinent labs & imaging results that were available during my care of the patient were reviewed by me and considered in my medical decision making (see chart for details).  Clinical Course as of 04/04/20 Dow Adolph Apr 04, 2020  [id number] 55 year old female who presents to the ER with complaints of reported anorexia x1 week.  Story is limited, unfortunately not able to get a hold of the family or the facility at this time.  On arrival, she is echocardiac with a rate of 155 on arrival, hypoxic with EMS, currently on a nonrebreather with sats at 94%.  She is afebrile.  Blood pressures of 120/91 on arrival.  On exam, can hear rhonchi in the right upper and right lower lobes.  She is fairly contracted, she has very mild generalized abdominal tenderness but nothing focal.  Initiated sepsis work-up, suspect her tachycardia is secondary due to severe dehydration given she has not had anything to eat.  Initiated broad-spectrum antibiotics to treat for CAP.  Fluid resuscitation initiated. [MB]  1803 DG Chest Portable 1 View  IMPRESSION: New volume loss in the right hemithorax with vague right perihilar opacity, findings suspicious for aspiration. Possibility of mucous plugging is also considered.   [MB]  1855 WBC(!): 14.3 [MB]  1855 Lactic Acid, Venous(!!): 2.6 [MB]  1856 Sodium(!): 158 [MB]  1856 Creatinine(!): 1.06 [MB]  1856 Pt weaned down to 5L [place], sats at 96%  [MB]  1924 Spoke with Dr. Robb Matar who will admit the patient  for further evaluation and treatment.  I did get a hold of the patient's Sister Drue FlirtCandy, however she has also not been able to reach her son.  I did leave a callback number for candy in case she does get in contact with him.  Assume the patient is a full code at this time. [MB]  1929 ED EKG Sinus  tach [MB]  1929 This was a shared visit with my supervising physician Dr. Estell HarpinZammit who independently saw and evaluated the patient & provided guidance in evaluation/management/disposition ,in agreement with care  [MB]    Clinical Course User Index [MB] Leone BrandBelaya, Michai Dieppa A, PA-C   MDM Rules/Calculators/A&P                           Final Clinical Impression(s) / ED Diagnoses Final diagnoses:  Sepsis with acute hypoxic respiratory failure without septic shock, due to unspecified organism [facility](HCC)  Aspiration pneumonia, unspecified aspiration pneumonia type, unspecified laterality, unspecified part of lung (HCC)  Hypernatremia    Rx / DC Orders ED Discharge Orders    None       Leone BrandBelaya, Avionna Bower A, PA-C 04/04/20 1930    Bethann BerkshireZammit, Joseph, MD 04/05/20 2305

## 2020-04-04 NOTE — Sepsis Progress Note (Signed)
Notified provider of need to order repeat lactic acid. ° °

## 2020-04-04 NOTE — ED Notes (Signed)
RN aware of vitals

## 2020-04-05 ENCOUNTER — Other Ambulatory Visit: Payer: Self-pay

## 2020-04-05 DIAGNOSIS — J9601 Acute respiratory failure with hypoxia: Secondary | ICD-10-CM

## 2020-04-05 DIAGNOSIS — Z20822 Contact with and (suspected) exposure to covid-19: Secondary | ICD-10-CM | POA: Diagnosis present

## 2020-04-05 DIAGNOSIS — E1169 Type 2 diabetes mellitus with other specified complication: Secondary | ICD-10-CM | POA: Diagnosis not present

## 2020-04-05 DIAGNOSIS — I5021 Acute systolic (congestive) heart failure: Secondary | ICD-10-CM | POA: Diagnosis not present

## 2020-04-05 DIAGNOSIS — G40909 Epilepsy, unspecified, not intractable, without status epilepticus: Secondary | ICD-10-CM | POA: Diagnosis present

## 2020-04-05 DIAGNOSIS — J690 Pneumonitis due to inhalation of food and vomit: Secondary | ICD-10-CM

## 2020-04-05 DIAGNOSIS — E870 Hyperosmolality and hypernatremia: Secondary | ICD-10-CM | POA: Diagnosis present

## 2020-04-05 DIAGNOSIS — E1149 Type 2 diabetes mellitus with other diabetic neurological complication: Secondary | ICD-10-CM | POA: Diagnosis present

## 2020-04-05 DIAGNOSIS — I10 Essential (primary) hypertension: Secondary | ICD-10-CM | POA: Diagnosis not present

## 2020-04-05 DIAGNOSIS — R000 Tachycardia, unspecified: Secondary | ICD-10-CM | POA: Diagnosis not present

## 2020-04-05 DIAGNOSIS — E872 Acidosis: Secondary | ICD-10-CM | POA: Diagnosis present

## 2020-04-05 DIAGNOSIS — Z7189 Other specified counseling: Secondary | ICD-10-CM | POA: Diagnosis not present

## 2020-04-05 DIAGNOSIS — E876 Hypokalemia: Secondary | ICD-10-CM | POA: Diagnosis present

## 2020-04-05 DIAGNOSIS — R569 Unspecified convulsions: Secondary | ICD-10-CM

## 2020-04-05 DIAGNOSIS — E860 Dehydration: Secondary | ICD-10-CM | POA: Diagnosis present

## 2020-04-05 DIAGNOSIS — D6959 Other secondary thrombocytopenia: Secondary | ICD-10-CM | POA: Diagnosis present

## 2020-04-05 DIAGNOSIS — I825Y2 Chronic embolism and thrombosis of unspecified deep veins of left proximal lower extremity: Secondary | ICD-10-CM

## 2020-04-05 DIAGNOSIS — I110 Hypertensive heart disease with heart failure: Secondary | ICD-10-CM | POA: Diagnosis present

## 2020-04-05 DIAGNOSIS — I69359 Hemiplegia and hemiparesis following cerebral infarction affecting unspecified side: Secondary | ICD-10-CM | POA: Diagnosis not present

## 2020-04-05 DIAGNOSIS — I420 Dilated cardiomyopathy: Secondary | ICD-10-CM | POA: Diagnosis present

## 2020-04-05 DIAGNOSIS — R9431 Abnormal electrocardiogram [ECG] [EKG]: Secondary | ICD-10-CM | POA: Diagnosis not present

## 2020-04-05 DIAGNOSIS — J189 Pneumonia, unspecified organism: Secondary | ICD-10-CM | POA: Diagnosis present

## 2020-04-05 DIAGNOSIS — D638 Anemia in other chronic diseases classified elsewhere: Secondary | ICD-10-CM | POA: Diagnosis present

## 2020-04-05 DIAGNOSIS — R6520 Severe sepsis without septic shock: Secondary | ICD-10-CM

## 2020-04-05 DIAGNOSIS — I82409 Acute embolism and thrombosis of unspecified deep veins of unspecified lower extremity: Secondary | ICD-10-CM | POA: Diagnosis not present

## 2020-04-05 DIAGNOSIS — E440 Moderate protein-calorie malnutrition: Secondary | ICD-10-CM

## 2020-04-05 DIAGNOSIS — I2782 Chronic pulmonary embolism: Secondary | ICD-10-CM | POA: Diagnosis present

## 2020-04-05 DIAGNOSIS — R1310 Dysphagia, unspecified: Secondary | ICD-10-CM | POA: Diagnosis not present

## 2020-04-05 DIAGNOSIS — E785 Hyperlipidemia, unspecified: Secondary | ICD-10-CM | POA: Diagnosis present

## 2020-04-05 DIAGNOSIS — I313 Pericardial effusion (noninflammatory): Secondary | ICD-10-CM | POA: Diagnosis present

## 2020-04-05 DIAGNOSIS — A419 Sepsis, unspecified organism: Secondary | ICD-10-CM | POA: Diagnosis present

## 2020-04-05 DIAGNOSIS — I69054 Hemiplegia and hemiparesis following nontraumatic subarachnoid hemorrhage affecting left non-dominant side: Secondary | ICD-10-CM | POA: Diagnosis not present

## 2020-04-05 DIAGNOSIS — Z681 Body mass index (BMI) 19 or less, adult: Secondary | ICD-10-CM | POA: Diagnosis not present

## 2020-04-05 DIAGNOSIS — R627 Adult failure to thrive: Secondary | ICD-10-CM | POA: Diagnosis not present

## 2020-04-05 LAB — CBC WITH DIFFERENTIAL/PLATELET
Abs Immature Granulocytes: 0.09 10*3/uL — ABNORMAL HIGH (ref 0.00–0.07)
Basophils Absolute: 0.1 10*3/uL (ref 0.0–0.1)
Basophils Relative: 0 %
Eosinophils Absolute: 0 10*3/uL (ref 0.0–0.5)
Eosinophils Relative: 0 %
HCT: 36.7 % (ref 36.0–46.0)
Hemoglobin: 10.9 g/dL — ABNORMAL LOW (ref 12.0–15.0)
Immature Granulocytes: 1 %
Lymphocytes Relative: 13 %
Lymphs Abs: 1.9 10*3/uL (ref 0.7–4.0)
MCH: 31.2 pg (ref 26.0–34.0)
MCHC: 29.7 g/dL — ABNORMAL LOW (ref 30.0–36.0)
MCV: 105.2 fL — ABNORMAL HIGH (ref 80.0–100.0)
Monocytes Absolute: 1 10*3/uL (ref 0.1–1.0)
Monocytes Relative: 7 %
Neutro Abs: 10.8 10*3/uL — ABNORMAL HIGH (ref 1.7–7.7)
Neutrophils Relative %: 79 %
Platelets: 111 10*3/uL — ABNORMAL LOW (ref 150–400)
RBC: 3.49 MIL/uL — ABNORMAL LOW (ref 3.87–5.11)
RDW: 16 % — ABNORMAL HIGH (ref 11.5–15.5)
WBC: 13.8 10*3/uL — ABNORMAL HIGH (ref 4.0–10.5)
nRBC: 0 % (ref 0.0–0.2)

## 2020-04-05 LAB — GLUCOSE, CAPILLARY
Glucose-Capillary: 83 mg/dL (ref 70–99)
Glucose-Capillary: 85 mg/dL (ref 70–99)

## 2020-04-05 LAB — COMPREHENSIVE METABOLIC PANEL
ALT: 8 U/L (ref 0–44)
AST: 18 U/L (ref 15–41)
Albumin: 2.8 g/dL — ABNORMAL LOW (ref 3.5–5.0)
Alkaline Phosphatase: 89 U/L (ref 38–126)
Anion gap: 9 (ref 5–15)
BUN: 18 mg/dL (ref 6–20)
CO2: 27 mmol/L (ref 22–32)
Calcium: 8.5 mg/dL — ABNORMAL LOW (ref 8.9–10.3)
Chloride: 114 mmol/L — ABNORMAL HIGH (ref 98–111)
Creatinine, Ser: 0.72 mg/dL (ref 0.44–1.00)
GFR, Estimated: >60 mL/min (ref >60)
Glucose, Bld: 90 mg/dL (ref 70–99)
Potassium: 3.6 mmol/L (ref 3.5–5.1)
Sodium: 150 mmol/L — ABNORMAL HIGH (ref 135–145)
Total Bilirubin: 0.9 mg/dL (ref 0.3–1.2)
Total Protein: 6 g/dL — ABNORMAL LOW (ref 6.5–8.1)

## 2020-04-05 LAB — STREP PNEUMONIAE URINARY ANTIGEN: Strep Pneumo Urinary Antigen: NEGATIVE

## 2020-04-05 LAB — HIV ANTIBODY (ROUTINE TESTING W REFLEX): HIV Screen 4th Generation wRfx: NONREACTIVE

## 2020-04-05 LAB — LACTIC ACID, PLASMA: Lactic Acid, Venous: 3.3 mmol/L (ref 0.5–1.9)

## 2020-04-05 LAB — MRSA PCR SCREENING: MRSA by PCR: NEGATIVE

## 2020-04-05 LAB — HEMOGLOBIN A1C
Hgb A1c MFr Bld: 5.8 % — ABNORMAL HIGH (ref 4.8–5.6)
Mean Plasma Glucose: 119.76 mg/dL

## 2020-04-05 MED ORDER — ENOXAPARIN SODIUM 60 MG/0.6ML ~~LOC~~ SOLN
1.0000 mg/kg | Freq: Two times a day (BID) | SUBCUTANEOUS | Status: DC
  Administered 2020-04-05 – 2020-04-06 (×3): 50 mg via SUBCUTANEOUS
  Filled 2020-04-05 (×4): qty 0.5

## 2020-04-05 MED ORDER — SODIUM CHLORIDE 0.9 % IV SOLN
750.0000 mg | Freq: Two times a day (BID) | INTRAVENOUS | Status: DC
  Administered 2020-04-05 – 2020-04-06 (×4): 750 mg via INTRAVENOUS
  Filled 2020-04-05 (×5): qty 7.5

## 2020-04-05 MED ORDER — ENSURE ENLIVE PO LIQD
237.0000 mL | Freq: Three times a day (TID) | ORAL | Status: DC
  Administered 2020-04-05 – 2020-04-12 (×18): 237 mL via ORAL

## 2020-04-05 NOTE — Progress Notes (Signed)
NUTRITION NOTE  Secure chat message received from SLP that patient is now on FLD and plan to try solid foods tomorrow (3/9).  Will order Ensure Enlive TID (each supplement provides 350 kcal and 20 grams protein) and Magic Cup TID (each supplement provides 290 kcal and 9 grams protein).       Trenton Gammon, MS, RD, LDN, CNSC Inpatient Clinical Dietitian RD pager # available in [facility]  After hours/weekend pager # available in [facility]

## 2020-04-05 NOTE — TOC Initial Note (Signed)
Transition of Care [facility]) - Initial/Assessment Note    Patient Details  Name: Rachel Vang MRN: [id number] Date of Birth: 08-10-1965  Transition of Care [facility]) CM/SW Contact:    Golda Acre, RN Phone Number: 04/05/2020, 7:50 AM  Clinical Narrative:                  55 y.o. female with medical history significant of pulmonary embolism, DVT, type 2 diabetes mellitus, epilepsy, GERD, hyperlipidemia, hypertension, nontraumatic subarachnoid hemorrhage, IBS, dysphagia who is being sent to the emergency department from her facility due to the patient not eating or drinking much in the past week.  The patient has been having dysphagia and may have had aspirated.  She is unable to provide further history, but is awake and alert and able to single yes or no to simple questions.  She denies headache, back, chest or abdominal pain at this time.  ED Course: Initial vital signs were temperature 97.9 F, pulse 155, respirations 87, BP 120/91 mmHg O2 sat 88% on 5 LPM.  The patient received 2750 mL of LR bolus, ceftriaxone and azithromycin in the emergency department.  I added magnesium sulfate 1 g IVPB due to prolonged QT and 2 boluses of 1000 mL of LR.  Labwork: Her urinalysis was positive for nitrates showed trace leukocyte esterase and few bacteria on microscopic examination.  CBC showed a white count of 14.3, hemoglobin 14.4 g/dL platelets 765.  PT 17.7 INR 1.5 and PTT 34.  Lactic acid was 2.6 and 6.7 then 5.8 mmol/L.  Sodium 158, potassium 3.2, chloride 117 and CO2 29 mmol/L.  Glucose 165, BUN 37 and creatinine 1.06 mg/dL.  Hepatic functions are normal except for a total protein of 8.6 g/dL.  Lipase was 21.  Coronavirus by PCR was negative.  Imaging: Portable 1 view chest radiograph showed new volume loss in the right hemithorax with a right perihilar opacity suspicious for aspiration.  Possibility of mucous plugging is also considered. PLAN: to return to home with self care. Expected Discharge  Plan: Home/Self Care Barriers to Discharge: Continued Medical Work up   Patient Goals and CMS Choice Patient states their goals for this hospitalization and ongoing recovery are:: to go hoime and be well CMS Medicare.gov Compare Post Acute Care list provided to:: Patient Choice offered to / list presented to : Patient  Expected Discharge Plan and Services Expected Discharge Plan: Home/Self Care   Discharge Planning Services: CM Consult Post Acute Care Choice:  (room 230) Living arrangements for the past 2 months: Boarding House                                      Prior Living Arrangements/Services Living arrangements for the past 2 months: [employer] Lives with:: Self Patient language and need for interpreter reviewed:: Yes Do you feel safe going back to the place where you live?: Yes      Need for Family Participation in Patient Care: No (Comment) Care giver support system in place?: No (comment)   Criminal Activity/Legal Involvement Pertinent to Current Situation/Hospitalization: No - Comment as needed  Activities of Daily Living Home Assistive Devices/Equipment: Hospital bed,Eyeglasses ADL Screening (condition at time of admission) Patient's cognitive ability adequate to safely complete daily activities?: No Is the patient deaf or have difficulty hearing?: No Does the patient have difficulty seeing, even when wearing glasses/contacts?: No Does the patient have difficulty concentrating, remembering,  or making decisions?: Yes Patient able to express need for assistance with ADLs?: No (nonverbal) Does the patient have difficulty dressing or bathing?: Yes Independently performs ADLs?: No Communication: Dependent Is this a change from baseline?: Pre-admission baseline Dressing (OT): Needs assistance Is this a change from baseline?: Pre-admission baseline Grooming: Needs assistance Is this a change from baseline?: Pre-admission baseline Feeding: Needs  assistance Is this a change from baseline?: Pre-admission baseline Bathing: Needs assistance Is this a change from baseline?: Pre-admission baseline Toileting: Needs assistance Is this a change from baseline?: Pre-admission baseline In/Out Bed: Needs assistance Is this a change from baseline?: Pre-admission baseline Walks in Home: Needs assistance Is this a change from baseline?: Pre-admission baseline Does the patient have difficulty walking or climbing stairs?: Yes Weakness of Legs: Both Weakness of Arms/Hands: Both  Permission Sought/Granted                  Emotional Assessment Appearance:: Appears stated age Attitude/Demeanor/Rapport: Engaged Affect (typically observed): Calm Orientation: : Oriented to Self,Oriented to Place,Oriented to  Time,Oriented to Situation Alcohol / Substance Use: Not Applicable Psych Involvement: No (comment)  Admission diagnosis:  Hypernatremia [E87.0] Sepsis due to pneumonia (HCC) [J18.9, A41.9] Aspiration pneumonia, unspecified aspiration pneumonia type, unspecified laterality, unspecified part of lung (HCC) [J69.0] Sepsis with acute hypoxic respiratory failure without septic shock, due to unspecified organism (HCC) [A41.9, R65.20, J96.01] Patient Active Problem List   Diagnosis Date Noted  . Sepsis due to pneumonia (HCC) 04/04/2020  . Hypokalemia 04/04/2020  . Prolonged QT interval 04/04/2020  . Hemiplegia as late effect of cerebrovascular accident (CVA) (HCC) 09/19/2017  . Fever 09/06/2017  . Slow transit constipation 06/08/2017  . Insomnia 06/08/2017  . Generalized weakness 06/07/2017  . Aspiration pneumonia of both lower lobes due to gastric secretions (HCC)   . Demand ischemia (HCC)   . Chronic pain of right knee   . Chronic heel pain, right   . Small bowel obstruction (HCC)   . SVT (supraventricular tachycardia) (HCC)   . AKI (acute kidney injury) (HCC) 05/06/2017  . Hypernatremia 05/06/2017  . Dyslipidemia associated with  type 2 diabetes mellitus (HCC) 04/27/2017  . Gingivitis, acute, plaque induced 03/26/2017  . Hypersecretion of saliva 03/14/2017  . Bruxism (teeth grinding) 03/14/2017  . Lobar pneumonia (HCC)   . Acute on chronic respiratory failure with hypoxia (HCC)   . Pressure injury of skin 02/16/2017  . Foot drop, right foot 10/18/2016  . CVA (cerebrovascular accident) (HCC) 08/30/2016  . GERD without esophagitis 08/30/2016  . Chronic pulmonary embolism (HCC) 07/03/2016  . Weight loss, non-intentional 06/13/2016  . Type II diabetes mellitus with neurological manifestations (HCC) 05/20/2015  . Essential hypertension, benign 05/20/2015  . Status post insertion of percutaneous endoscopic gastrostomy (PEG) tube (HCC) 05/20/2015  . DVT (deep venous thrombosis) (HCC) 04/05/2015  . Protein-calorie malnutrition, severe (HCC) 03/25/2015  . Dysphagia 02/18/2015  . Seizures (HCC) 02/18/2015  . Elevated troponin 02/18/2015  . Urine retention 02/18/2015  . History of ETT   . Subarachnoid hemorrhage (HCC)    PCP:  Patient, No Pcp Per Pharmacy:   [facility] [place], [place] - [address] [place] [address] Phone: [phone] Fax: [phone]     Social Determinants of Health (SDOH) Interventions    Readmission Risk Interventions No flowsheet data found.

## 2020-04-05 NOTE — Evaluation (Signed)
Clinical/Bedside Swallow Evaluation Patient Details  Name: Rachel Vang MRN: [id number] Date of Birth: 08/03/1965  Today's Date: 04/05/2020 Time: SLP Start Time (ACUTE ONLY): 1330 SLP Stop Time (ACUTE ONLY): 1415 SLP Time Calculation (min) (ACUTE ONLY): 45 min  Past Medical History:  Past Medical History:  Diagnosis Date  . Acute pulmonary embolism (HCC) 02/19/2015  . Acute respiratory failure (HCC)   . Diabetes mellitus without complication (HCC)    Type 2, W/o complications  . DVT (deep venous thrombosis) (HCC) 04/05/2015  . Dysphagia   . Epilepsy (HCC)   . GERD (gastroesophageal reflux disease)   . Hyperlipidemia   . Hypertension   . IBS (irritable bowel syndrome)   . Nontraumatic subarachnoid hemorrhage (HCC)   . SAH (subarachnoid hemorrhage) (HCC)   . Urinary retention    Past Surgical History:  Past Surgical History:  Procedure Laterality Date  . ABDOMINAL SURGERY    . ANEURYSM COILING    . COLONOSCOPY N/A 02/20/2015   Procedure: COLONOSCOPY;  Surgeon: Iva Boop, MD;  Location: [facility] ENDOSCOPY;  Service: Endoscopy;  Laterality: N/A;  . ESOPHAGOGASTRODUODENOSCOPY (EGD) WITH PROPOFOL N/A 02/02/2015   Procedure: ESOPHAGOGASTRODUODENOSCOPY (EGD) WITH PROPOFOL;  Surgeon: Jimmye Norman, MD;  Location: [facility] ENDOSCOPY;  Service: General;  Laterality: N/A;  . IR GENERIC HISTORICAL  08/26/2015   IR GASTRIC TUBE PERC CHG W/O IMG GUIDE 08/26/2015 Irish Lack, MD [facility]-INTERV RAD  . IR GENERIC HISTORICAL  09/14/2015   IR REPLC GASTRO/COLONIC TUBE PERCUT W/FLUORO 09/14/2015 Darrell K Allred, PA-C [facility]-INTERV RAD  . IR REPLACE G-TUBE SIMPLE WO FLUORO  06/08/2016  . IR REPLACE G-TUBE SIMPLE WO FLUORO  10/24/2016  . IR REPLACE G-TUBE SIMPLE WO FLUORO  01/07/2017  . PEG PLACEMENT N/A 02/02/2015   Procedure: PERCUTANEOUS ENDOSCOPIC GASTROSTOMY (PEG) PLACEMENT;  Surgeon: Jimmye Norman, MD;  Location: [facility] ENDOSCOPY;  Service: General;  Laterality: N/A;  . RADIOLOGY WITH ANESTHESIA N/A 01/13/2015    Procedure: RADIOLOGY WITH ANESTHESIA;  Surgeon: Lisbeth Renshaw, MD;  Location: [facility] OR;  Service: Radiology;  Laterality: N/A;  . TRACHEOSTOMY     HPI:  55 yo female adm to [facility] with concern for asp pna - ? mucous plugging.  Pt with PMH + for GERD, PE=DVT, HLD, HTN, ICH s/p PEG/trach in 2017, IBS, DM2.  Pt underwent PEG placement 01/2015.   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  Oral motor exam revealed oral apraxia preventing pt from protruding tongue with movement along labia on command.  She orally holds boluses and takes very small boluses of pudding, graham cracker, Ensure and water.  Right anterior labial spillage of liquids/secretions observed due to decreased facial nerve function.  *Pt was lying on her right side, thus spillage to the right.   Secretions and boluses that pt did not swallow were suctioned by SlP - and later by pt *placed oral suction in her hand.   Increased inefficiency of swallowing noted with puree and solids.  Pt reports she is not eating because she doesn't like "baby food", however her level of dysphagia limits her ability to meet nutritional needs.  Advised she consume as much liquid nutrition as possible.   Will follow up with trials of solids and to determine indication for instrumental swallow evaluation.  Contacted RD to help maximize nutrition w/i diet restriction.     Informed pt of recommendations and provided her written precautions.  RN, MD informed  of recommendations and orders obtained. Thanks.  Attempted to call son to gather premorbid information, neither phone number for Sheria Lang listed in the chart reached him. SLP Visit Diagnosis: Dysphagia, oral phase (R13.11)    Aspiration Risk  Moderate aspiration risk;Risk for inadequate nutrition/hydration    Diet Recommendation Thin liquid (full liquids)   Liquid Administration via:  Straw Medication Administration: Crushed with puree Supervision: Staff to assist with self feeding;Full supervision/cueing for compensatory strategies Compensations: Slow rate;Small sips/bites (oral suction after meals or po - to assure adequate clearance - pt orallly holds) Postural Changes: Remain upright for at least 30 minutes after po intake;Seated upright at 90 degrees    Other  Recommendations Oral Care Recommendations: Oral care QID   Follow up Recommendations   TBD - SNF     Frequency and Duration min 1 x/week  2 weeks       Prognosis Prognosis for Safe Diet Advancement: Fair Barriers to Reach Goals: Severity of deficits      Swallow Study   General Date of Onset: 04/05/20 HPI: 55 yo female adm to [facility] with concern for asp pna - ? mucous plugging.  Pt with PMH + for GERD, PE=DVT, HLD, HTN, ICH s/p PEG/trach in 2017, IBS, DM2.  Pt underwent PEG placement 01/2015. Previous Swallow Assessment: MBS 03/2015 recommended liquids Diet Prior to this Study: NPO Temperature Spikes Noted: No Respiratory Status: Room air History of Recent Intubation: No Behavior/Cognition: Alert;Cooperative Oral Cavity Assessment: Within Functional Limits Oral Cavity - Dentition: Other (Comment);Poor condition Vision: Functional for self-feeding Self-Feeding Abilities: Needs assist Patient Positioning:  (as upright side lying) Baseline Vocal Quality: Low vocal intensity Volitional Cough: Weak Volitional Swallow: Unable to elicit    Oral/Motor/Sensory Function     Ice Chips Ice chips: Not tested   Thin Liquid Thin Liquid: Impaired Presentation: Straw;Self Fed Oral Phase Functional Implications: Other (comment);Oral holding;Right anterior spillage;Right lateral sulci pocketing (clinically suspect oral holding) Other Comments: clinically appears consistent with motor planning difficulties resulting in oral holding    Nectar Thick Nectar Thick Liquid: Impaired Presentation: Self Fed;Straw Oral  Phase Impairments: Other (comment);Reduced lingual movement/coordination Oral phase functional implications: Oral holding;Prolonged oral transit Other Comments: clinically appears consistent with motor planning difficulties resulting in oral holding   Honey Thick Honey Thick Liquid: Not tested   Puree Puree: Impaired Presentation: Spoon;Self Fed Oral Phase Impairments: Reduced labial seal;Reduced lingual movement/coordination Oral Phase Functional Implications: Oral residue;Oral holding;Prolonged oral transit Pharyngeal Phase Impairments: Suspected delayed Swallow Other Comments: clinically appears consistent with motor planning difficulties resulting in oral holding   Solid     Solid: Impaired Presentation: Self Fed Oral Phase Impairments: Reduced lingual movement/coordination;Impaired mastication Oral Phase Functional Implications: Impaired mastication;Oral residue;Prolonged oral transit Other Comments: oral retention present - with decreased ability to transit to clear      Chales Abrahams 04/05/2020,3:15 PM   Rolena Infante, MS [facility] SLP Acute Rehab Services Office [phone] Pager [phone]

## 2020-04-05 NOTE — Progress Notes (Signed)
[facility]  PROGRESS NOTE  Rachel Vang WNU:[id number] DOB: 05-12-1965 DOA: 04/04/2020 PCP: Patient, No Pcp Per   Brief HPI:   55 year old female with medical history of pulmonary embolism, DVT, diabetes mellitus type 2, epilepsy, GERD, hyperlipidemia, hypertension, nontraumatic subarachnoid hemorrhage, IBS, dysphagia who was sent to the ED for not eating and drinking for past 1 week.  Patient has been having dysphagia and may have aspirated. In the ED she was found to have sodium 158, potassium 3.2, COVID-19 was negative.  Chest x-ray showed new volume loss in the right hemithorax with right perihilar opacity suspicious for aspiration.  Possibility of mucous plugging also considered.   Subjective   Patient seen and examined, she is alert, communicating, following commands.  Denies any pain.  She has been n.p.o.   Assessment/Plan:     1. Sepsis due to pneumonia-secondary to aspiration pneumonia from dysphagia due to CVA.  Patient is currently NPO.  Follow strep pneumo urinary antigen.  Follow Gram stain culture and sensitivity, follow-up blood culture and sensitivity.  Started on Unasyn per pharmacy. 2. Hypernatremia- continue to poor oral intake.  Sodium was 158 on admission, has improved to 150.  Continue 0.45% sodium chloride at 150 mill per hour.  Follow serum sodium level in a.m. 3. Dysphagia-likely secondary to previous CVA.  Will obtain swallow evaluation to assess patient'Vang dysphagia.  Based on swallow evaluation, patient might need PEG tube placement for her nutritional needs. 4. History of seizure disorder-continue Keppra IV 750 mg every 12 hours. 5. Hypertension-blood pressure stable.  6. Dyslipidemia-rosuvastatin at hold for now. 7. Diabetes mellitus type 2-check CBG every 6 hours.  Based on small evaluation, patient might need G-tube placement. 8. History of DVT/chronic pulmonary embolism-continue Lovenox therapeutic dose.     COVID-19 Labs  No results for  input(Vang): DDIMER, FERRITIN, LDH, CRP in the last 72 hours.  Lab Results  Component Value Date   SARSCOV2NAA NEGATIVE 04/04/2020     Scheduled medications:   . Chlorhexidine Gluconate Cloth  6 each Topical Daily  . enoxaparin (LOVENOX) injection  1 mg/kg Subcutaneous Q12H  . mouth rinse  15 mL Mouth Rinse BID         CBG: Recent Labs  Lab 04/04/20 2347  GLUCAP 122*    SpO2: 96 % O2 Flow Rate (L/min): 2 L/min    CBC: Recent Labs  Lab 04/04/20 1725 04/05/20 1012  WBC 14.3* 13.8*  NEUTROABS 11.2* 10.8*  HGB 14.4 10.9*  HCT 47.2* 36.7  MCV 99.8 105.2*  PLT 214 111*    Basic Metabolic Panel: Recent Labs  Lab 04/04/20 1725 04/05/20 1012  NA 158* 150*  K 3.2* 3.6  CL 117* 114*  CO2 29 27  GLUCOSE 165* 90  BUN 37* 18  CREATININE 1.06* 0.72  CALCIUM 9.9 8.5*  MG 2.3  --      Liver Function Tests: Recent Labs  Lab 04/04/20 1725 04/05/20 1012  AST 17 18  ALT 12 8  ALKPHOS 95 89  BILITOT 0.9 0.9  PROT 8.6* 6.0*  ALBUMIN 3.9 2.8*     Antibiotics: Anti-infectives (From admission, onward)   Start     Dose/Rate Route Frequency Ordered Stop   04/05/20 0400  Ampicillin-Sulbactam (UNASYN) 3 g in sodium chloride 0.9 % 100 mL IVPB        3 g 200 mL/hr over 30 Minutes Intravenous Every 8 hours 04/04/20 2047     04/04/20 1700  cefTRIAXone (ROCEPHIN) 2 g in sodium chloride 0.9 %  100 mL IVPB  Status:  Discontinued        2 g 200 mL/hr over 30 Minutes Intravenous Every 24 hours 04/04/20 1657 04/04/20 2026   04/04/20 1700  azithromycin (ZITHROMAX) 500 mg in sodium chloride 0.9 % 250 mL IVPB  Status:  Discontinued        500 mg 250 mL/hr over 60 Minutes Intravenous Every 24 hours 04/04/20 1657 04/04/20 2026       DVT prophylaxis: Lovenox  Code Status: Full code  Family Communication: No family at bedside     Objective   Vitals:   04/05/20 0900 04/05/20 1000 04/05/20 1100 04/05/20 1200  BP: 110/74 (!) 88/67 98/66   Pulse: (!) 102 97 94   Resp:   18 18   Temp:    98.2 F (36.8 C)  TempSrc:    Oral  SpO2: 92% 97% 96%   Weight:      Height:        Intake/Output Summary (Last 24 hours) at 04/05/2020 1237 Last data filed at 04/05/2020 1000 Gross per 24 hour  Intake 7580.22 ml  Output 1550 ml  Net 6030.22 ml    03/06 1901 - 03/08 0700 In: 7125.3 [I.V.:2321.1] Out: 1100 [Urine:1100]  Filed Weights   04/04/20 2348  Weight: 50.8 kg    Physical Examination:    General-appears in no acute distress  Heart-S1-S2, regular, no murmur auscultated  Lungs-clear to auscultation bilaterally, no wheezing or crackles auscultated  Abdomen-soft, nontender, no organomegaly  Extremities-no edema in the lower extremities  Neuro-alert, oriented x3, right-sided hemiparesis   Status is: Inpatient  Dispo: The patient is from: Skilled nursing facility              Anticipated d/c is to: Skilled nursing facility              Anticipated d/c date is: 04/07/2020              Patient currently not stable for discharge  Barrier to discharge-aspiration pneumonia, dysphagia evaluation        Data Reviewed:   Recent Results (from the past 240 hour(Vang))  Resp Panel by RT-PCR (Flu A&B, Covid) Nasopharyngeal Swab     Status: None   Collection Time: 04/04/20  5:25 PM   Specimen: Nasopharyngeal Swab; Nasopharyngeal(NP) swabs in vial transport medium  Result Value Ref Range Status   SARS Coronavirus 2 by RT PCR NEGATIVE NEGATIVE Final    Comment: (NOTE) SARS-CoV-2 target nucleic acids are NOT DETECTED.  The SARS-CoV-2 RNA is generally detectable in upper respiratory specimens during the acute phase of infection. The lowest concentration of SARS-CoV-2 viral copies this assay can detect is 138 copies/mL. A negative result does not preclude SARS-Cov-2 infection and should not be used as the sole basis for treatment or other patient management decisions. A negative result may occur with  improper specimen collection/handling, submission  of specimen other than nasopharyngeal swab, presence of viral mutation(Vang) within the areas targeted by this assay, and inadequate number of viral copies(<138 copies/mL). A negative result must be combined with clinical observations, patient history, and epidemiological information. The expected result is Negative.  Fact Sheet for Patients:  BloggerCourse.com  Fact Sheet for Healthcare Providers:  SeriousBroker.it  This test is no t yet approved or cleared by the [place] FDA and  has been authorized for detection and/or diagnosis of SARS-CoV-2 by FDA under an Emergency Use Authorization (EUA). This EUA will remain  in effect (meaning this test can  be used) for the duration of the COVID-19 declaration under Section 564(b)(1) of the Act, 21 U.[place].C.section 360bbb-3(b)(1), unless the authorization is terminated  or revoked sooner.       Influenza A by PCR NEGATIVE NEGATIVE Final   Influenza B by PCR NEGATIVE NEGATIVE Final    Comment: (NOTE) The Xpert Xpress SARS-CoV-2/FLU/RSV plus assay is intended as an aid in the diagnosis of influenza from Nasopharyngeal swab specimens and should not be used as a sole basis for treatment. Nasal washings and aspirates are unacceptable for Xpert Xpress SARS-CoV-2/FLU/RSV testing.  Fact Sheet for Patients: BloggerCourse.com  Fact Sheet for Healthcare Providers: SeriousBroker.it  This test is not yet approved or cleared by the [place] FDA and has been authorized for detection and/or diagnosis of SARS-CoV-2 by FDA under an Emergency Use Authorization (EUA). This EUA will remain in effect (meaning this test can be used) for the duration of the COVID-19 declaration under Section 564(b)(1) of the Act, 21 [place].C. section 360bbb-3(b)(1), unless the authorization is terminated or revoked.  Performed at [facility], [address], [place], [place] [address]   MRSA PCR Screening     Status: None   Collection Time: 04/04/20 11:20 PM   Specimen: Nasopharyngeal  Result Value Ref Range Status   MRSA by PCR NEGATIVE NEGATIVE Final    Comment:        The GeneXpert MRSA Assay (FDA approved for NASAL specimens only), is one component of a comprehensive MRSA colonization surveillance program. It is not intended to diagnose MRSA infection nor to guide or monitor treatment for MRSA infections. Performed at [facility], [address], [place], [place] [address]     Recent Labs  Lab 04/04/20 1725  LIPASE 21   No results for input(Vang): AMMONIA in the last 168 hours.  Cardiac Enzymes: No results for input(Vang): CKTOTAL, CKMB, CKMBINDEX, TROPONINI in the last 168 hours. BNP (last 3 results) No results for input(Vang): BNP in the last 8760 hours.  ProBNP (last 3 results) No results for input(Vang): PROBNP in the last 8760 hours.  Studies:  DG Chest Portable 1 View  Result Date: 04/04/2020 CLINICAL DATA:  Hypoxia. EXAM: PORTABLE CHEST 1 VIEW COMPARISON:  Radiograph 10/07/2017 FINDINGS: New volume loss in the right hemithorax. Vague right perihilar opacity. Left lung is clear. The heart is normal in size. Aortic tortuosity versus rotation secondary to right lung volume loss. No pneumothorax or large pleural effusion. The bones are subjectively under mineralized. IMPRESSION: New volume loss in the right hemithorax with vague right perihilar opacity, findings suspicious for aspiration. Possibility of mucous plugging is also considered. Electronically Signed   By: Narda Rutherford M.D.   On: 04/04/2020 17:35       Rachel Vang Rachel Vang   [facility] If 7PM-7AM, please contact night-coverage at [email or web address], Office  [phone]   04/05/2020, 12:37 PM  LOS: 0 days

## 2020-04-05 NOTE — Progress Notes (Signed)
Initial Nutrition Assessment  DOCUMENTATION CODES:   Non-severe (moderate) malnutrition in context of chronic illness,Underweight  INTERVENTION:  - when/if G-tube placed, recommend Osmolite 1.2 @ 20 ml/hr to advance by 10 ml every 8 hours to reach goal rate of 60 ml/hr with 45 ml Prosource TF (or equivalent) once/day and 150 ml free water every 4 hours. - at goal rate, this regimen will provide 1768 kcal, 91 grams protein, and 2081 ml free water.   Monitor magnesium, potassium, and phosphorus daily for at least 3 days, MD to replete as needed, as pt is at risk for refeeding syndrome given moderate malnutrition and unclear nutrition status and intakes PTA.   NUTRITION DIAGNOSIS:   Moderate Malnutrition related to chronic illness,dysphagia as evidenced by mild fat depletion,moderate muscle depletion.  GOAL:   Patient will meet greater than or equal to 90% of their needs  MONITOR:   Labs,Weight trends  REASON FOR ASSESSMENT:   Malnutrition Screening Tool,Consult Assessment of nutrition requirement/status  ASSESSMENT:   55 y.o. female with medical history of pulmonary embolism, DVT, type 2 DM, epilepsy, GERD, HLD, HTN, non-traumatic subarachnoid hemorrhage, IBS, and dysphagia. She was sent to the ED from facility due to not eating or drinking much x1 week and concern for aspiration.  She has been NPO since admission. Patient laying in bed with no family or visitors present. Patient is noted to be a/o to self and place.   She is able to provide some limited amount of information but all responses are mumbled and not always easy to decipher.   Patient denies abdominal pain or nausea but indicates she is hungry. She confirms that she resides at a facility and indicates that she does require feeding assistance from staff at the facility. She eats in her room. She is able to get around but it was unclear if she is able to ambulate or if she is bed or chair bound.   LDA avatar indicates  she had a G-tube placed on 03/16/19 and that it was removed early this AM; unsure if it was truly removed today or not.  Consult to RD indicates patient may require G-tube placement this admission.   Weight yesterday was 112 lb and PTA the most recently documented weight was on 02/07/18 at [facility] when she weighed 124 lb. This indicates 12 lb weight loss (10% body weight).   Labs reviewed; Na: 158 mmol/l, K: 3.2 mmol/l, Cl: 117 mmol/l, BUN: 37 mg/dl, creatinine: 8.25 mg/dl. Medications reviewed; 1 g IV Mg sulfate x1 run 3/7, 10 mEq IV KCl x4 runs 3/7. IVF; 1/2 NS @ 150 ml/hr.     NUTRITION - FOCUSED PHYSICAL EXAM:  Flowsheet Row Most Recent Value  Orbital Region Mild depletion  Upper Arm Region Moderate depletion  Thoracic and Lumbar Region Unable to assess  Buccal Region Mild depletion  Temple Region Moderate depletion  Clavicle Bone Region Moderate depletion  Clavicle and Acromion Bone Region Moderate depletion  Scapular Bone Region Mild depletion  Dorsal Hand Moderate depletion  Patellar Region Unable to assess  Anterior Thigh Region Unable to assess  Posterior Calf Region Unable to assess  Edema (RD Assessment) Unable to assess  Hair Reviewed  Eyes Reviewed  Mouth Reviewed  [poor dentition]  Skin Reviewed  Nails Reviewed       Diet Order:   Diet Order            Diet NPO time specified  Diet effective now  EDUCATION NEEDS:   Not appropriate for education at this time  Skin:  Skin Assessment: Reviewed RN Assessment  Last BM:  3/8 (type 6 x2)  Height:   Ht Readings from Last 1 Encounters:  04/04/20 5\' 6"  (1.676 m)    Weight:   Wt Readings from Last 1 Encounters:  04/04/20 50.8 kg     Estimated Nutritional Needs:  Kcal:  06/04/20 kcal Protein:  85-100 grams Fluid:  >/= 2.3 L/day     [phone], MS, RD, LDN, CNSC Inpatient Clinical Dietitian RD pager # available in AMION  After hours/weekend pager # available in  [facility]

## 2020-04-05 NOTE — Plan of Care (Signed)
Admitted to room 1225 for sepsis related to pneumonia. Patient able to answer most questions in limited fashion, but appears oriented at this time.    Problem: Education: Goal: Knowledge of General Education information will improve Description: Including pain rating scale, medication(s)/side effects and non-pharmacologic comfort measures 04/05/2020 0012 by Lamona Curl, RN Outcome: Progressing 04/05/2020 0012 by Lamona Curl, RN Outcome: Progressing   Problem: Health Behavior/Discharge Planning: Goal: Ability to manage health-related needs will improve 04/05/2020 0012 by Lamona Curl, RN Outcome: Progressing 04/05/2020 0012 by Lamona Curl, RN Outcome: Progressing   Problem: Clinical Measurements: Goal: Ability to maintain clinical measurements within normal limits will improve 04/05/2020 0012 by Lamona Curl, RN Outcome: Progressing 04/05/2020 0012 by Lamona Curl, RN Outcome: Progressing Goal: Will remain free from infection 04/05/2020 0012 by Lamona Curl, RN Outcome: Progressing 04/05/2020 0012 by Lamona Curl, RN Outcome: Progressing Goal: Diagnostic test results will improve 04/05/2020 0012 by Lamona Curl, RN Outcome: Progressing 04/05/2020 0012 by Lamona Curl, RN Outcome: Progressing Goal: Respiratory complications will improve 04/05/2020 0012 by Lamona Curl, RN Outcome: Progressing 04/05/2020 0012 by Lamona Curl, RN Outcome: Progressing Goal: Cardiovascular complication will be avoided 04/05/2020 0012 by Lamona Curl, RN Outcome: Progressing 04/05/2020 0012 by Lamona Curl, RN Outcome: Progressing   Problem: Activity: Goal: Risk for activity intolerance will decrease 04/05/2020 0012 by Lamona Curl, RN Outcome: Progressing 04/05/2020 0012 by Lamona Curl, RN Outcome: Progressing   Problem: Nutrition: Goal: Adequate nutrition will be maintained 04/05/2020 0012 by Lamona Curl, RN Outcome: Progressing 04/05/2020 0012  by Lamona Curl, RN Outcome: Progressing   Problem: Coping: Goal: Level of anxiety will decrease 04/05/2020 0012 by Lamona Curl, RN Outcome: Progressing 04/05/2020 0012 by Lamona Curl, RN Outcome: Progressing   Problem: Elimination: Goal: Will not experience complications related to bowel motility 04/05/2020 0012 by Lamona Curl, RN Outcome: Progressing 04/05/2020 0012 by Lamona Curl, RN Outcome: Progressing Goal: Will not experience complications related to urinary retention 04/05/2020 0012 by Lamona Curl, RN Outcome: Progressing 04/05/2020 0012 by Lamona Curl, RN Outcome: Progressing   Problem: Pain Managment: Goal: General experience of comfort will improve 04/05/2020 0012 by Lamona Curl, RN Outcome: Progressing 04/05/2020 0012 by Lamona Curl, RN Outcome: Progressing   Problem: Safety: Goal: Ability to remain free from injury will improve 04/05/2020 0012 by Lamona Curl, RN Outcome: Progressing 04/05/2020 0012 by Lamona Curl, RN Outcome: Progressing   Problem: Skin Integrity: Goal: Risk for impaired skin integrity will decrease 04/05/2020 0012 by Lamona Curl, RN Outcome: Progressing 04/05/2020 0012 by Lamona Curl, RN Outcome: Progressing

## 2020-04-05 NOTE — Progress Notes (Signed)
ANTICOAGULATION CONSULT NOTE - Initial Consult  Pharmacy Consult for enoxaparin Indication: pulmonary embolus  No Known Allergies  Patient Measurements: Height: 5\' 6"  (167.6 cm) Weight: 50.8 kg (111 lb 15.9 oz) IBW/kg (Calculated) : 59.3   Vital Signs: Temp: 99.1 F (37.3 C) (03/07 2348) Temp Source: Oral (03/07 2348) BP: 93/40 (03/08 0100) Pulse Rate: 117 (03/08 0100)  Labs: Recent Labs    04/04/20 1725  HGB 14.4  HCT 47.2*  PLT 214  APTT 34  LABPROT 17.7*  INR 1.5*  CREATININE 1.06*    Estimated Creatinine Clearance: 47.5 mL/min (A) (by C-G formula based on SCr of 1.06 mg/dL (H)).   Medical History: Past Medical History:  Diagnosis Date  . Acute pulmonary embolism (HCC) 02/19/2015  . Acute respiratory failure (HCC)   . Diabetes mellitus without complication (HCC)    Type 2, W/o complications  . DVT (deep venous thrombosis) (HCC) 04/05/2015  . Dysphagia   . Epilepsy (HCC)   . GERD (gastroesophageal reflux disease)   . Hyperlipidemia   . Hypertension   . IBS (irritable bowel syndrome)   . Nontraumatic subarachnoid hemorrhage (HCC)   . SAH (subarachnoid hemorrhage) (HCC)   . Urinary retention       Assessment:  55 y.o. female with medical history significant of pulmonary embolism, DVT, type 2 diabetes mellitus, epilepsy, GERD, hyperlipidemia, hypertension, nontraumatic subarachnoid hemorrhage, IBS, dysphagia who is being sent to the ED from her facility due to the patient not eating or drinking much in the past week.  Pt is currently NPO due to dysphagia and aspiration.  Pharmacy consulted to dose enoxaparin for PE, LD of xarelto 20mg  was on 3/6 @ 2100  Goal of Therapy:  Anti-Xa level 0.6-1 units/ml 4hrs after LMWH dose given Monitor platelets by anticoagulation protocol:   Plan:  Enoxaparin 1mg /kg (50mg ) SQ q12h Follow renal function  RPh 04/05/2020, 1:45 AM

## 2020-04-06 DIAGNOSIS — Z7189 Other specified counseling: Secondary | ICD-10-CM

## 2020-04-06 DIAGNOSIS — Z515 Encounter for palliative care: Secondary | ICD-10-CM

## 2020-04-06 LAB — COMPREHENSIVE METABOLIC PANEL
ALT: 11 U/L (ref 0–44)
AST: 19 U/L (ref 15–41)
Albumin: 2.9 g/dL — ABNORMAL LOW (ref 3.5–5.0)
Alkaline Phosphatase: 106 U/L (ref 38–126)
Anion gap: 12 (ref 5–15)
BUN: 10 mg/dL (ref 6–20)
CO2: 27 mmol/L (ref 22–32)
Calcium: 8.5 mg/dL — ABNORMAL LOW (ref 8.9–10.3)
Chloride: 105 mmol/L (ref 98–111)
Creatinine, Ser: 0.54 mg/dL (ref 0.44–1.00)
GFR, Estimated: >60 mL/min (ref >60)
Glucose, Bld: 82 mg/dL (ref 70–99)
Potassium: 2.7 mmol/L — CL (ref 3.5–5.1)
Sodium: 144 mmol/L (ref 135–145)
Total Bilirubin: 0.8 mg/dL (ref 0.3–1.2)
Total Protein: 6.5 g/dL (ref 6.5–8.1)

## 2020-04-06 LAB — GLUCOSE, CAPILLARY
Glucose-Capillary: 86 mg/dL (ref 70–99)
Glucose-Capillary: 150 mg/dL — ABNORMAL HIGH (ref 70–99)
Glucose-Capillary: 94 mg/dL (ref 70–99)
Glucose-Capillary: 118 mg/dL — ABNORMAL HIGH (ref 70–99)

## 2020-04-06 LAB — URINE CULTURE

## 2020-04-06 LAB — CBC
HCT: 35 % — ABNORMAL LOW (ref 36.0–46.0)
Hemoglobin: 10.6 g/dL — ABNORMAL LOW (ref 12.0–15.0)
MCH: 30.6 pg (ref 26.0–34.0)
MCHC: 30.3 g/dL (ref 30.0–36.0)
MCV: 101.2 fL — ABNORMAL HIGH (ref 80.0–100.0)
Platelets: 115 10*3/uL — ABNORMAL LOW (ref 150–400)
RBC: 3.46 MIL/uL — ABNORMAL LOW (ref 3.87–5.11)
RDW: 15.4 % (ref 11.5–15.5)
WBC: 15.5 10*3/uL — ABNORMAL HIGH (ref 4.0–10.5)
nRBC: 0 % (ref 0.0–0.2)

## 2020-04-06 LAB — MAGNESIUM: Magnesium: 1.7 mg/dL (ref 1.7–2.4)

## 2020-04-06 MED ORDER — BETHANECHOL CHLORIDE 10 MG PO TABS
10.0000 mg | ORAL_TABLET | Freq: Three times a day (TID) | ORAL | Status: DC
  Administered 2020-04-06 – 2020-04-14 (×22): 10 mg via ORAL
  Filled 2020-04-06 (×26): qty 1

## 2020-04-06 MED ORDER — DOCUSATE SODIUM 100 MG PO CAPS
100.0000 mg | ORAL_CAPSULE | Freq: Every day | ORAL | Status: DC
  Administered 2020-04-07: 100 mg via ORAL
  Filled 2020-04-06: qty 1

## 2020-04-06 MED ORDER — GABAPENTIN 100 MG PO CAPS
200.0000 mg | ORAL_CAPSULE | Freq: Two times a day (BID) | ORAL | Status: DC
  Administered 2020-04-06 – 2020-04-07 (×3): 200 mg via ORAL
  Filled 2020-04-06 (×4): qty 2

## 2020-04-06 MED ORDER — LEVETIRACETAM 500 MG PO TABS
750.0000 mg | ORAL_TABLET | Freq: Two times a day (BID) | ORAL | Status: DC
  Administered 2020-04-06 – 2020-04-07 (×3): 750 mg via ORAL
  Filled 2020-04-06 (×5): qty 1

## 2020-04-06 MED ORDER — POLYETHYLENE GLYCOL 3350 17 G PO PACK
17.0000 g | PACK | Freq: Every day | ORAL | Status: DC
  Administered 2020-04-07: 17 g via ORAL
  Filled 2020-04-06: qty 1

## 2020-04-06 MED ORDER — ROSUVASTATIN CALCIUM 5 MG PO TABS
5.0000 mg | ORAL_TABLET | Freq: Every day | ORAL | Status: DC
  Administered 2020-04-06 – 2020-04-14 (×9): 5 mg via ORAL
  Filled 2020-04-06 (×9): qty 1

## 2020-04-06 MED ORDER — MORPHINE SULFATE (PF) 2 MG/ML IV SOLN
2.0000 mg | INTRAVENOUS | Status: DC | PRN
  Administered 2020-04-06 – 2020-04-12 (×8): 2 mg via INTRAVENOUS
  Filled 2020-04-06 (×8): qty 1

## 2020-04-06 MED ORDER — POTASSIUM CHLORIDE 10 MEQ/100ML IV SOLN
10.0000 meq | INTRAVENOUS | Status: AC
  Administered 2020-04-06 (×4): 10 meq via INTRAVENOUS
  Filled 2020-04-06 (×4): qty 100

## 2020-04-06 MED ORDER — MAGNESIUM SULFATE 2 GM/50ML IV SOLN
2.0000 g | Freq: Once | INTRAVENOUS | Status: AC
  Administered 2020-04-06: 2 g via INTRAVENOUS
  Filled 2020-04-06: qty 50

## 2020-04-06 MED ORDER — POTASSIUM CHLORIDE 20 MEQ PO PACK
40.0000 meq | PACK | Freq: Once | ORAL | Status: AC
  Administered 2020-04-06: 40 meq via ORAL
  Filled 2020-04-06: qty 2

## 2020-04-06 MED ORDER — RIVAROXABAN 20 MG PO TABS
20.0000 mg | ORAL_TABLET | Freq: Every day | ORAL | Status: DC
  Administered 2020-04-06 – 2020-04-10 (×5): 20 mg via ORAL
  Filled 2020-04-06 (×5): qty 1

## 2020-04-06 MED ORDER — SODIUM CHLORIDE 0.45 % IV SOLN: INTRAVENOUS | Status: DC

## 2020-04-06 NOTE — Progress Notes (Signed)
PROGRESS NOTE   Rachel Vang  MWN:[id number]    DOB: 03-12-1965    DOA: 04/04/2020  PCP: Patient, No Pcp Per   I have briefly reviewed patients previous medical [facility] in [facility]  Chief Complaint  Patient presents with  . Failure To Thrive    Brief Narrative:  55 year old female, medical history including but not limited to pulmonary embolism, DVT, type II DM, epilepsy, GERD, HLD, HTN, nontraumatic subarachnoid hemorrhage, left hemiparesis, IBS, dysphagia,?  Nursing home resident, nonambulatory/wheelchair mobile, presented to the ED due to not eating or drinking for past 1 week PTA.  Admitted for sepsis due to aspiration pneumonia from pre-existing dysphagia, dehydration with hypernatremia.  Clinically improved.  Transferring from stepdown to telemetry today and possible discharge 3/10.   Assessment & Plan:  Principal Problem:   Sepsis due to pneumonia [facility]) Active Problems:   Seizures ([place])   DVT (deep venous thrombosis) (HCC)   Type II diabetes mellitus with neurological manifestations ([place])   Essential hypertension, benign   Chronic pulmonary embolism (HCC)   Dyslipidemia associated with type 2 diabetes mellitus (HCC)   Hypernatremia   Hemiplegia as late effect of cerebrovascular accident (CVA) ([place])   Hypokalemia   Prolonged QT interval   Malnutrition of moderate degree   Aspiration pneumonia (HCC)   Sepsis, POA, secondary to aspiration pneumonia from pre-existing dysphagia:  Met sepsis criteria on admission including fever of 100.3 F, tachypnea in the 20s-occasionally low 30s, tachycardic and hypoxic at 89% on 10 L/min oxygen/NRB.  Neutrophilic leukocytosis/WBC 14.3 on admission.  Lactate peaked to 6.7.  Flu panel and COVID-19 PCR negative.  Urine streptococcal antigen negative.  Blood cultures negative to date.  MRSA PCR negative.  Urine microscopy not suggestive of UTI and culture showed multiple species.  HIV screen nonreactive.  S/p IV azithromycin  x1 dose and ceftriaxone x1 dose on 3/7.  Continue IV Unasyn 3/7 >for additional 24 hours and then consider transitioning to oral Augmentin to complete total 7 days course.  Clinically improving although had low-grade fever of 100.5 last night and mild leukocytosis of 15.5.  Recommend follow-up of chest x-ray in 4 weeks.  Dysphagia  Chronic.?  Related to prior subarachnoid hemorrhage.  Not sure if she had a CVA.  Speech therapy input appreciated and recommend thin liquids.  I had a long discussion with patient today and she declines PEG tube placement after explaining risk versus benefit.  Encouraged better oral intake.  Dehydration with hypernatremia  Secondary to poor oral intake.  Presented with serum sodium of 158.  Treated with hypotonic/half-normal saline.  Serum sodium down to 144, continue reduced/gentle half-normal saline and follow BMP  Hypokalemia  Potassium 2.7 on 3/9.  Replace aggressively and follow.  Magnesium 1.7, will replace 1 g IV and follow.  Lactic acidosis:  Secondary to sepsis and acute respiratory failure.  Lactate peaked to 6.7.  Treated per sepsis protocol with aggressive IV fluids and improved.  Acute respiratory failure with hypoxia  Secondary to aspiration pneumonia  Resolved.  Seizure disorder  Transition from IV to oral Keppra and monitor.  Essential hypertension  Mildly uncontrolled at times  Dyslipidemia  Resume home rosuvastatin.  Type II DM  Well-controlled.  History of DVT/PE  On Xarelto PTA, placed on full dose Lovenox here, will request pharmacy to switch back to Xarelto.  Prolonged QTC  Continue to replace potassium and magnesium and follow EKG.  Minimize QT prolonging medicines.  Adult failure to thrive  Likely major component  being poor oral intake related to dysphagia.  Consulted palliative care medicine for [place].  Anemia of chronic disease  Stable.  Thrombocytopenia  Possibly related to sepsis.   Stable.  Follow CBC.  Body mass index is 18.08 kg/m.  Nutritional Status Nutrition Problem: Moderate Malnutrition Etiology: chronic illness,dysphagia Signs/Symptoms: mild fat depletion,moderate muscle depletion Interventions: Refer to RD note for recommendations    DVT prophylaxis:   Full dose Lovenox   Code Status: Full Code Family Communication: None at bedside Disposition:  Status is: Inpatient  Remains inpatient appropriate because:Inpatient level of care appropriate due to severity of illness   Dispo: The patient is from: Home              Anticipated d/c is to: Home              Patient currently is not medically stable to d/c.   Difficult to place patient No        Consultants:   None  Procedures:   None  Antimicrobials:    Anti-infectives (From admission, onward)   Start     Dose/Rate Route Frequency Ordered Stop   04/05/20 0400  Ampicillin-Sulbactam (UNASYN) 3 g in sodium chloride 0.9 % 100 mL IVPB        3 g 200 mL/hr over 30 Minutes Intravenous Every 8 hours 04/04/20 2047     04/04/20 1700  cefTRIAXone (ROCEPHIN) 2 g in sodium chloride 0.9 % 100 mL IVPB  Status:  Discontinued        2 g 200 mL/hr over 30 Minutes Intravenous Every 24 hours 04/04/20 1657 04/04/20 2026   04/04/20 1700  azithromycin (ZITHROMAX) 500 mg in sodium chloride 0.9 % 250 mL IVPB  Status:  Discontinued        500 mg 250 mL/hr over 60 Minutes Intravenous Every 24 hours 04/04/20 1657 04/04/20 2026        Subjective:  Patient seen this morning.  Reports long-term history of poor oral intake, lacks appetite and taste.  States that she eats "baby food".  Also reports undetermined amount of weight loss. Denies complaints this morning.  Denied dyspnea, cough or pain.  States that she has left-sided weakness from before, usually bedbound but moves around with assistance in a wheelchair.  Objective:   Vitals:   04/06/20 1100 04/06/20 1200 04/06/20 1300 04/06/20 1400  BP: 114/82  103/71 (!) 122/93 (!) 131/103  Pulse: (!) 122 (!) 117 (!) 111 (!) 121  Resp: _0 Temp:  98 F (36.7 C)    TempSrc:  Oral    SpO2: 97% 97% 97% 98%  Weight:      Height:        General exam: Young female, small built and frail, lying comfortably propped up in bed without distress.  Oral mucosa with borderline hydration. Respiratory system: Diminished breath sounds in the bases with occasional basal crackles but otherwise clear to auscultation.  No increased work of breathing. Cardiovascular system: S1 & S2 heard, regular and mildly tachycardic.Marland Kitchen No JVD, murmurs, rubs, gallops or clicks. No pedal edema.  Telemetry personally reviewed: Sinus tachycardia in the 100s. Gastrointestinal system: Abdomen is nondistended, soft and nontender. No organomegaly or masses felt. Normal bowel sounds heard. Central nervous system: Alert and oriented x2. No focal neurological deficits. Extremities: Grade 5 x 5 power in right upper extremity, 4+ by 5 left hand grip, lower extremities with suspected contractures at the knees. Skin: No rashes, lesions or ulcers Psychiatry: Judgement and  insight appear somewhat impaired. Mood & affect appropriate.     Data Reviewed:   I have personally reviewed following labs and imaging studies   CBC: Recent Labs  Lab 04/04/20 1725 04/05/20 1012 04/06/20 0256  WBC 14.3* 13.8* 15.5*  NEUTROABS 11.2* 10.8*  --   HGB 14.4 10.9* 10.6*  HCT 47.2* 36.7 35.0*  MCV 99.8 105.2* 101.2*  PLT 214 111* 115*    Basic Metabolic Panel: Recent Labs  Lab 04/04/20 1725 04/05/20 1012 04/06/20 0256  NA 158* 150* 144  K 3.2* 3.6 2.7*  CL 117* 114* 105  CO2 _0 GLUCOSE 165* 90 82  BUN 37* 18 10  CREATININE 1.06* 0.72 0.54  CALCIUM 9.9 8.5* 8.5*  MG 2.3  --  1.7    Liver Function Tests: Recent Labs  Lab 04/04/20 1725 04/05/20 1012 04/06/20 0256  AST _1 ALT _2 ALKPHOS 95 89 106  BILITOT 0.9 0.9 0.8  PROT 8.6* 6.0* 6.5  ALBUMIN 3.9 2.8*  2.9*    CBG: Recent Labs  Lab 04/05/20 2311 04/06/20 0544 04/06/20 1145  GLUCAP 85 86 150*    Microbiology Studies:   Recent Results (from the past 240 hour(s))  Blood culture (routine x 2)     Status: None (Preliminary result)   Collection Time: 04/04/20  5:25 PM   Specimen: BLOOD RIGHT FOREARM  Result Value Ref Range Status   Specimen Description   Final    BLOOD RIGHT FOREARM Performed at [facility] [address] [place], [place] [address]    Special Requests   Final    BACTERIAL CASTS Blood Culture adequate volume Performed at [facility] [address] [place], [place] [address]    Culture   Final    NO GROWTH 2 DAYS Performed at [facility] [address], [place], [place] [address]    Report Status PENDING  Incomplete  Blood culture (routine x 2)     Status: None (Preliminary result)   Collection Time: 04/04/20  5:25 PM   Specimen: BLOOD LEFT FOREARM  Result Value Ref Range Status   Specimen Description   Final    BLOOD LEFT FOREARM Performed at [facility] [address], [place], [place] [address]    Special Requests   Final    BLOOD Blood Culture results may not be optimal due to an inadequate volume of blood received in culture bottles Performed at [facility] [address], [place], [place] [address]    Culture   Final    NO GROWTH 2 DAYS Performed at [facility] [address], [place], [place] [address]    Report Status PENDING  Incomplete  Resp Panel by RT-PCR (Flu A&B, Covid) Nasopharyngeal Swab     Status: None   Collection Time: 04/04/20  5:25 PM   Specimen: Nasopharyngeal Swab; Nasopharyngeal(NP) swabs in vial transport medium  Result Value Ref Range Status   SARS Coronavirus 2 by RT PCR NEGATIVE NEGATIVE Final    Comment: (NOTE) SARS-CoV-2 target nucleic acids are NOT DETECTED.  The SARS-CoV-2 RNA is generally detectable in upper  respiratory specimens during the acute phase of infection. The lowest concentration of SARS-CoV-2 viral copies this assay can detect is 138 copies/mL. A negative result does not preclude SARS-Cov-2 infection and should not be used as the sole basis for treatment or other patient management decisions. A negative result may  occur with  improper specimen collection/handling, submission of specimen other than nasopharyngeal swab, presence of viral mutation(s) within the areas targeted by this assay, and inadequate number of viral copies(<138 copies/mL). A negative result must be combined with clinical observations, patient history, and epidemiological information. The expected result is Negative.  Fact Sheet for Patients:  EntrepreneurPulse.com.au  Fact Sheet for Healthcare Providers:  IncredibleEmployment.be  This test is no t yet approved or cleared by the [place] FDA and  has been authorized for detection and/or diagnosis of SARS-CoV-2 by FDA under an Emergency Use Authorization (EUA). This EUA will remain  in effect (meaning this test can be used) for the duration of the COVID-19 declaration under Section 564(b)(1) of the Act, 21 U.S.C.section 360bbb-3(b)(1), unless the authorization is terminated  or revoked sooner.       Influenza A by PCR NEGATIVE NEGATIVE Final   Influenza B by PCR NEGATIVE NEGATIVE Final    Comment: (NOTE) The Xpert Xpress SARS-CoV-2/FLU/RSV plus assay is intended as an aid in the diagnosis of influenza from Nasopharyngeal swab specimens and should not be used as a sole basis for treatment. Nasal washings and aspirates are unacceptable for Xpert Xpress SARS-CoV-2/FLU/RSV testing.  Fact Sheet for Patients: EntrepreneurPulse.com.au  Fact Sheet for Healthcare Providers: IncredibleEmployment.be  This test is not yet approved or cleared by the [place] FDA and has been  authorized for detection and/or diagnosis of SARS-CoV-2 by FDA under an Emergency Use Authorization (EUA). This EUA will remain in effect (meaning this test can be used) for the duration of the COVID-19 declaration under Section 564(b)(1) of the Act, 21 U.S.C. section 360bbb-3(b)(1), unless the authorization is terminated or revoked.  Performed at [facility], [place] [address], [place], [place] [address]   Urine culture     Status: Abnormal   Collection Time: 04/04/20 10:09 PM   Specimen: In/Out Cath Urine  Result Value Ref Range Status   Specimen Description   Final    IN/OUT CATH URINE Performed at [facility] [address], [place], [place] [address]    Special Requests   Final    NONE Performed at [facility], [place] [address] [place], [place] [address]    Culture MULTIPLE SPECIES PRESENT, SUGGEST RECOLLECTION (A)  Final   Report Status 04/06/2020 FINAL  Final  MRSA PCR Screening     Status: None   Collection Time: 04/04/20 11:20 PM   Specimen: Nasopharyngeal  Result Value Ref Range Status   MRSA by PCR NEGATIVE NEGATIVE Final    Comment:        The GeneXpert MRSA Assay (FDA approved for NASAL specimens only), is one component of a comprehensive MRSA colonization surveillance program. It is not intended to diagnose MRSA infection nor to guide or monitor treatment for MRSA infections. Performed at [facility], [place] [address] [place], [place] [address]      Radiology Studies:  DG Chest Portable 1 View  Result Date: 04/04/2020 CLINICAL DATA:  Hypoxia. EXAM: PORTABLE CHEST 1 VIEW COMPARISON:  Radiograph 10/07/2017 FINDINGS: New volume loss in the right hemithorax. Vague right perihilar opacity. Left lung is clear. The heart is normal in size. Aortic tortuosity versus rotation secondary to right lung volume loss. No pneumothorax or large pleural effusion. The bones are subjectively under  mineralized. IMPRESSION: New volume loss in the right hemithorax with vague right perihilar opacity, findings suspicious for aspiration. Possibility of mucous plugging is also considered. Electronically Signed  By: Keith Rake M.D.   On: 04/04/2020 17:35     Scheduled Meds:   . Chlorhexidine Gluconate Cloth  6 each Topical Daily  . enoxaparin (LOVENOX) injection  1 mg/kg Subcutaneous Q12H  . feeding supplement  237 mL Oral TID BM  . mouth rinse  15 mL Mouth Rinse BID    Continuous Infusions:   . sodium chloride 50 mL/hr at 04/06/20 1435  . ampicillin-sulbactam (UNASYN) IV Stopped (04/06/20 1233)  . levETIRAcetam Stopped (04/06/20 1418)     LOS: 1 day     Vernell Leep, MD, [place], [facility]. [facility]    To contact the attending provider between 7A-7P or the covering provider during after hours 7P-7A, please log into the web site [email or web address] and access using universal [place] password for that web site. If you do not have the password, please call the hospital operator.  04/06/2020, 2:53 PM

## 2020-04-06 NOTE — Progress Notes (Signed)
PT Cancellation Note  Patient Details Name: MILCA SYTSMA MRN: [id number] DOB: 11-18-1965   Cancelled Treatment:    Reason Eval/Treat Not Completed: PT screened, no needs identified, will sign off, patient comes from LTC/SNF, per Rn, contractures and requires total care.    Blanchard Kelch Rada Hay PT Acute Rehabilitation Services Pager [phone] Office [phone]  04/06/2020, 3:22 PM

## 2020-04-06 NOTE — Consult Note (Addendum)
Palliative Care Consult Note  Reason for consult: Goals of care in light of dysphagia and poor intake  55 year old female with past medical history of pulmonary embolism, DVT, type 2 diabetes, epilepsy, GERD, hypertension, hyperlipidemia, nontraumatic subarachnoid hemorrhage, left hemiparesis, IBS, dysphagia, who is a long-term resident at [facility] and is nonambulatory at baseline who was sent to the ED after not eating and drinking for over a week.  She was admitted for sepsis due to aspiration pneumonia and has improved with antibiotic therapy.  She was also hyponatremic and dehydrated on admission.  Chart review reveals that she had previously had PEG tube over a year ago but unclear when this was removed or why it was removed.  I met today with Rachel Vang.  She is awake and alert and lying in bed.  She is nonverbal but appropriately answers questions with yes and no nodding and hand gestures.  I introduced palliative care as specialized medical care for people living with serious illness. It focuses on providing relief from the symptoms and stress of a serious illness. The goal is to improve quality of life for both the patient and the family.  She indicates that she has been living long-term a skilled facility and spends her time in bed or wheelchair.  She affirms that she had PEG tube in the past and gestures that it was leaking when I asked why it had been removed.  She is not sure how long ago it was removed.  We talked about artificial nutrition hydration and she is clear that she does not want a PEG tube moving forward.  We discussed concern that she is not going to be able to maintain her nutrition and hydration and that we should reassess her overall goals moving forward.  I asked if somebody helps her make medical decisions and she indicated yes.  She indicated that her son is the person on him she relies to help make medical decisions.  He is the person listed on prior MOST form that  was completed by outpatient palliative care and is on her chart.  I attempted to call him today but we were unable to reach him.  I left a voicemail requesting return call.  Questions and concerns addressed.   PMT will continue to support holistically.  - Rachel Vang is clear that she does not want PEG tube.  She had one in the past and indicates it was removed due to leakage around the tube.  - We had initial discussions regarding concern that she will continue to decline if she is not able to maintain her nutrition and hydration. - She relies on her son to help with decision making.  She would like him to be part of conversation regarding goals of care moving forward.  I called but was unable to reach him today. - I left a voicemail for her son requesting return call. - She will benefit from further conversation regarding goals of care and completion of an up to date MOST form.  We will attempt follow-up for this tomorrow but she would like her son to be part of this conversation.  If she is ready for discharge prior to completion of this, I would recommend follow by outpatient palliative care for this purpose.  In the past, she was seen by [facility] as an outpatient.  If she is ready for discharge, I will call and request that they follow-up with her to reassess goals based upon her clinical course once  she returns to SNF.  Start time: 1800 End time: 1900 Total time: 60  Greater than 50%  of this time was spent counseling and coordinating care related to the above assessment and plan.  Micheline Rough, MD [facility] [phone]

## 2020-04-06 NOTE — Progress Notes (Signed)
  Speech Language Pathology Treatment: Dysphagia  Patient Details Name: ANYE BROSE MRN: [id number] DOB: 11/04/1965 Today's Date: 04/06/2020 Time: 1194-1740 SLP Time Calculation (min) (ACUTE ONLY): 18 min  Assessment / Plan / Recommendation Clinical Impression  Pt continues to present with severe oral dysphagia.  With trials of puree and regular solid graham cracker there was prolonged oral phase, decreased mastication, oral holding, and R sided oral residue.  Pt benefited from verbal cues and introduction of liquid to initiate pharyngeal swallow response.  Pt benefited from liquid was to reduce residuals. Pt exhibited difficulty with oral transit of purees and solids, requiring suction in about 50% of trials. With solid graham cracker pt allowed cracker to dissolve more than masticated, but was eventually able to clear in 1 of 2 trials.  Pt did not exhibit any clinical s/s of aspiration and appears to be protecting airway during pharyngeal phase of swallow.  MBSS in 2017 reflective relatively safe pharyngeal swallow, but moderate-severe oral deficits.  Recommend continuing full liquid diet.  Staff may give puree or soft solid snacks with 1:1 direct supervision for pleasure, with aspiration precautions as noted below.    HPI HPI: 55 yo female adm to [facility] with concern for asp pna - ? mucous plugging.  Pt with PMH + for GERD, PE=DVT, HLD, HTN, ICH s/p PEG/trach in 2017, IBS, DM2.  ICH 12/2014.  Pt underwent PEG placement 01/2015.  MBS March of 2017 revealed "a moderate-severe oral dysphagia with mild pharyngeal impairments," transient penetration of NTL and recommendations for liquid diet with trials of puree      SLP Plan  Continue with current plan of care       Recommendations  Diet recommendations: Thin liquid Liquids provided via: Cup;Straw Medication Administration: Crushed with puree Supervision: Staff to assist with self feeding;Full supervision/cueing for compensatory  strategies Compensations: Slow rate;Small sips/bites;Follow solids with liquid Postural Changes and/or Swallow Maneuvers: Seated upright 90 degrees                Oral Care Recommendations: Oral care QID Follow up Recommendations: Skilled Nursing facility SLP Visit Diagnosis: Dysphagia, oral phase (R13.11) Plan: Continue with current plan of care       GO                Kerrie Pleasure, MA, CCC-SLP Acute Rehabilitation Services Office: [phone]  04/06/2020, 10:37 AM

## 2020-04-07 LAB — BASIC METABOLIC PANEL
Anion gap: 10 (ref 5–15)
Anion gap: 10 (ref 5–15)
BUN: 6 mg/dL (ref 6–20)
BUN: <5 mg/dL — ABNORMAL LOW (ref 6–20)
CO2: 26 mmol/L (ref 22–32)
CO2: 28 mmol/L (ref 22–32)
Calcium: 8 mg/dL — ABNORMAL LOW (ref 8.9–10.3)
Calcium: 8.5 mg/dL — ABNORMAL LOW (ref 8.9–10.3)
Chloride: 101 mmol/L (ref 98–111)
Chloride: 103 mmol/L (ref 98–111)
Creatinine, Ser: 0.53 mg/dL (ref 0.44–1.00)
Creatinine, Ser: 0.54 mg/dL (ref 0.44–1.00)
GFR, Estimated: >60 mL/min (ref >60)
GFR, Estimated: >60 mL/min (ref >60)
Glucose, Bld: 101 mg/dL — ABNORMAL HIGH (ref 70–99)
Glucose, Bld: 85 mg/dL (ref 70–99)
Potassium: 2.9 mmol/L — ABNORMAL LOW (ref 3.5–5.1)
Potassium: 3.5 mmol/L (ref 3.5–5.1)
Sodium: 137 mmol/L (ref 135–145)
Sodium: 141 mmol/L (ref 135–145)

## 2020-04-07 LAB — CBC
HCT: 32.6 % — ABNORMAL LOW (ref 36.0–46.0)
Hemoglobin: 10.3 g/dL — ABNORMAL LOW (ref 12.0–15.0)
MCH: 30.8 pg (ref 26.0–34.0)
MCHC: 31.6 g/dL (ref 30.0–36.0)
MCV: 97.6 fL (ref 80.0–100.0)
Platelets: 123 10*3/uL — ABNORMAL LOW (ref 150–400)
RBC: 3.34 MIL/uL — ABNORMAL LOW (ref 3.87–5.11)
RDW: 14.8 % (ref 11.5–15.5)
WBC: 12.7 10*3/uL — ABNORMAL HIGH (ref 4.0–10.5)
nRBC: 0 % (ref 0.0–0.2)

## 2020-04-07 LAB — MAGNESIUM: Magnesium: 2.1 mg/dL (ref 1.7–2.4)

## 2020-04-07 LAB — GLUCOSE, CAPILLARY
Glucose-Capillary: 91 mg/dL (ref 70–99)
Glucose-Capillary: 81 mg/dL (ref 70–99)
Glucose-Capillary: 99 mg/dL (ref 70–99)
Glucose-Capillary: 119 mg/dL — ABNORMAL HIGH (ref 70–99)

## 2020-04-07 MED ORDER — LIP MEDEX EX OINT
TOPICAL_OINTMENT | CUTANEOUS | Status: AC
  Filled 2020-04-07: qty 7

## 2020-04-07 MED ORDER — AMOXICILLIN-POT CLAVULANATE 875-125 MG PO TABS
1.0000 | ORAL_TABLET | Freq: Two times a day (BID) | ORAL | Status: DC
  Administered 2020-04-07: 1 via ORAL
  Filled 2020-04-07 (×2): qty 1

## 2020-04-07 MED ORDER — POTASSIUM CHLORIDE CRYS ER 20 MEQ PO TBCR
40.0000 meq | EXTENDED_RELEASE_TABLET | Freq: Once | ORAL | Status: AC
  Administered 2020-04-07: 40 meq via ORAL
  Filled 2020-04-07: qty 2

## 2020-04-07 MED ORDER — POTASSIUM CHLORIDE 10 MEQ/100ML IV SOLN
10.0000 meq | INTRAVENOUS | Status: AC
  Administered 2020-04-07 (×4): 10 meq via INTRAVENOUS
  Filled 2020-04-07 (×4): qty 100

## 2020-04-07 NOTE — Progress Notes (Signed)
Patient arrived on the unit at 71.  Vitals taken and tele monitor placed.  Call bell within reach and bed alarm turned on.  Patient resting comfortably in the bed.

## 2020-04-07 NOTE — Progress Notes (Signed)
PROGRESS NOTE   Rachel Vang  EAV:[id number]    DOB: 22-Apr-1965    DOA: 04/04/2020  PCP: Patient, No Pcp Per   I have briefly reviewed patients previous medical records in [facility].  Chief Complaint  Patient presents with  . Failure To Thrive    Brief Narrative:  55 year old female, medical history including but not limited to pulmonary embolism, DVT, type II DM, epilepsy, GERD, HLD, HTN, nontraumatic subarachnoid hemorrhage, left hemiparesis, IBS, dysphagia,?  Nursing home resident, nonambulatory/wheelchair mobile, presented to the ED due to not eating or drinking for past 1 week PTA.  Admitted for sepsis due to aspiration pneumonia from pre-existing dysphagia, dehydration with hypernatremia.  Clinically improved.  Transferring to medical bed 3/10 and hopeful discharge back to her LTC SNF [facility] on 04/08/2020.   Assessment & Plan:  Principal Problem:   Sepsis due to pneumonia [facility]) Active Problems:   Seizures ([place])   DVT (deep venous thrombosis) (HCC)   Type II diabetes mellitus with neurological manifestations ([place])   Essential hypertension, benign   Chronic pulmonary embolism (HCC)   Dyslipidemia associated with type 2 diabetes mellitus (HCC)   Hypernatremia   Hemiplegia as late effect of cerebrovascular accident (CVA) ([place])   Hypokalemia   Prolonged QT interval   Malnutrition of moderate degree   Aspiration pneumonia (HCC)   Sepsis, POA, secondary to aspiration pneumonia from pre-existing dysphagia:  Met sepsis criteria on admission including fever of 100.3 F, tachypnea in the 20s-occasionally low 30s, tachycardic and hypoxic at 89% on 10 L/min oxygen/NRB.  Neutrophilic leukocytosis/WBC 14.3 on admission.  Lactate peaked to 6.7.  Flu panel and COVID-19 PCR negative.  Urine streptococcal antigen negative.  Blood cultures remain negative today.  MRSA PCR negative.  Urine microscopy not suggestive of UTI and culture showed multiple species.  HIV screen  nonreactive.  S/p IV azithromycin x1 dose and ceftriaxone x1 dose on 3/7.  Then treated with IV Unasyn 3/7 >.  Has completed approximately 3 days of IV antibiotics.  Although intermittent low-grade fevers, she looks good, not septic or toxic looking, tachycardia and tachypnea have resolved, leukocytosis improved, cultures remain negative.  Discontinued IV antibiotics and started Augmentin x5 days.    Recommend follow-up of chest x-ray in 4 weeks.  Monitor overnight and if no further acute issues, DC back to LTC [facility] 3/11.  Dysphagia  Chronic.?  Related to prior subarachnoid hemorrhage.  Not sure if she had a CVA.  Speech therapy follow-up appreciated and have advance diet to dysphagia 2 diet and thin liquids with which hopefully her oral intake will improve.  Dr. Domingo Cocking, palliative care MD and I had separate and detailed conversation with patient regarding PEG tube which she has repeatedly declined.  She reportedly had a PEG tube in the past which had to be removed due to leakage around the tube.  Dehydration with hypernatremia  Secondary to poor oral intake.  Presented with serum sodium of 158.  Treated with hypotonic/half-normal saline.  Resolved.  IVF discontinued.  Will be at risk of recurrent dehydration if has inconsistent oral intake.  Hypokalemia  Replacing aggressively.  Improved.  Follow BMP in a.m.  Magnesium up to 2.1.  Lactic acidosis:  Secondary to sepsis and acute respiratory failure.  Lactate peaked to 6.7.  Treated per sepsis protocol with aggressive IV fluids and improved.  Acute respiratory failure with hypoxia  Secondary to aspiration pneumonia  Resolved.  Seizure disorder  Continue prior home dose of Keppra.  Essential  hypertension  Mildly uncontrolled at times  Dyslipidemia  Continue home rosuvastatin.  Type II DM  Well-controlled.  History of DVT/PE  Briefly on full dose Lovenox which has been switched back to her home dose of  Xarelto.  Prolonged QTC  Improved, QTC by EKG 3/10: 495 ms.  Continue to replace low potassium.  Avoid QT prolonging medications.  Adult failure to thrive  Likely major component being poor oral intake related to dysphagia.  Palliative care MD input appreciated.  Attempting to reach son to participate in [place] conversations.  If unable to do this while hospitalized, outpatient palliative care follow-up upon discharge.  Anemia of chronic disease  Stable.  Thrombocytopenia  Possibly related to sepsis.  Improving  Diarrhea  Reported today by patient and RN.  May be related to sepsis or antibiotics.  Low index of suspicion for C. difficile at this time.  Monitor closely.  May consider as needed Imodium.  Body mass index is 18.08 kg/m.  Nutritional Status Nutrition Problem: Moderate Malnutrition Etiology: chronic illness,dysphagia Signs/Symptoms: mild fat depletion,moderate muscle depletion Interventions: Refer to RD note for recommendations    DVT prophylaxis:   Full dose Lovenox   Code Status: Full Code Family Communication: I discussed in detail with patient's son via phone, updated care and answered all questions Disposition:  Status is: Inpatient  Remains inpatient appropriate because:Inpatient level of care appropriate due to severity of illness   Dispo: The patient is from: LTC SNF at [facility]              Anticipated d/c is to: Return to LTC SNF at [facility], possibly 04/08/2020.              Patient currently is not medically stable to d/c.   Difficult to place patient No        Consultants:   Palliative care medicine  Procedures:   None  Antimicrobials:    Anti-infectives (From admission, onward)   Start     Dose/Rate Route Frequency Ordered Stop   04/07/20 2200  amoxicillin-clavulanate (AUGMENTIN) 875-125 MG per tablet 1 tablet        1 tablet Oral Every 12 hours 04/07/20 1417 04/12/20 2159   04/05/20 0400  Ampicillin-Sulbactam  (UNASYN) 3 g in sodium chloride 0.9 % 100 mL IVPB  Status:  Discontinued        3 g 200 mL/hr over 30 Minutes Intravenous Every 8 hours 04/04/20 2047 04/07/20 1417   04/04/20 1700  cefTRIAXone (ROCEPHIN) 2 g in sodium chloride 0.9 % 100 mL IVPB  Status:  Discontinued        2 g 200 mL/hr over 30 Minutes Intravenous Every 24 hours 04/04/20 1657 04/04/20 2026   04/04/20 1700  azithromycin (ZITHROMAX) 500 mg in sodium chloride 0.9 % 250 mL IVPB  Status:  Discontinued        500 mg 250 mL/hr over 60 Minutes Intravenous Every 24 hours 04/04/20 1657 04/04/20 2026        Subjective:  Patient interviewed and examined along with her RN in room.  Overall feels better.  No fever or dyspnea.  Mild intermittent nonproductive cough.  Had a couple episodes of loose stools without abdominal pain.  Objective:   Vitals:   04/07/20 1100 04/07/20 1200 04/07/20 1300 04/07/20 1400  BP: (!) 128/93 (!) 121/102 (!) 125/97 118/88  Pulse: 95 (!) 111 (!) 109 (!) 122  Resp: 17 18 12 16   Temp:  99 F (37.2 C)  TempSrc:  Axillary    SpO2: 98% 98% 97% 98%  Weight:      Height:        General exam: Young female, small built and frail, lying comfortably propped up in bed without distress.  Oral mucosa moist. Respiratory system: Clear to auscultation.  No increased work of breathing. Cardiovascular system: S1 and S2 heard, RRR.  No JVD, murmurs or pedal edema.  Telemetry personally reviewed: SR.  Occasional ventricular bigeminy/trigeminy. Gastrointestinal system: Abdomen is nondistended, soft and nontender.  No organomegaly or masses appreciated.  Normal bowel sounds heard. Central nervous system: Alert and oriented x2. No focal neurological deficits. Extremities: Grade 5 x 5 power in right upper extremity, 4+ by 5 left hand grip.  Bilateral lower extremity with significant contractures. Skin: No rashes, lesions or ulcers Psychiatry: Judgement and insight appear somewhat impaired. Mood & affect pleasant and  appropriate.     Data Reviewed:   I have personally reviewed following labs and imaging studies   CBC: Recent Labs  Lab 04/04/20 1725 04/05/20 1012 04/06/20 0256 04/07/20 0430  WBC 14.3* 13.8* 15.5* 12.7*  NEUTROABS 11.2* 10.8*  --   --   HGB 14.4 10.9* 10.6* 10.3*  HCT 47.2* 36.7 35.0* 32.6*  MCV 99.8 105.2* 101.2* 97.6  PLT 214 111* 115* 123*    Basic Metabolic Panel: Recent Labs  Lab 04/04/20 1725 04/05/20 1012 04/06/20 0256 04/07/20 0430 04/07/20 1133  NA 158*   < > 144 137 141  K 3.2*   < > 2.7* 2.9* 3.5  CL 117*   < > 105 101 103  CO2 29   < > 27 26 28   GLUCOSE 165*   < > 82 101* 85  BUN 37*   < > 10 6 <5*  CREATININE 1.06*   < > 0.54 0.53 0.54  CALCIUM 9.9   < > 8.5* 8.0* 8.5*  MG 2.3  --  1.7 2.1  --    < > = values in this interval not displayed.    Liver Function Tests: Recent Labs  Lab 04/04/20 1725 04/05/20 1012 04/06/20 0256  AST 17 18 19   ALT 12 8 11   ALKPHOS 95 89 106  BILITOT 0.9 0.9 0.8  PROT 8.6* 6.0* 6.5  ALBUMIN 3.9 2.8* 2.9*    CBG: Recent Labs  Lab 04/06/20 2258 04/07/20 0617 04/07/20 1154  GLUCAP 118* 91 81    Microbiology Studies:   Recent Results (from the past 240 hour(s))  Blood culture (routine x 2)     Status: None (Preliminary result)   Collection Time: 04/04/20  5:25 PM   Specimen: BLOOD RIGHT FOREARM  Result Value Ref Range Status   Specimen Description   Final    BLOOD RIGHT FOREARM Performed at [facility], [place] [address] [place], [place] [address]    Special Requests   Final    BACTERIAL CASTS Blood Culture adequate volume Performed at [place] [address] [place], [place] [address]    Culture   Final    NO GROWTH 2 DAYS Performed at [place] [address], [place],  [address]    Report Status PENDING  Incomplete  Blood culture (routine x 2)     Status: None (Preliminary result)   Collection Time: 04/04/20  5:25 PM   Specimen:  BLOOD LEFT FOREARM  Result Value Ref Range Status   Specimen Description   Final    BLOOD LEFT FOREARM Performed  at [facility], [place] [address] [place], [place] [address]    Special Requests   Final    BLOOD Blood Culture results may not be optimal due to an inadequate volume of blood received in culture bottles Performed at [place] [address], [place], [place] [address]    Culture   Final    NO GROWTH 2 DAYS Performed at [place] [address], [place], [place] [address]    Report Status PENDING  Incomplete  Resp Panel by RT-PCR (Flu A&B, Covid) Nasopharyngeal Swab     Status: None   Collection Time: 04/04/20  5:25 PM   Specimen: Nasopharyngeal Swab; Nasopharyngeal(NP) swabs in vial transport medium  Result Value Ref Range Status   SARS Coronavirus 2 by RT PCR NEGATIVE NEGATIVE Final    Comment: (NOTE) SARS-CoV-2 target nucleic acids are NOT DETECTED.  The SARS-CoV-2 RNA is generally detectable in upper respiratory specimens during the acute phase of infection. The lowest concentration of SARS-CoV-2 viral copies this assay can detect is 138 copies/mL. A negative result does not preclude SARS-Cov-2 infection and should not be used as the sole basis for treatment or other patient management decisions. A negative result may occur with  improper specimen collection/handling, submission of specimen other than nasopharyngeal swab, presence of viral mutation(s) within the areas targeted by this assay, and inadequate number of viral copies(<138 copies/mL). A negative result must be combined with clinical observations, patient history, and epidemiological information. The expected result is Negative.  Fact Sheet for Patients:  EntrepreneurPulse.com.au  Fact Sheet for Healthcare Providers:  IncredibleEmployment.be  This test is no t yet approved or cleared by the [place] FDA and   has been authorized for detection and/or diagnosis of SARS-CoV-2 by FDA under an Emergency Use Authorization (EUA). This EUA will remain  in effect (meaning this test can be used) for the duration of the COVID-19 declaration under Section 564(b)(1) of the Act, 21 U.S.C.section 360bbb-3(b)(1), unless the authorization is terminated  or revoked sooner.       Influenza A by PCR NEGATIVE NEGATIVE Final   Influenza B by PCR NEGATIVE NEGATIVE Final    Comment: (NOTE) The Xpert Xpress SARS-CoV-2/FLU/RSV plus assay is intended as an aid in the diagnosis of influenza from Nasopharyngeal swab specimens and should not be used as a sole basis for treatment. Nasal washings and aspirates are unacceptable for Xpert Xpress SARS-CoV-2/FLU/RSV testing.  Fact Sheet for Patients: EntrepreneurPulse.com.au  Fact Sheet for Healthcare Providers: IncredibleEmployment.be  This test is not yet approved or cleared by the [place] FDA and has been authorized for detection and/or diagnosis of SARS-CoV-2 by FDA under an Emergency Use Authorization (EUA). This EUA will remain in effect (meaning this test can be used) for the duration of the COVID-19 declaration under Section 564(b)(1) of the Act, 21 U.S.C. section 360bbb-3(b)(1), unless the authorization is terminated or revoked.  Performed at [facility], [place] [address], [place], [place] [address]   Urine culture     Status: Abnormal   Collection Time: 04/04/20 10:09 PM   Specimen: In/Out Cath Urine  Result Value Ref Range Status   Specimen Description   Final    IN/OUT CATH URINE Performed at [place] [address] [place], [place] [address]    Special Requests   Final    NONE Performed at [facility], [place] [address] [place], [place] [address]    Culture MULTIPLE  SPECIES PRESENT, SUGGEST RECOLLECTION (A)  Final   Report Status 04/06/2020  FINAL  Final  MRSA PCR Screening     Status: None   Collection Time: 04/04/20 11:20 PM   Specimen: Nasopharyngeal  Result Value Ref Range Status   MRSA by PCR NEGATIVE NEGATIVE Final    Comment:        The GeneXpert MRSA Assay (FDA approved for NASAL specimens only), is one component of a comprehensive MRSA colonization surveillance program. It is not intended to diagnose MRSA infection nor to guide or monitor treatment for MRSA infections. Performed at [facility], [place] [address] [place], [place] [address]      Radiology Studies:  No results found.   Scheduled Meds:   . amoxicillin-clavulanate  1 tablet Oral Q12H  . bethanechol  10 mg Oral TID  . Chlorhexidine Gluconate Cloth  6 each Topical Daily  . docusate sodium  100 mg Oral Daily  . feeding supplement  237 mL Oral TID BM  . gabapentin  200 mg Oral BID  . levETIRAcetam  750 mg Oral BID  . mouth rinse  15 mL Mouth Rinse BID  . polyethylene glycol  17 g Oral Daily  . potassium chloride  40 mEq Oral Once  . rivaroxaban  20 mg Oral Q supper  . rosuvastatin  5 mg Oral Daily    Continuous Infusions:      LOS: 2 days     Vernell Leep, MD, [place], [facility]. [facility]    To contact the attending provider between 7A-7P or the covering provider during after hours 7P-7A, please log into the web site [email or web address] and access using universal [place] password for that web site. If you do not have the password, please call the hospital operator.  04/07/2020, 2:44 PM

## 2020-04-07 NOTE — Progress Notes (Signed)
Palliative Care Progress Note  Reason for visit: goals of care  I met today with Ms. Harts, her son, and her son's fiancee.  She was awake, alert, and able to participate in conversation.  We discussed her clinical course with continued decline in her nutrition, cognition, and functional status.  Her son reports that she had PEG tube up until approximately 3-4 months ago.  It was removed as she continually pulled it out.  Discussed this hospitalization and concerns of aspiration and that she is was not maintaining herself in regard to nutrition and hydration prior to admission.  Her son feels that her poor intake with pain related to dental problems and this is why she has not been able to take enough nutrition and hydration.  He asked about having dental problems evaluated prior to discharging back to facility.  When asked, she agrees with him that she has decreased her intake due to pain related to her teeth.  We discussed that this is the first admission I am aware of due to aspiration and poor intake resulting in hypernatremia and dehydration.  However, I talk with him about the fact that these are often problems that were occur and there is a real possibility that she will decompensate quickly with transition back to skilled facility at which point she would not be maintained through aggressive hospital level interventions.  Her son states that he feels she needs a chance to see how she does prior to " giving up."  We discussed plan for transition back to skilled facility but that there will need to be further discussion about goals moving forward particularly if she is not able to maintain enough nutrition and hydration to sustain herself.  She was previously followed by outpatient palliative care through [facility].  He asked that I reach out to them to check in on her next week or so to see how she is doing.  We also discussed prior MOST form that was completed and I recommended  reviewing and completing a new MOST form as some of these things have changed.  We reviewed a MOST and discussed working to continue interventions that are likely to result in her being well enough to enjoy time outside of the hospital while limiting interventions that are not contributing to this goal.  Her son reports understanding need to continue to address long-term goals of care but feels they need time to discuss the family prior to completion of new MOST form or making any changes to her care plan.  -Full code/full scope -Recommendation for family to complete no MOST form that outlines her wishes for care moving forward.  Son reports they are agreeable to this but they need time to discuss further.  I left a copy of MOST form for him to review. -Recommend palliative care to continue to follow at skilled facility.  Total time: 45 minutes  Greater than 50%  of this time was spent counseling and coordinating care related to the above assessment and plan.  Micheline Rough, MD [place] [facility] [phone]

## 2020-04-07 NOTE — Progress Notes (Signed)
Pharmacy Antibiotic Note  Rachel Vang is a 55 y.o. female admitted on 04/04/2020 with aspiration PNA.  Pharmacy has been consulted for Unasyn dosing.  D#4 abx - Tmax 99.6 - WBC 12.7, improved - SCr 0.53, CrCl 63 ml/min - Cultures neg to date  Plan: Continue Unasyn 3gm q8h If transitions to Augmentin, recommend 875mg  BID  Height: 5\' 6"  (167.6 cm) Weight: 50.8 kg (111 lb 15.9 oz) IBW/kg (Calculated) : 59.3  Temp (24hrs), Avg:99.2 F (37.3 C), Min:98 F (36.7 C), Max:100.8 F (38.2 C)  Recent Labs  Lab 04/04/20 1725 04/04/20 1728 04/04/20 2044 04/04/20 2209 04/05/20 0145 04/05/20 1012 04/06/20 0256 04/07/20 0430  WBC 14.3*  --   --   --   --  13.8* 15.5* 12.7*  CREATININE 1.06*  --   --   --   --  0.72 0.54 0.53  LATICACIDVEN  --  2.6* 6.7* 5.8* 3.3*  --   --   --     Estimated Creatinine Clearance: 63 mL/min (by C-G formula based on SCr of 0.53 mg/dL).    No Known Allergies  Antimicrobials this admission: 3/7 Ceftriaxone & Azithromycin x1 3/8 Unasyn >>    Dose adjustments this admission:   Microbiology results: 3/7 BCx: ngtd 3/7 MRSA PCR; neg Urine Cx: mx species Sputum: ordered, needs collection  Strep pneumo: neg Flu/covid -/-  Thank you for allowing pharmacy to be a part of this patient's care.  5/7, PharmD, BCPS Pharmacy: [phone] 04/07/2020 7:50 AM

## 2020-04-07 NOTE — Progress Notes (Incomplete)
Palliative Care Progress Note  Reason for visit: goals of care  I met today with Rachel Vang, her son, and her son's fiancee.  She was awake, alert, and able to participate in conversation.  We discussed her clinical course with continued decline in her nutrition, cognition, and functional status.  Her son reports that she had PEG tube up until approximately 3-4 months ago.  It was removed as she continually pulled it out.  Discussed this hospitalization and concern as she   -  -

## 2020-04-07 NOTE — Progress Notes (Signed)
  Speech Language Pathology Treatment: Dysphagia  Patient Details Name: Rachel Vang MRN: [id number] DOB: 1965/04/29 Today's Date: 04/07/2020 Time: 5102-5852 SLP Time Calculation (min) (ACUTE ONLY): 25 min  Assessment / Plan / Recommendation Clinical Impression  Pt seen at bedside for follow up per MD request for re-evaluation. Pt was awake, alert, and cooperative during this session. Pt answers yes/no questions with head nods/vocalization, but is able to verbalize yes/no and her name. Oral care was completed, with removal of a mild amount of oral secretions, likely being held orally. Pt accepted trials of thin liquid, puree, and solid textures. Extended oral prep continues to be observed, with mild oral residue which is reduced with liquid wash or puree, and eliminated with suction. No overt s/s aspiration observed following any PO trial. Pt has continued to demonstrate poor PO intake, and has indicated she does not like pureed textures. Dys 1 would be an appropriate choice given poor/painful dentition. She appears to tolerate thin liquids, full liquids, and puree textures, and is able to masticate small bites of solids with assistance to clear oral cavity of residue.    At this time, recommend advancing diet to dys 2 (finely chopped) with extra gravy/sauces for increased moisture to provide a wider range of PO choices, and hopefully increase PO intake. Strict adherence to posted safe swallow precautions will be necessary, due to pt's tendency to hold secretions, food, or liquid orally. Regular thorough oral care is recommended, in addition to use of suction to insure clearing of the oral cavity after PO intake.    HPI HPI: 55 yo female adm to [facility] with concern for asp pna - ? mucous plugging.  Pt with PMH + for GERD, PE=DVT, HLD, HTN, ICH s/p PEG/trach in 2017, IBS, DM2.  ICH 12/2014.  Pt underwent PEG placement 01/2015.  MBS March of 2017 revealed a moderate-severe oral dysphagia with mild  pharyngeal impairments, transient penetration of NTL and recommendations for liquid diet with trials of puree.      SLP Plan  Continue with current plan of care       Recommendations  Diet recommendations: Dysphagia 2 (fine chop);Thin liquid Liquids provided via: Straw Medication Administration:  (whole with liquid or puree per pt preference) Supervision: Staff to assist with self feeding;Full supervision/cueing for compensatory strategies Compensations: Slow rate;Small sips/bites;Follow solids with liquid;Other (Comment) (suction oral cavity after meals to insure clearing) Postural Changes and/or Swallow Maneuvers: Seated upright 90 degrees                Oral Care Recommendations: Oral care QID Follow up Recommendations: Skilled Nursing facility SLP Visit Diagnosis: Dysphagia, oral phase (R13.11) Plan: Continue with current plan of care       GO               Shavon Zenz B. Murvin Natal, [facility], CCC-SLP Speech Language Pathologist Office: [phone] Pager: [phone]  [name] 04/07/2020, 1:43 PM

## 2020-04-08 ENCOUNTER — Inpatient Hospital Stay (HOSPITAL_COMMUNITY): Payer: Medicaid Other

## 2020-04-08 DIAGNOSIS — R9431 Abnormal electrocardiogram [ECG] [EKG]: Secondary | ICD-10-CM | POA: Diagnosis not present

## 2020-04-08 DIAGNOSIS — R000 Tachycardia, unspecified: Secondary | ICD-10-CM

## 2020-04-08 LAB — COMPREHENSIVE METABOLIC PANEL
ALT: 13 U/L (ref 0–44)
AST: 16 U/L (ref 15–41)
Albumin: 2.7 g/dL — ABNORMAL LOW (ref 3.5–5.0)
Alkaline Phosphatase: 107 U/L (ref 38–126)
Anion gap: 10 (ref 5–15)
BUN: <5 mg/dL — ABNORMAL LOW (ref 6–20)
CO2: 23 mmol/L (ref 22–32)
Calcium: 8.7 mg/dL — ABNORMAL LOW (ref 8.9–10.3)
Chloride: 107 mmol/L (ref 98–111)
Creatinine, Ser: 0.53 mg/dL (ref 0.44–1.00)
GFR, Estimated: >60 mL/min (ref >60)
Glucose, Bld: 106 mg/dL — ABNORMAL HIGH (ref 70–99)
Potassium: 3.9 mmol/L (ref 3.5–5.1)
Sodium: 140 mmol/L (ref 135–145)
Total Bilirubin: 0.8 mg/dL (ref 0.3–1.2)
Total Protein: 6.6 g/dL (ref 6.5–8.1)

## 2020-04-08 LAB — ECHOCARDIOGRAM COMPLETE
Area-P 1/2: 3.42 cm2
Height: 66 in
S' Lateral: 3.41 cm
Weight: 1791.9 oz

## 2020-04-08 LAB — GLUCOSE, CAPILLARY
Glucose-Capillary: 105 mg/dL — ABNORMAL HIGH (ref 70–99)
Glucose-Capillary: 123 mg/dL — ABNORMAL HIGH (ref 70–99)
Glucose-Capillary: 217 mg/dL — ABNORMAL HIGH (ref 70–99)

## 2020-04-08 LAB — D-DIMER, QUANTITATIVE: D-Dimer, Quant: 0.64 ug/mL-FEU — ABNORMAL HIGH (ref 0.00–0.50)

## 2020-04-08 MED ORDER — SERTRALINE HCL 50 MG PO TABS
50.0000 mg | ORAL_TABLET | Freq: Every day | ORAL | Status: DC
  Administered 2020-04-08 – 2020-04-14 (×7): 50 mg via ORAL
  Filled 2020-04-08 (×7): qty 1

## 2020-04-08 MED ORDER — LEVETIRACETAM 100 MG/ML PO SOLN
750.0000 mg | Freq: Two times a day (BID) | ORAL | Status: DC
  Administered 2020-04-08 – 2020-04-14 (×13): 750 mg via ORAL
  Filled 2020-04-08 (×13): qty 7.5

## 2020-04-08 MED ORDER — IOHEXOL 350 MG/ML SOLN
75.0000 mL | Freq: Once | INTRAVENOUS | Status: AC | PRN
  Administered 2020-04-08: 75 mL via INTRAVENOUS

## 2020-04-08 MED ORDER — MIRTAZAPINE 15 MG PO TABS
15.0000 mg | ORAL_TABLET | Freq: Every day | ORAL | Status: DC
  Administered 2020-04-08 – 2020-04-13 (×6): 15 mg via ORAL
  Filled 2020-04-08 (×6): qty 1

## 2020-04-08 MED ORDER — AMOXICILLIN-POT CLAVULANATE 400-57 MG/5ML PO SUSR
875.0000 mg | Freq: Two times a day (BID) | ORAL | Status: AC
  Administered 2020-04-08 – 2020-04-12 (×10): 875 mg via ORAL
  Filled 2020-04-08 (×11): qty 10.9

## 2020-04-08 MED ORDER — SODIUM CHLORIDE 0.9 % IV SOLN: INTRAVENOUS | Status: DC

## 2020-04-08 MED ORDER — GABAPENTIN 250 MG/5ML PO SOLN
200.0000 mg | Freq: Two times a day (BID) | ORAL | Status: DC
  Administered 2020-04-08 – 2020-04-14 (×13): 200 mg via ORAL
  Filled 2020-04-08 (×14): qty 4

## 2020-04-08 MED ORDER — TRAZODONE HCL 100 MG PO TABS
100.0000 mg | ORAL_TABLET | Freq: Every day | ORAL | Status: DC
  Administered 2020-04-08 – 2020-04-13 (×6): 100 mg via ORAL
  Filled 2020-04-08 (×6): qty 1

## 2020-04-08 NOTE — [place] FL2 (Signed)
[place] MEDICAID FL2 LEVEL OF CARE SCREENING TOOL     IDENTIFICATION  Patient Name: Rachel Vang Birthdate: 12/23/1965 Sex: female Admission Date (Current Location): 04/04/2020  [place] and [place] Number:  [place] [phone] S Facility and Address:  [facility],  [address], [place] [address]      Provider Number: [id number]  Attending Physician Name and Address:  Elease Etienne, MD  Relative Name and Phone Number:  Sheana Bir son [phone]    Current Level of Care: Hospital Recommended Level of Care: Other (Comment) (LTC) Prior Approval Number:    Date Approved/Denied:   PASRR Number:    Discharge Plan: SNF    Current Diagnoses: Patient Active Problem List   Diagnosis Date Noted  . Malnutrition of moderate degree 04/05/2020  . Aspiration pneumonia (HCC) 04/05/2020  . Sepsis due to pneumonia (HCC) 04/04/2020  . Hypokalemia 04/04/2020  . Prolonged QT interval 04/04/2020  . Hemiplegia as late effect of cerebrovascular accident (CVA) (HCC) 09/19/2017  . Fever 09/06/2017  . Slow transit constipation 06/08/2017  . Insomnia 06/08/2017  . Generalized weakness 06/07/2017  . Aspiration pneumonia of both lower lobes due to gastric secretions (HCC)   . Demand ischemia (HCC)   . Chronic pain of right knee   . Chronic heel pain, right   . Small bowel obstruction (HCC)   . SVT (supraventricular tachycardia) (HCC)   . AKI (acute kidney injury) (HCC) 05/06/2017  . Hypernatremia 05/06/2017  . Dyslipidemia associated with type 2 diabetes mellitus (HCC) 04/27/2017  . Gingivitis, acute, plaque induced 03/26/2017  . Hypersecretion of saliva 03/14/2017  . Bruxism (teeth grinding) 03/14/2017  . Lobar pneumonia (HCC)   . Acute on chronic respiratory failure with hypoxia (HCC)   . Pressure injury of skin 02/16/2017  . Foot drop, right foot 10/18/2016  . CVA (cerebrovascular accident) (HCC) 08/30/2016  . GERD without esophagitis 08/30/2016  .  Chronic pulmonary embolism (HCC) 07/03/2016  . Weight loss, non-intentional 06/13/2016  . Type II diabetes mellitus with neurological manifestations (HCC) 05/20/2015  . Essential hypertension, benign 05/20/2015  . Status post insertion of percutaneous endoscopic gastrostomy (PEG) tube (HCC) 05/20/2015  . DVT (deep venous thrombosis) (HCC) 04/05/2015  . Protein-calorie malnutrition, severe (HCC) 03/25/2015  . Dysphagia 02/18/2015  . Seizures (HCC) 02/18/2015  . Elevated troponin 02/18/2015  . Urine retention 02/18/2015  . History of ETT   . Subarachnoid hemorrhage (HCC)     Orientation RESPIRATION BLADDER Height & Weight     Self,Time,Situation  Normal Continent Weight: 50.8 kg Height:  5\' 6"  (167.6 cm)  BEHAVIORAL SYMPTOMS/MOOD NEUROLOGICAL BOWEL NUTRITION STATUS      Continent Diet  AMBULATORY STATUS COMMUNICATION OF NEEDS Skin   Limited Assist Verbally Normal                       Personal Care Assistance Level of Assistance  Bathing,Feeding,Dressing Bathing Assistance: Limited assistance Feeding assistance: Limited assistance Dressing Assistance: Limited assistance     Functional Limitations Info  Sight,Hearing,Speech Sight Info: Impaired Hearing Info: Adequate Speech Info: Adequate    SPECIAL CARE FACTORS FREQUENCY                       Contractures Contractures Info: Not present    Additional Factors Info  Code Status,Allergies Code Status Info:  (Full) Allergies Info:  (NKA)           Current Medications (04/08/2020):  This is the current  hospital active medication list Current Facility-Administered Medications  Medication Dose Route Frequency Provider Last Rate Last Admin  . acetaminophen (TYLENOL) tablet 650 mg  650 mg Oral Q6H PRN Bobette Mo, MD   650 mg at 04/06/20 1950   Or  . acetaminophen (TYLENOL) suppository 650 mg  650 mg Rectal Q6H PRN Bobette Mo, MD      . amoxicillin-clavulanate (AUGMENTIN) 400-57 MG/5ML  suspension 875 mg  875 mg Oral Q12H Hongalgi, Anand D, MD      . bethanechol (URECHOLINE) tablet 10 mg  10 mg Oral TID Elease Etienne, MD   10 mg at 04/07/20 2222  . Chlorhexidine Gluconate Cloth 2 % PADS 6 each  6 each Topical Daily Bobette Mo, MD   6 each at 04/07/20 1017  . feeding supplement (ENSURE ENLIVE / ENSURE PLUS) liquid 237 mL  237 mL Oral TID BM Meredeth Ide, MD   237 mL at 04/07/20 2059  . gabapentin (NEURONTIN) 250 MG/5ML solution 200 mg  200 mg Oral Q12H Hongalgi, Anand D, MD      . levETIRAcetam (KEPPRA) 100 MG/ML solution 750 mg  750 mg Oral BID Elease Etienne, MD      . MEDLINE mouth rinse  15 mL Mouth Rinse BID Bobette Mo, MD   15 mL at 04/07/20 2234  . mirtazapine (REMERON) tablet 15 mg  15 mg Oral QHS Hongalgi, Anand D, MD      . morphine 2 MG/ML injection 2 mg  2 mg Intravenous Q3H PRN Manuela Schwartz, NP   2 mg at 04/08/20 1422  . prochlorperazine (COMPAZINE) injection 5 mg  5 mg Intravenous Q6H PRN Bobette Mo, MD      . rivaroxaban Carlena Hurl) tablet 20 mg  20 mg Oral Q supper Elease Etienne, MD   20 mg at 04/07/20 1755  . rosuvastatin (CRESTOR) tablet 5 mg  5 mg Oral Daily Elease Etienne, MD   5 mg at 04/07/20 1005  . sertraline (ZOLOFT) tablet 50 mg  50 mg Oral Daily Hongalgi, Anand D, MD      . traZODone (DESYREL) tablet 100 mg  100 mg Oral QHS Hongalgi, Maximino Greenland, MD         Discharge Medications: Please see discharge summary for a list of discharge medications.  Relevant Imaging Results:  Relevant Lab Results:   Additional Information SSN: [id number]  Lanier Clam, RN

## 2020-04-08 NOTE — Progress Notes (Signed)
Pt was bladder scanned at 0100 with 8 mL urine residual. Patient was bladder scanned again at 0700 with 162 mL urine residual. Patient is not taking much by mouth. Some of what patient took by mouth, patient spit out. Night shift nurse let day shift nurse know.

## 2020-04-08 NOTE — Progress Notes (Signed)
  Echocardiogram 2D Echocardiogram has been performed.  Rachel Vang Rachel Vang 04/08/2020, 1:35 PM

## 2020-04-08 NOTE — Plan of Care (Signed)
  Problem: Education: Goal: Knowledge of General Education information will improve Description: Including pain rating scale, medication(s)/side effects and non-pharmacologic comfort measures Outcome: Progressing   Problem: Coping: Goal: Level of anxiety will decrease Outcome: Progressing   Problem: Elimination: Goal: Will not experience complications related to bowel motility Outcome: Progressing   Problem: Pain Managment: Goal: General experience of comfort will improve Outcome: Progressing   Problem: Safety: Goal: Ability to remain free from injury will improve Outcome: Progressing   Problem: Skin Integrity: Goal: Risk for impaired skin integrity will decrease Outcome: Progressing   Problem: Respiratory: Goal: Ability to maintain adequate ventilation will improve Outcome: Progressing

## 2020-04-08 NOTE — Progress Notes (Signed)
Patient's heart rate got up to the mid-130's to upper-130's. Patient denied pain. Patient's heart rate during vital signs was 110. Notified provider on call about patient's heart rate. No new orders were given other than to check patient's CMP blood levels.

## 2020-04-08 NOTE — [place] FL2 (Signed)
[place] MEDICAID FL2 LEVEL OF CARE SCREENING TOOL     IDENTIFICATION  Patient Name: Rachel Vang Birthdate: 02/25/1965 Sex: female Admission Date (Current Location): 04/04/2020  [facility] and [place] Number:  [employer] and Address:  [facility],  [address] [place] [address]      Provider Number: [id number]  Attending Physician Name and Address:  Elease Etienne, MD  Relative Name and Phone Number:  Semira Stoltzfus son [phone]    Current Level of Care: Hospital Recommended Level of Care: Skilled Nursing Facility Prior Approval Number:    Date Approved/Denied:   PASRR Number:    Discharge Plan: SNF    Current Diagnoses: Patient Active Problem List   Diagnosis Date Noted  . Malnutrition of moderate degree 04/05/2020  . Aspiration pneumonia (HCC) 04/05/2020  . Sepsis due to pneumonia (HCC) 04/04/2020  . Hypokalemia 04/04/2020  . Prolonged QT interval 04/04/2020  . Hemiplegia as late effect of cerebrovascular accident (CVA) (HCC) 09/19/2017  . Fever 09/06/2017  . Slow transit constipation 06/08/2017  . Insomnia 06/08/2017  . Generalized weakness 06/07/2017  . Aspiration pneumonia of both lower lobes due to gastric secretions (HCC)   . Demand ischemia (HCC)   . Chronic pain of right knee   . Chronic heel pain, right   . Small bowel obstruction (HCC)   . SVT (supraventricular tachycardia) (HCC)   . AKI (acute kidney injury) (HCC) 05/06/2017  . Hypernatremia 05/06/2017  . Dyslipidemia associated with type 2 diabetes mellitus (HCC) 04/27/2017  . Gingivitis, acute, plaque induced 03/26/2017  . Hypersecretion of saliva 03/14/2017  . Bruxism (teeth grinding) 03/14/2017  . Lobar pneumonia (HCC)   . Acute on chronic respiratory failure with hypoxia (HCC)   . Pressure injury of skin 02/16/2017  . Foot drop, right foot 10/18/2016  . CVA (cerebrovascular accident) (HCC) 08/30/2016  . GERD without esophagitis 08/30/2016  . Chronic  pulmonary embolism (HCC) 07/03/2016  . Weight loss, non-intentional 06/13/2016  . Type II diabetes mellitus with neurological manifestations (HCC) 05/20/2015  . Essential hypertension, benign 05/20/2015  . Status post insertion of percutaneous endoscopic gastrostomy (PEG) tube (HCC) 05/20/2015  . DVT (deep venous thrombosis) (HCC) 04/05/2015  . Protein-calorie malnutrition, severe (HCC) 03/25/2015  . Dysphagia 02/18/2015  . Seizures (HCC) 02/18/2015  . Elevated troponin 02/18/2015  . Urine retention 02/18/2015  . History of ETT   . Subarachnoid hemorrhage (HCC)     Orientation RESPIRATION BLADDER Height & Weight     Self,Time,Situation  Normal Continent Weight: 50.8 kg Height:  5\' 6"  (167.6 cm)  BEHAVIORAL SYMPTOMS/MOOD NEUROLOGICAL BOWEL NUTRITION STATUS      Continent Diet  AMBULATORY STATUS COMMUNICATION OF NEEDS Skin   Limited Assist Verbally Normal                       Personal Care Assistance Level of Assistance  Bathing,Feeding,Dressing Bathing Assistance: Limited assistance Feeding assistance: Limited assistance Dressing Assistance: Limited assistance     Functional Limitations Info  Sight,Hearing,Speech Sight Info: Impaired Hearing Info: Adequate Speech Info: Adequate    SPECIAL CARE FACTORS FREQUENCY                       Contractures Contractures Info: Not present    Additional Factors Info  Code Status,Allergies Code Status Info:  (Full) Allergies Info:  (NKA)           Current Medications (04/08/2020):  This is the  current hospital active medication list Current Facility-Administered Medications  Medication Dose Route Frequency Provider Last Rate Last Admin  . acetaminophen (TYLENOL) tablet 650 mg  650 mg Oral Q6H PRN Bobette Mo, MD   650 mg at 04/06/20 1950   Or  . acetaminophen (TYLENOL) suppository 650 mg  650 mg Rectal Q6H PRN Bobette Mo, MD      . amoxicillin-clavulanate (AUGMENTIN) 400-57 MG/5ML suspension  875 mg  875 mg Oral Q12H Hongalgi, Anand D, MD      . bethanechol (URECHOLINE) tablet 10 mg  10 mg Oral TID Elease Etienne, MD   10 mg at 04/07/20 2222  . Chlorhexidine Gluconate Cloth 2 % PADS 6 each  6 each Topical Daily Bobette Mo, MD   6 each at 04/07/20 1017  . feeding supplement (ENSURE ENLIVE / ENSURE PLUS) liquid 237 mL  237 mL Oral TID BM Meredeth Ide, MD   237 mL at 04/07/20 2059  . gabapentin (NEURONTIN) 250 MG/5ML solution 200 mg  200 mg Oral Q12H Hongalgi, Anand D, MD      . levETIRAcetam (KEPPRA) 100 MG/ML solution 750 mg  750 mg Oral BID Elease Etienne, MD      . MEDLINE mouth rinse  15 mL Mouth Rinse BID Bobette Mo, MD   15 mL at 04/07/20 2234  . mirtazapine (REMERON) tablet 15 mg  15 mg Oral QHS Hongalgi, Anand D, MD      . morphine 2 MG/ML injection 2 mg  2 mg Intravenous Q3H PRN Manuela Schwartz, NP   2 mg at 04/08/20 1422  . prochlorperazine (COMPAZINE) injection 5 mg  5 mg Intravenous Q6H PRN Bobette Mo, MD      . rivaroxaban Carlena Hurl) tablet 20 mg  20 mg Oral Q supper Elease Etienne, MD   20 mg at 04/07/20 1755  . rosuvastatin (CRESTOR) tablet 5 mg  5 mg Oral Daily Elease Etienne, MD   5 mg at 04/07/20 1005  . sertraline (ZOLOFT) tablet 50 mg  50 mg Oral Daily Hongalgi, Anand D, MD      . traZODone (DESYREL) tablet 100 mg  100 mg Oral QHS Hongalgi, Maximino Greenland, MD         Discharge Medications: Please see discharge summary for a list of discharge medications.  Relevant Imaging Results:  Relevant Lab Results:   Additional Information SSN: [id number]  Lanier Clam, RN

## 2020-04-08 NOTE — Progress Notes (Addendum)
PROGRESS NOTE   Rachel Vang  WUJ:[id number]    DOB: 1965-05-24    DOA: 04/04/2020  PCP: Patient, No Pcp Per   I have briefly reviewed patients previous medical records in [facility].  Chief Complaint  Patient presents with  . Failure To Thrive    Brief Narrative:  55 year old female, medical history including but not limited to pulmonary embolism, DVT, type II DM, epilepsy, GERD, HLD, HTN, nontraumatic subarachnoid hemorrhage, left hemiparesis, IBS, dysphagia,?  Nursing home resident, nonambulatory/wheelchair mobile, presented to the ED due to not eating or drinking for past 1 week PTA.  Admitted for sepsis due to aspiration pneumonia from pre-existing dysphagia, dehydration with hypernatremia.  Clinically improved.  Transferred to medical bed 3/10.  Ongoing poor oral intake/pocketing of food.  Also persistent sinus tachycardia, unclear etiology, working up.   Assessment & Plan:  Principal Problem:   Sepsis due to pneumonia [facility]) Active Problems:   Seizures ([place])   DVT (deep venous thrombosis) (HCC)   Type II diabetes mellitus with neurological manifestations ([place])   Essential hypertension, benign   Chronic pulmonary embolism (HCC)   Dyslipidemia associated with type 2 diabetes mellitus (HCC)   Hypernatremia   Hemiplegia as late effect of cerebrovascular accident (CVA) ([place])   Hypokalemia   Prolonged QT interval   Malnutrition of moderate degree   Aspiration pneumonia (HCC)   Severe sepsis, POA, secondary to aspiration pneumonia from pre-existing dysphagia:  Met sepsis criteria on admission including fever of 100.3 F, tachypnea in the 20s-occasionally low 30s, tachycardic and hypoxic at 89% on 10 L/min oxygen/NRB.  Neutrophilic leukocytosis/WBC 14.3 on admission.  Lactate peaked to 6.7.  Flu panel and COVID-19 PCR negative.  Urine streptococcal antigen negative.  Blood cultures remain negative today.  MRSA PCR negative.  Urine microscopy not suggestive of UTI and  culture showed multiple species.  HIV screen nonreactive.  S/p IV azithromycin x1 dose and ceftriaxone x1 dose on 3/7.  Then treated with IV Unasyn 3/7 >.  Completed approximately 3 days of IV antibiotics.  Although intermittent low-grade fevers, she looks good, not septic or toxic looking, tachycardia switched to Augmentin 3/10 x5 days.    Recommend follow-up of chest x-ray in 4 weeks.  Improved.  Discussed with patient and son at bedside that patient remains at high risk for recurrent aspiration due to underlying dysphagia issues and also cognitive impairment.  Sinus tachycardia  Asymptomatic.  Unclear etiology.  Likely multifactorial-pain at times.  Given prior history of DVT/PE on Xarelto, bedbound status, lower extremity contractures, at risk for VTE's, therefore checked D-dimer which is positive, will follow up with CTA chest to rule out recurrent PE and consider lower extremity venous Dopplers given pain.  Also check 2D echo to make sure normal EF.  At risk for rate related cardiomyopathy.  If EF normal, consider low-dose beta-blockers as discussed with rounding cardiologist today.  Follow TSH.  Addendum:  D-dimer elevated.  Bilateral LEV Dopplers: No evidence of DVT  Discussed with son, agreeable to CTA chest-ordered.  2D echo: Moderate pericardial effusion anterior to right ventricle.  Mild RV diastolic compression without RV diastolic collapse.  LVEF: 60 to 65%.  I reviewed patient's echo with cardiologist on-call, no features of tamponade, agrees with gentle IV fluids.  Dysphagia  Chronic.?  Related to prior subarachnoid hemorrhage.  Not sure if she had a CVA.  Speech therapy follow-up appreciated and have advance diet to dysphagia 2 diet and thin liquids with which hopefully her oral intake  will improve.  Dr. Domingo Cocking, palliative care MD and I had separate and detailed conversation with patient regarding PEG tube which she has repeatedly declined.  She reportedly had  a PEG tube in the past which had to be removed due to leakage around the tube.  Speech therapy follow-up appreciated and recommend ongoing dysphagia 2 (fine chopped), dysphagia 1 (pured) and thin liquids.  Patient noted to be pocketing food.  Dehydration with hypernatremia  Secondary to poor oral intake.  Presented with serum sodium of 158.  Treated with hypotonic/half-normal saline.  Resolved.  IVF discontinued.  Will be at risk of recurrent dehydration if has inconsistent oral intake.  Hypokalemia  Replaced  Magnesium up to 2.1.  Lactic acidosis:  Secondary to sepsis and acute respiratory failure.  Lactate peaked to 6.7.  Treated per sepsis protocol with aggressive IV fluids and improved.  Acute respiratory failure with hypoxia  Secondary to aspiration pneumonia  Resolved.  Seizure disorder  Continue prior home dose of Keppra.  Essential hypertension  Mildly uncontrolled at times  Dyslipidemia  Continue home rosuvastatin.  Type II DM  Well-controlled.  History of DVT/PE  Briefly on full dose Lovenox which has been switched back to her home dose of Xarelto.    See discussion above regarding further work-up of sinus tachycardia.  Prolonged QTC  Improved, EKG 3/10: QTC 456 ms  Continue to replace low potassium.  Avoid QT prolonging medications.  Adult failure to thrive  Likely major component being poor oral intake related to dysphagia.  Palliative care MD input appreciated: Full code/full scope, family to complete MOST form when able, palliative care to continue to follow at SNF.  Anemia of chronic disease  Stable.  Thrombocytopenia  Possibly related to sepsis.  Improving.  Follow CBC in a.m.  Diarrhea  Appears to have resolved.  Low index of suspicion for C. difficile.  Body mass index is 18.08 kg/m.  Nutritional Status Nutrition Problem: Moderate Malnutrition Etiology: chronic illness,dysphagia Signs/Symptoms: mild fat  depletion,moderate muscle depletion Interventions: Refer to RD note for recommendations    DVT prophylaxis:   Full dose Lovenox   Code Status: Full Code Family Communication: I discussed in detail with patient's son in person at patient's bedside.  He then put his brother on speaker phone and we discussed in detail.  Answered all questions and concerns.  Patient's RN at bedside as well. Disposition:  Status is: Inpatient  Remains inpatient appropriate because:Inpatient level of care appropriate due to severity of illness   Dispo: The patient is from: LTC SNF at [facility]              Anticipated d/c is to: Return to LTC SNF at [facility], possibly 04/09/2020.              Patient currently is not medically stable to d/c.   Difficult to place patient No        Consultants:   Palliative care medicine  Procedures:   None  Antimicrobials:    Anti-infectives (From admission, onward)   Start     Dose/Rate Route Frequency Ordered Stop   04/08/20 1030  amoxicillin-clavulanate (AUGMENTIN) 400-57 MG/5ML suspension 875 mg        875 mg Oral Every 12 hours 04/08/20 0935     04/07/20 2200  amoxicillin-clavulanate (AUGMENTIN) 875-125 MG per tablet 1 tablet  Status:  Discontinued        1 tablet Oral Every 12 hours 04/07/20 1417 04/08/20 0935   04/05/20 0400  Ampicillin-Sulbactam (UNASYN) 3 g in sodium chloride 0.9 % 100 mL IVPB  Status:  Discontinued        3 g 200 mL/hr over 30 Minutes Intravenous Every 8 hours 04/04/20 2047 04/07/20 1417   04/04/20 1700  cefTRIAXone (ROCEPHIN) 2 g in sodium chloride 0.9 % 100 mL IVPB  Status:  Discontinued        2 g 200 mL/hr over 30 Minutes Intravenous Every 24 hours 04/04/20 1657 04/04/20 2026   04/04/20 1700  azithromycin (ZITHROMAX) 500 mg in sodium chloride 0.9 % 250 mL IVPB  Status:  Discontinued        500 mg 250 mL/hr over 60 Minutes Intravenous Every 24 hours 04/04/20 1657 04/04/20 2026        Subjective:  Patient interviewed  and examined along with RN and patient's son at bedside.  Patient not saying much.  Nods to deny complaints.  Intermittently appears to be in pain and severely contracted lower extremities.  Pocketing food persistently during the entire time that I was in the patient's room.  Objective:   Vitals:   04/07/20 2129 04/07/20 2245 04/08/20 0258 04/08/20 0650  BP: 119/85 119/85 104/85 112/80  Pulse: (!) 109 (!) 110 (!) 115 (!) 114  Resp: 17 16 17 17   Temp: 97.6 F (36.4 C) 98.3 F (36.8 C) 98.6 F (37 C) 98.6 F (37 C)  TempSrc: Oral Oral Oral   SpO2: 94% 94% 98%   Weight:      Height:        General exam: Young female, small built and frail, lying comfortably propped up in bed without distress.   ENT: Pocketing food.  After she was made to spit out all of it there was still some left in the mouth.  She just has 4-5 front teeth on the top and below, difficult to clearly examine because of food residue but appears that her teeth require cleaning but did not really notice any acute findings or caries.  No other acute findings noted. Respiratory system: Clear to auscultation.  No increased work of breathing. Cardiovascular system: S1 and S2 heard, regular mild tachycardia.  No JVD, murmurs or pedal edema.  Telemetry personally reviewed: At baseline sinus tachycardia in the 100-110 Intermittently goes up to 130s, ST Gastrointestinal system: Abdomen is nondistended, soft and nontender.  No organomegaly or masses appreciated.  Normal bowel sounds heard. Central nervous system: Alert but not saying much, has mouth closed with food in the mouth.  Does follow simple instructions. Extremities: Grade 5 x 5 power in right upper extremity, 4+ by 5 left hand grip.  Bilateral lower extremity with significant contractures. Skin: No rashes, lesions or ulcers Psychiatry: Judgement and insight appear impaired. Mood & affect flat today.    Data Reviewed:   I have personally reviewed following labs and imaging  studies   CBC: Recent Labs  Lab 04/04/20 1725 04/05/20 1012 04/06/20 0256 04/07/20 0430  WBC 14.3* 13.8* 15.5* 12.7*  NEUTROABS 11.2* 10.8*  --   --   HGB 14.4 10.9* 10.6* 10.3*  HCT 47.2* 36.7 35.0* 32.6*  MCV 99.8 105.2* 101.2* 97.6  PLT 214 111* 115* 123*    Basic Metabolic Panel: Recent Labs  Lab 04/04/20 1725 04/05/20 1012 04/06/20 0256 04/07/20 0430 04/07/20 1133 04/08/20 0532  NA 158*   < > 144 137 141 140  K 3.2*   < > 2.7* 2.9* 3.5 3.9  CL 117*   < > 105 101 103 107  CO2 29   < > 27 26 28 23   GLUCOSE 165*   < > 82 101* 85 106*  BUN 37*   < > 10 6 <5* <5*  CREATININE 1.06*   < > 0.54 0.53 0.54 0.53  CALCIUM 9.9   < > 8.5* 8.0* 8.5* 8.7*  MG 2.3  --  1.7 2.1  --   --    < > = values in this interval not displayed.    Liver Function Tests: Recent Labs  Lab 04/05/20 1012 04/06/20 0256 04/08/20 0532  AST 18 19 16   ALT 8 11 13   ALKPHOS 89 106 107  BILITOT 0.9 0.8 0.8  PROT 6.0* 6.5 6.6  ALBUMIN 2.8* 2.9* 2.7*    CBG: Recent Labs  Lab 04/07/20 2327 04/08/20 0558 04/08/20 1146  GLUCAP 119* 105* 123*    Microbiology Studies:   Recent Results (from the past 240 hour(s))  Blood culture (routine x 2)     Status: None (Preliminary result)   Collection Time: 04/04/20  5:25 PM   Specimen: BLOOD RIGHT FOREARM  Result Value Ref Range Status   Specimen Description   Final    BLOOD RIGHT FOREARM Performed at [facility] [address] [place], [place] [address]    Special Requests   Final    BACTERIAL CASTS Blood Culture adequate volume Performed at [facility] [address], [place], [place] [address]    Culture   Final    NO GROWTH 3 DAYS Performed at [facility], [place] [address], [place], [place] [address]    Report Status PENDING  Incomplete  Blood culture (routine x 2)     Status: None (Preliminary result)   Collection Time: 04/04/20  5:25 PM   Specimen: BLOOD LEFT FOREARM  Result Value  Ref Range Status   Specimen Description   Final    BLOOD LEFT FOREARM Performed at [facility] [address], [place], [place] [address]    Special Requests   Final    BLOOD Blood Culture results may not be optimal due to an inadequate volume of blood received in culture bottles Performed at [facility] [address] [place], [place] [address]    Culture   Final    NO GROWTH 3 DAYS Performed at [facility], [place] [address] [place],  [address]    Report Status PENDING  Incomplete  Resp Panel by RT-PCR (Flu A&B, Covid) Nasopharyngeal Swab     Status: None   Collection Time: 04/04/20  5:25 PM   Specimen: Nasopharyngeal Swab; Nasopharyngeal(NP) swabs in vial transport medium  Result Value Ref Range Status   SARS Coronavirus 2 by RT PCR NEGATIVE NEGATIVE Final    Comment: (NOTE) SARS-CoV-2 target nucleic acids are NOT DETECTED.  The SARS-CoV-2 RNA is generally detectable in upper respiratory specimens during the acute phase of infection. The lowest concentration of SARS-CoV-2 viral copies this assay can detect is 138 copies/mL. A negative result does not preclude SARS-Cov-2 infection and should not be used as the sole basis for treatment or other patient management decisions. A negative result may occur with  improper specimen collection/handling, submission of specimen other than nasopharyngeal swab, presence of viral mutation(s) within the areas targeted by this assay, and inadequate number of viral copies(<138 copies/mL). A negative result must be combined with clinical observations, patient history, and epidemiological information. The expected result is Negative.  Fact Sheet for Patients:  EntrepreneurPulse.com.au  Fact Sheet for Healthcare Providers:  IncredibleEmployment.be  This test is no t yet approved or cleared by the [place] FDA and  has been authorized for detection  and/or diagnosis of SARS-CoV-2 by FDA under an Emergency Use Authorization (EUA). This EUA will remain  in effect (meaning this test can be used) for the duration of the COVID-19 declaration under Section 564(b)(1) of the Act, 21 U.S.C.section 360bbb-3(b)(1), unless the authorization is terminated  or revoked sooner.       Influenza A by PCR NEGATIVE NEGATIVE Final   Influenza B by PCR NEGATIVE NEGATIVE Final    Comment: (NOTE) The Xpert Xpress SARS-CoV-2/FLU/RSV plus assay is intended as an aid in the diagnosis of influenza from Nasopharyngeal swab specimens and should not be used as a sole basis for treatment. Nasal washings and aspirates are unacceptable for Xpert Xpress SARS-CoV-2/FLU/RSV testing.  Fact Sheet for Patients: EntrepreneurPulse.com.au  Fact Sheet for Healthcare Providers: IncredibleEmployment.be  This test is not yet approved or cleared by the [place] FDA and has been authorized for detection and/or diagnosis of SARS-CoV-2 by FDA under an Emergency Use Authorization (EUA). This EUA will remain in effect (meaning this test can be used) for the duration of the COVID-19 declaration under Section 564(b)(1) of the Act, 21 U.S.C. section 360bbb-3(b)(1), unless the authorization is terminated or revoked.  Performed at [facility], [place] [address], [place], [place] [address]   Urine culture     Status: Abnormal   Collection Time: 04/04/20 10:09 PM   Specimen: In/Out Cath Urine  Result Value Ref Range Status   Specimen Description   Final    IN/OUT CATH URINE Performed at [facility] [address] [place], [place] [address]    Special Requests   Final    NONE Performed at [facility], [place] [address], [place], [place] [address]    Culture MULTIPLE SPECIES PRESENT, SUGGEST RECOLLECTION (A)  Final   Report Status 04/06/2020 FINAL  Final  MRSA PCR Screening      Status: None   Collection Time: 04/04/20 11:20 PM   Specimen: Nasopharyngeal  Result Value Ref Range Status   MRSA by PCR NEGATIVE NEGATIVE Final    Comment:        The GeneXpert MRSA Assay (FDA approved for NASAL specimens only), is one component of a comprehensive MRSA colonization surveillance program. It is not intended to diagnose MRSA infection nor to guide or monitor treatment for MRSA infections. Performed at [facility], [place] [address], [place], [place] [address]      Radiology Studies:  No results found.   Scheduled Meds:   . amoxicillin-clavulanate  875 mg Oral Q12H  . bethanechol  10 mg Oral TID  . Chlorhexidine Gluconate Cloth  6 each Topical Daily  . feeding supplement  237 mL Oral TID BM  . gabapentin  200 mg Oral Q12H  . levETIRAcetam  750 mg Oral BID  . mouth rinse  15 mL Mouth Rinse BID  . rivaroxaban  20 mg Oral Q supper  . rosuvastatin  5 mg Oral Daily    Continuous Infusions:      LOS: 3 days     Vernell Leep, MD, [place], [facility]. [facility]    To contact the attending provider between 7A-7P or the covering provider during after hours 7P-7A, please log into the web site [email or web address] and access using universal [place] password for that web site. If you  do not have the password, please call the hospital operator.  04/08/2020, 1:12 PM

## 2020-04-08 NOTE — TOC Transition Note (Signed)
Transition of Care [facility]) - CM/SW Discharge Note   Patient Details  Name: Rachel Vang MRN: [id number] Date of Birth: 1965/06/02  [facility]) CM/SW Contact:  Lanier Clam, RN Phone Number: 04/08/2020, 3:30 PM   Clinical Narrative: d/c back to [facility], covid needed. Can return on weekend.      Final next level of care: Skilled Nursing Facility Barriers to Discharge: Continued Medical Work up   Patient Goals and CMS Choice Patient states their goals for this hospitalization and ongoing recovery are:: return back to rehab CMS Medicare.gov Compare Post Acute Care list provided to:: Patient Represenative (must comment) Choice offered to / list presented to : Patient  Discharge Placement                       Discharge Plan and Services   Discharge Planning Services: CM Consult Post Acute Care Choice:  (room 230)                               Social Determinants of Health (SDOH) Interventions     Readmission Risk Interventions No flowsheet data found.

## 2020-04-08 NOTE — Progress Notes (Signed)
   04/08/20 0258  Assess: MEWS Score  Temp 98.6 F (37 C)  BP 104/85  Pulse Rate (!) 115  Resp 17  Level of Consciousness Alert  SpO2 98 %  O2 Device Room Air  Assess: MEWS Score  MEWS Temp 0  MEWS Systolic 0  MEWS Pulse 2  MEWS RR 0  MEWS LOC 0  MEWS Score 2  MEWS Score Color Yellow  Assess: if the MEWS score is Yellow or Red  Were vital signs taken at a resting state? Yes  Focused Assessment No change from prior assessment  Early Detection of Sepsis Score *See Row Information* Low  MEWS guidelines implemented *See Row Information* No, previously yellow, continue vital signs every 4 hours  Notify: Charge Nurse/RN  Name of Charge Nurse/RN Notified [place] Cummings, RN  Date Charge Nurse/RN Notified 04/08/20  Time Charge Nurse/RN Notified 0307 (Reached at 0315)

## 2020-04-08 NOTE — Progress Notes (Signed)
Bilateral lower extremity venous duplex has been completed. Preliminary results can be found in CV Proc through chart review.   04/08/20 2:46 PM Olen Cordial RVT

## 2020-04-08 NOTE — Progress Notes (Signed)
Palliative Care Progress Note  Reason for visit: goals of care  I met again today with Rachel Vang.  Her aunt was at her bedside.  We discussed again concerns about her intake and aspiration.  Her aunt believes that she is not eating because her mouth hurts from dental pain.  Discussed recommendation for outpatient follow-up for dental issues.    We also discussed plan for palliative care follow-up as an outpatient to determine long term goals based upon how she does over the next few weeks.  - Full code/full scope - Recommend palliative care to continue to follow at skilled facility. - Recommend outpatient follow-up for dental issues.    Total time: 20 minutes  Greater than 50%  of this time was spent counseling and coordinating care related to the above assessment and plan.  Micheline Rough, MD [place] Team [phone]

## 2020-04-08 NOTE — Progress Notes (Signed)
  Speech Language Pathology Treatment: Dysphagia  Patient Details Name: Rachel Vang MRN: [id number] DOB: 02-16-1965 Today's Date: 04/08/2020 Time: 7341-9379 SLP Time Calculation (min) (ACUTE ONLY): 25 min  Assessment / Plan / Recommendation Clinical Impression  Pt seen at bedside for assessment of diet tolerance and continued education. Pt transferred from ICU, and safe swallow precaution sheets were not sent with pt. RN contacted SLP to request signs for pt new room. Upon arrival, pt was awake and alert. Pt was demonstrating oral holding, but suction was not set up correctly. Oral care was completed once suction was working properly, with successful removal of saliva and residual food. Pt is aware of her tendency for oral holding, and may benefit from having suction within reach to facilitate effective clearing on a more regular basis. Pt accepted trials of thin liquid, but was noted to take a large bolus. With oral holding/delayed swallow, pt aspiration risk increases significantly. RN reports oral holding of whole meds. Recommend crushed meds in puree, or in liquid form for pt to drink. Use of syringe for med administration is not recommended.  Pt continues to maintain that she does NOT want a PEG tube replaced. Unfortunately, her PO intake continues to be poor and oral holding is an ongoing occurrence and aspiration risk. Diet was advanced to dys 2 (finely chopped) with extra gravy/sauce for moisture, thin liquids to provide more options from which to choose. This may be too liberal a recommendation, however, pt refuses pureed diet. Use of suction to clear oral cavity frequently during the day is CRITICAL to minimize aspiration risk. Safe swallow precautions posted at [facility]. SLP will continue to follow for education, especially for staff. This is not a new situation for the patient. Follow up at next level of care is recommended for ongoing education if needed.   HPI HPI: 55 yo female adm to [facility]  with concern for asp pna - ? mucous plugging.  Pt with PMH + for GERD, PE=DVT, HLD, HTN, ICH s/p PEG/trach in 2017, IBS, DM2.  ICH 12/2014.  Pt underwent PEG placement 01/2015.  MBS March of 2017 revealed a moderate-severe oral dysphagia with mild pharyngeal impairments, transient penetration of NTL and recommendations for liquid diet with trials of puree.      SLP Plan  Continue with current plan of care       Recommendations  Diet recommendations: Dysphagia 2 (fine chop);Dysphagia 1 (puree);Thin liquid Liquids provided via: Straw Medication Administration: Crushed with puree (RN reports holding of whole meds) Supervision: Staff to assist with self feeding;Full supervision/cueing for compensatory strategies Compensations: Slow rate;Small sips/bites;Follow solids with liquid;Other (Comment) (suction oral cavity after meals to insure clearing)                Oral Care Recommendations: Oral care QID Follow up Recommendations: Skilled Nursing facility SLP Visit Diagnosis: Dysphagia, oral phase (R13.11) Plan: Continue with current plan of care       GO              Rachel Worth B. Murvin Natal, [facility], CCC-SLP Speech Language Pathologist Office: [phone] Pager: [phone]  Rachel Vang 04/08/2020, 10:52 AM

## 2020-04-09 DIAGNOSIS — E876 Hypokalemia: Secondary | ICD-10-CM

## 2020-04-09 LAB — CBC
HCT: 29 % — ABNORMAL LOW (ref 36.0–46.0)
Hemoglobin: 8.9 g/dL — ABNORMAL LOW (ref 12.0–15.0)
MCH: 30.6 pg (ref 26.0–34.0)
MCHC: 30.7 g/dL (ref 30.0–36.0)
MCV: 99.7 fL (ref 80.0–100.0)
Platelets: 171 10*3/uL (ref 150–400)
RBC: 2.91 MIL/uL — ABNORMAL LOW (ref 3.87–5.11)
RDW: 15.8 % — ABNORMAL HIGH (ref 11.5–15.5)
WBC: 7.7 10*3/uL (ref 4.0–10.5)
nRBC: 0 % (ref 0.0–0.2)

## 2020-04-09 LAB — GLUCOSE, CAPILLARY
Glucose-Capillary: 111 mg/dL — ABNORMAL HIGH (ref 70–99)
Glucose-Capillary: 130 mg/dL — ABNORMAL HIGH (ref 70–99)
Glucose-Capillary: 152 mg/dL — ABNORMAL HIGH (ref 70–99)
Glucose-Capillary: 154 mg/dL — ABNORMAL HIGH (ref 70–99)

## 2020-04-09 LAB — BASIC METABOLIC PANEL
Anion gap: 9 (ref 5–15)
BUN: <5 mg/dL — ABNORMAL LOW (ref 6–20)
CO2: 24 mmol/L (ref 22–32)
Calcium: 8.4 mg/dL — ABNORMAL LOW (ref 8.9–10.3)
Chloride: 110 mmol/L (ref 98–111)
Creatinine, Ser: 0.51 mg/dL (ref 0.44–1.00)
GFR, Estimated: >60 mL/min (ref >60)
Glucose, Bld: 114 mg/dL — ABNORMAL HIGH (ref 70–99)
Potassium: 3.3 mmol/L — ABNORMAL LOW (ref 3.5–5.1)
Sodium: 143 mmol/L (ref 135–145)

## 2020-04-09 LAB — CULTURE, BLOOD (ROUTINE X 2)
Culture: NO GROWTH
Culture: NO GROWTH
Special Requests: ADEQUATE

## 2020-04-09 LAB — SARS CORONAVIRUS 2 (TAT 6-24 HRS): SARS Coronavirus 2: NEGATIVE

## 2020-04-09 LAB — TSH: TSH: 0.941 u[IU]/mL (ref 0.350–4.500)

## 2020-04-09 MED ORDER — METOPROLOL TARTRATE 25 MG/10 ML ORAL SUSPENSION
25.0000 mg | Freq: Two times a day (BID) | ORAL | Status: DC
  Administered 2020-04-09 – 2020-04-11 (×5): 25 mg via ORAL
  Filled 2020-04-09 (×5): qty 10

## 2020-04-09 NOTE — Progress Notes (Signed)
TOC CM/CSW attempted to contact [facility] for pts return.  CSW left HIPPA compliant message with my contact information.  CSW will continue to follow for dc needs.  Kelina Beauchamp Tarpley-Carter, MSW, LCSW-A Pronouns:  She, Her, Hers                  [facility] Transitions of CareClinical Social Worker [email or web address] [phone]

## 2020-04-09 NOTE — Progress Notes (Signed)
TOC CM/CSW consulted with Dr. Waymon Amato.  Pt is not currently ready for dc, possibly tomorrow.  The following information is for [facility] return.  Call Report #:  [phone]  CSW will continue to follow for dc needs.  Ricquita Tarpley-Carter, MSW, LCSW-A Pronouns:  She, Her, Hers                  [facility] Transitions of CareClinical Social Worker [email or web address] [phone]

## 2020-04-09 NOTE — Progress Notes (Signed)
TOC CM/CSW spoke with Ronald Lobo, Director of [facility] [phone]. Marcelle Smiling will give CSW a return call about pts dc back to [place].  CSW will continue to follow for dc needs.  Jamya Starry Tarpley-Carter, MSW, LCSW-A Pronouns:  She, Her, Hers                  [facility] Transitions of CareClinical Social Worker [email or web address] [phone]

## 2020-04-09 NOTE — Progress Notes (Signed)
PROGRESS NOTE   BHAKTI LABELLA  ONG:[id number]    DOB: [birth year]    DOA: 04/04/2020  PCP: Patient, No Pcp Per   I have briefly reviewed patients previous medical records in [facility].  Chief Complaint  Patient presents with  . Failure To Thrive    Brief Narrative:  55 year old female, medical history including but not limited to pulmonary embolism, DVT, type II DM, epilepsy, GERD, HLD, HTN, nontraumatic subarachnoid hemorrhage, left hemiparesis, IBS, dysphagia,?  Nursing home resident, nonambulatory/wheelchair mobile, presented to the ED due to not eating or drinking for past 1 week PTA.  Admitted for sepsis due to aspiration pneumonia from pre-existing dysphagia, dehydration with hypernatremia.  Clinically improved.  Transferred to medical bed 3/10.  Ongoing poor oral intake/pocketing of food.  Also persistent sinus tachycardia, unclear etiology, lower extremity Dopplers and CTA negative for DVT and PE respectively, 2D echo shows moderate pericardial effusion without tamponade features, TSH normal.  Cardiology consulted and have started low-dose beta-blockers.   Assessment & Plan:  Principal Problem:   Sepsis due to pneumonia [facility]) Active Problems:   Seizures ([place])   DVT (deep venous thrombosis) (HCC)   Type II diabetes mellitus with neurological manifestations ([place])   Essential hypertension, benign   Chronic pulmonary embolism (HCC)   Dyslipidemia associated with type 2 diabetes mellitus (HCC)   Hypernatremia   Inappropriate sinus tachycardia   Hemiplegia as late effect of cerebrovascular accident (CVA) ([place])   Hypokalemia   Prolonged QT interval   Malnutrition of moderate degree   Aspiration pneumonia (HCC)   Severe sepsis, POA, secondary to aspiration pneumonia from pre-existing dysphagia:  Met sepsis criteria on admission including fever of 100.3 F, tachypnea in the 20s-occasionally low 30s, tachycardic and hypoxic at 89% on 10 L/min oxygen/NRB.  Neutrophilic  leukocytosis/WBC 14.3 on admission.  Lactate peaked to 6.7.  Flu panel and COVID-19 PCR negative.  Urine streptococcal antigen negative.  Blood cultures remain negative today.  MRSA PCR negative.  Urine microscopy not suggestive of UTI and culture showed multiple species.  HIV screen nonreactive.  S/p IV azithromycin x1 dose and ceftriaxone x1 dose on 3/7.  Then treated with IV Unasyn 3/7 >.  Completed approximately 3 days of IV antibiotics.  Although intermittent low-grade fevers, she looks good, not septic or toxic looking, tachycardia switched to Augmentin 3/10 x5 days.    Recommend follow-up of chest x-ray in 4 weeks.  CTA chest 3/11 shows dependent airspace disease on the right side suggestive of aspiration.  Improved.  Discussed with patient and son at bedside 3/11 that patient remains at high risk for recurrent aspiration due to underlying dysphagia issues and also cognitive impairment.  Sinus tachycardia/pericardial effusion  Asymptomatic.  Unclear etiology.  Likely multifactorial-pain at times, anemia.  Sepsis resolved.  Clinically euvolemic.  Given prior history of DVT/PE on Xarelto, bedbound status, lower extremity contractures, at risk for VTE's, checked D-dimer which is positive, bilateral lower extremity venous Dopplers negative for DVT and CTA chest negative for PE.  2D echo showed normal EF but moderate pericardial effusion without tamponade features.    At risk for rate related cardiomyopathy.  Cardiology follow-up appreciated.  Inappropriate sinus tachycardia.  Started metoprolol 25 mg twice daily and recommend repeating 2D echo in a couple of weeks to make sure that the pericardial effusion is not progressing.  As communicated with Cardiology, monitor overnight and if hemoglobin and heart rate stable, possible DC tomorrow.   Dysphagia  Chronic.?  Related to prior subarachnoid  hemorrhage.  Not sure if she had a CVA.  Speech therapy follow-up appreciated and have  advance diet to dysphagia 2 diet and thin liquids with which hopefully her oral intake will improve.  Dr. Domingo Cocking, palliative care MD and I had separate and detailed conversation with patient regarding PEG tube which she has repeatedly declined.  She reportedly had a PEG tube in the past which had to be removed due to leakage around the tube.  Speech therapy follow-up appreciated and recommend ongoing dysphagia 2 (fine chopped), dysphagia 1 (pured) and thin liquids.  Patient noted to be pocketing food.  Dehydration with hypernatremia  Secondary to poor oral intake.  Presented with serum sodium of 158.  Treated with hypotonic/half-normal saline.  Resolved.  Briefly hydrated again with IVF last night.  Now off IV fluids.  Will be at risk of recurrent dehydration if has inconsistent oral intake.  Hypokalemia  Replaced.  Follow BMP in a.m.  Magnesium up to 2.1.  Lactic acidosis:  Secondary to sepsis and acute respiratory failure.  Lactate peaked to 6.7.  Treated per sepsis protocol with aggressive IV fluids and improved.  Acute respiratory failure with hypoxia  Secondary to aspiration pneumonia  Resolved.  Seizure disorder  Continue prior home dose of Keppra.  Essential hypertension  Mildly uncontrolled at times  Dyslipidemia  Continue home rosuvastatin.  Type II DM  Well-controlled.  History of DVT/PE  Briefly on full dose Lovenox which has been switched back to her home dose of Xarelto.    Prolonged QTC  Improved, EKG 3/10: QTC 456 ms  Continue to replace low potassium.  Avoid QT prolonging medications.  Adult failure to thrive  Likely major component being poor oral intake related to dysphagia.  Palliative care MD input appreciated: Full code/full scope, family to complete MOST form when able, palliative care to continue to follow at SNF.  Anemia of chronic disease  Hemoglobin which was stable in the 10 g range has dropped to 8.9 today.  No  overt bleeding.  Suspect dilutional versus lab variation/error.  Follow CBC in a.m.  Thrombocytopenia  Possibly related to sepsis.  Resolved.  Diarrhea  Appears to have resolved.  Low index of suspicion for C. difficile.  Body mass index is 18.08 kg/m.  Nutritional Status Nutrition Problem: Moderate Malnutrition Etiology: chronic illness,dysphagia Signs/Symptoms: mild fat depletion,moderate muscle depletion Interventions: Refer to RD note for recommendations    DVT prophylaxis:   Full dose Lovenox   Code Status: Full Code Family Communication: On 3/11, I discussed in detail with patient's son in person at patient's bedside.  He then put his brother on speaker phone and we discussed in detail.  Answered all questions and concerns.  None at bedside today. Disposition:  Status is: Inpatient  Remains inpatient appropriate because:Inpatient level of care appropriate due to severity of illness   Dispo: The patient is from: LTC SNF at [facility]              Anticipated d/c is to: Return to LTC SNF at [facility], possibly 04/10/2020.              Patient currently is not medically stable to d/c.   Difficult to place patient No        Consultants:   Palliative care medicine Cardiology  Procedures:   None  Antimicrobials:    Anti-infectives (From admission, onward)   Start     Dose/Rate Route Frequency Ordered Stop   04/08/20 1030  amoxicillin-clavulanate (AUGMENTIN)  400-57 MG/5ML suspension 875 mg        875 mg Oral Every 12 hours 04/08/20 0935     04/07/20 2200  amoxicillin-clavulanate (AUGMENTIN) 875-125 MG per tablet 1 tablet  Status:  Discontinued        1 tablet Oral Every 12 hours 04/07/20 1417 04/08/20 0935   04/05/20 0400  Ampicillin-Sulbactam (UNASYN) 3 g in sodium chloride 0.9 % 100 mL IVPB  Status:  Discontinued        3 g 200 mL/hr over 30 Minutes Intravenous Every 8 hours 04/04/20 2047 04/07/20 1417   04/04/20 1700  cefTRIAXone (ROCEPHIN) 2 g in  sodium chloride 0.9 % 100 mL IVPB  Status:  Discontinued        2 g 200 mL/hr over 30 Minutes Intravenous Every 24 hours 04/04/20 1657 04/04/20 2026   04/04/20 1700  azithromycin (ZITHROMAX) 500 mg in sodium chloride 0.9 % 250 mL IVPB  Status:  Discontinued        500 mg 250 mL/hr over 60 Minutes Intravenous Every 24 hours 04/04/20 1657 04/04/20 2026        Subjective:  Denies complaints.  Specifically denies chest pain or dyspnea.  Some cough reported.  As per nursing, no acute issues noted  Objective:   Vitals:   04/08/20 2218 04/09/20 0224 04/09/20 0542 04/09/20 0929  BP: 111/76 (!) 133/97 108/64 (!) 140/102  Pulse: (!) 58 (!) 118 61 (!) 120  Resp: _0 Temp: 98.8 F (37.1 C) 98.5 F (36.9 C) 98.4 F (36.9 C) 97.7 F (36.5 C)  TempSrc: Oral Oral Oral Oral  SpO2: 95% 96% 94% 99%  Weight:      Height:        General exam: Young female, small built and frail, lying comfortably propped up in bed without distress.   ENT: As examined on 3/11: Pocketing food.  After she was made to spit out all of it there was still some left in the mouth.  She just has 4-5 front teeth on the top and below, difficult to clearly examine because of food residue but appears that her teeth require cleaning but did not really notice any acute findings or caries.  No other acute findings noted. Respiratory system: Slightly diminished breath sounds in the bases but poor inspiratory effort.  Rest of lung fields clear to auscultation.  No wheezing, rhonchi or crackles.  No increased work of breathing. Cardiovascular system: S1 and S2 heard, regular mild tachycardia.  No JVD, murmurs or pedal edema.  Telemetry personally reviewed: Sinus tachycardia this morning in the 120s-110s. Gastrointestinal system: Abdomen is nondistended, soft and nontender.  No organomegaly or masses appreciated.  Normal bowel sounds heard. Central nervous system: Alert but not saying much, has mouth closed with food in the  mouth.  Does follow simple instructions. Extremities: Grade 5 x 5 power in right upper extremity, 4+ by 5 left hand grip.  Bilateral lower extremity with significant contractures. Skin: No rashes, lesions or ulcers Psychiatry: Judgement and insight appear impaired. Mood & affect flat today.    Data Reviewed:   I have personally reviewed following labs and imaging studies   CBC: Recent Labs  Lab 04/04/20 1725 04/05/20 1012 04/06/20 0256 04/07/20 0430 04/09/20 0522  WBC 14.3* 13.8* 15.5* 12.7* 7.7  NEUTROABS 11.2* 10.8*  --   --   --   HGB 14.4 10.9* 10.6* 10.3* 8.9*  HCT 47.2* 36.7 35.0* 32.6* 29.0*  MCV 99.8 105.2* 101.2* 97.6  99.7  PLT 214 111* 115* 123* 983    Basic Metabolic Panel: Recent Labs  Lab 04/04/20 1725 04/05/20 1012 04/06/20 0256 04/07/20 0430 04/07/20 1133 04/08/20 0532 04/09/20 0522  NA 158*   < > 144 137 141 140 143  K 3.2*   < > 2.7* 2.9* 3.5 3.9 3.3*  CL 117*   < > 105 101 103 107 110  CO2 29   < > _0 GLUCOSE 165*   < > 82 101* 85 106* 114*  BUN 37*   < > 10 6 <5* <5* <5*  CREATININE 1.06*   < > 0.54 0.53 0.54 0.53 0.51  CALCIUM 9.9   < > 8.5* 8.0* 8.5* 8.7* 8.4*  MG 2.3  --  1.7 2.1  --   --   --    < > = values in this interval not displayed.    Liver Function Tests: Recent Labs  Lab 04/05/20 1012 04/06/20 0256 04/08/20 0532  AST _1 ALT _2 ALKPHOS 89 106 107  BILITOT 0.9 0.8 0.8  PROT 6.0* 6.5 6.6  ALBUMIN 2.8* 2.9* 2.7*    CBG: Recent Labs  Lab 04/09/20 0010 04/09/20 0622 04/09/20 1123  GLUCAP 152* 111* 154*    Microbiology Studies:   Recent Results (from the past 240 hour(s))  Blood culture (routine x 2)     Status: None (Preliminary result)   Collection Time: 04/04/20  5:25 PM   Specimen: BLOOD RIGHT FOREARM  Result Value Ref Range Status   Specimen Description   Final    BLOOD RIGHT FOREARM Performed at [facility], [place] [address], [place], [place] [address]     Special Requests   Final    BACTERIAL CASTS Blood Culture adequate volume Performed at [place] [address] [place], [place] [address]    Culture   Final    NO GROWTH 4 DAYS Performed at [facility], [place] [address], [place], [place] [address]    Report Status PENDING  Incomplete  Blood culture (routine x 2)     Status: None (Preliminary result)   Collection Time: 04/04/20  5:25 PM   Specimen: BLOOD LEFT FOREARM  Result Value Ref Range Status   Specimen Description   Final    BLOOD LEFT FOREARM Performed at [facility] [address], [place], [place] [address]    Special Requests   Final    BLOOD Blood Culture results may not be optimal due to an inadequate volume of blood received in culture bottles Performed at [place] [address], [place], [place] [address]    Culture   Final    NO GROWTH 4 DAYS Performed at [facility], [place] [address], [place], [place] [address]    Report Status PENDING  Incomplete  Resp Panel by RT-PCR (Flu A&B, Covid) Nasopharyngeal Swab     Status: None   Collection Time: 04/04/20  5:25 PM   Specimen: Nasopharyngeal Swab; Nasopharyngeal(NP) swabs in vial transport medium  Result Value Ref Range Status   SARS Coronavirus 2 by RT PCR NEGATIVE NEGATIVE Final    Comment: (NOTE) SARS-CoV-2 target nucleic acids are NOT DETECTED.  The SARS-CoV-2 RNA is generally detectable in upper respiratory specimens during the acute phase of infection. The lowest concentration of SARS-CoV-2 viral copies this assay can detect is 138 copies/mL. A negative result does not preclude  SARS-Cov-2 infection and should not be used as the sole basis for treatment or other patient management decisions. A negative result may occur with  improper specimen collection/handling, submission of specimen other than nasopharyngeal swab, presence of viral mutation(s) within the areas targeted by this  assay, and inadequate number of viral copies(<138 copies/mL). A negative result must be combined with clinical observations, patient history, and epidemiological information. The expected result is Negative.  Fact Sheet for Patients:  EntrepreneurPulse.com.au  Fact Sheet for Healthcare Providers:  IncredibleEmployment.be  This test is no t yet approved or cleared by the [place] FDA and  has been authorized for detection and/or diagnosis of SARS-CoV-2 by FDA under an Emergency Use Authorization (EUA). This EUA will remain  in effect (meaning this test can be used) for the duration of the COVID-19 declaration under Section 564(b)(1) of the Act, 21 U.S.C.section 360bbb-3(b)(1), unless the authorization is terminated  or revoked sooner.       Influenza A by PCR NEGATIVE NEGATIVE Final   Influenza B by PCR NEGATIVE NEGATIVE Final    Comment: (NOTE) The Xpert Xpress SARS-CoV-2/FLU/RSV plus assay is intended as an aid in the diagnosis of influenza from Nasopharyngeal swab specimens and should not be used as a sole basis for treatment. Nasal washings and aspirates are unacceptable for Xpert Xpress SARS-CoV-2/FLU/RSV testing.  Fact Sheet for Patients: EntrepreneurPulse.com.au  Fact Sheet for Healthcare Providers: IncredibleEmployment.be  This test is not yet approved or cleared by the [place] FDA and has been authorized for detection and/or diagnosis of SARS-CoV-2 by FDA under an Emergency Use Authorization (EUA). This EUA will remain in effect (meaning this test can be used) for the duration of the COVID-19 declaration under Section 564(b)(1) of the Act, 21 U.S.C. section 360bbb-3(b)(1), unless the authorization is terminated or revoked.  Performed at [facility], [place] [address], [place], [place] [address]   Urine culture     Status: Abnormal   Collection Time: 04/04/20 10:09  PM   Specimen: In/Out Cath Urine  Result Value Ref Range Status   Specimen Description   Final    IN/OUT CATH URINE Performed at [place] [address] [place], [place] [address]    Special Requests   Final    NONE Performed at [facility], [place] [address] [place], [place] [address]    Culture MULTIPLE SPECIES PRESENT, SUGGEST RECOLLECTION (A)  Final   Report Status 04/06/2020 FINAL  Final  MRSA PCR Screening     Status: None   Collection Time: 04/04/20 11:20 PM   Specimen: Nasopharyngeal  Result Value Ref Range Status   MRSA by PCR NEGATIVE NEGATIVE Final    Comment:        The GeneXpert MRSA Assay (FDA approved for NASAL specimens only), is one component of a comprehensive MRSA colonization surveillance program. It is not intended to diagnose MRSA infection nor to guide or monitor treatment for MRSA infections. Performed at [facility], [place] [address] [place], [place] [address]      Radiology Studies:  CT ANGIO CHEST PE W OR WO CONTRAST  Result Date: 04/08/2020 CLINICAL DATA:  Right upper chest pain, positive D-dimer, history of deep venous thrombosis EXAM: CT ANGIOGRAPHY CHEST WITH CONTRAST TECHNIQUE: Multidetector CT imaging of the chest was performed using the standard protocol during bolus administration of intravenous contrast. Multiplanar CT image reconstructions and MIPs were obtained to evaluate the vascular anatomy. CONTRAST:  11m OMNIPAQUE IOHEXOL 350 MG/ML  SOLN COMPARISON:  04/04/2020, 05/06/2017 FINDINGS: Cardiovascular: This is a technically adequate evaluation of the pulmonary vasculature. No filling defects or pulmonary emboli. Trace pericardial fluid. The heart is not enlarged. Normal caliber of the thoracic aorta without dissection. Minimal atherosclerosis. Mediastinum/Nodes: No enlarged mediastinal, hilar, or axillary lymph nodes. Thyroid gland, trachea, and esophagus demonstrate no significant  findings. Lungs/Pleura: Dependent areas of airspace disease are seen within the right upper and right lower lobes, corresponding to abnormality seen on recent chest x-ray. Findings could reflect infection or aspiration. No effusion or pneumothorax. Central airways are patent. Upper Abdomen: No acute abnormality. Musculoskeletal: No acute or destructive bony lesions. Reconstructed images demonstrate no additional findings. Review of the MIP images confirms the above findings. IMPRESSION: 1. No evidence of pulmonary embolus. 2. Dependent airspace disease within the right lung, consistent with infection or aspiration. 3.  Aortic Atherosclerosis (ICD10-I70.0). Electronically Signed   By: Randa Ngo M.D.   On: 04/08/2020 21:31   ECHOCARDIOGRAM COMPLETE  Result Date: 04/08/2020    ECHOCARDIOGRAM REPORT   Patient Name:   Rachel Vang Date of Exam: 04/08/2020 Medical Rec #:  [id number]          Height:       66.0 in Accession #:    [id number]         Weight:       112.0 lb Date of Birth:  [birth year]           BSA:          1.563 m Patient Age:    39 years           BP:           112/80 mmHg Patient Gender: F                  HR:           109 bpm. Exam Location:  Inpatient Procedure: 2D Echo, Cardiac Doppler and Color Doppler Indications:    R94.31 Abnormal EKG  History:        Patient has prior history of Echocardiogram examinations, most                 recent 05/08/2017. Risk Factors:Hypertension, Diabetes and                 Dyslipidemia. Pulmonary Embolism. GERD. Seizures.  Sonographer:    Tiffany Dance Referring Phys: [id number] Corrado Hymon D Devontre Siedschlag  Sonographer Comments: No subcostal window and Technically difficult study due to poor echo windows. IMPRESSIONS  1. Moderate pericardial effusion. The pericardial effusion is anterior to the right ventricle. Mild RV diastolic compression without RV diastolic collapse. Unable to assess IVC or hepatic veins. Respirometer not performed. Study is suboptimal for assessment of  constricitive physiology. Tachycardia noted. Consider repeat echo in 2-3 days or with clinical deterioration.  2. Left ventricular ejection fraction, by estimation, is 60 to 65%. The left ventricle has normal function. Left ventricular endocardial border not optimally defined to evaluate regional wall motion. Indeterminate diastolic filling due to E-A fusion.  3. Right ventricular systolic function is normal. The right ventricular size is normal. Tricuspid regurgitation signal is inadequate for assessing PA pressure.  4. The mitral valve is normal in structure. No evidence of mitral valve regurgitation. No evidence of mitral stenosis.  5. The aortic valve was not well visualized. Aortic valve regurgitation is not visualized. No aortic stenosis is present. FINDINGS  Left Ventricle: Left ventricular ejection fraction, by estimation, is 60 to  65%. The left ventricle has normal function. Left ventricular endocardial border not optimally defined to evaluate regional wall motion. The left ventricular internal cavity size was normal in size. There is no left ventricular hypertrophy. Indeterminate diastolic filling due to E-A fusion. Right Ventricle: The right ventricular size is normal. No increase in right ventricular wall thickness. Right ventricular systolic function is normal. Tricuspid regurgitation signal is inadequate for assessing PA pressure. Left Atrium: Left atrial size was normal in size. Right Atrium: Right atrial size was normal in size. Pericardium: A moderately sized pericardial effusion is present. The pericardial effusion is anterior to the right ventricle. Presence of pericardial fat pad. Mitral Valve: The mitral valve is normal in structure. No evidence of mitral valve regurgitation. No evidence of mitral valve stenosis. Tricuspid Valve: The tricuspid valve is normal in structure. Tricuspid valve regurgitation is trivial. No evidence of tricuspid stenosis. Aortic Valve: The aortic valve was not well  visualized. Aortic valve regurgitation is not visualized. No aortic stenosis is present. Pulmonic Valve: The pulmonic valve was not well visualized. Pulmonic valve regurgitation is not visualized. No evidence of pulmonic stenosis. Aorta: The aortic root is normal in size and structure. Venous: The inferior vena cava was not well visualized. IAS/Shunts: The interatrial septum was not well visualized.  LEFT VENTRICLE PLAX 2D LVIDd:         4.18 cm  Diastology LVIDs:         3.41 cm  LV e' medial:    7.21 cm/s LV PW:         1.11 cm  LV E/e' medial:  11.2 LV IVS:        0.94 cm  LV e' lateral:   5.72 cm/s LVOT diam:     2.20 cm  LV E/e' lateral: 14.2 LV SV:         34 LV SV Index:   22 LVOT Area:     3.80 cm  RIGHT VENTRICLE RV Basal diam:  1.71 cm RV S prime:     12.10 cm/s TAPSE (M-mode): 1.9 cm LEFT ATRIUM         Index LA diam:    2.90 cm 1.86 cm/m  AORTIC VALVE LVOT Vmax:   57.80 cm/s LVOT Vmean:  37.200 cm/s LVOT VTI:    0.089 m  AORTA Ao Root diam: 3.30 cm MITRAL VALVE MV Area (PHT): 3.42 cm    SHUNTS MV Decel Time: 222 msec    Systemic VTI:  0.09 m MV E velocity: 81.10 cm/s  Systemic Diam: 2.20 cm Cherlynn Kaiser MD Electronically signed by Cherlynn Kaiser MD Signature Date/Time: 04/08/2020/3:48:08 PM    Final    VAS [place] LOWER EXTREMITY VENOUS (DVT)  Result Date: 04/08/2020  Lower Venous DVT Study Indications: Tachycardia.  Risk Factors: None identified. Limitations: Poor ultrasound/tissue interface and patient positioning, patient immobility, patient pain tolerance, severe leg contractures. Comparison Study: No prior studies. Performing Technologist: Oliver Hum RVT  Examination Guidelines: A complete evaluation includes B-mode imaging, spectral Doppler, color Doppler, and power Doppler as needed of all accessible portions of each vessel. Bilateral testing is considered an integral part of a complete examination. Limited examinations for reoccurring indications may be performed as noted. The reflux  portion of the exam is performed with the patient in reverse Trendelenburg.  +---------+---------------+---------+-----------+----------+-------------------+ RIGHT    CompressibilityPhasicitySpontaneityPropertiesThrombus Aging      +---------+---------------+---------+-----------+----------+-------------------+ CFV      Full           Yes  Yes                                      +---------+---------------+---------+-----------+----------+-------------------+ SFJ      Full                                                             +---------+---------------+---------+-----------+----------+-------------------+ FV Prox  Full                                                             +---------+---------------+---------+-----------+----------+-------------------+ FV Mid   Full                                                             +---------+---------------+---------+-----------+----------+-------------------+ FV DistalFull                                                             +---------+---------------+---------+-----------+----------+-------------------+ PFV                                                   Not well visualized +---------+---------------+---------+-----------+----------+-------------------+ POP      Full           No       No                                       +---------+---------------+---------+-----------+----------+-------------------+ PTV      Full                                                             +---------+---------------+---------+-----------+----------+-------------------+ PERO     Full                                                             +---------+---------------+---------+-----------+----------+-------------------+   +---------+---------------+---------+-----------+----------+-------------------+ LEFT     CompressibilityPhasicitySpontaneityPropertiesThrombus Aging       +---------+---------------+---------+-----------+----------+-------------------+ CFV      Full           Yes      Yes                                      +---------+---------------+---------+-----------+----------+-------------------+  SFJ      Full                                                             +---------+---------------+---------+-----------+----------+-------------------+ FV Prox  Full                                                             +---------+---------------+---------+-----------+----------+-------------------+ FV Mid   Full                                                             +---------+---------------+---------+-----------+----------+-------------------+ FV DistalFull                                                             +---------+---------------+---------+-----------+----------+-------------------+ PFV                                                   Not well visualized +---------+---------------+---------+-----------+----------+-------------------+ POP                     No       No                                       +---------+---------------+---------+-----------+----------+-------------------+ PTV      Full                                                             +---------+---------------+---------+-----------+----------+-------------------+ PERO     Full                                                             +---------+---------------+---------+-----------+----------+-------------------+     Summary: RIGHT: - There is no evidence of deep vein thrombosis in the lower extremity. However, portions of this examination were limited- see technologist comments above.  - No cystic structure found in the popliteal fossa.  LEFT: - There is no evidence of deep vein thrombosis in the lower extremity. However, portions of this examination were limited- see technologist comments above.  - No cystic  structure found in the popliteal fossa.  *See table(s)  above for measurements and observations.    Preliminary      Scheduled Meds:   . amoxicillin-clavulanate  875 mg Oral Q12H  . bethanechol  10 mg Oral TID  . Chlorhexidine Gluconate Cloth  6 each Topical Daily  . feeding supplement  237 mL Oral TID BM  . gabapentin  200 mg Oral Q12H  . levETIRAcetam  750 mg Oral BID  . mouth rinse  15 mL Mouth Rinse BID  . metoprolol tartrate  25 mg Oral BID  . mirtazapine  15 mg Oral QHS  . rivaroxaban  20 mg Oral Q supper  . rosuvastatin  5 mg Oral Daily  . sertraline  50 mg Oral Daily  . traZODone  100 mg Oral QHS    Continuous Infusions:      LOS: 4 days     Vernell Leep, MD, [place], [facility]. [facility]    To contact the attending provider between 7A-7P or the covering provider during after hours 7P-7A, please log into the web site [email or web address] and access using universal [place] password for that web site. If you do not have the password, please call the hospital operator.  04/09/2020, 1:05 PM

## 2020-04-09 NOTE — Progress Notes (Signed)
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  Option of PEG tube declined by patient.  Nurse expressed difficulty with patient swallowing crushed medications this morning.   Plan:  Xarelto tablets may be crushed and mixed with applesauce immediately prior to use; immediately follow administration of 20 mg tablets with food.  If patient unable to tolerate crushed Xarelto tablets in applesauce, a possible option is having Xarelto 1 mg/ml suspension ordered but is not stocked on site or at [facility] and would have to be ordered, availability from distributor who is currently closed unknown.  Rachel Vang, PharmD, BCPS Clinical Pharmacist [place] Please utilize Amion for appropriate phone number to reach the unit pharmacist [facility]) 04/09/2020 6:26 PM

## 2020-04-09 NOTE — Progress Notes (Signed)
   04/09/20 0224  Assess: MEWS Score  Temp 98.5 F (36.9 C)  BP (!) 133/97  Pulse Rate (!) 118  Resp 20  Level of Consciousness Alert  SpO2 96 %  O2 Device Room Air  Patient Activity (if Appropriate) In bed  Assess: MEWS Score  MEWS Temp 0  MEWS Systolic 0  MEWS Pulse 2  MEWS RR 0  MEWS LOC 0  MEWS Score 2  MEWS Score Color Yellow  Assess: if the MEWS score is Yellow or Red  Were vital signs taken at a resting state? Yes  Focused Assessment No change from prior assessment  Early Detection of Sepsis Score *See Row Information* Low  MEWS guidelines implemented *See Row Information* No, previously yellow, continue vital signs every 4 hours  Notify: Charge Nurse/RN  Name of Charge Nurse/RN Notified Karie Schwalbe, RN  Date Charge Nurse/RN Notified 04/09/20  Time Charge Nurse/RN Notified [phone]

## 2020-04-09 NOTE — Consult Note (Signed)
Cardiology Consultation:   Patient ID: Rachel Vang MRN: [id number]; DOB: Oct 13, 1965  Admit date: 04/04/2020 Date of Consult: 04/09/2020  PCP:  Patient, No Pcp Per   [facility]  Cardiologist:  No primary care provider on file.  Advanced Practice Provider:  No care team member to [id number]    Patient Profile:   Rachel Vang is a 55 y.o. female with a hx of hypertension, hyperlipidemia, subarachnoid hemorrhage, left hemiparesis, diabetes, epilepsy, and DVT who is being seen today for the evaluation of tachycardia and pericardial effusion at the request of Dr. Waymon Amato.  History of Present Illness:   Rachel Vang was admitted with sepsis and aspiration pneumonia.  This was thought to be due to to her chronic dysphagia.  She improved with IV antibiotics.  However she was noted to have a persistent sinus tachycardia after her sepsis was resolved.  It was thought that it may be attributable to pain at times.  A D-dimer was checked which was positive.  She subsequently had a CT-A of the chest that was negative for pulmonary embolism.  TSH was normal.  An echocardiogram was obtained which revealed LVEF 60 to 65% with a moderate pericardial effusion but no tamponade.  She was noted to have a mild pericardial effusion and an echo performed 04/2017.  There was no evidence of tamponade at that time.  During her hospitalization, palliative care was consulted and family elected to continue full scope of care.  On review of her chart it seems that her heart rate typically is in the 70s.  She currently denies chest pain or palpitations.  She is bedbound and therefore cannot assess lightheadedness.  She has shortness of breath but denies orthopnea or PND.  hgb drop  Past Medical History:  Diagnosis Date  . Acute pulmonary embolism (HCC) 02/19/2015  . Acute respiratory failure (HCC)   . Diabetes mellitus without complication (HCC)    Type 2, W/o complications  . DVT  (deep venous thrombosis) (HCC) 04/05/2015  . Dysphagia   . Epilepsy (HCC)   . GERD (gastroesophageal reflux disease)   . Hyperlipidemia   . Hypertension   . IBS (irritable bowel syndrome)   . Nontraumatic subarachnoid hemorrhage (HCC)   . SAH (subarachnoid hemorrhage) (HCC)   . Urinary retention     Past Surgical History:  Procedure Laterality Date  . ABDOMINAL SURGERY    . ANEURYSM COILING    . COLONOSCOPY N/A 02/20/2015   Procedure: COLONOSCOPY;  Surgeon: Iva Boop, MD;  Location: [facility] ENDOSCOPY;  Service: Endoscopy;  Laterality: N/A;  . ESOPHAGOGASTRODUODENOSCOPY (EGD) WITH PROPOFOL N/A 02/02/2015   Procedure: ESOPHAGOGASTRODUODENOSCOPY (EGD) WITH PROPOFOL;  Surgeon: Jimmye Norman, MD;  Location: [facility] ENDOSCOPY;  Service: General;  Laterality: N/A;  . IR GENERIC HISTORICAL  08/26/2015   IR GASTRIC TUBE PERC CHG W/O IMG GUIDE 08/26/2015 Irish Lack, MD [facility]-INTERV RAD  . IR GENERIC HISTORICAL  09/14/2015   IR REPLC GASTRO/COLONIC TUBE PERCUT W/FLUORO 09/14/2015 Darrell K Allred, PA-C [facility]-INTERV RAD  . IR REPLACE G-TUBE SIMPLE WO FLUORO  06/08/2016  . IR REPLACE G-TUBE SIMPLE WO FLUORO  10/24/2016  . IR REPLACE G-TUBE SIMPLE WO FLUORO  01/07/2017  . PEG PLACEMENT N/A 02/02/2015   Procedure: PERCUTANEOUS ENDOSCOPIC GASTROSTOMY (PEG) PLACEMENT;  Surgeon: Jimmye Norman, MD;  Location: [facility] ENDOSCOPY;  Service: General;  Laterality: N/A;  . RADIOLOGY WITH ANESTHESIA N/A 01/13/2015   Procedure: RADIOLOGY WITH ANESTHESIA;  Surgeon: Lisbeth Renshaw, MD;  Location: [facility] OR;  Service: Radiology;  Laterality: N/A;  . TRACHEOSTOMY       Home Medications:  Prior to Admission medications   Medication Sig Start Date End Date Taking? Authorizing Provider  acetaminophen (TYLENOL) 325 MG tablet Take 650 mg by mouth every 6 (six) hours.   Yes [provider]  bethanechol (URECHOLINE) 10 MG tablet Take 10 mg by mouth 3 (three) times daily. 09/08/17  Yes [provider]  docusate sodium (COLACE)  100 MG capsule Take 100 mg by mouth daily.   Yes [provider]  gabapentin (NEURONTIN) 100 MG capsule Take 200 mg by mouth 2 (two) times daily. 09/08/17  Yes Ellwood Sayers, MD  guaiFENesin (MUCINEX) 600 MG 12 hr tablet Take 600 mg by mouth every 6 (six) hours as needed for cough.   Yes [provider]  levETIRAcetam (KEPPRA) 100 MG/ML solution Place 5 mLs (500 mg total) into feeding tube 2 (two) times daily. Patient taking differently: Take 750 mg by mouth 2 (two) times daily. 02/11/15  Yes Leroy Sea, MD  mirtazapine (REMERON) 15 MG tablet Take 15 mg by mouth at bedtime.   Yes [provider]  NON FORMULARY Apply 1 application topically 2 (two) times daily. Hand palm protector   Yes [provider]  Nutritional Supplements (NUTRITIONAL SUPPLEMENT PO) Take 237 mLs by mouth 2 (two) times daily. Ensure   Yes [provider]  polyethylene glycol (MIRALAX / GLYCOLAX) packet Take 17 g by mouth daily.   Yes [provider]  Probiotic Product (PROBIOTIC DAILY PO) Take 1 capsule by mouth 2 (two) times daily. 09/08/17  Yes [provider]  rivaroxaban (XARELTO) 20 MG TABS tablet Take 20 mg by mouth at bedtime.   Yes [provider]  rosuvastatin (CRESTOR) 5 MG tablet Take 5 mg by mouth daily. 09/24/17  Yes [provider]  sertraline (ZOLOFT) 50 MG tablet Take 50 mg by mouth daily.   Yes [provider]  traZODone (DESYREL) 100 MG tablet Take 100 mg by mouth at bedtime.   Yes [provider]  urea (CARMOL) 40 % CREA Apply 1 application topically daily. To both feet   Yes [provider]    Inpatient Medications: Scheduled Meds: . amoxicillin-clavulanate  875 mg Oral Q12H  . bethanechol  10 mg Oral TID  . Chlorhexidine Gluconate Cloth  6 each Topical Daily  . feeding supplement  237 mL Oral TID BM  . gabapentin  200 mg Oral Q12H  . levETIRAcetam  750 mg Oral BID  . mouth rinse  15 mL  Mouth Rinse BID  . mirtazapine  15 mg Oral QHS  . rivaroxaban  20 mg Oral Q supper  . rosuvastatin  5 mg Oral Daily  . sertraline  50 mg Oral Daily  . traZODone  100 mg Oral QHS   Continuous Infusions:  PRN Meds: acetaminophen **OR** acetaminophen, morphine injection, prochlorperazine  Allergies:   No Known Allergies  Social History:   Social History   Socioeconomic History  . Marital status: Single    Spouse name: Not on file  . Number of children: Not on file  . Years of education: Not on file  . Highest education level: Not on file  Occupational History  . Not on file  Tobacco Use  . Smoking status: Former Games developer  . Smokeless tobacco: Never Used  Substance and Sexual Activity  . Alcohol use: No  . Drug use: No  . Sexual activity: Not on file  Other Topics Concern  . Not on file  Social History Narrative  . Not on file   Social Determinants of Health   Financial Resource Strain: Not on file  Food Insecurity: Not on file  Transportation Needs: Not on file  Physical Activity: Not on file  Stress: Not on file  Social Connections: Not on file  Intimate Partner Violence: Not on file    Family History:    Family History  Problem Relation Age of Onset  . Hypertension Other      ROS:  Please see the history of present illness.   All other ROS reviewed and negative.     Physical Exam/Data:   Vitals:   04/08/20 2218 04/09/20 0224 04/09/20 0542 04/09/20 0929  BP: 111/76 (!) 133/97 108/64 (!) 140/102  Pulse: (!) 58 (!) 118 61 (!) 120  Resp: Temp: 98.8 F (37.1 C) 98.5 F (36.9 C) 98.4 F (36.9 C) 97.7 F (36.5 C)  TempSrc: Oral Oral Oral Oral  SpO2: 95% 96% 94% 99%  Weight:      Height:        Intake/Output Summary (Last 24 hours) at 04/09/2020 0952 Last data filed at 04/09/2020 0600 Gross per 24 hour  Intake 1251.71 ml  Output --  Net 1251.71 ml   Last 3 Weights 04/04/2020 04/04/2019 11/19/2017  Weight (lbs) 111 lb 15.9 oz 112 lb 117  lb  Weight (kg) 50.8 kg 50.803 kg 53.071 kg     VS:  BP (!) 140/102 (BP Location: Left Arm)   Pulse (!) 120   Temp 97.7 F (36.5 C) (Oral)   Resp 15   Ht  (1.676 m)   Wt 50.8 kg   LMP  (LMP Unknown)   SpO2 99%   BMI 18.08 kg/m  , BMI Body mass index is 18.08 kg/m. GENERAL: Chronically ill-appearing HEENT: Pupils equal round and reactive, fundi not visualized, oral mucosa unremarkable NECK:  No jugular venous distention, waveform within normal limits, carotid upstroke brisk and symmetric, no bruits LUNGS:  Clear to auscultation bilaterally HEART: Tachycardic.  Regular rate.  PMI not displaced or sustained,S1 and S2 within normal limits, no S3, no S4, no clicks, no rubs, no murmurs ABD:  Flat, positive bowel sounds normal in frequency in pitch, no bruits, no rebound, no guarding, no midline pulsatile mass, no hepatomegaly, no splenomegaly EXT:  2 plus pulses throughout, +pedal edema, no cyanosis no clubbing SKIN:  No rashes no nodules NEURO: L sided weakness.  Contractures PSYCH:  Pleasant affect.  Difficult to assess.   EKG:  The EKG was personally reviewed and demonstrates: 04/07/2020: Sinus tachycardia.  Rate 124 bpm.  PVCs.  Telemetry:  Telemetry was personally reviewed and demonstrates: Sinus tachycardia.  Rate 110s to 130s  Relevant CV Studies:  Echo 04/08/20: 1. Moderate pericardial effusion. The pericardial effusion is anterior to  the right ventricle. Mild RV diastolic compression without RV diastolic  collapse. Unable to assess IVC or hepatic veins. Respirometer not  performed. Study is suboptimal for  assessment of constricitive physiology. Tachycardia noted. Consider repeat  echo in 2-3 days or with clinical deterioration.  2. Left ventricular ejection fraction, by estimation, is 60 to 65%. The  left ventricle has normal function. Left ventricular endocardial border  not optimally defined to evaluate regional wall motion. Indeterminate  diastolic filling due to  E-A fusion.  3. Right ventricular systolic function is normal. The right ventricular  size is normal. Tricuspid regurgitation signal is  inadequate for assessing  PA pressure.  4. The mitral valve is normal in structure. No evidence of mitral valve  regurgitation. No evidence of mitral stenosis.  5. The aortic valve was not well visualized. Aortic valve regurgitation  is not visualized. No aortic stenosis is present.    Laboratory Data:  High Sensitivity Troponin:  No results for input(s): TROPONINIHS in the last 720 hours.   Chemistry Recent Labs  Lab 04/07/20 1133 04/08/20 0532 04/09/20 0522  NA 141 140 143  K 3.5 3.9 3.3*  CL 103 107 110  CO2 GLUCOSE 85 106* 114*  BUN <5* <5* <5*  CREATININE 0.54 0.53 0.51  CALCIUM 8.5* 8.7* 8.4*  GFRNONAA >60 >60 >60  ANIONGAP Recent Labs  Lab 04/05/20 1012 04/06/20 0256 04/08/20 0532  PROT 6.0* 6.5 6.6  ALBUMIN 2.8* 2.9* 2.7*  AST ALT ALKPHOS 89 106 107  BILITOT 0.9 0.8 0.8   Hematology Recent Labs  Lab 04/06/20 0256 04/07/20 0430 04/09/20 0522  WBC 15.5* 12.7* 7.7  RBC 3.46* 3.34* 2.91*  HGB 10.6* 10.3* 8.9*  HCT 35.0* 32.6* 29.0*  MCV 101.2* 97.6 99.7  MCH 30.6 30.8 30.6  MCHC 30.3 31.6 30.7  RDW 15.4 14.8 15.8*  PLT 115* 123* 171   BNPNo results for input(s): BNP, PROBNP in the last 168 hours.  DDimer  Recent Labs  Lab 04/08/20 1120  DDIMER 0.64*     Radiology/Studies:  CT ANGIO CHEST PE W OR WO CONTRAST  Result Date: 04/08/2020 CLINICAL DATA:  Right upper chest pain, positive D-dimer, history of deep venous thrombosis EXAM: CT ANGIOGRAPHY CHEST WITH CONTRAST TECHNIQUE: Multidetector CT imaging of the chest was performed using the standard protocol during bolus administration of intravenous contrast. Multiplanar CT image reconstructions and MIPs were obtained to evaluate the vascular anatomy. CONTRAST:  75mL OMNIPAQUE IOHEXOL 350 MG/ML SOLN COMPARISON:  04/04/2020,  05/06/2017 FINDINGS: Cardiovascular: This is a technically adequate evaluation of the pulmonary vasculature. No filling defects or pulmonary emboli. Trace pericardial fluid. The heart is not enlarged. Normal caliber of the thoracic aorta without dissection. Minimal atherosclerosis. Mediastinum/Nodes: No enlarged mediastinal, hilar, or axillary lymph nodes. Thyroid gland, trachea, and esophagus demonstrate no significant findings. Lungs/Pleura: Dependent areas of airspace disease are seen within the right upper and right lower lobes, corresponding to abnormality seen on recent chest x-ray. Findings could reflect infection or aspiration. No effusion or pneumothorax. Central airways are patent. Upper Abdomen: No acute abnormality. Musculoskeletal: No acute or destructive bony lesions. Reconstructed images demonstrate no additional findings. Review of the MIP images confirms the above findings. IMPRESSION: 1. No evidence of pulmonary embolus. 2. Dependent airspace disease within the right lung, consistent with infection or aspiration. 3.  Aortic Atherosclerosis (ICD10-I70.0). Electronically Signed   By: Sharlet Salina M.D.   On: 04/08/2020 21:31   ECHOCARDIOGRAM COMPLETE  Result Date: 04/08/2020    ECHOCARDIOGRAM REPORT   Patient Name:   Rachel Vang Date of Exam: 04/08/2020 Medical Rec #:  [id number]          Height:       66.0 in Accession #:    [id number]         Weight:       112.0 lb Date of Birth:  12-08-1965           BSA:          1.563 m Patient Age:  54 years           BP:           112/80 mmHg Patient Gender: F                  HR:           109 bpm. Exam Location:  Inpatient Procedure: 2D Echo, Cardiac Doppler and Color Doppler Indications:    R94.31 Abnormal EKG  History:        Patient has prior history of Echocardiogram examinations, most                 recent 05/08/2017. Risk Factors:Hypertension, Diabetes and                 Dyslipidemia. Pulmonary Embolism. GERD. Seizures.  Sonographer:     Rector Devonshire Dance Referring Phys: [id number] ANAND D HONGALGI  Sonographer Comments: No subcostal window and Technically difficult study due to poor echo windows. IMPRESSIONS  1. Moderate pericardial effusion. The pericardial effusion is anterior to the right ventricle. Mild RV diastolic compression without RV diastolic collapse. Unable to assess IVC or hepatic veins. Respirometer not performed. Study is suboptimal for assessment of constricitive physiology. Tachycardia noted. Consider repeat echo in 2-3 days or with clinical deterioration.  2. Left ventricular ejection fraction, by estimation, is 60 to 65%. The left ventricle has normal function. Left ventricular endocardial border not optimally defined to evaluate regional wall motion. Indeterminate diastolic filling due to E-A fusion.  3. Right ventricular systolic function is normal. The right ventricular size is normal. Tricuspid regurgitation signal is inadequate for assessing PA pressure.  4. The mitral valve is normal in structure. No evidence of mitral valve regurgitation. No evidence of mitral stenosis.  5. The aortic valve was not well visualized. Aortic valve regurgitation is not visualized. No aortic stenosis is present. FINDINGS  Left Ventricle: Left ventricular ejection fraction, by estimation, is 60 to 65%. The left ventricle has normal function. Left ventricular endocardial border not optimally defined to evaluate regional wall motion. The left ventricular internal cavity size was normal in size. There is no left ventricular hypertrophy. Indeterminate diastolic filling due to E-A fusion. Right Ventricle: The right ventricular size is normal. No increase in right ventricular wall thickness. Right ventricular systolic function is normal. Tricuspid regurgitation signal is inadequate for assessing PA pressure. Left Atrium: Left atrial size was normal in size. Right Atrium: Right atrial size was normal in size. Pericardium: A moderately sized pericardial effusion  is present. The pericardial effusion is anterior to the right ventricle. Presence of pericardial fat pad. Mitral Valve: The mitral valve is normal in structure. No evidence of mitral valve regurgitation. No evidence of mitral valve stenosis. Tricuspid Valve: The tricuspid valve is normal in structure. Tricuspid valve regurgitation is trivial. No evidence of tricuspid stenosis. Aortic Valve: The aortic valve was not well visualized. Aortic valve regurgitation is not visualized. No aortic stenosis is present. Pulmonic Valve: The pulmonic valve was not well visualized. Pulmonic valve regurgitation is not visualized. No evidence of pulmonic stenosis. Aorta: The aortic root is normal in size and structure. Venous: The inferior vena cava was not well visualized. IAS/Shunts: The interatrial septum was not well visualized.  LEFT VENTRICLE PLAX 2D LVIDd:         4.18 cm  Diastology LVIDs:         3.41 cm  LV e' medial:    7.21 cm/s LV PW:  1.11 cm  LV E/e' medial:  11.2 LV IVS:        0.94 cm  LV e' lateral:   5.72 cm/s LVOT diam:     2.20 cm  LV E/e' lateral: 14.2 LV SV:         34 LV SV Index:   22 LVOT Area:     3.80 cm  RIGHT VENTRICLE RV Basal diam:  1.71 cm RV S prime:     12.10 cm/s TAPSE (M-mode): 1.9 cm LEFT ATRIUM         Index LA diam:    2.90 cm 1.86 cm/m  AORTIC VALVE LVOT Vmax:   57.80 cm/s LVOT Vmean:  37.200 cm/s LVOT VTI:    0.089 m  AORTA Ao Root diam: 3.30 cm MITRAL VALVE MV Area (PHT): 3.42 cm    SHUNTS MV Decel Time: 222 msec    Systemic VTI:  0.09 m MV E velocity: 81.10 cm/s  Systemic Diam: 2.20 cm Weston Brass MD Electronically signed by Weston Brass MD Signature Date/Time: 04/08/2020/3:48:08 PM    Final    VAS [place] LOWER EXTREMITY VENOUS (DVT)  Result Date: 04/08/2020  Lower Venous DVT Study Indications: Tachycardia.  Risk Factors: None identified. Limitations: Poor ultrasound/tissue interface and patient positioning, patient immobility, patient pain tolerance, severe leg  contractures. Comparison Study: No prior studies. Performing Technologist: Chanda Busing RVT  Examination Guidelines: A complete evaluation includes B-mode imaging, spectral Doppler, color Doppler, and power Doppler as needed of all accessible portions of each vessel. Bilateral testing is considered an integral part of a complete examination. Limited examinations for reoccurring indications may be performed as noted. The reflux portion of the exam is performed with the patient in reverse Trendelenburg.  +---------+---------------+---------+-----------+----------+-------------------+ RIGHT    CompressibilityPhasicitySpontaneityPropertiesThrombus Aging      +---------+---------------+---------+-----------+----------+-------------------+ CFV      Full           Yes      Yes                                      +---------+---------------+---------+-----------+----------+-------------------+ SFJ      Full                                                             +---------+---------------+---------+-----------+----------+-------------------+ FV Prox  Full                                                             +---------+---------------+---------+-----------+----------+-------------------+ FV Mid   Full                                                             +---------+---------------+---------+-----------+----------+-------------------+ FV DistalFull                                                             +---------+---------------+---------+-----------+----------+-------------------+  PFV                                                   Not well visualized +---------+---------------+---------+-----------+----------+-------------------+ POP      Full           No       No                                       +---------+---------------+---------+-----------+----------+-------------------+ PTV      Full                                                              +---------+---------------+---------+-----------+----------+-------------------+ PERO     Full                                                             +---------+---------------+---------+-----------+----------+-------------------+   +---------+---------------+---------+-----------+----------+-------------------+ LEFT     CompressibilityPhasicitySpontaneityPropertiesThrombus Aging      +---------+---------------+---------+-----------+----------+-------------------+ CFV      Full           Yes      Yes                                      +---------+---------------+---------+-----------+----------+-------------------+ SFJ      Full                                                             +---------+---------------+---------+-----------+----------+-------------------+ FV Prox  Full                                                             +---------+---------------+---------+-----------+----------+-------------------+ FV Mid   Full                                                             +---------+---------------+---------+-----------+----------+-------------------+ FV DistalFull                                                             +---------+---------------+---------+-----------+----------+-------------------+ PFV  Not well visualized +---------+---------------+---------+-----------+----------+-------------------+ POP                     No       No                                       +---------+---------------+---------+-----------+----------+-------------------+ PTV      Full                                                             +---------+---------------+---------+-----------+----------+-------------------+ PERO     Full                                                              +---------+---------------+---------+-----------+----------+-------------------+     Summary: RIGHT: - There is no evidence of deep vein thrombosis in the lower extremity. However, portions of this examination were limited- see technologist comments above.  - No cystic structure found in the popliteal fossa.  LEFT: - There is no evidence of deep vein thrombosis in the lower extremity. However, portions of this examination were limited- see technologist comments above.  - No cystic structure found in the popliteal fossa.  *See table(s) above for measurements and observations.    Preliminary      Assessment and Plan:   # Sinus tachycardia: # Pericardial effusion:  # Hypertension:  Initially her sinus tachycardia was appropriate in the setting of intravascular volume depletion (serum sodium 158) and sepsis.  However both have been resolved.  Agree, that pain from contractures likely does contribute somewhat.  She also has some chronic anemia.  Her hemoglobin was initially 14, though this was likely hemoconcentrated.  Recently it has been stable in the tens.  Today did drop to 8.9.  No obvious sign of bleeding.  The underlying cause of her tachycardia is unclear.  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  She has normal systolic function on echo.  Therefore there is no urgent need to treat it.  She does have a pericardial effusion that does not seem to be consistent with tamponade.  Would repeat the echo in a couple weeks to make sure it is not progressing.  We will start metoprolol 25 mg twice daily, which should also help with her mildly elevated blood pressures.  # Prior DVT/PE:  Negative for PE this admission.  Continue Xarelto.   Risk Assessment/Risk Scores:                For questions or updates, please contact [facility] Please consult [email or web address] for contact info under    Signed, Chilton Si, MD  04/09/2020 9:52 AM

## 2020-04-09 NOTE — Plan of Care (Signed)
°  Problem: Education: °Goal: Knowledge of General Education information will improve °Description: Including pain rating scale, medication(s)/side effects and non-pharmacologic comfort measures °Outcome: Progressing °  °Problem: Coping: °Goal: Level of anxiety will decrease °Outcome: Progressing °  °Problem: Pain Managment: °Goal: General experience of comfort will improve °Outcome: Progressing °  °Problem: Safety: °Goal: Ability to remain free from injury will improve °Outcome: Progressing °  °Problem: Skin Integrity: °Goal: Risk for impaired skin integrity will decrease °Outcome: Progressing °  °Problem: Respiratory: °Goal: Ability to maintain adequate ventilation will improve °Outcome: Progressing °  °

## 2020-04-10 DIAGNOSIS — R1310 Dysphagia, unspecified: Secondary | ICD-10-CM

## 2020-04-10 LAB — GLUCOSE, CAPILLARY
Glucose-Capillary: 119 mg/dL — ABNORMAL HIGH (ref 70–99)
Glucose-Capillary: 128 mg/dL — ABNORMAL HIGH (ref 70–99)
Glucose-Capillary: 171 mg/dL — ABNORMAL HIGH (ref 70–99)
Glucose-Capillary: 179 mg/dL — ABNORMAL HIGH (ref 70–99)

## 2020-04-10 LAB — CBC
HCT: 29.2 % — ABNORMAL LOW (ref 36.0–46.0)
Hemoglobin: 9.2 g/dL — ABNORMAL LOW (ref 12.0–15.0)
MCH: 31.2 pg (ref 26.0–34.0)
MCHC: 31.5 g/dL (ref 30.0–36.0)
MCV: 99 fL (ref 80.0–100.0)
Platelets: 250 10*3/uL (ref 150–400)
RBC: 2.95 MIL/uL — ABNORMAL LOW (ref 3.87–5.11)
RDW: 15.7 % — ABNORMAL HIGH (ref 11.5–15.5)
WBC: 7.4 10*3/uL (ref 4.0–10.5)
nRBC: 0 % (ref 0.0–0.2)

## 2020-04-10 LAB — BASIC METABOLIC PANEL
Anion gap: 8 (ref 5–15)
BUN: <5 mg/dL — ABNORMAL LOW (ref 6–20)
CO2: 26 mmol/L (ref 22–32)
Calcium: 8.7 mg/dL — ABNORMAL LOW (ref 8.9–10.3)
Chloride: 109 mmol/L (ref 98–111)
Creatinine, Ser: 0.58 mg/dL (ref 0.44–1.00)
GFR, Estimated: >60 mL/min (ref >60)
Glucose, Bld: 121 mg/dL — ABNORMAL HIGH (ref 70–99)
Potassium: 3 mmol/L — ABNORMAL LOW (ref 3.5–5.1)
Sodium: 143 mmol/L (ref 135–145)

## 2020-04-10 LAB — MAGNESIUM: Magnesium: 1.8 mg/dL (ref 1.7–2.4)

## 2020-04-10 MED ORDER — POTASSIUM CHLORIDE 20 MEQ PO PACK
40.0000 meq | PACK | ORAL | Status: AC
  Administered 2020-04-10 (×2): 40 meq via ORAL
  Filled 2020-04-10 (×2): qty 2

## 2020-04-10 NOTE — Progress Notes (Signed)
Chief Complaint: Patient was seen in consultation today for  Chief Complaint  Patient presents with  . Failure To Thrive    Referring Physician(s): Dr. Waymon Amato  Supervising Physician: Malachy Moan  Patient Status: [facility] - Out-pt  History of Present Illness: Rachel Vang is a 55 y.o. female with dysphagia and FTT. Previously had a PEG tube that she had for many years but was eventually removed. She initially was not agreeable to having another G-tube placed but is now having dysphagia with FTT/PCM again. After discussion with her son and the palliative/medical teams, she is agreeable to have another G-tube placed. IR is asked to place new G-tube PMHx, meds, labs, imaging, allergies reviewed. On Xarelto for hx of DVT/PE    Past Medical History:  Diagnosis Date  . Acute pulmonary embolism (HCC) 02/19/2015  . Acute respiratory failure (HCC)   . Diabetes mellitus without complication (HCC)    Type 2, W/o complications  . DVT (deep venous thrombosis) (HCC) 04/05/2015  . Dysphagia   . Epilepsy (HCC)   . GERD (gastroesophageal reflux disease)   . Hyperlipidemia   . Hypertension   . IBS (irritable bowel syndrome)   . Nontraumatic subarachnoid hemorrhage (HCC)   . SAH (subarachnoid hemorrhage) (HCC)   . Urinary retention     Past Surgical History:  Procedure Laterality Date  . ABDOMINAL SURGERY    . ANEURYSM COILING    . COLONOSCOPY N/A 02/20/2015   Procedure: COLONOSCOPY;  Surgeon: Iva Boop, MD;  Location: [facility] ENDOSCOPY;  Service: Endoscopy;  Laterality: N/A;  . ESOPHAGOGASTRODUODENOSCOPY (EGD) WITH PROPOFOL N/A 02/02/2015   Procedure: ESOPHAGOGASTRODUODENOSCOPY (EGD) WITH PROPOFOL;  Surgeon: Jimmye Norman, MD;  Location: [facility] ENDOSCOPY;  Service: General;  Laterality: N/A;  . IR GENERIC HISTORICAL  08/26/2015   IR GASTRIC TUBE PERC CHG W/O IMG GUIDE 08/26/2015 Irish Lack, MD [facility]-INTERV RAD  . IR GENERIC HISTORICAL  09/14/2015   IR REPLC GASTRO/COLONIC TUBE  PERCUT W/FLUORO 09/14/2015 Darrell K Allred, PA-C [facility]-INTERV RAD  . IR REPLACE G-TUBE SIMPLE WO FLUORO  06/08/2016  . IR REPLACE G-TUBE SIMPLE WO FLUORO  10/24/2016  . IR REPLACE G-TUBE SIMPLE WO FLUORO  01/07/2017  . PEG PLACEMENT N/A 02/02/2015   Procedure: PERCUTANEOUS ENDOSCOPIC GASTROSTOMY (PEG) PLACEMENT;  Surgeon: Jimmye Norman, MD;  Location: [facility] ENDOSCOPY;  Service: General;  Laterality: N/A;  . RADIOLOGY WITH ANESTHESIA N/A 01/13/2015   Procedure: RADIOLOGY WITH ANESTHESIA;  Surgeon: Lisbeth Renshaw, MD;  Location: [facility] OR;  Service: Radiology;  Laterality: N/A;  . TRACHEOSTOMY      Allergies: Patient has no known allergies.  Medications:  Current Facility-Administered Medications:  .  acetaminophen (TYLENOL) tablet 650 mg, 650 mg, Oral, Q6H PRN, 650 mg at 04/06/20 1950 **OR** acetaminophen (TYLENOL) suppository 650 mg, 650 mg, Rectal, Q6H PRN, Bobette Mo, MD .  amoxicillin-clavulanate (AUGMENTIN) 400-57 MG/5ML suspension 875 mg, 875 mg, Oral, Q12H, Hongalgi, Anand D, MD, 875 mg at 04/10/20 1007 .  bethanechol (URECHOLINE) tablet 10 mg, 10 mg, Oral, TID, Hongalgi, Anand D, MD, 10 mg at 04/10/20 1010 .  Chlorhexidine Gluconate Cloth 2 % PADS 6 each, 6 each, Topical, Daily, Bobette Mo, MD, 6 each at 04/07/20 1017 .  feeding supplement (ENSURE ENLIVE / ENSURE PLUS) liquid 237 mL, 237 mL, Oral, TID BM, Sharl Ma, Sarina Ill, MD, 237 mL at 04/10/20 1007 .  gabapentin (NEURONTIN) 250 MG/5ML solution 200 mg, 200 mg, Oral, Q12H, Hongalgi, Anand D, MD, 200 mg at 04/10/20 1008 .  levETIRAcetam (  KEPPRA) 100 MG/ML solution 750 mg, 750 mg, Oral, BID, Hongalgi, Anand D, MD, 750 mg at 04/10/20 1009 .  MEDLINE mouth rinse, 15 mL, Mouth Rinse, BID, Bobette Mo, MD, 15 mL at 04/10/20 1009 .  metoprolol tartrate (LOPRESSOR) 25 mg/10 mL oral suspension 25 mg, 25 mg, Oral, BID, Chilton Si, MD, 25 mg at 04/10/20 1008 .  mirtazapine (REMERON) tablet 15 mg, 15 mg, Oral, QHS, Hongalgi,  Anand D, MD, 15 mg at 04/09/20 2207 .  morphine 2 MG/ML injection 2 mg, 2 mg, Intravenous, Q3H PRN, Manuela Schwartz, NP, 2 mg at 04/10/20 1005 .  potassium chloride (KLOR-CON) packet 40 mEq, 40 mEq, Oral, Q4H, Hongalgi, Anand D, MD, 40 mEq at 04/10/20 1100 .  prochlorperazine (COMPAZINE) injection 5 mg, 5 mg, Intravenous, Q6H PRN, Bobette Mo, MD .  rivaroxaban Carlena Hurl) tablet 20 mg, 20 mg, Oral, Q supper, Hongalgi, Anand D, MD, 20 mg at 04/09/20 1932 .  rosuvastatin (CRESTOR) tablet 5 mg, 5 mg, Oral, Daily, Hongalgi, Anand D, MD, 5 mg at 04/10/20 1010 .  sertraline (ZOLOFT) tablet 50 mg, 50 mg, Oral, Daily, Hongalgi, Anand D, MD, 50 mg at 04/10/20 1011 .  traZODone (DESYREL) tablet 100 mg, 100 mg, Oral, QHS, Hongalgi, Anand D, MD, 100 mg at 04/09/20 2207    Family History  Problem Relation Age of Onset  . Hypertension Other     Social History   Socioeconomic History  . Marital status: Single    Spouse name: Not on file  . Number of children: Not on file  . Years of education: Not on file  . Highest education level: Not on file  Occupational History  . Not on file  Tobacco Use  . Smoking status: Former Games developer  . Smokeless tobacco: Never Used  Substance and Sexual Activity  . Alcohol use: No  . Drug use: No  . Sexual activity: Not on file  Other Topics Concern  . Not on file  Social History Narrative  . Not on file   Social Determinants of Health   Financial Resource Strain: Not on file  Food Insecurity: Not on file  Transportation Needs: Not on file  Physical Activity: Not on file  Stress: Not on file  Social Connections: Not on file     Review of Systems: A 12 point ROS discussed and pertinent positives are indicated in the HPI above.  All other systems are negative.  Review of Systems  Vital Signs: BP (!) 123/91 (BP Location: Right Arm)   Pulse 99   Temp 97.8 F (36.6 C)   Resp 18   Ht 5\' 6"  (1.676 m)   Wt 50.8 kg   LMP  (LMP Unknown)   SpO2  97%   BMI 18.08 kg/m   Physical Exam Constitutional:      Appearance: She is ill-appearing. She is not toxic-appearing.  HENT:     Mouth/Throat:     Mouth: Mucous membranes are moist.     Pharynx: Oropharynx is clear.  Cardiovascular:     Rate and Rhythm: Regular rhythm. Tachycardia present.     Heart sounds: Normal heart sounds.  Pulmonary:     Effort: Pulmonary effort is normal. No respiratory distress.     Breath sounds: Normal breath sounds.  Abdominal:     General: There is no distension.     Palpations: Abdomen is soft.     Tenderness: There is no abdominal tenderness.     Comments: Previous G-tube site scar well  healed     Imaging: CT ANGIO CHEST PE W OR WO CONTRAST  Result Date: 04/08/2020 CLINICAL DATA:  Right upper chest pain, positive D-dimer, history of deep venous thrombosis EXAM: CT ANGIOGRAPHY CHEST WITH CONTRAST TECHNIQUE: Multidetector CT imaging of the chest was performed using the standard protocol during bolus administration of intravenous contrast. Multiplanar CT image reconstructions and MIPs were obtained to evaluate the vascular anatomy. CONTRAST:  69mL OMNIPAQUE IOHEXOL 350 MG/ML SOLN COMPARISON:  04/04/2020, 05/06/2017 FINDINGS: Cardiovascular: This is a technically adequate evaluation of the pulmonary vasculature. No filling defects or pulmonary emboli. Trace pericardial fluid. The heart is not enlarged. Normal caliber of the thoracic aorta without dissection. Minimal atherosclerosis. Mediastinum/Nodes: No enlarged mediastinal, hilar, or axillary lymph nodes. Thyroid gland, trachea, and esophagus demonstrate no significant findings. Lungs/Pleura: Dependent areas of airspace disease are seen within the right upper and right lower lobes, corresponding to abnormality seen on recent chest x-ray. Findings could reflect infection or aspiration. No effusion or pneumothorax. Central airways are patent. Upper Abdomen: No acute abnormality. Musculoskeletal: No acute or  destructive bony lesions. Reconstructed images demonstrate no additional findings. Review of the MIP images confirms the above findings. IMPRESSION: 1. No evidence of pulmonary embolus. 2. Dependent airspace disease within the right lung, consistent with infection or aspiration. 3.  Aortic Atherosclerosis (ICD10-I70.0). Electronically Signed   By: Sharlet Salina M.D.   On: 04/08/2020 21:31   DG Chest Portable 1 View  Result Date: 04/04/2020 CLINICAL DATA:  Hypoxia. EXAM: PORTABLE CHEST 1 VIEW COMPARISON:  Radiograph 10/07/2017 FINDINGS: New volume loss in the right hemithorax. Vague right perihilar opacity. Left lung is clear. The heart is normal in size. Aortic tortuosity versus rotation secondary to right lung volume loss. No pneumothorax or large pleural effusion. The bones are subjectively under mineralized. IMPRESSION: New volume loss in the right hemithorax with vague right perihilar opacity, findings suspicious for aspiration. Possibility of mucous plugging is also considered. Electronically Signed   By: Narda Rutherford M.D.   On: 04/04/2020 17:35   ECHOCARDIOGRAM COMPLETE  Result Date: 04/08/2020    ECHOCARDIOGRAM REPORT   Patient Name:   Rachel Vang Date of Exam: 04/08/2020 Medical Rec #:  [id number]          Height:       66.0 in Accession #:    [id number]         Weight:       112.0 lb Date of Birth:  January 31, 1965           BSA:          1.563 m Patient Age:    54 years           BP:           112/80 mmHg Patient Gender: F                  HR:           109 bpm. Exam Location:  Inpatient Procedure: 2D Echo, Cardiac Doppler and Color Doppler Indications:    R94.31 Abnormal EKG  History:        Patient has prior history of Echocardiogram examinations, most                 recent 05/08/2017. Risk Factors:Hypertension, Diabetes and                 Dyslipidemia. Pulmonary Embolism. GERD. Seizures.  Sonographer:    Elmarie Shiley Dance Referring Phys: [id number] DUKGU D  HONGALGI  Sonographer Comments: No subcostal  window and Technically difficult study due to poor echo windows. IMPRESSIONS  1. Moderate pericardial effusion. The pericardial effusion is anterior to the right ventricle. Mild RV diastolic compression without RV diastolic collapse. Unable to assess IVC or hepatic veins. Respirometer not performed. Study is suboptimal for assessment of constricitive physiology. Tachycardia noted. Consider repeat echo in 2-3 days or with clinical deterioration.  2. Left ventricular ejection fraction, by estimation, is 60 to 65%. The left ventricle has normal function. Left ventricular endocardial border not optimally defined to evaluate regional wall motion. Indeterminate diastolic filling due to E-A fusion.  3. Right ventricular systolic function is normal. The right ventricular size is normal. Tricuspid regurgitation signal is inadequate for assessing PA pressure.  4. The mitral valve is normal in structure. No evidence of mitral valve regurgitation. No evidence of mitral stenosis.  5. The aortic valve was not well visualized. Aortic valve regurgitation is not visualized. No aortic stenosis is present. FINDINGS  Left Ventricle: Left ventricular ejection fraction, by estimation, is 60 to 65%. The left ventricle has normal function. Left ventricular endocardial border not optimally defined to evaluate regional wall motion. The left ventricular internal cavity size was normal in size. There is no left ventricular hypertrophy. Indeterminate diastolic filling due to E-A fusion. Right Ventricle: The right ventricular size is normal. No increase in right ventricular wall thickness. Right ventricular systolic function is normal. Tricuspid regurgitation signal is inadequate for assessing PA pressure. Left Atrium: Left atrial size was normal in size. Right Atrium: Right atrial size was normal in size. Pericardium: A moderately sized pericardial effusion is present. The pericardial effusion is anterior to the right ventricle. Presence of  pericardial fat pad. Mitral Valve: The mitral valve is normal in structure. No evidence of mitral valve regurgitation. No evidence of mitral valve stenosis. Tricuspid Valve: The tricuspid valve is normal in structure. Tricuspid valve regurgitation is trivial. No evidence of tricuspid stenosis. Aortic Valve: The aortic valve was not well visualized. Aortic valve regurgitation is not visualized. No aortic stenosis is present. Pulmonic Valve: The pulmonic valve was not well visualized. Pulmonic valve regurgitation is not visualized. No evidence of pulmonic stenosis. Aorta: The aortic root is normal in size and structure. Venous: The inferior vena cava was not well visualized. IAS/Shunts: The interatrial septum was not well visualized.  LEFT VENTRICLE PLAX 2D LVIDd:         4.18 cm  Diastology LVIDs:         3.41 cm  LV e' medial:    7.21 cm/s LV PW:         1.11 cm  LV E/e' medial:  11.2 LV IVS:        0.94 cm  LV e' lateral:   5.72 cm/s LVOT diam:     2.20 cm  LV E/e' lateral: 14.2 LV SV:         34 LV SV Index:   22 LVOT Area:     3.80 cm  RIGHT VENTRICLE RV Basal diam:  1.71 cm RV S prime:     12.10 cm/s TAPSE (M-mode): 1.9 cm LEFT ATRIUM         Index LA diam:    2.90 cm 1.86 cm/m  AORTIC VALVE LVOT Vmax:   57.80 cm/s LVOT Vmean:  37.200 cm/s LVOT VTI:    0.089 m  AORTA Ao Root diam: 3.30 cm MITRAL VALVE MV Area (PHT): 3.42 cm    SHUNTS MV Decel Time:  222 msec    Systemic VTI:  0.09 m MV E velocity: 81.10 cm/s  Systemic Diam: 2.20 cm Weston Brass MD Electronically signed by Weston Brass MD Signature Date/Time: 04/08/2020/3:48:08 PM    Final    VAS [place] LOWER EXTREMITY VENOUS (DVT)  Result Date: 04/09/2020  Lower Venous DVT Study Indications: Tachycardia.  Risk Factors: None identified. Limitations: Poor ultrasound/tissue interface and patient positioning, patient immobility, patient pain tolerance, severe leg contractures. Comparison Study: No prior studies. Performing Technologist: Chanda Busing RVT   Examination Guidelines: A complete evaluation includes B-mode imaging, spectral Doppler, color Doppler, and power Doppler as needed of all accessible portions of each vessel. Bilateral testing is considered an integral part of a complete examination. Limited examinations for reoccurring indications may be performed as noted. The reflux portion of the exam is performed with the patient in reverse Trendelenburg.  +---------+---------------+---------+-----------+----------+-------------------+ RIGHT    CompressibilityPhasicitySpontaneityPropertiesThrombus Aging      +---------+---------------+---------+-----------+----------+-------------------+ CFV      Full           Yes      Yes                                      +---------+---------------+---------+-----------+----------+-------------------+ SFJ      Full                                                             +---------+---------------+---------+-----------+----------+-------------------+ FV Prox  Full                                                             +---------+---------------+---------+-----------+----------+-------------------+ FV Mid   Full                                                             +---------+---------------+---------+-----------+----------+-------------------+ FV DistalFull                                                             +---------+---------------+---------+-----------+----------+-------------------+ PFV                                                   Not well visualized +---------+---------------+---------+-----------+----------+-------------------+ POP      Full           No       No                                       +---------+---------------+---------+-----------+----------+-------------------+  PTV      Full                                                              +---------+---------------+---------+-----------+----------+-------------------+ PERO     Full                                                             +---------+---------------+---------+-----------+----------+-------------------+   +---------+---------------+---------+-----------+----------+-------------------+ LEFT     CompressibilityPhasicitySpontaneityPropertiesThrombus Aging      +---------+---------------+---------+-----------+----------+-------------------+ CFV      Full           Yes      Yes                                      +---------+---------------+---------+-----------+----------+-------------------+ SFJ      Full                                                             +---------+---------------+---------+-----------+----------+-------------------+ FV Prox  Full                                                             +---------+---------------+---------+-----------+----------+-------------------+ FV Mid   Full                                                             +---------+---------------+---------+-----------+----------+-------------------+ FV DistalFull                                                             +---------+---------------+---------+-----------+----------+-------------------+ PFV                                                   Not well visualized +---------+---------------+---------+-----------+----------+-------------------+ POP                     No       No                                       +---------+---------------+---------+-----------+----------+-------------------+ PTV  Full                                                             +---------+---------------+---------+-----------+----------+-------------------+ PERO     Full                                                             +---------+---------------+---------+-----------+----------+-------------------+      Summary: RIGHT: - There is no evidence of deep vein thrombosis in the lower extremity. However, portions of this examination were limited- see technologist comments above.  - No cystic structure found in the popliteal fossa.  LEFT: - There is no evidence of deep vein thrombosis in the lower extremity. However, portions of this examination were limited- see technologist comments above.  - No cystic structure found in the popliteal fossa.  *See table(s) above for measurements and observations. Electronically signed by Coral Else MD on 04/09/2020 at 4:18:36 PM.    Final     Labs:  CBC: Recent Labs    04/06/20 0256 04/07/20 0430 04/09/20 0522 04/10/20 0535  WBC 15.5* 12.7* 7.7 7.4  HGB 10.6* 10.3* 8.9* 9.2*  HCT 35.0* 32.6* 29.0* 29.2*  PLT 115* 123* 171 250    COAGS: Recent Labs    04/04/20 1725  INR 1.5*  APTT 34    BMP: Recent Labs    04/07/20 1133 04/08/20 0532 04/09/20 0522 04/10/20 0535  NA 141 140 143 143  K 3.5 3.9 3.3* 3.0*  CL 103 107 110 109  CO2 GLUCOSE 85 106* 114* 121*  BUN <5* <5* <5* <5*  CALCIUM 8.5* 8.7* 8.4* 8.7*  CREATININE 0.54 0.53 0.51 0.58  GFRNONAA >60 >60 >60 >60    LIVER FUNCTION TESTS: Recent Labs    04/04/20 1725 04/05/20 1012 04/06/20 0256 04/08/20 0532  BILITOT 0.9 0.9 0.8 0.8  AST ALT ALKPHOS 95 89 106 107  PROT 8.6* 6.0* 6.5 6.6  ALBUMIN 3.9 2.8* 2.9* 2.7*    TUMOR MARKERS: No results for input(s): AFPTM, CEA, CA199, CHROMGRNA in the last 8760 hours.  Assessment and Plan: Dysphagia/FTT/PCM Pt and family agreeable to having perc G-tube placed. Will need to look at schedule in the coming days to see when can accommodate. Likely earliest would be Tuesday Xarelto will need to be held 24 hours prior. Called and d/w son. Risks and benefits image guided gastrostomy tube placement was discussed with the patient and her family including, but not limited to the need for a barium enema  during the procedure, bleeding, infection, peritonitis and/or damage to adjacent structures.  All  questions were answered, patient is agreeable to proceed.  Consent signed and in chart.    Thank you for this interesting consult.  I greatly enjoyed meeting SUSAN BLEICH and look forward to participating in their care.  A copy of this report was sent to the requesting provider on this date.  Electronically Signed: Brayton El, PA-C 04/10/2020, 2:48 PM   I spent a total of 20 miuntes in face to face in  clinical consultation, greater than 50% of which was counseling/coordinating care for perc g-tube

## 2020-04-10 NOTE — Progress Notes (Signed)
Pt with no urine output so far this shift. Bladder scan showed , but unsure of accuracy. MD Hongalgi made aware and order received for straight cath x1. dark yellow urine returned. Pt voiced relief. Will continue to monitor patient.

## 2020-04-10 NOTE — Progress Notes (Signed)
PROGRESS NOTE   Rachel Vang  PJK:[id number]    DOB: October 23, 1965    DOA: 04/04/2020  PCP: Patient, No Pcp Per   I have briefly reviewed patients previous medical records in [facility].  Chief Complaint  Patient presents with  . Failure To Thrive    Brief Narrative:  55 year old female, medical history including but not limited to pulmonary embolism, DVT, type II DM, epilepsy, GERD, HLD, HTN, nontraumatic subarachnoid hemorrhage, left hemiparesis, IBS, dysphagia,?  Nursing home resident, nonambulatory/wheelchair mobile, presented to the ED due to not eating or drinking for past 1 week PTA.  Admitted for sepsis due to aspiration pneumonia from pre-existing dysphagia, dehydration with hypernatremia.  Clinically improved.  Transferred to medical bed 3/10.  Ongoing poor oral intake/pocketing of food.  Also persistent sinus tachycardia, unclear etiology, lower extremity Dopplers and CTA negative for DVT and PE respectively, 2D echo shows moderate pericardial effusion without tamponade features, TSH normal.  Cardiology consulted, started low-dose beta-blockers, improving.  Ongoing dysphagia issues, inconsistent oral intake, difficulty swallowing some of the pills.  Patient finally agreeable to PEG tube placement, IR consulted.   Assessment & Plan:  Principal Problem:   Sepsis due to pneumonia [facility]) Active Problems:   Seizures ([place])   DVT (deep venous thrombosis) (HCC)   Type II diabetes mellitus with neurological manifestations ([place])   Essential hypertension, benign   Chronic pulmonary embolism (HCC)   Dyslipidemia associated with type 2 diabetes mellitus (HCC)   Hypernatremia   Inappropriate sinus tachycardia   Hemiplegia as late effect of cerebrovascular accident (CVA) ([place])   Hypokalemia   Prolonged QT interval   Malnutrition of moderate degree   Aspiration pneumonia (HCC)   Severe sepsis, POA, secondary to aspiration pneumonia from pre-existing dysphagia:  Met sepsis  criteria on admission including fever of 100.3 F, tachypnea in the 20s-occasionally low 30s, tachycardic and hypoxic at 89% on 10 L/min oxygen/NRB.  Neutrophilic leukocytosis/WBC 14.3 on admission.  Lactate peaked to 6.7.  Flu panel and COVID-19 PCR negative.  Urine streptococcal antigen negative.  Blood cultures remain negative today.  MRSA PCR negative.  Urine microscopy not suggestive of UTI and culture showed multiple species.  HIV screen nonreactive.  S/p IV azithromycin x1 dose and ceftriaxone x1 dose on 3/7.  Then treated with IV Unasyn 3/7 >.  Completed approximately 3 days of IV antibiotics.  Although intermittent low-grade fevers, she looks good, not septic or toxic looking, tachycardia switched to Augmentin 3/10 x5 days.    Recommend follow-up of chest x-ray in 4 weeks.  CTA chest 3/11 shows dependent airspace disease on the right side suggestive of aspiration.  Improved.  Discussed with patient and son at bedside 3/11 that patient remains at high risk for recurrent aspiration due to underlying dysphagia issues and also cognitive impairment.  Inappropriate sinus tachycardia/pericardial effusion  Asymptomatic.  Unclear etiology.  Likely multifactorial-pain at times, anemia.  Sepsis resolved.  Clinically euvolemic.  Given prior history of DVT/PE on Xarelto, bedbound status, lower extremity contractures, at risk for VTE's, checked D-dimer which was positive, bilateral lower extremity venous Dopplers negative for DVT and CTA chest negative for PE.  2D echo showed normal EF but moderate pericardial effusion without tamponade features.    At risk for rate related cardiomyopathy.  Cardiology follow-up appreciated.  Inappropriate sinus tachycardia.  Started metoprolol 25 mg twice daily and recommend repeating 2D echo in a couple of weeks to make sure that the pericardial effusion is not progressing.  Better.  Continue  to monitor.   Dysphagia  Chronic.?  Related to prior  subarachnoid hemorrhage.  Not sure if she had a CVA.  Speech therapy follow-up appreciated and have advance diet to dysphagia 2 diet and thin liquids with which hopefully her oral intake will improve.  Dr. Domingo Cocking, palliative care MD and I had separate and detailed conversation with patient regarding PEG tube which she had repeatedly declined.  She reportedly had a PEG tube in the past which had to be removed due to leakage around the tube.  Speech therapy follow-up appreciated and recommend ongoing dysphagia 2 (fine chopped), dysphagia 1 (pured) and thin liquids.  Patient noted to be pocketing food.  Ongoing inconsistent oral intake and also difficulty in taking some of her oral meds.  Today patient agreeable to a PEG tube placement.  Discussed with son who reportedly had a long discussion with his mother last night and they both RN in agreement.  IR consulted for PEG tube placement.  Will await timing and will have to hold Xarelto and consider bridging with IV heparin before the procedure.  Dehydration with hypernatremia  Secondary to poor oral intake.  Presented with serum sodium of 158.  Treated with hypotonic/half-normal saline.  Resolved.  Briefly hydrated again with IVF last night.  Now off IV fluids.  Will be at risk of recurrent dehydration if has inconsistent oral intake.  Hypokalemia  Potassium 3 today.  Replace aggressively and follow.  Magnesium 1.8.  Lactic acidosis:  Secondary to sepsis and acute respiratory failure.  Lactate peaked to 6.7.  Treated per sepsis protocol with aggressive IV fluids and improved.  Acute respiratory failure with hypoxia  Secondary to aspiration pneumonia  Resolved.  Seizure disorder  Continue prior home dose of Keppra.  Essential hypertension  Mildly uncontrolled at times  Dyslipidemia  Continue home rosuvastatin.  Type II DM  Well-controlled.  History of DVT/PE  Briefly on full dose Lovenox which has been  switched back to her home dose of Xarelto.    Prolonged QTC  Improved, EKG 3/10: QTC 456 ms  Continue to replace low potassium.  Avoid QT prolonging medications.  Adult failure to thrive  Likely major component being poor oral intake related to dysphagia.  Palliative care MD input appreciated: Full code/full scope, family to complete MOST form when able, palliative care to continue to follow at SNF.  Communicated with palliative MD regarding patient's consent for PEG tube.  Anemia of chronic disease  Hemoglobin up to 9.2 from 8.9.  Relatively stable.  Thrombocytopenia  Possibly related to sepsis.  Resolved.  Diarrhea  Resolved.  Body mass index is 18.08 kg/m.  Nutritional Status Nutrition Problem: Moderate Malnutrition Etiology: chronic illness,dysphagia Signs/Symptoms: mild fat depletion,moderate muscle depletion Interventions: Refer to RD note for recommendations    DVT prophylaxis:   Full dose Lovenox   Code Status: Full Code Family Communication: I discussed with patient's son in detail this morning via phone, updated care and answered questions.  He indicated that he discussed in detail with his mother last night and may have convinced her to change her mind.  Both are now agreeable for PEG tube placement. Disposition:  Status is: Inpatient  Remains inpatient appropriate because:Inpatient level of care appropriate due to severity of illness   Dispo: The patient is from: LTC SNF at [facility]              Anticipated d/c is to: Return to LTC SNF at [facility] pending PEG tube placement and initiation  of PEG feeds.  May be middle of next week.              Patient currently is not medically stable to d/c.   Difficult to place patient No        Consultants:   Palliative care medicine Cardiology IR-pending  Procedures:   None  Antimicrobials:    Anti-infectives (From admission, onward)   Start     Dose/Rate Route Frequency Ordered Stop    04/08/20 1030  amoxicillin-clavulanate (AUGMENTIN) 400-57 MG/5ML suspension 875 mg        875 mg Oral Every 12 hours 04/08/20 0935     04/07/20 2200  amoxicillin-clavulanate (AUGMENTIN) 875-125 MG per tablet 1 tablet  Status:  Discontinued        1 tablet Oral Every 12 hours 04/07/20 1417 04/08/20 0935   04/05/20 0400  Ampicillin-Sulbactam (UNASYN) 3 g in sodium chloride 0.9 % 100 mL IVPB  Status:  Discontinued        3 g 200 mL/hr over 30 Minutes Intravenous Every 8 hours 04/04/20 2047 04/07/20 1417   04/04/20 1700  cefTRIAXone (ROCEPHIN) 2 g in sodium chloride 0.9 % 100 mL IVPB  Status:  Discontinued        2 g 200 mL/hr over 30 Minutes Intravenous Every 24 hours 04/04/20 1657 04/04/20 2026   04/04/20 1700  azithromycin (ZITHROMAX) 500 mg in sodium chloride 0.9 % 250 mL IVPB  Status:  Discontinued        500 mg 250 mL/hr over 60 Minutes Intravenous Every 24 hours 04/04/20 1657 04/04/20 2026        Subjective:  Denies complaints.  Today indicates that she wants to proceed with the PEG tube.  Objective:   Vitals:   04/09/20 1347 04/09/20 2205 04/10/20 0311 04/10/20 0609  BP: 121/90 117/79 101/74 (!) 123/91  Pulse: (!) 122 (!) 118 (!) 112 99  Resp: _0 Temp: 98 F (36.7 C) 98.6 F (37 C) 98.8 F (37.1 C) 97.8 F (36.6 C)  TempSrc: Oral     SpO2: 96% 98% 97% 97%  Weight:      Height:        General exam: Young female, small built and frail, lying comfortably propped up in bed without distress.   ENT: As examined on 3/11: Pocketing food.  After she was made to spit out all of it there was still some left in the mouth.  She just has 4-5 front teeth on the top and below, difficult to clearly examine because of food residue but appears that her teeth require cleaning but did not really notice any acute findings or caries.  No other acute findings noted. Respiratory system: Clear to auscultation.  No increased work of breathing. Cardiovascular system: S1 and S2 heard,  regular mild tachycardia.  No JVD, murmurs or pedal edema.  Telemetry personally reviewed: Sinus tachycardia in the 100s, improved compared to prior. Gastrointestinal system: Abdomen is nondistended, soft and nontender.  No organomegaly or masses appreciated.  Normal bowel sounds heard. Central nervous system: Alert and oriented x2.  Does not say much.  Follows simple instructions.  No focal deficits. Extremities: Grade 5 x 5 power in right upper extremity, 4+ by 5 left hand grip.  Bilateral lower extremity with significant contractures. Skin: No rashes, lesions or ulcers Psychiatry: Judgement and insight appear impaired. Mood & affect pleasant and appropriate today.    Data Reviewed:   I have personally reviewed following labs and  imaging studies   CBC: Recent Labs  Lab 04/04/20 1725 04/05/20 1012 04/06/20 0256 04/07/20 0430 04/09/20 0522 04/10/20 0535  WBC 14.3* 13.8*   < > 12.7* 7.7 7.4  NEUTROABS 11.2* 10.8*  --   --   --   --   HGB 14.4 10.9*   < > 10.3* 8.9* 9.2*  HCT 47.2* 36.7   < > 32.6* 29.0* 29.2*  MCV 99.8 105.2*   < > 97.6 99.7 99.0  PLT 214 111*   < > 123* 171 250   < > = values in this interval not displayed.    Basic Metabolic Panel: Recent Labs  Lab 04/06/20 0256 04/07/20 0430 04/07/20 1133 04/08/20 0532 04/09/20 0522 04/10/20 0535  NA 144 137   < > 140 143 143  K 2.7* 2.9*   < > 3.9 3.3* 3.0*  CL 105 101   < > 107 110 109  CO2 27 26   < > _0 GLUCOSE 82 101*   < > 106* 114* 121*  BUN 10 6   < > <5* <5* <5*  CREATININE 0.54 0.53   < > 0.53 0.51 0.58  CALCIUM 8.5* 8.0*   < > 8.7* 8.4* 8.7*  MG 1.7 2.1  --   --   --  1.8   < > = values in this interval not displayed.    Liver Function Tests: Recent Labs  Lab 04/05/20 1012 04/06/20 0256 04/08/20 0532  AST _1 ALT _2 ALKPHOS 89 106 107  BILITOT 0.9 0.8 0.8  PROT 6.0* 6.5 6.6  ALBUMIN 2.8* 2.9* 2.7*    CBG: Recent Labs  Lab 04/09/20 1814 04/10/20 0016 04/10/20 0611   GLUCAP 130* 171* 119*    Microbiology Studies:   Recent Results (from the past 240 hour(s))  Blood culture (routine x 2)     Status: None   Collection Time: 04/04/20  5:25 PM   Specimen: BLOOD RIGHT FOREARM  Result Value Ref Range Status   Specimen Description   Final    BLOOD RIGHT FOREARM Performed at [facility], [place] [address], [place], [place] [address]    Special Requests   Final    BACTERIAL CASTS Blood Culture adequate volume Performed at [place] [address] [place], [place] [address]    Culture   Final    NO GROWTH 5 DAYS Performed at [facility], [place] [address] [place], [place] [address]    Report Status 04/09/2020 FINAL  Final  Blood culture (routine x 2)     Status: None   Collection Time: 04/04/20  5:25 PM   Specimen: BLOOD LEFT FOREARM  Result Value Ref Range Status   Specimen Description   Final    BLOOD LEFT FOREARM Performed at [place] [address], [place], [place] [address]    Special Requests   Final    BLOOD Blood Culture results may not be optimal due to an inadequate volume of blood received in culture bottles Performed at [place] [address], [place], [place] [address]    Culture   Final    NO GROWTH 5 DAYS Performed at [facility], [place] [address] [place],  [address]    Report Status 04/09/2020 FINAL  Final  Resp Panel by RT-PCR (Flu A&B, Covid) Nasopharyngeal Swab     Status: None   Collection Time: 04/04/20  5:25 PM   Specimen: Nasopharyngeal Swab; Nasopharyngeal(NP) swabs in vial transport medium  Result Value Ref Range Status   SARS Coronavirus 2 by RT PCR NEGATIVE NEGATIVE Final    Comment: (NOTE) SARS-CoV-2 target nucleic acids are NOT DETECTED.  The SARS-CoV-2 RNA is generally detectable in upper respiratory specimens during the acute phase of infection. The lowest concentration of SARS-CoV-2 viral copies this  assay can detect is 138 copies/mL. A negative result does not preclude SARS-Cov-2 infection and should not be used as the sole basis for treatment or other patient management decisions. A negative result may occur with  improper specimen collection/handling, submission of specimen other than nasopharyngeal swab, presence of viral mutation(s) within the areas targeted by this assay, and inadequate number of viral copies(<138 copies/mL). A negative result must be combined with clinical observations, patient history, and epidemiological information. The expected result is Negative.  Fact Sheet for Patients:  EntrepreneurPulse.com.au  Fact Sheet for Healthcare Providers:  IncredibleEmployment.be  This test is no t yet approved or cleared by the [place] FDA and  has been authorized for detection and/or diagnosis of SARS-CoV-2 by FDA under an Emergency Use Authorization (EUA). This EUA will remain  in effect (meaning this test can be used) for the duration of the COVID-19 declaration under Section 564(b)(1) of the Act, 21 U.S.C.section 360bbb-3(b)(1), unless the authorization is terminated  or revoked sooner.       Influenza A by PCR NEGATIVE NEGATIVE Final   Influenza B by PCR NEGATIVE NEGATIVE Final    Comment: (NOTE) The Xpert Xpress SARS-CoV-2/FLU/RSV plus assay is intended as an aid in the diagnosis of influenza from Nasopharyngeal swab specimens and should not be used as a sole basis for treatment. Nasal washings and aspirates are unacceptable for Xpert Xpress SARS-CoV-2/FLU/RSV testing.  Fact Sheet for Patients: EntrepreneurPulse.com.au  Fact Sheet for Healthcare Providers: IncredibleEmployment.be  This test is not yet approved or cleared by the [place] FDA and has been authorized for detection and/or diagnosis of SARS-CoV-2 by FDA under an Emergency Use Authorization (EUA). This EUA will  remain in effect (meaning this test can be used) for the duration of the COVID-19 declaration under Section 564(b)(1) of the Act, 21 U.S.C. section 360bbb-3(b)(1), unless the authorization is terminated or revoked.  Performed at [facility], [place] [address], [place], [place] [address]   Urine culture     Status: Abnormal   Collection Time: 04/04/20 10:09 PM   Specimen: In/Out Cath Urine  Result Value Ref Range Status   Specimen Description   Final    IN/OUT CATH URINE Performed at [place] [address], [place], [place] [address]    Special Requests   Final    NONE Performed at [facility], [place] [address] [place], [place] [address]    Culture MULTIPLE SPECIES PRESENT, SUGGEST RECOLLECTION (A)  Final   Report Status 04/06/2020 FINAL  Final  MRSA PCR Screening     Status: None   Collection Time: 04/04/20 11:20 PM   Specimen: Nasopharyngeal  Result Value Ref Range Status   MRSA by PCR NEGATIVE NEGATIVE Final    Comment:        The GeneXpert MRSA Assay (FDA approved for NASAL specimens only), is one component of a comprehensive MRSA colonization surveillance program. It is not intended to diagnose MRSA infection nor to guide or monitor treatment for MRSA infections. Performed at [facility], [place] [address], [place], [place] [address]  SARS CORONAVIRUS 2 (TAT 6-24 HRS) Nasopharyngeal Nasopharyngeal Swab     Status: None   Collection Time: 04/09/20  6:15 AM   Specimen: Nasopharyngeal Swab  Result Value Ref Range Status   SARS Coronavirus 2 NEGATIVE NEGATIVE Final    Comment: (NOTE) SARS-CoV-2 target nucleic acids are NOT DETECTED.  The SARS-CoV-2 RNA is generally detectable in upper and lower respiratory specimens during the acute phase of infection. Negative results do not preclude SARS-CoV-2 infection, do not rule out co-infections with other pathogens, and should not be used as  the sole basis for treatment or other patient management decisions. Negative results must be combined with clinical observations, patient history, and epidemiological information. The expected result is Negative.  Fact Sheet for Patients: SugarRoll.be  Fact Sheet for Healthcare Providers: https://www.woods-mathews.com/  This test is not yet approved or cleared by the [place] FDA and  has been authorized for detection and/or diagnosis of SARS-CoV-2 by FDA under an Emergency Use Authorization (EUA). This EUA will remain  in effect (meaning this test can be used) for the duration of the COVID-19 declaration under Se ction 564(b)(1) of the Act, 21 U.S.C. section 360bbb-3(b)(1), unless the authorization is terminated or revoked sooner.  Performed at [facility], [place] [address] [place], [place] [address]      Radiology Studies:  CT ANGIO CHEST PE W OR WO CONTRAST  Result Date: 04/08/2020 CLINICAL DATA:  Right upper chest pain, positive D-dimer, history of deep venous thrombosis EXAM: CT ANGIOGRAPHY CHEST WITH CONTRAST TECHNIQUE: Multidetector CT imaging of the chest was performed using the standard protocol during bolus administration of intravenous contrast. Multiplanar CT image reconstructions and MIPs were obtained to evaluate the vascular anatomy. CONTRAST:  24m OMNIPAQUE IOHEXOL 350 MG/ML SOLN COMPARISON:  04/04/2020, 05/06/2017 FINDINGS: Cardiovascular: This is a technically adequate evaluation of the pulmonary vasculature. No filling defects or pulmonary emboli. Trace pericardial fluid. The heart is not enlarged. Normal caliber of the thoracic aorta without dissection. Minimal atherosclerosis. Mediastinum/Nodes: No enlarged mediastinal, hilar, or axillary lymph nodes. Thyroid gland, trachea, and esophagus demonstrate no significant findings. Lungs/Pleura: Dependent areas of airspace disease are seen within the right upper and right  lower lobes, corresponding to abnormality seen on recent chest x-ray. Findings could reflect infection or aspiration. No effusion or pneumothorax. Central airways are patent. Upper Abdomen: No acute abnormality. Musculoskeletal: No acute or destructive bony lesions. Reconstructed images demonstrate no additional findings. Review of the MIP images confirms the above findings. IMPRESSION: 1. No evidence of pulmonary embolus. 2. Dependent airspace disease within the right lung, consistent with infection or aspiration. 3.  Aortic Atherosclerosis (ICD10-I70.0). Electronically Signed   By: MRanda NgoM.D.   On: 04/08/2020 21:31   ECHOCARDIOGRAM COMPLETE  Result Date: 04/08/2020    ECHOCARDIOGRAM REPORT   Patient Name:   SNOE PITTSLEYDate of Exam: 04/08/2020 Medical Rec #:  [id number]         Height:       66.0 in Accession #:    [id number]        Weight:       112.0 lb Date of Birth:  [birth year]          BSA:          1.563 m Patient Age:    52years           BP:           112/80 mmHg Patient Gender: F  HR:           109 bpm. Exam Location:  Inpatient Procedure: 2D Echo, Cardiac Doppler and Color Doppler Indications:    R94.31 Abnormal EKG  History:        Patient has prior history of Echocardiogram examinations, most                 recent 05/08/2017. Risk Factors:Hypertension, Diabetes and                 Dyslipidemia. Pulmonary Embolism. GERD. Seizures.  Sonographer:    Tiffany Dance Referring Phys: [id number] Barman D Aanika Defoor  Sonographer Comments: No subcostal window and Technically difficult study due to poor echo windows. IMPRESSIONS  1. Moderate pericardial effusion. The pericardial effusion is anterior to the right ventricle. Mild RV diastolic compression without RV diastolic collapse. Unable to assess IVC or hepatic veins. Respirometer not performed. Study is suboptimal for assessment of constricitive physiology. Tachycardia noted. Consider repeat echo in 2-3 days or with clinical  deterioration.  2. Left ventricular ejection fraction, by estimation, is 60 to 65%. The left ventricle has normal function. Left ventricular endocardial border not optimally defined to evaluate regional wall motion. Indeterminate diastolic filling due to E-A fusion.  3. Right ventricular systolic function is normal. The right ventricular size is normal. Tricuspid regurgitation signal is inadequate for assessing PA pressure.  4. The mitral valve is normal in structure. No evidence of mitral valve regurgitation. No evidence of mitral stenosis.  5. The aortic valve was not well visualized. Aortic valve regurgitation is not visualized. No aortic stenosis is present. FINDINGS  Left Ventricle: Left ventricular ejection fraction, by estimation, is 60 to 65%. The left ventricle has normal function. Left ventricular endocardial border not optimally defined to evaluate regional wall motion. The left ventricular internal cavity size was normal in size. There is no left ventricular hypertrophy. Indeterminate diastolic filling due to E-A fusion. Right Ventricle: The right ventricular size is normal. No increase in right ventricular wall thickness. Right ventricular systolic function is normal. Tricuspid regurgitation signal is inadequate for assessing PA pressure. Left Atrium: Left atrial size was normal in size. Right Atrium: Right atrial size was normal in size. Pericardium: A moderately sized pericardial effusion is present. The pericardial effusion is anterior to the right ventricle. Presence of pericardial fat pad. Mitral Valve: The mitral valve is normal in structure. No evidence of mitral valve regurgitation. No evidence of mitral valve stenosis. Tricuspid Valve: The tricuspid valve is normal in structure. Tricuspid valve regurgitation is trivial. No evidence of tricuspid stenosis. Aortic Valve: The aortic valve was not well visualized. Aortic valve regurgitation is not visualized. No aortic stenosis is present. Pulmonic  Valve: The pulmonic valve was not well visualized. Pulmonic valve regurgitation is not visualized. No evidence of pulmonic stenosis. Aorta: The aortic root is normal in size and structure. Venous: The inferior vena cava was not well visualized. IAS/Shunts: The interatrial septum was not well visualized.  LEFT VENTRICLE PLAX 2D LVIDd:         4.18 cm  Diastology LVIDs:         3.41 cm  LV e' medial:    7.21 cm/s LV PW:         1.11 cm  LV E/e' medial:  11.2 LV IVS:        0.94 cm  LV e' lateral:   5.72 cm/s LVOT diam:     2.20 cm  LV E/e' lateral: 14.2 LV SV:  34 LV SV Index:   22 LVOT Area:     3.80 cm  RIGHT VENTRICLE RV Basal diam:  1.71 cm RV S prime:     12.10 cm/s TAPSE (M-mode): 1.9 cm LEFT ATRIUM         Index LA diam:    2.90 cm 1.86 cm/m  AORTIC VALVE LVOT Vmax:   57.80 cm/s LVOT Vmean:  37.200 cm/s LVOT VTI:    0.089 m  AORTA Ao Root diam: 3.30 cm MITRAL VALVE MV Area (PHT): 3.42 cm    SHUNTS MV Decel Time: 222 msec    Systemic VTI:  0.09 m MV E velocity: 81.10 cm/s  Systemic Diam: 2.20 cm Cherlynn Kaiser MD Electronically signed by Cherlynn Kaiser MD Signature Date/Time: 04/08/2020/3:48:08 PM    Final    VAS [place] LOWER EXTREMITY VENOUS (DVT)  Result Date: 04/09/2020  Lower Venous DVT Study Indications: Tachycardia.  Risk Factors: None identified. Limitations: Poor ultrasound/tissue interface and patient positioning, patient immobility, patient pain tolerance, severe leg contractures. Comparison Study: No prior studies. Performing Technologist: Oliver Hum RVT  Examination Guidelines: A complete evaluation includes B-mode imaging, spectral Doppler, color Doppler, and power Doppler as needed of all accessible portions of each vessel. Bilateral testing is considered an integral part of a complete examination. Limited examinations for reoccurring indications may be performed as noted. The reflux portion of the exam is performed with the patient in reverse Trendelenburg.   +---------+---------------+---------+-----------+----------+-------------------+ RIGHT    CompressibilityPhasicitySpontaneityPropertiesThrombus Aging      +---------+---------------+---------+-----------+----------+-------------------+ CFV      Full           Yes      Yes                                      +---------+---------------+---------+-----------+----------+-------------------+ SFJ      Full                                                             +---------+---------------+---------+-----------+----------+-------------------+ FV Prox  Full                                                             +---------+---------------+---------+-----------+----------+-------------------+ FV Mid   Full                                                             +---------+---------------+---------+-----------+----------+-------------------+ FV DistalFull                                                             +---------+---------------+---------+-----------+----------+-------------------+ PFV  Not well visualized +---------+---------------+---------+-----------+----------+-------------------+ POP      Full           No       No                                       +---------+---------------+---------+-----------+----------+-------------------+ PTV      Full                                                             +---------+---------------+---------+-----------+----------+-------------------+ PERO     Full                                                             +---------+---------------+---------+-----------+----------+-------------------+   +---------+---------------+---------+-----------+----------+-------------------+ LEFT     CompressibilityPhasicitySpontaneityPropertiesThrombus Aging      +---------+---------------+---------+-----------+----------+-------------------+  CFV      Full           Yes      Yes                                      +---------+---------------+---------+-----------+----------+-------------------+ SFJ      Full                                                             +---------+---------------+---------+-----------+----------+-------------------+ FV Prox  Full                                                             +---------+---------------+---------+-----------+----------+-------------------+ FV Mid   Full                                                             +---------+---------------+---------+-----------+----------+-------------------+ FV DistalFull                                                             +---------+---------------+---------+-----------+----------+-------------------+ PFV                                                   Not well visualized +---------+---------------+---------+-----------+----------+-------------------+ POP  No       No                                       +---------+---------------+---------+-----------+----------+-------------------+ PTV      Full                                                             +---------+---------------+---------+-----------+----------+-------------------+ PERO     Full                                                             +---------+---------------+---------+-----------+----------+-------------------+     Summary: RIGHT: - There is no evidence of deep vein thrombosis in the lower extremity. However, portions of this examination were limited- see technologist comments above.  - No cystic structure found in the popliteal fossa.  LEFT: - There is no evidence of deep vein thrombosis in the lower extremity. However, portions of this examination were limited- see technologist comments above.  - No cystic structure found in the popliteal fossa.  *See table(s) above for measurements and  observations. Electronically signed by Harold Barban MD on 04/09/2020 at 4:18:36 PM.    Final      Scheduled Meds:   . amoxicillin-clavulanate  875 mg Oral Q12H  . bethanechol  10 mg Oral TID  . Chlorhexidine Gluconate Cloth  6 each Topical Daily  . feeding supplement  237 mL Oral TID BM  . gabapentin  200 mg Oral Q12H  . levETIRAcetam  750 mg Oral BID  . mouth rinse  15 mL Mouth Rinse BID  . metoprolol tartrate  25 mg Oral BID  . mirtazapine  15 mg Oral QHS  . potassium chloride  40 mEq Oral Q4H  . rivaroxaban  20 mg Oral Q supper  . rosuvastatin  5 mg Oral Daily  . sertraline  50 mg Oral Daily  . traZODone  100 mg Oral QHS    Continuous Infusions:      LOS: 5 days     Vernell Leep, MD, [place], [facility]. [facility]    To contact the attending provider between 7A-7P or the covering provider during after hours 7P-7A, please log into the web site [email or web address] and access using universal [place] password for that web site. If you do not have the password, please call the hospital operator.  04/10/2020, 10:49 AM

## 2020-04-10 NOTE — Progress Notes (Signed)
Palliative Care Progress Note  Reason for visit: goals of care  Discussed with Dr. Waymon Amato this morning.  Rachel Vang reports now that she would like to proceed with PEG tube placement.  I saw and examined Rachel Vang today.  She was sitting in bed in no distress.  No family at the bedside.  I talked with her about plan for PEG tube moving forward.  She also confirms to me that she would like to proceed with PEG tube placement.  I asked her if she still had reservations about this and she indicated no.  I also asked her if she was feeling at peace with plan for proceeding with PEG tube placement for nutrition and hydration and she indicated yes.  She again affirms desire for aggressive interventions.  She was in agreement with me calling her son, Rachel Vang, to discuss as well.  I called and was able to reach patient's son, Rachel Vang.  He confirms that he had spoken with his mom about desire for PEG tube and she also has stated to him that she would like to proceed.  Carlton and I also discussed longer-term goals of care as well.  She has a MOST form on file that indicates desire for aggressive interventions, including feeding tube.  Prior, Rachel Vang I had discussed if this needed to be voided as she was saying that she did not want feeding tube in the future.  Now that she has changed her mind about this, he and I discussed and the current MOST form on file does line up with her wishes (as she has discussed with both me and Rachel Vang) moving forward.  We also discussed plan for palliative care follow-up as an outpatient to determine long term goals based upon how she does over the next few weeks.  - Full code/full scope - Plan for PEG placement this week - Recommend palliative care to continue to follow at skilled facility. - Recommend outpatient follow-up for dental issues.    Total time: 25 minutes  Greater than 50%  of this time was spent counseling and coordinating care related to the above  assessment and plan.  Romie Minus, MD [facility] Palliative Medicine Team [phone]

## 2020-04-10 NOTE — Progress Notes (Signed)
Progress Note  Patient Name: Rachel Vang Date of Encounter: 04/10/2020  [facility] Cardiologist: Chilton Si, MD   Subjective   Feeling well.  Complains of leg pain.  No chest pain, palpitations or shortness of breath.   Inpatient Medications    Scheduled Meds: . amoxicillin-clavulanate  875 mg Oral Q12H  . bethanechol  10 mg Oral TID  . Chlorhexidine Gluconate Cloth  6 each Topical Daily  . feeding supplement  237 mL Oral TID BM  . gabapentin  200 mg Oral Q12H  . levETIRAcetam  750 mg Oral BID  . mouth rinse  15 mL Mouth Rinse BID  . metoprolol tartrate  25 mg Oral BID  . mirtazapine  15 mg Oral QHS  . rivaroxaban  20 mg Oral Q supper  . rosuvastatin  5 mg Oral Daily  . sertraline  50 mg Oral Daily  . traZODone  100 mg Oral QHS   Continuous Infusions:  PRN Meds: acetaminophen **OR** acetaminophen, morphine injection, prochlorperazine   Vital Signs    Vitals:   04/09/20 1347 04/09/20 2205 04/10/20 0311 04/10/20 0609  BP: 121/90 117/79 101/74 (!) 123/91  Pulse: (!) 122 (!) 118 (!) 112 99  Resp: Temp: 98 F (36.7 C) 98.6 F (37 C) 98.8 F (37.1 C) 97.8 F (36.6 C)  TempSrc: Oral     SpO2: 96% 98% 97% 97%  Weight:      Height:        Intake/Output Summary (Last 24 hours) at 04/10/2020 0941 Last data filed at 04/10/2020 0900 Gross per 24 hour  Intake 440 ml  Output 200 ml  Net 240 ml   Last 3 Weights 04/04/2020 04/04/2019 11/19/2017  Weight (lbs) 111 lb 15.9 oz 112 lb 117 lb  Weight (kg) 50.8 kg 50.803 kg 53.071 kg      Telemetry    Sinus tachycardia.  Rate 100-120s - Personally Reviewed  ECG    n/a - Personally Reviewed  Physical Exam   VS:  BP (!) 123/91 (BP Location: Right Arm)   Pulse 99   Temp 97.8 F (36.6 C)   Resp 18   Ht  (1.676 m)   Wt 50.8 kg   LMP  (LMP Unknown)   SpO2 97%   BMI 18.08 kg/m  , BMI Body mass index is 18.08 kg/m. GENERAL: Chronically ill-appearing HEENT: Pupils equal round and  reactive, fundi not visualized, oral mucosa unremarkable NECK:  No jugular venous distention, waveform within normal limits, carotid upstroke brisk and symmetric, no bruits LUNGS:  Clear to auscultation bilaterally HEART:  Tachycardic.  Regular rhythm.  PMI not displaced or sustained,S1 and S2 within normal limits, no S3, no S4, no clicks, no rubs, no murmurs ABD:  Flat, positive bowel sounds normal in frequency in pitch, no bruits, no rebound, no guarding, no midline pulsatile mass, no hepatomegaly, no splenomegaly EXT:  2 plus pulses throughout, no edema, no cyanosis no clubbing SKIN:  No rashes no nodules NEURO:  L sided contracture and hemiparesis.  R facial droop PSYCH:  Cognitively intact, oriented to person place and time   Labs    High Sensitivity Troponin:  No results for input(s): TROPONINIHS in the last 720 hours.    Chemistry Recent Labs  Lab 04/05/20 1012 04/06/20 0256 04/07/20 0430 04/08/20 0532 04/09/20 0522 04/10/20 0535  NA 150* 144   < > 140 143 143  K 3.6 2.7*   < > 3.9 3.3* 3.0*  CL 114* 105   < > 107 110 109  CO2 27 27   < > 23 24 26   GLUCOSE 90 82   < > 106* 114* 121*  BUN 18 10   < > <5* <5* <5*  CREATININE 0.72 0.54   < > 0.53 0.51 0.58  CALCIUM 8.5* 8.5*   < > 8.7* 8.4* 8.7*  PROT 6.0* 6.5  --  6.6  --   --   ALBUMIN 2.8* 2.9*  --  2.7*  --   --   AST 18 19  --  16  --   --   ALT 8 11  --  13  --   --   ALKPHOS 89 106  --  107  --   --   BILITOT 0.9 0.8  --  0.8  --   --   GFRNONAA >60 >60   < > >60 >60 >60  ANIONGAP 9 12   < > 10 9 8    < > = values in this interval not displayed.     Hematology Recent Labs  Lab 04/07/20 0430 04/09/20 0522 04/10/20 0535  WBC 12.7* 7.7 7.4  RBC 3.34* 2.91* 2.95*  HGB 10.3* 8.9* 9.2*  HCT 32.6* 29.0* 29.2*  MCV 97.6 99.7 99.0  MCH 30.8 30.6 31.2  MCHC 31.6 30.7 31.5  RDW 14.8 15.8* 15.7*  PLT 123* 171 250    BNPNo results for input(s): BNP, PROBNP in the last 168 hours.   DDimer  Recent Labs  Lab  04/08/20 1120  DDIMER 0.64*     Radiology    CT ANGIO CHEST PE W OR WO CONTRAST  Result Date: 04/08/2020 CLINICAL DATA:  Right upper chest pain, positive D-dimer, history of deep venous thrombosis EXAM: CT ANGIOGRAPHY CHEST WITH CONTRAST TECHNIQUE: Multidetector CT imaging of the chest was performed using the standard protocol during bolus administration of intravenous contrast. Multiplanar CT image reconstructions and MIPs were obtained to evaluate the vascular anatomy. CONTRAST:  75mL OMNIPAQUE IOHEXOL 350 MG/ML SOLN COMPARISON:  04/04/2020, 05/06/2017 FINDINGS: Cardiovascular: This is a technically adequate evaluation of the pulmonary vasculature. No filling defects or pulmonary emboli. Trace pericardial fluid. The heart is not enlarged. Normal caliber of the thoracic aorta without dissection. Minimal atherosclerosis. Mediastinum/Nodes: No enlarged mediastinal, hilar, or axillary lymph nodes. Thyroid gland, trachea, and esophagus demonstrate no significant findings. Lungs/Pleura: Dependent areas of airspace disease are seen within the right upper and right lower lobes, corresponding to abnormality seen on recent chest x-ray. Findings could reflect infection or aspiration. No effusion or pneumothorax. Central airways are patent. Upper Abdomen: No acute abnormality. Musculoskeletal: No acute or destructive bony lesions. Reconstructed images demonstrate no additional findings. Review of the MIP images confirms the above findings. IMPRESSION: 1. No evidence of pulmonary embolus. 2. Dependent airspace disease within the right lung, consistent with infection or aspiration. 3.  Aortic Atherosclerosis (ICD10-I70.0). Electronically Signed   By: Sharlet SalinaMichael  Brown M.D.   On: 04/08/2020 21:31   ECHOCARDIOGRAM COMPLETE  Result Date: 04/08/2020    ECHOCARDIOGRAM REPORT   Patient Name:   Rachel Vang Date of Exam: 04/08/2020 Medical Rec #:  [id number]          Height:       66.0 in Accession #:    [id number]          Weight:       112.0 lb Date of Birth:  02/10/1965  BSA:          1.563 m Patient Age:    54 years           BP:           112/80 mmHg Patient Gender: F                  HR:           109 bpm. Exam Location:  Inpatient Procedure: 2D Echo, Cardiac Doppler and Color Doppler Indications:    R94.31 Abnormal EKG  History:        Patient has prior history of Echocardiogram examinations, most                 recent 05/08/2017. Risk Factors:Hypertension, Diabetes and                 Dyslipidemia. Pulmonary Embolism. GERD. Seizures.  Sonographer:    Tiffany Dance Referring Phys: [id number] ANAND D HONGALGI  Sonographer Comments: No subcostal window and Technically difficult study due to poor echo windows. IMPRESSIONS  1. Moderate pericardial effusion. The pericardial effusion is anterior to the right ventricle. Mild RV diastolic compression without RV diastolic collapse. Unable to assess IVC or hepatic veins. Respirometer not performed. Study is suboptimal for assessment of constricitive physiology. Tachycardia noted. Consider repeat echo in 2-3 days or with clinical deterioration.  2. Left ventricular ejection fraction, by estimation, is 60 to 65%. The left ventricle has normal function. Left ventricular endocardial border not optimally defined to evaluate regional wall motion. Indeterminate diastolic filling due to E-A fusion.  3. Right ventricular systolic function is normal. The right ventricular size is normal. Tricuspid regurgitation signal is inadequate for assessing PA pressure.  4. The mitral valve is normal in structure. No evidence of mitral valve regurgitation. No evidence of mitral stenosis.  5. The aortic valve was not well visualized. Aortic valve regurgitation is not visualized. No aortic stenosis is present. FINDINGS  Left Ventricle: Left ventricular ejection fraction, by estimation, is 60 to 65%. The left ventricle has normal function. Left ventricular endocardial border not optimally defined to evaluate  regional wall motion. The left ventricular internal cavity size was normal in size. There is no left ventricular hypertrophy. Indeterminate diastolic filling due to E-A fusion. Right Ventricle: The right ventricular size is normal. No increase in right ventricular wall thickness. Right ventricular systolic function is normal. Tricuspid regurgitation signal is inadequate for assessing PA pressure. Left Atrium: Left atrial size was normal in size. Right Atrium: Right atrial size was normal in size. Pericardium: A moderately sized pericardial effusion is present. The pericardial effusion is anterior to the right ventricle. Presence of pericardial fat pad. Mitral Valve: The mitral valve is normal in structure. No evidence of mitral valve regurgitation. No evidence of mitral valve stenosis. Tricuspid Valve: The tricuspid valve is normal in structure. Tricuspid valve regurgitation is trivial. No evidence of tricuspid stenosis. Aortic Valve: The aortic valve was not well visualized. Aortic valve regurgitation is not visualized. No aortic stenosis is present. Pulmonic Valve: The pulmonic valve was not well visualized. Pulmonic valve regurgitation is not visualized. No evidence of pulmonic stenosis. Aorta: The aortic root is normal in size and structure. Venous: The inferior vena cava was not well visualized. IAS/Shunts: The interatrial septum was not well visualized.  LEFT VENTRICLE PLAX 2D LVIDd:         4.18 cm  Diastology LVIDs:         3.41 cm  LV e'  medial:    7.21 cm/s LV PW:         1.11 cm  LV E/e' medial:  11.2 LV IVS:        0.94 cm  LV e' lateral:   5.72 cm/s LVOT diam:     2.20 cm  LV E/e' lateral: 14.2 LV SV:         34 LV SV Index:   22 LVOT Area:     3.80 cm  RIGHT VENTRICLE RV Basal diam:  1.71 cm RV S prime:     12.10 cm/s TAPSE (M-mode): 1.9 cm LEFT ATRIUM         Index LA diam:    2.90 cm 1.86 cm/m  AORTIC VALVE LVOT Vmax:   57.80 cm/s LVOT Vmean:  37.200 cm/s LVOT VTI:    0.089 m  AORTA Ao Root diam:  3.30 cm MITRAL VALVE MV Area (PHT): 3.42 cm    SHUNTS MV Decel Time: 222 msec    Systemic VTI:  0.09 m MV E velocity: 81.10 cm/s  Systemic Diam: 2.20 cm Weston Brass MD Electronically signed by Weston Brass MD Signature Date/Time: 04/08/2020/3:48:08 PM    Final    VAS [place] LOWER EXTREMITY VENOUS (DVT)  Result Date: 04/09/2020  Lower Venous DVT Study Indications: Tachycardia.  Risk Factors: None identified. Limitations: Poor ultrasound/tissue interface and patient positioning, patient immobility, patient pain tolerance, severe leg contractures. Comparison Study: No prior studies. Performing Technologist: Chanda Busing RVT  Examination Guidelines: A complete evaluation includes B-mode imaging, spectral Doppler, color Doppler, and power Doppler as needed of all accessible portions of each vessel. Bilateral testing is considered an integral part of a complete examination. Limited examinations for reoccurring indications may be performed as noted. The reflux portion of the exam is performed with the patient in reverse Trendelenburg.  +---------+---------------+---------+-----------+----------+-------------------+ RIGHT    CompressibilityPhasicitySpontaneityPropertiesThrombus Aging      +---------+---------------+---------+-----------+----------+-------------------+ CFV      Full           Yes      Yes                                      +---------+---------------+---------+-----------+----------+-------------------+ SFJ      Full                                                             +---------+---------------+---------+-----------+----------+-------------------+ FV Prox  Full                                                             +---------+---------------+---------+-----------+----------+-------------------+ FV Mid   Full                                                             +---------+---------------+---------+-----------+----------+-------------------+ FV  DistalFull                                                             +---------+---------------+---------+-----------+----------+-------------------+  PFV                                                   Not well visualized +---------+---------------+---------+-----------+----------+-------------------+ POP      Full           No       No                                       +---------+---------------+---------+-----------+----------+-------------------+ PTV      Full                                                             +---------+---------------+---------+-----------+----------+-------------------+ PERO     Full                                                             +---------+---------------+---------+-----------+----------+-------------------+   +---------+---------------+---------+-----------+----------+-------------------+ LEFT     CompressibilityPhasicitySpontaneityPropertiesThrombus Aging      +---------+---------------+---------+-----------+----------+-------------------+ CFV      Full           Yes      Yes                                      +---------+---------------+---------+-----------+----------+-------------------+ SFJ      Full                                                             +---------+---------------+---------+-----------+----------+-------------------+ FV Prox  Full                                                             +---------+---------------+---------+-----------+----------+-------------------+ FV Mid   Full                                                             +---------+---------------+---------+-----------+----------+-------------------+ FV DistalFull                                                             +---------+---------------+---------+-----------+----------+-------------------+ PFV  Not well visualized  +---------+---------------+---------+-----------+----------+-------------------+ POP                     No       No                                       +---------+---------------+---------+-----------+----------+-------------------+ PTV      Full                                                             +---------+---------------+---------+-----------+----------+-------------------+ PERO     Full                                                             +---------+---------------+---------+-----------+----------+-------------------+     Summary: RIGHT: - There is no evidence of deep vein thrombosis in the lower extremity. However, portions of this examination were limited- see technologist comments above.  - No cystic structure found in the popliteal fossa.  LEFT: - There is no evidence of deep vein thrombosis in the lower extremity. However, portions of this examination were limited- see technologist comments above.  - No cystic structure found in the popliteal fossa.  *See table(s) above for measurements and observations. Electronically signed by Coral Else MD on 04/09/2020 at 4:18:36 PM.    Final     Cardiac Studies   Echo 04/08/20: 1. Moderate pericardial effusion. The pericardial effusion is anterior to  the right ventricle. Mild RV diastolic compression without RV diastolic  collapse. Unable to assess IVC or hepatic veins. Respirometer not  performed. Study is suboptimal for  assessment of constricitive physiology. Tachycardia noted. Consider repeat  echo in 2-3 days or with clinical deterioration.  2. Left ventricular ejection fraction, by estimation, is 60 to 65%. The  left ventricle has normal function. Left ventricular endocardial border  not optimally defined to evaluate regional wall motion. Indeterminate  diastolic filling due to E-A fusion.  3. Right ventricular systolic function is normal. The right ventricular  size is normal. Tricuspid regurgitation  signal is inadequate for assessing  PA pressure.  4. The mitral valve is normal in structure. No evidence of mitral valve  regurgitation. No evidence of mitral stenosis.  5. The aortic valve was not well visualized. Aortic valve regurgitation  is not visualized. No aortic stenosis is present.   Patient Profile     55 y.o. female with a hx of hypertension, hyperlipidemia, subarachnoid hemorrhage, left hemiparesis, diabetes, epilepsy, and DVT who is being seen today for the evaluation of tachycardia and pericardial effusion.  Assessment & Plan   # Sinus tachycardia: # Pericardial effusion:  # Hypertension:  Initially her sinus tachycardia was appropriate in the setting of intravascular volume depletion (serum sodium 158) and sepsis.  However both have been resolved.  IVC is not well-visualized on her echo, but it seems to be at the upper limit of normal in size.  Would not continue aggressive IV fluids.  Agree, that pain from contractures likely does contribute somewhat, though she appears  generally comfortable.  She complains of some pain in her legs that is chronic.  She also has chronic anemia.  Her hemoglobin was initially 14, though this was likely hemoconcentrated.  Recently it has been stable in the 10s.  It dropped to 8.9 and has now stabilized at 9.2.  No obvious sign of bleeding.  The underlying cause of her tachycardia is unclear.  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  She has normal systolic function on echo.  Therefore there is no urgent need to treat it.  She does have a pericardial effusion that does not seem to be consistent with tamponade.  Would repeat the echo in a couple weeks to make sure it is not progressing.  Metoprolol 25 mg twice daily was started and her HR is slowly downtrending.  Continue current dose for now.  # Prior DVT/PE:  Negative for PE/DVT this admission.  Continue Xarelto.       For questions or updates, please contact [facility] Please consult [email or web address] for contact info under        Signed, Chilton Si, MD  04/10/2020, 9:41 AM

## 2020-04-11 DIAGNOSIS — R627 Adult failure to thrive: Secondary | ICD-10-CM

## 2020-04-11 LAB — CBC
HCT: 30.3 % — ABNORMAL LOW (ref 36.0–46.0)
Hemoglobin: 9.2 g/dL — ABNORMAL LOW (ref 12.0–15.0)
MCH: 30.4 pg (ref 26.0–34.0)
MCHC: 30.4 g/dL (ref 30.0–36.0)
MCV: 100 fL (ref 80.0–100.0)
Platelets: 279 10*3/uL (ref 150–400)
RBC: 3.03 MIL/uL — ABNORMAL LOW (ref 3.87–5.11)
RDW: 16.2 % — ABNORMAL HIGH (ref 11.5–15.5)
WBC: 6.7 10*3/uL (ref 4.0–10.5)
nRBC: 0 % (ref 0.0–0.2)

## 2020-04-11 LAB — GLUCOSE, CAPILLARY
Glucose-Capillary: 101 mg/dL — ABNORMAL HIGH (ref 70–99)
Glucose-Capillary: 130 mg/dL — ABNORMAL HIGH (ref 70–99)
Glucose-Capillary: 151 mg/dL — ABNORMAL HIGH (ref 70–99)
Glucose-Capillary: 96 mg/dL (ref 70–99)

## 2020-04-11 LAB — BASIC METABOLIC PANEL
Anion gap: 9 (ref 5–15)
BUN: <5 mg/dL — ABNORMAL LOW (ref 6–20)
CO2: 25 mmol/L (ref 22–32)
Calcium: 9.1 mg/dL (ref 8.9–10.3)
Chloride: 110 mmol/L (ref 98–111)
Creatinine, Ser: 0.55 mg/dL (ref 0.44–1.00)
GFR, Estimated: >60 mL/min (ref >60)
Glucose, Bld: 118 mg/dL — ABNORMAL HIGH (ref 70–99)
Potassium: 3.9 mmol/L (ref 3.5–5.1)
Sodium: 144 mmol/L (ref 135–145)

## 2020-04-11 LAB — HEPARIN LEVEL (UNFRACTIONATED): Heparin Unfractionated: 0.14 IU/mL — ABNORMAL LOW (ref 0.30–0.70)

## 2020-04-11 LAB — APTT: aPTT: 34 seconds (ref 24–36)

## 2020-04-11 MED ORDER — METOPROLOL TARTRATE 25 MG/10 ML ORAL SUSPENSION
37.5000 mg | Freq: Two times a day (BID) | ORAL | Status: DC
  Administered 2020-04-11 – 2020-04-12 (×3): 37.5 mg via ORAL
  Filled 2020-04-11: qty 20
  Filled 2020-04-11: qty 15
  Filled 2020-04-11 (×2): qty 20
  Filled 2020-04-11: qty 15

## 2020-04-11 MED ORDER — SODIUM CHLORIDE 0.9 % IV SOLN: INTRAVENOUS | Status: DC

## 2020-04-11 MED ORDER — HEPARIN (PORCINE) 25000 UT/250ML-% IV SOLN
950.0000 [IU]/h | INTRAVENOUS | Status: AC
  Administered 2020-04-11: 800 [IU]/h via INTRAVENOUS
  Administered 2020-04-12: 950 [IU]/h via INTRAVENOUS
  Filled 2020-04-11 (×2): qty 250

## 2020-04-11 NOTE — Progress Notes (Signed)
PROGRESS NOTE   Rachel Vang  NGE:[id number]    DOB: 11/29/1965    DOA: 04/04/2020  PCP: Patient, No Pcp Per   I have briefly reviewed patients previous medical records in [facility].  Chief Complaint  Patient presents with  . Failure To Thrive    Brief Narrative:  55 year old female, medical history including but not limited to pulmonary embolism, DVT, type II DM, epilepsy, GERD, HLD, HTN, nontraumatic subarachnoid hemorrhage, left hemiparesis, IBS, dysphagia,?  Nursing home resident, nonambulatory/wheelchair mobile, presented to the ED due to not eating or drinking for past 1 week PTA.  Admitted for sepsis due to aspiration pneumonia from pre-existing dysphagia, dehydration with hypernatremia.  Clinically improved.  Transferred to medical bed 3/10.  Ongoing poor oral intake/pocketing of food.  Also persistent sinus tachycardia, unclear etiology, lower extremity Dopplers and CTA negative for DVT and PE respectively, 2D echo shows moderate pericardial effusion without tamponade features, TSH normal.  Cardiology consulted, started low-dose beta-blockers, improving.  Ongoing dysphagia issues, inconsistent oral intake, difficulty swallowing some of the pills.  Patient finally agreeable to PEG tube placement, IR consulted and plan for procedure on 3/16.   Assessment & Plan:  Principal Problem:   Sepsis due to pneumonia [facility]) Active Problems:   Seizures ([place])   DVT (deep venous thrombosis) (HCC)   Type II diabetes mellitus with neurological manifestations ([place])   Essential hypertension, benign   Chronic pulmonary embolism (HCC)   Dyslipidemia associated with type 2 diabetes mellitus (HCC)   Hypernatremia   Inappropriate sinus tachycardia   Hemiplegia as late effect of cerebrovascular accident (CVA) ([place])   Hypokalemia   Prolonged QT interval   Malnutrition of moderate degree   Aspiration pneumonia (HCC)   Severe sepsis, POA, secondary to aspiration pneumonia from pre-existing  dysphagia:  Met sepsis criteria on admission including fever of 100.3 F, tachypnea in the 20s-occasionally low 30s, tachycardic and hypoxic at 89% on 10 L/min oxygen/NRB.  Neutrophilic leukocytosis/WBC 14.3 on admission.  Lactate peaked to 6.7.  Flu panel and COVID-19 PCR negative.  Urine streptococcal antigen negative.  Blood cultures remain negative today.  MRSA PCR negative.  Urine microscopy not suggestive of UTI and culture showed multiple species.  HIV screen nonreactive.  S/p IV azithromycin x1 dose and ceftriaxone x1 dose on 3/7.  Then treated with IV Unasyn 3/7 >.  Completed approximately 3 days of IV antibiotics.  Although intermittent low-grade fevers, she looks good, not septic or toxic looking, tachycardia switched to Augmentin 3/10 x5 days.  Augmentin probably almost complete-may have 2 more doses left.  Recommend follow-up of chest x-ray in 4 weeks.  CTA chest 3/11 shows dependent airspace disease on the right side suggestive of aspiration.  Improved.  Discussed with patient and son at bedside 3/11 that patient remains at high risk for recurrent aspiration due to underlying dysphagia issues and also cognitive impairment.  Inappropriate sinus tachycardia/pericardial effusion  Asymptomatic.  Unclear etiology.  Likely multifactorial-pain at times, anemia.  Sepsis resolved.  Clinically euvolemic.  Given prior history of DVT/PE on Xarelto, bedbound status, lower extremity contractures, at risk for VTE's, checked D-dimer which was positive, bilateral lower extremity venous Dopplers negative for DVT and CTA chest negative for PE.  2D echo showed normal EF but moderate pericardial effusion without tamponade features.    At risk for rate related cardiomyopathy.  Cardiology uptitrating metoprolol, increased to 37.5 mg twice daily.  Per cardiology, repeat echo in 2 weeks to follow pericardial effusion.  Improving.  Dysphagia  Chronic.?  Related to prior subarachnoid hemorrhage.   Not sure if she had a CVA.  Speech therapy follow-up appreciated and have advance diet to dysphagia 2 diet and thin liquids with which hopefully her oral intake will improve.  Dr. Domingo Cocking, palliative care MD and I had separate and detailed conversation with patient regarding PEG tube which she had repeatedly declined.  She reportedly had a PEG tube in the past which had to be removed due to leakage around the tube.  Speech therapy follow-up appreciated and recommend ongoing dysphagia 2 (fine chopped), dysphagia 1 (pured) and thin liquids.  Patient noted to be pocketing food.  Highly inconsistent oral intake along with difficulty taking some of her oral meds.  Those that could be switched have been changed to liquid preparation.  Per RN report, all day yesterday did not eat or drink much at all which was associated with decreased urine output.  Started IVF today pending PEG tube placement on 3/16.  Patient and family now agreeable to a PEG tube placement.    IR consulted for percutaneous gastrostomy tube placement, plan for 3/16, recommend holding Xarelto 24 hours prior.  Pharmacy consulted, held Xarelto, starting IV heparin infusion tonight with plans to hold it 4 hours prior to percutaneous gastrostomy tube placement on 3/16.  Dehydration with hypernatremia  Secondary to poor oral intake.  Presented with serum sodium of 158.  Treated with hypotonic/half-normal saline.  Borderline hydration.  Ongoing very poor oral intake.  Started IV fluids again  Will be at risk of recurrent dehydration if has inconsistent oral intake.  Hypokalemia  Replaced.  Periodically follow BMP.  Magnesium 1.8.  Lactic acidosis:  Secondary to sepsis and acute respiratory failure.  Lactate peaked to 6.7.  Treated per sepsis protocol with aggressive IV fluids and improved.  Acute respiratory failure with hypoxia  Secondary to aspiration pneumonia  Resolved.  Seizure disorder  Continue prior home  dose of Keppra.  Essential hypertension  Mildly uncontrolled at times  Dyslipidemia  Continue home rosuvastatin.  Type II DM  Well-controlled.  History of DVT/PE  Briefly on full dose Lovenox which has been switched back to her home dose of Xarelto.     Xarelto held and will be started on IV heparin bridging pending gastrostomy tube placement 3/16.  Prolonged QTC  Improved, EKG 3/10: QTC 456 ms  Continue to replace low potassium.  Avoid QT prolonging medications.  Adult failure to thrive  Likely major component being poor oral intake related to dysphagia.  Palliative care MD input appreciated: Full code/full scope, family to complete MOST form when able, palliative care to continue to follow at SNF.  Communicated with palliative MD regarding patient's consent for PEG tube.  Anemia of chronic disease  Hemoglobin stable in the low 9 g range.  Thrombocytopenia  Possibly related to sepsis.  Resolved.  Diarrhea  Resolved.  Body mass index is 18.08 kg/m.  Nutritional Status Nutrition Problem: Moderate Malnutrition Etiology: chronic illness,dysphagia Signs/Symptoms: mild fat depletion,moderate muscle depletion Interventions: Refer to RD note for recommendations    DVT prophylaxis:   Full dose Lovenox   Code Status: Full Code Family Communication: I discussed with patient's son in detail on 3/13 via phone, updated care and answered questions.  He indicated that he discussed in detail with his mother last night and may have convinced her to change her mind.  Both are now agreeable for PEG tube placement. Disposition:  Status is: Inpatient  Remains inpatient appropriate because:Inpatient level  of care appropriate due to severity of illness   Dispo: The patient is from: LTC SNF at [facility]              Anticipated d/c is to: Return to LTC SNF at [facility] pending percutaneous gastrostomy tube placement and when tolerating tube feeds.  Possibly  towards the end of this week.              Patient currently is not medically stable to d/c.   Difficult to place patient No        Consultants:   Palliative care medicine Cardiology IR-pending  Procedures:   None  Antimicrobials:    Anti-infectives (From admission, onward)   Start     Dose/Rate Route Frequency Ordered Stop   04/08/20 1030  amoxicillin-clavulanate (AUGMENTIN) 400-57 MG/5ML suspension 875 mg        875 mg Oral Every 12 hours 04/08/20 0935     04/07/20 2200  amoxicillin-clavulanate (AUGMENTIN) 875-125 MG per tablet 1 tablet  Status:  Discontinued        1 tablet Oral Every 12 hours 04/07/20 1417 04/08/20 0935   04/05/20 0400  Ampicillin-Sulbactam (UNASYN) 3 g in sodium chloride 0.9 % 100 mL IVPB  Status:  Discontinued        3 g 200 mL/hr over 30 Minutes Intravenous Every 8 hours 04/04/20 2047 04/07/20 1417   04/04/20 1700  cefTRIAXone (ROCEPHIN) 2 g in sodium chloride 0.9 % 100 mL IVPB  Status:  Discontinued        2 g 200 mL/hr over 30 Minutes Intravenous Every 24 hours 04/04/20 1657 04/04/20 2026   04/04/20 1700  azithromycin (ZITHROMAX) 500 mg in sodium chloride 0.9 % 250 mL IVPB  Status:  Discontinued        500 mg 250 mL/hr over 60 Minutes Intravenous Every 24 hours 04/04/20 1657 04/04/20 2026        Subjective:  Sleeping this morning.  Arousable, reports feeling cold, covered with blanket.  Denies any other complaints.  As per RN report, did not eat much all day at all and had reduced urine output  Objective:   Vitals:   04/10/20 0311 04/10/20 0609 04/10/20 1543 04/11/20 0411  BP: 101/74 (!) 123/91 (!) 131/97 (!) 139/94  Pulse: (!) 112 99 94 97  Resp: 18 18 20 20   Temp: 98.8 F (37.1 C) 97.8 F (36.6 C) 98.1 F (36.7 C) 98.4 F (36.9 C)  TempSrc:   Oral Oral  SpO2: 97% 97% 99% 97%  Weight:      Height:        General exam: Young female, small built and frail, lying comfortably propped up in bed without distress.  Oral mucosa with  borderline hydration. ENT: As examined on 3/11: Pocketing food.  After she was made to spit out all of it there was still some left in the mouth.  She just has 4-5 front teeth on the top and below, difficult to clearly examine because of food residue but appears that her teeth require cleaning but did not really notice any acute findings or caries.  No other acute findings noted. Respiratory system: Clear to auscultation.  No increased work of breathing. Cardiovascular system: S1 and S2 heard, RRR.  No JVD or murmurs.  Telemetry personally reviewed: SR in the 90s mostly and occasional ST up to 120s.  Better than prior.  No pedal edema Gastrointestinal system: Abdomen is nondistended, soft and nontender.  No organomegaly or masses  appreciated.  Normal bowel sounds heard. Central nervous system: Mental status as noted above.  Does not say much.  Follows simple instructions.  No focal deficits. Extremities: Grade 5 x 5 power in right upper extremity, 4+ by 5 left hand grip.  Bilateral lower extremity with significant contractures. Skin: No rashes, lesions or ulcers Psychiatry: Judgement and insight appear impaired. Mood & affect pleasant and appropriate today.    Data Reviewed:   I have personally reviewed following labs and imaging studies   CBC: Recent Labs  Lab 04/04/20 1725 04/05/20 1012 04/06/20 0256 04/09/20 0522 04/10/20 0535 04/11/20 0456  WBC 14.3* 13.8*   < > 7.7 7.4 6.7  NEUTROABS 11.2* 10.8*  --   --   --   --   HGB 14.4 10.9*   < > 8.9* 9.2* 9.2*  HCT 47.2* 36.7   < > 29.0* 29.2* 30.3*  MCV 99.8 105.2*   < > 99.7 99.0 100.0  PLT 214 111*   < > 171 250 279   < > = values in this interval not displayed.    Basic Metabolic Panel: Recent Labs  Lab 04/06/20 0256 04/07/20 0430 04/07/20 1133 04/09/20 0522 04/10/20 0535 04/11/20 0456  NA 144 137   < > 143 143 144  K 2.7* 2.9*   < > 3.3* 3.0* 3.9  CL 105 101   < > 110 109 110  CO2 27 26   < > 24 26 25   GLUCOSE 82 101*    < > 114* 121* 118*  BUN 10 6   < > <5* <5* <5*  CREATININE 0.54 0.53   < > 0.51 0.58 0.55  CALCIUM 8.5* 8.0*   < > 8.4* 8.7* 9.1  MG 1.7 2.1  --   --  1.8  --    < > = values in this interval not displayed.    Liver Function Tests: Recent Labs  Lab 04/05/20 1012 04/06/20 0256 04/08/20 0532  AST 18 19 16   ALT 8 11 13   ALKPHOS 89 106 107  BILITOT 0.9 0.8 0.8  PROT 6.0* 6.5 6.6  ALBUMIN 2.8* 2.9* 2.7*    CBG: Recent Labs  Lab 04/11/20 0009 04/11/20 0728 04/11/20 1146  GLUCAP 130* 101* 151*    Microbiology Studies:   Recent Results (from the past 240 hour(s))  Blood culture (routine x 2)     Status: None   Collection Time: 04/04/20  5:25 PM   Specimen: BLOOD RIGHT FOREARM  Result Value Ref Range Status   Specimen Description   Final    BLOOD RIGHT FOREARM Performed at [facility], [place] [address] [place], [place] [address]    Special Requests   Final    BACTERIAL CASTS Blood Culture adequate volume Performed at [place] [address] [place], [place] [address]    Culture   Final    NO GROWTH 5 DAYS Performed at [facility], [place] [address], [place], [place] [address]    Report Status 04/09/2020 FINAL  Final  Blood culture (routine x 2)     Status: None   Collection Time: 04/04/20  5:25 PM   Specimen: BLOOD LEFT FOREARM  Result Value Ref Range Status   Specimen Description   Final    BLOOD LEFT FOREARM Performed at [place] [address] [place],  [address]    Special Requests   Final    BLOOD Blood Culture results may not  be optimal due to an inadequate volume of blood received in culture bottles Performed at [place] [address], [place], [place] [address]    Culture   Final    NO GROWTH 5 DAYS Performed at [facility], [place] [address], [place], [place] [address]    Report Status 04/09/2020 FINAL  Final  Resp Panel by RT-PCR (Flu A&B,  Covid) Nasopharyngeal Swab     Status: None   Collection Time: 04/04/20  5:25 PM   Specimen: Nasopharyngeal Swab; Nasopharyngeal(NP) swabs in vial transport medium  Result Value Ref Range Status   SARS Coronavirus 2 by RT PCR NEGATIVE NEGATIVE Final    Comment: (NOTE) SARS-CoV-2 target nucleic acids are NOT DETECTED.  The SARS-CoV-2 RNA is generally detectable in upper respiratory specimens during the acute phase of infection. The lowest concentration of SARS-CoV-2 viral copies this assay can detect is 138 copies/mL. A negative result does not preclude SARS-Cov-2 infection and should not be used as the sole basis for treatment or other patient management decisions. A negative result may occur with  improper specimen collection/handling, submission of specimen other than nasopharyngeal swab, presence of viral mutation(s) within the areas targeted by this assay, and inadequate number of viral copies(<138 copies/mL). A negative result must be combined with clinical observations, patient history, and epidemiological information. The expected result is Negative.  Fact Sheet for Patients:  EntrepreneurPulse.com.au  Fact Sheet for Healthcare Providers:  IncredibleEmployment.be  This test is no t yet approved or cleared by the [place] FDA and  has been authorized for detection and/or diagnosis of SARS-CoV-2 by FDA under an Emergency Use Authorization (EUA). This EUA will remain  in effect (meaning this test can be used) for the duration of the COVID-19 declaration under Section 564(b)(1) of the Act, 21 U.S.C.section 360bbb-3(b)(1), unless the authorization is terminated  or revoked sooner.       Influenza A by PCR NEGATIVE NEGATIVE Final   Influenza B by PCR NEGATIVE NEGATIVE Final    Comment: (NOTE) The Xpert Xpress SARS-CoV-2/FLU/RSV plus assay is intended as an aid in the diagnosis of influenza from Nasopharyngeal swab specimens and should  not be used as a sole basis for treatment. Nasal washings and aspirates are unacceptable for Xpert Xpress SARS-CoV-2/FLU/RSV testing.  Fact Sheet for Patients: EntrepreneurPulse.com.au  Fact Sheet for Healthcare Providers: IncredibleEmployment.be  This test is not yet approved or cleared by the [place] FDA and has been authorized for detection and/or diagnosis of SARS-CoV-2 by FDA under an Emergency Use Authorization (EUA). This EUA will remain in effect (meaning this test can be used) for the duration of the COVID-19 declaration under Section 564(b)(1) of the Act, 21 U.S.C. section 360bbb-3(b)(1), unless the authorization is terminated or revoked.  Performed at [facility], [place] [address], [place], [place] [address]   Urine culture     Status: Abnormal   Collection Time: 04/04/20 10:09 PM   Specimen: In/Out Cath Urine  Result Value Ref Range Status   Specimen Description   Final    IN/OUT CATH URINE Performed at [place] [address], [place], [place] [address]    Special Requests   Final    NONE Performed at [facility], [place] [address] [place], [place] [address]    Culture MULTIPLE SPECIES PRESENT, SUGGEST RECOLLECTION (A)  Final   Report Status 04/06/2020 FINAL  Final  MRSA PCR Screening     Status: None  Collection Time: 04/04/20 11:20 PM   Specimen: Nasopharyngeal  Result Value Ref Range Status   MRSA by PCR NEGATIVE NEGATIVE Final    Comment:        The GeneXpert MRSA Assay (FDA approved for NASAL specimens only), is one component of a comprehensive MRSA colonization surveillance program. It is not intended to diagnose MRSA infection nor to guide or monitor treatment for MRSA infections. Performed at [facility], [place] [address], [place], [place] [address]   SARS CORONAVIRUS 2 (TAT 6-24 HRS) Nasopharyngeal Nasopharyngeal Swab      Status: None   Collection Time: 04/09/20  6:15 AM   Specimen: Nasopharyngeal Swab  Result Value Ref Range Status   SARS Coronavirus 2 NEGATIVE NEGATIVE Final    Comment: (NOTE) SARS-CoV-2 target nucleic acids are NOT DETECTED.  The SARS-CoV-2 RNA is generally detectable in upper and lower respiratory specimens during the acute phase of infection. Negative results do not preclude SARS-CoV-2 infection, do not rule out co-infections with other pathogens, and should not be used as the sole basis for treatment or other patient management decisions. Negative results must be combined with clinical observations, patient history, and epidemiological information. The expected result is Negative.  Fact Sheet for Patients: SugarRoll.be  Fact Sheet for Healthcare Providers: https://www.woods-mathews.com/  This test is not yet approved or cleared by the [place] FDA and  has been authorized for detection and/or diagnosis of SARS-CoV-2 by FDA under an Emergency Use Authorization (EUA). This EUA will remain  in effect (meaning this test can be used) for the duration of the COVID-19 declaration under Se ction 564(b)(1) of the Act, 21 U.S.C. section 360bbb-3(b)(1), unless the authorization is terminated or revoked sooner.  Performed at [facility], [place] [address] [place], [place] [address]      Radiology Studies:  No results found.   Scheduled Meds:   . amoxicillin-clavulanate  875 mg Oral Q12H  . bethanechol  10 mg Oral TID  . Chlorhexidine Gluconate Cloth  6 each Topical Daily  . feeding supplement  237 mL Oral TID BM  . gabapentin  200 mg Oral Q12H  . levETIRAcetam  750 mg Oral BID  . mouth rinse  15 mL Mouth Rinse BID  . metoprolol tartrate  37.5 mg Oral BID  . mirtazapine  15 mg Oral QHS  . rosuvastatin  5 mg Oral Daily  . sertraline  50 mg Oral Daily  . traZODone  100 mg Oral QHS    Continuous Infusions:   . sodium  chloride 75 mL/hr at 04/11/20 0757  . heparin       LOS: 6 days     Vernell Leep, MD, [place], [facility]. [facility]    To contact the attending provider between 7A-7P or the covering provider during after hours 7P-7A, please log into the web site [email or web address] and access using universal  password for that web site. If you do not have the password, please call the hospital operator.  04/11/2020, 12:58 PM

## 2020-04-11 NOTE — Progress Notes (Signed)
ANTICOAGULATION CONSULT NOTE  Pharmacy Consult for heparin Indication: hx pulmonary embolus and DVT  No Known Allergies  Patient Measurements: Height: 5\' 6"  (167.6 cm) Weight: 50.8 kg (111 lb 15.9 oz) IBW/kg (Calculated) : 59.3 Heparin Dosing Weight: 51 kg  Vital Signs: Temp: 98.4 F (36.9 C) (03/14 0411) Temp Source: Oral (03/14 0411) BP: 139/94 (03/14 0411) Pulse Rate: 97 (03/14 0411)  Labs: Recent Labs    04/09/20 0522 04/10/20 0535 04/11/20 0456  HGB 8.9* 9.2* 9.2*  HCT 29.0* 29.2* 30.3*  PLT 171 250 279  CREATININE 0.51 0.58 0.55    Estimated Creatinine Clearance: 63 mL/min (by C-G formula based on SCr of 0.55 mg/dL).   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 Xarelto resumed on admission.  Plan is for PEG placement on 3/16 at 10a and IR recommends to hold xarelto at least 24 hrs prior to procedure. Pharmacy has been consulted to bridge with heparin drip while xarelto is on hold.  - last dose of xarelto taken at 1848 on 3/13   Goal of Therapy:  Heparin level 0.3-0.7 units/ml aPTT 66-102 seconds Monitor platelets by anticoagulation protocol: Yes   Plan:  - d/c xarelto - baseline aPTT and heparin level at 1730 today - start heparin drip at 800 units/hr at 1900 - check 6 hr heparin level and aPTT after starting heparin drip - Spoke to 4/16 from 4/13. She recom. to hold heparin drip at 0600 on 3/16 prior for PEG placement at 1000   Pham, Anh P 04/11/2020,10:28 AM

## 2020-04-11 NOTE — Progress Notes (Addendum)
No urine out this shift. Bladder scan showed . Did not cath for this amount. Pt drank 200 ml this shift. That is all she wanted to drink.   Pt says pain is worse than usual but denies morphine as tylenol helps her more. Pain general 8-tylenol given and pt asleep. Pt moans/grimaces/yells when gently being turned due to chronic pain.   Night provider notified.

## 2020-04-11 NOTE — Progress Notes (Signed)
  Speech Language Pathology Treatment: Dysphagia  Patient Details Name: Rachel Vang MRN: [id number] DOB: [birth year] Today's Date: 04/11/2020 Time: 8206-0156 SLP Time Calculation (min) (ACUTE ONLY): 10 min  Assessment / Plan / Recommendation Clinical Impression  Pt was seen for skilled ST intervention targeting goals for adherence to safe swallow precautions and tolerance of dys2/thin liquid diet. Pt accepted trials of thin liquid, but declined solid textures. No cough following thin liquids, no oral holding observed. RN also reports pt has not been holding food/liquid orally, but has also had minimal po intake. Per RN, pt would like meds whole with liquid rather than crushed. PEG placement is scheduled for Wednesday 04/13/20. ST will sign off at this time. Pt is tolerating those textures she will accept. Please reconsult if needs arise.    HPI HPI: 55 yo female adm to [facility] with concern for asp pna - ? mucous plugging.  Pt with PMH + for GERD, PE=DVT, HLD, HTN, ICH s/p PEG/trach in 2017, IBS, DM2.  ICH 12/2014.  Pt underwent PEG placement 01/2015.  MBS March of 2017 revealed a moderate-severe oral dysphagia with mild pharyngeal impairments, transient penetration of NTL and recommendations for liquid diet with trials of puree.      SLP Plan  Discharge SLP treatment due to goals met       Recommendations  Diet recommendations: Dysphagia 2 (fine chop);Dysphagia 1 (puree);Thin liquid Liquids provided via: Straw Medication Administration: Whole meds with liquid Supervision: Staff to assist with self feeding;Full supervision/cueing for compensatory strategies Compensations: Slow rate;Small sips/bites;Follow solids with liquid;Other (Comment) Postural Changes and/or Swallow Maneuvers: Seated upright 90 degrees                Oral Care Recommendations: Oral care QID Follow up Recommendations: Skilled Nursing facility SLP Visit Diagnosis: Dysphagia, oral phase (R13.11) Plan: Discharge  SLP treatment due to (comment)       GO               Rachel Vang B. Rachel Vang, [facility], [place] Speech Language Pathologist Office: [phone] Pager: [phone]  Rachel Vang 04/11/2020, 12:49 PM

## 2020-04-11 NOTE — Progress Notes (Signed)
Progress Note  Patient Name: Rachel Vang Date of Encounter: 04/11/2020  [facility] Cardiologist: Chilton Si, MD   Subjective   Denies any chest pain or SOB.   Inpatient Medications    Scheduled Meds: . amoxicillin-clavulanate  875 mg Oral Q12H  . bethanechol  10 mg Oral TID  . Chlorhexidine Gluconate Cloth  6 each Topical Daily  . feeding supplement  237 mL Oral TID BM  . gabapentin  200 mg Oral Q12H  . levETIRAcetam  750 mg Oral BID  . mouth rinse  15 mL Mouth Rinse BID  . metoprolol tartrate  25 mg Oral BID  . mirtazapine  15 mg Oral QHS  . rivaroxaban  20 mg Oral Q supper  . rosuvastatin  5 mg Oral Daily  . sertraline  50 mg Oral Daily  . traZODone  100 mg Oral QHS   Continuous Infusions: . sodium chloride 75 mL/hr at 04/11/20 0757   PRN Meds: acetaminophen **OR** acetaminophen, morphine injection, prochlorperazine   Vital Signs    Vitals:   04/10/20 0311 04/10/20 0609 04/10/20 1543 04/11/20 0411  BP: 101/74 (!) 123/91 (!) 131/97 (!) 139/94  Pulse: (!) 112 99 94 97  Resp: 18 18 20 20   Temp: 98.8 F (37.1 C) 97.8 F (36.6 C) 98.1 F (36.7 C) 98.4 F (36.9 C)  TempSrc:   Oral Oral  SpO2: 97% 97% 99% 97%  Weight:      Height:        Intake/Output Summary (Last 24 hours) at 04/11/2020 1118 Last data filed at 04/11/2020 0400 Gross per 24 hour  Intake 200 ml  Output 175 ml  Net 25 ml   Last 3 Weights 04/04/2020 04/04/2019 11/19/2017  Weight (lbs) 111 lb 15.9 oz 112 lb 117 lb  Weight (kg) 50.8 kg 50.803 kg 53.071 kg      Telemetry    NSR to Stach - Personally Reviewed  ECG    n/a - Personally Reviewed  Physical Exam   VS:  BP (!) 139/94 (BP Location: Left Arm)   Pulse 97   Temp 98.4 F (36.9 C) (Oral)   Resp 20   Ht 5\' 6"  (1.676 m)   Wt 50.8 kg   LMP  (LMP Unknown)   SpO2 97%   BMI 18.08 kg/m  , BMI Body mass index is 18.08 kg/m. GEN: Well nourished, well developed in no acute distress HEENT: Normal NECK: No JVD; No  carotid bruits LYMPHATICS: No lymphadenopathy CARDIAC:RRR, no murmurs, rubs, gallops RESPIRATORY:  Clear to auscultation without rales, wheezing or rhonchi  ABDOMEN: Soft, non-tender, non-distended MUSCULOSKELETAL:  No edema; No deformity  SKIN: Warm and dry NEURO:  L sided contracture and hemiparesis.  R facial droop PSYCHIATRIC:  Normal affect    Labs    High Sensitivity Troponin:  No results for input(s): TROPONINIHS in the last 720 hours.    Chemistry Recent Labs  Lab 04/05/20 1012 04/06/20 0256 04/07/20 0430 04/08/20 0532 04/09/20 0522 04/10/20 0535 04/11/20 0456  NA 150* 144   < > 140 143 143 144  K 3.6 2.7*   < > 3.9 3.3* 3.0* 3.9  CL 114* 105   < > 107 110 109 110  CO2 27 27   < > 23 24 26 25   GLUCOSE 90 82   < > 106* 114* 121* 118*  BUN 18 10   < > <5* <5* <5* <5*  CREATININE 0.72 0.54   < > 0.53 0.51 0.58 0.55  CALCIUM 8.5* 8.5*   < > 8.7* 8.4* 8.7* 9.1  PROT 6.0* 6.5  --  6.6  --   --   --   ALBUMIN 2.8* 2.9*  --  2.7*  --   --   --   AST 18 19  --  16  --   --   --   ALT 8 11  --  13  --   --   --   ALKPHOS 89 106  --  107  --   --   --   BILITOT 0.9 0.8  --  0.8  --   --   --   GFRNONAA >60 >60   < > >60 >60 >60 >60  ANIONGAP 9 12   < > 10 9 8 9    < > = values in this interval not displayed.     Hematology Recent Labs  Lab 04/09/20 0522 04/10/20 0535 04/11/20 0456  WBC 7.7 7.4 6.7  RBC 2.91* 2.95* 3.03*  HGB 8.9* 9.2* 9.2*  HCT 29.0* 29.2* 30.3*  MCV 99.7 99.0 100.0  MCH 30.6 31.2 30.4  MCHC 30.7 31.5 30.4  RDW 15.8* 15.7* 16.2*  PLT 171 250 279    BNPNo results for input(s): BNP, PROBNP in the last 168 hours.   DDimer  Recent Labs  Lab 04/08/20 1120  DDIMER 0.64*     Radiology    No results found.  Cardiac Studies   Echo 04/08/20: 1. Moderate pericardial effusion. The pericardial effusion is anterior to  the right ventricle. Mild RV diastolic compression without RV diastolic  collapse. Unable to assess IVC or hepatic veins.  Respirometer not  performed. Study is suboptimal for  assessment of constricitive physiology. Tachycardia noted. Consider repeat  echo in 2-3 days or with clinical deterioration.  2. Left ventricular ejection fraction, by estimation, is 60 to 65%. The  left ventricle has normal function. Left ventricular endocardial border  not optimally defined to evaluate regional wall motion. Indeterminate  diastolic filling due to E-A fusion.  3. Right ventricular systolic function is normal. The right ventricular  size is normal. Tricuspid regurgitation signal is inadequate for assessing  PA pressure.  4. The mitral valve is normal in structure. No evidence of mitral valve  regurgitation. No evidence of mitral stenosis.  5. The aortic valve was not well visualized. Aortic valve regurgitation  is not visualized. No aortic stenosis is present.   Patient Profile     55 y.o. female with a hx of hypertension, hyperlipidemia, subarachnoid hemorrhage, left hemiparesis, diabetes, epilepsy, and DVT who is being seen today for the evaluation of tachycardia and pericardial effusion.  Assessment & Plan   # Sinus tachycardia: -Initially her sinus tachycardia was appropriate in the setting of intravascular volume depletion (serum sodium 158) and sepsis.  However both have been resolved.  ' -IVC is not well-visualized on her echo, but it seems to be at the upper limit of normal in size.   -Agree, that pain from contractures likely does contribute somewhat, though she appears generally comfortable.   -She complains of some pain in her legs that is chronic.   -She also has chronic anemia.  Her hemoglobin was initially 14, though this was likely hemoconcentrated.  Recently it has been stable in the 10s.  It dropped to 8.9 and has now stabilized at 9.2.  No obvious sign of bleeding.   -The underlying cause of her tachycardia is unclear.  At this point, I would call  inappropriate sinus tachycardia.   -She has  normal systolic function on echo and therefore there is no urgent need to treat it.   -She does have a pericardial effusion that does not seem to be consistent with tamponade.  -Would repeat the echo in a couple weeks to make sure it is not progressing.   -Metoprolol 25 mg twice daily was started and her HR is slowly down trending but still in the low 100;s -increase metoprolol to 37.5mg  BID  # Prior DVT/PE:  -Negative for PE/DVT this admission.   -Continue Xarelto.  #HTN -BP controlled on BB -continue Lopressor   #Pericardial effusion -moderate on echo 04/08/2020 -no evidence of tamponade -hemodynamically stable -repeat echo in 2 weeks to make sure it has improved     I have spent a total of 30 minutes with patient reviewing 2D echo , telemetry, EKGs, labs and examining patient as well as establishing an assessment and plan that was discussed with the patient.  > 50% of time was spent in direct patient care.    For questions or updates, please contact [facility] Please consult [email or web address] for contact info under        Signed, Armanda Magic, MD  04/11/2020, 11:18 AM

## 2020-04-12 ENCOUNTER — Inpatient Hospital Stay (HOSPITAL_COMMUNITY): Payer: Medicaid Other

## 2020-04-12 ENCOUNTER — Encounter (HOSPITAL_COMMUNITY)
Admission: EM | Disposition: A | Discharge status: Skilled Nursing Facility | Payer: Self-pay | Source: Skilled Nursing Facility | Attending: Internal Medicine

## 2020-04-12 DIAGNOSIS — I313 Pericardial effusion (noninflammatory): Secondary | ICD-10-CM | POA: Diagnosis not present

## 2020-04-12 LAB — ECHOCARDIOGRAM LIMITED
Calc EF: 49 %
Height: 66 in
S' Lateral: 3.39 cm
Single Plane A2C EF: 46.8 %
Single Plane A4C EF: 51 %
Weight: 1791.9 oz

## 2020-04-12 LAB — CBC
HCT: 27.6 % — ABNORMAL LOW (ref 36.0–46.0)
Hemoglobin: 8.4 g/dL — ABNORMAL LOW (ref 12.0–15.0)
MCH: 31.3 pg (ref 26.0–34.0)
MCHC: 30.4 g/dL (ref 30.0–36.0)
MCV: 103 fL — ABNORMAL HIGH (ref 80.0–100.0)
Platelets: 353 10*3/uL (ref 150–400)
RBC: 2.68 MIL/uL — ABNORMAL LOW (ref 3.87–5.11)
RDW: 16.3 % — ABNORMAL HIGH (ref 11.5–15.5)
WBC: 8.7 10*3/uL (ref 4.0–10.5)
nRBC: 0 % (ref 0.0–0.2)

## 2020-04-12 LAB — GLUCOSE, CAPILLARY
Glucose-Capillary: 106 mg/dL — ABNORMAL HIGH (ref 70–99)
Glucose-Capillary: 110 mg/dL — ABNORMAL HIGH (ref 70–99)
Glucose-Capillary: 87 mg/dL (ref 70–99)
Glucose-Capillary: 99 mg/dL (ref 70–99)

## 2020-04-12 LAB — BASIC METABOLIC PANEL
Anion gap: 9 (ref 5–15)
BUN: <5 mg/dL — ABNORMAL LOW (ref 6–20)
CO2: 24 mmol/L (ref 22–32)
Calcium: 8.7 mg/dL — ABNORMAL LOW (ref 8.9–10.3)
Chloride: 111 mmol/L (ref 98–111)
Creatinine, Ser: 0.49 mg/dL (ref 0.44–1.00)
GFR, Estimated: >60 mL/min (ref >60)
Glucose, Bld: 108 mg/dL — ABNORMAL HIGH (ref 70–99)
Potassium: 3.5 mmol/L (ref 3.5–5.1)
Sodium: 144 mmol/L (ref 135–145)

## 2020-04-12 LAB — HEPARIN LEVEL (UNFRACTIONATED)
Heparin Unfractionated: 0.23 IU/mL — ABNORMAL LOW (ref 0.30–0.70)
Heparin Unfractionated: 0.36 IU/mL (ref 0.30–0.70)
Heparin Unfractionated: 0.53 IU/mL (ref 0.30–0.70)

## 2020-04-12 LAB — APTT: aPTT: 57 seconds — ABNORMAL HIGH (ref 24–36)

## 2020-04-12 LAB — MAGNESIUM: Magnesium: 2 mg/dL (ref 1.7–2.4)

## 2020-04-12 SURGERY — ECHOCARDIOGRAM, TRANSESOPHAGEAL
Anesthesia: Monitor Anesthesia Care

## 2020-04-12 MED ORDER — IOHEXOL 9 MG/ML PO SOLN
75.0000 mL | Freq: Once | ORAL | Status: AC
  Administered 2020-04-12: 75 mL via ORAL
  Filled 2020-04-12: qty 500

## 2020-04-12 MED ORDER — POTASSIUM CHLORIDE 20 MEQ PO PACK
40.0000 meq | PACK | Freq: Two times a day (BID) | ORAL | Status: AC
  Administered 2020-04-12 (×2): 40 meq via ORAL
  Filled 2020-04-12 (×2): qty 2

## 2020-04-12 NOTE — Progress Notes (Signed)
Nutrition Follow-up  DOCUMENTATION CODES:   Non-severe (moderate) malnutrition in context of chronic illness,Underweight  INTERVENTION:  - continue Ensure Enlive TID.  - TF recommendations: Osmolite 1.5 @ 25 ml/hr x18 hours/day (1700-1100) to advance by 10 ml every 12 hours to reach goal rate of 55 ml/hr with 100 ml free water TID. - at goal rate, this regimen will provide 1485 kcal (89% kcal need), 62 grams protein (73% protein need), and 1054 ml free water.  - weigh patient today.   Monitor magnesium, potassium, and phosphorus daily for at least 3 days, MD to replete as needed, as pt is at risk for refeeding syndrome given moderate malnutrition, inadequate nutrition since PTA.    NUTRITION DIAGNOSIS:   Moderate Malnutrition related to chronic illness,dysphagia as evidenced by mild fat depletion,moderate muscle depletion. -ongoing  GOAL:   Patient will meet greater than or equal to 90% of their needs -unmet  MONITOR:   PO intake,Supplement acceptance,TF tolerance,Labs,Weight trends  ASSESSMENT:   55 y.o. female with medical history of pulmonary embolism, DVT, type 2 DM, epilepsy, GERD, HLD, HTN, non-traumatic subarachnoid hemorrhage, IBS, and dysphagia. She was sent to the ED from facility due to not eating or drinking much x1 week and concern for aspiration.  Diet advanced from NPO to FLD on 3/8 at 1455 and to Dysphagia 2, thin liquids on 3/10 at 1355.  Recent intakes were 25% of breakfast on 3/13; 0% of lunch and 0% of dinner on 3/14; 0% of breakfast and 0% of lunch today. She has been accepting Ensure Enlive 90% of the time offered.   Lunch tray on bed side table, untouched. Patient laying in bed with no family or visitors present.   She states that she is not feeling hungry today. She denies abdominal pain/pressure or nausea but is overall feeling "so so" today.   Patient is aware of plan for PEG placement tomorrow in IR. She is feeling frustrated as she had hoped to  avoid having PEG replaced. Talked with patient about still being able to eat and drink after tube placement but that this will take the pressure off of her to meet nutrition needs orally.   Patient has not been weighed since admission on 3/7. Non-pitting edema to BLE documented in the edema section of flow sheet.      Labs reviewed; CBGs: 106, 110, 99 mg/dl, BUN: <5 mg/dl, Ca: 8.7 mg/dl. Medications reviewed; 40 mEq Klor-Con x2 doses 3/15. IVF; NS @ 75 ml/hr.    Diet Order:   Diet Order            Diet NPO time specified Except for: Sips with Meds  Diet effective midnight           DIET DYS 2 Room service appropriate? Yes; Fluid consistency: Thin  Diet effective now                 EDUCATION NEEDS:   No education needs have been identified at this time  Skin:  Skin Assessment: Reviewed RN Assessment  Last BM:  3/14  Height:   Ht Readings from Last 1 Encounters:  04/04/20 5\' 6"  (1.676 m)    Weight:   Wt Readings from Last 1 Encounters:  04/04/20 50.8 kg    Estimated Nutritional Needs:  Kcal:  06/04/20 kcal Protein:  85-100 grams Fluid:  >/= 2.3 L/day      [id number], MS, RD, LDN, CNSC Inpatient Clinical Dietitian RD pager # available in AMION  After hours/weekend pager #  available in [facility]

## 2020-04-12 NOTE — Progress Notes (Signed)
ANTICOAGULATION CONSULT NOTE  Pharmacy Consult for heparin Indication: hx pulmonary embolus and DVT  No Known Allergies  Patient Measurements: Height: 5\' 6"  (167.6 cm) Weight: 50.8 kg (111 lb 15.9 oz) IBW/kg (Calculated) : 59.3 Heparin Dosing Weight: 51 kg  Vital Signs: Temp: 100 F (37.8 C) (03/14 2130) Temp Source: Oral (03/14 2130) BP: 131/94 (03/14 2130) Pulse Rate: 110 (03/14 2130)  Labs: Recent Labs    04/10/20 0535 04/11/20 0456 04/11/20 1756 04/12/20 0111  HGB 9.2* 9.2*  --  8.4*  HCT 29.2* 30.3*  --  27.6*  PLT 250 279  --  353  APTT  --   --  34 57*  HEPARINUNFRC  --   --  0.14* 0.23*  CREATININE 0.58 0.55  --  0.49    Estimated Creatinine Clearance: 63 mL/min (by C-G formula based on SCr of 0.49 mg/dL).   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 Xarelto resumed on admission.  Plan is for PEG placement on 3/16 at 10a and IR recommends to hold xarelto at least 24 hrs prior to procedure. Pharmacy has been consulted to bridge with heparin drip while xarelto is on hold.  - last dose of xarelto taken at [facility] on 3/13.  Baseline heparin level & aptt correlate therefore will use heparin level for dosing.   04/12/2020:  Initial heparin level 0.23- subtherapeutic on IV heparin 800 units/hr  aptt 57 sec- corresponds with above heparin lvel, sub-therapeutic  CBC: Hg 8.4- low, pltc WNL  No bleeding or infusion related concerns per RN  Goal of Therapy:  Heparin level 0.3-0.7 units/ml aPTT 66-102 seconds Monitor platelets by anticoagulation protocol: Yes   Plan:  - Increase heparin drip to 950 units/hr  - check 6 hr heparin level after heparin increased - Daily heparin level & CBC while on heparin - Per 4/13 from IR-->hold heparin drip at 0600 on 3/16 prior for PEG placement  at 1000   Alwyn Ren PharmD, BCPS 04/12/2020,3:13 AM

## 2020-04-12 NOTE — Progress Notes (Signed)
  Echocardiogram 2D Echocardiogram has been performed.  Rachel Vang 04/12/2020, 12:03 PM

## 2020-04-12 NOTE — Progress Notes (Signed)
ANTICOAGULATION CONSULT NOTE  Pharmacy Consult for IV heparin Indication: hx pulmonary embolus and DVT  No Known Allergies  Patient Measurements: Height: 5\' 6"  (167.6 cm) Weight: 50 kg (110 lb 3.2 oz) IBW/kg (Calculated) : 59.3 Heparin Dosing Weight: actual body weight   Vital Signs: Temp: 98.7 F (37.1 C) (03/15 1400) Temp Source: Oral (03/15 1400) BP: 129/88 (03/15 1400) Pulse Rate: 96 (03/15 1400)  Labs: Recent Labs    04/10/20 0535 04/11/20 0456 04/11/20 1756 04/11/20 1756 04/12/20 0111 04/12/20 0936 04/12/20 1536  HGB 9.2* 9.2*  --   --  8.4*  --   --   HCT 29.2* 30.3*  --   --  27.6*  --   --   PLT 250 279  --   --  353  --   --   APTT  --   --  34  --  57*  --   --   HEPARINUNFRC  --   --  0.14*   < > 0.23* 0.36 0.53  CREATININE 0.58 0.55  --   --  0.49  --   --    < > = values in this interval not displayed.    Estimated Creatinine Clearance: 62 mL/min (by C-G formula based on SCr of 0.49 mg/dL).   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 Xarelto resumed on admission.  Plan is for PEG placement on 3/16 at 10am and IR recommends to hold xarelto at least 24 hrs prior to procedure. Pharmacy has been consulted to bridge with heparin drip while xarelto is on hold.  - last dose of xarelto taken at [facility] on 3/13.  Baseline heparin level & APTT correlate; therefore, will use heparin level for dosing.   04/12/2020:  PM heparin level = 0.53 units/mL, remains therapeutic   CBC: Hgb decreased to 8.4, Pltc WNL  No bleeding or infusion related concerns per RN  Goal of Therapy:  Heparin level 0.3-0.7 units/ml Monitor platelets by anticoagulation protocol: Yes   Plan:   Continue heparin infusion at current rate of 950 units/hr  Per 4/13, NP with IR --> hold heparin infusion at  6am on 3/16 for percutaneous gastrostomy tube placement at 10am - stop order has been placed (nursing aware)  Follow up post-procedure for resumption of anticoagulation    Alwyn Ren, PharmD, BCPS 04/12/2020,4:25 PM

## 2020-04-12 NOTE — Plan of Care (Signed)
  Problem: Education: Goal: Knowledge of General Education information will improve Description: Including pain rating scale, medication(s)/side effects and non-pharmacologic comfort measures Outcome: Progressing   Problem: Clinical Measurements: Goal: Respiratory complications will improve Outcome: Progressing   Problem: Nutrition: Goal: Adequate nutrition will be maintained Outcome: Progressing   Problem: Coping: Goal: Level of anxiety will decrease Outcome: Progressing   Problem: Safety: Goal: Ability to remain free from injury will improve Outcome: Progressing   Problem: Skin Integrity: Goal: Risk for impaired skin integrity will decrease Outcome: Progressing   

## 2020-04-12 NOTE — Progress Notes (Signed)
PROGRESS NOTE   Rachel Vang  TDD:[id number]    DOB: Dec 28, 1965    DOA: 04/04/2020  PCP: Patient, No Pcp Per   I have briefly reviewed patients previous medical records in [facility].  Chief Complaint  Patient presents with  . Failure To Thrive    Brief Narrative:  55 year old female, medical history including but not limited to pulmonary embolism, DVT, type II DM, epilepsy, GERD, HLD, HTN, nontraumatic subarachnoid hemorrhage, left hemiparesis, IBS, dysphagia,?  Nursing home resident, nonambulatory/wheelchair mobile, presented to the ED due to not eating or drinking for past 1 week PTA.  Admitted for sepsis due to aspiration pneumonia from pre-existing dysphagia, dehydration with hypernatremia.  Clinically improved.  Transferred to medical bed 3/10.  Ongoing poor oral intake/pocketing of food.  Also persistent sinus tachycardia, unclear etiology, lower extremity Dopplers and CTA negative for DVT and PE respectively, 2D echo shows moderate pericardial effusion without tamponade features, TSH normal.  Cardiology consulted, titrating beta-blockers.  Ongoing dysphagia issues, inconsistent oral intake, difficulty swallowing some of the pills.  Patient finally agreeable to PEG tube placement, IR consulted and plan for procedure on 3/16.   Assessment & Plan:  Principal Problem:   Sepsis due to pneumonia [facility]) Active Problems:   Seizures ([place])   DVT (deep venous thrombosis) (HCC)   Type II diabetes mellitus with neurological manifestations ([place])   Essential hypertension, benign   Chronic pulmonary embolism (HCC)   Dyslipidemia associated with type 2 diabetes mellitus (HCC)   Hypernatremia   Inappropriate sinus tachycardia   Hemiplegia as late effect of cerebrovascular accident (CVA) ([place])   Hypokalemia   Prolonged QT interval   Malnutrition of moderate degree   Aspiration pneumonia (HCC)   Severe sepsis, POA, secondary to aspiration pneumonia from pre-existing dysphagia:  Met  sepsis criteria on admission including fever of 100.3 F, tachypnea in the 20s-occasionally low 30s, tachycardic and hypoxic at 89% on 10 L/min oxygen/NRB.  Neutrophilic leukocytosis/WBC 14.3 on admission.  Lactate peaked to 6.7.  Flu panel and COVID-19 PCR negative.  Urine streptococcal antigen negative.  Blood cultures remain negative today.  MRSA PCR negative.  Urine microscopy not suggestive of UTI and culture showed multiple species.  HIV screen nonreactive.  S/p IV azithromycin x1 dose and ceftriaxone x1 dose on 3/7.  Then treated with IV Unasyn 3/7 >.  Completed approximately 3 days of IV antibiotics.  Although intermittent low-grade fevers, she looks good, not septic or toxic looking, tachycardia switched to Augmentin 3/10 x5 days.  Close to completing course.  Recommend follow-up of chest x-ray in 4 weeks.  CTA chest 3/11 shows dependent airspace disease on the right side suggestive of aspiration.  Improved.  Discussed with patient and son at bedside 3/11 that patient remains at high risk for recurrent aspiration due to underlying dysphagia issues and also cognitive impairment.  Inappropriate sinus tachycardia/pericardial effusion/new cardiomyopathy  Asymptomatic.  Unclear etiology.  Likely multifactorial-pain at times, anemia.  Sepsis resolved.  Clinically euvolemic.  Given prior history of DVT/PE on Xarelto, bedbound status, lower extremity contractures, at risk for VTE's, checked D-dimer which was positive, bilateral lower extremity venous Dopplers negative for DVT and CTA chest negative for PE.  2D echo showed normal EF but moderate pericardial effusion without tamponade features.  Follow-up limited echo 3/15 shows LVEF reduced at 40-45%, LV global hypokinesis, small circumferential pericardial effusion without cardiac tamponade.  At risk for rate related cardiomyopathy.  Cardiology uptitrating metoprolol, increased to 37.5 mg twice daily.  Initially felt  that the tachycardia  was improving but in the last 24 hours, seems to be back up in the 100s/110s with occasional PVCs and bigeminy.  Dysphagia  Chronic.?  Related to prior subarachnoid hemorrhage.  Not sure if she had a CVA.  Speech therapy follow-up appreciated and have advance diet to dysphagia 2 diet and thin liquids with which hopefully her oral intake will improve.  Dr. Domingo Cocking, palliative care MD and I had separate and detailed conversation with patient regarding PEG tube which she had repeatedly declined.  She reportedly had a PEG tube in the past which had to be removed due to leakage around the tube.  Speech therapy follow-up appreciated and recommend ongoing dysphagia 2 (fine chopped), dysphagia 1 (pured) and thin liquids.  Patient noted to be pocketing food.  Highly inconsistent oral intake along with difficulty taking some of her oral meds.  Those that could be switched have been changed to liquid preparation.  Per RN report, all day yesterday did not eat or drink much at all which was associated with decreased urine output.  Started IVF today pending PEG tube placement on 3/16.  Patient and family now agreeable to a PEG tube placement.    IR consulted for percutaneous gastrostomy tube placement, plan for 3/16 at 10 AM, Xarelto has been held and on IV heparin infusion which is to be stopped at 6 AM tomorrow.  Resume Xarelto post procedure when cleared by IR.  Dehydration with hypernatremia  Secondary to poor oral intake.  Presented with serum sodium of 158.  Treated with hypotonic/half-normal saline.  Borderline hydration.  Ongoing very poor oral intake.  Started IV fluids again  Will be at risk of recurrent dehydration if has inconsistent oral intake.  Hypokalemia  Replaced.  Periodically follow BMP.  Magnesium 1.8.  Lactic acidosis:  Secondary to sepsis and acute respiratory failure.  Lactate peaked to 6.7.  Treated per sepsis protocol with aggressive IV fluids and improved.  Acute  respiratory failure with hypoxia  Secondary to aspiration pneumonia  Resolved.  Seizure disorder  Continue prior home dose of Keppra.  Essential hypertension  Mildly uncontrolled at times  Dyslipidemia  Continue home rosuvastatin.  Type II DM  Well-controlled.  History of DVT/PE  Briefly on full dose Lovenox which has been switched back to her home dose of Xarelto.     Xarelto held and will be started on IV heparin bridging pending gastrostomy tube placement 3/16.  Prolonged QTC  Improved, EKG 3/10: QTC 456 ms  Continue to replace low potassium.  Avoid QT prolonging medications.  Adult failure to thrive  Likely major component being poor oral intake related to dysphagia.  Palliative care MD input appreciated: Full code/full scope, family to complete MOST form when able, palliative care to continue to follow at SNF.  Communicated with palliative MD regarding patient's consent for PEG tube.  Anemia of chronic disease  Hemoglobin has dropped from 9.2-8.4 in the absence of overt bleeding.  Could be dilutional from IV fluids started yesterday.  Is on IV heparin.  Follow CBC in a.m.  Transfuse if hemoglobin 7 g or less.  Thrombocytopenia  Possibly related to sepsis.  Resolved.  Diarrhea  Resolved.  Body mass index is 17.79 kg/m.  Nutritional Status Nutrition Problem: Moderate Malnutrition Etiology: chronic illness,dysphagia Signs/Symptoms: mild fat depletion,moderate muscle depletion Interventions: Ensure Enlive (each supplement provides 350kcal and 20 grams of protein),Magic cup,Tube feeding    DVT prophylaxis:   Full dose Lovenox   Code Status: Full  Code Family Communication: I discussed with patient's son in detail on 3/13 via phone, updated care and answered questions.  He indicated that he discussed in detail with his mother last night and may have convinced her to change her mind.  Both are now agreeable for PEG tube placement.  None at bedside  today. Disposition:  Status is: Inpatient  Remains inpatient appropriate because:Inpatient level of care appropriate due to severity of illness   Dispo: The patient is from: LTC SNF at [facility]              Anticipated d/c is to: Return to LTC SNF at [facility] pending percutaneous gastrostomy tube placement and when tolerating tube feeds.  Possibly towards the end of this week.              Patient currently is not medically stable to d/c.   Difficult to place patient No        Consultants:   Palliative care medicine Cardiology IR-pending  Procedures:   None  Antimicrobials:    Anti-infectives (From admission, onward)   Start     Dose/Rate Route Frequency Ordered Stop   04/08/20 1030  amoxicillin-clavulanate (AUGMENTIN) 400-57 MG/5ML suspension 875 mg        875 mg Oral Every 12 hours 04/08/20 0935 04/13/20 0959   04/07/20 2200  amoxicillin-clavulanate (AUGMENTIN) 875-125 MG per tablet 1 tablet  Status:  Discontinued        1 tablet Oral Every 12 hours 04/07/20 1417 04/08/20 0935   04/05/20 0400  Ampicillin-Sulbactam (UNASYN) 3 g in sodium chloride 0.9 % 100 mL IVPB  Status:  Discontinued        3 g 200 mL/hr over 30 Minutes Intravenous Every 8 hours 04/04/20 2047 04/07/20 1417   04/04/20 1700  cefTRIAXone (ROCEPHIN) 2 g in sodium chloride 0.9 % 100 mL IVPB  Status:  Discontinued        2 g 200 mL/hr over 30 Minutes Intravenous Every 24 hours 04/04/20 1657 04/04/20 2026   04/04/20 1700  azithromycin (ZITHROMAX) 500 mg in sodium chloride 0.9 % 250 mL IVPB  Status:  Discontinued        500 mg 250 mL/hr over 60 Minutes Intravenous Every 24 hours 04/04/20 1657 04/04/20 2026        Subjective:  Seen early this morning.  Was still sleeping.  Woke up easily.  Smiling.  Denies complaints.  Indicated that she had no further questions.  As per RN, underwent in and out catheterization last night for urinary retention.  Objective:   Vitals:   04/11/20 2130 04/12/20  0559 04/12/20 1400 04/12/20 1600  BP: (!) 131/94 132/79 129/88   Pulse: (!) 110 (!) 104 96   Resp:  16 (!) 22   Temp: 100 F (37.8 C) 97.9 F (36.6 C) 98.7 F (37.1 C)   TempSrc: Oral Oral Oral   SpO2: 99% 98% 100%   Weight:    50 kg  Height:        General exam: Young female, small built and frail, lying comfortably propped up in bed without distress.  Oral mucosa moist. ENT: As examined on 3/11: Pocketing food.  After she was made to spit out all of it there was still some left in the mouth.  She just has 4-5 front teeth on the top and below, difficult to clearly examine because of food residue but appears that her teeth require cleaning but did not really notice any acute findings  or caries.  No other acute findings noted. Respiratory system: Clear to auscultation.  No increased work of breathing. Cardiovascular system: S1 and S2 heard, RRR.  No JVD or murmurs.  Telemetry personally reviewed: Sinus tachycardia in the 100-110's with occasional PVCs and bigeminy night. Gastrointestinal system: Abdomen is nondistended, soft and nontender.  No organomegaly or masses appreciated.  Normal bowel sounds heard. Central nervous system: Mental status as noted above.  Does not say much.  Follows simple instructions.  No focal deficits. Extremities: Grade 5 x 5 power in right upper extremity, 4+ by 5 left hand grip.  Bilateral lower extremity with significant contractures. Skin: No rashes, lesions or ulcers Psychiatry: Judgement and insight appear impaired. Mood & affect pleasant and appropriate today.    Data Reviewed:   I have personally reviewed following labs and imaging studies   CBC: Recent Labs  Lab 04/10/20 0535 04/11/20 0456 04/12/20 0111  WBC 7.4 6.7 8.7  HGB 9.2* 9.2* 8.4*  HCT 29.2* 30.3* 27.6*  MCV 99.0 100.0 103.0*  PLT 250 279 096    Basic Metabolic Panel: Recent Labs  Lab 04/07/20 0430 04/07/20 1133 04/10/20 0535 04/11/20 0456 04/12/20 0111  NA 137   < > 143  144 144  K 2.9*   < > 3.0* 3.9 3.5  CL 101   < > 109 110 111  CO2 26   < > 26 25 24   GLUCOSE 101*   < > 121* 118* 108*  BUN 6   < > <5* <5* <5*  CREATININE 0.53   < > 0.58 0.55 0.49  CALCIUM 8.0*   < > 8.7* 9.1 8.7*  MG 2.1  --  1.8  --  2.0   < > = values in this interval not displayed.    Liver Function Tests: Recent Labs  Lab 04/06/20 0256 04/08/20 0532  AST 19 16  ALT 11 13  ALKPHOS 106 107  BILITOT 0.8 0.8  PROT 6.5 6.6  ALBUMIN 2.9* 2.7*    CBG: Recent Labs  Lab 04/12/20 0556 04/12/20 1202 04/12/20 1633  GLUCAP 110* 99 87    Microbiology Studies:   Recent Results (from the past 240 hour(s))  Blood culture (routine x 2)     Status: None   Collection Time: 04/04/20  5:25 PM   Specimen: BLOOD RIGHT FOREARM  Result Value Ref Range Status   Specimen Description   Final    BLOOD RIGHT FOREARM Performed at [place] [address] [place], [place] [address]    Special Requests   Final    BACTERIAL CASTS Blood Culture adequate volume Performed at [place] [address] [place], [place] [address]    Culture   Final    NO GROWTH 5 DAYS Performed at [facility], [place] [address] [place], [place] [address]    Report Status 04/09/2020 FINAL  Final  Blood culture (routine x 2)     Status: None   Collection Time: 04/04/20  5:25 PM   Specimen: BLOOD LEFT FOREARM  Result Value Ref Range Status   Specimen Description   Final    BLOOD LEFT FOREARM Performed at [place] [address], [place], [place] [address]    Special Requests   Final    BLOOD Blood Culture results may not be optimal due to an inadequate volume of blood received in culture bottles Performed at [place] [address] [place], [place]  [address]    Culture   Final    NO GROWTH 5 DAYS Performed at [facility], [place] [address] [place], [place] [address]    Report Status 04/09/2020  FINAL  Final  Resp Panel by RT-PCR (Flu A&B, Covid) Nasopharyngeal Swab     Status: None   Collection Time: 04/04/20  5:25 PM   Specimen: Nasopharyngeal Swab; Nasopharyngeal(NP) swabs in vial transport medium  Result Value Ref Range Status   SARS Coronavirus 2 by RT PCR NEGATIVE NEGATIVE Final    Comment: (NOTE) SARS-CoV-2 target nucleic acids are NOT DETECTED.  The SARS-CoV-2 RNA is generally detectable in upper respiratory specimens during the acute phase of infection. The lowest concentration of SARS-CoV-2 viral copies this assay can detect is 138 copies/mL. A negative result does not preclude SARS-Cov-2 infection and should not be used as the sole basis for treatment or other patient management decisions. A negative result may occur with  improper specimen collection/handling, submission of specimen other than nasopharyngeal swab, presence of viral mutation(s) within the areas targeted by this assay, and inadequate number of viral copies(<138 copies/mL). A negative result must be combined with clinical observations, patient history, and epidemiological information. The expected result is Negative.  Fact Sheet for Patients:  EntrepreneurPulse.com.au  Fact Sheet for Healthcare Providers:  IncredibleEmployment.be  This test is no t yet approved or cleared by the [place] FDA and  has been authorized for detection and/or diagnosis of SARS-CoV-2 by FDA under an Emergency Use Authorization (EUA). This EUA will remain  in effect (meaning this test can be used) for the duration of the COVID-19 declaration under Section 564(b)(1) of the Act, 21 U.S.C.section 360bbb-3(b)(1), unless the authorization is terminated  or revoked sooner.       Influenza A by PCR NEGATIVE NEGATIVE Final   Influenza B by PCR NEGATIVE NEGATIVE Final    Comment: (NOTE) The Xpert Xpress SARS-CoV-2/FLU/RSV plus assay is intended as an aid in the diagnosis of influenza  from Nasopharyngeal swab specimens and should not be used as a sole basis for treatment. Nasal washings and aspirates are unacceptable for Xpert Xpress SARS-CoV-2/FLU/RSV testing.  Fact Sheet for Patients: EntrepreneurPulse.com.au  Fact Sheet for Healthcare Providers: IncredibleEmployment.be  This test is not yet approved or cleared by the [place] FDA and has been authorized for detection and/or diagnosis of SARS-CoV-2 by FDA under an Emergency Use Authorization (EUA). This EUA will remain in effect (meaning this test can be used) for the duration of the COVID-19 declaration under Section 564(b)(1) of the Act, 21 U.S.C. section 360bbb-3(b)(1), unless the authorization is terminated or revoked.  Performed at [facility], [place] [address], [place], [place] [address]   Urine culture     Status: Abnormal   Collection Time: 04/04/20 10:09 PM   Specimen: In/Out Cath Urine  Result Value Ref Range Status   Specimen Description   Final    IN/OUT CATH URINE Performed at [place] [address], [place], [place] [address]    Special Requests   Final    NONE Performed at [facility], [place] [address], [place], [place] [address]    Culture MULTIPLE SPECIES PRESENT, SUGGEST RECOLLECTION (A)  Final   Report Status 04/06/2020 FINAL  Final  MRSA PCR Screening     Status: None   Collection Time: 04/04/20 11:20 PM   Specimen: Nasopharyngeal  Result Value Ref Range Status   MRSA by PCR NEGATIVE NEGATIVE Final  Comment:        The GeneXpert MRSA Assay (FDA approved for NASAL specimens only), is one component of a comprehensive MRSA colonization surveillance program. It is not intended to diagnose MRSA infection nor to guide or monitor treatment for MRSA infections. Performed at [facility], [place] [address] [place], [place] [address]   SARS CORONAVIRUS 2 (TAT 6-24  HRS) Nasopharyngeal Nasopharyngeal Swab     Status: None   Collection Time: 04/09/20  6:15 AM   Specimen: Nasopharyngeal Swab  Result Value Ref Range Status   SARS Coronavirus 2 NEGATIVE NEGATIVE Final    Comment: (NOTE) SARS-CoV-2 target nucleic acids are NOT DETECTED.  The SARS-CoV-2 RNA is generally detectable in upper and lower respiratory specimens during the acute phase of infection. Negative results do not preclude SARS-CoV-2 infection, do not rule out co-infections with other pathogens, and should not be used as the sole basis for treatment or other patient management decisions. Negative results must be combined with clinical observations, patient history, and epidemiological information. The expected result is Negative.  Fact Sheet for Patients: SugarRoll.be  Fact Sheet for Healthcare Providers: https://www.woods-mathews.com/  This test is not yet approved or cleared by the [place] FDA and  has been authorized for detection and/or diagnosis of SARS-CoV-2 by FDA under an Emergency Use Authorization (EUA). This EUA will remain  in effect (meaning this test can be used) for the duration of the COVID-19 declaration under Se ction 564(b)(1) of the Act, 21 U.S.C. section 360bbb-3(b)(1), unless the authorization is terminated or revoked sooner.  Performed at [facility], [place] [address], [place], [place] [address]      Radiology Studies:  ECHOCARDIOGRAM LIMITED  Result Date: 04/12/2020    ECHOCARDIOGRAM LIMITED REPORT   Patient Name:   AMBAR RAPHAEL Date of Exam: 04/12/2020 Medical Rec #:  [id number]          Height:       66.0 in Accession #:    [id number]         Weight:       112.0 lb Date of Birth:  08/16/1965           BSA:          1.563 m Patient Age:    78 years           BP:           132/79 mmHg Patient Gender: F                  HR:           88 bpm. Exam Location:  Inpatient Procedure: Limited Echo, Cardiac  Doppler and Color Doppler STAT ECHO Indications:    I31.3 Pericardial effusion (noninflammatory)  History:        Patient has prior history of Echocardiogram examinations, most                 recent 04/08/2020. Abnormal ECG, Stroke; Risk Factors:Diabetes.                 Pericardial effusion. Seizure. Pulmonary embolus.  Sonographer:    Roseanna Rainbow RDCS Referring Phys: [place]  Sonographer Comments: Technically difficult study due to poor echo windows. IMPRESSIONS  1. Left ventricular ejection fraction, by estimation, is 40 to 45%. The left ventricle has mildly decreased function. The left ventricle demonstrates global hypokinesis.  2. Right ventricular systolic function is normal. The right ventricular size is normal.  3. A small pericardial effusion is present. The pericardial effusion is circumferential. There is no evidence of cardiac tamponade.  4. The mitral valve is normal in structure. No evidence of mitral valve regurgitation. No evidence of mitral stenosis.  5. The aortic valve is normal in structure. Aortic valve regurgitation is not visualized. No aortic stenosis is present.  6. The inferior vena cava is normal in size with greater than 50% respiratory variability, suggesting right atrial pressure of 3 mmHg. Comparison(s): Prior images reviewed side by side. Changes from prior study are noted. The left ventricular function is worsened. The pericardial effusion is smaller. FINDINGS  Left Ventricle: Left ventricular ejection fraction, by estimation, is 40 to 45%. The left ventricle has mildly decreased function. The left ventricle demonstrates global hypokinesis. The left ventricular internal cavity size was normal in size. There is  no left ventricular hypertrophy. Right Ventricle: The right ventricular size is normal. No increase in right ventricular wall thickness. Right ventricular systolic function is normal. Left Atrium: Left atrial size was normal in size. Right Atrium: Right atrial size  was normal in size. Pericardium: A small pericardial effusion is present. The pericardial effusion is circumferential. There is no evidence of cardiac tamponade. Mitral Valve: The mitral valve is normal in structure. No evidence of mitral valve stenosis. Tricuspid Valve: The tricuspid valve is normal in structure. Tricuspid valve regurgitation is not demonstrated. No evidence of tricuspid stenosis. Aortic Valve: The aortic valve is normal in structure. Aortic valve regurgitation is not visualized. No aortic stenosis is present. Pulmonic Valve: The pulmonic valve was normal in structure. Pulmonic valve regurgitation is not visualized. No evidence of pulmonic stenosis. Aorta: The aortic root is normal in size and structure. Venous: The inferior vena cava is normal in size with greater than 50% respiratory variability, suggesting right atrial pressure of 3 mmHg. IAS/Shunts: No atrial level shunt detected by color flow Doppler. LEFT VENTRICLE PLAX 2D LVIDd:         4.16 cm LVIDs:         3.39 cm LV PW:         1.48 cm LV IVS:        1.03 cm  LV Volumes (MOD) LV vol d, MOD A2C: 43.4 ml LV vol d, MOD A4C: 72.0 ml LV vol s, MOD A2C: 23.1 ml LV vol s, MOD A4C: 35.3 ml LV SV MOD A2C:     20.3 ml LV SV MOD A4C:     72.0 ml LV SV MOD BP:      27.8 ml IVC IVC diam: 0.80 cm LEFT ATRIUM         Index LA diam:    3.40 cm 2.18 cm/m   AORTA Ao Root diam: 3.10 cm Ao Asc diam:  3.00 cm Mihai Croitoru MD Electronically signed by Sanda Klein MD Signature Date/Time: 04/12/2020/12:14:41 PM    Final      Scheduled Meds:   . amoxicillin-clavulanate  875 mg Oral Q12H  . bethanechol  10 mg Oral TID  . Chlorhexidine Gluconate Cloth  6 each Topical Daily  . feeding supplement  237 mL Oral TID BM  . gabapentin  200 mg Oral Q12H  . levETIRAcetam  750 mg Oral BID  . mouth rinse  15 mL Mouth Rinse BID  . metoprolol tartrate  37.5 mg Oral BID  . mirtazapine  15 mg Oral QHS  . potassium chloride  40 mEq Oral BID  . rosuvastatin  5  mg Oral Daily  .  sertraline  50 mg Oral Daily  . traZODone  100 mg Oral QHS    Continuous Infusions:   . sodium chloride 75 mL/hr at 04/12/20 1503  . heparin 950 Units/hr (04/12/20 1503)     LOS: 7 days     Vernell Leep, MD, [place], [facility]. [facility]    To contact the attending provider between 7A-7P or the covering provider during after hours 7P-7A, please log into the web site [email or web address] and access using universal  password for that web site. If you do not have the password, please call the hospital operator.  04/12/2020, 6:11 PM

## 2020-04-12 NOTE — Progress Notes (Signed)
ANTICOAGULATION CONSULT NOTE  Pharmacy Consult for heparin Indication: hx pulmonary embolus and DVT  No Known Allergies  Patient Measurements: Height: 5\' 6"  (167.6 cm) Weight: 50.8 kg (111 lb 15.9 oz) IBW/kg (Calculated) : 59.3 Heparin Dosing Weight: 51 kg  Vital Signs: Temp: 97.9 F (36.6 C) (03/15 0559) Temp Source: Oral (03/15 0559) BP: 132/79 (03/15 0559) Pulse Rate: 104 (03/15 0559)  Labs: Recent Labs    04/10/20 0535 04/11/20 0456 04/11/20 1756 04/12/20 0111 04/12/20 0936  HGB 9.2* 9.2*  --  8.4*  --   HCT 29.2* 30.3*  --  27.6*  --   PLT 250 279  --  353  --   APTT  --   --  34 57*  --   HEPARINUNFRC  --   --  0.14* 0.23* 0.36  CREATININE 0.58 0.55  --  0.49  --     Estimated Creatinine Clearance: 63 mL/min (by C-G formula based on SCr of 0.49 mg/dL).   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 Xarelto resumed on admission.  Plan is for PEG placement on 3/16 at 10a and IR recommends to hold xarelto at least 24 hrs prior to procedure. Pharmacy has been consulted to bridge with heparin drip while xarelto is on hold.  - last dose of xarelto taken at [facility] on 3/13.  Baseline heparin level & aptt correlate therefore will use heparin level for dosing.   04/12/2020:  Heparin level now therapeutic after increasing heparin IV from 800 units/hr to 950 units/hr  CBC: Hg 8.4 - low and dropped, pltc WNL  No bleeding or infusion related concerns per RN  Goal of Therapy:  Heparin level 0.3-0.7 units/ml aPTT 66-102 seconds Monitor platelets by anticoagulation protocol: Yes   Plan:   Continue current IV heparin rate of 950 units/hr  Recheck heparin level in 6 hours as confirmatory level  Daily heparin level & CBC while on heparin  Per 4/13 from IR-->hold heparin drip at 0600 on  3/16 prior for PEG placement at 1000 - stop order has been placed   Alwyn Ren PharmD, BCPS 04/12/2020,11:30 AM

## 2020-04-12 NOTE — Progress Notes (Signed)
Patient scheduled for percutaneous gastrostomy tube placement in IR 04/13/20 around 1000. An order has been placed for the patient to receive 75 ml of oral omnipaque and the bedside RN is aware that the patient needs to drink this tonight around 1800.   Patient has heparin infusing; this needs to be turned off at 0600 04/13/20.   Orders will be placed for the patient to be NPO at midnight; AM labs will also be ordered.   The consent for this procedure is in the IR control room.   Please call IR with any questions.  Alwyn Ren, [place] [phone] 04/12/2020, 2:25 PM

## 2020-04-12 NOTE — Progress Notes (Signed)
Progress Note  Patient Name: Rachel Vang Date of Encounter: 04/12/2020  [facility] Cardiologist: Chilton Si, MD   Subjective   No chest pain or dyspnea.   Inpatient Medications    Scheduled Meds: . amoxicillin-clavulanate  875 mg Oral Q12H  . bethanechol  10 mg Oral TID  . Chlorhexidine Gluconate Cloth  6 each Topical Daily  . feeding supplement  237 mL Oral TID BM  . gabapentin  200 mg Oral Q12H  . levETIRAcetam  750 mg Oral BID  . mouth rinse  15 mL Mouth Rinse BID  . metoprolol tartrate  37.5 mg Oral BID  . mirtazapine  15 mg Oral QHS  . rosuvastatin  5 mg Oral Daily  . sertraline  50 mg Oral Daily  . traZODone  100 mg Oral QHS   Continuous Infusions: . sodium chloride 75 mL/hr at 04/11/20 1941  . heparin 950 Units/hr (04/12/20 0346)   PRN Meds: acetaminophen **OR** acetaminophen, morphine injection, prochlorperazine   Vital Signs    Vitals:   04/11/20 1330 04/11/20 2049 04/11/20 2130 04/12/20 0559  BP: 127/88 (!) 139/94 (!) 131/94 132/79  Pulse: 98 (!) 112 (!) 110 (!) 104  Resp: 18   16  Temp: 98 F (36.7 C)  100 F (37.8 C) 97.9 F (36.6 C)  TempSrc: Oral  Oral Oral  SpO2: 96%  99% 98%  Weight:      Height:        Intake/Output Summary (Last 24 hours) at 04/12/2020 0856 Last data filed at 04/12/2020 0346 Gross per 24 hour  Intake 1944.13 ml  Output 640 ml  Net 1304.13 ml   Last 3 Weights 04/04/2020 04/04/2019 11/19/2017  Weight (lbs) 111 lb 15.9 oz 112 lb 117 lb  Weight (kg) 50.8 kg 50.803 kg 53.071 kg      Telemetry    Sinus tachycardia in 100s, PVCs, and ventricular bigemeny - Personally Reviewed  ECG    N/A  Physical Exam   GEN: Ill appearing thin frail female in no acute distress.   Neck: No JVD Cardiac: RRR, no murmurs, rubs, or gallops.  Respiratory: Clear to auscultation bilaterally. GI: Soft, nontender, non-distended  MS: No edema; No deformity. Neuro:   L sided contracture and hemiparesis.  R facial  droop Psych: Normal affect   Labs    High Sensitivity Troponin:  No results for input(s): TROPONINIHS in the last 720 hours.    Chemistry Recent Labs  Lab 04/05/20 1012 04/06/20 0256 04/07/20 0430 04/08/20 0532 04/09/20 0522 04/10/20 0535 04/11/20 0456 04/12/20 0111  NA 150* 144   < > 140   < > 143 144 144  K 3.6 2.7*   < > 3.9   < > 3.0* 3.9 3.5  CL 114* 105   < > 107   < > 109 110 111  CO2 27 27   < > 23   < > 26 25 24   GLUCOSE 90 82   < > 106*   < > 121* 118* 108*  BUN 18 10   < > <5*   < > <5* <5* <5*  CREATININE 0.72 0.54   < > 0.53   < > 0.58 0.55 0.49  CALCIUM 8.5* 8.5*   < > 8.7*   < > 8.7* 9.1 8.7*  PROT 6.0* 6.5  --  6.6  --   --   --   --   ALBUMIN 2.8* 2.9*  --  2.7*  --   --   --   --  AST 18 19  --  16  --   --   --   --   ALT 8 11  --  13  --   --   --   --   ALKPHOS 89 106  --  107  --   --   --   --   BILITOT 0.9 0.8  --  0.8  --   --   --   --   GFRNONAA >60 >60   < > >60   < > >60 >60 >60  ANIONGAP 9 12   < > 10   < > 8 9 9    < > = values in this interval not displayed.     Hematology Recent Labs  Lab 04/10/20 0535 04/11/20 0456 04/12/20 0111  WBC 7.4 6.7 8.7  RBC 2.95* 3.03* 2.68*  HGB 9.2* 9.2* 8.4*  HCT 29.2* 30.3* 27.6*  MCV 99.0 100.0 103.0*  MCH 31.2 30.4 31.3  MCHC 31.5 30.4 30.4  RDW 15.7* 16.2* 16.3*  PLT 250 279 353    BNPNo results for input(s): BNP, PROBNP in the last 168 hours.   DDimer  Recent Labs  Lab 04/08/20 1120  DDIMER 0.64*     Radiology    No results found.  Cardiac Studies   Echo 04/08/20: 1. Moderate pericardial effusion. The pericardial effusion is anterior to  the right ventricle. Mild RV diastolic compression without RV diastolic  collapse. Unable to assess IVC or hepatic veins. Respirometer not  performed. Study is suboptimal for  assessment of constricitive physiology. Tachycardia noted. Consider repeat  echo in 2-3 days or with clinical deterioration.  2. Left ventricular ejection fraction,  by estimation, is 60 to 65%. The  left ventricle has normal function. Left ventricular endocardial border  not optimally defined to evaluate regional wall motion. Indeterminate  diastolic filling due to E-A fusion.  3. Right ventricular systolic function is normal. The right ventricular  size is normal. Tricuspid regurgitation signal is inadequate for assessing  PA pressure.  4. The mitral valve is normal in structure. No evidence of mitral valve  regurgitation. No evidence of mitral stenosis.  5. The aortic valve was not well visualized. Aortic valve regurgitation  is not visualized. No aortic stenosis is present.   Patient Profile     55 y.o. female with a hx of hypertension, hyperlipidemia, subarachnoid hemorrhage, left hemiparesis, diabetes, epilepsy, and DVTwho is being seen for the evaluation of tachycardia and pericardial effusion.  Assessment & Plan    1. Sinus tachycardia - Unclear etiology in setting of anemia, resolved sepsis and pericardiac effusion.  Also deconditioning. PVCs and Ventricular bigeminy.  - metoprolol titrated to 37.5mg  bid but HR relatively stable at 100s. - Will review further titrate with MD  2. Pericardiac effusion - moderate by echo 04/08/20 - BP stable - No evidence of tamponade - Plan to repeat echo in 2 weeks   3. Hx of DVT/PE - Negative for DVT/PE this admission - Xarelto held for gastrostomy tube placement tomorrow  4. Anemia - Hgb 8.4 today - Per primary team   5. PVCs and Ventricular bigeminy.  - Keep K > 4 and Mg > 2 - K 3.5 today >> will give supplement - Check MG - BB as above   For questions or updates, please contact [facility] Please consult [email or web address] for contact info under        Signed5/11/22, PA  04/12/2020, 8:56 AM

## 2020-04-13 ENCOUNTER — Inpatient Hospital Stay (HOSPITAL_COMMUNITY): Payer: Medicaid Other

## 2020-04-13 ENCOUNTER — Encounter (HOSPITAL_COMMUNITY): Payer: Self-pay | Admitting: Family Medicine

## 2020-04-13 DIAGNOSIS — I3139 Other pericardial effusion (noninflammatory): Secondary | ICD-10-CM

## 2020-04-13 DIAGNOSIS — E1169 Type 2 diabetes mellitus with other specified complication: Secondary | ICD-10-CM

## 2020-04-13 DIAGNOSIS — I420 Dilated cardiomyopathy: Secondary | ICD-10-CM

## 2020-04-13 DIAGNOSIS — R000 Tachycardia, unspecified: Secondary | ICD-10-CM

## 2020-04-13 DIAGNOSIS — E785 Hyperlipidemia, unspecified: Secondary | ICD-10-CM

## 2020-04-13 DIAGNOSIS — I313 Pericardial effusion (noninflammatory): Secondary | ICD-10-CM

## 2020-04-13 HISTORY — PX: IR GASTROSTOMY TUBE MOD SED: IMG625

## 2020-04-13 LAB — GLUCOSE, CAPILLARY
Glucose-Capillary: 130 mg/dL — ABNORMAL HIGH (ref 70–99)
Glucose-Capillary: 139 mg/dL — ABNORMAL HIGH (ref 70–99)
Glucose-Capillary: 89 mg/dL (ref 70–99)
Glucose-Capillary: 93 mg/dL (ref 70–99)
Glucose-Capillary: 97 mg/dL (ref 70–99)

## 2020-04-13 LAB — CBC
HCT: 29.6 % — ABNORMAL LOW (ref 36.0–46.0)
Hemoglobin: 9.2 g/dL — ABNORMAL LOW (ref 12.0–15.0)
MCH: 31.1 pg (ref 26.0–34.0)
MCHC: 31.1 g/dL (ref 30.0–36.0)
MCV: 100 fL (ref 80.0–100.0)
Platelets: 394 10*3/uL (ref 150–400)
RBC: 2.96 MIL/uL — ABNORMAL LOW (ref 3.87–5.11)
RDW: 16.3 % — ABNORMAL HIGH (ref 11.5–15.5)
WBC: 8.6 10*3/uL (ref 4.0–10.5)
nRBC: 0.2 % (ref 0.0–0.2)

## 2020-04-13 LAB — PROTIME-INR
INR: 1.2 (ref 0.8–1.2)
Prothrombin Time: 14.8 seconds (ref 11.4–15.2)

## 2020-04-13 MED ORDER — FENTANYL CITRATE (PF) 100 MCG/2ML IJ SOLN
INTRAMUSCULAR | Status: AC
  Filled 2020-04-13: qty 2

## 2020-04-13 MED ORDER — GLUCAGON HCL (RDNA) 1 MG IJ SOLR
INTRAMUSCULAR | Status: AC | PRN
  Administered 2020-04-13: 1 mg via INTRAVENOUS

## 2020-04-13 MED ORDER — FENTANYL CITRATE (PF) 100 MCG/2ML IJ SOLN
INTRAMUSCULAR | Status: AC | PRN
  Administered 2020-04-13: 25 ug via INTRAVENOUS
  Administered 2020-04-13: 50 ug via INTRAVENOUS
  Administered 2020-04-13: 25 ug via INTRAVENOUS

## 2020-04-13 MED ORDER — GLUCAGON HCL RDNA (DIAGNOSTIC) 1 MG IJ SOLR
INTRAMUSCULAR | Status: AC
  Filled 2020-04-13: qty 1

## 2020-04-13 MED ORDER — LIDOCAINE-EPINEPHRINE 1 %-1:100000 IJ SOLN
INTRAMUSCULAR | Status: AC
  Filled 2020-04-13: qty 1

## 2020-04-13 MED ORDER — LIDOCAINE HCL 1 % IJ SOLN
INTRAMUSCULAR | Status: AC
  Filled 2020-04-13: qty 20

## 2020-04-13 MED ORDER — MIDAZOLAM HCL 2 MG/2ML IJ SOLN
INTRAMUSCULAR | Status: AC | PRN
  Administered 2020-04-13: 0.5 mg via INTRAVENOUS
  Administered 2020-04-13: 1 mg via INTRAVENOUS
  Administered 2020-04-13: 0.5 mg via INTRAVENOUS

## 2020-04-13 MED ORDER — IOHEXOL 300 MG/ML  SOLN
50.0000 mL | Freq: Once | INTRAMUSCULAR | Status: AC | PRN
  Administered 2020-04-13: 25 mL

## 2020-04-13 MED ORDER — LIDOCAINE-EPINEPHRINE 1 %-1:100000 IJ SOLN
INTRAMUSCULAR | Status: AC | PRN
  Administered 2020-04-13: 10 mL

## 2020-04-13 MED ORDER — CEFAZOLIN SODIUM-DEXTROSE 2-4 GM/100ML-% IV SOLN
INTRAVENOUS | Status: AC
  Administered 2020-04-13: 2000 mg
  Filled 2020-04-13: qty 100

## 2020-04-13 MED ORDER — METOPROLOL TARTRATE 25 MG/10 ML ORAL SUSPENSION
50.0000 mg | Freq: Two times a day (BID) | ORAL | Status: DC
  Administered 2020-04-13 – 2020-04-14 (×3): 50 mg via ORAL
  Filled 2020-04-13 (×3): qty 20

## 2020-04-13 MED ORDER — HEPARIN (PORCINE) 25000 UT/250ML-% IV SOLN
950.0000 [IU]/h | INTRAVENOUS | Status: AC
  Administered 2020-04-13: 950 [IU]/h via INTRAVENOUS
  Filled 2020-04-13: qty 250

## 2020-04-13 MED ORDER — MIDAZOLAM HCL 2 MG/2ML IJ SOLN
INTRAMUSCULAR | Status: AC
  Filled 2020-04-13: qty 4

## 2020-04-13 MED ORDER — LIDOCAINE VISCOUS HCL 2 % MT SOLN
OROMUCOSAL | Status: AC
  Filled 2020-04-13: qty 15

## 2020-04-13 NOTE — Progress Notes (Signed)
Progress Note  Patient Name: Rachel MinksStephanie N Vang Date of Encounter: 04/13/2020  [facility] Cardiologist: Chilton Siiffany [place], MD   Subjective   No chest pain, dyspnea or palpitations.   Inpatient Medications    Scheduled Meds: . bethanechol  10 mg Oral TID  . Chlorhexidine Gluconate Cloth  6 each Topical Daily  . feeding supplement  237 mL Oral TID BM  . gabapentin  200 mg Oral Q12H  . levETIRAcetam  750 mg Oral BID  . mouth rinse  15 mL Mouth Rinse BID  . metoprolol tartrate  50 mg Oral BID  . mirtazapine  15 mg Oral QHS  . rosuvastatin  5 mg Oral Daily  . sertraline  50 mg Oral Daily  . traZODone  100 mg Oral QHS   Continuous Infusions: . sodium chloride 75 mL/hr at 04/12/20 2222   PRN Meds: acetaminophen **OR** acetaminophen, morphine injection, prochlorperazine   Vital Signs    Vitals:   04/12/20 1400 04/12/20 1600 04/12/20 2224 04/13/20 0622  BP: 129/88  124/84 114/83  Pulse: 96  (!) 108 (!) 106  Resp: (!) 22  20 20   Temp: 98.7 F (37.1 C)  98.2 F (36.8 C) 97.6 F (36.4 C)  TempSrc: Oral  Axillary Oral  SpO2: 100%  98% 99%  Weight:  50 kg    Height:        Intake/Output Summary (Last 24 hours) at 04/13/2020 0948 Last data filed at 04/12/2020 1615 Gross per 24 hour  Intake 931.2 ml  Output 625 ml  Net 306.2 ml   Last 3 Weights 04/12/2020 04/04/2020 04/04/2019  Weight (lbs) 110 lb 3.2 oz 111 lb 15.9 oz 112 lb  Weight (kg) 49.986 kg 50.8 kg 50.803 kg      Telemetry    Sinus rhythm/tachycardia, PVC- Personally Reviewed  ECG    N/A  Physical Exam   GEN:Ill appearing thin frail female in no acute distress.   Neck:No JVD Cardiac:RRR, no murmurs, rubs, or gallops.  Respiratory:Clear to auscultation bilaterally. NF:AOZHGI:Soft, nontender, non-distended  MS:No edema; No deformity. Neuro:L sided contracture and hemiparesis. R facial droop Psych: Normal affect   Labs    High Sensitivity Troponin:  No results for input(s): TROPONINIHS in the  last 720 hours.    Chemistry Recent Labs  Lab 04/08/20 0532 04/09/20 0522 04/10/20 0535 04/11/20 0456 04/12/20 0111  NA 140   < > 143 144 144  K 3.9   < > 3.0* 3.9 3.5  CL 107   < > 109 110 111  CO2 23   < > 26 25 24   GLUCOSE 106*   < > 121* 118* 108*  BUN <5*   < > <5* <5* <5*  CREATININE 0.53   < > 0.58 0.55 0.49  CALCIUM 8.7*   < > 8.7* 9.1 8.7*  PROT 6.6  --   --   --   --   ALBUMIN 2.7*  --   --   --   --   AST 16  --   --   --   --   ALT 13  --   --   --   --   ALKPHOS 107  --   --   --   --   BILITOT 0.8  --   --   --   --   GFRNONAA >60   < > >60 >60 >60  ANIONGAP 10   < > 8 9 9    < > =  values in this interval not displayed.     Hematology Recent Labs  Lab 04/11/20 0456 04/12/20 0111 04/13/20 0508  WBC 6.7 8.7 8.6  RBC 3.03* 2.68* 2.96*  HGB 9.2* 8.4* 9.2*  HCT 30.3* 27.6* 29.6*  MCV 100.0 103.0* 100.0  MCH 30.4 31.3 31.1  MCHC 30.4 30.4 31.1  RDW 16.2* 16.3* 16.3*  PLT 279 353 394    BNPNo results for input(s): BNP, PROBNP in the last 168 hours.   DDimer  Recent Labs  Lab 04/08/20 1120  DDIMER 0.64*     Radiology    ECHOCARDIOGRAM LIMITED  Result Date: 04/12/2020    ECHOCARDIOGRAM LIMITED REPORT   Patient Name:   Rachel Vang Date of Exam: 04/12/2020 Medical Rec #:  [id number]          Height:       66.0 in Accession #:    [id number]         Weight:       112.0 lb Date of Birth:  July 03, 1965           BSA:          1.563 m Patient Age:    55 years           BP:           132/79 mmHg Patient Gender: F                  HR:           88 bpm. Exam Location:  Inpatient Procedure: Limited Echo, Cardiac Doppler and Color Doppler STAT ECHO Indications:    I31.3 Pericardial effusion (noninflammatory)  History:        Patient has prior history of Echocardiogram examinations, most                 recent 04/08/2020. Abnormal ECG, Stroke; Risk Factors:Diabetes.                 Pericardial effusion. Seizure. Pulmonary embolus.  Sonographer:    Sheralyn Boatman RDCS  Referring Phys: [id number] TRACI R TURNER  Sonographer Comments: Technically difficult study due to poor echo windows. IMPRESSIONS  1. Left ventricular ejection fraction, by estimation, is 40 to 45%. The left ventricle has mildly decreased function. The left ventricle demonstrates global hypokinesis.  2. Right ventricular systolic function is normal. The right ventricular size is normal.  3. A small pericardial effusion is present. The pericardial effusion is circumferential. There is no evidence of cardiac tamponade.  4. The mitral valve is normal in structure. No evidence of mitral valve regurgitation. No evidence of mitral stenosis.  5. The aortic valve is normal in structure. Aortic valve regurgitation is not visualized. No aortic stenosis is present.  6. The inferior vena cava is normal in size with greater than 50% respiratory variability, suggesting right atrial pressure of 3 mmHg. Comparison(s): Prior images reviewed side by side. Changes from prior study are noted. The left ventricular function is worsened. The pericardial effusion is smaller. FINDINGS  Left Ventricle: Left ventricular ejection fraction, by estimation, is 40 to 45%. The left ventricle has mildly decreased function. The left ventricle demonstrates global hypokinesis. The left ventricular internal cavity size was normal in size. There is  no left ventricular hypertrophy. Right Ventricle: The right ventricular size is normal. No increase in right ventricular wall thickness. Right ventricular systolic function is normal. Left Atrium: Left atrial size was normal in size. Right Atrium: Right atrial size was normal in  size. Pericardium: A small pericardial effusion is present. The pericardial effusion is circumferential. There is no evidence of cardiac tamponade. Mitral Valve: The mitral valve is normal in structure. No evidence of mitral valve stenosis. Tricuspid Valve: The tricuspid valve is normal in structure. Tricuspid valve regurgitation is not  demonstrated. No evidence of tricuspid stenosis. Aortic Valve: The aortic valve is normal in structure. Aortic valve regurgitation is not visualized. No aortic stenosis is present. Pulmonic Valve: The pulmonic valve was normal in structure. Pulmonic valve regurgitation is not visualized. No evidence of pulmonic stenosis. Aorta: The aortic root is normal in size and structure. Venous: The inferior vena cava is normal in size with greater than 50% respiratory variability, suggesting right atrial pressure of 3 mmHg. IAS/Shunts: No atrial level shunt detected by color flow Doppler. LEFT VENTRICLE PLAX 2D LVIDd:         4.16 cm LVIDs:         3.39 cm LV PW:         1.48 cm LV IVS:        1.03 cm  LV Volumes (MOD) LV vol d, MOD A2C: 43.4 ml LV vol d, MOD A4C: 72.0 ml LV vol s, MOD A2C: 23.1 ml LV vol s, MOD A4C: 35.3 ml LV SV MOD A2C:     20.3 ml LV SV MOD A4C:     72.0 ml LV SV MOD BP:      27.8 ml IVC IVC diam: 0.80 cm LEFT ATRIUM         Index LA diam:    3.40 cm 2.18 cm/m   AORTA Ao Root diam: 3.10 cm Ao Asc diam:  3.00 cm Thurmon Fair MD Electronically signed by Thurmon Fair MD Signature Date/Time: 04/12/2020/12:14:41 PM    Final     Cardiac Studies   Echo 04/12/2020 1. Left ventricular ejection fraction, by estimation, is 40 to 45%. The  left ventricle has mildly decreased function. The left ventricle  demonstrates global hypokinesis.  2. Right ventricular systolic function is normal. The right ventricular  size is normal.  3. A small pericardial effusion is present. The pericardial effusion is  circumferential. There is no evidence of cardiac tamponade.  4. The mitral valve is normal in structure. No evidence of mitral valve  regurgitation. No evidence of mitral stenosis.  5. The aortic valve is normal in structure. Aortic valve regurgitation is  not visualized. No aortic stenosis is present.  6. The inferior vena cava is normal in size with greater than 50%  respiratory variability,  suggesting right atrial pressure of 3 mmHg.   Comparison(s): Prior images reviewed side by side. Changes from prior  study are noted. The left ventricular function is worsened. The  pericardial effusion is smaller.    Echo 04/08/20: 1. Moderate pericardial effusion. The pericardial effusion is anterior to  the right ventricle. Mild RV diastolic compression without RV diastolic  collapse. Unable to assess IVC or hepatic veins. Respirometer not  performed. Study is suboptimal for  assessment of constricitive physiology. Tachycardia noted. Consider repeat  echo in 2-3 days or with clinical deterioration.  2. Left ventricular ejection fraction, by estimation, is 60 to 65%. The  left ventricle has normal function. Left ventricular endocardial border  not optimally defined to evaluate regional wall motion. Indeterminate  diastolic filling due to E-A fusion.  3. Right ventricular systolic function is normal. The right ventricular  size is normal. Tricuspid regurgitation signal is inadequate for assessing  PA pressure.  4. The mitral valve is normal in structure. No evidence of mitral valve  regurgitation. No evidence of mitral stenosis.  5. The aortic valve was not well visualized. Aortic valve regurgitation  is not visualized. No aortic stenosis is present.   Patient Profile     55 y.o. female with a hx of hypertension, hyperlipidemia, subarachnoid hemorrhage, left hemiparesis, diabetes, epilepsy, and DVTwho is being seen for the evaluation of tachycardia and pericardial effusion.  Assessment & Plan    1. Sinus tachycardia - Unclear etiology in setting of anemia, resolved sepsis, pericardiac effusion (improved) and  deconditioning. - HR is better >> increase metoprolol titrated to 50 mg bid.  2. Pericardiac effusion 3. Reduce LVEF/Acute systolic CHF - moderate by echo 04/08/20 - Limited echo yesterday showed improved effusion, now small however LVEF is down to 40-45% from 60-65%.  Normal RV.  - Given low EF>> will increase metoprolol to 50mg  >> Consolidate to long acting post G Tube placement. ADD ACE/ARB based on BP response.  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 Hx of DVT/PE - Negative for DVT/PE this admission - Xarelto held for gastrostomy tube placement tomorrow  5. Anemia - Hgb 9.2  today - Per primary team   6. PVCs and Ventricular bigeminy.  - Keep K > 4 and Mg > 2 - now Improved  - BB as above   7. Dysphagia -  for percutaneous gastrostomy tube placement today   For questions or updates, please contact [facility] Please consult [email or web address] for contact info under        Signed , PA  04/13/2020, 9:48 AM

## 2020-04-13 NOTE — Procedures (Signed)
Pre procedure Dx: Dysphagia Post Procedure Dx: Same  Successful fluoroscopic guided insertion of gastrostomy tube.   The gastrostomy tube may be used immediately for medications.   Tube feeds may be initiated in 24 hours as per the primary team.    EBL: None Complications: None immediate; Post procedural CT demonstrates appropriate positioning of G-tube.  Katherina Right, MD Pager #: [phone]

## 2020-04-13 NOTE — Progress Notes (Signed)
ANTICOAGULATION CONSULT NOTE  Pharmacy Consult for IV heparin Indication: hx pulmonary embolus and DVT  No Known Allergies  Patient Measurements: Height: 5\' 6"  (167.6 cm) Weight: 50 kg (110 lb 3.2 oz) IBW/kg (Calculated) : 59.3 Heparin Dosing Weight: actual body weight   Vital Signs: Temp: 97.6 F (36.4 C) (03/16 0622) Temp Source: Oral (03/16 0622) BP: 126/98 (03/16 1145) Pulse Rate: 104 (03/16 1145)  Labs: Recent Labs    04/11/20 0456 04/11/20 1756 04/11/20 1756 04/12/20 0111 04/12/20 0936 04/12/20 1536 04/13/20 0508  HGB 9.2*  --   --  8.4*  --   --  9.2*  HCT 30.3*  --   --  27.6*  --   --  29.6*  PLT 279  --   --  353  --   --  394  APTT  --  34  --  57*  --   --   --   LABPROT  --   --   --   --   --   --  14.8  INR  --   --   --   --   --   --  1.2  HEPARINUNFRC  --  0.14*   < > 0.23* 0.36 0.53  --   CREATININE 0.55  --   --  0.49  --   --   --    < > = values in this interval not displayed.    Estimated Creatinine Clearance: 62 mL/min (by C-G formula based on SCr of 0.49 mg/dL).   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 Xarelto resumed on admission.  Plan is for PEG placement on 3/16 at 10am and IR recommends to hold xarelto at least 24 hrs prior to procedure. Pharmacy has been consulted to bridge with heparin drip while xarelto is on hold.  - last dose of xarelto taken at [facility] on 3/13.  Baseline heparin level & APTT correlate; therefore, will use heparin level for dosing.    Per [facility], NP with IR --> hold heparin infusion at 6am on 3/16 for percutaneous gastrostomy tube placement at 10am - stop order has been placed (nursing aware)  Follow up post-procedure for resumption of anticoagulation   04/13/2020:  PM heparin level = 0.53 units/mL, remains therapeutic   CBC:  Hgb decreased to 8.4, Pltc WNL  No bleeding or infusion related concerns per RN  PEG tube placed, resume anti-coagulation > verified MD desired resume Heparin  Goal of Therapy:  Heparin level 0.3-0.7 units/ml Monitor platelets by anticoagulation protocol: Yes   Plan:   Resume Heparin infusion at 950 units/hr tonight at 8pm (6 hr post IR procedure)  Check Heparin level in am 3/17  Follow up resumption of Xarelto  04/15/2020 PharmD 04/13/2020, 2:06 PM

## 2020-04-13 NOTE — Progress Notes (Signed)
MEDICATION-RELATED CONSULT NOTE   IR Procedure Consult - Anticoagulant/Antiplatelet PTA/Inpatient Med List Review by Pharmacist    Procedure: G tube placement    Completed: 3/16 1:22 pm  Post-Procedural bleeding risk per IR MD assessment:  Standard  Antithrombotic medications on inpatient or PTA profile prior to procedure:  Was on Heparin infusion bridging from Xarelto for procedure    Recommended restart time per IR Post-Procedure Guidelines:  For therapeutic Heparin - D#0 at 6 hr after procedure.  For DOAC begin D#1 with pm dose   Other considerations:      Plan:   Resume Heparin infusion per [facility] MD. Anticipate resume Xarelto tomorrow 3/17.  Otho Bellows PharmD 04/13/2020, 1:58 PM

## 2020-04-13 NOTE — Progress Notes (Signed)
Chaplain responded to nurse referral to offer support to Ms. Rachel Vang who is reportedly very quiet and immobile.  Chaplain established relationship of care and concern for Ms. Rachel Vang.  Her voice was barely above a whisper, self-describing as feeling weak.  After a few attempts to engage and realizing this was stressful for Ms. Rachel Vang and after assuring her we all care deeply for her and her recovery, Chaplain departed.  Vernell Morgans Chaplain

## 2020-04-13 NOTE — Progress Notes (Signed)
[facility]  PROGRESS NOTE  Rachel Vang CYE:[id number] DOB: 20-Mar-1965 DOA: 04/04/2020 PCP: Patient, No Pcp Per   Brief HPI:   55 year old female with medical history of pulmonary embolism, DVT, diabetes mellitus type 2, epilepsy, GERD, hyperlipidemia, hypertension, nontraumatic subarachnoid hemorrhage, left hemiparesis, IBS, dysphagia nonambulatory/wheelchair mobility, residing at skilled facility came to ED with complaints of not eating and drinking for past 1 week prior to admission.  She was admitted with sepsis due to aspiration pneumonia from pre-existing dysphagia, dehydration and hypernatremia.  She was clinically improved.  Transferred to medical bed on 04/07/2008.  She had ongoing poor oral intake, pocketing food.  Also had persistent sinus tachycardia until etiology, legs Dopplers and CT negative for DVT and PE, 2D echo showed moderate pericardial effusion without tamponade features.  Cardiology was consulted patient agreed to PEG tube placement.  IR consulted for PEG tube placement on 04/13/2020.    Subjective   Patient seen and examined, denies any complaints.  Awaiting PEG tube placement per IR today.   Assessment/Plan:     1. Severe sepsis-secondary to aspiration pneumonia, POA-due to pre-existing dysphagia.  Patient met with sepsis criteria due to fever, tachypnea, tachycardia with hypoxemia, leukocytosis, lactic acid 6.7.  COVID-19 PCR was negative urine streptococcal antigen was negative, urine culture grew multiple species.  She received 1 dose of ceftriaxone and Zithromax on 04/04/2020.  She was switched to IV Unasyn which was changed to Augmentin after 3 days of antibiotics.  Patient completed Augmentin therapy on 04/12/2020.  CT chest on 04/08/2020 showed dependent airspace disease in the right side suggestive of aspiration. 2. Sinus tachycardia/pericardial effusion-patient had positive D-dimer, venous duplex of lower extremities was negative for DVT, CTA chest was  negative for PE.  2D echo showed normal EF but moderate pericardial effusion without tamponade.  Cardiology was consulted, dose of metoprolol was increased to 37.5 mg twice a day.  Sinus tachycardia has improved. 3. Dysphagia-chronic, likely from prior subarachnoid hemorrhage.  Not sure if patient had CVA in the past.  Speech therapy was consulted, patient advanced to a dysphagia 2 diet.  Palliative care was consulted, initially PEG tube was declined by patient has in the past PEG tube until due to leakage around the tube.  Patient was noted to be pocketing food, highly inconsistent oral intake along with difficulty taking some of the oral medications.  Plan for PEG tube placement today per IR. 4. Dehydration/hypernatremia-secondary to poor p.o. intake, presented with serum sodium of 158, resolved after treatment hypotonic half-normal saline/D5W.  Sodium is 144 as of 04/12/2020. 5. Acute hypoxemic respiratory failure-secondary to aspiration pneumonia, resolved. 6. Seizure disorder-continue Keppra 7. History of DVT/PE-patient takes Xarelto at home, currently on IV heparin for PEG tube placement.  We will switch to Xarelto before discharge. 8. Adult failure to thrive-likely poor p.o. intake due to dysphagia.  Palliative care was consulted, plan for PEG tube placement. 9. Diarrhea-resolved     COVID-19 Labs  No results for input(s): DDIMER, FERRITIN, LDH, CRP in the last 72 hours.  Lab Results  Component Value Date   SARSCOV2NAA NEGATIVE 04/09/2020   [place] NEGATIVE 04/04/2020     Scheduled medications:   . bethanechol  10 mg Oral TID  . Chlorhexidine Gluconate Cloth  6 each Topical Daily  . feeding supplement  237 mL Oral TID BM  . fentaNYL      . gabapentin  200 mg Oral Q12H  . glucagon (human recombinant)      . levETIRAcetam  750  mg Oral BID  . lidocaine      . lidocaine      . mouth rinse  15 mL Mouth Rinse BID  . metoprolol tartrate  50 mg Oral BID  . midazolam      .  mirtazapine  15 mg Oral QHS  . rosuvastatin  5 mg Oral Daily  . sertraline  50 mg Oral Daily  . traZODone  100 mg Oral QHS         CBG: Recent Labs  Lab 04/12/20 1202 04/12/20 1633 04/13/20 0012 04/13/20 0618 04/13/20 1232  GLUCAP 99 87 93 89 139*    SpO2: 100 % O2 Flow Rate (L/min): 2 L/min    CBC: Recent Labs  Lab 04/09/20 0522 04/10/20 0535 04/11/20 0456 04/12/20 0111 04/13/20 0508  WBC 7.7 7.4 6.7 8.7 8.6  HGB 8.9* 9.2* 9.2* 8.4* 9.2*  HCT 29.0* 29.2* 30.3* 27.6* 29.6*  MCV 99.7 99.0 100.0 103.0* 100.0  PLT 171 250 279 353 568    Basic Metabolic Panel: Recent Labs  Lab 04/07/20 0430 04/07/20 1133 04/08/20 0532 04/09/20 0522 04/10/20 0535 04/11/20 0456 04/12/20 0111  NA 137   < > 140 143 143 144 144  K 2.9*   < > 3.9 3.3* 3.0* 3.9 3.5  CL 101   < > 107 110 109 110 111  CO2 26   < > 23 24 26 25 24   GLUCOSE 101*   < > 106* 114* 121* 118* 108*  BUN 6   < > <5* <5* <5* <5* <5*  CREATININE 0.53   < > 0.53 0.51 0.58 0.55 0.49  CALCIUM 8.0*   < > 8.7* 8.4* 8.7* 9.1 8.7*  MG 2.1  --   --   --  1.8  --  2.0   < > = values in this interval not displayed.     Liver Function Tests: Recent Labs  Lab 04/08/20 0532  AST 16  ALT 13  ALKPHOS 107  BILITOT 0.8  PROT 6.6  ALBUMIN 2.7*     Antibiotics: Anti-infectives (From admission, onward)   Start     Dose/Rate Route Frequency Ordered Stop   04/13/20 1120  ceFAZolin (ANCEF) 2-4 GM/100ML-% IVPB       Note to Pharmacy: Hilma Favors   : cabinet override      04/13/20 1120 04/13/20 1130   04/08/20 1030  amoxicillin-clavulanate (AUGMENTIN) 400-57 MG/5ML suspension 875 mg        875 mg Oral Every 12 hours 04/08/20 0935 04/12/20 2216   04/07/20 2200  amoxicillin-clavulanate (AUGMENTIN) 875-125 MG per tablet 1 tablet  Status:  Discontinued        1 tablet Oral Every 12 hours 04/07/20 1417 04/08/20 0935   04/05/20 0400  Ampicillin-Sulbactam (UNASYN) 3 g in sodium chloride 0.9 % 100 mL IVPB  Status:   Discontinued        3 g 200 mL/hr over 30 Minutes Intravenous Every 8 hours 04/04/20 2047 04/07/20 1417   04/04/20 1700  cefTRIAXone (ROCEPHIN) 2 g in sodium chloride 0.9 % 100 mL IVPB  Status:  Discontinued        2 g 200 mL/hr over 30 Minutes Intravenous Every 24 hours 04/04/20 1657 04/04/20 2026   04/04/20 1700  azithromycin (ZITHROMAX) 500 mg in sodium chloride 0.9 % 250 mL IVPB  Status:  Discontinued        500 mg 250 mL/hr over 60 Minutes Intravenous Every 24 hours 04/04/20 1657 04/04/20 2026  DVT prophylaxis: Heparin  Code Status: Full code  Family Communication: No family at bedside   Consultants:    Procedures:      Objective   Vitals:   04/13/20 1135 04/13/20 1140 04/13/20 1145 04/13/20 1408  BP: (!) 133/99 (!) 126/95 (!) 126/98 116/88  Pulse: (!) 108 (!) 109 (!) 104 97  Resp:  15 17 15   Temp:    99.1 F (37.3 C)  TempSrc:    Oral  SpO2: 97% 99% 98% 100%  Weight:      Height:        Intake/Output Summary (Last 24 hours) at 04/13/2020 1626 Last data filed at 04/13/2020 1517 Gross per 24 hour  Intake 1730.53 ml  Output --  Net 1730.53 ml    03/14 1901 - 03/16 0700 In: 1680.3 [P.O.:100; I.V.:1580.3] Out: 950 [Urine:950]  Filed Weights   04/04/20 2348 04/12/20 1600  Weight: 50.8 kg 50 kg    Physical Examination:   General-appears in no acute distress Heart-S1-S2, regular, no murmur auscultated Lungs-clear to auscultation bilaterally, no wheezing or crackles auscultated Abdomen-soft, nontender, no organomegaly Extremities-no edema in the lower extremities Neuro-alert, oriented x3, bilateral contractures in lower extremities  Status is: Inpatient  Dispo: The patient is from: Skilled nursing facility              Anticipated d/c is to: Skilled nursing facility              Anticipated d/c date is: 04/14/2020              Patient currently not stable for discharge  Barrier to discharge-plan for PEG tube placement today             Data Reviewed:   Recent Results (from the past 240 hour(s))  Blood culture (routine x 2)     Status: None   Collection Time: 04/04/20  5:25 PM   Specimen: BLOOD RIGHT FOREARM  Result Value Ref Range Status   Specimen Description   Final    BLOOD RIGHT FOREARM Performed at [facility] [address] [place], [place] [address]    Special Requests   Final    BACTERIAL CASTS Blood Culture adequate volume Performed at [facility] [address] [place], [place] [address]    Culture   Final    NO GROWTH 5 DAYS Performed at [facility], [place] [address], [place], [place] [address]    Report Status 04/09/2020 FINAL  Final  Blood culture (routine x 2)     Status: None   Collection Time: 04/04/20  5:25 PM   Specimen: BLOOD LEFT FOREARM  Result Value Ref Range Status   Specimen Description   Final    BLOOD LEFT FOREARM Performed at [facility] [address] [place], [place] [address]    Special Requests   Final    BLOOD Blood Culture results may not be optimal due to an inadequate volume of blood received in culture bottles Performed at [facility] [address], [place], [place] [address]    Culture   Final    NO GROWTH 5 DAYS Performed at [facility], [place] [address] [place], [place] [address]    Report Status 04/09/2020 FINAL  Final  Resp Panel by RT-PCR (Flu A&B, Covid) Nasopharyngeal Swab     Status: None   Collection Time: 04/04/20  5:25 PM   Specimen: Nasopharyngeal Swab; Nasopharyngeal(NP) swabs in vial  transport medium  Result Value Ref Range Status   SARS Coronavirus 2 by RT PCR NEGATIVE NEGATIVE Final    Comment: (NOTE) SARS-CoV-2 target nucleic acids are NOT DETECTED.  The SARS-CoV-2 RNA is generally detectable in upper respiratory specimens during the acute phase of infection. The lowest concentration of SARS-CoV-2 viral copies this assay can detect  is 138 copies/mL. A negative result does not preclude SARS-Cov-2 infection and should not be used as the sole basis for treatment or other patient management decisions. A negative result may occur with  improper specimen collection/handling, submission of specimen other than nasopharyngeal swab, presence of viral mutation(s) within the areas targeted by this assay, and inadequate number of viral copies(<138 copies/mL). A negative result must be combined with clinical observations, patient history, and epidemiological information. The expected result is Negative.  Fact Sheet for Patients:  EntrepreneurPulse.com.au  Fact Sheet for Healthcare Providers:  IncredibleEmployment.be  This test is no t yet approved or cleared by the [place] FDA and  has been authorized for detection and/or diagnosis of SARS-CoV-2 by FDA under an Emergency Use Authorization (EUA). This EUA will remain  in effect (meaning this test can be used) for the duration of the COVID-19 declaration under Section 564(b)(1) of the Act, 21 U.S.C.section 360bbb-3(b)(1), unless the authorization is terminated  or revoked sooner.       Influenza A by PCR NEGATIVE NEGATIVE Final   Influenza B by PCR NEGATIVE NEGATIVE Final    Comment: (NOTE) The Xpert Xpress SARS-CoV-2/FLU/RSV plus assay is intended as an aid in the diagnosis of influenza from Nasopharyngeal swab specimens and should not be used as a sole basis for treatment. Nasal washings and aspirates are unacceptable for Xpert Xpress SARS-CoV-2/FLU/RSV testing.  Fact Sheet for Patients: EntrepreneurPulse.com.au  Fact Sheet for Healthcare Providers: IncredibleEmployment.be  This test is not yet approved or cleared by the [place] FDA and has been authorized for detection and/or diagnosis of SARS-CoV-2 by FDA under an Emergency Use Authorization (EUA). This EUA will remain in effect  (meaning this test can be used) for the duration of the COVID-19 declaration under Section 564(b)(1) of the Act, 21 U.S.C. section 360bbb-3(b)(1), unless the authorization is terminated or revoked.  Performed at [facility], [place] [address] [place], [place] [address]   Urine culture     Status: Abnormal   Collection Time: 04/04/20 10:09 PM   Specimen: In/Out Cath Urine  Result Value Ref Range Status   Specimen Description   Final    IN/OUT CATH URINE Performed at [facility] [address] [place], [place] [address]    Special Requests   Final    NONE Performed at [facility], [place] [address], [place], [place] [address]    Culture MULTIPLE SPECIES PRESENT, SUGGEST RECOLLECTION (A)  Final   Report Status 04/06/2020 FINAL  Final  MRSA PCR Screening     Status: None   Collection Time: 04/04/20 11:20 PM   Specimen: Nasopharyngeal  Result Value Ref Range Status   MRSA by PCR NEGATIVE NEGATIVE Final    Comment:        The GeneXpert MRSA Assay (FDA approved for NASAL specimens only), is one component of a comprehensive MRSA colonization surveillance program. It is not intended to diagnose MRSA infection nor to guide or monitor treatment for MRSA infections. Performed at [facility], [place] [address], [place], [place] [address]   SARS CORONAVIRUS 2 (TAT 6-24 HRS) Nasopharyngeal Nasopharyngeal Swab  Status: None   Collection Time: 04/09/20  6:15 AM   Specimen: Nasopharyngeal Swab  Result Value Ref Range Status   SARS Coronavirus 2 NEGATIVE NEGATIVE Final    Comment: (NOTE) SARS-CoV-2 target nucleic acids are NOT DETECTED.  The SARS-CoV-2 RNA is generally detectable in upper and lower respiratory specimens during the acute phase of infection. Negative results do not preclude SARS-CoV-2 infection, do not rule out co-infections with other pathogens, and should not be used as the sole basis for  treatment or other patient management decisions. Negative results must be combined with clinical observations, patient history, and epidemiological information. The expected result is Negative.  Fact Sheet for Patients: SugarRoll.be  Fact Sheet for Healthcare Providers: https://www.woods-mathews.com/  This test is not yet approved or cleared by the [place] FDA and  has been authorized for detection and/or diagnosis of SARS-CoV-2 by FDA under an Emergency Use Authorization (EUA). This EUA will remain  in effect (meaning this test can be used) for the duration of the COVID-19 declaration under Se ction 564(b)(1) of the Act, 21 U.S.C. section 360bbb-3(b)(1), unless the authorization is terminated or revoked sooner.  Performed at [facility], [place] [address] [place], [place] [address]     No results for input(s): LIPASE, AMYLASE in the last 168 hours. No results for input(s): AMMONIA in the last 168 hours.  Cardiac Enzymes: No results for input(s): CKTOTAL, CKMB, CKMBINDEX, TROPONINI in the last 168 hours. BNP (last 3 results) No results for input(s): BNP in the last 8760 hours.  ProBNP (last 3 results) No results for input(s): PROBNP in the last 8760 hours.  Studies:  CT ABDOMEN WO CONTRAST  Result Date: 04/13/2020 CLINICAL DATA:  Dysphagia. Failure to thrive. Post percutaneous gastrostomy tube placement. Evaluate appropriate positioning giving challenging anatomy demonstrated chest CT performed 04/08/2020 EXAM: CT ABDOMEN WITHOUT CONTRAST TECHNIQUE: Multidetector CT imaging of the abdomen was performed following the standard protocol without IV contrast. COMPARISON:  Chest CT-04/08/2020 FINDINGS: The lack of intravenous contrast limits the ability to evaluate solid abdominal organs. Lower chest: Limited visualization the lower thorax demonstrates minimal bibasilar slightly nodular interstitial opacities, right greater than left.  Minimal subsegmental atelectasis involving the imaged portions of the bilateral lower lobes, right greater than left. No pleural effusion or pneumothorax. Cardiomegaly. Coronary artery calcifications. Trace amount of pericardial fluid, presumably physiologic. Hepatobiliary: Normal hepatic contour. Normal noncontrast appearance of the gallbladder given degree of distention. No radiopaque gallstones. No intra or extrahepatic biliary ductal dilatation. Ill-defined punctate (approximately 0.8 cm) hypo attenuating nodule within the dome of the liver (image 10, series 2), incompletely characterized and likely too small to accurately characterize though appears morphologically similar to the 08/2017 examination and thus favored to represent a hepatic cyst. No ascites. Pancreas: Normal noncontrast appearance of the pancreas. Spleen: There is a punctate granuloma adjacent to mid inferior aspect of the spleen (image 23, series 2), similar to the 2019 examination. Note is made of a small splenule about the splenic hilum. Adrenals/Urinary Tract: Suspected punctate (2 mm) nonobstructing left-sided renal stone (image 33, series 2). No evidence of right-sided nephrolithiasis. No urinary obstruction or perinephric stranding Normal noncontrast appearance the bilateral adrenal glands. The urinary bladder was not imaged. Stomach/Bowel: Appropriately positioned pull-through gastrostomy tube with internal-disc appropriately positioned adjacent to the inner wall the gastric lumen. There is no evidence injury to either the liver parenchyma, transverse or small bowel though the splenic flexure of the transverse colon extends superior to the entrance site of the gastrostomy  tube, however the gastrostomy tube does NOT extend through the colonic mesentery. Interval progression marked distension of the several loops of mid small bowel with index loop of small bowel within the right mid hemiabdomen measuring approximately 5.4 cm in diameter  (image 32, series 2). Enteric contrast, administered during the time of gastrostomy tube placement extends to the proximal jejunum. Vascular/Lymphatic: Scattered atherosclerotic plaque within normal caliber abdominal aorta. No bulky retroperitoneal or mesenteric lymphadenopathy on this noncontrast examination. Other: Expected minimal amount of subcutaneous stranding about the gastrostomy tube insertion site which is adjacent to the tract of the previous gastrostomy tube. Musculoskeletal: No acute or aggressive osseous abnormalities. IMPRESSION: 1. Post uncomplicated placement of pull-through percutaneous gastrostomy tube. 2. Marked distension of several loops, incompletely evaluated as the site of obstruction was imaged on present examination. Clinical correlation is advised. Further evaluation with complete CT scan the abdomen and pelvis could be performed indicated. 3. Solitary punctate (2 mm) left-sided nonobstructing renal stone. 4.  Aortic Atherosclerosis (ICD10-I70.0). Electronically Signed   By: Sandi Mariscal M.D.   On: 04/13/2020 15:46   IR GASTROSTOMY TUBE MOD SED  Result Date: 04/13/2020 INDICATION: Previous history chronic feeding gastrostomy tube now with recurrent dysphagia and failure to thrive. Please image guided gastrostomy placement or enteric nutrition supplementation purposes. EXAM: PULL TROUGH GASTROSTOMY TUBE PLACEMENT COMPARISON:  Chest CT - 04/08/2020; fluoroscopic guided gastrostomy tube exchange-06/08/2016 MEDICATIONS: Patient currently admitted to the hospital receiving intravenous antibiotics; Antibiotics were administered within 1 hour of the procedure. Glucagon 1 mg IV CONTRAST:  25 mL of Omnipaque 300 administered into the gastric lumen. ANESTHESIA/SEDATION: Moderate (conscious) sedation was employed during this procedure. A total of Versed 2 mg and Fentanyl 100 mcg was administered intravenously. Moderate Sedation Time: 19 minutes. The patient's level of consciousness and vital signs  were monitored continuously by radiology nursing throughout the procedure under my direct supervision. FLUOROSCOPY TIME:  5 minutes, 54 seconds (54 mGy) COMPLICATIONS: None immediate. PROCEDURE: Informed written consent was obtained from the patient's family following explanation of the procedure, risks, benefits and alternatives. A time out was performed prior to the initiation of the procedure. Ultrasound scanning was performed to demarcate the edge of the left lobe of the liver. Maximal barrier sterile technique utilized including caps, mask, sterile gowns, sterile gloves, large sterile drape, hand hygiene and Betadine prep. The left upper quadrant was sterilely prepped and draped. An oral gastric catheter was inserted into the stomach under fluoroscopy. The existing nasogastric feeding tube was removed. The left costal margin and air opacified transverse colon were identified and avoided. Air was injected into the stomach for insufflation and visualization under fluoroscopy. Under sterile conditions a 17 gauge trocar needle was utilized to access the stomach percutaneously beneath the left subcostal margin after the overlying soft tissues were anesthetized with 1% Lidocaine with epinephrine. Needle position was confirmed within the stomach with aspiration of air and injection of small amount of contrast. A single T tack was deployed for gastropexy. Over an Amplatz guide wire, a 9-French sheath was inserted into the stomach. A snare device was utilized to capture the oral gastric catheter. The snare device was pulled retrograde from the stomach up the esophagus and out the oropharynx. The 20-French pull-through gastrostomy was connected to the snare device and pulled antegrade through the oropharynx down the esophagus into the stomach and then through the percutaneous tract external to the patient. The gastrostomy was assembled externally. Contrast injection confirms position in the stomach. Several spot  radiographic images were  obtained in various obliquities for documentation. The patient tolerated procedure well without immediate post procedural complication. FINDINGS: After successful fluoroscopic guided placement, the gastrostomy tube is appropriately positioned with internal disc against the ventral aspect of the gastric lumen. IMPRESSION: Successful fluoroscopic insertion of a 20-French pull-through gastrostomy tube. The gastrostomy may be used immediately for medication administration and in 24 hrs for the initiation of feeds. Note, given marked distension of multiple loops of large and small bowel and associated suboptimal percutaneous window, a preprocedural noncontrast abdominal CT was obtained confirming appropriate positioning of the gastrostomy tube. Electronically Signed   By: Sandi Mariscal M.D.   On: 04/13/2020 14:10   ECHOCARDIOGRAM LIMITED  Result Date: 04/12/2020    ECHOCARDIOGRAM LIMITED REPORT   Patient Name:   RAINBOW SALMAN Date of Exam: 04/12/2020 Medical Rec #:  [id number]          Height:       66.0 in Accession #:    [id number]         Weight:       112.0 lb Date of Birth:  November 09, 1965           BSA:          1.563 m Patient Age:    27 years           BP:           132/79 mmHg Patient Gender: F                  HR:           88 bpm. Exam Location:  Inpatient Procedure: Limited Echo, Cardiac Doppler and Color Doppler STAT ECHO Indications:    I31.3 Pericardial effusion (noninflammatory)  History:        Patient has prior history of Echocardiogram examinations, most                 recent 04/08/2020. Abnormal ECG, Stroke; Risk Factors:Diabetes.                 Pericardial effusion. Seizure. Pulmonary embolus.  Sonographer:    Roseanna Rainbow RDCS Referring Phys: [place]  Sonographer Comments: Technically difficult study due to poor echo windows. IMPRESSIONS  1. Left ventricular ejection fraction, by estimation, is 40 to 45%. The left ventricle has mildly decreased function. The left  ventricle demonstrates global hypokinesis.  2. Right ventricular systolic function is normal. The right ventricular size is normal.  3. A small pericardial effusion is present. The pericardial effusion is circumferential. There is no evidence of cardiac tamponade.  4. The mitral valve is normal in structure. No evidence of mitral valve regurgitation. No evidence of mitral stenosis.  5. The aortic valve is normal in structure. Aortic valve regurgitation is not visualized. No aortic stenosis is present.  6. The inferior vena cava is normal in size with greater than 50% respiratory variability, suggesting right atrial pressure of 3 mmHg. Comparison(s): Prior images reviewed side by side. Changes from prior study are noted. The left ventricular function is worsened. The pericardial effusion is smaller. FINDINGS  Left Ventricle: Left ventricular ejection fraction, by estimation, is 40 to 45%. The left ventricle has mildly decreased function. The left ventricle demonstrates global hypokinesis. The left ventricular internal cavity size was normal in size. There is  no left ventricular hypertrophy. Right Ventricle: The right ventricular size is normal. No increase in right ventricular wall thickness. Right ventricular systolic function is normal. Left Atrium: Left atrial  size was normal in size. Right Atrium: Right atrial size was normal in size. Pericardium: A small pericardial effusion is present. The pericardial effusion is circumferential. There is no evidence of cardiac tamponade. Mitral Valve: The mitral valve is normal in structure. No evidence of mitral valve stenosis. Tricuspid Valve: The tricuspid valve is normal in structure. Tricuspid valve regurgitation is not demonstrated. No evidence of tricuspid stenosis. Aortic Valve: The aortic valve is normal in structure. Aortic valve regurgitation is not visualized. No aortic stenosis is present. Pulmonic Valve: The pulmonic valve was normal in structure. Pulmonic valve  regurgitation is not visualized. No evidence of pulmonic stenosis. Aorta: The aortic root is normal in size and structure. Venous: The inferior vena cava is normal in size with greater than 50% respiratory variability, suggesting right atrial pressure of 3 mmHg. IAS/Shunts: No atrial level shunt detected by color flow Doppler. LEFT VENTRICLE PLAX 2D LVIDd:         4.16 cm LVIDs:         3.39 cm LV PW:         1.48 cm LV IVS:        1.03 cm  LV Volumes (MOD) LV vol d, MOD A2C: 43.4 ml LV vol d, MOD A4C: 72.0 ml LV vol s, MOD A2C: 23.1 ml LV vol s, MOD A4C: 35.3 ml LV SV MOD A2C:     20.3 ml LV SV MOD A4C:     72.0 ml LV SV MOD BP:      27.8 ml IVC IVC diam: 0.80 cm LEFT ATRIUM         Index LA diam:    3.40 cm 2.18 cm/m   AORTA Ao Root diam: 3.10 cm Ao Asc diam:  3.00 cm Dani Gobble Croitoru MD Electronically signed by Sanda Klein MD Signature Date/Time: 04/12/2020/12:14:41 PM    Final        Oswald Hillock   [facility] If 7PM-7AM, please contact night-coverage at [email or web address], Office  [phone]   04/13/2020, 4:26 PM  LOS: 8 days

## 2020-04-13 NOTE — Plan of Care (Signed)
  Problem: Education: Goal: Knowledge of General Education information will improve Description: Including pain rating scale, medication(s)/side effects and non-pharmacologic comfort measures Outcome: Progressing   Problem: Clinical Measurements: Goal: Cardiovascular complication will be avoided Outcome: Progressing   Problem: Activity: Goal: Risk for activity intolerance will decrease Outcome: Progressing   Problem: Nutrition: Goal: Adequate nutrition will be maintained Outcome: Progressing   Problem: Coping: Goal: Level of anxiety will decrease Outcome: Progressing   Problem: Pain Managment: Goal: General experience of comfort will improve Outcome: Progressing   Problem: Safety: Goal: Ability to remain free from injury will improve Outcome: Progressing

## 2020-04-14 DIAGNOSIS — I82409 Acute embolism and thrombosis of unspecified deep veins of unspecified lower extremity: Secondary | ICD-10-CM

## 2020-04-14 DIAGNOSIS — I69359 Hemiplegia and hemiparesis following cerebral infarction affecting unspecified side: Secondary | ICD-10-CM

## 2020-04-14 LAB — CBC
HCT: 32.2 % — ABNORMAL LOW (ref 36.0–46.0)
Hemoglobin: 10 g/dL — ABNORMAL LOW (ref 12.0–15.0)
MCH: 31.3 pg (ref 26.0–34.0)
MCHC: 31.1 g/dL (ref 30.0–36.0)
MCV: 100.9 fL — ABNORMAL HIGH (ref 80.0–100.0)
Platelets: 425 10*3/uL — ABNORMAL HIGH (ref 150–400)
RBC: 3.19 MIL/uL — ABNORMAL LOW (ref 3.87–5.11)
RDW: 16.3 % — ABNORMAL HIGH (ref 11.5–15.5)
WBC: 8.6 10*3/uL (ref 4.0–10.5)
nRBC: 0 % (ref 0.0–0.2)

## 2020-04-14 LAB — GLUCOSE, CAPILLARY
Glucose-Capillary: 96 mg/dL (ref 70–99)
Glucose-Capillary: 123 mg/dL — ABNORMAL HIGH (ref 70–99)

## 2020-04-14 LAB — BASIC METABOLIC PANEL
Anion gap: 9 (ref 5–15)
BUN: <5 mg/dL — ABNORMAL LOW (ref 6–20)
CO2: 23 mmol/L (ref 22–32)
Calcium: 8.6 mg/dL — ABNORMAL LOW (ref 8.9–10.3)
Chloride: 107 mmol/L (ref 98–111)
Creatinine, Ser: 0.49 mg/dL (ref 0.44–1.00)
GFR, Estimated: >60 mL/min (ref >60)
Glucose, Bld: 103 mg/dL — ABNORMAL HIGH (ref 70–99)
Potassium: 3.3 mmol/L — ABNORMAL LOW (ref 3.5–5.1)
Sodium: 139 mmol/L (ref 135–145)

## 2020-04-14 LAB — HEPARIN LEVEL (UNFRACTIONATED): Heparin Unfractionated: 0.38 IU/mL (ref 0.30–0.70)

## 2020-04-14 MED ORDER — LOSARTAN POTASSIUM 50 MG PO TABS
25.0000 mg | ORAL_TABLET | Freq: Every day | ORAL | Status: DC
  Administered 2020-04-14: 25 mg via ORAL
  Filled 2020-04-14: qty 1

## 2020-04-14 MED ORDER — RIVAROXABAN 20 MG PO TABS
20.0000 mg | ORAL_TABLET | Freq: Every day | ORAL | Status: DC
  Administered 2020-04-14: 20 mg via ORAL
  Filled 2020-04-14: qty 1

## 2020-04-14 MED ORDER — POTASSIUM CHLORIDE CRYS ER 20 MEQ PO TBCR
40.0000 meq | EXTENDED_RELEASE_TABLET | Freq: Once | ORAL | Status: AC
  Administered 2020-04-14: 40 meq via ORAL
  Filled 2020-04-14: qty 2

## 2020-04-14 MED ORDER — LEVETIRACETAM 100 MG/ML PO SOLN: 750.0000 mg | Freq: Two times a day (BID) | ORAL | 12 refills | Status: AC

## 2020-04-14 MED ORDER — METOPROLOL TARTRATE 25 MG/10 ML ORAL SUSPENSION: 50.0000 mg | Freq: Two times a day (BID) | ORAL | 2 refills | Status: AC

## 2020-04-14 MED ORDER — POTASSIUM CHLORIDE CRYS ER 20 MEQ PO TBCR: 40.0000 meq | EXTENDED_RELEASE_TABLET | Freq: Once | ORAL | Status: DC

## 2020-04-14 MED ORDER — LOSARTAN POTASSIUM 25 MG PO TABS: 25.0000 mg | ORAL_TABLET | Freq: Every day | ORAL | 2 refills | Status: AC

## 2020-04-14 MED ORDER — RIVAROXABAN 20 MG PO TABS: 20.0000 mg | ORAL_TABLET | Freq: Every day | ORAL | Status: DC

## 2020-04-14 NOTE — Plan of Care (Signed)
  Problem: Education: Goal: Knowledge of General Education information will improve Description: Including pain rating scale, medication(s)/side effects and non-pharmacologic comfort measures Outcome: Progressing   Problem: Activity: Goal: Risk for activity intolerance will decrease Outcome: Progressing   Problem: Nutrition: Goal: Adequate nutrition will be maintained Outcome: Progressing   Problem: Coping: Goal: Level of anxiety will decrease Outcome: Progressing   Problem: Elimination: Goal: Will not experience complications related to bowel motility Outcome: Progressing   Problem: Skin Integrity: Goal: Risk for impaired skin integrity will decrease Outcome: Progressing   

## 2020-04-14 NOTE — Progress Notes (Signed)
ANTICOAGULATION CONSULT NOTE  Pharmacy Consult for Xarelto Indication: hx pulmonary embolus and DVT  No Known Allergies  Patient Measurements: Height: 5\' 6"  (167.6 cm) Weight: 50 kg (110 lb 3.2 oz) IBW/kg (Calculated) : 59.3 Heparin Dosing Weight: actual body weight   Vital Signs: Temp: 97.9 F (36.6 C) (03/17 0550) Temp Source: Oral (03/17 0550) BP: 152/95 (03/17 0550) Pulse Rate: 94 (03/17 0550)  Labs: Recent Labs    04/11/20 1756 04/11/20 1756 04/12/20 0111 04/12/20 0936 04/12/20 1536 04/13/20 0508 04/14/20 0531  HGB  --    < > 8.4*  --   --  9.2* 10.0*  HCT  --   --  27.6*  --   --  29.6* 32.2*  PLT  --   --  353  --   --  394 425*  APTT 34  --  57*  --   --   --   --   LABPROT  --   --   --   --   --  14.8  --   INR  --   --   --   --   --  1.2  --   HEPARINUNFRC 0.14*  --  0.23* 0.36 0.53  --  0.38  CREATININE  --   --  0.49  --   --   --  0.49   < > = values in this interval not displayed.    Estimated Creatinine Clearance: 62 mL/min (by C-G formula based on SCr of 0.49 mg/dL).   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 Xarelto resumed on admission, then held for planned PEG placement.  She is now s/p PEG on 3/16, heparin resumed on 3/16 PM.  She will be discharged on 3/17 and pharmacy is consulted to resume Xarelto just prior to discharge.   04/14/2020:  Heparin level = 0.38 units/mL, therapeutic   CBC: Hgb 10-low but improved, Pltc WNL  No bleeding or infusion related concerns per RN  Goal of Therapy:  Heparin level 0.3-0.7 units/ml Monitor platelets by anticoagulation protocol: Yes   Plan:   Continue Heparin infusion at 950 units/hr until discharge time is know.  D/C heparin drip at the same time as Xarelto is given.  Resume Xarelto 20mg  PO daily with supper just prior to  discharge.   4/17 PharmD, BCPS Clinical Pharmacist [facility] main pharmacy [phone] 04/14/2020 11:30 AM

## 2020-04-14 NOTE — Plan of Care (Signed)
Attempted to call facility [facility] 9 times to give report. Was sent to voicemail every time. PTAR her to transport patient now. Son Calmar notified. IVs removed and telemetry removed.

## 2020-04-14 NOTE — TOC Transition Note (Addendum)
Transition of Care [facility]) - CM/SW Discharge Note   Patient Details  Name: Rachel Vang MRN: [id number] Date of Birth: 12/30/1965  Transition of Care [facility]) CM/SW Contact:  Lanier Clam, RN Phone Number: 04/14/2020, 12:10 PM   Clinical Narrative:d/c today returning back to [facility] rep Tina aware-going to rm 230,Nsg call report tel#[phone].PTAR for transport.  Updated d/c summary for jevity 1.2-patient was already on PTA. No further CM needs.      Final next level of care: Skilled Nursing Facility Barriers to Discharge: No Barriers Identified   Patient Goals and CMS Choice Patient states their goals for this hospitalization and ongoing recovery are:: return back to rehab CMS Medicare.gov Compare Post Acute Care list provided to:: Patient Represenative (must comment) Choice offered to / list presented to : Patient  Discharge Placement              Patient chooses bed at: Other - please specify in the comment section below: [facility]) Patient to be transferred to facility by: PTAR Name of family member notified: [name] son [phone] Patient and family notified of of transfer: 04/14/20  Discharge Plan and Services   Discharge Planning Services: CM Consult Post Acute Care Choice:  (room 230)                               Social Determinants of Health (SDOH) Interventions     Readmission Risk Interventions No flowsheet data found.

## 2020-04-14 NOTE — Discharge Instructions (Signed)
Aspiration Pneumonia, Adult  Aspiration pneumonia is an infection that occurs after lung (pulmonary) aspiration. Pulmonary aspiration is when you inhale a large amount of food, liquid, stomach acid, or saliva into the lungs. This can cause inflammation and infection in the lungs. This can make you cough and make it hard to breathe. Aspiration pneumonia is a serious condition and can be life-threatening. What are the causes? This condition may be caused by:  Bacteria in food, liquid, stomach acid, or saliva that is inhaled into the lung.  Irritation and inflammation that results from material, such as blood or a foreign body, being inhaled into the lung. This can lead to an infection even though the material is not originally contaminated with bacteria. What increases the risk? You are more likely to get aspiration pneumonia if you have a condition that makes it hard to breathe, swallow, cough, or gag. These conditions may include:  A breathing disorder, such as chronic obstructive pulmonary disease, that makes it hard to eat or drink while breathing.  A brain (neurologic) disorder, such as stroke, seizures, Parkinson's disease, dementia, amyotrophic lateral sclerosis (ALS), or brain injury.  Having gastroesophageal reflux disease (GERD).  Having a weak disease-fighting system (immune system).  Having a narrowing of the tube that carries food to the stomach (esophageal narrowing). Other factors that may make you more likely to get aspiration pneumonia include:  Being older than age 60 and frail.  Being given a general anesthetic for procedures.  Drinking too much alcohol and passing out. If you pass out and vomit, then vomit can be inhaled into your lungs.  Taking certain medicines, such as tranquilizers or sedatives.  Taking poor care of your mouth and teeth.  Being malnourished. What are the signs or symptoms? The main symptom of pulmonary aspiration may be an episode of choking  or coughing while eating or drinking. When aspiration pneumonia develops, symptoms include:  Persistent cough.  Difficulty breathing, such as wheezing or shortness of breath.  Fever.  Chest pain.  Being more tired than usual (fatigue). Pulmonary aspiration may be silent, meaning that it is not associated with coughing or choking while eating or drinking. How is this diagnosed? This condition may be diagnosed based on:  A physical exam.  Tests, such as: ? A chest X-ray. ? A sputum culture. Saliva and mucus (sputum) are collected from the lungs or the tubes that carry air to the lungs (bronchi). The sputum is then tested for bacteria. ? Oximetry. A sensor or clip is placed on areas such as a finger, earlobe, or toe to measure the oxygen level in your blood. ? Blood tests. ? A swallowing study. This test looks at how food is swallowed and whether it goes into your windpipe (trachea) or esophagus. ? A bronchoscopy. This test uses a flexible tube (bronchoscope) to see inside the lungs. How is this treated? This condition may be treated with:  Medicines. Antibiotic medicine will be given to kill the pneumonia bacteria. Other medicines may also be used to reduce fever, pain, or inflammation.  Breathing assistance and oxygen therapy. Depending on how well you are breathing, you may need to be given oxygen, or you may need breathing support from a breathing machine (ventilator).  Thoracentesis. This is a procedure to remove fluid that has built up in the space between the linings of the chest wall and the lungs.  Dietary changes. You may need to avoid certain food textures or liquids. For people who have recurrent aspiration pneumonia,   a feeding tube might be placed in the stomach for nutrition. Follow these instructions at home: Medicines  Take over-the-counter and prescription medicines only as told by your health care provider.  If you were prescribed an antibiotic medicine, take  it as told by your health care provider. Do not stop taking the antibiotic even if you start to feel better.  Take cough medicine only if you are losing sleep. Cough medicine can prevent your body's natural ability to remove mucus from your lungs.   General instructions  Carefully follow any eating instructions you were given, such as avoiding certain food textures or thickening your liquids. Thickening liquids reduces the risk of developing aspiration pneumonia again.  Return to normal activities as told by your health care provider. Ask your health care provider what activities are safe for you.  Sleep in a semi-upright position at night. Try to sleep in a reclining chair, or place a few pillows under your head in bed.  Do not use any products that contain nicotine or tobacco, such as cigarettes, e-cigarettes, and chewing tobacco. If you need help quitting, ask your health care provider.  Keep all follow-up visits as told by your health care provider. This is important.   Contact a health care provider if you:  Have a fever.  Are coughing or choking while eating or drinking.  Continue to have signs or symptoms of aspiration pneumonia. Get help right away if you have:  Worsening shortness of breath, wheezing, or difficulty breathing.  Chest pain. Summary  Aspiration pneumonia is an infection that occurs after lung (pulmonary) aspiration. Pulmonary aspiration is when you inhale a large amount of food, liquid, stomach acid, or saliva into the lungs.  The main symptom of pulmonary aspiration may be an episode of choking or coughing. It may also be silent without coughing or choking.  You are more likely to get aspiration pneumonia if you have a condition that makes it hard to breathe, swallow, cough, or gag. This information is not intended to replace advice given to you by your health care provider. Make sure you discuss any questions you have with your health care provider. Document  Revised: 01/16/2019 Document Reviewed: 01/16/2019 Elsevier Patient Education  2021 [employer]   Sepsis, Diagnosis, Adult Sepsis is a serious bodily reaction to an infection. The infection that triggers sepsis may be from a bacteria, virus, or fungus. Sepsis can result from an infection in any part of your body. Infections that commonly lead to sepsis include skin, lung, and urinary tract infections. Sepsis is a medical emergency that must be treated right away in a hospital. In severe cases, it can lead to septic shock. Septic shock can weaken your heart and cause your blood pressure to drop. This can cause your central nervous system and your body's organs to stop working. What are the causes? This condition is caused by a severe reaction to infections from bacteria, viruses, or fungus. The germs that most often lead to sepsis include:  Escherichia coli (E. coli) bacteria.  Staphylococcus aureus (staph) bacteria.  Some types of Streptococcus bacteria. The most common infections affect these organs:  The lung (pneumonia).  The kidneys or bladder (urinary tract infection).  The skin (cellulitis).  The bowel, gallbladder, or pancreas. What increases the risk? You are more likely to develop this condition if:  Your body's disease-fighting system (immune system) is weakened.  You are age 34 or older.  You are female.  You had surgery or you have  been hospitalized.  You have these devices inserted into your body: ? A small, thin tube (catheter). ? IV line. ? Breathing tube. ? Drainage tube.  You are not getting enough nutrients from food (malnourished).  You have a long-term (chronic) disease, such as cancer, lung disease, kidney disease, or diabetes.  You are African American. What are the signs or symptoms? Symptoms of this condition may include:  Fever.  Chills or feeling very cold.  Confusion or anxiety.  Fatigue.  Muscle aches.  Shortness of  breath.  Nausea and vomiting.  Urinating much less than usual.  Fast heart rate (tachycardia).  Rapid breathing (hyperventilation).  Changes in skin color. Your skin may look blotchy, pale, or blue.  Cool, clammy, or sweaty skin.  Skin rash. Other symptoms depend on the source of your infection. How is this diagnosed? This condition is diagnosed based on:  Your symptoms.  Your medical history.  A physical exam. Other tests may also be done to find out the cause of the infection and how severe the sepsis is. These tests may include:  Blood tests.  Urine tests.  Swabs from other areas of your body that may have an infection. These samples may be tested (cultured) to find out what type of bacteria is causing the infection.  Chest X-ray to check for pneumonia. Other imaging tests, such as a CT scan, may also be done.  Lumbar puncture. This removes a small amount of the fluid that surrounds your brain and spinal cord. The fluid is then examined for infection.   How is this treated? This condition must be treated in a hospital. Based on the cause of your infection, you may be given an antibiotic, antiviral, or antifungal medicine. You may also receive:  Fluids through an IV.  Oxygen and breathing assistance.  Medicines to increase your blood pressure.  Kidney dialysis. This process cleans your blood if your kidneys have failed.  Surgery to remove infected tissue.  Blood transfusion if needed.  Medicine to prevent blood clots.  Nutrients to correct imbalances in basic body function (metabolism). You may: ? Receive important salts and minerals (electrolytes) through an IV. ? Have your blood sugar level adjusted. Follow these instructions at home: Medicines  Take over-the-counter and prescription medicines only as told by your health care provider.  If you were prescribed an antibiotic, antiviral, or antifungal medicine, take it as told by your health care  provider. Do not stop taking the medicine even if you start to feel better.   General instructions  If you have a catheter or other indwelling device, ask to have it removed as soon as possible.  Keep all follow-up visits as told by your health care provider. This is important. Contact a health care provider if:  You do not feel like you are getting better or regaining strength.  You are having trouble coping with your recovery.  You frequently feel tired.  You feel worse or do not seem to get better after surgery.  You think you may have an infection after surgery. Get help right away if:  You have any symptoms of sepsis.  You have difficulty breathing.  You have a rapid or skipping heartbeat.  You become confused or disoriented.  You have a high fever.  Your skin becomes blotchy, pale, or blue.  You have an infection that is getting worse or not getting better. These symptoms may represent a serious problem that is an emergency. Do not wait to see if  the symptoms will go away. Get medical help right away. Call your local emergency services (911 in the [place] Do not drive yourself to the hospital. Summary  Sepsis is a medical emergency that requires immediate treatment in a hospital.  This condition is caused by a severe reaction to infections from bacteria, viruses, or fungus.  Based on the cause of your infection, you may be given an antibiotic, antiviral, or antifungal medicine.  Treatment may also include IV fluids, breathing assistance, and kidney dialysis. This information is not intended to replace advice given to you by your health care provider. Make sure you discuss any questions you have with your health care provider. Document Revised: 08/23/2017 Document Reviewed: 08/23/2017 Elsevier Patient Education  2021 [employer].

## 2020-04-14 NOTE — Discharge Summary (Addendum)
Physician Discharge Summary  Rachel Vang QPY:[id number] DOB: 1965/12/05 DOA: 04/04/2020  PCP: Rachel Vang, No Pcp Per  Admit date: 04/04/2020 Discharge date: 04/14/2020  Time spent: 60* minutes  Recommendations for Outpatient Follow-up:  1. Follow-up cardiology as outpatient 2. Rachel Vang to be discharged to skilled nursing facility 3. Rachel Vang already received Xarelto for today, please start taking Xarelto from 04/15/2020 4. Check BMP in 1 week.   Rachel Vang started on losartan  Discharge Diagnoses:  Principal Problem:   Sepsis due to pneumonia [facility]) Active Problems:   Seizures ([place])   DVT (deep venous thrombosis) (HCC)   Type II diabetes mellitus with neurological manifestations ([place])   Essential hypertension, benign   Chronic pulmonary embolism (HCC)   Dyslipidemia associated with type 2 diabetes mellitus (HCC)   Hypernatremia   Inappropriate sinus tachycardia   Hemiplegia as late effect of cerebrovascular accident (CVA) ([place])   Hypokalemia   Prolonged QT interval   Malnutrition of moderate degree   Aspiration pneumonia (HCC)   DCM (dilated cardiomyopathy) ([place])   Pericardial effusion   Discharge Condition: Stable  Diet recommendation:  TF recommendations: jevity 1.2 @ 25 ml/hr x18 hours/day (1700-1100) to advance by 10 ml every 12 hours to reach goal rate of 55 ml/hr with 100 ml free water TID. - at goal rate, this regimen will provide 1485 kcal (89% kcal need), 62 grams protein (73% protein need), and 1054 ml free water.  - weigh Rachel Vang today.   Monitor magnesium, potassium, and phosphorus daily for at least 3 days,    Filed Weights   04/04/20 2348 04/12/20 1600  Weight: 50.8 kg 50 kg    History of present illness:  55 year old female with medical history of pulmonary embolism, DVT, diabetes mellitus type 2, epilepsy, GERD, hyperlipidemia, hypertension, nontraumatic subarachnoid hemorrhage, left hemiparesis, IBS, dysphagia nonambulatory/wheelchair mobility, residing at  skilled facility came to ED with complaints of not eating and drinking for past 1 week prior to admission.  She was admitted with sepsis due to aspiration pneumonia from pre-existing dysphagia, dehydration and hypernatremia.  She was clinically improved.  Transferred to medical bed on 04/07/2008.  She had ongoing poor oral intake, pocketing food.  Also had persistent sinus tachycardia until etiology, legs Dopplers and CT negative for DVT and PE, 2D echo showed moderate pericardial effusion without tamponade features.  Cardiology was consulted Rachel Vang agreed to PEG tube placement.  IR consulted for PEG tube placement on 04/13/2020  Hospital Course:  1. Severe sepsis-secondary to aspiration pneumonia, POA-due to pre-existing dysphagia.  Rachel Vang met with sepsis criteria due to fever, tachypnea, tachycardia with hypoxemia, leukocytosis, lactic acid 6.7.  COVID-19 PCR was negative urine streptococcal antigen was negative, urine culture grew multiple species.  She received 1 dose of ceftriaxone and Zithromax on 04/04/2020.  She was switched to IV Unasyn which was changed to Augmentin after 3 days of antibiotics.  Rachel Vang completed Augmentin therapy on 04/12/2020.  CT chest on 04/08/2020 showed dependent airspace disease in the right side suggestive of aspiration. 2. Sinus tachycardia/pericardial effusion-Rachel Vang had positive D-dimer, venous duplex of lower extremities was negative for DVT, CTA chest was negative for PE.  2D echo showed normal EF but moderate pericardial effusion without tamponade.  Cardiology was consulted, dose of metoprolol was increased to 50 mg twice a day.  Sinus tachycardia has improved. 3. Dysphagia-chronic, likely from prior subarachnoid hemorrhage.  Not sure if Rachel Vang had CVA in the past.  Speech therapy was consulted, Rachel Vang advanced to a dysphagia 2 diet.  Palliative care  was consulted, initially PEG tube was declined by Rachel Vang has in the past PEG tube until due to leakage around the tube.   Rachel Vang was noted to be pocketing food, highly inconsistent oral intake along with difficulty taking some of the oral medications.  PEG tube was inserted yesterday.  Rachel Vang to continue PEG tube feeding. 4. Dehydration/hypernatremia-secondary to poor p.o. intake, presented with serum sodium of 158, resolved after treatment hypotonic half-normal saline/D5W.  Sodium is 144 as of 04/12/2020. 5. Acute hypoxemic respiratory failure-secondary to aspiration pneumonia, resolved. 6. Seizure disorder-continue Keppra 7. History of DVT/PE-Rachel Vang takes Xarelto at home, currently on IV heparin for PEG tube placement.    Continue taking Xarelto.  Rachel Vang will be given Xarelto before discharge.  Starting Xarelto from 04/15/2020. 8. Adult failure to thrive-likely poor p.o. intake due to dysphagia.  Palliative care was consulted, plan for PEG tube placement. 9. Diarrhea-resolved 10. New LV dysfunction-echocardiogram from 04/12/2020 showed LVEF 40 to 45%, suspect tachycardia mediated also could be due to stress.  Cardiology does not recommend diuretics at this time, Rachel Vang is euvolemic.  Continue losartan, metoprolol.  [name] will be considered as outpatient.   Procedures:  PEG tube placement  Consultations:  Cardiology  Discharge Exam: Vitals:   04/13/20 2034 04/14/20 0550  BP: (!) 141/93 (!) 152/95  Pulse: 94 94  Resp: 20 20  Temp: 98.5 F (36.9 C) 97.9 F (36.6 C)  SpO2: 98% 97%    General: Appears in no acute distress Cardiovascular: S1-S2, regular Respiratory: Clear to auscultation bilaterally  Discharge Instructions   Discharge Instructions    Diet - low sodium heart healthy   Complete by: As directed    Increase activity slowly   Complete by: As directed      Allergies as of 04/14/2020   No Known Allergies     Medication List    TAKE these medications   acetaminophen 325 MG tablet Commonly known as: TYLENOL Take 650 mg by mouth every 6 (six) hours.   bethanechol 10 MG  tablet Commonly known as: URECHOLINE Take 10 mg by mouth 3 (three) times daily.   docusate sodium 100 MG capsule Commonly known as: COLACE Take 100 mg by mouth daily.   gabapentin 100 MG capsule Commonly known as: NEURONTIN Take 200 mg by mouth 2 (two) times daily.   guaiFENesin 600 MG 12 hr tablet Commonly known as: MUCINEX Take 600 mg by mouth every 6 (six) hours as needed for cough.   levETIRAcetam 100 MG/ML solution Commonly known as: KEPPRA Place 5 mLs (500 mg total) into feeding tube 2 (two) times daily. What changed:   how much to take  how to take this   losartan 25 MG tablet Commonly known as: COZAAR Take 1 tablet (25 mg total) by mouth daily.   metoprolol tartrate 25 mg/10 mL Susp Commonly known as: LOPRESSOR Take 20 mLs (50 mg total) by mouth 2 (two) times daily.   mirtazapine 15 MG tablet Commonly known as: REMERON Take 15 mg by mouth at bedtime.   NON FORMULARY Apply 1 application topically 2 (two) times daily. Hand palm protector   NUTRITIONAL SUPPLEMENT PO Take 237 mLs by mouth 2 (two) times daily. Ensure   polyethylene glycol 17 g packet Commonly known as: MIRALAX / GLYCOLAX Take 17 g by mouth daily.   PROBIOTIC DAILY PO Take 1 capsule by mouth 2 (two) times daily.   rivaroxaban 20 MG Tabs tablet Commonly known as: XARELTO Take 1 tablet (20 mg total) by mouth  at bedtime. Start taking on: April 15, 2020   rosuvastatin 5 MG tablet Commonly known as: CRESTOR Take 5 mg by mouth daily.   sertraline 50 MG tablet Commonly known as: ZOLOFT Take 50 mg by mouth daily.   traZODone 100 MG tablet Commonly known as: DESYREL Take 100 mg by mouth at bedtime.   urea 40 % Crea Commonly known as: CARMOL Apply 1 application topically daily. To both feet      No Known Allergies    The results of significant diagnostics from this hospitalization (including imaging, microbiology, ancillary and laboratory) are listed below for reference.     Significant Diagnostic Studies: CT ABDOMEN WO CONTRAST  Result Date: 04/13/2020 CLINICAL DATA:  Dysphagia. Failure to thrive. Post percutaneous gastrostomy tube placement. Evaluate appropriate positioning giving challenging anatomy demonstrated chest CT performed 04/08/2020 EXAM: CT ABDOMEN WITHOUT CONTRAST TECHNIQUE: Multidetector CT imaging of the abdomen was performed following the standard protocol without IV contrast. COMPARISON:  Chest CT-04/08/2020 FINDINGS: The lack of intravenous contrast limits the ability to evaluate solid abdominal organs. Lower chest: Limited visualization the lower thorax demonstrates minimal bibasilar slightly nodular interstitial opacities, right greater than left. Minimal subsegmental atelectasis involving the imaged portions of the bilateral lower lobes, right greater than left. No pleural effusion or pneumothorax. Cardiomegaly. Coronary artery calcifications. Trace amount of pericardial fluid, presumably physiologic. Hepatobiliary: Normal hepatic contour. Normal noncontrast appearance of the gallbladder given degree of distention. No radiopaque gallstones. No intra or extrahepatic biliary ductal dilatation. Ill-defined punctate (approximately 0.8 cm) hypo attenuating nodule within the dome of the liver (image 10, series 2), incompletely characterized and likely too small to accurately characterize though appears morphologically similar to the 08/2017 examination and thus favored to represent a hepatic cyst. No ascites. Pancreas: Normal noncontrast appearance of the pancreas. Spleen: There is a punctate granuloma adjacent to mid inferior aspect of the spleen (image 23, series 2), similar to the 2019 examination. Note is made of a small splenule about the splenic hilum. Adrenals/Urinary Tract: Suspected punctate (2 mm) nonobstructing left-sided renal stone (image 33, series 2). No evidence of right-sided nephrolithiasis. No urinary obstruction or perinephric stranding Normal  noncontrast appearance the bilateral adrenal glands. The urinary bladder was not imaged. Stomach/Bowel: Appropriately positioned pull-through gastrostomy tube with internal-disc appropriately positioned adjacent to the inner wall the gastric lumen. There is no evidence injury to either the liver parenchyma, transverse or small bowel though the splenic flexure of the transverse colon extends superior to the entrance site of the gastrostomy tube, however the gastrostomy tube does NOT extend through the colonic mesentery. Interval progression marked distension of the several loops of mid small bowel with index loop of small bowel within the right mid hemiabdomen measuring approximately 5.4 cm in diameter (image 32, series 2). Enteric contrast, administered during the time of gastrostomy tube placement extends to the proximal jejunum. Vascular/Lymphatic: Scattered atherosclerotic plaque within normal caliber abdominal aorta. No bulky retroperitoneal or mesenteric lymphadenopathy on this noncontrast examination. Other: Expected minimal amount of subcutaneous stranding about the gastrostomy tube insertion site which is adjacent to the tract of the previous gastrostomy tube. Musculoskeletal: No acute or aggressive osseous abnormalities. IMPRESSION: 1. Post uncomplicated placement of pull-through percutaneous gastrostomy tube. 2. Marked distension of several loops, incompletely evaluated as the site of obstruction was imaged on present examination. Clinical correlation is advised. Further evaluation with complete CT scan the abdomen and pelvis could be performed indicated. 3. Solitary punctate (2 mm) left-sided nonobstructing renal stone. 4.  Aortic Atherosclerosis (ICD10-I70.0).  Electronically Signed   By: Sandi Mariscal M.D.   On: 04/13/2020 15:46   CT ANGIO CHEST PE W OR WO CONTRAST  Result Date: 04/08/2020 CLINICAL DATA:  Right upper chest pain, positive D-dimer, history of deep venous thrombosis EXAM: CT ANGIOGRAPHY  CHEST WITH CONTRAST TECHNIQUE: Multidetector CT imaging of the chest was performed using the standard protocol during bolus administration of intravenous contrast. Multiplanar CT image reconstructions and MIPs were obtained to evaluate the vascular anatomy. CONTRAST:  73m OMNIPAQUE IOHEXOL 350 MG/ML SOLN COMPARISON:  04/04/2020, 05/06/2017 FINDINGS: Cardiovascular: This is a technically adequate evaluation of the pulmonary vasculature. No filling defects or pulmonary emboli. Trace pericardial fluid. The heart is not enlarged. Normal caliber of the thoracic aorta without dissection. Minimal atherosclerosis. Mediastinum/Nodes: No enlarged mediastinal, hilar, or axillary lymph nodes. Thyroid gland, trachea, and esophagus demonstrate no significant findings. Lungs/Pleura: Dependent areas of airspace disease are seen within the right upper and right lower lobes, corresponding to abnormality seen on recent chest x-ray. Findings could reflect infection or aspiration. No effusion or pneumothorax. Central airways are patent. Upper Abdomen: No acute abnormality. Musculoskeletal: No acute or destructive bony lesions. Reconstructed images demonstrate no additional findings. Review of the MIP images confirms the above findings. IMPRESSION: 1. No evidence of pulmonary embolus. 2. Dependent airspace disease within the right lung, consistent with infection or aspiration. 3.  Aortic Atherosclerosis (ICD10-I70.0). Electronically Signed   By: MRanda NgoM.D.   On: 04/08/2020 21:31   IR GASTROSTOMY TUBE MOD SED  Result Date: 04/13/2020 INDICATION: Previous history chronic feeding gastrostomy tube now with recurrent dysphagia and failure to thrive. Please image guided gastrostomy placement or enteric nutrition supplementation purposes. EXAM: PULL TROUGH GASTROSTOMY TUBE PLACEMENT COMPARISON:  Chest CT - 04/08/2020; fluoroscopic guided gastrostomy tube exchange-06/08/2016 MEDICATIONS: Rachel Vang currently admitted to the hospital  receiving intravenous antibiotics; Antibiotics were administered within 1 hour of the procedure. Glucagon 1 mg IV CONTRAST:  25 mL of Omnipaque 300 administered into the gastric lumen. ANESTHESIA/SEDATION: Moderate (conscious) sedation was employed during this procedure. A total of Versed 2 mg and Fentanyl 100 mcg was administered intravenously. Moderate Sedation Time: 19 minutes. The Rachel Vang's level of consciousness and vital signs were monitored continuously by radiology nursing throughout the procedure under my direct supervision. FLUOROSCOPY TIME:  5 minutes, 54 seconds (54 mGy) COMPLICATIONS: None immediate. PROCEDURE: Informed written consent was obtained from the Rachel Vang's family following explanation of the procedure, risks, benefits and alternatives. A time out was performed prior to the initiation of the procedure. Ultrasound scanning was performed to demarcate the edge of the left lobe of the liver. Maximal barrier sterile technique utilized including caps, mask, sterile gowns, sterile gloves, large sterile drape, hand hygiene and Betadine prep. The left upper quadrant was sterilely prepped and draped. An oral gastric catheter was inserted into the stomach under fluoroscopy. The existing nasogastric feeding tube was removed. The left costal margin and air opacified transverse colon were identified and avoided. Air was injected into the stomach for insufflation and visualization under fluoroscopy. Under sterile conditions a 17 gauge trocar needle was utilized to access the stomach percutaneously beneath the left subcostal margin after the overlying soft tissues were anesthetized with 1% Lidocaine with epinephrine. Needle position was confirmed within the stomach with aspiration of air and injection of small amount of contrast. A single T tack was deployed for gastropexy. Over an Amplatz guide wire, a 9-French sheath was inserted into the stomach. A snare device was utilized to capture the oral gastric  catheter. The snare device was pulled retrograde from the stomach up the esophagus and out the oropharynx. The 20-French pull-through gastrostomy was connected to the snare device and pulled antegrade through the oropharynx down the esophagus into the stomach and then through the percutaneous tract external to the Rachel Vang. The gastrostomy was assembled externally. Contrast injection confirms position in the stomach. Several spot radiographic images were obtained in various obliquities for documentation. The Rachel Vang tolerated procedure well without immediate post procedural complication. FINDINGS: After successful fluoroscopic guided placement, the gastrostomy tube is appropriately positioned with internal disc against the ventral aspect of the gastric lumen. IMPRESSION: Successful fluoroscopic insertion of a 20-French pull-through gastrostomy tube. The gastrostomy may be used immediately for medication administration and in 24 hrs for the initiation of feeds. Note, given marked distension of multiple loops of large and small bowel and associated suboptimal percutaneous window, a preprocedural noncontrast abdominal CT was obtained confirming appropriate positioning of the gastrostomy tube. Electronically Signed   By: Sandi Mariscal M.D.   On: 04/13/2020 14:10   DG Chest Portable 1 View  Result Date: 04/04/2020 CLINICAL DATA:  Hypoxia. EXAM: PORTABLE CHEST 1 VIEW COMPARISON:  Radiograph 10/07/2017 FINDINGS: New volume loss in the right hemithorax. Vague right perihilar opacity. Left lung is clear. The heart is normal in size. Aortic tortuosity versus rotation secondary to right lung volume loss. No pneumothorax or large pleural effusion. The bones are subjectively under mineralized. IMPRESSION: New volume loss in the right hemithorax with vague right perihilar opacity, findings suspicious for aspiration. Possibility of mucous plugging is also considered. Electronically Signed   By: Keith Rake M.D.   On:  04/04/2020 17:35   ECHOCARDIOGRAM COMPLETE  Result Date: 04/08/2020    ECHOCARDIOGRAM REPORT   Rachel Vang Name:   KYLER LERETTE Date of Exam: 04/08/2020 Medical Rec #:  [id number]          Height:       66.0 in Accession #:    [id number]         Weight:       112.0 lb Date of Birth:  1965/06/28           BSA:          1.563 m Rachel Vang Age:    56 years           BP:           112/80 mmHg Rachel Vang Gender: F                  HR:           109 bpm. Exam Location:  Inpatient Procedure: 2D Echo, Cardiac Doppler and Color Doppler Indications:    R94.31 Abnormal EKG  History:        Rachel Vang has prior history of Echocardiogram examinations, most                 recent 05/08/2017. Risk Factors:Hypertension, Diabetes and                 Dyslipidemia. Pulmonary Embolism. GERD. Seizures.  Sonographer:    Tiffany Dance Referring Phys: [id number] ANAND D HONGALGI  Sonographer Comments: No subcostal window and Technically difficult study due to poor echo windows. IMPRESSIONS  1. Moderate pericardial effusion. The pericardial effusion is anterior to the right ventricle. Mild RV diastolic compression without RV diastolic collapse. Unable to assess IVC or hepatic veins. Respirometer not performed. Study is suboptimal for assessment of constricitive physiology. Tachycardia noted. Consider repeat echo  in 2-3 days or with clinical deterioration.  2. Left ventricular ejection fraction, by estimation, is 60 to 65%. The left ventricle has normal function. Left ventricular endocardial border not optimally defined to evaluate regional wall motion. Indeterminate diastolic filling due to E-A fusion.  3. Right ventricular systolic function is normal. The right ventricular size is normal. Tricuspid regurgitation signal is inadequate for assessing PA pressure.  4. The mitral valve is normal in structure. No evidence of mitral valve regurgitation. No evidence of mitral stenosis.  5. The aortic valve was not well visualized. Aortic valve regurgitation is  not visualized. No aortic stenosis is present. FINDINGS  Left Ventricle: Left ventricular ejection fraction, by estimation, is 60 to 65%. The left ventricle has normal function. Left ventricular endocardial border not optimally defined to evaluate regional wall motion. The left ventricular internal cavity size was normal in size. There is no left ventricular hypertrophy. Indeterminate diastolic filling due to E-A fusion. Right Ventricle: The right ventricular size is normal. No increase in right ventricular wall thickness. Right ventricular systolic function is normal. Tricuspid regurgitation signal is inadequate for assessing PA pressure. Left Atrium: Left atrial size was normal in size. Right Atrium: Right atrial size was normal in size. Pericardium: A moderately sized pericardial effusion is present. The pericardial effusion is anterior to the right ventricle. Presence of pericardial fat pad. Mitral Valve: The mitral valve is normal in structure. No evidence of mitral valve regurgitation. No evidence of mitral valve stenosis. Tricuspid Valve: The tricuspid valve is normal in structure. Tricuspid valve regurgitation is trivial. No evidence of tricuspid stenosis. Aortic Valve: The aortic valve was not well visualized. Aortic valve regurgitation is not visualized. No aortic stenosis is present. Pulmonic Valve: The pulmonic valve was not well visualized. Pulmonic valve regurgitation is not visualized. No evidence of pulmonic stenosis. Aorta: The aortic root is normal in size and structure. Venous: The inferior vena cava was not well visualized. IAS/Shunts: The interatrial septum was not well visualized.  LEFT VENTRICLE PLAX 2D LVIDd:         4.18 cm  Diastology LVIDs:         3.41 cm  LV e' medial:    7.21 cm/s LV PW:         1.11 cm  LV E/e' medial:  11.2 LV IVS:        0.94 cm  LV e' lateral:   5.72 cm/s LVOT diam:     2.20 cm  LV E/e' lateral: 14.2 LV SV:         34 LV SV Index:   22 LVOT Area:     3.80 cm  RIGHT  VENTRICLE RV Basal diam:  1.71 cm RV S prime:     12.10 cm/s TAPSE (M-mode): 1.9 cm LEFT ATRIUM         Index LA diam:    2.90 cm 1.86 cm/m  AORTIC VALVE LVOT Vmax:   57.80 cm/s LVOT Vmean:  37.200 cm/s LVOT VTI:    0.089 m  AORTA Ao Root diam: 3.30 cm MITRAL VALVE MV Area (PHT): 3.42 cm    SHUNTS MV Decel Time: 222 msec    Systemic VTI:  0.09 m MV E velocity: 81.10 cm/s  Systemic Diam: 2.20 cm Cherlynn Kaiser MD Electronically signed by Cherlynn Kaiser MD Signature Date/Time: 04/08/2020/3:48:08 PM    Final    VAS [place] LOWER EXTREMITY VENOUS (DVT)  Result Date: 04/09/2020  Lower Venous DVT Study Indications: Tachycardia.  Risk Factors: None identified.  Limitations: Poor ultrasound/tissue interface and Rachel Vang positioning, Rachel Vang immobility, Rachel Vang pain tolerance, severe leg contractures. Comparison Study: No prior studies. Performing Technologist: Oliver Hum RVT  Examination Guidelines: A complete evaluation includes B-mode imaging, spectral Doppler, color Doppler, and power Doppler as needed of all accessible portions of each vessel. Bilateral testing is considered an integral part of a complete examination. Limited examinations for reoccurring indications may be performed as noted. The reflux portion of the exam is performed with the Rachel Vang in reverse Trendelenburg.  +---------+---------------+---------+-----------+----------+-------------------+ RIGHT    CompressibilityPhasicitySpontaneityPropertiesThrombus Aging      +---------+---------------+---------+-----------+----------+-------------------+ CFV      Full           Yes      Yes                                      +---------+---------------+---------+-----------+----------+-------------------+ SFJ      Full                                                             +---------+---------------+---------+-----------+----------+-------------------+ FV Prox  Full                                                              +---------+---------------+---------+-----------+----------+-------------------+ FV Mid   Full                                                             +---------+---------------+---------+-----------+----------+-------------------+ FV DistalFull                                                             +---------+---------------+---------+-----------+----------+-------------------+ PFV                                                   Not well visualized +---------+---------------+---------+-----------+----------+-------------------+ POP      Full           No       No                                       +---------+---------------+---------+-----------+----------+-------------------+ PTV      Full                                                             +---------+---------------+---------+-----------+----------+-------------------+  PERO     Full                                                             +---------+---------------+---------+-----------+----------+-------------------+   +---------+---------------+---------+-----------+----------+-------------------+ LEFT     CompressibilityPhasicitySpontaneityPropertiesThrombus Aging      +---------+---------------+---------+-----------+----------+-------------------+ CFV      Full           Yes      Yes                                      +---------+---------------+---------+-----------+----------+-------------------+ SFJ      Full                                                             +---------+---------------+---------+-----------+----------+-------------------+ FV Prox  Full                                                             +---------+---------------+---------+-----------+----------+-------------------+ FV Mid   Full                                                             +---------+---------------+---------+-----------+----------+-------------------+ FV  DistalFull                                                             +---------+---------------+---------+-----------+----------+-------------------+ PFV                                                   Not well visualized +---------+---------------+---------+-----------+----------+-------------------+ POP                     No       No                                       +---------+---------------+---------+-----------+----------+-------------------+ PTV      Full                                                             +---------+---------------+---------+-----------+----------+-------------------+ PERO  Full                                                             +---------+---------------+---------+-----------+----------+-------------------+     Summary: RIGHT: - There is no evidence of deep vein thrombosis in the lower extremity. However, portions of this examination were limited- see technologist comments above.  - No cystic structure found in the popliteal fossa.  LEFT: - There is no evidence of deep vein thrombosis in the lower extremity. However, portions of this examination were limited- see technologist comments above.  - No cystic structure found in the popliteal fossa.  *See table(s) above for measurements and observations. Electronically signed by Harold Barban MD on 04/09/2020 at 4:18:36 PM.    Final    ECHOCARDIOGRAM LIMITED  Result Date: 04/12/2020    ECHOCARDIOGRAM LIMITED REPORT   Rachel Vang Name:   LATRAVIA SOUTHGATE Date of Exam: 04/12/2020 Medical Rec #:  [id number]          Height:       66.0 in Accession #:    [id number]         Weight:       112.0 lb Date of Birth:  1965/09/17           BSA:          1.563 m Rachel Vang Age:    78 years           BP:           132/79 mmHg Rachel Vang Gender: F                  HR:           88 bpm. Exam Location:  Inpatient Procedure: Limited Echo, Cardiac Doppler and Color Doppler STAT ECHO Indications:    I31.3  Pericardial effusion (noninflammatory)  History:        Rachel Vang has prior history of Echocardiogram examinations, most                 recent 04/08/2020. Abnormal ECG, Stroke; Risk Factors:Diabetes.                 Pericardial effusion. Seizure. Pulmonary embolus.  Sonographer:    Roseanna Rainbow RDCS Referring Phys: [place]  Sonographer Comments: Technically difficult study due to poor echo windows. IMPRESSIONS  1. Left ventricular ejection fraction, by estimation, is 40 to 45%. The left ventricle has mildly decreased function. The left ventricle demonstrates global hypokinesis.  2. Right ventricular systolic function is normal. The right ventricular size is normal.  3. A small pericardial effusion is present. The pericardial effusion is circumferential. There is no evidence of cardiac tamponade.  4. The mitral valve is normal in structure. No evidence of mitral valve regurgitation. No evidence of mitral stenosis.  5. The aortic valve is normal in structure. Aortic valve regurgitation is not visualized. No aortic stenosis is present.  6. The inferior vena cava is normal in size with greater than 50% respiratory variability, suggesting right atrial pressure of 3 mmHg. Comparison(s): Prior images reviewed side by side. Changes from prior study are noted. The left ventricular function is worsened. The pericardial effusion is smaller. FINDINGS  Left Ventricle: Left ventricular ejection fraction, by estimation, is 40 to 45%. The left ventricle has mildly  decreased function. The left ventricle demonstrates global hypokinesis. The left ventricular internal cavity size was normal in size. There is  no left ventricular hypertrophy. Right Ventricle: The right ventricular size is normal. No increase in right ventricular wall thickness. Right ventricular systolic function is normal. Left Atrium: Left atrial size was normal in size. Right Atrium: Right atrial size was normal in size. Pericardium: A small pericardial  effusion is present. The pericardial effusion is circumferential. There is no evidence of cardiac tamponade. Mitral Valve: The mitral valve is normal in structure. No evidence of mitral valve stenosis. Tricuspid Valve: The tricuspid valve is normal in structure. Tricuspid valve regurgitation is not demonstrated. No evidence of tricuspid stenosis. Aortic Valve: The aortic valve is normal in structure. Aortic valve regurgitation is not visualized. No aortic stenosis is present. Pulmonic Valve: The pulmonic valve was normal in structure. Pulmonic valve regurgitation is not visualized. No evidence of pulmonic stenosis. Aorta: The aortic root is normal in size and structure. Venous: The inferior vena cava is normal in size with greater than 50% respiratory variability, suggesting right atrial pressure of 3 mmHg. IAS/Shunts: No atrial level shunt detected by color flow Doppler. LEFT VENTRICLE PLAX 2D LVIDd:         4.16 cm LVIDs:         3.39 cm LV PW:         1.48 cm LV IVS:        1.03 cm  LV Volumes (MOD) LV vol d, MOD A2C: 43.4 ml LV vol d, MOD A4C: 72.0 ml LV vol s, MOD A2C: 23.1 ml LV vol s, MOD A4C: 35.3 ml LV SV MOD A2C:     20.3 ml LV SV MOD A4C:     72.0 ml LV SV MOD BP:      27.8 ml IVC IVC diam: 0.80 cm LEFT ATRIUM         Index LA diam:    3.40 cm 2.18 cm/m   AORTA Ao Root diam: 3.10 cm Ao Asc diam:  3.00 cm Mihai Croitoru MD Electronically signed by Sanda Klein MD Signature Date/Time: 04/12/2020/12:14:41 PM    Final     Microbiology: Recent Results (from the past 240 hour(s))  Blood culture (routine x 2)     Status: None   Collection Time: 04/04/20  5:25 PM   Specimen: BLOOD RIGHT FOREARM  Result Value Ref Range Status   Specimen Description   Final    BLOOD RIGHT FOREARM Performed at [facility], [place] [address], [place], [place] [address]    Special Requests   Final    BACTERIAL CASTS Blood Culture adequate volume Performed at [place]  [address] [place], [place] [address]    Culture   Final    NO GROWTH 5 DAYS Performed at [facility], [place] [address] [place], [place] [address]    Report Status 04/09/2020 FINAL  Final  Blood culture (routine x 2)     Status: None   Collection Time: 04/04/20  5:25 PM   Specimen: BLOOD LEFT FOREARM  Result Value Ref Range Status   Specimen Description   Final    BLOOD LEFT FOREARM Performed at [place] [address], [place], [place] [address]    Special Requests   Final    BLOOD Blood Culture results may not be optimal due to an inadequate volume of blood received in culture bottles Performed at [facility],  [place] [address], [place], [place] [address]    Culture   Final    NO GROWTH 5 DAYS Performed at [facility], [place] [address] [place], [place] [address]    Report Status 04/09/2020 FINAL  Final  Resp Panel by RT-PCR (Flu A&B, Covid) Nasopharyngeal Swab     Status: None   Collection Time: 04/04/20  5:25 PM   Specimen: Nasopharyngeal Swab; Nasopharyngeal(NP) swabs in vial transport medium  Result Value Ref Range Status   SARS Coronavirus 2 by RT PCR NEGATIVE NEGATIVE Final    Comment: (NOTE) SARS-CoV-2 target nucleic acids are NOT DETECTED.  The SARS-CoV-2 RNA is generally detectable in upper respiratory specimens during the acute phase of infection. The lowest concentration of SARS-CoV-2 viral copies this assay can detect is 138 copies/mL. A negative result does not preclude SARS-Cov-2 infection and should not be used as the sole basis for treatment or other Rachel Vang management decisions. A negative result may occur with  improper specimen collection/handling, submission of specimen other than nasopharyngeal swab, presence of viral mutation(s) within the areas targeted by this assay, and inadequate number of viral copies(<138 copies/mL). A negative result must be combined with clinical observations, Rachel Vang history,  and epidemiological information. The expected result is Negative.  Fact Sheet for Patients:  EntrepreneurPulse.com.au  Fact Sheet for Healthcare Providers:  IncredibleEmployment.be  This test is no t yet approved or cleared by the [place] FDA and  has been authorized for detection and/or diagnosis of SARS-CoV-2 by FDA under an Emergency Use Authorization (EUA). This EUA will remain  in effect (meaning this test can be used) for the duration of the COVID-19 declaration under Section 564(b)(1) of the Act, 21 U.S.C.section 360bbb-3(b)(1), unless the authorization is terminated  or revoked sooner.       Influenza A by PCR NEGATIVE NEGATIVE Final   Influenza B by PCR NEGATIVE NEGATIVE Final    Comment: (NOTE) The Xpert Xpress SARS-CoV-2/FLU/RSV plus assay is intended as an aid in the diagnosis of influenza from Nasopharyngeal swab specimens and should not be used as a sole basis for treatment. Nasal washings and aspirates are unacceptable for Xpert Xpress SARS-CoV-2/FLU/RSV testing.  Fact Sheet for Patients: EntrepreneurPulse.com.au  Fact Sheet for Healthcare Providers: IncredibleEmployment.be  This test is not yet approved or cleared by the [place] FDA and has been authorized for detection and/or diagnosis of SARS-CoV-2 by FDA under an Emergency Use Authorization (EUA). This EUA will remain in effect (meaning this test can be used) for the duration of the COVID-19 declaration under Section 564(b)(1) of the Act, 21 U.S.C. section 360bbb-3(b)(1), unless the authorization is terminated or revoked.  Performed at [facility], [place] [address] [place], [place] [address]   Urine culture     Status: Abnormal   Collection Time: 04/04/20 10:09 PM   Specimen: In/Out Cath Urine  Result Value Ref Range Status   Specimen Description   Final    IN/OUT CATH URINE Performed at [place] [address], [place], [place] [address]    Special Requests   Final    NONE Performed at [facility], [place] [address], [place], [place] [address]    Culture MULTIPLE SPECIES PRESENT, SUGGEST RECOLLECTION (A)  Final   Report Status 04/06/2020 FINAL  Final  MRSA PCR Screening     Status: None   Collection Time: 04/04/20 11:20 PM   Specimen: Nasopharyngeal  Result Value Ref Range Status   MRSA by  PCR NEGATIVE NEGATIVE Final    Comment:        The GeneXpert MRSA Assay (FDA approved for NASAL specimens only), is one component of a comprehensive MRSA colonization surveillance program. It is not intended to diagnose MRSA infection nor to guide or monitor treatment for MRSA infections. Performed at [facility], [place] [address], [place], [place] [address]   SARS CORONAVIRUS 2 (TAT 6-24 HRS) Nasopharyngeal Nasopharyngeal Swab     Status: None   Collection Time: 04/09/20  6:15 AM   Specimen: Nasopharyngeal Swab  Result Value Ref Range Status   SARS Coronavirus 2 NEGATIVE NEGATIVE Final    Comment: (NOTE) SARS-CoV-2 target nucleic acids are NOT DETECTED.  The SARS-CoV-2 RNA is generally detectable in upper and lower respiratory specimens during the acute phase of infection. Negative results do not preclude SARS-CoV-2 infection, do not rule out co-infections with other pathogens, and should not be used as the sole basis for treatment or other Rachel Vang management decisions. Negative results must be combined with clinical observations, Rachel Vang history, and epidemiological information. The expected result is Negative.  Fact Sheet for Patients: SugarRoll.be  Fact Sheet for Healthcare Providers: https://www.woods-mathews.com/  This test is not yet approved or cleared by the [place] FDA and  has been authorized for detection and/or diagnosis of SARS-CoV-2 by FDA under an  Emergency Use Authorization (EUA). This EUA will remain  in effect (meaning this test can be used) for the duration of the COVID-19 declaration under Se ction 564(b)(1) of the Act, 21 U.S.C. section 360bbb-3(b)(1), unless the authorization is terminated or revoked sooner.  Performed at [facility], [place] [address] [place], [place] [address]      Labs: Basic Metabolic Panel: Recent Labs  Lab 04/09/20 0522 04/10/20 0535 04/11/20 0456 04/12/20 0111 04/14/20 0531  NA 143 143 144 144 139  K 3.3* 3.0* 3.9 3.5 3.3*  CL 110 109 110 111 107  CO2 24 26 25 24 23   GLUCOSE 114* 121* 118* 108* 103*  BUN <5* <5* <5* <5* <5*  CREATININE 0.51 0.58 0.55 0.49 0.49  CALCIUM 8.4* 8.7* 9.1 8.7* 8.6*  MG  --  1.8  --  2.0  --    Liver Function Tests: Recent Labs  Lab 04/08/20 0532  AST 16  ALT 13  ALKPHOS 107  BILITOT 0.8  PROT 6.6  ALBUMIN 2.7*   No results for input(s): LIPASE, AMYLASE in the last 168 hours. No results for input(s): AMMONIA in the last 168 hours. CBC: Recent Labs  Lab 04/10/20 0535 04/11/20 0456 04/12/20 0111 04/13/20 0508 04/14/20 0531  WBC 7.4 6.7 8.7 8.6 8.6  HGB 9.2* 9.2* 8.4* 9.2* 10.0*  HCT 29.2* 30.3* 27.6* 29.6* 32.2*  MCV 99.0 100.0 103.0* 100.0 100.9*  PLT 250 279 353 394 425*   Cardiac Enzymes: No results for input(s): CKTOTAL, CKMB, CKMBINDEX, TROPONINI in the last 168 hours. BNP: BNP (last 3 results) No results for input(s): BNP in the last 8760 hours.  ProBNP (last 3 results) No results for input(s): PROBNP in the last 8760 hours.  CBG: Recent Labs  Lab 04/13/20 0618 04/13/20 1232 04/13/20 1723 04/13/20 2345 04/14/20 0548  GLUCAP 89 139* 97 130* 96       Signed:  Oswald Hillock MD.  [facility] 04/14/2020, 11:54 AM

## 2020-04-14 NOTE — Progress Notes (Signed)
ANTICOAGULATION CONSULT NOTE  Pharmacy Consult for IV heparin Indication: hx pulmonary embolus and DVT  No Known Allergies  Patient Measurements: Height: 5\' 6"  (167.6 cm) Weight: 50 kg (110 lb 3.2 oz) IBW/kg (Calculated) : 59.3 Heparin Dosing Weight: actual body weight   Vital Signs: Temp: 97.9 F (36.6 C) (03/17 0550) Temp Source: Oral (03/17 0550) BP: 152/95 (03/17 0550) Pulse Rate: 94 (03/17 0550)  Labs: Recent Labs    04/11/20 1756 04/11/20 1756 04/12/20 0111 04/12/20 0936 04/12/20 1536 04/13/20 0508 04/14/20 0531  HGB  --    < > 8.4*  --   --  9.2* 10.0*  HCT  --   --  27.6*  --   --  29.6* 32.2*  PLT  --   --  353  --   --  394 425*  APTT 34  --  57*  --   --   --   --   LABPROT  --   --   --   --   --  14.8  --   INR  --   --   --   --   --  1.2  --   HEPARINUNFRC 0.14*  --  0.23* 0.36 0.53  --  0.38  CREATININE  --   --  0.49  --   --   --   --    < > = values in this interval not displayed.    Estimated Creatinine Clearance: 62 mL/min (by C-G formula based on SCr of 0.49 mg/dL).   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 Xarelto resumed on admission.  Plan is for PEG placement on 3/16 at 10am and IR recommends to hold xarelto at least 24 hrs prior to procedure. Pharmacy has been consulted to bridge with heparin drip while xarelto is on hold.  - last dose of xarelto taken at [facility] on 3/13.  Baseline heparin level & APTT correlate; therefore, will use heparin level for dosing.    Heparin stopped 3/16 0600-2000 for IR procedure.   04/14/2020:  Heparin level = 0.38 units/mL, therapeutic   CBC: Hgb 10-low but improved, Pltc WNL  No bleeding or infusion related concerns per RN  Goal of Therapy:  Heparin level 0.3-0.7 units/ml Monitor platelets by anticoagulation protocol: Yes    Plan:   Continue Heparin infusion at 950 units/hr   Daily heparin level & CBC while on heparin  Follow up resumption of Xarelto  4/16 PharmD 04/14/2020, 6:34 AM

## 2020-04-14 NOTE — Progress Notes (Signed)
Progress Note  Patient Name: Rachel Vang Date of Encounter: 04/14/2020  Primary Cardiologist: Chilton Si, MD  Subjective   Denies any CP, SOB. Lying flat in bed. Denies family hx of CAD.  Inpatient Medications    Scheduled Meds: . bethanechol  10 mg Oral TID  . Chlorhexidine Gluconate Cloth  6 each Topical Daily  . feeding supplement  237 mL Oral TID BM  . gabapentin  200 mg Oral Q12H  . levETIRAcetam  750 mg Oral BID  . mouth rinse  15 mL Mouth Rinse BID  . metoprolol tartrate  50 mg Oral BID  . mirtazapine  15 mg Oral QHS  . rosuvastatin  5 mg Oral Daily  . sertraline  50 mg Oral Daily  . traZODone  100 mg Oral QHS   Continuous Infusions: . sodium chloride 75 mL/hr at 04/14/20 0214  . heparin 950 Units/hr (04/14/20 0446)   PRN Meds: acetaminophen **OR** acetaminophen, morphine injection, prochlorperazine   Vital Signs    Vitals:   04/13/20 1145 04/13/20 1408 04/13/20 2034 04/14/20 0550  BP: (!) 126/98 116/88 (!) 141/93 (!) 152/95  Pulse: (!) 104 97 94 94  Resp: Temp:  99.1 F (37.3 C) 98.5 F (36.9 C) 97.9 F (36.6 C)  TempSrc:  Oral Oral Oral  SpO2: 98% 100% 98% 97%  Weight:      Height:        Intake/Output Summary (Last 24 hours) at 04/14/2020 1104 Last data filed at 04/14/2020 1030 Gross per 24 hour  Intake 2942.64 ml  Output 800 ml  Net 2142.64 ml   Last 3 Weights 04/12/2020 04/04/2020 04/04/2019  Weight (lbs) 110 lb 3.2 oz 111 lb 15.9 oz 112 lb  Weight (kg) 49.986 kg 50.8 kg 50.803 kg     Telemetry    NSR, Rare PVC - Personally Reviewed  Physical Exam   GEN: No acute distress, chronically ill/frail appearing HEENT: Normocephalic, atraumatic, sclera non-icteric. Neck: No JVD or bruits. Cardiac: RRR no murmurs, rubs, or gallops.  Respiratory: Clear to auscultation bilaterally. Breathing is unlabored. GI: Soft, nontender, non-distended, BS +x 4. PEG tube MS: no deformity. Extremities: No clubbing or cyanosis. No  edema. Distal pedal pulses are 2+ and equal bilaterally. Neuro:  AAOx3 except didn't know date. Follows commands. Psych:  Responds to questions appropriately with a normal affect.  Labs    High Sensitivity Troponin:  No results for input(s): TROPONINIHS in the last 720 hours.    Cardiac EnzymesNo results for input(s): TROPONINI in the last 168 hours. No results for input(s): TROPIPOC in the last 168 hours.   Chemistry Recent Labs  Lab 04/08/20 0532 04/09/20 0522 04/11/20 0456 04/12/20 0111 04/14/20 0531  NA 140   < > 144 144 139  K 3.9   < > 3.9 3.5 3.3*  CL 107   < > 110 111 107  CO2 23   < > GLUCOSE 106*   < > 118* 108* 103*  BUN <5*   < > <5* <5* <5*  CREATININE 0.53   < > 0.55 0.49 0.49  CALCIUM 8.7*   < > 9.1 8.7* 8.6*  PROT 6.6  --   --   --   --   ALBUMIN 2.7*  --   --   --   --   AST 16  --   --   --   --   ALT 13  --   --   --   --  ALKPHOS 107  --   --   --   --   BILITOT 0.8  --   --   --   --   GFRNONAA >60   < > >60 >60 >60  ANIONGAP 10   < > 9 9 9    < > = values in this interval not displayed.     Hematology Recent Labs  Lab 04/12/20 0111 04/13/20 0508 04/14/20 0531  WBC 8.7 8.6 8.6  RBC 2.68* 2.96* 3.19*  HGB 8.4* 9.2* 10.0*  HCT 27.6* 29.6* 32.2*  MCV 103.0* 100.0 100.9*  MCH 31.3 31.1 31.3  MCHC 30.4 31.1 31.1  RDW 16.3* 16.3* 16.3*  PLT 353 394 425*    BNPNo results for input(s): BNP, PROBNP in the last 168 hours.   DDimer  Recent Labs  Lab 04/08/20 1120  DDIMER 0.64*     Radiology    CT ABDOMEN WO CONTRAST  Result Date: 04/13/2020 CLINICAL DATA:  Dysphagia. Failure to thrive. Post percutaneous gastrostomy tube placement. Evaluate appropriate positioning giving challenging anatomy demonstrated chest CT performed 04/08/2020 EXAM: CT ABDOMEN WITHOUT CONTRAST TECHNIQUE: Multidetector CT imaging of the abdomen was performed following the standard protocol without IV contrast. COMPARISON:  Chest CT-04/08/2020 FINDINGS: The  lack of intravenous contrast limits the ability to evaluate solid abdominal organs. Lower chest: Limited visualization the lower thorax demonstrates minimal bibasilar slightly nodular interstitial opacities, right greater than left. Minimal subsegmental atelectasis involving the imaged portions of the bilateral lower lobes, right greater than left. No pleural effusion or pneumothorax. Cardiomegaly. Coronary artery calcifications. Trace amount of pericardial fluid, presumably physiologic. Hepatobiliary: Normal hepatic contour. Normal noncontrast appearance of the gallbladder given degree of distention. No radiopaque gallstones. No intra or extrahepatic biliary ductal dilatation. Ill-defined punctate (approximately 0.8 cm) hypo attenuating nodule within the dome of the liver (image 10, series 2), incompletely characterized and likely too small to accurately characterize though appears morphologically similar to the 08/2017 examination and thus favored to represent a hepatic cyst. No ascites. Pancreas: Normal noncontrast appearance of the pancreas. Spleen: There is a punctate granuloma adjacent to mid inferior aspect of the spleen (image 23, series 2), similar to the 2019 examination. Note is made of a small splenule about the splenic hilum. Adrenals/Urinary Tract: Suspected punctate (2 mm) nonobstructing left-sided renal stone (image 33, series 2). No evidence of right-sided nephrolithiasis. No urinary obstruction or perinephric stranding Normal noncontrast appearance the bilateral adrenal glands. The urinary bladder was not imaged. Stomach/Bowel: Appropriately positioned pull-through gastrostomy tube with internal-disc appropriately positioned adjacent to the inner wall the gastric lumen. There is no evidence injury to either the liver parenchyma, transverse or small bowel though the splenic flexure of the transverse colon extends superior to the entrance site of the gastrostomy tube, however the gastrostomy tube  does NOT extend through the colonic mesentery. Interval progression marked distension of the several loops of mid small bowel with index loop of small bowel within the right mid hemiabdomen measuring approximately 5.4 cm in diameter (image 32, series 2). Enteric contrast, administered during the time of gastrostomy tube placement extends to the proximal jejunum. Vascular/Lymphatic: Scattered atherosclerotic plaque within normal caliber abdominal aorta. No bulky retroperitoneal or mesenteric lymphadenopathy on this noncontrast examination. Other: Expected minimal amount of subcutaneous stranding about the gastrostomy tube insertion site which is adjacent to the tract of the previous gastrostomy tube. Musculoskeletal: No acute or aggressive osseous abnormalities. IMPRESSION: 1. Post uncomplicated placement of pull-through percutaneous gastrostomy tube. 2. Marked distension of several loops,  incompletely evaluated as the site of obstruction was imaged on present examination. Clinical correlation is advised. Further evaluation with complete CT scan the abdomen and pelvis could be performed indicated. 3. Solitary punctate (2 mm) left-sided nonobstructing renal stone. 4.  Aortic Atherosclerosis (ICD10-I70.0). Electronically Signed   By: Simonne Come M.D.   On: 04/13/2020 15:46   IR GASTROSTOMY TUBE MOD SED  Result Date: 04/13/2020 INDICATION: Previous history chronic feeding gastrostomy tube now with recurrent dysphagia and failure to thrive. Please image guided gastrostomy placement or enteric nutrition supplementation purposes. EXAM: PULL TROUGH GASTROSTOMY TUBE PLACEMENT COMPARISON:  Chest CT - 04/08/2020; fluoroscopic guided gastrostomy tube exchange-06/08/2016 MEDICATIONS: Patient currently admitted to the hospital receiving intravenous antibiotics; Antibiotics were administered within 1 hour of the procedure. Glucagon 1 mg IV CONTRAST:  25 mL of Omnipaque 300 administered into the gastric lumen.  ANESTHESIA/SEDATION: Moderate (conscious) sedation was employed during this procedure. A total of Versed 2 mg and Fentanyl 100 mcg was administered intravenously. Moderate Sedation Time: 19 minutes. The patient's level of consciousness and vital signs were monitored continuously by radiology nursing throughout the procedure under my direct supervision. FLUOROSCOPY TIME:  5 minutes, 54 seconds (54 mGy) COMPLICATIONS: None immediate. PROCEDURE: Informed written consent was obtained from the patient's family following explanation of the procedure, risks, benefits and alternatives. A time out was performed prior to the initiation of the procedure. Ultrasound scanning was performed to demarcate the edge of the left lobe of the liver. Maximal barrier sterile technique utilized including caps, mask, sterile gowns, sterile gloves, large sterile drape, hand hygiene and Betadine prep. The left upper quadrant was sterilely prepped and draped. An oral gastric catheter was inserted into the stomach under fluoroscopy. The existing nasogastric feeding tube was removed. The left costal margin and air opacified transverse colon were identified and avoided. Air was injected into the stomach for insufflation and visualization under fluoroscopy. Under sterile conditions a 17 gauge trocar needle was utilized to access the stomach percutaneously beneath the left subcostal margin after the overlying soft tissues were anesthetized with 1% Lidocaine with epinephrine. Needle position was confirmed within the stomach with aspiration of air and injection of small amount of contrast. A single T tack was deployed for gastropexy. Over an Amplatz guide wire, a 9-French sheath was inserted into the stomach. A snare device was utilized to capture the oral gastric catheter. The snare device was pulled retrograde from the stomach up the esophagus and out the oropharynx. The 20-French pull-through gastrostomy was connected to the snare device and  pulled antegrade through the oropharynx down the esophagus into the stomach and then through the percutaneous tract external to the patient. The gastrostomy was assembled externally. Contrast injection confirms position in the stomach. Several spot radiographic images were obtained in various obliquities for documentation. The patient tolerated procedure well without immediate post procedural complication. FINDINGS: After successful fluoroscopic guided placement, the gastrostomy tube is appropriately positioned with internal disc against the ventral aspect of the gastric lumen. IMPRESSION: Successful fluoroscopic insertion of a 20-French pull-through gastrostomy tube. The gastrostomy may be used immediately for medication administration and in 24 hrs for the initiation of feeds. Note, given marked distension of multiple loops of large and small bowel and associated suboptimal percutaneous window, a preprocedural noncontrast abdominal CT was obtained confirming appropriate positioning of the gastrostomy tube. Electronically Signed   By: Simonne Come M.D.   On: 04/13/2020 14:10   ECHOCARDIOGRAM LIMITED  Result Date: 04/12/2020    ECHOCARDIOGRAM LIMITED REPORT  Patient Name:   PATI THINNES Date of Exam: 04/12/2020 Medical Rec #:  [id number]          Height:       66.0 in Accession #:    [id number]         Weight:       112.0 lb Date of Birth:  November 21, 1965           BSA:          1.563 m Patient Age:    54 years           BP:           132/79 mmHg Patient Gender: F                  HR:           88 bpm. Exam Location:  Inpatient Procedure: Limited Echo, Cardiac Doppler and Color Doppler STAT ECHO Indications:    I31.3 Pericardial effusion (noninflammatory)  History:        Patient has prior history of Echocardiogram examinations, most                 recent 04/08/2020. Abnormal ECG, Stroke; Risk Factors:Diabetes.                 Pericardial effusion. Seizure. Pulmonary embolus.  Sonographer:    Sheralyn Boatman RDCS  Referring Phys: [id number] TRACI R TURNER  Sonographer Comments: Technically difficult study due to poor echo windows. IMPRESSIONS  1. Left ventricular ejection fraction, by estimation, is 40 to 45%. The left ventricle has mildly decreased function. The left ventricle demonstrates global hypokinesis.  2. Right ventricular systolic function is normal. The right ventricular size is normal.  3. A small pericardial effusion is present. The pericardial effusion is circumferential. There is no evidence of cardiac tamponade.  4. The mitral valve is normal in structure. No evidence of mitral valve regurgitation. No evidence of mitral stenosis.  5. The aortic valve is normal in structure. Aortic valve regurgitation is not visualized. No aortic stenosis is present.  6. The inferior vena cava is normal in size with greater than 50% respiratory variability, suggesting right atrial pressure of 3 mmHg. Comparison(s): Prior images reviewed side by side. Changes from prior study are noted. The left ventricular function is worsened. The pericardial effusion is smaller. FINDINGS  Left Ventricle: Left ventricular ejection fraction, by estimation, is 40 to 45%. The left ventricle has mildly decreased function. The left ventricle demonstrates global hypokinesis. The left ventricular internal cavity size was normal in size. There is  no left ventricular hypertrophy. Right Ventricle: The right ventricular size is normal. No increase in right ventricular wall thickness. Right ventricular systolic function is normal. Left Atrium: Left atrial size was normal in size. Right Atrium: Right atrial size was normal in size. Pericardium: A small pericardial effusion is present. The pericardial effusion is circumferential. There is no evidence of cardiac tamponade. Mitral Valve: The mitral valve is normal in structure. No evidence of mitral valve stenosis. Tricuspid Valve: The tricuspid valve is normal in structure. Tricuspid valve regurgitation is not  demonstrated. No evidence of tricuspid stenosis. Aortic Valve: The aortic valve is normal in structure. Aortic valve regurgitation is not visualized. No aortic stenosis is present. Pulmonic Valve: The pulmonic valve was normal in structure. Pulmonic valve regurgitation is not visualized. No evidence of pulmonic stenosis. Aorta: The aortic root is normal in size and structure. Venous: The inferior vena cava is normal in size  with greater than 50% respiratory variability, suggesting right atrial pressure of 3 mmHg. IAS/Shunts: No atrial level shunt detected by color flow Doppler. LEFT VENTRICLE PLAX 2D LVIDd:         4.16 cm LVIDs:         3.39 cm LV PW:         1.48 cm LV IVS:        1.03 cm  LV Volumes (MOD) LV vol d, MOD A2C: 43.4 ml LV vol d, MOD A4C: 72.0 ml LV vol s, MOD A2C: 23.1 ml LV vol s, MOD A4C: 35.3 ml LV SV MOD A2C:     20.3 ml LV SV MOD A4C:     72.0 ml LV SV MOD BP:      27.8 ml IVC IVC diam: 0.80 cm LEFT ATRIUM         Index LA diam:    3.40 cm 2.18 cm/m   AORTA Ao Root diam: 3.10 cm Ao Asc diam:  3.00 cm Thurmon Fair MD Electronically signed by Thurmon Fair MD Signature Date/Time: 04/12/2020/12:14:41 PM    Final     Cardiac Studies   Echo 04/12/2020 1. Left ventricular ejection fraction, by estimation, is 40 to 45%. The  left ventricle has mildly decreased function. The left ventricle  demonstrates global hypokinesis.  2. Right ventricular systolic function is normal. The right ventricular  size is normal.  3. A small pericardial effusion is present. The pericardial effusion is  circumferential. There is no evidence of cardiac tamponade.  4. The mitral valve is normal in structure. No evidence of mitral valve  regurgitation. No evidence of mitral stenosis.  5. The aortic valve is normal in structure. Aortic valve regurgitation is  not visualized. No aortic stenosis is present.  6. The inferior vena cava is normal in size with greater than 50%  respiratory variability,  suggesting right atrial pressure of 3 mmHg.   Comparison(s): Prior images reviewed side by side. Changes from prior  study are noted. The left ventricular function is worsened. The  pericardial effusion is smaller.    Echo 04/08/20: 1. Moderate pericardial effusion. The pericardial effusion is anterior to  the right ventricle. Mild RV diastolic compression without RV diastolic  collapse. Unable to assess IVC or hepatic veins. Respirometer not  performed. Study is suboptimal for  assessment of constricitive physiology. Tachycardia noted. Consider repeat  echo in 2-3 days or with clinical deterioration.  2. Left ventricular ejection fraction, by estimation, is 60 to 65%. The  left ventricle has normal function. Left ventricular endocardial border  not optimally defined to evaluate regional wall motion. Indeterminate  diastolic filling due to E-A fusion.  3. Right ventricular systolic function is normal. The right ventricular  size is normal. Tricuspid regurgitation signal is inadequate for assessing  PA pressure.  4. The mitral valve is normal in structure. No evidence of mitral valve  regurgitation. No evidence of mitral stenosis.  5. The aortic valve was not well visualized. Aortic valve regurgitation  is not visualized. No aortic stenosis is present.   Patient Profile     55 y.o. 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 She has had several complex medical admissions in the last few years with sepsis and multiple medical problem like AKI, SBO, anemia,  bacteremia, and so forth. She was admitted with sepsis and aspiration PNA felt related to chronic dysphagia. However, she continued  to have sinus tachycardia even after sepsis improved. CTA negative for PE.  An echocardiogram revealed LVEF 60 to 65% with a moderate pericardial effusion but no tamponade. Palliative care consulted and family elected continue full scope of care. Repeat limited echo 04/12/20 showed EF 40-45% with decrease in pericardial effusion to small.  Assessment & Plan    1. Sinus tachycardia - suspect driven by underlying metabolic stressors although with continued tachycardia despite improved measures - continue intervention for medical issues - CT scan post PEG showed "Marked distension of several loops, incompletely evaluated as the site of obstruction was imaged on present examination. Clinical correlation is advised. Further evaluation with complete CT scan the abdomen and pelvis could be performed indicated" - TSH wnl, anemia relatively stable - HR 80s so would continue present dose of metoprolol  2. Pericardial effusion - small by repeat echo 04/12/20 (was small in 2019 as well so this is not a totally new phenomenon for her but should be followed in outpatient setting)  3. New LV dysfunction/acute systolic CHF - LVEF 40-45% by echo 04/12/20, suspected tachy-mediated, can also be due to stress of underlying illness, cannot fully exclude underlying CAD given cardiac risk factors - Dr. Mayford Knifeurner recommends repeat echo as outpatient in 6-8 weeks on guideline directed medical therapy and if no improvement in EF, consider ischemic workup - this can be arranged in OP followup since we will need to follow overall medical trajectory to guide this decision as well - consider adding ARB today - given dysphagia issues maybe hold off on Entresto until she proves medical stability as OP - hold off standing diuretic given dehydration/severe hypernatremia earlier this admission. Appears relatively euvolemic  4. History of DVT/PE - negative for PE this admission. Xarelto held due to PEG tube placement - defer to primary  team regarding resumption  5. PVCs/ventricular bigeminy - Keep 4.0 or greater, Mg 2.0 or greater - primary team managing lytes - continue BB  6. Medical issues including severe sepsis with aspiration PNA, dysphagia with PEG tube insertion this admission, dehydration, hypernatremia - continue care per primary team  For questions or updates, please contact [facility] Please consult [email or web address] for contact info under Cardiology/STEMI.  Signed, Laurann Montanaayna N Elka Satterfield, PA-C 04/14/2020, 11:04 AM

## 2020-04-14 NOTE — Progress Notes (Signed)
Discussed f/u timing with MD due to appt availability. F/u scheduled Thursday May 05, 2020 11:15 AM. Also relayed request to IM to include request for patient to have a BMET at her SNF within 1 week with their primary care team. Ronie Spies PA-C

## 2020-05-03 NOTE — Progress Notes (Signed)
Cardiology Clinic Note   Patient Name: Rachel Vang Date of Encounter: 05/05/2020  Primary Care Provider:  Patient, No Pcp Per (Inactive) Primary Cardiologist:  Chilton Si, MD  Patient Profile    Rachel Vang 55 year old female presents the clinic today for follow-up evaluation of her sinus tachycardia, pericardial effusion, and acute systolic CHF.  Past Medical History    Past Medical History:  Diagnosis Date  . Acute pulmonary embolism (HCC) 02/19/2015  . Acute respiratory failure (HCC)   . Diabetes mellitus without complication (HCC)    Type 2, W/o complications  . DVT (deep venous thrombosis) (HCC) 04/05/2015  . Dysphagia   . Epilepsy (HCC)   . GERD (gastroesophageal reflux disease)   . Hyperlipidemia   . Hypertension   . IBS (irritable bowel syndrome)   . Nontraumatic subarachnoid hemorrhage (HCC)   . SAH (subarachnoid hemorrhage) (HCC)   . Urinary retention    Past Surgical History:  Procedure Laterality Date  . ABDOMINAL SURGERY    . ANEURYSM COILING    . COLONOSCOPY N/A 02/20/2015   Procedure: COLONOSCOPY;  Surgeon: Iva Boop, MD;  Location: [facility] ENDOSCOPY;  Service: Endoscopy;  Laterality: N/A;  . ESOPHAGOGASTRODUODENOSCOPY (EGD) WITH PROPOFOL N/A 02/02/2015   Procedure: ESOPHAGOGASTRODUODENOSCOPY (EGD) WITH PROPOFOL;  Surgeon: Jimmye Norman, MD;  Location: [facility] ENDOSCOPY;  Service: General;  Laterality: N/A;  . IR GASTROSTOMY TUBE MOD SED  04/13/2020  . IR GENERIC HISTORICAL  08/26/2015   IR GASTRIC TUBE PERC CHG W/O IMG GUIDE 08/26/2015 Irish Lack, MD [facility]INTER[facility] RAD  . IR GENERIC HISTORICAL  09/14/2015   IR REPLC GASTRO/COLONIC TUBE PERCUT W/FLUORO 09/14/2015 Darrell K Allred, PA-C [facility]  . IR REPLACE G-TUBE SIMPLE WO FLUORO  06/08/2016  . IR REPLACE G-TUBE SIMPLE WO FLUORO  10/24/2016  . IR REPLACE G-TUBE SIMPLE WO FLUORO  01/07/2017  . PEG PLACEMENT N/A 02/02/2015   Procedure: PERCUTANEOUS ENDOSCOPIC GASTROSTOMY (PEG) PLACEMENT;  Surgeon:  Jimmye Norman, MD;  Location: [facility] ENDOSCOPY;  Service: General;  Laterality: N/A;  . RADIOLOGY WITH ANESTHESIA N/A 01/13/2015   Procedure: RADIOLOGY WITH ANESTHESIA;  Surgeon: Lisbeth Renshaw, MD;  Location: [facility] OR;  Service: Radiology;  Laterality: N/A;  . TRACHEOSTOMY      Allergies  No Known Allergies  History of Present Illness    Ms. Rachel Vang has a PMH of HTN, HLD, subarachnoid hemorrhage 2017 following fall (ACA aneurysm coiling, IVC drain placement) she required subsequent tracheostomy/PEG tube, she subsequently developed PE/DVT and lower GI bleed, left hemiparesis, SPT 2019, mild pericardial effusion 2019, diabetes, and epilepsy.  She is bedbound.  She is noted to have several complex medical admissions over the last few years with sepsis and multiple medical problems including AKI, SBO, anemia, and bacteremia.  She was admitted to the hospital with sepsis and aspiration pneumonia felt to be related to chronic disease patient.  She continued to have sinus tachycardia even after sepsis improved.  She underwent CTA which was negative for PE.  Her echocardiogram showed an LVEF of 60-65% with moderate pericardial effusion but no tamponade.  Palliative care was consulted and family elected to continue full care.  A repeat limited echo 04/12/2020 showed an EF of 40-45% with decrease in pericardial effusion.  She presents the clinic today for follow-up evaluation with her caregiver and reports she feels okay.  She is trying to be somewhat physically active but is confined to her wheelchair.  She reports she can feel that her heart rate is elevated.  When  asked if she received her metoprolol today she said she did not.  However, her caregiver reports that the facility stated her medications were given.  They were instructed to take the morning dose of metoprolol upon arriving back to the facility.  Expressed understanding.  On review of her MAR her metoprolol tartrate is ordered for 5 mL / 12.5 mg twice  daily.  I reviewed the correct dosing with a healthcare provider and wrote to have increased to 20 mL twice daily.  She expressed understanding.  We reviewed her prior echocardiogram and I will order a follow-up echocardiogram for EF and to reassess effusion.  We will have her follow-up in 1 month.  Today she denies chest pain, shortness of breath, lower extremity edema,  palpitations, melena, hematuria, hemoptysis, diaphoresis, weakness, presyncope, syncope, orthopnea, and PND.   Home Medications    Prior to Admission medications   Medication Sig Start Date End Date Taking? Authorizing Provider  acetaminophen (TYLENOL) 325 MG tablet Take 650 mg by mouth every 6 (six) hours.    [provider]  bethanechol (URECHOLINE) 10 MG tablet Take 10 mg by mouth 3 (three) times daily. 09/08/17   [provider]  docusate sodium (COLACE) 100 MG capsule Take 100 mg by mouth daily.    [provider]  gabapentin (NEURONTIN) 100 MG capsule Take 200 mg by mouth 2 (two) times daily. 09/08/17   Ellwood Sayers, MD  guaiFENesin (MUCINEX) 600 MG 12 hr tablet Take 600 mg by mouth every 6 (six) hours as needed for cough.    [provider]  levETIRAcetam (KEPPRA) 100 MG/ML solution Take 7.5 mLs (750 mg total) by mouth 2 (two) times daily. 04/14/20   Meredeth Ide, MD  losartan (COZAAR) 25 MG tablet Take 1 tablet (25 mg total) by mouth daily. 04/14/20   Meredeth Ide, MD  metoprolol tartrate (LOPRESSOR) 25 mg/10 mL SUSP Take 20 mLs (50 mg total) by mouth 2 (two) times daily. 04/14/20   Meredeth Ide, MD  mirtazapine (REMERON) 15 MG tablet Take 15 mg by mouth at bedtime.    [provider]  NON FORMULARY Apply 1 application topically 2 (two) times daily. Hand palm protector    [provider]  Nutritional Supplements (NUTRITIONAL SUPPLEMENT PO) Take 237 mLs by mouth 2 (two) times daily. Ensure    [provider]  polyethylene glycol (MIRALAX / GLYCOLAX) packet  Take 17 g by mouth daily.    [provider]  Probiotic Product (PROBIOTIC DAILY PO) Take 1 capsule by mouth 2 (two) times daily. 09/08/17   [provider]  rivaroxaban (XARELTO) 20 MG TABS tablet Take 1 tablet (20 mg total) by mouth at bedtime. 04/15/20   Meredeth Ide, MD  rosuvastatin (CRESTOR) 5 MG tablet Take 5 mg by mouth daily. 09/24/17   [provider]  sertraline (ZOLOFT) 50 MG tablet Take 50 mg by mouth daily.    [provider]  traZODone (DESYREL) 100 MG tablet Take 100 mg by mouth at bedtime.    [provider]  urea (CARMOL) 40 % CREA Apply 1 application topically daily. To both feet    [provider]    Family History    Family History  Problem Relation Age of Onset  . Hypertension Other    She indicated that the status of her other is unknown.  Social History    Social History   Socioeconomic History  . Marital status: Single  Spouse name: Not on file  . Number of children: Not on file  . Years of education: Not on file  . Highest education level: Not on file  Occupational History  . Not on file  Tobacco Use  . Smoking status: Former Games developer  . Smokeless tobacco: Never Used  Substance and Sexual Activity  . Alcohol use: No  . Drug use: No  . Sexual activity: Not on file  Other Topics Concern  . Not on file  Social History Narrative  . Not on file   Social Determinants of Health   Financial Resource Strain: Not on file  Food Insecurity: Not on file  Transportation Needs: Not on file  Physical Activity: Not on file  Stress: Not on file  Social Connections: Not on file  Intimate Partner Violence: Not on file     Review of Systems    General:  No chills, fever, night sweats or weight changes.  Cardiovascular:  No chest pain, dyspnea on exertion, edema, orthopnea, palpitations, paroxysmal nocturnal dyspnea. Dermatological: No rash, lesions/masses Respiratory: No cough, dyspnea Urologic: No  hematuria, dysuria Abdominal:   No nausea, vomiting, diarrhea, bright red blood per rectum, melena, or hematemesis Neurologic:  No visual changes, wkns, changes in mental status. All other systems reviewed and are otherwise negative except as noted above.  Physical Exam    VS:  BP 110/83   Pulse (!) 110   Ht 5' (1.524 m)   LMP  (LMP Unknown)   SpO2 99%   BMI 21.52 kg/m  , BMI Body mass index is 21.52 kg/m. GEN: Well nourished, well developed, in no acute distress. HEENT: normal. Neck: Supple, no JVD, carotid bruits, or masses. Cardiac: RRR, no murmurs, rubs, or gallops. No clubbing, cyanosis, edema.  Radials/DP/PT 2+ and equal bilaterally.  Respiratory:  Respirations regular and unlabored, clear to auscultation bilaterally. GI: Soft, nontender, nondistended, BS + x 4. MS: no deformity or atrophy. Skin: warm and dry, no rash. Neuro:  Strength and sensation are intact. Psych: Normal affect.  Accessory Clinical Findings    Recent Labs: 04/08/2020: ALT 13 04/09/2020: TSH 0.941 04/12/2020: Magnesium 2.0 04/14/2020: BUN <5; Creatinine, Ser 0.49; Hemoglobin 10.0; Platelets 425; Potassium 3.3; Sodium 139   Recent Lipid Panel    Component Value Date/Time   CHOL 112 06/03/2017 0000   TRIG 127 06/03/2017 0000   HDL 29 (A) 06/03/2017 0000   LDLCALC 58 06/03/2017 0000    ECG personally reviewed by me today-sinus tachycardia with occasional PVCs and fusion complexes 140 bpm.  (On rechecking pulse it is around 110 bpm.  She reports she did not receive her metoprolol today)  Echocardiogram 04/12/2020 IMPRESSIONS    1. Left ventricular ejection fraction, by estimation, is 40 to 45%. The  left ventricle has mildly decreased function. The left ventricle  demonstrates global hypokinesis.  2. Right ventricular systolic function is normal. The right ventricular  size is normal.  3. A small pericardial effusion is present. The pericardial effusion is  circumferential. There is no  evidence of cardiac tamponade.  4. The mitral valve is normal in structure. No evidence of mitral valve  regurgitation. No evidence of mitral stenosis.  5. The aortic valve is normal in structure. Aortic valve regurgitation is  not visualized. No aortic stenosis is present.  6. The inferior vena cava is normal in size with greater than 50%  respiratory variability, suggesting right atrial pressure of 3 mmHg.   Comparison(s): Prior images reviewed side by side. Changes from  prior  study are noted. The left ventricular function is worsened. The  pericardial effusion is smaller.  Assessment & Plan   1.  Sinus tachycardia-heart rate today 110.  Notes increased heart rate.  She reports that she did not receive her morning dose of metoprolol.  On review of the MAR 12.5 mg twice daily was ordered and her discharge dose was 50 mg twice daily.  While admitted it was felt that her tachycardia was driven by metabolic stressors however after sepsis treatment tachycardia continued.  TSH normal, anemia stable.  Heart rate in the hospital in the 80s. Increased metoprolol to discharge dose-  50 mg twice daily Take this dose upon arrival back to facility and resume normal dosing. Heart healthy low-sodium diet-salty 6 given Increase physical activity as tolerated  Pericardial effusion-denies chest pain.  Repeat echocardiogram 04/12/2020 showed smaller pericardial effusion. Repeat echocardiogram in 2-4 weeks  or sooner if clinically indicated.  LV dysfunction/acute systolic CHF-no increased DOE or activity intolerance.  Patient bedbound.  Echocardiogram showed LVEF 40-45%. Plan to repeat echocardiogram in 2-4 weeks.  If improvement in EF consider ischemic evaluation. Start losartan 25 mg daily Heart healthy low-sodium diet-salty 6 given Increase physical activity as tolerated  History of DVT/PE-on Xarelto.  Denies bleeding issues. Continue Xarelto therapy  Disposition: Follow-up with Dr. Duke Salviaandolph  after echocardiogram   Thomasene RippleJesse M. Shaunette Gassner NP-C    05/05/2020, 11:29 AM [facility] [address] Office [phone] Fax [phone]  Notice: This dictation was prepared with Dragon dictation along with smaller phrase technology. Any transcriptional errors that result from this process are unintentional and may not be corrected upon review.  I spent 14 minutes examining this patient, reviewing medications, and using patient centered shared decision making involving her cardiac care.  Prior to her visit I spent greater than 20 minutes reviewing her past medical history,  medications, and prior cardiac tests.

## 2020-05-05 ENCOUNTER — Encounter: Payer: Self-pay | Admitting: General Practice

## 2020-05-05 ENCOUNTER — Other Ambulatory Visit: Payer: Self-pay

## 2020-05-05 ENCOUNTER — Ambulatory Visit (INDEPENDENT_AMBULATORY_CARE_PROVIDER_SITE_OTHER): Payer: Medicaid Other | Admitting: General Practice

## 2020-05-05 VITALS — BP 110/83 | HR 110 | Ht 60.0 in

## 2020-05-05 DIAGNOSIS — I2609 Other pulmonary embolism with acute cor pulmonale: Secondary | ICD-10-CM

## 2020-05-05 DIAGNOSIS — Z86718 Personal history of other venous thrombosis and embolism: Secondary | ICD-10-CM

## 2020-05-05 DIAGNOSIS — I5021 Acute systolic (congestive) heart failure: Secondary | ICD-10-CM | POA: Diagnosis not present

## 2020-05-05 DIAGNOSIS — R000 Tachycardia, unspecified: Secondary | ICD-10-CM

## 2020-05-05 DIAGNOSIS — I313 Pericardial effusion (noninflammatory): Secondary | ICD-10-CM | POA: Diagnosis not present

## 2020-05-05 DIAGNOSIS — I319 Disease of pericardium, unspecified: Secondary | ICD-10-CM

## 2020-05-05 DIAGNOSIS — I3139 Other pericardial effusion (noninflammatory): Secondary | ICD-10-CM

## 2020-05-05 NOTE — Patient Instructions (Signed)
Medication Instructions:  INCREASE METOPROLOL 20ML'S TWICE DAILY *If you need a refill on your cardiac medications before your next appointment, please call your pharmacy*  Special Instructions Echocardiogram/IN 2-4 WEEKS - Your physician has requested that you have an echocardiogram. Echocardiography is a painless test that uses sound waves to create images of your heart. It provides your doctor with information about the size and shape of your heart and how well your heart's chambers and valves are working. This procedure takes approximately one hour. There are no restrictions for this procedure. This will be performed at our [facility] location - [address].  Follow-Up: Your next appointment:  3 month(s) In Person with Chilton Si, MD OR IF UNAVAILABLE JESSE CLEAVER, FNP-C   At [facility], you and your health needs are our priority.  As part of our continuing mission to provide you with exceptional heart care, we have created designated Provider Care Teams.  These Care Teams include your primary Cardiologist (physician) and Advanced Practice Providers (APPs -  Physician Assistants and Nurse Practitioners) who all work together to provide you with the care you need, when you need it.  We recommend signing up for the patient portal called "MyChart".  Sign up information is provided on this After Visit Summary.  MyChart is used to connect with patients for Virtual Visits (Telemedicine).  Patients are able to view lab/test results, encounter notes, upcoming appointments, etc.  Non-urgent messages can be sent to your provider as well.   To learn more about what you can do with MyChart, go to [email or web address].              6 SALTY THINGS TO AVOID     1,800MG  DAILY

## 2020-06-14 ENCOUNTER — Other Ambulatory Visit: Payer: Self-pay

## 2020-06-14 ENCOUNTER — Other Ambulatory Visit: Payer: Self-pay | Admitting: General Practice

## 2020-06-14 ENCOUNTER — Ambulatory Visit (HOSPITAL_COMMUNITY): Payer: Medicaid Other | Attending: Cardiology

## 2020-06-14 DIAGNOSIS — I319 Disease of pericardium, unspecified: Secondary | ICD-10-CM | POA: Insufficient documentation

## 2020-06-14 DIAGNOSIS — I3139 Other pericardial effusion (noninflammatory): Secondary | ICD-10-CM

## 2020-06-14 DIAGNOSIS — I313 Pericardial effusion (noninflammatory): Secondary | ICD-10-CM | POA: Insufficient documentation

## 2020-06-14 LAB — ECHOCARDIOGRAM LIMITED
Area-P 1/2: 3.08 cm2
S' Lateral: 2.8 cm

## 2020-06-15 ENCOUNTER — Telehealth: Payer: Self-pay

## 2020-06-15 NOTE — Telephone Encounter (Signed)
NOT NEEDED

## 2020-06-27 NOTE — Progress Notes (Signed)
Cardiology Clinic Note   Patient Name: Rachel Vang Date of Encounter: 06/28/2020  Primary Care Provider:  Patient, No Pcp Per (Inactive) Primary Cardiologist:  Chilton Si, MD  Patient Profile    Rachel Vang 55 year old female presents the clinic today for follow-up evaluation of her sinus tachycardia, pericardial effusion, and acute systolic CHF.  Past Medical History    Past Medical History:  Diagnosis Date  . Acute pulmonary embolism (HCC) 02/19/2015  . Acute respiratory failure (HCC)   . Diabetes mellitus without complication (HCC)    Type 2, W/o complications  . DVT (deep venous thrombosis) (HCC) 04/05/2015  . Dysphagia   . Epilepsy (HCC)   . GERD (gastroesophageal reflux disease)   . Hyperlipidemia   . Hypertension   . IBS (irritable bowel syndrome)   . Nontraumatic subarachnoid hemorrhage (HCC)   . SAH (subarachnoid hemorrhage) (HCC)   . Urinary retention    Past Surgical History:  Procedure Laterality Date  . ABDOMINAL SURGERY    . ANEURYSM COILING    . COLONOSCOPY N/A 02/20/2015   Procedure: COLONOSCOPY;  Surgeon: Iva Boop, MD;  Location: [facility] ENDOSCOPY;  Service: Endoscopy;  Laterality: N/A;  . ESOPHAGOGASTRODUODENOSCOPY (EGD) WITH PROPOFOL N/A 02/02/2015   Procedure: ESOPHAGOGASTRODUODENOSCOPY (EGD) WITH PROPOFOL;  Surgeon: Jimmye Norman, MD;  Location: [facility] ENDOSCOPY;  Service: General;  Laterality: N/A;  . IR GASTROSTOMY TUBE MOD SED  04/13/2020  . IR GENERIC HISTORICAL  08/26/2015   IR GASTRIC TUBE PERC CHG W/O IMG GUIDE 08/26/2015 Irish Lack, MD [facility]-INTERV RAD  . IR GENERIC HISTORICAL  09/14/2015   IR REPLC GASTRO/COLONIC TUBE PERCUT W/FLUORO 09/14/2015 Darrell K Allred, PA-C [facility]  . IR REPLACE G-TUBE SIMPLE WO FLUORO  06/08/2016  . IR REPLACE G-TUBE SIMPLE WO FLUORO  10/24/2016  . IR REPLACE G-TUBE SIMPLE WO FLUORO  01/07/2017  . PEG PLACEMENT N/A 02/02/2015   Procedure: PERCUTANEOUS ENDOSCOPIC GASTROSTOMY (PEG) PLACEMENT;  Surgeon:  Jimmye Norman, MD;  Location: [facility] ENDOSCOPY;  Service: General;  Laterality: N/A;  . RADIOLOGY WITH ANESTHESIA N/A 01/13/2015   Procedure: RADIOLOGY WITH ANESTHESIA;  Surgeon: Lisbeth Renshaw, MD;  Location: [facility] OR;  Service: Radiology;  Laterality: N/A;  . TRACHEOSTOMY      Allergies  No Known Allergies  History of Present Illness    Rachel Vang has a PMH of HTN, HLD, subarachnoid hemorrhage 2017 following fall (ACA aneurysm coiling, IVC drain placement) she required subsequent tracheostomy/PEG tube, she subsequently developed PE/DVT and lower GI bleed, left hemiparesis, SPT 2019, mild pericardial effusion 2019, diabetes, and epilepsy.  She is bedbound.  She is noted to have several complex medical admissions over the last few years with sepsis and multiple medical problems including AKI, SBO, anemia, and bacteremia.  She was admitted to the hospital with sepsis and aspiration pneumonia felt to be related to chronic disease patient.  She continued to have sinus tachycardia even after sepsis improved.  She underwent CTA which was negative for PE.  Her echocardiogram showed an LVEF of 60-65% with moderate pericardial effusion but no tamponade.  Palliative care was consulted and family elected to continue full care.  A repeat limited echo 04/12/2020 showed an EF of 40-45% with decrease in pericardial effusion.  She presented the clinic 05/05/20 for follow-up evaluation with her caregiver and reported she felt okay.  She was trying to be somewhat physically active but was confined to her wheelchair.  She reported she could feel that her heart rate was elevated.  When  asked if she received her metoprolol that day she said she had not.  However, her caregiver reported that the facility stated her medications were given.  They were instructed to take the morning dose of metoprolol upon arriving back to the facility.  They expressed understanding.  On review of her MAR her metoprolol tartrate was ordered for 5  mL / 12.5 mg twice daily.  I reviewed the correct dosing with a healthcare provider and wrote to have increased to 20 mL twice daily.  She expressed understanding.  We reviewed her prior echocardiogram and I  ordered a follow-up echocardiogram for EF and to reassess effusion.  We planned her follow-up for 1 month.  Her follow-up echocardiogram showed improved LVEF 45-50%, G1 DD, and improved pericardial effusion.  She presents to clinic today for follow-up evaluation she reports she feels well.  She does note some night knee pain.  She is a poor historian and is unable to provide reasons for the discomfort.  She reports that Tylenol has been helping with her pain.  She reports that her heart rate has been much better and she denies chest pain.  Her blood pressure is well controlled today at 127/85.  We will order a BMP, continue her current medications, and have her follow-up in 6 months.  Today she denies chest pain, shortness of breath, lower extremity edema,  palpitations, melena, hematuria, hemoptysis, diaphoresis, weakness, presyncope, syncope, orthopnea, and PND.  Home Medications    Prior to Admission medications   Medication Sig Start Date End Date Taking? Authorizing Provider  acetaminophen (TYLENOL) 325 MG tablet Take 650 mg by mouth every 6 (six) hours.    [provider]  bethanechol (URECHOLINE) 10 MG tablet Take 10 mg by mouth 3 (three) times daily. 09/08/17   [provider]  docusate sodium (COLACE) 100 MG capsule Take 100 mg by mouth daily.    [provider]  gabapentin (NEURONTIN) 100 MG capsule Take 200 mg by mouth 2 (two) times daily. 09/08/17   Ellwood Sayersananis, Leonard, MD  guaiFENesin (MUCINEX) 600 MG 12 hr tablet Take 600 mg by mouth every 6 (six) hours as needed for cough.    [provider]  levETIRAcetam (KEPPRA) 100 MG/ML solution Take 7.5 mLs (750 mg total) by mouth 2 (two) times daily. 04/14/20   Meredeth IdeLama, Gagan S, MD  losartan (COZAAR) 25 MG tablet  Take 1 tablet (25 mg total) by mouth daily. 04/14/20   Meredeth IdeLama, Gagan S, MD  metoprolol tartrate (LOPRESSOR) 25 mg/10 mL SUSP Take 20 mLs (50 mg total) by mouth 2 (two) times daily. 04/14/20   Meredeth IdeLama, Gagan S, MD  mirtazapine (REMERON) 15 MG tablet Take 15 mg by mouth at bedtime.    [provider]  NON FORMULARY Apply 1 application topically 2 (two) times daily. Hand palm protector    [provider]  Nutritional Supplements (NUTRITIONAL SUPPLEMENT PO) Take 237 mLs by mouth 2 (two) times daily. Ensure    [provider]  polyethylene glycol (MIRALAX / GLYCOLAX) packet Take 17 g by mouth daily.    [provider]  Probiotic Product (PROBIOTIC DAILY PO) Take 1 capsule by mouth 2 (two) times daily. 09/08/17   [provider]  rivaroxaban (XARELTO) 20 MG TABS tablet Take 1 tablet (20 mg total) by mouth at bedtime. 04/15/20   Meredeth IdeLama, Gagan S, MD  rosuvastatin (CRESTOR) 5 MG tablet Take 5 mg by mouth daily. 09/24/17   [provider]  sertraline (ZOLOFT) 50 MG  tablet Take 50 mg by mouth daily.    [provider]  traZODone (DESYREL) 100 MG tablet Take 100 mg by mouth at bedtime.    [provider]  urea (CARMOL) 40 % CREA Apply 1 application topically daily. To both feet    [provider]    Family History    Family History  Problem Relation Age of Onset  . Hypertension Other    She indicated that the status of her other is unknown.  Social History    Social History   Socioeconomic History  . Marital status: Single    Spouse name: Not on file  . Number of children: Not on file  . Years of education: Not on file  . Highest education level: Not on file  Occupational History  . Not on file  Tobacco Use  . Smoking status: Former Games developer  . Smokeless tobacco: Never Used  Substance and Sexual Activity  . Alcohol use: No  . Drug use: No  . Sexual activity: Not on file  Other Topics Concern  . Not on file  Social  History Narrative  . Not on file   Social Determinants of Health   Financial Resource Strain: Not on file  Food Insecurity: Not on file  Transportation Needs: Not on file  Physical Activity: Not on file  Stress: Not on file  Social Connections: Not on file  Intimate Partner Violence: Not on file     Review of Systems    General:  No chills, fever, night sweats or weight changes.  Cardiovascular:  No chest pain, dyspnea on exertion, edema, orthopnea, palpitations, paroxysmal nocturnal dyspnea. Dermatological: No rash, lesions/masses Respiratory: No cough, dyspnea Urologic: No hematuria, dysuria Abdominal:   No nausea, vomiting, diarrhea, bright red blood per rectum, melena, or hematemesis Neurologic:  No visual changes, wkns, changes in mental status. All other systems reviewed and are otherwise negative except as noted above.  Physical Exam    VS:  BP 127/85   Pulse 90   Resp (!) 99   LMP  (LMP Unknown)  , BMI There is no height or weight on file to calculate BMI. GEN: Well nourished, well developed, in no acute distress. HEENT: normal. Neck: Supple, no JVD, carotid bruits, or masses. Cardiac: RRR, no murmurs, rubs, or gallops. No clubbing, cyanosis, edema.  Radials/DP/PT 2+ and equal bilaterally.  Respiratory:  Respirations regular and unlabored, clear to auscultation bilaterally. GI: Soft, nontender, nondistended, BS + x 4. MS: no deformity or atrophy. Skin: warm and dry, no rash. Neuro:  Strength and sensation are intact. Psych: Normal affect.  Accessory Clinical Findings    Recent Labs: 04/08/2020: ALT 13 04/09/2020: TSH 0.941 04/12/2020: Magnesium 2.0 04/14/2020: BUN <5; Creatinine, Ser 0.49; Hemoglobin 10.0; Platelets 425; Potassium 3.3; Sodium 139   Recent Lipid Panel    Component Value Date/Time   CHOL 112 06/03/2017 0000   TRIG 127 06/03/2017 0000   HDL 29 (A) 06/03/2017 0000   LDLCALC 58 06/03/2017 0000    ECG personally reviewed by me today-none  today.  ECG personally reviewed by me 05/05/20 -sinus tachycardia with occasional PVCs and fusion complexes 140 bpm.  (On rechecking pulse it is around 110 bpm.  She reports she did not receive her metoprolol)  Echocardiogram 04/12/2020 IMPRESSIONS    1. Left ventricular ejection fraction, by estimation, is 40 to 45%. The  left ventricle has mildly decreased function. The left ventricle  demonstrates global hypokinesis.  2. Right ventricular systolic function  is normal. The right ventricular  size is normal.  3. A small pericardial effusion is present. The pericardial effusion is  circumferential. There is no evidence of cardiac tamponade.  4. The mitral valve is normal in structure. No evidence of mitral valve  regurgitation. No evidence of mitral stenosis.  5. The aortic valve is normal in structure. Aortic valve regurgitation is  not visualized. No aortic stenosis is present.  6. The inferior vena cava is normal in size with greater than 50%  respiratory variability, suggesting right atrial pressure of 3 mmHg.   Comparison(s): Prior images reviewed side by side. Changes from prior  study are noted. The left ventricular function is worsened. The  pericardial effusion is smaller.   Echocardiogram 06/14/2020 IMPRESSIONS    1. Left ventricular ejection fraction, by estimation, is 45 to 50%. The  left ventricle has mildly decreased function. The left ventricle  demonstrates global hypokinesis. Left ventricular diastolic parameters are  consistent with Grade I diastolic  dysfunction (impaired relaxation).  2. Right ventricular systolic function is normal. The right ventricular  size is normal.  3. The mitral valve is normal in structure. No evidence of mitral valve  regurgitation. No evidence of mitral stenosis.  4. The aortic valve is tricuspid. Aortic valve regurgitation is not  visualized. No aortic stenosis is present.  5. The inferior vena cava is normal in size  with greater than 50%  respiratory variability, suggesting right atrial pressure of 3 mmHg.  Assessment & Plan   1. Sinus tachycardia-heart rate today 90.   Reports heart rate much improved with increased metoprolol dosing.   With previous admission  it was felt that her tachycardia was driven by metabolic stressors however after sepsis treatment tachycardia continued.  TSH normal, anemia stable.  Heart rate in the hospital in the 80s. Continue metoprolol  50 mg twice daily Heart healthy low-sodium diet-salty 6 given Increase physical activity as tolerated Avoid caffeine, chocolate, EtOH, dehydration etc.  Pericardial effusion- no chest pain today.  Repeat echocardiogram 06/14/2020 showed improved EF and pericardial effusion. Repeat when clinically indicated.  LV dysfunction/acute systolic CHF-no increased DOE or activity intolerance.  Patient bedbound.  Echocardiogram showed LVEF 40-45%. Continue losartan 25 mg daily Heart healthy low-sodium diet-salty 6 given Increase physical activity as tolerated Order BMP  History of DVT/PE-on apixaban.  Denies bleeding issues. Continue Eliquis therapy  Disposition: Follow-up with Dr. Duke Salvia in 4-6 months   Thomasene Ripple. Sunshine Mackowski NP-C    06/28/2020, 10:34 AM [facility] [address] Office [phone] Fax [phone]  Notice: This dictation was prepared with Dragon dictation along with smaller phrase technology. Any transcriptional errors that result from this process are unintentional and may not be corrected upon review.  I spent 12 minutes examining this patient, reviewing medications, and using patient centered shared decision making involving her cardiac care.  Prior to her visit I spent greater than 20 minutes reviewing her past medical history,  medications, and prior cardiac tests.

## 2020-06-28 ENCOUNTER — Other Ambulatory Visit: Payer: Self-pay

## 2020-06-28 ENCOUNTER — Ambulatory Visit (INDEPENDENT_AMBULATORY_CARE_PROVIDER_SITE_OTHER): Payer: Medicaid Other | Admitting: General Practice

## 2020-06-28 ENCOUNTER — Encounter: Payer: Self-pay | Admitting: General Practice

## 2020-06-28 VITALS — BP 127/85 | HR 90 | Resp 99

## 2020-06-28 DIAGNOSIS — I3139 Other pericardial effusion (noninflammatory): Secondary | ICD-10-CM

## 2020-06-28 DIAGNOSIS — R000 Tachycardia, unspecified: Secondary | ICD-10-CM | POA: Diagnosis not present

## 2020-06-28 DIAGNOSIS — Z86718 Personal history of other venous thrombosis and embolism: Secondary | ICD-10-CM

## 2020-06-28 DIAGNOSIS — Z79899 Other long term (current) drug therapy: Secondary | ICD-10-CM

## 2020-06-28 DIAGNOSIS — I5021 Acute systolic (congestive) heart failure: Secondary | ICD-10-CM

## 2020-06-28 DIAGNOSIS — I313 Pericardial effusion (noninflammatory): Secondary | ICD-10-CM | POA: Diagnosis not present

## 2020-06-28 LAB — BASIC METABOLIC PANEL
BUN/Creatinine Ratio: 29 — ABNORMAL HIGH (ref 9–23)
BUN: 21 mg/dL (ref 6–24)
CO2: 25 mmol/L (ref 20–29)
Calcium: 10.4 mg/dL — ABNORMAL HIGH (ref 8.7–10.2)
Chloride: 98 mmol/L (ref 96–106)
Creatinine, Ser: 0.73 mg/dL (ref 0.57–1.00)
Glucose: 105 mg/dL — ABNORMAL HIGH (ref 65–99)
Potassium: 4.4 mmol/L (ref 3.5–5.2)
Sodium: 138 mmol/L (ref 134–144)
eGFR: 96 mL/min/{1.73_m2} (ref >59)

## 2020-06-28 NOTE — Patient Instructions (Signed)
Medication Instructions:  The current medical regimen is effective;  continue present plan and medications as directed. Please refer to the Current Medication list given to you today.  *If you need a refill on your cardiac medications before your next appointment, please call your pharmacy*  Lab Work:   Testing/Procedures:  BMET TODAY   .NONE  Special Instructions PLEASE BRING NURSE OR AIDE TO NEXT VISIT TO FACILITATE COMMUNICATION.  Follow-Up: Your next appointment:  6 month(s) In Person with Chilton Si, MD OR IF UNAVAILABLE JESSE CLEAVER, FNP-C   Please call our office 2 months in advance to schedule this appointment   At [facility], you and your health needs are our priority.  As part of our continuing mission to provide you with exceptional heart care, we have created designated Provider Care Teams.  These Care Teams include your primary Cardiologist (physician) and Advanced Practice Providers (APPs -  Physician Assistants and Nurse Practitioners) who all work together to provide you with the care you need, when you need it.  We recommend signing up for the patient portal called "MyChart".  Sign up information is provided on this After Visit Summary.  MyChart is used to connect with patients for Virtual Visits (Telemedicine).  Patients are able to view lab/test results, encounter notes, upcoming appointments, etc.  Non-urgent messages can be sent to your provider as well.   To learn more about what you can do with MyChart, go to [email or web address].

## 2020-07-13 ENCOUNTER — Telehealth: Payer: Self-pay | Admitting: Cardiovascular Disease

## 2020-07-13 NOTE — Telephone Encounter (Signed)
[facility] (nursing home) calling to get patient new orders that Edd Fabian put in. Please fax information over [phone] and then call facility. Please advise

## 2020-07-13 NOTE — Telephone Encounter (Signed)
No answer, unable to leave voicemail.  Will try again later.

## 2020-07-19 NOTE — Telephone Encounter (Signed)
Faxed lab results w/provider comments to number provided [phone]

## 2020-12-11 ENCOUNTER — Other Ambulatory Visit: Payer: Self-pay

## 2020-12-11 ENCOUNTER — Emergency Department (HOSPITAL_COMMUNITY): Payer: Medicaid Other

## 2020-12-11 ENCOUNTER — Inpatient Hospital Stay (HOSPITAL_COMMUNITY)
Admission: EM | Admit: 2020-12-11 | Discharge: 2020-12-15 | DRG: 389 | Disposition: A | Discharge status: Skilled Nursing Facility | Payer: Medicaid Other | Source: Skilled Nursing Facility | Attending: Internal Medicine | Admitting: Internal Medicine

## 2020-12-11 ENCOUNTER — Encounter (HOSPITAL_COMMUNITY): Payer: Self-pay | Admitting: Emergency Medicine

## 2020-12-11 DIAGNOSIS — E785 Hyperlipidemia, unspecified: Secondary | ICD-10-CM | POA: Diagnosis present

## 2020-12-11 DIAGNOSIS — E1169 Type 2 diabetes mellitus with other specified complication: Secondary | ICD-10-CM | POA: Diagnosis present

## 2020-12-11 DIAGNOSIS — K56609 Unspecified intestinal obstruction, unspecified as to partial versus complete obstruction: Secondary | ICD-10-CM

## 2020-12-11 DIAGNOSIS — I69091 Dysphagia following nontraumatic subarachnoid hemorrhage: Secondary | ICD-10-CM

## 2020-12-11 DIAGNOSIS — Z87891 Personal history of nicotine dependence: Secondary | ICD-10-CM

## 2020-12-11 DIAGNOSIS — R1084 Generalized abdominal pain: Secondary | ICD-10-CM

## 2020-12-11 DIAGNOSIS — K56600 Partial intestinal obstruction, unspecified as to cause: Secondary | ICD-10-CM | POA: Diagnosis present

## 2020-12-11 DIAGNOSIS — G40909 Epilepsy, unspecified, not intractable, without status epilepticus: Secondary | ICD-10-CM | POA: Diagnosis present

## 2020-12-11 DIAGNOSIS — E860 Dehydration: Secondary | ICD-10-CM | POA: Diagnosis present

## 2020-12-11 DIAGNOSIS — Z8249 Family history of ischemic heart disease and other diseases of the circulatory system: Secondary | ICD-10-CM

## 2020-12-11 DIAGNOSIS — R569 Unspecified convulsions: Secondary | ICD-10-CM

## 2020-12-11 DIAGNOSIS — I10 Essential (primary) hypertension: Secondary | ICD-10-CM | POA: Diagnosis present

## 2020-12-11 DIAGNOSIS — Z993 Dependence on wheelchair: Secondary | ICD-10-CM

## 2020-12-11 DIAGNOSIS — I110 Hypertensive heart disease with heart failure: Secondary | ICD-10-CM | POA: Diagnosis present

## 2020-12-11 DIAGNOSIS — K5641 Fecal impaction: Secondary | ICD-10-CM

## 2020-12-11 DIAGNOSIS — Z79899 Other long term (current) drug therapy: Secondary | ICD-10-CM

## 2020-12-11 DIAGNOSIS — I69054 Hemiplegia and hemiparesis following nontraumatic subarachnoid hemorrhage affecting left non-dominant side: Secondary | ICD-10-CM

## 2020-12-11 DIAGNOSIS — Z86711 Personal history of pulmonary embolism: Secondary | ICD-10-CM

## 2020-12-11 DIAGNOSIS — E861 Hypovolemia: Secondary | ICD-10-CM | POA: Diagnosis not present

## 2020-12-11 DIAGNOSIS — I5042 Chronic combined systolic (congestive) and diastolic (congestive) heart failure: Secondary | ICD-10-CM | POA: Diagnosis present

## 2020-12-11 DIAGNOSIS — Z86718 Personal history of other venous thrombosis and embolism: Secondary | ICD-10-CM

## 2020-12-11 DIAGNOSIS — K589 Irritable bowel syndrome without diarrhea: Secondary | ICD-10-CM | POA: Diagnosis present

## 2020-12-11 DIAGNOSIS — I420 Dilated cardiomyopathy: Secondary | ICD-10-CM | POA: Diagnosis present

## 2020-12-11 DIAGNOSIS — Z931 Gastrostomy status: Secondary | ICD-10-CM

## 2020-12-11 DIAGNOSIS — R1310 Dysphagia, unspecified: Secondary | ICD-10-CM

## 2020-12-11 DIAGNOSIS — R52 Pain, unspecified: Secondary | ICD-10-CM

## 2020-12-11 DIAGNOSIS — Z20822 Contact with and (suspected) exposure to covid-19: Secondary | ICD-10-CM | POA: Diagnosis present

## 2020-12-11 DIAGNOSIS — Z7901 Long term (current) use of anticoagulants: Secondary | ICD-10-CM

## 2020-12-11 DIAGNOSIS — E872 Acidosis, unspecified: Secondary | ICD-10-CM | POA: Diagnosis present

## 2020-12-11 DIAGNOSIS — I82409 Acute embolism and thrombosis of unspecified deep veins of unspecified lower extremity: Secondary | ICD-10-CM | POA: Diagnosis present

## 2020-12-11 DIAGNOSIS — D72829 Elevated white blood cell count, unspecified: Secondary | ICD-10-CM | POA: Diagnosis present

## 2020-12-11 DIAGNOSIS — K567 Ileus, unspecified: Secondary | ICD-10-CM | POA: Diagnosis present

## 2020-12-11 DIAGNOSIS — E870 Hyperosmolality and hypernatremia: Secondary | ICD-10-CM | POA: Diagnosis not present

## 2020-12-11 LAB — CBC WITH DIFFERENTIAL/PLATELET
Abs Immature Granulocytes: 0.05 10*3/uL (ref 0.00–0.07)
Basophils Absolute: 0 10*3/uL (ref 0.0–0.1)
Basophils Relative: 0 %
Eosinophils Absolute: 0 10*3/uL (ref 0.0–0.5)
Eosinophils Relative: 0 %
HCT: 44.5 % (ref 36.0–46.0)
Hemoglobin: 14.1 g/dL (ref 12.0–15.0)
Immature Granulocytes: 0 %
Lymphocytes Relative: 10 %
Lymphs Abs: 1.4 10*3/uL (ref 0.7–4.0)
MCH: 29.4 pg (ref 26.0–34.0)
MCHC: 31.7 g/dL (ref 30.0–36.0)
MCV: 92.9 fL (ref 80.0–100.0)
Monocytes Absolute: 0.8 10*3/uL (ref 0.1–1.0)
Monocytes Relative: 6 %
Neutro Abs: 11.3 10*3/uL — ABNORMAL HIGH (ref 1.7–7.7)
Neutrophils Relative %: 84 %
Platelets: 291 10*3/uL (ref 150–400)
RBC: 4.79 MIL/uL (ref 3.87–5.11)
RDW: 14.8 % (ref 11.5–15.5)
WBC: 13.7 10*3/uL — ABNORMAL HIGH (ref 4.0–10.5)
nRBC: 0 % (ref 0.0–0.2)

## 2020-12-11 MED ORDER — FENTANYL CITRATE PF 50 MCG/ML IJ SOSY
50.0000 ug | PREFILLED_SYRINGE | Freq: Once | INTRAMUSCULAR | Status: AC
Start: 2020-12-11 — End: 2020-12-11
  Administered 2020-12-11: 50 ug via INTRAVENOUS
  Filled 2020-12-11: qty 1

## 2020-12-11 MED ORDER — ONDANSETRON HCL 4 MG/2ML IJ SOLN
4.0000 mg | Freq: Once | INTRAMUSCULAR | Status: AC | PRN
  Administered 2020-12-11: 4 mg via INTRAVENOUS
  Filled 2020-12-11: qty 2

## 2020-12-11 MED ORDER — SODIUM CHLORIDE 0.9 % IV BOLUS
1000.0000 mL | Freq: Once | INTRAVENOUS | Status: AC
  Administered 2020-12-11: 1000 mL via INTRAVENOUS

## 2020-12-11 MED ORDER — DIATRIZOATE MEGLUMINE & SODIUM 66-10 % PO SOLN
30.0000 mL | Freq: Once | ORAL | Status: AC
  Administered 2020-12-11: 30 mL

## 2020-12-11 NOTE — ED Triage Notes (Signed)
Patient arrives complaining of abdominal pain. Patient had PEG tube replaced today. Patient had x2 episodes of vomiting. Patient gets feedings through her PEG. Patient's feed stopped at 2000 tonight due to symptoms.

## 2020-12-11 NOTE — ED Provider Notes (Signed)
[facility] DEPT Provider Note   CSN: [id number] Arrival date & time: 12/11/20  2211     History Chief Complaint  Patient presents with   Abdominal Pain    Rachel Vang is a 55 y.o. female.  HPI Patient arrives from her nursing care facility for evaluation of abdominal pain.  She is nonverbal and cannot give direct history.  She reportedly had a PEG tube replaced today.   Level 5 caveat-nonverbal  Past Medical History:  Diagnosis Date   Acute pulmonary embolism ([place]) 02/19/2015   Acute respiratory failure (HCC)    Diabetes mellitus without complication ([place])    Type 2, W/o complications   DVT (deep venous thrombosis) ([place]) 04/05/2015   Dysphagia    Epilepsy (HCC)    GERD (gastroesophageal reflux disease)    Hyperlipidemia    Hypertension    IBS (irritable bowel syndrome)    Nontraumatic subarachnoid hemorrhage (HCC)    SAH (subarachnoid hemorrhage) ([place])    Urinary retention     Patient Active Problem List   Diagnosis Date Noted   DCM (dilated cardiomyopathy) ([place])    Pericardial effusion    Malnutrition of moderate degree 04/05/2020   Aspiration pneumonia ([place]) 04/05/2020   Sepsis due to pneumonia ([place]) 04/04/2020   Hypokalemia 04/04/2020   Prolonged QT interval 04/04/2020   Hemiplegia as late effect of cerebrovascular accident (CVA) ([place]) 09/19/2017   Fever 09/06/2017   Slow transit constipation 06/08/2017   Insomnia 06/08/2017   Generalized weakness 06/07/2017   Aspiration pneumonia of both lower lobes due to gastric secretions (HCC)    Demand ischemia (HCC)    Chronic pain of right knee    Chronic heel pain, right    Small bowel obstruction (HCC)    Inappropriate sinus tachycardia    AKI (acute kidney injury) ([place]) 05/06/2017   Hypernatremia 05/06/2017   Dyslipidemia associated with type 2 diabetes mellitus ([place]) 04/27/2017   Gingivitis, acute, plaque induced 03/26/2017   Hypersecretion of saliva 03/14/2017    Bruxism (teeth grinding) 03/14/2017   Lobar pneumonia ([place])    Acute on chronic respiratory failure with hypoxia (HCC)    Pressure injury of skin 02/16/2017   Foot drop, right foot 10/18/2016   CVA (cerebrovascular accident) ([place]) 08/30/2016   GERD without esophagitis 08/30/2016   Chronic pulmonary embolism ([place]) 07/03/2016   Weight loss, non-intentional 06/13/2016   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   Essential hypertension, benign 05/20/2015   Status post insertion of percutaneous endoscopic gastrostomy (PEG) tube ([place]) 05/20/2015   DVT (deep venous thrombosis) ([place]) 04/05/2015   Protein-calorie malnutrition, severe ([place]) 03/25/2015   Dysphagia 02/18/2015   Seizures ([place]) 02/18/2015   Elevated troponin 02/18/2015   Urine retention 02/18/2015   History of ETT    Subarachnoid hemorrhage ([place])     Past Surgical History:  Procedure Laterality Date   ABDOMINAL SURGERY     ANEURYSM COILING     COLONOSCOPY N/A 02/20/2015   Procedure: COLONOSCOPY;  Surgeon: Gatha Mayer, MD;  Location: [facility];  Service: Endoscopy;  Laterality: N/A;   ESOPHAGOGASTRODUODENOSCOPY (EGD) WITH PROPOFOL N/A 02/02/2015   Procedure: ESOPHAGOGASTRODUODENOSCOPY (EGD) WITH PROPOFOL;  Surgeon: Judeth Horn, MD;  Location: [facility];  Service: General;  Laterality: N/A;   IR GASTROSTOMY TUBE MOD SED  04/13/2020   IR GENERIC HISTORICAL  08/26/2015   IR GASTRIC TUBE PERC CHG W/O IMG GUIDE 08/26/2015 Aletta Edouard, MD [facility]   IR GENERIC HISTORICAL  09/14/2015  IR REPLC GASTRO/COLONIC TUBE PERCUT W/FLUORO 09/14/2015 Darrell K Allred, PA-C [facility]   IR REPLACE G-TUBE SIMPLE WO FLUORO  06/08/2016   IR REPLACE G-TUBE SIMPLE WO FLUORO  10/24/2016   IR REPLACE G-TUBE SIMPLE WO FLUORO  01/07/2017   PEG PLACEMENT N/A 02/02/2015   Procedure: PERCUTANEOUS ENDOSCOPIC GASTROSTOMY (PEG) PLACEMENT;  Surgeon: Judeth Horn, MD;  Location: [facility];  Service: General;   Laterality: N/A;   RADIOLOGY WITH ANESTHESIA N/A 01/13/2015   Procedure: RADIOLOGY WITH ANESTHESIA;  Surgeon: Consuella Lose, MD;  Location: [facility];  Service: Radiology;  Laterality: N/A;   TRACHEOSTOMY       OB History   No obstetric history on file.     Family History  Problem Relation Age of Onset   Hypertension Other     Social History   Tobacco Use   Smoking status: Former   Smokeless tobacco: Never  Substance Use Topics   Alcohol use: No   Drug use: No    Home Medications Prior to Admission medications   Medication Sig Start Date End Date Taking? Authorizing Provider  acetaminophen (TYLENOL) 325 MG tablet Take 650 mg by mouth every 6 (six) hours.    [provider]  bethanechol (URECHOLINE) 10 MG tablet Take 10 mg by mouth 3 (three) times daily. 09/08/17   [provider]  docusate sodium (COLACE) 100 MG capsule Take 100 mg by mouth daily.    [provider]  gabapentin (NEURONTIN) 100 MG capsule Take 200 mg by mouth 2 (two) times daily. 09/08/17   Almyra Free, MD  guaiFENesin (MUCINEX) 600 MG 12 hr tablet Take 600 mg by mouth every 6 (six) hours as needed for cough.    [provider]  levETIRAcetam (KEPPRA) 100 MG/ML solution Take 7.5 mLs (750 mg total) by mouth 2 (two) times daily. 04/14/20   Oswald Hillock, MD  losartan (COZAAR) 25 MG tablet Take 1 tablet (25 mg total) by mouth daily. 04/14/20   Oswald Hillock, MD  metoprolol tartrate (LOPRESSOR) 25 mg/10 mL SUSP Take 20 mLs (50 mg total) by mouth 2 (two) times daily. 04/14/20   Oswald Hillock, MD  mirtazapine (REMERON) 15 MG tablet Take 15 mg by mouth at bedtime.    [provider]  NON FORMULARY Apply 1 application topically 2 (two) times daily. Hand palm protector    [provider]  Nutritional Supplements (NUTRITIONAL SUPPLEMENT PO) Take 237 mLs by mouth 2 (two) times daily. Ensure    [provider]  polyethylene glycol (MIRALAX / GLYCOLAX) packet  Take 17 g by mouth daily.    [provider]  Probiotic Product (PROBIOTIC DAILY PO) Take 1 capsule by mouth 2 (two) times daily. 09/08/17   [provider]  rivaroxaban (XARELTO) 20 MG TABS tablet Take 1 tablet (20 mg total) by mouth at bedtime. 04/15/20   Oswald Hillock, MD  rosuvastatin (CRESTOR) 5 MG tablet Take 5 mg by mouth daily. 09/24/17   [provider]  sertraline (ZOLOFT) 50 MG tablet Take 50 mg by mouth daily.    [provider]  traZODone (DESYREL) 100 MG tablet Take 100 mg by mouth at bedtime.    [provider]  urea (CARMOL) 40 % CREA Apply 1 application topically daily. To both feet    [provider]    Allergies    Patient has no known allergies.  Review of Systems   Review of Systems  Unable to perform ROS: Other  Physical Exam Updated Vital Signs BP (!) 126/98   Pulse (!) 123   Temp 98.6 F (37 C) (Oral)   Resp 16   LMP  (LMP Unknown)   SpO2 94%   Physical Exam Vitals and nursing note reviewed.  Constitutional:      General: She is not in acute distress.    Appearance: She is well-developed. She is not ill-appearing, toxic-appearing or diaphoretic.  HENT:     Head: Normocephalic and atraumatic.     Right Ear: External ear normal.     Left Ear: External ear normal.     Mouth/Throat:     Pharynx: No pharyngeal swelling or oropharyngeal exudate.  Eyes:     Conjunctiva/sclera: Conjunctivae normal.     Pupils: Pupils are equal, round, and reactive to light.  Neck:     Trachea: Phonation normal.  Cardiovascular:     Rate and Rhythm: Regular rhythm. Tachycardia present.     Heart sounds: Normal heart sounds.  Pulmonary:     Effort: Pulmonary effort is normal.     Breath sounds: Normal breath sounds.  Abdominal:     General: There is no distension.     Palpations: Abdomen is soft. There is no mass.     Tenderness: There is abdominal tenderness (Moderate).     Hernia: No hernia is present.      Comments: G-tube left upper quadrant, site and appliance appear normal.  There is feeding material in the tube attached to the PEG.  Musculoskeletal:        General: Normal range of motion.     Cervical back: Normal range of motion and neck supple.  Skin:    General: Skin is warm and dry.     Coloration: Skin is not jaundiced or pale.  Neurological:     Mental Status: She is alert.     Cranial Nerves: No cranial nerve deficit.     Sensory: No sensory deficit.     Motor: No abnormal muscle tone.     Coordination: Coordination normal.     Comments: Alert, cooperative, responsive.  Nonverbal.  Psychiatric:        Mood and Affect: Mood normal.        Behavior: Behavior normal.    ED Results / Procedures / Treatments   Labs (all labs ordered are listed, but only abnormal results are displayed) Labs Reviewed  RESP PANEL BY RT-PCR (FLU A&B, COVID) ARPGX2  LACTIC ACID, PLASMA  LACTIC ACID, PLASMA  COMPREHENSIVE METABOLIC PANEL  CBC WITH DIFFERENTIAL/PLATELET  URINALYSIS, ROUTINE W REFLEX MICROSCOPIC  LIPASE, BLOOD    EKG None  Radiology No results found.  Procedures Procedures   Medications Ordered in ED Medications  fentaNYL (SUBLIMAZE) injection 50 mcg (has no administration in time range)  ondansetron (ZOFRAN) injection 4 mg (4 mg Intravenous Given 12/11/20 2305)  sodium chloride 0.9 % bolus 1,000 mL (1,000 mLs Intravenous New Bag/Given 12/11/20 2305)  diatrizoate meglumine-sodium (GASTROGRAFIN) 66-10 % solution 30 mL (30 mLs Per Tube Given 12/11/20 2315)    ED Course  I have reviewed the triage vital signs and the nursing notes.  Pertinent labs & imaging results that were available during my care of the patient were reviewed by me and considered in my medical decision making (see chart for details).  Clinical Course as of 12/11/20 2321  Wynelle Link Dec 11, 2020  2319 Peg tube assessed with radiography using Gastrografin, normal gastric pattern seen. [EW]    Clinical  Course User  Index [EW] Daleen Bo, MD   MDM Rules/Calculators/A&P                            Patient Vitals for the past 24 hrs:  BP Temp Temp src Pulse Resp SpO2  12/11/20 2300 (!) 126/98 -- -- (!) 123 16 94 %  12/11/20 2230 (!) 114/97 -- -- (!) 125 15 95 %  12/11/20 2224 (!) 114/94 98.6 F (37 C) Oral (!) 124 16 96 %    10:41 PM Reevaluation with update and discussion. After initial assessment and treatment, an updated evaluation reveals no change in clinical status. Daleen Bo   Medical Decision Making:  This patient is presenting for evaluation of abdominal pain, which does require a range of treatment options, and is a complaint that involves a moderate risk of morbidity and mortality. The differential diagnoses include complications from replacement of G-tube.  Acute infection, dehydration, metabolic disorder. I decided to review old records, and in summary elderly female with history of subarachnoid hemorrhage, debilitated, lives in a skilled nursing facility.  She has history of protein calorie malnutrition and requests enteral feedings.  Also history of diabetes, weight loss, small bowel obstruction and metabolic disorders..    Clinical Laboratory Tests Ordered, included CBC and Metabolic panel.  Viral panel. Radiologic Tests Ordered, included CT abdomen pelvis.      Critical Interventions-clinical evaluation, radiography, laboratory testing, CT imaging ordered, IV fluids given, pain medicines ordered.  Observation  After These Interventions, the Patient was reevaluated and was found with nonspecific abdominal pain after having PEG tube replaced.  PEG tube appears normal and Gastrografin instillation is reassuring.  CT ordered to evaluate for other acute intra-abdominal processes.  CRITICAL CARE-no Performed by: Daleen Bo  Nursing Notes Reviewed/ Care Coordinated Applicable Imaging Reviewed Interpretation of Laboratory Data incorporated into ED  treatment  Care to Dr. Florina Ou to evaluate after return of laboratory testing and CT imaging.   Final Clinical Impression(s) / ED Diagnoses Final diagnoses:  Pain  Generalized abdominal pain    Rx / DC Orders ED Discharge Orders     None        Daleen Bo, MD 12/11/20 2323

## 2020-12-12 ENCOUNTER — Inpatient Hospital Stay (HOSPITAL_COMMUNITY): Payer: Medicaid Other

## 2020-12-12 ENCOUNTER — Emergency Department (HOSPITAL_COMMUNITY): Payer: Medicaid Other

## 2020-12-12 DIAGNOSIS — E870 Hyperosmolality and hypernatremia: Secondary | ICD-10-CM | POA: Diagnosis not present

## 2020-12-12 DIAGNOSIS — I5042 Chronic combined systolic (congestive) and diastolic (congestive) heart failure: Secondary | ICD-10-CM | POA: Diagnosis present

## 2020-12-12 DIAGNOSIS — Z20822 Contact with and (suspected) exposure to covid-19: Secondary | ICD-10-CM | POA: Diagnosis present

## 2020-12-12 DIAGNOSIS — Z8249 Family history of ischemic heart disease and other diseases of the circulatory system: Secondary | ICD-10-CM | POA: Diagnosis not present

## 2020-12-12 DIAGNOSIS — I10 Essential (primary) hypertension: Secondary | ICD-10-CM | POA: Diagnosis not present

## 2020-12-12 DIAGNOSIS — E860 Dehydration: Secondary | ICD-10-CM | POA: Diagnosis present

## 2020-12-12 DIAGNOSIS — K56600 Partial intestinal obstruction, unspecified as to cause: Secondary | ICD-10-CM | POA: Diagnosis present

## 2020-12-12 DIAGNOSIS — Z87891 Personal history of nicotine dependence: Secondary | ICD-10-CM | POA: Diagnosis not present

## 2020-12-12 DIAGNOSIS — R1310 Dysphagia, unspecified: Secondary | ICD-10-CM | POA: Diagnosis present

## 2020-12-12 DIAGNOSIS — K589 Irritable bowel syndrome without diarrhea: Secondary | ICD-10-CM | POA: Diagnosis present

## 2020-12-12 DIAGNOSIS — Z86711 Personal history of pulmonary embolism: Secondary | ICD-10-CM | POA: Diagnosis not present

## 2020-12-12 DIAGNOSIS — Z86718 Personal history of other venous thrombosis and embolism: Secondary | ICD-10-CM | POA: Diagnosis not present

## 2020-12-12 DIAGNOSIS — E1169 Type 2 diabetes mellitus with other specified complication: Secondary | ICD-10-CM | POA: Diagnosis present

## 2020-12-12 DIAGNOSIS — I69054 Hemiplegia and hemiparesis following nontraumatic subarachnoid hemorrhage affecting left non-dominant side: Secondary | ICD-10-CM | POA: Diagnosis not present

## 2020-12-12 DIAGNOSIS — Z7901 Long term (current) use of anticoagulants: Secondary | ICD-10-CM | POA: Diagnosis not present

## 2020-12-12 DIAGNOSIS — G40909 Epilepsy, unspecified, not intractable, without status epilepticus: Secondary | ICD-10-CM | POA: Diagnosis present

## 2020-12-12 DIAGNOSIS — K567 Ileus, unspecified: Secondary | ICD-10-CM | POA: Diagnosis present

## 2020-12-12 DIAGNOSIS — E861 Hypovolemia: Secondary | ICD-10-CM | POA: Diagnosis not present

## 2020-12-12 DIAGNOSIS — E785 Hyperlipidemia, unspecified: Secondary | ICD-10-CM | POA: Diagnosis present

## 2020-12-12 DIAGNOSIS — Z79899 Other long term (current) drug therapy: Secondary | ICD-10-CM | POA: Diagnosis not present

## 2020-12-12 DIAGNOSIS — I420 Dilated cardiomyopathy: Secondary | ICD-10-CM | POA: Diagnosis present

## 2020-12-12 DIAGNOSIS — I69091 Dysphagia following nontraumatic subarachnoid hemorrhage: Secondary | ICD-10-CM | POA: Diagnosis not present

## 2020-12-12 DIAGNOSIS — R109 Unspecified abdominal pain: Secondary | ICD-10-CM | POA: Diagnosis present

## 2020-12-12 DIAGNOSIS — I110 Hypertensive heart disease with heart failure: Secondary | ICD-10-CM | POA: Diagnosis present

## 2020-12-12 DIAGNOSIS — D72829 Elevated white blood cell count, unspecified: Secondary | ICD-10-CM | POA: Diagnosis present

## 2020-12-12 DIAGNOSIS — E872 Acidosis, unspecified: Secondary | ICD-10-CM | POA: Diagnosis present

## 2020-12-12 LAB — BASIC METABOLIC PANEL
Anion gap: 10 (ref 5–15)
BUN: 39 mg/dL — ABNORMAL HIGH (ref 6–20)
CO2: 28 mmol/L (ref 22–32)
Calcium: 8.7 mg/dL — ABNORMAL LOW (ref 8.9–10.3)
Chloride: 113 mmol/L — ABNORMAL HIGH (ref 98–111)
Creatinine, Ser: 0.83 mg/dL (ref 0.44–1.00)
GFR, Estimated: >60 mL/min (ref >60)
Glucose, Bld: 98 mg/dL (ref 70–99)
Potassium: 4.1 mmol/L (ref 3.5–5.1)
Sodium: 151 mmol/L — ABNORMAL HIGH (ref 135–145)

## 2020-12-12 LAB — HEMOGLOBIN A1C
Hgb A1c MFr Bld: 6 % — ABNORMAL HIGH (ref 4.8–5.6)
Mean Plasma Glucose: 126 mg/dL

## 2020-12-12 LAB — COMPREHENSIVE METABOLIC PANEL
ALT: 13 U/L (ref 0–44)
AST: 19 U/L (ref 15–41)
Albumin: 4.6 g/dL (ref 3.5–5.0)
Alkaline Phosphatase: 110 U/L (ref 38–126)
Anion gap: 13 (ref 5–15)
BUN: 51 mg/dL — ABNORMAL HIGH (ref 6–20)
CO2: 28 mmol/L (ref 22–32)
Calcium: 10.6 mg/dL — ABNORMAL HIGH (ref 8.9–10.3)
Chloride: 105 mmol/L (ref 98–111)
Creatinine, Ser: 0.92 mg/dL (ref 0.44–1.00)
GFR, Estimated: >60 mL/min (ref >60)
Glucose, Bld: 130 mg/dL — ABNORMAL HIGH (ref 70–99)
Potassium: 3.8 mmol/L (ref 3.5–5.1)
Sodium: 146 mmol/L — ABNORMAL HIGH (ref 135–145)
Total Bilirubin: 0.4 mg/dL (ref 0.3–1.2)
Total Protein: 9.6 g/dL — ABNORMAL HIGH (ref 6.5–8.1)

## 2020-12-12 LAB — CBC
HCT: 38.7 % (ref 36.0–46.0)
Hemoglobin: 12 g/dL (ref 12.0–15.0)
MCH: 30.2 pg (ref 26.0–34.0)
MCHC: 31 g/dL (ref 30.0–36.0)
MCV: 97.2 fL (ref 80.0–100.0)
Platelets: 226 10*3/uL (ref 150–400)
RBC: 3.98 MIL/uL (ref 3.87–5.11)
RDW: 14.9 % (ref 11.5–15.5)
WBC: 13.6 10*3/uL — ABNORMAL HIGH (ref 4.0–10.5)
nRBC: 0 % (ref 0.0–0.2)

## 2020-12-12 LAB — URINALYSIS, ROUTINE W REFLEX MICROSCOPIC
Bacteria, UA: NONE SEEN
Bilirubin Urine: NEGATIVE
Glucose, UA: NEGATIVE mg/dL
Hgb urine dipstick: NEGATIVE
Ketones, ur: NEGATIVE mg/dL
Leukocytes,Ua: NEGATIVE
Nitrite: NEGATIVE
Protein, ur: 100 mg/dL — AB
Specific Gravity, Urine: 1.029 (ref 1.005–1.030)
pH: 5 (ref 5.0–8.0)

## 2020-12-12 LAB — GLUCOSE, CAPILLARY
Glucose-Capillary: 88 mg/dL (ref 70–99)
Glucose-Capillary: 100 mg/dL — ABNORMAL HIGH (ref 70–99)
Glucose-Capillary: 95 mg/dL (ref 70–99)
Glucose-Capillary: 90 mg/dL (ref 70–99)
Glucose-Capillary: 84 mg/dL (ref 70–99)

## 2020-12-12 LAB — RESP PANEL BY RT-PCR (FLU A&B, COVID) ARPGX2
Influenza A by PCR: NEGATIVE
Influenza B by PCR: NEGATIVE
SARS Coronavirus 2 by RT PCR: NEGATIVE

## 2020-12-12 LAB — MRSA NEXT GEN BY PCR, NASAL: MRSA by PCR Next Gen: DETECTED — AB

## 2020-12-12 LAB — LACTIC ACID, PLASMA
Lactic Acid, Venous: 3.8 mmol/L (ref 0.5–1.9)
Lactic Acid, Venous: 3.6 mmol/L (ref 0.5–1.9)

## 2020-12-12 LAB — LIPASE, BLOOD: Lipase: 62 U/L — ABNORMAL HIGH (ref 11–51)

## 2020-12-12 MED ORDER — SODIUM CHLORIDE 0.9 % IV SOLN
12.5000 mg | Freq: Four times a day (QID) | INTRAVENOUS | Status: DC | PRN
  Administered 2020-12-13: 12.5 mg via INTRAVENOUS
  Filled 2020-12-12: qty 12.5
  Filled 2020-12-12: qty 0.5

## 2020-12-12 MED ORDER — FLEET ENEMA 7-19 GM/118ML RE ENEM
1.0000 | ENEMA | Freq: Once | RECTAL | Status: AC
  Administered 2020-12-12: 1 via RECTAL
  Filled 2020-12-12: qty 1

## 2020-12-12 MED ORDER — SODIUM CHLORIDE 0.9 % IV SOLN: INTRAVENOUS | Status: AC

## 2020-12-12 MED ORDER — SODIUM CHLORIDE 0.9 % IV SOLN
750.0000 mg | Freq: Two times a day (BID) | INTRAVENOUS | Status: DC
  Administered 2020-12-12 – 2020-12-14 (×5): 750 mg via INTRAVENOUS
  Filled 2020-12-12 (×5): qty 7.5

## 2020-12-12 MED ORDER — INSULIN ASPART 100 UNIT/ML IJ SOLN: 0.0000 [IU] | INTRAMUSCULAR | Status: DC

## 2020-12-12 MED ORDER — IOHEXOL 350 MG/ML SOLN
80.0000 mL | Freq: Once | INTRAVENOUS | Status: AC | PRN
  Administered 2020-12-12: 80 mL via INTRAVENOUS

## 2020-12-12 MED ORDER — SODIUM CHLORIDE 0.9 % IV BOLUS
1000.0000 mL | Freq: Once | INTRAVENOUS | Status: AC
  Administered 2020-12-12: 1000 mL via INTRAVENOUS

## 2020-12-12 MED ORDER — CHLORHEXIDINE GLUCONATE CLOTH 2 % EX PADS
6.0000 | MEDICATED_PAD | Freq: Every day | CUTANEOUS | Status: DC
  Administered 2020-12-12 – 2020-12-15 (×4): 6 via TOPICAL

## 2020-12-12 MED ORDER — FENTANYL CITRATE PF 50 MCG/ML IJ SOSY
50.0000 ug | PREFILLED_SYRINGE | INTRAMUSCULAR | Status: DC | PRN
  Administered 2020-12-12: 50 ug via INTRAVENOUS
  Filled 2020-12-12: qty 1

## 2020-12-12 MED ORDER — MUPIROCIN 2 % EX OINT
1.0000 "application " | TOPICAL_OINTMENT | Freq: Two times a day (BID) | CUTANEOUS | Status: DC
  Administered 2020-12-12 – 2020-12-15 (×6): 1 via NASAL
  Filled 2020-12-12: qty 22

## 2020-12-12 MED ORDER — CHLORHEXIDINE GLUCONATE CLOTH 2 % EX PADS: 6.0000 | MEDICATED_PAD | Freq: Every day | CUTANEOUS | Status: DC

## 2020-12-12 NOTE — Consult Note (Signed)
[facility] Consult Note  Rachel Vang [birth year]  [id number].    Requesting MD: Jacquelin Hawking Chief Complaint/Reason for Consult: SBO  HPI:  Rachel Vang is a 55yo female with multiple medical issues including h/o seizure disorder, PE/DVT on eliquis, HTN, HLD, DM, CHF (EF 45-50% on ECHO 05/2020), nontraumatic SAH, left hemiparesis, nonambulatory/ wheelchair bound who presented to [facility] yesterday complaining of abdominal pain. Patient is a difficult historian, therefore some of the information in this HPI was taken from the chart and some from the patient.  Patient developed diffuse abdominal pain yesterday evening after her PEG tube was replaced earlier in the day. Associated with multiple episodes of nausea and vomiting. She currently has no abdominal pain, and had a BM last night. Not passing any flatus.  CT scan on admission showed dilated loops of small bowel in the left upper abdomen with possible transition in the left mid abdomen, adynamic small bowel ileus felt to be less likely.  Patient was admitted to the medical service. G tube has been placed to gravity. General surgery asked to see.  Other than PEG tube she has had no other abdominal surgeries/ procedures  Review of Systems  Unable to perform ROS: Mental acuity    Family History  Problem Relation Age of Onset   Hypertension Other     Past Medical History:  Diagnosis Date   Acute pulmonary embolism (HCC) 02/19/2015   Acute respiratory failure (HCC)    Diabetes mellitus without complication (HCC)    Type 2, W/o complications   DVT (deep venous thrombosis) (HCC) 04/05/2015   Dysphagia    Epilepsy (HCC)    GERD (gastroesophageal reflux disease)    Hyperlipidemia    Hypertension    IBS (irritable bowel syndrome)    Nontraumatic subarachnoid hemorrhage (HCC)    SAH (subarachnoid hemorrhage) (HCC)    Urinary retention     Past Surgical History:  Procedure Laterality Date   ABDOMINAL SURGERY      ANEURYSM COILING     COLONOSCOPY N/A 02/20/2015   Procedure: COLONOSCOPY;  Surgeon: Iva Boop, MD;  Location: [facility] ENDOSCOPY;  Service: Endoscopy;  Laterality: N/A;   ESOPHAGOGASTRODUODENOSCOPY (EGD) WITH PROPOFOL N/A 02/02/2015   Procedure: ESOPHAGOGASTRODUODENOSCOPY (EGD) WITH PROPOFOL;  Surgeon: Jimmye Norman, MD;  Location: [facility] ENDOSCOPY;  Service: General;  Laterality: N/A;   IR GASTROSTOMY TUBE MOD SED  04/13/2020   IR GENERIC HISTORICAL  08/26/2015   IR GASTRIC TUBE PERC CHG W/O IMG GUIDE 08/26/2015 Irish Lack, MD [facility]-INTERV RAD   IR GENERIC HISTORICAL  09/14/2015   IR REPLC GASTRO/COLONIC TUBE PERCUT W/FLUORO 09/14/2015 Darrell K Allred, PA-C [facility]-INTERV RAD   IR REPLACE G-TUBE SIMPLE WO FLUORO  06/08/2016   IR REPLACE G-TUBE SIMPLE WO FLUORO  10/24/2016   IR REPLACE G-TUBE SIMPLE WO FLUORO  01/07/2017   PEG PLACEMENT N/A 02/02/2015   Procedure: PERCUTANEOUS ENDOSCOPIC GASTROSTOMY (PEG) PLACEMENT;  Surgeon: Jimmye Norman, MD;  Location: [facility] ENDOSCOPY;  Service: General;  Laterality: N/A;   RADIOLOGY WITH ANESTHESIA N/A 01/13/2015   Procedure: RADIOLOGY WITH ANESTHESIA;  Surgeon: Lisbeth Renshaw, MD;  Location: [facility] OR;  Service: Radiology;  Laterality: N/A;   TRACHEOSTOMY      Social History:  reports that she has quit smoking. She has never used smokeless tobacco. She reports that she does not drink alcohol and does not use drugs.  Allergies: No Known Allergies  Medications Prior to Admission  Medication Sig Dispense Refill   acetaminophen (TYLENOL) 325 MG tablet  Take 650 mg by mouth every 6 (six) hours.     apixaban (ELIQUIS) 5 MG TABS tablet Take 5 mg by mouth daily.     bethanechol (URECHOLINE) 10 MG tablet Take 10 mg by mouth 3 (three) times daily.     docusate sodium (COLACE) 100 MG capsule Take 100 mg by mouth daily.     gabapentin (NEURONTIN) 100 MG capsule Take 200 mg by mouth 2 (two) times daily.     levETIRAcetam (KEPPRA) 100 MG/ML solution Take 7.5 mLs (750 mg total) by mouth 2  (two) times daily. 473 mL 12   losartan (COZAAR) 25 MG tablet Take 1 tablet (25 mg total) by mouth daily. 30 tablet 2   melatonin 3 MG TABS tablet Take 6 mg by mouth at bedtime.     metoprolol tartrate (LOPRESSOR) 25 mg/10 mL SUSP Take 20 mLs (50 mg total) by mouth 2 (two) times daily. 100 mL 2   NON FORMULARY Apply 1 application topically 2 (two) times daily. Hand palm protector     Nutritional Supplements (FEEDING SUPPLEMENT, JEVITY 1.2 CAL,) LIQD Place 1,000 mLs into feeding tube continuous. 55ml per hour per G tube continuously- start infusion daily at 0700 and stop at 2300     Nutritional Supplements (NUTRITIONAL SUPPLEMENT PO) Take 237 mLs by mouth 2 (two) times daily. Ensure     polyethylene glycol (MIRALAX / GLYCOLAX) packet Take 17 g by mouth daily.     Probiotic Product (PROBIOTIC DAILY PO) Take 1 capsule by mouth 2 (two) times daily.     rosuvastatin (CRESTOR) 5 MG tablet Take 5 mg by mouth daily.     sertraline (ZOLOFT) 50 MG tablet Take 50 mg by mouth daily.     traZODone (DESYREL) 100 MG tablet Take 100 mg by mouth at bedtime.     urea (CARMOL) 40 % CREA Apply 1 application topically daily. To both feet     guaiFENesin (MUCINEX) 600 MG 12 hr tablet Take 600 mg by mouth every 6 (six) hours as needed for cough. (Patient not taking: Reported on 12/12/2020)     mirtazapine (REMERON) 15 MG tablet Take 15 mg by mouth at bedtime. (Patient not taking: Reported on 12/12/2020)     rivaroxaban (XARELTO) 20 MG TABS tablet Take 1 tablet (20 mg total) by mouth at bedtime. (Patient not taking: Reported on 12/12/2020) 30 tablet     Prior to Admission medications   Medication Sig Start Date End Date Taking? Authorizing Provider  acetaminophen (TYLENOL) 325 MG tablet Take 650 mg by mouth every 6 (six) hours.   Yes [provider]  apixaban (ELIQUIS) 5 MG TABS tablet Take 5 mg by mouth daily.   Yes [provider]  bethanechol (URECHOLINE) 10 MG tablet Take 10 mg by mouth 3 (three)  times daily. 09/08/17  Yes [provider]  docusate sodium (COLACE) 100 MG capsule Take 100 mg by mouth daily.   Yes [provider]  gabapentin (NEURONTIN) 100 MG capsule Take 200 mg by mouth 2 (two) times daily. 09/08/17  Yes Ellwood Sayers, MD  levETIRAcetam (KEPPRA) 100 MG/ML solution Take 7.5 mLs (750 mg total) by mouth 2 (two) times daily. 04/14/20  Yes Meredeth Ide, MD  losartan (COZAAR) 25 MG tablet Take 1 tablet (25 mg total) by mouth daily. 04/14/20  Yes Lama, Sarina Ill, MD  melatonin 3 MG TABS tablet Take 6 mg by mouth at bedtime. 08/08/20  Yes [provider]  metoprolol tartrate (LOPRESSOR) 25 mg/10  mL SUSP Take 20 mLs (50 mg total) by mouth 2 (two) times daily. 04/14/20  Yes Meredeth Ide, MD  NON FORMULARY Apply 1 application topically 2 (two) times daily. Hand palm protector   Yes [provider]  Nutritional Supplements (FEEDING SUPPLEMENT, JEVITY 1.2 CAL,) LIQD Place 1,000 mLs into feeding tube continuous. 70ml per hour per G tube continuously- start infusion daily at 0700 and stop at 2300   Yes [provider]  Nutritional Supplements (NUTRITIONAL SUPPLEMENT PO) Take 237 mLs by mouth 2 (two) times daily. Ensure   Yes [provider]  polyethylene glycol (MIRALAX / GLYCOLAX) packet Take 17 g by mouth daily.   Yes [provider]  Probiotic Product (PROBIOTIC DAILY PO) Take 1 capsule by mouth 2 (two) times daily. 09/08/17  Yes [provider]  rosuvastatin (CRESTOR) 5 MG tablet Take 5 mg by mouth daily. 09/24/17  Yes [provider]  sertraline (ZOLOFT) 50 MG tablet Take 50 mg by mouth daily.   Yes [provider]  traZODone (DESYREL) 100 MG tablet Take 100 mg by mouth at bedtime.   Yes [provider]  urea (CARMOL) 40 % CREA Apply 1 application topically daily. To both feet   Yes [provider]  guaiFENesin (MUCINEX) 600 MG 12 hr tablet Take 600 mg by mouth every 6 (six) hours as  needed for cough. Patient not taking: Reported on 12/12/2020    [provider]  mirtazapine (REMERON) 15 MG tablet Take 15 mg by mouth at bedtime. Patient not taking: Reported on 12/12/2020    [provider]  rivaroxaban (XARELTO) 20 MG TABS tablet Take 1 tablet (20 mg total) by mouth at bedtime. Patient not taking: Reported on 12/12/2020 04/15/20   Meredeth Ide, MD    Blood pressure 115/79, pulse 84, temperature 98.9 F (37.2 C), temperature source Oral, resp. rate 18, height 5' (1.524 m), weight 59.3 kg, SpO2 95 %. Physical Exam: General: chronically ill appearing female who is laying in bed in NAD HEENT: head is normocephalic, atraumatic.  Sclera are noninjected.  Pupils equal and round.  Ears and nose without any masses or lesions.  Mouth is dry. Dentition fair Heart: regular, rate, and rhythm.  Normal s1,s2. No obvious murmurs, gallops, or rubs noted.  Palpable pedal pulses bilaterally  Lungs: CTAB, no wheezes, rhonchi, or rales noted.  Respiratory effort nonlabored Abd: soft, NT/ND, +BS, no masses, hernias, or organomegaly. PEG tube in place to gravity MS: no BUE/BLE edema, calves soft and nontender Skin: warm and dry with no masses, lesions, or rashes Psych: difficult to assess, patient with one word answers to questions  Neuro: cranial nerves grossly intact, moving all 4 extremities but weakness noted to left extremities. Follow commands  Results for orders placed or performed during the hospital encounter of 12/11/20 (from the past 48 hour(s))  Lactic acid, plasma     Status: Abnormal   Collection Time: 12/11/20 10:54 PM  Result Value Ref Range   Lactic Acid, Venous 3.8 (HH) 0.5 - 1.9 mmol/L    Comment: CRITICAL RESULT CALLED TO, READ BACK BY AND VERIFIED WITH: KISER C @0009  BY BATTLET Performed at [facility], [address], [place], [place]   Comprehensive metabolic panel     Status: Abnormal   Collection Time: 12/11/20  10:54 PM  Result Value Ref Range   Sodium 146 (H) 135 - 145 mmol/L   Potassium 3.8 3.5 - 5.1 mmol/L   Chloride 105  98 - 111 mmol/L   CO2 28 22 - 32 mmol/L   Glucose, Bld 130 (H) 70 - 99 mg/dL    Comment: Glucose reference range applies only to samples taken after fasting for at least 8 hours.   BUN 51 (H) 6 - 20 mg/dL   Creatinine, Ser 3.24 0.44 - 1.00 mg/dL   Calcium 40.1 (H) 8.9 - 10.3 mg/dL   Total Protein 9.6 (H) 6.5 - 8.1 g/dL   Albumin 4.6 3.5 - 5.0 g/dL   AST 19 15 - 41 U/L   ALT 13 0 - 44 U/L   Alkaline Phosphatase 110 38 - 126 U/L   Total Bilirubin 0.4 0.3 - 1.2 mg/dL   GFR, Estimated >02 >72 mL/min    Comment: (NOTE) Calculated using the CKD-EPI Creatinine Equation (2021)    Anion gap 13 5 - 15    Comment: Performed at [facility], [address] [place], [place] [address]  CBC with Differential     Status: Abnormal   Collection Time: 12/11/20 10:54 PM  Result Value Ref Range   WBC 13.7 (H) 4.0 - 10.5 K/uL   RBC 4.79 3.87 - 5.11 MIL/uL   Hemoglobin 14.1 12.0 - 15.0 g/dL   HCT 40.3 47.4 - 25.9 %   MCV 92.9 80.0 - 100.0 fL   MCH 29.4 26.0 - 34.0 pg   MCHC 31.7 30.0 - 36.0 g/dL   RDW 56.3 87.5 - 64.3 %   Platelets 291 150 - 400 K/uL   nRBC 0.0 0.0 - 0.2 %   Neutrophils Relative % 84 %   Neutro Abs 11.3 (H) 1.7 - 7.7 K/uL   Lymphocytes Relative 10 %   Lymphs Abs 1.4 0.7 - 4.0 K/uL   Monocytes Relative 6 %   Monocytes Absolute 0.8 0.1 - 1.0 K/uL   Eosinophils Relative 0 %   Eosinophils Absolute 0.0 0.0 - 0.5 K/uL   Basophils Relative 0 %   Basophils Absolute 0.0 0.0 - 0.1 K/uL   WBC Morphology MORPHOLOGY UNREMARKABLE    Immature Granulocytes 0 %   Abs Immature Granulocytes 0.05 0.00 - 0.07 K/uL    Comment: Performed at [facility], [address], [place], [place] [address]  Lipase, blood     Status: Abnormal   Collection Time: 12/11/20 10:54 PM  Result Value Ref Range   Lipase 62 (H) 11 - 51 U/L    Comment: Performed  at [facility], [address], [place], [place] [address]  Resp Panel by RT-PCR (Flu A&B, Covid) Nasopharyngeal Swab     Status: None   Collection Time: 12/11/20 10:54 PM   Specimen: Nasopharyngeal Swab; Nasopharyngeal(NP) swabs in vial transport medium  Result Value Ref Range   SARS Coronavirus 2 by RT PCR NEGATIVE NEGATIVE    Comment: (NOTE) SARS-CoV-2 target nucleic acids are NOT DETECTED.  The SARS-CoV-2 RNA is generally detectable in upper respiratory specimens during the acute phase of infection. The lowest concentration of SARS-CoV-2 viral copies this assay can detect is 138 copies/mL. A negative result does not preclude SARS-Cov-2 infection and should not be used as the sole basis for treatment or other patient management decisions. A negative result may occur with  improper specimen collection/handling, submission of specimen other than nasopharyngeal swab, presence of viral mutation(s) within the areas targeted by this assay, and inadequate number of viral copies(<138 copies/mL). A negative result must be combined with clinical observations, patient history, and epidemiological information. The expected  result is Negative.  Fact Sheet for Patients:  BloggerCourse.com  Fact Sheet for Healthcare Providers:  SeriousBroker.it  This test is no t yet approved or cleared by the [place] FDA and  has been authorized for detection and/or diagnosis of SARS-CoV-2 by FDA under an Emergency Use Authorization (EUA). This EUA will remain  in effect (meaning this test can be used) for the duration of the COVID-19 declaration under Section 564(b)(1) of the Act, 21 U.S.C.section 360bbb-3(b)(1), unless the authorization is terminated  or revoked sooner.       Influenza A by PCR NEGATIVE NEGATIVE   Influenza B by PCR NEGATIVE NEGATIVE    Comment: (NOTE) The Xpert Xpress SARS-CoV-2/FLU/RSV plus assay is intended  as an aid in the diagnosis of influenza from Nasopharyngeal swab specimens and should not be used as a sole basis for treatment. Nasal washings and aspirates are unacceptable for Xpert Xpress SARS-CoV-2/FLU/RSV testing.  Fact Sheet for Patients: BloggerCourse.com  Fact Sheet for Healthcare Providers: SeriousBroker.it  This test is not yet approved or cleared by the [place] FDA and has been authorized for detection and/or diagnosis of SARS-CoV-2 by FDA under an Emergency Use Authorization (EUA). This EUA will remain in effect (meaning this test can be used) for the duration of the COVID-19 declaration under Section 564(b)(1) of the Act, 21 U.S.C. section 360bbb-3(b)(1), unless the authorization is terminated or revoked.  Performed at [facility], [address] [place], [place] [address]   Urinalysis, Routine w reflex microscopic Urine, Clean Catch     Status: Abnormal   Collection Time: 12/12/20  1:34 AM  Result Value Ref Range   Color, Urine AMBER (A) YELLOW    Comment: BIOCHEMICALS MAY BE AFFECTED BY COLOR   APPearance CLEAR CLEAR   Specific Gravity, Urine 1.029 1.005 - 1.030   pH 5.0 5.0 - 8.0   Glucose, UA NEGATIVE NEGATIVE mg/dL   Hgb urine dipstick NEGATIVE NEGATIVE   Bilirubin Urine NEGATIVE NEGATIVE   Ketones, ur NEGATIVE NEGATIVE mg/dL   Protein, ur 706 (A) NEGATIVE mg/dL   Nitrite NEGATIVE NEGATIVE   Leukocytes,Ua NEGATIVE NEGATIVE   RBC / HPF 0-5 0 - 5 RBC/hpf   WBC, UA 0-5 0 - 5 WBC/hpf   Bacteria, UA NONE SEEN NONE SEEN   Squamous Epithelial / LPF 0-5 0 - 5   Mucus PRESENT     Comment: Performed at [facility], [address] [place], [place] [address]  Lactic acid, plasma     Status: Abnormal   Collection Time: 12/12/20  3:34 AM  Result Value Ref Range   Lactic Acid, Venous 3.6 (HH) 0.5 - 1.9 mmol/L    Comment: CRITICAL VALUE NOTED.  VALUE IS CONSISTENT WITH  PREVIOUSLY REPORTED AND CALLED VALUE. Performed at [facility], [address], [place], [place] [address]   Glucose, capillary     Status: None   Collection Time: 12/12/20  3:54 AM  Result Value Ref Range   Glucose-Capillary 88 70 - 99 mg/dL    Comment: Glucose reference range applies only to samples taken after fasting for at least 8 hours.  CBC     Status: Abnormal   Collection Time: 12/12/20  4:06 AM  Result Value Ref Range   WBC 13.6 (H) 4.0 - 10.5 K/uL   RBC 3.98 3.87 - 5.11 MIL/uL   Hemoglobin 12.0 12.0 - 15.0 g/dL   HCT 76.1 60.7 - 37.1 %   MCV 97.2 80.0 - 100.0  fL   MCH 30.2 26.0 - 34.0 pg   MCHC 31.0 30.0 - 36.0 g/dL   RDW 16.1 09.6 - 04.5 %   Platelets 226 150 - 400 K/uL   nRBC 0.0 0.0 - 0.2 %    Comment: Performed at [facility], [address] [place], [place] [address]  Basic metabolic panel     Status: Abnormal   Collection Time: 12/12/20  4:06 AM  Result Value Ref Range   Sodium 151 (H) 135 - 145 mmol/L   Potassium 4.1 3.5 - 5.1 mmol/L   Chloride 113 (H) 98 - 111 mmol/L   CO2 28 22 - 32 mmol/L   Glucose, Bld 98 70 - 99 mg/dL    Comment: Glucose reference range applies only to samples taken after fasting for at least 8 hours.   BUN 39 (H) 6 - 20 mg/dL   Creatinine, Ser 1.91 0.44 - 1.00 mg/dL   Calcium 8.7 (L) 8.9 - 10.3 mg/dL   GFR, Estimated >47 >82 mL/min    Comment: (NOTE) Calculated using the CKD-EPI Creatinine Equation (2021)    Anion gap 10 5 - 15    Comment: Performed at [facility], [address] [place], [place] [address]  Glucose, capillary     Status: Abnormal   Collection Time: 12/12/20  8:10 AM  Result Value Ref Range   Glucose-Capillary 100 (H) 70 - 99 mg/dL    Comment: Glucose reference range applies only to samples taken after fasting for at least 8 hours.   CT Abdomen Pelvis W Contrast  Result Date: 12/12/2020 CLINICAL DATA:  Abdominal pain, peg tube replacement today EXAM:  CT ABDOMEN AND PELVIS WITH CONTRAST TECHNIQUE: Multidetector CT imaging of the abdomen and pelvis was performed using the standard protocol following bolus administration of intravenous contrast. CONTRAST:  80mL OMNIPAQUE IOHEXOL 350 MG/ML SOLN COMPARISON:  Abdominal radiograph dated 12/11/2020. CT dated 04/13/2020. CT abdomen/pelvis dated 09/06/2017. FINDINGS: Lower chest: Mild ground-glass opacity/atelectasis in the lingula. Mild dependent atelectasis at the right lung base. Hepatobiliary: Liver is within normal limits. Gallbladder is unremarkable. No intrahepatic or extrahepatic dilatation. Pancreas: Within normal limits. Spleen: Within normal limits. Adrenals/Urinary Tract: Adrenal glands are within normal limits. Kidneys are within normal limits.  No hydronephrosis. Bladder is within normal limits. Stomach/Bowel: Gastrostomy in the stomach. Streak artifact in the left upper abdomen from Gastrografin administered via indwelling gastrostomy tube. Dilated loops of small bowel in the left upper abdomen, with possible transition in the left mid abdomen (series 3/image 62). Colon is not decompressed. However, this appearance raises concern for partial small bowel obstruction. Notably, there was a similar appearance (although with more marked dilatation) on prior CT abdomen study in March (the study did not include the pelvis). No pneumatosis. Mild rectal stool burden (series 3/image 81), suggesting mild fecal impaction. Vascular/Lymphatic: No evidence of abdominal aortic aneurysm. Atherosclerotic calcifications of the abdominal aorta and branch vessels. No suspicious abdominopelvic lymphadenopathy. Reproductive: Status post hysterectomy. No adnexal masses. Other: No abdominopelvic ascites. No free air. Musculoskeletal: Visualized osseous structures are within normal limits. IMPRESSION: Dilated loops of small bowel in the left upper abdomen, with possible transition in the left mid abdomen, raising concern for partial  small bowel obstruction. Adynamic small bowel ileus is also possible but considered less likely. No pneumatosis.  No abdominopelvic ascites.  No free air. Gastrostomy in the stomach, in satisfactory position. Electronically Signed   By: Charline Bills M.D.   On: 12/12/2020 01:26  DG ABDOMEN PEG TUBE LOCATION  Result Date: 12/11/2020 CLINICAL DATA:  Abdominal pain. EXAM: ABDOMEN - 1 VIEW COMPARISON:  CT 04/13/2020 FINDINGS: Portable AP supine view of the abdomen obtained. There is contrast opacifying gastrostomy tubing as well as opacifying the stomach. Type and amount of contrast is not specified. No evidence of extravasation or leak. Dilated small bowel in the central abdomen, similar to prior CT. A stool ball distends the rectum. No visualized radiopaque calculi. IMPRESSION: 1. Gastrostomy tube well positioned within the stomach without evidence of extravasation or leak. 2. Gaseous small bowel distention in the central abdomen, also seen on prior CT in March. Electronically Signed   By: Narda Rutherford M.D.   On: 12/11/2020 23:19      Assessment/Plan SBO vs ileus Constipation - Patient currently has no abdominal pain, she is completely nontender on abdominal exam. She had a documented bowel movement yesterday. No role for acute surgical intervention. She was given gastrograffin around midnight last night. Will obtain follow up film this morning. Keep PEG tube to gravity drainage, NPO.  Patient also noted to have a lot of stool in her colon/rectum. Recommend good bowel regimen, starting with enemas. Please hold eliquis, ok for heparin gtt if indicated.   ID - none VTE - per primary/ hold eliquis FEN - IVF, NPO, G tube to gravity Foley - in place  H/o seizure disorder PE/DVT on eliquis HTN HLD DM CHF (EF 45-50% on ECHO 05/2020) Nontraumatic SAH Left hemiparesis Nonambulatory/ wheelchair bound  Franne Forts, PA-C [facility] 12/12/2020, 9:12 AM Please see Amion for  pager number during day hours 7:00am-4:30pm

## 2020-12-12 NOTE — TOC Initial Note (Signed)
Transition of Care [facility]) - Initial/Assessment Note   Patient Details  Name: Rachel Vang MRN: [id number] Date of Birth: 1965-04-03  Transition of Care [facility]) CM/SW Contact:    Ewing Schlein, LCSW Phone Number: 12/12/2020, 1:32 PM  Clinical Narrative: Patient is a resident of [address] CSW spoke with Delorise Shiner at [facility] to discuss discharge needs. Per Delorise Shiner, patient will need a new FL2 as she was admitted. A new COVID test at discharge will not be required. Patient has been a long-term care resident at [facility] since 06/03/2017 and the plan is for her to return. TOC to follow.  Expected Discharge Plan: Long Term Nursing Home Barriers to Discharge: Continued Medical Work up  Expected Discharge Plan and Services Expected Discharge Plan: Long Term Nursing Home In-house Referral: Clinical Social Work Post Acute Care Choice: Nursing Home Living arrangements for the past 2 months: Skilled Nursing Facility              DME Arranged: N/A DME Agency: NA  Prior Living Arrangements/Services Living arrangements for the past 2 months: Skilled Nursing Facility Lives with:: Facility Resident Patient language and need for interpreter reviewed:: Yes Need for Family Participation in Patient Care: Yes (Comment) (Patient is nonverbal.) Care giver support system in place?: Yes (comment) Criminal Activity/Legal Involvement Pertinent to Current Situation/Hospitalization: No - Comment as needed  Activities of Daily Living Home Assistive Devices/Equipment: Wheelchair ADL Screening (condition at time of admission) Patient's cognitive ability adequate to safely complete daily activities?: No Is the patient deaf or have difficulty hearing?: No Does the patient have difficulty seeing, even when wearing glasses/contacts?: No Does the patient have difficulty concentrating, remembering, or making decisions?: Yes Patient able to express need for assistance with ADLs?: No (non-verbal) Does the  patient have difficulty dressing or bathing?: Yes Independently performs ADLs?: No Communication: Needs assistance (aphasic) Is this a change from baseline?: Pre-admission baseline Dressing (OT): Dependent Is this a change from baseline?: Pre-admission baseline Grooming: Dependent Is this a change from baseline?: Pre-admission baseline Feeding: Dependent Is this a change from baseline?: Pre-admission baseline Bathing: Dependent Is this a change from baseline?: Pre-admission baseline Toileting: Dependent Is this a change from baseline?: Pre-admission baseline In/Out Bed: Dependent Is this a change from baseline?: Pre-admission baseline Walks in Home: Dependent Is this a change from baseline?: Pre-admission baseline Does the patient have difficulty walking or climbing stairs?: Yes Weakness of Legs: Both Weakness of Arms/Hands: Both  Emotional Assessment Orientation: : Fluctuating Orientation (Suspected and/or reported Sundowners) (Difficult to assess as patient is nonverbal.) Alcohol / Substance Use: Not Applicable  Admission diagnosis:  Fecal impaction (HCC) [K56.41] Small bowel obstruction (HCC) [K56.609] Pain [R52] Partial small bowel obstruction (HCC) [K56.600] Patient Active Problem List   Diagnosis Date Noted   Partial small bowel obstruction (HCC) 12/12/2020   DCM (dilated cardiomyopathy) (HCC)    Pericardial effusion    Malnutrition of moderate degree 04/05/2020   Aspiration pneumonia (HCC) 04/05/2020   Sepsis due to pneumonia (HCC) 04/04/2020   Hypokalemia 04/04/2020   Prolonged QT interval 04/04/2020   Hemiplegia as late effect of cerebrovascular accident (CVA) (HCC) 09/19/2017   Fever 09/06/2017   Slow transit constipation 06/08/2017   Insomnia 06/08/2017   Generalized weakness 06/07/2017   Aspiration pneumonia of both lower lobes due to gastric secretions (HCC)    Demand ischemia (HCC)    Chronic pain of right knee    Chronic heel pain, right    Small bowel  obstruction (HCC)  Inappropriate sinus tachycardia    AKI (acute kidney injury) ([place]) 05/06/2017   Hypernatremia 05/06/2017   Dyslipidemia associated with type 2 diabetes mellitus ([place]) 04/27/2017   Gingivitis, acute, plaque induced 03/26/2017   Hypersecretion of saliva 03/14/2017   Bruxism (teeth grinding) 03/14/2017   Lobar pneumonia ([place])    Acute on chronic respiratory failure with hypoxia ([place])    Pressure injury of skin 02/16/2017   Foot drop, right foot 10/18/2016   CVA (cerebrovascular accident) ([place]) 08/30/2016   GERD without esophagitis 08/30/2016   Chronic pulmonary embolism ([place]) 07/03/2016   Weight loss, non-intentional 06/13/2016   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 [place] [address] Phone: [phone] Fax: [phone]  Readmission Risk Interventions No flowsheet data found.

## 2020-12-12 NOTE — Assessment & Plan Note (Addendum)
CT on admission significant for dilated loops of small bowel with possible transition point concerning for partial SBO. General surgery consulted. Gastrografin study performed on admission with evidence of contrast in the colon. Patient given multiple enemas with positive result and improvement of symptoms. -General surgery recommendations: bowel regimen, tube feeds to start today

## 2020-12-12 NOTE — ED Provider Notes (Addendum)
Nursing notes and vitals signs, including pulse oximetry, reviewed.  Summary of this visit's results, reviewed by myself:  EKG:  EKG Interpretation  Date/Time:    Ventricular Rate:    PR Interval:    QRS Duration:   QT Interval:    QTC Calculation:   R Axis:     Text Interpretation:          Labs:  Results for orders placed or performed during the hospital encounter of 12/11/20 (from the past 24 hour(s))  Lactic acid, plasma     Status: Abnormal   Collection Time: 12/11/20 10:54 PM  Result Value Ref Range   Lactic Acid, Venous 3.8 (HH) 0.5 - 1.9 mmol/L  Comprehensive metabolic panel     Status: Abnormal   Collection Time: 12/11/20 10:54 PM  Result Value Ref Range   Sodium 146 (H) 135 - 145 mmol/L   Potassium 3.8 3.5 - 5.1 mmol/L   Chloride 105 98 - 111 mmol/L   CO2 28 22 - 32 mmol/L   Glucose, Bld 130 (H) 70 - 99 mg/dL   BUN 51 (H) 6 - 20 mg/dL   Creatinine, Ser 5.36 0.44 - 1.00 mg/dL   Calcium 64.4 (H) 8.9 - 10.3 mg/dL   Total Protein 9.6 (H) 6.5 - 8.1 g/dL   Albumin 4.6 3.5 - 5.0 g/dL   AST 19 15 - 41 U/L   ALT 13 0 - 44 U/L   Alkaline Phosphatase 110 38 - 126 U/L   Total Bilirubin 0.4 0.3 - 1.2 mg/dL   GFR, Estimated >03 >47 mL/min   Anion gap 13 5 - 15  CBC with Differential     Status: Abnormal   Collection Time: 12/11/20 10:54 PM  Result Value Ref Range   WBC 13.7 (H) 4.0 - 10.5 K/uL   RBC 4.79 3.87 - 5.11 MIL/uL   Hemoglobin 14.1 12.0 - 15.0 g/dL   HCT 42.5 95.6 - 38.7 %   MCV 92.9 80.0 - 100.0 fL   MCH 29.4 26.0 - 34.0 pg   MCHC 31.7 30.0 - 36.0 g/dL   RDW 56.4 33.2 - 95.1 %   Platelets 291 150 - 400 K/uL   nRBC 0.0 0.0 - 0.2 %   Neutrophils Relative % 84 %   Neutro Abs 11.3 (H) 1.7 - 7.7 K/uL   Lymphocytes Relative 10 %   Lymphs Abs 1.4 0.7 - 4.0 K/uL   Monocytes Relative 6 %   Monocytes Absolute 0.8 0.1 - 1.0 K/uL   Eosinophils Relative 0 %   Eosinophils Absolute 0.0 0.0 - 0.5 K/uL   Basophils Relative 0 %   Basophils Absolute 0.0 0.0 - 0.1  K/uL   WBC Morphology MORPHOLOGY UNREMARKABLE    Immature Granulocytes 0 %   Abs Immature Granulocytes 0.05 0.00 - 0.07 K/uL  Lipase, blood     Status: Abnormal   Collection Time: 12/11/20 10:54 PM  Result Value Ref Range   Lipase 62 (H) 11 - 51 U/L  Resp Panel by RT-PCR (Flu A&B, Covid) Nasopharyngeal Swab     Status: None   Collection Time: 12/11/20 10:54 PM   Specimen: Nasopharyngeal Swab; Nasopharyngeal(NP) swabs in vial transport medium  Result Value Ref Range   SARS Coronavirus 2 by RT PCR NEGATIVE NEGATIVE   Influenza A by PCR NEGATIVE NEGATIVE   Influenza B by PCR NEGATIVE NEGATIVE  Urinalysis, Routine w reflex microscopic Urine, Clean Catch     Status: Abnormal   Collection Time: 12/12/20  1:34  AM  Result Value Ref Range   Color, Urine AMBER (A) YELLOW   APPearance CLEAR CLEAR   Specific Gravity, Urine 1.029 1.005 - 1.030   pH 5.0 5.0 - 8.0   Glucose, UA NEGATIVE NEGATIVE mg/dL   Hgb urine dipstick NEGATIVE NEGATIVE   Bilirubin Urine NEGATIVE NEGATIVE   Ketones, ur NEGATIVE NEGATIVE mg/dL   Protein, ur 720 (A) NEGATIVE mg/dL   Nitrite NEGATIVE NEGATIVE   Leukocytes,Ua NEGATIVE NEGATIVE   RBC / HPF 0-5 0 - 5 RBC/hpf   WBC, UA 0-5 0 - 5 WBC/hpf   Bacteria, UA NONE SEEN NONE SEEN   Squamous Epithelial / LPF 0-5 0 - 5   Mucus PRESENT   Lactic acid, plasma     Status: Abnormal   Collection Time: 12/12/20  3:34 AM  Result Value Ref Range   Lactic Acid, Venous 3.6 (HH) 0.5 - 1.9 mmol/L  Glucose, capillary     Status: None   Collection Time: 12/12/20  3:54 AM  Result Value Ref Range   Glucose-Capillary 88 70 - 99 mg/dL  CBC     Status: Abnormal   Collection Time: 12/12/20  4:06 AM  Result Value Ref Range   WBC 13.6 (H) 4.0 - 10.5 K/uL   RBC 3.98 3.87 - 5.11 MIL/uL   Hemoglobin 12.0 12.0 - 15.0 g/dL   HCT 94.7 09.6 - 28.3 %   MCV 97.2 80.0 - 100.0 fL   MCH 30.2 26.0 - 34.0 pg   MCHC 31.0 30.0 - 36.0 g/dL   RDW 66.2 94.7 - 65.4 %   Platelets 226 150 - 400 K/uL    nRBC 0.0 0.0 - 0.2 %  Basic metabolic panel     Status: Abnormal   Collection Time: 12/12/20  4:06 AM  Result Value Ref Range   Sodium 151 (H) 135 - 145 mmol/L   Potassium 4.1 3.5 - 5.1 mmol/L   Chloride 113 (H) 98 - 111 mmol/L   CO2 28 22 - 32 mmol/L   Glucose, Bld 98 70 - 99 mg/dL   BUN 39 (H) 6 - 20 mg/dL   Creatinine, Ser 6.50 0.44 - 1.00 mg/dL   Calcium 8.7 (L) 8.9 - 10.3 mg/dL   GFR, Estimated >35 >46 mL/min   Anion gap 10 5 - 15    Imaging Studies: CT Abdomen Pelvis W Contrast  Result Date: 12/12/2020 CLINICAL DATA:  Abdominal pain, peg tube replacement today EXAM: CT ABDOMEN AND PELVIS WITH CONTRAST TECHNIQUE: Multidetector CT imaging of the abdomen and pelvis was performed using the standard protocol following bolus administration of intravenous contrast. CONTRAST:  18mL OMNIPAQUE IOHEXOL 350 MG/ML SOLN COMPARISON:  Abdominal radiograph dated 12/11/2020. CT dated 04/13/2020. CT abdomen/pelvis dated 09/06/2017. FINDINGS: Lower chest: Mild ground-glass opacity/atelectasis in the lingula. Mild dependent atelectasis at the right lung base. Hepatobiliary: Liver is within normal limits. Gallbladder is unremarkable. No intrahepatic or extrahepatic dilatation. Pancreas: Within normal limits. Spleen: Within normal limits. Adrenals/Urinary Tract: Adrenal glands are within normal limits. Kidneys are within normal limits.  No hydronephrosis. Bladder is within normal limits. Stomach/Bowel: Gastrostomy in the stomach. Streak artifact in the left upper abdomen from Gastrografin administered via indwelling gastrostomy tube. Dilated loops of small bowel in the left upper abdomen, with possible transition in the left mid abdomen (series 3/image 62). Colon is not decompressed. However, this appearance raises concern for partial small bowel obstruction. Notably, there was a similar appearance (although with more marked dilatation) on prior CT abdomen study in March (  the study did not include the pelvis). No  pneumatosis. Mild rectal stool burden (series 3/image 81), suggesting mild fecal impaction. Vascular/Lymphatic: No evidence of abdominal aortic aneurysm. Atherosclerotic calcifications of the abdominal aorta and branch vessels. No suspicious abdominopelvic lymphadenopathy. Reproductive: Status post hysterectomy. No adnexal masses. Other: No abdominopelvic ascites. No free air. Musculoskeletal: Visualized osseous structures are within normal limits. IMPRESSION: Dilated loops of small bowel in the left upper abdomen, with possible transition in the left mid abdomen, raising concern for partial small bowel obstruction. Adynamic small bowel ileus is also possible but considered less likely. No pneumatosis.  No abdominopelvic ascites.  No free air. Gastrostomy in the stomach, in satisfactory position. Electronically Signed   By: Charline Bills M.D.   On: 12/12/2020 01:26   DG ABDOMEN PEG TUBE LOCATION  Result Date: 12/11/2020 CLINICAL DATA:  Abdominal pain. EXAM: ABDOMEN - 1 VIEW COMPARISON:  CT 04/13/2020 FINDINGS: Portable AP supine view of the abdomen obtained. There is contrast opacifying gastrostomy tubing as well as opacifying the stomach. Type and amount of contrast is not specified. No evidence of extravasation or leak. Dilated small bowel in the central abdomen, similar to prior CT. A stool ball distends the rectum. No visualized radiopaque calculi. IMPRESSION: 1. Gastrostomy tube well positioned within the stomach without evidence of extravasation or leak. 2. Gaseous small bowel distention in the central abdomen, also seen on prior CT in March. Electronically Signed   By: Narda Rutherford M.D.   On: 12/11/2020 23:19    1:30 AM Foley catheter placed for distended bladder seen on CT.  Bladder outlet possibly obstructed due to fecal impaction.  We will have patient admitted for suspected small bowel obstruction.  2:29 AM Dr. Loney Loh to admit to hospitalist service.  General surgery consulted and will  see later this morning.  CRITICAL CARE Performed by: Carlisle Beers Delsy Etzkorn Total critical care time: 30 minutes Critical care time was exclusive of separately billable procedures and treating other patients. Critical care was necessary to treat or prevent imminent or life-threatening deterioration. Critical care was time spent personally by me on the following activities: development of treatment plan with patient and/or surrogate as well as nursing, discussions with consultants, evaluation of patient's response to treatment, examination of patient, obtaining history from patient or surrogate, ordering and performing treatments and interventions, ordering and review of laboratory studies, ordering and review of radiographic studies, pulse oximetry and re-evaluation of patient's condition.    Paula Libra, MD 12/12/20 0229    Paula Libra, MD 12/12/20 [phone]

## 2020-12-12 NOTE — H&P (Signed)
History and Physical    [name] QDI:[id number] DOB: 1965-06-15 DOA: 12/11/2020  PCP: Patient, No Pcp Per (Inactive)  Chief Complaint: Abdominal pain  HPI: Rachel Vang is a 55 y.o. female with medical history significant of seizure disorder, PE/DVT on Eliquis, type 2 diabetes, hypertension, hyperlipidemia, nontraumatic subarachnoid hemorrhage, left hemiparesis, IBS, dysphagia status post PEG tube placement in March 2022, nonambulatory/wheelchair mobility, CHF presents to the ED for evaluation of abdominal pain and vomiting after she had PEG tube replaced yesterday.  Tachycardic on arrival to the ED.  No fever.  Labs showing WBC 13.7, hemoglobin 14.1, platelet count 291k.  Sodium 146, potassium 3.8, chloride 105, bicarb 28, BUN 51, creatinine 0.9, glucose 130.  Calcium 10.6, albumin 4.6.  Lipase 62.  LFTs normal.  Lactic acid 3.8.  UA not suggestive of infection.  COVID and influenza PCR negative.  Gastrografin study showing well-positioned G-tube.  CT showing dilated loops of small bowel in the left upper abdomen with possible transition in the left mid abdomen concerning for partial SBO.  G-tube in satisfactory position on CT as well. General surgery consulted and will see the patient in the morning.  Patient was given fentanyl, Zofran, and 2 L fluid boluses.  Tachycardia resolved.  Patient states she was throwing up all day yesterday.  Denies abdominal pain.  Denies history of prior abdominal surgeries.  She is not able to give any additional history.  Review of Systems:  All systems reviewed and apart from history of presenting illness, are negative.  Past Medical History:  Diagnosis Date   Acute pulmonary embolism (HCC) 02/19/2015   Acute respiratory failure (HCC)    Diabetes mellitus without complication (HCC)    Type 2, W/o complications   DVT (deep venous thrombosis) (HCC) 04/05/2015   Dysphagia    Epilepsy (HCC)    GERD (gastroesophageal reflux disease)    Hyperlipidemia     Hypertension    IBS (irritable bowel syndrome)    Nontraumatic subarachnoid hemorrhage (HCC)    SAH (subarachnoid hemorrhage) (HCC)    Urinary retention     Past Surgical History:  Procedure Laterality Date   ABDOMINAL SURGERY     ANEURYSM COILING     COLONOSCOPY N/A 02/20/2015   Procedure: COLONOSCOPY;  Surgeon: Iva Boop, MD;  Location: [facility] ENDOSCOPY;  Service: Endoscopy;  Laterality: N/A;   ESOPHAGOGASTRODUODENOSCOPY (EGD) WITH PROPOFOL N/A 02/02/2015   Procedure: ESOPHAGOGASTRODUODENOSCOPY (EGD) WITH PROPOFOL;  Surgeon: Jimmye Norman, MD;  Location: [facility] ENDOSCOPY;  Service: General;  Laterality: N/A;   IR GASTROSTOMY TUBE MOD SED  04/13/2020   IR GENERIC HISTORICAL  08/26/2015   IR GASTRIC TUBE PERC CHG W/O IMG GUIDE 08/26/2015 Irish Lack, MD [facility]   IR GENERIC HISTORICAL  09/14/2015   IR REPLC GASTRO/COLONIC TUBE PERCUT W/FLUORO 09/14/2015 Darrell K Allred, PA-C [facility]   IR REPLACE G-TUBE SIMPLE WO FLUORO  06/08/2016   IR REPLACE G-TUBE SIMPLE WO FLUORO  10/24/2016   IR REPLACE G-TUBE SIMPLE WO FLUORO  01/07/2017   PEG PLACEMENT N/A 02/02/2015   Procedure: PERCUTANEOUS ENDOSCOPIC GASTROSTOMY (PEG) PLACEMENT;  Surgeon: Jimmye Norman, MD;  Location: [facility] ENDOSCOPY;  Service: General;  Laterality: N/A;   RADIOLOGY WITH ANESTHESIA N/A 01/13/2015   Procedure: RADIOLOGY WITH ANESTHESIA;  Surgeon: Lisbeth Renshaw, MD;  Location: [facility] OR;  Service: Radiology;  Laterality: N/A;   TRACHEOSTOMY       reports that she has quit smoking. She has never used smokeless tobacco. She reports that she does  not drink alcohol and does not use drugs.  No Known Allergies  Family History  Problem Relation Age of Onset   Hypertension Other     Prior to Admission medications   Medication Sig Start Date End Date Taking? Authorizing Provider  acetaminophen (TYLENOL) 325 MG tablet Take 650 mg by mouth every 6 (six) hours.   Yes [provider]  apixaban (ELIQUIS) 5 MG TABS tablet  Take 5 mg by mouth daily.   Yes [provider]  bethanechol (URECHOLINE) 10 MG tablet Take 10 mg by mouth 3 (three) times daily. 09/08/17  Yes [provider]  docusate sodium (COLACE) 100 MG capsule Take 100 mg by mouth daily.   Yes [provider]  gabapentin (NEURONTIN) 100 MG capsule Take 200 mg by mouth 2 (two) times daily. 09/08/17  Yes Ellwood Sayers, MD  levETIRAcetam (KEPPRA) 100 MG/ML solution Take 7.5 mLs (750 mg total) by mouth 2 (two) times daily. 04/14/20  Yes Meredeth Ide, MD  losartan (COZAAR) 25 MG tablet Take 1 tablet (25 mg total) by mouth daily. 04/14/20  Yes Lama, Sarina Ill, MD  melatonin 3 MG TABS tablet Take 6 mg by mouth at bedtime. 08/08/20  Yes [provider]  metoprolol tartrate (LOPRESSOR) 25 mg/10 mL SUSP Take 20 mLs (50 mg total) by mouth 2 (two) times daily. 04/14/20  Yes Meredeth Ide, MD  NON FORMULARY Apply 1 application topically 2 (two) times daily. Hand palm protector   Yes [provider]  Nutritional Supplements (FEEDING SUPPLEMENT, JEVITY 1.2 CAL,) LIQD Place 1,000 mLs into feeding tube continuous. 67ml per hour per G tube continuously- start infusion daily at 0700 and stop at 2300   Yes [provider]  Nutritional Supplements (NUTRITIONAL SUPPLEMENT PO) Take 237 mLs by mouth 2 (two) times daily. Ensure   Yes [provider]  polyethylene glycol (MIRALAX / GLYCOLAX) packet Take 17 g by mouth daily.   Yes [provider]  Probiotic Product (PROBIOTIC DAILY PO) Take 1 capsule by mouth 2 (two) times daily. 09/08/17  Yes [provider]  rosuvastatin (CRESTOR) 5 MG tablet Take 5 mg by mouth daily. 09/24/17  Yes [provider]  sertraline (ZOLOFT) 50 MG tablet Take 50 mg by mouth daily.   Yes [provider]  traZODone (DESYREL) 100 MG tablet Take 100 mg by mouth at bedtime.   Yes [provider]  urea (CARMOL) 40 % CREA Apply 1 application topically daily. To  both feet   Yes [provider]  guaiFENesin (MUCINEX) 600 MG 12 hr tablet Take 600 mg by mouth every 6 (six) hours as needed for cough. Patient not taking: Reported on 12/12/2020    [provider]  mirtazapine (REMERON) 15 MG tablet Take 15 mg by mouth at bedtime. Patient not taking: Reported on 12/12/2020    [provider]  rivaroxaban (XARELTO) 20 MG TABS tablet Take 1 tablet (20 mg total) by mouth at bedtime. Patient not taking: Reported on 12/12/2020 04/15/20   Meredeth Ide, MD    Physical Exam: Vitals:   12/12/20 0215 12/12/20 0230 12/12/20 0245 12/12/20 0300  BP: 135/82 105/84 136/80 124/84  Pulse: 89 86 81 85  Resp: 17 15 15 18   Temp:      TempSrc:      SpO2: 94% 95% 99% 96%    Physical Exam Constitutional:      General: She is not in acute distress.    Comments: Resting comfortably watching  television  HENT:     Head: Normocephalic and atraumatic.  Eyes:     Extraocular Movements: Extraocular movements intact.     Conjunctiva/sclera: Conjunctivae normal.  Cardiovascular:     Rate and Rhythm: Normal rate and regular rhythm.     Pulses: Normal pulses.  Pulmonary:     Effort: Pulmonary effort is normal. No respiratory distress.     Breath sounds: Normal breath sounds. No wheezing or rales.  Abdominal:     General: Bowel sounds are normal.     Palpations: Abdomen is soft.     Tenderness: There is no abdominal tenderness. There is no guarding.     Comments: Abdomen mildly distended  Musculoskeletal:        General: No swelling or tenderness.     Cervical back: Normal range of motion and neck supple.  Skin:    General: Skin is warm and dry.  Neurological:     Mental Status: She is alert and oriented to person, place, and time.     Labs on Admission: I have personally reviewed following labs and imaging studies  CBC: Recent Labs  Lab 12/11/20 2254  WBC 13.7*  NEUTROABS 11.3*  HGB 14.1  HCT 44.5  MCV 92.9  PLT 291   Basic  Metabolic Panel: Recent Labs  Lab 12/11/20 2254  NA 146*  K 3.8  CL 105  CO2 28  GLUCOSE 130*  BUN 51*  CREATININE 0.92  CALCIUM 10.6*   GFR: CrCl cannot be calculated (Unknown ideal weight.). Liver Function Tests: Recent Labs  Lab 12/11/20 2254  AST 19  ALT 13  ALKPHOS 110  BILITOT 0.4  PROT 9.6*  ALBUMIN 4.6   Recent Labs  Lab 12/11/20 2254  LIPASE 62*   No results for input(s): AMMONIA in the last 168 hours. Coagulation Profile: No results for input(s): INR, PROTIME in the last 168 hours. Cardiac Enzymes: No results for input(s): CKTOTAL, CKMB, CKMBINDEX, TROPONINI in the last 168 hours. BNP (last 3 results) No results for input(s): PROBNP in the last 8760 hours. HbA1C: No results for input(s): HGBA1C in the last 72 hours. CBG: No results for input(s): GLUCAP in the last 168 hours. Lipid Profile: No results for input(s): CHOL, HDL, LDLCALC, TRIG, CHOLHDL, LDLDIRECT in the last 72 hours. Thyroid Function Tests: No results for input(s): TSH, T4TOTAL, FREET4, T3FREE, THYROIDAB in the last 72 hours. Anemia Panel: No results for input(s): VITAMINB12, FOLATE, FERRITIN, TIBC, IRON, RETICCTPCT in the last 72 hours. Urine analysis:    Component Value Date/Time   COLORURINE AMBER (A) 12/12/2020 0134   APPEARANCEUR CLEAR 12/12/2020 0134   LABSPEC 1.029 12/12/2020 0134   PHURINE 5.0 12/12/2020 0134   GLUCOSEU NEGATIVE 12/12/2020 0134   HGBUR NEGATIVE 12/12/2020 0134   BILIRUBINUR NEGATIVE 12/12/2020 0134   KETONESUR NEGATIVE 12/12/2020 0134   PROTEINUR 100 (A) 12/12/2020 0134   NITRITE NEGATIVE 12/12/2020 0134   LEUKOCYTESUR NEGATIVE 12/12/2020 0134    Radiological Exams on Admission: CT Abdomen Pelvis W Contrast  Result Date: 12/12/2020 CLINICAL DATA:  Abdominal pain, peg tube replacement today EXAM: CT ABDOMEN AND PELVIS WITH CONTRAST TECHNIQUE: Multidetector CT imaging of the abdomen and pelvis was performed using the standard protocol following bolus  administration of intravenous contrast. CONTRAST:  65mL OMNIPAQUE IOHEXOL 350 MG/ML SOLN COMPARISON:  Abdominal radiograph dated 12/11/2020. CT dated 04/13/2020. CT abdomen/pelvis dated 09/06/2017. FINDINGS: Lower chest: Mild ground-glass opacity/atelectasis in the lingula. Mild dependent atelectasis at the right lung base. Hepatobiliary: Liver is within normal  limits. Gallbladder is unremarkable. No intrahepatic or extrahepatic dilatation. Pancreas: Within normal limits. Spleen: Within normal limits. Adrenals/Urinary Tract: Adrenal glands are within normal limits. Kidneys are within normal limits.  No hydronephrosis. Bladder is within normal limits. Stomach/Bowel: Gastrostomy in the stomach. Streak artifact in the left upper abdomen from Gastrografin administered via indwelling gastrostomy tube. Dilated loops of small bowel in the left upper abdomen, with possible transition in the left mid abdomen (series 3/image 62). Colon is not decompressed. However, this appearance raises concern for partial small bowel obstruction. Notably, there was a similar appearance (although with more marked dilatation) on prior CT abdomen study in March (the study did not include the pelvis). No pneumatosis. Mild rectal stool burden (series 3/image 81), suggesting mild fecal impaction. Vascular/Lymphatic: No evidence of abdominal aortic aneurysm. Atherosclerotic calcifications of the abdominal aorta and branch vessels. No suspicious abdominopelvic lymphadenopathy. Reproductive: Status post hysterectomy. No adnexal masses. Other: No abdominopelvic ascites. No free air. Musculoskeletal: Visualized osseous structures are within normal limits. IMPRESSION: Dilated loops of small bowel in the left upper abdomen, with possible transition in the left mid abdomen, raising concern for partial small bowel obstruction. Adynamic small bowel ileus is also possible but considered less likely. No pneumatosis.  No abdominopelvic ascites.  No free air.  Gastrostomy in the stomach, in satisfactory position. Electronically Signed   By: Charline Bills M.D.   On: 12/12/2020 01:26   DG ABDOMEN PEG TUBE LOCATION  Result Date: 12/11/2020 CLINICAL DATA:  Abdominal pain. EXAM: ABDOMEN - 1 VIEW COMPARISON:  CT 04/13/2020 FINDINGS: Portable AP supine view of the abdomen obtained. There is contrast opacifying gastrostomy tubing as well as opacifying the stomach. Type and amount of contrast is not specified. No evidence of extravasation or leak. Dilated small bowel in the central abdomen, similar to prior CT. A stool ball distends the rectum. No visualized radiopaque calculi. IMPRESSION: 1. Gastrostomy tube well positioned within the stomach without evidence of extravasation or leak. 2. Gaseous small bowel distention in the central abdomen, also seen on prior CT in March. Electronically Signed   By: Narda Rutherford M.D.   On: 12/11/2020 23:19    Assessment/Plan Principal Problem:   Partial small bowel obstruction (HCC) Active Problems:   Dysphagia   Seizures (HCC)   DVT (deep venous thrombosis) (HCC)   Essential hypertension, benign   Partial SBO Patient here for evaluation of abdominal pain and vomiting after replacement of PEG tube yesterday.  CT showing dilated loops of small bowel in the left upper abdomen with possible transition in the left mid abdomen concerning for partial SBO.  G-tube well-positioned on Gastrografin study and CT.  Currently resting comfortably, not vomiting. -Bowel rest, hold tube feeds.  IV fluid hydration, pain management, antiemetic as needed.  Monitor electrolytes, and serial abdominal exams.  General surgery will consult in the morning.  Mild leukocytosis Likely reactive.  Tachycardia and lactic acidosis likely from dehydration.  Patient is not febrile.  UA without signs of infection.  No infectious etiology identified on CT abdomen. -Continue to monitor  Lactic acidosis Likely from dehydration (BUN elevated).   Tachycardia resolved after fluid boluses.  No fever or signs of sepsis at this time. 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  No signs of volume overload at this time. -Monitor volume status closely  Seizure disorder -Continue Keppra  History of PE/DVT -Hold Eliquis until patient is evaluated by general surgery.  Diet controlled type 2 diabetes -Check A1c.  Very sensitive sliding scale insulin every 4 hours.  Hypertension Stable.  Dysphagia status post PEG tube -Tube feeds on hold due to small bowel obstruction  DVT prophylaxis: Hold Eliquis until patient is evaluated by general surgery. Code Status: Full code Family Communication: No family available at this time. Disposition Plan: Status is: Inpatient  Remains inpatient appropriate because: Partial SBO requiring bowel rest, IV fluid hydration, IV analgesic  Level of care: Level of care: Med-Surg  The medical decision making on this patient was of high complexity and the patient is at high risk for clinical deterioration, therefore this is a level 3 visit.  John Giovanni MD [facility]  If 7PM-7AM, please contact night-coverage [email or web address]  12/12/2020, 3:37 AM

## 2020-12-12 NOTE — Progress Notes (Signed)
   Patient seen and examined at bedside, patient admitted after midnight, please see earlier detailed admission note by John Giovanni, MD. Briefly, patient presented secondary to abdominal pain and found to have evidence of partial small bowel obstruction.  Subjective: No nausea, vomiting, abdominal pain.  BP 118/78 (BP Location: Left Arm)   Pulse 85   Temp 97.6 F (36.4 C) (Oral)   Resp 18   Ht 5' (1.524 m)   Wt 59.3 kg   LMP  (LMP Unknown)   SpO2 95%   BMI 25.53 kg/m   General exam: Appears calm and comfortable Respiratory system: Clear to auscultation. Respiratory effort normal. Cardiovascular system: S1 & S2 heard, RRR. No murmurs, rubs, gallops or clicks. Gastrointestinal system: Abdomen is nondistended, soft and nontender. No organomegaly or masses felt. Normal bowel sounds heard. Central nervous system: Alert and oriented. Non-verbal Musculoskeletal: No edema. No calf tenderness Skin: No cyanosis. No rashes  Brief assessment/Plan:  * Partial small bowel obstruction (HCC) CT on admission significant for dilated loops of small bowel with possible transition point concerning for partial SBO. General surgery consulted. Gastrografin study performed on admission -General surgery recommendations: bowel regimen   Family communication: Son at bedside DVT prophylaxis: Heparin drip (Eliquis held secondary to above) Disposition: Discharge back to SNF in 1-2 days  Jacquelin Hawking, MD [facility] 12/12/2020, 1:48 PM

## 2020-12-13 DIAGNOSIS — E870 Hyperosmolality and hypernatremia: Secondary | ICD-10-CM

## 2020-12-13 DIAGNOSIS — E872 Acidosis, unspecified: Secondary | ICD-10-CM

## 2020-12-13 DIAGNOSIS — I10 Essential (primary) hypertension: Secondary | ICD-10-CM

## 2020-12-13 DIAGNOSIS — R569 Unspecified convulsions: Secondary | ICD-10-CM

## 2020-12-13 LAB — BASIC METABOLIC PANEL
Anion gap: 6 (ref 5–15)
BUN: 16 mg/dL (ref 6–20)
CO2: 26 mmol/L (ref 22–32)
Calcium: 8.9 mg/dL (ref 8.9–10.3)
Chloride: 114 mmol/L — ABNORMAL HIGH (ref 98–111)
Creatinine, Ser: 0.73 mg/dL (ref 0.44–1.00)
GFR, Estimated: >60 mL/min (ref >60)
Glucose, Bld: 84 mg/dL (ref 70–99)
Potassium: 2.8 mmol/L — ABNORMAL LOW (ref 3.5–5.1)
Sodium: 146 mmol/L — ABNORMAL HIGH (ref 135–145)

## 2020-12-13 LAB — GLUCOSE, CAPILLARY
Glucose-Capillary: 110 mg/dL — ABNORMAL HIGH (ref 70–99)
Glucose-Capillary: 121 mg/dL — ABNORMAL HIGH (ref 70–99)
Glucose-Capillary: 61 mg/dL — ABNORMAL LOW (ref 70–99)
Glucose-Capillary: 66 mg/dL — ABNORMAL LOW (ref 70–99)
Glucose-Capillary: 72 mg/dL (ref 70–99)
Glucose-Capillary: 77 mg/dL (ref 70–99)
Glucose-Capillary: 79 mg/dL (ref 70–99)
Glucose-Capillary: 89 mg/dL (ref 70–99)

## 2020-12-13 LAB — CBC
HCT: 32.3 % — ABNORMAL LOW (ref 36.0–46.0)
Hemoglobin: 10.2 g/dL — ABNORMAL LOW (ref 12.0–15.0)
MCH: 30.4 pg (ref 26.0–34.0)
MCHC: 31.6 g/dL (ref 30.0–36.0)
MCV: 96.1 fL (ref 80.0–100.0)
Platelets: 186 10*3/uL (ref 150–400)
RBC: 3.36 MIL/uL — ABNORMAL LOW (ref 3.87–5.11)
RDW: 14.5 % (ref 11.5–15.5)
WBC: 7.5 10*3/uL (ref 4.0–10.5)
nRBC: 0 % (ref 0.0–0.2)

## 2020-12-13 LAB — LACTIC ACID, PLASMA: Lactic Acid, Venous: 0.8 mmol/L (ref 0.5–1.9)

## 2020-12-13 LAB — HEPARIN LEVEL (UNFRACTIONATED)
Heparin Unfractionated: 0.41 IU/mL (ref 0.30–0.70)
Heparin Unfractionated: 0.6 IU/mL (ref 0.30–0.70)

## 2020-12-13 MED ORDER — FREE WATER
30.0000 mL | Status: DC
  Administered 2020-12-13 – 2020-12-14 (×5): 30 mL

## 2020-12-13 MED ORDER — DEXTROSE 50 % IV SOLN
12.5000 g | INTRAVENOUS | Status: AC
  Administered 2020-12-13: 12.5 g via INTRAVENOUS

## 2020-12-13 MED ORDER — DEXTROSE 5 % IV SOLN: INTRAVENOUS | Status: DC

## 2020-12-13 MED ORDER — VITAL HIGH PROTEIN PO LIQD: 1000.0000 mL | ORAL | Status: DC

## 2020-12-13 MED ORDER — HEPARIN (PORCINE) 25000 UT/250ML-% IV SOLN
900.0000 [IU]/h | INTRAVENOUS | Status: DC
  Administered 2020-12-13: 900 [IU]/h via INTRAVENOUS
  Filled 2020-12-13: qty 250

## 2020-12-13 MED ORDER — FLEET ENEMA 7-19 GM/118ML RE ENEM
1.0000 | ENEMA | Freq: Once | RECTAL | Status: AC
  Administered 2020-12-13: 1 via RECTAL
  Filled 2020-12-13: qty 1

## 2020-12-13 MED ORDER — OSMOLITE 1.2 CAL PO LIQD
1000.0000 mL | ORAL | Status: DC
  Administered 2020-12-13: 1000 mL
  Filled 2020-12-13: qty 1000

## 2020-12-13 MED ORDER — DEXTROSE 50 % IV SOLN
12.5000 g | INTRAVENOUS | Status: AC
  Administered 2020-12-13: 12.5 g via INTRAVENOUS
  Filled 2020-12-13: qty 50

## 2020-12-13 NOTE — Assessment & Plan Note (Addendum)
Patient is on Eliquis as an outpatient. Transitioned to Heparin IV during admission secondary to bowel obstruction. -Continue Heparin IV until able to transition back to Eliquis

## 2020-12-13 NOTE — Progress Notes (Signed)
Pharmacy: [facility]  Patient is a 55 y.o F with  hx DVT/PE on Eliquis PTA who presented to the ED on 11/13 with c/o abdominal pain.  Abdominal CT on 11/14 showed findings with concern for partial SBO.  She's currently NPO with anticoag. changed to heparin drip.  - heparin level is therapeutic at 0.60 - hgb down 10.2, plts 186K - per pt's RN, no bleeding noted  Goal of Therapy:  Heparin level 0.3-0.7 units/ml aPTT 66-102 seconds Monitor platelets by anticoagulation protocol: Yes  Plan:  - continue heparin drip at 900 units/hr - daily heparin level - monitor for s/sx bleeding  Dorna Leitz, PharmD, BCPS 12/13/2020 7:49 PM

## 2020-12-13 NOTE — Progress Notes (Signed)
PROGRESS NOTE    AJENE PIPHER  [id number] DOB: [birth year] DOA: 12/11/2020 PCP: Patient, No Pcp Per (Inactive)   Brief Narrative: ALLYSIN PUGLIESE is a 55 y.o. female with a hsitory of PE/DVT, seizures, diabetes mellitus type 2, hypertension, hyperlipidemia, SAH, left hemiparesis, IBS, dysphagia s/p PEG tube, non-ambulatory, CHF. Patient presented secondary to abdominal pain and found to have evidence of a small bowel obstruction. General surgery consulted on admission. Enemas given with improvement of symptoms.   Assessment & Plan:   * Partial small bowel obstruction (HCC) CT on admission significant for dilated loops of small bowel with possible transition point concerning for partial SBO. General surgery consulted. Gastrografin study performed on admission with evidence of contrast in the colon. Patient given multiple enemas with positive result and improvement of symptoms. -General surgery recommendations: bowel regimen, tube feeds to start today  Lactic acidosis Secondary to dehydration. Mildly improved. -Repeat lactic acid  Hypernatremia Recurrent issue. Currently in setting of decrease fluid intake -Start D5 water @ 75 ml/hr -Repeat BMP today and tomorrow  Essential hypertension, benign Patient is on metoprolol, losartan as an outpatient. Held secondary to SBO. Blood pressure stable.  DVT (deep venous thrombosis) ([place]) Patient is on Eliquis as an outpatient. Transitioned to Heparin IV during admission secondary to bowel obstruction. -Continue Heparin IV until able to transition back to Eliquis  Seizures ([place]) -Continue Keppra IV; transition back to enteral once tolerating     DVT prophylaxis: Heparin IV Code Status:   Code Status: Full Code Family Communication: None at bedside Disposition Plan: Discharge back to SNF pending ability to tolerate enteral intake   Consultants:  General surgery  Procedures:  None  Antimicrobials: None     Subjective: Some abdominal pain today.  Objective: Vitals:   12/12/20 1421 12/12/20 2031 12/13/20 0405 12/13/20 1346  BP: 120/79 115/74 133/85 130/70  Pulse: 82 79 80 (!) 54  Resp: 18 16 16 16   Temp: 98.7 F (37.1 C) 98.5 F (36.9 C) 99 F (37.2 C) 98.4 F (36.9 C)  TempSrc: Oral Oral Oral Oral  SpO2: 94% 95% 95% 98%  Weight:      Height:        Intake/Output Summary (Last 24 hours) at 12/13/2020 1459 Last data filed at 12/13/2020 1156 Gross per 24 hour  Intake 934.32 ml  Output 800 ml  Net 134.32 ml   Filed Weights   12/12/20 0356  Weight: 59.3 kg    Examination:  General exam: Appears calm and comfortable Respiratory system: Clear to auscultation. Respiratory effort normal. Cardiovascular system: S1 & S2 heard, RRR. No murmurs, rubs, gallops or clicks. Gastrointestinal system: Abdomen is nondistended, soft and mildly tender in RUQ, LUQ, LLQ. No organomegaly or masses felt. Normal bowel sounds heard. Central nervous system: Alert. Unable to test orientation. Answers questions with head nods/shakes Musculoskeletal: No edema. No calf tenderness Skin: No cyanosis. No rashes    Data Reviewed: I have personally reviewed following labs and imaging studies  CBC Lab Results  Component Value Date   WBC 13.6 (H) 12/12/2020   RBC 3.98 12/12/2020   HGB 12.0 12/12/2020   HCT 38.7 12/12/2020   MCV 97.2 12/12/2020   MCH 30.2 12/12/2020   PLT 226 12/12/2020   MCHC 31.0 12/12/2020   RDW 14.9 12/12/2020   LYMPHSABS 1.4 12/11/2020   MONOABS 0.8 12/11/2020   EOSABS 0.0 12/11/2020   BASOSABS 0.0 [id number]     Last metabolic panel Lab Results  Component Value Date  NA 151 (H) 12/12/2020   K 4.1 12/12/2020   CL 113 (H) 12/12/2020   CO2 28 12/12/2020   BUN 39 (H) 12/12/2020   CREATININE 0.83 12/12/2020   GLUCOSE 98 12/12/2020   GFRNONAA >60 12/12/2020   GFRAA >60 09/08/2017   CALCIUM 8.7 (L) 12/12/2020   PHOS 3.0 02/17/2017   PROT 9.6 (H) 12/11/2020    ALBUMIN 4.6 12/11/2020   BILITOT 0.4 12/11/2020   ALKPHOS 110 12/11/2020   AST 19 12/11/2020   ALT 13 12/11/2020   ANIONGAP 10 12/12/2020    CBG (last 3)  Recent Labs    12/13/20 0804 12/13/20 1143 12/13/20 1320  GLUCAP 72 66* 121*     GFR: Estimated Creatinine Clearance: 60.2 mL/min (by C-G formula based on SCr of 0.83 mg/dL).  Coagulation Profile: No results for input(s): INR, PROTIME in the last 168 hours.  Recent Results (from the past 240 hour(s))  Resp Panel by RT-PCR (Flu A&B, Covid) Nasopharyngeal Swab     Status: None   Collection Time: 12/11/20 10:54 PM   Specimen: Nasopharyngeal Swab; Nasopharyngeal(NP) swabs in vial transport medium  Result Value Ref Range Status   SARS Coronavirus 2 by RT PCR NEGATIVE NEGATIVE Final    Comment: (NOTE) SARS-CoV-2 target nucleic acids are NOT DETECTED.  The SARS-CoV-2 RNA is generally detectable in upper respiratory specimens during the acute phase of infection. The lowest concentration of SARS-CoV-2 viral copies this assay can detect is 138 copies/mL. A negative result does not preclude SARS-Cov-2 infection and should not be used as the sole basis for treatment or other patient management decisions. A negative result may occur with  improper specimen collection/handling, submission of specimen other than nasopharyngeal swab, presence of viral mutation(s) within the areas targeted by this assay, and inadequate number of viral copies(<138 copies/mL). A negative result must be combined with clinical observations, patient history, and epidemiological information. The expected result is Negative.  Fact Sheet for Patients:  BloggerCourse.com  Fact Sheet for Healthcare Providers:  SeriousBroker.it  This test is no t yet approved or cleared by the [place] FDA and  has been authorized for detection and/or diagnosis of SARS-CoV-2 by FDA under an Emergency Use Authorization  (EUA). This EUA will remain  in effect (meaning this test can be used) for the duration of the COVID-19 declaration under Section 564(b)(1) of the Act, 21 U.S.C.section 360bbb-3(b)(1), unless the authorization is terminated  or revoked sooner.       Influenza A by PCR NEGATIVE NEGATIVE Final   Influenza B by PCR NEGATIVE NEGATIVE Final    Comment: (NOTE) The Xpert Xpress SARS-CoV-2/FLU/RSV plus assay is intended as an aid in the diagnosis of influenza from Nasopharyngeal swab specimens and should not be used as a sole basis for treatment. Nasal washings and aspirates are unacceptable for Xpert Xpress SARS-CoV-2/FLU/RSV testing.  Fact Sheet for Patients: BloggerCourse.com  Fact Sheet for Healthcare Providers: SeriousBroker.it  This test is not yet approved or cleared by the [place] FDA and has been authorized for detection and/or diagnosis of SARS-CoV-2 by FDA under an Emergency Use Authorization (EUA). This EUA will remain in effect (meaning this test can be used) for the duration of the COVID-19 declaration under Section 564(b)(1) of the Act, 21 U.S.C. section 360bbb-3(b)(1), unless the authorization is terminated or revoked.  Performed at [facility], [address] [place], [place] [address]   MRSA Next Gen by PCR, Nasal     Status: Abnormal   Collection Time:  12/12/20 11:09 AM   Specimen: Nasal Mucosa; Nasal Swab  Result Value Ref Range Status   MRSA by PCR Next Gen DETECTED (A) NOT DETECTED Final    Comment: RESULT CALLED TO, READ BACK BY AND VERIFIED WITH: ABERNATHY,A. RN AT 1700 12/12/20 MULLINS,T (NOTE) The GeneXpert MRSA Assay (FDA approved for NASAL specimens only), is one component of a comprehensive MRSA colonization surveillance program. It is not intended to diagnose MRSA infection nor to guide or monitor treatment for MRSA infections. Test performance is not FDA approved in  patients less than 5 years old. Performed at [facility], [place] [address] [place], [place] [address]         Radiology Studies: CT Abdomen Pelvis W Contrast  Result Date: 12/12/2020 CLINICAL DATA:  Abdominal pain, peg tube replacement today EXAM: CT ABDOMEN AND PELVIS WITH CONTRAST TECHNIQUE: Multidetector CT imaging of the abdomen and pelvis was performed using the standard protocol following bolus administration of intravenous contrast. CONTRAST:  33mL OMNIPAQUE IOHEXOL 350 MG/ML SOLN COMPARISON:  Abdominal radiograph dated 12/11/2020. CT dated 04/13/2020. CT abdomen/pelvis dated 09/06/2017. FINDINGS: Lower chest: Mild ground-glass opacity/atelectasis in the lingula. Mild dependent atelectasis at the right lung base. Hepatobiliary: Liver is within normal limits. Gallbladder is unremarkable. No intrahepatic or extrahepatic dilatation. Pancreas: Within normal limits. Spleen: Within normal limits. Adrenals/Urinary Tract: Adrenal glands are within normal limits. Kidneys are within normal limits.  No hydronephrosis. Bladder is within normal limits. Stomach/Bowel: Gastrostomy in the stomach. Streak artifact in the left upper abdomen from Gastrografin administered via indwelling gastrostomy tube. Dilated loops of small bowel in the left upper abdomen, with possible transition in the left mid abdomen (series 3/image 62). Colon is not decompressed. However, this appearance raises concern for partial small bowel obstruction. Notably, there was a similar appearance (although with more marked dilatation) on prior CT abdomen study in March (the study did not include the pelvis). No pneumatosis. Mild rectal stool burden (series 3/image 81), suggesting mild fecal impaction. Vascular/Lymphatic: No evidence of abdominal aortic aneurysm. Atherosclerotic calcifications of the abdominal aorta and branch vessels. No suspicious abdominopelvic lymphadenopathy. Reproductive: Status post hysterectomy. No  adnexal masses. Other: No abdominopelvic ascites. No free air. Musculoskeletal: Visualized osseous structures are within normal limits. IMPRESSION: Dilated loops of small bowel in the left upper abdomen, with possible transition in the left mid abdomen, raising concern for partial small bowel obstruction. Adynamic small bowel ileus is also possible but considered less likely. No pneumatosis.  No abdominopelvic ascites.  No free air. Gastrostomy in the stomach, in satisfactory position. Electronically Signed   By: Julian Hy M.D.   On: 12/12/2020 01:26   DG ABDOMEN PEG TUBE LOCATION  Result Date: 12/11/2020 CLINICAL DATA:  Abdominal pain. EXAM: ABDOMEN - 1 VIEW COMPARISON:  CT 04/13/2020 FINDINGS: Portable AP supine view of the abdomen obtained. There is contrast opacifying gastrostomy tubing as well as opacifying the stomach. Type and amount of contrast is not specified. No evidence of extravasation or leak. Dilated small bowel in the central abdomen, similar to prior CT. A stool ball distends the rectum. No visualized radiopaque calculi. IMPRESSION: 1. Gastrostomy tube well positioned within the stomach without evidence of extravasation or leak. 2. Gaseous small bowel distention in the central abdomen, also seen on prior CT in March. Electronically Signed   By: Keith Rake M.D.   On: 12/11/2020 23:19   DG Abd Portable 1V  Result Date: 12/12/2020 CLINICAL DATA:  Follow-up small bowel obstruction. EXAM: PORTABLE ABDOMEN - 1  VIEW COMPARISON:  09/11/2017 and CT of the abdomen and pelvis on 12/12/2020 FINDINGS: Numerous dilated loops of bowel are identified throughout the abdomen and pelvis. Contrast is identified within nondilated distal colon, consistent with partial small bowel obstruction. Gastrostomy tube overlies the LEFT UPPER QUADRANT. There is no free intraperitoneal air. IMPRESSION: Persistent small bowel dilatation. Findings consistent with partial small bowel obstruction. Electronically  Signed   By: Nolon Nations M.D.   On: 12/12/2020 10:14        Scheduled Meds:  Chlorhexidine Gluconate Cloth  6 each Topical Daily   insulin aspart  0-6 Units Subcutaneous Q4H   mupirocin ointment  1 application Nasal BID   Continuous Infusions:  dextrose     heparin 900 Units/hr (12/13/20 1404)   levETIRAcetam 750 mg (12/13/20 0610)   promethazine (PHENERGAN) injection (IM or IVPB) 12.5 mg (12/13/20 0429)     LOS: 1 day     Cordelia Poche, MD [facility] 12/13/2020, 2:59 PM  If 7PM-7AM, please contact night-coverage [email or web address]

## 2020-12-13 NOTE — Progress Notes (Addendum)
ANTICOAGULATION CONSULT NOTE - Initial Consult  Pharmacy Consult for heparin Indication: history of DVT, Eliquis PTA on hold  No Known Allergies  Patient Measurements: Height: 5' (152.4 cm) Weight: 59.3 kg (130 lb 11.2 oz) IBW/kg (Calculated) : 45.5 Heparin Dosing Weight: 57.6 kg  Vital Signs: Temp: 99 F (37.2 C) (11/15 0405) Temp Source: Oral (11/15 0405) BP: 133/85 (11/15 0405) Pulse Rate: 80 (11/15 0405)  Labs: Recent Labs    12/11/20 2254 12/12/20 0406  HGB 14.1 12.0  HCT 44.5 38.7  PLT 291 226  CREATININE 0.92 0.83    Estimated Creatinine Clearance: 60.2 mL/min (by C-G formula based on SCr of 0.83 mg/dL).   Medical History: Past Medical History:  Diagnosis Date   Acute pulmonary embolism (HCC) 02/19/2015   Acute respiratory failure (HCC)    Diabetes mellitus without complication (HCC)    Type 2, W/o complications   DVT (deep venous thrombosis) (HCC) 04/05/2015   Dysphagia    Epilepsy (HCC)    GERD (gastroesophageal reflux disease)    Hyperlipidemia    Hypertension    IBS (irritable bowel syndrome)    Nontraumatic subarachnoid hemorrhage (HCC)    SAH (subarachnoid hemorrhage) (HCC)    Urinary retention     Medications:  Scheduled:   Chlorhexidine Gluconate Cloth  6 each Topical Daily   insulin aspart  0-6 Units Subcutaneous Q4H   mupirocin ointment  1 application Nasal BID    Assessment: 55 year old female presenting with abdominal pain, possible SBO vs ileus.  Patient is on Eliquis PTA for history of DVT with last dose being 11/13 AM.  CCS following and request to hold Eliquis for now, ok for heparin drip.  No acute indication for surgery - will continue to follow for any changes. Pharmacy consulted to dose heparin drip while Eliquis on hold.  Today, 12/13/20 -Baseline aPTT and heparin level ordered -Baseline heparin level 0.41 > will use heparin level for dosing  -Continue to hold Eliquis per CCS recs -CBC stable  Goal of Therapy:  Heparin  level 0.3-0.7 units/ml aPTT 66-102 seconds Monitor platelets by anticoagulation protocol: Yes   Plan:  No heparin bolus Start heparin drip at 900 units/hour F/u 6 hour heparin level Monitor CBC, s/sx bleeding, ability to take PO and transition back to Eliquis  Rexford Maus, PharmD 12/13/2020 1:03 PM

## 2020-12-13 NOTE — Assessment & Plan Note (Addendum)
Patient is on metoprolol, losartan as an outpatient. Held secondary to SBO. Blood pressure stable.

## 2020-12-13 NOTE — Assessment & Plan Note (Signed)
-  Continue Keppra IV; transition back to enteral once tolerating

## 2020-12-13 NOTE — Assessment & Plan Note (Signed)
Recurrent issue. Currently in setting of decrease fluid intake -Start D5 water @ 75 ml/hr -Repeat BMP today and tomorrow

## 2020-12-13 NOTE — Progress Notes (Signed)
Pt is alert and oriented. Enema was given with good effect. Pt had 3 large bowel movements. Pt had 2 episodes of hypoglycemia. Blood sugar was stabilized with dextrose. Unable to start IV D5 due to no IV access. MD was paged via secure chat and made aware. Family visited and answered all the questions. Pt started on Osmolite 41ml/hr, tolerating so far. No c/o pain. Monitoring closely.

## 2020-12-13 NOTE — Progress Notes (Signed)
Patient ID: Rachel Vang, female   DOB: 1965-03-12, 55 y.o.   MRN: [id number] [facility] Surgery Progress Note     Subjective: CC-  G tube is clamped. Denies any n/v. Passing flatus and had multiple Bms confirmed with nurse. Patient received 1 enema yesterday. She had one very large BM yesterday, and two smaller last night and this morning.   Objective: Vital signs in last 24 hours: Temp:  [97.6 F (36.4 C)-99 F (37.2 C)] 99 F (37.2 C) (11/15 0405) Pulse Rate:  [79-85] 80 (11/15 0405) Resp:  [16-18] 16 (11/15 0405) BP: (115-133)/(74-85) 133/85 (11/15 0405) SpO2:  [94 %-95 %] 95 % (11/15 0405) Last BM Date: 12/12/20  Intake/Output from previous day: 11/14 0701 - 11/15 0700 In: 826.3 [I.V.:668.3; IV Piggyback:158] Out: 650 [Urine:650] Intake/Output this shift: No intake/output data recorded.  PE: Gen:  Alert, NAD Pulm: rate and effort normal Abd: Soft, NT/ND, +BS, PEG tube in place and clamped Skin: no rashes noted, warm and dry  Lab Results:  Recent Labs    12/11/20 2254 12/12/20 0406  WBC 13.7* 13.6*  HGB 14.1 12.0  HCT 44.5 38.7  PLT 291 226   BMET Recent Labs    12/11/20 2254 12/12/20 0406  NA 146* 151*  K 3.8 4.1  CL 105 113*  CO2 28 28  GLUCOSE 130* 98  BUN 51* 39*  CREATININE 0.92 0.83  CALCIUM 10.6* 8.7*   PT/INR No results for input(s): LABPROT, INR in the last 72 hours. CMP     Component Value Date/Time   NA 151 (H) 12/12/2020 0406   NA 138 06/28/2020 1230   K 4.1 12/12/2020 0406   CL 113 (H) 12/12/2020 0406   CO2 28 12/12/2020 0406   GLUCOSE 98 12/12/2020 0406   BUN 39 (H) 12/12/2020 0406   BUN 21 06/28/2020 1230   CREATININE 0.83 12/12/2020 0406   CALCIUM 8.7 (L) 12/12/2020 0406   PROT 9.6 (H) 12/11/2020 2254   ALBUMIN 4.6 12/11/2020 2254   AST 19 12/11/2020 2254   ALT 13 12/11/2020 2254   ALKPHOS 110 12/11/2020 2254   BILITOT 0.4 12/11/2020 2254   GFRNONAA >60 12/12/2020 0406   GFRAA >60 09/08/2017 0520   Lipase      Component Value Date/Time   LIPASE 62 (H) 12/11/2020 2254       Studies/Results: CT Abdomen Pelvis W Contrast  Result Date: 12/12/2020 CLINICAL DATA:  Abdominal pain, peg tube replacement today EXAM: CT ABDOMEN AND PELVIS WITH CONTRAST TECHNIQUE: Multidetector CT imaging of the abdomen and pelvis was performed using the standard protocol following bolus administration of intravenous contrast. CONTRAST:  49mL OMNIPAQUE IOHEXOL 350 MG/ML SOLN COMPARISON:  Abdominal radiograph dated 12/11/2020. CT dated 04/13/2020. CT abdomen/pelvis dated 09/06/2017. FINDINGS: Lower chest: Mild ground-glass opacity/atelectasis in the lingula. Mild dependent atelectasis at the right lung base. Hepatobiliary: Liver is within normal limits. Gallbladder is unremarkable. No intrahepatic or extrahepatic dilatation. Pancreas: Within normal limits. Spleen: Within normal limits. Adrenals/Urinary Tract: Adrenal glands are within normal limits. Kidneys are within normal limits.  No hydronephrosis. Bladder is within normal limits. Stomach/Bowel: Gastrostomy in the stomach. Streak artifact in the left upper abdomen from Gastrografin administered via indwelling gastrostomy tube. Dilated loops of small bowel in the left upper abdomen, with possible transition in the left mid abdomen (series 3/image 62). Colon is not decompressed. However, this appearance raises concern for partial small bowel obstruction. Notably, there was a similar appearance (although with more marked dilatation) on prior CT  abdomen study in March (the study did not include the pelvis). No pneumatosis. Mild rectal stool burden (series 3/image 81), suggesting mild fecal impaction. Vascular/Lymphatic: No evidence of abdominal aortic aneurysm. Atherosclerotic calcifications of the abdominal aorta and branch vessels. No suspicious abdominopelvic lymphadenopathy. Reproductive: Status post hysterectomy. No adnexal masses. Other: No abdominopelvic ascites. No free air.  Musculoskeletal: Visualized osseous structures are within normal limits. IMPRESSION: Dilated loops of small bowel in the left upper abdomen, with possible transition in the left mid abdomen, raising concern for partial small bowel obstruction. Adynamic small bowel ileus is also possible but considered less likely. No pneumatosis.  No abdominopelvic ascites.  No free air. Gastrostomy in the stomach, in satisfactory position. Electronically Signed   By: Charline Bills M.D.   On: 12/12/2020 01:26   DG ABDOMEN PEG TUBE LOCATION  Result Date: 12/11/2020 CLINICAL DATA:  Abdominal pain. EXAM: ABDOMEN - 1 VIEW COMPARISON:  CT 04/13/2020 FINDINGS: Portable AP supine view of the abdomen obtained. There is contrast opacifying gastrostomy tubing as well as opacifying the stomach. Type and amount of contrast is not specified. No evidence of extravasation or leak. Dilated small bowel in the central abdomen, similar to prior CT. A stool ball distends the rectum. No visualized radiopaque calculi. IMPRESSION: 1. Gastrostomy tube well positioned within the stomach without evidence of extravasation or leak. 2. Gaseous small bowel distention in the central abdomen, also seen on prior CT in March. Electronically Signed   By: Narda Rutherford M.D.   On: 12/11/2020 23:19   DG Abd Portable 1V  Result Date: 12/12/2020 CLINICAL DATA:  Follow-up small bowel obstruction. EXAM: PORTABLE ABDOMEN - 1 VIEW COMPARISON:  09/11/2017 and CT of the abdomen and pelvis on 12/12/2020 FINDINGS: Numerous dilated loops of bowel are identified throughout the abdomen and pelvis. Contrast is identified within nondilated distal colon, consistent with partial small bowel obstruction. Gastrostomy tube overlies the LEFT UPPER QUADRANT. There is no free intraperitoneal air. IMPRESSION: Persistent small bowel dilatation. Findings consistent with partial small bowel obstruction. Electronically Signed   By: Norva Pavlov M.D.   On: 12/12/2020 10:14     Anti-infectives: Anti-infectives (From admission, onward)    None        Assessment/Plan SBO vs ileus Constipation - After 1 enema patient has had multiple Bms and her abdominal exam is benign. G tube is clamped and she has tolerated this without recurrent n/v. Will order another enema this morning given the amount of stool seen on CT scan. Ok to start trickle tube feedings. Ultimately patient will need to continue a good bowel regimen.  ID - none VTE - per primary/ hold eliquis, ok for heparin gtt if indicated FEN - IVF, NPO, ok for trickle tube feedings Foley - in place   H/o seizure disorder PE/DVT on eliquis HTN HLD DM CHF (EF 45-50% on ECHO 05/2020) Nontraumatic SAH Left hemiparesis Nonambulatory/ wheelchair bound   LOS: 1 day    Franne Forts, [facility] 12/13/2020, 9:29 AM Please see Amion for pager number during day hours 7:00am-4:30pm

## 2020-12-13 NOTE — Hospital Course (Signed)
Rachel Vang is a 55 y.o. female with a hsitory of PE/DVT, seizures, diabetes mellitus type 2, hypertension, hyperlipidemia, SAH, left hemiparesis, IBS, dysphagia s/p PEG tube, non-ambulatory, CHF. Patient presented secondary to abdominal pain and found to have evidence of a small bowel obstruction. General surgery consulted on admission. Enemas given with improvement of symptoms.

## 2020-12-13 NOTE — Progress Notes (Signed)
NUTRITION BRIEF NOTE  Consult received for tube feeding. Will order trickle rate TF of Osmolite 1.2 @ 20 ml/hr with 30 ml free water every 4 hours.   Full assessment to follow next date. Patient has a PEG from PTA. She was admitted due to SBO.      Trenton Gammon, MS, RD, LDN, CNSC Inpatient Clinical Dietitian RD pager # available in AMION  After hours/weekend pager # available in [facility]

## 2020-12-13 NOTE — Assessment & Plan Note (Signed)
Secondary to dehydration. Mildly improved. -Repeat lactic acid

## 2020-12-13 NOTE — Plan of Care (Signed)
  Problem: Coping: Goal: Level of anxiety will decrease Outcome: Progressing   Problem: Pain Managment: Goal: General experience of comfort will improve Outcome: Progressing   Problem: Safety: Goal: Ability to remain free from injury will improve Outcome: Progressing   

## 2020-12-14 LAB — GLUCOSE, CAPILLARY
Glucose-Capillary: 106 mg/dL — ABNORMAL HIGH (ref 70–99)
Glucose-Capillary: 125 mg/dL — ABNORMAL HIGH (ref 70–99)
Glucose-Capillary: 126 mg/dL — ABNORMAL HIGH (ref 70–99)
Glucose-Capillary: 132 mg/dL — ABNORMAL HIGH (ref 70–99)

## 2020-12-14 LAB — BASIC METABOLIC PANEL
Anion gap: 8 (ref 5–15)
BUN: 13 mg/dL (ref 6–20)
CO2: 26 mmol/L (ref 22–32)
Calcium: 9.3 mg/dL (ref 8.9–10.3)
Chloride: 108 mmol/L (ref 98–111)
Creatinine, Ser: 0.5 mg/dL (ref 0.44–1.00)
GFR, Estimated: >60 mL/min (ref >60)
Glucose, Bld: 135 mg/dL — ABNORMAL HIGH (ref 70–99)
Potassium: 2.8 mmol/L — ABNORMAL LOW (ref 3.5–5.1)
Sodium: 142 mmol/L (ref 135–145)

## 2020-12-14 LAB — CBC
HCT: 33.3 % — ABNORMAL LOW (ref 36.0–46.0)
Hemoglobin: 10.5 g/dL — ABNORMAL LOW (ref 12.0–15.0)
MCH: 29.4 pg (ref 26.0–34.0)
MCHC: 31.5 g/dL (ref 30.0–36.0)
MCV: 93.3 fL (ref 80.0–100.0)
Platelets: 180 10*3/uL (ref 150–400)
RBC: 3.57 MIL/uL — ABNORMAL LOW (ref 3.87–5.11)
RDW: 14.3 % (ref 11.5–15.5)
WBC: 8.4 10*3/uL (ref 4.0–10.5)
nRBC: 0 % (ref 0.0–0.2)

## 2020-12-14 LAB — HEPARIN LEVEL (UNFRACTIONATED): Heparin Unfractionated: 0.51 IU/mL (ref 0.30–0.70)

## 2020-12-14 MED ORDER — TRAZODONE HCL 100 MG PO TABS
100.0000 mg | ORAL_TABLET | Freq: Every day | ORAL | Status: DC
  Administered 2020-12-14: 100 mg via ORAL
  Filled 2020-12-14: qty 1

## 2020-12-14 MED ORDER — BISACODYL 10 MG RE SUPP: 10.0000 mg | Freq: Every day | RECTAL | Status: DC | PRN

## 2020-12-14 MED ORDER — PROSOURCE TF PO LIQD
45.0000 mL | Freq: Every day | ORAL | Status: DC
  Administered 2020-12-14 – 2020-12-15 (×2): 45 mL
  Filled 2020-12-14 (×2): qty 45

## 2020-12-14 MED ORDER — DOCUSATE SODIUM 50 MG/5ML PO LIQD
100.0000 mg | Freq: Two times a day (BID) | ORAL | Status: DC
  Filled 2020-12-14 (×4): qty 10

## 2020-12-14 MED ORDER — POLYETHYLENE GLYCOL 3350 17 G PO PACK
17.0000 g | PACK | Freq: Every day | ORAL | Status: DC
  Administered 2020-12-14: 17 g

## 2020-12-14 MED ORDER — LEVETIRACETAM 100 MG/ML PO SOLN
750.0000 mg | Freq: Two times a day (BID) | ORAL | Status: DC
  Administered 2020-12-14 – 2020-12-15 (×3): 750 mg via ORAL
  Filled 2020-12-14 (×4): qty 7.5

## 2020-12-14 MED ORDER — APIXABAN 5 MG PO TABS
5.0000 mg | ORAL_TABLET | Freq: Every day | ORAL | Status: DC
  Administered 2020-12-14 – 2020-12-15 (×2): 5 mg via ORAL
  Filled 2020-12-14 (×2): qty 1

## 2020-12-14 MED ORDER — FREE WATER
30.0000 mL | Freq: Four times a day (QID) | Status: DC
  Administered 2020-12-14 – 2020-12-15 (×4): 30 mL

## 2020-12-14 MED ORDER — SERTRALINE HCL 50 MG PO TABS
50.0000 mg | ORAL_TABLET | Freq: Every day | ORAL | Status: DC
  Administered 2020-12-14 – 2020-12-15 (×2): 50 mg via ORAL
  Filled 2020-12-14 (×2): qty 1

## 2020-12-14 MED ORDER — ROSUVASTATIN CALCIUM 5 MG PO TABS
5.0000 mg | ORAL_TABLET | Freq: Every day | ORAL | Status: DC
  Administered 2020-12-14 – 2020-12-15 (×2): 5 mg via ORAL
  Filled 2020-12-14 (×2): qty 1

## 2020-12-14 MED ORDER — JEVITY 1.2 CAL PO LIQD
1000.0000 mL | ORAL | Status: DC
  Administered 2020-12-14: 1000 mL
  Filled 2020-12-14 (×4): qty 1000

## 2020-12-14 MED ORDER — GABAPENTIN 100 MG PO CAPS
200.0000 mg | ORAL_CAPSULE | Freq: Two times a day (BID) | ORAL | Status: DC
  Administered 2020-12-14 – 2020-12-15 (×3): 200 mg via ORAL
  Filled 2020-12-14 (×2): qty 2

## 2020-12-14 MED ORDER — POTASSIUM CHLORIDE 10 MEQ/100ML IV SOLN
10.0000 meq | INTRAVENOUS | Status: AC
  Administered 2020-12-14 (×4): 10 meq via INTRAVENOUS
  Filled 2020-12-14 (×2): qty 100

## 2020-12-14 NOTE — Plan of Care (Signed)
  Problem: Pain Managment: Goal: General experience of comfort will improve Outcome: Progressing   Problem: Safety: Goal: Ability to remain free from injury will improve Outcome: Progressing   

## 2020-12-14 NOTE — Progress Notes (Addendum)
Initial Nutrition Assessment  DOCUMENTATION CODES:   Not applicable  INTERVENTION:  - will adjust TF regimen: Jevity 1.2 @ 40 ml/hr to advance by 10 ml every 8 hours of run time to reach goal rate of 70 ml/hr with 45 ml Prosource TF once/day and 200 ml free water every 4 hours of TF run time.   - at goal rate, this regimen will provide 1384 kcal, 73 grams protein, and 1704 ml free water.   - run time for TF is 16 hours/day, from 0700-2300.   NUTRITION DIAGNOSIS:   Inadequate oral intake related to inability to eat, dysphagia as evidenced by NPO status.  GOAL:   Patient will meet greater than or equal to 90% of their needs  MONITOR:   TF tolerance, Labs, Weight trends, I & O's  REASON FOR ASSESSMENT:   Consult Enteral/tube feeding initiation and management  ASSESSMENT:   55 year-old female with medical history of seizure disorder, PE/DVT on Eliquis, type 2 DM, HTN, HLD, nontraumatic subarachnoid hemorrhage, L hemiparesis, IBS, dysphagia s/p PEG placement in 03/2020, non-ambulatory/wheelchair mobility, and CHF. She presented to the ED for evaluation of abdominal pain and vomiting after PEG replacement on 12/11/20. CT showed dilated loops of small bowel in the LUQ of abdomen with possible partial SBO. PEG noted to be positioned well on CT and from gastrografin study.  Patient laying in bed. She is able to respond to yes/no-type questions by shaking and nodding her head.  Patient denies any abdominal discomfort or nausea this AM. She is receiving trickle rate TF of Osmolite 1.2 @ 20 ml/hr with 30 ml free water every 4 hours.   She confirms home TF regimen via PEG is Jevity 1.2 @ 70 ml/hr from 0700-2300 and that she is NPO at home. RD able to confirm with Pharmacy that we have several bottles of Jevity 1.2 RTH.   She confirms that she uses a wheelchair to get around, that it is non-motorized, and that she is able to push it herself.   Weight today is 128 lb which is the highest  weight recorded in the chart. Suspect depletions to BLE are d/t patient being non-ambulatory.   Surgery team's note from this AM states ok to advance TF to goal and that they, Surgery, are signing off on patient.   She has had several BMs between yesterday evening and earlier this AM. Suspect this could be the cause for drop in serum K.    Labs reviewed; CBGs: 106 and 132 mg/dl, K: 2.8 mmol/l.  Medications reviewed; 100 mg colace BID, sliding scale novolog, 17 g miralax/day, 10 mEq IV KCl x4 runs 11/16.  IVF; D5 @ 75 ml/hr (306 kcal/24 hrs).     NUTRITION - FOCUSED PHYSICAL EXAM:  Flowsheet Row Most Recent Value  Orbital Region No depletion  Upper Arm Region Moderate depletion  Thoracic and Lumbar Region Unable to assess  Buccal Region No depletion  Temple Region Mild depletion  Clavicle Bone Region Moderate depletion  Clavicle and Acromion Bone Region Mild depletion  Scapular Bone Region No depletion  Dorsal Hand No depletion  Patellar Region Mild depletion  Anterior Thigh Region Mild depletion  Posterior Calf Region Mild depletion  Edema (RD Assessment) None  Hair Reviewed  Eyes Reviewed  Mouth Reviewed  Skin Reviewed  Nails Reviewed       Diet Order:   Diet Order             Diet NPO time specified  Diet effective  now                   EDUCATION NEEDS:   No education needs have been identified at this time  Skin:  Skin Assessment: Reviewed RN Assessment  Last BM:  11/6 (type 6 x1 and type 7 x1)  Height:   Ht Readings from Last 1 Encounters:  12/12/20 5' (1.524 m)    Weight:   Wt Readings from Last 1 Encounters:  12/14/20 58 kg    Estimated Nutritional Needs:  Kcal:  1300-1500 kcal Protein:  70-85 grams Fluid:  >/= 1.7 L/day     Trenton Gammon, MS, RD, LDN, CNSC Inpatient Clinical Dietitian RD pager # available in AMION  After hours/weekend pager # available in [facility]

## 2020-12-14 NOTE — Progress Notes (Addendum)
ANTICOAGULATION CONSULT NOTE - Initial Consult  Pharmacy Consult for heparin Indication: history of DVT, Eliquis PTA on hold  No Known Allergies  Patient Measurements: Height: 5' (152.4 cm) Weight: 58 kg (127 lb 13.9 oz) IBW/kg (Calculated) : 45.5 Heparin Dosing Weight: 57.6 kg  Vital Signs: Temp: 98.3 F (36.8 C) (11/16 0420) Temp Source: Oral (11/16 0420) BP: 128/76 (11/16 0420) Pulse Rate: 66 (11/16 0420)  Labs: Recent Labs    12/12/20 0406 12/13/20 1337 12/13/20 1844 12/14/20 0338  HGB 12.0  --  10.2* 10.5*  HCT 38.7  --  32.3* 33.3*  PLT 226  --  186 180  HEPARINUNFRC  --  0.41 0.60 0.51  CREATININE 0.83  --  0.73 0.50     Estimated Creatinine Clearance: 61.9 mL/min (by C-G formula based on SCr of 0.5 mg/dL).   Medical History: Past Medical History:  Diagnosis Date   Acute pulmonary embolism (HCC) 02/19/2015   Acute respiratory failure (HCC)    Diabetes mellitus without complication (HCC)    Type 2, W/o complications   DVT (deep venous thrombosis) (HCC) 04/05/2015   Dysphagia    Epilepsy (HCC)    GERD (gastroesophageal reflux disease)    Hyperlipidemia    Hypertension    IBS (irritable bowel syndrome)    Nontraumatic subarachnoid hemorrhage (HCC)    SAH (subarachnoid hemorrhage) (HCC)    Urinary retention     Medications:  Scheduled:   Chlorhexidine Gluconate Cloth  6 each Topical Daily   feeding supplement (OSMOLITE 1.2 CAL)  1,000 mL Per Tube Q24H   free water  30 mL Per Tube Q4H   insulin aspart  0-6 Units Subcutaneous Q4H   mupirocin ointment  1 application Nasal BID    Assessment: 55 year old female presenting with abdominal pain, possible SBO vs ileus.  Patient is on Eliquis 5mg  PO daily PTA for history of DVT with last dose being 11/13 AM.  Also has history of SAH. [facility] following and request to hold Eliquis for now, ok for heparin drip.  No acute indication for surgery - will continue to follow for any changes. Pharmacy consulted to dose  heparin drip while Eliquis on hold.  Today, 12/14/20 -Heparin level therapeutic at 0.51 -Continue to hold Eliquis per [facility] recs -CBC stable  Goal of Therapy:  Heparin level 0.3-0.7 units/ml aPTT 66-102 seconds Monitor platelets by anticoagulation protocol: Yes   Plan:  Continue heparin drip at 900 units/hour Monitor daily heparin level Monitor CBC, s/sx bleeding, ability to take PO and transition back to Eliquis  12/16/20, PharmD 12/14/2020 8:12 AM  PM UPDATE: -Consulted to switch from heparin back to PTA Eliquis -Resumed Eliquis 5mg  PO daily -D/w RN - can d/c heparin drip now that Eliquis has been administered  12/16/2020, PharmD 12/14/2020 2:19 PM

## 2020-12-14 NOTE — Progress Notes (Signed)
PROGRESS NOTE    Rachel Vang  JFH:[id number] DOB: December 18, 1965 DOA: 12/11/2020 PCP: Patient, No Pcp Per (Inactive)   Brief Narrative:  Rachel Vang is a 55 y.o. female with a hsitory of PE/DVT, seizures, diabetes mellitus type 2, hypertension, hyperlipidemia, SAH, left hemiparesis, IBS, dysphagia s/p PEG tube, non-ambulatory, CHF. Patient presented secondary to abdominal pain and found to have evidence of a small bowel obstruction. General surgery consulted on admission. Enemas given with improvement of symptoms.     Assessment & Plan:   Partial small bowel obstruction (HCC), resolving -Surgery following, appreciate insight and recommendations  -Bowel movement/flatus noted overnight, advancing PEG tube feedings back to baseline  -If tolerating likely discharge back to facility in the next 24 to 48 hours    Lactic acidosis, resolved -Secondary to dehydration.  Continue increased PEG tube feeds and free water flushes per dietary   Hypernatremia -Hypovolemic in the setting of small bowel obstruction, resolving with increased PEG tube feeds and free water flushes    Essential hypertension, benign -Currently normotensive, continue to hold metoprolol losartan given well-controlled blood pressure currently  -Can be resumed in the outpatient setting if blood pressure continues to elevate back to previous levels   DVT (deep venous thrombosis) (HCC) -Transition off heparin back to Eliquis given no surgical intervention per discussion with surgery    Seizures (HCC) -Transition back to enteral Keppra     DVT prophylaxis: Heparin drip transitioning to Eliquis today Code Status:  Full Code Family Communication: None present  Status is: Inpatient  Dispo: The patient is from: SNF              Anticipated d/c is to: SNF              Anticipated d/c date is: 24 to 48 hours pending clinical course              Patient currently not medically stable for discharge  Consultants:   Surgery  Procedures:  None  Antimicrobials:  None indicated  Subjective: No acute issues or events overnight admits to episodes of flatus as well as bowel movement tolerating PEG tube feeds moderately well today, otherwise denies nausea vomiting headache fevers chills chest pain shortness of breath  Objective: Vitals:   12/13/20 1346 12/13/20 2024 12/14/20 0420 12/14/20 0500  BP: 130/70 127/74 128/76   Pulse: (!) 54 62 66   Resp: 16 16 16    Temp: 98.4 F (36.9 C) 98.5 F (36.9 C) 98.3 F (36.8 C)   TempSrc: Oral Oral Oral   SpO2: 98% 100% 93%   Weight:    58 kg  Height:        Intake/Output Summary (Last 24 hours) at 12/14/2020 0735 Last data filed at 12/14/2020 0655 Gross per 24 hour  Intake 1651.03 ml  Output 800 ml  Net 851.03 ml   Filed Weights   12/12/20 0356 12/14/20 0500  Weight: 59.3 kg 58 kg    Examination:  General exam: Appears calm and comfortable  Respiratory system: Clear to auscultation. Respiratory effort normal. Cardiovascular system: S1 & S2 heard, RRR. No JVD, murmurs, rubs, gallops or clicks. No pedal edema. Gastrointestinal system: Abdomen is nondistended, soft and nontender. No organomegaly or masses felt. Normal bowel sounds heard. Central nervous system: Alert and oriented. No focal neurological deficits. Extremities: Symmetric 5 x 5 power. Skin: No rashes, lesions or ulcers   Data Reviewed: I have personally reviewed following labs and imaging studies  CBC: Recent Labs  Lab  12/11/20 2254 12/12/20 0406 12/13/20 1844 12/14/20 0338  WBC 13.7* 13.6* 7.5 8.4  NEUTROABS 11.3*  --   --   --   HGB 14.1 12.0 10.2* 10.5*  HCT 44.5 38.7 32.3* 33.3*  MCV 92.9 97.2 96.1 93.3  PLT 291 226 186 [id number]   Basic Metabolic Panel: Recent Labs  Lab 12/11/20 2254 12/12/20 0406 12/13/20 1844 12/14/20 0338  NA 146* 151* 146* 142  K 3.8 4.1 2.8* 2.8*  CL 105 113* 114* 108  CO2 28 28 26 26   GLUCOSE 130* 98 84 135*  BUN 51* 39* 16 13   CREATININE 0.92 0.83 0.73 0.50  CALCIUM 10.6* 8.7* 8.9 9.3   GFR: Estimated Creatinine Clearance: 61.9 mL/min (by C-G formula based on SCr of 0.5 mg/dL). Liver Function Tests: Recent Labs  Lab 12/11/20 2254  AST 19  ALT 13  ALKPHOS 110  BILITOT 0.4  PROT 9.6*  ALBUMIN 4.6   Recent Labs  Lab 12/11/20 2254  LIPASE 62*   No results for input(s): AMMONIA in the last 168 hours. Coagulation Profile: No results for input(s): INR, PROTIME in the last 168 hours. Cardiac Enzymes: No results for input(s): CKTOTAL, CKMB, CKMBINDEX, TROPONINI in the last 168 hours. BNP (last 3 results) No results for input(s): PROBNP in the last 8760 hours. HbA1C: Recent Labs    12/12/20 0406  HGBA1C 6.0*   CBG: Recent Labs  Lab 12/13/20 1629 12/13/20 1736 12/13/20 2021 12/14/20 0020 12/14/20 0423  GLUCAP 61* 110* 89 106* 132*   Lipid Profile: No results for input(s): CHOL, HDL, LDLCALC, TRIG, CHOLHDL, LDLDIRECT in the last 72 hours. Thyroid Function Tests: No results for input(s): TSH, T4TOTAL, FREET4, T3FREE, THYROIDAB in the last 72 hours. Anemia Panel: No results for input(s): VITAMINB12, FOLATE, FERRITIN, TIBC, IRON, RETICCTPCT in the last 72 hours. Sepsis Labs: Recent Labs  Lab 12/11/20 2254 12/12/20 0334 12/13/20 1844  LATICACIDVEN 3.8* 3.6* 0.8    Recent Results (from the past 240 hour(s))  Resp Panel by RT-PCR (Flu A&B, Covid) Nasopharyngeal Swab     Status: None   Collection Time: 12/11/20 10:54 PM   Specimen: Nasopharyngeal Swab; Nasopharyngeal(NP) swabs in vial transport medium  Result Value Ref Range Status   SARS Coronavirus 2 by RT PCR NEGATIVE NEGATIVE Final    Comment: (NOTE) SARS-CoV-2 target nucleic acids are NOT DETECTED.  The SARS-CoV-2 RNA is generally detectable in upper respiratory specimens during the acute phase of infection. The lowest concentration of SARS-CoV-2 viral copies this assay can detect is 138 copies/mL. A negative result does not  preclude SARS-Cov-2 infection and should not be used as the sole basis for treatment or other patient management decisions. A negative result may occur with  improper specimen collection/handling, submission of specimen other than nasopharyngeal swab, presence of viral mutation(s) within the areas targeted by this assay, and inadequate number of viral copies(<138 copies/mL). A negative result must be combined with clinical observations, patient history, and epidemiological information. The expected result is Negative.  Fact Sheet for Patients:  EntrepreneurPulse.com.au  Fact Sheet for Healthcare Providers:  IncredibleEmployment.be  This test is no t yet approved or cleared by the [place] FDA and  has been authorized for detection and/or diagnosis of SARS-CoV-2 by FDA under an Emergency Use Authorization (EUA). This EUA will remain  in effect (meaning this test can be used) for the duration of the COVID-19 declaration under Section 564(b)(1) of the Act, 21 U.S.C.section 360bbb-3(b)(1), unless the authorization is terminated  or revoked sooner.  Influenza A by PCR NEGATIVE NEGATIVE Final   Influenza B by PCR NEGATIVE NEGATIVE Final    Comment: (NOTE) The Xpert Xpress SARS-CoV-2/FLU/RSV plus assay is intended as an aid in the diagnosis of influenza from Nasopharyngeal swab specimens and should not be used as a sole basis for treatment. Nasal washings and aspirates are unacceptable for Xpert Xpress SARS-CoV-2/FLU/RSV testing.  Fact Sheet for Patients: EntrepreneurPulse.com.au  Fact Sheet for Healthcare Providers: IncredibleEmployment.be  This test is not yet approved or cleared by the [place] FDA and has been authorized for detection and/or diagnosis of SARS-CoV-2 by FDA under an Emergency Use Authorization (EUA). This EUA will remain in effect (meaning this test can be used) for the  duration of the COVID-19 declaration under Section 564(b)(1) of the Act, 21 U.S.C. section 360bbb-3(b)(1), unless the authorization is terminated or revoked.  Performed at [facility], [place] [address] [place], [place] [address]   MRSA Next Gen by PCR, Nasal     Status: Abnormal   Collection Time: 12/12/20 11:09 AM   Specimen: Nasal Mucosa; Nasal Swab  Result Value Ref Range Status   MRSA by PCR Next Gen DETECTED (A) NOT DETECTED Final    Comment: RESULT CALLED TO, READ BACK BY AND VERIFIED WITH: ABERNATHY,A. RN AT 1700 12/12/20 MULLINS,T (NOTE) The GeneXpert MRSA Assay (FDA approved for NASAL specimens only), is one component of a comprehensive MRSA colonization surveillance program. It is not intended to diagnose MRSA infection nor to guide or monitor treatment for MRSA infections. Test performance is not FDA approved in patients less than 27 years old. Performed at [facility], [place] [address] [place], [place] [address]          Radiology Studies: DG Abd Portable 1V  Result Date: 12/12/2020 CLINICAL DATA:  Follow-up small bowel obstruction. EXAM: PORTABLE ABDOMEN - 1 VIEW COMPARISON:  09/11/2017 and CT of the abdomen and pelvis on 12/12/2020 FINDINGS: Numerous dilated loops of bowel are identified throughout the abdomen and pelvis. Contrast is identified within nondilated distal colon, consistent with partial small bowel obstruction. Gastrostomy tube overlies the LEFT UPPER QUADRANT. There is no free intraperitoneal air. IMPRESSION: Persistent small bowel dilatation. Findings consistent with partial small bowel obstruction. Electronically Signed   By: Nolon Nations M.D.   On: 12/12/2020 10:14     Scheduled Meds:  Chlorhexidine Gluconate Cloth  6 each Topical Daily   feeding supplement (OSMOLITE 1.2 CAL)  1,000 mL Per Tube Q24H   free water  30 mL Per Tube Q4H   insulin aspart  0-6 Units Subcutaneous Q4H   mupirocin ointment  1  application Nasal BID   Continuous Infusions:  dextrose 75 mL/hr at 12/14/20 0709   heparin 900 Units/hr (12/13/20 1800)   levETIRAcetam 750 mg (12/14/20 0620)   promethazine (PHENERGAN) injection (IM or IVPB) 12.5 mg (12/13/20 0429)     LOS: 2 days   Time spent: 75min  Takuma Cifelli C Julaine Zimny, DO [facility]  If 7PM-7AM, please contact night-coverage [email or web address]  12/14/2020, 7:35 AM

## 2020-12-14 NOTE — Progress Notes (Signed)
Patient ID: Rachel Vang, female   DOB: 1966/01/03, 55 y.o.   MRN: [id number] [facility] Surgery Progress Note     Subjective: CC-  No complaints this morning. Denies abdominal pain, nausea, vomiting. She had several bowel movements yesterday. Tube feedings running at 20cc/hr and patient tolerating well.  Objective: Vital signs in last 24 hours: Temp:  [98.3 F (36.8 C)-98.5 F (36.9 C)] 98.3 F (36.8 C) (11/16 0420) Pulse Rate:  [54-66] 66 (11/16 0420) Resp:  [16] 16 (11/16 0420) BP: (127-130)/(70-76) 128/76 (11/16 0420) SpO2:  [93 %-100 %] 93 % (11/16 0420) Weight:  [58 kg] 58 kg (11/16 0500) Last BM Date: 12/13/20  Intake/Output from previous day: 11/15 0701 - 11/16 0700 In: 1651 [I.V.:955.2; NG/GT:371.8; IV Piggyback:324] Out: 800 [Urine:800] Intake/Output this shift: No intake/output data recorded.  PE: Gen:  Alert, NAD Pulm: rate and effort normal Abd: Soft, NT/ND, +BS, PEG tube in place with TF running at 20cc/hr Skin: no rashes noted, warm and dry  Lab Results:  Recent Labs    12/13/20 1844 12/14/20 0338  WBC 7.5 8.4  HGB 10.2* 10.5*  HCT 32.3* 33.3*  PLT 186 180   BMET Recent Labs    12/13/20 1844 12/14/20 0338  NA 146* 142  K 2.8* 2.8*  CL 114* 108  CO2 26 26  GLUCOSE 84 135*  BUN 16 13  CREATININE 0.73 0.50  CALCIUM 8.9 9.3   PT/INR No results for input(s): LABPROT, INR in the last 72 hours. CMP     Component Value Date/Time   NA 142 12/14/2020 0338   NA 138 06/28/2020 1230   K 2.8 (L) 12/14/2020 0338   CL 108 12/14/2020 0338   CO2 26 12/14/2020 0338   GLUCOSE 135 (H) 12/14/2020 0338   BUN 13 12/14/2020 0338   BUN 21 06/28/2020 1230   CREATININE 0.50 12/14/2020 0338   CALCIUM 9.3 12/14/2020 0338   PROT 9.6 (H) 12/11/2020 2254   ALBUMIN 4.6 12/11/2020 2254   AST 19 12/11/2020 2254   ALT 13 12/11/2020 2254   ALKPHOS 110 12/11/2020 2254   BILITOT 0.4 12/11/2020 2254   GFRNONAA >60 12/14/2020 0338   GFRAA >60 09/08/2017  0520   Lipase     Component Value Date/Time   LIPASE 62 (H) 12/11/2020 2254       Studies/Results: DG Abd Portable 1V  Result Date: 12/12/2020 CLINICAL DATA:  Follow-up small bowel obstruction. EXAM: PORTABLE ABDOMEN - 1 VIEW COMPARISON:  09/11/2017 and CT of the abdomen and pelvis on 12/12/2020 FINDINGS: Numerous dilated loops of bowel are identified throughout the abdomen and pelvis. Contrast is identified within nondilated distal colon, consistent with partial small bowel obstruction. Gastrostomy tube overlies the LEFT UPPER QUADRANT. There is no free intraperitoneal air. IMPRESSION: Persistent small bowel dilatation. Findings consistent with partial small bowel obstruction. Electronically Signed   By: Norva Pavlov M.D.   On: 12/12/2020 10:14    Anti-infectives: Anti-infectives (From admission, onward)    None        Assessment/Plan SBO vs ileus Constipation - Seems to be resolving. Ok to advance tube feedings to goal. Will add daily miralax and colace. Recommend continuing bowel regimen at discharge. Ok to restart anticoagulation. We will sign off, please call with questions or concerns.   ID - none VTE - heparin gtt FEN - IVF, TF Foley - in place   H/o seizure disorder PE/DVT on eliquis HTN HLD DM CHF (EF 45-50% on ECHO 05/2020) Nontraumatic SAH Left hemiparesis  Nonambulatory/ wheelchair bound   LOS: 2 days    Wellington Hampshire, [facility] Surgery 12/14/2020, 8:56 AM Please see Amion for pager number during day hours 7:00am-4:30pm

## 2020-12-15 LAB — GLUCOSE, CAPILLARY
Glucose-Capillary: 106 mg/dL — ABNORMAL HIGH (ref 70–99)
Glucose-Capillary: 117 mg/dL — ABNORMAL HIGH (ref 70–99)
Glucose-Capillary: 119 mg/dL — ABNORMAL HIGH (ref 70–99)
Glucose-Capillary: 130 mg/dL — ABNORMAL HIGH (ref 70–99)

## 2020-12-15 NOTE — Discharge Summary (Signed)
Physician Discharge Summary  Rachel Vang PZW:[id number] DOB: October 15, 1965 DOA: 12/11/2020  PCP: Patient, No Pcp Per (Inactive)  Admit date: 12/11/2020 Discharge date: 12/15/2020  Admitted From: SNF Disposition:  SNF  Recommendations for Outpatient Follow-up:  Follow up with PCP in 1-2 weeks Please obtain BMP/CBC in one week Please follow up with surgery as scheduled  Discharge Condition:Stable  CODE STATUS:Full  Diet recommendation: Peg tube feeds per dietary    Brief/Interim Summary: Rachel Vang is a 55 y.o. female with a hsitory of PE/DVT, seizures, diabetes mellitus type 2, hypertension, hyperlipidemia, SAH, left hemiparesis, IBS, dysphagia s/p PEG tube, non-ambulatory, CHF. Patient presented secondary to abdominal pain and found to have evidence of a small bowel obstruction. General surgery consulted on admission. Enemas given with improvement of symptoms.   Assessment & Plan:   Partial small bowel obstruction (HCC), resolved -Surgery following, appreciate insight and recommendations  -Bowel movement/flatus noted overnight, advancing PEG tube feedings back to baseline    Lactic acidosis, resolved -Secondary to dehydration.  Continue increased PEG tube feeds and free water flushes per dietary   Hypernatremia -Hypovolemic in the setting of small bowel obstruction, resolving with increased PEG tube feeds and free water flushes    Essential hypertension, benign -Currently normotensive, continue to hold metoprolol losartan given well-controlled blood pressure currently  -Can be resumed in the outpatient setting if blood pressure continues to elevate back to previous levels    DVT (deep venous thrombosis) (HCC) -Transition off heparin back to Eliquis given no surgical intervention per discussion with surgery    Seizures (HCC) -Transition back to enteral Keppra    Discharge Instructions   Allergies as of 12/15/2020   No Known Allergies      Medication List      TAKE these medications    acetaminophen 325 MG tablet Commonly known as: TYLENOL Take 650 mg by mouth every 6 (six) hours.   apixaban 5 MG Tabs tablet Commonly known as: ELIQUIS Take 5 mg by mouth daily.   bethanechol 10 MG tablet Commonly known as: URECHOLINE Take 10 mg by mouth 3 (three) times daily.   docusate sodium 100 MG capsule Commonly known as: COLACE Take 100 mg by mouth daily.   gabapentin 100 MG capsule Commonly known as: NEURONTIN Take 200 mg by mouth 2 (two) times daily.   levETIRAcetam 100 MG/ML solution Commonly known as: KEPPRA Take 7.5 mLs (750 mg total) by mouth 2 (two) times daily.   losartan 25 MG tablet Commonly known as: COZAAR Take 1 tablet (25 mg total) by mouth daily.   melatonin 3 MG Tabs tablet Take 6 mg by mouth at bedtime.   metoprolol tartrate 25 mg/10 mL Susp Commonly known as: LOPRESSOR Take 20 mLs (50 mg total) by mouth 2 (two) times daily.   NON FORMULARY Apply 1 application topically 2 (two) times daily. Hand palm protector   NUTRITIONAL SUPPLEMENT PO Take 237 mLs by mouth 2 (two) times daily. Ensure   feeding supplement (JEVITY 1.2 CAL) Liqd Place 1,000 mLs into feeding tube continuous. 22ml per hour per G tube continuously- start infusion daily at 0700 and stop at 2300   polyethylene glycol 17 g packet Commonly known as: MIRALAX / GLYCOLAX Take 17 g by mouth daily.   PROBIOTIC DAILY PO Take 1 capsule by mouth 2 (two) times daily.   rosuvastatin 5 MG tablet Commonly known as: CRESTOR Take 5 mg by mouth daily.   sertraline 50 MG tablet Commonly known as: ZOLOFT Take 50  mg by mouth daily.   traZODone 100 MG tablet Commonly known as: DESYREL Take 100 mg by mouth at bedtime.   urea 40 % Crea Commonly known as: CARMOL Apply 1 application topically daily. To both feet        No Known Allergies  Consultations: General Surgery  Procedures/Studies: CT Abdomen Pelvis W Contrast  Result Date:  12/12/2020 CLINICAL DATA:  Abdominal pain, peg tube replacement today EXAM: CT ABDOMEN AND PELVIS WITH CONTRAST TECHNIQUE: Multidetector CT imaging of the abdomen and pelvis was performed using the standard protocol following bolus administration of intravenous contrast. CONTRAST:  88mL OMNIPAQUE IOHEXOL 350 MG/ML SOLN COMPARISON:  Abdominal radiograph dated 12/11/2020. CT dated 04/13/2020. CT abdomen/pelvis dated 09/06/2017. FINDINGS: Lower chest: Mild ground-glass opacity/atelectasis in the lingula. Mild dependent atelectasis at the right lung base. Hepatobiliary: Liver is within normal limits. Gallbladder is unremarkable. No intrahepatic or extrahepatic dilatation. Pancreas: Within normal limits. Spleen: Within normal limits. Adrenals/Urinary Tract: Adrenal glands are within normal limits. Kidneys are within normal limits.  No hydronephrosis. Bladder is within normal limits. Stomach/Bowel: Gastrostomy in the stomach. Streak artifact in the left upper abdomen from Gastrografin administered via indwelling gastrostomy tube. Dilated loops of small bowel in the left upper abdomen, with possible transition in the left mid abdomen (series 3/image 62). Colon is not decompressed. However, this appearance raises concern for partial small bowel obstruction. Notably, there was a similar appearance (although with more marked dilatation) on prior CT abdomen study in March (the study did not include the pelvis). No pneumatosis. Mild rectal stool burden (series 3/image 81), suggesting mild fecal impaction. Vascular/Lymphatic: No evidence of abdominal aortic aneurysm. Atherosclerotic calcifications of the abdominal aorta and branch vessels. No suspicious abdominopelvic lymphadenopathy. Reproductive: Status post hysterectomy. No adnexal masses. Other: No abdominopelvic ascites. No free air. Musculoskeletal: Visualized osseous structures are within normal limits. IMPRESSION: Dilated loops of small bowel in the left upper abdomen,  with possible transition in the left mid abdomen, raising concern for partial small bowel obstruction. Adynamic small bowel ileus is also possible but considered less likely. No pneumatosis.  No abdominopelvic ascites.  No free air. Gastrostomy in the stomach, in satisfactory position. Electronically Signed   By: Julian Hy M.D.   On: 12/12/2020 01:26   DG ABDOMEN PEG TUBE LOCATION  Result Date: 12/11/2020 CLINICAL DATA:  Abdominal pain. EXAM: ABDOMEN - 1 VIEW COMPARISON:  CT 04/13/2020 FINDINGS: Portable AP supine view of the abdomen obtained. There is contrast opacifying gastrostomy tubing as well as opacifying the stomach. Type and amount of contrast is not specified. No evidence of extravasation or leak. Dilated small bowel in the central abdomen, similar to prior CT. A stool ball distends the rectum. No visualized radiopaque calculi. IMPRESSION: 1. Gastrostomy tube well positioned within the stomach without evidence of extravasation or leak. 2. Gaseous small bowel distention in the central abdomen, also seen on prior CT in March. Electronically Signed   By: Keith Rake M.D.   On: 12/11/2020 23:19   DG Abd Portable 1V  Result Date: 12/12/2020 CLINICAL DATA:  Follow-up small bowel obstruction. EXAM: PORTABLE ABDOMEN - 1 VIEW COMPARISON:  09/11/2017 and CT of the abdomen and pelvis on 12/12/2020 FINDINGS: Numerous dilated loops of bowel are identified throughout the abdomen and pelvis. Contrast is identified within nondilated distal colon, consistent with partial small bowel obstruction. Gastrostomy tube overlies the LEFT UPPER QUADRANT. There is no free intraperitoneal air. IMPRESSION: Persistent small bowel dilatation. Findings consistent with partial small bowel obstruction. Electronically Signed  By: Nolon Nations M.D.   On: 12/12/2020 10:14     Subjective: No acute issues/events overnight   Discharge Exam: Vitals:   12/14/20 2016 12/15/20 0432  BP: 125/88 112/88  Pulse: 82  75  Resp: 16 16  Temp: 98.8 F (37.1 C) 98.6 F (37 C)  SpO2: 99% 97%   Vitals:   12/14/20 1336 12/14/20 2016 12/15/20 0432 12/15/20 0500  BP: (!) 142/86 125/88 112/88   Pulse: 65 82 75   Resp: 14 16 16    Temp: 98 F (36.7 C) 98.8 F (37.1 C) 98.6 F (37 C)   TempSrc:  Oral Oral   SpO2: 99% 99% 97%   Weight:    57.4 kg  Height:        General: Pt is alert, awake, not in acute distress Cardiovascular: RRR, S1/S2 +, no rubs, no gallops Respiratory: CTA bilaterally, no wheezing, no rhonchi Abdominal: Soft, NT, ND, bowel sounds + PEG tube insertion site clean/dry/intact Extremities: no edema, no cyanosis    The results of significant diagnostics from this hospitalization (including imaging, microbiology, ancillary and laboratory) are listed below for reference.     Microbiology: Recent Results (from the past 240 hour(s))  Resp Panel by RT-PCR (Flu A&B, Covid) Nasopharyngeal Swab     Status: None   Collection Time: 12/11/20 10:54 PM   Specimen: Nasopharyngeal Swab; Nasopharyngeal(NP) swabs in vial transport medium  Result Value Ref Range Status   SARS Coronavirus 2 by RT PCR NEGATIVE NEGATIVE Final    Comment: (NOTE) SARS-CoV-2 target nucleic acids are NOT DETECTED.  The SARS-CoV-2 RNA is generally detectable in upper respiratory specimens during the acute phase of infection. The lowest concentration of SARS-CoV-2 viral copies this assay can detect is 138 copies/mL. A negative result does not preclude SARS-Cov-2 infection and should not be used as the sole basis for treatment or other patient management decisions. A negative result may occur with  improper specimen collection/handling, submission of specimen other than nasopharyngeal swab, presence of viral mutation(s) within the areas targeted by this assay, and inadequate number of viral copies(<138 copies/mL). A negative result must be combined with clinical observations, patient history, and  epidemiological information. The expected result is Negative.  Fact Sheet for Patients:  EntrepreneurPulse.com.au  Fact Sheet for Healthcare Providers:  IncredibleEmployment.be  This test is no t yet approved or cleared by the [place] FDA and  has been authorized for detection and/or diagnosis of SARS-CoV-2 by FDA under an Emergency Use Authorization (EUA). This EUA will remain  in effect (meaning this test can be used) for the duration of the COVID-19 declaration under Section 564(b)(1) of the Act, 21 U.S.C.section 360bbb-3(b)(1), unless the authorization is terminated  or revoked sooner.       Influenza A by PCR NEGATIVE NEGATIVE Final   Influenza B by PCR NEGATIVE NEGATIVE Final    Comment: (NOTE) The Xpert Xpress SARS-CoV-2/FLU/RSV plus assay is intended as an aid in the diagnosis of influenza from Nasopharyngeal swab specimens and should not be used as a sole basis for treatment. Nasal washings and aspirates are unacceptable for Xpert Xpress SARS-CoV-2/FLU/RSV testing.  Fact Sheet for Patients: EntrepreneurPulse.com.au  Fact Sheet for Healthcare Providers: IncredibleEmployment.be  This test is not yet approved or cleared by the [place] FDA and has been authorized for detection and/or diagnosis of SARS-CoV-2 by FDA under an Emergency Use Authorization (EUA). This EUA will remain in effect (meaning this test can be used) for the duration of the COVID-19 declaration  under Section 564(b)(1) of the Act, 21 U.S.C. section 360bbb-3(b)(1), unless the authorization is terminated or revoked.  Performed at [facility], [place] [address], [place], [place] [address]   MRSA Next Gen by PCR, Nasal     Status: Abnormal   Collection Time: 12/12/20 11:09 AM   Specimen: Nasal Mucosa; Nasal Swab  Result Value Ref Range Status   MRSA by PCR Next Gen DETECTED (A) NOT DETECTED Final     Comment: RESULT CALLED TO, READ BACK BY AND VERIFIED WITH: ABERNATHY,A. RN AT 1700 12/12/20 MULLINS,T (NOTE) The GeneXpert MRSA Assay (FDA approved for NASAL specimens only), is one component of a comprehensive MRSA colonization surveillance program. It is not intended to diagnose MRSA infection nor to guide or monitor treatment for MRSA infections. Test performance is not FDA approved in patients less than 51 years old. Performed at [facility], [place] [address], [place], [place] [address]      Labs: BNP (last 3 results) No results for input(s): BNP in the last 8760 hours. Basic Metabolic Panel: Recent Labs  Lab 12/11/20 2254 12/12/20 0406 12/13/20 1844 12/14/20 0338  NA 146* 151* 146* 142  K 3.8 4.1 2.8* 2.8*  CL 105 113* 114* 108  CO2 28 28 26 26   GLUCOSE 130* 98 84 135*  BUN 51* 39* 16 13  CREATININE 0.92 0.83 0.73 0.50  CALCIUM 10.6* 8.7* 8.9 9.3   Liver Function Tests: Recent Labs  Lab 12/11/20 2254  AST 19  ALT 13  ALKPHOS 110  BILITOT 0.4  PROT 9.6*  ALBUMIN 4.6   Recent Labs  Lab 12/11/20 2254  LIPASE 62*   No results for input(s): AMMONIA in the last 168 hours. CBC: Recent Labs  Lab 12/11/20 2254 12/12/20 0406 12/13/20 1844 12/14/20 0338  WBC 13.7* 13.6* 7.5 8.4  NEUTROABS 11.3*  --   --   --   HGB 14.1 12.0 10.2* 10.5*  HCT 44.5 38.7 32.3* 33.3*  MCV 92.9 97.2 96.1 93.3  PLT 291 226 186 180   Cardiac Enzymes: No results for input(s): CKTOTAL, CKMB, CKMBINDEX, TROPONINI in the last 168 hours. BNP: Invalid input(s): POCBNP CBG: Recent Labs  Lab 12/14/20 1643 12/14/20 2014 12/15/20 0022 12/15/20 0430 12/15/20 0725  GLUCAP 125* 126* 130* 117* 119*   D-Dimer No results for input(s): DDIMER in the last 72 hours. Hgb A1c No results for input(s): HGBA1C in the last 72 hours. Lipid Profile No results for input(s): CHOL, HDL, LDLCALC, TRIG, CHOLHDL, LDLDIRECT in the last 72 hours. Thyroid function studies No  results for input(s): TSH, T4TOTAL, T3FREE, THYROIDAB in the last 72 hours.  Invalid input(s): FREET3 Anemia work up No results for input(s): VITAMINB12, FOLATE, FERRITIN, TIBC, IRON, RETICCTPCT in the last 72 hours. Urinalysis    Component Value Date/Time   COLORURINE AMBER (A) 12/12/2020 0134   APPEARANCEUR CLEAR 12/12/2020 0134   LABSPEC 1.029 12/12/2020 0134   PHURINE 5.0 12/12/2020 0134   GLUCOSEU NEGATIVE 12/12/2020 0134   HGBUR NEGATIVE 12/12/2020 0134   BILIRUBINUR NEGATIVE 12/12/2020 0134   KETONESUR NEGATIVE 12/12/2020 0134   PROTEINUR 100 (A) 12/12/2020 0134   NITRITE NEGATIVE 12/12/2020 0134   LEUKOCYTESUR NEGATIVE 12/12/2020 0134   Sepsis Labs Invalid input(s): PROCALCITONIN,  WBC,  LACTICIDVEN Microbiology Recent Results (from the past 240 hour(s))  Resp Panel by RT-PCR (Flu A&B, Covid) Nasopharyngeal Swab     Status: None   Collection Time: 12/11/20 10:54 PM   Specimen: Nasopharyngeal Swab; Nasopharyngeal(NP) swabs in  vial transport medium  Result Value Ref Range Status   SARS Coronavirus 2 by RT PCR NEGATIVE NEGATIVE Final    Comment: (NOTE) SARS-CoV-2 target nucleic acids are NOT DETECTED.  The SARS-CoV-2 RNA is generally detectable in upper respiratory specimens during the acute phase of infection. The lowest concentration of SARS-CoV-2 viral copies this assay can detect is 138 copies/mL. A negative result does not preclude SARS-Cov-2 infection and should not be used as the sole basis for treatment or other patient management decisions. A negative result may occur with  improper specimen collection/handling, submission of specimen other than nasopharyngeal swab, presence of viral mutation(s) within the areas targeted by this assay, and inadequate number of viral copies(<138 copies/mL). A negative result must be combined with clinical observations, patient history, and epidemiological information. The expected result is Negative.  Fact Sheet for Patients:   EntrepreneurPulse.com.au  Fact Sheet for Healthcare Providers:  IncredibleEmployment.be  This test is no t yet approved or cleared by the [place] FDA and  has been authorized for detection and/or diagnosis of SARS-CoV-2 by FDA under an Emergency Use Authorization (EUA). This EUA will remain  in effect (meaning this test can be used) for the duration of the COVID-19 declaration under Section 564(b)(1) of the Act, 21 U.S.C.section 360bbb-3(b)(1), unless the authorization is terminated  or revoked sooner.       Influenza A by PCR NEGATIVE NEGATIVE Final   Influenza B by PCR NEGATIVE NEGATIVE Final    Comment: (NOTE) The Xpert Xpress SARS-CoV-2/FLU/RSV plus assay is intended as an aid in the diagnosis of influenza from Nasopharyngeal swab specimens and should not be used as a sole basis for treatment. Nasal washings and aspirates are unacceptable for Xpert Xpress SARS-CoV-2/FLU/RSV testing.  Fact Sheet for Patients: EntrepreneurPulse.com.au  Fact Sheet for Healthcare Providers: IncredibleEmployment.be  This test is not yet approved or cleared by the [place] FDA and has been authorized for detection and/or diagnosis of SARS-CoV-2 by FDA under an Emergency Use Authorization (EUA). This EUA will remain in effect (meaning this test can be used) for the duration of the COVID-19 declaration under Section 564(b)(1) of the Act, 21 U.S.C. section 360bbb-3(b)(1), unless the authorization is terminated or revoked.  Performed at [facility], [place] [address], [place], [place] [address]   MRSA Next Gen by PCR, Nasal     Status: Abnormal   Collection Time: 12/12/20 11:09 AM   Specimen: Nasal Mucosa; Nasal Swab  Result Value Ref Range Status   MRSA by PCR Next Gen DETECTED (A) NOT DETECTED Final    Comment: RESULT CALLED TO, READ BACK BY AND VERIFIED WITH: ABERNATHY,A. RN AT 1700 12/12/20  MULLINS,T (NOTE) The GeneXpert MRSA Assay (FDA approved for NASAL specimens only), is one component of a comprehensive MRSA colonization surveillance program. It is not intended to diagnose MRSA infection nor to guide or monitor treatment for MRSA infections. Test performance is not FDA approved in patients less than 19 years old. Performed at [facility], [place] [address] [place], [place] [address]      Time coordinating discharge: Over 30 minutes  SIGNED:   Little Ishikawa, DO [facility] 12/15/2020, 7:40 AM Pager   If 7PM-7AM, please contact night-coverage [email or web address]

## 2020-12-15 NOTE — Progress Notes (Signed)
Called facility and gave report to [facility], all questions answered. Jaymes Graff made aware that Pt has a foley in place and facility will make an assessment whether she needs it and will be removed if she doesn't. MD updated. Pt confirmed that she had foley in place prior to coming to hospital.   Pt not in acute distress, to discharge to facility with belongings via PTAR.

## 2020-12-15 NOTE — TOC Transition Note (Signed)
Transition of Care [facility]) - CM/SW Discharge Note  Patient Details  Name: Rachel Vang MRN: [id number] Date of Birth: Jun 15, 1965  Transition of Care [facility]) CM/SW Contact:  Ewing Schlein, LCSW Phone Number: 12/15/2020, 10:57 AM  Clinical Narrative: Patient medically stable for return back to [facility]. FL2 done. Discharge summary, discharge orders, SNF transfer report, and FL2 faxed to facility in hub. Patient will go to room 230 and the number for  report is [phone]. Medical necessity form done; PTAR scheduled. Discharge packet completed. RN updated. TOC signing off.  Final next level of care: Long Term Nursing Home Barriers to Discharge: Barriers Resolved  Patient Goals and CMS Choice Choice offered to / list presented to : NA  Discharge Placement      Patient chooses bed at: Other - please specify in the comment section below: [facility]) Patient to be transferred to facility by: PTAR Patient and family notified of of transfer: 12/15/20  Discharge Plan and Services In-house Referral: Clinical Social Work Post Acute Care Choice: Nursing Home          DME Arranged: N/A DME Agency: NA  Readmission Risk Interventions No flowsheet data found.

## 2020-12-15 NOTE — NC FL2 (Signed)
[place] MEDICAID FL2 LEVEL OF CARE SCREENING TOOL     IDENTIFICATION  Patient Name: Rachel Vang Birthdate: 08-May-1965 Sex: female Admission Date (Current Location): 12/11/2020  [place] and [place] Number:  Kathleen Argue [id number] [place] and Address:  [facility],  [address] [place], [place]      Provider Number: [id number]  Attending Physician Name and Address:  Little Ishikawa, MD  Relative Name and Phone Number:  Jayia Bohrer (son) Ph: [phone]    Current Level of Care: Hospital Recommended Level of Care: [place] (Patient is long-term care resident at [facility].) Prior Approval Number:    Date Approved/Denied:   PASRR Number:    Discharge Plan: SNF    Current Diagnoses: Patient Active Problem List   Diagnosis Date Noted   Lactic acidosis 12/13/2020   Partial small bowel obstruction ([place]) 12/12/2020   DCM (dilated cardiomyopathy) ([place])    Pericardial effusion    Malnutrition of moderate degree 04/05/2020   Aspiration pneumonia ([place]) 04/05/2020   Sepsis due to pneumonia ([place]) 04/04/2020   Hypokalemia 04/04/2020   Prolonged QT interval 04/04/2020   Hemiplegia as late effect of cerebrovascular accident (CVA) ([place]) 09/19/2017   Fever 09/06/2017   Slow transit constipation 06/08/2017   Insomnia 06/08/2017   Generalized weakness 06/07/2017   Aspiration pneumonia of both lower lobes due to gastric secretions ([place])    Demand ischemia (HCC)    Chronic pain of right knee    Chronic heel pain, right    Small bowel obstruction (HCC)    Inappropriate sinus tachycardia    AKI (acute kidney injury) ([place]) 05/06/2017   Hypernatremia 05/06/2017   Dyslipidemia associated with type 2 diabetes mellitus ([place]) 04/27/2017   Gingivitis, acute, plaque induced 03/26/2017   Hypersecretion of saliva 03/14/2017   Bruxism (teeth grinding) 03/14/2017   Lobar pneumonia ([place])    Acute on chronic respiratory failure with hypoxia  ([place])    Pressure injury of skin 02/16/2017   Foot drop, right foot 10/18/2016   CVA (cerebrovascular accident) ([place]) 08/30/2016   GERD without esophagitis 08/30/2016   Chronic pulmonary embolism ([place]) 07/03/2016   Weight loss, non-intentional 06/13/2016   Type II diabetes mellitus with neurological manifestations ([place]) 05/20/2015   Essential hypertension, benign 05/20/2015   Status post insertion of percutaneous endoscopic gastrostomy (PEG) tube ([place]) 05/20/2015   DVT (deep venous thrombosis) ([place]) 04/05/2015   Protein-calorie malnutrition, severe ([place]) 03/25/2015   Dysphagia 02/18/2015   Seizures ([place]) 02/18/2015   Elevated troponin 02/18/2015   Urine retention 02/18/2015   History of ETT    Subarachnoid hemorrhage (HCC)     Orientation RESPIRATION BLADDER Height & Weight     Self, Time, Situation, Place  Normal Incontinent Weight: 126 lb 8.7 oz (57.4 kg) Height:  5' (152.4 cm)  BEHAVIORAL SYMPTOMS/MOOD NEUROLOGICAL BOWEL NUTRITION STATUS   (N/A) Convulsions/Seizures Incontinent Diet, Feeding tube (Patient is NPO as all feedings are done through the PEG tube.)  AMBULATORY STATUS COMMUNICATION OF NEEDS Skin   Total Care Non-Verbally Skin abrasions, Other (Comment) (Abrasions: bilateral feet and ankles; cracking: bilateral toes)                       Personal Care Assistance Level of Assistance  Bathing, Feeding, Dressing Bathing Assistance: Maximum assistance Feeding assistance: Maximum assistance Dressing Assistance: Maximum assistance     Functional Limitations Info  Sight, Hearing, Speech Sight Info: Adequate Hearing Info: Adequate Speech Info: Impaired (Patient is nonverbal.)  SPECIAL CARE FACTORS FREQUENCY                       Contractures Contractures Info: Not present    Additional Factors Info  Code Status, Insulin Sliding Scale, Allergies Code Status Info: Full Allergies Info: NKA   Insulin Sliding Scale Info: See discharge summary        Current Medications (12/15/2020):  This is the current hospital active medication list Current Facility-Administered Medications  Medication Dose Route Frequency Provider Last Rate Last Admin   apixaban (ELIQUIS) tablet 5 mg  5 mg Oral Daily Azucena Fallen, MD   5 mg at 12/14/20 1415   bisacodyl (DULCOLAX) suppository 10 mg  10 mg Rectal Daily PRN Meuth, Brooke A, PA-C       Chlorhexidine Gluconate Cloth 2 % PADS 6 each  6 each Topical Daily Narda Bonds, MD   6 each at 12/14/20 1009   dextrose 5 % solution   Intravenous Continuous Narda Bonds, MD 75 mL/hr at 12/14/20 2053 New Bag at 12/14/20 2053   docusate (COLACE) 50 MG/5ML liquid 100 mg  100 mg Per Tube BID Meuth, Brooke A, PA-C       feeding supplement (JEVITY 1.2 CAL) liquid 1,000 mL  1,000 mL Per Tube Continuous Azucena Fallen, MD 70 mL/hr at 12/14/20 1649 1,000 mL at 12/14/20 1649   feeding supplement (PROSource TF) liquid 45 mL  45 mL Per Tube Daily Azucena Fallen, MD   45 mL at 12/14/20 1415   free water 30 mL  30 mL Per Tube QID Azucena Fallen, MD   30 mL at 12/14/20 1923   gabapentin (NEURONTIN) capsule 200 mg  200 mg Oral BID Azucena Fallen, MD   200 mg at 12/14/20 2204   insulin aspart (novoLOG) injection 0-6 Units  0-6 Units Subcutaneous Q4H John Giovanni, MD       levETIRAcetam (KEPPRA) 100 MG/ML solution 750 mg  750 mg Oral BID Azucena Fallen, MD   750 mg at 12/14/20 2212   mupirocin ointment (BACTROBAN) 2 % 1 application  1 application Nasal BID Narda Bonds, MD   1 application at 12/14/20 2218   polyethylene glycol (MIRALAX / GLYCOLAX) packet 17 g  17 g Per Tube Daily Meuth, Brooke A, PA-C   17 g at 12/14/20 1011   promethazine (PHENERGAN) 12.5 mg in sodium chloride 0.9 % 50 mL IVPB  12.5 mg Intravenous Q6H PRN John Giovanni, MD 200 mL/hr at 12/13/20 0429 12.5 mg at 12/13/20 0429   rosuvastatin (CRESTOR) tablet 5 mg  5 mg Oral Daily Azucena Fallen, MD   5 mg at  12/14/20 1429   sertraline (ZOLOFT) tablet 50 mg  50 mg Oral Daily Azucena Fallen, MD   50 mg at 12/14/20 1428   traZODone (DESYREL) tablet 100 mg  100 mg Oral QHS Azucena Fallen, MD   100 mg at 12/14/20 2205     Discharge Medications: Please see discharge summary for a list of discharge medications.  Relevant Imaging Results:  Relevant Lab Results:   Additional Information SSN: [id number]  Ewing Schlein, LCSW

## 2020-12-15 NOTE — Plan of Care (Signed)

## 2021-07-07 ENCOUNTER — Emergency Department (HOSPITAL_COMMUNITY): Payer: Medicaid Other

## 2021-07-07 ENCOUNTER — Encounter (HOSPITAL_COMMUNITY): Payer: Self-pay

## 2021-07-07 ENCOUNTER — Inpatient Hospital Stay (HOSPITAL_COMMUNITY): Payer: Medicaid Other

## 2021-07-07 ENCOUNTER — Inpatient Hospital Stay (HOSPITAL_COMMUNITY)
Admission: EM | Admit: 2021-07-07 | Discharge: 2021-07-29 | DRG: 673 | Disposition: E | Payer: Medicaid Other | Attending: Internal Medicine | Admitting: Internal Medicine

## 2021-07-07 DIAGNOSIS — Z87891 Personal history of nicotine dependence: Secondary | ICD-10-CM

## 2021-07-07 DIAGNOSIS — N170 Acute kidney failure with tubular necrosis: Secondary | ICD-10-CM | POA: Diagnosis present

## 2021-07-07 DIAGNOSIS — Z20822 Contact with and (suspected) exposure to covid-19: Secondary | ICD-10-CM | POA: Diagnosis present

## 2021-07-07 DIAGNOSIS — I69051 Hemiplegia and hemiparesis following nontraumatic subarachnoid hemorrhage affecting right dominant side: Secondary | ICD-10-CM

## 2021-07-07 DIAGNOSIS — J690 Pneumonitis due to inhalation of food and vomit: Secondary | ICD-10-CM | POA: Diagnosis present

## 2021-07-07 DIAGNOSIS — Z515 Encounter for palliative care: Secondary | ICD-10-CM | POA: Diagnosis not present

## 2021-07-07 DIAGNOSIS — K559 Vascular disorder of intestine, unspecified: Secondary | ICD-10-CM | POA: Diagnosis present

## 2021-07-07 DIAGNOSIS — D709 Neutropenia, unspecified: Secondary | ICD-10-CM | POA: Diagnosis present

## 2021-07-07 DIAGNOSIS — Z86711 Personal history of pulmonary embolism: Secondary | ICD-10-CM

## 2021-07-07 DIAGNOSIS — A4159 Other Gram-negative sepsis: Secondary | ICD-10-CM | POA: Diagnosis present

## 2021-07-07 DIAGNOSIS — E119 Type 2 diabetes mellitus without complications: Secondary | ICD-10-CM | POA: Diagnosis present

## 2021-07-07 DIAGNOSIS — Z79899 Other long term (current) drug therapy: Secondary | ICD-10-CM

## 2021-07-07 DIAGNOSIS — J80 Acute respiratory distress syndrome: Secondary | ICD-10-CM | POA: Diagnosis present

## 2021-07-07 DIAGNOSIS — G40909 Epilepsy, unspecified, not intractable, without status epilepticus: Secondary | ICD-10-CM | POA: Diagnosis present

## 2021-07-07 DIAGNOSIS — Z66 Do not resuscitate: Secondary | ICD-10-CM | POA: Diagnosis present

## 2021-07-07 DIAGNOSIS — Z86718 Personal history of other venous thrombosis and embolism: Secondary | ICD-10-CM

## 2021-07-07 DIAGNOSIS — A419 Sepsis, unspecified organism: Secondary | ICD-10-CM

## 2021-07-07 DIAGNOSIS — N136 Pyonephrosis: Secondary | ICD-10-CM | POA: Diagnosis present

## 2021-07-07 DIAGNOSIS — K565 Intestinal adhesions [bands], unspecified as to partial versus complete obstruction: Secondary | ICD-10-CM | POA: Diagnosis present

## 2021-07-07 DIAGNOSIS — E870 Hyperosmolality and hypernatremia: Secondary | ICD-10-CM | POA: Diagnosis present

## 2021-07-07 DIAGNOSIS — Y846 Urinary catheterization as the cause of abnormal reaction of the patient, or of later complication, without mention of misadventure at the time of the procedure: Secondary | ICD-10-CM | POA: Diagnosis present

## 2021-07-07 DIAGNOSIS — N289 Disorder of kidney and ureter, unspecified: Secondary | ICD-10-CM | POA: Diagnosis not present

## 2021-07-07 DIAGNOSIS — E872 Acidosis, unspecified: Secondary | ICD-10-CM | POA: Diagnosis present

## 2021-07-07 DIAGNOSIS — Z7901 Long term (current) use of anticoagulants: Secondary | ICD-10-CM

## 2021-07-07 DIAGNOSIS — G9341 Metabolic encephalopathy: Secondary | ICD-10-CM | POA: Diagnosis present

## 2021-07-07 DIAGNOSIS — Z9911 Dependence on respirator [ventilator] status: Secondary | ICD-10-CM | POA: Diagnosis not present

## 2021-07-07 DIAGNOSIS — T83518A Infection and inflammatory reaction due to other urinary catheter, initial encounter: Secondary | ICD-10-CM | POA: Diagnosis present

## 2021-07-07 DIAGNOSIS — R6521 Severe sepsis with septic shock: Secondary | ICD-10-CM | POA: Diagnosis present

## 2021-07-07 DIAGNOSIS — I10 Essential (primary) hypertension: Secondary | ICD-10-CM | POA: Diagnosis present

## 2021-07-07 DIAGNOSIS — Z7401 Bed confinement status: Secondary | ICD-10-CM

## 2021-07-07 DIAGNOSIS — B9562 Methicillin resistant Staphylococcus aureus infection as the cause of diseases classified elsewhere: Secondary | ICD-10-CM | POA: Diagnosis present

## 2021-07-07 DIAGNOSIS — Z8249 Family history of ischemic heart disease and other diseases of the circulatory system: Secondary | ICD-10-CM

## 2021-07-07 DIAGNOSIS — K219 Gastro-esophageal reflux disease without esophagitis: Secondary | ICD-10-CM | POA: Diagnosis present

## 2021-07-07 DIAGNOSIS — Z931 Gastrostomy status: Secondary | ICD-10-CM

## 2021-07-07 DIAGNOSIS — J982 Interstitial emphysema: Secondary | ICD-10-CM | POA: Diagnosis not present

## 2021-07-07 DIAGNOSIS — J9601 Acute respiratory failure with hypoxia: Secondary | ICD-10-CM | POA: Diagnosis not present

## 2021-07-07 DIAGNOSIS — K56609 Unspecified intestinal obstruction, unspecified as to partial versus complete obstruction: Secondary | ICD-10-CM | POA: Diagnosis not present

## 2021-07-07 DIAGNOSIS — O85 Puerperal sepsis: Secondary | ICD-10-CM | POA: Diagnosis not present

## 2021-07-07 DIAGNOSIS — E785 Hyperlipidemia, unspecified: Secondary | ICD-10-CM | POA: Diagnosis present

## 2021-07-07 DIAGNOSIS — R4182 Altered mental status, unspecified: Secondary | ICD-10-CM | POA: Diagnosis not present

## 2021-07-07 LAB — CBC
HCT: 40.7 % (ref 36.0–46.0)
Hemoglobin: 12.4 g/dL (ref 12.0–15.0)
MCH: 31.2 pg (ref 26.0–34.0)
MCHC: 30.5 g/dL (ref 30.0–36.0)
MCV: 102.5 fL — ABNORMAL HIGH (ref 80.0–100.0)
Platelets: 234 10*3/uL (ref 150–400)
RBC: 3.97 MIL/uL (ref 3.87–5.11)
RDW: 16.3 % — ABNORMAL HIGH (ref 11.5–15.5)
WBC: 6.3 10*3/uL (ref 4.0–10.5)
nRBC: 0.3 % — ABNORMAL HIGH (ref 0.0–0.2)

## 2021-07-07 LAB — CBC WITH DIFFERENTIAL/PLATELET
Abs Immature Granulocytes: 0.09 10*3/uL — ABNORMAL HIGH (ref 0.00–0.07)
Basophils Absolute: 0 10*3/uL (ref 0.0–0.1)
Basophils Relative: 0 %
Eosinophils Absolute: 0 10*3/uL (ref 0.0–0.5)
Eosinophils Relative: 0 %
HCT: 41 % (ref 36.0–46.0)
Hemoglobin: 12 g/dL (ref 12.0–15.0)
Immature Granulocytes: 1 %
Lymphocytes Relative: 15 %
Lymphs Abs: 1.4 10*3/uL (ref 0.7–4.0)
MCH: 30.7 pg (ref 26.0–34.0)
MCHC: 29.3 g/dL — ABNORMAL LOW (ref 30.0–36.0)
MCV: 104.9 fL — ABNORMAL HIGH (ref 80.0–100.0)
Monocytes Absolute: 0.7 10*3/uL (ref 0.1–1.0)
Monocytes Relative: 7 %
Neutro Abs: 6.8 10*3/uL (ref 1.7–7.7)
Neutrophils Relative %: 77 %
Platelets: 225 10*3/uL (ref 150–400)
RBC: 3.91 MIL/uL (ref 3.87–5.11)
RDW: 16.5 % — ABNORMAL HIGH (ref 11.5–15.5)
WBC: 8.9 10*3/uL (ref 4.0–10.5)
nRBC: 0.2 % (ref 0.0–0.2)

## 2021-07-07 LAB — I-STAT ARTERIAL BLOOD GAS, ED
Acid-base deficit: 11 mmol/L — ABNORMAL HIGH (ref 0.0–2.0)
Bicarbonate: 15.9 mmol/L — ABNORMAL LOW (ref 20.0–28.0)
Calcium, Ion: 1.16 mmol/L (ref 1.15–1.40)
HCT: 33 % — ABNORMAL LOW (ref 36.0–46.0)
Hemoglobin: 11.2 g/dL — ABNORMAL LOW (ref 12.0–15.0)
O2 Saturation: 96 %
Patient temperature: 103.3
Potassium: 5.2 mmol/L — ABNORMAL HIGH (ref 3.5–5.1)
Sodium: 148 mmol/L — ABNORMAL HIGH (ref 135–145)
TCO2: 17 mmol/L — ABNORMAL LOW (ref 22–32)
pCO2 arterial: 44.1 mmHg (ref 32–48)
pH, Arterial: 7.179 — CL (ref 7.35–7.45)
pO2, Arterial: 119 mmHg — ABNORMAL HIGH (ref 83–108)

## 2021-07-07 LAB — RESP PANEL BY RT-PCR (FLU A&B, COVID) ARPGX2
Influenza A by PCR: NEGATIVE
Influenza B by PCR: NEGATIVE
SARS Coronavirus 2 by RT PCR: NEGATIVE

## 2021-07-07 LAB — CREATININE, SERUM
Creatinine, Ser: 5.3 mg/dL — ABNORMAL HIGH (ref 0.44–1.00)
GFR, Estimated: 9 mL/min — ABNORMAL LOW (ref >60)

## 2021-07-07 LAB — PROTIME-INR
INR: 2.6 — ABNORMAL HIGH (ref 0.8–1.2)
Prothrombin Time: 27.6 s — ABNORMAL HIGH (ref 11.4–15.2)

## 2021-07-07 LAB — APTT: aPTT: 36 seconds (ref 24–36)

## 2021-07-07 LAB — I-STAT BETA HCG BLOOD, ED (MC, WL, AP ONLY): I-stat hCG, quantitative: <5 m[IU]/mL (ref <5)

## 2021-07-07 MED ORDER — VANCOMYCIN HCL IN DEXTROSE 1-5 GM/200ML-% IV SOLN
1000.0000 mg | Freq: Once | INTRAVENOUS | Status: AC
  Administered 2021-07-07: 1000 mg via INTRAVENOUS
  Filled 2021-07-07: qty 200

## 2021-07-07 MED ORDER — DOCUSATE SODIUM 50 MG/5ML PO LIQD
100.0000 mg | Freq: Two times a day (BID) | ORAL | Status: DC | PRN
Start: 2021-07-07 — End: 2021-07-08

## 2021-07-07 MED ORDER — FENTANYL CITRATE PF 50 MCG/ML IJ SOSY: 50.0000 ug | PREFILLED_SYRINGE | INTRAMUSCULAR | Status: DC | PRN

## 2021-07-07 MED ORDER — NOREPINEPHRINE 4 MG/250ML-% IV SOLN
0.0000 ug/min | INTRAVENOUS | Status: DC
  Administered 2021-07-07: 4 ug/min via INTRAVENOUS
  Administered 2021-07-08: 40 ug/min via INTRAVENOUS
  Filled 2021-07-07 (×2): qty 250

## 2021-07-07 MED ORDER — LACTATED RINGERS IV SOLN: INTRAVENOUS | Status: DC

## 2021-07-07 MED ORDER — NOREPINEPHRINE 4 MG/250ML-% IV SOLN: 0.0000 ug/min | INTRAVENOUS | Status: DC

## 2021-07-07 MED ORDER — ETOMIDATE 2 MG/ML IV SOLN
INTRAVENOUS | Status: DC | PRN
  Administered 2021-07-07: 20 mg via INTRAVENOUS

## 2021-07-07 MED ORDER — LACTATED RINGERS IV BOLUS (SEPSIS)
1000.0000 mL | Freq: Once | INTRAVENOUS | Status: AC
  Administered 2021-07-07: 1000 mL via INTRAVENOUS

## 2021-07-07 MED ORDER — PIPERACILLIN-TAZOBACTAM 3.375 G IVPB 30 MIN
3.3750 g | Freq: Once | INTRAVENOUS | Status: AC
Start: 2021-07-07 — End: 2021-07-07
  Administered 2021-07-07: 3.375 g via INTRAVENOUS
  Filled 2021-07-07: qty 50

## 2021-07-07 MED ORDER — PANTOPRAZOLE SODIUM 40 MG IV SOLR
40.0000 mg | Freq: Every day | INTRAVENOUS | Status: DC
  Administered 2021-07-07: 40 mg via INTRAVENOUS
  Filled 2021-07-07: qty 10

## 2021-07-07 MED ORDER — SODIUM CHLORIDE 0.9 % IV SOLN
INTRAVENOUS | Status: DC | PRN
  Administered 2021-07-07: 1000 mL via INTRAVENOUS
  Administered 2021-07-08: 250 mL via INTRAVENOUS
  Administered 2021-07-09: 500 mL via INTRAVENOUS

## 2021-07-07 MED ORDER — POLYETHYLENE GLYCOL 3350 17 G PO PACK: 17.0000 g | PACK | Freq: Every day | ORAL | Status: DC | PRN

## 2021-07-07 MED ORDER — FENTANYL CITRATE PF 50 MCG/ML IJ SOSY
50.0000 ug | PREFILLED_SYRINGE | INTRAMUSCULAR | Status: DC | PRN
  Administered 2021-07-08: 50 ug via INTRAVENOUS
  Filled 2021-07-07: qty 1

## 2021-07-07 MED ORDER — NOREPINEPHRINE 4 MG/250ML-% IV SOLN
INTRAVENOUS | Status: DC | PRN
  Administered 2021-07-07: 2 ug/min via INTRAVENOUS

## 2021-07-07 MED ORDER — ACETAMINOPHEN 650 MG RE SUPP
650.0000 mg | Freq: Once | RECTAL | Status: AC
  Administered 2021-07-07: 650 mg via RECTAL

## 2021-07-07 MED ORDER — SUCCINYLCHOLINE CHLORIDE 20 MG/ML IJ SOLN
INTRAMUSCULAR | Status: DC | PRN
  Administered 2021-07-07: 120 mg via INTRAVENOUS

## 2021-07-07 MED ORDER — PANTOPRAZOLE 2 MG/ML SUSPENSION: 40.0000 mg | Freq: Every day | ORAL | Status: DC

## 2021-07-07 MED ORDER — PHENYLEPHRINE 80 MCG/ML (10ML) SYRINGE FOR IV PUSH (FOR BLOOD PRESSURE SUPPORT)
PREFILLED_SYRINGE | INTRAVENOUS | Status: DC | PRN
Start: 2021-07-07 — End: 2021-07-07
  Administered 2021-07-07: 160 ug via INTRAVENOUS

## 2021-07-07 MED ORDER — FENTANYL CITRATE PF 50 MCG/ML IJ SOSY
50.0000 ug | PREFILLED_SYRINGE | INTRAMUSCULAR | Status: DC | PRN
  Administered 2021-07-07: 100 ug via INTRAVENOUS
  Filled 2021-07-07: qty 2

## 2021-07-07 MED ORDER — SODIUM CHLORIDE 0.9 % IV SOLN
2.0000 g | Freq: Once | INTRAVENOUS | Status: DC
  Filled 2021-07-07: qty 12.5

## 2021-07-07 MED ORDER — VASOPRESSIN 20 UNITS/100 ML INFUSION FOR SHOCK
0.0000 [IU]/min | INTRAVENOUS | Status: DC
  Administered 2021-07-07: 0.03 [IU]/min via INTRAVENOUS
  Filled 2021-07-07 (×2): qty 100

## 2021-07-07 MED ORDER — LACTATED RINGERS IV BOLUS (SEPSIS)
1000.0000 mL | Freq: Once | INTRAVENOUS | Status: AC
  Administered 2021-07-08: 1000 mL via INTRAVENOUS

## 2021-07-07 MED ORDER — SODIUM CHLORIDE 0.9 % IV SOLN
INTRAVENOUS | Status: DC | PRN
  Administered 2021-07-08: 500 mL via INTRA_ARTERIAL

## 2021-07-07 MED ORDER — LACTATED RINGERS IV BOLUS
1000.0000 mL | Freq: Once | INTRAVENOUS | Status: AC
Start: 2021-07-07 — End: 2021-07-08
  Administered 2021-07-07: 1000 mL via INTRAVENOUS

## 2021-07-07 MED ORDER — METRONIDAZOLE 500 MG/100ML IV SOLN
500.0000 mg | Freq: Once | INTRAVENOUS | Status: DC
  Filled 2021-07-07: qty 100

## 2021-07-07 MED ORDER — HEPARIN SODIUM (PORCINE) 5000 UNIT/ML IJ SOLN: 5000.0000 [IU] | Freq: Three times a day (TID) | INTRAMUSCULAR | Status: DC

## 2021-07-07 MED ORDER — LACTATED RINGERS IV SOLN
INTRAVENOUS | Status: DC
Start: 2021-07-07 — End: 2021-07-08

## 2021-07-07 MED ORDER — INSULIN ASPART 100 UNIT/ML IJ SOLN: 1.0000 [IU] | INTRAMUSCULAR | Status: DC

## 2021-07-07 NOTE — Code Documentation (Signed)
Pt intubated by Dr. Deretha Emory

## 2021-07-07 NOTE — ED Triage Notes (Addendum)
PER EMS: pt is from nursing home, was found unresponsive by staff, possible aspiration. HR-180s, cardioverted by EMS. BP-78 systolic. RR-40s, unable to secure airway in field. Pt arrives unresponsive and gray

## 2021-07-07 NOTE — ED Notes (Signed)
Having trouble obtaining blood; attempted multiple times by this RN and Teodora Medici, RN. EDP and critical are aware.

## 2021-07-07 NOTE — H&P (Signed)
NAME:  Rachel Vang, MRN:  [id number], DOB:  1965-06-15, LOS: 0 ADMISSION DATE:  07/24/2021, CONSULTATION DATE: 07/26/2021 REFERRING MD:  Bethann Humble, CHIEF COMPLAINT: Acute respiratory failure  History of Present Illness:  Rachel Vang is a 56 year old woman with a history of SAH who is bedbound at baseline and mostly dependent on PEG tube who presents after being found unresponsive at [facility] facility.  EMS was unable to intubate her despite severe hypoxia due to poor mouth opening.  She required cardioversion for tachycardia with heart rates in the 180s.  In the ED she was intubated after RSI medications were given, although she was a difficult airway.  She had dark brown thin liquid suctioned from her NG tube and from her airway.  She remains tachycardic, hypotensive, tachypneic.  She was started on peripheral norepinephrine for hypotension.  Her son indicated that at baseline she is bedbound, but is able to communicate, uses her tablet, and frequently watches TV.  She sometimes eats, but still has a PEG tube.  She has a history of previous tracheostomy.  Previously been on apixaban for history of pulmonary embolism.  She is a chronic Foley catheter since her most recent hospital admission several months ago.  She has a history of GU bleeding, LABA as recent as a month ago that had been attributed to her chronic anticoagulation.  Her son says this is resolved.  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 rate (!) 30, height 5\' 8"  (1.727  m), SpO2 91 %.    Vent Mode: PRVC FiO2 (%):  [100 %] 100 % Set Rate:  [18 bmp-22 bmp] 22 bmp Vt Set:  [500 mL-510 mL] 510 mL PEEP:  [5 cmH20] 5 cmH20 Plateau Pressure:  [21 cmH20] 21 cmH20   Intake/Output Summary (Last 24 hours) at 07/04/2021 2312 Last data filed at 07/04/2021 2307 Gross per 24 hour  Intake 1002.98 ml  Output --  Net 1002.98 ml   There were no vitals filed for this visit.  Examination: General: Critically ill-appearing woman lying in bed intubated, not sedated  HENT: [place]/AT, eyes anicteric. Poor dentition, dental carries. Brown thin liquid in mouth. Lungs: Coarse rales bilaterally, minimal thin brown secretions from lungs.  Tachypneic, breathing over the vent.  Unable to assess plateau pressure Cardiovascular: S1-S2, regular rhythm, tachycardic Abdomen: Soft, nontender.  PEG tube. Extremities: No peripheral edema, minimal muscle mass Neuro: Unresponsive, pupils reactive, no significant gag reflex.  Breathing over the vent. GU: Foley from nursing home in place with minimal urine output  ABG    Component Value Date/Time   PHART 7.179 (LL) 07/24/2021 2243   PCO2ART 44.1 07/06/2021 2243   PO2ART 119 (H) 07/05/2021 2243   HCO3 15.9 (L) 07/05/2021 2243   TCO2 17 (L) 07/01/2021 2243   ACIDBASEDEF 11.0 (H) 07/09/2021 2243   O2SAT 96 07/20/2021 2243   WBC 8.9 H/H 12/41 Platelets 225 Cr 5  CXR personally reviewed > ETT in appropriate position, LLL, RUL infiltrates. Elevated R hemidiaphragm. Dilated loops of bowel.  Resolved Hospital Problem list     Assessment & Plan:  Acute respiratory failure with hypoxia due to aspiration pneumonia versus pneumonitis bilaterally ARDS Previous tracheostomy, now decannulated -Low tidal volume ventilation - PAD protocol for sedation; fentanyl as needed only at this point -VAP prevention protocol - Empiric Zosyn, with history of aspiration unlikely that she requires atypical coverage.  Vanc also given in the ED.  MRSA  swab. -COVID swab and precautions per protocol - Daily SAT and SBT as appropriate -Titrate PEEP and FiO2 per ARDS protocol.   -If P:F ratio remains low she may require prone positioning or NMB  Septic shock due to aspiration pneumonia versus pneumonitis - Antibiotics - Volume resuscitation per protocol - Vasopressors as required to maintain MAP greater than 65 - Placing CVC to facilitate getting labs and administering medications including vasopressors.  Consent obtained from son.  AKI, concern for prerenal vs ATN from septic shock -additional 1L IVF, LR gtt overnight -strict I/OS -renally dose meds, avoid nephrotoxic meds -change foley, then collect UA, urine culture  Acute encephalopathy- cause unclear.  Concern this is due to sepsis or hypoxia.  It is not impossible that she has had a seizure or another stroke. Has history of seizures, not yet sure what seizure medications she is on prior to admission. - PAD protocol for sedation - Head CT - EEG - Discussed with her son that although stroke is possible, she is not stable enough to go for head MRI tonight and there are other potential causes that are more likely to explain her altered mental status.  She may require an MRI in the future. -Pharmacy tech working on getting accurate med list to resume PTA AEDs.  History of previous subarachnoid hemorrhage with residual right hemiparesis.  Bedbound at baseline. -Ongoing goals of care discussions throughout the admission -No need for PT and OT at this point  Vomiting - Hold tube feeds for now --CT abd pelvis due to dilated loops of bowel on CXR  Diabetes -SSI as needed - Goal BG 140-180 -We will defer decision on need for long-acting insulin based on home med list  History of DVT and PE - Heparin, hold PTA Eliquis  I discussed goals of care with Rachel Vang's sons, 1 in person and 1 over the phone.  They indicated that she would want to be full code with resuscitation.  We  discussed that if she were to require CPR she likely would have return of her previous stroke deficits that she is recovered from and would struggle to return to her previous baseline given comorbidities and previous deficits.  Continue aggressive care.   Best Practice (right click and "Reselect all SmartList Selections" daily)   Diet/type: NPO DVT prophylaxis: systemic heparin GI prophylaxis: PPI Lines: Central line Foley:  Yes, and it is still needed Code Status:  full code Last date of multidisciplinary goals of care discussion [sons updated on 6/9]  Labs   CBC: Recent Labs  Lab 07/22/2021 2241 06/30/2021 2243  WBC 8.9  --   NEUTROABS PENDING  --   HGB 12.0 11.2*  HCT 41.0 33.0*  MCV 104.9*  --   PLT 225  --     Basic Metabolic Panel: Recent Labs  Lab 07/17/2021 2243  NA 148*  K 5.2*   GFR: CrCl cannot be calculated (Patient's most recent lab result is older than the maximum 21 days allowed.). Recent Labs  Lab 07/13/2021 2241  WBC 8.9    Liver Function Tests: No results for input(s): "AST", "ALT", "ALKPHOS", "BILITOT", "PROT", "ALBUMIN" in the  last 168 hours. No results for input(s): "LIPASE", "AMYLASE" in the last 168 hours. No results for input(s): "AMMONIA" in the last 168 hours.  ABG    Component Value Date/Time   PHART 7.179 (LL) 07/01/2021 2243   PCO2ART 44.1 07/28/2021 2243   PO2ART 119 (H) 07/09/2021 2243   HCO3 15.9 (L) 07/04/2021 2243   TCO2 17 (L) 07/21/2021 2243   ACIDBASEDEF 11.0 (H) 07/01/2021 2243   O2SAT 96 07/18/2021 2243     Coagulation Profile: No results for input(s): "INR", "PROTIME" in the last 168 hours.  Cardiac Enzymes: No results for input(s): "CKTOTAL", "CKMB", "CKMBINDEX", "TROPONINI" in the last 168 hours.  HbA1C: Hemoglobin A1C  Date/Time Value Ref Range Status  05/29/2017 12:00 AM 6.1  Final  01/25/2017 12:00 AM 6.1  Final   Hgb A1c MFr Bld  Date/Time Value Ref Range Status  12/12/2020 04:06 AM 6.0 (H) 4.8 - 5.6 %  Final    Comment:    (NOTE)         Prediabetes: 5.7 - 6.4         Diabetes: >6.4         Glycemic control for adults with diabetes: <7.0   04/05/2020 10:12 AM 5.8 (H) 4.8 - 5.6 % Final    Comment:    (NOTE) Pre diabetes:          5.7%-6.4%  Diabetes:              >6.4%  Glycemic control for   <7.0% adults with diabetes     CBG: No results for input(s): "GLUCAP" in the last 168 hours.  Review of Systems:   Unable to be obtained due to mental status.  Past Medical History:  She,  has a past medical history of Acute pulmonary embolism ([place]) (02/19/2015), Acute respiratory failure ([place]), Diabetes mellitus without complication ([place]), DVT (deep venous thrombosis) ([place]) (04/05/2015), Dysphagia, Epilepsy ([place]), GERD (gastroesophageal reflux disease), Hyperlipidemia, Hypertension, IBS (irritable bowel syndrome), Nontraumatic subarachnoid hemorrhage ([place]), SAH (subarachnoid hemorrhage) ([place]), and Urinary retention.   Surgical History:   Past Surgical History:  Procedure Laterality Date   ABDOMINAL SURGERY     ANEURYSM COILING     COLONOSCOPY N/A 02/20/2015   Procedure: COLONOSCOPY;  Surgeon: Gatha Mayer, MD;  Location: [facility];  Service: Endoscopy;  Laterality: N/A;   ESOPHAGOGASTRODUODENOSCOPY (EGD) WITH PROPOFOL N/A 02/02/2015   Procedure: ESOPHAGOGASTRODUODENOSCOPY (EGD) WITH PROPOFOL;  Surgeon: Judeth Horn, MD;  Location: [facility];  Service: General;  Laterality: N/A;   IR GASTROSTOMY TUBE MOD SED  04/13/2020   IR GENERIC HISTORICAL  08/26/2015   IR GASTRIC TUBE PERC CHG W/O IMG GUIDE 08/26/2015 Aletta Edouard, MD [facility]-INTERV RAD   IR GENERIC HISTORICAL  09/14/2015   IR [place] GASTRO/COLONIC TUBE PERCUT W/FLUORO 09/14/2015 Darrell K Allred, PA-C [facility]INTER[facility]   IR REPLACE G-TUBE SIMPLE WO FLUORO  06/08/2016   IR REPLACE G-TUBE SIMPLE WO FLUORO  10/24/2016   IR REPLACE G-TUBE SIMPLE WO FLUORO  01/07/2017   PEG PLACEMENT N/A 02/02/2015   Procedure: PERCUTANEOUS ENDOSCOPIC  GASTROSTOMY (PEG) PLACEMENT;  Surgeon: Judeth Horn, MD;  Location: [facility];  Service: General;  Laterality: N/A;   RADIOLOGY WITH ANESTHESIA N/A 01/13/2015   Procedure: RADIOLOGY WITH ANESTHESIA;  Surgeon: Consuella Lose, MD;  Location: [facility];  Service: Radiology;  Laterality: N/A;   TRACHEOSTOMY       Social History:   reports that she has quit smoking. She has never used smokeless tobacco. She reports that she  does not drink alcohol and does not use drugs.   Family History:  Her family history includes Hypertension in an other family member.   Allergies No Known Allergies   Home Medications  Prior to Admission medications   Medication Sig Start Date End Date Taking? Authorizing Provider  acetaminophen (TYLENOL) 325 MG tablet Take 650 mg by mouth every 6 (six) hours.    [provider]  apixaban (ELIQUIS) 5 MG TABS tablet Take 5 mg by mouth daily.    [provider]  bethanechol (URECHOLINE) 10 MG tablet Take 10 mg by mouth 3 (three) times daily. 09/08/17   [provider]  docusate sodium (COLACE) 100 MG capsule Take 100 mg by mouth daily.    [provider]  gabapentin (NEURONTIN) 100 MG capsule Take 200 mg by mouth 2 (two) times daily. 09/08/17   Almyra Free, MD  levETIRAcetam (KEPPRA) 100 MG/ML solution Take 7.5 mLs (750 mg total) by mouth 2 (two) times daily. 04/14/20   Oswald Hillock, MD  losartan (COZAAR) 25 MG tablet Take 1 tablet (25 mg total) by mouth daily. 04/14/20   Oswald Hillock, MD  melatonin 3 MG TABS tablet Take 6 mg by mouth at bedtime. 08/08/20   [provider]  metoprolol tartrate (LOPRESSOR) 25 mg/10 mL SUSP Take 20 mLs (50 mg total) by mouth 2 (two) times daily. 04/14/20   Oswald Hillock, MD  NON FORMULARY Apply 1 application topically 2 (two) times daily. Hand palm protector    [provider]  Nutritional Supplements (FEEDING SUPPLEMENT, JEVITY 1.2 CAL,) LIQD Place 1,000 mLs into feeding tube continuous.  54ml per hour per G tube continuously- start infusion daily at 0700 and stop at 2300    [provider]  Nutritional Supplements (NUTRITIONAL SUPPLEMENT PO) Take 237 mLs by mouth 2 (two) times daily. Ensure    [provider]  polyethylene glycol (MIRALAX / GLYCOLAX) packet Take 17 g by mouth daily.    [provider]  Probiotic Product (PROBIOTIC DAILY PO) Take 1 capsule by mouth 2 (two) times daily. 09/08/17   [provider]  rosuvastatin (CRESTOR) 5 MG tablet Take 5 mg by mouth daily. 09/24/17   [provider]  sertraline (ZOLOFT) 50 MG tablet Take 50 mg by mouth daily.    [provider]  traZODone (DESYREL) 100 MG tablet Take 100 mg by mouth at bedtime.    [provider]  urea (CARMOL) 40 % CREA Apply 1 application topically daily. To both feet    [provider]     Critical care time: 60 min.       Julian Hy, DO 07/22/2021 11:12 PM [facility]

## 2021-07-07 NOTE — ED Notes (Addendum)
Received verbal order from [place], NP that it is ok to use central line

## 2021-07-07 NOTE — Procedures (Signed)
Central Venous Catheter Insertion Procedure Note  Rachel Vang  [id number]  07-23-1965  Date:07-12-21  Time:11:36 PM   Provider Performing:Coralynn Gaona Kathie Rhodes Greggory Stallion   Procedure: Insertion of Non-tunneled Central Venous [phone]) with US guidance (63846)   Indication(s) Medication administration  Consent Risks of the procedure as well as the alternatives and risks of each were explained to the patient and/or caregiver.  Consent for the procedure was obtained and is signed in the bedside chart  Anesthesia Topical only with 1% lidocaine   Timeout Verified patient identification, verified procedure, site/side was marked, verified correct patient position, special equipment/implants available, medications/allergies/relevant history reviewed, required imaging and test results available.  Sterile Technique Maximal sterile technique including full sterile barrier drape, hand hygiene, sterile gown, sterile gloves, mask, hair covering, sterile ultrasound probe cover (if used).  Procedure Description Area of catheter insertion was cleaned with chlorhexidine and draped in sterile fashion.  With real-time ultrasound guidance a central venous catheter was placed into the left internal jugular vein. Nonpulsatile blood flow and easy flushing noted in all ports.  The catheter was sutured in place and sterile dressing applied.     Complications/Tolerance None; patient tolerated the procedure well. Chest X-ray is ordered to verify placement for internal jugular or subclavian cannulation.   Chest x-ray is not ordered for femoral cannulation.  EBL Minimal  Specimen(s) None  Gershon Mussel., MSN, APRN, AGACNP-BC [facility]  2021-07-12 , 11:37 PM  Please see [email or web address] for pager details  If no response, please call [phone] After hours, please call Elink at [phone]

## 2021-07-07 NOTE — ED Notes (Signed)
Will take the patient to CT and then will take the patient to 45M

## 2021-07-07 NOTE — Progress Notes (Signed)
Patient was having C-Line placed at time of arrival. Patient is then headed to CT then upstairs to 28M. I will place EEG at a later time in the shift when patient is available.

## 2021-07-07 NOTE — ED Notes (Signed)
X-ray at bedside

## 2021-07-08 ENCOUNTER — Inpatient Hospital Stay (HOSPITAL_COMMUNITY): Payer: Medicaid Other

## 2021-07-08 ENCOUNTER — Inpatient Hospital Stay (HOSPITAL_COMMUNITY): Payer: Medicaid Other | Admitting: General Practice

## 2021-07-08 ENCOUNTER — Encounter (HOSPITAL_COMMUNITY): Admission: EM | Disposition: E | Payer: Self-pay | Source: Home / Self Care | Attending: Internal Medicine

## 2021-07-08 ENCOUNTER — Other Ambulatory Visit: Payer: Self-pay

## 2021-07-08 ENCOUNTER — Other Ambulatory Visit (HOSPITAL_COMMUNITY): Payer: Medicaid Other

## 2021-07-08 DIAGNOSIS — J80 Acute respiratory distress syndrome: Secondary | ICD-10-CM | POA: Diagnosis not present

## 2021-07-08 DIAGNOSIS — N289 Disorder of kidney and ureter, unspecified: Secondary | ICD-10-CM

## 2021-07-08 DIAGNOSIS — E119 Type 2 diabetes mellitus without complications: Secondary | ICD-10-CM | POA: Diagnosis not present

## 2021-07-08 DIAGNOSIS — I10 Essential (primary) hypertension: Secondary | ICD-10-CM | POA: Diagnosis not present

## 2021-07-08 DIAGNOSIS — R4182 Altered mental status, unspecified: Secondary | ICD-10-CM

## 2021-07-08 DIAGNOSIS — R6521 Severe sepsis with septic shock: Secondary | ICD-10-CM | POA: Diagnosis not present

## 2021-07-08 DIAGNOSIS — J9601 Acute respiratory failure with hypoxia: Secondary | ICD-10-CM

## 2021-07-08 DIAGNOSIS — K56609 Unspecified intestinal obstruction, unspecified as to partial versus complete obstruction: Secondary | ICD-10-CM

## 2021-07-08 DIAGNOSIS — O85 Puerperal sepsis: Secondary | ICD-10-CM

## 2021-07-08 DIAGNOSIS — A419 Sepsis, unspecified organism: Secondary | ICD-10-CM | POA: Diagnosis not present

## 2021-07-08 DIAGNOSIS — Z87891 Personal history of nicotine dependence: Secondary | ICD-10-CM

## 2021-07-08 DIAGNOSIS — K559 Vascular disorder of intestine, unspecified: Secondary | ICD-10-CM

## 2021-07-08 HISTORY — PX: APPLICATION OF WOUND VAC: SHX5189

## 2021-07-08 HISTORY — PX: LAPAROTOMY: SHX154

## 2021-07-08 HISTORY — PX: LYSIS OF ADHESION: SHX5961

## 2021-07-08 LAB — POCT I-STAT 7, (LYTES, BLD GAS, ICA,H+H)
Acid-Base Excess: 4 mmol/L — ABNORMAL HIGH (ref 0.0–2.0)
Acid-base deficit: 1 mmol/L (ref 0.0–2.0)
Acid-base deficit: 1 mmol/L (ref 0.0–2.0)
Acid-base deficit: 3 mmol/L — ABNORMAL HIGH (ref 0.0–2.0)
Acid-base deficit: 3 mmol/L — ABNORMAL HIGH (ref 0.0–2.0)
Acid-base deficit: 8 mmol/L — ABNORMAL HIGH (ref 0.0–2.0)
Acid-base deficit: 9 mmol/L — ABNORMAL HIGH (ref 0.0–2.0)
Bicarbonate: 17.6 mmol/L — ABNORMAL LOW (ref 20.0–28.0)
Bicarbonate: 19.5 mmol/L — ABNORMAL LOW (ref 20.0–28.0)
Bicarbonate: 22 mmol/L (ref 20.0–28.0)
Bicarbonate: 23.3 mmol/L (ref 20.0–28.0)
Bicarbonate: 24.1 mmol/L (ref 20.0–28.0)
Bicarbonate: 24.9 mmol/L (ref 20.0–28.0)
Bicarbonate: 30.4 mmol/L — ABNORMAL HIGH (ref 20.0–28.0)
Calcium, Ion: 1.11 mmol/L — ABNORMAL LOW (ref 1.15–1.40)
Calcium, Ion: 1.12 mmol/L — ABNORMAL LOW (ref 1.15–1.40)
Calcium, Ion: 1.13 mmol/L — ABNORMAL LOW (ref 1.15–1.40)
Calcium, Ion: 1.13 mmol/L — ABNORMAL LOW (ref 1.15–1.40)
Calcium, Ion: 1.14 mmol/L — ABNORMAL LOW (ref 1.15–1.40)
Calcium, Ion: 1.14 mmol/L — ABNORMAL LOW (ref 1.15–1.40)
Calcium, Ion: 1.24 mmol/L (ref 1.15–1.40)
HCT: 24 % — ABNORMAL LOW (ref 36.0–46.0)
HCT: 26 % — ABNORMAL LOW (ref 36.0–46.0)
HCT: 27 % — ABNORMAL LOW (ref 36.0–46.0)
HCT: 28 % — ABNORMAL LOW (ref 36.0–46.0)
HCT: 30 % — ABNORMAL LOW (ref 36.0–46.0)
HCT: 30 % — ABNORMAL LOW (ref 36.0–46.0)
HCT: 39 % (ref 36.0–46.0)
Hemoglobin: 10.2 g/dL — ABNORMAL LOW (ref 12.0–15.0)
Hemoglobin: 10.2 g/dL — ABNORMAL LOW (ref 12.0–15.0)
Hemoglobin: 13.3 g/dL (ref 12.0–15.0)
Hemoglobin: 8.2 g/dL — ABNORMAL LOW (ref 12.0–15.0)
Hemoglobin: 8.8 g/dL — ABNORMAL LOW (ref 12.0–15.0)
Hemoglobin: 9.2 g/dL — ABNORMAL LOW (ref 12.0–15.0)
Hemoglobin: 9.5 g/dL — ABNORMAL LOW (ref 12.0–15.0)
O2 Saturation: 76 %
O2 Saturation: 82 %
O2 Saturation: 83 %
O2 Saturation: 86 %
O2 Saturation: 87 %
O2 Saturation: 88 %
O2 Saturation: 89 %
Patient temperature: 38.1
Patient temperature: 38.2
Patient temperature: 39.3
Patient temperature: 98.6
Patient temperature: 98.6
Patient temperature: 98.6
Potassium: 3.4 mmol/L — ABNORMAL LOW (ref 3.5–5.1)
Potassium: 3.6 mmol/L (ref 3.5–5.1)
Potassium: 3.7 mmol/L (ref 3.5–5.1)
Potassium: 3.9 mmol/L (ref 3.5–5.1)
Potassium: 4 mmol/L (ref 3.5–5.1)
Potassium: 4.5 mmol/L (ref 3.5–5.1)
Potassium: 4.5 mmol/L (ref 3.5–5.1)
Sodium: 144 mmol/L (ref 135–145)
Sodium: 146 mmol/L — ABNORMAL HIGH (ref 135–145)
Sodium: 148 mmol/L — ABNORMAL HIGH (ref 135–145)
Sodium: 151 mmol/L — ABNORMAL HIGH (ref 135–145)
Sodium: 154 mmol/L — ABNORMAL HIGH (ref 135–145)
Sodium: 158 mmol/L — ABNORMAL HIGH (ref 135–145)
Sodium: 159 mmol/L — ABNORMAL HIGH (ref 135–145)
TCO2: 19 mmol/L — ABNORMAL LOW (ref 22–32)
TCO2: 21 mmol/L — ABNORMAL LOW (ref 22–32)
TCO2: 23 mmol/L (ref 22–32)
TCO2: 25 mmol/L (ref 22–32)
TCO2: 25 mmol/L (ref 22–32)
TCO2: 26 mmol/L (ref 22–32)
TCO2: 32 mmol/L (ref 22–32)
pCO2 arterial: 36.7 mmHg (ref 32–48)
pCO2 arterial: 39.7 mmHg (ref 32–48)
pCO2 arterial: 47.5 mmHg (ref 32–48)
pCO2 arterial: 47.7 mmHg (ref 32–48)
pCO2 arterial: 47.9 mmHg (ref 32–48)
pCO2 arterial: 54.6 mmHg — ABNORMAL HIGH (ref 32–48)
pCO2 arterial: 56.3 mmHg — ABNORMAL HIGH (ref 32–48)
pH, Arterial: 7.148 — CL (ref 7.35–7.45)
pH, Arterial: 7.29 — ABNORMAL LOW (ref 7.35–7.45)
pH, Arterial: 7.301 — ABNORMAL LOW (ref 7.35–7.45)
pH, Arterial: 7.324 — ABNORMAL LOW (ref 7.35–7.45)
pH, Arterial: 7.326 — ABNORMAL LOW (ref 7.35–7.45)
pH, Arterial: 7.354 (ref 7.35–7.45)
pH, Arterial: 7.356 (ref 7.35–7.45)
pO2, Arterial: 43 mmHg — ABNORMAL LOW (ref 83–108)
pO2, Arterial: 50 mmHg — ABNORMAL LOW (ref 83–108)
pO2, Arterial: 58 mmHg — ABNORMAL LOW (ref 83–108)
pO2, Arterial: 59 mmHg — ABNORMAL LOW (ref 83–108)
pO2, Arterial: 60 mmHg — ABNORMAL LOW (ref 83–108)
pO2, Arterial: 60 mmHg — ABNORMAL LOW (ref 83–108)
pO2, Arterial: 73 mmHg — ABNORMAL LOW (ref 83–108)

## 2021-07-08 LAB — BLOOD CULTURE ID PANEL (REFLEXED) - BCID2

## 2021-07-08 LAB — POCT I-STAT, CHEM 8
BUN: 74 mg/dL — ABNORMAL HIGH (ref 6–20)
Calcium, Ion: 1.13 mmol/L — ABNORMAL LOW (ref 1.15–1.40)
Chloride: 111 mmol/L (ref 98–111)
Creatinine, Ser: 3.2 mg/dL — ABNORMAL HIGH (ref 0.44–1.00)
Glucose, Bld: 101 mg/dL — ABNORMAL HIGH (ref 70–99)
HCT: 29 % — ABNORMAL LOW (ref 36.0–46.0)
Hemoglobin: 9.9 g/dL — ABNORMAL LOW (ref 12.0–15.0)
Potassium: 4.3 mmol/L (ref 3.5–5.1)
Sodium: 146 mmol/L — ABNORMAL HIGH (ref 135–145)
TCO2: 21 mmol/L — ABNORMAL LOW (ref 22–32)

## 2021-07-08 LAB — URINALYSIS, ROUTINE W REFLEX MICROSCOPIC
Bacteria, UA: NONE SEEN
Bilirubin Urine: NEGATIVE
Glucose, UA: NEGATIVE mg/dL
Ketones, ur: NEGATIVE mg/dL
Nitrite: NEGATIVE
Protein, ur: 100 mg/dL — AB
RBC / HPF: >50 RBC/hpf — ABNORMAL HIGH (ref 0–5)
Specific Gravity, Urine: 1.016 (ref 1.005–1.030)
WBC, UA: >50 WBC/hpf — ABNORMAL HIGH (ref 0–5)
pH: 7 (ref 5.0–8.0)

## 2021-07-08 LAB — COMPREHENSIVE METABOLIC PANEL
ALT: 37 U/L (ref 0–44)
AST: 43 U/L — ABNORMAL HIGH (ref 15–41)
Albumin: 2.6 g/dL — ABNORMAL LOW (ref 3.5–5.0)
Alkaline Phosphatase: 61 U/L (ref 38–126)
Anion gap: 22 — ABNORMAL HIGH (ref 5–15)
BUN: 82 mg/dL — ABNORMAL HIGH (ref 6–20)
CO2: 13 mmol/L — ABNORMAL LOW (ref 22–32)
Calcium: 9 mg/dL (ref 8.9–10.3)
Chloride: 117 mmol/L — ABNORMAL HIGH (ref 98–111)
Creatinine, Ser: 5.44 mg/dL — ABNORMAL HIGH (ref 0.44–1.00)
GFR, Estimated: 9 mL/min — ABNORMAL LOW (ref >60)
Glucose, Bld: 213 mg/dL — ABNORMAL HIGH (ref 70–99)
Potassium: 5.8 mmol/L — ABNORMAL HIGH (ref 3.5–5.1)
Sodium: 152 mmol/L — ABNORMAL HIGH (ref 135–145)
Total Bilirubin: 0.6 mg/dL (ref 0.3–1.2)
Total Protein: 6.7 g/dL (ref 6.5–8.1)

## 2021-07-08 LAB — BASIC METABOLIC PANEL
Anion gap: 12 (ref 5–15)
Anion gap: 13 (ref 5–15)
BUN: 68 mg/dL — ABNORMAL HIGH (ref 6–20)
BUN: 67 mg/dL — ABNORMAL HIGH (ref 6–20)
CO2: 21 mmol/L — ABNORMAL LOW (ref 22–32)
CO2: 22 mmol/L (ref 22–32)
Calcium: 8 mg/dL — ABNORMAL LOW (ref 8.9–10.3)
Calcium: 8.3 mg/dL — ABNORMAL LOW (ref 8.9–10.3)
Chloride: 111 mmol/L (ref 98–111)
Chloride: 111 mmol/L (ref 98–111)
Creatinine, Ser: 3.99 mg/dL — ABNORMAL HIGH (ref 0.44–1.00)
Creatinine, Ser: 3.83 mg/dL — ABNORMAL HIGH (ref 0.44–1.00)
GFR, Estimated: 12 mL/min — ABNORMAL LOW (ref >60)
GFR, Estimated: 13 mL/min — ABNORMAL LOW (ref >60)
Glucose, Bld: 184 mg/dL — ABNORMAL HIGH (ref 70–99)
Glucose, Bld: 144 mg/dL — ABNORMAL HIGH (ref 70–99)
Potassium: 4.2 mmol/L (ref 3.5–5.1)
Potassium: 4.1 mmol/L (ref 3.5–5.1)
Sodium: 144 mmol/L (ref 135–145)
Sodium: 146 mmol/L — ABNORMAL HIGH (ref 135–145)

## 2021-07-08 LAB — GLUCOSE, CAPILLARY
Glucose-Capillary: 228 mg/dL — ABNORMAL HIGH (ref 70–99)
Glucose-Capillary: 150 mg/dL — ABNORMAL HIGH (ref 70–99)
Glucose-Capillary: 134 mg/dL — ABNORMAL HIGH (ref 70–99)
Glucose-Capillary: 159 mg/dL — ABNORMAL HIGH (ref 70–99)
Glucose-Capillary: 60 mg/dL — ABNORMAL LOW (ref 70–99)
Glucose-Capillary: 78 mg/dL (ref 70–99)
Glucose-Capillary: 148 mg/dL — ABNORMAL HIGH (ref 70–99)
Glucose-Capillary: 156 mg/dL — ABNORMAL HIGH (ref 70–99)
Glucose-Capillary: 153 mg/dL — ABNORMAL HIGH (ref 70–99)

## 2021-07-08 LAB — HIV ANTIBODY (ROUTINE TESTING W REFLEX): HIV Screen 4th Generation wRfx: NONREACTIVE

## 2021-07-08 LAB — PHOSPHORUS: Phosphorus: 3.8 mg/dL (ref 2.5–4.6)

## 2021-07-08 LAB — CBC
HCT: 29.8 % — ABNORMAL LOW (ref 36.0–46.0)
HCT: 27.4 % — ABNORMAL LOW (ref 36.0–46.0)
Hemoglobin: 9.6 g/dL — ABNORMAL LOW (ref 12.0–15.0)
Hemoglobin: 8.7 g/dL — ABNORMAL LOW (ref 12.0–15.0)
MCH: 31 pg (ref 26.0–34.0)
MCH: 30.4 pg (ref 26.0–34.0)
MCHC: 32.2 g/dL (ref 30.0–36.0)
MCHC: 31.8 g/dL (ref 30.0–36.0)
MCV: 96.1 fL (ref 80.0–100.0)
MCV: 95.8 fL (ref 80.0–100.0)
Platelets: 136 10*3/uL — ABNORMAL LOW (ref 150–400)
Platelets: 125 10*3/uL — ABNORMAL LOW (ref 150–400)
RBC: 3.1 MIL/uL — ABNORMAL LOW (ref 3.87–5.11)
RBC: 2.86 MIL/uL — ABNORMAL LOW (ref 3.87–5.11)
RDW: 16.1 % — ABNORMAL HIGH (ref 11.5–15.5)
RDW: 16.1 % — ABNORMAL HIGH (ref 11.5–15.5)
WBC: 1 10*3/uL — CL (ref 4.0–10.5)
WBC: 1.7 10*3/uL — ABNORMAL LOW (ref 4.0–10.5)
nRBC: 0 % (ref 0.0–0.2)
nRBC: 0 % (ref 0.0–0.2)

## 2021-07-08 LAB — APTT: aPTT: 98 seconds — ABNORMAL HIGH (ref 24–36)

## 2021-07-08 LAB — HEPARIN LEVEL (UNFRACTIONATED): Heparin Unfractionated: >1.1 IU/mL — ABNORMAL HIGH (ref 0.30–0.70)

## 2021-07-08 LAB — LACTIC ACID, PLASMA
Lactic Acid, Venous: >9 mmol/L (ref 0.5–1.9)
Lactic Acid, Venous: 7 mmol/L (ref 0.5–1.9)
Lactic Acid, Venous: 6.9 mmol/L (ref 0.5–1.9)
Lactic Acid, Venous: >9 mmol/L (ref 0.5–1.9)

## 2021-07-08 LAB — PREPARE RBC (CROSSMATCH)

## 2021-07-08 LAB — MRSA NEXT GEN BY PCR, NASAL: MRSA by PCR Next Gen: DETECTED — AB

## 2021-07-08 LAB — MAGNESIUM: Magnesium: 1.6 mg/dL — ABNORMAL LOW (ref 1.7–2.4)

## 2021-07-08 SURGERY — LAPAROTOMY, EXPLORATORY
Anesthesia: General | Site: Abdomen

## 2021-07-08 MED ORDER — MUPIROCIN 2 % EX OINT
1.0000 "application " | TOPICAL_OINTMENT | Freq: Two times a day (BID) | CUTANEOUS | Status: DC
  Administered 2021-07-08 – 2021-07-10 (×6): 1 via NASAL
  Filled 2021-07-08: qty 22

## 2021-07-08 MED ORDER — VASOPRESSIN 20 UNITS/100 ML INFUSION FOR SHOCK
0.0000 [IU]/min | INTRAVENOUS | Status: DC
  Administered 2021-07-08 – 2021-07-10 (×8): 0.04 [IU]/min via INTRAVENOUS
  Filled 2021-07-08 (×7): qty 100

## 2021-07-08 MED ORDER — HEPARIN (PORCINE) 25000 UT/250ML-% IV SOLN
800.0000 [IU]/h | INTRAVENOUS | Status: DC
  Administered 2021-07-09: 850 [IU]/h via INTRAVENOUS
  Filled 2021-07-08: qty 250

## 2021-07-08 MED ORDER — CHLORHEXIDINE GLUCONATE 0.12% ORAL RINSE (MEDLINE KIT)
15.0000 mL | Freq: Two times a day (BID) | OROMUCOSAL | Status: DC
  Administered 2021-07-08 – 2021-07-10 (×5): 15 mL via OROMUCOSAL

## 2021-07-08 MED ORDER — FENTANYL CITRATE (PF) 250 MCG/5ML IJ SOLN
INTRAMUSCULAR | Status: AC
  Filled 2021-07-08: qty 5

## 2021-07-08 MED ORDER — MAGNESIUM SULFATE 4 GM/100ML IV SOLN
4.0000 g | Freq: Once | INTRAVENOUS | Status: AC
  Administered 2021-07-08: 4 g via INTRAVENOUS
  Filled 2021-07-08: qty 100

## 2021-07-08 MED ORDER — DEXTROSE 50 % IV SOLN
12.5000 g | INTRAVENOUS | Status: AC
  Administered 2021-07-08: 12.5 g via INTRAVENOUS

## 2021-07-08 MED ORDER — LACTATED RINGERS IV BOLUS
1000.0000 mL | Freq: Once | INTRAVENOUS | Status: AC
  Administered 2021-07-08: 1000 mL via INTRAVENOUS

## 2021-07-08 MED ORDER — ROSUVASTATIN CALCIUM 5 MG PO TABS
5.0000 mg | ORAL_TABLET | Freq: Every day | ORAL | Status: DC
  Filled 2021-07-08: qty 1

## 2021-07-08 MED ORDER — PHENYLEPHRINE 80 MCG/ML (10ML) SYRINGE FOR IV PUSH (FOR BLOOD PRESSURE SUPPORT)
PREFILLED_SYRINGE | INTRAVENOUS | Status: AC
  Filled 2021-07-08: qty 20

## 2021-07-08 MED ORDER — SODIUM CHLORIDE 0.9% FLUSH
10.0000 mL | Freq: Two times a day (BID) | INTRAVENOUS | Status: DC
  Administered 2021-07-08: 20 mL
  Administered 2021-07-09 – 2021-07-10 (×2): 10 mL

## 2021-07-08 MED ORDER — ALBUMIN HUMAN 5 % IV SOLN: INTRAVENOUS | Status: DC | PRN

## 2021-07-08 MED ORDER — DEXMEDETOMIDINE HCL IN NACL 400 MCG/100ML IV SOLN
INTRAVENOUS | Status: AC
  Filled 2021-07-08: qty 100

## 2021-07-08 MED ORDER — POLYETHYLENE GLYCOL 3350 17 G PO PACK: 17.0000 g | PACK | Freq: Every day | ORAL | Status: DC

## 2021-07-08 MED ORDER — SODIUM BICARBONATE 8.4 % IV SOLN
50.0000 meq | Freq: Once | INTRAVENOUS | Status: AC
  Administered 2021-07-08: 50 meq via INTRAVENOUS

## 2021-07-08 MED ORDER — SODIUM BICARBONATE 8.4 % IV SOLN
50.0000 meq | Freq: Once | INTRAVENOUS | Status: AC
  Administered 2021-07-08: 50 meq via INTRAVENOUS
  Filled 2021-07-08: qty 50

## 2021-07-08 MED ORDER — ALBUMIN HUMAN 5 % IV SOLN
25.0000 g | Freq: Once | INTRAVENOUS | Status: AC
  Administered 2021-07-08: 25 g via INTRAVENOUS
  Filled 2021-07-08: qty 500

## 2021-07-08 MED ORDER — ACETAMINOPHEN 160 MG/5ML PO SOLN
650.0000 mg | Freq: Four times a day (QID) | ORAL | Status: DC | PRN
Start: 2021-07-08 — End: 2021-07-11

## 2021-07-08 MED ORDER — ACETAMINOPHEN 160 MG/5ML PO SOLN
650.0000 mg | Freq: Four times a day (QID) | ORAL | Status: DC
Start: 2021-07-08 — End: 2021-07-08
  Administered 2021-07-08 (×2): 650 mg
  Filled 2021-07-08 (×2): qty 20.3

## 2021-07-08 MED ORDER — GABAPENTIN 250 MG/5ML PO SOLN
200.0000 mg | Freq: Two times a day (BID) | ORAL | Status: DC
Start: 2021-07-08 — End: 2021-07-08
  Administered 2021-07-08: 200 mg
  Filled 2021-07-08 (×2): qty 4

## 2021-07-08 MED ORDER — LEVETIRACETAM 100 MG/ML PO SOLN
750.0000 mg | Freq: Two times a day (BID) | ORAL | Status: DC
Start: 2021-07-08 — End: 2021-07-08
  Administered 2021-07-08: 750 mg
  Filled 2021-07-08: qty 10

## 2021-07-08 MED ORDER — BETHANECHOL CHLORIDE 10 MG PO TABS: 10.0000 mg | ORAL_TABLET | Freq: Three times a day (TID) | ORAL | Status: DC

## 2021-07-08 MED ORDER — MIDAZOLAM HCL 2 MG/2ML IJ SOLN
INTRAMUSCULAR | Status: AC
  Filled 2021-07-08: qty 2

## 2021-07-08 MED ORDER — PHENYLEPHRINE CONCENTRATED 100MG/250ML (0.4 MG/ML) INFUSION SIMPLE
0.0000 ug/min | INTRAVENOUS | Status: DC
  Administered 2021-07-08: 40 ug/min via INTRAVENOUS
  Administered 2021-07-09 (×2): 400 ug/min via INTRAVENOUS
  Administered 2021-07-09: 250 ug/min via INTRAVENOUS
  Administered 2021-07-09 – 2021-07-10 (×5): 400 ug/min via INTRAVENOUS
  Filled 2021-07-08 (×11): qty 250

## 2021-07-08 MED ORDER — DEXMEDETOMIDINE HCL IN NACL 400 MCG/100ML IV SOLN: 0.0000 ug/kg/h | INTRAVENOUS | Status: DC

## 2021-07-08 MED ORDER — 0.9 % SODIUM CHLORIDE (POUR BTL) OPTIME
TOPICAL | Status: DC | PRN
  Administered 2021-07-08: 2000 mL

## 2021-07-08 MED ORDER — VASOPRESSIN 20 UNIT/ML IV SOLN
INTRAVENOUS | Status: DC | PRN
  Administered 2021-07-08: 2 [IU] via INTRAVENOUS

## 2021-07-08 MED ORDER — PIPERACILLIN-TAZOBACTAM 3.375 G IVPB
3.3750 g | Freq: Three times a day (TID) | INTRAVENOUS | Status: DC
  Administered 2021-07-08 – 2021-07-10 (×6): 3.375 g via INTRAVENOUS
  Filled 2021-07-08 (×6): qty 50

## 2021-07-08 MED ORDER — FENTANYL 2500MCG IN NS 250ML (10MCG/ML) PREMIX INFUSION
0.0000 ug/h | INTRAVENOUS | Status: DC
  Administered 2021-07-09: 25 ug/h via INTRAVENOUS
  Administered 2021-07-10: 75 ug/h via INTRAVENOUS
  Filled 2021-07-08 (×2): qty 250

## 2021-07-08 MED ORDER — MIDAZOLAM-SODIUM CHLORIDE 100-0.9 MG/100ML-% IV SOLN
0.5000 mg/h | INTRAVENOUS | Status: AC
  Administered 2021-07-09: 1 mg/h via INTRAVENOUS
  Filled 2021-07-08: qty 100

## 2021-07-08 MED ORDER — SODIUM CHLORIDE 0.9 % IV SOLN
750.0000 mg | Freq: Two times a day (BID) | INTRAVENOUS | Status: DC
  Administered 2021-07-08 – 2021-07-10 (×5): 750 mg via INTRAVENOUS
  Filled 2021-07-08 (×6): qty 7.5

## 2021-07-08 MED ORDER — SODIUM BICARBONATE 8.4 % IV SOLN
INTRAVENOUS | Status: AC
  Filled 2021-07-08: qty 50

## 2021-07-08 MED ORDER — PIPERACILLIN-TAZOBACTAM 3.375 G IVPB
3.3750 g | Freq: Two times a day (BID) | INTRAVENOUS | Status: DC
  Administered 2021-07-08: 3.375 g via INTRAVENOUS
  Filled 2021-07-08: qty 50

## 2021-07-08 MED ORDER — FENTANYL CITRATE (PF) 100 MCG/2ML IJ SOLN: 50.0000 ug | INTRAMUSCULAR | Status: DC | PRN

## 2021-07-08 MED ORDER — FENTANYL CITRATE (PF) 100 MCG/2ML IJ SOLN: 12.5000 ug | INTRAMUSCULAR | Status: AC | PRN

## 2021-07-08 MED ORDER — DEXTROSE IN LACTATED RINGERS 5 % IV SOLN: INTRAVENOUS | Status: DC

## 2021-07-08 MED ORDER — SODIUM CHLORIDE 0.9 % IV SOLN
INTRAVENOUS | Status: DC | PRN
  Administered 2021-07-08: 5 ug/min via INTRAVENOUS

## 2021-07-08 MED ORDER — EPINEPHRINE HCL 5 MG/250ML IV SOLN IN NS
0.5000 ug/min | INTRAVENOUS | Status: DC
  Administered 2021-07-08: 2 ug/min via INTRAVENOUS
  Administered 2021-07-09: 20 ug/min via INTRAVENOUS
  Administered 2021-07-09: 10 ug/min via INTRAVENOUS
  Administered 2021-07-09 (×3): 20 ug/min via INTRAVENOUS
  Administered 2021-07-10: 16 ug/min via INTRAVENOUS
  Administered 2021-07-10: 18 ug/min via INTRAVENOUS
  Administered 2021-07-10: 15 ug/min via INTRAVENOUS
  Filled 2021-07-08 (×10): qty 250

## 2021-07-08 MED ORDER — PANTOPRAZOLE SODIUM 40 MG IV SOLR
40.0000 mg | Freq: Two times a day (BID) | INTRAVENOUS | Status: DC
  Administered 2021-07-08 – 2021-07-10 (×5): 40 mg via INTRAVENOUS
  Filled 2021-07-08 (×5): qty 10

## 2021-07-08 MED ORDER — NOREPINEPHRINE 16 MG/250ML-% IV SOLN
0.0000 ug/min | INTRAVENOUS | Status: DC
  Administered 2021-07-08: 80 ug/min via INTRAVENOUS
  Administered 2021-07-08: 42 ug/min via INTRAVENOUS
  Administered 2021-07-08: 80 ug/min via INTRAVENOUS
  Administered 2021-07-08: 40 ug/min via INTRAVENOUS
  Administered 2021-07-09: 80 ug/min via INTRAVENOUS
  Administered 2021-07-09: 70 ug/min via INTRAVENOUS
  Administered 2021-07-09: 80 ug/min via INTRAVENOUS
  Administered 2021-07-09: 70 ug/min via INTRAVENOUS
  Administered 2021-07-09 – 2021-07-10 (×8): 80 ug/min via INTRAVENOUS
  Filled 2021-07-08 (×14): qty 250

## 2021-07-08 MED ORDER — CALCIUM GLUCONATE-NACL 2-0.675 GM/100ML-% IV SOLN
2.0000 g | Freq: Once | INTRAVENOUS | Status: AC
  Administered 2021-07-08: 2000 mg via INTRAVENOUS
  Filled 2021-07-08: qty 100

## 2021-07-08 MED ORDER — SODIUM CHLORIDE 0.9% IV SOLUTION: Freq: Once | INTRAVENOUS | Status: DC

## 2021-07-08 MED ORDER — DOCUSATE SODIUM 50 MG/5ML PO LIQD: 100.0000 mg | Freq: Every day | ORAL | Status: DC

## 2021-07-08 MED ORDER — FENTANYL CITRATE (PF) 100 MCG/2ML IJ SOLN
50.0000 ug | INTRAMUSCULAR | Status: DC | PRN
  Administered 2021-07-08: 100 ug via INTRAVENOUS
  Administered 2021-07-08: 50 ug via INTRAVENOUS
  Filled 2021-07-08 (×2): qty 2

## 2021-07-08 MED ORDER — SODIUM BICARBONATE 8.4 % IV SOLN
INTRAVENOUS | Status: AC
  Filled 2021-07-08: qty 100

## 2021-07-08 MED ORDER — PHENYLEPHRINE HCL-NACL 20-0.9 MG/250ML-% IV SOLN
INTRAVENOUS | Status: AC
  Administered 2021-07-08: 40 mg
  Filled 2021-07-08: qty 250

## 2021-07-08 MED ORDER — SODIUM BICARBONATE 8.4 % IV SOLN
100.0000 meq | Freq: Once | INTRAVENOUS | Status: AC
Start: 2021-07-08 — End: 2021-07-08
  Administered 2021-07-08: 100 meq via INTRAVENOUS

## 2021-07-08 MED ORDER — CHLORHEXIDINE GLUCONATE CLOTH 2 % EX PADS
6.0000 | MEDICATED_PAD | Freq: Every day | CUTANEOUS | Status: DC
  Administered 2021-07-08 (×2): 6 via TOPICAL

## 2021-07-08 MED ORDER — SODIUM BICARBONATE 8.4 % IV SOLN
100.0000 meq | Freq: Once | INTRAVENOUS | Status: AC
  Administered 2021-07-08: 100 meq via INTRAVENOUS

## 2021-07-08 MED ORDER — HEPARIN (PORCINE) 25000 UT/250ML-% IV SOLN
900.0000 [IU]/h | INTRAVENOUS | Status: DC
  Administered 2021-07-08: 900 [IU]/h via INTRAVENOUS
  Filled 2021-07-08: qty 250

## 2021-07-08 MED ORDER — ROCURONIUM BROMIDE 10 MG/ML (PF) SYRINGE
PREFILLED_SYRINGE | INTRAVENOUS | Status: DC | PRN
  Administered 2021-07-08: 100 mg via INTRAVENOUS

## 2021-07-08 MED ORDER — ORAL CARE MOUTH RINSE
15.0000 mL | OROMUCOSAL | Status: DC
  Administered 2021-07-08 – 2021-07-10 (×22): 15 mL via OROMUCOSAL

## 2021-07-08 MED ORDER — SODIUM CHLORIDE 0.9 % IV BOLUS
500.0000 mL | Freq: Once | INTRAVENOUS | Status: AC
  Administered 2021-07-08: 500 mL via INTRAVENOUS

## 2021-07-08 MED ORDER — SODIUM CHLORIDE 0.9% FLUSH
10.0000 mL | INTRAVENOUS | Status: DC | PRN
  Administered 2021-07-09: 10 mL

## 2021-07-08 MED ORDER — DEXMEDETOMIDINE HCL IN NACL 400 MCG/100ML IV SOLN
0.4000 ug/kg/h | INTRAVENOUS | Status: DC
  Administered 2021-07-08 (×2): 0.4 ug/kg/h via INTRAVENOUS

## 2021-07-08 MED ORDER — MIDAZOLAM HCL 2 MG/2ML IJ SOLN
INTRAMUSCULAR | Status: DC | PRN
  Administered 2021-07-08: 1 mg via INTRAVENOUS

## 2021-07-08 MED ORDER — EPINEPHRINE 1 MG/10ML IJ SOSY
PREFILLED_SYRINGE | INTRAMUSCULAR | Status: DC | PRN
  Administered 2021-07-08 (×2): 100 ug via INTRAVENOUS

## 2021-07-08 MED ORDER — VANCOMYCIN VARIABLE DOSE PER UNSTABLE RENAL FUNCTION (PHARMACIST DOSING)
Status: DC
Start: 2021-07-08 — End: 2021-07-08

## 2021-07-08 MED ORDER — DEXTROSE 50 % IV SOLN
INTRAVENOUS | Status: AC
  Filled 2021-07-08: qty 50

## 2021-07-08 MED ORDER — SERTRALINE HCL 50 MG PO TABS: 25.0000 mg | ORAL_TABLET | Freq: Every day | ORAL | Status: DC

## 2021-07-08 MED ORDER — PROPOFOL 10 MG/ML IV BOLUS
INTRAVENOUS | Status: AC
  Filled 2021-07-08: qty 20

## 2021-07-08 MED ORDER — SODIUM BICARBONATE 8.4 % IV SOLN
INTRAVENOUS | Status: DC | PRN
  Administered 2021-07-08: 50 meq via INTRAVENOUS

## 2021-07-08 SURGICAL SUPPLY — 44 items
BAG COUNTER SPONGE SURGICOUNT (BAG) ×4 IMPLANT
BLADE CLIPPER SURG (BLADE) ×2 IMPLANT
CANISTER SUCT 3000ML PPV (MISCELLANEOUS) ×4 IMPLANT
CANISTER WOUNDNEG PRESSURE 500 (CANNISTER) ×2 IMPLANT
CHLORAPREP W/TINT 26 (MISCELLANEOUS) ×4 IMPLANT
COVER SURGICAL LIGHT HANDLE (MISCELLANEOUS) ×4 IMPLANT
DRAPE LAPAROSCOPIC ABDOMINAL (DRAPES) ×4 IMPLANT
DRAPE WARM FLUID 44X44 (DRAPES) ×4 IMPLANT
DRSG OPSITE POSTOP 4X10 (GAUZE/BANDAGES/DRESSINGS) IMPLANT
DRSG OPSITE POSTOP 4X8 (GAUZE/BANDAGES/DRESSINGS) IMPLANT
ELECT BLADE 6.5 EXT (BLADE) IMPLANT
ELECT CAUTERY BLADE 6.4 (BLADE) ×4 IMPLANT
ELECT REM PT RETURN 9FT ADLT (ELECTROSURGICAL) ×4
ELECTRODE REM PT RTRN 9FT ADLT (ELECTROSURGICAL) ×3 IMPLANT
GAUZE SPONGE 4X4 12PLY STRL (GAUZE/BANDAGES/DRESSINGS) ×4 IMPLANT
GLOVE BIOGEL M STRL SZ7.5 (GLOVE) ×8 IMPLANT
GLOVE INDICATOR 8.0 STRL GRN (GLOVE) ×8 IMPLANT
GOWN STRL REUS W/ TWL LRG LVL3 (GOWN DISPOSABLE) ×6 IMPLANT
GOWN STRL REUS W/TWL 2XL LVL3 (GOWN DISPOSABLE) ×4 IMPLANT
GOWN STRL REUS W/TWL LRG LVL3 (GOWN DISPOSABLE) ×4
HANDLE SUCTION POOLE (INSTRUMENTS) ×3 IMPLANT
KIT BASIN OR (CUSTOM PROCEDURE TRAY) ×4 IMPLANT
KIT TURNOVER KIT B (KITS) ×4 IMPLANT
LIGASURE IMPACT 36 18CM CVD LR (INSTRUMENTS) ×4 IMPLANT
NS IRRIG 1000ML POUR BTL (IV SOLUTION) ×8 IMPLANT
PACK GENERAL/GYN (CUSTOM PROCEDURE TRAY) ×4 IMPLANT
PAD ARMBOARD 7.5X6 YLW CONV (MISCELLANEOUS) ×4 IMPLANT
PENCIL SMOKE EVACUATOR (MISCELLANEOUS) ×2 IMPLANT
SPECIMEN JAR LARGE (MISCELLANEOUS) ×2 IMPLANT
SPONGE ABD ABTHERA ADVANCE (MISCELLANEOUS) ×2 IMPLANT
SPONGE T-LAP 18X18 ~~LOC~~+RFID (SPONGE) IMPLANT
STAPLER VISISTAT 35W (STAPLE) ×2 IMPLANT
SUCTION POOLE HANDLE (INSTRUMENTS) ×4
SUT PDS AB 1 TP1 96 (SUTURE) ×8 IMPLANT
SUT SILK 2 0 SH CR/8 (SUTURE) ×4 IMPLANT
SUT SILK 2 0 TIES 10X30 (SUTURE) ×4 IMPLANT
SUT SILK 3 0 SH CR/8 (SUTURE) ×4 IMPLANT
SUT SILK 3 0 TIES 10X30 (SUTURE) ×4 IMPLANT
SUT VIC AB 3-0 SH 18 (SUTURE) IMPLANT
TOWEL GREEN STERILE (TOWEL DISPOSABLE) ×4 IMPLANT
TOWEL GREEN STERILE FF (TOWEL DISPOSABLE) ×4 IMPLANT
TRAY FOLEY MTR SLVR 14FR STAT (SET/KITS/TRAYS/PACK) ×4 IMPLANT
TRAY FOLEY MTR SLVR 16FR STAT (SET/KITS/TRAYS/PACK) IMPLANT
YANKAUER SUCT BULB TIP NO VENT (SUCTIONS) ×4 IMPLANT

## 2021-07-08 NOTE — Transfer of Care (Signed)
Immediate Anesthesia Transfer of Care Note  Patient: Rachel Vang  Procedure(s) Performed: EXPLORATORY LAPAROTOMY (Abdomen) LYSIS OF ADHESION (Abdomen) APPLICATION OF ABTHERA WOUND VAC (Abdomen)  Patient Location: ICU  Anesthesia Type:General  Level of Consciousness: Patient remains intubated per anesthesia plan  Airway & Oxygen Therapy: Patient remains intubated per anesthesia plan and Patient placed on Ventilator (see vital sign flow sheet for setting)  Post-op Assessment: Report given to RN and Post -op Vital signs reviewed and stable  Post vital signs: Reviewed and stable  Last Vitals:  Vitals Value Taken Time  BP 127/72 07/25/2021 16:24  Temp 38 07/04/2021 16:24  Pulse 155 07/15/2021 16:24  Resp 20 07/05/2021 16:24  SpO2      Last Pain:  Vitals:   07/24/2021 0800  TempSrc: Bladder         Complications: No notable events documented.

## 2021-07-08 NOTE — Progress Notes (Signed)
Patient transported to CT and to 3M13 via the ventilator with no complications.

## 2021-07-08 NOTE — Progress Notes (Signed)
PHARMACY - PHYSICIAN COMMUNICATION CRITICAL VALUE ALERT - BLOOD CULTURE IDENTIFICATION (BCID)  Rachel Vang is an 56 y.o. female who presented to [facility] on 07/01/2021 after being found unresponsive at nursing home.  Currently being treated for aspiration and sepsis.  Assessment: Growing Proteus in 2 of 5 blood culture bottles so far, no resistance detected.  She is s/p ex-lap with LOA and her abdomen remains open.  Patient is critically ill and on multiple pressors.  Name of physician (or Provider) Contacted: Dr. Tacy Learn  Current antibiotics: vancomycin and Zosyn  Changes to prescribed antibiotics recommended:  Recommendations accepted by provider - D/C vanc and continue broad spectrum Zosyn for now.  Narrow as appropriate.  Results for orders placed or performed during the hospital encounter of 07/06/2021  Blood Culture ID Panel (Reflexed) (Collected: 07/20/2021 10:18 AM)  Result Value Ref Range   Enterococcus faecalis NOT DETECTED NOT DETECTED   Enterococcus Faecium NOT DETECTED NOT DETECTED   Listeria monocytogenes NOT DETECTED NOT DETECTED   Staphylococcus species NOT DETECTED NOT DETECTED   Staphylococcus aureus (BCID) NOT DETECTED NOT DETECTED   Staphylococcus epidermidis NOT DETECTED NOT DETECTED   Staphylococcus lugdunensis NOT DETECTED NOT DETECTED   Streptococcus species NOT DETECTED NOT DETECTED   Streptococcus agalactiae NOT DETECTED NOT DETECTED   Streptococcus pneumoniae NOT DETECTED NOT DETECTED   Streptococcus pyogenes NOT DETECTED NOT DETECTED   A.calcoaceticus-baumannii NOT DETECTED NOT DETECTED   Bacteroides fragilis NOT DETECTED NOT DETECTED   Enterobacterales DETECTED (A) NOT DETECTED   Enterobacter cloacae complex NOT DETECTED NOT DETECTED   Escherichia coli NOT DETECTED NOT DETECTED   Klebsiella aerogenes NOT DETECTED NOT DETECTED   Klebsiella oxytoca NOT DETECTED NOT DETECTED   Klebsiella pneumoniae NOT DETECTED NOT DETECTED   Proteus species DETECTED  (A) NOT DETECTED   Salmonella species NOT DETECTED NOT DETECTED   Serratia marcescens NOT DETECTED NOT DETECTED   Haemophilus influenzae NOT DETECTED NOT DETECTED   Neisseria meningitidis NOT DETECTED NOT DETECTED   Pseudomonas aeruginosa NOT DETECTED NOT DETECTED   Stenotrophomonas maltophilia NOT DETECTED NOT DETECTED   Candida albicans NOT DETECTED NOT DETECTED   Candida auris NOT DETECTED NOT DETECTED   Candida glabrata NOT DETECTED NOT DETECTED   Candida krusei NOT DETECTED NOT DETECTED   Candida parapsilosis NOT DETECTED NOT DETECTED   Candida tropicalis NOT DETECTED NOT DETECTED   Cryptococcus neoformans/gattii NOT DETECTED NOT DETECTED   CTX-M ESBL NOT DETECTED NOT DETECTED   Carbapenem resistance IMP NOT DETECTED NOT DETECTED   Carbapenem resistance KPC NOT DETECTED NOT DETECTED   Carbapenem resistance NDM NOT DETECTED NOT DETECTED   Carbapenem resist OXA 48 LIKE NOT DETECTED NOT DETECTED   Carbapenem resistance VIM NOT DETECTED NOT DETECTED    Aime Meloche D. Mina Marble, PharmD, BCPS, [place] 07/22/2021, 5:47 PM

## 2021-07-08 NOTE — Anesthesia Postprocedure Evaluation (Signed)
Anesthesia Post Note  Patient: Rachel Vang  Procedure(s) Performed: EXPLORATORY LAPAROTOMY (Abdomen) LYSIS OF ADHESION (Abdomen) APPLICATION OF ABTHERA WOUND VAC (Abdomen)     Patient location during evaluation: SICU Anesthesia Type: General Level of consciousness: sedated Pain management: pain level controlled Vital Signs Assessment: post-procedure vital signs reviewed and stable Respiratory status: patient remains intubated per anesthesia plan Cardiovascular status: stable Postop Assessment: no apparent nausea or vomiting Anesthetic complications: no   No notable events documented.  Last Vitals:  Vitals:   07/14/2021 1415 07/11/2021 1430  BP:    Pulse:    Resp: (!) 30 (!) 45  Temp: (!) 39.3 C   SpO2:      Last Pain:  Vitals:   07/22/2021 0800  TempSrc: Bladder                 Belenda Cruise P Brandn Mcgath

## 2021-07-08 NOTE — Progress Notes (Addendum)
Ct reviewed- bilateral hydro but foley balloon likely obstructive urethra and bladder is distended. After removal of old foley, lots of old brown urine came out, new foley in and pink tinged sediment, but draining. Total UOP since change 350 measured dark red urine, but probably 150cc unmeasured. Will get US renal to ensure hydro has resolved.  Surgery consulted for likely SBO on CT.  LA >9, additional 1L LR ordered. Follow up LA level ordered.  Steffanie Dunn, DO 07/09/2021 1:18 AM [facility]

## 2021-07-08 NOTE — IPAL (Signed)
Interdisciplinary Goals of Care Family Meeting   Date carried out:: 07/13/2021  Location of the meeting: Bedside  Member's involved: Physician, Bedside Registered Nurse, and Family Member or next of kin  Durable Power of Attorney or acting medical decision maker: Okey Dupre    Discussion: We discussed goals of care for [employer] .    The Clinical status was relayed to patient's son at bedside in detail.   Updated and notified of patients medical condition.   Patient remains unresponsive and will not open eyes to command.   Patient is having a weak cough and struggling to remove secretions.   Patient with increased WOB and using accessory muscles to breathe   Patient with Progressive multiorgan failure with a very high probablity of a very minimal chance of meaningful recovery despite all aggressive and optimal medical therapy.  Code status: Full Code  Disposition: Continue current acute care    Family are satisfied with Plan of action and management. All questions answered   Cheri Fowler MD  Pulmonary Critical Care See Amion for pager If no response to pager, please call [phone] until 7pm After 7pm, Please call E-link [phone]

## 2021-07-08 NOTE — Anesthesia Preprocedure Evaluation (Addendum)
Anesthesia Evaluation  Patient identified by MRN, date of birth, ID band Patient unresponsive  Preop documentation limited or incomplete due to emergent nature of procedure.  Airway Mallampati: Intubated       Dental  (+) Poor Dentition, Missing   Pulmonary former smoker, PE Septic shock 2/2 aspiration PNA with ARDS    + decreased breath sounds  + intubated    Cardiovascular hypertension, Pt. on medications and Pt. on home beta blockers + DVT   Rhythm:Regular Rate:Tachycardia     Neuro/Psych Seizures -,  SAH CVA negative psych ROS   GI/Hepatic Neg liver ROS, GERD  ,SBO   Endo/Other  diabetes  Renal/GU Renal disease  negative genitourinary   Musculoskeletal   Abdominal Normal abdominal exam  (+)   Peds  Hematology negative hematology ROS (+)   Anesthesia Other Findings bedbound at baseline with PEG  Reproductive/Obstetrics                            Anesthesia Physical Anesthesia Plan  ASA: 5 and emergent  Anesthesia Plan: General   Post-op Pain Management:    Induction:   PONV Risk Score and Plan: 3 and Treatment may vary due to age or medical condition  Airway Management Planned: Oral ETT  Additional Equipment: Arterial line and CVP  Intra-op Plan:   Post-operative Plan: Post-operative intubation/ventilation  Informed Consent:     Only emergency history available  Plan Discussed with:   Anesthesia Plan Comments: (Lab Results      Component                Value               Date                      WBC                      1.0 (LL)            07/06/2021                HGB                      9.5 (L)             07/22/2021                HCT                      28.0 (L)            07/13/2021                MCV                      96.1                07/01/2021                PLT                      136 (L)             07/04/2021           Lab Results       Component                Value  Date                      NA                       146 (H)             07/17/2021                K                        4.5                 07/06/2021                CO2                      22                  07/06/2021                GLUCOSE                  144 (H)             07/18/2021                BUN                      67 (H)              07/03/2021                CREATININE               3.83 (H)            07/06/2021                CALCIUM                  8.3 (L)             07/06/2021                EGFR                     96                  06/28/2020                GFRNONAA                 13 (L)              07/17/2021          )       Anesthesia Quick Evaluation

## 2021-07-08 NOTE — Progress Notes (Signed)
eLink Physician-Brief Progress Note Patient Name: Rachel Vang DOB: [birth year] MRN: [id number]   Date of Service  07/17/21  HPI/Events of Note  Labs reviewed.  eICU Interventions          Migdalia Dk Jul 17, 2021, 5:57 AM

## 2021-07-08 NOTE — Progress Notes (Signed)
Sepsis tracking by eLINK 

## 2021-07-08 NOTE — Progress Notes (Signed)
eLink Physician-Brief Progress Note Patient Name: Rachel Vang DOB: 1965/06/06 MRN: [id number]   Date of Service  08-01-21  HPI/Events of Note  Patient admitted with altered mental status, acute respiratory failure requiring intubation, septic shock, and aspiration pneumonia, against a background of remote sub-arachnoid hemorrhage with chronic disability.  eICU Interventions  New Patient Evaluation. Follow up pending lab tests.        Thomasene Lot Qadir Folks 2021/08/01, 12:56 AM

## 2021-07-08 NOTE — H&P (Signed)
NAME:  Rachel Vang, MRN:  [id number], DOB:  19-Nov-1965, LOS: 1 ADMISSION DATE:  07/25/2021, CONSULTATION DATE: 07/03/2021 REFERRING MD:  Bethann Humble, CHIEF COMPLAINT: Acute respiratory failure  History of Present Illness:  Ms. Rachel Vang is a 56 year old woman with a history of SAH who is bedbound at baseline and mostly dependent on PEG tube who presents after being found unresponsive at [place] facility.  EMS was unable to intubate her despite severe hypoxia due to poor mouth opening.  She required cardioversion for tachycardia with heart rates in the 180s.  In the ED she was intubated after RSI medications were given, although she was a difficult airway.  She had dark brown thin liquid suctioned from her NG tube and from her airway.  She remains tachycardic, hypotensive, tachypneic.  She was started on peripheral norepinephrine for hypotension.  Her son indicated that at baseline she is bedbound, but is able to communicate, uses her tablet, and frequently watches TV.  She sometimes eats, but still has a PEG tube.  She has a history of previous tracheostomy.  Previously been on apixaban for history of pulmonary embolism.  She is a chronic Foley catheter since her most recent hospital admission several months ago.  She has a history of GU bleeding, LABA as recent as a month ago that had been attributed to her chronic anticoagulation.  Her son says this is resolved.  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 rate (!) 31, height 5\' 8"  (1.727 m), weight 67.2 kg, SpO2 (!) 80 %. CVP:  [10 mmHg] 10 mmHg  Vent Mode: PRVC FiO2 (%):  [100 %] 100 % Set Rate:  [18 bmp-30 bmp] 30 bmp Vt Set:  [500 mL-510 mL] 510 mL PEEP:  [5 cmH20-12 cmH20] 12 cmH20 Plateau Pressure:  [21 cmH20-25 cmH20] 24 cmH20   Intake/Output Summary (Last 24 hours) at 07/25/2021 1223 Last data filed at 07/22/2021 1032 Gross per 24 hour  Intake 8165.49 ml  Output 2515 ml  Net 5650.49 ml   Filed Weights   07/11/2021 0045 07/07/2021 0340  Weight: 67.2 kg 67.2 kg    Examination: General: Critically ill-appearing woman lying in bed intubated HENT: [place]/AT, eyes anicteric. Poor dentition, dental carries. Brown thin liquid in mouth. Lungs: Coarse rales bilaterally, minimal thin brown secretions via ETT.  Tachypneic, breathing over the vent.   Cardiovascular: Tachycardic in the 150s, regular rhythm Abdomen: Soft, distended.  PEG tube. Extremities: No peripheral edema, minimal muscle mass Neuro: Unresponsive, pupils reactive, no significant gag reflex.  Breathing over the vent. GU: Foley with blood-tinged pink urine  ABG    Component Value Date/Time   PHART 7.356 07/01/2021 0739   PCO2ART 39.7 07/07/2021 0739   PO2ART 58 (L) 07/20/2021 0739   HCO3 22.0 07/27/2021 0739   TCO2 23 07/07/2021 0739   ACIDBASEDEF 3.0 (H) 07/09/2021 0739   O2SAT 87 07/22/2021 0739    Resolved Hospital Problem list     Assessment & Plan:  Acute respiratory failure with  hypoxia due to aspiration pneumonia versus pneumonitis bilaterally Severe ARDS Previous tracheostomy, now decannulated Continue lung protective ventilation PAD protocol for sedation; fentanyl as needed only at this point VAP prevention protocol Follow-up respiratory culture Continue IV Zosyn and vancomycin MRSA swab is positive  Septic shock due to aspiration pneumonia versus pneumonitis Lactic  acidosis Patient is on high-dose vasopressors including norepinephrine and vasopressin Continue with map goal 65 Continue IV antibiotics Follow-up blood and respiratory culture Trend lactate  Probable ischemic bowel Small bowel obstruction CT abdomen is suggestive of bowel obstruction, lactate is elevated, patient has distended abdomen Appreciate general surgery follow-up, recommend repeating CT scan and possible bowel resection in OR today Lactate is trending up again  AKI, due to septic ATN Hypernatremia Continue IV fluid resuscitation Monitor intake and output Renal ultrasound showed mild hydronephrosis Foley catheter was replaced Avoid nephrotoxic agents  Acute septic/metabolic encephalopathy In the setting of sepsis, hypoxia and metabolic acidosis Avoid sedation CT head was negative for acute findings EEG showed diffuse encephalopathy  History of previous subarachnoid hemorrhage with residual right hemiparesis Bedbound at baseline.  Hypocalcemia Continue aggressive electrolyte supplement  Diabetes type II Continue SSI as needed Goal BG 140-180  History of DVT and PE Hold Eliquis Continue IV heparin infusion  Best Practice (right click and "Reselect all SmartList Selections" daily)   Diet/type: NPO DVT prophylaxis: systemic heparin GI prophylaxis: PPI Lines: Central line Foley:  Yes, and it is still needed Code Status:  full code Last date of multidisciplinary goals of care discussion [sons updated on 6/10, please see iPal note]  Labs   CBC: Recent Labs  Lab 07/04/2021 2241 07/12/2021 2243 07/22/2021 2305 07/17/2021 0150 07/22/2021 0455 07/15/2021 0536 07/17/2021 0739  WBC 8.9  --  6.3  --  1.0*  --   --   NEUTROABS 6.8  --   --   --   --   --   --   HGB 12.0   < > 12.4 9.2* 9.6* 8.2* 9.5*  HCT 41.0   < > 40.7 27.0* 29.8* 24.0* 28.0*  MCV 104.9*  --  102.5*  --  96.1  --   --   PLT 225  --  234  --  136*  --   --    < > = values in this interval not  displayed.    Basic Metabolic Panel: Recent Labs  Lab 07/28/2021 2241 07/09/2021 2243 07/05/2021 2305 07/18/2021 0150 07/24/2021 0228 07/02/2021 0411 07/14/2021 0536 07/14/2021 0739  NA 152*   < >  --  144 144 146* 148* 146*  K 5.8*   < >  --  4.5 4.2 4.1 3.4* 4.5  CL 117*  --   --   --  111 111  --   --   CO2 13*  --   --   --  21* 22  --   --   GLUCOSE 213*  --   --   --  184* 144*  --   --   BUN 82*  --   --   --  68* 67*  --   --   CREATININE 5.44*  --  5.30*  --  3.99* 3.83*  --   --   CALCIUM 9.0  --   --   --  8.0* 8.3*  --   --   MG  --   --   --   --   --  1.6*  --   --  PHOS  --   --   --   --   --  3.8  --   --    < > = values in this interval not displayed.   GFR: Estimated Creatinine Clearance: 16.2 mL/min (A) (by C-G formula based on SCr of 3.83 mg/dL (H)). Recent Labs  Lab 07/20/2021 2241 07/09/2021 2305 07/25/2021 0122 07/04/2021 0352 07/03/2021 0455 07/24/2021 0921  WBC 8.9 6.3  --   --  1.0*  --   LATICACIDVEN  --  >9.0* 7.0* 6.9*  --  >9.0*    Liver Function Tests: Recent Labs  Lab 07/16/2021 2241  AST 43*  ALT 37  ALKPHOS 61  BILITOT 0.6  PROT 6.7  ALBUMIN 2.6*   No results for input(s): "LIPASE", "AMYLASE" in the last 168 hours. No results for input(s): "AMMONIA" in the last 168 hours.  ABG    Component Value Date/Time   PHART 7.356 07/16/2021 0739   PCO2ART 39.7 07/26/2021 0739   PO2ART 58 (L) 07/26/2021 0739   HCO3 22.0 07/02/2021 0739   TCO2 23 07/25/2021 0739   ACIDBASEDEF 3.0 (H) 07/12/2021 0739   O2SAT 87 07/05/2021 0739     Coagulation Profile: Recent Labs  Lab 07/13/2021 2305  INR 2.6*    Cardiac Enzymes: No results for input(s): "CKTOTAL", "CKMB", "CKMBINDEX", "TROPONINI" in the last 168 hours.  HbA1C: Hemoglobin A1C  Date/Time Value Ref Range Status  05/29/2017 12:00 AM 6.1  Final  01/25/2017 12:00 AM 6.1  Final   Hgb A1c MFr Bld  Date/Time Value Ref Range Status  12/12/2020 04:06 AM 6.0 (H) 4.8 - 5.6 % Final    Comment:     (NOTE)         Prediabetes: 5.7 - 6.4         Diabetes: >6.4         Glycemic control for adults with diabetes: <7.0   04/05/2020 10:12 AM 5.8 (H) 4.8 - 5.6 % Final    Comment:    (NOTE) Pre diabetes:          5.7%-6.4%  Diabetes:              >6.4%  Glycemic control for   <7.0% adults with diabetes     CBG: Recent Labs  Lab 06/30/2021 0130 07/27/2021 0330 07/27/2021 0811 07/20/2021 1151 07/08/21 1204  GLUCAP 228* 150* 134* 60* 159*    Critical care time:     Total critical care time: 55 minutes  Performed by: [place] care time was exclusive of separately billable procedures and treating other patients.   Critical care was necessary to treat or prevent imminent or life-threatening deterioration.   Critical care was time spent personally by me on the following activities: development of treatment plan with patient and/or surrogate as well as nursing, discussions with consultants, evaluation of patient's response to treatment, examination of patient, obtaining history from patient or surrogate, ordering and performing treatments and interventions, ordering and review of laboratory studies, ordering and review of radiographic studies, pulse oximetry and re-evaluation of patient's condition.   Jacky Kindle, MD [facility] See Amion for pager If no response to pager, please call [phone] until 7pm After 7pm, Please call E-link [phone]

## 2021-07-08 NOTE — Progress Notes (Signed)
ANTICOAGULATION and ANTIBIOTIC CONSULT NOTE - Initial Consult  Pharmacy Consult for heparin and vancomycin/Zosyn Indication:  h/o PE/DVT and sepsis/aspiration  No Known Allergies  Vital Signs: Temp: 103.3 F (39.6 C) (06/09 2224) Temp Source: Rectal (06/09 2224) BP: 115/79 (06/10 0005) Pulse Rate: 89 (06/09 2355)  Labs: Recent Labs    07/27/2021 2241 07/18/2021 2243 07/09/2021 2305  HGB 12.0 11.2* 12.4  HCT 41.0 33.0* 40.7  PLT 225  --  234  APTT  --   --  36  LABPROT  --   --  27.6*  INR  --   --  2.6*  CREATININE 5.44*  --  5.30*    Medical History: Past Medical History:  Diagnosis Date   Acute pulmonary embolism (HCC) 02/19/2015   Acute respiratory failure (HCC)    Diabetes mellitus without complication (HCC)    Type 2, W/o complications   DVT (deep venous thrombosis) (HCC) 04/05/2015   Dysphagia    Epilepsy (HCC)    GERD (gastroesophageal reflux disease)    Hyperlipidemia    Hypertension    IBS (irritable bowel syndrome)    Nontraumatic subarachnoid hemorrhage (HCC)    SAH (subarachnoid hemorrhage) (HCC)    Urinary retention     Medications:  Medications Prior to Admission  Medication Sig Dispense Refill Last Dose   acetaminophen (TYLENOL) 325 MG tablet Take 650 mg by mouth every 6 (six) hours.   07/03/2021   acetaminophen (TYLENOL) 325 MG tablet Take 650 mg by mouth every 6 (six) hours as needed for mild pain.   unk   apixaban (ELIQUIS) 5 MG TABS tablet Take 5 mg by mouth daily.   07/13/2021 at 0600   baclofen (LIORESAL) 10 MG tablet Take 5 mg by mouth 3 (three) times daily. Hold for HR/Pulse less than 60   07/20/2021   barrier cream (NON-SPECIFIED) CREA Apply 1 application  topically every 2 (two) hours as needed (redness on buttocks).   unk   bethanechol (URECHOLINE) 10 MG tablet Take 10 mg by mouth 3 (three) times daily.   07/17/2021   busPIRone (BUSPAR) 5 MG tablet Take 10 mg by mouth 2 (two) times daily.   07/15/2021   docusate sodium (COLACE) 100 MG capsule Take 100  mg by mouth daily.   07/19/2021   gabapentin (NEURONTIN) 100 MG capsule Take 200 mg by mouth 2 (two) times daily.   07/21/2021   guaiFENesin (MUCINEX) 600 MG 12 hr tablet Take 600 mg by mouth every 6 (six) hours as needed for cough.   unk   levETIRAcetam (KEPPRA) 100 MG/ML solution Take 7.5 mLs (750 mg total) by mouth 2 (two) times daily. 473 mL 12 06/29/2021   losartan (COZAAR) 25 MG tablet Take 1 tablet (25 mg total) by mouth daily. 30 tablet 2 07/15/2021   Melatonin 10 MG TABS Take 10 mg by mouth at bedtime.   07/06/2021   metoprolol tartrate (LOPRESSOR) 25 mg/10 mL SUSP Take 20 mLs (50 mg total) by mouth 2 (two) times daily. 100 mL 2 07/25/2021 at 0600   mirtazapine (REMERON) 15 MG tablet Take 7.5 mg by mouth at bedtime.   07/06/2021   NON FORMULARY Apply 1 application topically 2 (two) times daily. Hand palm protector   07/06/2021   Nutritional Supplements (FEEDING SUPPLEMENT, JEVITY 1.2 CAL,) LIQD Place 1,000 mLs into feeding tube continuous. 54ml per hour per G tube continuously- start infusion daily at 0700 and stop at 2300   07/04/2021   Nutritional Supplements (NUTRITIONAL SUPPLEMENT PO)  Take 237 mLs by mouth 2 (two) times daily. Ensure   07/15/21   polyethylene glycol (MIRALAX / GLYCOLAX) packet Take 17 g by mouth daily.   07/15/21   Probiotic Product (PROBIOTIC DAILY PO) Take 1 capsule by mouth 2 (two) times daily.   07-15-2021   rosuvastatin (CRESTOR) 5 MG tablet Take 5 mg by mouth daily.   Jul 15, 2021   sertraline (ZOLOFT) 50 MG tablet Take 25 mg by mouth daily.   2021-07-15   traZODone (DESYREL) 150 MG tablet Take 150 mg by mouth at bedtime.   07/06/2021   urea (CARMOL) 40 % CREA Apply 1 application topically daily. To both feet   July 15, 2021   Scheduled:   acetaminophen  650 mg Oral Q6H   bethanechol  10 mg Oral TID   Chlorhexidine Gluconate Cloth  6 each Topical Q0600   docusate sodium  100 mg Oral Daily   gabapentin  200 mg Oral BID   heparin  5,000 Units Subcutaneous Q8H   insulin aspart  1-3 Units  Subcutaneous Q4H   levETIRAcetam  750 mg Oral BID   pantoprazole (PROTONIX) IV  40 mg Intravenous QHS   polyethylene glycol  17 g Oral Daily   rosuvastatin  5 mg Oral Daily   sertraline  25 mg Oral Daily   Infusions:   sodium chloride 999 mL/hr at 2021/07/15 2330   sodium chloride     lactated ringers     norepinephrine (LEVOPHED) Adult infusion 40 mcg/min (15-Jul-2021 2340)   vasopressin 0.03 Units/min (07/15/2021 2348)    Assessment: 56yo female found unresponsive at NH, now intubated with concern for sepsis with aspiration noted by EDP >> to begin IV ABX.  Pt is on Eliquis PTA (last dose 6/9 am) for h/o VTE >> to transition to UFH.  Pt w/ AKI, SCr baseline <1, now 5.  Goal of Therapy:  Heparin level 0.3-0.7 units/ml aPTT 66-102 seconds Monitor platelets by anticoagulation protocol: Yes   Plan:  Rec'd vanc 1g in ED; will monitor SCr +/- vanc levels prior to redosing given AKI. Zosyn 3.375g IV Q12H (extended infusion). Heparin 900 units/hr (previously therapeutic at this rate) and monitor heparin levels, aPTT (while Eliquis affects anti-Xa), and CBC.  Vernard Gambles, PharmD, BCPS  07/07/2021,12:46 AM

## 2021-07-08 NOTE — Progress Notes (Signed)
Patient ID: Rachel Vang, female   DOB: 1965/03/13, 56 y.o.   MRN: [id number] [facility] Surgery Progress Note     Subjective: CC-  Sedated on the vent. Stable pressor requirements, levo 40 and vaso 0.04 720cc out from G tube since placing to gravity, plus >100cc currently in the bag since emptying 2 hours ago. Lactic acid >9 >> 7 >> 6.9  Objective: Vital signs in last 24 hours: Temp:  [99.9 F (37.7 C)-103.3 F (39.6 C)] 100.2 F (37.9 C) (06/10 0700) Pulse Rate:  [89-184] 150 (06/10 0819) Resp:  [18-38] 30 (06/10 0819) BP: (38-133)/(22-107) 110/95 (06/10 0700) SpO2:  [46 %-96 %] 79 % (06/10 0819) Arterial Line BP: (94-115)/(50-72) 97/53 (06/10 0700) FiO2 (%):  [100 %] 100 % (06/10 0819) Weight:  [67.2 kg] 67.2 kg (06/10 0340) Last BM Date :  (pta)  Intake/Output from previous day: 06/09 0701 - 06/10 0700 In: 7903.7 [I.V.:3031.8; IV Piggyback:4806.8] Out: 1865 [Urine:1145; Drains:720] Intake/Output this shift: No intake/output data recorded.  PE: Gen:  sedated on the vent HEENT: ETT in place Card:  tachy Pulm:  mechanically ventilated Abd: distended but soft, PEG to gravity drainage with dark brown fluid draining, hypoactive bowel sounds  Lab Results:  Recent Labs    07/17/2021 2305 Jul 21, 2021 0150 07-21-2021 0455 07-21-21 0536 07/21/21 0739  WBC 6.3  --  1.0*  --   --   HGB 12.4   < > 9.6* 8.2* 9.5*  HCT 40.7   < > 29.8* 24.0* 28.0*  PLT 234  --  136*  --   --    < > = values in this interval not displayed.   BMET Recent Labs    07/21/21 0228 July 21, 2021 0411 21-Jul-2021 0536 07-21-21 0739  NA 144 146* 148* 146*  K 4.2 4.1 3.4* 4.5  CL 111 111  --   --   CO2 21* 22  --   --   GLUCOSE 184* 144*  --   --   BUN 68* 67*  --   --   CREATININE 3.99* 3.83*  --   --   CALCIUM 8.0* 8.3*  --   --    PT/INR Recent Labs    07/15/2021 2305  LABPROT 27.6*  INR 2.6*   CMP     Component Value Date/Time   NA 146 (H) Jul 21, 2021 0739   NA 138 06/28/2020 1230    K 4.5 2021-07-21 0739   CL 111 2021-07-21 0411   CO2 22 2021-07-21 0411   GLUCOSE 144 (H) 21-Jul-2021 0411   BUN 67 (H) 07-21-2021 0411   BUN 21 06/28/2020 1230   CREATININE 3.83 (H) 07-21-21 0411   CALCIUM 8.3 (L) 07-21-21 0411   PROT 6.7 07/04/2021 2241   ALBUMIN 2.6 (L) 07/13/2021 2241   AST 43 (H) 07/09/2021 2241   ALT 37 07/19/2021 2241   ALKPHOS 61 07/21/2021 2241   BILITOT 0.6 07/25/2021 2241   GFRNONAA 13 (L) 21-Jul-2021 0411   GFRAA >60 09/08/2017 0520   Lipase     Component Value Date/Time   LIPASE 62 (H) 12/11/2020 2254       Studies/Results: EEG adult  Result Date: 2021-07-21 Charlsie Quest, MD     2021/07/21  2:03 AM Patient Name: Rachel Vang MRN: [id number] Epilepsy Attending: Charlsie Quest Referring Physician/Provider: Steffanie Dunn, DO Date: 2021/07/21 Duration: 21.10 mins Patient history: 56yo F with ams. EEG to evaluate for seizure Level of alertness:  lethargic AEDs during EEG study:  LEV, GBP Technical aspects: This EEG study was done with scalp electrodes positioned according to the 10-20 International system of electrode placement. Electrical activity was acquired at a sampling rate of  and reviewed with a high frequency filter of  and a low frequency filter of . EEG data were recorded continuously and digitally stored. Description: EEG showed continuous generalized 3 to 6 Hz theta-delta slowing. Hyperventilation and photic stimulation were not performed.   Of note, study was technically difficult due to ventilator artifact seen throughout the study ABNORMALITY - Continuous slow, generalized IMPRESSION: This technically difficult study is suggestive of severe diffuse encephalopathy, nonspecific etiology. No seizures or epileptiform discharges were seen throughout the recording. Priyanka Annabelle Harman   CT ABDOMEN PELVIS WO CONTRAST  Result Date: 2021/08/04 CLINICAL DATA:  Found unresponsive.  Bowel obstruction suspected EXAM: CT ABDOMEN AND  PELVIS WITHOUT CONTRAST TECHNIQUE: Multidetector CT imaging of the abdomen and pelvis was performed following the standard protocol without IV contrast. RADIATION DOSE REDUCTION: This exam was performed according to the departmental dose-optimization program which includes automated exposure control, adjustment of the mA and/or kV according to patient size and/or use of iterative reconstruction technique. COMPARISON:  12/12/2020 FINDINGS: Lower chest: Airspace opacities in both lower lobes concerning for pneumonia. Aspiration possible. Heart is normal size. No effusions. Hepatobiliary: No focal hepatic abnormality. Gallbladder unremarkable. Pancreas: No focal abnormality or ductal dilatation. Spleen: No focal abnormality.  Normal size. Adrenals/Urinary Tract: Moderate bilateral hydronephrosis. Possible layering bladder stones posteriorly. No visible ureteral stones. Urinary bladder moderately distended. Foley catheter balloon appears to be located inferior to the bladder, possibly within the urethra. Recommend repositioning. Adrenal glands unremarkable. Stomach/Bowel: Gastrostomy tube and NG tube are in the stomach. Large bowel is decompressed. There is gaseous distention of stomach and small bowel loops with air-fluid levels. Findings concerning for distal small bowel obstruction. Swirled appearance of the mesenteric vessels in the lower abdomen and upper pelvis raises possibility of volvulus or internal hernia. Vascular/Lymphatic: Aortic atherosclerosis. No evidence of aneurysm or adenopathy. Reproductive: Prior hysterectomy.  No adnexal masses. Other: No free fluid or free air. Musculoskeletal: No acute bony abnormality. IMPRESSION: Dilated stomach and small bowel with air-fluid levels. Findings compatible with distal small bowel obstruction. Swirled appearance of bowel and mesenteric vessels in the lower abdomen and upper pelvis raise the possibility of volvulus or internal hernia. Airspace opacities in both  lower lobes most compatible with pneumonia, possibly aspiration related. Bilateral hydronephrosis to the level of the bladder. Bladder is distended. Foley catheter balloon appears to be below the bladder, likely within the urethra. Recommend repositioning. Aortic atherosclerosis. Electronically Signed   By: Charlett Nose M.D.   On: 08/04/2021 00:50   CT HEAD WO CONTRAST ( )  Result Date: 04-Aug-2021 CLINICAL DATA:  Altered mental status, nontraumatic. Found unresponsive EXAM: CT HEAD WITHOUT CONTRAST TECHNIQUE: Contiguous axial images were obtained from the base of the skull through the vertex without intravenous contrast. RADIATION DOSE REDUCTION: This exam was performed according to the departmental dose-optimization program which includes automated exposure control, adjustment of the mA and/or kV according to patient size and/or use of iterative reconstruction technique. COMPARISON:  02/04/2015 FINDINGS: Brain: Encephalomalacia within the medial frontal lobes and bilateral sylvian fissures, left anterior temporal lobe. Extensive chronic small vessel disease throughout the deep white matter. No hydrocephalus, acute infarction or hemorrhage. Vascular: A-comm aneurysm coils noted, stable. No hyperdense vessel. Skull: No acute calvarial abnormality. 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 Extensive chronic small vessel disease. No acute intracranial abnormality. Electronically Signed   By: Charlett NoseKevin  Dover M.D.   On: 06/29/2021 00:44   DG Abd 1 View  Result Date: 07/22/2021 CLINICAL DATA:  Vomiting EXAM: ABDOMEN - 1 VIEW COMPARISON:  12/12/2020 FINDINGS: Gastrostomy tube projects over the midline. OG tube tip in the midline, likely within the proximal stomach. Diffuse gaseous distention of bowel. This appears to be both large and small bowel, favor ileus. No  visible free air. IMPRESSION: Diffuse gaseous distention of bowel, favor ileus. Electronically Signed   By: Charlett NoseKevin  Dover M.D.   On: 07/18/2021 00:00   DG Chest Portable 1 View  Result Date: 07/06/2021 CLINICAL DATA:  Central line placement. EXAM: PORTABLE CHEST 1 VIEW COMPARISON:  Chest x-ray 07/18/2021. FINDINGS: Endotracheal tube tip is 1.8 cm above the carina. There is a new enteric tube with distal tip in the proximal stomach just below the gastroesophageal junction. Left-sided central venous catheter tip projects over the SVC. The patient is rotated. Cardiomediastinal silhouette is grossly unchanged. Bilateral multifocal airspace disease has not significantly changed. No pleural effusion or pneumothorax identified. Osseous structures are stable. Dilated small bowel loops in the upper abdomen persists. IMPRESSION: 1. Nasogastric tube in the proximal stomach just beyond the gastroesophageal junction. Recommend advancing tube. 2. Central venous catheter tip projects over the SVC. 3. Stable bilateral airspace disease. 4. Stable dilated small bowel loops compatible small bowel obstruction. Electronically Signed   By: Darliss CheneyAmy  Guttmann M.D.   On: 07/25/2021 23:46   DG Chest Portable 1 View  Result Date: 07/18/2021 CLINICAL DATA:  post intubation EXAM: PORTABLE CHEST 1 VIEW.  Patient is rotated. COMPARISON:  CT abdomen pelvis 12/12/2020 FINDINGS: Endotracheal tube with tip terminating 2.5 cm above the carina. Enteric tube with tip coursing below the hemidiaphragm and side port overlying the expected region of the distal esophagus. The heart and mediastinal contours are unchanged. Low lung volumes. Query left lower lobe patchy airspace opacity. No pulmonary edema. No pleural effusion. No pneumothorax. No acute osseous abnormality. Gaseous dilatation of the small bowel noted within the upper abdomen. IMPRESSION: 1. Enteric tube with tip coursing below the hemidiaphragm and side port overlying the expected region of  the distal esophagus. Recommend advancing by 10 cm. 2. Low lung volumes with query left lower lobe patchy airspace opacity. Recommend PA and lateral view of the chest. 3. Gaseous dilatation of the small bowel noted within the upper abdomen suggestive of obstruction. Recommend CT abdomen pelvis with intravenous contrast. Electronically Signed   By: Tish FredericksonMorgane  Naveau M.D.   On: 07/03/2021 22:29    Anti-infectives: Anti-infectives (From admission, onward)    Start     Dose/Rate Route Frequency Ordered Stop   07/13/2021 1000  piperacillin-tazobactam (ZOSYN) IVPB 3.375 g        3.375 g 12.5 mL/hr over 240 Minutes Intravenous Every 12 hours 07/27/2021 0101     07/13/2021 0101  vancomycin variable dose per unstable renal function (pharmacist dosing)         Does not apply See admin instructions 07/02/2021 0101     07/20/2021 2300  piperacillin-tazobactam (ZOSYN) IVPB 3.375 g        3.375 g 100 mL/hr over 30 Minutes Intravenous  Once 07/09/2021 2253 07/16/2021 2336   07/18/2021 2230  ceFEPIme (MAXIPIME) 2 g in sodium chloride 0.9 % 100 mL IVPB  Status:  Discontinued        2 g 200  mL/hr over 30 Minutes Intravenous  Once 07/13/2021 2215 07/20/2021 2253   07/17/2021 2230  metroNIDAZOLE (FLAGYL) IVPB 500 mg  Status:  Discontinued        500 mg 100 mL/hr over 60 Minutes Intravenous  Once 07/22/2021 2215 07/25/2021 2253   07/06/2021 2230  vancomycin (VANCOCIN) IVPB 1000 mg/200 mL premix        1,000 mg 200 mL/hr over 60 Minutes Intravenous  Once 07/11/2021 2215 07/09/2021 0035        Assessment/Plan SBO - CT shows dilated stomach and small bowel compatible with distal small bowel obstruction, swirled appearance of bowel and mesenteric vessels in the lower abdomen and upper pelvis raise the possibility of volvulus or internal hernia (somewhat similar to CT scan from 2022) - patient sedated on the vent therefore unsure if she is very tender, but abdomen is overall soft and not rigid - Continue PEG to gravity. 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

## 2021-07-08 NOTE — Progress Notes (Signed)
eLink Physician-Brief Progress Note Patient Name: Rachel Vang DOB: 19-Feb-1965 MRN: [id number]   Date of Service  07/04/2021  HPI/Events of Note  Patient dropped her blood pressure down to a systolic BP of 60, and her saturation dropped as well, EKG showed sinus rhythm, she had received 100 mcg of Fentanyl for PRN sedation a little before the change, NS 250 ml iv bolus x 1 ordered, Phenylephrine gtt ordered, one amp of Bicarb pushed iv, stat ABG ordered, as well as stat portable CXR, ABG showed PH of 7.35 but PO2 was 40, PEEP increased to 14, she is on 100 % FiO2, her son Rachel Vang was called and updated, she remains a full code. BP now 102/55, MAP 72.  eICU Interventions  Plan is to follow up CXR to r/o pneumothorax, and to repeat ABG in 30 minutes, if hypoxemia persists consideration will be given to paralyzing her and starting a Versed gtt + / -  considering proning her if her hemodynamic situation permits.        Thomasene Lot [name] 07/23/2021, 11:07 PM

## 2021-07-08 NOTE — Progress Notes (Signed)
Date and time results received: 08-01-21 0540 (use smartphrase ".now" to insert current time)  Test: WBC Critical Value: 1.0  Name of Provider Notified: Elink RN/Elink MD

## 2021-07-08 NOTE — Progress Notes (Signed)
Pt transported on vent  from 3M13 to CT and back without any complications. RN at bedisde, RT will continue to monitor.

## 2021-07-08 NOTE — Progress Notes (Signed)
Pt arrived to 3M13. Removed old foley catheter per order. Upon removal thick, brown pee spilled out onto the bed. Prior to insertion of new foley catheter, extensive peri care completed by multiple RNs. MD aware. UA sent.

## 2021-07-08 NOTE — Consult Note (Signed)
Consulting Physician: Nickola Major Vinson Tietze  Referring Provider: Dr. Carlis Abbott -CCM  Chief Complaint: Acute respiratory failure  Reason for Consult: Bowel obstruction   Subjective   HPI: Rachel Vang is an 56 y.o. female who is here for acute respiratory failure.  Ms. Mleczko is bedbound at baseline and mostly dependent on PEG tube feeds.  She was unresponsive at [facility].  She was intubated by EMS and brought to the ER where the ICU team has taken over her care.    She does communicate, sometimes eats, watches TV.  She had a tracheostomy in the past.  She has a chronic PEG and Foley.    Past Medical History:  Diagnosis Date   Acute pulmonary embolism ([place]) 02/19/2015   Acute respiratory failure (HCC)    Diabetes mellitus without complication (HCC)    Type 2, W/o complications   DVT (deep venous thrombosis) ([place]) 04/05/2015   Dysphagia    Epilepsy (HCC)    GERD (gastroesophageal reflux disease)    Hyperlipidemia    Hypertension    IBS (irritable bowel syndrome)    Nontraumatic subarachnoid hemorrhage (HCC)    SAH (subarachnoid hemorrhage) ([place])    Urinary retention     Past Surgical History:  Procedure Laterality Date   ABDOMINAL SURGERY     ANEURYSM COILING     COLONOSCOPY N/A 02/20/2015   Procedure: COLONOSCOPY;  Surgeon: Gatha Mayer, MD;  Location: [place];  Service: Endoscopy;  Laterality: N/A;   ESOPHAGOGASTRODUODENOSCOPY (EGD) WITH PROPOFOL N/A 02/02/2015   Procedure: ESOPHAGOGASTRODUODENOSCOPY (EGD) WITH PROPOFOL;  Surgeon: Judeth Horn, MD;  Location: [place];  Service: General;  Laterality: N/A;   IR GASTROSTOMY TUBE MOD SED  04/13/2020   IR GENERIC HISTORICAL  08/26/2015   IR GASTRIC TUBE PERC CHG W/O IMG GUIDE 08/26/2015 Aletta Edouard, MD [facility]   IR GENERIC HISTORICAL  09/14/2015   IR [place] GASTRO/COLONIC TUBE PERCUT W/FLUORO 09/14/2015 Darrell K Allred, PA-C [facility]   IR REPLACE G-TUBE SIMPLE WO FLUORO  06/08/2016   IR REPLACE  G-TUBE SIMPLE WO FLUORO  10/24/2016   IR REPLACE G-TUBE SIMPLE WO FLUORO  01/07/2017   PEG PLACEMENT N/A 02/02/2015   Procedure: PERCUTANEOUS ENDOSCOPIC GASTROSTOMY (PEG) PLACEMENT;  Surgeon: Judeth Horn, MD;  Location: [place];  Service: General;  Laterality: N/A;   RADIOLOGY WITH ANESTHESIA N/A 01/13/2015   Procedure: RADIOLOGY WITH ANESTHESIA;  Surgeon: Consuella Lose, MD;  Location: [employer];  Service: Radiology;  Laterality: N/A;   TRACHEOSTOMY      Family History  Problem Relation Age of Onset   Hypertension Other     Social:  reports that she has quit smoking. She has never used smokeless tobacco. She reports that she does not drink alcohol and does not use drugs.  Allergies: No Known Allergies  Medications: Current Outpatient Medications  Medication Instructions   acetaminophen (TYLENOL) 650 mg, Oral, Every 6 hours   acetaminophen (TYLENOL) 650 mg, Oral, Every 6 hours PRN   apixaban (ELIQUIS) 5 mg, Oral, Daily   baclofen (LIORESAL) 5 mg, Oral, 3 times daily, Hold for HR/Pulse less than 60   barrier cream (NON-SPECIFIED) CREA 1 application , Topical, Every 2 hours PRN   bethanechol (URECHOLINE) 10 mg, Oral, 3 times daily   busPIRone (BUSPAR) 10 mg, Oral, 2 times daily   docusate sodium (COLACE) 100 mg, Oral, Daily   gabapentin (NEURONTIN) 200 mg, Oral, 2 times daily   guaiFENesin (MUCINEX) 600 mg, Oral, Every 6 hours PRN  levETIRAcetam (KEPPRA) 750 mg, Oral, 2 times daily   losartan (COZAAR) 25 mg, Oral, Daily   Melatonin 10 mg, Oral, Daily at bedtime   metoprolol tartrate (LOPRESSOR) 50 mg, Oral, 2 times daily   mirtazapine (REMERON) 7.5 mg, Oral, Daily at bedtime   NON FORMULARY 1 application , Topical, 2 times daily, Hand palm protector   Nutritional Supplements (FEEDING SUPPLEMENT, JEVITY 1.2 CAL,) LIQD 1,000 mLs, Per Tube, Continuous, 98ml per hour per G tube continuously- start infusion daily at 0700 and stop at 2300   Nutritional Supplements (NUTRITIONAL  SUPPLEMENT PO) 237 mLs, Oral, 2 times daily, Ensure   polyethylene glycol (MIRALAX / GLYCOLAX) 17 g, Oral, Daily   Probiotic Product (PROBIOTIC DAILY PO) 1 capsule, Oral, 2 times daily   rosuvastatin (CRESTOR) 5 mg, Oral, Daily   sertraline (ZOLOFT) 25 mg, Oral, Daily   traZODone (DESYREL) 150 mg, Oral, Daily at bedtime   urea (CARMOL) 40 % CREA 1 application , Topical, Daily, To both feet    ROS - all of the below systems have been reviewed with the patient and positives are indicated with bold text General: chills, fever or night sweats Eyes: blurry vision or double vision ENT: epistaxis or sore throat Allergy/Immunology: itchy/watery eyes or nasal congestion Hematologic/Lymphatic: bleeding problems, blood clots or swollen lymph nodes Endocrine: temperature intolerance or unexpected weight changes Breast: new or changing breast lumps or nipple discharge Resp: cough, shortness of breath, or wheezing CV: chest pain or dyspnea on exertion GI: as per HPI GU: dysuria, trouble voiding, or hematuria MSK: joint pain or joint stiffness Neuro: TIA or stroke symptoms Derm: pruritus and skin lesion changes Psych: anxiety and depression  Objective   PE Blood pressure 106/62, pulse (!) 173, temperature (!) 101.8 F (38.8 C), resp. rate (!) 37, height 5\' 8"  (1.727 m), weight 67.2 kg, SpO2 92 %. Constitutional: intubated sedated Eyes: Moist conjunctiva; no lid lag; anicteric; PERRL Neck: Trachea midline; no thyromegaly Lungs: Ventilated, bilateral breath sounds CV: RRR; no palpable thrills; GI: Abd distended, PEG in place - NG not functioning - PEG placed to straight drain with over 1L dark red fluid out MSK: Normal range of motion of extremities; no clubbing/cyanosis Psychiatric: Appropriate affect; alert and oriented x3 Lymphatic: No palpable cervical or axillary lymphadenopathy  Results for orders placed or performed during the hospital encounter of 07/08/2021 (from the past 24 hour(s))   Resp Panel by RT-PCR (Flu A&B, Covid) Anterior Nasal Swab     Status: None   Collection Time: 07/06/2021 10:14 PM   Specimen: Anterior Nasal Swab  Result Value Ref Range   SARS Coronavirus 2 by RT PCR NEGATIVE NEGATIVE   Influenza A by PCR NEGATIVE NEGATIVE   Influenza B by PCR NEGATIVE NEGATIVE  Comprehensive metabolic panel     Status: Abnormal   Collection Time: 07/13/2021 10:41 PM  Result Value Ref Range   Sodium 152 (H) 135 - 145 mmol/L   Potassium 5.8 (H) 3.5 - 5.1 mmol/L   Chloride 117 (H) 98 - 111 mmol/L   CO2 13 (L) 22 - 32 mmol/L   Glucose, Bld 213 (H) 70 - 99 mg/dL   BUN 82 (H) 6 - 20 mg/dL   Creatinine, Ser 5.44 (H) 0.44 - 1.00 mg/dL   Calcium 9.0 8.9 - 10.3 mg/dL   Total Protein 6.7 6.5 - 8.1 g/dL   Albumin 2.6 (L) 3.5 - 5.0 g/dL   AST 43 (H) 15 - 41 U/L   ALT 37 0 - 44  U/L   Alkaline Phosphatase 61 38 - 126 U/L   Total Bilirubin 0.6 0.3 - 1.2 mg/dL   GFR, Estimated 9 (L) >60 mL/min   Anion gap 22 (H) 5 - 15  CBC with Differential     Status: Abnormal   Collection Time: 07/01/2021 10:41 PM  Result Value Ref Range   WBC 8.9 4.0 - 10.5 K/uL   RBC 3.91 3.87 - 5.11 MIL/uL   Hemoglobin 12.0 12.0 - 15.0 g/dL   HCT 41.0 36.0 - 46.0 %   MCV 104.9 (H) 80.0 - 100.0 fL   MCH 30.7 26.0 - 34.0 pg   MCHC 29.3 (L) 30.0 - 36.0 g/dL   RDW 16.5 (H) 11.5 - 15.5 %   Platelets 225 150 - 400 K/uL   nRBC 0.2 0.0 - 0.2 %   Neutrophils Relative % 77 %   Neutro Abs 6.8 1.7 - 7.7 K/uL   Lymphocytes Relative 15 %   Lymphs Abs 1.4 0.7 - 4.0 K/uL   Monocytes Relative 7 %   Monocytes Absolute 0.7 0.1 - 1.0 K/uL   Eosinophils Relative 0 %   Eosinophils Absolute 0.0 0.0 - 0.5 K/uL   Basophils Relative 0 %   Basophils Absolute 0.0 0.0 - 0.1 K/uL   WBC Morphology MILD LEFT SHIFT (1-5% METAS, OCC MYELO, OCC BANDS)    Immature Granulocytes 1 %   Abs Immature Granulocytes 0.09 (H) 0.00 - 0.07 K/uL  I-Stat arterial blood gas, ED     Status: Abnormal   Collection Time: 07/01/2021 10:43 PM   Result Value Ref Range   pH, Arterial 7.179 (LL) 7.35 - 7.45   pCO2 arterial 44.1 32 - 48 mmHg   pO2, Arterial 119 (H) 83 - 108 mmHg   Bicarbonate 15.9 (L) 20.0 - 28.0 mmol/L   TCO2 17 (L) 22 - 32 mmol/L   O2 Saturation 96 %   Acid-base deficit 11.0 (H) 0.0 - 2.0 mmol/L   Sodium 148 (H) 135 - 145 mmol/L   Potassium 5.2 (H) 3.5 - 5.1 mmol/L   Calcium, Ion 1.16 1.15 - 1.40 mmol/L   HCT 33.0 (L) 36.0 - 46.0 %   Hemoglobin 11.2 (L) 12.0 - 15.0 g/dL   Patient temperature 103.3 F    Collection site RADIAL, ALLEN'S TEST ACCEPTABLE    Drawn by RT    Sample type ARTERIAL    Comment NOTIFIED PHYSICIAN   Lactic acid, plasma     Status: Abnormal   Collection Time: 07/12/2021 11:05 PM  Result Value Ref Range   Lactic Acid, Venous >9.0 (HH) 0.5 - 1.9 mmol/L  CBC     Status: Abnormal   Collection Time: 07/17/2021 11:05 PM  Result Value Ref Range   WBC 6.3 4.0 - 10.5 K/uL   RBC 3.97 3.87 - 5.11 MIL/uL   Hemoglobin 12.4 12.0 - 15.0 g/dL   HCT 40.7 36.0 - 46.0 %   MCV 102.5 (H) 80.0 - 100.0 fL   MCH 31.2 26.0 - 34.0 pg   MCHC 30.5 30.0 - 36.0 g/dL   RDW 16.3 (H) 11.5 - 15.5 %   Platelets 234 150 - 400 K/uL   nRBC 0.3 (H) 0.0 - 0.2 %  Creatinine, serum     Status: Abnormal   Collection Time: 07/16/2021 11:05 PM  Result Value Ref Range   Creatinine, Ser 5.30 (H) 0.44 - 1.00 mg/dL   GFR, Estimated 9 (L) >60 mL/min  Protime-INR     Status: Abnormal   Collection  Time: 07/09/2021 11:05 PM  Result Value Ref Range   Prothrombin Time 27.6 (H) 11.4 - 15.2 seconds   INR 2.6 (H) 0.8 - 1.2  APTT     Status: None   Collection Time: 07/02/2021 11:05 PM  Result Value Ref Range   aPTT 36 24 - 36 seconds  I-Stat beta hCG blood, ED     Status: None   Collection Time: 07/17/2021 11:24 PM  Result Value Ref Range   I-stat hCG, quantitative <5.0 <5 mIU/mL   Comment 3          MRSA Next Gen by PCR, Nasal     Status: Abnormal   Collection Time: 07/16/2021 11:29 PM   Specimen: Urine, Catheterized; Nasal Swab   Result Value Ref Range   MRSA by PCR Next Gen DETECTED (A) NOT DETECTED  Urinalysis, Routine w reflex microscopic Urine, Catheterized     Status: Abnormal   Collection Time: 07/09/2021 12:50 AM  Result Value Ref Range   Color, Urine YELLOW YELLOW   APPearance TURBID (A) CLEAR   Specific Gravity, Urine 1.016 1.005 - 1.030   pH 7.0 5.0 - 8.0   Glucose, UA NEGATIVE NEGATIVE mg/dL   Hgb urine dipstick MODERATE (A) NEGATIVE   Bilirubin Urine NEGATIVE NEGATIVE   Ketones, ur NEGATIVE NEGATIVE mg/dL   Protein, ur 100 (A) NEGATIVE mg/dL   Nitrite NEGATIVE NEGATIVE   Leukocytes,Ua MODERATE (A) NEGATIVE   RBC / HPF >50 (H) 0 - 5 RBC/hpf   WBC, UA >50 (H) 0 - 5 WBC/hpf   Bacteria, UA NONE SEEN NONE SEEN  Glucose, capillary     Status: Abnormal   Collection Time: 07/13/2021  1:30 AM  Result Value Ref Range   Glucose-Capillary 228 (H) 70 - 99 mg/dL  I-STAT 7, (LYTES, BLD GAS, ICA, H+H)     Status: Abnormal   Collection Time: 07/12/2021  1:50 AM  Result Value Ref Range   pH, Arterial 7.148 (LL) 7.35 - 7.45   pCO2 arterial 56.3 (H) 32 - 48 mmHg   pO2, Arterial 73 (L) 83 - 108 mmHg   Bicarbonate 19.5 (L) 20.0 - 28.0 mmol/L   TCO2 21 (L) 22 - 32 mmol/L   O2 Saturation 89 %   Acid-base deficit 9.0 (H) 0.0 - 2.0 mmol/L   Sodium 144 135 - 145 mmol/L   Potassium 4.5 3.5 - 5.1 mmol/L   Calcium, Ion 1.24 1.15 - 1.40 mmol/L   HCT 27.0 (L) 36.0 - 46.0 %   Hemoglobin 9.2 (L) 12.0 - 15.0 g/dL   Patient temperature 98.6 F    Collection site RADIAL, ALLEN'S TEST ACCEPTABLE    Drawn by RT    Sample type ARTERIAL    Comment NOTIFIED PHYSICIAN      Imaging Orders         DG Chest Portable 1 View         CT HEAD WO CONTRAST (5MM)         DG Chest Portable 1 View         DG Abd 1 View            US RENAL           CT ABDOMEN PELVIS WO CONTRAST   07/08/21 at 0005 Dilated stomach and small bowel with air-fluid levels. Findings compatible with distal small bowel obstruction. Swirled appearance of  bowel and mesenteric vessels in the lower abdomen and upper pelvis raise the possibility of volvulus or internal hernia.  Airspace opacities in both lower lobes most compatible with pneumonia, possibly aspiration related.   Bilateral hydronephrosis to the level of the bladder. Bladder is distended. Foley catheter balloon appears to be below the bladder, likely within the urethra. Recommend repositioning.       CT ABDOMEN PELVIS WO CONTRAST   12/12/20 Dilated loops of small bowel in the left upper abdomen, with possible transition in the left mid abdomen, raising concern for partial small bowel obstruction. Adynamic small bowel ileus is also possible but considered less likely.   No pneumatosis.  No abdominopelvic ascites.  No free air.   Gastrostomy in the stomach, in satisfactory position.     On my review of her CT scans, she does have a swirl sign in the lower abdomen, however this appears to be present in her CT scan from 12/12/20.     Assessment and Plan   JALIANA KOSCH is an 56 y.o. female, admitted to the ICU with multiple medical issues.  Surgery consult was placed for a small bowel obstruction.  She does appear to have an obstruction on CT.  Imaging is concerning for a swirl sign, however on review of her CT from 2022, there seems to be similar orientation of the mesentery so this may be a chronic issue.  I do not think rushing her off to the operating room is a good idea as she is dealing with bladder obstruction from foley obstruction with kidney injury, aspiration pneumonia and critical illness.  The CT findings may be chronic and her obstruction may improve with her bladder decompressed.  Protonix BID.  The surgery team will follow closely.  No diagnosis found.   Felicie Morn, MD  [facility] Use [email or web address] to contact on call provider  New Patient Billing: [phone] - High MDM

## 2021-07-08 NOTE — Progress Notes (Signed)
ANTICOAGULATION and ANTIBIOTIC CONSULT NOTE - Initial Consult  Pharmacy Consult for heparin and vancomycin/Zosyn Indication:  h/o PE/DVT and sepsis/aspiration  No Known Allergies  Vital Signs: Temp: 100.6 F (38.1 C) (06/10 0945) Temp Source: Bladder (06/10 0800) BP: 122/107 (06/10 0900) Pulse Rate: 139 (06/10 0915)  Labs: Recent Labs    07/21/2021 2241 07/14/2021 2243 07/28/2021 2305 07/06/2021 0150 07/15/2021 0228 07/21/2021 0411 06/29/2021 0455 07/11/2021 0536 07/04/2021 0739 06/30/2021 0921  HGB 12.0   < > 12.4   < >  --   --  9.6* 8.2* 9.5*  --   HCT 41.0   < > 40.7   < >  --   --  29.8* 24.0* 28.0*  --   PLT 225  --  234  --   --   --  136*  --   --   --   APTT  --   --  36  --   --   --   --   --   --  98*  LABPROT  --   --  27.6*  --   --   --   --   --   --   --   INR  --   --  2.6*  --   --   --   --   --   --   --   HEPARINUNFRC  --   --   --   --   --   --   --   --   --  >1.10*  CREATININE 5.44*  --  5.30*  --  3.99* 3.83*  --   --   --   --    < > = values in this interval not displayed.     Medical History: Past Medical History:  Diagnosis Date   Acute pulmonary embolism ([place]) 02/19/2015   Acute respiratory failure (HCC)    Diabetes mellitus without complication (HCC)    Type 2, W/o complications   DVT (deep venous thrombosis) ([place]) 04/05/2015   Dysphagia    Epilepsy (HCC)    GERD (gastroesophageal reflux disease)    Hyperlipidemia    Hypertension    IBS (irritable bowel syndrome)    Nontraumatic subarachnoid hemorrhage (HCC)    SAH (subarachnoid hemorrhage) (HCC)    Urinary retention     Medications:  Medications Prior to Admission  Medication Sig Dispense Refill Last Dose   acetaminophen (TYLENOL) 325 MG tablet Take 650 mg by mouth every 6 (six) hours.   07/03/2021   acetaminophen (TYLENOL) 325 MG tablet Take 650 mg by mouth every 6 (six) hours as needed for mild pain.   unk   apixaban (ELIQUIS) 5 MG TABS tablet Take 5 mg by mouth daily.   07/03/2021 at 0600    baclofen (LIORESAL) 10 MG tablet Take 5 mg by mouth 3 (three) times daily. Hold for HR/Pulse less than 60   07/17/2021   barrier cream (NON-SPECIFIED) CREA Apply 1 application  topically every 2 (two) hours as needed (redness on buttocks).   unk   bethanechol (URECHOLINE) 10 MG tablet Take 10 mg by mouth 3 (three) times daily.   07/06/2021   busPIRone (BUSPAR) 5 MG tablet Take 10 mg by mouth 2 (two) times daily.   07/05/2021   docusate sodium (COLACE) 100 MG capsule Take 100 mg by mouth daily.   07/05/2021   gabapentin (NEURONTIN) 100 MG capsule Take 200 mg by mouth 2 (two) times daily.  07/20/2021   guaiFENesin (MUCINEX) 600 MG 12 hr tablet Take 600 mg by mouth every 6 (six) hours as needed for cough.   unk   levETIRAcetam (KEPPRA) 100 MG/ML solution Take 7.5 mLs (750 mg total) by mouth 2 (two) times daily. 473 mL 12 07/13/2021   losartan (COZAAR) 25 MG tablet Take 1 tablet (25 mg total) by mouth daily. 30 tablet 2 07/24/2021   Melatonin 10 MG TABS Take 10 mg by mouth at bedtime.   07/06/2021   metoprolol tartrate (LOPRESSOR) 25 mg/10 mL SUSP Take 20 mLs (50 mg total) by mouth 2 (two) times daily. 100 mL 2 07/23/2021 at 0600   mirtazapine (REMERON) 15 MG tablet Take 7.5 mg by mouth at bedtime.   07/06/2021   NON FORMULARY Apply 1 application topically 2 (two) times daily. Hand palm protector   07/04/2021   Nutritional Supplements (FEEDING SUPPLEMENT, JEVITY 1.2 CAL,) LIQD Place 1,000 mLs into feeding tube continuous. 76m per hour per G tube continuously- start infusion daily at 0700 and stop at 2300   06/29/2021   Nutritional Supplements (NUTRITIONAL SUPPLEMENT PO) Take 237 mLs by mouth 2 (two) times daily. Ensure   07/09/2021   polyethylene glycol (MIRALAX / GLYCOLAX) packet Take 17 g by mouth daily.   07/06/2021   Probiotic Product (PROBIOTIC DAILY PO) Take 1 capsule by mouth 2 (two) times daily.   07/18/2021   rosuvastatin (CRESTOR) 5 MG tablet Take 5 mg by mouth daily.   07/06/2021   sertraline (ZOLOFT) 50 MG tablet Take  25 mg by mouth daily.   07/19/2021   traZODone (DESYREL) 150 MG tablet Take 150 mg by mouth at bedtime.   07/06/2021   urea (CARMOL) 40 % CREA Apply 1 application topically daily. To both feet   07/25/2021   Scheduled:   chlorhexidine gluconate (MEDLINE KIT)  15 mL Mouth Rinse BID   Chlorhexidine Gluconate Cloth  6 each Topical Q0600   insulin aspart  1-3 Units Subcutaneous Q4H   mouth rinse  15 mL Mouth Rinse 10 times per day   mupirocin ointment  1 application  Nasal BID   pantoprazole (PROTONIX) IV  40 mg Intravenous Q12H   vancomycin variable dose per unstable renal function (pharmacist dosing)   Does not apply See admin instructions   Infusions:   sodium chloride 10 mL/hr at 07/27/2021 0800   sodium chloride     dexmedetomidine (PRECEDEX) IV infusion 0.4 mcg/kg/hr (07/04/2021 0925)   heparin 900 Units/hr (07/16/2021 0800)   lactated ringers 150 mL/hr at 07/18/2021 0841   levETIRAcetam     magnesium sulfate bolus IVPB     norepinephrine (LEVOPHED) Adult infusion 42 mcg/min (07/22/2021 0853)   piperacillin-tazobactam (ZOSYN)  IV     vasopressin 0.04 Units/min (07/17/2021 0800)    Assessment: Rachel Vang found unresponsive at NH, now intubated with concern for sepsis with aspiration noted by EDP >> to begin IV ABX.  Pt is on Eliquis PTA (last dose 6/9 am) for h/o VTE >> to transition to UFH.  Pt w/ AKI, SCr baseline <1, now 5.  Heparin level >1.1 (prior eliquis exposure) Aptt 98 sec (on heparin 900 units/hr)  Goal of Therapy:  Heparin level 0.3-0.7 units/ml aPTT 66-102 seconds Monitor platelets by anticoagulation protocol: Yes   Plan:  Rec'd vanc 1g in ED;  Will monitor SCr +/- vanc levels prior to redosing given AKI. Zosyn 3.375g IV Q12H (extended infusion).  Continue Heparin 900 units/hr Monitor heparin levels, aPTT (while Eliquis affects anti-Xa),  and CBC. No signs/symptoms of bleed  Thank you for allowing pharmacy to be a part of this patient's care.  Donnald Garre,  PharmD Clinical Pharmacist  Please check AMION for all [place] numbers After 10:00 PM, call [place] [phone]

## 2021-07-08 NOTE — Op Note (Signed)
07/02/2021  3:47 PM  PATIENT:  Rachel Vang  56 y.o. female  PRE-OPERATIVE DIAGNOSIS:  Small bowel obstruction; septic shock  POST-OPERATIVE DIAGNOSIS:  Small bowel obstruction/closed loop obstruction; same  PROCEDURE:  Procedure(s): EXPLORATORY LAPAROTOMY LYSIS OF ADHESION x 30 min APPLICATION OF ABTHERA WOUND VAC  SURGEON:  Surgeon(s): Greer Pickerel, MD  ASSISTANTS: none   ANESTHESIA:   general  DRAINS: Urinary Catheter (Foley), Gastrostomy Tube (pre-existing), and abthera wound vac   LOCAL MEDICATIONS USED:  NONE  SPECIMEN:  No Specimen  DISPOSITION OF SPECIMEN:  N/A  COUNTS:  YES  INDICATION FOR PROCEDURE: This is a 56 year old lady who was found reportedly unresponsive in her care facility.  She was brought in overnight found to be in septic shock.  She had aspiration pneumonia.  She had acute renal failure.  She had a distended abdomen.  CT imaging revealed a small bowel obstruction.  She also had a bladder outlet obstruction secondary to her chronic Foley catheter.  It was replaced.  She was started on broad-spectrum antibiotics and required vasopressor support.  Throughout this morning she required more vasopressor support.  Her G-tube had been placed to gravity and she was draining dark almost blackish contents.  She had initially an improving lactate within the start to worsen as her vasopressor increased.  I was concerned that she was having ischemic bowel due to a potential internal hernia.  I had a discussion with the family recommending exploratory laparotomy versus comfort measures please see chart for additional details.  The family ultimately decided to proceed with exploratory laparotomy.  Again please see chart for additional details.  PROCEDURE: Patient was taken urgently to operating room 2 directly from ICU to the operating room.  She was placed upon operating table.  Patient had a chronic contracture and laid on the bed a little bit crooked.  Her abdomen  was prepped and draped in the usual standard surgical fashion with ChloraPrep and we Betadine around the G-tube and draped out of the field.  She was on broad-spectrum scheduled antibiotics.  She was now on the third vasopressor.  A surgical timeout was performed.  She had had a prior lower midline incision.  The lower midline incision was incised with a 10 blade and carried up and above the umbilicus slightly.  Subcutaneous tissue was divided and the fascia was divided and the abdominal cavity was entered.  There was gross dilation of her small bowel.  There is purulent fluid that came out of the abdominal cavity probably about 30 to 50 cc.  I initially tried placing a Balfour retractor but it appeared that she had dense adhesions in her left lower quadrant to her pelvic sidewall from what appeared to be prior gynecological procedure.  Ended up placing the Bookwalter retractor.  Using Metzenbaum scissors I was able to free and densely adhered piece of small bowel from the left pelvic sidewall.  Several interloop adhesions which were taken down with Metzenbaum scissors.  She had had a closed-loop obstruction.  Upon freeing this we could see that she had what appeared to be essentially linear tears in her small bowel mesentery in the section that had been involved in the closed-loop obstruction.  This section of the distal small bowel was probably about 20 to 30 cm from her terminal ileum.  The proximal small bowel was ran and it was dilated but viable.  The cecum and appendix was visualized and normal.  There is no enteric contents.  There was  no frank necrosis.  This section of small bowel that it involved in the close obstruction was viable however it was not still great in appearance compared to the remaining small bowel.  However it was not frankly ischemic and therefore I felt we should resect it today.  Since she was on multiple vasopressors and I felt we needed to do a second look to determine the viability  of this segment I placed an abdominal ABThera wound VAC and had a good seal.  All needle instrument sponge counts were correct x2.  There were no immediate complications.  The plan will bring the patient back in about 48 hours for second look with possible resection and hopefully closure  Upon entering the abdomen (organ space), I encountered purulence in the lower abdomen .  CASE DATA:  Type of patient?: DOW CASE (Surgical Hospitalist [facility])  Status of Case? EMERGENT Add On  Infection Present At Time Of Surgery (PATOS)?  PURULENCE at the in lower abdomen between mesenteric folds  PLAN OF CARE:  already admitted ICU  PATIENT DISPOSITION:  ICU - intubated and critically ill.   Delay start of Pharmacological VTE agent (>24hrs) due to surgical blood loss or risk of bleeding:  no  Leighton Ruff. Redmond Pulling, MD, FACS General, Bariatric, & Minimally Invasive Surgery [facility], [place]

## 2021-07-08 NOTE — Progress Notes (Signed)
Patient just arrived to floor not to long ago. I'm on standby while Rns and Nts are getting her situated.

## 2021-07-08 NOTE — Progress Notes (Signed)
eLink Physician-Brief Progress Note Patient Name: Rachel Vang DOB: [birth year] MRN: [id number]   Date of Service  07/19/2021  HPI/Events of Note  Patient is hypotensive and acidotic (predominantly respiratory but with a metabolic component.)  PH 7.14.  eICU Interventions  Respiratory rate increased from 22 to 30 on the ventilator, ABG in 1 hour. Albumin 5 % 500 ml iv x 1, Sodium Bicarbonate 50 meq iv x 1, Vasopressin gtt increased to 0.04 mcg. I have asked RT to insert an arterial line because the benefit clearly outweighs the risk in her current state.        Kassey Laforest U Dredyn Gubbels 07/06/2021, 2:01 AM

## 2021-07-08 NOTE — Procedures (Signed)
Patient Name: Rachel Vang  MRN: [id number]  Epilepsy Attending: Lora Havens  Referring Physician/Provider: Julian Hy, DO  Date: 07/23/2021 Duration: 21.10 mins  Patient history: 56yo F with ams. EEG to evaluate for seizure  Level of alertness:  lethargic   AEDs during EEG study: LEV, GBP  Technical aspects: This EEG study was done with scalp electrodes positioned according to the 10-20 International system of electrode placement. Electrical activity was acquired at a sampling rate of 500Hz  and reviewed with a high frequency filter of 70Hz  and a low frequency filter of 1Hz . EEG data were recorded continuously and digitally stored.   Description: EEG showed continuous generalized 3 to 6 Hz theta-delta slowing. Hyperventilation and photic stimulation were not performed.     Of note, study was technically difficult due to ventilator artifact seen throughout the study  ABNORMALITY - Continuous slow, generalized  IMPRESSION: This technically difficult study is suggestive of severe diffuse encephalopathy, nonspecific etiology. No seizures or epileptiform discharges were seen throughout the recording.  Carnella Fryman Barbra Sarks

## 2021-07-08 NOTE — Progress Notes (Signed)
eLink Physician-Brief Progress Note Patient Name: Rachel Vang DOB: 1965/12/15 MRN: [id number]   Date of Service  2021-07-28  HPI/Events of Note  Patient with marked sinus tachycardia likely in part due to sub-optimal sedation  / analgesia on the ventilator, volume status may also be a contributing factor.  eICU Interventions  Will order ow dose gtt of Versed + Fentanyl in place of large PRN Fentanyl boluses which may have a dramatic hemodynamic impact when administered, I've also left a low dose Precedex gtt on the [facility], available if needed as an adjunct. Will check a BNP to try to gauge volume status + check a CVP.        Migdalia Dk 2021-07-28, 11:39 PM

## 2021-07-08 NOTE — Progress Notes (Signed)
Med list updated by pharmacy tech. Reviewed and PTA meds reordered as indicated, including gabapentin and keppra. Holding baclofen due to AKI.  Labs now available and reviewed. LA still pending. Will repeat ABG & BMP following fluid resuscitation.  Steffanie Dunn, DO 07/17/2021 12:28 AM [facility]

## 2021-07-08 NOTE — Procedures (Signed)
Arterial Catheter Insertion Procedure Note  Rachel Vang  [id number]  27-Apr-1965  Date:07/09/2021  Time:2:12 AM    Provider Performing: Ulice Dash    Procedure: Insertion of Arterial Line [phone]) with US guidance [id number])   Indication(s) Blood pressure monitoring and/or need for frequent ABGs  Consent Risks of the procedure as well as the alternatives and risks of each were explained to the patient and/or caregiver.  Consent for the procedure was obtained and is signed in the bedside chart  Anesthesia None   Time Out Verified patient identification, verified procedure, site/side was marked, verified correct patient position, special equipment/implants available, medications/allergies/relevant history reviewed, required imaging and test results available.   Sterile Technique Maximal sterile technique including full sterile barrier drape, hand hygiene, sterile gown, sterile gloves, mask, hair covering, sterile ultrasound probe cover (if used).   Procedure Description Area of catheter insertion was cleaned with chlorhexidine and draped in sterile fashion. With real-time ultrasound guidance an arterial catheter was placed into the right radial artery.  Appropriate arterial tracings confirmed on monitor.     Complications/Tolerance None; patient tolerated the procedure well.   EBL Minimal   Specimen(s) None

## 2021-07-08 NOTE — Progress Notes (Signed)
ANTICOAGULATION CONSULT NOTE  Pharmacy Consult for heparin Indication: History of PE/DVT  No Known Allergies  Vital Signs: Temp: 102.7 F (39.3 C) (06/10 1415) Temp Source: Bladder (06/10 0800) BP: 122/107 (06/10 0900) Pulse Rate: 139 (06/10 1400)  Labs: Recent Labs    07/03/2021 2305 07/22/2021 0150 07/28/2021 0228 07/28/2021 0411 07/09/2021 0455 07/17/2021 0536 07/19/2021 0739 07/14/2021 0921 07/13/2021 1327 07/23/2021 1402 07/14/2021 1410  HGB 12.4   < >  --   --  9.6*   < > 9.9*  9.5*  --  8.7* 7.8* 13.3  HCT 40.7   < >  --   --  29.8*   < > 29.0*  28.0*  --  27.4* 23.0* 39.0  PLT 234  --   --   --  136*  --   --   --  125*  --   --   APTT 36  --   --   --   --   --   --  98*  --   --   --   LABPROT 27.6*  --   --   --   --   --   --   --   --   --   --   INR 2.6*  --   --   --   --   --   --   --   --   --   --   HEPARINUNFRC  --   --   --   --   --   --   --  >1.10*  --   --   --   CREATININE 5.30*  --  3.99* 3.83*  --   --  3.20*  --   --   --   --    < > = values in this interval not displayed.    Assessment: 56yo female found unresponsive at NH, now intubated with concern for sepsis with aspiration noted by EDP.  Pt is on Eliquis PTA (last dose 6/9 am) for history of VTE and Pharmacy consulted to dose IV heparin.  Pt w/ AKI, SCr baseline <1, now 5.  Patient is s/p ex-lap with LOA and application of wound vac on 6/10.  She did not undergo a resection and abdomen is left open.  Pharmacy asked to resume IV heparin this evening without a bolus.  Patient was previously therapeutic on 900 units/hr.  Surgery expects some oozing from the mesentery.  Goal of Therapy:  Heparin level 0.3-0.7 units/ml aPTT 66-102 seconds Monitor platelets by anticoagulation protocol: Yes   Plan:  At 2000, resume heparin infusion at 850 units/hr post surgery Monitor heparin levels, aPTT (while Eliquis affects anti-Xa), and CBC  Decklin Weddington D. Laney Potash, PharmD, BCPS, BCCCP 07/28/2021, 5:38 PM

## 2021-07-08 NOTE — Progress Notes (Signed)
Pt transported on vent from OR to 3M08 without any complications. RN at beside, RT will montior.

## 2021-07-08 NOTE — ED Provider Notes (Signed)
[facility] EMERGENCY DEPARTMENT Provider Note   CSN: [id number] Arrival date & time: 07/12/2021  2150     History  No chief complaint on file.   Rachel Vang is a 56 y.o. female.  Patient brought in by EMS from nursing facility.  When EMS got there patient was gray severely hypoxic and hypotensive.  It appeared the patient had been vomiting.  Patient is a full code.  They were told that the patient is essentially bedridden.  And patient is nonverbal.  Past medical his significant for hypertension epilepsy diabetes subarachnoid hemorrhage patient had feeding tube in place.  Patient had aneurysm coiling.  Patient had G-tube placed several times.       Home Medications Prior to Admission medications   Medication Sig Start Date End Date Taking? Authorizing Provider  acetaminophen (TYLENOL) 325 MG tablet Take 650 mg by mouth every 6 (six) hours.   Yes [provider]  acetaminophen (TYLENOL) 325 MG tablet Take 650 mg by mouth every 6 (six) hours as needed for mild pain.   Yes [provider]  apixaban (ELIQUIS) 5 MG TABS tablet Take 5 mg by mouth daily.   Yes [provider]  baclofen (LIORESAL) 10 MG tablet Take 5 mg by mouth 3 (three) times daily. Hold for HR/Pulse less than 60   Yes [provider]  barrier cream (NON-SPECIFIED) CREA Apply 1 application  topically every 2 (two) hours as needed (redness on buttocks).   Yes [provider]  bethanechol (URECHOLINE) 10 MG tablet Take 10 mg by mouth 3 (three) times daily. 09/08/17  Yes [provider]  busPIRone (BUSPAR) 5 MG tablet Take 10 mg by mouth 2 (two) times daily.   Yes [provider]  docusate sodium (COLACE) 100 MG capsule Take 100 mg by mouth daily.   Yes [provider]  gabapentin (NEURONTIN) 100 MG capsule Take 200 mg by mouth 2 (two) times daily. 09/08/17  Yes Almyra Free, MD  guaiFENesin (MUCINEX) 600 MG 12 hr tablet Take  600 mg by mouth every 6 (six) hours as needed for cough.   Yes [provider]  levETIRAcetam (KEPPRA) 100 MG/ML solution Take 7.5 mLs (750 mg total) by mouth 2 (two) times daily. 04/14/20  Yes Oswald Hillock, MD  losartan (COZAAR) 25 MG tablet Take 1 tablet (25 mg total) by mouth daily. 04/14/20  Yes Oswald Hillock, MD  Melatonin 10 MG TABS Take 10 mg by mouth at bedtime. 08/08/20  Yes [provider]  metoprolol tartrate (LOPRESSOR) 25 mg/10 mL SUSP Take 20 mLs (50 mg total) by mouth 2 (two) times daily. 04/14/20  Yes Oswald Hillock, MD  mirtazapine (REMERON) 15 MG tablet Take 7.5 mg by mouth at bedtime.   Yes [provider]  NON FORMULARY Apply 1 application topically 2 (two) times daily. Hand palm protector   Yes [provider]  Nutritional Supplements (FEEDING SUPPLEMENT, JEVITY 1.2 CAL,) LIQD Place 1,000 mLs into feeding tube continuous. 23ml per hour per G tube continuously- start infusion daily at 0700 and stop at 2300   Yes [provider]  Nutritional Supplements (NUTRITIONAL SUPPLEMENT PO) Take 237 mLs by mouth 2 (two) times daily. Ensure   Yes [provider]  polyethylene glycol (MIRALAX / GLYCOLAX) packet Take 17 g by mouth daily.   Yes [provider]  Probiotic Product (PROBIOTIC DAILY PO) Take 1 capsule by mouth 2 (two) times daily. 09/08/17  Yes [provider]  rosuvastatin (CRESTOR) 5 MG tablet Take 5 mg by mouth daily. 09/24/17  Yes [provider]  sertraline (ZOLOFT) 50 MG tablet Take 25 mg by mouth daily.   Yes [provider]  traZODone (DESYREL) 150 MG tablet Take 150 mg by mouth at bedtime.   Yes [provider]  urea (CARMOL) 40 % CREA Apply 1 application topically daily. To both feet   Yes [provider]      Allergies    Patient has no known allergies.    Review of Systems   Review of Systems  Unable to perform ROS: Patient unresponsive    Physical Exam Updated  Vital Signs BP (!) 98/52   Pulse (!) 152   Temp (!) 103.3 F (39.6 C) (Rectal)   Resp (!) 32   Ht 1.727 m (5\' 8" )   LMP  (LMP Unknown)   SpO2 96%   BMI 19.24 kg/m  Physical Exam Vitals and nursing note reviewed.  Constitutional:      General: She is in acute distress.     Appearance: She is well-developed. She is ill-appearing and toxic-appearing.     Comments: Patient gray in color.  Patient in significant respiratory distress.  Patient hypotensive.  Patient unresponsive  HENT:     Head: Normocephalic and atraumatic.  Eyes:     Conjunctiva/sclera: Conjunctivae normal.  Cardiovascular:     Rate and Rhythm: Normal rate and regular rhythm.     Heart sounds: No murmur heard. Pulmonary:     Effort: Pulmonary effort is normal. No respiratory distress.     Breath sounds: Normal breath sounds.  Abdominal:     General: There is distension.     Palpations: Abdomen is soft.     Tenderness: There is no abdominal tenderness.     Comments: G-tube left side of the abdomen  Musculoskeletal:        General: No swelling.     Cervical back: Neck supple.  Skin:    General: Skin is warm and dry.     Capillary Refill: Capillary refill takes less than 2 seconds.  Neurological:     Comments: Patient completely unresponsive.  No movement on her part.  Psychiatric:        Mood and Affect: Mood normal.     ED Results / Procedures / Treatments   Labs (all labs ordered are listed, but only abnormal results are displayed) Labs Reviewed  CBC WITH DIFFERENTIAL/PLATELET - Abnormal; Notable for the following components:      Result Value   MCV 104.9 (*)    MCHC 29.3 (*)    RDW 16.5 (*)    Abs Immature Granulocytes 0.09 (*)    All other components within normal limits  CBC - Abnormal; Notable for the following components:   MCV 102.5 (*)    RDW 16.3 (*)    nRBC 0.3 (*)    All other components within normal limits  CREATININE, SERUM - Abnormal; Notable for the following components:    Creatinine, Ser 5.30 (*)    GFR, Estimated 9 (*)    All other components within normal limits  PROTIME-INR - Abnormal; Notable for the following components:   Prothrombin Time 27.6 (*)    INR 2.6 (*)    All other components within normal limits  I-STAT ARTERIAL BLOOD GAS, ED - Abnormal; Notable for the following components:   pH, Arterial 7.179 (*)    pO2, Arterial 119 (*)    Bicarbonate 15.9 (*)  TCO2 17 (*)    Acid-base deficit 11.0 (*)    Sodium 148 (*)    Potassium 5.2 (*)    HCT 33.0 (*)    Hemoglobin 11.2 (*)    All other components within normal limits  RESP PANEL BY RT-PCR (FLU A&B, COVID) ARPGX2  CULTURE, BLOOD (ROUTINE X 2)  CULTURE, BLOOD (ROUTINE X 2)  URINE CULTURE  SARS CORONAVIRUS 2 BY RT PCR  MRSA NEXT GEN BY PCR, NASAL  APTT  LACTIC ACID, PLASMA  LACTIC ACID, PLASMA  COMPREHENSIVE METABOLIC PANEL  URINALYSIS, ROUTINE W REFLEX MICROSCOPIC  BLOOD GAS, ARTERIAL  CBC  BASIC METABOLIC PANEL  MAGNESIUM  PHOSPHORUS  HIV ANTIBODY (ROUTINE TESTING W REFLEX)  I-STAT BETA HCG BLOOD, ED ([facility], [facility], [facility] ONLY)    EKG None  Radiology DG Abd 1 View  Result Date: 07/15/2021 CLINICAL DATA:  Vomiting EXAM: ABDOMEN - 1 VIEW COMPARISON:  12/12/2020 FINDINGS: Gastrostomy tube projects over the midline. OG tube tip in the midline, likely within the proximal stomach. Diffuse gaseous distention of bowel. This appears to be both large and small bowel, favor ileus. No visible free air. IMPRESSION: Diffuse gaseous distention of bowel, favor ileus. Electronically Signed   By: Rolm Baptise M.D.   On: 07/16/2021 00:00   DG Chest Portable 1 View  Result Date: 07/18/2021 CLINICAL DATA:  Central line placement. EXAM: PORTABLE CHEST 1 VIEW COMPARISON:  Chest x-ray 07/03/2021. FINDINGS: Endotracheal tube tip is 1.8 cm above the carina. There is a new enteric tube with distal tip in the proximal stomach just below the gastroesophageal junction. Left-sided central venous catheter tip projects  over the SVC. The patient is rotated. Cardiomediastinal silhouette is grossly unchanged. Bilateral multifocal airspace disease has not significantly changed. No pleural effusion or pneumothorax identified. Osseous structures are stable. Dilated small bowel loops in the upper abdomen persists. IMPRESSION: 1. Nasogastric tube in the proximal stomach just beyond the gastroesophageal junction. Recommend advancing tube. 2. Central venous catheter tip projects over the SVC. 3. Stable bilateral airspace disease. 4. Stable dilated small bowel loops compatible small bowel obstruction. Electronically Signed   By: Ronney Asters M.D.   On: 07/14/2021 23:46   DG Chest Portable 1 View  Result Date: 07/20/2021 CLINICAL DATA:  post intubation EXAM: PORTABLE CHEST 1 VIEW.  Patient is rotated. COMPARISON:  CT abdomen pelvis 12/12/2020 FINDINGS: Endotracheal tube with tip terminating 2.5 cm above the carina. Enteric tube with tip coursing below the hemidiaphragm and side port overlying the expected region of the distal esophagus. The heart and mediastinal contours are unchanged. Low lung volumes. Query left lower lobe patchy airspace opacity. No pulmonary edema. No pleural effusion. No pneumothorax. No acute osseous abnormality. Gaseous dilatation of the small bowel noted within the upper abdomen. IMPRESSION: 1. Enteric tube with tip coursing below the hemidiaphragm and side port overlying the expected region of the distal esophagus. Recommend advancing by 10 cm. 2. Low lung volumes with query left lower lobe patchy airspace opacity. Recommend PA and lateral view of the chest. 3. Gaseous dilatation of the small bowel noted within the upper abdomen suggestive of obstruction. Recommend CT abdomen pelvis with intravenous contrast. Electronically Signed   By: Iven Finn M.D.   On: 07/01/2021 22:29    Procedures Procedure Name: Intubation Date/Time: 07/15/2021 12:08 AM  Performed by: Fredia Sorrow, MDPre-anesthesia  Checklist: Patient identified, Emergency Drugs available, Suction available, Patient being monitored and Timeout performed Oxygen Delivery Method: Ambu bag Preoxygenation: Pre-oxygenation with 100% oxygen Induction  Type: Rapid sequence Ventilation: Mask ventilation without difficulty Laryngoscope Size: Glidescope and 4 Nasal Tubes: Right Tube size: 7.5 mm Number of attempts: 1 Airway Equipment and Method: Rigid stylet Placement Confirmation: ETT inserted through vocal cords under direct vision, CO2 detector and Breath sounds checked- equal and bilateral Secured at: 24 cm Tube secured with: ETT holder Difficulty Due To: Difficulty was anticipated        Medications Ordered in ED Medications  etomidate (AMIDATE) injection (20 mg Intravenous Given 07/20/2021 2154)  succinylcholine (ANECTINE) injection (120 mg Intravenous Given 07/14/2021 2155)  0.9 %  sodium chloride infusion ( Intravenous Infusion Verify 07/03/2021 2330)  fentaNYL (SUBLIMAZE) injection 50 mcg (has no administration in time range)  fentaNYL (SUBLIMAZE) injection 50-200 mcg (100 mcg Intravenous Given 07/09/2021 2218)  norepinephrine (LEVOPHED) 4mg  in 257mL (0.016 mg/mL) premix infusion (40 mcg/min Intravenous Rate/Dose Change 07/17/2021 2340)  lactated ringers infusion (has no administration in time range)  lactated ringers bolus 1,000 mL (0 mLs Intravenous Stopped 07/05/2021 2307)    And  lactated ringers bolus 1,000 mL (1,000 mLs Intravenous New Bag/Given 07/21/2021 0002)  vancomycin (VANCOCIN) IVPB 1000 mg/200 mL premix (1,000 mg Intravenous New Bag/Given 07/03/2021 2335)  docusate (COLACE) 50 MG/5ML liquid 100 mg (has no administration in time range)  polyethylene glycol (MIRALAX / GLYCOLAX) packet 17 g (has no administration in time range)  heparin injection 5,000 Units (has no administration in time range)  pantoprazole (PROTONIX) injection 40 mg (40 mg Intravenous Given 07/06/2021 2337)  fentaNYL (SUBLIMAZE) injection 50-200 mcg (has no  administration in time range)  insulin aspart (novoLOG) injection 1-3 Units (has no administration in time range)  0.9 %  sodium chloride infusion (has no administration in time range)  vasopressin (PITRESSIN) 20 Units in sodium chloride 0.9 % 100 mL infusion-*FOR SHOCK* (0.03 Units/min Intravenous New Bag/Given 07/12/2021 2348)  acetaminophen (TYLENOL) suppository 650 mg (650 mg Rectal Given 07/09/2021 2226)  piperacillin-tazobactam (ZOSYN) IVPB 3.375 g (0 g Intravenous Stopped 07/14/2021 2336)  lactated ringers bolus 1,000 mL (1,000 mLs Intravenous New Bag/Given 07/09/2021 2332)    ED Course/ Medical Decision Making/ A&P                           Medical Decision Making Amount and/or Complexity of Data Reviewed Labs: ordered. Radiology: ordered. ECG/medicine tests: ordered.  Risk OTC drugs. Prescription drug management. Decision regarding hospitalization.   CRITICAL CARE Performed by: Fredia Sorrow Total critical care time: 60 minutes Critical care time was exclusive of separately billable procedures and treating other patients. Critical care was necessary to treat or prevent imminent or life-threatening deterioration. Critical care was time spent personally by me on the following activities: development of treatment plan with patient and/or surrogate as well as nursing, discussions with consultants, evaluation of patient's response to treatment, examination of patient, obtaining history from patient or surrogate, ordering and performing treatments and interventions, ordering and review of laboratory studies, ordering and review of radiographic studies, pulse oximetry and re-evaluation of patient's condition.  Patient brought in from a nursing home.  Patient not a DNR.  Patient nonverbal according to EMS.  Patient does and significant hypoxia and hypotensive in when they arrived.  They attempted to intubate her.  But could not get her to open her mouth far enough.  When I intubated her yet her  mouth does not open very far even when she was paralyzed.  But intubation was successful.  Was able to visualize endotracheal tube  going through the cords.  She had a lot of dark stuff coming up from her esophagus.  7 that was going down the trachea.  So certainly there was aspiration.  Immediately following intubation patient was given some Neo-Synephrine right before.  Patient then started on sepsis protocol.  Started on norepinephrine drip.  Critical care contacted.  Critical care came down and placed a central line.  Intubation patient's vital signs systolic about [id number].  But at times will drop down to like 78.  Heart rate remained very tachycardic in the 150s.  Respiratory rate in the 20s.  Temp was 103.3 which confirmed our clinical suspicions about aspiration and sepsis.  Chest x-ray read by radiology stable bilateral airspace disease.  Stable dilated small bowel loops compatible small bowel obstruction.  Initially they thought it was bilateral multifocal airspace disease but had not changed significantly.  1 view abdomen consistent with enteric tube with coursing below the hemidiaphragm sideport overlying the expected region of the distal esophagus.  Gaseous dilatation of the small bowel noted within the upper abdomen suggestive of a bowel obstruction.  He did note that abdomen was distended.  This is probably the reason for the vomiting.  Final Clinical Impression(s) / ED Diagnoses Final diagnoses:  None    Rx / DC Orders ED Discharge Orders     None         Fredia Sorrow, MD 07/13/2021 [phone]

## 2021-07-08 NOTE — Progress Notes (Signed)
EEG complete - results pending 

## 2021-07-09 ENCOUNTER — Inpatient Hospital Stay (HOSPITAL_COMMUNITY): Payer: Medicaid Other

## 2021-07-09 ENCOUNTER — Encounter (HOSPITAL_COMMUNITY): Payer: Self-pay | Admitting: General Surgery

## 2021-07-09 DIAGNOSIS — J80 Acute respiratory distress syndrome: Secondary | ICD-10-CM | POA: Diagnosis not present

## 2021-07-09 DIAGNOSIS — A419 Sepsis, unspecified organism: Secondary | ICD-10-CM | POA: Diagnosis not present

## 2021-07-09 DIAGNOSIS — J9601 Acute respiratory failure with hypoxia: Secondary | ICD-10-CM | POA: Diagnosis not present

## 2021-07-09 DIAGNOSIS — R6521 Severe sepsis with septic shock: Secondary | ICD-10-CM | POA: Diagnosis not present

## 2021-07-09 LAB — CBC WITH DIFFERENTIAL/PLATELET
Abs Immature Granulocytes: 0.41 10*3/uL — ABNORMAL HIGH (ref 0.00–0.07)
Basophils Absolute: 0 10*3/uL (ref 0.0–0.1)
Basophils Relative: 0 %
Eosinophils Absolute: 0.1 10*3/uL (ref 0.0–0.5)
Eosinophils Relative: 3 %
HCT: 33.6 % — ABNORMAL LOW (ref 36.0–46.0)
Hemoglobin: 10.7 g/dL — ABNORMAL LOW (ref 12.0–15.0)
Immature Granulocytes: 15 %
Lymphocytes Relative: 19 %
Lymphs Abs: 0.5 10*3/uL — ABNORMAL LOW (ref 0.7–4.0)
MCH: 30.1 pg (ref 26.0–34.0)
MCHC: 31.8 g/dL (ref 30.0–36.0)
MCV: 94.4 fL (ref 80.0–100.0)
Monocytes Absolute: 0.1 10*3/uL (ref 0.1–1.0)
Monocytes Relative: 5 %
Neutro Abs: 1.6 10*3/uL — ABNORMAL LOW (ref 1.7–7.7)
Neutrophils Relative %: 58 %
Platelets: 108 10*3/uL — ABNORMAL LOW (ref 150–400)
RBC: 3.56 MIL/uL — ABNORMAL LOW (ref 3.87–5.11)
RDW: 18.1 % — ABNORMAL HIGH (ref 11.5–15.5)
WBC: 2.7 10*3/uL — ABNORMAL LOW (ref 4.0–10.5)
nRBC: 0 % (ref 0.0–0.2)

## 2021-07-09 LAB — POCT I-STAT 7, (LYTES, BLD GAS, ICA,H+H)
Acid-Base Excess: 3 mmol/L — ABNORMAL HIGH (ref 0.0–2.0)
Acid-base deficit: 4 mmol/L — ABNORMAL HIGH (ref 0.0–2.0)
Bicarbonate: 22.2 mmol/L (ref 20.0–28.0)
Bicarbonate: 30.2 mmol/L — ABNORMAL HIGH (ref 20.0–28.0)
Calcium, Ion: 1.15 mmol/L (ref 1.15–1.40)
Calcium, Ion: 1.1 mmol/L — ABNORMAL LOW (ref 1.15–1.40)
HCT: 26 % — ABNORMAL LOW (ref 36.0–46.0)
HCT: 29 % — ABNORMAL LOW (ref 36.0–46.0)
Hemoglobin: 8.8 g/dL — ABNORMAL LOW (ref 12.0–15.0)
Hemoglobin: 9.9 g/dL — ABNORMAL LOW (ref 12.0–15.0)
O2 Saturation: 72 %
O2 Saturation: 88 %
Patient temperature: 39.3
Patient temperature: 40.4
Potassium: 4 mmol/L (ref 3.5–5.1)
Potassium: 3 mmol/L — ABNORMAL LOW (ref 3.5–5.1)
Sodium: 153 mmol/L — ABNORMAL HIGH (ref 135–145)
Sodium: 159 mmol/L — ABNORMAL HIGH (ref 135–145)
TCO2: 24 mmol/L (ref 22–32)
TCO2: 32 mmol/L (ref 22–32)
pCO2 arterial: 50.9 mmHg — ABNORMAL HIGH (ref 32–48)
pCO2 arterial: 72.6 mmHg (ref 32–48)
pH, Arterial: 7.26 — ABNORMAL LOW (ref 7.35–7.45)
pH, Arterial: 7.245 — ABNORMAL LOW (ref 7.35–7.45)
pO2, Arterial: 50 mmHg — ABNORMAL LOW (ref 83–108)
pO2, Arterial: 80 mmHg — ABNORMAL LOW (ref 83–108)

## 2021-07-09 LAB — GLUCOSE, CAPILLARY
Glucose-Capillary: 102 mg/dL — ABNORMAL HIGH (ref 70–99)
Glucose-Capillary: 129 mg/dL — ABNORMAL HIGH (ref 70–99)
Glucose-Capillary: 141 mg/dL — ABNORMAL HIGH (ref 70–99)
Glucose-Capillary: 141 mg/dL — ABNORMAL HIGH (ref 70–99)
Glucose-Capillary: 188 mg/dL — ABNORMAL HIGH (ref 70–99)
Glucose-Capillary: 83 mg/dL (ref 70–99)

## 2021-07-09 LAB — CBC
HCT: 32.2 % — ABNORMAL LOW (ref 36.0–46.0)
Hemoglobin: 10 g/dL — ABNORMAL LOW (ref 12.0–15.0)
MCH: 29.5 pg (ref 26.0–34.0)
MCHC: 31.1 g/dL (ref 30.0–36.0)
MCV: 95 fL (ref 80.0–100.0)
Platelets: 108 10*3/uL — ABNORMAL LOW (ref 150–400)
RBC: 3.39 MIL/uL — ABNORMAL LOW (ref 3.87–5.11)
RDW: 18.3 % — ABNORMAL HIGH (ref 11.5–15.5)
WBC: 2.9 10*3/uL — ABNORMAL LOW (ref 4.0–10.5)
nRBC: 0 % (ref 0.0–0.2)

## 2021-07-09 LAB — APTT
aPTT: 99 seconds — ABNORMAL HIGH (ref 24–36)
aPTT: 95 seconds — ABNORMAL HIGH (ref 24–36)

## 2021-07-09 LAB — BASIC METABOLIC PANEL
Anion gap: 20 — ABNORMAL HIGH (ref 5–15)
BUN: 42 mg/dL — ABNORMAL HIGH (ref 6–20)
CO2: 24 mmol/L (ref 22–32)
Calcium: 8.7 mg/dL — ABNORMAL LOW (ref 8.9–10.3)
Chloride: 115 mmol/L — ABNORMAL HIGH (ref 98–111)
Creatinine, Ser: 2.24 mg/dL — ABNORMAL HIGH (ref 0.44–1.00)
GFR, Estimated: 25 mL/min — ABNORMAL LOW (ref >60)
Glucose, Bld: 181 mg/dL — ABNORMAL HIGH (ref 70–99)
Potassium: 3.6 mmol/L (ref 3.5–5.1)
Sodium: 159 mmol/L — ABNORMAL HIGH (ref 135–145)

## 2021-07-09 LAB — VANCOMYCIN, RANDOM: Vancomycin Rm: 6 ug/mL

## 2021-07-09 LAB — HEPARIN LEVEL (UNFRACTIONATED): Heparin Unfractionated: >1.1 IU/mL — ABNORMAL HIGH (ref 0.30–0.70)

## 2021-07-09 LAB — LACTIC ACID, PLASMA: Lactic Acid, Venous: 8.8 mmol/L (ref 0.5–1.9)

## 2021-07-09 LAB — BRAIN NATRIURETIC PEPTIDE: B Natriuretic Peptide: 1919.2 pg/mL — ABNORMAL HIGH (ref 0.0–100.0)

## 2021-07-09 MED ORDER — SODIUM CHLORIDE 0.9 % IV SOLN
100.0000 mg | Freq: Every day | INTRAVENOUS | Status: DC
  Administered 2021-07-09 (×2): 100 mg via INTRAVENOUS
  Filled 2021-07-09 (×3): qty 5

## 2021-07-09 MED ORDER — ALBUMIN HUMAN 25 % IV SOLN
12.5000 g | Freq: Once | INTRAVENOUS | Status: AC
Start: 2021-07-09 — End: 2021-07-09
  Administered 2021-07-09: 12.5 g via INTRAVENOUS
  Filled 2021-07-09: qty 50

## 2021-07-09 MED ORDER — VANCOMYCIN VARIABLE DOSE PER UNSTABLE RENAL FUNCTION (PHARMACIST DOSING)
Status: DC
Start: 2021-07-09 — End: 2021-07-10

## 2021-07-09 MED ORDER — SODIUM CHLORIDE 0.9 % IV SOLN: INTRAVENOUS | Status: DC | PRN

## 2021-07-09 MED ORDER — ACETAMINOPHEN 10 MG/ML IV SOLN
1000.0000 mg | Freq: Four times a day (QID) | INTRAVENOUS | Status: DC | PRN
  Administered 2021-07-09 – 2021-07-10 (×3): 1000 mg via INTRAVENOUS
  Filled 2021-07-09 (×3): qty 100

## 2021-07-09 MED ORDER — SODIUM BICARBONATE 8.4 % IV SOLN
INTRAVENOUS | Status: AC
  Administered 2021-07-09: 50 meq
  Filled 2021-07-09: qty 50

## 2021-07-09 MED ORDER — VANCOMYCIN HCL IN DEXTROSE 1-5 GM/200ML-% IV SOLN
1000.0000 mg | Freq: Once | INTRAVENOUS | Status: AC
  Administered 2021-07-09: 1000 mg via INTRAVENOUS
  Filled 2021-07-09: qty 200

## 2021-07-09 MED ORDER — SODIUM BICARBONATE 8.4 % IV SOLN: 50.0000 meq | Freq: Once | INTRAVENOUS | Status: AC

## 2021-07-09 NOTE — Progress Notes (Addendum)
eLink Physician-Brief Progress Note Patient Name: Rachel Vang DOB: 12-Jul-1965 MRN: [id number]   Date of Service  07/09/2021  HPI/Events of Note  Notified of fever of 104 and continued need for 4 vasopressors, max on all of those. HR is 160 with this high fever. Is on D5LR, 4 pressors, fentanyl drip and heparin drip and on 100%/peep 15 on vent. Has oral tylenol ordered in system but is strict NPO.   eICU Interventions  Albumin x 1 IV tylenol PRN order placed ABG and LA ordered RN had already notified patient's family of her condition Poor prognosis.      Intervention Category Major Interventions: Shock - evaluation and management  Rachel Vang 07/09/2021, 8:48 PM  Addendum at 10:10 pm Notified of ABG and LA Already with RR at 32 and VT is at 7 cc/kg IBW in ARDS, maintaining Ph close to 7.25 and bicarb is 30 on the blood gas Getting the albumin Repeat LA at 1 am, will also do CMP and CBC at 1 am  Last LA was on June 10 and current level is lower than that, while in profound shock  Addendum at 6:20 am Notified of swollen left eye and crepitus over chest wall No change in O2 sat  Remains on 100%/peep 15 and on multiple pressors O2 sat is 96. Stat CXR was ordered by RN and being done now We will repeat another ABG as well. No change in left sided CVC , position of ETT as per RN. Seen on camera.   Addendum at 6:50 am Reviewed the CXR as well as ABG Has subcut air and pneumomediastinum but do not see an acute pneumothorax ABG noted We will try and lower the peep to 12 to see how she does Remains on extremely high dose pressors and do not think much else if left to offer Son has been updated overnight of her condition  I called and spoke with Dr Merrily Pew, incoming PCCM MD and updated him of these events as well

## 2021-07-09 NOTE — Progress Notes (Addendum)
Pharmacy Antibiotic Note  Vanc had been d/c'd during the day after blood cx was growing Proteus, now to resume vancomycin given hypotension and worsening CXR since prior.  Pt still covered with the dose given 24h ago; will get random vanc level with am labs.  Thank you for allowing pharmacy to be a part of this patient's care.  Vernard Gambles, PharmD, BCPS  07/09/2021 12:21 AM   Addendum: Random vanc level low at 6.  Will give vancomycin 1000mg  x1 and continue to monitor.  VB 6:49 AM

## 2021-07-09 NOTE — Progress Notes (Signed)
ANTICOAGULATION CONSULT NOTE  Pharmacy Consult for heparin Indication: History of PE/DVT  No Known Allergies  Vital Signs: Temp: 102.6 F (39.2 C) (06/11 1500) Temp Source: Bladder (06/11 1200) BP: 82/66 (06/11 1500) Pulse Rate: 67 (06/11 1500)  Labs: Recent Labs    07/22/2021 2305 08-03-21 0150 2021/08/03 0411 Aug 03, 2021 0455 03-Aug-2021 0739 08-03-2021 0921 August 03, 2021 1327 08-03-2021 1402 08-03-2021 2355 07/09/21 0012 07/09/21 0037 07/09/21 0434 07/09/21 1529  HGB 12.4   < >  --    < > 9.9*  9.5*  --  8.7*   < > 10.2* 10.7*  --  10.0*  --   HCT 40.7   < >  --    < > 29.0*  28.0*  --  27.4*   < > 30.0* 33.6*  --  32.2*  --   PLT 234  --   --    < >  --   --  125*  --   --  108*  --  108*  --   APTT 36  --   --   --   --  98*  --   --   --   --   --  99* 95*  LABPROT 27.6*  --   --   --   --   --   --   --   --   --   --   --   --   INR 2.6*  --   --   --   --   --   --   --   --   --   --   --   --   HEPARINUNFRC  --   --   --   --   --  >1.10*  --   --   --   --   --  >1.10*  --   CREATININE 5.30*   < > 3.83*  --  3.20*  --   --   --   --   --  2.24*  --   --    < > = values in this interval not displayed.    Assessment: 56yo female found unresponsive at NH, now intubated with concern for sepsis with aspiration noted by EDP.  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  Patient is s/p ex-lap with LOA and application of wound vac on 6/10.  She did not undergo a resection and abdomen is left open.  Pharmacy asked to resume IV heparin.  Surgery expects some oozing from the mesentery.  aPTT therapeutic at 95 sec; no bleeding reported.  Goal of Therapy:  Heparin level 0.3-0.7 units/ml aPTT 66-102 seconds Monitor platelets by anticoagulation protocol: Yes   Plan:  Reduce heparin infusion slightly to 800 units/hr Monitor heparin levels, aPTT (while Eliquis affects anti-Xa), and CBC  Zabian Swayne D. Laney Potash, PharmD, BCPS, BCCCP 07/09/2021, 4:40 PM

## 2021-07-09 NOTE — Progress Notes (Signed)
NAME:  Rachel Vang, MRN:  [id number], DOB:  1966-01-09, LOS: 2 ADMISSION DATE:  07/20/2021, CONSULTATION DATE: 07/03/2021 REFERRING MD:  Wyvonna Plum, CHIEF COMPLAINT: Acute respiratory failure  History of Present Illness:  Rachel Vang is a 56 year old woman with a history of SAH who is bedbound at baseline and mostly dependent on PEG tube who presents after being found unresponsive at [facility] skilled nursing facility.  EMS was unable to intubate her despite severe hypoxia due to poor mouth opening.  She required cardioversion for tachycardia with heart rates in the 180s.  In the ED she was intubated after RSI medications were given, although she was a difficult airway.  She had dark brown thin liquid suctioned from her NG tube and from her airway.  She remains tachycardic, hypotensive, tachypneic.  She was started on peripheral norepinephrine for hypotension.  Her son indicated that at baseline she is bedbound, but is able to communicate, uses her tablet, and frequently watches TV.  She sometimes eats, but still has a PEG tube.  She has a history of previous tracheostomy.  Previously been on apixaban for history of pulmonary embolism.  She is a chronic Foley catheter since her most recent hospital admission several months ago.  She has a history of GU bleeding, LABA as recent as a month ago that had been attributed to her chronic anticoagulation.  Her son says this is resolved.  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; trach during that admission> decannulated since PE, DVT on chronic anticoagulation GERD Epilepsy Diabetes  Significant Hospital Events: Including procedures, antibiotic start and stop dates in addition to other pertinent events   6/9 admission to ICU 6/10 underwent laparotomy for bowel obstruction  Interim History / Subjective:  Patient remained hypotensive, requiring high-dose vasopressors, currently on Levophed, vasopressin,  epinephrine and phenylephrine Currently on Levophed 50 mics and vasopressin ABGs consistent with severe ARDS with P/F ratio 50 Started spiking fever, remained tachycardic  Objective   Blood pressure (!) 134/105, pulse 62, temperature (!) 100.9 F (38.3 C), resp. rate (!) 30, height  (1.727 m), weight 67.2 kg, SpO2 (!) 87 %. CVP:  [10 mmHg] 10 mmHg  Vent Mode: PRVC FiO2 (%):  [100 %] 100 % Set Rate:  [30 bmp] 30 bmp Vt Set:  [510 mL] 510 mL PEEP:  [12 cmH20-15 cmH20] 15 cmH20 Plateau Pressure:  [23 cmH20-32 cmH20] 32 cmH20   Intake/Output Summary (Last 24 hours) at 07/09/2021 0759 Last data filed at 07/09/2021 0700 Gross per 24 hour  Intake 7467.53 ml  Output 6825 ml  Net 642.53 ml   Filed Weights   07/21/2021 0045 07/25/2021 0340 07/09/21 0500  Weight: 67.2 kg 67.2 kg 67.2 kg    Examination: General: Critically ill-appearing woman lying in bed intubated HENT: [place]/AT, eyes anicteric. Poor dentition, dental carries. Brown thin liquid in mouth. Lungs: Coarse rales bilaterally,  thin brown secretions via ETT.   Cardiovascular: Tachycardic in the 150s, regular rhythm Abdomen: Soft, distended.  PEG tube.  Surgical wound looks clean, wound VAC in place Extremities: No peripheral edema, minimal muscle mass Neuro: Unresponsive, pupils reactive, no significant gag reflex.  Breathing over the vent. GU: Foley with blood-tinged pink urine Skin: Mottled all over  ABG    Component Value Date/Time   PHART 7.326 (L) 07/14/2021 2355   PCO2ART 47.7 07/25/2021 2355   PO2ART 50 (L) 07/12/2021 2355   HCO3 24.9 07/09/2021 2355   TCO2 26 07/11/2021 2355  ACIDBASEDEF 1.0 07/24/2021 2355   O2SAT 82 07/02/2021 2355    Resolved Hospital Problem list     Assessment & Plan:  Acute respiratory failure with hypoxia due to aspiration pneumonia bilaterally Severe ARDS Previous tracheostomy, now decannulated Continue lung protective ventilation Her P/F ratio is 50, but unable to hold her  considering she is unstable with 4 pressors and with open abdomen PAD protocol for sedation; fentanyl and Versed infusion at low-dose  VAP prevention protocol Follow-up respiratory culture Continue IV Zosyn, vancomycin and micafungin MRSA swab is positive  Severe sepsis with septic shock due to aspiration pneumonia  Proteus bacteremia Lactic acidosis Patient is on high-dose vasopressors including norepinephrine, epinephrine, phenylephrine and vasopressin Continue with map goal 65 Continue broad-spectrum IV antibiotics Blood culture grew Proteus, sensitive results are pending Trend lactate  Small bowel obstruction s/p laparotomy with lysis of adhesions and wound VAC placement CT abdomen is suggestive of bowel obstruction, lactate is elevated, patient has distended abdomen Patient underwent laparotomy with wound VAC placement and lysis of adhesions Her abdomen is open currently General surgery is following, appreciate their recommendations  AKI, due to septic ATN Hypernatremia Continue IV fluid resuscitation with D5 LR Serum creatinine started trending down Serum sodium continue to rise Monitor intake and output Renal ultrasound showed mild hydronephrosis Foley catheter was replaced Avoid nephrotoxic agents  Acute septic/metabolic encephalopathy In the setting of sepsis, hypoxia and metabolic acidosis Avoid sedation CT head was negative for acute findings EEG showed diffuse encephalopathy  History of previous subarachnoid hemorrhage with residual right hemiparesis Bedbound at baseline.  Hypocalcemia Continue aggressive electrolyte supplement  Diabetes type II Continue SSI as needed Goal BG 140-180  History of DVT and PE Hold Eliquis Continue IV heparin infusion  Best Practice (right click and "Reselect all SmartList Selections" daily)   Diet/type: NPO DVT prophylaxis: systemic heparin GI prophylaxis: PPI Lines: Central line Foley:  Yes, and it is still  needed Code Status:  full code Last date of multidisciplinary goals of care discussion [sons updated on 6/11]  Labs   CBC: Recent Labs  Lab 07/31/2021 2241 July 31, 2021 2243 2021-07-31 2305 07/26/2021 0150 07/17/2021 0455 07/17/2021 0536 07/25/2021 1327 07/14/2021 1402 07/12/2021 1822 07/06/2021 2249 07/01/2021 2355 07/09/21 0012 07/09/21 0434  WBC 8.9  --  6.3  --  1.0*  --  1.7*  --   --   --   --  2.7* 2.9*  NEUTROABS 6.8  --   --   --   --   --   --   --   --   --   --  1.6*  --   HGB 12.0   < > 12.4   < > 9.6*   < > 8.7*   < > 10.2* 8.8* 10.2* 10.7* 10.0*  HCT 41.0   < > 40.7   < > 29.8*   < > 27.4*   < > 30.0* 26.0* 30.0* 33.6* 32.2*  MCV 104.9*  --  102.5*  --  96.1  --  95.8  --   --   --   --  94.4 95.0  PLT 225  --  234  --  136*  --  125*  --   --   --   --  108* 108*   < > = values in this interval not displayed.    Basic Metabolic Panel: Recent Labs  Lab 2021-07-31 2241 31-Jul-2021 2243 2021-07-31 2305 07/26/2021 0150 07/18/2021 6283 07/24/2021 0411 07/13/2021 0536 07/24/2021 0739 07/09/2021  1402 02-Aug-2021 1507 2021/08/02 1822 Aug 02, 2021 2249 08/02/21 2355 07/09/21 0037  NA 152*   < >  --    < > 144 146*   < > 146*  146*   < > 153* 154* 159* 158* 159*  K 5.8*   < >  --    < > 4.2 4.1   < > 4.3  4.5   < > 4.0 3.9 3.7 3.6 3.6  CL 117*  --   --   --  111 111  --  111  --   --   --   --   --  115*  CO2 13*  --   --   --  21* 22  --   --   --   --   --   --   --  24  GLUCOSE 213*  --   --   --  184* 144*  --  101*  --   --   --   --   --  181*  BUN 82*  --   --   --  68* 67*  --  74*  --   --   --   --   --  42*  CREATININE 5.44*  --  5.30*  --  3.99* 3.83*  --  3.20*  --   --   --   --   --  2.24*  CALCIUM 9.0  --   --   --  8.0* 8.3*  --   --   --   --   --   --   --  8.7*  MG  --   --   --   --   --  1.6*  --   --   --   --   --   --   --   --   PHOS  --   --   --   --   --  3.8  --   --   --   --   --   --   --   --    < > = values in this interval not displayed.   GFR: Estimated Creatinine  Clearance: 27.6 mL/min (A) (by C-G formula based on SCr of 2.24 mg/dL (H)). Recent Labs  Lab 07/27/2021 2305 Aug 02, 2021 0122 08-02-21 0352 08-02-2021 0455 Aug 02, 2021 0921 2021-08-02 1327 07/09/21 0012 07/09/21 0434  WBC 6.3  --   --  1.0*  --  1.7* 2.7* 2.9*  LATICACIDVEN >9.0* 7.0* 6.9*  --  >9.0*  --   --   --     Liver Function Tests: Recent Labs  Lab 07/16/2021 2241  AST 43*  ALT 37  ALKPHOS 61  BILITOT 0.6  PROT 6.7  ALBUMIN 2.6*   No results for input(s): "LIPASE", "AMYLASE" in the last 168 hours. No results for input(s): "AMMONIA" in the last 168 hours.  ABG    Component Value Date/Time   PHART 7.326 (L) 08-02-2021 2355   PCO2ART 47.7 08-02-21 2355   PO2ART 50 (L) 08-02-21 2355   HCO3 24.9 08/02/2021 2355   TCO2 26 02-Aug-2021 2355   ACIDBASEDEF 1.0 02-Aug-2021 2355   O2SAT 82 02-Aug-2021 2355     Coagulation Profile: Recent Labs  Lab 06/30/2021 2305  INR 2.6*    Cardiac Enzymes: No results for input(s): "CKTOTAL", "CKMB", "CKMBINDEX", "TROPONINI" in the last 168 hours.  HbA1C: Hemoglobin A1C  Date/Time Value Ref Range Status  05/29/2017  12:00 AM 6.1  Final  01/25/2017 12:00 AM 6.1  Final   Hgb A1c MFr Bld  Date/Time Value Ref Range Status  12/12/2020 04:06 AM 6.0 (H) 4.8 - 5.6 % Final    Comment:    (NOTE)         Prediabetes: 5.7 - 6.4         Diabetes: >6.4         Glycemic control for adults with diabetes: <7.0   04/05/2020 10:12 AM 5.8 (H) 4.8 - 5.6 % Final    Comment:    (NOTE) Pre diabetes:          5.7%-6.4%  Diabetes:              >6.4%  Glycemic control for   <7.0% adults with diabetes     CBG: Recent Labs  Lab 23-Dec-2021 1327 23-Dec-2021 1814 23-Dec-2021 1942 23-Dec-2021 2328 07/09/21 0320  GLUCAP 78 148* 156* 153* 141*    Critical care time:     Total critical care time: 41 minutes  Performed by: Cheri FowlerSudham Irbin Fines   Critical care time was exclusive of separately billable procedures and treating other patients.   Critical care was  necessary to treat or prevent imminent or life-threatening deterioration.   Critical care was time spent personally by me on the following activities: development of treatment plan with patient and/or surrogate as well as nursing, discussions with consultants, evaluation of patient's response to treatment, examination of patient, obtaining history from patient or surrogate, ordering and performing treatments and interventions, ordering and review of laboratory studies, ordering and review of radiographic studies, pulse oximetry and re-evaluation of patient's condition.   Cheri FowlerSudham Moxie Kalil, MD [facility] See Amion for pager If no response to pager, please call [phone] until 7pm After 7pm, Please call E-link [phone]

## 2021-07-09 NOTE — Progress Notes (Signed)
eLink Physician-Brief Progress Note Patient Name: Rachel Vang DOB: 04/29/1965 MRN: [id number]   Date of Service  07/09/2021  HPI/Events of Note  Patient with 50 ml of purulent fluid in the abdomen when it was opened up in the OR, although growing gram negative rods in the blood on Zosyn, she remains in profound shock with neutropenia (1.0 -> 1.7).  eICU Interventions  Antibiotic coverage broadened empirically with continuation of Vancomycin, and addition of Micafungin for now (for intra-abdominal sepsis and aspiration pneumonia).        Kerry Kass Azoria Abbett 07/09/2021, 12:29 AM

## 2021-07-09 NOTE — Progress Notes (Signed)
Patient ID: Rachel Vang, female   DOB: Dec 14, 1965, 56 y.o.   MRN: [id number] [facility] Surgery Progress Note  1 Day Post-Op  Subjective: CC-  Aunt at bedside. Febrile and tachy. Remains hypotensive on multiple pressors (Levophed, vasopressin, epinephrine and phenylephrine). Patient has had a couple Bms since surgery. Continues to have dark brown output from PEG tube (120cc recorded plus 200cc in bag). Heparin gtt started around 2000 last night  Objective: Vital signs in last 24 hours: Temp:  [93.9 F (34.4 C)-102.7 F (39.3 C)] 100.9 F (38.3 C) (06/11 0700) Pulse Rate:  [57-183] 152 (06/11 0808) Resp:  [19-45] 32 (06/11 0808) BP: (55-135)/(17-107) 134/105 (06/11 0700) SpO2:  [73 %-88 %] 87 % (06/11 0808) Arterial Line BP: (75-253)/(28-84) 88/58 (06/11 0700) FiO2 (%):  [100 %] 100 % (06/11 0808) Weight:  [67.2 kg] 67.2 kg (06/11 0500) Last BM Date : 07/09/21  Intake/Output from previous day: 06/10 0701 - 06/11 0700 In: 7467.5 [I.V.:5080.9; Blood:315; IV Piggyback:1906.6] Out: 4098 [JXBJY:7829; Drains:320; Blood:30] Intake/Output this shift: No intake/output data recorded.  PE: Gen:  sedated on the vent HEENT: ETT in place Card:  tachy 140s Pulm:  mechanically ventilated Abd: distended but soft, PEG to gravity drainage with dark brown fluid draining, vac with good seal and small amount of serosanguinous fluid in cannister GU: foley with clear yellow/ blood tinged urine  Lab Results:  Recent Labs    07/09/21 0012 07/09/21 0434  WBC 2.7* 2.9*  HGB 10.7* 10.0*  HCT 33.6* 32.2*  PLT 108* 108*   BMET Recent Labs    07/06/2021 0411 07/18/2021 0536 07/27/2021 0739 07/03/2021 1402 07/24/2021 2355 07/09/21 0037  NA 146*   < > 146*  146*   < > 158* 159*  K 4.1   < > 4.3  4.5   < > 3.6 3.6  CL 111  --  111  --   --  115*  CO2 22  --   --   --   --  24  GLUCOSE 144*  --  101*  --   --  181*  BUN 67*  --  74*  --   --  42*  CREATININE 3.83*  --  3.20*  --   --   2.24*  CALCIUM 8.3*  --   --   --   --  8.7*   < > = values in this interval not displayed.   PT/INR Recent Labs    July 14, 2021 2305  LABPROT 27.6*  INR 2.6*   CMP     Component Value Date/Time   NA 159 (H) 07/09/2021 0037   NA 138 06/28/2020 1230   K 3.6 07/09/2021 0037   CL 115 (H) 07/09/2021 0037   CO2 24 07/09/2021 0037   GLUCOSE 181 (H) 07/09/2021 0037   BUN 42 (H) 07/09/2021 0037   BUN 21 06/28/2020 1230   CREATININE 2.24 (H) 07/09/2021 0037   CALCIUM 8.7 (L) 07/09/2021 0037   PROT 6.7 2021-07-14 2241   ALBUMIN 2.6 (L) 14-Jul-2021 2241   AST 43 (H) 07/14/2021 2241   ALT 37 07-14-21 2241   ALKPHOS 61 2021-07-14 2241   BILITOT 0.6 07/14/21 2241   GFRNONAA 25 (L) 07/09/2021 0037   GFRAA >60 09/08/2017 0520   Lipase     Component Value Date/Time   LIPASE 62 (H) 12/11/2020 2254       Studies/Results: DG Chest Port 1 View  Result Date: 07/16/2021 CLINICAL DATA:  Acute respiratory failure, hypoxia EXAM:  PORTABLE CHEST 1 VIEW COMPARISON:  07-18-2021 FINDINGS: Patchy bilateral airspace disease again noted, worsening in the right lower lobe since prior study, stable otherwise. Heart is normal size. No effusions or pneumothorax. Endotracheal tube, left central line remain in place, unchanged. IMPRESSION: Bilateral patchy airspace disease, worsening on the right since prior study. Electronically Signed   By: Charlett NoseKevin  Dover M.D.   On: 07-18-2021 23:14   DG CHEST PORT 1 VIEW  Result Date: 22-Nov-2021 CLINICAL DATA:  Respiratory failure EXAM: PORTABLE CHEST 1 VIEW COMPARISON:  07/02/2021 FINDINGS: Worsening patchy bilateral airspace disease concerning for pneumonia. Heart is normal size. Endotracheal tube and left central line remain in place, unchanged. No visible effusions or pneumothorax. IMPRESSION: Worsening patchy bilateral airspace disease concerning for pneumonia. Electronically Signed   By: Charlett NoseKevin  Dover M.D.   On: 07-18-2021 19:32   CT ABDOMEN PELVIS WO  CONTRAST  Result Date: 22-Nov-2021 CLINICAL DATA:  Bowel obstruction suspected.  Sepsis. EXAM: CT ABDOMEN AND PELVIS WITHOUT CONTRAST TECHNIQUE: Multidetector CT imaging of the abdomen and pelvis was performed following the standard protocol without IV contrast. RADIATION DOSE REDUCTION: This exam was performed according to the departmental dose-optimization program which includes automated exposure control, adjustment of the mA and/or kV according to patient size and/or use of iterative reconstruction technique. COMPARISON:  Earlier the same day FINDINGS: Lower chest: Extensive bilateral pneumonia with progression from before. Extensive coronary atherosclerosis. Hepatobiliary: No focal liver abnormality.No evidence of biliary obstruction or stone. Pancreas: Generalized atrophy Spleen: Unremarkable. Adrenals/Urinary Tract: Negative adrenals. Improvement in bilateral hydronephrosis after bladder decompression. Stomach/Bowel: Persisting small bowel dilatation above a transition point where there is twisting of the due to twist in the small bowel mesentery. A loop just before the twist appears more thick walled and dense both at the level of the bowel wall in luminal contents. No perforation at this time. Located peg. The stomach has been partially decompressed. Vascular/Lymphatic: Atherosclerosis affecting the aorta, iliacs, and mesenteric branches. No mass or adenopathy. Reproductive:Hysterectomy Other: Retroperitoneal edema which may be from third spacing in this septic patient. Musculoskeletal: No acute abnormalities. Case reviewed with Dr. Andrey CampanileWilson via telephone IMPRESSION: 1. Continued small bowel obstruction with underlying mesenteric twisting/volvulus appearance. The loop before the transition point is becoming thicker, possible sign of vessel compromise. No pneumatosis or perforation. 2. Worsening of widespread pneumonia. 3. Advanced atherosclerosis. 4. Decompression of the urinary bladder with improved  hydronephrosis. Electronically Signed   By: Tiburcio PeaJonathan  Watts M.D.   On: 07-18-2021 12:55   US RENAL  Result Date: 22-Nov-2021 CLINICAL DATA:  Acute kidney injury. EXAM: RENAL / URINARY TRACT ULTRASOUND COMPLETE COMPARISON:  CT 07-18-2021 FINDINGS: Right Kidney: Renal measurements: 12.1 x 5.4 x 5.4 cm = volume: 184.7 ML. Echogenicity within normal limits. No mass or hydronephrosis visualized. Left Kidney: Renal measurements: 12 x 5.3 x 5.5 cm = volume: 180.3 mL. There is mild hydronephrosis. Normal parenchymal echogenicity. No mass identified. Bladder: Decompressed around a Foley catheter. Other: None. IMPRESSION: 1. Mild left-sided hydronephrosis. 2. Normal appearance of the right kidney. Electronically Signed   By: Signa Kellaylor  Stroud M.D.   On: 07-18-2021 08:48   EEG adult  Result Date: 22-Nov-2021 Charlsie QuestYadav, Priyanka O, MD     22-Nov-2021  2:03 AM Patient Name: Lucienne MinksStephanie N Penick MRN: [id number] Epilepsy Attending: Charlsie QuestPriyanka O Yadav Referring Physician/Provider: Steffanie Dunnlark, Laura P, DO Date: 22-Nov-2021 Duration: 21.10 mins Patient history: 56yo F with ams. EEG to evaluate for seizure Level of alertness:  lethargic AEDs during EEG study: LEV, GBP Technical aspects:  This EEG study was done with scalp electrodes positioned according to the 10-20 International system of electrode placement. Electrical activity was acquired at a sampling rate of 500Hz  and reviewed with a high frequency filter of 70Hz  and a low frequency filter of 1Hz . EEG data were recorded continuously and digitally stored. Description: EEG showed continuous generalized 3 to 6 Hz theta-delta slowing. Hyperventilation and photic stimulation were not performed.   Of note, study was technically difficult due to ventilator artifact seen throughout the study ABNORMALITY - Continuous slow, generalized IMPRESSION: This technically difficult study is suggestive of severe diffuse encephalopathy, nonspecific etiology. No seizures or epileptiform discharges were seen  throughout the recording. Priyanka   CT ABDOMEN PELVIS WO CONTRAST  Result Date: 07/04/2021 CLINICAL DATA:  Found unresponsive.  Bowel obstruction suspected EXAM: CT ABDOMEN AND PELVIS WITHOUT CONTRAST TECHNIQUE: Multidetector CT imaging of the abdomen and pelvis was performed following the standard protocol without IV contrast. RADIATION DOSE REDUCTION: This exam was performed according to the departmental dose-optimization program which includes automated exposure control, adjustment of the mA and/or kV according to patient size and/or use of iterative reconstruction technique. COMPARISON:  12/12/2020 FINDINGS: Lower chest: Airspace opacities in both lower lobes concerning for pneumonia. Aspiration possible. Heart is normal size. No effusions. Hepatobiliary: No focal hepatic abnormality. Gallbladder unremarkable. Pancreas: No focal abnormality or ductal dilatation. Spleen: No focal abnormality.  Normal size. Adrenals/Urinary Tract: Moderate bilateral hydronephrosis. Possible layering bladder stones posteriorly. No visible ureteral stones. Urinary bladder moderately distended. Foley catheter balloon appears to be located inferior to the bladder, possibly within the urethra. Recommend repositioning. Adrenal glands unremarkable. Stomach/Bowel: Gastrostomy tube and NG tube are in the stomach. Large bowel is decompressed. There is gaseous distention of stomach and small bowel loops with air-fluid levels. Findings concerning for distal small bowel obstruction. Swirled appearance of the mesenteric vessels in the lower abdomen and upper pelvis raises possibility of volvulus or internal hernia. Vascular/Lymphatic: Aortic atherosclerosis. No evidence of aneurysm or adenopathy. Reproductive: Prior hysterectomy.  No adnexal masses. Other: No free fluid or free air. Musculoskeletal: No acute bony abnormality. IMPRESSION: Dilated stomach and small bowel with air-fluid levels. Findings compatible with distal small  bowel obstruction. Swirled appearance of bowel and mesenteric vessels in the lower abdomen and upper pelvis raise the possibility of volvulus or internal hernia. Airspace opacities in both lower lobes most compatible with pneumonia, possibly aspiration related. Bilateral hydronephrosis to the level of the bladder. Bladder is distended. Foley catheter balloon appears to be below the bladder, likely within the urethra. Recommend repositioning. Aortic atherosclerosis. Electronically Signed   By: Annabelle Harman M.D.   On: 07/09/2021 00:50   CT HEAD WO CONTRAST (12/14/2020)  Result Date: 07/24/2021 CLINICAL DATA:  Altered mental status, nontraumatic. Found unresponsive EXAM: CT HEAD WITHOUT CONTRAST TECHNIQUE: Contiguous axial images were obtained from the base of the skull through the vertex without intravenous contrast. RADIATION DOSE REDUCTION: This exam was performed according to the departmental dose-optimization program which includes automated exposure control, adjustment of the mA and/or kV according to patient size and/or use of iterative reconstruction technique. COMPARISON:  02/04/2015 FINDINGS: Brain: Encephalomalacia within the medial frontal lobes and bilateral sylvian fissures, left anterior temporal lobe. Extensive chronic small vessel disease throughout the deep white matter. No hydrocephalus, acute infarction or hemorrhage. Vascular: A-comm aneurysm coils noted, stable. No hyperdense vessel. Skull: No acute calvarial abnormality. 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 Extensive chronic small vessel disease. No acute intracranial abnormality. Electronically Signed   By: Charlett Nose M.D.   On: 07/18/2021 00:44   DG Abd 1 View  Result Date: 07/13/2021 CLINICAL DATA:  Vomiting EXAM: ABDOMEN - 1 VIEW COMPARISON:  12/12/2020 FINDINGS: Gastrostomy tube projects  over the midline. OG tube tip in the midline, likely within the proximal stomach. Diffuse gaseous distention of bowel. This appears to be both large and small bowel, favor ileus. No visible free air. IMPRESSION: Diffuse gaseous distention of bowel, favor ileus. Electronically Signed   By: Charlett Nose M.D.   On: 07/21/2021 00:00   DG Chest Portable 1 View  Result Date: July 28, 2021 CLINICAL DATA:  Central line placement. EXAM: PORTABLE CHEST 1 VIEW COMPARISON:  Chest x-ray 07/28/2021. FINDINGS: Endotracheal tube tip is 1.8 cm above the carina. There is a new enteric tube with distal tip in the proximal stomach just below the gastroesophageal junction. Left-sided central venous catheter tip projects over the SVC. The patient is rotated. Cardiomediastinal silhouette is grossly unchanged. Bilateral multifocal airspace disease has not significantly changed. No pleural effusion or pneumothorax identified. Osseous structures are stable. Dilated small bowel loops in the upper abdomen persists. IMPRESSION: 1. Nasogastric tube in the proximal stomach just beyond the gastroesophageal junction. Recommend advancing tube. 2. Central venous catheter tip projects over the SVC. 3. Stable bilateral airspace disease. 4. Stable dilated small bowel loops compatible small bowel obstruction. Electronically Signed   By: Darliss Cheney M.D.   On: 07/28/21 23:46   DG Chest Portable 1 View  Result Date: 07-28-2021 CLINICAL DATA:  post intubation EXAM: PORTABLE CHEST 1 VIEW.  Patient is rotated. COMPARISON:  CT abdomen pelvis 12/12/2020 FINDINGS: Endotracheal tube with tip terminating 2.5 cm above the carina. Enteric tube with tip coursing below the hemidiaphragm and side port overlying the expected region of the distal esophagus. The heart and mediastinal contours are unchanged. Low lung volumes. Query left lower lobe patchy airspace opacity. No pulmonary edema. No pleural effusion. No pneumothorax. No acute osseous abnormality. Gaseous  dilatation of the small bowel noted within the upper abdomen. IMPRESSION: 1. Enteric tube with tip coursing below the hemidiaphragm and side port overlying the expected region of the distal esophagus. Recommend advancing by 10 cm. 2. Low lung volumes with query left lower lobe patchy airspace opacity. Recommend PA and lateral view of the chest. 3. Gaseous dilatation of the small bowel noted within the upper abdomen suggestive of obstruction. Recommend CT abdomen pelvis with intravenous contrast. Electronically Signed   By: Tish Frederickson M.D.   On: Jul 28, 2021 22:29    Anti-infectives: Anti-infectives (From admission, onward)    Start     Dose/Rate Route Frequency Ordered Stop   07/09/21 0745  vancomycin (VANCOCIN) IVPB 1000 mg/200 mL premix        1,000 mg 200 mL/hr over 60 Minutes Intravenous  Once 07/09/21 0650     07/09/21 0045  micafungin (MYCAMINE) 100 mg in sodium chloride 0.9 % 100 mL IVPB       Note to Pharmacy: Pharmacy may modify dose for renal dysfunction.   100 mg 105 mL/hr over 1 Hours Intravenous Daily at 10 pm 07/09/21 0026 07/15/21 2159   07/09/21 0019  vancomycin variable dose per unstable renal function (pharmacist dosing)         Does not apply See admin instructions 07/09/21 0020     07/26/2021 2200  piperacillin-tazobactam (ZOSYN) IVPB 3.375 g  3.375 g 12.5 mL/hr over 240 Minutes Intravenous Every 8 hours 07/20/2021 1215     07/23/2021 1000  piperacillin-tazobactam (ZOSYN) IVPB 3.375 g  Status:  Discontinued        3.375 g 12.5 mL/hr over 240 Minutes Intravenous Every 12 hours 07/23/2021 0101 06/30/2021 1215   07/06/2021 0101  vancomycin variable dose per unstable renal function (pharmacist dosing)  Status:  Discontinued         Does not apply See admin instructions 07/18/2021 0101 07/06/2021 1750   07/26/2021 2300  piperacillin-tazobactam (ZOSYN) IVPB 3.375 g        3.375 g 100 mL/hr over 30 Minutes Intravenous  Once 07/11/2021 2253 07/25/2021 2336   07/13/2021 2230  ceFEPIme  (MAXIPIME) 2 g in sodium chloride 0.9 % 100 mL IVPB  Status:  Discontinued        2 g 200 mL/hr over 30 Minutes Intravenous  Once 07/05/2021 2215 07/19/2021 2253   07/06/2021 2230  metroNIDAZOLE (FLAGYL) IVPB 500 mg  Status:  Discontinued        500 mg 100 mL/hr over 60 Minutes Intravenous  Once 07/13/2021 2215 07/14/2021 2253   07/14/2021 2230  vancomycin (VANCOCIN) IVPB 1000 mg/200 mL premix        1,000 mg 200 mL/hr over 60 Minutes Intravenous  Once 07/26/2021 2215 07/25/2021 0045        Assessment/Plan Septic shock SBO/ closed loop obstruction POD#1 s/p ex lap, lysis of adhesions, application Abthera wound vac 6/10 Dr. Andrey Campanile - Continue PEG to gravity - ARDS/ vent management and resuscitation per CCM - continue IV antibiotics - continue Abthera wound vac. 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

## 2021-07-09 NOTE — Progress Notes (Signed)
MD notified of fever, decreased blood pressure at 67/53, increasing heart rate to 163. MD returned contact to RN via phone. No new orders at this time.

## 2021-07-09 NOTE — Progress Notes (Signed)
ANTICOAGULATION CONSULT NOTE  Pharmacy Consult for heparin Indication: History of PE/DVT  No Known Allergies  Vital Signs: Temp: 101.1 F (38.4 C) (06/11 0615) Temp Source: Bladder (06/11 0400) BP: 133/89 (06/11 0600) Pulse Rate: 60 (06/11 0615)  Labs: Recent Labs    07-25-2021 2305 07/01/2021 0150 07/17/2021 0411 07/11/2021 0455 07/26/2021 0739 07/03/2021 0921 07/04/2021 1327 07/24/2021 1402 07/14/2021 2355 07/09/21 0012 07/09/21 0037 07/09/21 0434  HGB 12.4   < >  --    < > 9.9*  9.5*  --  8.7*   < > 10.2* 10.7*  --  10.0*  HCT 40.7   < >  --    < > 29.0*  28.0*  --  27.4*   < > 30.0* 33.6*  --  32.2*  PLT 234  --   --    < >  --   --  125*  --   --  108*  --  108*  APTT 36  --   --   --   --  98*  --   --   --   --   --  99*  LABPROT 27.6*  --   --   --   --   --   --   --   --   --   --   --   INR 2.6*  --   --   --   --   --   --   --   --   --   --   --   HEPARINUNFRC  --   --   --   --   --  >1.10*  --   --   --   --   --  >1.10*  CREATININE 5.30*   < > 3.83*  --  3.20*  --   --   --   --   --  2.24*  --    < > = values in this interval not displayed.    Assessment: 56yo female found unresponsive at NH, now intubated with concern for sepsis with aspiration noted by EDP.  Pt is on Eliquis PTA (last dose 6/9 am) for history of VTE and Pharmacy consulted to dose IV heparin.  Pt w/ AKI, SCr baseline <1, now 5.  Patient is s/p ex-lap with LOA and application of wound vac on 6/10.  She did not undergo a resection and abdomen is left open.  Pharmacy asked to resume IV heparin this evening without a bolus.  Patient was previously therapeutic on 900 units/hr.  Surgery expects some oozing from the mesentery.  aPTT 99 and Heparin level >1.1 so not correlating at this time; per RN no excessive bleeding or infusion issues; CBC stable  Goal of Therapy:  Heparin level 0.3-0.7 units/ml aPTT 66-102 seconds Monitor platelets by anticoagulation protocol: Yes   Plan:  Continue heparin  infusion at 850 units/hr post surgery Monitor heparin levels, aPTT (while Eliquis affects anti-Xa), and CBC  Ruben Im, PharmD Clinical Pharmacist 07/09/2021 6:31 AM Please check AMION for all [facility] numbers

## 2021-07-10 ENCOUNTER — Inpatient Hospital Stay (HOSPITAL_COMMUNITY): Payer: Medicaid Other

## 2021-07-10 DIAGNOSIS — A419 Sepsis, unspecified organism: Secondary | ICD-10-CM | POA: Diagnosis not present

## 2021-07-10 DIAGNOSIS — R6521 Severe sepsis with septic shock: Secondary | ICD-10-CM | POA: Diagnosis not present

## 2021-07-10 DIAGNOSIS — J80 Acute respiratory distress syndrome: Secondary | ICD-10-CM | POA: Diagnosis not present

## 2021-07-10 LAB — POCT I-STAT 7, (LYTES, BLD GAS, ICA,H+H)
Acid-base deficit: 1 mmol/L (ref 0.0–2.0)
Acid-base deficit: 2 mmol/L (ref 0.0–2.0)
Acid-base deficit: 3 mmol/L — ABNORMAL HIGH (ref 0.0–2.0)
Bicarbonate: 23.8 mmol/L (ref 20.0–28.0)
Bicarbonate: 25 mmol/L (ref 20.0–28.0)
Bicarbonate: 24.2 mmol/L (ref 20.0–28.0)
Calcium, Ion: 1.15 mmol/L (ref 1.15–1.40)
Calcium, Ion: 1.04 mmol/L — ABNORMAL LOW (ref 1.15–1.40)
Calcium, Ion: 1.07 mmol/L — ABNORMAL LOW (ref 1.15–1.40)
HCT: 23 % — ABNORMAL LOW (ref 36.0–46.0)
HCT: 28 % — ABNORMAL LOW (ref 36.0–46.0)
HCT: 28 % — ABNORMAL LOW (ref 36.0–46.0)
Hemoglobin: 7.8 g/dL — ABNORMAL LOW (ref 12.0–15.0)
Hemoglobin: 9.5 g/dL — ABNORMAL LOW (ref 12.0–15.0)
Hemoglobin: 9.5 g/dL — ABNORMAL LOW (ref 12.0–15.0)
O2 Saturation: 84 %
O2 Saturation: 97 %
O2 Saturation: 97 %
Patient temperature: 42.03
Patient temperature: 39
Patient temperature: 98.6
Potassium: 4.1 mmol/L (ref 3.5–5.1)
Potassium: 3.3 mmol/L — ABNORMAL LOW (ref 3.5–5.1)
Potassium: 3.4 mmol/L — ABNORMAL LOW (ref 3.5–5.1)
Sodium: 150 mmol/L — ABNORMAL HIGH (ref 135–145)
Sodium: 160 mmol/L — ABNORMAL HIGH (ref 135–145)
Sodium: 157 mmol/L — ABNORMAL HIGH (ref 135–145)
TCO2: 25 mmol/L (ref 22–32)
TCO2: 27 mmol/L (ref 22–32)
TCO2: 26 mmol/L (ref 22–32)
pCO2 arterial: 50.6 mmHg — ABNORMAL HIGH (ref 32–48)
pCO2 arterial: 60.6 mmHg — ABNORMAL HIGH (ref 32–48)
pCO2 arterial: 55.5 mmHg — ABNORMAL HIGH (ref 32–48)
pH, Arterial: 7.302 — ABNORMAL LOW (ref 7.35–7.45)
pH, Arterial: 7.233 — ABNORMAL LOW (ref 7.35–7.45)
pH, Arterial: 7.247 — ABNORMAL LOW (ref 7.35–7.45)
pO2, Arterial: 71 mmHg — ABNORMAL LOW (ref 83–108)
pO2, Arterial: 120 mmHg — ABNORMAL HIGH (ref 83–108)
pO2, Arterial: 108 mmHg (ref 83–108)

## 2021-07-10 LAB — CBC
HCT: 31.8 % — ABNORMAL LOW (ref 36.0–46.0)
Hemoglobin: 9.8 g/dL — ABNORMAL LOW (ref 12.0–15.0)
MCH: 30.1 pg (ref 26.0–34.0)
MCHC: 30.8 g/dL (ref 30.0–36.0)
MCV: 97.5 fL (ref 80.0–100.0)
Platelets: 81 10*3/uL — ABNORMAL LOW (ref 150–400)
RBC: 3.26 MIL/uL — ABNORMAL LOW (ref 3.87–5.11)
RDW: 18.4 % — ABNORMAL HIGH (ref 11.5–15.5)
WBC: 9.3 10*3/uL (ref 4.0–10.5)
nRBC: 0 % (ref 0.0–0.2)

## 2021-07-10 LAB — CULTURE, BLOOD (ROUTINE X 2)

## 2021-07-10 LAB — APTT
aPTT: 166 seconds (ref 24–36)
aPTT: 125 seconds — ABNORMAL HIGH (ref 24–36)

## 2021-07-10 LAB — COMPREHENSIVE METABOLIC PANEL
ALT: 65 U/L — ABNORMAL HIGH (ref 0–44)
AST: 183 U/L — ABNORMAL HIGH (ref 15–41)
Albumin: 2 g/dL — ABNORMAL LOW (ref 3.5–5.0)
Alkaline Phosphatase: 57 U/L (ref 38–126)
Anion gap: 16 — ABNORMAL HIGH (ref 5–15)
BUN: 35 mg/dL — ABNORMAL HIGH (ref 6–20)
CO2: 24 mmol/L (ref 22–32)
Calcium: 7.4 mg/dL — ABNORMAL LOW (ref 8.9–10.3)
Chloride: 118 mmol/L — ABNORMAL HIGH (ref 98–111)
Creatinine, Ser: 1.73 mg/dL — ABNORMAL HIGH (ref 0.44–1.00)
GFR, Estimated: 34 mL/min — ABNORMAL LOW (ref >60)
Glucose, Bld: 98 mg/dL (ref 70–99)
Potassium: 3.1 mmol/L — ABNORMAL LOW (ref 3.5–5.1)
Sodium: 158 mmol/L — ABNORMAL HIGH (ref 135–145)
Total Bilirubin: 2.3 mg/dL — ABNORMAL HIGH (ref 0.3–1.2)
Total Protein: 4.7 g/dL — ABNORMAL LOW (ref 6.5–8.1)

## 2021-07-10 LAB — GLUCOSE, CAPILLARY
Glucose-Capillary: 89 mg/dL (ref 70–99)
Glucose-Capillary: 100 mg/dL — ABNORMAL HIGH (ref 70–99)
Glucose-Capillary: 122 mg/dL — ABNORMAL HIGH (ref 70–99)

## 2021-07-10 LAB — VANCOMYCIN, RANDOM: Vancomycin Rm: 12 ug/mL

## 2021-07-10 LAB — URINE CULTURE: Culture: >=100000 — AB

## 2021-07-10 LAB — LACTIC ACID, PLASMA: Lactic Acid, Venous: >9 mmol/L (ref 0.5–1.9)

## 2021-07-10 LAB — HEPARIN LEVEL (UNFRACTIONATED): Heparin Unfractionated: >1.1 IU/mL — ABNORMAL HIGH (ref 0.30–0.70)

## 2021-07-10 MED ORDER — ONDANSETRON 4 MG PO TBDP: 4.0000 mg | ORAL_TABLET | Freq: Four times a day (QID) | ORAL | Status: DC | PRN

## 2021-07-10 MED ORDER — ONDANSETRON HCL 4 MG/2ML IJ SOLN: 4.0000 mg | Freq: Four times a day (QID) | INTRAMUSCULAR | Status: DC | PRN

## 2021-07-10 MED ORDER — ACETAMINOPHEN 325 MG PO TABS: 650.0000 mg | ORAL_TABLET | Freq: Four times a day (QID) | ORAL | Status: DC | PRN

## 2021-07-10 MED ORDER — VANCOMYCIN HCL IN DEXTROSE 1-5 GM/200ML-% IV SOLN
1000.0000 mg | Freq: Once | INTRAVENOUS | Status: AC
  Administered 2021-07-10: 1000 mg via INTRAVENOUS
  Filled 2021-07-10: qty 200

## 2021-07-10 MED ORDER — LORAZEPAM 1 MG PO TABS: 1.0000 mg | ORAL_TABLET | ORAL | Status: DC | PRN

## 2021-07-10 MED ORDER — POTASSIUM CHLORIDE 10 MEQ/50ML IV SOLN: 10.0000 meq | INTRAVENOUS | Status: DC

## 2021-07-10 MED ORDER — POTASSIUM CHLORIDE 10 MEQ/50ML IV SOLN
10.0000 meq | INTRAVENOUS | Status: AC
  Administered 2021-07-10 (×6): 10 meq via INTRAVENOUS
  Filled 2021-07-10 (×6): qty 50

## 2021-07-10 MED ORDER — LORAZEPAM 2 MG/ML PO CONC: 1.0000 mg | ORAL | Status: DC | PRN

## 2021-07-10 MED ORDER — HALOPERIDOL 0.5 MG PO TABS: 0.5000 mg | ORAL_TABLET | ORAL | Status: DC | PRN

## 2021-07-10 MED ORDER — GLYCOPYRROLATE 1 MG PO TABS: 1.0000 mg | ORAL_TABLET | ORAL | Status: DC | PRN

## 2021-07-10 MED ORDER — BIOTENE DRY MOUTH MT LIQD: 15.0000 mL | OROMUCOSAL | Status: DC | PRN

## 2021-07-10 MED ORDER — LORAZEPAM 2 MG/ML IJ SOLN: 1.0000 mg | INTRAMUSCULAR | Status: DC | PRN

## 2021-07-10 MED ORDER — HEPARIN (PORCINE) 25000 UT/250ML-% IV SOLN: 400.0000 [IU]/h | INTRAVENOUS | Status: DC

## 2021-07-10 MED ORDER — ACETAMINOPHEN 650 MG RE SUPP: 650.0000 mg | Freq: Four times a day (QID) | RECTAL | Status: DC | PRN

## 2021-07-10 MED ORDER — POLYVINYL ALCOHOL 1.4 % OP SOLN: 1.0000 [drp] | Freq: Four times a day (QID) | OPHTHALMIC | Status: DC | PRN

## 2021-07-10 MED ORDER — HALOPERIDOL LACTATE 5 MG/ML IJ SOLN: 0.5000 mg | INTRAMUSCULAR | Status: DC | PRN

## 2021-07-10 MED ORDER — GLYCOPYRROLATE 0.2 MG/ML IJ SOLN: 0.2000 mg | INTRAMUSCULAR | Status: DC | PRN

## 2021-07-10 MED ORDER — HALOPERIDOL LACTATE 2 MG/ML PO CONC: 0.5000 mg | ORAL | Status: DC | PRN

## 2021-07-10 MED ORDER — POTASSIUM CHLORIDE 20 MEQ PO PACK: 20.0000 meq | PACK | ORAL | Status: DC

## 2021-07-11 LAB — BPAM RBC
Blood Product Expiration Date: 202306262359
Blood Product Expiration Date: 202306272359
Blood Product Expiration Date: 202306272359
Blood Product Expiration Date: 202306282359
ISSUE DATE / TIME: 202306101518
ISSUE DATE / TIME: 202306101518
Unit Type and Rh: 9500
Unit Type and Rh: 9500
Unit Type and Rh: 9500
Unit Type and Rh: 9500

## 2021-07-11 LAB — TYPE AND SCREEN
ABO/RH(D): O NEG
Antibody Screen: POSITIVE
Unit division: 0
Unit division: 0
Unit division: 0
Unit division: 0

## 2021-07-13 LAB — CULTURE, RESPIRATORY W GRAM STAIN

## 2021-07-13 LAB — CULTURE, BLOOD (ROUTINE X 2): Culture: NO GROWTH

## 2021-07-29 NOTE — Progress Notes (Signed)
Critical PTT of 166 called into pharmacy. Labs drawn from a-line, which is on the opposite of arm of where the heparin drip is infusing.   Order to hold heparin drip until 0600. And then resume at new rate.

## 2021-07-29 NOTE — Progress Notes (Signed)
[facility] ADULT ICU REPLACEMENT PROTOCOL   The patient does apply for the [facility] Adult ICU Electrolyte Replacment Protocol based on the criteria listed below:   1.Exclusion criteria: TCTS patients, ECMO patients, and Dialysis patients 2. Is GFR >/= 30 ml/min? Yes.    Patient's GFR today is 34 3. Is SCr </= 2? Yes.   Patient's SCr is 1.73 mg/dL 4. Did SCr increase >/= 0.5 in 24 hours? No. 5.Pt's weight >40kg  Yes.   6. Abnormal electrolyte(s): K  7. Electrolytes replaced per protocol 8.  Call MD STAT for K+ </= 2.5, Phos </= 1, or Mag </= 1 Physician:  Minna Antis Donny Heffern 2021-07-29 1:27 AM

## 2021-07-29 NOTE — IPAL (Signed)
  Interdisciplinary Goals of Care Family Meeting   Date carried out:: 14-Jul-2021  Location of the meeting: Bedside  Member's involved: Physician, Bedside Registered Nurse, and Family Member or next of kin  Durable Power of Attorney or acting medical decision maker: Arlyss Gandy    Discussion: We discussed goals of care for [employer] .    The Clinical status was relayed to patient's family at bedside  in detail.   Updated and notified of patients medical condition.     Patient remains unresponsive and will not open eyes to command.   Patient is having a weak cough and struggling to remove secretions.   Patient with increased WOB and using accessory muscles to breathe Explained to family course of therapy and the modalities    Patient with Progressive multiorgan failure with a very high probablity of a very minimal chance of meaningful recovery despite all aggressive and optimal medical therapy.  Code status: Full DNR  Disposition: In-patient comfort care    Family are satisfied with Plan of action and management. All questions answered   Jacky Kindle MD [facility] See Amion for pager If no response to pager, please call [phone] until 7pm After 7pm, Please call E-link [phone]

## 2021-07-29 NOTE — Progress Notes (Signed)
ANTICOAGULATION CONSULT NOTE - Follow Up Consult  Pharmacy Consult for heparin Indication:  h/o PE/DVT  Labs: Recent Labs    07-19-21 2305 06/29/2021 0150 07/02/2021 0739 07/01/2021 0921 07/14/2021 1327 07/09/21 0012 07/09/21 0037 07/09/21 0434 07/09/21 1529 07/09/21 2100 07/09/21 2354 07/18/2021 0310 07/15/2021 0418  HGB 12.4   < > 9.9*  9.5*  --    < > 10.7*  --  10.0*  --  9.9*  --  9.8* 9.5*  HCT 40.7   < > 29.0*  28.0*  --    < > 33.6*  --  32.2*  --  29.0*  --  31.8* 28.0*  PLT 234   < >  --   --    < > 108*  --  108*  --   --   --  81*  --   APTT 36  --   --  98*  --   --   --  99* 95*  --   --  166*  --   LABPROT 27.6*  --   --   --   --   --   --   --   --   --   --   --   --   INR 2.6*  --   --   --   --   --   --   --   --   --   --   --   --   HEPARINUNFRC  --   --   --  >1.10*  --   --   --  >1.10*  --   --   --  >1.10*  --   CREATININE 5.30*   < > 3.20*  --   --   --  2.24*  --   --   --  1.73*  --   --    < > = values in this interval not displayed.    Assessment: 56yo female supratherapeutic on heparin after several PTTs at upper end of goal; no infusion issues or signs of bleeding per RN.  Goal of Therapy:  aPTT 66-102 seconds   Plan:  Will hold heparin infusion x1h then decrease rate by 3 units/kg/hr to 600 units/hr and check PTT in 8 hours.    Vernard Gambles, PharmD, BCPS  07/23/2021,5:12 AM

## 2021-07-29 NOTE — Progress Notes (Signed)
NAME:  Rachel Vang, MRN:  [id number], DOB:  [birth year], LOS: 3 ADMISSION DATE:  07/03/2021, CONSULTATION DATE: 07/06/2021 REFERRING MD:  Bethann Humble, CHIEF COMPLAINT: Acute respiratory failure  History of Present Illness:  Rachel Vang is a 56 year old woman with a history of SAH who is bedbound at baseline and mostly dependent on PEG tube who presents after being found unresponsive at [facility] facility.  EMS was unable to intubate her despite severe hypoxia due to poor mouth opening.  She required cardioversion for tachycardia with heart rates in the 180s.  In the ED she was intubated after RSI medications were given, although she was a difficult airway.  She had dark brown thin liquid suctioned from her NG tube and from her airway.  She remains tachycardic, hypotensive, tachypneic.  She was started on peripheral norepinephrine for hypotension.  Her son indicated that at baseline she is bedbound, but is able to communicate, uses her tablet, and frequently watches TV.  She sometimes eats, but still has a PEG tube.  She has a history of previous tracheostomy.  Previously been on apixaban for history of pulmonary embolism.  She is a chronic Foley catheter since her most recent hospital admission several months ago.  She has a history of GU bleeding, LABA as recent as a month ago that had been attributed to her chronic anticoagulation.  Her son says this is resolved.  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; trach during that admission> decannulated since PE, DVT on chronic anticoagulation GERD Epilepsy Diabetes  Significant Hospital Events: Including procedures, antibiotic start and stop dates in addition to other pertinent events   6/9 admission to ICU 6/10 underwent laparotomy for bowel obstruction  Interim History / Subjective:  Patient remained hypotensive, requiring high-dose vasopressors, currently on Levophed, vasopressin,  epinephrine and phenylephrine ABGs consistent with ARDS with P/F ratio 108 Remain febrile and tachycardic  Objective   Blood pressure 117/79, pulse (!) 139, temperature (!) 102.6 F (39.2 C), resp. rate (!) 28, height 5\' 8"  (1.727 m), weight 76.7 kg, SpO2 (!) 69 %.    Vent Mode: PRVC FiO2 (%):  [100 %] 100 % Set Rate:  [32 bmp] 32 bmp Vt Set:  [450 mL] 450 mL PEEP:  [15 cmH20] 15 cmH20 Plateau Pressure:  [30 cmH20-32 cmH20] 31 cmH20   Intake/Output Summary (Last 24 hours) at 07/25/21 0901 Last data filed at 07/25/2021 0800 Gross per 24 hour  Intake 9626.31 ml  Output 9265 ml  Net 361.31 ml   Filed Weights   07/11/2021 0340 07/09/21 0500 07-25-2021 0500  Weight: 67.2 kg 67.2 kg 76.7 kg    Examination: General: Critically ill-appearing woman lying in bed intubated HENT: [name]/AT, eyes anicteric. Poor dentition, dental carries.  ETT/OGT in place Lungs: Coarse rales bilaterally,  thin brown secretions via ETT.   Cardiovascular: Tachycardic in the 140s, regular rhythm Abdomen: Soft, distended.  PEG tube.  Surgical wound looks clean, wound VAC in place with serous fluid Extremities: No peripheral edema, minimal muscle mass Neuro: Unresponsive, pupils reactive, no significant gag reflex.  Breathing over the vent. GU: Foley with clear urine Skin: Mottled all over.  Head ecchymosis on the right forearm, cold to touch, pulses are present  ABG    Component Value Date/Time   PHART 7.247 (L) 07/25/21 0640   PCO2ART 55.5 (H) 2021-07-25 0640   PO2ART 108 2021/07/25 0640   HCO3 24.2 07/25/2021 0640   TCO2 26 2021/07/25 0640  ACIDBASEDEF 3.0 (H) July 20, 2021 0640   O2SAT 97 2021/07/20 0640    Resolved Hospital Problem list     Assessment & Plan:  Acute respiratory failure with hypoxia due to aspiration pneumonia bilaterally Severe ARDS Previous tracheostomy, now decannulated Diffuse subcutaneous emphysema Continue lung protective ventilation Her P/F ratio is slightly improved now  is 108, but unable to prone her considering she is unstable with 4 pressors and with open abdomen PAD protocol for sedation; fentanyl and Versed infusion at low-dose  VAP prevention protocol Follow-up respiratory culture Continue IV Zosyn, vancomycin and micafungin MRSA swab is positive X-ray chest this morning suggestive of diffuse subcutaneous emphysema, no pneumothorax Closely monitor  Severe sepsis with septic shock due to aspiration pneumonia  Proteus bacteremia Complicated UTI with Proteus in the setting of chronic indwelling Foley catheter and mild bilateral hydronephrosis Lactic acidosis Patient is on high-dose vasopressors including norepinephrine, epinephrine, phenylephrine and vasopressin Continue with map goal 65 Continue broad-spectrum IV antibiotics Blood and urine culture grew Proteus, sensitive results are pending Trend lactate Foley was replaced, she is draining clear urine now Lactate remains elevated >9  Small bowel obstruction s/p laparotomy with lysis of adhesions and wound VAC placement CT abdomen is suggestive of bowel obstruction, lactate is elevated, patient has distended abdomen Patient underwent laparotomy with wound VAC placement and lysis of adhesions Her abdomen is open currently General surgery is following, appreciate their recommendations, initial plan was to take her back to the OR today but as she is unstable with 4 pressors on board, plan is to wait until she gets better  AKI, due to septic ATN Hypernatremia Continue IV fluid resuscitation with D5 LR Serum creatinine continue to trend down, almost back to baseline Serum sodium trended down to 157 Monitor intake and output Renal ultrasound showed mild hydronephrosis Foley catheter was replaced Avoid nephrotoxic agents  Acute septic/metabolic encephalopathy In the setting of sepsis, hypoxia and metabolic acidosis Avoid sedation CT head was negative for acute findings EEG showed diffuse  encephalopathy  History of previous subarachnoid hemorrhage with residual right hemiparesis Bedbound at baseline.  Hypocalcemia Continue aggressive electrolyte supplement  Diabetes type II Continue SSI as needed Goal BG 140-180  History of DVT and PE Hold Eliquis Continue IV heparin infusion PTT came back more than 166, hold for 1 hour and resume per protocol  Grave prognosis  Best Practice (right click and "Reselect all SmartList Selections" daily)   Diet/type: NPO DVT prophylaxis: systemic heparin GI prophylaxis: PPI Lines: Central line Foley:  Yes, and it is still needed Code Status:  full code Last date of multidisciplinary goals of care discussion [patient's son  updated on 6/12]  Labs   CBC: Recent Labs  Lab 07/06/2021 2241 07/28/2021 2243 07/18/2021 0455 07/04/2021 0536 07/13/2021 1327 07/12/2021 1402 07/09/21 0012 07/09/21 0434 07/09/21 2100 07/20/2021 0310 07/20/2021 0418 07-20-21 0640  WBC 8.9   < > 1.0*  --  1.7*  --  2.7* 2.9*  --  9.3  --   --   NEUTROABS 6.8  --   --   --   --   --  1.6*  --   --   --   --   --   HGB 12.0   < > 9.6*   < > 8.7*   < > 10.7* 10.0* 9.9* 9.8* 9.5* 9.5*  HCT 41.0   < > 29.8*   < > 27.4*   < > 33.6* 32.2* 29.0* 31.8* 28.0* 28.0*  MCV 104.9*   < >  96.1  --  95.8  --  94.4 95.0  --  97.5  --   --   PLT 225   < > 136*  --  125*  --  108* 108*  --  81*  --   --    < > = values in this interval not displayed.    Basic Metabolic Panel: Recent Labs  Lab 07/13/2021 2241 07/22/2021 2243 06/29/2021 0228 07/14/2021 0411 07/07/2021 0536 06/29/2021 [id number] 07/13/2021 1402 07/09/21 0037 07/09/21 2100 07/09/21 2354 07-24-21 0418 07-24-21 0640  NA 152*   < > 144 146*   < > 146*  146*   < > 159* 159* 158* 160* 157*  K 5.8*   < > 4.2 4.1   < > 4.3  4.5   < > 3.6 3.0* 3.1* 3.3* 3.4*  CL 117*  --  111 111  --  111  --  115*  --  118*  --   --   CO2 13*  --  21* 22  --   --   --  24  --  24  --   --   GLUCOSE 213*  --  184* 144*  --  101*  --  181*  --   98  --   --   BUN 82*  --  68* 67*  --  74*  --  42*  --  35*  --   --   CREATININE 5.44*   < > 3.99* 3.83*  --  3.20*  --  2.24*  --  1.73*  --   --   CALCIUM 9.0  --  8.0* 8.3*  --   --   --  8.7*  --  7.4*  --   --   MG  --   --   --  1.6*  --   --   --   --   --   --   --   --   PHOS  --   --   --  3.8  --   --   --   --   --   --   --   --    < > = values in this interval not displayed.   GFR: Estimated Creatinine Clearance: 38.6 mL/min (A) (by C-G formula based on SCr of 1.73 mg/dL (H)). Recent Labs  Lab 06/29/2021 0352 06/30/2021 0455 06/29/2021 0921 06/29/2021 1327 07/09/21 0012 07/09/21 0434 07/09/21 2058 07/09/21 2354 07-24-21 0310  WBC  --    < >  --  1.7* 2.7* 2.9*  --   --  9.3  LATICACIDVEN 6.9*  --  >9.0*  --   --   --  8.8* >9.0*  --    < > = values in this interval not displayed.    Liver Function Tests: Recent Labs  Lab 07/20/2021 2241 07/09/21 2354  AST 43* 183*  ALT 37 65*  ALKPHOS 61 57  BILITOT 0.6 2.3*  PROT 6.7 4.7*  ALBUMIN 2.6* 2.0*   No results for input(s): "LIPASE", "AMYLASE" in the last 168 hours. No results for input(s): "AMMONIA" in the last 168 hours.  ABG    Component Value Date/Time   PHART 7.247 (L) 2021/07/24 0640   PCO2ART 55.5 (H) 2021-07-24 0640   PO2ART 108 07-24-2021 0640   HCO3 24.2 2021/07/24 0640   TCO2 26 07-24-21 0640   ACIDBASEDEF 3.0 (H) 07-24-21 0640   O2SAT 97 Jul 24, 2021 0640  Coagulation Profile: Recent Labs  Lab 07/24/2021 2305  INR 2.6*    Cardiac Enzymes: No results for input(s): "CKTOTAL", "CKMB", "CKMBINDEX", "TROPONINI" in the last 168 hours.  HbA1C: Hemoglobin A1C  Date/Time Value Ref Range Status  05/29/2017 12:00 AM 6.1  Final  01/25/2017 12:00 AM 6.1  Final   Hgb A1c MFr Bld  Date/Time Value Ref Range Status  12/12/2020 04:06 AM 6.0 (H) 4.8 - 5.6 % Final    Comment:    (NOTE)         Prediabetes: 5.7 - 6.4         Diabetes: >6.4         Glycemic control for adults with diabetes: <7.0    04/05/2020 10:12 AM 5.8 (H) 4.8 - 5.6 % Final    Comment:    (NOTE) Pre diabetes:          5.7%-6.4%  Diabetes:              >6.4%  Glycemic control for   <7.0% adults with diabetes     CBG: Recent Labs  Lab 07/09/21 1530 07/09/21 1938 07/09/21 2351 2021/08/04 0310 Aug 04, 2021 0758  GLUCAP 141* 102* 83 89 100*    Critical care time:     Total critical care time: 38 minutes  Performed by: [place] care time was exclusive of separately billable procedures and treating other patients.   Critical care was necessary to treat or prevent imminent or life-threatening deterioration.   Critical care was time spent personally by me on the following activities: development of treatment plan with patient and/or surrogate as well as nursing, discussions with consultants, evaluation of patient's response to treatment, examination of patient, obtaining history from patient or surrogate, ordering and performing treatments and interventions, ordering and review of laboratory studies, ordering and review of radiographic studies, pulse oximetry and re-evaluation of patient's condition.   Jacky Kindle, MD [facility] See Amion for pager If no response to pager, please call [phone] until 7pm After 7pm, Please call E-link [phone]

## 2021-07-29 NOTE — Progress Notes (Addendum)
Pt left eye swollen along with upper left chest and neck area. When this area is palpitated, crepitus is felt. Pola Corn MD notified and stat chest xray ordered.

## 2021-07-29 NOTE — Progress Notes (Signed)
ANTICOAGULATION CONSULT NOTE  Pharmacy Consult for heparin Indication: History of PE/DVT  No Known Allergies  Vital Signs: Temp: 104.4 F (40.2 C) (06/12 1508) Temp Source: Bladder (06/12 0400) BP: 93/59 (06/12 1508) Pulse Rate: 154 (06/12 1508)  Labs: Recent Labs    07/02/2021 2305 July 19, 2021 0150 Jul 19, 2021 0739 July 19, 2021 0921 07/19/2021 1327 07/09/21 0012 07/09/21 0037 07/09/21 0434 07/09/21 1529 07/09/21 2100 07/09/21 2354 07/28/2021 0310 07/01/2021 0418 07/16/2021 0640 07/06/2021 1343  HGB 12.4   < > 9.9*  9.5*  --    < > 10.7*  --  10.0*  --    < >  --  9.8* 9.5* 9.5*  --   HCT 40.7   < > 29.0*  28.0*  --    < > 33.6*  --  32.2*  --    < >  --  31.8* 28.0* 28.0*  --   PLT 234   < >  --   --    < > 108*  --  108*  --   --   --  81*  --   --   --   APTT 36  --   --  98*  --   --   --  99* 95*  --   --  166*  --   --  125*  LABPROT 27.6*  --   --   --   --   --   --   --   --   --   --   --   --   --   --   INR 2.6*  --   --   --   --   --   --   --   --   --   --   --   --   --   --   HEPARINUNFRC  --   --   --  >1.10*  --   --   --  >1.10*  --   --   --  >1.10*  --   --   --   CREATININE 5.30*   < > 3.20*  --   --   --  2.24*  --   --   --  1.73*  --   --   --   --    < > = values in this interval not displayed.    Assessment: 56yo female found unresponsive at NH, now intubated with concern for sepsis with aspiration noted by EDP.  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  Patient is s/p ex-lap with LOA and application of wound vac on 6/10.  She did not undergo a resection and abdomen is left open.  Pharmacy asked to resume IV heparin.  Surgery expects some oozing from the mesentery.  aPTT supra-therapeutic at 125 sec.  Confirmed lab was drawn appropriately.  No overt bleeding reported but patient's NG tube is dark.  Hemoglobin is stable.  Goal of Therapy:  Heparin level 0.3-0.7 units/ml aPTT 66-102 seconds Monitor platelets by anticoagulation protocol:  Yes   Plan:  Reduce heparin infusion to 400 units/hr Check 6 hr aPTT  Niaomi Cartaya D. Laney Potash, PharmD, BCPS, BCCCP 07/03/2021, 3:40 PM

## 2021-07-29 NOTE — Progress Notes (Signed)
Pt extubated to comfort care at this time.

## 2021-07-29 NOTE — Progress Notes (Signed)
Patient is on comfort care with family at the bedside

## 2021-07-29 NOTE — Death Summary Note (Signed)
DEATH SUMMARY   Patient Details  Name: Rachel Vang MRN: [id number] DOB: [birth year]  Admission/Discharge Information   Admit Date:  2021/07/31  Date of Death: Date of Death: August 03, 2021  Time of Death: Time of Death: 1825  Length of Stay: 3  Referring Physician: Patient, No Pcp Per (Inactive)   Reason(s) for Hospitalization  Acute respiratory failure with hypoxia due to aspiration pneumonia bilaterally Severe ARDS Previous tracheostomy, now decannulated Diffuse subcutaneous emphysema Severe sepsis with septic shock due to aspiration pneumonia  Proteus bacteremia Complicated UTI with Proteus in the setting of chronic indwelling Foley catheter and mild bilateral hydronephrosis Lactic acidosis Small bowel obstruction s/p laparotomy with lysis of adhesions and wound VAC placement AKI, due to septic ATN Hypernatremia Acute septic/metabolic encephalopathy Hypocalcemia Diabetes type II 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 year old female who with a history of SAH who is bedbound at baseline and mostly dependent on PEG tube who presents after being found unresponsive at [facility] skilled nursing facility.  EMS was unable to intubate her despite severe hypoxia due to poor mouth opening.  She required cardioversion for tachycardia with heart rates in the 180s.  In the ED she was intubated after RSI medications were given, although she was a  difficult airway.  She had dark brown thin liquid suctioned from her NG tube and from her airway.  She remains tachycardic, hypotensive, tachypneic.  She was started on peripheral norepinephrine for hypotension.   Her son indicated that at baseline she is bedbound, but is able to communicate, uses her tablet, and frequently watches TV.  She sometimes eats, but still has a PEG tube.  She has a history of previous tracheostomy.  Previously been on apixaban for history of pulmonary embolism.  She is a chronic Foley catheter since her most recent hospital admission several months ago  Patient was admitted to ICU, was started on broad-spectrum antibiotics, she became hypotensive, requiring high-dose vasopressors, Foley catheter was changed with resultant pus coming through, then it was blood-tinged and finally cleared out, blood culture grew Proteus and urine culture grew Proteus as well.  Patient went to severe ARDS due to aspiration pneumonia, she was not proned because of hemodynamic unstable VS.  CT abdomen and pelvis was suggestive of acute intestinal obstruction, general surgery was consulted, patient went to the OR, had laparotomy with adhesion of lysis, abdomen remained open, wound VAC was placed and she was transferred to ICU.  Throughout her stay she remained severely hypotensive and hypoxic without much improvement despite maximal medical therapy, goals of care discussions were carried with family, as patient was not improving patient family decided to keep her DNR and proceed with palliative extubation while keeping her comfort care.  Patient was palliatively extubated on 03-Aug-2021 and she was declared dead at 6:25 PM, patient's family was at bedside   Pertinent Labs and Studies  Significant Diagnostic Studies DG CHEST PORT 1 VIEW  Result Date: 03-Aug-2021 CLINICAL DATA:  Respiratory failure EXAM: PORTABLE CHEST  1 VIEW COMPARISON:  Yesterday FINDINGS: Endotracheal tube with tip between the  clavicular heads and carina. Left IJ central line with tip at the upper cavoatrial junction. New extensive pneumomediastinum and chest wall emphysema. No detected pneumothorax. Similar pattern of extensive bilateral pneumonia. Normal heart size. Findings called to Nadeen Landau, findings were expected based on exam. IMPRESSION: 1. New extensive pneumomediastinum and chest wall emphysema. No visible pneumothorax. 2. Unchanged multifocal pneumonia. 3. Unchanged hardware positioning. Electronically Signed   By: Tiburcio Pea M.D.   On: 06/29/2021 06:36   DG CHEST PORT 1 VIEW  Result Date: 07/09/2021 CLINICAL DATA:  Respiratory failure. EXAM: PORTABLE CHEST 1 VIEW COMPARISON:  Chest radiograph 07/18/2021 FINDINGS: Endotracheal tube terminates 3 cm above the carina. Left jugular catheter terminates over the lower SVC. The cardiomediastinal silhouette is unchanged with normal heart size. Widespread patchy and confluent airspace opacities in both lungs have not significantly changed. No sizable pleural effusion or pneumothorax is identified. IMPRESSION: Unchanged extensive bilateral airspace disease. Electronically Signed   By: Sebastian Ache M.D.   On: 07/09/2021 09:51   DG Chest Port 1 View  Result Date: 07/15/2021 CLINICAL DATA:  Acute respiratory failure, hypoxia EXAM: PORTABLE CHEST 1 VIEW COMPARISON:  07/19/2021 FINDINGS: Patchy bilateral airspace disease again noted, worsening in the right lower lobe since prior study, stable otherwise. Heart is normal size. No effusions or pneumothorax. Endotracheal tube, left central line remain in place, unchanged. IMPRESSION: Bilateral patchy airspace disease, worsening on the right since prior study. Electronically Signed   By: Charlett Nose M.D.   On: 07/22/2021 23:14   DG CHEST PORT 1 VIEW  Result Date: 07/06/2021 CLINICAL DATA:  Respiratory failure EXAM: PORTABLE CHEST 1 VIEW COMPARISON:  07/11/21 FINDINGS: Worsening patchy bilateral airspace disease concerning  for pneumonia. Heart is normal size. Endotracheal tube and left central line remain in place, unchanged. No visible effusions or pneumothorax. IMPRESSION: Worsening patchy bilateral airspace disease concerning for pneumonia. Electronically Signed   By: Charlett Nose M.D.   On: 07/19/2021 19:32   CT ABDOMEN PELVIS WO CONTRAST  Result Date: 07/14/2021 CLINICAL DATA:  Bowel obstruction suspected.  Sepsis. EXAM: CT ABDOMEN AND PELVIS WITHOUT CONTRAST TECHNIQUE: Multidetector CT imaging of the abdomen and pelvis was performed following the standard protocol without IV contrast. RADIATION DOSE REDUCTION: This exam was performed according to the departmental dose-optimization program which includes automated exposure control, adjustment of the mA and/or kV according to patient size and/or use of iterative reconstruction technique. COMPARISON:  Earlier the same day FINDINGS: Lower chest: Extensive bilateral pneumonia with progression from before. Extensive coronary atherosclerosis. Hepatobiliary: No focal liver abnormality.No evidence of biliary obstruction or stone. Pancreas: Generalized atrophy Spleen: Unremarkable. Adrenals/Urinary Tract: Negative adrenals. Improvement in bilateral hydronephrosis after bladder decompression. Stomach/Bowel: Persisting small bowel dilatation above a transition point where there is twisting of the due to twist in the small bowel mesentery. A loop just before the twist appears more thick walled and dense both at the level of the bowel wall in luminal contents. No perforation at this time. Located peg. The stomach has been partially decompressed. Vascular/Lymphatic: Atherosclerosis affecting the aorta, iliacs, and mesenteric branches. No mass or adenopathy. Reproductive:Hysterectomy Other: Retroperitoneal edema which may be from third spacing in this septic patient. Musculoskeletal: No acute abnormalities. Case reviewed with Dr. Andrey Campanile via telephone IMPRESSION: 1. Continued small bowel  obstruction with underlying mesenteric twisting/volvulus appearance. The loop before the transition point is becoming thicker, possible sign of vessel compromise. No pneumatosis or perforation. 2.  Worsening of widespread pneumonia. 3. Advanced atherosclerosis. 4. Decompression of the urinary bladder with improved hydronephrosis. Electronically Signed   By: Tiburcio Pea M.D.   On: 07/22/2021 12:55   US RENAL  Result Date: 07/13/2021 CLINICAL DATA:  Acute kidney injury. EXAM: RENAL / URINARY TRACT ULTRASOUND COMPLETE COMPARISON:  CT 07/28/2021 FINDINGS: Right Kidney: Renal measurements: 12.1 x 5.4 x 5.4 cm = volume: 184.7 ML. Echogenicity within normal limits. No mass or hydronephrosis visualized. Left Kidney: Renal measurements: 12 x 5.3 x 5.5 cm = volume: 180.3 mL. There is mild hydronephrosis. Normal parenchymal echogenicity. No mass identified. Bladder: Decompressed around a Foley catheter. Other: None. IMPRESSION: 1. Mild left-sided hydronephrosis. 2. Normal appearance of the right kidney. Electronically Signed   By: Signa Kell M.D.   On: 06/30/2021 08:48   EEG adult  Result Date: 07/25/2021 Charlsie Quest, MD     07/14/2021  2:03 AM Patient Name: KIRANDEEP FARISS MRN: [id number] Epilepsy Attending: Charlsie Quest Referring Physician/Provider: Steffanie Dunn, DO Date: 07/20/2021 Duration: 21.10 mins Patient history: 56yo F with ams. EEG to evaluate for seizure Level of alertness:  lethargic AEDs during EEG study: LEV, GBP Technical aspects: This EEG study was done with scalp electrodes positioned according to the 10-20 International system of electrode placement. Electrical activity was acquired at a sampling rate of  and reviewed with a high frequency filter of  and a low frequency filter of . EEG data were recorded continuously and digitally stored. Description: EEG showed continuous generalized 3 to 6 Hz theta-delta slowing. Hyperventilation and photic stimulation were not  performed.   Of note, study was technically difficult due to ventilator artifact seen throughout the study ABNORMALITY - Continuous slow, generalized IMPRESSION: This technically difficult study is suggestive of severe diffuse encephalopathy, nonspecific etiology. No seizures or epileptiform discharges were seen throughout the recording. Priyanka Annabelle Harman   CT ABDOMEN PELVIS WO CONTRAST  Result Date: 07/11/2021 CLINICAL DATA:  Found unresponsive.  Bowel obstruction suspected EXAM: CT ABDOMEN AND PELVIS WITHOUT CONTRAST TECHNIQUE: Multidetector CT imaging of the abdomen and pelvis was performed following the standard protocol without IV contrast. RADIATION DOSE REDUCTION: This exam was performed according to the departmental dose-optimization program which includes automated exposure control, adjustment of the mA and/or kV according to patient size and/or use of iterative reconstruction technique. COMPARISON:  12/12/2020 FINDINGS: Lower chest: Airspace opacities in both lower lobes concerning for pneumonia. Aspiration possible. Heart is normal size. No effusions. Hepatobiliary: No focal hepatic abnormality. Gallbladder unremarkable. Pancreas: No focal abnormality or ductal dilatation. Spleen: No focal abnormality.  Normal size. Adrenals/Urinary Tract: Moderate bilateral hydronephrosis. Possible layering bladder stones posteriorly. No visible ureteral stones. Urinary bladder moderately distended. Foley catheter balloon appears to be located inferior to the bladder, possibly within the urethra. Recommend repositioning. Adrenal glands unremarkable. Stomach/Bowel: Gastrostomy tube and NG tube are in the stomach. Large bowel is decompressed. There is gaseous distention of stomach and small bowel loops with air-fluid levels. Findings concerning for distal small bowel obstruction. Swirled appearance of the mesenteric vessels in the lower abdomen and upper pelvis raises possibility of volvulus or internal hernia.  Vascular/Lymphatic: Aortic atherosclerosis. No evidence of aneurysm or adenopathy. Reproductive: Prior hysterectomy.  No adnexal masses. Other: No free fluid or free air. Musculoskeletal: No acute bony abnormality. IMPRESSION: Dilated stomach and small bowel with air-fluid levels. Findings compatible with distal small bowel obstruction. Swirled appearance of bowel and mesenteric vessels in the lower abdomen and upper pelvis raise the possibility of  volvulus or internal hernia. Airspace opacities in both lower lobes most compatible with pneumonia, possibly aspiration related. Bilateral hydronephrosis to the level of the bladder. Bladder is distended. Foley catheter balloon appears to be below the bladder, likely within the urethra. Recommend repositioning. Aortic atherosclerosis. Electronically Signed   By: Charlett Nose M.D.   On: 08/06/2021 00:50   CT HEAD WO CONTRAST ( )  Result Date: 06-Aug-2021 CLINICAL DATA:  Altered mental status, nontraumatic. Found unresponsive EXAM: CT HEAD WITHOUT CONTRAST TECHNIQUE: Contiguous axial images were obtained from the base of the skull through the vertex without intravenous contrast. RADIATION DOSE REDUCTION: This exam was performed according to the departmental dose-optimization program which includes automated exposure control, adjustment of the mA and/or kV according to patient size and/or use of iterative reconstruction technique. COMPARISON:  02/04/2015 FINDINGS: Brain: Encephalomalacia within the medial frontal lobes and bilateral sylvian fissures, left anterior temporal lobe. Extensive chronic small vessel disease throughout the deep white matter. No hydrocephalus, acute infarction or hemorrhage. Vascular: A-comm aneurysm coils noted, stable. No hyperdense vessel. Skull: No acute calvarial abnormality. 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 Extensive chronic small vessel disease. No acute intracranial abnormality. Electronically Signed   By: Charlett Nose M.D.   On: 08/06/2021 00:44   DG Abd 1 View  Result Date: 2021-08-06 CLINICAL DATA:  Vomiting EXAM: ABDOMEN - 1 VIEW COMPARISON:  12/12/2020 FINDINGS: Gastrostomy tube projects over the midline. OG tube tip in the midline, likely within the proximal stomach. Diffuse gaseous distention of bowel. This appears to be both large and small bowel, favor ileus. No visible free air. IMPRESSION: Diffuse gaseous distention of bowel, favor ileus. Electronically Signed   By: Charlett Nose M.D.   On: 08-06-2021 00:00   DG Chest Portable 1 View  Result Date: 07/25/2021 CLINICAL DATA:  Central line placement. EXAM: PORTABLE CHEST 1 VIEW COMPARISON:  Chest x-ray 07/01/2021. FINDINGS: Endotracheal tube tip is 1.8 cm above the carina. There is a new enteric tube with distal tip in the proximal stomach just below the gastroesophageal junction. Left-sided central venous catheter tip projects over the SVC. The patient is rotated. Cardiomediastinal silhouette is grossly unchanged. Bilateral multifocal airspace disease has not significantly changed. No pleural effusion or pneumothorax identified. Osseous structures are stable. Dilated small bowel loops in the upper abdomen persists. IMPRESSION: 1. Nasogastric tube in the proximal stomach just beyond the gastroesophageal junction. Recommend advancing tube. 2. Central venous catheter tip projects over the SVC. 3. Stable bilateral airspace disease. 4. Stable dilated small bowel loops compatible small bowel obstruction. Electronically Signed   By: Darliss Cheney M.D.   On: 07/13/2021 23:46   DG Chest Portable 1 View  Result Date: 07/09/2021 CLINICAL DATA:  post intubation EXAM: PORTABLE CHEST 1 VIEW.  Patient is rotated. COMPARISON:  CT abdomen pelvis 12/12/2020 FINDINGS: Endotracheal tube with tip terminating 2.5 cm above the carina. Enteric  tube with tip coursing below the hemidiaphragm and side port overlying the expected region of the distal esophagus. The heart and mediastinal contours are unchanged. Low lung volumes. Query left lower lobe patchy airspace opacity. No pulmonary edema. No pleural effusion. No pneumothorax. No acute osseous abnormality. Gaseous dilatation of the small bowel noted within the upper abdomen. IMPRESSION: 1. Enteric tube with tip coursing below the hemidiaphragm and side port overlying the expected region of the distal esophagus. Recommend advancing by 10 cm. 2. Low lung  volumes with query left lower lobe patchy airspace opacity. Recommend PA and lateral view of the chest. 3. Gaseous dilatation of the small bowel noted within the upper abdomen suggestive of obstruction. Recommend CT abdomen pelvis with intravenous contrast. Electronically Signed   By: Tish Frederickson M.D.   On: 07/25/2021 22:29    Microbiology Recent Results (from the past 240 hour(s))  Resp Panel by RT-PCR (Flu A&B, Covid) Anterior Nasal Swab     Status: None   Collection Time: 07/16/2021 10:14 PM   Specimen: Anterior Nasal Swab  Result Value Ref Range Status   SARS Coronavirus 2 by RT PCR NEGATIVE NEGATIVE Final    Comment: (NOTE) SARS-CoV-2 target nucleic acids are NOT DETECTED.  The SARS-CoV-2 RNA is generally detectable in upper respiratory specimens during the acute phase of infection. The lowest concentration of SARS-CoV-2 viral copies this assay can detect is 138 copies/mL. A negative result does not preclude SARS-Cov-2 infection and should not be used as the sole basis for treatment or other patient management decisions. A negative result may occur with  improper specimen collection/handling, submission of specimen other than nasopharyngeal swab, presence of viral mutation(s) within the areas targeted by this assay, and inadequate number of viral copies(<138 copies/mL). A negative result must be combined with clinical  observations, patient history, and epidemiological information. The expected result is Negative.  Fact Sheet for Patients:  BloggerCourse.com  Fact Sheet for Healthcare Providers:  SeriousBroker.it  This test is no t yet approved or cleared by the [place] FDA and  has been authorized for detection and/or diagnosis of SARS-CoV-2 by FDA under an Emergency Use Authorization (EUA). This EUA will remain  in effect (meaning this test can be used) for the duration of the COVID-19 declaration under Section 564(b)(1) of the Act, 21 U.S.C.section 360bbb-3(b)(1), unless the authorization is terminated  or revoked sooner.       Influenza A by PCR NEGATIVE NEGATIVE Final   Influenza B by PCR NEGATIVE NEGATIVE Final    Comment: (NOTE) The Xpert Xpress SARS-CoV-2/FLU/RSV plus assay is intended as an aid in the diagnosis of influenza from Nasopharyngeal swab specimens and should not be used as a sole basis for treatment. Nasal washings and aspirates are unacceptable for Xpert Xpress SARS-CoV-2/FLU/RSV testing.  Fact Sheet for Patients: BloggerCourse.com  Fact Sheet for Healthcare Providers: SeriousBroker.it  This test is not yet approved or cleared by the [place] FDA and has been authorized for detection and/or diagnosis of SARS-CoV-2 by FDA under an Emergency Use Authorization (EUA). This EUA will remain in effect (meaning this test can be used) for the duration of the COVID-19 declaration under Section 564(b)(1) of the Act, 21 U.S.C. section 360bbb-3(b)(1), unless the authorization is terminated or revoked.  Performed at [facility], [address], [place], [place] [address]   MRSA Next Gen by PCR, Nasal     Status: Abnormal   Collection Time: 06/30/2021 11:29 PM   Specimen: Urine, Catheterized; Nasal Swab  Result Value Ref Range Status   MRSA by PCR Next Gen DETECTED  (A) NOT DETECTED Final    Comment: RESULT CALLED TO READ BACK BY AND VERIFIED WITH RN A.JANVARI  ON 09-Jul-2021 BY NM (NOTE) The GeneXpert MRSA Assay (FDA approved for NASAL specimens only), is one component of a comprehensive MRSA colonization surveillance program. It is not intended to diagnose MRSA infection nor to guide or monitor treatment for MRSA infections. Test performance is not FDA approved in patients less than 2  years old. Performed at [facility], [address] [place], [place] [address]   Remove and replace urinary cath (placed > 5 days) then obtain urine culture from new indwelling urinary catheter.     Status: Abnormal   Collection Time: 07/11/2021 12:50 AM   Specimen: Urine, Catheterized  Result Value Ref Range Status   Specimen Description URINE, CATHETERIZED  Final   Special Requests   Final    NONE Performed at [facility], [address] [place], [place] [address]    Culture >=100,000 COLONIES/mL PROTEUS MIRABILIS (A)  Final   Report Status 07-02-2021 FINAL  Final   Organism ID, Bacteria PROTEUS MIRABILIS (A)  Final      Susceptibility   Proteus mirabilis - MIC*    AMPICILLIN <=2 SENSITIVE Sensitive     CEFAZOLIN <=4 SENSITIVE Sensitive     CEFEPIME <=0.12 SENSITIVE Sensitive     CEFTRIAXONE <=0.25 SENSITIVE Sensitive     CIPROFLOXACIN >=4 RESISTANT Resistant     GENTAMICIN <=1 SENSITIVE Sensitive     IMIPENEM 2 SENSITIVE Sensitive     NITROFURANTOIN 128 RESISTANT Resistant     TRIMETH/SULFA >=320 RESISTANT Resistant     AMPICILLIN/SULBACTAM <=2 SENSITIVE Sensitive     PIP/TAZO <=4 SENSITIVE Sensitive     * >=100,000 COLONIES/mL PROTEUS MIRABILIS  Blood Culture (routine x 2)     Status: None (Preliminary result)   Collection Time: 07/22/2021 10:18 AM   Specimen: BLOOD LEFT HAND  Result Value Ref Range Status   Specimen Description BLOOD LEFT HAND  Final   Special Requests   Final    BOTTLES DRAWN AEROBIC AND ANAEROBIC Blood Culture  results may not be optimal due to an inadequate volume of blood received in culture bottles   Culture   Final    NO GROWTH 2 DAYS Performed at [facility], [address] [place], [place] [address]    Report Status PENDING  Incomplete  Blood Culture (routine x 2)     Status: Abnormal   Collection Time: 06/30/2021 10:18 AM   Specimen: BLOOD RIGHT HAND  Result Value Ref Range Status   Specimen Description BLOOD RIGHT HAND  Final   Special Requests   Final    BOTTLES DRAWN AEROBIC ONLY Blood Culture results may not be optimal due to an inadequate volume of blood received in culture bottles   Culture  Setup Time   Final    GRAM NEGATIVE RODS IN BOTH AEROBIC AND ANAEROBIC BOTTLES CRITICAL RESULT CALLED TO, READ BACK BY AND VERIFIED WITH: PHARMD L.DANG ON 07/27/2021 BY D.VANHOOK. Performed at [facility], [address] [place], [place] [address]    Culture PROTEUS MIRABILIS (A)  Final   Report Status 07-02-2021 FINAL  Final   Organism ID, Bacteria PROTEUS MIRABILIS  Final      Susceptibility   Proteus mirabilis - MIC*    AMPICILLIN <=2 SENSITIVE Sensitive     CEFAZOLIN <=4 SENSITIVE Sensitive     CEFEPIME <=0.12 SENSITIVE Sensitive     CEFTAZIDIME <=1 SENSITIVE Sensitive     CEFTRIAXONE <=0.25 SENSITIVE Sensitive     CIPROFLOXACIN >=4 RESISTANT Resistant     GENTAMICIN <=1 SENSITIVE Sensitive     IMIPENEM 2 SENSITIVE Sensitive     TRIMETH/SULFA 40 SENSITIVE Sensitive     AMPICILLIN/SULBACTAM <=2 SENSITIVE Sensitive     PIP/TAZO <=4 SENSITIVE Sensitive     * PROTEUS MIRABILIS  Blood Culture ID Panel (Reflexed)  Status: Abnormal   Collection Time: 07/28/2021 10:18 AM  Result Value Ref Range Status   Enterococcus faecalis NOT DETECTED NOT DETECTED Final   Enterococcus Faecium NOT DETECTED NOT DETECTED Final   Listeria monocytogenes NOT DETECTED NOT DETECTED Final   Staphylococcus species NOT DETECTED NOT DETECTED Final   Staphylococcus aureus (BCID) NOT DETECTED NOT  DETECTED Final   Staphylococcus epidermidis NOT DETECTED NOT DETECTED Final   Staphylococcus lugdunensis NOT DETECTED NOT DETECTED Final   Streptococcus species NOT DETECTED NOT DETECTED Final   Streptococcus agalactiae NOT DETECTED NOT DETECTED Final   Streptococcus pneumoniae NOT DETECTED NOT DETECTED Final   Streptococcus pyogenes NOT DETECTED NOT DETECTED Final   A.calcoaceticus-baumannii NOT DETECTED NOT DETECTED Final   Bacteroides fragilis NOT DETECTED NOT DETECTED Final   Enterobacterales DETECTED (A) NOT DETECTED Final    Comment: Enterobacterales represent a large order of gram negative bacteria, not a single organism. CRITICAL RESULT CALLED TO, READ BACK BY AND VERIFIED WITH: L. DANG PHARMD, AT 1740 07/04/2021 D. VANHOOK    Enterobacter cloacae complex NOT DETECTED NOT DETECTED Final   Escherichia coli NOT DETECTED NOT DETECTED Final   Klebsiella aerogenes NOT DETECTED NOT DETECTED Final   Klebsiella oxytoca NOT DETECTED NOT DETECTED Final   Klebsiella pneumoniae NOT DETECTED NOT DETECTED Final   Proteus species DETECTED (A) NOT DETECTED Final    Comment: CRITICAL RESULT CALLED TO, READ BACK BY AND VERIFIED WITH: L. DANG PHARMD, AT 1740 06/30/2021 D. VANHOOK    Salmonella species NOT DETECTED NOT DETECTED Final   Serratia marcescens NOT DETECTED NOT DETECTED Final   Haemophilus influenzae NOT DETECTED NOT DETECTED Final   Neisseria meningitidis NOT DETECTED NOT DETECTED Final   Pseudomonas aeruginosa NOT DETECTED NOT DETECTED Final   Stenotrophomonas maltophilia NOT DETECTED NOT DETECTED Final   Candida albicans NOT DETECTED NOT DETECTED Final   Candida auris NOT DETECTED NOT DETECTED Final   Candida glabrata NOT DETECTED NOT DETECTED Final   Candida krusei NOT DETECTED NOT DETECTED Final   Candida parapsilosis NOT DETECTED NOT DETECTED Final   Candida tropicalis NOT DETECTED NOT DETECTED Final   Cryptococcus neoformans/gattii NOT DETECTED NOT DETECTED Final   CTX-M ESBL NOT  DETECTED NOT DETECTED Final   Carbapenem resistance IMP NOT DETECTED NOT DETECTED Final   Carbapenem resistance KPC NOT DETECTED NOT DETECTED Final   Carbapenem resistance NDM NOT DETECTED NOT DETECTED Final   Carbapenem resist OXA 48 LIKE NOT DETECTED NOT DETECTED Final   Carbapenem resistance VIM NOT DETECTED NOT DETECTED Final    Comment: Performed at [facility], [address], [place], [place] [address]  Culture, Respiratory w Gram Stain     Status: None (Preliminary result)   Collection Time: 07/06/2021 12:30 PM   Specimen: Tracheal Aspirate; Respiratory  Result Value Ref Range Status   Specimen Description TRACHEAL ASPIRATE  Final   Special Requests NONE  Final   Gram Stain   Final    FEW WBC PRESENT,BOTH PMN AND MONONUCLEAR FEW YEAST WITH PSEUDOHYPHAE    Culture   Final    FEW KLEBSIELLA PNEUMONIAE SUSCEPTIBILITIES TO FOLLOW Performed at [facility], [address], [place], [place] [address]    Report Status PENDING  Incomplete    Lab Basic Metabolic Panel: Recent Labs  Lab 07/01/2021 2241 07/23/2021 2243 06/30/2021 0228 07/01/2021 0411 07/21/2021 1478 07/01/2021 2956 07/28/2021 1402 07/09/21 0037 07/09/21 2100 07/09/21 2354 August 06, 2021 0418 2021/08/06 0640  NA 152*   < > 144 146*   < >  146*  146*   < > 159* 159* 158* 160* 157*  K 5.8*   < > 4.2 4.1   < > 4.3  4.5   < > 3.6 3.0* 3.1* 3.3* 3.4*  CL 117*  --  111 111  --  111  --  115*  --  118*  --   --   CO2 13*  --  21* 22  --   --   --  24  --  24  --   --   GLUCOSE 213*  --  184* 144*  --  101*  --  181*  --  98  --   --   BUN 82*  --  68* 67*  --  74*  --  42*  --  35*  --   --   CREATININE 5.44*   < > 3.99* 3.83*  --  3.20*  --  2.24*  --  1.73*  --   --   CALCIUM 9.0  --  8.0* 8.3*  --   --   --  8.7*  --  7.4*  --   --   MG  --   --   --  1.6*  --   --   --   --   --   --   --   --   PHOS  --   --   --  3.8  --   --   --   --   --   --   --   --    < > = values in this interval not displayed.   Liver  Function Tests: Recent Labs  Lab 07/15/2021 2241 07/09/21 2354  AST 43* 183*  ALT 37 65*  ALKPHOS 61 57  BILITOT 0.6 2.3*  PROT 6.7 4.7*  ALBUMIN 2.6* 2.0*   No results for input(s): "LIPASE", "AMYLASE" in the last 168 hours. No results for input(s): "AMMONIA" in the last 168 hours. CBC: Recent Labs  Lab July 15, 2021 2241 07-15-2021 2243 07/03/2021 0455 07/05/2021 0536 07/01/2021 1327 07/09/2021 1402 07/09/21 0012 07/09/21 0434 07/09/21 2100 07/28/2021 0310 07/17/2021 0418 07/19/2021 0640  WBC 8.9   < > 1.0*  --  1.7*  --  2.7* 2.9*  --  9.3  --   --   NEUTROABS 6.8  --   --   --   --   --  1.6*  --   --   --   --   --   HGB 12.0   < > 9.6*   < > 8.7*   < > 10.7* 10.0* 9.9* 9.8* 9.5* 9.5*  HCT 41.0   < > 29.8*   < > 27.4*   < > 33.6* 32.2* 29.0* 31.8* 28.0* 28.0*  MCV 104.9*   < > 96.1  --  95.8  --  94.4 95.0  --  97.5  --   --   PLT 225   < > 136*  --  125*  --  108* 108*  --  81*  --   --    < > = values in this interval not displayed.   Cardiac Enzymes: No results for input(s): "CKTOTAL", "CKMB", "CKMBINDEX", "TROPONINI" in the last 168 hours. Sepsis Labs: Recent Labs  Lab 07/11/2021 0352 07/15/2021 0455 07/27/2021 0921 07/28/2021 1327 07/09/21 0012 07/09/21 0434 07/09/21 2058 07/09/21 2354 07/15/2021 0310  WBC  --    < >  --  1.7* 2.7* 2.9*  --   --  9.3  LATICACIDVEN 6.9*  --  >9.0*  --   --   --  8.8* >9.0*  --    < > = values in this interval not displayed.    Procedures/Operations  Ex lap with lysis of adhesion   Marinda Tyer 07/11/2021, 10:53 AM

## 2021-07-29 NOTE — Progress Notes (Signed)
Patient ID: Rachel Vang, female   DOB: [birth year], 56 y.o.   MRN: [id number] [facility] Surgery Progress Note  2 Days Post-Op  Subjective: Family at bedside.  Patient maxed out on 4 pressors.    Objective: Vital signs in last 24 hours: Temp:  [100.6 F (38.1 C)-104.7 F (40.4 C)] 102.6 F (39.2 C) (06/12 0830) Pulse Rate:  [67-162] 139 (06/12 0800) Resp:  [0-40] 28 (06/12 0830) BP: (51-126)/(13-106) 117/79 (06/12 0830) SpO2:  [63 %-93 %] 69 % (06/12 0800) Arterial Line BP: (62-212)/(33-88) 94/78 (06/12 0830) FiO2 (%):  [100 %] 100 % (06/12 0800) Weight:  [76.7 kg] 76.7 kg (06/12 0500) Last BM Date : 07/09/21  Intake/Output from previous day: 06/11 0701 - 06/12 0700 In: 9824.2 [I.V.:8654.1; IV Piggyback:1170.2] Out: 6283 [Urine:8015; Drains:850] Intake/Output this shift: Total I/O In: 428.7 [I.V.:366.5; IV Piggyback:62.2] Out: 400 [Urine:400]  PE: Gen:  critically ill Abd: distended, PEG to gravity drainage with black output as well as clear water appearing output, vac with good seal and small amount of serosanguinous fluid in cannister  Lab Results:  Recent Labs    07/09/21 0434 07/09/21 2100 07/18/2021 0310 2021-07-18 0418 07-18-2021 0640  WBC 2.9*  --  9.3  --   --   HGB 10.0*   < > 9.8* 9.5* 9.5*  HCT 32.2*   < > 31.8* 28.0* 28.0*  PLT 108*  --  81*  --   --    < > = values in this interval not displayed.   BMET Recent Labs    07/09/21 0037 07/09/21 2100 07/09/21 2354 2021-07-18 0418 2021-07-18 0640  NA 159*   < > 158* 160* 157*  K 3.6   < > 3.1* 3.3* 3.4*  CL 115*  --  118*  --   --   CO2 24  --  24  --   --   GLUCOSE 181*  --  98  --   --   BUN 42*  --  35*  --   --   CREATININE 2.24*  --  1.73*  --   --   CALCIUM 8.7*  --  7.4*  --   --    < > = values in this interval not displayed.   PT/INR Recent Labs    07/26/2021 2305  LABPROT 27.6*  INR 2.6*   CMP     Component Value Date/Time   NA 157 (H) July 18, 2021 0640   NA 138 06/28/2020  1230   K 3.4 (L) 07-18-21 0640   CL 118 (H) 07/09/2021 2354   CO2 24 07/09/2021 2354   GLUCOSE 98 07/09/2021 2354   BUN 35 (H) 07/09/2021 2354   BUN 21 06/28/2020 1230   CREATININE 1.73 (H) 07/09/2021 2354   CALCIUM 7.4 (L) 07/09/2021 2354   PROT 4.7 (L) 07/09/2021 2354   ALBUMIN 2.0 (L) 07/09/2021 2354   AST 183 (H) 07/09/2021 2354   ALT 65 (H) 07/09/2021 2354   ALKPHOS 57 07/09/2021 2354   BILITOT 2.3 (H) 07/09/2021 2354   GFRNONAA 34 (L) 07/09/2021 2354   GFRAA >60 09/08/2017 0520   Lipase     Component Value Date/Time   LIPASE 62 (H) 12/11/2020 2254       Studies/Results: DG CHEST PORT 1 VIEW  Result Date: 07-18-2021 CLINICAL DATA:  Respiratory failure EXAM: PORTABLE CHEST 1 VIEW COMPARISON:  Yesterday FINDINGS: Endotracheal tube with tip between the clavicular heads and carina. Left IJ central line with tip at the upper  cavoatrial junction. New extensive pneumomediastinum and chest wall emphysema. No detected pneumothorax. Similar pattern of extensive bilateral pneumonia. Normal heart size. Findings called to Nadeen Landaun Michelle, findings were expected based on exam. IMPRESSION: 1. New extensive pneumomediastinum and chest wall emphysema. No visible pneumothorax. 2. Unchanged multifocal pneumonia. 3. Unchanged hardware positioning. Electronically Signed   By: Tiburcio PeaJonathan  Watts M.D.   On: 07/09/2021 06:36   DG CHEST PORT 1 VIEW  Result Date: 07/09/2021 CLINICAL DATA:  Respiratory failure. EXAM: PORTABLE CHEST 1 VIEW COMPARISON:  Chest radiograph May 08, 2021 FINDINGS: Endotracheal tube terminates 3 cm above the carina. Left jugular catheter terminates over the lower SVC. The cardiomediastinal silhouette is unchanged with normal heart size. Widespread patchy and confluent airspace opacities in both lungs have not significantly changed. No sizable pleural effusion or pneumothorax is identified. IMPRESSION: Unchanged extensive bilateral airspace disease. Electronically Signed   By: Sebastian AcheAllen   Grady M.D.   On: 07/09/2021 09:51   DG Chest Port 1 View  Result Date: 05-20-2021 CLINICAL DATA:  Acute respiratory failure, hypoxia EXAM: PORTABLE CHEST 1 VIEW COMPARISON:  May 08, 2021 FINDINGS: Patchy bilateral airspace disease again noted, worsening in the right lower lobe since prior study, stable otherwise. Heart is normal size. No effusions or pneumothorax. Endotracheal tube, left central line remain in place, unchanged. IMPRESSION: Bilateral patchy airspace disease, worsening on the right since prior study. Electronically Signed   By: Charlett NoseKevin  Dover M.D.   On: May 08, 2021 23:14   DG CHEST PORT 1 VIEW  Result Date: 05-20-2021 CLINICAL DATA:  Respiratory failure EXAM: PORTABLE CHEST 1 VIEW COMPARISON:  07/14/2021 FINDINGS: Worsening patchy bilateral airspace disease concerning for pneumonia. Heart is normal size. Endotracheal tube and left central line remain in place, unchanged. No visible effusions or pneumothorax. IMPRESSION: Worsening patchy bilateral airspace disease concerning for pneumonia. Electronically Signed   By: Charlett NoseKevin  Dover M.D.   On: May 08, 2021 19:32   CT ABDOMEN PELVIS WO CONTRAST  Result Date: 05-20-2021 CLINICAL DATA:  Bowel obstruction suspected.  Sepsis. EXAM: CT ABDOMEN AND PELVIS WITHOUT CONTRAST TECHNIQUE: Multidetector CT imaging of the abdomen and pelvis was performed following the standard protocol without IV contrast. RADIATION DOSE REDUCTION: This exam was performed according to the departmental dose-optimization program which includes automated exposure control, adjustment of the mA and/or kV according to patient size and/or use of iterative reconstruction technique. COMPARISON:  Earlier the same day FINDINGS: Lower chest: Extensive bilateral pneumonia with progression from before. Extensive coronary atherosclerosis. Hepatobiliary: No focal liver abnormality.No evidence of biliary obstruction or stone. Pancreas: Generalized atrophy Spleen: Unremarkable. Adrenals/Urinary  Tract: Negative adrenals. Improvement in bilateral hydronephrosis after bladder decompression. Stomach/Bowel: Persisting small bowel dilatation above a transition point where there is twisting of the due to twist in the small bowel mesentery. A loop just before the twist appears more thick walled and dense both at the level of the bowel wall in luminal contents. No perforation at this time. Located peg. The stomach has been partially decompressed. Vascular/Lymphatic: Atherosclerosis affecting the aorta, iliacs, and mesenteric branches. No mass or adenopathy. Reproductive:Hysterectomy Other: Retroperitoneal edema which may be from third spacing in this septic patient. Musculoskeletal: No acute abnormalities. Case reviewed with Dr. Andrey CampanileWilson via telephone IMPRESSION: 1. Continued small bowel obstruction with underlying mesenteric twisting/volvulus appearance. The loop before the transition point is becoming thicker, possible sign of vessel compromise. No pneumatosis or perforation. 2. Worsening of widespread pneumonia. 3. Advanced atherosclerosis. 4. Decompression of the urinary bladder with improved hydronephrosis. Electronically Signed   By: Audry RilesJonathan  Watts M.D.  On: 07/12/2021 12:55    Anti-infectives: Anti-infectives (From admission, onward)    Start     Dose/Rate Route Frequency Ordered Stop   07/09/21 0745  vancomycin (VANCOCIN) IVPB 1000 mg/200 mL premix        1,000 mg 200 mL/hr over 60 Minutes Intravenous  Once 07/09/21 0650 07/09/21 1123   07/09/21 0045  micafungin (MYCAMINE) 100 mg in sodium chloride 0.9 % 100 mL IVPB       Note to Pharmacy: Pharmacy may modify dose for renal dysfunction.   100 mg 105 mL/hr over 1 Hours Intravenous Daily at 10 pm 07/09/21 0026 07/15/21 2159   07/09/21 0019  vancomycin variable dose per unstable renal function (pharmacist dosing)         Does not apply See admin instructions 07/09/21 0020     07/25/2021 2200  piperacillin-tazobactam (ZOSYN) IVPB 3.375 g         3.375 g 12.5 mL/hr over 240 Minutes Intravenous Every 8 hours 07/26/2021 1215     07/02/2021 1000  piperacillin-tazobactam (ZOSYN) IVPB 3.375 g  Status:  Discontinued        3.375 g 12.5 mL/hr over 240 Minutes Intravenous Every 12 hours 07/14/2021 0101 07/13/2021 1215   07/27/2021 0101  vancomycin variable dose per unstable renal function (pharmacist dosing)  Status:  Discontinued         Does not apply See admin instructions 06/29/2021 0101 07/11/2021 1750   07/09/2021 2300  piperacillin-tazobactam (ZOSYN) IVPB 3.375 g        3.375 g 100 mL/hr over 30 Minutes Intravenous  Once 07/01/2021 2253 07/21/2021 2336   07/22/2021 2230  ceFEPIme (MAXIPIME) 2 g in sodium chloride 0.9 % 100 mL IVPB  Status:  Discontinued        2 g 200 mL/hr over 30 Minutes Intravenous  Once 07/09/2021 2215 07/25/2021 2253   07/25/2021 2230  metroNIDAZOLE (FLAGYL) IVPB 500 mg  Status:  Discontinued        500 mg 100 mL/hr over 60 Minutes Intravenous  Once 07/17/2021 2215 07/23/2021 2253   07/04/2021 2230  vancomycin (VANCOCIN) IVPB 1000 mg/200 mL premix        1,000 mg 200 mL/hr over 60 Minutes Intravenous  Once 07/02/2021 2215 07/20/2021 0045        Assessment/Plan Septic shock SBO/closed loop obstruction POD#2 s/p ex lap, lysis of adhesions, application Abthera wound vac 6/10 Dr. Andrey Campanile - Continue PEG to gravity - patient too unstable to return to OR -patient's prognosis is dismal.  Do not anticipate she will improve enough to return to OR at all. -CCM working on code status and GOC.   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

## 2021-07-29 NOTE — Progress Notes (Signed)
Patient passed peacefully at 26 with family at her bedside. No heart sounds auscultated by myself and Gena Fray and asytole on monitor. Dr Merrily Pew was informed

## 2021-07-29 DEATH — deceased
